# Patient Record
Sex: Male | Born: 1965 | Race: Black or African American | Hispanic: No | Smoking: Never smoker
Health system: Southern US, Community
[De-identification: ages and names within clinical notes are randomized; demographics above are authoritative.]

## PROBLEM LIST (undated history)

## (undated) DIAGNOSIS — I471 Supraventricular tachycardia, unspecified: Secondary | ICD-10-CM

## (undated) DIAGNOSIS — I428 Other cardiomyopathies: Secondary | ICD-10-CM

## (undated) DIAGNOSIS — I82409 Acute embolism and thrombosis of unspecified deep veins of unspecified lower extremity: Secondary | ICD-10-CM

## (undated) DIAGNOSIS — I5042 Chronic combined systolic (congestive) and diastolic (congestive) heart failure: Secondary | ICD-10-CM

## (undated) DIAGNOSIS — Z6841 Body Mass Index (BMI) 40.0 and over, adult: Secondary | ICD-10-CM

## (undated) DIAGNOSIS — S31109A Unspecified open wound of abdominal wall, unspecified quadrant without penetration into peritoneal cavity, initial encounter: Secondary | ICD-10-CM

## (undated) DIAGNOSIS — I513 Intracardiac thrombosis, not elsewhere classified: Secondary | ICD-10-CM

## (undated) DIAGNOSIS — I872 Venous insufficiency (chronic) (peripheral): Secondary | ICD-10-CM

## (undated) DIAGNOSIS — I472 Ventricular tachycardia, unspecified: Secondary | ICD-10-CM

## (undated) DIAGNOSIS — I2510 Atherosclerotic heart disease of native coronary artery without angina pectoris: Secondary | ICD-10-CM

## (undated) DIAGNOSIS — I2699 Other pulmonary embolism without acute cor pulmonale: Secondary | ICD-10-CM

## (undated) DIAGNOSIS — K5792 Diverticulitis of intestine, part unspecified, without perforation or abscess without bleeding: Secondary | ICD-10-CM

## (undated) DIAGNOSIS — J189 Pneumonia, unspecified organism: Secondary | ICD-10-CM

## (undated) DIAGNOSIS — K759 Inflammatory liver disease, unspecified: Secondary | ICD-10-CM

## (undated) DIAGNOSIS — M1990 Unspecified osteoarthritis, unspecified site: Secondary | ICD-10-CM

## (undated) DIAGNOSIS — Z973 Presence of spectacles and contact lenses: Secondary | ICD-10-CM

## (undated) DIAGNOSIS — R760 Raised antibody titer: Secondary | ICD-10-CM

## (undated) HISTORY — DX: Other pulmonary embolism without acute cor pulmonale: I26.99

## (undated) HISTORY — DX: Intracardiac thrombosis, not elsewhere classified: I51.3

## (undated) HISTORY — DX: Unspecified osteoarthritis, unspecified site: M19.90

## (undated) HISTORY — PX: JOINT REPLACEMENT: SHX530

## (undated) HISTORY — PX: PATELLAR TENDON REPAIR: SHX737

---

## 2005-01-13 DIAGNOSIS — I82409 Acute embolism and thrombosis of unspecified deep veins of unspecified lower extremity: Secondary | ICD-10-CM

## 2005-01-13 HISTORY — DX: Acute embolism and thrombosis of unspecified deep veins of unspecified lower extremity: I82.409

## 2005-08-31 ENCOUNTER — Emergency Department (HOSPITAL_COMMUNITY): Admission: EM | Admit: 2005-08-31 | Discharge: 2005-08-31 | Payer: Self-pay | Admitting: Emergency Medicine

## 2005-09-03 ENCOUNTER — Encounter: Admission: RE | Admit: 2005-09-03 | Discharge: 2005-09-03 | Payer: Self-pay | Admitting: Orthopedic Surgery

## 2005-09-09 ENCOUNTER — Ambulatory Visit (HOSPITAL_COMMUNITY): Admission: RE | Admit: 2005-09-09 | Discharge: 2005-09-09 | Payer: Self-pay | Admitting: Orthopedic Surgery

## 2005-11-14 ENCOUNTER — Emergency Department (HOSPITAL_COMMUNITY): Admission: EM | Admit: 2005-11-14 | Discharge: 2005-11-14 | Payer: Self-pay | Admitting: Emergency Medicine

## 2005-11-14 ENCOUNTER — Ambulatory Visit (HOSPITAL_COMMUNITY): Admission: RE | Admit: 2005-11-14 | Discharge: 2005-11-14 | Payer: Self-pay

## 2005-11-14 ENCOUNTER — Encounter (INDEPENDENT_AMBULATORY_CARE_PROVIDER_SITE_OTHER): Payer: Self-pay | Admitting: *Deleted

## 2005-11-17 ENCOUNTER — Ambulatory Visit: Payer: Self-pay | Admitting: Family Medicine

## 2005-12-05 ENCOUNTER — Ambulatory Visit: Payer: Self-pay | Admitting: Family Medicine

## 2005-12-15 ENCOUNTER — Ambulatory Visit: Payer: Self-pay | Admitting: Family Medicine

## 2005-12-18 ENCOUNTER — Inpatient Hospital Stay (HOSPITAL_COMMUNITY): Admission: AD | Admit: 2005-12-18 | Discharge: 2005-12-25 | Payer: Self-pay | Admitting: Internal Medicine

## 2005-12-18 ENCOUNTER — Ambulatory Visit: Payer: Self-pay | Admitting: Cardiology

## 2005-12-18 ENCOUNTER — Ambulatory Visit: Payer: Self-pay | Admitting: Family Medicine

## 2005-12-19 ENCOUNTER — Encounter: Payer: Self-pay | Admitting: Cardiology

## 2005-12-29 ENCOUNTER — Ambulatory Visit: Payer: Self-pay | Admitting: Family Medicine

## 2006-01-05 ENCOUNTER — Ambulatory Visit: Payer: Self-pay | Admitting: Family Medicine

## 2006-01-26 ENCOUNTER — Ambulatory Visit: Payer: Self-pay | Admitting: Family Medicine

## 2006-03-04 ENCOUNTER — Ambulatory Visit: Payer: Self-pay | Admitting: Family Medicine

## 2006-04-07 ENCOUNTER — Ambulatory Visit: Payer: Self-pay | Admitting: Family Medicine

## 2006-05-07 ENCOUNTER — Ambulatory Visit: Payer: Self-pay | Admitting: Family Medicine

## 2006-05-18 ENCOUNTER — Ambulatory Visit: Payer: Self-pay | Admitting: Cardiology

## 2006-06-14 HISTORY — PX: CARDIAC CATHETERIZATION: SHX172

## 2006-06-15 ENCOUNTER — Ambulatory Visit: Payer: Self-pay | Admitting: Family Medicine

## 2006-06-17 ENCOUNTER — Ambulatory Visit: Payer: Self-pay

## 2006-06-17 ENCOUNTER — Encounter: Payer: Self-pay | Admitting: Cardiology

## 2006-06-19 ENCOUNTER — Ambulatory Visit: Payer: Self-pay

## 2006-06-30 ENCOUNTER — Ambulatory Visit: Payer: Self-pay | Admitting: Cardiology

## 2006-06-30 LAB — CONVERTED CEMR LAB
Basophils Relative: 0.6 % (ref 0.0–1.0)
CO2: 29 meq/L (ref 19–32)
Creatinine, Ser: 0.9 mg/dL (ref 0.4–1.5)
Glucose, Bld: 91 mg/dL (ref 70–99)
HCT: 45.1 % (ref 39.0–52.0)
Hemoglobin: 15 g/dL (ref 13.0–17.0)
INR: 3.4 — ABNORMAL HIGH (ref 0.9–2.0)
MCHC: 33.3 g/dL (ref 30.0–36.0)
Monocytes Absolute: 0.6 10*3/uL (ref 0.2–0.7)
Neutrophils Relative %: 65.9 % (ref 43.0–77.0)
Potassium: 3.9 meq/L (ref 3.5–5.1)
Prothrombin Time: 23.5 s — ABNORMAL HIGH (ref 10.0–14.0)
RDW: 12.9 % (ref 11.5–14.6)
Sodium: 140 meq/L (ref 135–145)

## 2006-07-06 ENCOUNTER — Inpatient Hospital Stay (HOSPITAL_BASED_OUTPATIENT_CLINIC_OR_DEPARTMENT_OTHER): Admission: RE | Admit: 2006-07-06 | Discharge: 2006-07-06 | Payer: Self-pay | Admitting: Cardiology

## 2006-07-06 ENCOUNTER — Ambulatory Visit: Payer: Self-pay | Admitting: Cardiology

## 2006-07-10 ENCOUNTER — Ambulatory Visit: Payer: Self-pay | Admitting: Family Medicine

## 2006-07-21 ENCOUNTER — Ambulatory Visit: Payer: Self-pay | Admitting: Family Medicine

## 2006-08-21 ENCOUNTER — Ambulatory Visit: Payer: Self-pay | Admitting: Family Medicine

## 2006-11-03 ENCOUNTER — Ambulatory Visit: Payer: Self-pay | Admitting: Family Medicine

## 2006-11-09 ENCOUNTER — Ambulatory Visit: Payer: Self-pay | Admitting: Family Medicine

## 2006-12-03 ENCOUNTER — Ambulatory Visit: Payer: Self-pay | Admitting: Family Medicine

## 2006-12-04 ENCOUNTER — Ambulatory Visit: Payer: Self-pay

## 2007-05-28 ENCOUNTER — Ambulatory Visit: Payer: Self-pay | Admitting: Cardiology

## 2007-06-02 ENCOUNTER — Ambulatory Visit: Payer: Self-pay

## 2007-06-02 ENCOUNTER — Encounter: Payer: Self-pay | Admitting: Cardiology

## 2007-07-07 ENCOUNTER — Ambulatory Visit: Payer: Self-pay | Admitting: Family Medicine

## 2008-02-21 ENCOUNTER — Ambulatory Visit: Payer: Self-pay | Admitting: Family Medicine

## 2008-03-20 ENCOUNTER — Ambulatory Visit: Payer: Self-pay | Admitting: Family Medicine

## 2008-04-20 ENCOUNTER — Ambulatory Visit: Payer: Self-pay | Admitting: Family Medicine

## 2008-06-02 DIAGNOSIS — I2699 Other pulmonary embolism without acute cor pulmonale: Secondary | ICD-10-CM

## 2008-06-02 DIAGNOSIS — I471 Supraventricular tachycardia, unspecified: Secondary | ICD-10-CM | POA: Insufficient documentation

## 2008-06-22 ENCOUNTER — Ambulatory Visit: Payer: Self-pay | Admitting: Family Medicine

## 2008-07-21 ENCOUNTER — Ambulatory Visit: Payer: Self-pay | Admitting: Cardiology

## 2008-07-21 DIAGNOSIS — R002 Palpitations: Secondary | ICD-10-CM

## 2008-07-24 LAB — CONVERTED CEMR LAB
BUN: 14 mg/dL (ref 6–23)
Basophils Relative: 0.4 % (ref 0.0–3.0)
Chloride: 102 meq/L (ref 96–112)
Creatinine, Ser: 1 mg/dL (ref 0.4–1.5)
Eosinophils Absolute: 0.2 10*3/uL (ref 0.0–0.7)
Glucose, Bld: 109 mg/dL — ABNORMAL HIGH (ref 70–99)
Hemoglobin: 14.2 g/dL (ref 13.0–17.0)
Lymphocytes Relative: 31.5 % (ref 12.0–46.0)
MCHC: 34.1 g/dL (ref 30.0–36.0)
MCV: 87.3 fL (ref 78.0–100.0)
Monocytes Absolute: 0.7 10*3/uL (ref 0.1–1.0)
Neutro Abs: 4.1 10*3/uL (ref 1.4–7.7)
RBC: 4.76 M/uL (ref 4.22–5.81)

## 2008-07-25 ENCOUNTER — Encounter: Payer: Self-pay | Admitting: Cardiology

## 2008-07-25 ENCOUNTER — Ambulatory Visit: Payer: Self-pay

## 2008-09-06 ENCOUNTER — Telehealth (INDEPENDENT_AMBULATORY_CARE_PROVIDER_SITE_OTHER): Payer: Self-pay | Admitting: *Deleted

## 2008-10-30 ENCOUNTER — Inpatient Hospital Stay (HOSPITAL_COMMUNITY): Admission: EM | Admit: 2008-10-30 | Discharge: 2008-11-02 | Payer: Self-pay | Admitting: Emergency Medicine

## 2008-11-06 ENCOUNTER — Ambulatory Visit: Payer: Self-pay | Admitting: Family Medicine

## 2008-11-09 ENCOUNTER — Ambulatory Visit: Payer: Self-pay | Admitting: Cardiology

## 2008-11-29 ENCOUNTER — Ambulatory Visit: Payer: Self-pay | Admitting: Internal Medicine

## 2008-11-29 DIAGNOSIS — R0609 Other forms of dyspnea: Secondary | ICD-10-CM

## 2008-11-29 DIAGNOSIS — R0989 Other specified symptoms and signs involving the circulatory and respiratory systems: Secondary | ICD-10-CM

## 2008-12-08 ENCOUNTER — Ambulatory Visit (HOSPITAL_BASED_OUTPATIENT_CLINIC_OR_DEPARTMENT_OTHER): Admission: RE | Admit: 2008-12-08 | Discharge: 2008-12-08 | Payer: Self-pay | Admitting: Internal Medicine

## 2008-12-08 ENCOUNTER — Encounter: Payer: Self-pay | Admitting: Internal Medicine

## 2008-12-12 ENCOUNTER — Ambulatory Visit: Payer: Self-pay | Admitting: Pulmonary Disease

## 2009-04-13 ENCOUNTER — Ambulatory Visit: Payer: Self-pay | Admitting: Family Medicine

## 2009-05-02 ENCOUNTER — Ambulatory Visit: Payer: Self-pay | Admitting: Family Medicine

## 2009-05-02 ENCOUNTER — Encounter: Admission: RE | Admit: 2009-05-02 | Discharge: 2009-05-02 | Payer: Self-pay | Admitting: Family Medicine

## 2009-05-22 ENCOUNTER — Telehealth: Payer: Self-pay | Admitting: Cardiology

## 2009-05-24 ENCOUNTER — Telehealth: Payer: Self-pay | Admitting: Cardiology

## 2009-06-13 ENCOUNTER — Telehealth: Payer: Self-pay | Admitting: Cardiology

## 2009-06-14 ENCOUNTER — Encounter: Admission: RE | Admit: 2009-06-14 | Discharge: 2009-06-14 | Payer: Self-pay | Admitting: Family Medicine

## 2009-06-14 ENCOUNTER — Ambulatory Visit: Payer: Self-pay | Admitting: Family Medicine

## 2009-07-13 ENCOUNTER — Ambulatory Visit: Payer: Self-pay | Admitting: Family Medicine

## 2009-08-07 ENCOUNTER — Ambulatory Visit: Payer: Self-pay | Admitting: Cardiology

## 2009-08-13 ENCOUNTER — Ambulatory Visit (HOSPITAL_COMMUNITY): Admission: RE | Admit: 2009-08-13 | Discharge: 2009-08-13 | Payer: Self-pay | Admitting: Cardiology

## 2009-08-13 ENCOUNTER — Ambulatory Visit: Payer: Self-pay

## 2009-08-13 ENCOUNTER — Ambulatory Visit: Payer: Self-pay | Admitting: Internal Medicine

## 2009-08-16 ENCOUNTER — Ambulatory Visit: Payer: Self-pay | Admitting: Family Medicine

## 2009-08-22 ENCOUNTER — Ambulatory Visit: Payer: Self-pay | Admitting: Family Medicine

## 2009-09-21 ENCOUNTER — Ambulatory Visit: Payer: Self-pay | Admitting: Family Medicine

## 2009-10-16 ENCOUNTER — Ambulatory Visit: Payer: Self-pay | Admitting: Family Medicine

## 2009-12-17 ENCOUNTER — Ambulatory Visit: Payer: Self-pay | Admitting: Family Medicine

## 2009-12-17 ENCOUNTER — Telehealth (INDEPENDENT_AMBULATORY_CARE_PROVIDER_SITE_OTHER): Payer: Self-pay | Admitting: *Deleted

## 2009-12-21 ENCOUNTER — Ambulatory Visit: Payer: Self-pay | Admitting: Cardiology

## 2009-12-21 ENCOUNTER — Encounter: Payer: Self-pay | Admitting: Cardiology

## 2009-12-21 DIAGNOSIS — I238 Other current complications following acute myocardial infarction: Secondary | ICD-10-CM | POA: Insufficient documentation

## 2009-12-24 LAB — CONVERTED CEMR LAB
Basophils Absolute: 0 10*3/uL (ref 0.0–0.1)
Calcium: 8.6 mg/dL (ref 8.4–10.5)
Creatinine, Ser: 1 mg/dL (ref 0.4–1.5)
Eosinophils Absolute: 0.2 10*3/uL (ref 0.0–0.7)
GFR calc non Af Amer: 104.24 mL/min (ref >60.00)
Glucose, Bld: 98 mg/dL (ref 70–99)
Hemoglobin: 13.7 g/dL (ref 13.0–17.0)
Lymphocytes Relative: 25.2 % (ref 12.0–46.0)
Monocytes Relative: 9.2 % (ref 3.0–12.0)
Neutro Abs: 5.3 10*3/uL (ref 1.4–7.7)
Neutrophils Relative %: 63.2 % (ref 43.0–77.0)
Platelets: 151 10*3/uL (ref 150.0–400.0)
RDW: 13.8 % (ref 11.5–14.6)
Sodium: 141 meq/L (ref 135–145)

## 2010-01-01 ENCOUNTER — Ambulatory Visit: Payer: Self-pay | Admitting: Family Medicine

## 2010-01-11 ENCOUNTER — Ambulatory Visit
Admission: RE | Admit: 2010-01-11 | Discharge: 2010-01-11 | Payer: Self-pay | Source: Home / Self Care | Attending: Family Medicine | Admitting: Family Medicine

## 2010-02-12 NOTE — Assessment & Plan Note (Signed)
Summary: 8wk f/u sl   Referring Provider:  Olga Millers, MD Primary Provider:  Sharlot Gowda, MD  CC:  follow up.  History of Present Illness: Roberto [name] is a very pleasant gentleman who I have seen inthe past for cardiomyopathy as well as SVT.  I initially saw h im in December of 2007.  At that time he had had knee surgery and suffered a deep venous thrombosis with a subsequent pulmonary embolus.  While he was in the hospital he had an episode of SVT.  An echocardiogram was performed and his ejection fraction was reduced.  However, we were treating the patient for his pulmonary embolus and did not pursue any further ischemia evaluation at that point.  He did have an outpatient Myoview on June 17, 2006.  His ejection fraction was 32% and there was scar in the anterior wall, apex and inferior wall with mild peri-infarct ischemia. Because of the abnormal Myoview, he ultimately underwent cardiac catheterization, which performed on July 06, 2006.  His coronaries were normal. Last echocardiogram in in August of 2011 revealed an ejection fraction of 45% and apical thrombus; moderate LAE. Previously seen by Roberto [name] for consideration of SVT ablation but elected medical therapy. I last saw him in July of 2011. Since then, the patient denies any dyspnea on exertion, orthopnea, PND,  palpitations, syncope or chest pain. Mild pedal edema.   Current Medications (verified): 1)  Carvedilol 12.5 Mg Tabs (Carvedilol) .... One Twice A Day 2)  Cozaar 25 Mg Tabs (Losartan Potassium) .[name].. 1 Tab By Mouth Once Daily 3)  Furosemide 40 Mg Tabs (Furosemide) .... Take One Tablet By Mouth Daily. 4)  Potassium Chloride Cr 10 Meq Cr-Caps (Potassium Chloride) .... Take One Tablet By Mouth Daily 5)  Warfarin Sodium 10 Mg Tabs (Warfarin Sodium) .... As Directed  Allergies: No Known Drug Allergies  Past History:  Past Medical History: SUPRAVENTRICULAR TACHYCARDIA  Nonischemic CM, NYHA Class I/II CHF Obesity H/o DVT  after knee surgery and subsequent PTE 2007 Apical thrombus    Past Surgical History: Reviewed history from 11/29/2008 and no changes required. L patella repair  Social History: Reviewed history from 11/29/2008 and no changes required. Pt lives in [place].  The patient is married with five offspring.  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  He rarely drinks alcohol.  Review of Systems       no fevers or chills, productive cough, hemoptysis, dysphasia, odynophagia, melena, hematochezia, dysuria, hematuria, rash, seizure activity, orthopnea, PND, pedal edema, claudication. Remaining systems are negative.   Vital Signs:  Patient profile:   45 year old male Height:      68 inches Weight:      338 pounds BMI:     51.58 Pulse rate:   73 / minute Resp:     14 per minute BP sitting:   112 / 80  (left arm)  Vitals Entered By: Roberto [name] (December 21, 2009 8:36 AM)  Physical Exam  General:  Well-developed obese in no acute distress.  Skin is warm and dry.  HEENT is normal.  Neck is supple. No thyromegaly.  Chest is clear to auscultation with normal expansion.  Cardiovascular exam is regular rate and rhythm.  Abdominal exam nontender or distended. No masses palpated. Extremities show 1+ edema. neuro grossly intact    EKG  Procedure date:  12/21/2009  Findings:      Sinus rhythm with left axis  deviation.  Impression & Recommendations:  Problem # 1:  SUPRAVENTRICULAR TACHYCARDIA, HX OF (ICD-V12.59) Pt is not having SVT at present. Continue beta blocker. Will consider referral back to Roberto [name] in the future if his symptoms worsen for ablation. The following medications were removed from the medication list:    Verapamil Hcl 80 Mg Tabs (Verapamil hcl) ..... One by mouth three times a day as needed for svt His updated medication list for this problem includes:    Carvedilol 12.5 Mg Tabs (Carvedilol) .....  One twice a day    Warfarin Sodium 10 Mg Tabs (Warfarin sodium) .[name]... As directed  Problem # 2:  CARDIOMYOPATHY (ICD-425.4) Continue beta blocker, ARB and diuretic. Check potassium and renal function. The following medications were removed from the medication list:    Verapamil Hcl 80 Mg Tabs (Verapamil hcl) ..... One by mouth three times a day as needed for svt His updated medication list for this problem includes:    Carvedilol 12.5 Mg Tabs (Carvedilol) ..... One twice a day    Cozaar 25 Mg Tabs (Losartan potassium) .[name]... 1 tab by mouth once daily    Furosemide 40 Mg Tabs (Furosemide) .[name]... Take one tablet by mouth daily.    Warfarin Sodium 10 Mg Tabs (Warfarin sodium) .[name]... As directed  Problem # 3:  OBESITY, MORBID (ICD-278.01) He has been referred for a nutrition consult.  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 Check CBC.  Other Orders: TLB-BMP (Basic Metabolic Panel-BMET) (80048-METABOL) TLB-CBC Platelet - w/Differential (85025-CBCD)  Patient Instructions: 1)  Your physician recommends that you schedule a follow-up appointment in: 6 months with Dr. [name] 2)  Your physician recommends that you return for lab work in: CBC and BMET today

## 2010-02-12 NOTE — Progress Notes (Signed)
Summary: req appt  Phone Note Call from Patient Call back at [phone]   Caller: Patient Reason for Call: Talk to Nurse Summary of Call: wife has notice some different breathing while sleeping, also some sweating.... request appt asap Initial call taken by: Migdalia Dk,  June 13, 2009 8:06 AM  Follow-up for Phone Call        spoke with pt, he states his wife has noticed that when he is sleeping his breathing is irregular and he has been sweating alot. he also states he has congestion and a dry cough. he denies SOB when lying down or with exerction. he does report some swelling in his feet. he does not weigh daily. appt made with the pa 07/04/09. pt encouraged to call his primary care md because he states he recently had pneumonia but the cough is different than before, it is dry not wet. pt to call with change in symptoms or problems prior to appt. pt voiced understanding Deliah Goody, RN  June 13, 2009 9:15 AM

## 2010-02-12 NOTE — Progress Notes (Signed)
Summary: sob while walking  Phone Note Call from Patient Call back at [facility] Phone [phone]   Caller: Patient Reason for Call: Talk to Nurse Summary of Call: per pt calling c/o sob  while walking. no chestpain. pt on new b/p meds verapamil 80 mg.  Initial call taken by: Lorne Skeens,  May 24, 2009 4:35 PM  Follow-up for Phone Call        had svt on Sunday on Monday, called office and was told to start taking verapamil three times a day, which he started on Tue night, and states he feels it is causing slight sob which he read is a side effect.  Advised pt his directions stated to take as needed for SVt so if he wasn't having symptoms of svt not to take med he is agreeable Meredith Staggers, RN  May 24, 2009 5:49 PM

## 2010-02-12 NOTE — Progress Notes (Signed)
Summary: Faxed records to [facility] at [facility].  Faxed records to [place] at [facility]. LOV, EKG, LABS, & ECHO FAX: [phone] PHONE: [phone] Roberto [name]  December 17, 2009 12:02 PM

## 2010-02-12 NOTE — Assessment & Plan Note (Signed)
Summary: rov/irregular breathing/dm   Visit Type:  Follow-up Referring Provider:  Olga Millers, MD Primary Provider:  Sharlot Gowda, MD   History of Present Illness: Mr. Colley is a very pleasant gentleman who I have seen inthe past for cardiomyopathy as well as SVT.  I initially saw h im in December of 2007.  At that time he had had knee surgery and suffered a deep venous thrombosis with a subsequent pulmonary embolus.  While he was in the hospital he had an episode of SVT.  An echocardiogram was performed and his ejection fraction was reduced.  However, we were treating the patient for his pulmonary embolus and did not pursue any further ischemia evaluation at that point.  He did have an outpatient Myoview on June 17, 2006.  His ejection fraction was 32% and there was scar in the anterior wall, apex and inferior wall with mild peri-infarct ischemia. Because of the abnormal Myoview, he ultimately underwent cardiac catheterization, which performed on July 06, 2006.  His coronaries were normal. Last echocardiogram in in July of 2010 revealed an ejection fraction of 35-40% and mild left atrial enlargement. Note previous echocardiogram in 2008 suggested possible old calcified thrombus.  I last saw him in OCT 2010. He was having symptoms that I felt was consistent with recurrent SVT. I referred him to Dr. Johney Frame for consideration of ablation. However he elected to continue with medical therapy. Since then he continues to have bouts of palpitations. They are sudden in onset and described as his heart racing. They last approximately 15 minutes and resolve spontaneously. He also was recently placed on diuretics were increased shortness of breath. Those symptoms have now improved. He denies any chest pain.  Current Medications (verified): 1)  Carvedilol 6.25 Mg Tabs (Carvedilol) .... Take One Tablet By Mouth Twice A Day 2)  Cozaar 25 Mg Tabs (Losartan Potassium) .[name].. 1 Tab By Mouth Once Daily 3)  Verapamil Hcl  80 Mg Tabs (Verapamil Hcl) .... One By Mouth Three Times A Day As Needed For Svt 4)  Furosemide 40 Mg Tabs (Furosemide) .... Take One Tablet By Mouth Daily. 5)  Potassium Chloride Cr 10 Meq Cr-Caps (Potassium Chloride) .... Take One Tablet By Mouth Daily  Allergies: No Known Drug Allergies  Past History:  Past Medical History: Reviewed history from 11/29/2008 and no changes required. SUPRAVENTRICULAR TACHYCARDIA  Nonischemic CM, NYHA Class I/II CHF Obesity H/o DVT after knee surgery and subsequent PTE 2007    Past Surgical History: Reviewed history from 11/29/2008 and no changes required. L patella repair  Social History: Reviewed history from 11/29/2008 and no changes required. Pt lives in [place].  The patient is married with five offspring.  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  He rarely drinks alcohol.  Review of Systems       no fevers or chills, productive cough, hemoptysis, dysphasia, odynophagia, melena, hematochezia, dysuria, hematuria, rash, seizure activity, orthopnea, PND, pedal edema, claudication. Remaining systems are negative.   Vital Signs:  Patient profile:   45 year old male Height:      68 inches Weight:      322 pounds BMI:     49.14 Pulse rate:   65 / minute BP sitting:   142 / 72  (left arm)  Vitals Entered By: Laurance Flatten CMA (August 07, 2009 10:47 AM)  Physical Exam  General:  Well-developed obese in no acute distress.  Skin is warm and  dry.  HEENT is normal.  Neck is supple. No thyromegaly.  Chest is clear to auscultation with normal expansion.  Cardiovascular exam is regular rate and rhythm.  Abdominal exam nontender or distended. No masses palpated. Extremities show trace edema. neuro grossly intact    EKG  Procedure date:  08/07/2009  Findings:      Sinus rhythm at rate 65. Occasional PVC. Left axis deviation. Biatrial enlargement. Nonspecific ST  changes.  Impression & Recommendations:  Problem # 1:  SUPRAVENTRICULAR TACHYCARDIA, HX OF (ICD-V12.59) Patient continues to have symptoms of SVT. He takes verapamil as needed. Increase Coreg to 12.5 mg p.o. b.i.d. He will discuss ablation with his wife. If he is agreeable he will contact Dr. Johney Frame to arrange. The following medications were removed from the medication list:    Warfarin Sodium 10 Mg Tabs (Warfarin sodium) ..... Use as directed by anticoagulation clinic His updated medication list for this problem includes:    Carvedilol 12.5 Mg Tabs (Carvedilol) ..... One twice a day    Verapamil Hcl 80 Mg Tabs (Verapamil hcl) ..... One by mouth three times a day as needed for svt  The following medications were removed from the medication list:    Warfarin Sodium 10 Mg Tabs (Warfarin sodium) ..... Use as directed by anticoagulation clinic His updated medication list for this problem includes:    Carvedilol 12.5 Mg Tabs (Carvedilol) ..... One twice a day    Verapamil Hcl 80 Mg Tabs (Verapamil hcl) ..... One by mouth three times a day as needed for svt  Problem # 2:  CARDIOMYOPATHY (ICD-425.4) Continue present medications. Increase Coreg to 12.5 mg p.o. b.i.d. I will see him back in 8 weeks and we will increase his ARB. Repeat echocardiogram. The following medications were removed from the medication list:    Warfarin Sodium 10 Mg Tabs (Warfarin sodium) ..... Use as directed by anticoagulation clinic His updated medication list for this problem includes:    Carvedilol 12.5 Mg Tabs (Carvedilol) ..... One twice a day    Cozaar 25 Mg Tabs (Losartan potassium) .[name]... 1 tab by mouth once daily    Verapamil Hcl 80 Mg Tabs (Verapamil hcl) ..... One by mouth three times a day as needed for svt    Furosemide 40 Mg Tabs (Furosemide) .[name]... Take one tablet by mouth daily.  The following medications were removed from the medication list:    Warfarin Sodium 10 Mg Tabs (Warfarin sodium) ..... Use as  directed by anticoagulation clinic His updated medication list for this problem includes:    Carvedilol 12.5 Mg Tabs (Carvedilol) ..... One twice a day    Cozaar 25 Mg Tabs (Losartan potassium) .[name]... 1 tab by mouth once daily    Verapamil Hcl 80 Mg Tabs (Verapamil hcl) ..... One by mouth three times a day as needed for svt    Furosemide 40 Mg Tabs (Furosemide) .[name]... Take one tablet by mouth daily.  Orders: Echocardiogram (Echo)  Problem # 3:  OBESITY, MORBID (ICD-278.01)  Problem # 4:  PE (ICD-415.19) Pt now off Coumadin. The following medications were removed from the medication list:    Warfarin Sodium 10 Mg Tabs (Warfarin sodium) ..... Use as directed by anticoagulation clinic  Other Orders: EKG w/ Interpretation (93000)  Patient Instructions: 1)  Your physician recommends that you schedule a follow-up appointment in: 8 weeks with Dr [name] 2)  Your physician has recommended you make the following change in your medication: Increase Carvedilol to 12.5 mg twice a day 3)  Your physician has requested that you have an echocardiogram.  Echocardiography is a painless test that uses sound waves to create images of your heart. It provides your doctor with information about the size and shape of your heart and how well your heart's chambers and valves are working.  This procedure takes approximately one hour. There are no restrictions for this procedure. Prescriptions: CARVEDILOL 12.5 MG TABS (CARVEDILOL) one twice a day  #180 x 3   Entered by:   Charolotte Capuchin, RN   Authorized by:   Ferman Hamming, MD, [facility]   Signed by:   Charolotte Capuchin, RN on 08/07/2009   Method used:   Electronically to        [facility] [phone]* (retail)       [address]       [place], [place]  [address]       Ph: [phone]       Fax: [phone]   RxID:   [id number]

## 2010-02-12 NOTE — Progress Notes (Signed)
Summary: palps  Phone Note Call from Patient Call back at [phone]   Caller: Patient Reason for Call: Talk to Nurse Summary of Call: having more episodes of palpations, request call back Initial call taken by: Migdalia Dk,  May 22, 2009 2:23 PM  Follow-up for Phone Call        spoke with pt, he was calling to report he had 3 episodes of rapid heart beat close together. sunday the episode lasted about 15min, he had 2 episodes yesterday  after exercise. the 1st lasted 20min and the 2nd lasted about 1 hour. he felt tired after each episode. dr allred had given the pt a script for verapamil to take at the start of the episodes but the pt never got the med filled. will let dr crenshaw know Debra Mathis, RN  May 22, 2009 3:08 PM   Additional Follow-up for Phone Call Additional follow up Details #1::        take verapamil as directed; if he wants ablation, forward to dr. allred. Brian Saunders Crenshaw, MD, FACC  May 22, 2009 3:49 PM  pt aware, will call with further problems Debra Mathis, RN  May 22, 2009 4:27 PM     Prescriptions: VERAPAMIL HCL 80 MG TABS (VERAPAMIL HCL) one by mouth three times a day as needed for SVT  #45 x 6   Entered by:   Debra Mathis, RN   Authorized by:   Brian Saunders Crenshaw, MD, FACC   Signed by:   Debra Mathis, RN on 05/22/2009   Method used:   Electronically to        [id number]* (retail)       [address]       [place], [place]  [address]       Ph: [phone]       Fax: [phone]   RxID:   [id number]

## 2010-02-12 NOTE — Cardiovascular Report (Signed)
Summary: Hand Drawn Heart Diagram  Hand Drawn Heart Diagram   Imported By: Roderic Ovens 01/17/2009 12:42:04  _____________________________________________________________________  External Attachment:    Type:   Image     Comment:   External Document

## 2010-02-15 ENCOUNTER — Ambulatory Visit (INDEPENDENT_AMBULATORY_CARE_PROVIDER_SITE_OTHER): Payer: BC Managed Care – PPO | Admitting: Family Medicine

## 2010-02-15 DIAGNOSIS — L0291 Cutaneous abscess, unspecified: Secondary | ICD-10-CM

## 2010-02-15 DIAGNOSIS — Z7901 Long term (current) use of anticoagulants: Secondary | ICD-10-CM

## 2010-03-26 ENCOUNTER — Ambulatory Visit (INDEPENDENT_AMBULATORY_CARE_PROVIDER_SITE_OTHER): Payer: BC Managed Care – PPO | Admitting: Family Medicine

## 2010-03-26 DIAGNOSIS — L0390 Cellulitis, unspecified: Secondary | ICD-10-CM

## 2010-03-26 DIAGNOSIS — Z7901 Long term (current) use of anticoagulants: Secondary | ICD-10-CM

## 2010-04-18 LAB — DIFFERENTIAL
Basophils Absolute: 0 10*3/uL (ref 0.0–0.1)
Basophils Absolute: 0 10*3/uL (ref 0.0–0.1)
Basophils Relative: 0 % (ref 0–1)
Basophils Relative: 0 % (ref 0–1)
Basophils Relative: 0 % (ref 0–1)
Eosinophils Absolute: 0 10*3/uL (ref 0.0–0.7)
Eosinophils Absolute: 0.2 10*3/uL (ref 0.0–0.7)
Eosinophils Relative: 0 % (ref 0–5)
Eosinophils Relative: 0 % (ref 0–5)
Lymphocytes Relative: 6 % — ABNORMAL LOW (ref 12–46)
Lymphocytes Relative: 7 % — ABNORMAL LOW (ref 12–46)
Lymphs Abs: 1.1 10*3/uL (ref 0.7–4.0)
Monocytes Absolute: 0.7 10*3/uL (ref 0.1–1.0)
Monocytes Absolute: 1.3 10*3/uL — ABNORMAL HIGH (ref 0.1–1.0)
Monocytes Relative: 4 % (ref 3–12)
Monocytes Relative: 9 % (ref 3–12)
Neutro Abs: 12 10*3/uL — ABNORMAL HIGH (ref 1.7–7.7)
Neutro Abs: 14.5 10*3/uL — ABNORMAL HIGH (ref 1.7–7.7)
Neutrophils Relative %: 84 % — ABNORMAL HIGH (ref 43–77)
Neutrophils Relative %: 88 % — ABNORMAL HIGH (ref 43–77)

## 2010-04-18 LAB — COMPREHENSIVE METABOLIC PANEL
ALT: 15 U/L (ref 0–53)
Alkaline Phosphatase: 32 U/L — ABNORMAL LOW (ref 39–117)
BUN: 9 mg/dL (ref 6–23)
CO2: 27 mEq/L (ref 19–32)
Calcium: 8.3 mg/dL — ABNORMAL LOW (ref 8.4–10.5)
GFR calc non Af Amer: >60 mL/min (ref >60)
Glucose, Bld: 95 mg/dL (ref 70–99)
Potassium: 3.8 mEq/L (ref 3.5–5.1)
Sodium: 141 mEq/L (ref 135–145)

## 2010-04-18 LAB — CBC
HCT: 39.8 % (ref 39.0–52.0)
HCT: 40.6 % (ref 39.0–52.0)
HCT: 40.7 % (ref 39.0–52.0)
Hemoglobin: 13.3 g/dL (ref 13.0–17.0)
Hemoglobin: 13.5 g/dL (ref 13.0–17.0)
Hemoglobin: 13.6 g/dL (ref 13.0–17.0)
Hemoglobin: 13.8 g/dL (ref 13.0–17.0)
MCHC: 33.3 g/dL (ref 30.0–36.0)
MCHC: 33.3 g/dL (ref 30.0–36.0)
MCHC: 33.8 g/dL (ref 30.0–36.0)
MCV: 88.8 fL (ref 78.0–100.0)
MCV: 89.4 fL (ref 78.0–100.0)
Platelets: 152 10*3/uL (ref 150–400)
RBC: 4.45 MIL/uL (ref 4.22–5.81)
RBC: 4.54 MIL/uL (ref 4.22–5.81)
RBC: 4.54 MIL/uL (ref 4.22–5.81)
RBC: 4.58 MIL/uL (ref 4.22–5.81)
RBC: 5.08 MIL/uL (ref 4.22–5.81)
RDW: 13.6 % (ref 11.5–15.5)
WBC: 18.4 10*3/uL — ABNORMAL HIGH (ref 4.0–10.5)
WBC: 9.8 10*3/uL (ref 4.0–10.5)

## 2010-04-18 LAB — BASIC METABOLIC PANEL
BUN: 10 mg/dL (ref 6–23)
CO2: 29 mEq/L (ref 19–32)
Calcium: 8.7 mg/dL (ref 8.4–10.5)
Chloride: 103 mEq/L (ref 96–112)
Creatinine, Ser: 1.24 mg/dL (ref 0.4–1.5)
GFR calc Af Amer: >60 mL/min (ref >60)
GFR calc non Af Amer: >60 mL/min (ref >60)
Glucose, Bld: 113 mg/dL — ABNORMAL HIGH (ref 70–99)
Potassium: 3.5 mEq/L (ref 3.5–5.1)
Sodium: 141 mEq/L (ref 135–145)

## 2010-04-18 LAB — PROTIME-INR
INR: 3.65 — ABNORMAL HIGH (ref 0.00–1.49)
Prothrombin Time: 31.5 seconds — ABNORMAL HIGH (ref 11.6–15.2)

## 2010-04-18 LAB — URINALYSIS, ROUTINE W REFLEX MICROSCOPIC
Leukocytes, UA: NEGATIVE
Nitrite: NEGATIVE
Specific Gravity, Urine: 1.034 — ABNORMAL HIGH (ref 1.005–1.030)
Urobilinogen, UA: 1 mg/dL (ref 0.0–1.0)
pH: 6 (ref 5.0–8.0)

## 2010-04-18 LAB — CULTURE, BLOOD (ROUTINE X 2): Culture: NO GROWTH

## 2010-04-18 LAB — C-REACTIVE PROTEIN: CRP: 9 mg/dL — ABNORMAL HIGH (ref <0.6)

## 2010-04-18 LAB — SEDIMENTATION RATE: Sed Rate: 10 mm/hr (ref 0–16)

## 2010-04-18 LAB — URINE MICROSCOPIC-ADD ON

## 2010-04-22 ENCOUNTER — Ambulatory Visit: Payer: BC Managed Care – PPO

## 2010-05-03 ENCOUNTER — Other Ambulatory Visit: Payer: BC Managed Care – PPO

## 2010-05-13 ENCOUNTER — Other Ambulatory Visit (INDEPENDENT_AMBULATORY_CARE_PROVIDER_SITE_OTHER): Payer: BC Managed Care – PPO

## 2010-05-13 DIAGNOSIS — Z7901 Long term (current) use of anticoagulants: Secondary | ICD-10-CM

## 2010-05-16 ENCOUNTER — Ambulatory Visit (INDEPENDENT_AMBULATORY_CARE_PROVIDER_SITE_OTHER): Payer: BC Managed Care – PPO | Admitting: Family Medicine

## 2010-05-16 DIAGNOSIS — I498 Other specified cardiac arrhythmias: Secondary | ICD-10-CM

## 2010-05-17 ENCOUNTER — Encounter: Payer: Self-pay | Admitting: Physician Assistant

## 2010-05-17 ENCOUNTER — Ambulatory Visit (INDEPENDENT_AMBULATORY_CARE_PROVIDER_SITE_OTHER): Payer: BC Managed Care – PPO | Admitting: Physician Assistant

## 2010-05-17 VITALS — BP 96/68 | HR 59 | Ht 68.0 in | Wt 321.0 lb

## 2010-05-17 DIAGNOSIS — I471 Supraventricular tachycardia: Secondary | ICD-10-CM

## 2010-05-17 DIAGNOSIS — I428 Other cardiomyopathies: Secondary | ICD-10-CM

## 2010-05-17 DIAGNOSIS — I498 Other specified cardiac arrhythmias: Secondary | ICD-10-CM

## 2010-05-17 DIAGNOSIS — I238 Other current complications following acute myocardial infarction: Secondary | ICD-10-CM

## 2010-05-17 LAB — BASIC METABOLIC PANEL
BUN: 16 mg/dL (ref 6–23)
CO2: 29 mEq/L (ref 19–32)
Chloride: 103 mEq/L (ref 96–112)
Glucose, Bld: 91 mg/dL (ref 70–99)
Potassium: 3.8 mEq/L (ref 3.5–5.1)

## 2010-05-17 NOTE — Assessment & Plan Note (Signed)
Patient having more and more episodes of SVT.  He is responding to Verapamil PRN.  BP and HR too low to increase beta blocker.  Discussed ablation with him and he is now interested.  Will refer back to Dr. Johney Frame.  Check BMET and TSH today.  He does not drink much caffeine or use any supplements or OTC cold meds.

## 2010-05-17 NOTE — Patient Instructions (Signed)
Your physician recommends that you schedule a follow-up appointment in: 2 WEEKS WITH DR. ALLRED AS PER DR. ALLRED, IF NEED T CALL Dennis Bast, RN TO SET UP APPT.   Your physician recommends that you return for lab work in: TODAY TSH, BMET 427.89, 425.4...[name]..  Your physician recommends that you continue on your current medications as directed. Please refer to the Current Medication list given to you today.

## 2010-05-17 NOTE — Progress Notes (Signed)
History of Present Illness: Primary Cardiologist: Dr. Olga Millers  Roberto [name] is a 46 y.o. male with a h/o cardiomyopathy as well as SVT.  He was initially evaluated by Dr. [name] in 2007.  At that time, he had had knee surgery and suffered a DVT with a subsequent pulmonary embolus.  While he was in the hospital, he had an episode of SVT.  An echocardiogram was performed and his ejection fraction was reduced.  He had an outpatient Myoview on June 17, 2006.  His ejection fraction was 32% and there was scar in the anterior wall, apex and inferior wall with mild peri-infarct ischemia.  He ultimately underwent cardiac catheterization, which was performed on July 06, 2006.  His coronaries were normal.  Last echocardiogram in August of 2011 revealed an ejection fraction of 45% and apical thrombus; moderate LAE.  He was previously seen by Dr. Johney Frame for consideration of SVT ablation but elected medical therapy.    Over the last week, he has had several more episodes of SVT.  He is under some stress as his mother has been admitted to the hospital recently with bilateral pulmonary emboli.  He denies any heavy caffeine use or OTC cold medicine use.  He is taking verapamil a few times with prompt resolution of his SVT.  His palpitations are rapid and consistent with his prior history.  He had some chest discomfort on one day.  Yesterday, he felt as though he may pass out.  He denies any frank syncope.  He saw his PCP who suggested that he return to see [place] today.  He denies orthopnea, PND.  He does have chronic lower extremity edema on the right.  He denies fevers, chills, cough, melena, hematochezia, hematuria.  Past Medical History  Diagnosis Date  . NICM (nonischemic cardiomyopathy)     a. Myoview 6/08: EF 32%, ant/apical/inf scar w/ mild peri-infarct ischemia;  b. cath 6/08: normal cors;  c. echo 8/11: EF 45%, apical thrombus, mod LAE  . Mural thrombus of heart     coumadin  . Pulmonary embolism    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  . SVT (supraventricular tachycardia)     Current Outpatient Prescriptions  Medication Sig Dispense Refill  . carvedilol (COREG) 12.5 MG tablet Take 12.5 mg by mouth 2 (two) times daily with a meal.        . furosemide (LASIX) 40 MG tablet Take 40 mg by mouth daily.        [name] losartan (COZAAR) 25 MG tablet Take 25 mg by mouth daily.        . nitroGLYCERIN (NITROSTAT) 0.4 MG SL tablet Place 0.4 mg under the tongue every 5 (five) minutes as needed.        . potassium chloride (KLOR-CON) 10 MEQ CR tablet Take 10 mEq by mouth daily.        . verapamil (CALAN) 80 MG tablet Take 80 mg by mouth as needed. Take only with break through SVT       . warfarin (COUMADIN) 10 MG tablet Take 10 mg by mouth as directed.          Allergies not on file  History  Substance Use Topics  . Smoking status: Never Smoker   . Smokeless tobacco: Not on file  . Alcohol Use: Yes    Family History  Problem Relation Age of Onset  . Hypertension Mother   . Clotting disorder Mother     mom with PEs  .  Diabetes Father   . Heart attack Father     Vital Signs: BP 96/68  Pulse 59  Ht 5\' 8"  (1.727 m)  Wt 321 lb (145.605 kg)  BMI 48.81 kg/m2  PHYSICAL EXAM: Well nourished, well developed, in no acute distress HEENT: normal Neck: no JVD Vascular: No carotid bruits Endocrine: No thyromegaly Cardiac:  normal S1, S2; RRR; no murmur Lungs:  clear to auscultation bilaterally, no wheezing, rhonchi or rales Abd: soft, nontender, no hepatomegaly Ext: Trace edema on the right, no edema on the left Skin: warm and dry Neuro:  CNs 2-12 intact, no focal abnormalities noted  EKG:  Sinus bradycardia, heart rate 59, left axis deviation, poor R wave progression, nonspecific ST-T wave changes, no significant change when compared to prior tracings  ASSESSMENT AND PLAN:

## 2010-05-17 NOTE — Assessment & Plan Note (Signed)
Good medical regimen.  Volume stable.

## 2010-05-17 NOTE — Assessment & Plan Note (Signed)
Remains on coumadin 

## 2010-05-28 NOTE — Cardiovascular Report (Signed)
NAMELAURO, Roberto [name]                ACCOUNT NO.:  [id number]   MEDICAL RECORD NO.:  [id number]          PATIENT TYPE:  OIB   LOCATION:  NA                           FACILITY:  [facility]   PHYSICIAN:  Salvadore Farber, MD  DATE OF BIRTH:  1965-02-22   DATE OF PROCEDURE:  07/06/2006  DATE OF DISCHARGE:                            CARDIAC CATHETERIZATION   PROCEDURES:  Coronary angiography.   INDICATIONS:  Mr. Prosise is a 45 year old gentleman who was found to have  an ejection fraction of 35-45% in December 2007 with a septal wall  motion abnormality.  Repeat echocardiogram earlier this month  demonstrated ejection fraction of 40-45% with akinesis of the apical  anteroseptal wall and hypokinesis of the periapical wall.  There was a  also question raised of a calcified to apical thrombus.  We proceeded to  exercise test with Cardiolite imaging.  He achieved The procedure he  received only 7 minutes of the standard Bruce protocol with a peak heart  rate of 166 beats per minute.  Ejection fraction was 32% with evidence  of fixed defect in the anterior wall and inferior wall and what was  called peri-infarct ischemia inferiorly.  Based on this he was felt to  likely have multivessel coronary disease and referred for coronary  angiography.   PROCEDURAL TECHNIQUE:  Informed consent was obtained.  Underwent 1%  lidocaine local anesthesia, a 4-French sheath was placed in the right  common femoral artery using the modified Seldinger technique.  Diagnostic angiography was performed using JL-4 and JR-4 catheters.  The  patient tolerated the procedure well and was transferred to the holding  room in stable condition.  Sheath was removed there.   COMPLICATIONS:  None.   FINDINGS:  1. No left ventriculogram or left heart catheterization done due to      question of LV apical thrombus.  2. Left main:  Angiographically normal.  3. LAD:  Moderate-sized vessel giving rise to a small first and larger    second diagonal.  It is angiographically normal.  4. Circumflex:  Large vessel giving rise to three marginals.  It is      angiographically normal.  5. RCA:  Moderate-sized, dominant vessel.  It is angiographically      normal.   IMPRESSION/PLAN:  The patient has angiographically normal coronary  arteries.  Thus, it appears that he has a nonischemic cardiomyopathy.  We will continue his beta blocker and add ACE inhibitor.  Given his  coagulopathy with prior pulmonary embolus, will resume Lovenox and  Coumadin this evening with plans for follow-up in our Coumadin clinic on  Friday.      Salvadore Farber, MD  Electronically Signed     WED/MEDQ  D:  07/06/2006  T:  07/06/2006  Job:  [id number]   cc:   Madolyn Frieze. [name], MD, [facility]  Sharlot Gowda, M.D.

## 2010-05-28 NOTE — Assessment & Plan Note (Signed)
[facility]                            CARDIOLOGY OFFICE NOTE   Roberto, [name]                       MRN:          [id number]  DATE:05/28/2007                            DOB:          October 26, 1965    [name] is a very pleasant 45 year old gentleman who I have seen in  the past for cardiomyopathy as well as SVT.  I initially saw him in  December of 2007.  At that time he had had knee surgery and suffered a  deep venous thrombosis with a subsequent pulmonary embolus.  While he  was in the hospital he had an episode of SVT.  An echocardiogram was  performed and his ejection fraction was reduced.  However, we were  treating the patient for his pulmonary embolus and did not pursue any  further ischemia evaluation at that point.  He did have an outpatient  Myoview on June 17, 2006.  His ejection fraction was 32% and there was  scar in the anterior wall, apex and inferior wall with mild peri-infarct  ischemia.  A repeat echocardiogram on June 17, 2006 showed an ejection  fraction of 40-45% with akinesis of the apical inferoseptal wall.  There  was a small rounded sessile echodensity at the inferior apex suggestive  of a small calcified thrombus.  The left atrium was dilated.  Because of  the abnormal Myoview, he ultimately underwent cardiac catheterization,  which performed on July 06, 2006.  His coronaries were normal.  Since he  was last seen he denies any dyspnea, chest pain, palpitations or  syncope.  There is no pedal edema.   MEDICATIONS:  Include carvedilol 6.25 mg p.o. b.i.d., Coumadin as  directed and followed by Dr. Susann [name], and Benicar 10 mg p.o. daily.   PHYSICAL EXAM:  Today shows a blood pressure of 114/79.  His pulse is  81.  Weighs 316 pounds.  HEENT:  Normal.  NECK:  Supple.  CHEST:  Clear.  CARDIOVASCULAR:  Reveals a regular rhythm.  ABDOMEN:  Shows no tenderness.  EXTREMITIES:  Show no edema.   His electrocardiogram shows a sinus  rhythm at a rate of 78.  There is  left axis deviation and left atrial enlargement is noted, as well as  right atrial enlargement.  There are no significant ST changes.   DIAGNOSES:  1. History of cardiomyopathy - previous TSH was normal and his      coronaries were normal at the time of his recent catheterization.      The etiology of this is unclear.  We will plan to repeat his      echocardiogram.  For now we will continue his carvedilol as well as      his Benicar.  2. History of supraventricular tachycardia - he has had brief episodes      of supraventricular tachycardia in the past, but none in the past 6      months.  We will continue with his Coreg for now.  If he is worse      in the future, then we can consider an  electrophysiology evaluation      for ablation (he would most likely need an event monitor prior to      this).  3. History of pulmonary embolus following knee surgery - he is on      Coumadin and Dr. Susann [name] is following this.  Note he also has a      history of question apical thrombus on his echocardiogram.  We will      plan to review that.  4. Morbid obesity - he will need to continue to lose weight.   We will see him back in 1 year.  Dr. Susann [name] is following his  laboratories.     Roberto [name] Roberto Som, MD, [facility]  Electronically Signed    BSC/MedQ  DD: 05/28/2007  DT: 05/28/2007  Job #: [id number]

## 2010-05-28 NOTE — Assessment & Plan Note (Signed)
[facility]                            CARDIOLOGY OFFICE NOTE   Roberto [name], Roberto [name]                       MRN:          [id number]  DATE:06/30/2006                            DOB:          [birth year]    PRIMARY CARDIOLOGIST:  Dr. Olga [name]   PRIMARY CARE PHYSICIAN:  Dr. Sharlot [name].   PATIENT PROFILE:  A 45 year old African-American male with prior history  of SVT, DVT, and PE on Coumadin therapy, who presents following recent  abnormal exercise Myoview.   PROBLEM LIST:  1. Cardiomyopathy, presumably ischemic.      a.     December 2007, 2 D echocardiogram, ejection fraction 35-45%       with distal septal wall motion abnormalities.      b.     June 17, 2006, 2 D echocardiogram, ejection fraction 40-45%       with akinesis of the apical intraseptal wall, hypokinesis of the       periapical wall.  Grade 1 diastolic dysfunction.  In the small 10       x 12 mm rounded sessile echodensity, the inferior apex suggests a       small calcified thrombus.      c.     June 17, 2006, exercise Myoview, exercise time 7 minutes.       Maximum heart rate 166 beats per minute.  Mets achieved 8.7,       ejection fraction 32%, severe hypokinesis at inferior apical and       anterior walls.  Other walls are hypokinetic.  There was scar in       the anterior wall in the apex and inferior wall with mild peri-       infarct ischemia.  2. Left lower extremity deep venous thrombosis diagnosed November      2007.  3. Pulmonary embolism diagnosed December 2007, on Coumadin.  4. Morbid obesity.  5. Supraventricular tachycardia.  6. Traumatic knee injury in August 2007, status post surgery.   HISTORY OF PRESENT ILLNESS:  A 45 year old African-American male who was  last seen in clinic by Dr. [name] in May 2008.  The patient underwent  knee surgery in August 2007 after he was fighting off a [employer]  and fell, hurting his left knee.  Postoperatively, while in  rehab in  November 2007, he noted some swelling of the left calf and was evaluated  and found to have a DVT with superimposed cellulitis.  He was treated  with antibiotics and subsequently admitted to [facility] in December  2007, for IV antibiotics.  It was during that admission that he  experienced an episode of SVT which was managed medically.  He had some  dyspnea and subsequently underwent CT of the chest which was positive  for pulmonary embolism.  He had been on Coumadin since.  Because of his  SVT, a 2 D echocardiogram was performed in December which showed an EF  of 35-45%.  Further evaluation of this was put off considering the  reason diagnosis of pulmonary embolism.  He was placed  on Coreg therapy,  and repeat echocardiogram was performed June 4 and unfortunately showed  persistent low EF at 40-45%.  To further evaluate for possible causes of  this cardiomyopathy, an exercise Myoview was undertaken, and this showed  anterior apical and inferior scar with mild peri-infarct ischemia as  outlined above.  The patient presents back today to discuss the results  of his echocardiogram and Myoview and to discuss left heart cardiac  catheterization.   Despite the above, the patient is very active, lifting weights, using a  treadmill, elliptical or exercise bike 3-5 days per week for about 1  hour and 15 minutes per exercise.  He is able to do this without any  limitations, chest pain, shortness of breath, or palpitations.  He  denies any PND, orthopnea, dizziness, syncope, edema, or early satiety.  He tolerates his Coreg and Coumadin well.   ALLERGIES:  No known drug allergies.   HOME MEDICATIONS:  1. Coumadin as directed.  2. Coreg 6.25 mg b.i.d.   FAMILY HISTORY:  His mother is alive at age 39.  She is well.  Father  died at age 23 from complications of PE, diabetes, PVD, and apparently a  large MI.  He has 2 sisters and 1 brother who are alive and well.  One  of his sisters  he thinks has a problem with clotting.   SOCIAL HISTORY:  He lives in [place] and works as a Scientist, product/process development at [employer].  He denies any tobacco,  alcohol, or drug use.  He is exercising regularly as outlined above.   REVIEW OF SYSTEMS:  The patient denies any chest pain, shortness of  breath, fevers, chills, weight loss or gain, nausea, vomiting, diarrhea,  urinary difficulties.  All other systems reviewed and negative.   PHYSICAL EXAMINATION:  VITAL SIGNS:  His blood pressure is 98/76 with a  heart rate of 65, respirations 16.  He weighs 306 pounds, is down 4  pounds since his last visit.  GENERAL:  Pleasant African-American male in no acute distress.  Alert  and oriented x3.  NECK:  Normal carotid upstrokes.  No bruits or JVD.  LUNGS:  Respirations regular and unlabored.  Clear to auscultation.  CARDIAC:  Regular S1, S2.  No S3, S4, or murmurs.  ABDOMEN:  Round, soft, nontender, nondistended.  Bowel sounds present  x4.  EXTREMITIES:  Warm, dry; no clubbing, cyanosis, with trace left lower  extremity edema.  Dorsalis pedis and posterior tibial pulses 2+ in the  ankles bilaterally.  No femoral bruits are noted.  HEENT:  Normal.  NEUROLOGIC:  Grossly intact and nonfocal.   ACCESSORY CLINICAL FINDINGS:  CBC, BMET, PT/PTT, serum iron, and  ferritin are pending.   ASSESSMENT/PLAN:  1. Cardiomyopathy.  Question ischemic versus nonischemic.  He had      significant scar and akinesis and hypokinesis on his Myoview and      echocardiogram.  Ejection fraction is 40-45% by echocardiogram.  We      have discussed and have planned for a left heart cardiac      catheterization.  It is to be performed in the [facility] lab on Monday,      June 23.  As the patient is currently on Coumadin, we will plan to      bridge him with Lovenox.  He has been advised to discontinue his      Coumadin tonight and initiate Lovenox therapy starting this  Thursday.  He will not  take Lovenox Monday morning.  As he will be      done in the [facility] lab, we will have him come to the office about 8:15      that morning and have an INR checked prior to his catheterization.      We will otherwise obtain labs today.  Included in his labs, we will      check a serum iron and ferritin to rule out hemachromatosis.      Finally, we recognize he has borderline blood pressure but feel      that he would benefit from ACE inhibition.  We have had just a very      low dose lisinopril 2.5 mg daily.  2. History of deep venous thrombosis and pulmonary embolism as well as      left ventricular thrombus.  The patient is on Coumadin since      November.  His INRs are followed up by Dr. Susann Givens.  We are holding      Coumadin with bridging Lovenox as above.  3. Disposition.  We will have the patient follow up with Dr. [name]      in approximately 3 weeks.  Dr. [name] was in to see the patient      and discuss his plan with him as well.      Nicolasa Ducking, ANP  Electronically Signed      Madolyn Frieze. Roberto Som, MD, Roberto [name]  Electronically Signed   CB/MedQ  DD: 06/30/2006  DT: 06/30/2006  Job #: [id number]

## 2010-05-28 NOTE — Assessment & Plan Note (Signed)
[facility]                            CARDIOLOGY OFFICE NOTE   DASHAWN, BARTNICK                       MRN:          [id number]  DATE:05/18/2006                            DOB:          22-Oct-1965    Mr. Ferrin is a very pleasant 45 year old male who I initially evaluated  in December 2007. He had previous knee surgery and had suffered a DVT  with a subsequent pulmonary embolus.  While he was in the hospital he  did have an episode of SVT at a rate of approximately 200 beats per  minute.  He converted spontaneously.  Note, he also had an  echocardiogram during that hospitalization that showed mild LVE,  moderate LV dysfunction with an ejection fraction of 35% to 45% and a  distal septal wall motion abnormalities.  Echo wall abnormality. We did  not proceed with further cardiac evaluation at that time due to his  recent pulmonary embolus.  Since he was discharged, he has done well.  He has dyspnea if he exercises to a significant degree, but not with  routine activities. There is no orthopnea or PND.  He has mild pedal  edema.  He has not had chest pain nor recurrent palpitations.   His medications at present include:  1. Coreg 6.25 mg p.o. b.i.d.  2. Coumadin as directed which is being monitored by Dr. Susann Givens.   PHYSICAL EXAMINATION:  His blood pressure is 122/78 and his pulse is 75  and he weighs 310 pounds.  His neck is supple.  His chest is clear.  His cardiovascular exam is a regular rate and rhythm.  His extremities show trace edema bilaterally.   His electrocardiogram shows a sinus rhythm at a rate of 75.  There is  right atrial enlargement and left atrial enlargement as well.  There are  non-specific ST changes.   DIAGNOSES:  1. Cardiomyopathy - etiology of this is not clear to me.  Note that      his TSH in the hospital was normal.  We will schedule him to have a      Myoview for risk stratification and to exclude coronary disease  as      a cause of his cardiomyopathy.  I will also plan to repeat his      echocardiogram to see if his left ventricular function has      improved.  We will continue with his Coreg for now, but I will add      an ACE-inhibitor if his left ventricular function continues to be      reduced.  If it is indeed decreasing, ultimately he will most      likely require cardiac catheterization.  2. Saphenous vein graft - he will continue with his Coreg and there      has been no recurrences.  If there are recurrences in the future,      then we will consider a referral for ablation.  3. Recent pulmonary embolus following knee surgery - he will continue      on Coumadin with a goal line  of 2 to 3.  This is being followed by      Dr. Susann Givens.  4. Morbid obesity - the patient will need to continue to make efforts      to lose weight.   We will see him back in approximately 6 months if his Myoview shows no  ischemia.     Madolyn Frieze [name], MD, [facility]  Electronically Signed    BSC/MedQ  DD: 05/18/2006  DT: 05/18/2006  Job #: [id number]

## 2010-05-31 NOTE — Discharge Summary (Signed)
NAMEBREWSTER, Roberto [name]                ACCOUNT NO.:  [id number]   MEDICAL RECORD NO.:  [id number]          PATIENT TYPE:  INP   LOCATION:  1421                         FACILITY:  [facility]   PHYSICIAN:  Hillery Aldo, M.D.   DATE OF BIRTH:  1965-12-19   DATE OF ADMISSION:  12/18/2005  DATE OF DISCHARGE:  12/25/2005                               DISCHARGE SUMMARY   PRIMARY CARE PHYSICIAN:  Dr. Sharlot Gowda.   CARDIOLOGIST:  Dr. [name].   DISCHARGE DIAGNOSES:  1. Right lower extremity deep venous thrombosis.  2. Moderately severe pulmonary emboli, especially in the right lower      lobe  3. 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  4. Cardiomyopathy with depressed ejection fraction of 35-45%, severe      hypokinesis of the mid distal/septal wall and of the periapical      wall.  Mild left diffuse left ventricular hypokinesis.  5. Supraventricular tachycardia thought to be related to pulmonary      embolism.  6. Morbid obesity.  7. Cellulitis of the left lower extremity.  8. Bilateral knee osteoarthritis.   DISCHARGE MEDICATIONS:  1. Coumadin 10 mg daily, to be adjusted based on INR readings.  2. Augmentin 875 mg b.i.d. x3 more days.  3. Lovenox 150 mg subcutaneously q.12 h x48 hours to provide a      Coumadin/Lovenox bridge.  4. Coreg 6.25 mg b.i.d.   CONSULTATIONS:  Dr. [name], cardiology.   BRIEF ADMISSION HISTORY OF PRESENT ILLNESS:  For the full details of the  admission HPI, please see my original history and physical done on  December 18, 2005.  Briefly, this is a 45 year old male who developed a  left DVT after having arthroscopic debridement and repair of a left  patella tendon.  He presented to his primary care physician with  worsening swelling and erythema of the left lower extremity  and was  found to have a subtherapeutic INR.  Because of concerns of concurrent  cellulitis, he was admitted as a direct admission for further evaluation  and IV antibiotic therapy after failing outpatient doxycycline.   PROCEDURES AND DIAGNOSTIC STUDIES:  1. CT angiogram of the chest on December 19, 2005, showed a moderately      severe pulmonary emboli, especially in the right lower lobe.  There      was a large mass in the right lobe of the liver which was thought      to represent a hemangioma.  MRI of the liver without and with      contrast was recommended for further evaluation.  However, the      patient was too large to fit into the MRI scanner.  2. CT scan of the abdomen and lower extremities on December 19, 2005,      showed several tiny lesions in the lateral segment of the  left      hepatic lobe, technically too small to characterize, although      statistically likely to represent cyst.  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  There was a small focus      of edema signal in the immediate subcutaneous tissues of the left      anterior abdomen thought to represent a small focus of inflammation      or recent injection.  The left common femoral vein and superficial      femoral veins were visualized with DVT. Left lower extremity was      with generalized subcutaneous edema along with generalized edema      along the deep fascial planes of the thigh, likely the result of      the DVT.  No abscess was identified.  There was a left knee      effusion; small and bilateral knee osteoarthritis was also noted.  3. 2-D echocardiogram on December 19, 2005, showed a mildly dilated      left ventricle with an ejection fraction of 35-45%.  Mild diffuse      left ventricular hypokinesis.  Findings were suggestive of severe      hypokinesis of the mid distal septal wall.  They were also      suggestive of severe hypokinesis of the periapical wall.  Left       ventricular wall thickness was mildly increased.  Study was limited      due to the patient's body habitus and if clinically indicated, a      TEE or an MUGA would provide additional information.   DISCHARGE LABORATORY VALUES:  PT was 23, INR 2.0.  Sodium was 141,  potassium 4.0, chloride 106, bicarb 29, glucose 101, BUN 7, creatinine  1.0, calcium 8.7.  White blood cell count was 9.2, hemoglobin 12.7,  hematocrit 37.6, platelet count 296.   HOSPITAL COURSE BY PROBLEM:  #1 - VENOUS THROMBOEMBOLIC DISEASE WITH  LEFT LOWER EXTREMITY DEEP VENOUS THROMBOSIS AND RIGHT PULMONARY  EMBOLISM:  The patient was put on Coumadin as well as Lovenox.  His INR  remained subtherapeutic up until the date of discharge when it bumped to  2.0.  Nevertheless, I will discharge him on 10 mg daily with plans to  follow up with a PT/INR on Monday, December 29, 2005, at his primary  care physician's office.  He will receive 48 hours of a Lovenox bridge  while at home.  Given that this was a provoked DVT, I do not think he  will need more than 6 months of therapy with Coumadin, but consideration  for longer treatment duration can be made by his primary care physician.   #2 - CELLULITIS WITH POSSIBLE POST PHLEBOTIC SYNDROME:  The patient was  empirically put on Zosyn and has completed a 7-day course of treatment  with Zosyn.  We have switched him over to p.o. Augmentin, and he should  complete an additional 3 days of therapy to equal a total course of  treatment for 10 days.  His left lower extremity looks much better with  decreased erythema and swelling.   #3 - SUPRAVENTRICULAR TACHYCARDIA:  The patient did develop  supraventricular tachycardia, prompting a workup for pulmonary embolism  and a cardiology consultation.  The CT angiogram did confirm pulmonary  embolism, and the cardiologist did recommend further diagnostic testing with a 2-D echocardiogram.  Given his marked decrease  in LV function,  the  cardiologist planned to do an outpatient cardiac CT scan or  adenosine Myoview to rule out coronary artery disease and get a better  quantification of his left ventricular function.   #4 - CARDIOMYOPATHY:  As in previous problem, he was seen in  consultation with cardiologist with plan for further workup.  This time,  he is stable on Coreg and will follow up with cardiologist postdischarge  for further dose titration of his Coreg as tolerated.   #5 - MORBID OBESITY:  The patient was counseled on weight loss efforts  and is encouraged to talk to his primary care physician about the  possibility of getting into a formal dietary program or bariatric  surgery to control his weight.   DISPOSITION:  At this point, the patient is stable for discharge.  His  INR is therapeutic.  We will continue the 48-hour Lovenox bridge.  He  should follow up with his primary care physician early next week.  He  does have an appointment to have his labs drawn at his physician's  office at 11 a.m. on December 29, 2005.  He should call Dr. Ludwig [place]  office for a followup appointment with the cardiologist.      Hillery Aldo, M.D.  Electronically Signed     CR/MEDQ  D:  12/25/2005  T:  12/25/2005  Job:  [id number]   cc:   Sharlot Gowda, M.D.  Fax: [phone]   Madolyn Frieze. [name], MD, [facility]  [address]  [address]  [place]  [place] [address]

## 2010-05-31 NOTE — Consult Note (Signed)
Roberto [name], Roberto [name]                ACCOUNT NO.:  [id number]   MEDICAL RECORD NO.:  [id number]          PATIENT TYPE:  INP   LOCATION:  1421                         FACILITY:  [facility]   PHYSICIAN:  Madolyn Frieze. [name], MD, FACCDATE OF BIRTH:  [birth year]   DATE OF CONSULTATION:  DATE OF DISCHARGE:                                 CONSULTATION   ADDENDUM:  Following further review by Dr. Olga Millers, there was a  consideration of proceeding with placement of an IVC filter.  After  conferring with Dr. Marcelyn Bruins, however, it was felt that the patient  was hemodynamically and clinically stable and was not requiring high  levels of supplemental oxygen.  Therefore, given his current clinical  status, it was felt that placement of an IVC filter was not indicated at  the present time.      Roberto Serpe, PA-C      Madolyn Frieze. [name], MD, [facility]  Electronically Signed    GS/MEDQ  D:  12/20/2005  T:  12/20/2005  Job:  [id number]

## 2010-05-31 NOTE — H&P (Signed)
Roberto [name], Roberto [name]                ACCOUNT NO.:  [id number]   MEDICAL RECORD NO.:  [id number]          PATIENT TYPE:  INP   LOCATION:  1609                         FACILITY:  [facility]   PHYSICIAN:  Hillery Aldo, M.D.   DATE OF BIRTH:  03-17-1965   DATE OF ADMISSION:  12/18/2005  DATE OF DISCHARGE:                              HISTORY & PHYSICAL   PRIMARY CARE PHYSICIAN:  Dr. Sharlot Gowda.   CHIEF COMPLAINT:  Erythema and swelling of the left lower extremity.   HISTORY OF PRESENT ILLNESS:  The patient is a 45 year old male with a  past medical history of left patella tendon rupture status post  arthroscopic debridement and repair in August 2007, who developed a DVT  postoperatively.  He has been managed on Coumadin for this.  Over the  past week, the patient has complained of increasing pain and redness of  the left lower extremity.  He saw his primary care physician who put him  on doxycycline and after 3 days of therapy, has not had any relief and,  in fact, the swelling now has progressed up into the upper thigh.  The  patient had his INR checked 3 days ago and it was slightly  subtherapeutic prompting his physician to increase his Coumadin dose.  He is admitted for further evaluation workup of presumed cellulitis in  the setting of possible extension of his DVT.   PAST MEDICAL HISTORY:  1. Knee tendon rupture, status post repair in August 2007.  2. Morbid obesity.   ALLERGIES:  No known drug allergies.   CURRENT MEDICATIONS:  1. Coumadin 7.5 mg daily.  2. Doxycycline 100 mg b.i.d.   SOCIAL HISTORY:  The patient is married with five offspring.  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  He is a  lifelong nonsmoker.  He rarely drinks alcohol.   FAMILY HISTORY:  [name] mother is alive at 78 and has  hypertension.  Father died at 86 from complications of diabetes and  heart disease.  There is a question of possible cause of death being  pulmonary embolism.  He has three siblings, two are healthy and one has  hypertension, diabetes and obesity.   REVIEW OF SYSTEMS:  The patient denies any fever or chills.  Appetite is  normal.  Denies chest pain and shortness of breath.  He does have a dry  cough, but recently suffered from a viral upper respiratory infection.  [name] wife states that she noticed increased dyspnea on exertion  lately.  He does snore.  There has not been any changes in his bowel  habits.  No melena, hematochezia or dysuria.   PHYSICAL EXAMINATION:  VITAL SIGNS:  Temperature is 98.9, blood pressure  135/83, pulse 101, respirations 18, O2 saturation 95% on room air.  GENERAL:  This is a morbidly obese male who is in no acute distress.  HEENT:  Normocephalic, atraumatic.  PERRL.  EOMI.  Oropharynx clear.  NECK:  Supple, no thyromegaly, no lymphadenopathy, no jugular venous  distension.  CHEST:  Lungs clear to auscultation  bilaterally with good air movement.  HEART:  Tachycardiac rate, regular rhythm.  No murmurs, rubs, or  gallops.  ABDOMEN:  Soft, nontender, nondistended with normoactive bowel sounds.  EXTREMITIES:  There is a marked difference in his extremities when  compared.  His left lower extremity is noticeably swollen with marked  erythema that extends to the upper thigh.  Unable to palpate pedal  pulses bilaterally.  NEUROLOGIC:  Nonfocal.   LABORATORY DATA AND X-RAY FINDINGS:  PT is 17.6, INR 1.4.  Sodium is  139, potassium 3.9, chloride 101, bicarb 31, glucose 100, BUN 11,  creatinine 0.9.  Total bilirubin 0.9, alkaline phosphatase 42, AST 19,  ALT 14, total protein 7.3, albumin 3.3, calcium 9.4.  CBC shows a white  blood cell count of 12.6, hemoglobin 13.9, hematocrit 41.7, platelets  184 with an absolute neutrophil count of 9.8.   ASSESSMENT/PLAN:  1. Left lower extremity deep venous thrombosis with superimposed      cellulitis.  The patient likely has had extension of his deep       venous thrombosis given his subtherapeutic INR.  Given this, we      will start him on Lovenox at a therapeutic dose and bridge him back      into Coumadin therapy until he achieves a therapeutic INR.      Additionally, he has superimposed cellulitis and we will put him on      Zosyn empirically.  Given the concern for deeper infection, I will      also check an MRI scan to rule out any fasciitis or myositis.  He      may have infection and a thrombus as well.  I will repeat a lower      extremity Doppler study to see if there has been extension of his      deep venous thrombosis.  2. Tachycardia.  The patient is mildly tachycardiac and with the      wife's concerns for dyspnea on exertion, pulmonary embolism is      certainly a possibility.  If he has significant clot in the leg,      consideration could be made for obtaining a CT scan of the chest.      Since it is not going to emergently change our management, I will      not order this test unless specifically indicated.  3. Obesity.  Will obtain a dietary consultation for help with weight      loss management strategies.  4. Questionable obstructive sleep apnea/obesity hypoventilation      syndrome.  I have spoken with the patient about talking with his      doctor about setting up a sleep study at discharge.  5. Prophylaxis.  Initiate gastrointestinal prophylaxis with Protonix      and he will be fully anticoagulated which hopefully will prevent      any further problems of clotting.      Hillery Aldo, M.D.  Electronically Signed     CR/MEDQ  D:  12/18/2005  T:  12/18/2005  Job:  [id number]   cc:   Sharlot Gowda, M.D.  Fax: [phone]

## 2010-05-31 NOTE — Consult Note (Signed)
NAMEJONTEZ, REDFIELD                ACCOUNT NO.:  [id number]   MEDICAL RECORD NO.:  [id number]          PATIENT TYPE:  INP   LOCATION:  1421                         FACILITY:  [facility]   PHYSICIAN:  Madolyn Frieze. [name], MD, FACCDATE OF BIRTH:  [birth year]   DATE OF CONSULTATION:  12/20/2005  DATE OF DISCHARGE:                                 CONSULTATION   REFERRING PHYSICIAN:  Lonia Blood, M.D.   REASON FOR CONSULTATION:  SVT/cardiomyopathy.   Mr. Crochet is a 45 year old male, with no prior history of heart disease,  who was admitted here two days ago for further evaluation and management  of worsening swelling and erythema of the left leg. He presented on  Coumadin for treatment of postoperative DVT which developed  approximately 6-8 weeks after undergoing left knee surgery this past  August for repair of a ruptured tendon. He was on Coumadin but presented  with a subtherapeutic INR of 1.4.   The patient has since been found to have extensive DVT of the left lower  extremity by lower extremity CT angiogram as well as pulmonary emboli by  chest CT scan. He is on Coumadin with Lovenox overlap, and INR has  trended up to the current level of 1.6.   The patient had a 2-D echocardiogram which was performed yesterday and  suggested depressed ejection fraction at 35-45% with severe hypokinesis  of the mid distal/septal wall and severe hypokinesis of the periapical  wall and mild diffuse LV hypokinesis.   The patient also found to have supraventricular tachycardia at a rate of  approximately 200 BPM by EKG yesterday with spontaneous conversion to  normal sinus rhythm. He has been placed on Lopressor.   Since admission, the patient does report having experienced some  fluttering which was of brief duration yesterday with no significant  symptoms. Cardiac markers were drawn and notable for slightly elevated  troponins (peak 0.13) with normal CPK/MBs.   The patient presents with no known  history of hypertension, diabetes  mellitus, hyperlipidemia, or tobacco smoking.   ALLERGIES:  No known drug allergies.   HOME MEDICATIONS:  1. Coumadin 7.5 mg as directed.  2. Doxycycline 100 mg b.i.d.   PAST MEDICAL HISTORY:  1. Status post left knee repair August 2007. Secondary to ruptured      tendon.  2. Postoperative DVT.  3. Morbid obesity.   SOCIAL HISTORY:  The patient is married, five children. 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 He has never  smoked tobacco and drinks alcohol only on rare occasion.   FAMILY HISTORY:  Father deceased age 87, question pulmonary embolism;  history of diabetes and heart disease.   REVIEW OF SYSTEMS:  Recent development of mild exertional dyspnea over  the these past two weeks but otherwise negative for orthopnea or PND.  Significant for progressive swelling of the left lower extremity as  noted above. Denies any exertional angina pectoris. Did have an episode  of fluttering Thanksgiving Day, lasting approximately 15 minutes and  with no significant associated symptoms. Denies any recent evidence of  overt GI bleeding. Denies any presyncope/syncope. Otherwise as noted per  HPI. Remaining systems negative.   PHYSICAL EXAMINATION:  Blood pressure 100-110 systolic/60s diastolic,  pulse 16-10 currently, respirations 20s, temperature 97.4, saturations  95% on room air.  GENERAL:  A 45 year old male, morbidly obese, lying supine in no  apparent distress.  HEENT:  Normocephalic, atraumatic.  NECK:  Palpable bilateral carotid pulses without bruits, unable to  assess JVD secondary to neck girth.  LUNGS:  Without crackles or wheezes.  HEART:  Regular rate and rhythm (S1 and S2). No significant murmurs.  ABDOMEN:  Protuberant, nontender with intact vital signs.  EXTREMITIES:  Marked edema of the left lower extremity with minimal  palpable peripheral pulses and 2-3+ pitting edema of the lower  extremity. No  significant edema of the right lower extremity with  preserved pulses.  NEUROLOGICAL:  No focal deficits.   LABORATORY DATA:  WBC 11.8, hemoglobin 12.9, hematocrit 38.6, platelets  188. INR currently 1.6 (1.4 at admission). Sodium 137, potassium 3.9,  glucose 91, BUN 12, creatinine 1.0. Liver enzymes normal, albumin 3.3.  Cardiac enzyme:  Normal total CK-MB; troponin I 0.03, 0.13, and 0.11.   CT scan of the lower extremities:  Left common femoral vein/superficial  femoral vein DVT. CT scan of the pelvis:  Left external iliac/common  femoral vein DVT.   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   IMPRESSION:  1. Cardiomyopathy. Ejection fraction 35-45% by echocardiography.  2. Supraventricular tachycardia.  3. Bilateral pulmonary emboli.      a.     Left lower extremity deep vein thrombosis.      b.     Postoperative deep vein thrombosis diagnosed August 2007  4. Abnormal troponin. Suspect secondary to tachy arrhythmia.  5. Morbid obesity.  6. Question sleep apnea.   PLAN:  At this point, the main issue is the bilateral pulmonary  emboli/DVT, and therefore, recommend continued treatment with  anticoagulation and antibiotics. 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 Of note, we could consider  ablation in the future for symptoms which are refractory to medical  therapy, but only after the DVT/pulmonary emboli has been treated. Also,  the patient presents with possible  decreased left ventricular function, and we will plan on proceeding with  a cardiac CT scan or adenosine stress Myoview next week to rule out  coronary artery disease and better quantify the left ventricular  function. Again, we would not interrupt anticoagulation for further  cardiac workup at this point in time.     Gene Serpe, PA-C      Madolyn Frieze. [name], MD,  [facility]  Electronically Signed    GS/MEDQ  D:  12/20/2005  T:  12/20/2005  Job:  [id number]

## 2010-05-31 NOTE — Op Note (Signed)
[name], [name]                ACCOUNT NO.:  [id number]   MEDICAL RECORD NO.:  [id number]          PATIENT TYPE:  AMB   LOCATION:  DAY                          FACILITY:  [facility]   PHYSICIAN:  Madlyn Frankel. Charlann Boxer, M.D.  DATE OF BIRTH:  [birth year]   DATE OF PROCEDURE:  09/09/2005  DATE OF DISCHARGE:                                 OPERATIVE REPORT   PREOPERATIVE DIAGNOSIS:  Left patella tendon rupture with significant  patella tendinosis.   POSTOPERATIVE DIAGNOSIS:  Left patella tendon rupture with significant  patella tendinosis.   PROCEDURES:  1. Patella debridement.  2. Open primary repair of left patella tendon using two #2 fiber wires      through drill holes.   SURGEON:  Madlyn Frankel. Charlann Boxer, M.D.   ASSISTANT:  Cherly Beach   ANESTHESIA:  General.   BLOOD LOSS:  Minimal.   TOURNIQUET TIME:  55 minutes, 300 mmHg.   COMPLICATIONS:  None.   INDICATIONS FOR PROCEDURE:  Roberto [name] is a 45 year old male who presented  to the office on Monday.  He had wrestled of a large dog from his daughter  and poodle when he twisted, fell, heard a pop, had inability to bear weight  and use his leg.  He went to urgent care center where radiographs were  obtained.  He was sent to our office for evaluation.  Clinical examination  of palpable defect, inability for straight-leg-raise.  Radiographs revealed  patella alta.  Combination of these factors indicated clinical diagnosis  patellar rupture.  Given the mechanism of injury including twisting  mechanism, I did order an MRI of his knee stat.  MRI was ordered in an  effort to evaluate for any ACL tear or other injury inside the knee could be  addressed at the same time of surgery, i.e. torn meniscus, etc.   MRI revealed significant tendinosis changes within the patella tendon along  with rupture, only some degenerative changes to the meniscus but no obvious  tear.  Ligaments otherwise were stable.  I reviewed these findings with Mr.  [name] prior to surgical procedure indicating that the surgical procedure at  hand was to repair his patella tendon, nothing else.  Consent obtained after  reviewing risks and benefits including discussion of the postoperative  course.  Consent obtained.   PROCEDURE IN DETAIL:  The patient was brought to operative theater.  Once  adequate anesthesia and preoperative antibiotics, 1 gram Ancef, were  administered, the patient was positioned supine with a proximal thigh  tourniquet placed.  The left lower extremity was then prepped and draped in  sterile fashion, following a pre scrub.  The leg was exsanguinated,  tourniquet elevated.   Midline incision was made.  Sharp dissection was carried down and extensor  mechanism identifying the rupture.  The proximal pole patella was noted be  very degenerative with calcification within it.  This was debrided sharply.  I also debrided and cleaned up the inferior pole of patella in an effort to  create a bed for bony healing.  Once this was carried, I then placed two #2  FiberWire sutures in a [place] weave pattern through the patella tendon.   With this carried out there were four suture ends in the proximal end of the  tendon.  Three drill holes were then made through the patella, one medial,  one central and lateral.  Then using a suture passer I passed two central  sutures through the central hole and the medial and lateral sutures through  the medial lateral holes respectively.   Then using a tonsil clamp I brought the medial and lateral sutures to the  midline to prevent tying suture over tendon.  With tension applied to the  medial strands I reapproximated the lateral suture tying it down tautly.  I  did this with the knee in full extension with a bump underneath the heel.   Once this was tied down the medial sutures were reapproximated the same  fashion.  I then tied these two suture bands together in a single knot and  buried it underneath  the tendon tissue.   At this point and throughout the case, particularly initially, the wound was  irrigated, including the joint surface.  I reapproximated the retinacular  tissues medially and laterally which were also disrupted using a #1 Vicryl.  I also performed a pants over vest type of reapproximation of the  retinacular tissue over top of the reapproximation of the tendon to the  bone.   Once this was carried out the wound was again irrigated.  I reapproximated  subcu tissue using 2-0 Vicryl and a 4-0 Monocryl on the skin.  The skin was  cleaned, dried and dressed sterilely with Steri-Strips, dressing sponges,  tape.  The patient was brought to the recovery room in sterile bulky  dressing.   The plan will be for the patient to be in a knee immobilizer with a  consultation with [employer] to place him into a TROM brace locked in  extension.  I will try to do this prior to him discharged home.  Otherwise  see him back in the office at 2 weeks.  He will be weightbearing as  tolerated.  No motion of right lower extremity.      Madlyn Frankel Charlann Boxer, M.D.  Electronically Signed     MDO/MEDQ  D:  09/09/2005  T:  09/10/2005  Job:  [id number]

## 2010-06-05 ENCOUNTER — Ambulatory Visit: Payer: BC Managed Care – PPO | Admitting: Internal Medicine

## 2010-06-14 ENCOUNTER — Other Ambulatory Visit: Payer: BC Managed Care – PPO

## 2010-06-14 DIAGNOSIS — Z7901 Long term (current) use of anticoagulants: Secondary | ICD-10-CM

## 2010-06-14 LAB — PROTIME-INR: INR: 3 — ABNORMAL HIGH (ref <1.50)

## 2010-06-19 ENCOUNTER — Encounter: Payer: Self-pay | Admitting: Internal Medicine

## 2010-07-16 ENCOUNTER — Encounter: Payer: Self-pay | Admitting: Internal Medicine

## 2010-07-16 ENCOUNTER — Encounter: Payer: Self-pay | Admitting: Family Medicine

## 2010-07-18 ENCOUNTER — Ambulatory Visit: Payer: BC Managed Care – PPO | Admitting: Internal Medicine

## 2010-07-19 ENCOUNTER — Other Ambulatory Visit: Payer: BC Managed Care – PPO

## 2010-07-19 DIAGNOSIS — Z7901 Long term (current) use of anticoagulants: Secondary | ICD-10-CM

## 2010-07-20 LAB — PROTIME-INR
INR: 3.3 — ABNORMAL HIGH (ref <1.50)
Prothrombin Time: 34.6 seconds — ABNORMAL HIGH (ref 11.6–15.2)

## 2010-07-22 ENCOUNTER — Telehealth: Payer: Self-pay

## 2010-07-22 NOTE — Telephone Encounter (Signed)
PT INFORMED PT OK CONTINUE ON PRESENT DOSE AND RE-CHECK IN 1 MONTH

## 2010-07-25 ENCOUNTER — Encounter: Payer: Self-pay | Admitting: Internal Medicine

## 2010-08-31 ENCOUNTER — Other Ambulatory Visit: Payer: Self-pay | Admitting: Cardiology

## 2010-09-12 ENCOUNTER — Other Ambulatory Visit: Payer: Self-pay | Admitting: Family Medicine

## 2010-09-27 ENCOUNTER — Other Ambulatory Visit: Payer: BC Managed Care – PPO

## 2010-09-27 DIAGNOSIS — Z7901 Long term (current) use of anticoagulants: Secondary | ICD-10-CM

## 2010-09-27 LAB — PROTIME-INR: INR: 3.22 — ABNORMAL HIGH (ref <1.50)

## 2010-09-30 ENCOUNTER — Telehealth: Payer: Self-pay

## 2010-09-30 NOTE — Telephone Encounter (Signed)
Called pt to inform continue present dose and recheck in 1 mth

## 2010-10-10 ENCOUNTER — Other Ambulatory Visit: Payer: Self-pay | Admitting: Family Medicine

## 2010-10-15 ENCOUNTER — Encounter: Payer: Self-pay | Admitting: Family Medicine

## 2010-10-16 ENCOUNTER — Encounter: Payer: Self-pay | Admitting: Family Medicine

## 2010-12-14 ENCOUNTER — Other Ambulatory Visit: Payer: Self-pay | Admitting: Family Medicine

## 2010-12-16 ENCOUNTER — Other Ambulatory Visit: Payer: BC Managed Care – PPO

## 2010-12-16 DIAGNOSIS — Z7901 Long term (current) use of anticoagulants: Secondary | ICD-10-CM

## 2010-12-16 LAB — PROTIME-INR: Prothrombin Time: 29.6 seconds — ABNORMAL HIGH (ref 11.6–15.2)

## 2010-12-19 ENCOUNTER — Encounter: Payer: Self-pay | Admitting: Family Medicine

## 2010-12-19 ENCOUNTER — Ambulatory Visit (INDEPENDENT_AMBULATORY_CARE_PROVIDER_SITE_OTHER): Payer: BC Managed Care – PPO | Admitting: Family Medicine

## 2010-12-19 VITALS — BP 120/80 | HR 76 | Temp 98.7°F | Wt 325.0 lb

## 2010-12-19 DIAGNOSIS — J209 Acute bronchitis, unspecified: Secondary | ICD-10-CM

## 2010-12-19 MED ORDER — AMOXICILLIN ER 775 MG PO TB24: 775.0000 mg | ORAL_TABLET | Freq: Every day | ORAL | Status: AC

## 2010-12-19 NOTE — Patient Instructions (Signed)
Take the antibiotic every day and if you're not totally back to normal at the end of the antibiotic,give me a call

## 2010-12-19 NOTE — Progress Notes (Signed)
  Subjective:    Patient ID: Roberto [name], male    DOB: 1965/02/17, 45 y.o.   MRN: [id number]  HPI He has a three-day history that started with a headache followed by malaise and a slight cough that became productive the next day. He also has had some wheezing. He does not smoke and has no allergies. He continues on his Coumadin.  Review of Systems     Objective:   Physical Exam alert and in no distress. Tympanic membranes and canals are normal. Throat is clear. Tonsils are normal. Neck is supple without adenopathy or thyromegaly. Cardiac exam shows a regular sinus rhythm without murmurs or gallops. Lungs are clear to auscultation.        Assessment & Plan:  Bronchitis We'll treat him with Moxatag

## 2011-01-03 ENCOUNTER — Other Ambulatory Visit: Payer: Self-pay | Admitting: Family Medicine

## 2011-01-06 ENCOUNTER — Other Ambulatory Visit: Payer: Self-pay | Admitting: Family Medicine

## 2011-01-06 MED ORDER — WARFARIN SODIUM 10 MG PO TABS: 10.0000 mg | ORAL_TABLET | Freq: Every day | ORAL | Status: DC

## 2011-01-06 MED ORDER — FUROSEMIDE 40 MG PO TABS: 40.0000 mg | ORAL_TABLET | Freq: Every day | ORAL | Status: DC

## 2011-01-06 MED ORDER — POTASSIUM CHLORIDE 10 MEQ PO TBCR: 10.0000 meq | EXTENDED_RELEASE_TABLET | Freq: Every day | ORAL | Status: DC

## 2011-01-06 MED ORDER — LOSARTAN POTASSIUM 25 MG PO TABS: 25.0000 mg | ORAL_TABLET | Freq: Every day | ORAL | Status: DC

## 2011-01-06 MED ORDER — CARVEDILOL 12.5 MG PO TABS: 12.5000 mg | ORAL_TABLET | Freq: Two times a day (BID) | ORAL | Status: DC

## 2011-02-07 ENCOUNTER — Other Ambulatory Visit: Payer: Self-pay | Admitting: Family Medicine

## 2011-02-15 ENCOUNTER — Other Ambulatory Visit: Payer: Self-pay | Admitting: Family Medicine

## 2011-05-14 ENCOUNTER — Encounter: Payer: Self-pay | Admitting: Family Medicine

## 2011-05-14 ENCOUNTER — Ambulatory Visit (INDEPENDENT_AMBULATORY_CARE_PROVIDER_SITE_OTHER): Payer: BC Managed Care – PPO | Admitting: Family Medicine

## 2011-05-14 VITALS — BP 120/70 | HR 74 | Wt 331.0 lb

## 2011-05-14 DIAGNOSIS — W010XXA Fall on same level from slipping, tripping and stumbling without subsequent striking against object, initial encounter: Secondary | ICD-10-CM

## 2011-05-14 DIAGNOSIS — Z7901 Long term (current) use of anticoagulants: Secondary | ICD-10-CM

## 2011-05-14 DIAGNOSIS — S40022A Contusion of left upper arm, initial encounter: Secondary | ICD-10-CM

## 2011-05-14 DIAGNOSIS — S40029A Contusion of unspecified upper arm, initial encounter: Secondary | ICD-10-CM

## 2011-05-14 LAB — PROTIME-INR
INR: 3.01 — ABNORMAL HIGH (ref <1.50)
Prothrombin Time: 31.5 seconds — ABNORMAL HIGH (ref 11.6–15.2)

## 2011-05-14 NOTE — Patient Instructions (Signed)
Anytime you have an injury always use ice for 20 minutes 3 or 4 times a day as well as compression to the area. After about 72 hours you can switch to heat

## 2011-05-14 NOTE — Progress Notes (Signed)
  Subjective:    Patient ID: Roberto [name], male    DOB: 12-25-1965, 46 y.o.   MRN: [id number]  HPI Last Sunday he was moving a desk and it apparently slipped causing an injury to his left upper arm. He developed discoloration and swelling and is here for further evaluation. He continues on chronic Coumadin therapy and does need another PT/INR.  Review of Systems     Objective:   Physical Exam Ecchymotic area noted to the medial aspect of the left upper arm. No palpable lesions are present. Biceps muscle appears normal.      Assessment & Plan:   1. Encounter for long-term (current) use of anticoagulants  Protime-INR  2. Traumatic ecchymosis of upper arm, left, initial encounter    Anytime you have an injury always use ice for 20 minutes 3 or 4 times a day as well as compression to the area. After about 72 hours you can switch to heat

## 2011-05-15 ENCOUNTER — Ambulatory Visit: Payer: BC Managed Care – PPO | Admitting: Family Medicine

## 2011-07-10 ENCOUNTER — Telehealth: Payer: Self-pay | Admitting: Family Medicine

## 2011-07-10 NOTE — Telephone Encounter (Signed)
LM

## 2011-07-24 NOTE — Progress Notes (Signed)
It looks like his last PT/INR was in May. Have him get another PT/INR

## 2011-08-13 ENCOUNTER — Other Ambulatory Visit: Payer: Self-pay | Admitting: Family Medicine

## 2011-09-05 ENCOUNTER — Ambulatory Visit (INDEPENDENT_AMBULATORY_CARE_PROVIDER_SITE_OTHER): Payer: BC Managed Care – PPO | Admitting: Family Medicine

## 2011-09-05 VITALS — BP 102/74 | HR 82 | Temp 99.4°F | Resp 16 | Ht 67.0 in | Wt 336.4 lb

## 2011-09-05 DIAGNOSIS — IMO0002 Reserved for concepts with insufficient information to code with codable children: Secondary | ICD-10-CM

## 2011-09-05 DIAGNOSIS — S76319A Strain of muscle, fascia and tendon of the posterior muscle group at thigh level, unspecified thigh, initial encounter: Secondary | ICD-10-CM

## 2011-09-05 NOTE — Progress Notes (Signed)
  Subjective:    Patient ID: Roberto [name], male    DOB: [birth year], 46 y.o.   MRN: [id number]  HPI Pt was was walking on deck yesterday while it was raining. Pt slipped. Did involuntary split.  Has had severe L hamstring pain since this point. Pain worse with weight bearing to the point that pt has been having to use cane with ambulation. Pt noted to be morbidly obese.   Review of Systems See HPI, otherwise ROS negative     Objective:   Physical Exam Gen: up in chair, NAD, morbidly obese  HEENT: NCAT, EOMI CV: RRR, no murmurs auscultated PULM: CTAB, no wheezes, rales, rhoncii ABD: NT, morbidly obese  MSK: + pain and tenderness at proximal insertion point of L semimembranosus and semitendinosus.  + Pain with knee flexion.  No popliteal tenderness     Assessment & Plan:  L hamstring strain: RICE treatment and tylenol. Avoid NSAIDs in setting of coumadin use.  Hamstring wrapped at bedside.  Discussed general red flags for reevaluation and weight loss.

## 2011-09-09 ENCOUNTER — Ambulatory Visit (INDEPENDENT_AMBULATORY_CARE_PROVIDER_SITE_OTHER): Payer: BC Managed Care – PPO | Admitting: Family Medicine

## 2011-09-09 ENCOUNTER — Encounter: Payer: Self-pay | Admitting: Family Medicine

## 2011-09-09 VITALS — BP 130/80 | HR 85 | Wt 336.0 lb

## 2011-09-09 DIAGNOSIS — S7010XA Contusion of unspecified thigh, initial encounter: Secondary | ICD-10-CM

## 2011-09-09 DIAGNOSIS — Z7901 Long term (current) use of anticoagulants: Secondary | ICD-10-CM

## 2011-09-09 MED ORDER — TRAMADOL HCL 50 MG PO TABS: 50.0000 mg | ORAL_TABLET | Freq: Three times a day (TID) | ORAL | Status: AC | PRN

## 2011-09-09 NOTE — Progress Notes (Signed)
  Subjective:    Patient ID: Roberto [name], male    DOB: 29-Jun-1965, 46 y.o.   MRN: [id number]  HPI 5 days ago he slipped on some leaves on his deck at home causing him to do a split. He now mainly complains of left hamstring pain. He was seen in an urgent care Center. He continues on Coumadin for treatment of underlying PE.   Review of Systems     Objective:   Physical Exam The left posterior thigh area is quite ecchymotic and firm as well as painful to touch. No hematoma is noted. No calf tenderness.       Assessment & Plan:   1. Encounter for long-term (current) use of anticoagulants  Protime-INR  2. Traumatic ecchymosis of thigh  traMADol (ULTRAM) 50 MG tablet  Heat for 20 minutes 3 times per day. Tylenol for pain relief. I will also call in Tramadol. Call if any questions.

## 2011-09-09 NOTE — Patient Instructions (Addendum)
Heat for 20 minutes 3 times per day. Tylenol for pain relief. I will also call in Tramadol. Call if any questions.

## 2011-09-22 ENCOUNTER — Telehealth: Payer: Self-pay | Admitting: Family Medicine

## 2011-09-22 NOTE — Telephone Encounter (Signed)
Left pt message word for word  

## 2011-09-22 NOTE — Telephone Encounter (Signed)
Heat for 20 minutes 3 or 4 times a day . Tylenol mainly for pain

## 2011-10-07 ENCOUNTER — Other Ambulatory Visit: Payer: Self-pay | Admitting: Family Medicine

## 2011-10-13 ENCOUNTER — Other Ambulatory Visit: Payer: Self-pay | Admitting: Family Medicine

## 2011-10-13 ENCOUNTER — Telehealth: Payer: Self-pay | Admitting: Family Medicine

## 2011-10-13 ENCOUNTER — Ambulatory Visit
Admission: RE | Admit: 2011-10-13 | Discharge: 2011-10-13 | Disposition: A | Discharge status: Home / Self Care | Payer: BC Managed Care – PPO | Source: Ambulatory Visit | Attending: Medical | Admitting: Medical

## 2011-10-13 ENCOUNTER — Encounter: Payer: Self-pay | Admitting: Medical

## 2011-10-13 ENCOUNTER — Ambulatory Visit (INDEPENDENT_AMBULATORY_CARE_PROVIDER_SITE_OTHER): Payer: BC Managed Care – PPO | Admitting: Medical

## 2011-10-13 VITALS — BP 112/90 | HR 60 | Temp 98.5°F | Resp 16 | Wt 328.0 lb

## 2011-10-13 DIAGNOSIS — Z7901 Long term (current) use of anticoagulants: Secondary | ICD-10-CM

## 2011-10-13 DIAGNOSIS — R252 Cramp and spasm: Secondary | ICD-10-CM

## 2011-10-13 DIAGNOSIS — R609 Edema, unspecified: Secondary | ICD-10-CM

## 2011-10-13 DIAGNOSIS — R600 Localized edema: Secondary | ICD-10-CM

## 2011-10-13 DIAGNOSIS — Z86718 Personal history of other venous thrombosis and embolism: Secondary | ICD-10-CM

## 2011-10-13 DIAGNOSIS — M79609 Pain in unspecified limb: Secondary | ICD-10-CM

## 2011-10-13 DIAGNOSIS — M79605 Pain in left leg: Secondary | ICD-10-CM

## 2011-10-13 DIAGNOSIS — S76919A Strain of unspecified muscles, fascia and tendons at thigh level, unspecified thigh, initial encounter: Secondary | ICD-10-CM

## 2011-10-13 DIAGNOSIS — IMO0002 Reserved for concepts with insufficient information to code with codable children: Secondary | ICD-10-CM

## 2011-10-13 DIAGNOSIS — Z5181 Encounter for therapeutic drug level monitoring: Secondary | ICD-10-CM

## 2011-10-13 LAB — CBC
MCH: 27.9 pg (ref 26.0–34.0)
Platelets: 228 10*3/uL (ref 150–400)
RBC: 4.33 MIL/uL (ref 4.22–5.81)
WBC: 9.1 10*3/uL (ref 4.0–10.5)

## 2011-10-13 LAB — COMPREHENSIVE METABOLIC PANEL
Albumin: 3.7 g/dL (ref 3.5–5.2)
Alkaline Phosphatase: 37 U/L — ABNORMAL LOW (ref 39–117)
CO2: 29 mEq/L (ref 19–32)
Chloride: 105 mEq/L (ref 96–112)
Glucose, Bld: 119 mg/dL — ABNORMAL HIGH (ref 70–99)
Potassium: 4.6 mEq/L (ref 3.5–5.3)
Sodium: 140 mEq/L (ref 135–145)
Total Protein: 7.5 g/dL (ref 6.0–8.3)

## 2011-10-13 LAB — PROTIME-INR: Prothrombin Time: 38 seconds — ABNORMAL HIGH (ref 11.6–15.2)

## 2011-10-13 MED ORDER — HYDROCODONE-ACETAMINOPHEN 5-500 MG PO TABS: 1.0000 | ORAL_TABLET | Freq: Four times a day (QID) | ORAL | Status: DC | PRN

## 2011-10-13 NOTE — Addendum Note (Signed)
Addended by: Jac Canavan on: 10/13/2011 09:33 AM   Modules accepted: Orders

## 2011-10-13 NOTE — Progress Notes (Signed)
Subjective: Here for hamstring injury.  A month ago slipped on his deck, did a split, and pulled hamstrings.   Came in and saw Dr. Susann Givens, had medication, was using ice and heat, but lately having a lot of cramping in his legs.  Feels that legs tighten up for no reasons.  Has a lot of pain.   Is a Engineer, site, and feels the pain all day seated.   Wants something for pain and cramps.     Needs referral.  Has issues with fluid in left leg long term since prior DVT and leg surgery in 2007.  By end of the day his left leg is twice the size of the left leg due to swelling.  In the last few weeks since his recent leg injury/strain, having some increased warmth, worse swelling than usual, can't get shoe on if takes it off, and has pain from the increased swelling.    Sees cardiologist yearly.  They were considering ablation at last recheck.  Due for coumadin recheck today.     Past Medical History  Diagnosis Date  . Mural thrombus of heart     coumadin  . Pulmonary embolism     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  . SVT (supraventricular tachycardia)   . Obesity   . Arthritis   . Cardiomyopathy    ROS  Gen: no fever, chills, no unexpected weight changes Lungs: no c/o Heart: no cp, palpations, DOE Neuro: no numbness, tingling, weakness  Objective: Gen: obese AA male SKin: left lower leg with patchy purplish discoloration suggestive of venous stasis disease or ecchymosis, not well defined, warmth of the left lower leg Heart: RRR, normal s1, s2, no murmurs Lungs: CTA MSK: tender left posterior leg over  Hamstring, pain with left leg ROM in the hamstring, pain with resisted hip extension and knee extension and flexion, but right leg nontender normal ROM Pulses: 1+ LE Ext: left lower leg asymmetrically swollen and tight with 1+ pitting edema compared to right   Assessment: Encounter Diagnoses  Name Primary?  . Muscle strain of thigh Yes  . History of DVT (deep vein  thrombosis)   . Leg edema, left   . Leg pain, left   . Muscle cramp   . Encounter for monitoring coumadin therapy    Plan: Gave instruction on home therapy exercises including using resistance bands, stretching, c/t ice/heat to help heal left hamstring back to normal  Hx/o DVT - on coumadin  Leg pain  And edema - left leg ultrasound given today's exam findings  Muscle cramp - labs today  Coumadin therapy - PT/INR today. Last INR therapeutic on 09/09/11.

## 2011-10-13 NOTE — Telephone Encounter (Signed)
Patient is aware of his appointment at [facility] on 10/13/11 @ 315 pm. CLS

## 2011-10-14 ENCOUNTER — Other Ambulatory Visit: Payer: Self-pay | Admitting: Medical

## 2011-10-14 MED ORDER — DOXYCYCLINE HYCLATE 100 MG PO TABS: 100.0000 mg | ORAL_TABLET | Freq: Two times a day (BID) | ORAL | Status: DC

## 2011-10-14 MED ORDER — WARFARIN SODIUM 5 MG PO TABS: 5.0000 mg | ORAL_TABLET | Freq: Every day | ORAL | Status: DC

## 2011-10-14 MED ORDER — WARFARIN SODIUM 4 MG PO TABS: 4.0000 mg | ORAL_TABLET | Freq: Every day | ORAL | Status: DC

## 2011-10-15 NOTE — Addendum Note (Signed)
Addended by: Leretha Dykes L on: 10/15/2011 11:16 AM   Modules accepted: Orders

## 2011-10-21 ENCOUNTER — Telehealth: Payer: Self-pay | Admitting: Family Medicine

## 2011-10-21 NOTE — Telephone Encounter (Signed)
PATIENT IS AWARE OF HIS APPOINTMENT AT [facility] ON 11/04/11 @ 1130 AM WITH DR. EARLY. CLS [phone]

## 2011-10-28 ENCOUNTER — Other Ambulatory Visit: Payer: Self-pay

## 2011-10-28 DIAGNOSIS — I83893 Varicose veins of bilateral lower extremities with other complications: Secondary | ICD-10-CM

## 2011-10-28 DIAGNOSIS — I872 Venous insufficiency (chronic) (peripheral): Secondary | ICD-10-CM

## 2011-11-03 ENCOUNTER — Encounter: Payer: Self-pay | Admitting: Vascular Surgery

## 2011-11-04 ENCOUNTER — Encounter: Payer: Self-pay | Admitting: Vascular Surgery

## 2011-11-04 ENCOUNTER — Encounter (INDEPENDENT_AMBULATORY_CARE_PROVIDER_SITE_OTHER): Payer: BC Managed Care – PPO | Admitting: *Deleted

## 2011-11-04 ENCOUNTER — Ambulatory Visit (INDEPENDENT_AMBULATORY_CARE_PROVIDER_SITE_OTHER): Payer: BC Managed Care – PPO | Admitting: Vascular Surgery

## 2011-11-04 VITALS — BP 113/78 | HR 65 | Resp 16 | Ht 68.0 in | Wt 320.0 lb

## 2011-11-04 DIAGNOSIS — I872 Venous insufficiency (chronic) (peripheral): Secondary | ICD-10-CM

## 2011-11-04 DIAGNOSIS — R609 Edema, unspecified: Secondary | ICD-10-CM

## 2011-11-04 DIAGNOSIS — I83893 Varicose veins of bilateral lower extremities with other complications: Secondary | ICD-10-CM

## 2011-11-04 NOTE — Progress Notes (Signed)
Subjective:     Patient ID: Roberto [name], male   DOB: [birth year], 46 y.o.   MRN: [id number]  HPI this 46 year old male patient has a history of DVT in the left leg in 2008. He then had another episode of DVT in 2010 and a pulmonary embolism. He is now on chronic Coumadin therapy. He had a torn hamstring in the left leg recently which exacerbated the edema. He is referred today for further evaluation of the left leg edema. He had a venous ultrasound study performed at [facility] radiology in August of 23rd same and I reviewed that result. It revealed deep venous insufficiency due to previous DVT. He has no history of stasis ulcers. It is not wear elastic compression stockings. He does not elevate his legs on a regular basis.   Past Medical History  Diagnosis Date  . Mural thrombus of heart     coumadin  . Pulmonary embolism     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  . SVT (supraventricular tachycardia)   . Obesity   . Arthritis   . Cardiomyopathy     History  Substance Use Topics  . Smoking status: Never Smoker   . Smokeless tobacco: Not on file  . Alcohol Use: Yes     Rarely    Family History  Problem Relation Age of Onset  . Hypertension Mother   . Clotting disorder Mother     mom with PEs  . Diabetes Father   . Heart attack Father   . Hypertension Other     Sibling  . Diabetes Other   . Obesity Other     No Known Allergies  Current outpatient prescriptions:carvedilol (COREG) 12.5 MG tablet, Take 1 tablet (12.5 mg total) by mouth 2 (two) times daily with a meal., Disp: 60 tablet, Rfl: 6;  furosemide (LASIX) 40 MG tablet, Take 1 tablet (40 mg total) by mouth daily., Disp: 30 tablet, Rfl: prn;  potassium chloride (K-DUR) 10 MEQ tablet, TAKE ONE TABLET BY MOUTH EVERY DAY, Disp: 30 tablet, Rfl: 4 warfarin (COUMADIN) 4 MG tablet, Take 1 tablet (4 mg total) by mouth daily., Disp: 30 tablet, Rfl: 2;  warfarin (COUMADIN) 5 MG tablet, Take 1 tablet (5 mg total) by mouth daily.,  Disp: 30 tablet, Rfl: 2;  doxycycline (VIBRA-TABS) 100 MG tablet, Take 1 tablet (100 mg total) by mouth 2 (two) times daily., Disp: 20 tablet, Rfl: 0 HYDROcodone-acetaminophen (VICODIN) 5-500 MG per tablet, Take 1 tablet by mouth every 6 (six) hours as needed for pain., Disp: 30 tablet, Rfl: 0;  losartan (COZAAR) 25 MG tablet, Take 1 tablet (25 mg total) by mouth daily., Disp: 30 tablet, Rfl: 5;  nitroGLYCERIN (NITROSTAT) 0.4 MG SL tablet, Place 0.4 mg under the tongue every 5 (five) minutes as needed.  , Disp: , Rfl:  verapamil (CALAN) 80 MG tablet, Take 80 mg by mouth as needed. Take only with break through SVT , Disp: , Rfl: ;  warfarin (COUMADIN) 10 MG tablet, TAKE ONE TABLET BY MOUTH EVERY DAY, Disp: 30 tablet, Rfl: 0  BP 113/78  Pulse 65  Resp 16  Ht 5\' 8"  (1.727 m)  Wt 320 lb (145.151 kg)  BMI 48.66 kg/m2  Body mass index is 48.66 kg/(m^2).           Review of Systems denies chest pain, does have dyspnea on exertion. Complains of pain in legs with walking, swelling in feet, numbness in arms and legs. Other systems negative and complete review  of systems    Objective:   Physical Exam blood pressure 1:30 of 78 heart rate 65 respirations 16 Gen.-alert and oriented x3 in no apparent distress-morbidly obese  HEENT normal for age Lungs no rhonchi or wheezing Cardiovascular regular rhythm no murmurs carotid pulses 3+ palpable no bruits audible Abdomen soft nontender no palpable masses Musculoskeletal free of  major deformities Skin clear -no rashes Neurologic normal Lower extremities 3+ femoral and dorsalis pedis pulses palpable bilaterally with 2-3+ edema left leg [place] below the knee. No specific edema right leg. No varicosities noted.  Today I ordered a venous duplex exam of the left leg which are reviewed and interpreted. There is diffuse deep vein reflux in the left leg with evidence of recanalization of the deep system. Calf veins are not well visualized. The great  saphenous vein is large but there is no reflux present.     Assessment:     Chronic venous insufficiency left leg due to deep vein reflux from previous DVT on 2 occasions and history of pulmonary embolism     Plan:     #1 agree with permanent anticoagulation #2 needs to elevate legs 4-6 inches at night by elevating foot of bed #3 fitted for short leg elastic compression stockings today which need to be placed on leg prior to getting up in the morning #4 no further suggestions

## 2011-11-05 ENCOUNTER — Encounter (INDEPENDENT_AMBULATORY_CARE_PROVIDER_SITE_OTHER): Payer: BC Managed Care – PPO

## 2011-11-05 DIAGNOSIS — I872 Venous insufficiency (chronic) (peripheral): Secondary | ICD-10-CM

## 2011-11-10 ENCOUNTER — Encounter: Payer: Self-pay | Admitting: *Deleted

## 2011-12-15 ENCOUNTER — Ambulatory Visit (INDEPENDENT_AMBULATORY_CARE_PROVIDER_SITE_OTHER): Payer: BC Managed Care – PPO | Admitting: Medical

## 2011-12-15 ENCOUNTER — Encounter: Payer: Self-pay | Admitting: Medical

## 2011-12-15 VITALS — BP 116/78 | HR 130 | Resp 18 | Wt 334.0 lb

## 2011-12-15 DIAGNOSIS — I4892 Unspecified atrial flutter: Secondary | ICD-10-CM

## 2011-12-15 DIAGNOSIS — R0602 Shortness of breath: Secondary | ICD-10-CM

## 2011-12-15 DIAGNOSIS — I471 Supraventricular tachycardia: Secondary | ICD-10-CM

## 2011-12-15 DIAGNOSIS — Z7901 Long term (current) use of anticoagulants: Secondary | ICD-10-CM

## 2011-12-15 MED ORDER — VERAPAMIL HCL 80 MG PO TABS: 80.0000 mg | ORAL_TABLET | Freq: Three times a day (TID) | ORAL | Status: DC

## 2011-12-15 NOTE — Progress Notes (Signed)
Subjective: Here today as a walk in for c/o SOB.  He has known hx/o cardiomyopathy, SVT, hx/o DVT.  Sees cardiology, Dr. [name] at [place].   He notes hx/o SVT, prior aflutter/afib issues, and as of last cardiology f/u, was advised to use verapamil prn up to TID.   In the past his SVT would usually be palpitations symptoms, and about a week ago doing some strength training at the gym he felt a funny heart beat.  Since then he has had some shortness of breath with exercise, even with walking from store parking lot to the entrance.   This is mild, but different than usual.  In the past 14mo- 1year, he could make the palpitations or SOB go away with valsalva or taking prn verapamil.  However, for the past week his SOB symptoms have lingered.  He hasn't used verapamil in 14mo.  He is taking his coumadin 9mg  daily, taking coreg and other medications as usual.  He denies syncope, presyncope, no leg edema, no chest pain, no associated sweats, nausea, paresthesias.  Last cardiology visit 05/2010.  Ablation was recommended but he declined at that time.   No other aggravating or relieving factors.    No other c/o.  The following portions of the patient's history were reviewed and updated as appropriate: allergies, current medications, past family history, past medical history, past social history, past surgical history and problem list.  Past Medical History  Diagnosis Date  . Mural thrombus of heart     coumadin  . Pulmonary embolism     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  . SVT (supraventricular tachycardia)   . Obesity   . Arthritis   . Cardiomyopathy     No Known Allergies   Review of Systems ROS reviewed and was negative other than noted in HPI or above.    Objective:   Physical Exam  General appearance: alert, no distress, WD/WN, obese Neck: supple, no lymphadenopathy, no thyromegaly, no masses, no JVD Heart: tachycardic, normal S1, S2, no murmurs Lungs: CTA bilaterally, no wheezes,  rhonchi, or rales Abdomen: +bs, soft, non tender, non distended, no masses, no hepatomegaly, no splenomegaly Pulses: tachycardic, no obvious skipped beats, normal cap refill Ext: no edema   Adult ECG Report  Indication: SOB, palpitations, hx/o SVT, afib/flutter  Rate: 131bpm  Rhythm: atrial flutter  QRS Axis: -57 degrees  PR Interval: unable to see in most leads  QRS Duration: 84ms  QTc:  Conduction Disturbances: atrial flutter pattern, some p waves without asscoiated QRS complex in V1, V2, and associated criteria suggestive of variable AV block  Other Abnormalities: as noted above  Patient's cardiac risk factors are: hypertension, male gender and obesity (BMI >= 30 kg/m2).  EKG comparison: 05/2010 with obvious changes of aflutter  Narrative Interpretation: atrial flutter with variable AV block, left axis deviation and changed compared to 5/12 EKG without atrial flutter pattern   Assessment and Plan :     Encounter Diagnoses  Name Primary?  . Shortness of breath Yes  . Atrial flutter   . PSVT (paroxysmal supraventricular tachycardia)   . Long term (current) use of anticoagulants    Reviewed prior cardiology notes including the 05/2010 note with [facility]   At that time ablation was considered, he was c/t on Verapamil at that time, but beta blocker was held due to low heart rate.  He was advised to f/u with Dr. Allred/cardiology.  He hasn't been back since May 2012.  Discussed  EKG findings today and history with Dr. Susann Givens supervising physician.  He has been in afib/flutter prior, he is not currently on verapamil.  He is anticoagulated though.   Repeat PT/INR today.  Advised he use the Verapamil scheduled TID instead of prn.  Begin back on verapamil today.  C/t coumadin and other meds as usual.   Advised he make f/u appt with Dr. [name] soon to discuss ablation and other other medications since he does like the sluggishness he gets with verapamil, but he has tolerated this ok  though.   If worse symptoms as discussed in the meantime, call 911.  otherwise f/u here in 2-3 days.

## 2011-12-15 NOTE — Patient Instructions (Signed)
Restart Verapamil 80mg  3 times daily.  We will call this afternoon regarding coumadin dose.  Get a follow up appointment with Dr. [name] soon.  If worse in the meantime, worse shortness of breath, chest pain, feeling of fainting, not improving, call 911.  Otherwise, follow up here in 2-3 days.

## 2011-12-16 ENCOUNTER — Telehealth: Payer: Self-pay | Admitting: Family Medicine

## 2011-12-16 ENCOUNTER — Inpatient Hospital Stay (HOSPITAL_COMMUNITY)
Admission: EM | Admit: 2011-12-16 | Discharge: 2011-12-24 | DRG: 124 | Disposition: A | Discharge status: Home / Self Care | Payer: BC Managed Care – PPO | Attending: Cardiology | Admitting: Cardiology

## 2011-12-16 ENCOUNTER — Encounter: Payer: Self-pay | Admitting: Internal Medicine

## 2011-12-16 ENCOUNTER — Emergency Department (HOSPITAL_COMMUNITY): Payer: BC Managed Care – PPO

## 2011-12-16 ENCOUNTER — Encounter (HOSPITAL_COMMUNITY): Payer: Self-pay | Admitting: *Deleted

## 2011-12-16 DIAGNOSIS — R609 Edema, unspecified: Secondary | ICD-10-CM

## 2011-12-16 DIAGNOSIS — D72829 Elevated white blood cell count, unspecified: Secondary | ICD-10-CM | POA: Diagnosis present

## 2011-12-16 DIAGNOSIS — I428 Other cardiomyopathies: Secondary | ICD-10-CM

## 2011-12-16 DIAGNOSIS — E876 Hypokalemia: Secondary | ICD-10-CM

## 2011-12-16 DIAGNOSIS — Z86718 Personal history of other venous thrombosis and embolism: Secondary | ICD-10-CM

## 2011-12-16 DIAGNOSIS — Z7901 Long term (current) use of anticoagulants: Secondary | ICD-10-CM

## 2011-12-16 DIAGNOSIS — Z6841 Body Mass Index (BMI) 40.0 and over, adult: Secondary | ICD-10-CM

## 2011-12-16 DIAGNOSIS — L02419 Cutaneous abscess of limb, unspecified: Secondary | ICD-10-CM | POA: Diagnosis present

## 2011-12-16 DIAGNOSIS — L03116 Cellulitis of left lower limb: Secondary | ICD-10-CM

## 2011-12-16 DIAGNOSIS — M129 Arthropathy, unspecified: Secondary | ICD-10-CM | POA: Diagnosis present

## 2011-12-16 DIAGNOSIS — I739 Peripheral vascular disease, unspecified: Secondary | ICD-10-CM | POA: Diagnosis present

## 2011-12-16 DIAGNOSIS — I471 Supraventricular tachycardia, unspecified: Secondary | ICD-10-CM

## 2011-12-16 DIAGNOSIS — I5023 Acute on chronic systolic (congestive) heart failure: Secondary | ICD-10-CM

## 2011-12-16 DIAGNOSIS — I509 Heart failure, unspecified: Secondary | ICD-10-CM

## 2011-12-16 DIAGNOSIS — R894 Abnormal immunological findings in specimens from other organs, systems and tissues: Secondary | ICD-10-CM | POA: Diagnosis present

## 2011-12-16 DIAGNOSIS — I5043 Acute on chronic combined systolic (congestive) and diastolic (congestive) heart failure: Principal | ICD-10-CM

## 2011-12-16 DIAGNOSIS — I498 Other specified cardiac arrhythmias: Secondary | ICD-10-CM | POA: Diagnosis present

## 2011-12-16 DIAGNOSIS — R760 Raised antibody titer: Secondary | ICD-10-CM

## 2011-12-16 DIAGNOSIS — Z79899 Other long term (current) drug therapy: Secondary | ICD-10-CM

## 2011-12-16 HISTORY — DX: Supraventricular tachycardia: I47.1

## 2011-12-16 HISTORY — DX: Chronic combined systolic (congestive) and diastolic (congestive) heart failure: I50.42

## 2011-12-16 HISTORY — DX: Morbid (severe) obesity due to excess calories: E66.01

## 2011-12-16 HISTORY — DX: Raised antibody titer: R76.0

## 2011-12-16 HISTORY — DX: Acute embolism and thrombosis of unspecified deep veins of unspecified lower extremity: I82.409

## 2011-12-16 HISTORY — DX: Supraventricular tachycardia, unspecified: I47.10

## 2011-12-16 HISTORY — DX: Other cardiomyopathies: I42.8

## 2011-12-16 LAB — CBC WITH DIFFERENTIAL/PLATELET
Basophils Absolute: 0 10*3/uL (ref 0.0–0.1)
Basophils Relative: 0 % (ref 0–1)
Eosinophils Absolute: 0.1 10*3/uL (ref 0.0–0.7)
Eosinophils Relative: 1 % (ref 0–5)
HCT: 42.2 % (ref 39.0–52.0)
Hemoglobin: 13.7 g/dL (ref 13.0–17.0)
MCH: 28 pg (ref 26.0–34.0)
MCHC: 32.5 g/dL (ref 30.0–36.0)
MCV: 86.3 fL (ref 78.0–100.0)
Monocytes Absolute: 0.8 10*3/uL (ref 0.1–1.0)
Monocytes Relative: 7 % (ref 3–12)
Neutro Abs: 8.3 10*3/uL — ABNORMAL HIGH (ref 1.7–7.7)
RDW: 14.2 % (ref 11.5–15.5)

## 2011-12-16 LAB — URINALYSIS, ROUTINE W REFLEX MICROSCOPIC
Bilirubin Urine: NEGATIVE
Hgb urine dipstick: NEGATIVE
Ketones, ur: NEGATIVE mg/dL
Specific Gravity, Urine: 1.005 (ref 1.005–1.030)
Urobilinogen, UA: 0.2 mg/dL (ref 0.0–1.0)

## 2011-12-16 LAB — BASIC METABOLIC PANEL
BUN: 15 mg/dL (ref 6–23)
Creatinine, Ser: 0.99 mg/dL (ref 0.50–1.35)
GFR calc Af Amer: >90 mL/min (ref >90)
GFR calc non Af Amer: >90 mL/min (ref >90)
Glucose, Bld: 104 mg/dL — ABNORMAL HIGH (ref 70–99)
Potassium: 3.8 mEq/L (ref 3.5–5.1)

## 2011-12-16 LAB — TROPONIN I
Troponin I: <0.3 ng/mL (ref <0.30)
Troponin I: <0.3 ng/mL (ref <0.30)

## 2011-12-16 LAB — PROTIME-INR: INR: 3.17 — ABNORMAL HIGH (ref 0.00–1.49)

## 2011-12-16 MED ORDER — ACETAMINOPHEN 325 MG PO TABS
650.0000 mg | ORAL_TABLET | ORAL | Status: DC | PRN
  Administered 2011-12-17: 650 mg via ORAL
  Filled 2011-12-16: qty 2

## 2011-12-16 MED ORDER — FUROSEMIDE 10 MG/ML IJ SOLN
40.0000 mg | Freq: Two times a day (BID) | INTRAMUSCULAR | Status: DC
  Administered 2011-12-16 – 2011-12-17 (×2): 40 mg via INTRAVENOUS
  Filled 2011-12-16 (×4): qty 4

## 2011-12-16 MED ORDER — LOSARTAN POTASSIUM 25 MG PO TABS
25.0000 mg | ORAL_TABLET | Freq: Every day | ORAL | Status: DC
  Administered 2011-12-17 – 2011-12-24 (×8): 25 mg via ORAL
  Filled 2011-12-16 (×8): qty 1

## 2011-12-16 MED ORDER — ONDANSETRON HCL 4 MG/2ML IJ SOLN: 4.0000 mg | Freq: Four times a day (QID) | INTRAMUSCULAR | Status: DC | PRN

## 2011-12-16 MED ORDER — SODIUM CHLORIDE 0.9 % IJ SOLN: 3.0000 mL | INTRAMUSCULAR | Status: DC | PRN

## 2011-12-16 MED ORDER — POTASSIUM CHLORIDE CRYS ER 10 MEQ PO TBCR
10.0000 meq | EXTENDED_RELEASE_TABLET | Freq: Two times a day (BID) | ORAL | Status: DC
  Administered 2011-12-16 – 2011-12-23 (×14): 10 meq via ORAL
  Filled 2011-12-16 (×15): qty 1

## 2011-12-16 MED ORDER — SODIUM CHLORIDE 0.9 % IV SOLN
250.0000 mL | INTRAVENOUS | Status: DC | PRN
  Administered 2011-12-20: 250 mL via INTRAVENOUS

## 2011-12-16 MED ORDER — NITROGLYCERIN 0.4 MG SL SUBL: 0.4000 mg | SUBLINGUAL_TABLET | SUBLINGUAL | Status: DC | PRN

## 2011-12-16 MED ORDER — FUROSEMIDE 10 MG/ML IJ SOLN
40.0000 mg | Freq: Once | INTRAMUSCULAR | Status: AC
  Administered 2011-12-16: 40 mg via INTRAVENOUS
  Filled 2011-12-16: qty 4

## 2011-12-16 MED ORDER — GUAIFENESIN 100 MG/5ML PO SOLN
200.0000 mg | ORAL | Status: DC | PRN
  Administered 2011-12-16: 200 mg via ORAL
  Filled 2011-12-16 (×2): qty 10

## 2011-12-16 MED ORDER — CEPHALEXIN 500 MG PO CAPS
500.0000 mg | ORAL_CAPSULE | Freq: Four times a day (QID) | ORAL | Status: DC
  Administered 2011-12-16 – 2011-12-23 (×27): 500 mg via ORAL
  Filled 2011-12-16 (×31): qty 1

## 2011-12-16 MED ORDER — CARVEDILOL 12.5 MG PO TABS
12.5000 mg | ORAL_TABLET | Freq: Two times a day (BID) | ORAL | Status: DC
  Administered 2011-12-16 – 2011-12-24 (×15): 12.5 mg via ORAL
  Filled 2011-12-16 (×18): qty 1

## 2011-12-16 MED ORDER — SODIUM CHLORIDE 0.9 % IJ SOLN
3.0000 mL | Freq: Two times a day (BID) | INTRAMUSCULAR | Status: DC
  Administered 2011-12-16: 17:00:00 via INTRAVENOUS
  Administered 2011-12-16 – 2011-12-21 (×8): 3 mL via INTRAVENOUS

## 2011-12-16 MED ORDER — GUAIFENESIN 100 MG/5ML PO SYRP
200.0000 mg | ORAL_SOLUTION | ORAL | Status: DC | PRN
  Filled 2011-12-16: qty 118

## 2011-12-16 MED ORDER — ALPRAZOLAM 0.25 MG PO TABS: 0.2500 mg | ORAL_TABLET | Freq: Two times a day (BID) | ORAL | Status: DC | PRN

## 2011-12-16 NOTE — Progress Notes (Signed)
Patient stated he voided x 2 after receiving lasix. Encourage use of urinal for I/O; patient verbalized understanding. Call bell near. Mamie Levers

## 2011-12-16 NOTE — Progress Notes (Signed)
*  PRELIMINARY RESULTS* Vascular Ultrasound Left lower extremity venous duplex has been completed.  Preliminary findings: Left:  No evidence of DVT, superficial thrombosis, or Baker's cyst.   Farrel Demark, RDMS, RVT 12/16/2011, 5:05 PM

## 2011-12-16 NOTE — ED Notes (Signed)
Pt states "I haven't had any pain in my chest, only some tightness when I walk. Since I'm resting I don't have any pain or tightness. I feel fine, just a little short of breath."

## 2011-12-16 NOTE — ED Notes (Signed)
Patient with increasing sob for the past 1 week.  He has noted dry cough in ED today.  He was seen by his md on yesterday,  Inr was therapeutic.  He is here today due to increased sob and chest tightness.  He has weakness and could only walk a few steps.  Patient denies any obvious weight gain.  He has chronic edema in the left lower leg,  Same per patient.  Patient denies fever.

## 2011-12-16 NOTE — ED Notes (Signed)
Patient continues to have sob at rest.  He is aware of need for urine specimen

## 2011-12-16 NOTE — ED Provider Notes (Signed)
History     CSN: [id number]  Arrival date & time 12/16/11  0808   First MD Initiated Contact with Patient 12/16/11 0818      Chief Complaint  Patient presents with  . Shortness of Breath  . Cough  . Weakness    (Consider location/radiation/quality/duration/timing/severity/associated sxs/prior treatment) The history is provided by the patient and medical records.   46 year old male with a past medical history significant for paroxysmal SVT, pulmonary embolism and DVT post surgery and history of pulmonary edema presents with chief complaint of worsening shortness of breath.  Symptoms began this past Monday.  Patient noticed increased DOE.  I have been worsening over the week to the point that he is unable to take more than 5 steps without significant dyspnea which is relieved by rest.  He is also had increased bilateral lower extremity edema.  He has a slight cough without hemoptysis but also had an upper respiratory infection 2 weeks ago and cough has been persistent since.  He denies any chest pain.  He had one episode of racing heart 2 days ago and took his verapamil at home.  He states that his dyspnea has not been relieved. Saw PCP this week and was told DOE was realted to his heart. Wife states that his symptoms are very similar to his previous DVT that occurred after his Patellar repair.   He denies chest pain but does have substernal chest tightness. Denies orthopnea or PND. Patient denies  Abdominal sxs, melena or hematochezia.   Denies fevers, chills, myalgias, arthralgias. Denieschest tightness or pressure, radiation to left arm, jaw or back, or diaphoresis. Denies dysuria, flank pain, suprapubic pain, frequency, urgency, or hematuria. Denies headaches, light headedness, weakness, visual disturbances. Denies abdominal pain, nausea, vomiting, diarrhea or constipation.    Past Medical History  Diagnosis Date  . Mural thrombus of heart     coumadin  . Pulmonary embolism     DVT  and PE after knee surgery in 2007  . SVT (supraventricular tachycardia)   . Obesity   . Arthritis   . Cardiomyopathy   . Deep vein thrombosis     Past Surgical History  Procedure Date  . Patellar tendon repair     Left    Family History  Problem Relation Age of Onset  . Hypertension Mother   . Clotting disorder Mother     mom with PEs  . Diabetes Father   . Heart attack Father   . Hypertension Other     Sibling  . Diabetes Other   . Obesity Other     History  Substance Use Topics  . Smoking status: Never Smoker   . Smokeless tobacco: Not on file  . Alcohol Use: Yes     Comment: Rarely      Review of Systems  Allergies  Shrimp  Home Medications   Current Outpatient Rx  Name  Route  Sig  Dispense  Refill  . CARVEDILOL 12.5 MG PO TABS   Oral   Take 1 tablet (12.5 mg total) by mouth 2 (two) times daily with a meal.   60 tablet   6   . FUROSEMIDE 40 MG PO TABS   Oral   Take 1 tablet (40 mg total) by mouth daily.   30 tablet   prn   . LOSARTAN POTASSIUM 25 MG PO TABS   Oral   Take 1 tablet (25 mg total) by mouth daily.   30 tablet   5   .  NITROGLYCERIN 0.4 MG SL SUBL   Sublingual   Place 0.4 mg under the tongue every 5 (five) minutes as needed.           [name] POTASSIUM CHLORIDE ER 10 MEQ PO TBCR   Oral   Take 10 mEq by mouth daily.         [name] VERAPAMIL HCL 80 MG PO TABS   Oral   Take 1 tablet (80 mg total) by mouth 3 (three) times daily. Take only with break through SVT   90 tablet   2   . WARFARIN SODIUM 4 MG PO TABS   Oral   Take 4 mg by mouth daily. Take with coumadin 5mg          . WARFARIN SODIUM 5 MG PO TABS   Oral   Take 5 mg by mouth daily. Take with coumadin 4mg            BP 110/68  Pulse 83  Temp 98 F (36.7 C) (Oral)  Resp 30  Ht 5\' 8"  (1.727 m)  Wt 320 lb (145.151 kg)  BMI 48.66 kg/m2  SpO2 93%  Physical Exam  Nursing note and vitals reviewed. Constitutional: He is oriented to person, place, and time.        Pleasant, Morbidly obese patietn in NAD   HENT:  Head: Normocephalic and atraumatic.  Eyes: Conjunctivae normal and EOM are normal. Pupils are equal, round, and reactive to light.  Neck: Normal range of motion. Neck supple. No JVD present.  Cardiovascular:       Distant Heart sounds  Pulmonary/Chest:       tachypneic. Fine crackles heard best posteriorly in the lung bases.  No wheezes rhonchi or rales. Normal effort  Abdominal: Soft.       Soft obese abdomen.  No tenderness to palpation.  Musculoskeletal:       Bilateral lower extremity pitting edema worse on the right side.  No tenderness of the calf.  Tattooing of the left lower extremity consistent with venous stasis.  Lymphadenopathy:    He has no cervical adenopathy.  Neurological: He is alert and oriented to person, place, and time.  Skin: Skin is warm and dry. No rash noted.    ED Course  Procedures (including critical care time)  Labs Reviewed  CBC WITH DIFFERENTIAL - Abnormal; Notable for the following:    WBC 11.0 (*)     Neutro Abs 8.3 (*)     All other components within normal limits  PRO B NATRIURETIC PEPTIDE - Abnormal; Notable for the following:    Pro B Natriuretic peptide (BNP) 594.5 (*)     All other components within normal limits  BASIC METABOLIC PANEL - Abnormal; Notable for the following:    Glucose, Bld 104 (*)     All other components within normal limits  PROTIME-INR - Abnormal; Notable for the following:    Prothrombin Time 30.8 (*)     INR 3.17 (*)     All other components within normal limits  TROPONIN I  D-DIMER, QUANTITATIVE  URINALYSIS, ROUTINE W REFLEX MICROSCOPIC   Dg Chest 2 View  12/16/2011  *RADIOLOGY REPORT*  Clinical Data: Shortness of breath, cough, weakness.  CHEST - 2 VIEW  Comparison: 06/14/2009  Findings: Cardiomegaly with vascular congestion.  Slight interstitial prominence.  Cannot exclude early interstitial edema. Linear bibasilar densities, likely atelectasis.  No visible  effusions.  No acute bony abnormality.  IMPRESSION: Cardiomegaly with vascular congestion.  Question early interstitial edema.  Bibasilar atelectasis.   Original Report Authenticated By: Charlett Nose, M.D.      Date: 12/16/2011  Rate: 78  Rhythm: normal sinus rhythm  QRS Axis: normal  Intervals: normal  ST/T Wave abnormalities: nonspecific T wave changes  Conduction Disutrbances:none  Narrative Interpretation: abnormal ECG, low voltage, unchanged form previous  Old EKG Reviewed: unchanged *low voltage likely de to body habitus.  1. CHF exacerbation   2. Acute on chronic combined systolic and diastolic heart failure       MDM  9:42 AM BP 103/64  Pulse 83  Temp 98.6 F (37 C) (Oral)  Resp 31  Ht 5\' 8"  (1.727 m)  Wt 320 lb (145.151 kg)  BMI 48.66 kg/m2  SpO2 92%  Patient with DOE. VS show mild hypotension and tachypnea.  DDX PE/Pulmonary edema/ ACS. CXR shows mild atelectasis and questionable early pulm edema.     11:08 AM BNP is elevated and chest x-ray shows some interstitial edema.  D-dimer is negative.  Patient had some mildly elevated white count.  Possibly from his recent upper respiratory infection.. I began the patient on IV Lasix and currently calling for admission for congestive heart failure exacerbation.  11:31 AM I spoke with Trish from [facility] cardiology who will have cardiology consult for admission.    Arthor Captain, PA-C 12/16/11 1645

## 2011-12-16 NOTE — Progress Notes (Signed)
ANTICOAGULATION CONSULT NOTE - Initial Consult  Pharmacy Consult for Coumadin Indication: Hx of recurrent DVT/PE & Hx of apical thrombus  Allergies  Allergen Reactions  . Shrimp (Shellfish Allergy) Hives and Itching    Patient Measurements: Height: 5\' 8"  (172.7 cm) Weight: 331 lb 9.2 oz (150.4 kg) (scale A) IBW/kg (Calculated) : 68.4   Vital Signs: Temp: 98 F (36.7 C) (12/03 1442) Temp src: Oral (12/03 1442) BP: 118/76 mmHg (12/03 1442) Pulse Rate: 86  (12/03 1442)  Labs:  Basename 12/16/11 0858 12/16/11 0857 12/15/11 0854  HGB -- 13.7 --  HCT -- 42.2 --  PLT -- 195 --  APTT -- -- --  LABPROT -- 30.8* 29.5*  INR -- 3.17* 3.08*  HEPARINUNFRC -- -- --  CREATININE -- 0.99 --  CKTOTAL -- -- --  CKMB -- -- --  TROPONINI <0.30 -- --    Estimated Creatinine Clearance: 133.5 ml/min (by C-G formula based on Cr of 0.99).   Medical History: Past Medical History  Diagnosis Date  . Mural thrombus of heart     coumadin  . Pulmonary embolism     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  . SVT (supraventricular tachycardia)   . Obesity   . Arthritis   . Nonischemic cardiomyopathy     06/2006 cath: angiographically normal coronaries  . Deep vein thrombosis     Assessment: 46 yo male with PMHx s/f NICM, hx recurrent DVT with resultant PE and chronic venous insufficiency, hx LV thrombus, PSVT & morbid obesity presented to the ED with chest pain and shortness of breath. Patient being continued on chronic coumadin (home dose is Coumadin 9mg  daily managed outpatient by Dr. Susann Givens) . Patient reports having taken his coumadin dose this morning. INR slightly supra-therapeutic today at 3.17. Hgb 13.7, no issues with bleeding noted in chart.  Goal of Therapy:  INR 2.5-3.0 (per [place], [facility] @ Dr. Jola Babinski office) Monitor platelets by anticoagulation protocol: Yes   Plan:  1) No coumadin tonight (since already took dose this morning) 2) Follow-up INR in AM for further  dosing  Benjaman Pott, PharmD    12/16/2011   2:59 PM

## 2011-12-16 NOTE — Telephone Encounter (Signed)
Roberto [name] with [facility] pharmacy called for goal for pt.  Per Roberto [name] goal for INR is 2.5 - 3.0 advised Roberto [name] of same  [phone]

## 2011-12-16 NOTE — H&P (Signed)
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   He had followed up with Dr. [name] and Tereso Newcomer, PA-C in the office, most recently in 05/2010, for NICM and SVT. He was initially seen by Dr. [name] in 2007 for SVT after undergoing knee surgery c/b DVT and PE. An echo performed revealed reduced EF, and he underwent an OP Myoview in 06/2006 revealing EF 32%, anterior, apical and inferior scar with inferior peri-infarct ischemia. He then underwent cardiac cath revealing normal coronaries. Last echo in 08/2009 revealed EF 45%, apical thrombus and moderate LAE. He had opted for medical therapy over RFA for SVT, but on follow-up, endorsed intermittent tachy-palpitations c/w prior SVT history. He then expressed interest in RFA. Referral for RFA was suggested, but no further office notes are apparent. He was continued on Coumadin.   He was actually seen by Dr. Aleen Campi yesterday c/o worsening shortness of breath. Of note, mother has a history of clotting disorder. He reports progressively worsening DOE over the past week with associated chest tightness and LE edema. No orthopnea, PND or syncope. Chest tightness without radiation. No diaphoresis or nausea. This occurred after experiencing a 15-minute episode of tachy-palpitations after performing circuit training relieved with Valsalva and verapamil 1 week ago. He does report dietary indiscretion. No thyroid issues in the past. No EtOH, caffeine or elicit drug use. No increased  stress. No prior chest discomfort.   In the ED, EKG reveals NSR with unchanged nonspecific lateral T wave changes. TnI WNL. CBC reveals a very mild leukocytosis at 11K. BMET unremarkable. D-dimer WNL. INR therapeutic. pBNP very mildly elevated at 594.5. CXR does reveal cardiomegaly with vascular congestion and possible early interstitial edema. He received Lasix 40mg  IV x 1 in the ED with already 1/2 liter diuresed.   Problem List: Past Medical History  Diagnosis Date  . Mural thrombus of heart     coumadin  . Pulmonary embolism     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  . SVT (supraventricular tachycardia)   . Obesity   . Arthritis   . Cardiomyopathy   . Deep vein thrombosis     Past Surgical History  Procedure Date  . Patellar tendon repair     Left     Allergies:  Allergies  Allergen Reactions  . Shrimp (Shellfish Allergy) Hives and Itching    Home Medications: Prior to Admission medications   Medication Sig Start Date End Date Taking? Authorizing Provider  carvedilol (COREG) 12.5 MG tablet Take 1 tablet (12.5 mg total) by mouth 2 (two) times daily with a meal. 01/06/11  Yes [name], MD  furosemide (LASIX) 40 MG tablet Take 1 tablet (40 mg total) by mouth daily. 01/06/11  Yes [name], MD  losartan (COZAAR) 25 MG tablet Take 1 tablet (25 mg total) by mouth daily. 01/06/11  Yes [name], MD  nitroGLYCERIN (NITROSTAT) 0.4 MG SL tablet Place 0.4 mg under the tongue every 5 (five) minutes as needed.     Yes Historical Provider,  MD  potassium chloride (K-DUR) 10 MEQ tablet Take 10 mEq by mouth daily.   Yes Historical Provider, MD  verapamil (CALAN) 80 MG tablet Take 1 tablet (80 mg total) by mouth 3 (three) times daily. Take only with break through SVT 12/15/11  Yes Kermit Balo Tysinger, PA  warfarin (COUMADIN) 4 MG tablet Take 4 mg by mouth daily. Take with coumadin 5mg    Yes Historical Provider, MD  warfarin (COUMADIN) 5 MG tablet Take 5 mg by mouth daily. Take  with coumadin 4mg    Yes Historical Provider, MD    Inpatient Medications:     . [COMPLETED] furosemide  40 mg Intravenous Once    (Not in a hospital admission)  Family History  Problem Relation Age of Onset  . Hypertension Mother   . Clotting disorder Mother     mom with PEs  . Diabetes Father   . Heart attack Father   . Hypertension Other     Sibling  . Diabetes Other   . Obesity Other      History   Social History  . Marital Status: Married    Spouse Name: N/A    Number of Children: 5  . Years of Education: N/A   Occupational History  . Oceanographer for [employer]    Social History Main Topics  . Smoking status: Never Smoker   . Smokeless tobacco: Not on file  . Alcohol Use: Yes     Comment: Rarely  . Drug Use: No  . Sexually Active: Not on file   Other Topics Concern  . Not on file   Social History Narrative   Lives in [place] with 5 offspring     Review of Systems: General: negative for chills, fever, night sweats or weight changes.  Cardiovascular: positive for DOE, LE edema, palpitations and chest tightness, negative for paroxysmal nocturnal dyspnea or orthopnea Dermatological: positive for LLE rash Respiratory: negative for cough or wheezing Urologic: negative for hematuria Abdominal: negative for nausea, vomiting, diarrhea, bright red blood per rectum, melena, or hematemesis Neurologic:  negative for visual changes, syncope, or dizziness All other systems reviewed and are otherwise negative except as noted above.  Physical Exam: Blood pressure 119/89, pulse 78, temperature 98.6 F (37 C), temperature source Oral, resp. rate 31, height 5\' 8"  (1.727 m), weight 145.151 kg (320 lb), SpO2 93.00%.    General: Obese, well developed, in no acute distress. Head: Normocephalic, atraumatic, sclera non-icteric, no xanthomas, nares are without discharge.  Neck: Negative for carotid bruits. JVP 7-8 cm.  Lungs: Fine  bibasilar rales apparent. No wheezes or rhonchi. Breathing is unlabored. Heart: RRR with S1 S2. No murmurs, rubs, or gallops appreciated. Abdomen: Soft, non-tender, non-distended with normoactive bowel sounds. No hepatomegaly. No rebound/guarding. No obvious abdominal masses. Msk:  Strength and tone appears normal for age. Extremities: 1+ bilateral pretibial edema. LLE erythema, calor and hyperpigmentation. No clubbing or cyanosis.  Distal pedal pulses are 2+ and equal bilaterally. Neuro: Alert and oriented X 3. Moves all extremities spontaneously. Psych:  Responds to questions appropriately with a normal affect.  Labs: Recent Labs  [facility] 12/16/11 0857   WBC 11.0*   HGB 13.7   HCT 42.2   MCV 86.3   PLT 195   Recent Labs  Basename 12/16/11 0857   DDIMER <0.27    Lab 12/16/11 0857  NA 138  K 3.8  CL 102  CO2 27  BUN 15  CREATININE 0.99  CALCIUM 8.8  PROT --  BILITOT --  ALKPHOS --  ALT --  AST --  AMYLASE --  LIPASE --  GLUCOSE 104*   Recent Labs  Basename 12/16/11 0858   CKTOTAL --   CKMB --   CKMBINDEX --   TROPONINI <0.30   Radiology/Studies: Dg Chest 2 View  12/16/2011  *RADIOLOGY REPORT*  Clinical Data: Shortness of breath, cough, weakness.  CHEST - 2 VIEW  Comparison: 06/14/2009  Findings: Cardiomegaly with vascular congestion.  Slight interstitial prominence.  Cannot exclude early interstitial edema. Linear bibasilar densities, likely atelectasis.  No visible effusions.  No acute bony abnormality.  IMPRESSION: Cardiomegaly with vascular congestion.  Question early interstitial edema.  Bibasilar atelectasis.   Original Report Authenticated By: Charlett Nose, M.D.     EKG: NSR, 78 bpm, TW flattening V5, V6, I, TW inversion aVL, LAE, LAD, PVC, no ST changes  ASSESSMENT AND PLAN:   1. Acute on chronic systolic CHF/NICM- patient reports progressively worsening DOE over the past week with associated chest tightness and LE edema. No orthopnea, PND or syncope. This  occurred after experiencing a 15-minute episode of tachy-palpitations after performing circuit training relieved with valsalva and verapamil 1 week ago. He does report dietary indiscretion. No thyroid issues in the past. No EtOH or elicit drug use. On exam, there is appreciable 1+ pretibial edema bilaterally, JVD and fine bibasilar rales. Suspect tachy-mediated cardiomyopathy at baseline was exacerbated by recent SVT episode, dietary indiscretion and likely over-exertion. CXR reveals vascular congestion and early interstitial edema. EKG w/o ischemic changes. Initial TnI WNL. Chest discomfort likely demand related to CHF. Will plan to r/o overnight. Diurese with Lasix 40mg  IV BID. K supp. Monitor I/Os, daily weights. Heart healthy diet. Check TSH. Continue ARB, BB, stop verapamil.   2. PSVT- reports an episode of tachy-palpitations 1 week ago while performing circuit training lasting for 15 minutes, and relieved with Valsalva and verapamil. Patient had declined RFA in the past as length of time between episodes was several months. These typically occurred at rest, but now have occurred with exertion and exacerbated CHF. Patient is now interested in RFA. Legrand Rams this option as a remedy, and to facilitate activity, and hence, weight loss as a means of lifestyle improvement and risk factor reduction. There is a note of a-fib/flutter, however there is no documentation of this in the past. On Coumadin.    3. H/o recurrent DVT/PE- continue Coumadin. There is a question of his mother having a clotting disorder, however the patient denies this stating that a blood clot was discovered just prior to being diagnosed with cancer.   4. H/o apical thrombus- continue Coumadin. Repeat echo.   5. Morbid obesity- stressed weight loss. Attempting to be more active, however now limited by exertion-induced SVT further supporting OP EP eval for RFCA.   6. LLE cellulitis- calor and erythema appreciated on exam. Will obtain venous  doppler u/s of LLE. Start Keflex.    Signed, R. Hurman Horn, PA-C 12/16/2011, 12:06 PM   As above, patient seen and examined. Briefly patient is a 50 or old male with a past medical history of nonischemic cardiomyopathy, history of recurrent DVT and pulmonary embolus, history of apical thrombus, SVT who presents with congestive heart failure. Last echocardiogram in August of 2011 showed an ejection fraction of 45%, apical thrombus and moderate left atrial enlargement. Patient states that over the preceding week he has had increasing dyspnea on exertion. He did have an episode of SVT prior to his recent heart failure symptoms. He has  also had orthopnea and over the past 24 hours pedal edema that is worse. He has some chest tightness with exertion. No fevers or chills. He is volume overloaded on examination and his BNP is elevated. He is also noted to have warmth of his left lower extremity where he has chronic edema. Plan to admit and diurese for congestive heart failure. Begin Lasix 40 mg twice a day. Follow renal function. Continue ARB and beta blocker but discontinue verapamil. Increase beta blocker as tolerated by pulse and blood pressure. Repeat echocardiogram. I am concerned that he may have cellulitis of his left lower extremity. We will plan to proceed with venous Doppler of left lower extremity. Add Keflex. Further recommendations based on followup studies. Patient will followup with Dr. Johney Frame following discharge for consideration of SVT ablation. Question of recurrent SVT and tachycardia is contributing to cardiomyopathy. I would like to verapamil if possible given history of reduced LV function. Olga Millers 2:05 PM

## 2011-12-16 NOTE — ED Notes (Signed)
Attempted collect urine, pt could not void.

## 2011-12-17 DIAGNOSIS — I509 Heart failure, unspecified: Secondary | ICD-10-CM

## 2011-12-17 DIAGNOSIS — I059 Rheumatic mitral valve disease, unspecified: Secondary | ICD-10-CM

## 2011-12-17 LAB — BASIC METABOLIC PANEL
CO2: 28 mEq/L (ref 19–32)
Chloride: 100 mEq/L (ref 96–112)
Glucose, Bld: 90 mg/dL (ref 70–99)
Potassium: 3.7 mEq/L (ref 3.5–5.1)
Sodium: 138 mEq/L (ref 135–145)

## 2011-12-17 LAB — PROTIME-INR
INR: 3.1 — ABNORMAL HIGH (ref 0.00–1.49)
Prothrombin Time: 30.3 seconds — ABNORMAL HIGH (ref 11.6–15.2)

## 2011-12-17 MED ORDER — ACETAMINOPHEN 325 MG PO TABS: 650.0000 mg | ORAL_TABLET | ORAL | Status: DC | PRN

## 2011-12-17 MED ORDER — WARFARIN - PHARMACIST DOSING INPATIENT
Freq: Every day | Status: DC
  Administered 2011-12-21 – 2011-12-22 (×2)

## 2011-12-17 MED ORDER — FUROSEMIDE 10 MG/ML IJ SOLN
80.0000 mg | Freq: Two times a day (BID) | INTRAMUSCULAR | Status: DC
  Administered 2011-12-17 – 2011-12-21 (×9): 80 mg via INTRAVENOUS
  Filled 2011-12-17 (×12): qty 8

## 2011-12-17 MED ORDER — WARFARIN SODIUM 4 MG PO TABS
8.0000 mg | ORAL_TABLET | Freq: Once | ORAL | Status: DC
  Filled 2011-12-17: qty 2

## 2011-12-17 MED ORDER — DIGOXIN 125 MCG PO TABS
0.1250 mg | ORAL_TABLET | Freq: Every day | ORAL | Status: DC
  Administered 2011-12-18 – 2011-12-24 (×7): 0.125 mg via ORAL
  Filled 2011-12-17 (×9): qty 1

## 2011-12-17 MED ORDER — SPIRONOLACTONE 12.5 MG HALF TABLET
12.5000 mg | ORAL_TABLET | Freq: Every day | ORAL | Status: DC
  Administered 2011-12-17 – 2011-12-24 (×8): 12.5 mg via ORAL
  Filled 2011-12-17 (×8): qty 1

## 2011-12-17 MED ORDER — FUROSEMIDE 10 MG/ML IJ SOLN: 80.0000 mg | Freq: Two times a day (BID) | INTRAMUSCULAR | Status: DC

## 2011-12-17 NOTE — Progress Notes (Signed)
ANTICOAGULATION CONSULT NOTE - Follow-up Consult  Pharmacy Consult for warfarin Indication: Hx of recurrent DVT/PE & Hx of apical thrombus  Allergies  Allergen Reactions  . Shrimp (Shellfish Allergy) Hives and Itching    Patient Measurements: Height: 5\' 8"  (172.7 cm) Weight: 328 lb (148.78 kg) (scale a) IBW/kg (Calculated) : 68.4   Vital Signs: Temp: 98.3 F (36.8 C) (12/04 0535) Temp src: Oral (12/04 0535) BP: 102/57 mmHg (12/04 0535) Pulse Rate: 78  (12/04 0535)  Labs:  Alvira Philips 12/17/11 0550 12/16/11 2023 12/16/11 1632 12/16/11 0858 12/16/11 0857 12/15/11 0854  HGB -- -- -- -- 13.7 --  HCT -- -- -- -- 42.2 --  PLT -- -- -- -- 195 --  APTT -- -- -- -- -- --  LABPROT 30.3* -- -- -- 30.8* 29.5*  INR 3.10* -- -- -- 3.17* 3.08*  HEPARINUNFRC -- -- -- -- -- --  CREATININE 1.04 -- -- -- 0.99 --  CKTOTAL -- -- -- -- -- --  CKMB -- -- -- -- -- --  TROPONINI -- <0.30 <0.30 <0.30 -- --    Estimated Creatinine Clearance: 126.3 ml/min (by C-G formula based on Cr of 1.04).   Assessment: 46 yo male with PMHx s/f NICM, hx recurrent DVT with resultant PE and chronic venous insufficiency, hx LV thrombus, PSVT & morbid obesity presented to the ED with chest pain and shortness of breath. Patient being continued on warfarin-home dose 9mg  daily managed outpatient by Dr. Susann Givens (per discussion with office, patient's goal INR is 2.5-3). Patient took dose at home before coming to hospital, so no warfarin was given yesterday evening. INR is still slightly supra-therapeutic today at 3.1. Hgb 13.7, no issues with bleeding noted in chart.  Goal of Therapy:  INR 2.5-3.0 (per Crosby Oyster, [facility] @ Dr. Jola Babinski office) Monitor platelets by anticoagulation protocol: Yes   Plan:  1. Warfarin 8mg  po x1 tonight 2. Daily CBC and INR 3. Follow up discharge plans   Emmelina Mcloughlin D. Lovelyn Sheeran, PharmD Clinical Pharmacist Pager: [phone] 12/17/2011 10:40 AM

## 2011-12-17 NOTE — Progress Notes (Signed)
   CARE MANAGEMENT NOTE 12/17/2011  Patient:  Roberto [name], Roberto [name]   Account Number:  [id number]  Date Initiated:  12/17/2011  Documentation initiated by:  GRAVES-BIGELOW,Hawkin Charo  Subjective/Objective Assessment:   Pt admitted with sob. IV lasix initiated.     Action/Plan:   CM will f/u for disposition needs.   Anticipated DC Date:  12/18/2011   Anticipated DC Plan:  HOME W HOME HEALTH SERVICES      DC Planning Services  CM consult      Choice offered to / List presented to:             Status of service:  In process, will continue to follow Medicare Important Message given?   (If response is "NO", the following Medicare IM given date fields will be blank) Date Medicare IM given:   Date Additional Medicare IM given:    Discharge Disposition:    Per UR Regulation:  Reviewed for med. necessity/level of care/duration of stay  If discussed at Long Length of Stay Meetings, dates discussed:    Comments:

## 2011-12-17 NOTE — Progress Notes (Signed)
CARDIAC REHAB PHASE I   PRE:  Rate/Rhythm: 85SR  BP:  Supine:   Sitting: 109/69  Standing:    SaO2: 94%2L, 90%RA  MODE:  Ambulation: 420 ft   POST:  Rate/Rhythem: 96  BP:  Supine:   Sitting: 110/66  Standing:    SaO2: 92%2L 1350-1440 Tried pt on RA prior to walk and dropped to 90%. Walked 420 ft on 2L with steady gait. Some SOB noted. To recliner after walk. Left on 2l. Reviewed green,yellow,red zones and when to call MD. Discussed importance of adhering to 2000 mg sodium. Discussed daily weights. Will continue ed and walk with pt tomorrow.  Duanne Limerick

## 2011-12-17 NOTE — ED Provider Notes (Signed)
Medical screening examination/treatment/procedure(s) were performed by non-physician practitioner and as supervising physician I was immediately available for consultation/collaboration.  Ethelda Chick, MD 12/17/11 [phone]

## 2011-12-17 NOTE — Progress Notes (Signed)
Patient's temperature has been steadily climbing throughout the shift and is now 100.3.  [facility] contacted.  Current Tylenol order was modified to include the use of Tylenol for fever and mild pain.  The RN will carry out the order.

## 2011-12-17 NOTE — Progress Notes (Signed)
Patient: Roberto [name] / Admit Date: 12/16/2011 / Date of Encounter: 12/17/2011, 9:50 AM   Subjective  Patient feels better this am. Has diuresed 3 lb overnight. No further chest pain. Left leg is less inflamed and he is tolerating keflex. INR 3.10   Objective   Telemetry: NSR occ multifocal PVCs, couplets Physical Exam: Filed Vitals:   12/17/11 0535  BP: 102/57  Pulse: 78  Temp: 98.3 F (36.8 C)  Resp: 20   General: Well developed, well nourished, in no acute distress. Head: Normocephalic, atraumatic, sclera non-icteric, no xanthomas, nares are without discharge. Neck: Negative for carotid bruits. JVD not elevated. Lungs: Clear bilaterally to auscultation without wheezes, rales, or rhonchi. Breathing is unlabored. Heart: RRR S1 S2 without murmurs, rubs, or gallops.  Abdomen: Soft, non-tender, non-distended with normoactive bowel sounds. No hepatomegaly. No rebound/guarding. No obvious abdominal masses. Msk:  Strength and tone appear normal for age. Extremities: No clubbing or cyanosis. No edema.  Distal pedal pulses are 2+ and equal bilaterally. Neuro: Alert and oriented X 3. Moves all extremities spontaneously. Psych:  Responds to questions appropriately with a normal affect.    Intake/Output Summary (Last 24 hours) at 12/17/11 0950 Last data filed at 12/17/11 0817  Gross per 24 hour  Intake    723 ml  Output    550 ml  Net    173 ml    Inpatient Medications:    . carvedilol  12.5 mg Oral BID WC  . cephALEXin  500 mg Oral Q6H  . [COMPLETED] furosemide  40 mg Intravenous Once  . furosemide  40 mg Intravenous BID  . losartan  25 mg Oral Daily  . potassium chloride  10 mEq Oral BID  . sodium chloride  3 mL Intravenous Q12H    Labs:  [facility] 12/17/11 0550 12/16/11 0857  NA 138 138  K 3.7 3.8  CL 100 102  CO2 28 27  GLUCOSE 90 104*  BUN 15 15  CREATININE 1.04 0.99  CALCIUM 8.7 8.8  MG -- --  PHOS -- --    Basename 12/16/11 0857  WBC 11.0*  NEUTROABS  8.3*  HGB 13.7  HCT 42.2  MCV 86.3  PLT 195    Basename 12/16/11 2023 12/16/11 1632 12/16/11 0858  CKTOTAL -- -- --  CKMB -- -- --  TROPONINI <0.30 <0.30 <0.30   Radiology/Studies:  Dg Chest 2 View 12/16/2011  *RADIOLOGY REPORT*  Clinical Data: Shortness of breath, cough, weakness.  CHEST - 2 VIEW  Comparison: 06/14/2009  Findings: Cardiomegaly with vascular congestion.  Slight interstitial prominence.  Cannot exclude early interstitial edema. Linear bibasilar densities, likely atelectasis.  No visible effusions.  No acute bony abnormality.  IMPRESSION: Cardiomegaly with vascular congestion.  Question early interstitial edema.  Bibasilar atelectasis.   Original Report Authenticated By: Charlett Nose, M.D.      Assessment and Plan  1. Acute on chronic systolic CHF/NICM - weight down 3 lbs and reported ++diuresis.  2. SOB/chest discomfort, possibly related to #1. Negative CE's, hx of normal coronary arteries 2008. Therapeutic INR with negative d-dimer, not tachycardic. Repeat PE felt unlikely. 3. PSVT- may be precipitating #1. Patient interested in discussing RFA. Will need EP f/u as OP. Verapamil d/c'd due to LV dysfunction. 4. H/o recurrent DVT/PE- continue Coumadin.  5. H/o apical thrombus- continue Coumadin. Repeat echo pending.   6. Morbid obesity- weight loss has been encouraged. However, SVT has occurred in the setting of exercise further supporting OP EP eval for RFCA.  7. LLE cellulitis- calor and erythema appreciated on exam. Neg LE duplex. Possible source of transient fever. Continue Keflex.  Signed, Ronie Spies PA-C Agree with assessment above. Clinically he has responded well to IV lasix.  He was on 40 mg daily at home which was inadequate. Will switch to 40 mg BID at discharge. Venous dopplers negative for DVT. Awaiting echo today. If satisfactory he may be able to go home later today and follow up with Dr. [name] or NP/PA in office next week. Will allow ambulation in hall  today.

## 2011-12-17 NOTE — Progress Notes (Signed)
Per Dr. Patty Sermons, EF 25-30% by echo (last assessment 45% in 2011). Due to drop in LV function he is recommending changing status to inpatient & obtaining CHF team consult. Patient made aware that he will be seeing CHF team today. Marjon Doxtater PA-C

## 2011-12-17 NOTE — Progress Notes (Signed)
I cosign Jessica Harraway, RN's assessment, med administration, notes, I/O, and care plan/education. 

## 2011-12-17 NOTE — Progress Notes (Signed)
UR Completed Emberlyn Burlison Graves-Bigelow, RN,BSN [phone]  

## 2011-12-17 NOTE — Progress Notes (Signed)
  Echocardiogram 2D Echocardiogram has been performed.  Roberto [name] 12/17/2011, 11:16 AM

## 2011-12-17 NOTE — Consult Note (Signed)
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 (Mom has clotting disorder) He has not had a work up for clotting problems.    He has been followed by Dr [name] for NICM and SVT. First evaluated by Dr [name] in 2007 for SVT following knew surgery which was complicated by DVT and PE. Later he had Myoview 06/2006 which revealed EF 32% anterior, apical and inferior scar with inferior peri-infarct ischemia. This was followed by a LHC which revealed normal coronaries. ECHO 08/2009 revealed EF 45%, apical thrombus and moderate LAE. He elected to continue medical therapy for SVT but he admits he later stopped verapamil due to fatigue.    He complains of progressive dyspnea over the 2 months. Complains of dyspnea while ironing clothes and he is unable to walk to back of grocery store. His says that he has periods of apnea. (Sleep study completed 2 years ago and was normal). He has been able to exercise 4 days a week, 50 minutes at time but  noticed increased fatigue. Denies ETOH/recreational use. Lives at home with wife and 4 children. Works full time at [employer] in [place].    He was evaluated by PCP 12/15/11 and verapamil was restarted due to intermittent tachycardia. The next day he presented to [facility] ED for progressive dyspnea , chest tightness and lower extremity edema. Admit weight  331 pounds. Pertinent admission labs include: D-dimer WNL, INR 3.17, creatinine 0.99, Potassium 3.8, WBC 11.0, troponin < 0.3, TSH 1.566and Pro BNP 594.5. He was placed on lasix 40 mg IV bid. 12/16/11 DVT- No evidence of acute deep vein thrombosis  involving the left lower extremity. Chronic changes are noted in the left femoral and popliteal veins from previous thrombosis.  We were asked to provide HF consult due to reduction in EF from 40% and now 25%.    Complains of dyspnea with exertion. + Orthopnea  Review of Systems: [y] = yes, [ ]  = no   General: Weight gain [ ] ; Weight loss [ ] ; Anorexia [ ] ; Fatigue [Y ]; Fever [ ] ; Chills [ ] ; Weakness [ ]   Cardiac: Chest pain/pressure [Y ]; Resting SOB [ ] ; Exertional SOB [ Y]; Orthopnea [Y ]; Pedal Edema [Y ]; Palpitations [ ] ; Syncope [ ] ; Presyncope [ ] ; Paroxysmal nocturnal dyspnea[ ]   Pulmonary: Cough Gilian.Kraft ]; Wheezing[ ] ; Hemoptysis[ ] ; Sputum [ ] ; Snoring [ ]   GI: Vomiting[ ] ; Dysphagia[ ] ; Melena[ ] ; Hematochezia [ ] ; Heartburn[ ] ; Abdominal pain [ ] ; Constipation [ ] ; Diarrhea [ ] ; BRBPR [ ]   GU: Hematuria[ ] ; Dysuria [ ] ; Nocturia[ ]   Vascular: Pain in legs with walking [ ] ; Pain in feet with lying flat [ ] ; Non-healing sores [ ] ; Stroke [ ] ; TIA [ ] ; Slurred speech [ ] ;  Neuro: Headaches[ ] ; Vertigo[ ] ; Seizures[ ] ; Paresthesias[ ] ;Blurred vision [ ] ; Diplopia [ ] ; Vision changes [ ]   Ortho/Skin: Arthritis [ ] ; Joint pain [ ] ; Muscle pain [ ] ; Joint swelling [ Y]; Back Pain [ ] ; Rash [ ]   Psych: Depression[ ] ; Anxiety[ ]   Heme: Bleeding problems [ ] ; Clotting disorders [ ] ; Anemia [ ]   Endocrine: Diabetes [ ] ; Thyroid dysfunction[ ]   Home Medications Prior to Admission medications   Medication Sig Start Date End Date Taking? Authorizing Provider  carvedilol (COREG) 12.5 MG tablet Take 1 tablet (12.5 mg total) by mouth 2 (two) times daily with a meal. 01/06/11  Yes [name], MD  furosemide (LASIX) 40 MG tablet Take 1 tablet (40 mg total) by mouth daily. 01/06/11  Yes [name], MD  losartan (COZAAR) 25 MG tablet Take 1 tablet (25 mg total) by mouth daily. 01/06/11  Yes [name], MD  nitroGLYCERIN (NITROSTAT) 0.4 MG SL tablet Place 0.4 mg under the tongue every 5  (five) minutes as needed.     Yes Historical Provider, MD  potassium chloride (K-DUR) 10 MEQ tablet Take 10 mEq by mouth daily.   Yes Historical Provider, MD  verapamil (CALAN) 80 MG tablet Take 1 tablet (80 mg total) by mouth 3 (three) times daily. Take only with break through SVT 12/15/11  Yes Kermit Balo Tysinger, PA  warfarin (COUMADIN) 4 MG tablet Take 4 mg by mouth daily. Take with coumadin 5mg    Yes Historical Provider, MD  warfarin (COUMADIN) 5 MG tablet Take 5 mg by mouth daily. Take with coumadin 4mg    Yes Historical Provider, MD    Past Medical History: Past Medical History  Diagnosis Date  . Mural thrombus of heart     coumadin  . Pulmonary embolism     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  . SVT (supraventricular tachycardia)   . Obesity   . Arthritis   . Nonischemic cardiomyopathy     06/2006 cath: angiographically normal coronaries  . Deep vein thrombosis     Past Surgical History: Past Surgical History  Procedure Date  . Patellar tendon repair     Left    Family History: Family History  Problem Relation Age of Onset  . Hypertension Mother   . Clotting disorder Mother     mom with PEs  . Diabetes Father   . Heart attack Father   . Hypertension Other     Sibling  . Diabetes Other   . Obesity Other     Social History: History   Social History  . Marital Status: Married    Spouse Name: N/A    Number of Children: 5  . Years of Education: N/A   Occupational History  . Oceanographer for [employer]    Social History Main Topics  . Smoking status: Never Smoker   . Smokeless tobacco: None  . Alcohol Use: Yes     Comment: Rarely  . Drug Use: No  . Sexually Active: None   Other Topics Concern  . None   Social History Narrative   Lives in [place] with 5 offspring    Allergies:  Allergies  Allergen Reactions  . Shrimp (Shellfish Allergy) Hives and Itching    Objective:    Vital Signs:   Temp:  [98 F  (36.7 C)-100.3 F (37.9 C)] 98.4 F (36.9 C) (12/04 1345) Pulse Rate:  [78-92] 84  (12/04 1345) Resp:  [18-22] 20  (12/04 1345) BP: (102-121)/(51-76) 103/51 mmHg (12/04 1345) SpO2:  [94 %-96 %] 94 % (12/04 1345) Weight:  [328 lb (148.78 kg)-331 lb 9.2 oz (150.4 kg)] 328 lb (148.78 kg) (12/04 0535) Last BM Date: 12/16/11  Weight change: Filed Weights   12/16/11 0819 12/16/11 1442 12/17/11 0535  Weight: 320 lb (145.151 kg) 331 lb 9.2 oz (  150.4 kg) 328 lb (148.78 kg)    Intake/Output:   Intake/Output Summary (Last 24 hours) at 12/17/11 1417 Last data filed at 12/17/11 1304  Gross per 24 hour  Intake    843 ml  Output      0 ml  Net    843 ml     Physical Exam:  General:  Well appearing. No resp difficulty wife present HEENT: normal Neck: supple. JVP 12 cm. Carotids 2+ bilat; no bruits. No lymphadenopathy or thryomegaly appreciated. Cor: PMI nondisplaced. Regular rate & rhythm. No rubs, gallops or murmurs. Lungs: clear  Abdomen: obese, soft, nontender, nondistended. No hepatosplenomegaly. No bruits or masses. Good bowel sounds. Extremities: no cyanosis, clubbing, rash, LLE hyperpigmentation 2+ edema. RLE trace edema Neuro: alert & orientedx3, cranial nerves grossly intact. moves all 4 extremities w/o difficulty. Affect pleasant  Telemetry: SR 70-80s  Labs: Basic Metabolic Panel:  Lab 12/17/11 8295 12-18-2011 0857  NA 138 138  K 3.7 3.8  CL 100 102  CO2 28 27  GLUCOSE 90 104*  BUN 15 15  CREATININE 1.04 0.99  CALCIUM 8.7 8.8  MG -- --  PHOS -- --    Liver Function Tests: No results found for this basename: AST:5,ALT:5,ALKPHOS:5,BILITOT:5,PROT:5,ALBUMIN:5 in the last 168 hours No results found for this basename: LIPASE:5,AMYLASE:5 in the last 168 hours No results found for this basename: AMMONIA:3 in the last 168 hours  CBC:  Lab December 18, 2011 0857  WBC 11.0*  NEUTROABS 8.3*  HGB 13.7  HCT 42.2  MCV 86.3  PLT 195    Cardiac Enzymes:  Lab December 18, 2011 2023  2011-12-18 1632 12/18/11 0858  CKTOTAL -- -- --  CKMB -- -- --  CKMBINDEX -- -- --  TROPONINI <0.30 <0.30 <0.30    BNP: BNP (last 3 results)  Basename 18-Dec-2011 0858  PROBNP 594.5*    CBG: No results found for this basename: GLUCAP:5 in the last 168 hours  Coagulation Studies:  Basename 12/17/11 0550 2011-12-18 0857 12/15/11 0854  LABPROT 30.3* 30.8* 29.5*  INR 3.10* 3.17* 3.08*    Other results: EKG:  Imaging: Dg Chest 2 View  12-18-11  *RADIOLOGY REPORT*  Clinical Data: Shortness of breath, cough, weakness.  CHEST - 2 VIEW  Comparison: 06/14/2009  Findings: Cardiomegaly with vascular congestion.  Slight interstitial prominence.  Cannot exclude early interstitial edema. Linear bibasilar densities, likely atelectasis.  No visible effusions.  No acute bony abnormality.  IMPRESSION: Cardiomegaly with vascular congestion.  Question early interstitial edema.  Bibasilar atelectasis.   Original Report Authenticated By: Charlett Nose, M.D.       Medications:     Current Medications:    . carvedilol  12.5 mg Oral BID WC  . cephALEXin  500 mg Oral Q6H  . furosemide  80 mg Intravenous BID  . losartan  25 mg Oral Daily  . potassium chloride  10 mEq Oral BID  . sodium chloride  3 mL Intravenous Q12H  . spironolactone  12.5 mg Oral Daily  . warfarin  8 mg Oral ONCE-1800  . Warfarin - Pharmacist Dosing Inpatient   Does not apply q1800  . [DISCONTINUED] furosemide  40 mg Intravenous BID     Infusions:      Assessment:  1. A/C systolic heart failure  EF 25% 2. A/C respiratory failure  3. PSVT-  4. H/O DVT/PE  5. H/O apical thrombus  On chronic coumadin 6. Morbid Obesity 7. 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  NYHA IIIB. Complains of dyspnea with exertion. EF reduced to 25%-30% (grade 3 diastolic dysfunction) from previous 35-40% (2010). I am not sure if reduced EF is from intermittent tachycardia. He has maintained NSR in 70-80s. Pro BNP not high however this can be effected by obesity.  Volume difficult to assess due to body habitus but likely elevated. Increase Lasix to 80 mg IV bid. Continue losartan and carvedilol at current dose. Add 12.5 mg  spironolactone. Add digoxin 0.125 mg daily. Will need RHC to further assess hemodynamics. Hold coumadin for cath Thurs or Fri. Consult dietitian for HF education. Check SPEP, UPEP and HIV in am . Consult [facility] for HF follow up.  Given clotting problems may benefit from hematology work up.   Coumadin per pharmacy. INR 3.1   Cellulitis Day #2 of keflex.   Cardiac rehab appreciated. O2 sats 90% on room air. Placed on 2 liters [place] and O2 sats increased to 92%. Ambulated down hall but complained of dyspnea.   Once discharged will need outpatient sleep study and event monitor and follow up with Dr Johney Frame.   Length of Stay: 1  CLEGG,AMY NP-C 12/17/2011, 2:17 PM  Patient seen with NP, agree with the above note.   1. Acute on chronic systolic CHF: EF 16% with global hypokinesis.  He is volume overloaded on exam and short of breath with exertion.  I am unsure about the underlying caweruse of cardiomyopathy.  He had cath in 2008 with normal coronaries, EF was 45% at that time.  Most likely this is a nonischemic cardiomyopathy.   - He will need serological workup with ANA, serum/urine immunofixation, HIV test.   - Cardiac MRI to assess for infiltrative disease, LV thrombus, LV noncompaction.  Will do this tomorrow.  - Agree with increasing Lasix to 80 mg IV bid, follow I/Os and weights => he is volume overloaded and needs significant diuresis.  - Can continue Coreg and losartan at current doses, adding spironolactone 12.5 daily and digoxin 0.125 daily.  -  Would get right and left heart cath => hold coumadin for now, cath when INR 1.8 or less (would do right and left from brachial and radial access).  Has not had VTE for a number of years.   2. History of LV thrombus: Not seen on recent echo, apparently seen on echo in 8/11.  Interestingly, EF was 45% when LV thrombus was noted. This is unusual and suggests clotting disorder or possibly LV noncompaction.  As above, getting cardiac MRI to assess for LV noncompaction, LV thrombus, etc.  If no thrombus on MRI, can go forward with caths as above but minimize time with subtherapeutic INR.  3. History of VTE: Had DVT after surgery, but had a second episode not related to surgery.  Suspect some form of clotting disorder.  Will send labs to workup clotting disorder.   4. SVT: So far, no significant findings on telemetry.  Would hold off on verapamil.  On Coreg.   Marca Ancona 12/17/2011 5:16 PM

## 2011-12-18 ENCOUNTER — Inpatient Hospital Stay (HOSPITAL_COMMUNITY): Payer: BC Managed Care – PPO

## 2011-12-18 ENCOUNTER — Ambulatory Visit: Payer: BC Managed Care – PPO | Admitting: Family Medicine

## 2011-12-18 DIAGNOSIS — I5043 Acute on chronic combined systolic (congestive) and diastolic (congestive) heart failure: Principal | ICD-10-CM

## 2011-12-18 DIAGNOSIS — I428 Other cardiomyopathies: Secondary | ICD-10-CM

## 2011-12-18 LAB — BASIC METABOLIC PANEL
BUN: 15 mg/dL (ref 6–23)
Calcium: 8.6 mg/dL (ref 8.4–10.5)
Creatinine, Ser: 1.09 mg/dL (ref 0.50–1.35)
GFR calc Af Amer: >90 mL/min (ref >90)
GFR calc non Af Amer: 80 mL/min — ABNORMAL LOW (ref >90)

## 2011-12-18 LAB — CBC
MCH: 27.5 pg (ref 26.0–34.0)
MCHC: 31.7 g/dL (ref 30.0–36.0)
MCV: 86.7 fL (ref 78.0–100.0)
Platelets: 171 10*3/uL (ref 150–400)
RBC: 4.83 MIL/uL (ref 4.22–5.81)

## 2011-12-18 LAB — PROTIME-INR: Prothrombin Time: 25 seconds — ABNORMAL HIGH (ref 11.6–15.2)

## 2011-12-18 LAB — HIV ANTIBODY (ROUTINE TESTING W REFLEX): HIV: NONREACTIVE

## 2011-12-18 MED ORDER — GADOBENATE DIMEGLUMINE 529 MG/ML IV SOLN
30.0000 mL | Freq: Once | INTRAVENOUS | Status: AC
  Administered 2011-12-18: 30 mL via INTRAVENOUS

## 2011-12-18 NOTE — Plan of Care (Signed)
Problem: Phase I Progression Outcomes Goal: EF % per last Echo/documented,Core Reminder form on chart Outcome: Completed/Met Date Met:  12/18/11 EF = 25-30% per ECHO performed on 12/17/11.

## 2011-12-18 NOTE — Progress Notes (Signed)
ANTICOAGULATION CONSULT NOTE - Follow-up Consult  Pharmacy Consult for warfarin Indication: Hx of recurrent DVT/PE & Hx of apical thrombus  Labs:  Basename 12/18/11 0515 12/17/11 0550 12/16/11 2023 12/16/11 1632 12/16/11 0858 12/16/11 0857  HGB 13.3 -- -- -- -- 13.7  HCT 41.9 -- -- -- -- 42.2  PLT 171 -- -- -- -- 195  APTT -- -- -- -- -- --  LABPROT 25.0* 30.3* -- -- -- 30.8*  INR 2.39* 3.10* -- -- -- 3.17*  HEPARINUNFRC -- -- -- -- -- --  CREATININE 1.09 1.04 -- -- -- 0.99  CKTOTAL -- -- -- -- -- --  CKMB -- -- -- -- -- --  TROPONINI -- -- <0.30 <0.30 <0.30 --    Estimated Creatinine Clearance: 119.7 ml/min (by C-G formula based on Cr of 1.09).   Assessment: 46 yo male with PMHx s/f NICM, hx recurrent DVT with resultant PE and chronic venous insufficiency, hx LV thrombus, PSVT & morbid obesity. Patient on warfarin PTA, managed by Dr. Susann Givens.  Per Dr. Ludwig Clarks note on 12/5, warfarin is to be held until INR is <1.6 so patient can go to cath. INR this morning 2.39. CBC good.   Goal of Therapy:  INR 2.5-3.0 (per Crosby Oyster, [facility] @ Dr. Jola Babinski office) Monitor platelets by anticoagulation protocol: Yes   Plan:  1. Hold warfarin tonight 2. Daily CBC and INR 3. Follow up warfarin restart plans   Jacquese Hackman D. Ryan Palermo, PharmD Clinical Pharmacist Pager: [phone] 12/18/2011 10:20 AM

## 2011-12-18 NOTE — Plan of Care (Signed)
Problem: Food- and Nutrition-Related Knowledge Deficit (NB-1.1) Goal: Nutrition education Formal process to instruct or train a patient/client in a skill or to impart knowledge to help patients/clients voluntarily manage or modify food choices and eating behavior to maintain or improve health.  Outcome: Completed/Met Date Met:  12/18/11 Nutrition Education Note  RD consulted for nutrition education regarding new onset CHF.  RD provided "Heart Healthy Nutrition Therapy" handout from the Academy of Nutrition and Dietetics. Reviewed patient's dietary recall. Provided examples on ways to decrease sodium intake in diet. Discouraged intake of processed foods and use of salt shaker. Encouraged fresh fruits and vegetables as well as whole grain sources of carbohydrates to maximize fiber intake.   RD discussed why it is important for patient to adhere to diet recommendations, and emphasized the role of fluids, foods to avoid, and importance of weighing self daily. Teach back method used.  Expect good compliance.  Body mass index is 49.31 kg/(m^2). Pt meets criteria for morbid obesity based on current BMI.  Current diet order is NPO for procedure, patient is consuming approximately 100% of meals at this time. Labs and medications reviewed. No further nutrition interventions warranted at this time. RD contact information provided. If additional nutrition issues arise, please re-consult RD.   Clarene Duke RD, LDN Pager [phone] After Hours pager [phone]

## 2011-12-18 NOTE — Progress Notes (Signed)
CARDIAC REHAB PHASE I   PRE:  Rate/Rhythm: 73SR  BP:  Supine:   Sitting: 95/64  Standing:    SaO2: 97%RA  MODE:  Ambulation: 700 ft   POST:  Rate/Rhythem: 90  BP:  Supine:   Sitting: 111/69  Standing:    SaO2: 95% hall, 93% room 1312-1337 Pt walked 700 ft with steady gait. No SOB noted. Tolerated well. Sats good on RA. Pt with company. Left low sodium diet sheets for pt to read over later.  Duanne Limerick

## 2011-12-18 NOTE — Progress Notes (Signed)
1050 Pt going for MRI now. Came to see pt. Will follow up. Ashwini Jago DunlapRN

## 2011-12-18 NOTE — Progress Notes (Addendum)
  Patient: Roberto [name] / Admit Date: 12/16/2011 / Date of Encounter: 12/18/2011, 6:41 AM   Subjective  Patient's dyspnea is improving; no chest pain   Objective   Telemetry: NSR occ multifocal PVCs, couplets Physical Exam: Filed Vitals:   12/18/11 0509  BP: 104/62  Pulse: 76  Temp: 98.5 F (36.9 C)  Resp: 20   General: Well developed, obese Head: Normal Neck: Supple Lungs: Diminished BS bases Heart: RRR S1 S2 without murmurs, rubs, or gallops.  Abdomen: Soft, non-tender, non-distended  Extremities: 1+ edema Neuro: Alert and oriented X 3. Moves all extremities spontaneously.    Intake/Output Summary (Last 24 hours) at 12/18/11 0641 Last data filed at 12/17/11 2052  Gross per 24 hour  Intake    844 ml  Output    675 ml  Net    169 ml    Inpatient Medications:     . carvedilol  12.5 mg Oral BID WC  . cephALEXin  500 mg Oral Q6H  . digoxin  0.125 mg Oral Daily  . furosemide  80 mg Intravenous BID  . losartan  25 mg Oral Daily  . potassium chloride  10 mEq Oral BID  . sodium chloride  3 mL Intravenous Q12H  . spironolactone  12.5 mg Oral Daily  . Warfarin - Pharmacist Dosing Inpatient   Does not apply q1800  . [DISCONTINUED] furosemide  40 mg Intravenous BID  . [DISCONTINUED] furosemide  80 mg Intravenous BID  . [DISCONTINUED] warfarin  8 mg Oral ONCE-1800    Labs:  [facility] 12/17/11 0550 12/16/11 0857  NA 138 138  K 3.7 3.8  CL 100 102  CO2 28 27  GLUCOSE 90 104*  BUN 15 15  CREATININE 1.04 0.99  CALCIUM 8.7 8.8  MG -- --  PHOS -- --    Basename 12/18/11 0515 12/16/11 0857  WBC 11.8* 11.0*  NEUTROABS -- 8.3*  HGB 13.3 13.7  HCT 41.9 42.2  MCV 86.7 86.3  PLT 171 195    Basename 12/16/11 2023 12/16/11 1632 12/16/11 0858  CKTOTAL -- -- --  CKMB -- -- --  TROPONINI <0.30 <0.30 <0.30   Radiology/Studies:  Dg Chest 2 View 12/16/2011  *RADIOLOGY REPORT*  Clinical Data: Shortness of breath, cough, weakness.  CHEST - 2 VIEW  Comparison: 06/14/2009   Findings: Cardiomegaly with vascular congestion.  Slight interstitial prominence.  Cannot exclude early interstitial edema. Linear bibasilar densities, likely atelectasis.  No visible effusions.  No acute bony abnormality.  IMPRESSION: Cardiomegaly with vascular congestion.  Question early interstitial edema.  Bibasilar atelectasis.   Original Report Authenticated By: Charlett Nose, M.D.      Assessment and Plan  1. Acute on chronic systolic CHF/NICM  2. PSVT 3. H/o recurrent DVT/PE 4. H/o apical thrombus 5. Morbid obesity 6. LLE cellulitis  Echo shows worsening of LV function; etiology unclear; coumadin on hold and will plan R and L cath when INR < 1.6. Cardiac MRI ordered; ANA and HIV pending. ? If CM related to recurrent SVT (tachycardia mediated). Outpatient EP eval for consideration of ablation. Continue present dose of ARB and coreg; continue diuresis and follow renal function. Continue short course of keflex for lower ext cellulitis. Olga Millers 6:51 AM

## 2011-12-19 DIAGNOSIS — I471 Supraventricular tachycardia: Secondary | ICD-10-CM

## 2011-12-19 LAB — BASIC METABOLIC PANEL
CO2: 31 mEq/L (ref 19–32)
Chloride: 99 mEq/L (ref 96–112)
GFR calc Af Amer: >90 mL/min (ref >90)
Potassium: 3.4 mEq/L — ABNORMAL LOW (ref 3.5–5.1)

## 2011-12-19 LAB — UIFE/LIGHT CHAINS/TP QN, 24-HR UR
Alpha 2, Urine: DETECTED — AB
Free Kappa Lt Chains,Ur: 1.27 mg/dL (ref 0.14–2.42)
Free Kappa/Lambda Ratio: 5.08 ratio (ref 2.04–10.37)
Free Lambda Lt Chains,Ur: 0.25 mg/dL (ref 0.02–0.67)
Total Protein, Urine: 3 mg/dL

## 2011-12-19 LAB — LUPUS ANTICOAGULANT PANEL
DRVVT: 59.3 secs — ABNORMAL HIGH (ref <42.9)
PTT Lupus Anticoagulant: 53.2 secs — ABNORMAL HIGH (ref 28.0–43.0)
PTTLA Confirmation: 16 secs — ABNORMAL HIGH (ref <8.0)
dRVVT Incubated 1:1 Mix: 33.2 secs (ref <42.9)

## 2011-12-19 LAB — ANA: Anti Nuclear Antibody(ANA): NEGATIVE

## 2011-12-19 LAB — PROTIME-INR: Prothrombin Time: 21.9 seconds — ABNORMAL HIGH (ref 11.6–15.2)

## 2011-12-19 LAB — CARDIOLIPIN ANTIBODIES, IGG, IGM, IGA
Anticardiolipin IgA: 11 APL U/mL — ABNORMAL LOW (ref <22)
Anticardiolipin IgM: 2 MPL U/mL — ABNORMAL LOW (ref <11)

## 2011-12-19 LAB — BETA-2-GLYCOPROTEIN I ABS, IGG/M/A: Beta-2 Glyco I IgG: 1 G Units (ref <20)

## 2011-12-19 MED ORDER — POTASSIUM CHLORIDE CRYS ER 20 MEQ PO TBCR
40.0000 meq | EXTENDED_RELEASE_TABLET | Freq: Once | ORAL | Status: AC
  Administered 2011-12-19: 40 meq via ORAL
  Filled 2011-12-19: qty 2

## 2011-12-19 NOTE — Progress Notes (Signed)
  Patient: Roberto [name] / Admit Date: 12/16/2011 / Date of Encounter: 12/19/2011, 7:59 AM   Subjective  Patient's dyspnea is improving; no chest pain   Objective   Telemetry: NSR occ multifocal PVCs, couplets Physical Exam: Filed Vitals:   12/19/11 0611  BP: 104/58  Pulse: 72  Temp: 98.2 F (36.8 C)  Resp: 20   General: Well developed, obese Head: Normal Neck: Supple Lungs: Diminished BS bases Heart: RRR S1 S2 without murmurs, rubs, or gallops.  Abdomen: Soft, non-tender, non-distended  Extremities: trace edema, LLE with decreased warmth Neuro: Alert and oriented X 3. Moves all extremities spontaneously.    Intake/Output Summary (Last 24 hours) at 12/19/11 0759 Last data filed at 12/19/11 0700  Gross per 24 hour  Intake    624 ml  Output    425 ml  Net    199 ml    Inpatient Medications:     . carvedilol  12.5 mg Oral BID WC  . cephALEXin  500 mg Oral Q6H  . digoxin  0.125 mg Oral Daily  . furosemide  80 mg Intravenous BID  . [COMPLETED] gadobenate dimeglumine  30 mL Intravenous Once  . losartan  25 mg Oral Daily  . potassium chloride  10 mEq Oral BID  . sodium chloride  3 mL Intravenous Q12H  . spironolactone  12.5 mg Oral Daily  . Warfarin - Pharmacist Dosing Inpatient   Does not apply q1800    Labs:  [facility] 12/19/11 0630 12/18/11 0515  NA 139 138  K 3.4* 3.8  CL 99 99  CO2 31 27  GLUCOSE 90 93  BUN 15 15  CREATININE 1.06 1.09  CALCIUM 8.7 8.6  MG -- --  PHOS -- --    Basename 12/18/11 0515 12/16/11 0857  WBC 11.8* 11.0*  NEUTROABS -- 8.3*  HGB 13.3 13.7  HCT 41.9 42.2  MCV 86.7 86.3  PLT 171 195    Basename 12/16/11 2023 12/16/11 1632 12/16/11 0858  CKTOTAL -- -- --  CKMB -- -- --  TROPONINI <0.30 <0.30 <0.30   Radiology/Studies:  Dg Chest 2 View 12/16/2011  *RADIOLOGY REPORT*  Clinical Data: Shortness of breath, cough, weakness.  CHEST - 2 VIEW  Comparison: 06/14/2009  Findings: Cardiomegaly with vascular congestion.  Slight  interstitial prominence.  Cannot exclude early interstitial edema. Linear bibasilar densities, likely atelectasis.  No visible effusions.  No acute bony abnormality.  IMPRESSION: Cardiomegaly with vascular congestion.  Question early interstitial edema.  Bibasilar atelectasis.   Original Report Authenticated By: Charlett Nose, M.D.      Assessment and Plan  1. Acute on chronic systolic CHF/NICM  2. PSVT 3. H/o recurrent DVT/PE 4. H/o apical thrombus 5. Morbid obesity 6. LLE cellulitis 7. Hypokalemia  Echo shows worsening of LV function; etiology unclear; coumadin on hold and will plan R and L cath when INR < 1.6 (most likely Monday); Add IV heparin when INR < 2. Cardiac MRI results pending; HIV and TSH normal. ? If CM related to recurrent SVT (tachycardia mediated). Outpatient EP eval for consideration of ablation. Continue present dose of ARB and coreg; continue diuresis and follow renal function; would change to po lasix in AM. Continue short course of keflex for lower ext cellulitis (complete 7 days). Olga Millers 7:59 AM

## 2011-12-19 NOTE — Progress Notes (Signed)
Pt's lab collected 12/18/11 showed Lupus detected.  Result called in by Lucrezia Europe from lab.  Informed Grier Mitts, PA.  No new order.  Amanda Pea, Charity fundraiser.

## 2011-12-19 NOTE — Progress Notes (Signed)
ANTICOAGULATION CONSULT NOTE - Follow-up Consult  Pharmacy Consult for warfarin & heparin Indication: Hx of recurrent DVT/PE & Hx of apical thrombus  Labs:  Basename 12/19/11 0630 12/18/11 0515 12/17/11 0550 12/16/11 2023 12/16/11 1632  HGB -- 13.3 -- -- --  HCT -- 41.9 -- -- --  PLT -- 171 -- -- --  APTT -- -- -- -- --  LABPROT 21.9* 25.0* 30.3* -- --  INR 2.00* 2.39* 3.10* -- --  HEPARINUNFRC -- -- -- -- --  CREATININE 1.06 1.09 1.04 -- --  CKTOTAL -- -- -- -- --  CKMB -- -- -- -- --  TROPONINI -- -- -- <0.30 <0.30    Estimated Creatinine Clearance: 122.8 ml/min (by C-G formula based on Cr of 1.06).   Assessment: 46 yo male with PMHx s/f NICM, hx recurrent DVT with resultant PE and chronic venous insufficiency, hx LV thrombus, PSVT & morbid obesity. Patient on warfarin PTA, managed by Dr. Susann Givens. Warfarin on hold in anticipation of R & L  Cath (when INR is <1.6) and start heparin when INR < 2. INR is 2.0 this morning -- per Dr. [name], not necessary to recheck INR later today -- can follow-up INR with AM labs tomorrow to start heparin in the morning.  Goal of Therapy:  INR 2.5-3.0 (per Crosby Oyster, [facility] @ Dr. Jola Babinski office) Heparin level 0.3 - 0.7 units/ml Monitor platelets by anticoagulation protocol: Yes   Plan:  1. No warfarin tonight 2. Follow-up INR with AM labs & start heparin when INR <2 2. Daily CBC and INR 3. Follow up warfarin resumption post-cath  Benjaman Pott, PharmD    12/19/2011   9:50 AM

## 2011-12-19 NOTE — Progress Notes (Signed)
CARDIAC REHAB PHASE I   PRE:  Rate/Rhythm: 48 Sr with PVC    BP: sitting 97/75    SaO2: 93 RA  MODE:  Ambulation: 700 ft   POST:  Rate/Rhythm: 81    BP: sitting 103/65     SaO2: 97 RA  Tolerated well. No c/o. Interested in CRPII. Will need referral written as he is a CHF pt. Will f/u but can walk independently. [phone]  Harriet Masson CES, ACSM

## 2011-12-20 LAB — HEPARIN LEVEL (UNFRACTIONATED): Heparin Unfractionated: 0.28 IU/mL — ABNORMAL LOW (ref 0.30–0.70)

## 2011-12-20 MED ORDER — HEPARIN BOLUS VIA INFUSION
3000.0000 [IU] | Freq: Once | INTRAVENOUS | Status: AC
  Administered 2011-12-20: 3000 [IU] via INTRAVENOUS
  Filled 2011-12-20: qty 3000

## 2011-12-20 MED ORDER — HEPARIN (PORCINE) IN NACL 100-0.45 UNIT/ML-% IJ SOLN
2300.0000 [IU]/h | INTRAMUSCULAR | Status: DC
  Administered 2011-12-20: 1700 [IU]/h via INTRAVENOUS
  Administered 2011-12-20 (×2): 2050 [IU]/h via INTRAVENOUS
  Administered 2011-12-21: 2200 [IU]/h via INTRAVENOUS
  Administered 2011-12-21 (×2): 2300 [IU]/h via INTRAVENOUS
  Filled 2011-12-20 (×6): qty 250

## 2011-12-20 NOTE — Progress Notes (Signed)
ANTICOAGULATION CONSULT NOTE - Follow Up Consult  Pharmacy Consult for heparin Indication: h/o apical thrombus and recurrent PE/DVT  Labs:  Basename 12/20/11 2228 12/20/11 1415 12/20/11 0435 12/19/11 0630 12/18/11 0515  HGB -- -- -- -- 13.3  HCT -- -- -- -- 41.9  PLT -- -- -- -- 171  APTT -- -- -- -- --  LABPROT -- -- 17.6* 21.9* 25.0*  INR -- -- 1.49 2.00* 2.39*  HEPARINUNFRC 0.28* 0.11* -- -- --  CREATININE -- -- -- 1.06 1.09  CKTOTAL -- -- -- -- --  CKMB -- -- -- -- --  TROPONINI -- -- -- -- --    Assessment: 46yo male remains subtherapeutic on heparin after rate increase though now closer to goal.  Goal of Therapy:  Heparin level 0.3-0.7 units/ml   Plan:  Will increase heparin gtt by 1 unit/kg/hr to 2200 units/hr and check level with am labs.  Colleen Can PharmD BCPS 12/20/2011,11:42 PM

## 2011-12-20 NOTE — Progress Notes (Addendum)
CARDIAC REHAB PHASE I   PRE:  Rate/Rhythm: 57 sinus brady  BP:  Supine:   Sitting: 104/68  Standing:    SaO2: 95%  MODE:  Ambulation: 700 ft   POST:  Rate/Rhythem: 72 sinus   BP:  Supine:   Sitting: 121/81  Standing:    SaO2: 97% 1000-1020  Pt ambualted in hallway without difficulty.  Quick, steady gait.  Pt interested in outpatient cardiac rehab.  Phase II brochure given.  Pt instructed to call insurance to verify coverage.  Pt made aware of cardiac maintenance program if phase II not covered by his policy.  Murna Backer, [place]

## 2011-12-20 NOTE — Progress Notes (Signed)
ANTICOAGULATION CONSULT NOTE - Initial Consult  Pharmacy Consult for heparin Indication: h/o apical thrombus and recurrent PE/DVT  Allergies  Allergen Reactions  . Shrimp (Shellfish Allergy) Hives and Itching    Patient Measurements: Height: 5\' 8"  (172.7 cm) Weight: 319 lb 0.1 oz (144.7 kg) IBW/kg (Calculated) : 68.4  Heparin Dosing Weight: 110kg  Vital Signs: Temp: 98.3 F (36.8 C) (12/07 0537) Temp src: Oral (12/07 0537) BP: 98/41 mmHg (12/07 0537) Pulse Rate: 67  (12/07 0537)  Labs:  Basename 12/20/11 0435 12/19/11 0630 12/18/11 0515  HGB -- -- 13.3  HCT -- -- 41.9  PLT -- -- 171  APTT -- -- --  LABPROT 17.6* 21.9* 25.0*  INR 1.49 2.00* 2.39*  HEPARINUNFRC -- -- --  CREATININE -- 1.06 1.09  CKTOTAL -- -- --  CKMB -- -- --  TROPONINI -- -- --    Estimated Creatinine Clearance: 121.8 ml/min (by C-G formula based on Cr of 1.06).   Medical History: Past Medical History  Diagnosis Date  . Mural thrombus of heart     coumadin  . Pulmonary embolism     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  . SVT (supraventricular tachycardia)   . Obesity   . Arthritis   . Nonischemic cardiomyopathy     06/2006 cath: angiographically normal coronaries  . Deep vein thrombosis     Medications:  Prescriptions prior to admission  Medication Sig Dispense Refill  . carvedilol (COREG) 12.5 MG tablet Take 1 tablet (12.5 mg total) by mouth 2 (two) times daily with a meal.  60 tablet  6  . furosemide (LASIX) 40 MG tablet Take 1 tablet (40 mg total) by mouth daily.  30 tablet  prn  . losartan (COZAAR) 25 MG tablet Take 1 tablet (25 mg total) by mouth daily.  30 tablet  5  . nitroGLYCERIN (NITROSTAT) 0.4 MG SL tablet Place 0.4 mg under the tongue every 5 (five) minutes as needed.        . potassium chloride (K-DUR) 10 MEQ tablet Take 10 mEq by mouth daily.      . verapamil (CALAN) 80 MG tablet Take 1 tablet (80 mg total) by mouth 3 (three) times daily. Take only with break through SVT   90 tablet  2  . warfarin (COUMADIN) 4 MG tablet Take 4 mg by mouth daily. Take with coumadin 5mg       . warfarin (COUMADIN) 5 MG tablet Take 5 mg by mouth daily. Take with coumadin 4mg        Scheduled:    . carvedilol  12.5 mg Oral BID WC  . cephALEXin  500 mg Oral Q6H  . digoxin  0.125 mg Oral Daily  . furosemide  80 mg Intravenous BID  . losartan  25 mg Oral Daily  . potassium chloride  10 mEq Oral BID  . [COMPLETED] potassium chloride  40 mEq Oral Once  . sodium chloride  3 mL Intravenous Q12H  . spironolactone  12.5 mg Oral Daily  . Warfarin - Pharmacist Dosing Inpatient   Does not apply q1800    Assessment: 46yo male awaiting cardiac cath now to start heparin now that INR <2.  Goal of Therapy:  Heparin level 0.3-0.7 units/ml Monitor platelets by anticoagulation protocol: Yes   Plan:  Will begin heparin gtt at 1700 units/hr and monitor heparin levels and CBC.  Colleen Can PharmD BCPS 12/20/2011,6:35 AM

## 2011-12-20 NOTE — Progress Notes (Signed)
Patient ID: Roberto [name], male   DOB: August 30, 1965, 46 y.o.   MRN: [id number]   SUBJECTIVE:The patient is feeling better. He's had modest diuresis. He still has significant edema.Renal function is stable.   Filed Vitals:   12/19/11 1726 12/19/11 2139 12/20/11 0537 12/20/11 0919  BP: 103/67 110/52 98/41   Pulse: 73 74 67 70  Temp:  98.4 F (36.9 C) 98.3 F (36.8 C)   TempSrc:  Oral Oral   Resp:  20 20   Height:      Weight:   319 lb 0.1 oz (144.7 kg)   SpO2:  95% 95%     Intake/Output Summary (Last 24 hours) at 12/20/11 1218 Last data filed at 12/20/11 1100  Gross per 24 hour  Intake   1178 ml  Output   1600 ml  Net   -422 ml    LABS: Basic Metabolic Panel:  Basename 12/19/11 0630 12/18/11 0515  NA 139 138  K 3.4* 3.8  CL 99 99  CO2 31 27  GLUCOSE 90 93  BUN 15 15  CREATININE 1.06 1.09  CALCIUM 8.7 8.6  MG -- --  PHOS -- --   Liver Function Tests: No results found for this basename: AST:2,ALT:2,ALKPHOS:2,BILITOT:2,PROT:2,ALBUMIN:2 in the last 72 hours No results found for this basename: LIPASE:2,AMYLASE:2 in the last 72 hours CBC:  Basename 12/18/11 0515  WBC 11.8*  NEUTROABS --  HGB 13.3  HCT 41.9  MCV 86.7  PLT 171   Cardiac Enzymes: No results found for this basename: CKTOTAL:3,CKMB:3,CKMBINDEX:3,TROPONINI:3 in the last 72 hours BNP: No components found with this basename: POCBNP:3 D-Dimer: No results found for this basename: DDIMER:2 in the last 72 hours Hemoglobin A1C: No results found for this basename: HGBA1C in the last 72 hours Fasting Lipid Panel: No results found for this basename: CHOL,HDL,LDLCALC,TRIG,CHOLHDL,LDLDIRECT in the last 72 hours Thyroid Function Tests: No results found for this basename: TSH,T4TOTAL,FREET3,T3FREE,THYROIDAB in the last 72 hours  RADIOLOGY: Dg Chest 2 View  12/16/2011  *RADIOLOGY REPORT*  Clinical Data: Shortness of breath, cough, weakness.  CHEST - 2 VIEW  Comparison: 06/14/2009  Findings: Cardiomegaly with  vascular congestion.  Slight interstitial prominence.  Cannot exclude early interstitial edema. Linear bibasilar densities, likely atelectasis.  No visible effusions.  No acute bony abnormality.  IMPRESSION: Cardiomegaly with vascular congestion.  Question early interstitial edema.  Bibasilar atelectasis.   Original Report Authenticated By: Charlett Nose, M.D.     PHYSICAL EXAM Patient is oriented to person time and place. Affect is normal. Several family members are in the room. He is sitting up in a chair. There is no jugulovenous distention. There are scattered rhonchi on his lung exam. There is no respiratory distress. Cardiac exam reveals S1 and S2. There no significant murmurs. The abdomen is soft. The patient has 2+ bilateral peripheral edema.   TELEMETRY: I have reviewed telemetry today December 20, 2011. There is normal sinus rhythm. There has been no significant ectopy.   ASSESSMENT AND PLAN:   *Acute on chronic combined systolic and diastolic heart failure      The patient is improving. Renal function is stable. He still has significant edema. I've chosen to leave him on IV Lasix as opposed to changing him to oral.   Nonischemic cardiomyopathy    Patient will have right left heart cath on Monday. The INR will be low enough for him to have On Monday. I will check the cath board.   At this point I cannot access the reading  of the MRI   Paroxysmal SVT (supraventricular tachycardia)     Rhythm is stable.   History of venous thromboembolism    Heparin has been started as the patient's Coumadin is held. INR is down to 1.49   Cellulitis of left lower extremity     Receiving antibiotics  Heparin therapy   Receiving IV heparin for DVT prophylaxis with Coumadin on hold  Willa Rough 12/20/2011 12:18 PM

## 2011-12-20 NOTE — Progress Notes (Signed)
ANTICOAGULATION CONSULT NOTE - follow up  Pharmacy Consult for heparin Indication: bridge therapy for cath Monday 12/9, h/o apical thrombus and recurrent PE/DVT  Allergies  Allergen Reactions  . Shrimp (Shellfish Allergy) Hives and Itching    Patient Measurements: Height: 5\' 8"  (172.7 cm) Weight: 319 lb 0.1 oz (144.7 kg) IBW/kg (Calculated) : 68.4  Heparin Dosing Weight: 110kg  Vital Signs: Temp: 98.2 F (36.8 C) (12/07 1300) Temp src: Oral (12/07 1300) BP: 95/47 mmHg (12/07 1300) Pulse Rate: 69  (12/07 1300)  Labs:  Basename 12/20/11 1415 12/20/11 0435 12/19/11 0630 12/18/11 0515  HGB -- -- -- 13.3  HCT -- -- -- 41.9  PLT -- -- -- 171  APTT -- -- -- --  LABPROT -- 17.6* 21.9* 25.0*  INR -- 1.49 2.00* 2.39*  HEPARINUNFRC 0.11* -- -- --  CREATININE -- -- 1.06 1.09  CKTOTAL -- -- -- --  CKMB -- -- -- --  TROPONINI -- -- -- --    Estimated Creatinine Clearance: 121.8 ml/min (by C-G formula based on Cr of 1.06).    Assessment: 46yo male on coumadin PTA for h/o apical thrombus and recurrent PE/DVT started on heparin bridge therapy this morning in preparation for cardiac cath on Monday 12/22/11.  His heparil level is 0.11 after drip begun at 1700 unit/hr with no bolus.  His am INR was 1.49.  HL is below goal range. No bleeding reported.   Goal of Therapy:  Heparin level 0.3-0.7 units/ml Monitor platelets by anticoagulation protocol: Yes   Plan:  1. 3000 unit heparin bolus 2. Increase drip to 2050 units/hr 3. Check heparin level in 6 hrs Herby Abraham, Pharm.D. [phone] 12/20/2011 3:05 PM

## 2011-12-21 DIAGNOSIS — L03119 Cellulitis of unspecified part of limb: Secondary | ICD-10-CM

## 2011-12-21 DIAGNOSIS — Z86718 Personal history of other venous thrombosis and embolism: Secondary | ICD-10-CM

## 2011-12-21 DIAGNOSIS — L02419 Cutaneous abscess of limb, unspecified: Secondary | ICD-10-CM

## 2011-12-21 LAB — PROTIME-INR: Prothrombin Time: 16.3 seconds — ABNORMAL HIGH (ref 11.6–15.2)

## 2011-12-21 LAB — CBC
HCT: 43.4 % (ref 39.0–52.0)
Platelets: 188 10*3/uL (ref 150–400)
RBC: 5.09 MIL/uL (ref 4.22–5.81)
RDW: 14 % (ref 11.5–15.5)
WBC: 9.9 10*3/uL (ref 4.0–10.5)

## 2011-12-21 LAB — BASIC METABOLIC PANEL
BUN: 16 mg/dL (ref 6–23)
CO2: 28 mEq/L (ref 19–32)
Chloride: 99 mEq/L (ref 96–112)
Creatinine, Ser: 0.99 mg/dL (ref 0.50–1.35)
GFR calc Af Amer: >90 mL/min (ref >90)
GFR calc Af Amer: >90 mL/min (ref >90)
GFR calc non Af Amer: >90 mL/min (ref >90)
Glucose, Bld: 97 mg/dL (ref 70–99)
Potassium: 3.5 mEq/L (ref 3.5–5.1)
Potassium: 3.4 mEq/L — ABNORMAL LOW (ref 3.5–5.1)

## 2011-12-21 LAB — HEPARIN LEVEL (UNFRACTIONATED): Heparin Unfractionated: 0.39 IU/mL (ref 0.30–0.70)

## 2011-12-21 MED ORDER — ASPIRIN 81 MG PO CHEW
324.0000 mg | CHEWABLE_TABLET | ORAL | Status: DC
  Filled 2011-12-21: qty 4

## 2011-12-21 MED ORDER — SODIUM CHLORIDE 0.9 % IJ SOLN: 3.0000 mL | INTRAMUSCULAR | Status: DC | PRN

## 2011-12-21 MED ORDER — SODIUM CHLORIDE 0.9 % IV SOLN: INTRAVENOUS | Status: DC

## 2011-12-21 MED ORDER — SODIUM CHLORIDE 0.9 % IJ SOLN: 3.0000 mL | Freq: Two times a day (BID) | INTRAMUSCULAR | Status: DC

## 2011-12-21 MED ORDER — SODIUM CHLORIDE 0.9 % IV SOLN: 250.0000 mL | INTRAVENOUS | Status: DC | PRN

## 2011-12-21 MED ORDER — SODIUM CHLORIDE 0.9 % IV SOLN
INTRAVENOUS | Status: DC
  Administered 2011-12-22 – 2011-12-23 (×2): via INTRAVENOUS

## 2011-12-21 MED ORDER — DIAZEPAM 5 MG PO TABS: 5.0000 mg | ORAL_TABLET | ORAL | Status: DC

## 2011-12-21 MED ORDER — DIAZEPAM 2 MG PO TABS: 2.0000 mg | ORAL_TABLET | ORAL | Status: DC

## 2011-12-21 MED ORDER — SODIUM CHLORIDE 0.9 % IJ SOLN
3.0000 mL | Freq: Two times a day (BID) | INTRAMUSCULAR | Status: DC
  Administered 2011-12-21: 3 mL via INTRAVENOUS

## 2011-12-21 MED ORDER — ASPIRIN 81 MG PO CHEW
324.0000 mg | CHEWABLE_TABLET | ORAL | Status: AC
  Administered 2011-12-22: 324 mg via ORAL

## 2011-12-21 NOTE — Progress Notes (Addendum)
ANTICOAGULATION CONSULT NOTE - follow up  Pharmacy Consult for heparin Indication: bridge therapy for cath Monday 12/9, h/o apical thrombus and recurrent PE/DVT  Allergies  Allergen Reactions  . Shrimp (Shellfish Allergy) Hives and Itching    Patient Measurements: Height: 5\' 8"  (172.7 cm) Weight: 318 lb 2 oz (144.3 kg) (scale A) IBW/kg (Calculated) : 68.4  Heparin Dosing Weight: 110kg  Vital Signs: Temp: 97.9 F (36.6 C) (12/08 0533) Temp src: Oral (12/08 0533) BP: 102/58 mmHg (12/08 1038) Pulse Rate: 71  (12/08 1038)  Labs:  Basename 12/21/11 0455 12/20/11 2228 12/20/11 1415 12/20/11 0435 12/19/11 0630  HGB 14.5 -- -- -- --  HCT 43.4 -- -- -- --  PLT 188 -- -- -- --  APTT -- -- -- -- --  LABPROT 16.3* -- -- 17.6* 21.9*  INR 1.34 -- -- 1.49 2.00*  HEPARINUNFRC 0.39 0.28* 0.11* -- --  CREATININE 1.02 -- -- -- 1.06  CKTOTAL -- -- -- -- --  CKMB -- -- -- -- --  TROPONINI -- -- -- -- --    Estimated Creatinine Clearance: 126.5 ml/min (by C-G formula based on Cr of 1.02).    Assessment: 46yo male on coumadin PTA for h/o apical thrombus and recurrent PE/DVTon heparin bridge therapy in preparation for cardiac cath on Monday 12/22/11.  His heparin level is 0.39 today on 2200 units/hr.  His am INR was 1.34.  HL therapeutic. CBC stable. No bleeding reported.   Goal of Therapy:  Heparin level 0.3-0.7 units/ml Monitor platelets by anticoagulation protocol: Yes   Plan:  1. Increase to 2300 units/hr  to ensure he stays therapeutic as coumadin continues to wear off 2. Daily HL and CBC 3. F/u to resume coumadin post cath 12/9 Herby Abraham, Pharm.D. [phone] 12/21/2011 12:12 PM

## 2011-12-21 NOTE — Progress Notes (Signed)
   TELEMETRY: Reviewed telemetry pt in NSR: Filed Vitals:   12/20/11 1300 12/20/11 1810 12/20/11 2132 12/21/11 0533  BP: 95/47 98/51 110/57 113/65  Pulse: 69 63 70 64  Temp: 98.2 F (36.8 C)  98.2 F (36.8 C) 97.9 F (36.6 C)  TempSrc: Oral  Oral Oral  Resp: 20  18 18  Height:      Weight:    144.3 kg (318 lb 2 oz)  SpO2: 97%  96% 96%    Intake/Output Summary (Last 24 hours) at 12/21/11 0912 Last data filed at 12/21/11 0534  Gross per 24 hour  Intake    600 ml  Output   3400 ml  Net  -2800 ml    SUBJECTIVE Feels well. Breathing better each day. No chest pain.  LABS: Basic Metabolic Panel:  Basename 12/21/11 0455 12/19/11 0630  NA 137 139  K 3.5 3.4*  CL 99 99  CO2 28 31  GLUCOSE 99 90  BUN 17 15  CREATININE 1.02 1.06  CALCIUM 9.2 8.7  MG -- --  PHOS -- --   Liver Function Tests: No results found for this basename: AST:2,ALT:2,ALKPHOS:2,BILITOT:2,PROT:2,ALBUMIN:2 in the last 72 hours No results found for this basename: LIPASE:2,AMYLASE:2 in the last 72 hours CBC:  Basename 12/21/11 0455  WBC 9.9  NEUTROABS --  HGB 14.5  HCT 43.4  MCV 85.3  PLT 188   Radiology/Studies:  Dg Chest 2 View  12/16/2011  *RADIOLOGY REPORT*  Clinical Data: Shortness of breath, cough, weakness.  CHEST - 2 VIEW  Comparison: 06/14/2009  Findings: Cardiomegaly with vascular congestion.  Slight interstitial prominence.  Cannot exclude early interstitial edema. Linear bibasilar densities, likely atelectasis.  No visible effusions.  No acute bony abnormality.  IMPRESSION: Cardiomegaly with vascular congestion.  Question early interstitial edema.  Bibasilar atelectasis.   Original Report Authenticated By: Kevin Dover, M.D.     PHYSICAL EXAM General: Well developed, obese, in no acute distress. Head: Normocephalic, atraumatic, sclera non-icteric, no xanthomas, nares are without discharge. Neck: Negative for carotid bruits. JVD not elevated. Lungs: decreased BS in bases. Breathing is  unlabored. Heart: RRR S1 S2 without murmurs, rubs, or gallops.  Abdomen: Soft, non-tender, non-distended with normoactive bowel sounds. No hepatomegaly. No rebound/guarding. No obvious abdominal masses. Msk:  Strength and tone appears normal for age. Extremities: 2+ edema.  Distal pedal pulses are 2+ and equal bilaterally. Neuro: Alert and oriented X 3. Moves all extremities spontaneously. Psych:  Responds to questions appropriately with a normal affect.  ASSESSMENT AND PLAN: 1. Acute on chronic CHF with nonischemic CM. EF worse by Echo. Diuresing well. Weight decreasing. I/O negative 2400 ml. Continue IV diuresis. Results of cardiac MRI done this past week cannot be accessed in system. Will need to track down tomorrow. For R and L heart cath in am. INR down.  2. 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 3. PSVT 4. History of apical thrombus. 5. Morbid obesity. 6. Hypokalemia improved. 7. LLE cellulitis. On antibiotics.   Principal Problem:  *Acute on chronic combined systolic and diastolic heart failure Active Problems:  OBESITY, MORBID  Nonischemic cardiomyopathy  Paroxysmal SVT (supraventricular tachycardia)  History of venous thromboembolism  Cellulitis of left lower extremity    Signed, Swade Shonka MD,FACC 12/21/2011 9:19 AM    

## 2011-12-22 ENCOUNTER — Encounter (HOSPITAL_COMMUNITY)
Admission: EM | Disposition: A | Discharge status: Home / Self Care | Payer: Self-pay | Source: Home / Self Care | Attending: Cardiology

## 2011-12-22 DIAGNOSIS — R760 Raised antibody titer: Secondary | ICD-10-CM

## 2011-12-22 DIAGNOSIS — I509 Heart failure, unspecified: Secondary | ICD-10-CM

## 2011-12-22 HISTORY — PX: CARDIAC CATHETERIZATION: SHX172

## 2011-12-22 HISTORY — PX: LEFT AND RIGHT HEART CATHETERIZATION WITH CORONARY ANGIOGRAM: SHX5449

## 2011-12-22 LAB — POCT I-STAT 3, VENOUS BLOOD GAS (G3P V)
O2 Saturation: 59 %
pCO2, Ven: 50.2 mmHg — ABNORMAL HIGH (ref 45.0–50.0)
pO2, Ven: 32 mmHg (ref 30.0–45.0)

## 2011-12-22 LAB — CBC
HCT: 45.3 % (ref 39.0–52.0)
HCT: 44.7 % (ref 39.0–52.0)
Hemoglobin: 15 g/dL (ref 13.0–17.0)
Hemoglobin: 14.8 g/dL (ref 13.0–17.0)
MCH: 28.1 pg (ref 26.0–34.0)
MCHC: 33.1 g/dL (ref 30.0–36.0)
MCV: 85 fL (ref 78.0–100.0)
RBC: 5.25 MIL/uL (ref 4.22–5.81)
RDW: 14.1 % (ref 11.5–15.5)
WBC: 7.7 10*3/uL (ref 4.0–10.5)

## 2011-12-22 LAB — PROTEIN ELECTROPHORESIS, SERUM
Alpha-2-Globulin: 12.7 % — ABNORMAL HIGH (ref 7.1–11.8)
Gamma Globulin: 20.5 % — ABNORMAL HIGH (ref 11.1–18.8)
M-Spike, %: 0.32 g/dL
Total Protein ELP: 6.8 g/dL (ref 6.0–8.3)

## 2011-12-22 LAB — CREATININE, SERUM
Creatinine, Ser: 0.89 mg/dL (ref 0.50–1.35)
GFR calc Af Amer: >90 mL/min (ref >90)
GFR calc non Af Amer: >90 mL/min (ref >90)

## 2011-12-22 LAB — BASIC METABOLIC PANEL
BUN: 15 mg/dL (ref 6–23)
CO2: 27 mEq/L (ref 19–32)
Calcium: 9.2 mg/dL (ref 8.4–10.5)
Chloride: 98 mEq/L (ref 96–112)
Creatinine, Ser: 0.9 mg/dL (ref 0.50–1.35)
GFR calc Af Amer: >90 mL/min (ref >90)

## 2011-12-22 LAB — HEPARIN LEVEL (UNFRACTIONATED): Heparin Unfractionated: 0.47 IU/mL (ref 0.30–0.70)

## 2011-12-22 SURGERY — LEFT AND RIGHT HEART CATHETERIZATION WITH CORONARY ANGIOGRAM
Anesthesia: LOCAL

## 2011-12-22 MED ORDER — FUROSEMIDE 80 MG PO TABS
80.0000 mg | ORAL_TABLET | Freq: Two times a day (BID) | ORAL | Status: DC
  Administered 2011-12-22 – 2011-12-24 (×4): 80 mg via ORAL
  Filled 2011-12-22 (×6): qty 1

## 2011-12-22 MED ORDER — WARFARIN SODIUM 7.5 MG PO TABS
13.5000 mg | ORAL_TABLET | Freq: Once | ORAL | Status: AC
  Administered 2011-12-22: 13.5 mg via ORAL
  Filled 2011-12-22: qty 1

## 2011-12-22 MED ORDER — ONDANSETRON HCL 4 MG/2ML IJ SOLN: 4.0000 mg | Freq: Four times a day (QID) | INTRAMUSCULAR | Status: DC | PRN

## 2011-12-22 MED ORDER — HEPARIN (PORCINE) IN NACL 2-0.9 UNIT/ML-% IJ SOLN
INTRAMUSCULAR | Status: AC
  Filled 2011-12-22: qty 1000

## 2011-12-22 MED ORDER — NITROGLYCERIN 0.2 MG/ML ON CALL CATH LAB
INTRAVENOUS | Status: AC
  Filled 2011-12-22: qty 1

## 2011-12-22 MED ORDER — MIDAZOLAM HCL 2 MG/2ML IJ SOLN
INTRAMUSCULAR | Status: AC
  Filled 2011-12-22: qty 2

## 2011-12-22 MED ORDER — ACETAMINOPHEN 325 MG PO TABS: 650.0000 mg | ORAL_TABLET | ORAL | Status: DC | PRN

## 2011-12-22 MED ORDER — POTASSIUM CHLORIDE CRYS ER 20 MEQ PO TBCR
40.0000 meq | EXTENDED_RELEASE_TABLET | Freq: Once | ORAL | Status: AC
  Administered 2011-12-22: 40 meq via ORAL
  Filled 2011-12-22: qty 2

## 2011-12-22 MED ORDER — LIDOCAINE HCL (PF) 1 % IJ SOLN
INTRAMUSCULAR | Status: AC
  Filled 2011-12-22: qty 30

## 2011-12-22 MED ORDER — SODIUM CHLORIDE 0.9 % IV SOLN
INTRAVENOUS | Status: AC
  Administered 2011-12-22: 11:00:00 via INTRAVENOUS

## 2011-12-22 MED ORDER — HEPARIN SODIUM (PORCINE) 5000 UNIT/ML IJ SOLN
5000.0000 [IU] | Freq: Three times a day (TID) | INTRAMUSCULAR | Status: DC
  Administered 2011-12-22 – 2011-12-23 (×3): 5000 [IU] via SUBCUTANEOUS
  Filled 2011-12-22 (×6): qty 1

## 2011-12-22 MED ORDER — FENTANYL CITRATE 0.05 MG/ML IJ SOLN
INTRAMUSCULAR | Status: AC
  Filled 2011-12-22: qty 2

## 2011-12-22 NOTE — Progress Notes (Signed)
Patient Name: Roberto [name] Date of Encounter: 12/22/2011   Principal Problem:  *Acute on chronic combined systolic and diastolic heart failure Active Problems:  Nonischemic cardiomyopathy  Paroxysmal SVT (supraventricular tachycardia)  History of venous thromboembolism  OBESITY, MORBID  Cellulitis of left lower extremity  Lupus anticoagulant positive    SUBJECTIVE  No chest pain or sob.  Breathing improved.  CURRENT MEDS    . aspirin  324 mg Oral Pre-Cath  . aspirin  324 mg Oral Pre-Cath  . carvedilol  12.5 mg Oral BID WC  . cephALEXin  500 mg Oral Q6H  . diazepam  2 mg Oral On Call  . diazepam  5 mg Oral On Call  . digoxin  0.125 mg Oral Daily  . furosemide  80 mg Intravenous BID  . losartan  25 mg Oral Daily  . potassium chloride  10 mEq Oral BID  . sodium chloride  3 mL Intravenous Q12H  . spironolactone  12.5 mg Oral Daily  . Warfarin - Pharmacist Dosing Inpatient   Does not apply q1800  . [DISCONTINUED] sodium chloride  3 mL Intravenous Q12H  . [DISCONTINUED] sodium chloride  3 mL Intravenous Q12H    OBJECTIVE  Filed Vitals:   12/21/11 1330 12/21/11 1723 12/21/11 2037 12/22/11 0557  BP: 90/41 100/60 94/53 110/59  Pulse: 71 74 60 64  Temp: 98.6 F (37 C)  98.2 F (36.8 C) 98.1 F (36.7 C)  TempSrc: Oral  Oral Oral  Resp: 20  20 20   Height:      Weight:    316 lb 4.8 oz (143.473 kg)  SpO2: 98%  98% 95%    Intake/Output Summary (Last 24 hours) at 12/22/11 0643 Last data filed at 12/21/11 1700  Gross per 24 hour  Intake    483 ml  Output   1700 ml  Net  -1217 ml   Filed Weights   12/20/11 0537 12/21/11 0533 12/22/11 0557  Weight: 319 lb 0.1 oz (144.7 kg) 318 lb 2 oz (144.3 kg) 316 lb 4.8 oz (143.473 kg)   PHYSICAL EXAM  General: Pleasant, NAD. Neuro: Alert and oriented X 3. Moves all extremities spontaneously. Psych: Normal affect. HEENT:  Normal  Neck: Supple without bruits.  Obese - difficult to assess JVP. Lungs:  Resp regular and  unlabored, CTA. Heart: RRR no s3, s4, or murmurs. Abdomen: Soft, non-tender, non-distended, BS + x 4.  Extremities: No clubbing, cyanosis.  Trace bilat LE edema. DP/PT/Radials 2+ and equal bilaterally.  Accessory Clinical Findings  CBC  Basename 12/21/11 0455  WBC 9.9  NEUTROABS --  HGB 14.5  HCT 43.4  MCV 85.3  PLT 188   Basic Metabolic Panel  Basename 12/21/11 1921 12/21/11 0455  NA 137 137  K 3.4* 3.5  CL 97 99  CO2 28 28  GLUCOSE 97 99  BUN 16 17  CREATININE 0.99 1.02  CALCIUM 9.4 9.2  MG -- --  PHOS -- --   Lab Results  Component Value Date   INR 1.34 12/21/2011   INR 1.49 12/20/2011   INR 2.00* 12/19/2011    TELE  RSR, no svt.  Radiology/Studies  Dg Chest 2 View  12/16/2011  *RADIOLOGY REPORT*  Clinical Data: Shortness of breath, cough, weakness.  CHEST - 2 VIEW  Comparison: 06/14/2009  Findings: Cardiomegaly with vascular congestion.  Slight interstitial prominence.  Cannot exclude early interstitial edema. Linear bibasilar densities, likely atelectasis.  No visible effusions.  No acute bony abnormality.  IMPRESSION: Cardiomegaly  with vascular congestion.  Question early interstitial edema.  Bibasilar atelectasis.   Original Report Authenticated By: Charlett Nose, M.D.     ASSESSMENT AND PLAN  1.  Acute on chronic combined syst/diast CHF/NICM:  Feeling much better.  Weight continues to trend down - currently @ 316.  The lowest weight we have on record for him appears to be 313 lbs in 10/2008.  He is scheduled for R & L heart cath today given new reduction in EF.  Await R heart pressures but suspect that we will be able to transition to oral lasix.  He had previously been on lasix 40mg  po daily - will likely need bid dosing.  Cont bb, arb, dig, spiro.  2.  PSVT:  Maintaining sinus.  Cont bb/digoxin.  Prev on verapamil - d/c'd in setting of LV dysfxn.  ? If CM 2/2 tachycardia.  3.  H/O DVT/PE/Apical Thrombus - + Lupus Anticoagulant:  Coumadin on hold in prep for  cath.  Bridging with heparin.  Lupus anticoagulant was drawn 12/5 - he was not yet on heparin @ that point.  Will need to be bridged post-cath as well.  He has used lovenox in the past.  Will ask CM to look into this.  4.  LLE Cellulitis:  Legs look good.  Today is his seventh day of keflex therapy.  D/C tomorrow.  5.  Acute hypokalemia:  In setting of IV diuresis.  Await this AM's bmet.  He was 3.4 last night and received a scheduled dose of 10 meq.  Will give an additional 40 meq this AM.  Signed, Nicolasa Ducking NP  Patient transferred to cath lab prior to being seen by me; seen by DB. Olga Millers

## 2011-12-22 NOTE — CV Procedure (Signed)
Cardiac Cath Procedure Note  Indication: HF  Procedures performed:  1) Right heart cathererization 2) Selective coronary angiography 3) Left heart catheterization 4) Left ventriculogram  Description of procedure:     The risks and indication of the procedure were explained. Consent was signed and placed on the chart. An appropriate timeout was taken prior to the procedure. The right groin was prepped and draped in the routine sterile fashion and anesthetized with 1% local lidocaine.   A 5 FR long arterial sheath was placed in the right femoral artery using a modified Seldinger technique. Standard catheters including a JL4, JR4 and angled pigtail were used. All catheter exchanges were made over a wire. A 7 FR venous sheath was placed in the right femoral vein using a modified Seldinger technique. A standard Swan-Ganz catheter was used for the procedure.   Complications:  None apparent  Findings:  RA = 5 RV = 37/1/5 PA = 37/12 (23) PCW = 16 Fick cardiac output/index = 6.0/2.4 PVR = 1.2 Woods FA sat = 88% PA sat = 59%, 61%  Ao Pressure: 94/62 (77) LV Pressure: 99/2/12 There was no signficant gradient across the aortic valve on pullback.  Left main: Normal  LAD: 2 diagonals. Normal  LCX: Tiny OM-1 and OM-2. Large OM-3 and OM-4. Normal.   RCA: Dominant. Small to moderate-sized PDA. Normal  LV-gram done in the RAO projection:  Not done  Assessment: 1. Normal coronaries 2. Well compensated hemodynamics 3. Severe LV dysfunction by echo  Plan/Discussion:  Likely can go home today on current regimen. Follow-up in HF clinic next week. Also will need f/u with Dr. Johney Frame for SVT. Resume coumadin as outpatient.   Arvilla Meres, MD 8:37 AM

## 2011-12-22 NOTE — Progress Notes (Signed)
ANTICOAGULATION CONSULT NOTE - follow up  Pharmacy Consult for heparin, warfarin Indication: bridge therapy for cath Monday 12/9, h/o apical thrombus and recurrent PE/DVT  Allergies  Allergen Reactions  . Shrimp (Shellfish Allergy) Hives and Itching    Patient Measurements: Height: 5\' 8"  (172.7 cm) Weight: 316 lb 4.8 oz (143.473 kg) (scale a) IBW/kg (Calculated) : 68.4  Heparin Dosing Weight: 110kg  Vital Signs: Temp: 98.1 F (36.7 C) (12/09 0557) Temp src: Oral (12/09 0557) BP: 101/70 mmHg (12/09 1008) Pulse Rate: 76  (12/09 1008)  Labs:  Basename 12/22/11 0710 12/21/11 1921 12/21/11 0455 12/20/11 2228 12/20/11 0435  HGB 15.0 -- 14.5 -- --  HCT 45.3 -- 43.4 -- --  PLT 203 -- 188 -- --  APTT -- -- -- -- --  LABPROT 15.0 -- 16.3* -- 17.6*  INR 1.20 -- 1.34 -- 1.49  HEPARINUNFRC 0.47 -- 0.39 0.28* --  CREATININE 0.90 0.99 1.02 -- --  CKTOTAL -- -- -- -- --  CKMB -- -- -- -- --  TROPONINI -- -- -- -- --    Estimated Creatinine Clearance: 142.7 ml/min (by C-G formula based on Cr of 0.9).    Assessment: 46yo male on coumadin PTA for h/o apical thrombus (2011, last ECHO 12/18/11 showed no evidence of thrombus) and recurrent PE/DVT now s/p cardiac cath, on heparin over the weekend, now off.  Plan to resume home warfarin and discharge this afternoon.   His am INR was 1.20. CBC stable. No bleeding reported.   Goal of Therapy:  INR 2-3 Monitor platelets by anticoagulation protocol: Yes   Plan:  1. Plan to resume warfarin as an outpatient. 2. Will order 13.5mg  today in case discharge delayed.  Thank you for allowing pharmacy to be a part of this patients care team.  Lovenia Kim Pharm.D., BCPS Clinical Pharmacist 12/22/2011 10:16 AM Pager: [phone] Phone: [phone]

## 2011-12-22 NOTE — Care Management Note (Signed)
    Page 1 of 1   12/22/2011     11:30:17 AM   CARE MANAGEMENT NOTE 12/22/2011  Patient:  Roberto [name], Roberto [name]   Account Number:  [id number]  Date Initiated:  12/17/2011  Documentation initiated by:  GRAVES-BIGELOW,Miyako Oelke  Subjective/Objective Assessment:   Pt admitted with sob. IV lasix initiated.     Action/Plan:   CM will f/u for disposition needs.   Anticipated DC Date:  12/18/2011   Anticipated DC Plan:  HOME W HOME HEALTH SERVICES      DC Planning Services  CM consult  Medication Assistance      Choice offered to / List presented to:             Status of service:  Completed, signed off Medicare Important Message given?   (If response is "NO", the following Medicare IM given date fields will be blank) Date Medicare IM given:   Date Additional Medicare IM given:    Discharge Disposition:  HOME/SELF CARE  Per UR Regulation:  Reviewed for med. necessity/level of care/duration of stay  If discussed at Long Length of Stay Meetings, dates discussed:    Comments:  12-22-11 [address] [place], [place] [phone] CM did speak to pt last week and pt will not need a HHRN for services. CM did speak to pharmacist and pt will need home lovenox dosage 140mg  bid. CM will do a benefits check for lovenox. CM will make pt aware once completed. Pt has given self injections before.

## 2011-12-22 NOTE — H&P (View-Only) (Signed)
   TELEMETRY: Reviewed telemetry pt in NSR: Filed Vitals:   12/20/11 1300 12/20/11 1810 12/20/11 2132 12/21/11 0533  BP: 95/47 98/51 110/57 113/65  Pulse: 69 63 70 64  Temp: 98.2 F (36.8 C)  98.2 F (36.8 C) 97.9 F (36.6 C)  TempSrc: Oral  Oral Oral  Resp: 20  18 18   Height:      Weight:    144.3 kg (318 lb 2 oz)  SpO2: 97%  96% 96%    Intake/Output Summary (Last 24 hours) at 12/21/11 0912 Last data filed at 12/21/11 0534  Gross per 24 hour  Intake    600 ml  Output   3400 ml  Net  -2800 ml    SUBJECTIVE Feels well. Breathing better each day. No chest pain.  LABS: Basic Metabolic Panel:  Basename 12/21/11 0455 12/19/11 0630  NA 137 139  K 3.5 3.4*  CL 99 99  CO2 28 31  GLUCOSE 99 90  BUN 17 15  CREATININE 1.02 1.06  CALCIUM 9.2 8.7  MG -- --  PHOS -- --   Liver Function Tests: No results found for this basename: AST:2,ALT:2,ALKPHOS:2,BILITOT:2,PROT:2,ALBUMIN:2 in the last 72 hours No results found for this basename: LIPASE:2,AMYLASE:2 in the last 72 hours CBC:  Basename 12/21/11 0455  WBC 9.9  NEUTROABS --  HGB 14.5  HCT 43.4  MCV 85.3  PLT 188   Radiology/Studies:  Dg Chest 2 View  12/16/2011  *RADIOLOGY REPORT*  Clinical Data: Shortness of breath, cough, weakness.  CHEST - 2 VIEW  Comparison: 06/14/2009  Findings: Cardiomegaly with vascular congestion.  Slight interstitial prominence.  Cannot exclude early interstitial edema. Linear bibasilar densities, likely atelectasis.  No visible effusions.  No acute bony abnormality.  IMPRESSION: Cardiomegaly with vascular congestion.  Question early interstitial edema.  Bibasilar atelectasis.   Original Report Authenticated By: Charlett Nose, M.D.     PHYSICAL EXAM General: Well developed, obese, in no acute distress. Head: Normocephalic, atraumatic, sclera non-icteric, no xanthomas, nares are without discharge. Neck: Negative for carotid bruits. JVD not elevated. Lungs: decreased BS in bases. Breathing is  unlabored. Heart: RRR S1 S2 without murmurs, rubs, or gallops.  Abdomen: Soft, non-tender, non-distended with normoactive bowel sounds. No hepatomegaly. No rebound/guarding. No obvious abdominal masses. Msk:  Strength and tone appears normal for age. Extremities: 2+ edema.  Distal pedal pulses are 2+ and equal bilaterally. Neuro: Alert and oriented X 3. Moves all extremities spontaneously. Psych:  Responds to questions appropriately with a normal affect.  ASSESSMENT AND PLAN: 1. Acute on chronic CHF with nonischemic CM. EF worse by Echo. Diuresing well. Weight decreasing. I/O negative 2400 ml. Continue IV diuresis. Results of cardiac MRI done this past week cannot be accessed in system. Will need to track down tomorrow. For R and L heart cath in am. INR down.  2. 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 3. PSVT 4. History of apical thrombus. 5. Morbid obesity. 6. Hypokalemia improved. 7. LLE cellulitis. On antibiotics.   Principal Problem:  *Acute on chronic combined systolic and diastolic heart failure Active Problems:  OBESITY, MORBID  Nonischemic cardiomyopathy  Paroxysmal SVT (supraventricular tachycardia)  History of venous thromboembolism  Cellulitis of left lower extremity    Signed, Tanza Pellot [place] MD,FACC 12/21/2011 9:19 AM

## 2011-12-22 NOTE — Interval H&P Note (Signed)
History and Physical Interval Note:  12/22/2011 8:05 AM  Roberto [name]  has presented today for surgery, with the diagnosis of CHF  The various methods of treatment have been discussed with the patient and family. After consideration of risks, benefits and other options for treatment, the patient has consented to  Procedure(s) (LRB) with comments: LEFT AND RIGHT HEART CATHETERIZATION WITH CORONARY ANGIOGRAM (N/A) as a surgical intervention .  [name] history has been reviewed, patient examined, no change in status, stable for surgery.  I have reviewed the patient's chart and labs.  Questions were answered to the patient's satisfaction.     Daniel Bensimhon

## 2011-12-22 NOTE — Progress Notes (Signed)
[id number] Pt on bedrest. Ex education completed with pt. Pt can walk independently if not d/ced. Karema Tocci DunlapRN

## 2011-12-23 LAB — BASIC METABOLIC PANEL
CO2: 28 mEq/L (ref 19–32)
Calcium: 9.2 mg/dL (ref 8.4–10.5)
GFR calc non Af Amer: >90 mL/min (ref >90)
Glucose, Bld: 93 mg/dL (ref 70–99)
Potassium: 4 mEq/L (ref 3.5–5.1)
Sodium: 137 mEq/L (ref 135–145)

## 2011-12-23 LAB — POCT I-STAT 3, VENOUS BLOOD GAS (G3P V)
Bicarbonate: 29 mEq/L — ABNORMAL HIGH (ref 20.0–24.0)
O2 Saturation: 61 %
TCO2: 30 mmol/L (ref 0–100)
pCO2, Ven: 48.7 mmHg (ref 45.0–50.0)
pH, Ven: 7.383 — ABNORMAL HIGH (ref 7.250–7.300)

## 2011-12-23 LAB — POCT I-STAT 3, ART BLOOD GAS (G3+)
O2 Saturation: 88 %
pCO2 arterial: 44.7 mmHg (ref 35.0–45.0)
pH, Arterial: 7.385 (ref 7.350–7.450)
pO2, Arterial: 56 mmHg — ABNORMAL LOW (ref 80.0–100.0)

## 2011-12-23 LAB — CBC
Hemoglobin: 14.7 g/dL (ref 13.0–17.0)
MCH: 28.3 pg (ref 26.0–34.0)
MCHC: 32.8 g/dL (ref 30.0–36.0)
Platelets: 184 10*3/uL (ref 150–400)
RBC: 5.2 MIL/uL (ref 4.22–5.81)

## 2011-12-23 LAB — PROTIME-INR
INR: 1.15 (ref 0.00–1.49)
Prothrombin Time: 14.5 seconds (ref 11.6–15.2)

## 2011-12-23 MED ORDER — POTASSIUM CHLORIDE CRYS ER 20 MEQ PO TBCR
20.0000 meq | EXTENDED_RELEASE_TABLET | Freq: Two times a day (BID) | ORAL | Status: DC
  Administered 2011-12-23 – 2011-12-24 (×2): 20 meq via ORAL
  Filled 2011-12-23 (×2): qty 1

## 2011-12-23 MED ORDER — WARFARIN SODIUM 2.5 MG PO TABS
12.5000 mg | ORAL_TABLET | Freq: Once | ORAL | Status: AC
  Administered 2011-12-23: 12.5 mg via ORAL
  Filled 2011-12-23: qty 1

## 2011-12-23 MED ORDER — ENOXAPARIN SODIUM 150 MG/ML ~~LOC~~ SOLN
140.0000 mg | Freq: Two times a day (BID) | SUBCUTANEOUS | Status: DC
  Administered 2011-12-23 – 2011-12-24 (×3): 140 mg via SUBCUTANEOUS
  Filled 2011-12-23 (×4): qty 1

## 2011-12-23 NOTE — Progress Notes (Signed)
Patient had a 3 beat run of V-Tach. Patient was asymptomatic at the time. No complaints and no signs or symptoms of distress or discomfort. BP is 99/46 and HR is 60. PA notified. No new orders given. Will continue to monitor patient for further changes in condition.

## 2011-12-23 NOTE — Progress Notes (Signed)
Patient Name: Roberto [name] Date of Encounter: 12/23/2011   Principal Problem:  *Acute on chronic combined systolic and diastolic heart failure Active Problems:  OBESITY, MORBID  Nonischemic cardiomyopathy  Paroxysmal SVT (supraventricular tachycardia)  History of venous thromboembolism  Cellulitis of left lower extremity  Lupus anticoagulant positive    SUBJECTIVE  No chest pain or sob.    CURRENT MEDS    . carvedilol  12.5 mg Oral BID WC  . cephALEXin  500 mg Oral Q6H  . digoxin  0.125 mg Oral Daily  . furosemide  80 mg Oral BID  . heparin  5,000 Units Subcutaneous Q8H  . losartan  25 mg Oral Daily  . potassium chloride  10 mEq Oral BID  . [COMPLETED] potassium chloride  40 mEq Oral Once  . spironolactone  12.5 mg Oral Daily  . [COMPLETED] warfarin  13.5 mg Oral ONCE-1800  . Warfarin - Pharmacist Dosing Inpatient   Does not apply q1800  . [DISCONTINUED] aspirin  324 mg Oral Pre-Cath  . [DISCONTINUED] diazepam  2 mg Oral On Call  . [DISCONTINUED] diazepam  5 mg Oral On Call  . [DISCONTINUED] furosemide  80 mg Intravenous BID  . [DISCONTINUED] sodium chloride  3 mL Intravenous Q12H    OBJECTIVE  Filed Vitals:   12/23/11 0524 12/23/11 0614 12/23/11 0901 12/23/11 0906  BP: 110/47 100/60  106/56  Pulse: 73 74 68 68  Temp: 97.9 F (36.6 C)     TempSrc: Oral     Resp: 19     Height:      Weight: 317 lb 8 oz (144.017 kg)     SpO2: 96%       Intake/Output Summary (Last 24 hours) at 12/23/11 1003 Last data filed at 12/23/11 0828  Gross per 24 hour  Intake 1457.5 ml  Output    625 ml  Net  832.5 ml   Filed Weights   12/21/11 0533 12/22/11 0557 12/23/11 0524  Weight: 318 lb 2 oz (144.3 kg) 316 lb 4.8 oz (143.473 kg) 317 lb 8 oz (144.017 kg)   PHYSICAL EXAM  General: Pleasant, NAD; obese Neuro: Alert and oriented X 3. Moves all extremities spontaneously. HEENT:  Normal  Neck: Supple  Lungs:  Resp regular and unlabored, CTA. Heart: RRR no s3, s4, or  murmurs. Abdomen: Soft, non-tender, non-distended Extremities: No edema; right groin with no hematoma and no bruit  Accessory Clinical Findings  CBC  Basename 12/23/11 0528 12/22/11 1108  WBC 9.6 7.7  NEUTROABS -- --  HGB 14.7 14.8  HCT 44.8 44.7  MCV 86.2 85.1  PLT 184 176   Basic Metabolic Panel  Basename 12/23/11 0528 12/22/11 1108 12/22/11 0710  NA 137 -- 137  K 4.0 -- 3.3*  CL 100 -- 98  CO2 28 -- 27  GLUCOSE 93 -- 94  BUN 14 -- 15  CREATININE 0.97 0.89 --  CALCIUM 9.2 -- 9.2  MG -- -- --  PHOS -- -- --   Lab Results  Component Value Date   INR 1.15 12/23/2011   INR 1.20 12/22/2011   INR 1.34 12/21/2011      Radiology/Studies  Dg Chest 2 View  12/16/2011  *RADIOLOGY REPORT*  Clinical Data: Shortness of breath, cough, weakness.  CHEST - 2 VIEW  Comparison: 06/14/2009  Findings: Cardiomegaly with vascular congestion.  Slight interstitial prominence.  Cannot exclude early interstitial edema. Linear bibasilar densities, likely atelectasis.  No visible effusions.  No acute bony abnormality.  IMPRESSION:  Cardiomegaly with vascular congestion.  Question early interstitial edema.  Bibasilar atelectasis.   Original Report Authenticated By: Charlett Nose, M.D.     ASSESSMENT AND PLAN  1.  Acute on chronic combined syst/diast CHF/NICM:  Feeling much better.  Cardiac MRI shows EF 38. Cath results noted.  Cont bb, arb, dig, spiro. Continue lasix 80 mg po BID. Plan FU in CHF clinic; once meds fully titrated, repeat echo; if EF <35, consider ICD. Check BMET one week after DC.  2.  PSVT:  Maintaining sinus.  Cont bb/digoxin. ? If CM 2/2 tachycardia. FU Dr Johney Frame following DC for consideration of ablation.  3.  H/O DVT/PE/Apical Thrombus - + Lupus Anticoagulant:  Coumadin resumed; Begin lovenox until INR therapeutic (home lovenox being assessed). Patient follows with his primary care for INR checks; lovenox needs to be DCed when INR is 2 or greater.  4.  LLE Cellulitis:  DC  keflex  Gabryelle Whitmoyer 10:11 AM

## 2011-12-23 NOTE — Progress Notes (Signed)
Patient's BP is 99/46 and HR is 60. Patient is asymptomatic. PA notified. New orders are to hold Coreg. Will continue to monitor patient for further changes in condition.

## 2011-12-23 NOTE — Progress Notes (Signed)
ANTICOAGULATION CONSULT NOTE - Follow Up Consult  Pharmacy Consult for Lovenox and Coumadin Indication: h/o apical thrombus and recurrent PE/DVT; Lupus anticoagulant positive  Allergies  Allergen Reactions  . Shrimp (Shellfish Allergy) Hives and Itching    Patient Measurements: Height: 5\' 8"  (172.7 cm) Weight: 317 lb 8 oz (144.017 kg) IBW/kg (Calculated) : 68.4  Heparin Dosing Weight:   Vital Signs: Temp: 97.9 F (36.6 C) (12/10 0524) Temp src: Oral (12/10 0524) BP: 106/56 mmHg (12/10 0906) Pulse Rate: 68  (12/10 0906)  Labs:  Basename 12/23/11 0528 12/22/11 1108 12/22/11 0710 12/21/11 0455 12/20/11 2228  HGB 14.7 14.8 -- -- --  HCT 44.8 44.7 45.3 -- --  PLT 184 176 203 -- --  APTT -- -- -- -- --  LABPROT 14.5 -- 15.0 16.3* --  INR 1.15 -- 1.20 1.34 --  HEPARINUNFRC -- -- 0.47 0.39 0.28*  CREATININE 0.97 0.89 0.90 -- --  CKTOTAL -- -- -- -- --  CKMB -- -- -- -- --  TROPONINI -- -- -- -- --    Estimated Creatinine Clearance: 132.7 ml/min (by C-G formula based on Cr of 0.97).  Assessment: 46yom to begin Lovenox bridging to Coumadin for hx apical thrombus (2011, last ECHO 12/18/11 showed no evidence of thrombus) and recurrent PE/DVT. Patient has been receiving SQ heparin s/p cath (12/9) until this AM (last dose 0600). Pharmacy consulted to manage therapeutic Lovenox until INR > 2.  INR (1.15) is subtherapeutic as expected following Coumadin restart s/p cath. Patient's goal INR is 2.5-3 per PCP and PTA regimen is 9mg  daily.  - H/H and Plts wnl - No significant bleeding reported - Weight: 144kg - CrCl >100 ml/min  Goal of Therapy:  Anti-Xa level 0.6-1.2 units/ml 4hrs after LMWH dose given INR 2.5-3 per MD Monitor platelets by anticoagulation protocol: Yes   Plan:  1. Lovenox 140mg  (~1mg /kg) SQ q12h 2. Coumadin 12.5mg  po x 1 today 3. Daily INR and CBC q72hr  Cleon Dew [phone] 12/23/2011,10:58 AM

## 2011-12-24 ENCOUNTER — Encounter (HOSPITAL_COMMUNITY): Payer: Self-pay | Admitting: Physician Assistant

## 2011-12-24 DIAGNOSIS — I428 Other cardiomyopathies: Secondary | ICD-10-CM

## 2011-12-24 DIAGNOSIS — E876 Hypokalemia: Secondary | ICD-10-CM

## 2011-12-24 LAB — CBC
MCH: 28 pg (ref 26.0–34.0)
MCHC: 33 g/dL (ref 30.0–36.0)
MCV: 84.9 fL (ref 78.0–100.0)
Platelets: 185 10*3/uL (ref 150–400)
RBC: 5.22 MIL/uL (ref 4.22–5.81)

## 2011-12-24 LAB — PROTIME-INR: Prothrombin Time: 17.6 seconds — ABNORMAL HIGH (ref 11.6–15.2)

## 2011-12-24 MED ORDER — WARFARIN SODIUM 4 MG PO TABS: 4.0000 mg | ORAL_TABLET | Freq: Every day | ORAL | Status: DC

## 2011-12-24 MED ORDER — DIGOXIN 125 MCG PO TABS: 0.1250 mg | ORAL_TABLET | Freq: Every day | ORAL | Status: DC

## 2011-12-24 MED ORDER — ENOXAPARIN SODIUM 100 MG/ML ~~LOC~~ SOLN: 140.0000 mg | Freq: Two times a day (BID) | SUBCUTANEOUS | Status: DC

## 2011-12-24 MED ORDER — POTASSIUM CHLORIDE CRYS ER 20 MEQ PO TBCR: 20.0000 meq | EXTENDED_RELEASE_TABLET | Freq: Two times a day (BID) | ORAL | Status: DC

## 2011-12-24 MED ORDER — WARFARIN SODIUM 10 MG PO TABS
10.0000 mg | ORAL_TABLET | Freq: Once | ORAL | Status: DC
  Filled 2011-12-24: qty 1

## 2011-12-24 MED ORDER — SPIRONOLACTONE 12.5 MG HALF TABLET: 12.5000 mg | ORAL_TABLET | Freq: Every day | ORAL | Status: DC

## 2011-12-24 MED ORDER — WARFARIN SODIUM 5 MG PO TABS: ORAL_TABLET | ORAL | Status: DC

## 2011-12-24 MED ORDER — FUROSEMIDE 80 MG PO TABS: 80.0000 mg | ORAL_TABLET | Freq: Two times a day (BID) | ORAL | Status: DC

## 2011-12-24 NOTE — Progress Notes (Signed)
ANTICOAGULATION CONSULT NOTE - Follow Up Consult  Pharmacy Consult for Lovenox and Coumadin Indication: h/o apical thrombus and recurrent PE/DVT; Lupus anticoagulant positive   Allergies  Allergen Reactions  . Shrimp (Shellfish Allergy) Hives and Itching    Patient Measurements: Height: 5\' 8"  (172.7 cm) Weight: 314 lb 9.5 oz (142.7 kg) IBW/kg (Calculated) : 68.4  Heparin Dosing Weight:   Vital Signs: Temp: 98.2 F (36.8 C) (12/11 0601) Temp src: Oral (12/11 0601) BP: 104/64 mmHg (12/11 1019) Pulse Rate: 69  (12/11 1019)  Labs:  Basename 12/24/11 0550 12/23/11 0528 12/22/11 1108 12/22/11 0710  HGB 14.6 14.7 -- --  HCT 44.3 44.8 44.7 --  PLT 185 184 176 --  APTT -- -- -- --  LABPROT 17.6* 14.5 -- 15.0  INR 1.49 1.15 -- 1.20  HEPARINUNFRC -- -- -- 0.47  CREATININE -- 0.97 0.89 0.90  CKTOTAL -- -- -- --  CKMB -- -- -- --  TROPONINI -- -- -- --    Estimated Creatinine Clearance: 132 ml/min (by C-G formula based on Cr of 0.97).   Medications:  Lovenox 140mg  SQ q12h  Assessment: 46yom on Lovenox bridging to Coumadin for hx apical thrombus (2011, last ECHO 12/18/11 showed no evidence of thrombus) and recurrent PE/DVT. INR (1.49) is subtherapeutic but is trending up. Noted and discussed discharge plans with Cardiology - will order Coumadin 10mg  dose in case discharge delayed. - H/H and Plts stable - No significant bleeding reported - Weight: 142kg, CrCl >100 ml/min  Goal of Therapy:  Anti-Xa level 0.6-1.2 units/ml 4hrs after LMWH dose given  INR 2.5-3 per MD  Monitor platelets by anticoagulation protocol: Yes   Plan:  1. Discharge recommendations: Coumadin 10mg  today, then resume Coumadin PTA regimen (9mg  daily) with INR follow-up in 2-3 days 2. Continue Lovenox 140mg  SQ q12h until INR >2  Cleon Dew [phone] 12/24/2011,11:00 AM

## 2011-12-24 NOTE — Discharge Summary (Signed)
See progress notes Roberto [name]  

## 2011-12-24 NOTE — Discharge Summary (Signed)
Discharge Summary   Patient ID: Roberto [name],  MRN: [id number], DOB/AGE: 46-18-1967 46 y.o.  Admit date: 12/16/2011 Discharge date: 12/24/2011  Primary Physician: Carollee Herter, MD Primary Cardiologist: Olga Millers, MD  Discharge Diagnoses Principal Problem:  *Acute on chronic combined systolic and diastolic heart failure Active Problems:  OBESITY, MORBID  Nonischemic cardiomyopathy  Paroxysmal SVT (supraventricular tachycardia)  History of venous thromboembolism  Cellulitis of left lower extremity  Lupus anticoagulant positive  Hypokalemia   Allergies Allergies  Allergen Reactions  . Shrimp (Shellfish Allergy) Hives and Itching    Diagnostic Studies/Procedures  PA/LATERAL CHEST X-RAY - 12/16/11  IMPRESSION:  Cardiomegaly with vascular congestion. Question early interstitial edema. Bibasilar atelectasis.   LEFT LOWER EXTREMITY VENOUS DOPPLER - 12/16/11  - No evidence of acute deep vein thrombosis involving the left lower extremity. Chronic changes are noted in the left femoral and popliteal veins from previous thrombosis.  - No evidence of Baker's cyst on the left.  TRANSTHORACIC ECHOCARDIOGRAM - 12/17/11  - Left ventricle: The cavity size was moderately dilated. Wall thickness was normal. Systolic function was severely reduced. The estimated ejection fraction was in the range of 25% to 30%. Diffuse hypokinesis. Doppler parameters are consistent with a reversible restrictive pattern, indicative of decreased left ventricular diastolic compliance and/or increased left atrial pressure (grade 3 diastolic dysfunction). - Aortic valve: Trivial regurgitation. - Mitral valve: Moderate regurgitation. - Left atrium: The atrium was moderately dilated. - Right atrium: The atrium was mildly dilated.  CARDIAC MRI - 12/18/11  1. Moderately dilated left ventricle with moderately decreased systolic function, EF 38%. Global hypokinesis.  2. Mildly dilated right ventricle  with mild to moderately decreased systolic function.  3. No definite LV thrombus noted.  4. Patchy non-subendocardial delayed enhancement seen in the ventricular septum (see above for description). This is not suggestive of a coronary disease pattern. This could be suggestive of infiltrative disease versus prior myocarditis.  RIGHT/LEFT HEART CATHETERIZATION - 12/22/11  Findings:  RA = 5  RV = 37/1/5  PA = 37/12 (23)  PCW = 16  Fick cardiac output/index = 6.0/2.4  PVR = 1.2 Woods  FA sat = 88%  PA sat = 59%, 61%  Ao Pressure: 94/62 (77)  LV Pressure: 99/2/12  There was no signficant gradient across the aortic valve on pullback.  Left main: Normal  LAD: 2 diagonals. Normal  LCX: Tiny OM-1 and OM-2. Large OM-3 and OM-4. Normal.  RCA: Dominant. Small to moderate-sized PDA. Normal  LV-gram done in the RAO projection: Not done  Assessment:  1. Normal coronaries  2. Well compensated hemodynamics  3. Severe LV dysfunction by echo  History of Present Illness  Roberto [name] is a 46yo AA male admitted to [facility] hospital on 12/16/11 with the above problem list. His past medical history includes NICM (EF 45%, normal cath 06/2006), h/o recurrent DVT with resultant PE and chronic venous insufficiency, h/o LV thrombus (on Coumadin), PSVT (elected for medical therapy over RFA on EP follow-up) and morbid obesity. He reported worsening shortness of breath x 1 week with associated chest tightness and LE edema after performing circuit training, then experiencing a 15-minute episode of tachy-palpitations (consistent with past PSVT episodes), which was relieved by Valalva and verapamil PRN x 1. There was some dietary indiscretion endorsed. He thus presented to the ED. pBNP was elevated, CXR as above revealed pulmonary edema and he was found to be volume overloaded on exam. He was diagnosed with acute on chronic combined CHF. D-dimer  was WNL. There was no evidence of ischemia. He was admitted for diuresis and  further work-up.   Hospital Course   He was started on Keflex given concern of LLE (mild leukocytosis, fever and leg calor/erythema). LLE doppler revealed no evidence of DVT. He was started on IV Lasix and diuresed well with symptom improvement. Outpatient verapamil was held given suspected reduced EF. INR was mildly supratherapeutic on admission and Coumadin was dosed per pharmacy initially. He underwent TTE revealing LVEF 25-30%, grade 3 diastolic dysfunction (c/w restriction), mod MR, mod LA dilatation, mild RA dilatation, mod LV dilatation, trivial AI, ULN RV size, normal function.  Given these findings, and newly reduced EF, the heart failure service was consulted and arrangements were made to pursue a cardiac MRI to rule out infiltrative disease, evaluate for LV thrombus, LV noncompaction, and a right and left cardiac catheterization for hemodynamic and coronary assessment. Spironolactone and digoxin were added to ARB, BB and Lasix. Coumadin was held and heparin started in anticipation of cath.   There was also a question regarding family history of a clotting disorder, and given h/o LV thrombus with only moderate LV dysfunction (EF 45% at that time), a hypercoagulable panel was drawn revealing positive lupus anticoagulant.   Cardiac MRI as above revealed LVEF 38%, mod LV dilatation, global HK, mild RV dilatation, mild-mod RV systolic dysfunction, no LV thrombus and patchy non-subendocardial delayed enhancement c/w infiltrative disease or prior myocarditis.   His symptoms continued to improve as he diuresed. He ambulated well with cardiac rehab. There were no episodes of SVT. He did develop a mild diuretic-induced hypokalemia which was corrected with supplementation.   He was informed, consented and prepped for cardiac catheterization outlined above. This revealed normal coronaries, well-compensated hemodynamics and supported severe LV dysfunction. He tolerated the procedure well without  complications.   He was evaluated by Dr. [name] today and was deemed stable for discharge. He will resume Coumadin with Lovenox bridging. He was done this before. Case management has made arrangements. INR is checked by his PCP. He will follow-up and check PT/INR on 12/26/11 as noted below. He will follow-up in the heart failure clinic in <//= 7 days given CHF status. BMET will be drawn at that time. The recommendation has been made to repeat an echo once heart failure meds have been fully titrated. If < 35%, consideration of ICD implantation can be made at that time. Of note, the patient did opt for consideration of RFA on further discussion. He will follow-up with EP as noted below for RFCA consideration. He completed a course of Keflex. He will be discharged on the medications below. Total I/O: - 3023.5. Admission weight: 331 lbs. Discharge weight: 314 lbs. Differential diagnosis of NICM included tachy-mediated vs prior myocarditis vs infiltrative process. This will be followed as an outpatient. This information, including post-cath instructions and supplemental educational material, has been clearly outlined in the discharge AVS.   Discharge Vitals:  Blood pressure 104/64, pulse 69, temperature 98.2 F (36.8 C), temperature source Oral, resp. rate 20, height 5\' 8"  (1.727 m), weight 142.7 kg (314 lb 9.5 oz), SpO2 96.00%.   Labs: Recent Labs  [facility] 12/24/11 0550 12/23/11 0528   WBC 10.2 9.6   HGB 14.6 14.7   HCT 44.3 44.8   MCV 84.9 86.2   PLT 185 184   Lab 12/23/11 0528 12/22/11 1108 12/22/11 0710 12/21/11 1921  NA 137 -- 137 137  K 4.0 -- 3.3* 3.4*  CL 100 -- 98 97  CO2 28 -- 27 28  BUN 14 -- 15 16  CREATININE 0.97 0.89 0.90 --  CALCIUM 9.2 -- 9.2 9.4  PROT -- -- -- --  BILITOT -- -- -- --  ALKPHOS -- -- -- --  ALT -- -- -- --  AST -- -- -- --  AMYLASE -- -- -- --  LIPASE -- -- -- --  GLUCOSE 93 -- 94 97   Disposition:  Discharge Orders    Future Appointments: Provider:  Department: Dept Phone: Center:   12/26/2011 4:00 PM [name], MD [facility] [phone] [facility]   12/29/2011 10:45 AM [facility] [phone] None   02/09/2012 2:30 PM Hillis Range, MD [facility] Main Office [place]) [phone] [facility]     Future Orders Please Complete By Expires   Amb Referral to Cardiac Rehabilitation      Diet - low sodium heart healthy      Increase activity slowly        Follow-up Information    Follow up with Arvilla Meres, MD. On 12/29/2011. (10:45 AM)    Contact information:   [address] [place] [address] [phone] Garage Access Code: [id number]       Follow up with Hillis Range, MD. On 02/09/2012. (2:30 PM)    Contact information:   [address], [address] [place] [address] [phone]       Follow up with Carollee Herter, MD. On 12/26/2011. (3:45 PM for follow-up and Coumadin check. )    Contact information:   [address] [place] [address] [phone]         Discharge Medications:    Medication List     As of 12/24/2011 11:13 AM    START taking these medications         digoxin 0.125 MG tablet   Commonly known as: LANOXIN   Take 1 tablet (0.125 mg total) by mouth daily.      enoxaparin 100 MG/ML injection   Commonly known as: LOVENOX   Inject 1.4 mLs (140 mg total) into the skin every 12 (twelve) hours. Stop when INR >/= 2.0.      potassium chloride SA 20 MEQ tablet   Commonly known as: K-DUR,KLOR-CON   Take 1 tablet (20 mEq total) by mouth 2 (two) times daily.      spironolactone 12.5 mg Tabs   Commonly known as: ALDACTONE   Take 0.5 tablets (12.5 mg total) by mouth daily.      CHANGE how you take these medications         furosemide 80 MG tablet   Commonly known as: LASIX   Take 1 tablet (80 mg total) by mouth 2 (two) times daily.   What changed: - medication strength - dose -  how often to take the med      * warfarin 5 MG tablet   Commonly known as: COUMADIN   Take two tablets (10mg  total) on 12/24/11. Then take with coumadin 4mg  (9 mg total) on 12/25/11 until further instructions on Coumadin check 12/26/11.   What changed: - dose - route (how to take the med) - how often to take the med - doctor's instructions      * warfarin 4 MG tablet   Commonly known as: COUMADIN   Take 1 tablet (4 mg total) by mouth daily. Take with coumadin 5mg  on 12/25/11 (9 mg total).   Start taking on:  12/25/2011   What changed: doctor's instructions     * Notice: This list has 2 medication(s) that are the same as other medications prescribed for you. Read the directions carefully, and ask your doctor or other care provider to review them with you.    CONTINUE taking these medications         carvedilol 12.5 MG tablet   Commonly known as: COREG   Take 1 tablet (12.5 mg total) by mouth 2 (two) times daily with a meal.      losartan 25 MG tablet   Commonly known as: COZAAR   Take 1 tablet (25 mg total) by mouth daily.      nitroGLYCERIN 0.4 MG SL tablet   Commonly known as: NITROSTAT      STOP taking these medications         potassium chloride 10 MEQ tablet   Commonly known as: K-DUR      verapamil 80 MG tablet   Commonly known as: CALAN          Where to get your medications    These are the prescriptions that you need to pick up. We sent them to a specific pharmacy, so you will need to go there to get them.   [facility] [place], [place] - [address]    [address] [place] [address]    Phone: [phone]        digoxin 0.125 MG tablet   enoxaparin 100 MG/ML injection   furosemide 80 MG tablet   potassium chloride SA 20 MEQ tablet   spironolactone 12.5 mg Tabs         Information on where to get these meds is not yet available. Ask your nurse or doctor.         warfarin 4 MG tablet   warfarin 5 MG tablet            Outstanding Labs/Studies: PT/INR on 12/26/11, BMET on 12/29/11  Duration of Discharge Encounter: Greater than 30 minutes including physician time.  Signed, R. Hurman Horn, PA-C 12/24/2011, 11:13 AM

## 2011-12-24 NOTE — Progress Notes (Signed)
DC IV, DC Tele, DC Home. Discharge instructions and home medications discussed with patient. Patient denied any questions or concerns at this time. Patient leaving unit via wheelchair and appears in no acute distress.  

## 2011-12-24 NOTE — Progress Notes (Signed)
Patient Name: Roberto [name] Date of Encounter: 12/24/2011   Principal Problem:  *Acute on chronic combined systolic and diastolic heart failure Active Problems:  OBESITY, MORBID  Nonischemic cardiomyopathy  Paroxysmal SVT (supraventricular tachycardia)  History of venous thromboembolism  Cellulitis of left lower extremity  Lupus anticoagulant positive    SUBJECTIVE  No chest pain or sob.    CURRENT MEDS    . carvedilol  12.5 mg Oral BID WC  . digoxin  0.125 mg Oral Daily  . enoxaparin (LOVENOX) injection  140 mg Subcutaneous Q12H  . furosemide  80 mg Oral BID  . losartan  25 mg Oral Daily  . potassium chloride  20 mEq Oral BID  . spironolactone  12.5 mg Oral Daily  . [COMPLETED] warfarin  12.5 mg Oral ONCE-1800  . Warfarin - Pharmacist Dosing Inpatient   Does not apply q1800  . [DISCONTINUED] cephALEXin  500 mg Oral Q6H  . [DISCONTINUED] heparin  5,000 Units Subcutaneous Q8H  . [DISCONTINUED] potassium chloride  10 mEq Oral BID    OBJECTIVE  Filed Vitals:   12/23/11 1338 12/23/11 1702 12/23/11 2147 12/24/11 0601  BP: 93/49 99/46 103/66 102/51  Pulse: 64 60 64 70  Temp: 98.2 F (36.8 C)  97.4 F (36.3 C) 98.2 F (36.8 C)  TempSrc: Oral  Oral Oral  Resp: 18  18 20   Height:      Weight:    314 lb 9.5 oz (142.7 kg)  SpO2: 97%  96% 96%    Intake/Output Summary (Last 24 hours) at 12/24/11 0722 Last data filed at 12/23/11 1755  Gross per 24 hour  Intake    700 ml  Output   1000 ml  Net   -300 ml   Filed Weights   12/22/11 0557 12/23/11 0524 12/24/11 0601  Weight: 316 lb 4.8 oz (143.473 kg) 317 lb 8 oz (144.017 kg) 314 lb 9.5 oz (142.7 kg)   PHYSICAL EXAM  General: Pleasant, NAD; obese Neuro: Alert and oriented X 3. Moves all extremities spontaneously. HEENT:  Normal  Neck: Supple  Lungs:  Resp regular and unlabored, CTA. Heart: RRR no s3, s4, or murmurs. Abdomen: Soft, non-tender, non-distended Extremities: No edema; right groin with no  hematoma  Accessory Clinical Findings  CBC  Basename 12/24/11 0550 12/23/11 0528  WBC 10.2 9.6  NEUTROABS -- --  HGB 14.6 14.7  HCT 44.3 44.8  MCV 84.9 86.2  PLT 185 184   Basic Metabolic Panel  Basename 12/23/11 0528 12/22/11 1108 12/22/11 0710  NA 137 -- 137  K 4.0 -- 3.3*  CL 100 -- 98  CO2 28 -- 27  GLUCOSE 93 -- 94  BUN 14 -- 15  CREATININE 0.97 0.89 --  CALCIUM 9.2 -- 9.2  MG -- -- --  PHOS -- -- --   Lab Results  Component Value Date   INR 1.49 12/24/2011   INR 1.15 12/23/2011   INR 1.20 12/22/2011      Radiology/Studies  Dg Chest 2 View  12/16/2011  *RADIOLOGY REPORT*  Clinical Data: Shortness of breath, cough, weakness.  CHEST - 2 VIEW  Comparison: 06/14/2009  Findings: Cardiomegaly with vascular congestion.  Slight interstitial prominence.  Cannot exclude early interstitial edema. Linear bibasilar densities, likely atelectasis.  No visible effusions.  No acute bony abnormality.  IMPRESSION: Cardiomegaly with vascular congestion.  Question early interstitial edema.  Bibasilar atelectasis.   Original Report Authenticated By: Charlett Nose, M.D.     ASSESSMENT AND PLAN  1.  Acute on chronic combined syst/diast CHF/NICM:  Feeling much better.  Cardiac MRI shows EF 38. Cath results noted.  Cont bb, arb, dig, spiro. Continue lasix 80 mg po BID. Plan FU in CHF clinic; once meds fully titrated, repeat echo; if EF <35, consider ICD. Check BMET one week after DC.  2.  PSVT:  Maintaining sinus.  Cont bb/digoxin. ? If CM 2/2 tachycardia. FU Dr Johney Frame following DC for consideration of ablation.  3.  H/O DVT/PE/Apical Thrombus - + Lupus Anticoagulant:  Coumadin resumed; Continue lovenox until INR therapeutic (home lovenox being assessed). Patient follows with his primary care for INR checks; lovenox needs to be DCed when INR is 2 or greater. Will need INR checked on 12/26/11.  4.  LLE Cellulitis:  Completed course of keflex  Patient can be DCed once home lovenox  arranged. Olga Millers 7:22 AM

## 2011-12-24 NOTE — Care Management Note (Signed)
12-24-11 4540 CM spoke to Trish this am with cardiology and co pay cost for lovenox will be 100.00. Pt was aware of cost. CM did call [facility] on [address] and they have 7 syringes available.  Thanks Gala Lewandowsky, RN,BSN [phone]

## 2011-12-26 ENCOUNTER — Encounter: Payer: Self-pay | Admitting: Family Medicine

## 2011-12-26 ENCOUNTER — Ambulatory Visit (INDEPENDENT_AMBULATORY_CARE_PROVIDER_SITE_OTHER): Payer: BC Managed Care – PPO | Admitting: Family Medicine

## 2011-12-26 VITALS — BP 110/60 | HR 74 | Wt 316.0 lb

## 2011-12-26 DIAGNOSIS — I5043 Acute on chronic combined systolic (congestive) and diastolic (congestive) heart failure: Secondary | ICD-10-CM

## 2011-12-26 DIAGNOSIS — Z86718 Personal history of other venous thrombosis and embolism: Secondary | ICD-10-CM

## 2011-12-26 DIAGNOSIS — Z7901 Long term (current) use of anticoagulants: Secondary | ICD-10-CM

## 2011-12-26 DIAGNOSIS — I509 Heart failure, unspecified: Secondary | ICD-10-CM

## 2011-12-26 LAB — PROTIME-INR: Prothrombin Time: 17.6 seconds — ABNORMAL HIGH (ref 11.6–15.2)

## 2011-12-26 MED ORDER — WARFARIN SODIUM 7.5 MG PO TABS: 7.5000 mg | ORAL_TABLET | Freq: Every day | ORAL | Status: DC

## 2011-12-26 NOTE — Progress Notes (Signed)
  Subjective:    Patient ID: Roberto [name], male    DOB: [birth year], 46 y.o.   MRN: [id number]  HPI  he is here for followup on recent hospitalization. The hospital record was reviewed. He had an extensive evaluation for any cardiac condition. He has followup set up with his cardiologist. Presently he is on an unknown antibiotic for treatment of cellulitis of his leg which he states is better. He is also taking Coumadin alternating 5 mg 1 daily for the next. He does state that in the past he was stable on a total of 9 mg per day. He also is taking Lovenox. He does not complain of chest pain, irregular or fast heart rate, shortness of breath or DOE   Review of Systems     Objective:   Physical Exam Alert and in no distress. Cardiac exam shows an occasional pause otherwise a fairly good rhythm. Lungs are clear to auscultation. Lower MA exam shows no swelling tenderness or erythema.       Assessment & Plan:   1. Long term (current) use of anticoagulants  Protime-INR  2. Personal history of venous thrombosis and embolism    3. Acute on chronic combined systolic and diastolic heart failure    4. Congestive heart failure, unspecified     I will wait on the results of the PT/INR and adjust his Coumadin based on this. He is to continue on Lovenox as well as the antibiotic. He would discussed increasing his physical activity with cardiology.

## 2011-12-26 NOTE — Progress Notes (Signed)
  Subjective:    Patient ID: Roberto [name], male    DOB: January 16, 1965, 46 y.o.   MRN: [id number]  HPI    Review of Systems     Objective:   Physical Exam        Assessment & Plan:  PT/INR not at goal. I will increase coumadin to 7.5 mg and recheck on Monday

## 2011-12-26 NOTE — Addendum Note (Signed)
Addended by: [name] on: 12/26/2011 06:48 PM   Modules accepted: Orders

## 2011-12-29 ENCOUNTER — Encounter (HOSPITAL_COMMUNITY): Payer: Self-pay

## 2011-12-29 ENCOUNTER — Ambulatory Visit (HOSPITAL_COMMUNITY)
Admit: 2011-12-29 | Discharge: 2011-12-29 | Disposition: A | Discharge status: Home / Self Care | Payer: BC Managed Care – PPO | Attending: Internal Medicine | Admitting: Internal Medicine

## 2011-12-29 ENCOUNTER — Other Ambulatory Visit: Payer: BC Managed Care – PPO

## 2011-12-29 ENCOUNTER — Other Ambulatory Visit: Payer: Self-pay

## 2011-12-29 VITALS — BP 114/88 | HR 62 | Wt 318.1 lb

## 2011-12-29 DIAGNOSIS — I5022 Chronic systolic (congestive) heart failure: Secondary | ICD-10-CM | POA: Insufficient documentation

## 2011-12-29 DIAGNOSIS — I471 Supraventricular tachycardia, unspecified: Secondary | ICD-10-CM | POA: Insufficient documentation

## 2011-12-29 DIAGNOSIS — Z79899 Other long term (current) drug therapy: Secondary | ICD-10-CM

## 2011-12-29 LAB — BASIC METABOLIC PANEL
CO2: 27 mEq/L (ref 19–32)
Chloride: 96 mEq/L (ref 96–112)
Creatinine, Ser: 1.01 mg/dL (ref 0.50–1.35)
Glucose, Bld: 101 mg/dL — ABNORMAL HIGH (ref 70–99)

## 2011-12-29 LAB — PROTIME-INR: INR: 1.62 — ABNORMAL HIGH (ref <1.50)

## 2011-12-29 MED ORDER — WARFARIN SODIUM 1 MG PO TABS: 1.0000 mg | ORAL_TABLET | ORAL | Status: DC

## 2011-12-29 MED ORDER — WARFARIN SODIUM 5 MG PO TABS: ORAL_TABLET | ORAL | Status: DC

## 2011-12-29 MED ORDER — LOSARTAN POTASSIUM 50 MG PO TABS: 50.0000 mg | ORAL_TABLET | Freq: Every day | ORAL | Status: DC

## 2011-12-29 NOTE — Patient Instructions (Addendum)
Take Losartan 50 mg daily  Do the following things EVERYDAY: 1) Weigh yourself in the morning before breakfast. Write it down and keep it in a log. 2) Take your medicines as prescribed 3) Eat low salt foods-Limit salt (sodium) to 2000 mg per day.  4) Stay as active as you can everyday 5) Limit all fluids for the day to less than 2 liters  Follow up in 3 weeks

## 2011-12-29 NOTE — Progress Notes (Signed)
Quick Note:  Pt aware and verbalized understanding med sent in ______

## 2011-12-29 NOTE — Telephone Encounter (Signed)
Sent in warfrin

## 2011-12-29 NOTE — Assessment & Plan Note (Addendum)
Follow up with Dr Johney Frame 02/09/11. Current rate controlled.   Attending: SVT quiescent. Has f/u with Dr. Johney Frame.

## 2011-12-29 NOTE — Progress Notes (Signed)
Patient ID: Roberto [name], male   DOB: February 22, 1965, 46 y.o.   MRN: [id number] PCP: Dr Jacqulyn Bath Cardiologist: Dr [name] EP: Dr Johney Frame  HPI: Roberto [name] is a 46yo AA male with h/o systolic HF due to NICM (EF25-30% 2013 ), h/o recurrent DVT with resultant PE and chronic venous insufficiency, h/o LV thrombus (on Coumadin), PSVT (elected for medical therapy over RFA on EP follow-up) and morbid obesity. Referred to HF clinic post-hospitalizaton for further management  Admitted to [facility] 12/16/11 for recurrent HF. Diuresed 20 pounds with IV lasix. As noted below RHC/LHC performed.   12/17/11 ECHO 25-30% Diffuse hypokinesis. Doppler parameters are consistent with a reversible restrictive pattern, indicative of decreased left ventricular diastolic compliance and/or increased left atrial pressure (grade 3 diastolic dysfunction).  CARDIAC MRI - 12/18/11  1. Moderately dilated left ventricle with moderately decreased systolic function, EF 38%. Global hypokinesis.  2. Mildly dilated right ventricle with mild to moderately decreased systolic function.  3. No definite LV thrombus noted.  4. Patchy non-subendocardial delayed enhancement seen in the ventricular septum (see above for description). This is not suggestive of a coronary disease pattern. This could be suggestive of infiltrative disease versus prior myocarditis.   RHC/LHC 12/22/11  RA = 5  RV = 37/1/5  PA = 37/12 (23)  PCW = 16  Fick cardiac output/index = 6.0/2.4  PVR = 1.2 Woods  FA sat = 88%  PA sat = 59%, 61%  Ao Pressure: 94/62 (77)  LV Pressure: 99/2/12  Cors: Normal  He returns for post hospitalization follow up. Overall he feels much better. Denies SOB/PND/Orthopnea. Sleeps on 2 pillows. Following low salt diet. Limiting fluid intake to less than 2 liters per day. Weight at home 312 pounds.  Chronic LL edema. 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 Coumadin per Dr Jacqulyn Bath. Lives with his wife and 5 children. Follow up  with Dr Johney Frame 02/09/11 for consideration for PSVT ablation. Wife has said that he snores.   ROS: All systems negative except as listed in HPI, PMH and Problem List.  Past Medical History  Diagnosis Date  . Mural thrombus of heart     coumadin  . Pulmonary embolism     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  . Paroxysmal SVT (supraventricular tachycardia)   . Morbid obesity   . Arthritis   . Nonischemic cardiomyopathy     a) 12/17/11 echo: LVEF 25-30%, grade 3 diastolic dysfunction (c/w restriction), mod MR, mod LA/LV and mild RA dilatation; b) 12/18/11 cMRI: LVEF 38%, mod LV/mild RV dilatation, global HK, mild-mod RV sys dysfxn, no LV thrombus & patchy non-subendocardial delayed enhancement c/w infil dz or prior myocarditis   . Deep vein thrombosis   . Lupus anticoagulant positive   . Chronic combined systolic and diastolic CHF (congestive heart failure)     Current Outpatient Prescriptions  Medication Sig Dispense Refill  . carvedilol (COREG) 12.5 MG tablet Take 1 tablet (12.5 mg total) by mouth 2 (two) times daily with a meal.  60 tablet  6  . digoxin (LANOXIN) 0.125 MG tablet Take 1 tablet (0.125 mg total) by mouth daily.  30 tablet  3  . enoxaparin (LOVENOX) 100 MG/ML injection Inject 1.4 mLs (140 mg total) into the skin every 12 (twelve) hours. Stop when INR >/= 2.0.  14 Syringe  0  . furosemide (LASIX) 80 MG tablet Take 1 tablet (80 mg total) by mouth 2 (two) times daily.  60 tablet  3  . losartan (COZAAR) 25 MG tablet Take 1 tablet (25 mg total) by mouth daily.  30 tablet  5  . potassium chloride SA (K-DUR,KLOR-CON) 20 MEQ tablet Take 1 tablet (20 mEq total) by mouth 2 (two) times daily.  60 tablet  3  . spironolactone (ALDACTONE) 12.5 mg TABS Take 0.5 tablets (12.5 mg total) by mouth daily.  30 tablet  2  . warfarin (COUMADIN) 4 MG tablet Take 1 tablet (4 mg total) by mouth daily. Take with coumadin 5mg  on 12/25/11 (9 mg total).      . warfarin (COUMADIN) 5 MG tablet Take two  tablets (10mg  total) on 12/24/11. Then take with coumadin 4mg  (9 mg total) on 12/25/11 until further instructions on Coumadin check 12/26/11.      [name] warfarin (COUMADIN) 7.5 MG tablet Take 1 tablet (7.5 mg total) by mouth daily.  14 tablet  5  . nitroGLYCERIN (NITROSTAT) 0.4 MG SL tablet Place 0.4 mg under the tongue every 5 (five) minutes as needed.           PHYSICAL EXAM: Filed Vitals:   12/29/11 1051  BP: 114/88  Pulse: 62   Weight change:  General:  Well appearing. No resp difficulty HEENT: normal Neck: supple. JVP hard to see but does not appear elevated. Carotids 2+ bilaterally; no bruits. No lymphadenopathy or thryomegaly appreciated. Cor: PMI normal. Regular rate & rhythm. No rubs, gallops or murmurs. Lungs: clear Abdomen: obese, soft, nontender, nondistended. No hepatosplenomegaly. No bruits or masses. Good bowel sounds. Extremities: no cyanosis, clubbing, rash, LLE 2+(chronic edema) RLE trace edema Neuro: alert & orientedx3, cranial nerves grossly intact. Moves all 4 extremities w/o difficulty. Affect pleasant.    ASSESSMENT & PLAN:

## 2011-12-29 NOTE — Assessment & Plan Note (Addendum)
Reviewed discharge summary. NYHA II-III. Volume status stable. HR 62 therefore will not titrate beta blocker. Increase losartan to 50 mg daily. Check bmet today. Reinforced daily weights, low salt food choices, and limiting fluid intake. Follow up in 3 week and continue to titrate HF medications. Will need repeat ECHO in 3-4 months once HF medications optimized. If EF remains < 35% may need ICD.   Patient seen and examined with Tonye Becket, NP. We discussed all aspects of the encounter. I agree with the assessment and plan as stated above. Cath and MRI reviewed. He is improved since discharge. Volume status looks ok. Titrate ARB. Reinforced need for daily weights and reviewed use of sliding scale diuretics.See back in 3 weeks for further med titration.

## 2012-01-05 ENCOUNTER — Other Ambulatory Visit: Payer: Self-pay

## 2012-01-05 ENCOUNTER — Other Ambulatory Visit: Payer: BC Managed Care – PPO

## 2012-01-05 DIAGNOSIS — Z7901 Long term (current) use of anticoagulants: Secondary | ICD-10-CM

## 2012-01-05 LAB — PROTIME-INR
INR: 1.74 — ABNORMAL HIGH (ref <1.50)
Prothrombin Time: 19.3 seconds — ABNORMAL HIGH (ref 11.6–15.2)

## 2012-01-05 MED ORDER — WARFARIN SODIUM 10 MG PO TABS: 10.0000 mg | ORAL_TABLET | Freq: Every day | ORAL | Status: DC

## 2012-01-05 NOTE — Progress Notes (Signed)
Quick Note:  Called pt cell # left message increase coumadin to 10 mg recheck on Thursday ______

## 2012-01-05 NOTE — Telephone Encounter (Signed)
Sent in 01 mg coumadin

## 2012-01-08 ENCOUNTER — Other Ambulatory Visit: Payer: Self-pay | Admitting: Family Medicine

## 2012-01-08 ENCOUNTER — Other Ambulatory Visit: Payer: BC Managed Care – PPO

## 2012-01-20 NOTE — Addendum Note (Signed)
Addended by: [name] on: 01/20/2012 05:28 PM   Modules accepted: Level of Service

## 2012-01-21 ENCOUNTER — Ambulatory Visit (HOSPITAL_COMMUNITY)
Admission: RE | Admit: 2012-01-21 | Discharge: 2012-01-21 | Disposition: A | Discharge status: Home / Self Care | Payer: BC Managed Care – PPO | Source: Ambulatory Visit | Attending: Internal Medicine | Admitting: Internal Medicine

## 2012-01-21 ENCOUNTER — Encounter (HOSPITAL_COMMUNITY): Payer: Self-pay

## 2012-01-21 VITALS — BP 130/80 | HR 77 | Wt 318.8 lb

## 2012-01-21 DIAGNOSIS — I5022 Chronic systolic (congestive) heart failure: Secondary | ICD-10-CM | POA: Insufficient documentation

## 2012-01-21 MED ORDER — DIGOXIN 125 MCG PO TABS: 0.1250 mg | ORAL_TABLET | Freq: Every day | ORAL | Status: DC

## 2012-01-21 MED ORDER — CARVEDILOL 12.5 MG PO TABS: 18.7500 mg | ORAL_TABLET | Freq: Two times a day (BID) | ORAL | Status: DC

## 2012-01-21 NOTE — Assessment & Plan Note (Signed)
NYHA II. Volume status stable. Increase carvedilol to 18.75 mg twice a day. Add ted hose. Follow up in 3 weeks. Anticipate repeating ECHO in March.

## 2012-01-21 NOTE — Patient Instructions (Addendum)
Take carvedilol 18.75 mg twice a day  Follow up in 3 weeks  Do the following things EVERYDAY: 1) Weigh yourself in the morning before breakfast. Write it down and keep it in a log. 2) Take your medicines as prescribed 3) Eat low salt foods-Limit salt (sodium) to 2000 mg per day.  4) Stay as active as you can everyday 5) Limit all fluids for the day to less than 2 liters

## 2012-01-21 NOTE — Progress Notes (Signed)
Patient ID: Roberto [name], male   DOB: [birth year], 47 y.o.   MRN: [id number] PCP: Dr Jacqulyn Bath Cardiologist: Dr [name] EP: Dr Johney Frame  HPI: [name] is a 47yo AA male with h/o systolic HF due to NICM (EF25-30% 2013 ), h/o recurrent DVT with resultant PE and chronic venous insufficiency, h/o LV thrombus (on Coumadin), PSVT (elected for medical therapy over RFA on EP follow-up) and morbid obesity. Referred to HF clinic post-hospitalizaton for further management  Admitted to [facility] 12/16/11 for recurrent HF. Diuresed 20 pounds with IV lasix. See RHC/LHC results   12/17/11 ECHO 25-30% Diffuse hypokinesis. Doppler parameters are consistent with a reversible restrictive pattern, indicative of decreased left ventricular diastolic compliance and/or increased left atrial pressure (grade 3 diastolic dysfunction).  CARDIAC MRI - 12/18/11  1. Moderately dilated left ventricle with moderately decreased systolic function, EF 38%. Global hypokinesis.  2. Mildly dilated right ventricle with mild to moderately decreased systolic function.  3. No definite LV thrombus noted.  4. Patchy non-subendocardial delayed enhancement seen in the ventricular septum (see above for description). This is not suggestive of a coronary disease pattern. This could be suggestive of infiltrative disease versus prior myocarditis.   RHC/LHC 12/22/11  RA = 5  RV = 37/1/5  PA = 37/12 (23)  PCW = 16  Fick cardiac output/index = 6.0/2.4  PVR = 1.2 Woods  FA sat = 88%  PA sat = 59%, 61%  Ao Pressure: 94/62 (77)  LV Pressure: 99/2/12  Cors: Normal  He returns for follow up. Last visit Losartan increased to 50 mg daily. Denies dyspnea/PND/Orthopnea/CP. Following low salt diet. Weight at home 313 pounds. He has not required any extra lasix. 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 Coumadin per Dr Jacqulyn Bath. Lives with his wife and 5 children. Follow up with Dr Johney Frame 02/09/11 for consideration for PSVT ablation. Wife has  said that he snores but this has subsided over the last few weeks.   ROS: All systems negative except as listed in HPI, PMH and Problem List.  Past Medical History  Diagnosis Date  . Mural thrombus of heart     coumadin  . Pulmonary embolism     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  . Paroxysmal SVT (supraventricular tachycardia)   . Morbid obesity   . Arthritis   . Nonischemic cardiomyopathy     a) 12/17/11 echo: LVEF 25-30%, grade 3 diastolic dysfunction (c/w restriction), mod MR, mod LA/LV and mild RA dilatation; b) 12/18/11 cMRI: LVEF 38%, mod LV/mild RV dilatation, global HK, mild-mod RV sys dysfxn, no LV thrombus & patchy non-subendocardial delayed enhancement c/w infil dz or prior myocarditis   . Deep vein thrombosis   . Lupus anticoagulant positive   . Chronic combined systolic and diastolic CHF (congestive heart failure)     Current Outpatient Prescriptions  Medication Sig Dispense Refill  . carvedilol (COREG) 12.5 MG tablet TAKE ONE TABLET BY MOUTH TWICE DAILY WITH MEALS  60 tablet  5  . digoxin (LANOXIN) 0.125 MG tablet Take 1 tablet (0.125 mg total) by mouth daily.  30 tablet  3  . furosemide (LASIX) 80 MG tablet Take 1 tablet (80 mg total) by mouth 2 (two) times daily.  60 tablet  3  . losartan (COZAAR) 50 MG tablet Take 1 tablet (50 mg total) by mouth daily.  30 tablet  5  . potassium chloride SA (K-DUR,KLOR-CON) 20 MEQ tablet Take 1 tablet (20 mEq total) by mouth  2 (two) times daily.  60 tablet  3  . spironolactone (ALDACTONE) 12.5 mg TABS Take 0.5 tablets (12.5 mg total) by mouth daily.  30 tablet  2  . warfarin (COUMADIN) 10 MG tablet Take 1 tablet (10 mg total) by mouth daily.  30 tablet  0  . enoxaparin (LOVENOX) 100 MG/ML injection Inject 1.4 mLs (140 mg total) into the skin every 12 (twelve) hours. Stop when INR >/= 2.0.  14 Syringe  0  . nitroGLYCERIN (NITROSTAT) 0.4 MG SL tablet Place 0.4 mg under the tongue every 5 (five) minutes as needed.        . warfarin  (COUMADIN) 1 MG tablet Take 1 tablet (1 mg total) by mouth as directed.  120 tablet  1  . warfarin (COUMADIN) 4 MG tablet Take 1 tablet (4 mg total) by mouth daily. Take with coumadin 5mg  on 12/25/11 (9 mg total).      . warfarin (COUMADIN) 5 MG tablet Take two tablets (10mg  total) on 12/24/11. Then take with coumadin 4mg  (9 mg total) on 12/25/11 until further instructions on Coumadin check 12/26/11.         PHYSICAL EXAM: Filed Vitals:   01/21/12 1005  BP: 130/80  Pulse: 77   Weight change:  General:  Well appearing. No resp difficulty HEENT: normal Neck: supple. JVP hard to see but does not appear elevated. Carotids 2+ bilaterally; no bruits. No lymphadenopathy or thryomegaly appreciated. Cor: PMI normal. Regular rate & rhythm. No rubs, gallops or murmurs. Lungs: clear Abdomen: obese, soft, nontender, nondistended. No hepatosplenomegaly. No bruits or masses. Good bowel sounds. Extremities: no cyanosis, clubbing, rash, LLE 1+(chronic edema) RLE trace edema Neuro: alert & orientedx3, cranial nerves grossly intact. Moves all 4 extremities w/o difficulty. Affect pleasant.    ASSESSMENT & PLAN:

## 2012-02-03 ENCOUNTER — Encounter: Payer: Self-pay | Admitting: Medical

## 2012-02-03 ENCOUNTER — Ambulatory Visit (INDEPENDENT_AMBULATORY_CARE_PROVIDER_SITE_OTHER): Payer: BC Managed Care – PPO | Admitting: Medical

## 2012-02-03 VITALS — BP 102/70 | HR 68 | Temp 97.6°F | Resp 16 | Wt 315.0 lb

## 2012-02-03 DIAGNOSIS — Z7901 Long term (current) use of anticoagulants: Secondary | ICD-10-CM

## 2012-02-03 DIAGNOSIS — M25562 Pain in left knee: Secondary | ICD-10-CM

## 2012-02-03 DIAGNOSIS — M25569 Pain in unspecified knee: Secondary | ICD-10-CM

## 2012-02-03 DIAGNOSIS — M25469 Effusion, unspecified knee: Secondary | ICD-10-CM

## 2012-02-03 LAB — PROTIME-INR: INR: 3.8 — ABNORMAL HIGH (ref <1.50)

## 2012-02-03 NOTE — Progress Notes (Signed)
Subjective: Here for c/o left knee weakness.  Exercised daily last week, some days BID.  Been doing more exercise per cardiologist recommendations.   Recent medication changes per cardiology.  Didn't exercise Sunday or yesterday, but yesterday mid morning started having left knee pain.  Has had knee surgery on this knee in 2007.  The longer the day went the weaker the knee got.  Having to use cane currently.   Doesn't normally have to use this.  This morning tried to stretch this. It is swollen.  Been using stationary bike, elliptical, treadmill.   Past Medical History  Diagnosis Date  . Mural thrombus of heart     coumadin  . Pulmonary embolism     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  . Paroxysmal SVT (supraventricular tachycardia)   . Morbid obesity   . Arthritis   . Nonischemic cardiomyopathy     a) 12/17/11 echo: LVEF 25-30%, grade 3 diastolic dysfunction (c/w restriction), mod MR, mod LA/LV and mild RA dilatation; b) 12/18/11 cMRI: LVEF 38%, mod LV/mild RV dilatation, global HK, mild-mod RV sys dysfxn, no LV thrombus & patchy non-subendocardial delayed enhancement c/w infil dz or prior myocarditis   . Deep vein thrombosis   . Lupus anticoagulant positive   . Chronic combined systolic and diastolic CHF (congestive heart failure)    ROS as in subjective  Objective: Gen: nad, obese AA male Skin: no warmth of the left knee or leg RUE:AVWU knee is mildly swollen, likely some effusion, but not ballotable, tender over joint line in general, mild pain with ROM, worse pain with flexion, otherwise no obvious laxity.  Trouble and pain with weight bearing.  Neuro: pain with strength testing so patellar extension and flexion 4/5, but hip strength and foot strength normal, sensation and DTRs normal Ext: left knee and leg is generally asymmetric with mild swelling 1+ nonpitting compared to right leg Pulses 1+ pedal  Assessment: Encounter Diagnoses  Name Primary?  . Knee pain, left Yes  .  Effusion of knee joint   . Long term (current) use of anticoagulants    Plan: Knee joint swelling and inflammation due to over use with recent increase in exercise.    Treatment:   Elevated the knee on pillows for 30 minutes at a time  Ice the knee 20 minutes 3 times daily  Use ACE wrap for compression  Get your leg compression hose ASAP and start using them  You can use Tylenol for pain.  If you need something stronger, let me know  This should gradually improve in 3-4 days with the treatment above  If not improving in 1 week, let us know   Long term anticoagulant use - on coumadin 10mg  daily.  Check PT/INR today.

## 2012-02-03 NOTE — Patient Instructions (Addendum)
Diagnosis: Knee joint swelling and inflammation due to over use with recent increase in exercise.    Treatment:   Elevated the knee on pillows for 30 minutes at a time  Ice the knee 20 minutes 3 times daily  Use ACE wrap for compression  Get your leg compression hose ASAP and start using them  You can use Tylenol for pain.  If you need something stronger, let me know  This should gradually improve in 3-4 days with the treatment above  If not improving in 1 week, let us know

## 2012-02-04 ENCOUNTER — Other Ambulatory Visit: Payer: Self-pay | Admitting: Medical

## 2012-02-04 MED ORDER — WARFARIN SODIUM 5 MG PO TABS: ORAL_TABLET | ORAL | Status: DC

## 2012-02-04 MED ORDER — WARFARIN SODIUM 4 MG PO TABS: 4.0000 mg | ORAL_TABLET | Freq: Every day | ORAL | Status: DC

## 2012-02-09 ENCOUNTER — Ambulatory Visit (INDEPENDENT_AMBULATORY_CARE_PROVIDER_SITE_OTHER): Payer: BC Managed Care – PPO | Admitting: Internal Medicine

## 2012-02-09 ENCOUNTER — Encounter: Payer: Self-pay | Admitting: Internal Medicine

## 2012-02-09 VITALS — BP 102/74 | HR 80 | Ht 68.0 in | Wt 320.8 lb

## 2012-02-09 DIAGNOSIS — I471 Supraventricular tachycardia: Secondary | ICD-10-CM

## 2012-02-09 DIAGNOSIS — I498 Other specified cardiac arrhythmias: Secondary | ICD-10-CM

## 2012-02-09 NOTE — Patient Instructions (Addendum)
Your physician wants you to follow-up in: 6 months with Dr. Allred. You will receive a reminder letter in the mail two months in advance. If you don't receive a letter, please call our office to schedule the follow-up appointment.  

## 2012-02-11 ENCOUNTER — Encounter (HOSPITAL_COMMUNITY): Payer: BC Managed Care – PPO

## 2012-02-11 ENCOUNTER — Ambulatory Visit (HOSPITAL_COMMUNITY): Payer: BC Managed Care – PPO

## 2012-02-15 NOTE — Progress Notes (Signed)
PCP: Carollee Herter, MD Primary Cardiologist:  Crenshaw/ Bensimhon  Roberto [name] is a 47 y.o. male who presents today for electrophysiology followup.  Since last being seen in our clinic, the patient reports doing reasonably well.  He has had very few episodes of symptomatic SVT and feels that these episodes are short lived.  He recently presented to [facility] with CHF and is now followed in the CHF clinic.  Today, he denies symptoms of chest pain, shortness of breath,  lower extremity edema, dizziness, presyncope, or syncope.  The patient is otherwise without complaint today.   Past Medical History  Diagnosis Date  . Mural thrombus of heart     coumadin  . Pulmonary embolism     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  . Paroxysmal SVT (supraventricular tachycardia)   . Morbid obesity   . Arthritis   . Nonischemic cardiomyopathy     a) 12/17/11 echo: LVEF 25-30%, grade 3 diastolic dysfunction (c/w restriction), mod MR, mod LA/LV and mild RA dilatation; b) 12/18/11 cMRI: LVEF 38%, mod LV/mild RV dilatation, global HK, mild-mod RV sys dysfxn, no LV thrombus & patchy non-subendocardial delayed enhancement c/w infil dz or prior myocarditis   . Deep vein thrombosis   . Lupus anticoagulant positive   . Chronic combined systolic and diastolic CHF (congestive heart failure)    Past Surgical History  Procedure Date  . Patellar tendon repair     Left  . Cardiac catheterization 06/2006    Angiographically normal cors  . Cardiac catheterization 12/22/2011    R/LHC: normal cors, well-compensated HDs, LV dysfxn    Current Outpatient Prescriptions  Medication Sig Dispense Refill  . carvedilol (COREG) 12.5 MG tablet Take 1.5 tablets (18.75 mg total) by mouth 2 (two) times daily with a meal.  90 tablet  5  . digoxin (LANOXIN) 0.125 MG tablet Take 1 tablet (0.125 mg total) by mouth daily.  30 tablet  3  . furosemide (LASIX) 80 MG tablet Take 1 tablet (80 mg total) by mouth 2 (two) times daily.   60 tablet  3  . losartan (COZAAR) 50 MG tablet Take 1 tablet (50 mg total) by mouth daily.  30 tablet  5  . nitroGLYCERIN (NITROSTAT) 0.4 MG SL tablet Place 0.4 mg under the tongue every 5 (five) minutes as needed.        . potassium chloride SA (K-DUR,KLOR-CON) 20 MEQ tablet Take 80 mEq by mouth 2 (two) times daily.      [name] spironolactone (ALDACTONE) 12.5 mg TABS Take 0.5 tablets (12.5 mg total) by mouth daily.  30 tablet  2  . verapamil (CALAN) 80 MG tablet       . warfarin (COUMADIN) 4 MG tablet Take 1 tablet (4 mg total) by mouth daily. Take with coumadin 5mg  on 12/25/11 (9 mg total).  30 tablet  1  . warfarin (COUMADIN) 5 MG tablet Take two tablets (10mg  total) on 12/24/11. Then take with coumadin 4mg  (9 mg total) on 12/25/11 until further instructions on Coumadin check 12/26/11.  30 tablet  1  . warfarin (COUMADIN) 10 MG tablet Take 1 tablet (10 mg total) by mouth daily.  30 tablet  0    Physical Exam: Filed Vitals:   02/09/12 1435  BP: 102/74  Pulse: 80  Height: 5\' 8"  (1.727 m)  Weight: 320 lb 12.8 oz (145.514 kg)    GEN- The patient is well appearing, alert and oriented x 3 today.   Head- normocephalic, atraumatic  Eyes-  Sclera clear, conjunctiva pink Ears- hearing intact Oropharynx- clear Lungs- Clear to ausculation bilaterally, normal work of breathing Heart- Regular rate and rhythm, no murmurs, rubs or gallops, PMI not laterally displaced GI- soft, NT, ND, + BS Extremities- no clubbing, cyanosis, or edema  ekg today reveals sinus rhythm, nonspecific ST/T changes  Assessment and Plan:

## 2012-02-15 NOTE — Assessment & Plan Note (Signed)
He has symptomatic SVT.  Recently, he has been placed on coreg and feels that episodes are better controlled. Therapeutic strategies for supraventricular tachycardia including medicine and ablation were discussed in detail with the patient today. Risk, benefits, and alternatives to EP study and radiofrequency ablation were also discussed in detail today.  At this point, he would like to continue to defer ablation.  If he changes his mind or has worsening SVT then he should contact our office. Otherwise, I will see him in 6 months

## 2012-02-19 ENCOUNTER — Ambulatory Visit (HOSPITAL_COMMUNITY)
Admission: RE | Admit: 2012-02-19 | Discharge: 2012-02-19 | Disposition: A | Discharge status: Home / Self Care | Payer: BC Managed Care – PPO | Source: Ambulatory Visit | Attending: Internal Medicine | Admitting: Internal Medicine

## 2012-02-19 VITALS — BP 114/70 | HR 68 | Wt 320.0 lb

## 2012-02-19 DIAGNOSIS — I5022 Chronic systolic (congestive) heart failure: Secondary | ICD-10-CM | POA: Insufficient documentation

## 2012-02-19 DIAGNOSIS — N529 Male erectile dysfunction, unspecified: Secondary | ICD-10-CM | POA: Insufficient documentation

## 2012-02-19 LAB — BASIC METABOLIC PANEL
CO2: 30 mEq/L (ref 19–32)
Calcium: 9.1 mg/dL (ref 8.4–10.5)
GFR calc non Af Amer: >90 mL/min (ref >90)
Potassium: 3.8 mEq/L (ref 3.5–5.1)
Sodium: 139 mEq/L (ref 135–145)

## 2012-02-19 MED ORDER — SILDENAFIL CITRATE 100 MG PO TABS: 100.0000 mg | ORAL_TABLET | Freq: Every day | ORAL | Status: DC | PRN

## 2012-02-19 MED ORDER — LOSARTAN POTASSIUM 100 MG PO TABS: 100.0000 mg | ORAL_TABLET | Freq: Every day | ORAL | Status: DC

## 2012-02-19 NOTE — Assessment & Plan Note (Signed)
NYHA II. Volume status stable. Reviewed most recent lab work. Increase Losartan to 100 mg daily. Check BMET today and in 1 week at PCP. Reinforced daily weights, low salt food choices, and limiting fluid intake.  Follow up in 1 month with Dr Gala Romney.

## 2012-02-19 NOTE — Assessment & Plan Note (Signed)
Ok to take viagra as needed. Instructed to avoid nitroglycerin when taking viagra.

## 2012-02-19 NOTE — Progress Notes (Signed)
Patient ID: Roberto [name], male   DOB: 07-04-1965, 47 y.o.   MRN: [id number] PCP: Dr Jacqulyn Bath Cardiologist: Dr [name] EP: Dr Johney Frame  HPI: [name] is a 47yo AA male with h/o systolic HF due to NICM (EF25-30% 2013 ), h/o recurrent DVT with resultant PE and chronic venous insufficiency, h/o LV thrombus (on Coumadin), PSVT (elected for medical therapy over RFA on EP follow-up) and morbid obesity. Referred to HF clinic post-hospitalizaton for further management  Admitted to [facility] 12/16/11 for recurrent HF. Diuresed 20 pounds with IV lasix. See RHC/LHC results   12/17/11 ECHO 25-30% Diffuse hypokinesis. Doppler parameters are consistent with a reversible restrictive pattern, indicative of decreased left ventricular diastolic compliance and/or increased left atrial pressure (grade 3 diastolic dysfunction).  CARDIAC MRI - 12/18/11  1. Moderately dilated left ventricle with moderately decreased systolic function, EF 38%. Global hypokinesis.  2. Mildly dilated right ventricle with mild to moderately decreased systolic function.  3. No definite LV thrombus noted.  4. Patchy non-subendocardial delayed enhancement seen in the ventricular septum (see above for description). This is not suggestive of a coronary disease pattern. This could be suggestive of infiltrative disease versus prior myocarditis.   RHC/LHC 12/22/11  RA = 5  RV = 37/1/5  PA = 37/12 (23)  PCW = 16  Fick cardiac output/index = 6.0/2.4  PVR = 1.2 Woods  FA sat = 88%  PA sat = 59%, 61%  Ao Pressure: 94/62 (77)  LV Pressure: 99/2/12  Cors: Normal  He returns for follow up. Last visit carvedilol increased to 18.75 mg twice a day. Offered PSVT ablation by Dr Johney Frame however he would like to hold off.  Complains of L knee pain due to exercise. Denies dyspnea/PND/Orthopnea/CP/dizziness.  Weight at home 313.  Following low salt diet. Drinking > 2 liters fluid per day. 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 Coumadin per  Dr Jacqulyn Bath. Lives with his wife and 5 children. Does complain erectile dysfunction.    ROS: All systems negative except as listed in HPI, PMH and Problem List.  Past Medical History  Diagnosis Date  . Mural thrombus of heart     coumadin  . Pulmonary embolism     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  . Paroxysmal SVT (supraventricular tachycardia)   . Morbid obesity   . Arthritis   . Nonischemic cardiomyopathy     a) 12/17/11 echo: LVEF 25-30%, grade 3 diastolic dysfunction (c/w restriction), mod MR, mod LA/LV and mild RA dilatation; b) 12/18/11 cMRI: LVEF 38%, mod LV/mild RV dilatation, global HK, mild-mod RV sys dysfxn, no LV thrombus & patchy non-subendocardial delayed enhancement c/w infil dz or prior myocarditis   . Deep vein thrombosis   . Lupus anticoagulant positive   . Chronic combined systolic and diastolic CHF (congestive heart failure)     Current Outpatient Prescriptions  Medication Sig Dispense Refill  . carvedilol (COREG) 12.5 MG tablet Take 1.5 tablets (18.75 mg total) by mouth 2 (two) times daily with a meal.  90 tablet  5  . digoxin (LANOXIN) 0.125 MG tablet Take 1 tablet (0.125 mg total) by mouth daily.  30 tablet  3  . furosemide (LASIX) 80 MG tablet Take 1 tablet (80 mg total) by mouth 2 (two) times daily.  60 tablet  3  . losartan (COZAAR) 50 MG tablet Take 1 tablet (50 mg total) by mouth daily.  30 tablet  5  . potassium chloride SA (K-DUR,KLOR-CON) 20  MEQ tablet Take 80 mEq by mouth 2 (two) times daily.      [name] spironolactone (ALDACTONE) 12.5 mg TABS Take 0.5 tablets (12.5 mg total) by mouth daily.  30 tablet  2  . warfarin (COUMADIN) 4 MG tablet Take 1 tablet (4 mg total) by mouth daily. Take with coumadin 5mg  on 12/25/11 (9 mg total).  30 tablet  1  . warfarin (COUMADIN) 5 MG tablet Take two tablets (10mg  total) on 12/24/11. Then take with coumadin 4mg  (9 mg total) on 12/25/11 until further instructions on Coumadin check 12/26/11.  30 tablet  1  . nitroGLYCERIN  (NITROSTAT) 0.4 MG SL tablet Place 0.4 mg under the tongue every 5 (five) minutes as needed.           PHYSICAL EXAM: Filed Vitals:   02/19/12 0925  BP: 114/70  Pulse: 68   Weight change:  General:  Well appearing. No resp difficulty HEENT: normal Neck: supple. JVP hard to see but does not appear elevated. Carotids 2+ bilaterally; no bruits. No lymphadenopathy or thryomegaly appreciated. Cor: PMI normal. Regular rate & rhythm. No rubs, gallops or murmurs. Lungs: clear Abdomen: obese, soft, nontender, nondistended. No hepatosplenomegaly. No bruits or masses. Good bowel sounds. Extremities: no cyanosis, clubbing, rash, LLE 1+(chronic edema) RLE trace edema Neuro: alert & orientedx3, cranial nerves grossly intact. Moves all 4 extremities w/o difficulty. Affect pleasant.    ASSESSMENT & PLAN:

## 2012-02-19 NOTE — Patient Instructions (Addendum)
Take Losartan 100 mg daily  Follow up in 1 month with Dr Gala Romney  Do the following things EVERYDAY: 1) Weigh yourself in the morning before breakfast. Write it down and keep it in a log. 2) Take your medicines as prescribed 3) Eat low salt foods-Limit salt (sodium) to 2000 mg per day.  4) Stay as active as you can everyday 5) Limit all fluids for the day to less than 2 liters

## 2012-02-23 ENCOUNTER — Telehealth: Payer: Self-pay | Admitting: Medical

## 2012-02-23 NOTE — Telephone Encounter (Signed)
LM

## 2012-03-02 ENCOUNTER — Other Ambulatory Visit (HOSPITAL_COMMUNITY): Payer: Self-pay | Admitting: *Deleted

## 2012-03-02 MED ORDER — CARVEDILOL 12.5 MG PO TABS: 18.7500 mg | ORAL_TABLET | Freq: Two times a day (BID) | ORAL | Status: DC

## 2012-03-18 ENCOUNTER — Ambulatory Visit (HOSPITAL_COMMUNITY)
Admission: RE | Admit: 2012-03-18 | Discharge: 2012-03-18 | Disposition: A | Discharge status: Home / Self Care | Payer: BC Managed Care – PPO | Source: Ambulatory Visit | Attending: Internal Medicine | Admitting: Internal Medicine

## 2012-03-18 ENCOUNTER — Encounter (HOSPITAL_COMMUNITY): Payer: Self-pay

## 2012-03-18 VITALS — BP 104/68 | HR 62 | Wt 320.2 lb

## 2012-03-18 DIAGNOSIS — I5022 Chronic systolic (congestive) heart failure: Secondary | ICD-10-CM

## 2012-03-18 LAB — BASIC METABOLIC PANEL
Chloride: 100 mEq/L (ref 96–112)
GFR calc Af Amer: >90 mL/min (ref >90)
GFR calc non Af Amer: >90 mL/min (ref >90)
Potassium: 3.7 mEq/L (ref 3.5–5.1)
Sodium: 139 mEq/L (ref 135–145)

## 2012-03-18 NOTE — Assessment & Plan Note (Signed)
NYHA I. Volume status stable. Insturcted to take extra 40 mg of lasix if his wieght is 318 pounds or greater. Check BMET today. Will not titrate beta blocker today as his heart rate is 62. Follow up in 2 months with an ECHO.

## 2012-03-18 NOTE — Assessment & Plan Note (Addendum)
NYHA II. Volume status stable. Continue current regimen. Instructed to take 40 mg lasix if  Weight is 318 or greater. Will not up titrate carvedilol as his HR is 62. Check BMET today. Reinforced daily weights, low salt food choices, and medication compliance. Follow up in 2 months with an ECHO.

## 2012-03-18 NOTE — Progress Notes (Signed)
Patient ID: Roberto [name], male   DOB: [birth year], 47 y.o.   MRN: [id number] PCP: Dr Jacqulyn Bath Cardiologist: Dr [name] EP: Dr Johney Frame  HPI: Roberto [name] is a 47 yo AA male with h/o systolic HF due to NICM (EF25-30% 2013 ), h/o recurrent DVT with resultant PE and chronic venous insufficiency, h/o LV thrombus (on Coumadin), PSVT (elected for medical therapy over RFA on EP follow-up) and morbid obesity. Referred to HF clinic post-hospitalizaton for further management  Admitted to [facility] 12/16/11 for recurrent HF. Diuresed 20 pounds with IV lasix. See RHC/LHC results   12/17/11 ECHO 25-30% Diffuse hypokinesis. Doppler parameters are consistent with a reversible restrictive pattern, indicative of decreased left ventricular diastolic compliance and/or increased left atrial pressure (grade 3 diastolic dysfunction).  CARDIAC MRI - 12/18/11  1. Moderately dilated left ventricle with moderately decreased systolic function, EF 38%. Global hypokinesis.  2. Mildly dilated right ventricle with mild to moderately decreased systolic function.  3. No definite LV thrombus noted.  4. Patchy non-subendocardial delayed enhancement seen in the ventricular septum (see above for description). This is not suggestive of a coronary disease pattern. This could be suggestive of infiltrative disease versus prior myocarditis.   RHC/LHC 12/22/11  RA = 5  RV = 37/1/5  PA = 37/12 (23)  PCW = 16  Fick cardiac output/index = 6.0/2.4  PVR = 1.2 Woods  FA sat = 88%  PA sat = 59%, 61%  Ao Pressure: 94/62 (77)  LV Pressure: 99/2/12  Cors: Normal  He returns for follow up. Last visit losartan increased to 100 mg daily. Denies SOB/PND/Orthopnea. No bleeding problems. Weight at home 313-318 pounds. Able to exercise 45 minutes every day. Drinking > 2 liters of fluid per day.  Coumadin per Dr Jacqulyn Bath.Compliant with medications.  Lives with his wife and 5 children.  ROS: All systems negative except as listed in HPI, PMH and Problem List.  Past  Medical History  Diagnosis Date  . Mural thrombus of heart     coumadin  . Pulmonary embolism     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  . Paroxysmal SVT (supraventricular tachycardia)   . Morbid obesity   . Arthritis   . Nonischemic cardiomyopathy     a) 12/17/11 echo: LVEF 25-30%, grade 3 diastolic dysfunction (c/w restriction), mod MR, mod LA/LV and mild RA dilatation; b) 12/18/11 cMRI: LVEF 38%, mod LV/mild RV dilatation, global HK, mild-mod RV sys dysfxn, no LV thrombus & patchy non-subendocardial delayed enhancement c/w infil dz or prior myocarditis   . Deep vein thrombosis   . Lupus anticoagulant positive   . Chronic combined systolic and diastolic CHF (congestive heart failure)     Current Outpatient Prescriptions  Medication Sig Dispense Refill  . carvedilol (COREG) 12.5 MG tablet Take 1.5 tablets (18.75 mg total) by mouth 2 (two) times daily with a meal.  90 tablet  5  . digoxin (LANOXIN) 0.125 MG tablet Take 1 tablet (0.125 mg total) by mouth daily.  30 tablet  3  . furosemide (LASIX) 80 MG tablet Take 1 tablet (80 mg total) by mouth 2 (two) times daily.  60 tablet  3  . losartan (COZAAR) 100 MG tablet Take 1 tablet (100 mg total) by mouth daily.  30 tablet  3  . potassium chloride SA (K-DUR,KLOR-CON) 20 MEQ tablet Take 80 mEq by mouth 2 (two) times daily.      . sildenafil (VIAGRA) 100 MG tablet Take 1 tablet (100 mg total) by mouth  daily as needed for erectile dysfunction.  10 tablet  1  . spironolactone (ALDACTONE) 12.5 mg TABS Take 0.5 tablets (12.5 mg total) by mouth daily.  30 tablet  2  . warfarin (COUMADIN) 4 MG tablet Take 1 tablet (4 mg total) by mouth daily. Take with coumadin 5mg  on 12/25/11 (9 mg total).  30 tablet  1  . warfarin (COUMADIN) 5 MG tablet Take two tablets (10mg  total) on 12/24/11. Then take with coumadin 4mg  (9 mg total) on 12/25/11 until further instructions on Coumadin check 12/26/11.  30 tablet  1  . nitroGLYCERIN (NITROSTAT) 0.4 MG SL tablet Place  0.4 mg under the tongue every 5 (five) minutes as needed.         No current facility-administered medications for this encounter.     PHYSICAL EXAM: Filed Vitals:   03/18/12 0945  BP: 104/68  Pulse: 62   Weight change:  General:  Well appearing. No resp difficulty HEENT: normal Neck: supple. JVP hard to see but does not appear elevated. Carotids 2+ bilaterally; no bruits. No lymphadenopathy or thryomegaly appreciated. Cor: PMI normal. Regular rate & rhythm. No rubs, gallops or murmurs. Lungs: clear Abdomen: obese, soft, nontender, nondistended. No hepatosplenomegaly. No bruits or masses. Good bowel sounds. Extremities: no cyanosis, clubbing, rash, LLE trace(chronic edema) RLE no  edema Neuro: alert & orientedx3, cranial nerves grossly intact. Moves all 4 extremities w/o difficulty. Affect pleasant.    ASSESSMENT & PLAN:

## 2012-03-18 NOTE — Patient Instructions (Addendum)
Follow in 2 months with an ECHO  Take an extra 40 mg of lasix (1/2 tab) if your weight is 318 or greater.   Do the following things EVERYDAY: 1) Weigh yourself in the morning before breakfast. Write it down and keep it in a log. 2) Take your medicines as prescribed 3) Eat low salt foods-Limit salt (sodium) to 2000 mg per day.  4) Stay as active as you can everyday 5) Limit all fluids for the day to less than 2 liters

## 2012-03-21 ENCOUNTER — Other Ambulatory Visit: Payer: Self-pay | Admitting: Family Medicine

## 2012-03-22 NOTE — Telephone Encounter (Signed)
Is this okay to fill? 

## 2012-04-16 ENCOUNTER — Telehealth: Payer: Self-pay

## 2012-04-16 ENCOUNTER — Other Ambulatory Visit: Payer: BC Managed Care – PPO

## 2012-04-16 DIAGNOSIS — Z7902 Long term (current) use of antithrombotics/antiplatelets: Secondary | ICD-10-CM

## 2012-04-16 LAB — PROTIME-INR: INR: 2.87 — ABNORMAL HIGH (ref <1.50)

## 2012-04-16 NOTE — Progress Notes (Signed)
PT WAS ADVISED TO CONTINUE PRESENT DOSE PER JCL PT VERBALIZED UNDERSTANDING

## 2012-04-16 NOTE — Telephone Encounter (Signed)
PT ADVISED TO CONTINUE PRESENT DOSE PER JCL PT VERBALIZED UNDERSTANDING

## 2012-04-18 ENCOUNTER — Other Ambulatory Visit (HOSPITAL_COMMUNITY): Payer: Self-pay | Admitting: Physician Assistant

## 2012-04-19 ENCOUNTER — Other Ambulatory Visit (HOSPITAL_COMMUNITY): Payer: Self-pay | Admitting: Cardiology

## 2012-04-19 ENCOUNTER — Other Ambulatory Visit: Payer: BC Managed Care – PPO

## 2012-04-19 DIAGNOSIS — I5022 Chronic systolic (congestive) heart failure: Secondary | ICD-10-CM

## 2012-04-19 MED ORDER — FUROSEMIDE 80 MG PO TABS: 80.0000 mg | ORAL_TABLET | Freq: Two times a day (BID) | ORAL | Status: DC

## 2012-04-19 NOTE — Telephone Encounter (Signed)
Pt called to request refill

## 2012-04-29 ENCOUNTER — Other Ambulatory Visit: Payer: Self-pay | Admitting: Medical

## 2012-04-29 ENCOUNTER — Other Ambulatory Visit (HOSPITAL_COMMUNITY): Payer: Self-pay | Admitting: Physician Assistant

## 2012-05-10 ENCOUNTER — Telehealth: Payer: Self-pay | Admitting: Internal Medicine

## 2012-05-10 ENCOUNTER — Encounter: Payer: Self-pay | Admitting: Medical

## 2012-05-10 ENCOUNTER — Ambulatory Visit (INDEPENDENT_AMBULATORY_CARE_PROVIDER_SITE_OTHER): Payer: BC Managed Care – PPO | Admitting: Medical

## 2012-05-10 VITALS — BP 102/60 | HR 60 | Temp 98.4°F | Resp 16 | Wt 315.0 lb

## 2012-05-10 DIAGNOSIS — Z8719 Personal history of other diseases of the digestive system: Secondary | ICD-10-CM

## 2012-05-10 DIAGNOSIS — Z7901 Long term (current) use of anticoagulants: Secondary | ICD-10-CM

## 2012-05-10 DIAGNOSIS — R1032 Left lower quadrant pain: Secondary | ICD-10-CM

## 2012-05-10 LAB — CBC WITH DIFFERENTIAL/PLATELET
Basophils Absolute: 0 10*3/uL (ref 0.0–0.1)
Lymphocytes Relative: 13 % (ref 12–46)
Lymphs Abs: 2 10*3/uL (ref 0.7–4.0)
Neutrophils Relative %: 79 % — ABNORMAL HIGH (ref 43–77)
Platelets: 217 10*3/uL (ref 150–400)
RBC: 4.73 MIL/uL (ref 4.22–5.81)
RDW: 13.6 % (ref 11.5–15.5)
WBC: 15.8 10*3/uL — ABNORMAL HIGH (ref 4.0–10.5)

## 2012-05-10 LAB — POCT URINALYSIS DIPSTICK
Glucose, UA: NEGATIVE
Leukocytes, UA: NEGATIVE
Nitrite, UA: NEGATIVE
Protein, UA: NEGATIVE
Urobilinogen, UA: NEGATIVE

## 2012-05-10 LAB — PROTIME-INR: INR: 2.32 — ABNORMAL HIGH (ref <1.50)

## 2012-05-10 LAB — BASIC METABOLIC PANEL
CO2: 27 mEq/L (ref 19–32)
Calcium: 8.7 mg/dL (ref 8.4–10.5)
Chloride: 99 mEq/L (ref 96–112)
Creat: 1.13 mg/dL (ref 0.50–1.35)
Sodium: 137 mEq/L (ref 135–145)

## 2012-05-10 MED ORDER — CIPROFLOXACIN HCL 750 MG PO TABS: 750.0000 mg | ORAL_TABLET | Freq: Two times a day (BID) | ORAL | Status: DC

## 2012-05-10 MED ORDER — ONDANSETRON HCL 4 MG PO TABS: 4.0000 mg | ORAL_TABLET | Freq: Three times a day (TID) | ORAL | Status: DC | PRN

## 2012-05-10 MED ORDER — HYDROCODONE-ACETAMINOPHEN 5-325 MG PO TABS: 1.0000 | ORAL_TABLET | Freq: Four times a day (QID) | ORAL | Status: DC | PRN

## 2012-05-10 MED ORDER — METRONIDAZOLE 500 MG PO TABS: 500.0000 mg | ORAL_TABLET | Freq: Three times a day (TID) | ORAL | Status: DC

## 2012-05-10 NOTE — Progress Notes (Signed)
Subjective: Here for abdominal pain.  He reports hx/o diverticulitis 2 years ago, was hospitalized for LLQ pain and fever.  He thinks he may have diverticulitis again.   He notes few day hx/o LLQ abdominal pain, increased gas, but otherwise no fever, no NVD, no rash, no appetite change, no blood in stool.  He has had some constipation, but otherwise no urinary symptoms, no hx/o kidney stone, no other symptoms . Using nothing for symptoms.  He does have decreased appetite.    Past Medical History  Diagnosis Date  . Mural thrombus of heart     coumadin  . Pulmonary embolism     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  . Paroxysmal SVT (supraventricular tachycardia)   . Morbid obesity   . Arthritis   . Nonischemic cardiomyopathy     a) 12/17/11 echo: LVEF 25-30%, grade 3 diastolic dysfunction (c/w restriction), mod MR, mod LA/LV and mild RA dilatation; b) 12/18/11 cMRI: LVEF 38%, mod LV/mild RV dilatation, global HK, mild-mod RV sys dysfxn, no LV thrombus & patchy non-subendocardial delayed enhancement c/w infil dz or prior myocarditis   . Deep vein thrombosis   . Lupus anticoagulant positive   . Chronic combined systolic and diastolic CHF (congestive heart failure)    ROS as in subjective  Objective: Filed Vitals:   05/10/12 1439  BP: 102/60  Pulse: 60  Temp: 98.4 F (36.9 C)  Resp: 16    General appearance: alert, no distress, WD/WN, obese AA male Oral cavity: MMM, no lesions Neck: supple, no lymphadenopathy, no thyromegaly, no masses Heart: RRR, normal S1, S2, no murmurs Lungs: CTA bilaterally, no wheezes, rhonchi, or rales Abdomen: +decreased bs general, soft, tender LLQ moderately, otherwise, non distended, no masses, no hepatomegaly, no splenomegaly Back: no CVA tenderness Pulses: 2+ symmetric, upper and lower extremities, normal cap refill  Assessment: Encounter Diagnoses  Name Primary?  [name] LLQ abdominal pain Yes  . History of diverticulitis of colon   . Long term (current)  use of anticoagulants     Plan: Labs today stat.  Will treat empirically for diverticulitis with cipro and flagyl, zofran for nausea, advised liquid diet only the next few days, and call if worse or not improving in 2-3 days. Follow-up pending labs, 1wk

## 2012-05-10 NOTE — Patient Instructions (Signed)
Begin both antibiotics, Cipro and Flagyl x 7 days  Use Zofran as needed every 6 hours for nausea and vomiting.  Begin Hydrocodone for pain as needed every 4-6 hours.  Rest, drink plenty of water.  Limit solid food intake for now.  Mainly just drink clear fluids.   If worse in the next few days, call, come in, or go to the emergency department.  Diverticulitis A diverticulum is a small pouch or sac on the colon. Diverticulosis is the presence of these diverticula on the colon. Diverticulitis is the irritation (inflammation) or infection of diverticula. CAUSES  The colon and its diverticula contain bacteria. If food particles block the tiny opening to a diverticulum, the bacteria inside can grow and cause an increase in pressure. This leads to infection and inflammation and is called diverticulitis. SYMPTOMS   Abdominal pain and tenderness. Usually, the pain is located on the left side of your abdomen. However, it could be located elsewhere.  Fever.  Bloating.  Feeling sick to your stomach (nausea).  Throwing up (vomiting).  Abnormal stools. DIAGNOSIS  Your caregiver will take a history and perform a physical exam. Since many things can cause abdominal pain, other tests may be necessary. Tests may include:  Blood tests.  Urine tests.  X-ray of the abdomen.  CT scan of the abdomen. Sometimes, surgery is needed to determine if diverticulitis or other conditions are causing your symptoms. TREATMENT  Most of the time, you can be treated without surgery. Treatment includes:  Resting the bowels by only having liquids for a few days. As you improve, you will need to eat a low-fiber diet.  Intravenous (IV) fluids if you are losing body fluids (dehydrated).  Antibiotic medicines that treat infections may be given.  Pain and nausea medicine, if needed.  Surgery if the inflamed diverticulum has burst. HOME CARE INSTRUCTIONS   Try a clear liquid diet (broth, tea, or water for as  long as directed by your caregiver). You may then gradually begin a low-fiber diet as tolerated. A low-fiber diet is a diet with less than 10 grams of fiber. Choose the foods below to reduce fiber in the diet:  White breads, cereals, rice, and pasta.  Cooked fruits and vegetables or soft fresh fruits and vegetables without the skin.  Ground or well-cooked tender beef, ham, veal, lamb, pork, or poultry.  Eggs and seafood.  After your diverticulitis symptoms have improved, your caregiver may put you on a high-fiber diet. A high-fiber diet includes 14 grams of fiber for every 1000 calories consumed. For a standard 2000 calorie diet, you would need 28 grams of fiber. Follow these diet guidelines to help you increase the fiber in your diet. It is important to slowly increase the amount fiber in your diet to avoid gas, constipation, and bloating.  Choose whole-grain breads, cereals, pasta, and brown rice.  Choose fresh fruits and vegetables with the skin on. Do not overcook vegetables because the more vegetables are cooked, the more fiber is lost.  Choose more nuts, seeds, legumes, dried peas, beans, and lentils.  Look for food products that have greater than 3 grams of fiber per serving on the Nutrition Facts label.  Take all medicine as directed by your caregiver.  If your caregiver has given you a follow-up appointment, it is very important that you go. Not going could result in lasting (chronic) or permanent injury, pain, and disability. If there is any problem keeping the appointment, call to reschedule. SEEK MEDICAL CARE IF:  Your pain does not improve.  You have a hard time advancing your diet beyond clear liquids.  Your bowel movements do not return to normal. SEEK IMMEDIATE MEDICAL CARE IF:   Your pain becomes worse.  You have an oral temperature above 102 F (38.9 C), not controlled by medicine.  You have repeated vomiting.  You have bloody or black, tarry  stools.  Symptoms that brought you to your caregiver become worse or are not getting better. MAKE SURE YOU:   Understand these instructions.  Will watch your condition.  Will get help right away if you are not doing well or get worse. Document Released: 10/09/2004 Document Revised: 03/24/2011 Document Reviewed: 02/04/2010 [facility] Patient Information 2013 [place], [place].

## 2012-05-10 NOTE — Telephone Encounter (Signed)
Pt is seeing shane today since Dr. Susann Givens does not have anything else left for the day. And pt states he gets paid on Wednesday and will pay his copay then

## 2012-05-10 NOTE — Telephone Encounter (Signed)
Pt called and states that he has had a flare up in his diverticulitis and is having some pain and it has been flared up for 2 or 3 days and i have offered him an appointment but he states he doesn't have the money for a copay to come in so he just wanted to know what he could do for the pain

## 2012-05-10 NOTE — Telephone Encounter (Signed)
If he thinks he is having diverticulitis he needs to come in. Tell him we will work with him on the co-pay

## 2012-05-20 ENCOUNTER — Other Ambulatory Visit (HOSPITAL_COMMUNITY): Payer: Self-pay | Admitting: Internal Medicine

## 2012-05-20 ENCOUNTER — Encounter (HOSPITAL_COMMUNITY): Payer: Self-pay

## 2012-05-20 ENCOUNTER — Ambulatory Visit (HOSPITAL_BASED_OUTPATIENT_CLINIC_OR_DEPARTMENT_OTHER)
Admission: RE | Admit: 2012-05-20 | Discharge: 2012-05-20 | Disposition: A | Discharge status: Home / Self Care | Payer: BC Managed Care – PPO | Source: Ambulatory Visit | Attending: Internal Medicine | Admitting: Internal Medicine

## 2012-05-20 ENCOUNTER — Ambulatory Visit (HOSPITAL_COMMUNITY)
Admission: RE | Admit: 2012-05-20 | Discharge: 2012-05-20 | Disposition: A | Discharge status: Home / Self Care | Payer: BC Managed Care – PPO | Source: Ambulatory Visit | Attending: Family Medicine | Admitting: Family Medicine

## 2012-05-20 ENCOUNTER — Ambulatory Visit: Payer: Self-pay | Admitting: Family Medicine

## 2012-05-20 VITALS — BP 112/62 | HR 64 | Resp 20 | Ht 68.0 in | Wt 322.8 lb

## 2012-05-20 DIAGNOSIS — I471 Supraventricular tachycardia, unspecified: Secondary | ICD-10-CM | POA: Insufficient documentation

## 2012-05-20 DIAGNOSIS — I428 Other cardiomyopathies: Secondary | ICD-10-CM | POA: Insufficient documentation

## 2012-05-20 DIAGNOSIS — Z86711 Personal history of pulmonary embolism: Secondary | ICD-10-CM | POA: Insufficient documentation

## 2012-05-20 DIAGNOSIS — I509 Heart failure, unspecified: Secondary | ICD-10-CM

## 2012-05-20 DIAGNOSIS — Z6841 Body Mass Index (BMI) 40.0 and over, adult: Secondary | ICD-10-CM | POA: Insufficient documentation

## 2012-05-20 DIAGNOSIS — Z79899 Other long term (current) drug therapy: Secondary | ICD-10-CM | POA: Insufficient documentation

## 2012-05-20 DIAGNOSIS — I5022 Chronic systolic (congestive) heart failure: Secondary | ICD-10-CM

## 2012-05-20 DIAGNOSIS — I5189 Other ill-defined heart diseases: Secondary | ICD-10-CM | POA: Insufficient documentation

## 2012-05-20 DIAGNOSIS — Z7901 Long term (current) use of anticoagulants: Secondary | ICD-10-CM | POA: Insufficient documentation

## 2012-05-20 DIAGNOSIS — Z86718 Personal history of other venous thrombosis and embolism: Secondary | ICD-10-CM | POA: Insufficient documentation

## 2012-05-20 DIAGNOSIS — I517 Cardiomegaly: Secondary | ICD-10-CM

## 2012-05-20 DIAGNOSIS — R894 Abnormal immunological findings in specimens from other organs, systems and tissues: Secondary | ICD-10-CM | POA: Insufficient documentation

## 2012-05-20 MED ORDER — SPIRONOLACTONE 25 MG PO TABS: 25.0000 mg | ORAL_TABLET | Freq: Every day | ORAL | Status: DC

## 2012-05-20 NOTE — Patient Instructions (Addendum)
Increase spironolactone 1 tab daily  Follow up 3 months.

## 2012-05-20 NOTE — Assessment & Plan Note (Signed)
Encouraged him to continue to work out and work on diet.

## 2012-05-20 NOTE — Assessment & Plan Note (Addendum)
Have reviewed echo from today, EF improving to 40-45%.  There is no indication for ICD at this time.  He does have mild fluid on board.  Will increase spiro 25 mg daily.  Can not titrate carvedilol due to bradycardia.  Will hold off on hydralazine/nitrates due to viagra use.  Have discussed low sodium diet and fluid restrictions.  Have also discussed sliding scale lasix.    Patient seen and examined with Ulyess Blossom, PA-C. We discussed all aspects of the encounter. I agree with the assessment and plan as stated above. He continues to improve. NYHA II. Mild volume overload. Echo images reviewed personally and compared to previous echoes. EF continues to improve. Agree with titrating spiro. Reinforced need for daily weights and reviewed use of sliding scale diuretics. With EF > 35% no role for ICD.

## 2012-05-20 NOTE — Progress Notes (Signed)
PCP: Dr Jacqulyn Bath Cardiologist: Dr [name] EP: Dr Johney Frame  HPI: Roberto [name] is a 47 yo AA male with h/o systolic HF due to NICM (EF25-30% 2013 ), h/o recurrent DVT with resultant PE and chronic venous insufficiency, h/o LV thrombus (on Coumadin), PSVT (elected for medical therapy over RFA on EP follow-up) and morbid obesity.   Admitted to [facility] 12/16/11 for recurrent HF. Diuresed 20 pounds with IV lasix. See RHC/LHC results   12/17/11 ECHO 25-30% Diffuse hypokinesis. Doppler parameters are consistent with a reversible restrictive pattern, indicative of decreased left ventricular diastolic compliance and/or increased left atrial pressure (grade 3 diastolic dysfunction).  CARDIAC MRI - 12/18/11  1. Moderately dilated left ventricle with moderately decreased systolic function, EF 38%. Global hypokinesis.  2. Mildly dilated right ventricle with mild to moderately decreased systolic function.  3. No definite LV thrombus noted.  4. Patchy non-subendocardial delayed enhancement seen in the ventricular septum (see above for description). This is not suggestive of a coronary disease pattern. This could be suggestive of infiltrative disease versus prior myocarditis.  RHC/LHC 12/22/11  RA = 5  RV = 37/1/5  PA = 37/12 (23)  PCW = 16  Fick cardiac output/index = 6.0/2.4  PVR = 1.2 [place]  FA sat = 88%  PA sat = 59%, 61%  Ao Pressure: 94/62 (77)  LV Pressure: 99/2/12  Cors: Normal  He returns for follow up today.  He feels well.  He continues to exercise on the treadmill or elliptical about 45 min, 5 days a week.  He denies orthopnea, PND or dyspnea.  He has left leg venous stasis but denies right leg edema.  No chest pain.  Says he is following low sodium diet.   Had sleep apnea test   Echo Today: EF 40-45%  Lives with his wife and 5 children.   ROS: All systems negative except as listed in HPI, PMH and Problem List.  Past Medical History  Diagnosis Date  . Mural thrombus of heart     coumadin  .  Pulmonary embolism     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  . Paroxysmal SVT (supraventricular tachycardia)   . Morbid obesity   . Arthritis   . Nonischemic cardiomyopathy     a) 12/17/11 echo: LVEF 25-30%, grade 3 diastolic dysfunction (c/w restriction), mod MR, mod LA/LV and mild RA dilatation; b) 12/18/11 cMRI: LVEF 38%, mod LV/mild RV dilatation, global HK, mild-mod RV sys dysfxn, no LV thrombus & patchy non-subendocardial delayed enhancement c/w infil dz or prior myocarditis   . Deep vein thrombosis   . Lupus anticoagulant positive   . Chronic combined systolic and diastolic CHF (congestive heart failure)     Current Outpatient Prescriptions  Medication Sig Dispense Refill  . carvedilol (COREG) 12.5 MG tablet Take 1.5 tablets (18.75 mg total) by mouth 2 (two) times daily with a meal.  90 tablet  5  . ciprofloxacin (CIPRO) 750 MG tablet Take 1 tablet (750 mg total) by mouth 2 (two) times daily.  14 tablet  0  . digoxin (LANOXIN) 0.125 MG tablet Take 1 tablet (0.125 mg total) by mouth daily.  30 tablet  3  . furosemide (LASIX) 80 MG tablet Take 1 tablet (80 mg total) by mouth 2 (two) times daily.  60 tablet  3  . KLOR-CON M20 20 MEQ tablet TAKE ONE TABLET BY MOUTH TWICE DAILY  60 tablet  3  . losartan (COZAAR) 100 MG tablet Take 1 tablet (100 mg total)  by mouth daily.  30 tablet  3  . metroNIDAZOLE (FLAGYL) 500 MG tablet Take 1 tablet (500 mg total) by mouth 3 (three) times daily.  21 tablet  0  . sildenafil (VIAGRA) 100 MG tablet Take 1 tablet (100 mg total) by mouth daily as needed for erectile dysfunction.  10 tablet  1  . spironolactone (ALDACTONE) 12.5 mg TABS Take 0.5 tablets (12.5 mg total) by mouth daily.  30 tablet  2  . warfarin (COUMADIN) 4 MG tablet TAKE ONE TABLET BY MOUTH EVERY DAY WITH  5  MG  TAB  FOR  A  TOTAL  OF  9MG /DAY  30 tablet  0  . warfarin (COUMADIN) 5 MG tablet TAKE ONE TABLET BY MOUTH EVERY DAY WITH  4  MG  FOR  A  TOTAL  OF  9  MG/DAY  30 tablet  0  .  HYDROcodone-acetaminophen (NORCO/VICODIN) 5-325 MG per tablet Take 1 tablet by mouth every 6 (six) hours as needed for pain.  20 tablet  0  . nitroGLYCERIN (NITROSTAT) 0.4 MG SL tablet Place 0.4 mg under the tongue every 5 (five) minutes as needed.        . ondansetron (ZOFRAN) 4 MG tablet Take 1 tablet (4 mg total) by mouth every 8 (eight) hours as needed for nausea.  20 tablet  0   No current facility-administered medications for this encounter.     PHYSICAL EXAM: Filed Vitals:   05/20/12 1029  BP: 112/62  Pulse: 64  Resp: 20  Height: 5\' 8"  (1.727 m)  Weight: 322 lb 12.8 oz (146.421 kg)  SpO2: 99%    General:  Well appearing. No resp difficulty HEENT: normal Neck: supple. JVP hard to see but does not appear elevated. Carotids 2+ bilaterally; no bruits. No lymphadenopathy or thryomegaly appreciated. Cor: PMI normal. Regular rate & rhythm. No rubs, gallops or murmurs. Lungs: clear Abdomen: obese, soft, nontender, nondistended. No hepatosplenomegaly. No bruits or masses. Good bowel sounds. Extremities: no cyanosis, clubbing, rash, LLE trace(chronic venous stasis) RLE trace edema. Neuro: alert & orientedx3, cranial nerves grossly intact. Moves all 4 extremities w/o difficulty. Affect pleasant.    ASSESSMENT & PLAN:

## 2012-05-20 NOTE — Progress Notes (Signed)
  Echocardiogram 2D Echocardiogram has been performed.  Roberto [name] 05/20/2012, 11:17 AM

## 2012-06-12 ENCOUNTER — Other Ambulatory Visit: Payer: Self-pay | Admitting: Family Medicine

## 2012-07-05 ENCOUNTER — Other Ambulatory Visit (HOSPITAL_COMMUNITY): Payer: Self-pay | Admitting: Adult Health

## 2012-07-09 ENCOUNTER — Telehealth: Payer: Self-pay | Admitting: Family Medicine

## 2012-07-09 NOTE — Telephone Encounter (Signed)
Message copied by Janeice Robinson on Fri Jul 09, 2012  3:21 PM ------      Message from: Jac Canavan      Created: Fri Jul 09, 2012  8:02 AM       I assume cardiology is monitoring his INRs and digoxin level?  pls verify.  Coumadin/INR levels should be checked at minimum monthly.  Digoxin levels periodically, but since he is followed by cardiology, I assume they are checking. ------

## 2012-07-09 NOTE — Telephone Encounter (Signed)
Patient states the last time cardiology check his INR was May 8 th. He said that he will need to come here now for that so he is coming on Monday to get his INR check. He said Cardiology will check his digoxin levels. CLS

## 2012-07-12 ENCOUNTER — Other Ambulatory Visit: Payer: Self-pay

## 2012-07-14 ENCOUNTER — Telehealth: Payer: Self-pay | Admitting: Family Medicine

## 2012-07-14 MED ORDER — WARFARIN SODIUM 4 MG PO TABS: ORAL_TABLET | ORAL | Status: DC

## 2012-07-14 MED ORDER — WARFARIN SODIUM 5 MG PO TABS: ORAL_TABLET | ORAL | Status: DC

## 2012-07-14 NOTE — Telephone Encounter (Signed)
Coumadin renewed 

## 2012-08-09 ENCOUNTER — Other Ambulatory Visit: Payer: Self-pay

## 2012-08-09 ENCOUNTER — Other Ambulatory Visit: Payer: BC Managed Care – PPO

## 2012-08-09 DIAGNOSIS — Z7901 Long term (current) use of anticoagulants: Secondary | ICD-10-CM

## 2012-08-09 MED ORDER — WARFARIN SODIUM 5 MG PO TABS: ORAL_TABLET | ORAL | Status: DC

## 2012-08-09 MED ORDER — WARFARIN SODIUM 4 MG PO TABS: ORAL_TABLET | ORAL | Status: DC

## 2012-08-09 NOTE — Telephone Encounter (Signed)
SENT IN COUM.

## 2012-08-09 NOTE — Progress Notes (Signed)
Quick Note:  PT ADVISED AND MED SENT IN ______

## 2012-08-10 ENCOUNTER — Other Ambulatory Visit (HOSPITAL_COMMUNITY): Payer: Self-pay | Admitting: *Deleted

## 2012-08-10 MED ORDER — LOSARTAN POTASSIUM 100 MG PO TABS: ORAL_TABLET | ORAL | Status: DC

## 2012-08-16 ENCOUNTER — Other Ambulatory Visit (HOSPITAL_COMMUNITY): Payer: Self-pay | Admitting: *Deleted

## 2012-08-16 DIAGNOSIS — I5022 Chronic systolic (congestive) heart failure: Secondary | ICD-10-CM

## 2012-08-16 MED ORDER — FUROSEMIDE 80 MG PO TABS: 80.0000 mg | ORAL_TABLET | Freq: Two times a day (BID) | ORAL | Status: DC

## 2012-08-16 MED ORDER — DIGOXIN 125 MCG PO TABS: 0.1250 mg | ORAL_TABLET | Freq: Every day | ORAL | Status: DC

## 2012-09-16 ENCOUNTER — Telehealth: Payer: Self-pay

## 2012-09-16 ENCOUNTER — Encounter (HOSPITAL_COMMUNITY): Payer: Self-pay

## 2012-09-16 ENCOUNTER — Ambulatory Visit (HOSPITAL_COMMUNITY)
Admission: RE | Admit: 2012-09-16 | Discharge: 2012-09-16 | Disposition: A | Discharge status: Home / Self Care | Payer: BC Managed Care – PPO | Source: Ambulatory Visit | Attending: Internal Medicine | Admitting: Internal Medicine

## 2012-09-16 VITALS — BP 98/62 | HR 76 | Ht 68.0 in | Wt 333.8 lb

## 2012-09-16 DIAGNOSIS — R894 Abnormal immunological findings in specimens from other organs, systems and tissues: Secondary | ICD-10-CM | POA: Insufficient documentation

## 2012-09-16 DIAGNOSIS — Z7901 Long term (current) use of anticoagulants: Secondary | ICD-10-CM | POA: Insufficient documentation

## 2012-09-16 DIAGNOSIS — I428 Other cardiomyopathies: Secondary | ICD-10-CM | POA: Insufficient documentation

## 2012-09-16 DIAGNOSIS — Z79899 Other long term (current) drug therapy: Secondary | ICD-10-CM | POA: Insufficient documentation

## 2012-09-16 DIAGNOSIS — I5189 Other ill-defined heart diseases: Secondary | ICD-10-CM | POA: Insufficient documentation

## 2012-09-16 DIAGNOSIS — Z86718 Personal history of other venous thrombosis and embolism: Secondary | ICD-10-CM | POA: Insufficient documentation

## 2012-09-16 DIAGNOSIS — Z86711 Personal history of pulmonary embolism: Secondary | ICD-10-CM | POA: Insufficient documentation

## 2012-09-16 DIAGNOSIS — I5022 Chronic systolic (congestive) heart failure: Secondary | ICD-10-CM

## 2012-09-16 DIAGNOSIS — M129 Arthropathy, unspecified: Secondary | ICD-10-CM | POA: Insufficient documentation

## 2012-09-16 LAB — BASIC METABOLIC PANEL
BUN: 13 mg/dL (ref 6–23)
Calcium: 8.9 mg/dL (ref 8.4–10.5)
Creatinine, Ser: 0.99 mg/dL (ref 0.50–1.35)
GFR calc Af Amer: >90 mL/min (ref >90)
GFR calc non Af Amer: >90 mL/min (ref >90)
Glucose, Bld: 119 mg/dL — ABNORMAL HIGH (ref 70–99)

## 2012-09-16 MED ORDER — POTASSIUM CHLORIDE CRYS ER 20 MEQ PO TBCR: EXTENDED_RELEASE_TABLET | ORAL | Status: DC

## 2012-09-16 MED ORDER — DIGOXIN 125 MCG PO TABS: 0.1250 mg | ORAL_TABLET | Freq: Every day | ORAL | Status: DC

## 2012-09-16 MED ORDER — FUROSEMIDE 80 MG PO TABS: 80.0000 mg | ORAL_TABLET | Freq: Two times a day (BID) | ORAL | Status: DC

## 2012-09-16 MED ORDER — SPIRONOLACTONE 25 MG PO TABS: 25.0000 mg | ORAL_TABLET | Freq: Every day | ORAL | Status: DC

## 2012-09-16 NOTE — Progress Notes (Signed)
Patient ID: Roberto [name], male   DOB: May 13, 1965, 47 y.o.   MRN: [id number] PCP: Dr Jacqulyn Bath Cardiologist: Dr [name] EP: Dr Johney [name] Coumadin followed by PCP  HPI: Roberto [name] is a 47yo AA male with h/o systolic HF due to NICM (EF25-30% 2013 ), h/o recurrent DVT with resultant PE and chronic venous insufficiency, h/o LV thrombus (on Coumadin), PSVT (elected for medical therapy over RFA on EP follow-up) and morbid obesity.   Admitted to [facility] 12/16/11 for recurrent HF. Diuresed 20 pounds with IV lasix. See RHC/LHC results   12/17/11 ECHO 25-30% Diffuse hypokinesis. Doppler parameters are consistent with a reversible restrictive pattern, indicative of decreased left ventricular diastolic compliance and/or increased left atrial pressure (grade 3 diastolic dysfunction).  CARDIAC MRI - 12/18/11  1. Moderately dilated left ventricle with moderately decreased systolic function, EF 38%. Global hypokinesis.  2. Mildly dilated right ventricle with mild to moderately decreased systolic function.  3. No definite LV thrombus noted.  4. Patchy non-subendocardial delayed enhancement seen in the ventricular septum (see above for description). This is not suggestive of a coronary disease pattern. This could be suggestive of infiltrative disease versus prior myocarditis.  RHC/LHC 12/22/11  Cors: Normal  05/20/2012 ECHO EF 40-45%  He returns for follow up today.  Denies SOB/PND/Orthopnea. Denies dyspnea with inclines surfaces.  Exercising 4-5 times a week with stationary bike, treadmill, and elliptical. Ongoing LLE edema. Weight 330 pounds. Following low salt diet and limits fluid intake intake. Compliant with medications. He has INR checked at Dr. Heron [name] office.    Lives with his wife and 5 children.   ROS: All systems negative except as listed in HPI, PMH and Problem List.  Past Medical History  Diagnosis Date  . Mural thrombus of heart     coumadin  . Pulmonary embolism     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   . Paroxysmal SVT (supraventricular tachycardia)   . Morbid obesity   . Arthritis   . Nonischemic cardiomyopathy     a) 12/17/11 echo: LVEF 25-30%, grade 3 diastolic dysfunction (c/w restriction), mod MR, mod LA/LV and mild RA dilatation; b) 12/18/11 cMRI: LVEF 38%, mod LV/mild RV dilatation, global HK, mild-mod RV sys dysfxn, no LV thrombus & patchy non-subendocardial delayed enhancement c/w infil dz or prior myocarditis   . Deep vein thrombosis   . Lupus anticoagulant positive   . Chronic combined systolic and diastolic CHF (congestive heart failure)     Current Outpatient Prescriptions  Medication Sig Dispense Refill  . carvedilol (COREG) 12.5 MG tablet Take 1.5 tablets (18.75 mg total) by mouth 2 (two) times daily with a meal.  90 tablet  5  . digoxin (LANOXIN) 0.125 MG tablet Take 1 tablet (0.125 mg total) by mouth daily.  30 tablet  3  . furosemide (LASIX) 80 MG tablet Take 1 tablet (80 mg total) by mouth 2 (two) times daily.  60 tablet  3  . KLOR-CON M20 20 MEQ tablet TAKE ONE TABLET BY MOUTH TWICE DAILY  60 tablet  3  . losartan (COZAAR) 100 MG tablet TAKE ONE TABLET BY MOUTH EVERY DAY  30 tablet  3  . sildenafil (VIAGRA) 100 MG tablet Take 1 tablet (100 mg total) by mouth daily as needed for erectile dysfunction.  10 tablet  1  . spironolactone (ALDACTONE) 25 MG tablet Take 1 tablet (25 mg total) by mouth daily.  30 tablet  2  . warfarin (COUMADIN) 4 MG tablet TAKE ONE TABLET BY  MOUTH ONCE DAILY WITH 5 MG TABLET FOR A TOTAL OF 9MG  PER DAY  30 tablet  2  . warfarin (COUMADIN) 5 MG tablet TAKE ONE TABLET BY MOUTH ONCE DAILY WITH 4MG  TABLET FOR A TOTAL OF 9 MG PER DAY  30 tablet  2   No current facility-administered medications for this encounter.     PHYSICAL EXAM: Filed Vitals:   09/16/12 0909  BP: 98/62  Pulse: 76  Height: 5\' 8"  (1.727 m)  Weight: 333 lb 12.8 oz (151.411 kg)  SpO2: 96%    General:  Well appearing. No resp difficulty HEENT: normal Neck: supple. JVP hard  to see but does not appear elevated. Carotids 2+ bilaterally; no bruits. No lymphadenopathy or thryomegaly appreciated. Cor: PMI normal. Regular rate & rhythm. No rubs, gallops or murmurs. Lungs: clear Abdomen: obese, soft, nontender, nondistended. No hepatosplenomegaly. No bruits or masses. Good bowel sounds. Extremities: no cyanosis, clubbing, rash, LLE trace(chronic venous stasis) no RLE edema. Neuro: alert & orientedx3, cranial nerves grossly intact. Moves all 4 extremities w/o difficulty. Affect pleasant.    ASSESSMENT & PLAN:  1. Chronic Systolic Heart Failure ECHO EF 40-45% NYHA II. Volume status stable. Continue lasix 80 mg twice a day and 25 mg spironolactone daily. Continue carvedilol 18.75 mg twice a day. - BP too low to titrate further On goal dose of losartan 100 mg daily Reinforced daily weights   Check BMET   2. DVT/PE Continue coumadin. INR per Dr Jacqulyn Bath  3. Morbid Obestiy  Provided number for bariatric surgical options [phone]  Follow up in 3-4 months  Roberto Clegg, NP-C  Patient seen and examined with Roberto Becket, NP. We discussed all aspects of the encounter. I agree with the assessment and plan as stated above. He is doing quite well. EF now only mild to moderately depressed. Continue current regimen. Agree with weight loss efforts.  Roberto Bensimhon,MD 11:50 PM

## 2012-09-16 NOTE — Patient Instructions (Addendum)
Contact [phone] for gastric bypass class  Follow up in  3-4 months  Do the following things EVERYDAY: 1) Weigh yourself in the morning before breakfast. Write it down and keep it in a log. 2) Take your medicines as prescribed 3) Eat low salt foods-Limit salt (sodium) to 2000 mg per day.  4) Stay as active as you can everyday 5) Limit all fluids for the day to less than 2 liters

## 2012-09-16 NOTE — Telephone Encounter (Signed)
Refill sent in

## 2012-09-28 ENCOUNTER — Ambulatory Visit (INDEPENDENT_AMBULATORY_CARE_PROVIDER_SITE_OTHER): Payer: BC Managed Care – PPO | Admitting: Family Medicine

## 2012-09-28 VITALS — BP 110/78 | HR 72 | Wt 324.0 lb

## 2012-09-28 DIAGNOSIS — Z23 Encounter for immunization: Secondary | ICD-10-CM

## 2012-09-28 DIAGNOSIS — Z86718 Personal history of other venous thrombosis and embolism: Secondary | ICD-10-CM

## 2012-09-28 DIAGNOSIS — Z7901 Long term (current) use of anticoagulants: Secondary | ICD-10-CM

## 2012-09-28 DIAGNOSIS — J029 Acute pharyngitis, unspecified: Secondary | ICD-10-CM

## 2012-09-28 LAB — PROTIME-INR: INR: 2.5 — ABNORMAL HIGH (ref <1.50)

## 2012-09-28 NOTE — Progress Notes (Signed)
  Subjective:    Patient ID: Roberto [name], male    DOB: 1965-09-09, 47 y.o.   MRN: [id number]  HPI Today he noted a stiff neck in the morning but no fever, chills or other symptoms. Today he woke up with the neck bothering him less however he is having a slight sore throat again with no other symptoms. He also needs followup on his Coumadin. He did talk to his cardiologist concerning weight loss surgery. He has had very little luck with weight loss on his own. His most recent attempt was to have someone do the cooking which unfortunately did not last for a long. He apparently did lose 20 pounds but gained it back.   Review of Systems     Objective:   Physical Exam alert and in no distress. Tympanic membranes and canals are normal. Throat is clear. Tonsils are normal. Neck is supple without adenopathy or thyromegaly. Cardiac exam shows a regular sinus rhythm without murmurs or gallops. Lungs are clear to auscultation. Strep test is negative.       Assessment & Plan:  Need for prophylactic vaccination and inoculation against influenza - Plan: Flu Vaccine QUAD 36+ mos IM  Long term (current) use of anticoagulants - Plan: Protime-INR  Sore throat - Plan: Rapid Strep A  OBESITY, MORBID  History of venous thromboembolism  flu shot given with risks and benefits discussed. Also discussed his weight loss. Discussed the fact that the weight loss surgery forces him to make lifestyle changes and that if he chose he could literally eat his weight to this and gain weight back. Strongly encouraged him to look further into making permanent lifestyle change.

## 2012-10-06 ENCOUNTER — Telehealth: Payer: Self-pay | Admitting: Family Medicine

## 2012-10-06 ENCOUNTER — Other Ambulatory Visit (HOSPITAL_COMMUNITY): Payer: Self-pay | Admitting: Internal Medicine

## 2012-10-06 MED ORDER — WARFARIN SODIUM 4 MG PO TABS: ORAL_TABLET | ORAL | Status: DC

## 2012-10-06 MED ORDER — WARFARIN SODIUM 5 MG PO TABS: ORAL_TABLET | ORAL | Status: DC

## 2012-10-06 NOTE — Telephone Encounter (Signed)
Warfarin called in

## 2013-02-03 ENCOUNTER — Other Ambulatory Visit: Payer: Self-pay | Admitting: Family Medicine

## 2013-02-07 ENCOUNTER — Other Ambulatory Visit (HOSPITAL_COMMUNITY): Payer: Self-pay | Admitting: Cardiology

## 2013-02-07 DIAGNOSIS — I509 Heart failure, unspecified: Secondary | ICD-10-CM

## 2013-02-07 MED ORDER — CARVEDILOL 12.5 MG PO TABS: 12.5000 mg | ORAL_TABLET | Freq: Two times a day (BID) | ORAL | Status: DC

## 2013-02-10 ENCOUNTER — Other Ambulatory Visit (HOSPITAL_COMMUNITY): Payer: Self-pay | Admitting: Internal Medicine

## 2013-02-10 ENCOUNTER — Other Ambulatory Visit (HOSPITAL_COMMUNITY): Payer: Self-pay | Admitting: Cardiology

## 2013-02-10 DIAGNOSIS — I5022 Chronic systolic (congestive) heart failure: Secondary | ICD-10-CM

## 2013-02-10 MED ORDER — LOSARTAN POTASSIUM 100 MG PO TABS: ORAL_TABLET | ORAL | Status: DC

## 2013-02-15 ENCOUNTER — Encounter (HOSPITAL_COMMUNITY): Payer: Self-pay | Admitting: Cardiology

## 2013-02-15 ENCOUNTER — Telehealth (HOSPITAL_COMMUNITY): Payer: Self-pay | Admitting: Cardiology

## 2013-02-15 NOTE — Telephone Encounter (Signed)
Attempting to contact pt regarding 3-4 month follow up I have been unable to reach this patient by phone.  A letter is being sent to the last known home address.

## 2013-04-12 ENCOUNTER — Other Ambulatory Visit: Payer: Self-pay | Admitting: Family Medicine

## 2013-04-12 ENCOUNTER — Other Ambulatory Visit (HOSPITAL_COMMUNITY): Payer: Self-pay | Admitting: Cardiology

## 2013-04-12 DIAGNOSIS — I509 Heart failure, unspecified: Secondary | ICD-10-CM

## 2013-04-12 MED ORDER — SPIRONOLACTONE 25 MG PO TABS: 25.0000 mg | ORAL_TABLET | Freq: Every day | ORAL | Status: DC

## 2013-04-12 NOTE — Telephone Encounter (Signed)
Is this ok to refill?  

## 2013-04-12 NOTE — Telephone Encounter (Signed)
Tell him he needs to come in for a PT/INR

## 2013-04-18 ENCOUNTER — Other Ambulatory Visit: Payer: Self-pay | Admitting: Medical

## 2013-04-18 ENCOUNTER — Other Ambulatory Visit: Payer: BC Managed Care – PPO

## 2013-04-18 ENCOUNTER — Other Ambulatory Visit: Payer: Self-pay | Admitting: Family Medicine

## 2013-04-18 DIAGNOSIS — Z79899 Other long term (current) drug therapy: Secondary | ICD-10-CM

## 2013-04-18 LAB — PROTIME-INR
INR: 2.2 — ABNORMAL HIGH (ref <1.50)
Prothrombin Time: 23.5 seconds — ABNORMAL HIGH (ref 11.6–15.2)

## 2013-04-18 MED ORDER — WARFARIN SODIUM 5 MG PO TABS: ORAL_TABLET | ORAL | Status: DC

## 2013-04-18 MED ORDER — WARFARIN SODIUM 4 MG PO TABS: ORAL_TABLET | ORAL | Status: DC

## 2013-04-18 MED ORDER — WARFARIN SODIUM 1 MG PO TABS: 1.0000 mg | ORAL_TABLET | Freq: Every day | ORAL | Status: DC

## 2013-04-21 ENCOUNTER — Encounter: Payer: BC Managed Care – PPO | Admitting: Medical

## 2013-04-21 ENCOUNTER — Telehealth: Payer: Self-pay | Admitting: Family Medicine

## 2013-04-21 NOTE — Telephone Encounter (Signed)
PT had a visit this morning and did not show up.  Pt came in on Tuesday as he was out of Coumadin and needed his levels checked, I explained we would only do that if he came in for his visit Thursday which is today.  Pt no showed.  I called pt home, cell and work and left message for him to call me as we will not be able to continue to care for him otherwise.

## 2013-04-29 ENCOUNTER — Ambulatory Visit (INDEPENDENT_AMBULATORY_CARE_PROVIDER_SITE_OTHER): Payer: BC Managed Care – PPO | Admitting: Medical

## 2013-04-29 ENCOUNTER — Encounter: Payer: Self-pay | Admitting: Medical

## 2013-04-29 VITALS — BP 116/72 | HR 64 | Temp 98.1°F | Resp 20 | Wt 330.0 lb

## 2013-04-29 DIAGNOSIS — Z86711 Personal history of pulmonary embolism: Secondary | ICD-10-CM

## 2013-04-29 DIAGNOSIS — Z86718 Personal history of other venous thrombosis and embolism: Secondary | ICD-10-CM

## 2013-04-29 DIAGNOSIS — Z91199 Patient's noncompliance with other medical treatment and regimen due to unspecified reason: Secondary | ICD-10-CM

## 2013-04-29 DIAGNOSIS — I509 Heart failure, unspecified: Secondary | ICD-10-CM

## 2013-04-29 DIAGNOSIS — Z7901 Long term (current) use of anticoagulants: Secondary | ICD-10-CM

## 2013-04-29 DIAGNOSIS — I5022 Chronic systolic (congestive) heart failure: Secondary | ICD-10-CM

## 2013-04-29 DIAGNOSIS — E669 Obesity, unspecified: Secondary | ICD-10-CM

## 2013-04-29 DIAGNOSIS — N529 Male erectile dysfunction, unspecified: Secondary | ICD-10-CM

## 2013-04-29 DIAGNOSIS — Z9119 Patient's noncompliance with other medical treatment and regimen: Secondary | ICD-10-CM

## 2013-04-29 DIAGNOSIS — R209 Unspecified disturbances of skin sensation: Secondary | ICD-10-CM

## 2013-04-29 DIAGNOSIS — R202 Paresthesia of skin: Secondary | ICD-10-CM

## 2013-04-29 LAB — COMPREHENSIVE METABOLIC PANEL
ALT: 15 U/L (ref 0–53)
AST: 20 U/L (ref 0–37)
Albumin: 3.8 g/dL (ref 3.5–5.2)
Alkaline Phosphatase: 41 U/L (ref 39–117)
BUN: 14 mg/dL (ref 6–23)
CALCIUM: 8.9 mg/dL (ref 8.4–10.5)
CHLORIDE: 98 meq/L (ref 96–112)
CO2: 28 meq/L (ref 19–32)
CREATININE: 1.05 mg/dL (ref 0.50–1.35)
Glucose, Bld: 101 mg/dL — ABNORMAL HIGH (ref 70–99)
Potassium: 4.1 mEq/L (ref 3.5–5.3)
SODIUM: 137 meq/L (ref 135–145)
Total Bilirubin: 0.5 mg/dL (ref 0.2–1.2)
Total Protein: 7.2 g/dL (ref 6.0–8.3)

## 2013-04-29 LAB — CBC WITH DIFFERENTIAL/PLATELET
Basophils Absolute: 0.1 10*3/uL (ref 0.0–0.1)
Basophils Relative: 1 % (ref 0–1)
EOS ABS: 0.2 10*3/uL (ref 0.0–0.7)
Eosinophils Relative: 2 % (ref 0–5)
HEMATOCRIT: 41.3 % (ref 39.0–52.0)
HEMOGLOBIN: 14 g/dL (ref 13.0–17.0)
LYMPHS ABS: 2 10*3/uL (ref 0.7–4.0)
Lymphocytes Relative: 23 % (ref 12–46)
MCH: 29 pg (ref 26.0–34.0)
MCHC: 33.9 g/dL (ref 30.0–36.0)
MCV: 85.7 fL (ref 78.0–100.0)
MONOS PCT: 9 % (ref 3–12)
Monocytes Absolute: 0.8 10*3/uL (ref 0.1–1.0)
Neutro Abs: 5.7 10*3/uL (ref 1.7–7.7)
Neutrophils Relative %: 65 % (ref 43–77)
Platelets: 217 10*3/uL (ref 150–400)
RBC: 4.82 MIL/uL (ref 4.22–5.81)
RDW: 13.9 % (ref 11.5–15.5)
WBC: 8.7 10*3/uL (ref 4.0–10.5)

## 2013-04-29 LAB — LIPID PANEL
CHOLESTEROL: 112 mg/dL (ref 0–200)
HDL: 32 mg/dL — ABNORMAL LOW (ref >39)
LDL Cholesterol: 67 mg/dL (ref 0–99)
Total CHOL/HDL Ratio: 3.5 Ratio
Triglycerides: 65 mg/dL (ref <150)
VLDL: 13 mg/dL (ref 0–40)

## 2013-04-29 LAB — VITAMIN B12: Vitamin B-12: 382 pg/mL (ref 211–911)

## 2013-04-29 LAB — PROTIME-INR
INR: 2.33 — ABNORMAL HIGH (ref <1.50)
Prothrombin Time: 25 seconds — ABNORMAL HIGH (ref 11.6–15.2)

## 2013-04-29 LAB — HEMOGLOBIN A1C
Hgb A1c MFr Bld: 6.2 % — ABNORMAL HIGH (ref <5.7)
MEAN PLASMA GLUCOSE: 131 mg/dL — AB (ref <117)

## 2013-04-29 LAB — TSH: TSH: 1.328 u[IU]/mL (ref 0.350–4.500)

## 2013-04-29 MED ORDER — SPIRONOLACTONE 25 MG PO TABS: 25.0000 mg | ORAL_TABLET | Freq: Every day | ORAL | Status: DC

## 2013-04-29 MED ORDER — DIGOXIN 125 MCG PO TABS: 0.1250 mg | ORAL_TABLET | Freq: Every day | ORAL | Status: DC

## 2013-04-29 MED ORDER — CARVEDILOL 12.5 MG PO TABS: 12.5000 mg | ORAL_TABLET | Freq: Two times a day (BID) | ORAL | Status: DC

## 2013-04-29 MED ORDER — RIVAROXABAN 20 MG PO TABS: 20.0000 mg | ORAL_TABLET | Freq: Every day | ORAL | Status: DC

## 2013-04-29 MED ORDER — FUROSEMIDE 80 MG PO TABS: 80.0000 mg | ORAL_TABLET | Freq: Two times a day (BID) | ORAL | Status: DC

## 2013-04-29 MED ORDER — LOSARTAN POTASSIUM 100 MG PO TABS: ORAL_TABLET | ORAL | Status: DC

## 2013-04-29 MED ORDER — POTASSIUM CHLORIDE CRYS ER 20 MEQ PO TBCR: EXTENDED_RELEASE_TABLET | ORAL | Status: DC

## 2013-04-29 NOTE — Patient Instructions (Addendum)
  Thank you for giving me the opportunity to serve you today.    Your diagnosis today includes: Encounter Diagnoses  Name Primary?  . CHF (congestive heart failure), NYHA class III Yes  . Obesity, unspecified   . Long term (current) use of anticoagulants   . History of DVT (deep vein thrombosis)   . Hx pulmonary embolism   . Erectile dysfunction   . Paresthesia   . Noncompliance   . Congestive heart failure, unspecified   . Chronic systolic heart failure      Specific recommendations today include:  I refilled your medications today to make sure you don't run out.  Please make followup appointments with both the cardiology office and the electrophysiology office (Dr. Johney Frame).  We will call back about the INR blood test.  It has to be under 3.  We will call you with INR result today and if less than 3, we will have you begin Xarelto 20 mg daily.  So do not take your Coumadin dose today, and await our call back.   Once we start Xarelto 20mg  daily, you'll continue this indefinitely.  I will send an email to your cardiologist to ask about weight loss medication  We are checking routine labs today  I recommend you plan to have routine followup with [place] in your cardiologist at least every 4-6 months   Return pending labs.

## 2013-04-29 NOTE — Progress Notes (Signed)
Subjective:   Roberto [name] is a 48 y.o. male presenting on 04/29/2013 with med check  He is here at my request for routine followup as he has not been good about coming in for routine care. He no showed his last visit that was scheduled for a week ago. He is also due back per cardiology followup.  General he says he has been doing fine, compliant with his medications, no medication side effects.  His weight is about the same, he states that he is exercising at least 4 days a week with cardio and weights.  He limits salt intake. He denies swelling, chest pain, shortness of breath. He has really no new complaints.  His history includes h/o morbid obesity, systolic HF due to NICM (EF25-30% 2013 ), h/o recurrent DVT with resultant PE and chronic venous insufficiency, h/o LV thrombus (on Coumadin), PSVT (elected for medical therapy over RFA on EP follow-up) and morbid obesity.  Admitted to [facility] 12/16/11 for recurrent HF. Diuresed 20 pounds with IV lasix.   He did inquire about weight loss surgery but his initial upfront co-pay would be to $3000 which he cannot afford .  No other complaint.  Review of Systems ROS as in subjective      Objective:    BP 116/72  Pulse 64  Temp(Src) 98.1 F (36.7 C) (Oral)  Resp 20  Wt 330 lb (149.687 kg) Wt Readings from Last 3 Encounters:  04/29/13 330 lb (149.687 kg)  09/28/12 324 lb (146.965 kg)  09/16/12 333 lb 12.8 oz (151.411 kg)    General appearance: alert, no distress, WD/WN, obese AA male Oral cavity: MMM, no lesions Neck: supple, no lymphadenopathy, no thyromegaly, no masses, no JVD or bruits Heart: RRR, normal S1, S2, no murmurs Lungs: CTA bilaterally, no wheezes, rhonchi, or rales Abdomen: +bs, soft, non tender, non distended, no masses, no hepatomegaly, no splenomegaly Pulses: 2+ symmetric, upper and lower extremities, normal cap refill No obvious edema      Assessment: Encounter Diagnoses  Name Primary?  . CHF (congestive  heart failure), NYHA class III Yes  . Obesity, unspecified   . Long term (current) use of anticoagulants   . History of DVT (deep vein thrombosis)   . Hx pulmonary embolism   . Erectile dysfunction   . Paresthesia   . Noncompliance   . Congestive heart failure, unspecified   . Chronic systolic heart failure      Plan: I had a frank discussion with him today about compliance, about taking care of himself, about followup.  Advised that he needs to go ahead and make followup appointments with cardiology and electrophysiology. Advise that we cannot continue to prescribe him Coumadin if he will not come in for PT/INR labs.    We will check routine labs today, advise he work on efforts to lose weight. Continue to limit salt intake, advise he continue checking daily weights to watch for fluid edema gain.  I gave a two-month supply on all his prescriptions today to make sure he does not run out of medication. This will give him time to follow up with cardiology.  After discussion of options, I advise that we switch to Xarelto for better compliance and to avoid having to check PT/INRs regularly which he has not been doing,   he is agreeable.  We are checking PT/INR today, and once the PT/INR is below 3 we will begin him on 20 mg samples. Samples given today.  We will also check  with cardiology to see if Qsymia, Belviq or Contrave would be an options for help with weight loss  Roberto [name] was seen today for med check.  Diagnoses and associated orders for this visit:  CHF (congestive heart failure), NYHA class III - Comprehensive metabolic panel - Lipid panel - CBC with Differential - TSH  Obesity, unspecified - Hemoglobin A1c  Long term (current) use of anticoagulants - Cancel: Protime-INR - Protime-INR  History of DVT (deep vein thrombosis) - Cancel: Protime-INR  Hx pulmonary embolism - Cancel: Protime-INR  Erectile dysfunction  Paresthesia - Vitamin  B12  Noncompliance  Congestive heart failure, unspecified - carvedilol (COREG) 12.5 MG tablet; Take 1 tablet (12.5 mg total) by mouth 2 (two) times daily with a meal. - spironolactone (ALDACTONE) 25 MG tablet; Take 1 tablet (25 mg total) by mouth daily.  Chronic systolic heart failure - furosemide (LASIX) 80 MG tablet; Take 1 tablet (80 mg total) by mouth 2 (two) times daily. - losartan (COZAAR) 100 MG tablet; TAKE ONE TABLET BY MOUTH ONCE DAILY  Other Orders - rivaroxaban (XARELTO) 20 MG TABS tablet; Take 1 tablet (20 mg total) by mouth daily with supper. - digoxin (LANOXIN) 0.125 MG tablet; Take 1 tablet (0.125 mg total) by mouth daily. - potassium chloride SA (KLOR-CON M20) 20 MEQ tablet; TAKE ONE TABLET BY MOUTH TWICE DAILY      Return pending labs.

## 2013-06-13 ENCOUNTER — Ambulatory Visit (HOSPITAL_COMMUNITY)
Admission: RE | Admit: 2013-06-13 | Discharge: 2013-06-13 | Disposition: A | Discharge status: Home / Self Care | Payer: BC Managed Care – PPO | Source: Ambulatory Visit | Attending: Internal Medicine | Admitting: Internal Medicine

## 2013-06-13 VITALS — BP 98/56 | HR 65 | Wt 339.0 lb

## 2013-06-13 DIAGNOSIS — I5022 Chronic systolic (congestive) heart failure: Secondary | ICD-10-CM | POA: Insufficient documentation

## 2013-06-13 DIAGNOSIS — I82509 Chronic embolism and thrombosis of unspecified deep veins of unspecified lower extremity: Secondary | ICD-10-CM | POA: Insufficient documentation

## 2013-06-13 DIAGNOSIS — I238 Other current complications following acute myocardial infarction: Secondary | ICD-10-CM

## 2013-06-13 DIAGNOSIS — Z7901 Long term (current) use of anticoagulants: Secondary | ICD-10-CM | POA: Insufficient documentation

## 2013-06-13 DIAGNOSIS — I2782 Chronic pulmonary embolism: Secondary | ICD-10-CM | POA: Insufficient documentation

## 2013-06-13 DIAGNOSIS — I509 Heart failure, unspecified: Secondary | ICD-10-CM | POA: Insufficient documentation

## 2013-06-13 NOTE — Patient Instructions (Signed)
Follow up in 6 months in 6 months   Do the following things EVERYDAY: 1) Weigh yourself in the morning before breakfast. Write it down and keep it in a log. 2) Take your medicines as prescribed 3) Eat low salt foods-Limit salt (sodium) to 2000 mg per day.  4) Stay as active as you can everyday 5) Limit all fluids for the day to less than 2 liters

## 2013-06-13 NOTE — Progress Notes (Signed)
Patient ID: Roberto [name], male   DOB: 06/28/1965, 48 y.o.   MRN: [id number] PCP: Dr Jacqulyn BathLong Cardiologist: Dr Jens Somrenshaw EP: Dr Johney FrameAllred Coumadin followed by PCP  HPI: Roberto [name] is a 48yo AA male with h/o systolic HF due to NICM (EF25-30% 2013 ), h/o recurrent DVT with resultant PE and chronic venous insufficiency, h/o LV thrombus (on Coumadin), PSVT (elected for medical therapy over RFA on EP follow-up) and morbid obesity.   Admitted to [facility] 12/16/11 for recurrent HF. Diuresed 20 pounds with IV lasix. See RHC/LHC results   12/17/11 ECHO 25-30% Diffuse hypokinesis. Doppler parameters are consistent with a reversible restrictive pattern, indicative of decreased left ventricular diastolic compliance and/or increased left atrial pressure (grade 3 diastolic dysfunction).  CARDIAC MRI - 12/18/11  1. Moderately dilated left ventricle with moderately decreased systolic function, EF 38%. Global hypokinesis.  2. Mildly dilated right ventricle with mild to moderately decreased systolic function.  3. No definite LV thrombus noted.  4. Patchy non-subendocardial delayed enhancement seen in the ventricular septum (see above for description). This is not suggestive of a coronary disease pattern. This could be suggestive of infiltrative disease versus prior myocarditis.  RHC/LHC 12/22/11  Cors: Normal  05/20/2012 ECHO EF 40-45%  He returns for follow up today.  Denies SOB/PND/Orthopnea.  Exercising 4-5 times a week with stationary bike, treadmill, and elliptical. Ongoing LLE edema. Weight 335-339 pounds. Following low salt diet and limits fluid intake intake. Compliant with medications. He has considered bariatric surgery however it will cost 3,000 and he says he can not afford it.    SH: Lives with his wife and 5 children. 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    ROS: All systems negative except as listed in HPI, PMH and Problem List.  Past Medical History  Diagnosis Date  . Mural thrombus of heart      coumadin  . Pulmonary embolism     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  . Paroxysmal SVT (supraventricular tachycardia)   . Morbid obesity   . Arthritis   . Nonischemic cardiomyopathy     a) 12/17/11 echo: LVEF 25-30%, grade 3 diastolic dysfunction (c/w restriction), mod MR, mod LA/LV and mild RA dilatation; b) 12/18/11 cMRI: LVEF 38%, mod LV/mild RV dilatation, global HK, mild-mod RV sys dysfxn, no LV thrombus & patchy non-subendocardial delayed enhancement c/w infil dz or prior myocarditis   . Deep vein thrombosis   . Lupus anticoagulant positive   . Chronic combined systolic and diastolic CHF (congestive heart failure)     Current Outpatient Prescriptions  Medication Sig Dispense Refill  . carvedilol (COREG) 12.5 MG tablet Take 1 tablet (12.5 mg total) by mouth 2 (two) times daily with a meal.  90 tablet  1  . digoxin (LANOXIN) 0.125 MG tablet Take 1 tablet (0.125 mg total) by mouth daily.  30 tablet  1  . furosemide (LASIX) 80 MG tablet Take 1 tablet (80 mg total) by mouth 2 (two) times daily.  60 tablet  1  . losartan (COZAAR) 100 MG tablet TAKE ONE TABLET BY MOUTH ONCE DAILY  30 tablet  1  . potassium chloride SA (KLOR-CON M20) 20 MEQ tablet TAKE ONE TABLET BY MOUTH TWICE DAILY  60 tablet  11  . rivaroxaban (XARELTO) 20 MG TABS tablet Take 1 tablet (20 mg total) by mouth daily with supper.  30 tablet  11  . sildenafil (VIAGRA) 100 MG tablet Take 1 tablet (100 mg total) by  mouth daily as needed for erectile dysfunction.  10 tablet  1  . spironolactone (ALDACTONE) 25 MG tablet Take 1 tablet (25 mg total) by mouth daily.  30 tablet  1   No current facility-administered medications for this encounter.     PHYSICAL EXAM: Filed Vitals:   06/13/13 1105  BP: 98/56  Pulse: 65  Weight: 339 lb (153.769 kg)  SpO2: 96%    General:  Well appearing. No resp difficulty. Ambulated in the clinic without difficulty.  HEENT: normal Neck: supple. JVP hard to see but does not appear  elevated. Carotids 2+ bilaterally; no bruits. No lymphadenopathy or thryomegaly appreciated. Cor: PMI normal. Regular rate & rhythm. No rubs, gallops or murmurs. Lungs: clear Abdomen: obese, soft, nontender, nondistended. No hepatosplenomegaly. No bruits or masses. Good bowel sounds. Extremities: no cyanosis, clubbing, rash, LLE trace(chronic venous stasis) no RLE edema. Neuro: alert & orientedx3, cranial nerves grossly intact. Moves all 4 extremities w/o difficulty. Affect pleasant.    ASSESSMENT & PLAN:  1. Chronic Systolic Heart Failure ECHO EF 40-45%. Overall he is doing well.  NYHA II. Volume status stable despite weight gain. ontinue lasix 80 mg twice a day and 25 mg spironolactone daily. Continue carvedilol 12.5 mg twice a day. - BP too low to titrate further Continue  losartan 100 mg daily unable to titrate due to low BP.  Reinforced daily weights   2. DVT/PE On Xarelto. Followed by Dr Jacqulyn Bath  3. Morbid Obestiy - considering bariatric surgery but cost is a limiting factor.    Follow up in 6 months with an ECHO.  Tonye Becket, NP-C

## 2013-08-14 ENCOUNTER — Other Ambulatory Visit (HOSPITAL_COMMUNITY): Payer: Self-pay | Admitting: Internal Medicine

## 2013-08-18 ENCOUNTER — Other Ambulatory Visit: Payer: Self-pay | Admitting: Medical

## 2013-08-18 NOTE — Telephone Encounter (Signed)
Ok to RF? 

## 2013-08-19 NOTE — Telephone Encounter (Signed)
Get him back in for f/u

## 2013-08-19 NOTE — Telephone Encounter (Signed)
Left message for pt to schedule appt

## 2013-09-06 ENCOUNTER — Other Ambulatory Visit (HOSPITAL_COMMUNITY): Payer: Self-pay | Admitting: Anesthesiology

## 2013-09-06 DIAGNOSIS — I5022 Chronic systolic (congestive) heart failure: Secondary | ICD-10-CM

## 2013-09-26 ENCOUNTER — Encounter: Payer: Self-pay | Admitting: Medical

## 2013-09-26 ENCOUNTER — Ambulatory Visit (INDEPENDENT_AMBULATORY_CARE_PROVIDER_SITE_OTHER): Payer: BC Managed Care – PPO | Admitting: Medical

## 2013-09-26 ENCOUNTER — Telehealth: Payer: Self-pay | Admitting: Medical

## 2013-09-26 DIAGNOSIS — Z23 Encounter for immunization: Secondary | ICD-10-CM

## 2013-09-26 DIAGNOSIS — I10 Essential (primary) hypertension: Secondary | ICD-10-CM

## 2013-09-26 DIAGNOSIS — R200 Anesthesia of skin: Secondary | ICD-10-CM

## 2013-09-26 DIAGNOSIS — Z86711 Personal history of pulmonary embolism: Secondary | ICD-10-CM

## 2013-09-26 DIAGNOSIS — R7301 Impaired fasting glucose: Secondary | ICD-10-CM

## 2013-09-26 DIAGNOSIS — E119 Type 2 diabetes mellitus without complications: Secondary | ICD-10-CM

## 2013-09-26 DIAGNOSIS — I509 Heart failure, unspecified: Secondary | ICD-10-CM

## 2013-09-26 DIAGNOSIS — R209 Unspecified disturbances of skin sensation: Secondary | ICD-10-CM

## 2013-09-26 LAB — POCT GLYCOSYLATED HEMOGLOBIN (HGB A1C): Hemoglobin A1C: 5.9

## 2013-09-26 NOTE — Progress Notes (Signed)
Subjective:   Roberto [name] is an 48 y.o. male who presents for follow up on chronic issues.  Still compliant with Xarelto, no bleeding, no bruising.    Exercises with elliptical and bicycle.   Uses [employer] or gym at [employer].   Using this daily, most days.  Diet - could be better, but tries to eat healthy.   Had smoothie this morning.   Busy on the job, and tends to eat late.  Last dietician visit - not in last year.  Weakness is that he eats late in the day sometimes including fast food.    Vaccines: Is getting flu vaccine today, got pneumococcal vaccine with hospitalization 2 years ago.  Hx/o numbness in arms/hands.  Gets numbness in bilat arms, worse in bilat hands and forearms.   No neck pain.   No upper arm numbness.   Gets numbness in hands, bilat, worse with typing.  This has worsened since last visit.  On the computer a lot.     Impaired glucose -  Patient is checking home blood sugars.   Home blood sugar records: doesn't remeber Current symptoms include: left leg swelling due to damage of the veins, . Patient denies hypoglycemia, polydipsia, polyuria Patient is checking their feet daily. Foot concerns (callous, ulcer, wound, thickened nails, toenail fungus, skin fungus, hammer toe): no concerns Last dilated eye exam  2014  Current treatments: no recent interventions. Medication compliance: good  Current diet: well balanced Current exercise: walking Known diabetic complications: peripheral neuropathy  He states that at his most recent cardiology visit, that they said he was allowed to take weight loss medications . He wants to discuss this today.  The following portions of the patient's history were reviewed and updated as appropriate: allergies, current medications, past family history, past medical history, past social history, past surgical history and problem list.  ROS as in subjective above    Objective:    Wt Readings from Last 3 Encounters:  09/26/13 338 lb  (153.316 kg)  06/13/13 339 lb (153.769 kg)  04/29/13 330 lb (149.687 kg)   Filed Vitals:   09/26/13 1102  BP: 100/60  Pulse: 64  Temp: 98.3 F (36.8 C)  Resp: 16    General appearance: alert, no distress, WD/WN Neck: supple, no lymphadenopathy, no thyromegaly, no masses, no bruits, no JVD Heart: RRR, normal S1, S2, no murmurs Lungs: CTA bilaterally, no wheezes, rhonchi, or rales Abdomen: +bs, soft, non tender, non distended, no masses, no hepatomegaly, no splenomegaly UE normal pulses, normal sensation and strength and DTRs, -phalens and tinels    Assessment:   Encounter Diagnoses  Name Primary?  . Morbid obesity Yes  . Impaired fasting blood sugar   . Essential hypertension, benign   . Chronic heart failure, unspecified heart failure type   . Need for prophylactic vaccination and inoculation against influenza   . Need for Tdap vaccination   . Type II or unspecified type diabetes mellitus without mention of complication, not stated as uncontrolled   . History of pulmonary embolism     Plan:   Morbid obesity - I will message cardiology to get their opinion on safe options for weigh loss medications.  He still needs to do better with his diet, and he realizes this.  He is exercising most days.  Will consider medications pending cardiology feedback, consider consult again with dietician.  Impaired fasting glucose - HgbA1C 5.9%.    HTN - compliant with BP medications.  Chronic heart failure -  managed by cardiology, last cardiology notes reviewed.  Counseled on the influenza virus vaccine.  Vaccine information sheet given.  Influenza vaccine given after consent obtained.  Counseled on the Tdap (tetanus, diptheria, and acellular pertussis) vaccine.  Vaccine information sheet given. Tdap vaccine given after consent obtained.  Hx/o PE, DVT - c/t Xarelto indefinitely  Paresthesias - likely carpal tunnel syndrome bilat - begin reinforced wrists splints.  Discussed possibly  needing EMG/NCS.  F/u pending labs.

## 2013-09-26 NOTE — Telephone Encounter (Signed)
Entered in error

## 2013-09-27 LAB — COMPREHENSIVE METABOLIC PANEL
ALBUMIN: 4.1 g/dL (ref 3.5–5.2)
ALT: 15 U/L (ref 0–53)
AST: 16 U/L (ref 0–37)
Alkaline Phosphatase: 39 U/L (ref 39–117)
BUN: 15 mg/dL (ref 6–23)
CO2: 27 mEq/L (ref 19–32)
Calcium: 8.8 mg/dL (ref 8.4–10.5)
Chloride: 97 mEq/L (ref 96–112)
Creat: 0.97 mg/dL (ref 0.50–1.35)
Glucose, Bld: 133 mg/dL — ABNORMAL HIGH (ref 70–99)
POTASSIUM: 4.2 meq/L (ref 3.5–5.3)
SODIUM: 137 meq/L (ref 135–145)
Total Bilirubin: 0.6 mg/dL (ref 0.2–1.2)
Total Protein: 7.4 g/dL (ref 6.0–8.3)

## 2013-10-03 ENCOUNTER — Telehealth: Payer: Self-pay | Admitting: Medical

## 2013-10-03 DIAGNOSIS — I5022 Chronic systolic (congestive) heart failure: Secondary | ICD-10-CM

## 2013-10-03 MED ORDER — POTASSIUM CHLORIDE CRYS ER 20 MEQ PO TBCR: EXTENDED_RELEASE_TABLET | ORAL | Status: DC

## 2013-10-03 MED ORDER — DIGOXIN 125 MCG PO TABS: 0.1250 mg | ORAL_TABLET | Freq: Every day | ORAL | Status: DC

## 2013-10-03 MED ORDER — FUROSEMIDE 80 MG PO TABS: 80.0000 mg | ORAL_TABLET | Freq: Two times a day (BID) | ORAL | Status: DC

## 2013-10-03 MED ORDER — CARVEDILOL 12.5 MG PO TABS: ORAL_TABLET | ORAL | Status: DC

## 2013-10-03 NOTE — Telephone Encounter (Signed)
Pt came in recently for a medcheck with Vincenza Hews. Refills are needed for Potassium, Digoxin, Lasix and Coreg. [facility].

## 2013-10-03 NOTE — Telephone Encounter (Signed)
Refilled meds

## 2013-10-17 ENCOUNTER — Other Ambulatory Visit: Payer: Self-pay | Admitting: Medical

## 2013-10-17 MED ORDER — NALTREXONE-BUPROPION HCL ER 8-90 MG PO TB12: 2.0000 | ORAL_TABLET | Freq: Two times a day (BID) | ORAL | Status: DC

## 2013-10-18 ENCOUNTER — Telehealth: Payer: Self-pay | Admitting: Family Medicine

## 2013-10-18 ENCOUNTER — Other Ambulatory Visit: Payer: Self-pay | Admitting: Family Medicine

## 2013-10-18 NOTE — Telephone Encounter (Signed)
I call out contrave to the patients pharmacy

## 2013-10-18 NOTE — Telephone Encounter (Signed)
Its listed as Naltrexone Buproprion or Contrave.

## 2013-10-18 NOTE — Telephone Encounter (Signed)
Vincenza Hews , can you please add this medication into the system doseage because I don't know that kind of information so that I can call this into the pharmacy.

## 2013-10-18 NOTE — Telephone Encounter (Signed)
Message copied by Janeice Robinson on Tue Oct 18, 2013  2:58 PM ------      Message from: Jac Canavan      Created: Mon Oct 17, 2013  2:34 PM      Regarding: FW: weight loss medication       Save this to phone message.            Cardiology is ok with medication Contrave.             Please call this out.  Begin 1 tablet daily for a week, then BID.  Stay with 1 tablet BID for at least 3-4 weeks, then f/u.              We can ultimately go higher on dose, but lets start with this to help efforts at weight loss.            Get him coupon card to activate the medication.             Vincenza Hews      ----- Message -----         From: Sherald Hess, NP         Sent: 10/12/2013   8:04 PM           To: Dolores Patty, MD, Jac Canavan, PA-C      Subject: RE: weight loss medication                               Monica Martinez.       After discussing with Dr Gala Romney and our heart failure pharmacist, we  favor Contrave.             Please let us know if he doesn't improve and we can investigate further. I sure hope it works!!             Thanks Amy       ----- Message -----         From: Jac Canavan, PA-C         Sent: 10/12/2013   7:31 AM           To: Sherald Hess, NP      Subject: RE: weight loss medication                               Hello again Amy            I hate to bother you, but I still haven't heard any answers from Dr. Gala Romney regarding weight loss medication safety for Mr. Gavette.            Could you please ask him to take a moment to give me some feedback on this.            Thanks      Kristian Covey, PA-C                  ----- Message -----         From: Sherald Hess, NP         Sent: 09/30/2013   8:11 AM           To: Jac Canavan, PA-C      Subject: RE: weight loss medication  I havent heard anything yet. I will let you know.             Sorry for the delay.             Amy      ----- Message -----         From:  Jac Canavanavid S Tysinger, PA-C         Sent: 09/30/2013   7:53 AM           To: Sherald HessAmy D Clegg, NP      Subject: RE: weight loss medication                               Good morning Amy            Any word back yet?  I leave for vacation tomorrow and will be gone all week, so wondering if you had a chance to hear back from Dr. Gala RomneyBensimhon?            Thanks      Vincenza HewsShane            ----- Message -----         From: Sherald HessAmy D Clegg, NP         Sent: 09/26/2013   1:43 PM           To: Dolores Pattyaniel R Bensimhon, MD, Marcelino ScotLisa Marie Curran, [facility], #      Subject: RE: weight loss medication                               Thanks so much for the question. I will discuss with Dr Gala RomneyBensimhon and get back to you.             Thanks Amy       ----- Message -----         From: Jac Canavanavid S Tysinger, PA-C         Sent: 09/26/2013  11:57 AM           To: Sherald HessAmy D Clegg, NP      Subject: weight loss medication                                   Hello Mrs. Clegg            I saw Mr. Francene FindersDanzy today our mutual patient.              I would like to prescribe weight loss medication to help him lose weight.  He seems to be heavier every time I see him.  He can't afford bariatric surgery copay.            From a cardiac safety standpoint, can I get your opinion on the safety of the following medications.  Which ones would be acceptable risk, which would you recommend I avoid altogether.             Qsymia      Contrave      Belviq      Generic Phentermine + Topamax            I appreciate your feedback.      Kristian CoveyShane Tysinger, PA-C                                     ------

## 2013-11-03 ENCOUNTER — Telehealth: Payer: Self-pay

## 2013-11-03 NOTE — Telephone Encounter (Signed)
Patient called wondering if his rx for contrave had been called in, I checked with pharmacist and he confirmed it was there, he has coupon card he will bring with him.

## 2013-11-10 ENCOUNTER — Telehealth: Payer: Self-pay | Admitting: Family Medicine

## 2013-11-10 NOTE — Telephone Encounter (Signed)
[facility] called and advised Contrave approved from 10/11/13 - 03/10/2014.  Called pt advised of same.

## 2013-12-07 ENCOUNTER — Telehealth: Payer: Self-pay | Admitting: Internal Medicine

## 2013-12-07 ENCOUNTER — Other Ambulatory Visit: Payer: Self-pay | Admitting: Family Medicine

## 2013-12-07 DIAGNOSIS — I5022 Chronic systolic (congestive) heart failure: Secondary | ICD-10-CM

## 2013-12-07 MED ORDER — FUROSEMIDE 80 MG PO TABS: 80.0000 mg | ORAL_TABLET | Freq: Two times a day (BID) | ORAL | Status: DC

## 2013-12-07 NOTE — Telephone Encounter (Signed)
Pt needs a refill on furosemide 80mg  to [facility] @ [address] [place]

## 2013-12-07 NOTE — Telephone Encounter (Signed)
Medication refill was sent to his pharmacy

## 2013-12-22 ENCOUNTER — Encounter (HOSPITAL_COMMUNITY): Payer: Self-pay | Admitting: Internal Medicine

## 2014-01-05 ENCOUNTER — Other Ambulatory Visit (HOSPITAL_COMMUNITY): Payer: Self-pay | Admitting: Internal Medicine

## 2014-02-06 ENCOUNTER — Other Ambulatory Visit: Payer: Self-pay | Admitting: Medical

## 2014-02-14 ENCOUNTER — Telehealth (HOSPITAL_COMMUNITY): Payer: Self-pay | Admitting: Vascular Surgery

## 2014-02-14 DIAGNOSIS — I5022 Chronic systolic (congestive) heart failure: Secondary | ICD-10-CM

## 2014-02-14 MED ORDER — SPIRONOLACTONE 25 MG PO TABS: 25.0000 mg | ORAL_TABLET | Freq: Every day | ORAL | Status: DC

## 2014-02-14 NOTE — Telephone Encounter (Signed)
Pt need new prescription Spiroolactone

## 2014-02-14 NOTE — Telephone Encounter (Signed)
Refill sent in

## 2014-02-15 ENCOUNTER — Other Ambulatory Visit (HOSPITAL_COMMUNITY): Payer: Self-pay | Admitting: *Deleted

## 2014-02-15 DIAGNOSIS — I5022 Chronic systolic (congestive) heart failure: Secondary | ICD-10-CM

## 2014-02-15 MED ORDER — SPIRONOLACTONE 25 MG PO TABS: 25.0000 mg | ORAL_TABLET | Freq: Every day | ORAL | Status: DC

## 2014-02-20 ENCOUNTER — Other Ambulatory Visit: Payer: Self-pay | Admitting: Medical

## 2014-02-20 NOTE — Telephone Encounter (Signed)
Is this okay to refill? 

## 2014-02-21 NOTE — Telephone Encounter (Signed)
I left a detailed message on the patients voicemail about setting up an appointment to follow up on his medication refills

## 2014-02-22 ENCOUNTER — Other Ambulatory Visit (HOSPITAL_COMMUNITY): Payer: BC Managed Care – PPO

## 2014-03-07 ENCOUNTER — Ambulatory Visit (HOSPITAL_COMMUNITY)
Admission: RE | Admit: 2014-03-07 | Discharge: 2014-03-07 | Disposition: A | Discharge status: Home / Self Care | Payer: BC Managed Care – PPO | Source: Ambulatory Visit | Attending: Adult Health | Admitting: Adult Health

## 2014-03-07 ENCOUNTER — Encounter (HOSPITAL_COMMUNITY): Payer: Self-pay

## 2014-03-07 VITALS — BP 110/66 | HR 83 | Wt 344.4 lb

## 2014-03-07 DIAGNOSIS — I872 Venous insufficiency (chronic) (peripheral): Secondary | ICD-10-CM

## 2014-03-07 DIAGNOSIS — R0600 Dyspnea, unspecified: Secondary | ICD-10-CM | POA: Diagnosis not present

## 2014-03-07 DIAGNOSIS — I82409 Acute embolism and thrombosis of unspecified deep veins of unspecified lower extremity: Secondary | ICD-10-CM | POA: Insufficient documentation

## 2014-03-07 DIAGNOSIS — Z7901 Long term (current) use of anticoagulants: Secondary | ICD-10-CM | POA: Diagnosis not present

## 2014-03-07 DIAGNOSIS — I5022 Chronic systolic (congestive) heart failure: Secondary | ICD-10-CM | POA: Diagnosis not present

## 2014-03-07 LAB — BASIC METABOLIC PANEL
Anion gap: 9 (ref 5–15)
BUN: 13 mg/dL (ref 6–23)
CALCIUM: 8.9 mg/dL (ref 8.4–10.5)
CO2: 23 mmol/L (ref 19–32)
Chloride: 106 mmol/L (ref 96–112)
Creatinine, Ser: 0.87 mg/dL (ref 0.50–1.35)
GFR calc non Af Amer: >90 mL/min (ref >90)
Glucose, Bld: 132 mg/dL — ABNORMAL HIGH (ref 70–99)
POTASSIUM: 4.3 mmol/L (ref 3.5–5.1)
SODIUM: 138 mmol/L (ref 135–145)

## 2014-03-07 LAB — BRAIN NATRIURETIC PEPTIDE: B NATRIURETIC PEPTIDE 5: 279.8 pg/mL — AB (ref 0.0–100.0)

## 2014-03-07 LAB — DIGOXIN LEVEL: DIGOXIN LVL: 0.4 ng/mL — AB (ref 0.8–2.0)

## 2014-03-07 MED ORDER — LOSARTAN POTASSIUM 100 MG PO TABS: ORAL_TABLET | ORAL | Status: DC

## 2014-03-07 MED ORDER — CARVEDILOL 12.5 MG PO TABS: ORAL_TABLET | ORAL | Status: DC

## 2014-03-07 MED ORDER — FUROSEMIDE 80 MG PO TABS: 80.0000 mg | ORAL_TABLET | Freq: Two times a day (BID) | ORAL | Status: DC

## 2014-03-07 MED ORDER — DIGOXIN 125 MCG PO TABS: 0.1250 mg | ORAL_TABLET | Freq: Every day | ORAL | Status: DC

## 2014-03-07 NOTE — Progress Notes (Signed)
Patient ID: Roberto [name], male   DOB: 19-Sep-1965, 49 y.o.   MRN: [id number] PCP: Dr Jacqulyn Bath Cardiologist: Dr [name] EP: Dr Johney Frame Coumadin followed by PCP  HPI: Roberto [name] is a 49yo AA male with h/o systolic HF due to NICM (EF25-30% 2013 ), h/o recurrent DVT with resultant PE and chronic venous insufficiency, h/o LV thrombus (on Coumadin), PSVT (elected for medical therapy over RFA on EP follow-up) and morbid obesity.   Admitted to [facility] 12/16/11 for recurrent HF. Diuresed 20 pounds with IV lasix. See RHC/LHC results   He returns for follow up today. Overall feeling great. Started on contrave for weight loss. Weight at home 340 pounds.  Denies SOB/PND/Orthopnea.  Exercising 4-5 times a week with stationary bike, treadmill, and elliptical. Ongoing LLE edema. Weight 335-339 pounds. Following low salt diet and limits fluid intake intake. Compliant with medications.   12/17/11 ECHO 25-30% Diffuse hypokinesis. Doppler parameters are consistent with a reversible restrictive pattern, indicative of decreased left ventricular diastolic compliance and/or increased left atrial pressure (grade 3 diastolic dysfunction).  CARDIAC MRI - 12/18/11  1. Moderately dilated left ventricle with moderately decreased systolic function, EF 38%. Global hypokinesis.  2. Mildly dilated right ventricle with mild to moderately decreased systolic function.  3. No definite LV thrombus noted.  4. Patchy non-subendocardial delayed enhancement seen in the ventricular septum (see above for description). This is not suggestive of a coronary disease pattern. This could be suggestive of infiltrative disease versus prior myocarditis.  RHC/LHC 12/22/11  Cors: Normal  05/20/2012 ECHO EF 40-45%   SH: Lives with his wife and 5 children. Works full time at [employer] as Roberto [name].    ROS: All systems negative except as listed in HPI, PMH and Problem List.  Past Medical History  Diagnosis Date  . Mural thrombus of heart      coumadin  . Pulmonary embolism     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  . Paroxysmal SVT (supraventricular tachycardia)   . Morbid obesity   . Arthritis   . Nonischemic cardiomyopathy     a) 12/17/11 echo: LVEF 25-30%, grade 3 diastolic dysfunction (c/w restriction), mod MR, mod LA/LV and mild RA dilatation; b) 12/18/11 cMRI: LVEF 38%, mod LV/mild RV dilatation, global HK, mild-mod RV sys dysfxn, no LV thrombus & patchy non-subendocardial delayed enhancement c/w infil dz or prior myocarditis   . Deep vein thrombosis   . Lupus anticoagulant positive   . Chronic combined systolic and diastolic CHF (congestive heart failure)     Current Outpatient Prescriptions  Medication Sig Dispense Refill  . carvedilol (COREG) 12.5 MG tablet TAKE ONE TABLET BY MOUTH TWICE DAILY WITH MEALS 90 tablet 0  . digoxin (LANOXIN) 0.125 MG tablet Take 1 tablet (0.125 mg total) by mouth daily. 30 tablet 1  . furosemide (LASIX) 80 MG tablet TAKE ONE TABLET BY MOUTH TWICE DAILY 60 tablet 0  . losartan (COZAAR) 100 MG tablet TAKE ONE TABLET BY MOUTH ONCE DAILY 30 tablet 1  . Naltrexone-Bupropion HCl ER 8-90 MG TB12 Take 2 tablets by mouth 2 (two) times daily with a meal. 120 tablet 1  . potassium chloride SA (KLOR-CON M20) 20 MEQ tablet TAKE ONE TABLET BY MOUTH TWICE DAILY 60 tablet 1  . rivaroxaban (XARELTO) 20 MG TABS tablet Take 1 tablet (20 mg total) by mouth daily with supper. 30 tablet 11  . sildenafil (VIAGRA) 100 MG tablet Take 1 tablet (100 mg total) by mouth daily as needed  for erectile dysfunction. 10 tablet 1  . spironolactone (ALDACTONE) 25 MG tablet Take 1 tablet (25 mg total) by mouth daily. 30 tablet 3   No current facility-administered medications for this encounter.     PHYSICAL EXAM: Filed Vitals:   03/07/14 0940  BP: 110/66  Pulse: 83  Weight: 344 lb 6.4 oz (156.219 kg)  SpO2: 95%    General:  Well appearing. No resp difficulty. Ambulated in the clinic without difficulty.  HEENT:  normal Neck: supple. JVP hard to see but does not appear elevated. Carotids 2+ bilaterally; no bruits. No lymphadenopathy or thryomegaly appreciated. Cor: PMI normal. Regular rate & rhythm. No rubs, gallops or murmurs. Lungs: clear Abdomen: obese, soft, nontender, nondistended. No hepatosplenomegaly. No bruits or masses. Good bowel sounds. Extremities: no cyanosis, clubbing, rash, LLE trace(chronic venous stasis) no RLE edema. Neuro: alert & orientedx3, cranial nerves grossly intact. Moves all 4 extremities w/o difficulty. Affect pleasant.    ASSESSMENT & PLAN:  1. Chronic Systolic Heart Failure ECHO EF 40-45%.  NYHAI  Volume status stable.  Continue lasix 80 mg twice a day and 25 mg spironolactone daily. Continue carvedilol 12.5 mg twice a day. - Continue  losartan 100 mg daily .  Reinforced daily weights  Check BMET, dig, and BNP today.  2. DVT/PE-On Xarelto. Followed by Dr Jacqulyn Bath 3. Morbid Obestiy - considering bariatric surgery but cost is a limiting factor. Needs to lose weight. On weight medication per PCP.   Check ECHO next week. . Check BMET and BNP today.  Follow up 6 weeks  Tonye Becket, NP-C

## 2014-03-07 NOTE — Patient Instructions (Signed)
Return for Echocardiogram in 1-2 weeks.  Follow up 6 months.  Do the following things EVERYDAY: 1) Weigh yourself in the morning before breakfast. Write it down and keep it in a log. 2) Take your medicines as prescribed 3) Eat low salt foods-Limit salt (sodium) to 2000 mg per day.  4) Stay as active as you can everyday 5) Limit all fluids for the day to less than 2 liters

## 2014-03-22 ENCOUNTER — Ambulatory Visit (HOSPITAL_COMMUNITY)
Admission: RE | Admit: 2014-03-22 | Discharge: 2014-03-22 | Disposition: A | Discharge status: Home / Self Care | Payer: BC Managed Care – PPO | Source: Ambulatory Visit | Attending: Advanced Practice Midwife | Admitting: Advanced Practice Midwife

## 2014-03-22 ENCOUNTER — Ambulatory Visit: Payer: BC Managed Care – PPO | Admitting: Medical

## 2014-03-22 DIAGNOSIS — I5022 Chronic systolic (congestive) heart failure: Secondary | ICD-10-CM | POA: Diagnosis present

## 2014-03-22 DIAGNOSIS — I509 Heart failure, unspecified: Secondary | ICD-10-CM

## 2014-03-22 MED ORDER — PERFLUTREN LIPID MICROSPHERE
1.0000 mL | INTRAVENOUS | Status: AC | PRN
  Administered 2014-03-22 (×2): 2 mL via INTRAVENOUS
  Filled 2014-03-22: qty 10

## 2014-03-22 NOTE — Progress Notes (Deleted)
Echocardiogram 2D Echocardiogram has been performed.  Jsaon Yoo 03/22/2014, 9:47 AM

## 2014-03-22 NOTE — Progress Notes (Signed)
Echocardiogram 2D Echocardiogram with Definity has been performed.  Roberto [name] 03/22/2014, 12:02 PM

## 2014-03-24 MED FILL — Perflutren Lipid Microsphere IV Susp 1.1 MG/ML: INTRAVENOUS | Qty: 10 | Status: AC

## 2014-05-01 ENCOUNTER — Telehealth (HOSPITAL_COMMUNITY): Payer: Self-pay | Admitting: Cardiology

## 2014-05-01 MED ORDER — FUROSEMIDE 80 MG PO TABS: 120.0000 mg | ORAL_TABLET | Freq: Two times a day (BID) | ORAL | Status: DC

## 2014-05-01 NOTE — Telephone Encounter (Signed)
For now, let's make his standing dose of Lasix 120 qam, 120 qpm.  Can have 48 hour holter for palpitations.  BMET in 1 week and followup in office this week or next.

## 2014-05-01 NOTE — Telephone Encounter (Signed)
Will send to Dr McLean for review 

## 2014-05-01 NOTE — Telephone Encounter (Signed)
Pt aware and agreeable, he will change Lasix dose and let us know if not improving, labs sch for 4/27, he states palps are not consistent  and he feels could be due to his SOB, he would like to hold off on monitor for now, if do not improve he will call back for monitor.

## 2014-05-01 NOTE — Telephone Encounter (Signed)
Pt called with C/O increased SOB with exertion Pt states he is fine as long as he is at rest Pt does admit to having CHEST PALPITATIONS off and on x 4 days Pt states he has not needed to use any additional pillows to rest, currently uses 2 pts spouse reports "he sounds wet" while asleep  Weight stable at 342 lbs Pt has increased Lasix to 160 mg in the AM and 80 in the PM x 5 days Denies increased edema  Please advise

## 2014-05-04 ENCOUNTER — Ambulatory Visit (INDEPENDENT_AMBULATORY_CARE_PROVIDER_SITE_OTHER): Payer: BC Managed Care – PPO | Admitting: Medical

## 2014-05-04 ENCOUNTER — Encounter: Payer: Self-pay | Admitting: Medical

## 2014-05-04 VITALS — BP 118/70 | HR 86 | Temp 98.0°F | Resp 16 | Wt 347.0 lb

## 2014-05-04 DIAGNOSIS — E669 Obesity, unspecified: Secondary | ICD-10-CM | POA: Diagnosis not present

## 2014-05-04 DIAGNOSIS — R7301 Impaired fasting glucose: Secondary | ICD-10-CM | POA: Diagnosis not present

## 2014-05-04 DIAGNOSIS — I10 Essential (primary) hypertension: Secondary | ICD-10-CM | POA: Diagnosis not present

## 2014-05-04 DIAGNOSIS — R635 Abnormal weight gain: Secondary | ICD-10-CM | POA: Diagnosis not present

## 2014-05-04 DIAGNOSIS — Z7901 Long term (current) use of anticoagulants: Secondary | ICD-10-CM

## 2014-05-04 DIAGNOSIS — T50905A Adverse effect of unspecified drugs, medicaments and biological substances, initial encounter: Secondary | ICD-10-CM

## 2014-05-04 LAB — POCT GLYCOSYLATED HEMOGLOBIN (HGB A1C): HEMOGLOBIN A1C: 6.2

## 2014-05-04 MED ORDER — NALTREXONE-BUPROPION HCL ER 8-90 MG PO TB12: 2.0000 | ORAL_TABLET | Freq: Two times a day (BID) | ORAL | Status: DC

## 2014-05-04 MED ORDER — LIRAGLUTIDE 18 MG/3ML ~~LOC~~ SOPN: 1.8000 mg | PEN_INJECTOR | Freq: Every day | SUBCUTANEOUS | Status: DC

## 2014-05-04 NOTE — Progress Notes (Signed)
  Subjective:   Roberto [name] is an 49 y.o. male who presents for follow up on chronic issues.  Here mainly to restart weight loss medication Contrave.  Used it for a month few months ago, but ran out.   Did notice decrease in appetite on the medication  Still compliant with Xarelto, no bleeding, no bruising, but given much increased gas, wants to go back on coumadin.  Exercises with elliptical and bicycle.   Going to [employer], exercise 30 minutes most days  Diet - could be better, but tries to eat healthy.   Had smoothie this morning.   Busy on the job, and tends to eat late.  Last dietician visit - not in last year.  Weakness is that he eats late in the day sometimes including fast food.  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  ROS as in subjective above    Objective:    Wt Readings from Last 3 Encounters:  05/04/14 347 lb (157.398 kg)  09/26/13 338 lb (153.316 kg)  06/13/13 339 lb (153.769 kg)   BP 118/70 mmHg  Pulse 86  Temp(Src) 98 F (36.7 C) (Oral)  Resp 16  Wt 347 lb (157.398 kg)  General appearance: alert, no distress, WD/WN, obese AA male Neck: supple, no lymphadenopathy, no thyromegaly, no masses, no bruits, no JVD Heart: RRR, normal S1, S2, no murmurs Lungs: CTA bilaterally, no wheezes, rhonchi, or rales Abdomen: +bs, soft, non tender, non distended, no masses, no hepatomegaly, no splenomegaly UE normal pulses, normal sensation and strength and DTRs, -phalens and tinels Pulses normal   Assessment:   Encounter Diagnoses  Name Primary?  . Obesity Yes  . Impaired fasting blood sugar   . Weight gain   . Essential hypertension   . Long-term (current) use of  anticoagulants   . Adverse effects of medication, initial encounter     Plan:   Morbid obesity - spent 30 minutes discussing prior efforts at weight loss, unable to lose and actually continues to gain weight.  At this point we had a long discussion about diet, exercise, target heart rate for exercise, variable types of exercise, tracking calories with My Fitness Pal app for smart phone.   Begin back on Contrave for weight loss.  Begin victoza nightly injection for impaired glucose and weight loss efforts.  recheck 6mo, sooner prn  Continue all other medications as usual  PT/INR lab today.  He wants to stop xarelto and go back to coumadin given flatulence and other adverse effects.  F/u pending PT/INR labs.   F/u pending labs.

## 2014-05-05 ENCOUNTER — Telehealth: Payer: Self-pay | Admitting: Medical

## 2014-05-05 LAB — PROTIME-INR
INR: 1.6 — AB (ref <1.50)
PROTHROMBIN TIME: 19.1 s — AB (ref 11.6–15.2)

## 2014-05-05 NOTE — Telephone Encounter (Signed)
Prior Auth request for Contrave 8-90mg  tab #120

## 2014-05-08 ENCOUNTER — Other Ambulatory Visit: Payer: Self-pay | Admitting: Medical

## 2014-05-08 MED ORDER — ENOXAPARIN SODIUM 150 MG/ML ~~LOC~~ SOLN: 150.0000 mg | Freq: Two times a day (BID) | SUBCUTANEOUS | Status: DC

## 2014-05-08 MED ORDER — WARFARIN SODIUM 5 MG PO TABS: 5.0000 mg | ORAL_TABLET | Freq: Every day | ORAL | Status: DC

## 2014-05-08 NOTE — Telephone Encounter (Signed)
Pt called for refill on potassium kds 05/08/2014

## 2014-05-10 ENCOUNTER — Ambulatory Visit (HOSPITAL_COMMUNITY)
Admission: RE | Admit: 2014-05-10 | Discharge: 2014-05-10 | Disposition: A | Discharge status: Home / Self Care | Payer: BC Managed Care – PPO | Source: Ambulatory Visit | Attending: Internal Medicine | Admitting: Internal Medicine

## 2014-05-10 DIAGNOSIS — I5022 Chronic systolic (congestive) heart failure: Secondary | ICD-10-CM | POA: Diagnosis not present

## 2014-05-10 LAB — BASIC METABOLIC PANEL
Anion gap: 8 (ref 5–15)
BUN: 14 mg/dL (ref 6–23)
CO2: 29 mmol/L (ref 19–32)
Calcium: 8.8 mg/dL (ref 8.4–10.5)
Chloride: 100 mmol/L (ref 96–112)
Creatinine, Ser: 1.08 mg/dL (ref 0.50–1.35)
GFR calc Af Amer: >90 mL/min (ref >90)
GFR calc non Af Amer: 79 mL/min — ABNORMAL LOW (ref >90)
Glucose, Bld: 88 mg/dL (ref 70–99)
POTASSIUM: 3.8 mmol/L (ref 3.5–5.1)
Sodium: 137 mmol/L (ref 135–145)

## 2014-05-17 NOTE — Telephone Encounter (Signed)
Initiated P.A. Contrave

## 2014-05-18 NOTE — Telephone Encounter (Signed)
P.A. Shona Simpson approved til 09/14/14, faxed pharmacy, Pt informed

## 2014-05-19 ENCOUNTER — Other Ambulatory Visit (HOSPITAL_COMMUNITY): Payer: Self-pay | Admitting: Cardiology

## 2014-05-19 ENCOUNTER — Other Ambulatory Visit: Payer: BC Managed Care – PPO

## 2014-05-19 DIAGNOSIS — Z7901 Long term (current) use of anticoagulants: Secondary | ICD-10-CM

## 2014-05-19 LAB — PROTIME-INR
INR: 1.21 (ref <1.50)
Prothrombin Time: 15.4 seconds — ABNORMAL HIGH (ref 11.6–15.2)

## 2014-05-19 MED ORDER — FUROSEMIDE 80 MG PO TABS: 120.0000 mg | ORAL_TABLET | Freq: Two times a day (BID) | ORAL | Status: DC

## 2014-05-21 ENCOUNTER — Other Ambulatory Visit: Payer: Self-pay | Admitting: Medical

## 2014-05-21 MED ORDER — WARFARIN SODIUM 3 MG PO TABS: 3.0000 mg | ORAL_TABLET | Freq: Every day | ORAL | Status: DC

## 2014-05-26 ENCOUNTER — Other Ambulatory Visit: Payer: Self-pay | Admitting: Medical

## 2014-05-26 ENCOUNTER — Ambulatory Visit (INDEPENDENT_AMBULATORY_CARE_PROVIDER_SITE_OTHER): Payer: BC Managed Care – PPO

## 2014-05-26 DIAGNOSIS — Z7901 Long term (current) use of anticoagulants: Secondary | ICD-10-CM

## 2014-05-26 LAB — PROTIME-INR
INR: 1.55 — ABNORMAL HIGH (ref <1.50)
Prothrombin Time: 18.7 seconds — ABNORMAL HIGH (ref 11.6–15.2)

## 2014-05-26 MED ORDER — ENOXAPARIN SODIUM 150 MG/ML ~~LOC~~ SOLN: 150.0000 mg | Freq: Two times a day (BID) | SUBCUTANEOUS | Status: DC

## 2014-05-26 MED ORDER — WARFARIN SODIUM 10 MG PO TABS: 10.0000 mg | ORAL_TABLET | Freq: Every day | ORAL | Status: DC

## 2014-05-29 ENCOUNTER — Ambulatory Visit: Payer: Self-pay

## 2014-05-30 NOTE — Progress Notes (Signed)
This encounter was created in error - please disregard.

## 2014-06-02 ENCOUNTER — Ambulatory Visit (INDEPENDENT_AMBULATORY_CARE_PROVIDER_SITE_OTHER): Payer: BC Managed Care – PPO

## 2014-06-02 ENCOUNTER — Other Ambulatory Visit: Payer: Self-pay | Admitting: Medical

## 2014-06-02 DIAGNOSIS — Z7901 Long term (current) use of anticoagulants: Secondary | ICD-10-CM

## 2014-06-02 LAB — PROTIME-INR
INR: 2.36 — ABNORMAL HIGH (ref <1.50)
PROTHROMBIN TIME: 26 s — AB (ref 11.6–15.2)

## 2014-06-02 MED ORDER — WARFARIN SODIUM 1 MG PO TABS: 1.0000 mg | ORAL_TABLET | Freq: Every day | ORAL | Status: DC

## 2014-06-07 ENCOUNTER — Other Ambulatory Visit: Payer: Self-pay | Admitting: Medical

## 2014-06-14 ENCOUNTER — Other Ambulatory Visit: Payer: Self-pay | Admitting: Medical

## 2014-06-14 ENCOUNTER — Other Ambulatory Visit: Payer: Self-pay | Admitting: Family Medicine

## 2014-06-14 ENCOUNTER — Ambulatory Visit (INDEPENDENT_AMBULATORY_CARE_PROVIDER_SITE_OTHER): Payer: BC Managed Care – PPO

## 2014-06-14 DIAGNOSIS — Z7901 Long term (current) use of anticoagulants: Secondary | ICD-10-CM

## 2014-06-14 LAB — PROTIME-INR
INR: 4.43 — ABNORMAL HIGH (ref <1.50)
Prothrombin Time: 42.2 seconds — ABNORMAL HIGH (ref 11.6–15.2)

## 2014-06-14 MED ORDER — DIGOXIN 125 MCG PO TABS: 0.1250 mg | ORAL_TABLET | Freq: Every day | ORAL | Status: DC

## 2014-06-14 MED ORDER — CARVEDILOL 12.5 MG PO TABS: ORAL_TABLET | ORAL | Status: DC

## 2014-07-07 ENCOUNTER — Other Ambulatory Visit: Payer: Self-pay | Admitting: Medical

## 2014-08-02 ENCOUNTER — Ambulatory Visit (INDEPENDENT_AMBULATORY_CARE_PROVIDER_SITE_OTHER): Payer: BC Managed Care – PPO | Admitting: Medical

## 2014-08-02 DIAGNOSIS — Z7901 Long term (current) use of anticoagulants: Secondary | ICD-10-CM

## 2014-08-02 LAB — PROTIME-INR
INR: 3.55 — AB (ref <1.50)
PROTHROMBIN TIME: 35.8 s — AB (ref 11.6–15.2)

## 2014-08-02 NOTE — Patient Instructions (Signed)
11mg  on Fridays and Mondays 10 mg all other days

## 2014-08-04 ENCOUNTER — Other Ambulatory Visit: Payer: Self-pay | Admitting: Medical

## 2014-09-09 ENCOUNTER — Other Ambulatory Visit: Payer: Self-pay | Admitting: Medical

## 2014-09-11 ENCOUNTER — Telehealth: Payer: Self-pay | Admitting: Medical

## 2014-09-11 ENCOUNTER — Other Ambulatory Visit: Payer: Self-pay | Admitting: Medical

## 2014-09-11 DIAGNOSIS — I5022 Chronic systolic (congestive) heart failure: Secondary | ICD-10-CM

## 2014-09-11 MED ORDER — DIGOXIN 125 MCG PO TABS: 0.1250 mg | ORAL_TABLET | Freq: Every day | ORAL | Status: DC

## 2014-09-11 MED ORDER — LOSARTAN POTASSIUM 100 MG PO TABS: ORAL_TABLET | ORAL | Status: DC

## 2014-09-11 NOTE — Telephone Encounter (Signed)
Requesting refill for Digoxin 0.125mg  and Losartan 100mg 

## 2014-10-06 ENCOUNTER — Ambulatory Visit (INDEPENDENT_AMBULATORY_CARE_PROVIDER_SITE_OTHER): Payer: BC Managed Care – PPO | Admitting: Medical

## 2014-10-06 ENCOUNTER — Telehealth: Payer: Self-pay | Admitting: Medical

## 2014-10-06 ENCOUNTER — Other Ambulatory Visit: Payer: Self-pay | Admitting: Medical

## 2014-10-06 DIAGNOSIS — Z7901 Long term (current) use of anticoagulants: Secondary | ICD-10-CM

## 2014-10-06 LAB — PROTIME-INR
INR: 3.48 — ABNORMAL HIGH (ref <1.50)
Prothrombin Time: 35.7 seconds — ABNORMAL HIGH (ref 11.6–15.2)

## 2014-10-06 MED ORDER — FUROSEMIDE 80 MG PO TABS: 120.0000 mg | ORAL_TABLET | Freq: Two times a day (BID) | ORAL | Status: DC

## 2014-10-06 MED ORDER — NALTREXONE-BUPROPION HCL ER 8-90 MG PO TB12: 2.0000 | ORAL_TABLET | Freq: Two times a day (BID) | ORAL | Status: DC

## 2014-10-06 MED ORDER — WARFARIN SODIUM 10 MG PO TABS: 10.0000 mg | ORAL_TABLET | Freq: Every day | ORAL | Status: DC

## 2014-10-06 NOTE — Telephone Encounter (Signed)
Requesting refill on Warfarin 10mg , Furosemide 80mg , Coreg 12.5mg 

## 2014-10-06 NOTE — Telephone Encounter (Signed)
Pt confirmed that he takes 1.5 tablets of lasix twice a day

## 2014-10-06 NOTE — Telephone Encounter (Signed)
Called pt and he verified that he does take Coreg BID and 1.5 Lasix. However he realized that he does not need a refill on Coreg afterall it was Contrave-Naltrexone Bupropion instead. So he need Warfarin 10mg , Furosemide 80mg , and ontrave-Naltrexone Bupropion.

## 2014-10-06 NOTE — Telephone Encounter (Signed)
Verify is he taking coreg BID, and is he taking 1.5 Lasix BID?  That is what chart record shows, but the lasix doesn't sound correct?

## 2014-10-06 NOTE — Telephone Encounter (Signed)
Is the lasix once daily or BID?

## 2014-10-11 ENCOUNTER — Other Ambulatory Visit (HOSPITAL_COMMUNITY): Payer: Self-pay | Admitting: Internal Medicine

## 2014-10-11 ENCOUNTER — Other Ambulatory Visit: Payer: Self-pay | Admitting: Medical

## 2014-11-21 ENCOUNTER — Encounter: Payer: Self-pay | Admitting: Family Medicine

## 2014-11-21 ENCOUNTER — Ambulatory Visit (HOSPITAL_COMMUNITY)
Admission: RE | Admit: 2014-11-21 | Discharge: 2014-11-21 | Disposition: A | Discharge status: Home / Self Care | Payer: BC Managed Care – PPO | Source: Ambulatory Visit | Attending: Family Medicine | Admitting: Family Medicine

## 2014-11-21 ENCOUNTER — Ambulatory Visit (INDEPENDENT_AMBULATORY_CARE_PROVIDER_SITE_OTHER): Payer: BC Managed Care – PPO | Admitting: Family Medicine

## 2014-11-21 VITALS — BP 116/66 | HR 89 | Temp 98.0°F | Resp 20 | Wt 357.6 lb

## 2014-11-21 DIAGNOSIS — Z86718 Personal history of other venous thrombosis and embolism: Secondary | ICD-10-CM

## 2014-11-21 DIAGNOSIS — R1030 Lower abdominal pain, unspecified: Secondary | ICD-10-CM

## 2014-11-21 DIAGNOSIS — Z7901 Long term (current) use of anticoagulants: Secondary | ICD-10-CM | POA: Diagnosis not present

## 2014-11-21 DIAGNOSIS — R1032 Left lower quadrant pain: Secondary | ICD-10-CM

## 2014-11-21 DIAGNOSIS — M79605 Pain in left leg: Secondary | ICD-10-CM | POA: Insufficient documentation

## 2014-11-21 DIAGNOSIS — M7989 Other specified soft tissue disorders: Secondary | ICD-10-CM | POA: Insufficient documentation

## 2014-11-21 LAB — PROTIME-INR
INR: 3.1 — AB (ref <1.50)
PROTHROMBIN TIME: 32.4 s — AB (ref 11.6–15.2)

## 2014-11-21 NOTE — Progress Notes (Signed)
Subjective:    Patient ID: Roberto [name], male    DOB: May 11, 1965, 49 y.o.   MRN: [id number]  HPI  He is a pleasant 49 year old male with a history of DVT, PE and chronic anticoagulation who presents with a 2 week history of left groin and left upper thigh pain, swelling, and warmth. States pain started after bending over to grab something 2 weeks ago and pain has been gradually getting worse. Reports swelling to upper thigh is the worst it has been today. Pain is worse with weight bearing, walking, and when lying on left side.   Denies chest pain, palpitations, cough or being more short of breath than usual with activity. Also denies numbness, tingling, weakness to LLE. Reports history of multiple blood clots including dvt to left leg and pulmonary embolisms. He states every time he is taken off his Coumadin that he gets a blood clot.  Takes warfarin 10 mg everyday but Wednesday, 5mg  on Wednesday. Last INR check was in September and it was 3.48.   He was taking Xerelto in past but had a lot of gas with this medication and switched back to warfarin.   Reviewed allergies, medications, past medical, surgical, social history. Past Medical History  Diagnosis Date  . Mural thrombus of heart (HCC)     coumadin  . Pulmonary embolism (HCC)     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  . Paroxysmal SVT (supraventricular tachycardia) (HCC)   . Morbid obesity (HCC)   . Arthritis   . Nonischemic cardiomyopathy (HCC)     a) 12/17/11 echo: LVEF 25-30%, grade 3 diastolic dysfunction (c/w restriction), mod MR, mod LA/LV and mild RA dilatation; b) 12/18/11 cMRI: LVEF 38%, mod LV/mild RV dilatation, global HK, mild-mod RV sys dysfxn, no LV thrombus & patchy non-subendocardial delayed enhancement c/w infil dz or prior myocarditis   . Deep vein thrombosis (HCC)   . Lupus anticoagulant positive   . Chronic combined systolic and diastolic CHF (congestive heart failure) (HCC)     Review of Systems Pertinent  positives and negatives in the history of present illness.    Objective:   Physical Exam  Constitutional: He is oriented to person, place, and time. He appears well-developed and well-nourished. No distress.  Cardiovascular: Normal rate and regular rhythm.   Pulmonary/Chest: Effort normal and breath sounds normal.  Abdominal: Soft. Bowel sounds are normal. He exhibits no distension. There is no tenderness. There is no rebound and no CVA tenderness.  Musculoskeletal: Normal range of motion.       Legs: Neurological: He is alert and oriented to person, place, and time.  Skin: Skin is warm. No pallor.  Psychiatric: He has a normal mood and affect. His behavior is normal. Judgment and thought content normal.  Vitals reviewed.     BP 116/66 mmHg  Pulse 89  Temp(Src) 98 F (36.7 C) (Temporal)  Resp 20  Wt 357 lb 9.6 oz (162.206 kg)  SpO2 95%      Assessment & Plan:  Long term current use of anticoagulant therapy - Plan: Protime-INR  Groin pain, left - Plan: VAS [place] LOWER EXTREMITY VENOUS (DVT), CANCELED: US Venous Img Lower Unilateral Left  History of DVT (deep vein thrombosis) - Plan: Protime-INR  Left inguinal pain - Plan: VAS [place] LOWER EXTREMITY VENOUS (DVT), CANCELED: US Venous Img Lower Unilateral Left  History of venous thromboembolism  Stat PT/INR ordered. Patient sent for left lower extremity ultrasound to rule out DVT. Differential diagnosis includes  DVT, musculoskeletal strain.

## 2014-11-21 NOTE — Progress Notes (Signed)
VASCULAR LAB PRELIMINARY  PRELIMINARY  PRELIMINARY  PRELIMINARY  Left lower extremity venous duplex completed.    Preliminary report:  Left:  No evidence of acute DVT, superficial thrombosis, or Baker's cyst. 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

## 2014-12-12 ENCOUNTER — Telehealth: Payer: Self-pay

## 2014-12-12 ENCOUNTER — Other Ambulatory Visit: Payer: Self-pay | Admitting: Medical

## 2014-12-12 ENCOUNTER — Other Ambulatory Visit (HOSPITAL_COMMUNITY): Payer: Self-pay | Admitting: Internal Medicine

## 2014-12-12 DIAGNOSIS — I5022 Chronic systolic (congestive) heart failure: Secondary | ICD-10-CM

## 2014-12-12 MED ORDER — FUROSEMIDE 80 MG PO TABS: 120.0000 mg | ORAL_TABLET | Freq: Two times a day (BID) | ORAL | Status: DC

## 2014-12-12 MED ORDER — SPIRONOLACTONE 25 MG PO TABS: 25.0000 mg | ORAL_TABLET | Freq: Every day | ORAL | Status: DC

## 2014-12-12 MED ORDER — POTASSIUM CHLORIDE CRYS ER 20 MEQ PO TBCR: EXTENDED_RELEASE_TABLET | ORAL | Status: DC

## 2014-12-12 NOTE — Telephone Encounter (Signed)
Verified with pt yes he is taking 1.5 tabs a day and changed quanity and sent to pharmacy

## 2014-12-12 NOTE — Telephone Encounter (Signed)
Is this okay?

## 2014-12-12 NOTE — Telephone Encounter (Signed)
Pt called saying he got a call from the pharmacy and they told him he didn't have any more refills on these meds so he is requesting more: Spirolactone 25mg  #30, Furosemide 80mg  #90, and Klor-Con M20  #60. He uses the [facility] on [address]

## 2014-12-12 NOTE — Telephone Encounter (Signed)
Verify his lasix/fursomeide dose.  Is he actually taking 1.5 talbets BID/80mg  BID?  If so resent this but change quantity to #270

## 2014-12-19 ENCOUNTER — Telehealth: Payer: Self-pay

## 2014-12-19 NOTE — Telephone Encounter (Signed)
Pt called needing a refill on his Naltrexone-Bupropion HCI ER 8-90mg  TB12 sent to [facility] on [address]

## 2014-12-20 ENCOUNTER — Other Ambulatory Visit: Payer: Self-pay | Admitting: Medical

## 2014-12-20 MED ORDER — NALTREXONE-BUPROPION HCL ER 8-90 MG PO TB12: 2.0000 | ORAL_TABLET | Freq: Two times a day (BID) | ORAL | Status: DC

## 2014-12-20 NOTE — Telephone Encounter (Signed)
LM to CB

## 2014-12-20 NOTE — Telephone Encounter (Signed)
Called back and said he will check his schedule and CB to schedule

## 2014-12-20 NOTE — Telephone Encounter (Signed)
I sent refill but he is due back for f/u on this and weight check.  Please set up f/u and coordinate with his next PT/INR check.

## 2014-12-22 NOTE — Telephone Encounter (Signed)
Scheduled for Thursday 12/28/14

## 2014-12-28 ENCOUNTER — Ambulatory Visit (INDEPENDENT_AMBULATORY_CARE_PROVIDER_SITE_OTHER): Payer: BC Managed Care – PPO | Admitting: Medical

## 2014-12-28 ENCOUNTER — Encounter: Payer: Self-pay | Admitting: Medical

## 2014-12-28 DIAGNOSIS — I5022 Chronic systolic (congestive) heart failure: Secondary | ICD-10-CM | POA: Diagnosis not present

## 2014-12-28 DIAGNOSIS — Z7901 Long term (current) use of anticoagulants: Secondary | ICD-10-CM | POA: Insufficient documentation

## 2014-12-28 DIAGNOSIS — I429 Cardiomyopathy, unspecified: Secondary | ICD-10-CM

## 2014-12-28 DIAGNOSIS — R7301 Impaired fasting glucose: Secondary | ICD-10-CM | POA: Insufficient documentation

## 2014-12-28 DIAGNOSIS — Z86718 Personal history of other venous thrombosis and embolism: Secondary | ICD-10-CM

## 2014-12-28 DIAGNOSIS — I428 Other cardiomyopathies: Secondary | ICD-10-CM

## 2014-12-28 LAB — COMPREHENSIVE METABOLIC PANEL
ALT: 11 U/L (ref 9–46)
AST: 14 U/L (ref 10–40)
Albumin: 3.4 g/dL — ABNORMAL LOW (ref 3.6–5.1)
Alkaline Phosphatase: 39 U/L — ABNORMAL LOW (ref 40–115)
BUN: 13 mg/dL (ref 7–25)
CHLORIDE: 102 mmol/L (ref 98–110)
CO2: 27 mmol/L (ref 20–31)
Calcium: 8.7 mg/dL (ref 8.6–10.3)
Creat: 1.07 mg/dL (ref 0.60–1.35)
GLUCOSE: 128 mg/dL — AB (ref 65–99)
POTASSIUM: 4.1 mmol/L (ref 3.5–5.3)
Sodium: 139 mmol/L (ref 135–146)
TOTAL PROTEIN: 7.1 g/dL (ref 6.1–8.1)
Total Bilirubin: 0.9 mg/dL (ref 0.2–1.2)

## 2014-12-28 NOTE — Patient Instructions (Signed)
Recommendations: Begin back on Contrave Take Contrave 1 tablet daily for 3 days, then 1 tablet twice daily for 3 days Then go to 2 tablets twice daily After you have been back on Contrave 2 weeks, then start the sample of Victoza injection.  Use Victoza 0.6mg  nightly for 3 nights.   Then go to Victoza 1.2mg  nightly for 3 nights Then go to Victoza 1.8mg  night til you run out  Continue your efforts with exercise and healthy diet  Use My Fitness Pal on the smart phone to track calories.  Shoot for 1200 calories per day.  After January 1st, check insurance coverage for weight loss surgery as well as coverage for the following medications for weight loss: Shona Simpson, Saxenda

## 2014-12-28 NOTE — Progress Notes (Signed)
Subjective: Chief Complaint  Patient presents with  . Follow-up    no problems or complaints.   Here for f/u on medications including Contrave for weight loss.  Had run out of Contrave 2-3 months ago.   Had leg issue recently, came here and saw Vickie.   [place] was negative for DVT.  Used heat, BenGay. After about a week, things seemed to resolve.  has had consult with bariatric clinic over a year ago.   Cost out of pocket was quoted at $3800 but he declined at that time.   currently he is exercising 4 days per week.  Goes to [employer].  Does bike and treadmill.   Treadmill 10-15 minutes.  Bike 20 minutes.   Shoots for 115 heart rate.    Compliant with Coumadin 5mg  Wednesday and Sunday, 10mg  rest of days  Has no other new c/o.  Past Medical History  Diagnosis Date  . Mural thrombus of heart (HCC)     coumadin  . Pulmonary embolism (HCC)     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  . Paroxysmal SVT (supraventricular tachycardia) (HCC)   . Morbid obesity (HCC)   . Arthritis   . Nonischemic cardiomyopathy (HCC)     a) 12/17/11 echo: LVEF 25-30%, grade 3 diastolic dysfunction (c/w restriction), mod MR, mod LA/LV and mild RA dilatation; b) 12/18/11 cMRI: LVEF 38%, mod LV/mild RV dilatation, global HK, mild-mod RV sys dysfxn, no LV thrombus & patchy non-subendocardial delayed enhancement c/w infil dz or prior myocarditis   . Deep vein thrombosis (HCC)   . Lupus anticoagulant positive   . Chronic combined systolic and diastolic CHF (congestive heart failure) (HCC)    ROS as in subjective  Objective: BP 100/60 mmHg  Pulse 56  Ht 5' 6.25" (1.683 m)  Wt 355 lb (161.027 kg)  BMI 56.85 kg/m2  Wt Readings from Last 3 Encounters:  12/28/14 355 lb (161.027 kg)  11/21/14 357 lb 9.6 oz (162.206 kg)  05/04/14 347 lb (157.398 kg)   Gen: wd, wn, nad Heart: RRR, normal S1, S2, no murmurs Lungs clear Ext: bilat lower legs with brownish coloration c/t venous stasis disease, 2+ bilat nonpitting  edema Pulses 1+ pedal    Assessment: Encounter Diagnoses  Name Primary?  . Morbid obesity, unspecified obesity type (HCC) Yes  . Long term current use of anticoagulant therapy   . Impaired fasting blood sugar   . Nonischemic cardiomyopathy (HCC)   . Chronic systolic heart failure (HCC)   . History of venous thromboembolism      Plan: Restart back on Contrave, but recommended he recheck with bariatric clinic on pricing to pursue weight loss surgery if possible.  For now, c/t efforts to increase exercise, c/t healthy diet, get back on Contrave, but begin sample of Victoza.  Discussed proper use, risks/benefits.    F/u pending labs.   Patient Instructions  Recommendations: Begin back on Contrave Take Contrave 1 tablet daily for 3 days, then 1 tablet twice daily for 3 days Then go to 2 tablets twice daily After you have been back on Contrave 2 weeks, then start the sample of Victoza injection.  Use Victoza 0.6mg  nightly for 3 nights.   Then go to Victoza 1.2mg  nightly for 3 nights Then go to Victoza 1.8mg  night til you run out  Continue your efforts with exercise and healthy diet  Use My Fitness Pal on the smart phone to track calories.  Shoot for 1200 calories per day.  After  January 1st, check insurance coverage for weight loss surgery as well as coverage for the following medications for weight loss: Shona Simpson, Caidence Higashi was seen today for follow-up.  Diagnoses and all orders for this visit:  Morbid obesity, unspecified obesity type (HCC) -     Comprehensive metabolic panel -     Hemoglobin A1c  Long term current use of anticoagulant therapy -     Protime-INR  Impaired fasting blood sugar -     Comprehensive metabolic panel -     Hemoglobin A1c  Nonischemic cardiomyopathy (HCC)  Chronic systolic heart failure (HCC)  History of venous thromboembolism -     Protime-INR

## 2014-12-29 ENCOUNTER — Other Ambulatory Visit: Payer: Self-pay | Admitting: Medical

## 2014-12-29 DIAGNOSIS — I5022 Chronic systolic (congestive) heart failure: Secondary | ICD-10-CM

## 2014-12-29 LAB — PROTIME-INR
INR: 2.77 — AB (ref <1.50)
Prothrombin Time: 29.7 seconds — ABNORMAL HIGH (ref 11.6–15.2)

## 2014-12-29 LAB — HEMOGLOBIN A1C
HEMOGLOBIN A1C: 7.1 % — AB (ref <5.7)
Mean Plasma Glucose: 157 mg/dL — ABNORMAL HIGH (ref <117)

## 2014-12-29 MED ORDER — LOSARTAN POTASSIUM 100 MG PO TABS: ORAL_TABLET | ORAL | Status: DC

## 2014-12-29 MED ORDER — CARVEDILOL 12.5 MG PO TABS: ORAL_TABLET | ORAL | Status: DC

## 2014-12-29 MED ORDER — WARFARIN SODIUM 10 MG PO TABS: 10.0000 mg | ORAL_TABLET | Freq: Every day | ORAL | Status: DC

## 2014-12-29 MED ORDER — LIRAGLUTIDE 18 MG/3ML ~~LOC~~ SOPN: 1.8000 mg | PEN_INJECTOR | Freq: Every day | SUBCUTANEOUS | Status: DC

## 2014-12-29 MED ORDER — FUROSEMIDE 80 MG PO TABS: 120.0000 mg | ORAL_TABLET | Freq: Two times a day (BID) | ORAL | Status: DC

## 2014-12-29 MED ORDER — NALTREXONE-BUPROPION HCL ER 8-90 MG PO TB12: 2.0000 | ORAL_TABLET | Freq: Two times a day (BID) | ORAL | Status: DC

## 2014-12-29 MED ORDER — SPIRONOLACTONE 25 MG PO TABS
25.0000 mg | ORAL_TABLET | Freq: Every day | ORAL | Status: DC
Start: 2014-12-29 — End: 2015-06-15

## 2014-12-29 MED ORDER — DIGOXIN 125 MCG PO TABS: 0.1250 mg | ORAL_TABLET | Freq: Every day | ORAL | Status: DC

## 2015-01-01 ENCOUNTER — Telehealth: Payer: Self-pay | Admitting: Medical

## 2015-01-01 NOTE — Telephone Encounter (Signed)
P.A. CONTRAVE  

## 2015-01-03 NOTE — Telephone Encounter (Signed)
P.A. CONTRAVE approved til 05/01/15, faxed pharmacy, Pt informed

## 2015-01-23 ENCOUNTER — Telehealth: Payer: Self-pay

## 2015-01-23 NOTE — Telephone Encounter (Signed)
Spoke with patient regarding flu vaccine.  He is schedule to come in for this.

## 2015-01-29 ENCOUNTER — Other Ambulatory Visit: Payer: Self-pay

## 2015-01-29 ENCOUNTER — Telehealth: Payer: Self-pay

## 2015-01-29 DIAGNOSIS — I5022 Chronic systolic (congestive) heart failure: Secondary | ICD-10-CM

## 2015-01-29 MED ORDER — LOSARTAN POTASSIUM 100 MG PO TABS: ORAL_TABLET | ORAL | Status: DC

## 2015-01-29 NOTE — Telephone Encounter (Signed)
(  Roberto [name] pt) He states he is out of his Losartan 100mg . There was some sent 12/29/15 to the [facility] on [address] but he didn't get it since they don't accept BCBS anymore. He needs more Losartan but he needs it sent to [facility] on [address] (in chart).

## 2015-02-12 ENCOUNTER — Telehealth: Payer: Self-pay | Admitting: Family Medicine

## 2015-02-12 NOTE — Telephone Encounter (Signed)
Pt called and states he took his last Potassium rx today.  He states [facility] said insurance will not pay for another rx until 01/18/15.  He will call his insurance and follow up

## 2015-02-16 ENCOUNTER — Ambulatory Visit (INDEPENDENT_AMBULATORY_CARE_PROVIDER_SITE_OTHER): Payer: BC Managed Care – PPO | Admitting: Medical

## 2015-02-16 DIAGNOSIS — Z86718 Personal history of other venous thrombosis and embolism: Secondary | ICD-10-CM | POA: Diagnosis not present

## 2015-02-16 DIAGNOSIS — Z7901 Long term (current) use of anticoagulants: Secondary | ICD-10-CM | POA: Diagnosis not present

## 2015-02-16 LAB — PROTIME-INR
INR: 2.45 — AB (ref <1.50)
PROTHROMBIN TIME: 27.1 s — AB (ref 11.6–15.2)

## 2015-05-07 ENCOUNTER — Other Ambulatory Visit: Payer: Self-pay | Admitting: *Deleted

## 2015-05-07 MED ORDER — POTASSIUM CHLORIDE CRYS ER 20 MEQ PO TBCR: EXTENDED_RELEASE_TABLET | ORAL | Status: DC

## 2015-05-08 ENCOUNTER — Telehealth: Payer: Self-pay | Admitting: Medical

## 2015-05-08 ENCOUNTER — Other Ambulatory Visit: Payer: Self-pay | Admitting: Medical

## 2015-05-08 MED ORDER — NALTREXONE-BUPROPION HCL ER 8-90 MG PO TB12: 2.0000 | ORAL_TABLET | Freq: Two times a day (BID) | ORAL | Status: DC

## 2015-05-08 NOTE — Telephone Encounter (Signed)
Pt needs refill on Contrave and also will need another P.A. Please refill and route back to [employer] [facility]

## 2015-05-08 NOTE — Telephone Encounter (Signed)
Sent in 12mo supply,get aproval with insurance if that is the case, and he needs f/u appt to recheck on this medication and weight loss so far.

## 2015-05-08 NOTE — Telephone Encounter (Signed)
P.A. Shona Simpson completed

## 2015-05-14 NOTE — Telephone Encounter (Signed)
Recv'd additional paperwork to complete for P.A.  Need to know if the pt has been taking Contrave for the last 4 months and what pt's weight is now and to see if he has lost 5% of body weight

## 2015-05-15 NOTE — Telephone Encounter (Signed)
Pt returned my call & states he has been taking Contrave for the past 4 months and he has lost around 30 lbs, form completed and faxed

## 2015-05-15 NOTE — Telephone Encounter (Signed)
Contrave approved til 05/15/18, faxed pharmacy, pt informed

## 2015-06-12 ENCOUNTER — Telehealth: Payer: Self-pay | Admitting: Medical

## 2015-06-12 NOTE — Telephone Encounter (Signed)
He is due for follow up on this medication.   I need to see if he is losing weight on this medication

## 2015-06-12 NOTE — Telephone Encounter (Signed)
Called pharmacy regarding new P.A. I received that was already approved and pharmacist stated, was error, only needs refill  Pleased refill Contrave

## 2015-06-13 NOTE — Telephone Encounter (Signed)
Appt made for Friday  °

## 2015-06-15 ENCOUNTER — Encounter: Payer: Self-pay | Admitting: Medical

## 2015-06-15 ENCOUNTER — Ambulatory Visit (INDEPENDENT_AMBULATORY_CARE_PROVIDER_SITE_OTHER): Payer: BC Managed Care – PPO | Admitting: Medical

## 2015-06-15 DIAGNOSIS — E876 Hypokalemia: Secondary | ICD-10-CM | POA: Diagnosis not present

## 2015-06-15 DIAGNOSIS — Z86718 Personal history of other venous thrombosis and embolism: Secondary | ICD-10-CM

## 2015-06-15 DIAGNOSIS — Z7901 Long term (current) use of anticoagulants: Secondary | ICD-10-CM

## 2015-06-15 DIAGNOSIS — B360 Pityriasis versicolor: Secondary | ICD-10-CM | POA: Diagnosis not present

## 2015-06-15 DIAGNOSIS — R7301 Impaired fasting glucose: Secondary | ICD-10-CM

## 2015-06-15 DIAGNOSIS — I5022 Chronic systolic (congestive) heart failure: Secondary | ICD-10-CM

## 2015-06-15 LAB — HEMOGLOBIN A1C
HEMOGLOBIN A1C: 6.1 % — AB (ref <5.7)
MEAN PLASMA GLUCOSE: 128 mg/dL

## 2015-06-15 LAB — BASIC METABOLIC PANEL
BUN: 14 mg/dL (ref 7–25)
CO2: 28 mmol/L (ref 20–31)
Calcium: 8.7 mg/dL (ref 8.6–10.3)
Chloride: 97 mmol/L — ABNORMAL LOW (ref 98–110)
Creat: 1.04 mg/dL (ref 0.60–1.35)
Glucose, Bld: 94 mg/dL (ref 65–99)
POTASSIUM: 3.9 mmol/L (ref 3.5–5.3)
Sodium: 136 mmol/L (ref 135–146)

## 2015-06-15 LAB — PROTIME-INR
INR: 2.35 — AB (ref <1.50)
Prothrombin Time: 26.1 seconds — ABNORMAL HIGH (ref 11.6–15.2)

## 2015-06-15 MED ORDER — CARVEDILOL 12.5 MG PO TABS: ORAL_TABLET | ORAL | Status: DC

## 2015-06-15 MED ORDER — DIGOXIN 125 MCG PO TABS: 0.1250 mg | ORAL_TABLET | Freq: Every day | ORAL | Status: DC

## 2015-06-15 MED ORDER — NALTREXONE-BUPROPION HCL ER 8-90 MG PO TB12: 2.0000 | ORAL_TABLET | Freq: Two times a day (BID) | ORAL | Status: DC

## 2015-06-15 MED ORDER — SPIRONOLACTONE 25 MG PO TABS: 25.0000 mg | ORAL_TABLET | Freq: Every day | ORAL | Status: DC

## 2015-06-15 MED ORDER — FUROSEMIDE 80 MG PO TABS: 120.0000 mg | ORAL_TABLET | Freq: Two times a day (BID) | ORAL | Status: DC

## 2015-06-15 MED ORDER — LOSARTAN POTASSIUM 100 MG PO TABS: ORAL_TABLET | ORAL | Status: DC

## 2015-06-15 MED ORDER — KETOCONAZOLE 2 % EX SHAM: 1.0000 "application " | MEDICATED_SHAMPOO | CUTANEOUS | Status: DC

## 2015-06-15 NOTE — Progress Notes (Signed)
Subjective: Chief Complaint  Patient presents with  . Follow-up    contrave refills. wants to discuss getting off of some medications. has a rash on arm Roberto [name] wants looked at   Here for f/u on obesity.  Roberto [name] finally has found something that works.   Is compliant with the Contrave.  For months has been using the 2 tablets BID since January.  Cost $30 per script.    Exercise - working on 60-70% target heart rate, walking on treadmill and using bike, doing lots of reps with the weights.  Goes to gym 4-5 days per week.    Diet - eats protein shake in the morning, fruit snack in mid morning, lunch exercises.  Does another protein shake after lunch exercise.  In the evening is careful with supper.  Gets protein and carb, lots of salads.  Has cut back on soda.    Coumadin - 10mg  daily except Sunday and Wednesday.  Has plenty of tablets left.  No bleeding, no bruising.    Not currently checking glucose.   Roberto [name] reports rash on right upper arm medially and neck.     Past Medical History  Diagnosis Date  . Mural thrombus of heart (HCC)     coumadin  . Pulmonary embolism (HCC)     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  . Paroxysmal SVT (supraventricular tachycardia) (HCC)   . Morbid obesity (HCC)   . Arthritis   . Nonischemic cardiomyopathy (HCC)     a) 12/17/11 echo: LVEF 25-30%, grade 3 diastolic dysfunction (c/w restriction), mod MR, mod LA/LV and mild RA dilatation; b) 12/18/11 cMRI: LVEF 38%, mod LV/mild RV dilatation, global HK, mild-mod RV sys dysfxn, no LV thrombus & patchy non-subendocardial delayed enhancement c/w infil dz or prior myocarditis   . Deep vein thrombosis (HCC)   . Lupus anticoagulant positive   . Chronic combined systolic and diastolic CHF (congestive heart failure) (HCC)    ROS as in subjective   Objective: BP 106/68 mmHg  Pulse 78  Ht 5' 6.25" (1.683 m)  Wt 319 lb (144.697 kg)  BMI 51.08 kg/m2  Wt Readings from Last 3 Encounters:  06/15/15 319 lb (144.697 kg)   12/28/14 355 lb (161.027 kg)  11/21/14 357 lb 9.6 oz (162.206 kg)   Gen: wd, wn, nad Heart: RRR, normal S1, S2, no murmurs Lungs clear Ext: bilat lower legs with brownish coloration c/t venous stasis disease, 2+ bilat nonpitting edema Pulses 1+ pedal   Assessment: Encounter Diagnoses  Name Primary?  . Morbid obesity, unspecified obesity type (HCC) Yes  . History of venous thromboembolism   . Long term current use of anticoagulant therapy   . Impaired fasting blood sugar     Plan: Glad to see Roberto [name] is getting results with weight loss with the Contrave.  C/t Contrave 2 tablets BID, c/t current medications, labs today as below.   Discussed diet, exercise, weight loss goals.   Refilled medications.   [place] was seen today for follow-up.  Diagnoses and all orders for this visit:  Morbid obesity, unspecified obesity type (HCC) -     Hemoglobin A1c  History of venous thromboembolism -     Protime-INR  Long term current use of anticoagulant therapy -     Protime-INR -     Basic metabolic panel  Impaired fasting blood sugar -     Protime-INR -     Hemoglobin A1c  Tinea versicolor  Chronic systolic heart failure (HCC) -  losartan (COZAAR) 100 MG tablet; TAKE ONE TABLET BY MOUTH ONCE DAILY -     spironolactone (ALDACTONE) 25 MG tablet; Take 1 tablet (25 mg total) by mouth daily.  Hypokalemia  Other orders -     Naltrexone-Bupropion HCl ER 8-90 MG TB12; Take 2 tablets by mouth 2 (two) times daily with a meal. -     ketoconazole (NIZORAL) 2 % shampoo; Apply 1 application topically 2 (two) times a week. -     carvedilol (COREG) 12.5 MG tablet; TAKE ONE TABLET BY MOUTH TWICE DAILY WITH MEALS -     furosemide (LASIX) 80 MG tablet; Take 1.5 tablets (120 mg total) by mouth 2 (two) times daily. -     digoxin (LANOXIN) 0.125 MG tablet; Take 1 tablet (0.125 mg total) by mouth daily.

## 2015-08-27 ENCOUNTER — Other Ambulatory Visit: Payer: Self-pay | Admitting: Family Medicine

## 2015-09-18 ENCOUNTER — Ambulatory Visit (INDEPENDENT_AMBULATORY_CARE_PROVIDER_SITE_OTHER): Payer: BC Managed Care – PPO | Admitting: Medical

## 2015-09-18 ENCOUNTER — Encounter: Payer: Self-pay | Admitting: Internal Medicine

## 2015-09-18 ENCOUNTER — Encounter: Payer: Self-pay | Admitting: Medical

## 2015-09-18 VITALS — BP 126/80 | HR 69 | Resp 18 | Ht 68.0 in | Wt 308.8 lb

## 2015-09-18 DIAGNOSIS — Z86718 Personal history of other venous thrombosis and embolism: Secondary | ICD-10-CM

## 2015-09-18 DIAGNOSIS — I5022 Chronic systolic (congestive) heart failure: Secondary | ICD-10-CM | POA: Diagnosis not present

## 2015-09-18 DIAGNOSIS — Z1211 Encounter for screening for malignant neoplasm of colon: Secondary | ICD-10-CM

## 2015-09-18 DIAGNOSIS — I8390 Asymptomatic varicose veins of unspecified lower extremity: Secondary | ICD-10-CM

## 2015-09-18 DIAGNOSIS — R7301 Impaired fasting glucose: Secondary | ICD-10-CM

## 2015-09-18 DIAGNOSIS — Z7901 Long term (current) use of anticoagulants: Secondary | ICD-10-CM | POA: Diagnosis not present

## 2015-09-18 DIAGNOSIS — I868 Varicose veins of other specified sites: Secondary | ICD-10-CM

## 2015-09-18 DIAGNOSIS — Z23 Encounter for immunization: Secondary | ICD-10-CM

## 2015-09-18 DIAGNOSIS — I83009 Varicose veins of unspecified lower extremity with ulcer of unspecified site: Secondary | ICD-10-CM | POA: Insufficient documentation

## 2015-09-18 DIAGNOSIS — L97909 Non-pressure chronic ulcer of unspecified part of unspecified lower leg with unspecified severity: Secondary | ICD-10-CM

## 2015-09-18 LAB — LIPID PANEL
Cholesterol: 107 mg/dL — ABNORMAL LOW (ref 125–200)
HDL: 40 mg/dL (ref >=40)
LDL CALC: 53 mg/dL (ref <130)
TRIGLYCERIDES: 68 mg/dL (ref <150)
Total CHOL/HDL Ratio: 2.7 Ratio (ref <=5.0)
VLDL: 14 mg/dL (ref <30)

## 2015-09-18 MED ORDER — NALTREXONE-BUPROPION HCL ER 8-90 MG PO TB12: ORAL_TABLET | ORAL | 3 refills | Status: DC

## 2015-09-18 NOTE — Progress Notes (Signed)
Subjective: Chief Complaint  Patient presents with  . Medication Management    here for f/u, medication refills on Contrave- recheck INR    Here for routine f/u.  Here for lab f/u on Coumadin.  Still taking 10mg  Coumadin on all days except Sunday and Wednesday  Here for weight loss medication f/u.   Still exercising regularly, eating healthy.  Has lost some additional weight, Contrave works great to help with appetite and weight loss.   unfortunately since last visit he and wife separated.   This has freed up some time for him to get some exercise and to focus on cooking for one, and healthy diet.   Still working 2 jobs.     No other c/o.  Past Medical History:  Diagnosis Date  . Arthritis   . Chronic combined systolic and diastolic CHF (congestive heart failure) (HCC)   . Deep vein thrombosis (HCC)   . Lupus anticoagulant positive   . Morbid obesity (HCC)   . Mural thrombus of heart (HCC)    coumadin  . Nonischemic cardiomyopathy (HCC)    a) 12/17/11 echo: LVEF 25-30%, grade 3 diastolic dysfunction (c/w restriction), mod MR, mod LA/LV and mild RA dilatation; b) 12/18/11 cMRI: LVEF 38%, mod LV/mild RV dilatation, global HK, mild-mod RV sys dysfxn, no LV thrombus & patchy non-subendocardial delayed enhancement c/w infil dz or prior myocarditis   . Paroxysmal SVT (supraventricular tachycardia) (HCC)   . Pulmonary embolism (HCC)    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   Current Outpatient Prescriptions on File Prior to Visit  Medication Sig Dispense Refill  . carvedilol (COREG) 12.5 MG tablet TAKE ONE TABLET BY MOUTH TWICE DAILY WITH MEALS 180 tablet 1  . digoxin (LANOXIN) 0.125 MG tablet Take 1 tablet (0.125 mg total) by mouth daily. 90 tablet 1  . furosemide (LASIX) 80 MG tablet Take 1.5 tablets (120 mg total) by mouth 2 (two) times daily. 270 tablet 5  . ketoconazole (NIZORAL) 2 % shampoo Apply 1 application topically 2 (two) times a week. 120 mL 2  . losartan (COZAAR) 100 MG tablet  TAKE ONE TABLET BY MOUTH ONCE DAILY 90 tablet 1  . Naltrexone-Bupropion HCl ER 8-90 MG TB12 Take 2 tablets by mouth 2 (two) times daily with a meal. 120 tablet 11  . potassium chloride SA (K-DUR,KLOR-CON) 20 MEQ tablet TAKE 1 TABLET BY MOUTH TWICE DAILY 180 tablet 0  . spironolactone (ALDACTONE) 25 MG tablet Take 1 tablet (25 mg total) by mouth daily. 90 tablet 1  . warfarin (COUMADIN) 10 MG tablet Take 1 tablet (10 mg total) by mouth daily at 6 PM. 90 tablet 1  . sildenafil (VIAGRA) 100 MG tablet Take 1 tablet (100 mg total) by mouth daily as needed for erectile dysfunction. (Patient not taking: Reported on 12/28/2014) 10 tablet 1   No current facility-administered medications on file prior to visit.    ROS as in subjective   Objective: BP 126/80   Pulse 69   Resp 18   Ht 5\' 8"  (1.727 m)   Wt (!) 308 lb 12.8 oz (140.1 kg)   SpO2 98%   BMI 46.95 kg/m   Wt Readings from Last 3 Encounters:  09/18/15 (!) 308 lb 12.8 oz (140.1 kg)  06/15/15 (!) 319 lb (144.7 kg)  12/28/14 (!) 355 lb (161 kg)   Gen: wd, wn, nad Heart: normal S1, s2, no murmurs Lungs clear Varicose veins, mild to moderate of bilat lower legs Ext: 1+ nonpitting  LE edema bilat Legs neurovascularly intact    Assessment: Encounter Diagnoses  Name Primary?  . Impaired fasting blood sugar Yes  . Morbid obesity, unspecified obesity type (HCC)   . Long term current use of anticoagulant therapy   . History of venous thromboembolism   . Chronic systolic heart failure (HCC)   . Need for prophylactic vaccination and inoculation against influenza   . Varicose veins   . Screen for colon cancer     Plan: Labs today Glad to see he is still seeing weight loss. C/t weight loss efforts, healthy diet, routine exercise. C/t Contrave, also sample of Contrave given today, #70 tablet sample bottle.   PT/INR lab today, c/t coumadin C/t rest of medications as usual Counseled on the influenza virus vaccine.  Vaccine information  sheet given.  Influenza vaccine given after consent obtained. Referral for first screening colonoscopy He defers prostate check but will come in soon for physical and prostate exam.   Roberto [name] was seen today for medication management.  Diagnoses and all orders for this visit:  Impaired fasting blood sugar -     Hemoglobin A1c -     Lipid panel  Morbid obesity, unspecified obesity type (HCC) -     Hemoglobin A1c -     Lipid panel  Long term current use of anticoagulant therapy -     Protime-INR  History of venous thromboembolism -     Protime-INR  Chronic systolic heart failure (HCC) -     Protime-INR  Need for prophylactic vaccination and inoculation against influenza -     Flu Vaccine QUAD 36+ mos PF IM (Fluarix & Fluzone Quad PF)  Varicose veins  Screen for colon cancer -     Ambulatory referral to Gastroenterology

## 2015-09-19 LAB — HEMOGLOBIN A1C
HEMOGLOBIN A1C: 5.9 % — AB (ref <5.7)
Mean Plasma Glucose: 123 mg/dL

## 2015-09-19 LAB — PROTIME-INR
INR: 1.9 — ABNORMAL HIGH
Prothrombin Time: 20.1 s — ABNORMAL HIGH (ref 9.0–11.5)

## 2015-10-05 ENCOUNTER — Other Ambulatory Visit: Payer: Self-pay | Admitting: Medical

## 2015-10-05 NOTE — Telephone Encounter (Signed)
Pt is to recheck in 1 month from prior lab results

## 2015-10-18 ENCOUNTER — Encounter: Payer: Self-pay | Admitting: Internal Medicine

## 2015-10-18 ENCOUNTER — Encounter: Payer: Self-pay | Admitting: Medical

## 2015-10-18 ENCOUNTER — Telehealth: Payer: Self-pay | Admitting: Medical

## 2015-10-18 NOTE — Telephone Encounter (Signed)
Please call patient and inquire.  I received notification from gastroenterology that he either no showed or wasn't able to be contacted for their appointment in which we referred them.   Please make sure they are in agreement for the referral, and make sure they are aware that when we refer to a specialist, no showing or not being able to be reached can result in the specialist not taking them as a patient, make cause them to incur no show fees, and is just not appropriate in general to us or them.  Please either have them call back to set up the appointment or document their reply. 

## 2015-10-18 NOTE — Telephone Encounter (Signed)
As Corinda Gubler has tried to call pt and has left a message for pt to schedule an appointment for colonoscopy and Sabrina mailing a letter I have also left a message for pt that they have tried to contact him to make appointment and if he still was going to go through with getting colonoscopy to please call them or [place] to let us know

## 2015-11-21 ENCOUNTER — Other Ambulatory Visit: Payer: Self-pay | Admitting: Medical

## 2015-12-04 ENCOUNTER — Ambulatory Visit (INDEPENDENT_AMBULATORY_CARE_PROVIDER_SITE_OTHER): Payer: BC Managed Care – PPO | Admitting: Medical

## 2015-12-04 DIAGNOSIS — Z7901 Long term (current) use of anticoagulants: Secondary | ICD-10-CM

## 2015-12-04 LAB — PROTIME-INR
INR: 2.3 — AB
PROTHROMBIN TIME: 23.6 s — AB (ref 9.0–11.5)

## 2015-12-05 ENCOUNTER — Other Ambulatory Visit: Payer: Self-pay | Admitting: Medical

## 2015-12-05 DIAGNOSIS — I5022 Chronic systolic (congestive) heart failure: Secondary | ICD-10-CM

## 2015-12-05 MED ORDER — SPIRONOLACTONE 25 MG PO TABS: 25.0000 mg | ORAL_TABLET | Freq: Every day | ORAL | 1 refills | Status: DC

## 2015-12-05 NOTE — Telephone Encounter (Signed)
Pt requested refills on his meds. shane is aware

## 2015-12-17 ENCOUNTER — Ambulatory Visit (INDEPENDENT_AMBULATORY_CARE_PROVIDER_SITE_OTHER): Payer: BC Managed Care – PPO | Admitting: Medical

## 2015-12-17 ENCOUNTER — Encounter: Payer: Self-pay | Admitting: Medical

## 2015-12-17 VITALS — BP 110/70 | HR 81 | Ht 65.0 in | Wt 315.0 lb

## 2015-12-17 DIAGNOSIS — Z7901 Long term (current) use of anticoagulants: Secondary | ICD-10-CM

## 2015-12-17 DIAGNOSIS — I471 Supraventricular tachycardia, unspecified: Secondary | ICD-10-CM

## 2015-12-17 DIAGNOSIS — I872 Venous insufficiency (chronic) (peripheral): Secondary | ICD-10-CM

## 2015-12-17 DIAGNOSIS — Z0000 Encounter for general adult medical examination without abnormal findings: Secondary | ICD-10-CM

## 2015-12-17 DIAGNOSIS — R760 Raised antibody titer: Secondary | ICD-10-CM

## 2015-12-17 DIAGNOSIS — M1711 Unilateral primary osteoarthritis, right knee: Secondary | ICD-10-CM

## 2015-12-17 DIAGNOSIS — Z86718 Personal history of other venous thrombosis and embolism: Secondary | ICD-10-CM | POA: Diagnosis not present

## 2015-12-17 DIAGNOSIS — Z1211 Encounter for screening for malignant neoplasm of colon: Secondary | ICD-10-CM

## 2015-12-17 DIAGNOSIS — I8393 Asymptomatic varicose veins of bilateral lower extremities: Secondary | ICD-10-CM

## 2015-12-17 DIAGNOSIS — Z125 Encounter for screening for malignant neoplasm of prostate: Secondary | ICD-10-CM

## 2015-12-17 DIAGNOSIS — R7301 Impaired fasting glucose: Secondary | ICD-10-CM | POA: Diagnosis not present

## 2015-12-17 DIAGNOSIS — I428 Other cardiomyopathies: Secondary | ICD-10-CM | POA: Diagnosis not present

## 2015-12-17 DIAGNOSIS — N529 Male erectile dysfunction, unspecified: Secondary | ICD-10-CM | POA: Diagnosis not present

## 2015-12-17 DIAGNOSIS — M21961 Unspecified acquired deformity of right lower leg: Secondary | ICD-10-CM

## 2015-12-17 DIAGNOSIS — Z7185 Encounter for immunization safety counseling: Secondary | ICD-10-CM

## 2015-12-17 DIAGNOSIS — Z7189 Other specified counseling: Secondary | ICD-10-CM

## 2015-12-17 DIAGNOSIS — R269 Unspecified abnormalities of gait and mobility: Secondary | ICD-10-CM | POA: Insufficient documentation

## 2015-12-17 DIAGNOSIS — I5022 Chronic systolic (congestive) heart failure: Secondary | ICD-10-CM | POA: Diagnosis not present

## 2015-12-17 LAB — POCT URINALYSIS DIPSTICK
Bilirubin, UA: NEGATIVE
Blood, UA: NEGATIVE
Glucose, UA: NEGATIVE
KETONES UA: NEGATIVE
LEUKOCYTES UA: NEGATIVE
NITRITE UA: NEGATIVE
PH UA: 6
PROTEIN UA: NEGATIVE
Spec Grav, UA: 1.02
UROBILINOGEN UA: NEGATIVE

## 2015-12-17 LAB — COMPREHENSIVE METABOLIC PANEL
ALT: 11 U/L (ref 9–46)
AST: 15 U/L (ref 10–35)
Albumin: 3.6 g/dL (ref 3.6–5.1)
Alkaline Phosphatase: 39 U/L — ABNORMAL LOW (ref 40–115)
BUN: 17 mg/dL (ref 7–25)
CHLORIDE: 101 mmol/L (ref 98–110)
CO2: 30 mmol/L (ref 20–31)
CREATININE: 1.04 mg/dL (ref 0.70–1.33)
Calcium: 8.6 mg/dL (ref 8.6–10.3)
Glucose, Bld: 114 mg/dL — ABNORMAL HIGH (ref 65–99)
Potassium: 4.2 mmol/L (ref 3.5–5.3)
SODIUM: 138 mmol/L (ref 135–146)
TOTAL PROTEIN: 7.2 g/dL (ref 6.1–8.1)
Total Bilirubin: 0.6 mg/dL (ref 0.2–1.2)

## 2015-12-17 LAB — CBC
HCT: 42.4 % (ref 38.5–50.0)
Hemoglobin: 14 g/dL (ref 13.2–17.1)
MCH: 28.9 pg (ref 27.0–33.0)
MCHC: 33 g/dL (ref 32.0–36.0)
MCV: 87.6 fL (ref 80.0–100.0)
MPV: 10.9 fL (ref 7.5–12.5)
PLATELETS: 234 10*3/uL (ref 140–400)
RBC: 4.84 MIL/uL (ref 4.20–5.80)
RDW: 13.8 % (ref 11.0–15.0)
WBC: 10.6 10*3/uL — ABNORMAL HIGH (ref 4.0–10.5)

## 2015-12-17 MED ORDER — FUROSEMIDE 80 MG PO TABS: 120.0000 mg | ORAL_TABLET | Freq: Two times a day (BID) | ORAL | 5 refills | Status: DC

## 2015-12-17 MED ORDER — CARVEDILOL 12.5 MG PO TABS: ORAL_TABLET | ORAL | 3 refills | Status: DC

## 2015-12-17 MED ORDER — SPIRONOLACTONE 25 MG PO TABS: 25.0000 mg | ORAL_TABLET | Freq: Every day | ORAL | 3 refills | Status: DC

## 2015-12-17 MED ORDER — PRAVASTATIN SODIUM 20 MG PO TABS: 20.0000 mg | ORAL_TABLET | Freq: Every day | ORAL | 3 refills | Status: DC

## 2015-12-17 MED ORDER — LOSARTAN POTASSIUM 100 MG PO TABS: ORAL_TABLET | ORAL | 3 refills | Status: DC

## 2015-12-17 MED ORDER — DIGOXIN 125 MCG PO TABS: ORAL_TABLET | ORAL | 3 refills | Status: DC

## 2015-12-17 MED ORDER — POTASSIUM CHLORIDE CRYS ER 20 MEQ PO TBCR: 20.0000 meq | EXTENDED_RELEASE_TABLET | Freq: Two times a day (BID) | ORAL | 3 refills | Status: DC

## 2015-12-17 MED ORDER — NALTREXONE-BUPROPION HCL ER 8-90 MG PO TB12: 2.0000 | ORAL_TABLET | Freq: Two times a day (BID) | ORAL | 1 refills | Status: DC

## 2015-12-17 NOTE — Patient Instructions (Addendum)
  Thank you for giving me the opportunity to serve you today.    Recommendations:  Follow up as planned for colonoscopy  We did a prostate cancer screen today  See your eye doctor yearly for routine vision care.  See your dentist yearly for routine dental care including hygiene visits twice yearly.  continue routine exercise, healthy diet  Continue current medications  We will call with lab results  Continue efforts to lose weight  I recommend you begin Pravachol daily at bedtime daily to reduce heart disease risk

## 2015-12-17 NOTE — Progress Notes (Signed)
Subjective: Chief Complaint  Patient presents with  . physical    physical   He notes having some fluttering of the heart yesterday.  Hasn't had this in a while.  Has had 2 recently, one recent one lasted all day but last time this happened was 2 years ago.  Has had some headaches of late.  Last cardiology visit has been a while, several months ago.  Has been doing a little more weights than cardio lately.  Has been doing well with weight loss until recently may have gained a few pounds back.   Has initial consult with [facility] this week about colonoscopy.    He will call insurance about coverage for pneumococcal vaccine and shingles vaccine.   Has right knee OA and can't get TKR until he loses enough weight.  Hx/o left knee surgery  Reviewed their medical, surgical, family, social, medication, and allergy history and updated chart as appropriate.  Past Medical History:  Diagnosis Date  . Arthritis   . Chronic combined systolic and diastolic CHF (congestive heart failure) (HCC)   . Deep vein thrombosis (HCC)   . Lupus anticoagulant positive   . Morbid obesity (HCC)   . Mural thrombus of heart    coumadin  . Nonischemic cardiomyopathy (HCC)    a) 12/17/11 echo: LVEF 25-30%, grade 3 diastolic dysfunction (c/w restriction), mod MR, mod LA/LV and mild RA dilatation; b) 12/18/11 cMRI: LVEF 38%, mod LV/mild RV dilatation, global HK, mild-mod RV sys dysfxn, no LV thrombus & patchy non-subendocardial delayed enhancement c/w infil dz or prior myocarditis   . Paroxysmal SVT (supraventricular tachycardia) (HCC)   . 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 CARDIAC CATHETERIZATION  06/2006   Angiographically normal cors  . CARDIAC CATHETERIZATION  12/22/2011   R/LHC: normal cors, well-compensated HDs, LV dysfxn  . LEFT AND RIGHT HEART CATHETERIZATION WITH CORONARY ANGIOGRAM N/A 12/22/2011   Procedure: LEFT AND  RIGHT HEART CATHETERIZATION WITH CORONARY ANGIOGRAM;  Surgeon: Dolores Patty, MD;  Location: [facility] CATH LAB;  Service: Cardiovascular;  Laterality: N/A;  . PATELLAR TENDON REPAIR     Left    Social History   Social History  . Marital status: Married    Spouse name: N/A  . Number of children: 5  . Years of education: N/A   Occupational History  . Oceanographer for [employer]   Social History Main Topics  . Smoking status: Never Smoker  . Smokeless tobacco: Never Used  . Alcohol use No     Comment: Rarely  . Drug use: No  . Sexual activity: Not on file   Other Topics Concern  . Not on file   Social History Narrative   Lives in [place] 03/2015, 5 children.   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  Exercises with weights and some walking. 12/2015    Family History  Problem Relation Age of Onset  . Hypertension Mother   . Clotting disorder Mother     mom with PEs  . Cancer Mother     uterine cancer  . Diabetes Father   . Heart attack Father   . Hypertension Other     Sibling  . Diabetes Other   . Obesity Other   . Diabetes Sister   . Obesity Sister   . Obesity Sister  Current Outpatient Prescriptions:  .  carvedilol (COREG) 12.5 MG tablet, TAKE ONE TABLET BY MOUTH TWICE DAILY WITH MEALS, Disp: 180 tablet, Rfl: 1 .  DIGOX 125 MCG tablet, TAKE 1 TABLET(0.125 MG) BY MOUTH DAILY, Disp: 90 tablet, Rfl: 0 .  furosemide (LASIX) 80 MG tablet, Take 1.5 tablets (120 mg total) by mouth 2 (two) times daily., Disp: 270 tablet, Rfl: 5 .  losartan (COZAAR) 100 MG tablet, TAKE ONE TABLET BY MOUTH ONCE DAILY, Disp: 90 tablet, Rfl: 1 .  Naltrexone-Bupropion HCl ER 8-90 MG TB12, Take 2 tablets by mouth 2 (two) times daily., Disp: , Rfl:  .  potassium chloride SA (K-DUR,KLOR-CON) 20 MEQ tablet, TAKE 1 TABLET BY MOUTH TWICE DAILY, Disp: 180 tablet, Rfl: 0 .  spironolactone (ALDACTONE) 25 MG tablet,  Take 1 tablet (25 mg total) by mouth daily., Disp: 90 tablet, Rfl: 1 .  warfarin (COUMADIN) 10 MG tablet, TAKE 1 TABLET BY MOUTH DAILY AT 6PM, Disp: 90 tablet, Rfl: 0  Allergies  Allergen Reactions  . Shrimp [Shellfish Allergy] Hives and Itching    Review of Systems Constitutional: -fever, -chills, -sweats, -unexpected weight change, -decreased appetite, -fatigue Allergy: -sneezing, -itching, -congestion Dermatology: -changing moles, --rash, -lumps ENT: -runny nose, -ear pain, -sore throat, -hoarseness, -sinus pain, -teeth pain, - ringing in ears, -hearing loss, -nosebleeds Cardiology: -chest pain, +palpitations, -swelling, -difficulty breathing when lying flat, -waking up short of breath Respiratory: -cough, -shortness of breath, -difficulty breathing with exercise or exertion, -wheezing, -coughing up blood Gastroenterology: -abdominal pain, -nausea, -vomiting, -diarrhea, -constipation, -blood in stool, -changes in bowel movement, -difficulty swallowing or eating Hematology: -bleeding, -bruising  Musculoskeletal: -joint aches, -muscle aches, -joint swelling, -back pain, -neck pain, -cramping, -changes in gait Ophthalmology: denies vision changes, eye redness, itching, discharge Urology: -burning with urination, -difficulty urinating, -blood in urine, -urinary frequency, -urgency, -incontinence Neurology: -headache, -weakness, -tingling, -numbness, -memory loss, -falls, -dizziness Psychology: -depressed mood, -agitation, -sleep problems     Objective:   Physical Exam BP 110/70   Pulse 81   Ht 5\' 5"  (1.651 m)   Wt (!) 315 lb (142.9 kg)   SpO2 96%   BMI 52.42 kg/m    General appearance: alert, no distress, WD/WN, obese AA male Skin: left lower leg from below knee to foot with dark brownish coloration throughout c/w chronic venous insufficiency, scattered few macules, right volar distal thumb with oval 4mm x 2 mm brown flat macule unchanged for years HEENT: normocephalic,  conjunctiva/corneas normal, sclerae anicteric, PERRLA, EOMi, nares patent, no discharge or erythema, pharynx normal Oral cavity: MMM, tongue normal, teeth in good repair Neck: supple, no lymphadenopathy, no thyromegaly, no masses, normal ROM, no bruits Chest: non tender, normal shape and expansion Heart: RRR, normal S1, S2, no murmurs Lungs: CTA bilaterally, no wheezes, rhonchi, or rales Abdomen: +bs, soft, non tender, non distended, no masses, no hepatomegaly, no splenomegaly, no bruits Back: non tender, normal ROM, no scoliosis Musculoskeletal: right knee buckles inward for years, causing some gait impairment, left knee anterior surgical scar, otherwise upper extremities non tender, no obvious deformity, normal ROM throughout, lower extremities non tender, no obvious deformity, normal ROM throughout Extremities: left lower leg with mild edema, asymmetrically and chronically larger diameter than right lower leg, otherwise no cyanosis, no clubbing, +bilat LE varicose veins, mild to moderate Pulses: 2+ symmetric, upper and lower extremities, normal cap refill Neurological: alert, oriented x 3, CN2-12 intact, strength normal upper extremities and lower extremities, sensation normal throughout, DTRs 2+ throughout, no cerebellar signs, gait normal  Psychiatric: normal affect, behavior normal, pleasant  GU: normal male external genitalia, circumccised, nontender, no masses, no hernia, no lymphadenopathy Rectal: anus nontender, normal tone, prostate WNL   Adult ECG Report  Indication: palpitations   Rate: 75 bpm  Rhythm: normal sinus rhythm  QRS Axis: -51 degrees  PR Interval:  QRS Duration: 50ms  QTc:  Conduction Disturbances: left atrial enlargement, left anterior fascicular block  Other Abnormalities: none  Patient's cardiac risk factors are: dyslipidemia, hypertension, male gender and obesity (BMI >= 30 kg/m2).  EKG comparison: 01/2012  Narrative Interpretation: left atrial  enlargement, left anterior fascicular block, no changes compared to 01/2012 EKG   Assessment and Plan :    Encounter Diagnoses  Name Primary?  . Encounter for health maintenance examination in adult Yes  . Chronic systolic heart failure (HCC)   . Nonischemic cardiomyopathy (HCC)   . Paroxysmal SVT (supraventricular tachycardia) (HCC)   . Venous insufficiency   . Impaired fasting blood sugar   . Erectile dysfunction, unspecified erectile dysfunction type   . Lupus anticoagulant positive   . OBESITY, MORBID   . Long term current use of anticoagulant therapy   . History of venous thromboembolism   . Varicose veins of legs   . Special screening for malignant neoplasms, colon   . Vaccine counseling   . Screening for prostate cancer   . Acquired deformity of right knee   . Impaired gait   . Osteoarthritis of right knee, unspecified osteoarthritis type     Physical exam - discussed healthy lifestyle, diet, exercise, preventative care, vaccinations, and addressed their concerns.   EKG reviewed, reviewed 03/2014 echo.  Pending labs, will consider recheck with cardiolog.  He has been compliant with anticoagulation.  Recommendations:  Follow up as planned for colonoscopy  We did a prostate cancer screen today  See your eye doctor yearly for routine vision care.  See your dentist yearly for routine dental care including hygiene visits twice yearly.  continue routine exercise, healthy diet  Continue current medications  We will call with lab results  Continue efforts to lose weight  I recommend you begin Pravachol daily at bedtime daily to reduce heart disease risk  Follow-up pending labs    Roberto [name] was seen today for physical.  Diagnoses and all orders for this visit:  Encounter for health maintenance examination in adult -     PSA -     CBC -     Comprehensive metabolic panel -     EKG 12-Lead -     Urinalysis Dipstick  Chronic systolic heart failure (HCC) -      losartan (COZAAR) 100 MG tablet; TAKE ONE TABLET BY MOUTH ONCE DAILY -     spironolactone (ALDACTONE) 25 MG tablet; Take 1 tablet (25 mg total) by mouth daily.  Nonischemic cardiomyopathy (HCC)  Paroxysmal SVT (supraventricular tachycardia) (HCC) -     EKG 12-Lead  Venous insufficiency  Impaired fasting blood sugar  Erectile dysfunction, unspecified erectile dysfunction type  Lupus anticoagulant positive  OBESITY, MORBID  Long term current use of anticoagulant therapy  History of venous thromboembolism  Varicose veins of legs  Special screening for malignant neoplasms, colon  Vaccine counseling  Screening for prostate cancer -     PSA  Acquired deformity of right knee  Impaired gait  Osteoarthritis of right knee, unspecified osteoarthritis type  Other orders -     potassium chloride SA (K-DUR,KLOR-CON) 20 MEQ tablet; Take 1 tablet (20 mEq total) by  mouth 2 (two) times daily. -     digoxin (DIGOX) 0.125 MG tablet; TAKE 1 TABLET(0.125 MG) BY MOUTH DAILY -     carvedilol (COREG) 12.5 MG tablet; TAKE ONE TABLET BY MOUTH TWICE DAILY WITH MEALS -     furosemide (LASIX) 80 MG tablet; Take 1.5 tablets (120 mg total) by mouth 2 (two) times daily. -     Naltrexone-Bupropion HCl ER 8-90 MG TB12; Take 2 tablets by mouth 2 (two) times daily. -     pravastatin (PRAVACHOL) 20 MG tablet; Take 1 tablet (20 mg total) by mouth daily.

## 2015-12-18 LAB — PSA: PSA: 0.5 ng/mL (ref <=4.0)

## 2015-12-19 ENCOUNTER — Ambulatory Visit (INDEPENDENT_AMBULATORY_CARE_PROVIDER_SITE_OTHER): Payer: BC Managed Care – PPO | Admitting: Internal Medicine

## 2015-12-19 ENCOUNTER — Other Ambulatory Visit: Payer: Self-pay | Admitting: Medical

## 2015-12-19 ENCOUNTER — Encounter: Payer: Self-pay | Admitting: Internal Medicine

## 2015-12-19 DIAGNOSIS — I428 Other cardiomyopathies: Secondary | ICD-10-CM | POA: Diagnosis not present

## 2015-12-19 DIAGNOSIS — Z1211 Encounter for screening for malignant neoplasm of colon: Secondary | ICD-10-CM | POA: Diagnosis not present

## 2015-12-19 DIAGNOSIS — Z7901 Long term (current) use of anticoagulants: Secondary | ICD-10-CM | POA: Diagnosis not present

## 2015-12-19 MED ORDER — PRAVASTATIN SODIUM 20 MG PO TABS: 20.0000 mg | ORAL_TABLET | Freq: Every day | ORAL | 3 refills | Status: DC

## 2015-12-19 NOTE — Progress Notes (Signed)
HISTORY OF PRESENT ILLNESS:  Roberto [name] is a 50 y.o. male , financial aid counselor at [employer], who is referred by his primary care provider Crosby Oysteravid Tysinger PA-C regarding routine colon cancer screening. The patient has not had prior colon cancer screening. However, he has multiple significant medical problems including morbid obesity with a BMI of greater than 52, blood clotting disorder with a history of pulmonary embolism as well as cardiac thrombus for which she is on chronic Coumadin therapy, and nonischemic cardiomyopathy with a history of congestive heart failure and diminished ejection fraction estimated to be 25-30%. He is positive for lupus anticoagulant as his risk factor for coagulopathy. [name] GI review of systems is entirely negative. Review of outside blood work is unremarkable including hemoglobin of 14.0. He is working on weight reduction  REVIEW OF SYSTEMS:  All non-GI ROS negative except for headaches, heart rhythm change  Past Medical History:  Diagnosis Date  . Arthritis   . Chronic combined systolic and diastolic CHF (congestive heart failure) (HCC)   . Deep vein thrombosis (HCC)   . Lupus anticoagulant positive   . Morbid obesity (HCC)   . Mural thrombus of heart    coumadin  . Nonischemic cardiomyopathy (HCC)    a) 12/17/11 echo: LVEF 25-30%, grade 3 diastolic dysfunction (c/w restriction), mod MR, mod LA/LV and mild RA dilatation; b) 12/18/11 cMRI: LVEF 38%, mod LV/mild RV dilatation, global HK, mild-mod RV sys dysfxn, no LV thrombus & patchy non-subendocardial delayed enhancement c/w infil dz or prior myocarditis   . Paroxysmal SVT (supraventricular tachycardia) (HCC)   . 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 CARDIAC CATHETERIZATION  06/2006   Angiographically normal cors  . CARDIAC CATHETERIZATION  12/22/2011   R/LHC: normal cors,  well-compensated HDs, LV dysfxn  . LEFT AND RIGHT HEART CATHETERIZATION WITH CORONARY ANGIOGRAM N/A 12/22/2011   Procedure: LEFT AND RIGHT HEART CATHETERIZATION WITH CORONARY ANGIOGRAM;  Surgeon: Dolores Pattyaniel R Bensimhon, MD;  Location[facility] CATH LAB;  Service: Cardiovascular;  Laterality: N/A;  . PATELLAR TENDON REPAIR     Left    Social History Roberto FillJoseph T [name]  reports that he has never smoked. He has never used smokeless tobacco. He reports that he does not drink alcohol or use drugs.  family history includes Cancer in his mother; Clotting disorder in his mother; Diabetes in his father, other, and sister; Heart attack in his father; Hypertension in his mother and other; Obesity in his other, sister, and sister.  Allergies  Allergen Reactions  . Shrimp [Shellfish Allergy] Hives and Itching       PHYSICAL EXAMINATION: Vital signs: BP (!) 82/66   Pulse 72   Ht 5\' 5"  (1.651 m)   Wt (!) 313 lb 2 oz (142 kg)   BMI 52.11 kg/m   Constitutional: Pleasant, obese, generally well-appearing, no acute distress Psychiatric: alert and oriented x3, cooperative Eyes: extraocular movements intact, anicteric, conjunctiva pink Mouth: oral pharynx moist, no lesions Neck: supple without thyromegaly Lymph no lymphadenopathy Cardiovascular: heart regular rate and rhythm, no murmur Lungs: clear to auscultation bilaterally Abdomen: soft, obese, nontender, nondistended, no obvious ascites, no peritoneal signs, normal bowel sounds, no organomegaly Rectal: Omitted Extremities: no clubbing or cyanosis. 1+ lower extremity edema bilaterally Skin: no lesions on visible extremities Neuro: No focal deficits. Cranial nerves intact  ASSESSMENT:  #1. Colon cancer screening. Average risk  patient for colon cancer. Asymptomatic with unremarkable blood work #2. Multiple significant comorbidities including morbid obesity, the need for chronic anticoagulation, and cardiomyopathy making optical colonoscopy a high-risk  procedure   PLAN:  #1. We discussed multiple colon cancer screening options including optical colonoscopy, radiology, Cologuard, and FIT stool testing. I reviewed with him recommended intervals if tests are negative. I reviewed with him the need for colonoscopy if tests are positive. I reviewed the limitations and risks of various strategies. Given his comorbidities we have elected to go with cologuard. If negative, repeat in 3 years. If positive, high-risk colonoscopy to be performed at the hospital.   A copy of this consultation note has been sent to Mr. Tysinger

## 2015-12-19 NOTE — Patient Instructions (Signed)
You will receive a phone call from [facility] to help you initiate the Cologuard

## 2015-12-20 ENCOUNTER — Telehealth: Payer: Self-pay

## 2015-12-20 DIAGNOSIS — I10 Essential (primary) hypertension: Secondary | ICD-10-CM

## 2015-12-21 ENCOUNTER — Encounter: Payer: Self-pay | Admitting: Internal Medicine

## 2015-12-25 NOTE — Telephone Encounter (Signed)
Referral to cardiology

## 2016-01-07 NOTE — Progress Notes (Deleted)
Cardiology Office Note   Date:  01/07/2016   ID:  KHALEE [name], DOB [birth year], MRN [id number]  PCP:  Carollee Herter, MD  Cardiologist:   Rollene Rotunda, MD  Referring:  ***  No chief complaint on file.     History of Present Illness: Roberto [name] is a 50 y.o. male who presents for follow up of cardiomyopathy.  He has been followed by the heart failure clinic but has not been seen  Since 2016.  He previously had a NICM (EF25-30% 2013 ), PSVT, and PE with recurrent DVT.  He had right and left heart cath in 12/2011.  Cardiac MRI in Dec of that year demonstrated patchy infiltrated which did not suggest ischemia but could not rule out infiltrative cardiomyopathy.  ***   h/o recurrent DVT with resultant PE and chronic venous insufficiency, h/o LV thrombus (on Coumadin), PSVT (elected for medical therapy over RFA on EP follow-up) and morbid obesity.   Admitted to [facility] 12/16/11 for recurrent HF. Diuresed 20 pounds with IV lasix. See RHC/LHC results   He returns for follow up today. Overall feeling great. Started on contrave for weight loss. Weight at home 340 pounds.  Denies SOB/PND/Orthopnea.  Exercising 4-5 times a week with stationary bike, treadmill, and elliptical. Ongoing LLE edema. Weight 335-339 pounds. Following low salt diet and limits fluid intake intake. Compliant with medications.   12/17/11 ECHO 25-30% Diffuse hypokinesis. Doppler parameters are consistent with a reversible restrictive pattern, indicative of decreased left ventricular diastolic compliance and/or increased left atrial pressure (grade 3 diastolic dysfunction).  CARDIAC MRI - 12/18/11  1. Moderately dilated left ventricle with moderately decreased systolic function, EF 38%. Global hypokinesis.  2. Mildly dilated right ventricle with mild to moderately decreased systolic function.  3. No definite LV thrombus noted.  4. Patchy non-subendocardial delayed enhancement seen in the ventricular septum (see above  for description). This is not suggestive of a coronary disease pattern. This could be suggestive of infiltrative disease versus prior myocarditis.     Past Medical History:  Diagnosis Date  . Arthritis   . Chronic combined systolic and diastolic CHF (congestive heart failure) (HCC)   . Deep vein thrombosis (HCC)   . Lupus anticoagulant positive   . Morbid obesity (HCC)   . Mural thrombus of heart    coumadin  . Nonischemic cardiomyopathy (HCC)    a) 12/17/11 echo: LVEF 25-30%, grade 3 diastolic dysfunction (c/w restriction), mod MR, mod LA/LV and mild RA dilatation; b) 12/18/11 cMRI: LVEF 38%, mod LV/mild RV dilatation, global HK, mild-mod RV sys dysfxn, no LV thrombus & patchy non-subendocardial delayed enhancement c/w infil dz or prior myocarditis   . Paroxysmal SVT (supraventricular tachycardia) (HCC)   . 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 CARDIAC CATHETERIZATION  06/2006   Angiographically normal cors  . CARDIAC CATHETERIZATION  12/22/2011   R/LHC: normal cors, well-compensated HDs, LV dysfxn  . LEFT AND RIGHT HEART CATHETERIZATION WITH CORONARY ANGIOGRAM N/A 12/22/2011   Procedure: LEFT AND RIGHT HEART CATHETERIZATION WITH CORONARY ANGIOGRAM;  Surgeon: Dolores Patty, MD;  Location: [facility] CATH LAB;  Service: Cardiovascular;  Laterality: N/A;  . PATELLAR TENDON REPAIR     Left     Current Outpatient Prescriptions  Medication Sig Dispense Refill  . carvedilol (COREG) 12.5 MG tablet TAKE ONE TABLET BY MOUTH TWICE DAILY WITH MEALS 180 tablet 3  .  digoxin (DIGOX) 0.125 MG tablet TAKE 1 TABLET(0.125 MG) BY MOUTH DAILY 90 tablet 3  . furosemide (LASIX) 80 MG tablet Take 1.5 tablets (120 mg total) by mouth 2 (two) times daily. 270 tablet 5  . losartan (COZAAR) 100 MG tablet TAKE ONE TABLET BY MOUTH ONCE DAILY 90 tablet 3  . Naltrexone-Bupropion HCl ER 8-90 MG TB12 Take 2 tablets by mouth 2 (two)  times daily. 360 tablet 1  . potassium chloride SA (K-DUR,KLOR-CON) 20 MEQ tablet Take 1 tablet (20 mEq total) by mouth 2 (two) times daily. 180 tablet 3  . pravastatin (PRAVACHOL) 20 MG tablet Take 1 tablet (20 mg total) by mouth daily. 90 tablet 3  . spironolactone (ALDACTONE) 25 MG tablet Take 1 tablet (25 mg total) by mouth daily. 90 tablet 3  . warfarin (COUMADIN) 10 MG tablet TAKE 1 TABLET BY MOUTH DAILY AT 6PM 90 tablet 0   No current facility-administered medications for this visit.     Allergies:   Shrimp [shellfish allergy]    Social History:  The patient  reports that he has never smoked. He has never used smokeless tobacco. He reports that he does not drink alcohol or use drugs.   Family History:  [name] ***family history includes Cancer in his mother; Clotting disorder in his mother; Diabetes in his father, other, and sister; Heart attack in his father; Hypertension in his mother and other; Obesity in his other, sister, and sister.    ROS:  Please see the history of present illness.   Otherwise, review of systems are positive for {NONE DEFAULTED:18576::"none"}.   All other systems are reviewed and negative.    PHYSICAL EXAM: VS:  There were no vitals taken for this visit. , BMI There is no height or weight on file to calculate BMI. GENERAL:  Well appearing HEENT:  Pupils equal round and reactive, fundi not visualized, oral mucosa unremarkable NECK:  No jugular venous distention, waveform within normal limits, carotid upstroke brisk and symmetric, no bruits, no thyromegaly LYMPHATICS:  No cervical, inguinal adenopathy LUNGS:  Clear to auscultation bilaterally BACK:  No CVA tenderness CHEST:  Unremarkable HEART:  PMI not displaced or sustained,S1 and S2 within normal limits, no S3, no S4, no clicks, no rubs, *** murmurs ABD:  Flat, positive bowel sounds normal in frequency in pitch, no bruits, no rebound, no guarding, no midline pulsatile mass, no hepatomegaly, no  splenomegaly EXT:  2 plus pulses throughout, no edema, no cyanosis no clubbing SKIN:  No rashes no nodules NEURO:  Cranial nerves II through XII grossly intact, motor grossly intact throughout PSYCH:  Cognitively intact, oriented to person place and time    EKG:  EKG {ACTION; IS/IS ZOX:09604540} ordered today. The ekg ordered today demonstrates ***   Recent Labs: 12/17/2015: ALT 11; BUN 17; Creat 1.04; Hemoglobin 14.0; Platelets 234; Potassium 4.2; Sodium 138    Lipid Panel    Component Value Date/Time   CHOL 107 (L) 09/18/2015 1013   TRIG 68 09/18/2015 1013   HDL 40 09/18/2015 1013   CHOLHDL 2.7 09/18/2015 1013   VLDL 14 09/18/2015 1013   LDLCALC 53 09/18/2015 1013      Wt Readings from Last 3 Encounters:  12/19/15 (!) 313 lb 2 oz (142 kg)  12/17/15 (!) 315 lb (142.9 kg)  09/18/15 (!) 308 lb 12.8 oz (140.1 kg)      Other studies Reviewed: Additional studies/ records that were reviewed today include: ***. Review of the above records demonstrates:  Please  see elsewhere in the note.  ***   ASSESSMENT AND PLAN:  CARDIOMYOPATHY:  ***  Nonischemic.    HISTORY OF DVT/PE:  ***  MORBID OBESITY:  ***   Current medicines are reviewed at length with the patient today.  The patient {ACTIONS; HAS/DOES NOT HAVE:19233} concerns regarding medicines.  The following changes have been made:  {PLAN; NO CHANGE:13088:s}  Labs/ tests ordered today include: *** No orders of the defined types were placed in this encounter.    Disposition:   FU with ***    Signed, Rollene RotundaJames Berry Gallacher, MD  01/07/2016 5:52 PM    [facility]

## 2016-01-08 ENCOUNTER — Ambulatory Visit: Payer: BC Managed Care – PPO | Admitting: Cardiology

## 2016-01-12 ENCOUNTER — Other Ambulatory Visit: Payer: Self-pay | Admitting: Medical

## 2016-01-22 ENCOUNTER — Other Ambulatory Visit: Payer: Self-pay

## 2016-01-22 LAB — COLONOSCOPY: COLOGUARD: NEGATIVE

## 2016-01-29 ENCOUNTER — Telehealth: Payer: Self-pay | Admitting: Family Medicine

## 2016-01-29 ENCOUNTER — Other Ambulatory Visit: Payer: Self-pay | Admitting: Medical

## 2016-01-29 MED ORDER — LIRAGLUTIDE 18 MG/3ML ~~LOC~~ SOPN: 1.8000 mg | PEN_INJECTOR | Freq: Every day | SUBCUTANEOUS | 2 refills | Status: DC

## 2016-01-29 NOTE — Telephone Encounter (Signed)
This medication (victoza) is daily for diabetes and weight loss, sugar control.  I sent to pharmacy.  Lets see If insurance covers this

## 2016-01-29 NOTE — Telephone Encounter (Signed)
Pt called and wanted to know the status of the Victoza.  He said you were going to check at his last visit and let him know.  Pt ph [phone]      [facility] on [address]

## 2016-02-01 ENCOUNTER — Ambulatory Visit: Payer: BC Managed Care – PPO | Admitting: Cardiology

## 2016-02-01 NOTE — Telephone Encounter (Signed)
Pt aware. He will call pharmacy and call us back if any issues. Trixie Rude

## 2016-02-11 ENCOUNTER — Ambulatory Visit (INDEPENDENT_AMBULATORY_CARE_PROVIDER_SITE_OTHER): Payer: BC Managed Care – PPO | Admitting: Medical

## 2016-02-11 ENCOUNTER — Other Ambulatory Visit: Payer: Self-pay

## 2016-02-11 DIAGNOSIS — Z111 Encounter for screening for respiratory tuberculosis: Secondary | ICD-10-CM

## 2016-02-11 DIAGNOSIS — Z7901 Long term (current) use of anticoagulants: Secondary | ICD-10-CM | POA: Diagnosis not present

## 2016-02-11 DIAGNOSIS — I872 Venous insufficiency (chronic) (peripheral): Secondary | ICD-10-CM

## 2016-02-11 LAB — PROTIME-INR
INR: 2.7 — ABNORMAL HIGH
Prothrombin Time: 27.4 s — ABNORMAL HIGH (ref 9.0–11.5)

## 2016-02-11 NOTE — Patient Instructions (Signed)
Verify dose, c/t same dose, f/u 79mo

## 2016-02-13 ENCOUNTER — Telehealth: Payer: Self-pay | Admitting: *Deleted

## 2016-02-13 LAB — TB SKIN TEST: TB SKIN TEST: NEGATIVE

## 2016-02-13 NOTE — Telephone Encounter (Signed)
Patient came in for PPD read and had form to be filled out also. I filled out what I could and put on your desk for your part. He had CPE this past Dec 2017. Please call when completed. Thanks.

## 2016-02-13 NOTE — Telephone Encounter (Signed)
Add address and phone number and fax back

## 2016-02-14 NOTE — Telephone Encounter (Signed)
I sent it back in green folder this morning

## 2016-02-14 NOTE — Telephone Encounter (Signed)
Do you know where form is now?

## 2016-02-18 ENCOUNTER — Telehealth: Payer: Self-pay | Admitting: Medical

## 2016-02-18 NOTE — Telephone Encounter (Signed)
Pt was called and informed that we received info from insurance that he needed a diabetic eye exam. Pt states he does not have diabetes and just had a eye appt about a year ago.

## 2016-02-21 NOTE — Progress Notes (Signed)
Cardiology Office Note   Date:  02/24/2016   ID:  Roberto [name], DOB 1965/06/25, MRN [id number]  PCP:  Carollee Herter, MD  Cardiologist:   Rollene Rotunda, MD  Referring:  Carollee Herter, MD  Chief Complaint  Patient presents with  . Cardiomyopathy      History of Present Illness: Roberto [name] is a 51 y.o. male who presents for follow up of cardiomyopathy.  He has previously seen Dr. Shirlee Latch.   He had a nonischemic cardiomyopathy with an ejection fraction of 25-30% in 2013. He had an MRI in 2013 demonstrated some patchy non-subendocardial delayed enhancement with questionable infiltrative pattern versus prior myocarditis. He had normal coronary arteries on right and left heart catheterization. His last ejection fraction in 2014 demonstrated his EF to be improved to 40-45%. 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 He's been on anticoagulation. He's had PSVT treated with ablation. He has morbid obesity.    He's not been seen for a while. He was having palpitations. He said these are similar to his previous SVT but not as severe. He's not had any presyncope or syncope. He's actually lost about 55 pounds with diet and exercise and medications. His breathing better. He's not been having any PND or orthopnea. He denies any chest pressure, neck or arm discomfort. He has been doing well on aerobic exercises the treadmill.   Past Medical History:  Diagnosis Date  . Arthritis   . Chronic combined systolic and diastolic CHF (congestive heart failure) (HCC)   . Deep vein thrombosis (HCC)   . Lupus anticoagulant positive   . Morbid obesity (HCC)   . Mural thrombus of heart    coumadin  . Nonischemic cardiomyopathy (HCC)    a) 12/17/11 echo: LVEF 25-30%, grade 3 diastolic dysfunction (c/w restriction), mod MR, mod LA/LV and mild RA dilatation; b) 12/18/11 cMRI: LVEF 38%, mod LV/mild RV dilatation, global HK, mild-mod RV sys dysfxn, no LV thrombus &  patchy non-subendocardial delayed enhancement c/w infil dz or prior myocarditis   . Paroxysmal SVT (supraventricular tachycardia) (HCC)   . 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 CARDIAC CATHETERIZATION  06/2006   Angiographically normal cors  . CARDIAC CATHETERIZATION  12/22/2011   R/LHC: normal cors, well-compensated HDs, LV dysfxn  . LEFT AND RIGHT HEART CATHETERIZATION WITH CORONARY ANGIOGRAM N/A 12/22/2011   Procedure: LEFT AND RIGHT HEART CATHETERIZATION WITH CORONARY ANGIOGRAM;  Surgeon: Dolores Patty, MD;  Location: [facility] CATH LAB;  Service: Cardiovascular;  Laterality: N/A;  . PATELLAR TENDON REPAIR     Left     Current Outpatient Prescriptions  Medication Sig Dispense Refill  . carvedilol (COREG) 12.5 MG tablet TAKE ONE TABLET BY MOUTH TWICE DAILY WITH MEALS 180 tablet 3  . digoxin (DIGOX) 0.125 MG tablet TAKE 1 TABLET(0.125 MG) BY MOUTH DAILY 90 tablet 3  . furosemide (LASIX) 80 MG tablet Take 80 mg by mouth 2 (two) times daily.    [name] liraglutide (VICTOZA) 18 MG/3ML SOPN Inject 0.3 mLs (1.8 mg total) into the skin daily. 3 mL 2  . losartan (COZAAR) 100 MG tablet TAKE ONE TABLET BY MOUTH ONCE DAILY 90 tablet 3  . Naltrexone-Bupropion HCl ER 8-90 MG TB12 Take 2 tablets by mouth 2 (two) times daily. 360 tablet 1  . potassium chloride SA (K-DUR,KLOR-CON) 20 MEQ tablet Take 1 tablet (  20 mEq total) by mouth 2 (two) times daily. 180 tablet 3  . pravastatin (PRAVACHOL) 20 MG tablet Take 1 tablet (20 mg total) by mouth daily. 90 tablet 3  . spironolactone (ALDACTONE) 25 MG tablet Take 1 tablet (25 mg total) by mouth daily. 90 tablet 3  . warfarin (COUMADIN) 10 MG tablet TAKE 1 TABLET BY MOUTH DAILY AT 6PM 90 tablet 0   No current facility-administered medications for this visit.     Allergies:   Shrimp [shellfish allergy]    ROS:  Please see the history of present illness.   Otherwise, review of  systems are positive for none.   All other systems are reviewed and negative.    PHYSICAL EXAM: VS:  BP 130/78   Pulse 72   Ht 5\' 5"  (1.651 m)   Wt (!) 311 lb 12.8 oz (141.4 kg)   BMI 51.89 kg/m  , BMI Body mass index is 51.89 kg/m. GENERAL:  Well appearing HEENT:  Pupils equal round and reactive, fundi not visualized, oral mucosa unremarkable NECK:  No jugular venous distention, waveform within normal limits, carotid upstroke brisk and symmetric, no bruits, no thyromegaly LYMPHATICS:  No cervical, inguinal adenopathy LUNGS:  Clear to auscultation bilaterally BACK:  No CVA tenderness CHEST:  Unremarkable HEART:  PMI not displaced or sustained,S1 and S2 within normal limits, no S3, no S4, no clicks, no rubs, no murmurs ABD:  Flat, positive bowel sounds normal in frequency in pitch, no bruits, no rebound, no guarding, no midline pulsatile mass, no hepatomegaly, no splenomegaly, obese EXT:  2 plus pulses throughout, mild leg edema, no cyanosis no clubbing SKIN:  No rashes no nodules NEURO:  Cranial nerves II through XII grossly intact, motor grossly intact throughout PSYCH:  Cognitively intact, oriented to person place and time    EKG:  EKG is ordered today. The ekg ordered 12/17/15 demonstrates sinus rhythm, rate 75, left atrial enlargement, poor anterior R wave progression, low voltage on the limited chest leads   Recent Labs: 12/17/2015: ALT 11; BUN 17; Creat 1.04; Hemoglobin 14.0; Platelets 234; Potassium 4.2; Sodium 138    Lipid Panel    Component Value Date/Time   CHOL 107 (L) 09/18/2015 1013   TRIG 68 09/18/2015 1013   HDL 40 09/18/2015 1013   CHOLHDL 2.7 09/18/2015 1013   VLDL 14 09/18/2015 1013   LDLCALC 53 09/18/2015 1013      Wt Readings from Last 3 Encounters:  02/22/16 (!) 311 lb 12.8 oz (141.4 kg)  12/19/15 (!) 313 lb 2 oz (142 kg)  12/17/15 (!) 315 lb (142.9 kg)      Other studies Reviewed: Additional studies/ records that were reviewed today include:  Labs Review of the above records demonstrates:   WNL   ASSESSMENT AND PLAN:  CHRONIC SYSTOLIC AND DIASTOLIC HF:  I'm going to repeat an echocardiogram. He seems to be euvolemic. Of note I reduced his Lasix 80 mg twice a day and we talked about when necessary dosing. He is on very high doses of Lasix.  CHRONIC RECURRENT DVT  :  He's had recurrent DVT and he will continue on his anticoagulation.   MORBID OBESITY:   I congratulated him on weight loss. Hopefully this continues. If not in the future he did good candidate for bariatric surgery.  SVT s/p ABLATION:    He is going to get an [employer] (he actually ordered while we were in the room) and I'll be happy to review these strips.   Current  medicines are reviewed at length with the patient today.  The patient does not have concerns regarding medicines.  The following changes have been made:  As above  Labs/ tests ordered today include:   Orders Placed This Encounter  Procedures  . ECHOCARDIOGRAM COMPLETE     Disposition:   FU with me in two months.     Signed, Rollene Rotunda, MD  02/24/2016 7:08 PM     [facility]

## 2016-02-22 ENCOUNTER — Ambulatory Visit (INDEPENDENT_AMBULATORY_CARE_PROVIDER_SITE_OTHER): Payer: BC Managed Care – PPO | Admitting: Cardiology

## 2016-02-22 ENCOUNTER — Encounter: Payer: Self-pay | Admitting: Cardiology

## 2016-02-22 VITALS — BP 130/78 | HR 72 | Ht 65.0 in | Wt 311.8 lb

## 2016-02-22 DIAGNOSIS — R002 Palpitations: Secondary | ICD-10-CM | POA: Diagnosis not present

## 2016-02-22 DIAGNOSIS — I428 Other cardiomyopathies: Secondary | ICD-10-CM | POA: Diagnosis not present

## 2016-02-22 NOTE — Patient Instructions (Signed)
Medication Instructions:  DECREASE- Furosemide 80 mg twice a day  Labwork: None Ordered  Testing/Procedures: Your physician has requested that you have an echocardiogram. Echocardiography is a painless test that uses sound waves to create images of your heart. It provides your doctor with information about the size and shape of your heart and how well your heart's chambers and valves are working. This procedure takes approximately one hour. There are no restrictions for this procedure.   Follow-Up: Your physician recommends that you schedule a follow-up appointment in: 2 Months.   Any Other Special Instructions Will Be Listed Below (If Applicable).   If you need a refill on your cardiac medications before your next appointment, please call your pharmacy.

## 2016-02-24 ENCOUNTER — Encounter: Payer: Self-pay | Admitting: Cardiology

## 2016-03-10 ENCOUNTER — Other Ambulatory Visit (HOSPITAL_COMMUNITY): Payer: BC Managed Care – PPO

## 2016-04-01 ENCOUNTER — Telehealth: Payer: Self-pay

## 2016-04-01 ENCOUNTER — Ambulatory Visit: Payer: BC Managed Care – PPO | Admitting: Medical

## 2016-04-01 NOTE — Telephone Encounter (Signed)

## 2016-04-02 NOTE — Telephone Encounter (Signed)
Forwarding

## 2016-04-02 NOTE — Telephone Encounter (Signed)
D

## 2016-04-04 ENCOUNTER — Encounter: Payer: Self-pay | Admitting: Medical

## 2016-04-04 NOTE — Telephone Encounter (Signed)
No show letter with appt request sent.  °

## 2016-04-10 ENCOUNTER — Encounter: Payer: Self-pay | Admitting: Cardiology

## 2016-04-22 NOTE — Progress Notes (Deleted)
Cardiology Office Note   Date:  04/22/2016   ID:  Roberto [name], DOB 1965/07/03, MRN [id number]  PCP:  Carollee Herter, MD  Cardiologist:   Rollene Rotunda, MD   No chief complaint on file.     History of Present Illness: Roberto [name] is a 51 y.o. male who presents for follow up of cardiomyopathy.  He has previously seen Dr. Shirlee Latch.   He had a nonischemic cardiomyopathy with an ejection fraction of 25-30% in 2013. He had an MRI in 2013 demonstrated some patchy non-subendocardial delayed enhancement with questionable infiltrative pattern versus prior myocarditis. He had normal coronary arteries on right and left heart catheterization. Echo in 2014 demonstrated his EF to be improved to 40-45%.  When I saw him recently I repeated an echo which demonstrated an EF of 40 - 45% with diffuse hypokinesis with some increased hypokinesis in the apex.  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 He's been on anticoagulation. He's had PSVT treated with ablation. He has morbid obesity.    He returns for follow up.  ***   He's not been seen for a while. He was having palpitations. He said these are similar to his previous SVT but not as severe. He's not had any presyncope or syncope. He's actually lost about 55 pounds with diet and exercise and medications. His breathing better. He's not been having any PND or orthopnea. He denies any chest pressure, neck or arm discomfort. He has been doing well on aerobic exercises the treadmill.   Past Medical History:  Diagnosis Date  . Arthritis   . Chronic combined systolic and diastolic CHF (congestive heart failure) (HCC)   . Deep vein thrombosis (HCC)   . Lupus anticoagulant positive   . Morbid obesity (HCC)   . Mural thrombus of heart    coumadin  . Nonischemic cardiomyopathy (HCC)    a) 12/17/11 echo: LVEF 25-30%, grade 3 diastolic dysfunction (c/w restriction), mod MR, mod LA/LV and mild RA dilatation; b) 12/18/11  cMRI: LVEF 38%, mod LV/mild RV dilatation, global HK, mild-mod RV sys dysfxn, no LV thrombus & patchy non-subendocardial delayed enhancement c/w infil dz or prior myocarditis   . Paroxysmal SVT (supraventricular tachycardia) (HCC)   . 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 CARDIAC CATHETERIZATION  06/2006   Angiographically normal cors  . CARDIAC CATHETERIZATION  12/22/2011   R/LHC: normal cors, well-compensated HDs, LV dysfxn  . LEFT AND RIGHT HEART CATHETERIZATION WITH CORONARY ANGIOGRAM N/A 12/22/2011   Procedure: LEFT AND RIGHT HEART CATHETERIZATION WITH CORONARY ANGIOGRAM;  Surgeon: Dolores Patty, MD;  Location: [facility] CATH LAB;  Service: Cardiovascular;  Laterality: N/A;  . PATELLAR TENDON REPAIR     Left     Current Outpatient Prescriptions  Medication Sig Dispense Refill  . carvedilol (COREG) 12.5 MG tablet TAKE ONE TABLET BY MOUTH TWICE DAILY WITH MEALS 180 tablet 3  . digoxin (DIGOX) 0.125 MG tablet TAKE 1 TABLET(0.125 MG) BY MOUTH DAILY 90 tablet 3  . furosemide (LASIX) 80 MG tablet Take 80 mg by mouth 2 (two) times daily.    [name] liraglutide (VICTOZA) 18 MG/3ML SOPN Inject 0.3 mLs (1.8 mg total) into the skin daily. 3 mL 2  . losartan (COZAAR) 100 MG tablet TAKE ONE TABLET BY MOUTH ONCE DAILY 90 tablet 3  . Naltrexone-Bupropion HCl ER 8-90 MG  TB12 Take 2 tablets by mouth 2 (two) times daily. 360 tablet 1  . potassium chloride SA (K-DUR,KLOR-CON) 20 MEQ tablet Take 1 tablet (20 mEq total) by mouth 2 (two) times daily. 180 tablet 3  . pravastatin (PRAVACHOL) 20 MG tablet Take 1 tablet (20 mg total) by mouth daily. 90 tablet 3  . spironolactone (ALDACTONE) 25 MG tablet Take 1 tablet (25 mg total) by mouth daily. 90 tablet 3  . warfarin (COUMADIN) 10 MG tablet TAKE 1 TABLET BY MOUTH DAILY AT 6PM 90 tablet 0   No current facility-administered medications for this visit.     Allergies:   Shrimp  [shellfish allergy]    ROS:  Please see the history of present illness.   Otherwise, review of systems are positive for ***.   All other systems are reviewed and negative.    PHYSICAL EXAM: VS:  There were no vitals taken for this visit. , BMI There is no height or weight on file to calculate BMI. GENERAL:  Well appearing HEENT:  Pupils equal round and reactive, fundi not visualized, oral mucosa unremarkable *** NECK:  No jugular venous distention, waveform within normal limits, carotid upstroke brisk and symmetric, no bruits, no thyromegaly LYMPHATICS:  No cervical, inguinal adenopathy LUNGS:  Clear to auscultation bilaterally BACK:  No CVA tenderness CHEST:  Unremarkable HEART:  PMI not displaced or sustained,S1 and S2 within normal limits, no S3, no S4, no clicks, no rubs, no murmurs ABD:  Flat, positive bowel sounds normal in frequency in pitch, no bruits, no rebound, no guarding, no midline pulsatile mass, no hepatomegaly, no splenomegaly, obese EXT:  2 plus pulses throughout, mild leg edema, no cyanosis no clubbing  *** SKIN:  No rashes no nodules NEURO:  Cranial nerves II through XII grossly intact, motor grossly intact throughout PSYCH:  Cognitively intact, oriented to person place and time    EKG:  EKG is  ***ordered today. The ekg ordered 12/17/15 demonstrates sinus rhythm, rate 75, left atrial enlargement, poor anterior R wave progression, low voltage on the limited chest leads   Recent Labs: 12/17/2015: ALT 11; BUN 17; Creat 1.04; Hemoglobin 14.0; Platelets 234; Potassium 4.2; Sodium 138    Lipid Panel    Component Value Date/Time   CHOL 107 (L) 09/18/2015 1013   TRIG 68 09/18/2015 1013   HDL 40 09/18/2015 1013   CHOLHDL 2.7 09/18/2015 1013   VLDL 14 09/18/2015 1013   LDLCALC 53 09/18/2015 1013      Wt Readings from Last 3 Encounters:  02/22/16 (!) 311 lb 12.8 oz (141.4 kg)  12/19/15 (!) 313 lb 2 oz (142 kg)  12/17/15 (!) 315 lb (142.9 kg)      Other  studies Reviewed: Additional studies/ records that were reviewed today include: *** Review of the above records demonstrates:   ***   ASSESSMENT AND PLAN:  CHRONIC SYSTOLIC AND DIASTOLIC HF:  *** I'm going to repeat an echocardiogram. He seems to be euvolemic. Of note I reduced his Lasix 80 mg twice a day and we talked about when necessary dosing. He is on very high doses of Lasix.  CHRONIC RECURRENT DVT  :  *** He's had recurrent DVT and he will continue on his anticoagulation.   MORBID OBESITY:   *** I congratulated him on weight loss. Hopefully this continues. If not in the future he did good candidate for bariatric surgery.  SVT s/p ABLATION:    H*** e is going to get an Education officer, environmental (he actually ordered  while we were in the room) and I'll be happy to review these strips.   Current medicines are reviewed at length with the patient today.  The patient does not have concerns regarding medicines.  The following changes have been made:  ***  Labs/ tests ordered today include: ***  No orders of the defined types were placed in this encounter.    Disposition:   FU with me in ***months.     Signed, Rollene Rotunda, MD  04/22/2016 12:43 PM    [facility]

## 2016-04-23 ENCOUNTER — Encounter: Payer: Self-pay | Admitting: Cardiology

## 2016-04-23 ENCOUNTER — Ambulatory Visit: Payer: BC Managed Care – PPO | Admitting: Cardiology

## 2016-04-24 ENCOUNTER — Encounter: Payer: Self-pay | Admitting: *Deleted

## 2016-05-01 ENCOUNTER — Other Ambulatory Visit: Payer: Self-pay | Admitting: Medical

## 2016-05-01 NOTE — Telephone Encounter (Signed)
Is this okay to refill? 

## 2016-05-12 ENCOUNTER — Encounter: Payer: Self-pay | Admitting: Cardiology

## 2016-05-27 ENCOUNTER — Ambulatory Visit (INDEPENDENT_AMBULATORY_CARE_PROVIDER_SITE_OTHER): Payer: BC Managed Care – PPO | Admitting: Medical

## 2016-05-27 DIAGNOSIS — I872 Venous insufficiency (chronic) (peripheral): Secondary | ICD-10-CM | POA: Diagnosis not present

## 2016-05-27 LAB — CP4508-PT/INR AND PTT
INR: 3.1 — ABNORMAL HIGH
PROTHROMBIN TIME: 31.2 s — AB (ref 9.0–11.5)
aPTT: 40 s — ABNORMAL HIGH (ref 22–34)

## 2016-07-14 ENCOUNTER — Encounter: Payer: Self-pay | Admitting: Family Medicine

## 2016-07-14 ENCOUNTER — Ambulatory Visit (INDEPENDENT_AMBULATORY_CARE_PROVIDER_SITE_OTHER): Payer: BC Managed Care – PPO | Admitting: Family Medicine

## 2016-07-14 VITALS — BP 116/78 | HR 86 | Temp 98.1°F | Wt 299.8 lb

## 2016-07-14 DIAGNOSIS — T148XXA Other injury of unspecified body region, initial encounter: Secondary | ICD-10-CM | POA: Diagnosis not present

## 2016-07-14 DIAGNOSIS — Z7901 Long term (current) use of anticoagulants: Secondary | ICD-10-CM

## 2016-07-14 DIAGNOSIS — S8992XA Unspecified injury of left lower leg, initial encounter: Secondary | ICD-10-CM | POA: Diagnosis not present

## 2016-07-14 LAB — PROTIME-INR
INR: 2.4 — AB
PROTHROMBIN TIME: 24.1 s — AB (ref 9.0–11.5)

## 2016-07-14 NOTE — Patient Instructions (Signed)
Use heat to the area 2-3 times daily for 20 minutes at a time and keep your leg elevated. The bruising will change colors. If you notice any new or worsening symptoms then let us know right away.  We will call you with your lab results.    Hematoma A hematoma is a collection of blood. The collection of blood can turn into a hard, painful lump under the skin. Your skin may turn blue or yellow if the hematoma is close to the surface of the skin. Most hematomas get better in a few days to weeks. Some hematomas are serious and need medical care. Hematomas can be very small or very big. Follow these instructions at home:  Apply ice to the injured area: ? Put ice in a plastic bag. ? Place a towel between your skin and the bag. ? Leave the ice on for 20 minutes, 2-3 times a day for the first 1 to 2 days.  After the first 2 days, switch to using warm packs on the injured area.  Raise (elevate) the injured area to lessen pain and puffiness (swelling). You may also wrap the area with an elastic bandage. Make sure the bandage is not wrapped too tight.  If you have a painful hematoma on your leg or foot, you may use crutches for a couple days.  Only take medicines as told by your doctor. Get help right away if:  Your pain gets worse.  Your pain is not controlled with medicine.  You have a fever.  Your puffiness gets worse.  Your skin turns more blue or yellow.  Your skin over the hematoma breaks or starts bleeding.  Your hematoma is in your chest or belly (abdomen) and you are short of breath, feel weak, or have a change in consciousness.  Your hematoma is on your scalp and you have a headache that gets worse or a change in alertness or consciousness. This information is not intended to replace advice given to you by your health care provider. Make sure you discuss any questions you have with your health care provider. Document Released: 02/07/2004 Document Revised: 06/07/2015 Document  Reviewed: 06/09/2012 Elsevier Interactive Patient Education  2017 [employer].

## 2016-07-14 NOTE — Progress Notes (Signed)
   Subjective:    Patient ID: Roberto [name], male    DOB: 06-Jan-1966, 52 y.o.   MRN: [id number]  HPI Chief Complaint  Patient presents with  . bruise on leg    bruise on leg,  felt a pop on hamstring either tuesday or wednesday and daughter noticed the bruise on friday. hurts to touch and very hot, pt is on coumadin   He is a 51 year old male with a history of PE, DVT, CHF and morbid obesity who is here with complaints of bruising, tenderness and warmth to his right posterior leg for the past 4 days after feeling a "pop" and injuring his left hamstring 1 day prior.  Reports significant tenderness to his left calf but denies pain with ambulation.  He has not tried ice or heat. No medication has been taken or needed for pain per patient.   Denies fever, chills, dizziness, chest pain, palpitations, shortness of breath, cough, abdominal pain, N/V/D.   His last PT/INR was in May. He has been on Coumadin since 2007 for recurrent DVT and PE. No recent missed doses or antibiotic use.   Reviewed allergies, medications, past medical, surgical, and social history.   Review of Systems Pertinent positives and negatives in the history of present illness.     Objective:   Physical Exam BP 116/78   Pulse 86   Temp 98.1 F (36.7 C) (Oral)   Wt 299 lb 12.8 oz (136 kg)   BMI 49.89 kg/m   Alert and in no distress.  Pharyngeal area is normal. Neck is supple without adenopathy or thyromegaly. Cardiac exam shows a regular rhythm without murmurs or gallops. Lungs are clear to auscultation. Left hamstring with bruising posteriorly approximately 2 inches above left knee and down to his left foot posteriorly. Normal sensation, cap refill, pulses, warmth, ROM and strength to bilateral LE.  Mild tenderness to left calf.  Negative Homan's.  Left calf 21" compared to right calf 18". (LLE is apparently chronically larger than RLE)     Assessment & Plan:  Left leg injury, initial encounter - Plan:  Protime-INR  Long term current use of anticoagulant therapy - Plan: Protime-INR  Hematoma - Plan: Protime-INR  Discussed that this is most likely a hematoma due to the injury he sustained last week and less likely a DVT. He is on daily Coumadin and will check a stat PT/INR. Will have him use heat to the area and elevate his leg. He will call or return with any new or worsening symptoms. He is aware that it may take several weeks for this to resolve.  Dr. Susann Givens also examined patient and agrees with plan of care.

## 2016-08-05 ENCOUNTER — Other Ambulatory Visit: Payer: Self-pay | Admitting: Medical

## 2016-08-11 ENCOUNTER — Encounter: Payer: Self-pay | Admitting: Medical

## 2016-08-11 ENCOUNTER — Ambulatory Visit (INDEPENDENT_AMBULATORY_CARE_PROVIDER_SITE_OTHER): Payer: BC Managed Care – PPO | Admitting: Medical

## 2016-08-11 VITALS — BP 110/70 | HR 84 | Wt 296.4 lb

## 2016-08-11 DIAGNOSIS — G4489 Other headache syndrome: Secondary | ICD-10-CM

## 2016-08-11 DIAGNOSIS — Z7901 Long term (current) use of anticoagulants: Secondary | ICD-10-CM

## 2016-08-11 LAB — PROTIME-INR
INR: 2.5 — AB
Prothrombin Time: 26.3 s — ABNORMAL HIGH (ref 9.0–11.5)

## 2016-08-11 MED ORDER — HYDROCODONE-ACETAMINOPHEN 5-325 MG PO TABS: 1.0000 | ORAL_TABLET | Freq: Four times a day (QID) | ORAL | 0 refills | Status: DC | PRN

## 2016-08-11 NOTE — Progress Notes (Signed)
Subjective: Chief Complaint  Patient presents with  . Headache    headaches comes an goes x 2 days   Here for headache that started Saturday evening, unchanged since Saturday.   Has constant dull headache, hanging around.   Though the could sleep it off.  Using some Tylenol but not much improved.   He is busy with work, been under stress.  Opened up another preschool recently.    Hasn't been getting headaches regularly.  No numbness, no tinging, no weakness.  No vision or hearing changes.  No nausea, no vomiting.  No other aggravating or relieving factors. No other complaint.   Past Medical History:  Diagnosis Date  . Arthritis   . Chronic combined systolic and diastolic CHF (congestive heart failure) (HCC)   . Deep vein thrombosis (HCC)   . Lupus anticoagulant positive   . Morbid obesity (HCC)   . Mural thrombus of heart    coumadin  . Nonischemic cardiomyopathy (HCC)    a) 12/17/11 echo: LVEF 25-30%, grade 3 diastolic dysfunction (c/w restriction), mod MR, mod LA/LV and mild RA dilatation; b) 12/18/11 cMRI: LVEF 38%, mod LV/mild RV dilatation, global HK, mild-mod RV sys dysfxn, no LV thrombus & patchy non-subendocardial delayed enhancement c/w infil dz or prior myocarditis   . Paroxysmal SVT (supraventricular tachycardia) (HCC)   . Pulmonary embolism (HCC)    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   Current Outpatient Prescriptions on File Prior to Visit  Medication Sig Dispense Refill  . carvedilol (COREG) 12.5 MG tablet TAKE ONE TABLET BY MOUTH TWICE DAILY WITH MEALS (Patient taking differently: TAKE half TABLET BY MOUTH TWICE DAILY WITH MEALS) 180 tablet 3  . digoxin (DIGOX) 0.125 MG tablet TAKE 1 TABLET(0.125 MG) BY MOUTH DAILY 90 tablet 3  . furosemide (LASIX) 80 MG tablet Take 80 mg by mouth 2 (two) times daily.    [name] liraglutide (VICTOZA) 18 MG/3ML SOPN Inject 0.3 mLs (1.8 mg total) into the skin daily. 3 mL 2  . losartan (COZAAR) 100 MG tablet TAKE ONE TABLET BY MOUTH ONCE DAILY 90  tablet 3  . Naltrexone-Bupropion HCl ER 8-90 MG TB12 Take 2 tablets by mouth 2 (two) times daily. 360 tablet 1  . potassium chloride SA (K-DUR,KLOR-CON) 20 MEQ tablet Take 1 tablet (20 mEq total) by mouth 2 (two) times daily. 180 tablet 3  . spironolactone (ALDACTONE) 25 MG tablet Take 1 tablet (25 mg total) by mouth daily. 90 tablet 3  . warfarin (COUMADIN) 10 MG tablet TAKE 1 TABLET BY MOUTH DAILY AT 6PM 90 tablet 0   No current facility-administered medications on file prior to visit.     ROS as in subjective   Objective: BP 110/70   Pulse 84   Wt 296 lb 6.4 oz (134.4 kg)   SpO2 98%   BMI 49.32 kg/m   Wt Readings from Last 3 Encounters:  08/11/16 296 lb 6.4 oz (134.4 kg)  07/14/16 299 lb 12.8 oz (136 kg)  02/22/16 (!) 311 lb 12.8 oz (141.4 kg)   General appearance: alert, no distress, WD/WN HEENT: normocephalic, sclerae anicteric, TMs pearly, nares patent, no discharge or erythema, pharynx normal Oral cavity: MMM, no lesions Neck: supple, no lymphadenopathy, no thyromegaly, no masses, no bruits Heart: RRR, normal S1, S2, no murmurs Lungs: CTA bilaterally, no wheezes, rhonchi, or rales Ext: chronic darker purplish brown coloration of left lower leg, no calve tenderness, some tension in left lower leg compared to right lower leg,  Pulses:  1+ symmetric, upper and lower extremities, normal cap refill     Assessment: Encounter Diagnoses  Name Primary?  . Headache syndrome Yes  . Long term current use of anticoagulant therapy     Plan: Rest,  Hydrate well, can use norco short term to break the current headache.    PT/INR lab today.   Headache is likely stress related and poor sleep recently.  Discussed signs/symptoms that would prompt recheck.    Roberto [name] was seen today for headache.  Diagnoses and all orders for this visit:  Headache syndrome -     Protime-INR  Long term current use of anticoagulant therapy -     Protime-INR  Other orders -      HYDROcodone-acetaminophen (NORCO) 5-325 MG tablet; Take 1 tablet by mouth every 6 (six) hours as needed for moderate pain.

## 2016-09-06 ENCOUNTER — Other Ambulatory Visit: Payer: Self-pay | Admitting: Medical

## 2016-09-08 NOTE — Telephone Encounter (Signed)
Can pt have a refill on meds  

## 2016-10-14 ENCOUNTER — Emergency Department (HOSPITAL_BASED_OUTPATIENT_CLINIC_OR_DEPARTMENT_OTHER): Payer: BC Managed Care – PPO

## 2016-10-14 ENCOUNTER — Encounter (HOSPITAL_BASED_OUTPATIENT_CLINIC_OR_DEPARTMENT_OTHER): Payer: Self-pay | Admitting: Emergency Medicine

## 2016-10-14 ENCOUNTER — Inpatient Hospital Stay (HOSPITAL_BASED_OUTPATIENT_CLINIC_OR_DEPARTMENT_OTHER)
Admission: EM | Admit: 2016-10-14 | Discharge: 2016-10-21 | DRG: 287 | Disposition: A | Discharge status: Home / Self Care | Payer: BC Managed Care – PPO | Attending: Cardiology | Admitting: Cardiology

## 2016-10-14 DIAGNOSIS — L03116 Cellulitis of left lower limb: Secondary | ICD-10-CM | POA: Diagnosis present

## 2016-10-14 DIAGNOSIS — D6862 Lupus anticoagulant syndrome: Secondary | ICD-10-CM | POA: Diagnosis present

## 2016-10-14 DIAGNOSIS — R0600 Dyspnea, unspecified: Secondary | ICD-10-CM | POA: Diagnosis present

## 2016-10-14 DIAGNOSIS — Z79899 Other long term (current) drug therapy: Secondary | ICD-10-CM

## 2016-10-14 DIAGNOSIS — I5022 Chronic systolic (congestive) heart failure: Secondary | ICD-10-CM

## 2016-10-14 DIAGNOSIS — I480 Paroxysmal atrial fibrillation: Secondary | ICD-10-CM | POA: Diagnosis present

## 2016-10-14 DIAGNOSIS — J189 Pneumonia, unspecified organism: Secondary | ICD-10-CM | POA: Diagnosis not present

## 2016-10-14 DIAGNOSIS — J209 Acute bronchitis, unspecified: Secondary | ICD-10-CM | POA: Diagnosis present

## 2016-10-14 DIAGNOSIS — I5023 Acute on chronic systolic (congestive) heart failure: Secondary | ICD-10-CM | POA: Diagnosis not present

## 2016-10-14 DIAGNOSIS — M1000 Idiopathic gout, unspecified site: Secondary | ICD-10-CM | POA: Diagnosis not present

## 2016-10-14 DIAGNOSIS — I5043 Acute on chronic combined systolic (congestive) and diastolic (congestive) heart failure: Secondary | ICD-10-CM | POA: Diagnosis present

## 2016-10-14 DIAGNOSIS — Z86718 Personal history of other venous thrombosis and embolism: Secondary | ICD-10-CM

## 2016-10-14 DIAGNOSIS — I4892 Unspecified atrial flutter: Secondary | ICD-10-CM | POA: Diagnosis present

## 2016-10-14 DIAGNOSIS — I509 Heart failure, unspecified: Secondary | ICD-10-CM

## 2016-10-14 DIAGNOSIS — I4891 Unspecified atrial fibrillation: Secondary | ICD-10-CM | POA: Diagnosis not present

## 2016-10-14 DIAGNOSIS — Z86711 Personal history of pulmonary embolism: Secondary | ICD-10-CM | POA: Diagnosis not present

## 2016-10-14 DIAGNOSIS — I110 Hypertensive heart disease with heart failure: Secondary | ICD-10-CM | POA: Diagnosis present

## 2016-10-14 DIAGNOSIS — Z6841 Body Mass Index (BMI) 40.0 and over, adult: Secondary | ICD-10-CM | POA: Diagnosis not present

## 2016-10-14 DIAGNOSIS — I471 Supraventricular tachycardia: Secondary | ICD-10-CM | POA: Diagnosis not present

## 2016-10-14 DIAGNOSIS — Z7901 Long term (current) use of anticoagulants: Secondary | ICD-10-CM

## 2016-10-14 DIAGNOSIS — M109 Gout, unspecified: Secondary | ICD-10-CM | POA: Diagnosis present

## 2016-10-14 DIAGNOSIS — E876 Hypokalemia: Secondary | ICD-10-CM | POA: Diagnosis not present

## 2016-10-14 DIAGNOSIS — I959 Hypotension, unspecified: Secondary | ICD-10-CM | POA: Diagnosis not present

## 2016-10-14 LAB — BRAIN NATRIURETIC PEPTIDE: B NATRIURETIC PEPTIDE 5: 210.2 pg/mL — AB (ref 0.0–100.0)

## 2016-10-14 LAB — BASIC METABOLIC PANEL
ANION GAP: 9 (ref 5–15)
ANION GAP: 6 (ref 5–15)
BUN: 16 mg/dL (ref 6–20)
BUN: 16 mg/dL (ref 6–20)
CALCIUM: 8.6 mg/dL — AB (ref 8.9–10.3)
CHLORIDE: 99 mmol/L — AB (ref 101–111)
CHLORIDE: 97 mmol/L — AB (ref 101–111)
CO2: 27 mmol/L (ref 22–32)
CO2: 30 mmol/L (ref 22–32)
Calcium: 8.6 mg/dL — ABNORMAL LOW (ref 8.9–10.3)
Creatinine, Ser: 1.06 mg/dL (ref 0.61–1.24)
Creatinine, Ser: 1.21 mg/dL (ref 0.61–1.24)
GFR calc non Af Amer: >60 mL/min (ref >60)
GFR calc non Af Amer: >60 mL/min (ref >60)
GLUCOSE: 129 mg/dL — AB (ref 65–99)
GLUCOSE: 92 mg/dL (ref 65–99)
POTASSIUM: 3.9 mmol/L (ref 3.5–5.1)
POTASSIUM: 3.8 mmol/L (ref 3.5–5.1)
SODIUM: 135 mmol/L (ref 135–145)
Sodium: 133 mmol/L — ABNORMAL LOW (ref 135–145)

## 2016-10-14 LAB — CBC
HEMATOCRIT: 42.8 % (ref 39.0–52.0)
HEMOGLOBIN: 14 g/dL (ref 13.0–17.0)
MCH: 29 pg (ref 26.0–34.0)
MCHC: 32.7 g/dL (ref 30.0–36.0)
MCV: 88.6 fL (ref 78.0–100.0)
Platelets: 218 10*3/uL (ref 150–400)
RBC: 4.83 MIL/uL (ref 4.22–5.81)
RDW: 13.9 % (ref 11.5–15.5)
WBC: 14.2 10*3/uL — ABNORMAL HIGH (ref 4.0–10.5)

## 2016-10-14 LAB — TSH: TSH: 1.946 u[IU]/mL (ref 0.350–4.500)

## 2016-10-14 LAB — HEPATIC FUNCTION PANEL
ALT: 22 U/L (ref 17–63)
AST: 27 U/L (ref 15–41)
Albumin: 3.3 g/dL — ABNORMAL LOW (ref 3.5–5.0)
Alkaline Phosphatase: 41 U/L (ref 38–126)
BILIRUBIN DIRECT: 0.1 mg/dL (ref 0.1–0.5)
Indirect Bilirubin: 0.5 mg/dL (ref 0.3–0.9)
Total Bilirubin: 0.6 mg/dL (ref 0.3–1.2)
Total Protein: 7.5 g/dL (ref 6.5–8.1)

## 2016-10-14 LAB — MAGNESIUM: Magnesium: 1.8 mg/dL (ref 1.7–2.4)

## 2016-10-14 LAB — PROTIME-INR
INR: 2.75
INR: 2.76
Prothrombin Time: 28.9 seconds — ABNORMAL HIGH (ref 11.4–15.2)
Prothrombin Time: 29 seconds — ABNORMAL HIGH (ref 11.4–15.2)

## 2016-10-14 LAB — DIGOXIN LEVEL
DIGOXIN LVL: 0.5 ng/mL — AB (ref 0.8–2.0)
DIGOXIN LVL: 0.3 ng/mL — AB (ref 0.8–2.0)

## 2016-10-14 LAB — TROPONIN I: TROPONIN I: 0.03 ng/mL — AB (ref <0.03)

## 2016-10-14 MED ORDER — ONDANSETRON HCL 4 MG/2ML IJ SOLN: 4.0000 mg | Freq: Four times a day (QID) | INTRAMUSCULAR | Status: DC | PRN

## 2016-10-14 MED ORDER — GUAIFENESIN 100 MG/5ML PO SOLN
5.0000 mL | ORAL | Status: DC | PRN
  Administered 2016-10-14: 100 mg via ORAL
  Filled 2016-10-14: qty 5

## 2016-10-14 MED ORDER — CEPHALEXIN 500 MG PO CAPS
500.0000 mg | ORAL_CAPSULE | Freq: Four times a day (QID) | ORAL | Status: DC
  Administered 2016-10-14 – 2016-10-15 (×2): 500 mg via ORAL
  Filled 2016-10-14 (×2): qty 1

## 2016-10-14 MED ORDER — WARFARIN SODIUM 10 MG PO TABS
10.0000 mg | ORAL_TABLET | Freq: Every day | ORAL | Status: DC
  Administered 2016-10-15: 10 mg via ORAL
  Filled 2016-10-14: qty 1

## 2016-10-14 MED ORDER — SODIUM CHLORIDE 0.9% FLUSH
3.0000 mL | Freq: Two times a day (BID) | INTRAVENOUS | Status: DC
  Administered 2016-10-14 – 2016-10-19 (×9): 3 mL via INTRAVENOUS

## 2016-10-14 MED ORDER — SODIUM CHLORIDE 0.9% FLUSH: 3.0000 mL | INTRAVENOUS | Status: DC | PRN

## 2016-10-14 MED ORDER — WARFARIN - PHARMACIST DOSING INPATIENT: Freq: Every day | Status: DC

## 2016-10-14 MED ORDER — FUROSEMIDE 10 MG/ML IJ SOLN
80.0000 mg | Freq: Once | INTRAMUSCULAR | Status: AC
  Administered 2016-10-14: 80 mg via INTRAVENOUS
  Filled 2016-10-14: qty 8

## 2016-10-14 MED ORDER — FUROSEMIDE 10 MG/ML IJ SOLN
80.0000 mg | Freq: Two times a day (BID) | INTRAMUSCULAR | Status: DC
  Administered 2016-10-14 – 2016-10-15 (×2): 80 mg via INTRAVENOUS
  Filled 2016-10-14 (×2): qty 8

## 2016-10-14 MED ORDER — NALTREXONE-BUPROPION HCL ER 8-90 MG PO TB12: 2.0000 | ORAL_TABLET | Freq: Two times a day (BID) | ORAL | Status: DC

## 2016-10-14 MED ORDER — POTASSIUM CHLORIDE CRYS ER 20 MEQ PO TBCR
20.0000 meq | EXTENDED_RELEASE_TABLET | Freq: Two times a day (BID) | ORAL | Status: DC
  Administered 2016-10-14 – 2016-10-15 (×2): 20 meq via ORAL
  Filled 2016-10-14 (×2): qty 1

## 2016-10-14 MED ORDER — ACETAMINOPHEN 325 MG PO TABS
650.0000 mg | ORAL_TABLET | ORAL | Status: DC | PRN
  Administered 2016-10-15 – 2016-10-20 (×3): 650 mg via ORAL
  Filled 2016-10-14 (×3): qty 2

## 2016-10-14 MED ORDER — DIGOXIN 125 MCG PO TABS
0.1250 mg | ORAL_TABLET | Freq: Every day | ORAL | Status: DC
  Administered 2016-10-15 – 2016-10-21 (×7): 0.125 mg via ORAL
  Filled 2016-10-14 (×7): qty 1

## 2016-10-14 MED ORDER — SODIUM CHLORIDE 0.9 % IV SOLN
250.0000 mL | INTRAVENOUS | Status: DC | PRN
  Administered 2016-10-17: 07:00:00 via INTRAVENOUS

## 2016-10-14 NOTE — ED Triage Notes (Signed)
Pt states he started to feel slightly sob since Friday.  Worsening gradually.  Pt relates this am is the worse with productive cough, pink frothy sputum.  Unknown weight gain.  Pt denies more swelling.

## 2016-10-14 NOTE — Progress Notes (Signed)
ANTICOAGULATION CONSULT NOTE - Initial Consult  Pharmacy Consult for Warfarin  Indication: LV thrombus  Allergies  Allergen Reactions  . Shrimp [Shellfish Allergy] Hives and Itching    Patient Measurements: Height: 5\' 8"  (172.7 cm) Weight: 291 lb (132 kg) IBW/kg (Calculated) : 68.4  Vital Signs: Temp: 98.5 F (36.9 C) (10/02 1739) Temp Source: Oral (10/02 1739) BP: 85/50 (10/02 1739) Pulse Rate: 86 (10/02 1739)  Labs:  Recent Labs  10/14/16 0740  HGB 14.0  HCT 42.8  PLT 218  LABPROT 28.9*  INR 2.75  CREATININE 1.06  TROPONINI 0.03*    Estimated Creatinine Clearance: 109.4 mL/min (by C-G formula based on SCr of 1.06 mg/dL).   Medical History: Past Medical History:  Diagnosis Date  . Arthritis   . Chronic combined systolic and diastolic CHF (congestive heart failure) (HCC)   . Deep vein thrombosis (HCC)   . Lupus anticoagulant positive   . Morbid obesity (HCC)   . Mural thrombus of heart    coumadin  . Nonischemic cardiomyopathy (HCC)    a) 12/17/11 echo: LVEF 25-30%, grade 3 diastolic dysfunction (c/w restriction), mod MR, mod LA/LV and mild RA dilatation; b) 12/18/11 cMRI: LVEF 38%, mod LV/mild RV dilatation, global HK, mild-mod RV sys dysfxn, no LV thrombus & patchy non-subendocardial delayed enhancement c/w infil dz or prior myocarditis   . Paroxysmal SVT (supraventricular tachycardia) (HCC)   . Pulmonary embolism (HCC)    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    Medications:  Prescriptions Prior to Admission  Medication Sig Dispense Refill Last Dose  . carvedilol (COREG) 12.5 MG tablet TAKE ONE TABLET BY MOUTH TWICE DAILY WITH MEALS (Patient taking differently: Take 12.5 mg by mouth 2 (two) times daily with a meal. ) 180 tablet 3 10/14/2016 at 0600  . cephALEXin (KEFLEX) 500 MG capsule Take 500 mg by mouth 4 (four) times daily.  0 10/14/2016 at Unknown time  . CONTRAVE 8-90 MG TB12 TAKE 2 TABLETS BY MOUTH TWICE DAILY 360 tablet 0 10/14/2016 at 0600  .  digoxin (DIGOX) 0.125 MG tablet TAKE 1 TABLET(0.125 MG) BY MOUTH DAILY 90 tablet 3 10/14/2016 at Unknown time  . furosemide (LASIX) 80 MG tablet Take 80 mg by mouth 2 (two) times daily.   10/14/2016 at 0600  . losartan (COZAAR) 100 MG tablet TAKE ONE TABLET BY MOUTH ONCE DAILY (Patient taking differently: Take 100 mg by mouth daily. ) 90 tablet 3 10/14/2016 at Unknown time  . potassium chloride SA (K-DUR,KLOR-CON) 20 MEQ tablet Take 1 tablet (20 mEq total) by mouth 2 (two) times daily. 180 tablet 3 10/14/2016 at 0600  . spironolactone (ALDACTONE) 25 MG tablet Take 1 tablet (25 mg total) by mouth daily. 90 tablet 3 10/14/2016 at Unknown time  . warfarin (COUMADIN) 10 MG tablet TAKE 1 TABLET BY MOUTH DAILY AT 6PM (Patient taking differently: TAKE 1 TABLET (10mg ) BY MOUTH DAILY AT 6PM.) 90 tablet 0 10/14/2016 at 0600    Assessment: 51 YOM with mural LV thrombus on warfarin at home. Pharmacy consulted to resume warfarin. INR on admission is therapeutic at 2.75. H/H and Plt wnl. No significant drug interactions noted. Last dose of warfarin was today   Home warfarin dose: 10 mg daily   Goal of Therapy:  INR 2-3 Monitor platelets by anticoagulation protocol: Yes   Plan:  -Resume home warfarin dose of 10 mg daily -Monitor daily PT/INR and for s/s of bleeding    Vinnie Level, PharmD., BCPS Clinical Pharmacist Pager [phone]

## 2016-10-14 NOTE — H&P (Signed)
Advanced Heart Failure Team History and Physical Note   Primary Physician:  Dr Susann Givens  Primary HF Cardiologist:  Dr Gala Romney Reason for Admission: Acute/Chronic Systolic Heart Failure   HPI:   Roberto [name] is a 51 year old with a history of DVT, mural thrombus, NICM, paroxysmal SVT, PE, and morbid obesity.    Earlier today he presented to [facility] with increased dyspnea. 4 days ago he was evaluated for LLE cellulitis. Treated with oral antibiotics. CXR with mild CHF. Pertinent admission labs included K 3.9, sodium 135, hgb 14, INR 2.75, and troponin 0.03. In the ED he received 80 mg IV lasix.   He says that his weight has been decreasing at home, about 60 pounds down in the past year. Denies orthopnea, PND, chest pain.    ECHO EF 2016 40-45% MRI 2013  1. Moderately dilated left ventricle with moderately decreased systolic function, EF 38%.  Global hypokinesis. 2.  Mildly dilated right ventricle with mild to moderately decreased systolic function. 3. No definite LV thrombus noted. 4. Patchy non-subendocardial delayed enhancement seen in the ventricular septum (see above for description).  This is not suggestive of a coronary disease pattern.  This could be suggestive of infiltrative disease versus prior myocarditis.  Review of Systems: [y] = yes, [ ]  = no   General: Weight gain [Y ]; Weight loss [ ] ; Anorexia [ ] ; Fatigue [Y ]; Fever [ ] ; Chills [ ] ; Weakness [ ]   Cardiac: Chest pain/pressure [ ] ; Resting SOB [ Y]; Exertional SOB [Y ]; Orthopnea [ ] ; Pedal Edema [Y ]; Palpitations [ ] ; Syncope [ ] ; Presyncope [ ] ; Paroxysmal nocturnal dyspnea[ ]   Pulmonary: Cough [ y]; Wheezing[ ] ; Hemoptysis[ ] ; Sputum [ ] ; Snoring [ ]   GI: Vomiting[ ] ; Dysphagia[ ] ; Melena[ ] ; Hematochezia [ ] ; Heartburn[ ] ; Abdominal pain [ ] ; Constipation [ ] ; Diarrhea [ ] ; BRBPR [ ]   GU: Hematuria[ ] ; Dysuria [ ] ; Nocturia[ ]   Vascular: Pain in legs with walking [ ] ; Pain in feet with lying flat [ ] ;  Non-healing sores [ ] ; Stroke [ ] ; TIA [ ] ; Slurred speech [ ] ;  Neuro: Headaches[ ] ; Vertigo[ ] ; Seizures[ ] ; Paresthesias[ ] ;Blurred vision [ ] ; Diplopia [ ] ; Vision changes [ ]   Ortho/Skin: Arthritis [ ] ; Joint pain [Y ]; Muscle pain [ ] ; Joint swelling [ ] ; Back Pain [Y ]; Rash [ ]   Psych: Depression[ ] ; Anxiety[ ]   Heme: Bleeding problems [ ] ; Clotting disorders [ ] ; Anemia [ ]   Endocrine: Diabetes [ ] ; Thyroid dysfunction[ ]    Home Medications Prior to Admission medications   Medication Sig Start Date End Date Taking? Authorizing Provider  carvedilol (COREG) 12.5 MG tablet TAKE ONE TABLET BY MOUTH TWICE DAILY WITH MEALS Patient taking differently: TAKE half TABLET BY MOUTH TWICE DAILY WITH MEALS 12/17/15   Tysinger, Kermit Balo, PA-C  CONTRAVE 8-90 MG TB12 TAKE 2 TABLETS BY MOUTH TWICE DAILY 09/08/16   Tysinger, Kermit Balo, PA-C  digoxin (DIGOX) 0.125 MG tablet TAKE 1 TABLET(0.125 MG) BY MOUTH DAILY 12/17/15   Tysinger, Kermit Balo, PA-C  furosemide (LASIX) 80 MG tablet Take 80 mg by mouth 2 (two) times daily.    [provider]  losartan (COZAAR) 100 MG tablet TAKE ONE TABLET BY MOUTH ONCE DAILY 12/17/15   Tysinger, Kermit Balo, PA-C  potassium chloride SA (K-DUR,KLOR-CON) 20 MEQ tablet Take 1 tablet (20 mEq total) by mouth 2 (two) times daily. 12/17/15   Tysinger, Kermit Balo, PA-C  spironolactone (ALDACTONE) 25 MG tablet Take 1 tablet (25 mg total) by mouth daily. 12/17/15   Tysinger, Kermit Balo, PA-C  warfarin (COUMADIN) 10 MG tablet TAKE 1 TABLET BY MOUTH DAILY AT 6PM 08/05/16   Tysinger, Kermit Balo, PA-C    Past Medical History: Past Medical History:  Diagnosis Date  . Arthritis   . Chronic combined systolic and diastolic CHF (congestive heart failure) (HCC)   . Deep vein thrombosis (HCC)   . Lupus anticoagulant positive   . Morbid obesity (HCC)   . Mural thrombus of heart    coumadin  . Nonischemic cardiomyopathy (HCC)    a) 12/17/11 echo: LVEF 25-30%, grade 3 diastolic dysfunction (c/w  restriction), mod Roberto, mod LA/LV and mild RA dilatation; b) 12/18/11 cMRI: LVEF 38%, mod LV/mild RV dilatation, global HK, mild-mod RV sys dysfxn, no LV thrombus & patchy non-subendocardial delayed enhancement c/w infil dz or prior myocarditis   . Paroxysmal SVT (supraventricular tachycardia) (HCC)   . Pulmonary embolism (HCC)    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    Past Surgical History: Past Surgical History:  Procedure Laterality Date  . CARDIAC CATHETERIZATION  06/2006   Angiographically normal cors  . CARDIAC CATHETERIZATION  12/22/2011   R/LHC: normal cors, well-compensated HDs, LV dysfxn  . LEFT AND RIGHT HEART CATHETERIZATION WITH CORONARY ANGIOGRAM N/A 12/22/2011   Procedure: LEFT AND RIGHT HEART CATHETERIZATION WITH CORONARY ANGIOGRAM;  Surgeon: Dolores Patty, MD;  Location: [facility] CATH LAB;  Service: Cardiovascular;  Laterality: N/A;  . PATELLAR TENDON REPAIR     Left    Family History:  Family History  Problem Relation Age of Onset  . Hypertension Mother   . Clotting disorder Mother        mom with PEs  . Cancer Mother        uterine cancer  . Diabetes Father   . Heart attack Father   . Hypertension Other        Sibling  . Diabetes Other   . Obesity Other   . Diabetes Sister   . Obesity Sister   . Obesity Sister   . Colon cancer Neg Hx   . Stomach cancer Neg Hx   . Esophageal cancer Neg Hx   . Rectal cancer Neg Hx   . Liver cancer Neg Hx     Social History: Social History   Social History  . Marital status: Married    Spouse name: N/A  . Number of children: 5  . Years of education: N/A   Occupational History  . Oceanographer for [employer]   Social History Main Topics  . Smoking status: Never Smoker  . Smokeless tobacco: Never Used  . Alcohol use No     Comment: Rarely  . Drug use: No  . Sexual activity: Not Asked   Other Topics Concern  . None   Social History Narrative   Lives in  [place] 03/2015, 5 children.   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  Exercises with weights and some walking. 12/2015    Allergies:  Allergies  Allergen Reactions  . Shrimp [Shellfish Allergy] Hives and Itching    Objective:    Vital Signs:   Temp:  [99.3 F (37.4 C)] 99.3 F (37.4 C) (10/02 0735) Pulse Rate:  [82-89] 85 (10/02 1330) Resp:  [16-38] 20 (10/02 1330) BP: (81-101)/(49-70) 83/59 (10/02 1330) SpO2:  [90 %-100 %]  98 % (10/02 1330) Weight:  [295 lb (133.8 kg)-297 lb 3.2 oz (134.8 kg)] 297 lb 3.2 oz (134.8 kg) (10/02 0808)   Filed Weights   10/14/16 0736 10/14/16 0808  Weight: 295 lb (133.8 kg) 297 lb 3.2 oz (134.8 kg)     Physical Exam     General:  Well appearing. No respiratory difficulty HEENT: Normal Neck: Supple. JVD to jaw. Carotids 2+ bilat; no bruits. No lymphadenopathy or thyromegaly appreciated. Cor: PMI nondisplaced. Regular rate & rhythm. No rubs, gallops or murmurs. Lungs: Clear Abdomen: obese,soft, nontender, nondistended. No hepatosplenomegaly. No bruits or masses. Good bowel sounds. Extremities: No cyanosis, clubbing, rash, edema Neuro: Alert & oriented x 3, cranial nerves grossly intact. moves all 4 extremities w/o difficulty. Affect pleasant.   Telemetry   NSR   EKG   Sinus Rhythm with PVCs 90 bpm   Labs     Basic Metabolic Panel:  Recent Labs Lab 10/14/16 0740  NA 135  K 3.9  CL 99*  CO2 27  GLUCOSE 129*  BUN 16  CREATININE 1.06  CALCIUM 8.6*    Liver Function Tests:  Recent Labs Lab 10/14/16 0740  AST 27  ALT 22  ALKPHOS 41  BILITOT 0.6  PROT 7.5  ALBUMIN 3.3*   No results for input(s): LIPASE, AMYLASE in the last 168 hours. No results for input(s): AMMONIA in the last 168 hours.  CBC:  Recent Labs Lab 10/14/16 0740  WBC 14.2*  HGB 14.0  HCT 42.8  MCV 88.6  PLT 218    Cardiac Enzymes:  Recent Labs Lab 10/14/16 0740  TROPONINI 0.03*    BNP: BNP (last 3  results)  Recent Labs  10/14/16 0740  BNP 210.2*    ProBNP (last 3 results) No results for input(s): PROBNP in the last 8760 hours.   CBG: No results for input(s): GLUCAP in the last 168 hours.  Coagulation Studies:  Recent Labs  10/14/16 0740  LABPROT 28.9*  INR 2.75    Imaging: Dg Chest Portable 1 View  Result Date: 10/14/2016 CLINICAL DATA:  Shortness of breath, CHF, morbid obesity, history of previous pulmonary embolism, never smoked. EXAM: PORTABLE CHEST 1 VIEW COMPARISON:  Chest x-ray of December 16, 2011 FINDINGS: The lungs are adequately inflated. The cardiac silhouette is enlarged. The pulmonary vascularity is mildly engorged. There is no pleural effusion or alveolar infiltrate. The trachea is midline. The observed bony thorax is unremarkable. IMPRESSION: Findings consistent with mild CHF. Stable cardiomegaly. No alveolar pneumonia. Electronically Signed   By: David  [place] M.D.   On: 10/14/2016 08:09      Patient Profile  Roberto [name] is a 51 year old with a history of DVT, mural thrombus, NICM, paroxysmal SVT, PE, and morbid obesity admitted with A/C systolic heart failure.   Assessment/Plan   1. A/C Systolic Heart Failure: NICM, Echo 2016 with EF 40-45%.  - NYHA III - Volume elevated on exam - Continue lasix 80 mg BID - BMET in the am. Continue K BID.  - Hold beta blocker with acute decompensation. - Hold Spiro and losartan with hypotension.  - Does not appear low output currently.   2. Morbid Obesity Body mass index is 45.19 kg/m.   3. H/O DVT/PE On coumadin. INR 2.75. Pharmacy to dose coumadin.    Little Ishikawa, NP-C 7:14 PM  Advanced Heart Failure Team Pager [phone] (M-F; 7a - 4p)  Please contact [facility] for night-coverage after hours (4p -7a ) and weekends on [email or web address]

## 2016-10-14 NOTE — Progress Notes (Addendum)
Patient had 19 beats of SVT. Patient asymptomatic, VSS.  Notified MD on call Virgina Organ.  Per MD Magnesium level should be check and morning labs can be checked as well along with Magnesium level.  Magnesium added and morning lab order modified in order to be done tonight per MD.  Will continue to monitor.  Ledia Hanford, RN

## 2016-10-14 NOTE — ED Provider Notes (Signed)
[facility] Provider Note   CSN: [id number] Arrival date & time: 10/14/16  4259     History   Chief Complaint Chief Complaint  Patient presents with  . Shortness of Breath    HPI Roberto [name] is a 51 y.o. male.  HPI Patient presents with shortness of breath. History of CHF. 4 days ago had cough. Treated with abx for cellulitis of left lower leg. The cellulitis has improved, but worsening shortness of breath and dyspnea on exertion. No fevers. He has had cough with pink sputum. Mild dull chest pain. He has not noticed a weight gain. He has chronic swelling in his legs. Chronically worse swelling on his left leg. He was able to sleep on his side last night.  Past Medical History:  Diagnosis Date  . Arthritis   . Chronic combined systolic and diastolic CHF (congestive heart failure) (HCC)   . Deep vein thrombosis (HCC)   . Lupus anticoagulant positive   . Morbid obesity (HCC)   . Mural thrombus of heart    coumadin  . Nonischemic cardiomyopathy (HCC)    a) 12/17/11 echo: LVEF 25-30%, grade 3 diastolic dysfunction (c/w restriction), mod MR, mod LA/LV and mild RA dilatation; b) 12/18/11 cMRI: LVEF 38%, mod LV/mild RV dilatation, global HK, mild-mod RV sys dysfxn, no LV thrombus & patchy non-subendocardial delayed enhancement c/w infil dz or prior myocarditis   . Paroxysmal SVT (supraventricular tachycardia) (HCC)   . Pulmonary embolism (HCC)    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    Patient Active Problem List   Diagnosis Date Noted  . Varicose veins of legs 12/17/2015  . Encounter for health maintenance examination in adult 12/17/2015  . Special screening for malignant neoplasms, colon 12/17/2015  . Vaccine counseling 12/17/2015  . Screening for prostate cancer 12/17/2015  . Acquired deformity of right knee 12/17/2015  . Impaired gait 12/17/2015  . Osteoarthritis of right knee 12/17/2015  . Varicose veins of lower extremities with ulcer (HCC) 09/18/2015  .  Need for prophylactic vaccination and inoculation against influenza 09/18/2015  . Impaired fasting blood sugar 12/28/2014  . Long term current use of anticoagulant therapy 12/28/2014  . Erectile dysfunction 02/19/2012  . Chronic systolic heart failure (HCC) 12/29/2011  . Hypokalemia 12/24/2011  . Lupus anticoagulant positive 12/22/2011  . Nonischemic cardiomyopathy (HCC) 12/16/2011  . Paroxysmal SVT (supraventricular tachycardia) (HCC) 12/16/2011  . History of venous thromboembolism 12/16/2011  . Venous insufficiency 11/04/2011  . OBESITY, MORBID 06/02/2008    Past Surgical History:  Procedure Laterality Date  . CARDIAC CATHETERIZATION  06/2006   Angiographically normal cors  . CARDIAC CATHETERIZATION  12/22/2011   R/LHC: normal cors, well-compensated HDs, LV dysfxn  . LEFT AND RIGHT HEART CATHETERIZATION WITH CORONARY ANGIOGRAM N/A 12/22/2011   Procedure: LEFT AND RIGHT HEART CATHETERIZATION WITH CORONARY ANGIOGRAM;  Surgeon: Dolores Patty, MD;  Location: [facility] CATH LAB;  Service: Cardiovascular;  Laterality: N/A;  . PATELLAR TENDON REPAIR     Left       Home Medications    Prior to Admission medications   Medication Sig Start Date End Date Taking? Authorizing Provider  carvedilol (COREG) 12.5 MG tablet TAKE ONE TABLET BY MOUTH TWICE DAILY WITH MEALS Patient taking differently: TAKE half TABLET BY MOUTH TWICE DAILY WITH MEALS 12/17/15   Tysinger, Kermit Balo, PA-C  CONTRAVE 8-90 MG TB12 TAKE 2 TABLETS BY MOUTH TWICE DAILY 09/08/16   Tysinger, Kermit Balo, PA-C  digoxin (DIGOX) 0.125 MG tablet TAKE  1 TABLET(0.125 MG) BY MOUTH DAILY 12/17/15   Tysinger, Kermit Balo, PA-C  furosemide (LASIX) 80 MG tablet Take 80 mg by mouth 2 (two) times daily.    [provider]  losartan (COZAAR) 100 MG tablet TAKE ONE TABLET BY MOUTH ONCE DAILY 12/17/15   Tysinger, Kermit Balo, PA-C  potassium chloride SA (K-DUR,KLOR-CON) 20 MEQ tablet Take 1 tablet (20 mEq total) by mouth 2 (two) times daily. 12/17/15    Tysinger, Kermit Balo, PA-C  spironolactone (ALDACTONE) 25 MG tablet Take 1 tablet (25 mg total) by mouth daily. 12/17/15   Tysinger, Kermit Balo, PA-C  warfarin (COUMADIN) 10 MG tablet TAKE 1 TABLET BY MOUTH DAILY AT 6PM 08/05/16   Tysinger, Kermit Balo, PA-C    Family History Family History  Problem Relation Age of Onset  . Hypertension Mother   . Clotting disorder Mother        mom with PEs  . Cancer Mother        uterine cancer  . Diabetes Father   . Heart attack Father   . Hypertension Other        Sibling  . Diabetes Other   . Obesity Other   . Diabetes Sister   . Obesity Sister   . Obesity Sister   . Colon cancer Neg Hx   . Stomach cancer Neg Hx   . Esophageal cancer Neg Hx   . Rectal cancer Neg Hx   . Liver cancer Neg Hx     Social History Social History  Substance Use Topics  . Smoking status: Never Smoker  . Smokeless tobacco: Never Used  . Alcohol use No     Comment: Rarely     Allergies   Shrimp [shellfish allergy]   Review of Systems Review of Systems  Constitutional: Negative for appetite change and fever.  HENT: Negative for congestion.   Respiratory: Positive for cough and shortness of breath.   Cardiovascular: Positive for chest pain and leg swelling.  Gastrointestinal: Negative for abdominal pain.  Endocrine: Negative for polyuria.  Genitourinary: Negative for flank pain.  Musculoskeletal: Negative for back pain.  Skin: Negative for rash.  Neurological: Negative for numbness.  Psychiatric/Behavioral: Negative for confusion.     Physical Exam Updated Vital Signs BP (!) 87/62   Pulse 85   Temp 99.3 F (37.4 C) (Oral)   Resp (!) 38   Ht  (1.727 m)   Wt 134.8 kg (297 lb 3.2 oz)   SpO2 97%   BMI 45.19 kg/m   Physical Exam  Constitutional: He appears well-developed and well-nourished.  HENT:  Head: Normocephalic.  Eyes: Pupils are equal, round, and reactive to light.  Cardiovascular: Normal rate.   Pulmonary/Chest: He has rales.  Rales  bilateral bases.  Abdominal: Soft. There is no tenderness.  Musculoskeletal: He exhibits edema.  Pitting edema to bilateral LE. Chronic venous changes left lower leg.  Neurological: He is alert.  Skin: Skin is warm. Capillary refill takes less than 2 seconds.     ED Treatments / Results  Labs (all labs ordered are listed, but only abnormal results are displayed) Labs Reviewed  BASIC METABOLIC PANEL - Abnormal; Notable for the following:       Result Value   Chloride 99 (*)    Glucose, Bld 129 (*)    Calcium 8.6 (*)    All other components within normal limits  CBC - Abnormal; Notable for the following:    WBC 14.2 (*)    All other  components within normal limits  TROPONIN I - Abnormal; Notable for the following:    Troponin I 0.03 (*)    All other components within normal limits  PROTIME-INR - Abnormal; Notable for the following:    Prothrombin Time 28.9 (*)    All other components within normal limits  BRAIN NATRIURETIC PEPTIDE - Abnormal; Notable for the following:    B Natriuretic Peptide 210.2 (*)    All other components within normal limits  DIGOXIN LEVEL - Abnormal; Notable for the following:    Digoxin Level 0.5 (*)    All other components within normal limits  HEPATIC FUNCTION PANEL - Abnormal; Notable for the following:    Albumin 3.3 (*)    All other components within normal limits    EKG  EKG Interpretation  Date/Time:  Tuesday October 14 2016 07:32:42 EDT Ventricular Rate:  90 PR Interval:    QRS Duration: 108 QT Interval:  332 QTC Calculation: 407 R Axis:   -112 Text Interpretation:  Sinus rhythm Multiform ventricular premature complexes LAD, consider left anterior fascicular block Low voltage, extremity and precordial leads Anteroseptal infarct, old No significant change since last tracing Confirmed by Benjiman Core [phone]) on 10/14/2016 7:45:25 AM       Radiology Dg Chest Portable 1 View  Result Date: 10/14/2016 CLINICAL DATA:  Shortness of  breath, CHF, morbid obesity, history of previous pulmonary embolism, never smoked. EXAM: PORTABLE CHEST 1 VIEW COMPARISON:  Chest x-ray of December 16, 2011 FINDINGS: The lungs are adequately inflated. The cardiac silhouette is enlarged. The pulmonary vascularity is mildly engorged. There is no pleural effusion or alveolar infiltrate. The trachea is midline. The observed bony thorax is unremarkable. IMPRESSION: Findings consistent with mild CHF. Stable cardiomegaly. No alveolar pneumonia. Electronically Signed   By: David  [place] M.D.   On: 10/14/2016 08:09    Procedures Procedures (including critical care time)  Medications Ordered in ED Medications  furosemide (LASIX) injection 80 mg (not administered)     Initial Impression / Assessment and Plan / ED Course  I have reviewed the triage vital signs and the nursing notes.  Pertinent labs & imaging results that were available during my care of the patient were reviewed by me and considered in my medical decision making (see chart for details).     Patient with shortness of breath. Pink sputum production. Some hypotension and apparent CHF. Will give lasix. Admit to cardiology.  Final Clinical Impressions(s) / ED Diagnoses   Final diagnoses:  Acute on chronic congestive heart failure, unspecified heart failure type [facility])    New Prescriptions New Prescriptions   No medications on file     Benjiman Core, MD 10/14/16 [phone]

## 2016-10-14 NOTE — ED Notes (Signed)
Spoke to doug at [facility].  Pt is on the list for a bed.

## 2016-10-15 ENCOUNTER — Inpatient Hospital Stay (HOSPITAL_COMMUNITY): Payer: BC Managed Care – PPO

## 2016-10-15 DIAGNOSIS — J189 Pneumonia, unspecified organism: Secondary | ICD-10-CM

## 2016-10-15 DIAGNOSIS — I5023 Acute on chronic systolic (congestive) heart failure: Secondary | ICD-10-CM

## 2016-10-15 DIAGNOSIS — I4892 Unspecified atrial flutter: Secondary | ICD-10-CM

## 2016-10-15 LAB — PROTIME-INR
INR: 2.86
PROTHROMBIN TIME: 29.8 s — AB (ref 11.4–15.2)

## 2016-10-15 LAB — BASIC METABOLIC PANEL
ANION GAP: 8 (ref 5–15)
BUN: 16 mg/dL (ref 6–20)
CALCIUM: 8.3 mg/dL — AB (ref 8.9–10.3)
CO2: 31 mmol/L (ref 22–32)
Chloride: 96 mmol/L — ABNORMAL LOW (ref 101–111)
Creatinine, Ser: 1.23 mg/dL (ref 0.61–1.24)
GFR calc Af Amer: >60 mL/min (ref >60)
GFR calc non Af Amer: >60 mL/min (ref >60)
GLUCOSE: 104 mg/dL — AB (ref 65–99)
Potassium: 4.2 mmol/L (ref 3.5–5.1)
Sodium: 135 mmol/L (ref 135–145)

## 2016-10-15 LAB — ECHOCARDIOGRAM COMPLETE
HEIGHTINCHES: 68 in
WEIGHTICAEL: 4659.2 [oz_av]

## 2016-10-15 LAB — URINALYSIS, ROUTINE W REFLEX MICROSCOPIC
BILIRUBIN URINE: NEGATIVE
Glucose, UA: NEGATIVE mg/dL
HGB URINE DIPSTICK: NEGATIVE
KETONES UR: NEGATIVE mg/dL
Leukocytes, UA: NEGATIVE
NITRITE: NEGATIVE
PROTEIN: NEGATIVE mg/dL
SPECIFIC GRAVITY, URINE: 1.008 (ref 1.005–1.030)
pH: 6 (ref 5.0–8.0)

## 2016-10-15 LAB — HIV ANTIBODY (ROUTINE TESTING W REFLEX): HIV Screen 4th Generation wRfx: NONREACTIVE

## 2016-10-15 LAB — PROCALCITONIN

## 2016-10-15 LAB — GLUCOSE, CAPILLARY
GLUCOSE-CAPILLARY: 115 mg/dL — AB (ref 65–99)
GLUCOSE-CAPILLARY: 95 mg/dL (ref 65–99)
Glucose-Capillary: 106 mg/dL — ABNORMAL HIGH (ref 65–99)

## 2016-10-15 MED ORDER — AMIODARONE HCL IN DEXTROSE 360-4.14 MG/200ML-% IV SOLN
60.0000 mg/h | INTRAVENOUS | Status: DC
  Administered 2016-10-15: 30 mg/h via INTRAVENOUS
  Administered 2016-10-16: 60 mg/h via INTRAVENOUS
  Administered 2016-10-16 (×2): 30 mg/h via INTRAVENOUS
  Administered 2016-10-17 – 2016-10-18 (×4): 60 mg/h via INTRAVENOUS
  Filled 2016-10-15 (×9): qty 200

## 2016-10-15 MED ORDER — ORAL CARE MOUTH RINSE
15.0000 mL | Freq: Two times a day (BID) | OROMUCOSAL | Status: DC
  Administered 2016-10-15 – 2016-10-21 (×10): 15 mL via OROMUCOSAL

## 2016-10-15 MED ORDER — VANCOMYCIN HCL 10 G IV SOLR
2000.0000 mg | Freq: Once | INTRAVENOUS | Status: AC
  Administered 2016-10-15: 2000 mg via INTRAVENOUS
  Filled 2016-10-15: qty 2000

## 2016-10-15 MED ORDER — MAGNESIUM SULFATE 2 GM/50ML IV SOLN
2.0000 g | Freq: Once | INTRAVENOUS | Status: AC
  Administered 2016-10-15: 2 g via INTRAVENOUS
  Filled 2016-10-15: qty 50

## 2016-10-15 MED ORDER — LEVOFLOXACIN IN D5W 750 MG/150ML IV SOLN
750.0000 mg | INTRAVENOUS | Status: AC
  Administered 2016-10-15 – 2016-10-19 (×5): 750 mg via INTRAVENOUS
  Filled 2016-10-15 (×5): qty 150

## 2016-10-15 MED ORDER — AMIODARONE LOAD VIA INFUSION
150.0000 mg | Freq: Once | INTRAVENOUS | Status: AC
  Administered 2016-10-15: 150 mg via INTRAVENOUS
  Filled 2016-10-15: qty 83.34

## 2016-10-15 MED ORDER — AMIODARONE HCL IN DEXTROSE 360-4.14 MG/200ML-% IV SOLN
60.0000 mg/h | INTRAVENOUS | Status: AC
  Administered 2016-10-15 (×2): 60 mg/h via INTRAVENOUS
  Filled 2016-10-15 (×2): qty 200

## 2016-10-15 MED ORDER — VANCOMYCIN HCL 10 G IV SOLR
1250.0000 mg | Freq: Three times a day (TID) | INTRAVENOUS | Status: DC
  Administered 2016-10-15 – 2016-10-17 (×6): 1250 mg via INTRAVENOUS
  Filled 2016-10-15 (×7): qty 1250

## 2016-10-15 NOTE — Progress Notes (Signed)
  Echocardiogram 2D Echocardiogram has been performed.  Roberto [name] 10/15/2016, 12:05 PM

## 2016-10-15 NOTE — Progress Notes (Signed)
Received a call from CCMD informing that patients HR went up to 170's. I immediately went in the pts room, pt stated " I was just making myself comfortable in bed". Pt no complained of any discomfort or chest pain and did not feel his heart beating fast. Will monitor accordingly.

## 2016-10-15 NOTE — Progress Notes (Signed)
Patient had 7 beats VT run. Patient asymptomatic. VS stable. Magnesium and Potassium within normal limit. MD on call Virgina Organ notified. Will continue to monitor.  Dominion Kathan, RN

## 2016-10-15 NOTE — Progress Notes (Signed)
Patient slept during the night.  No complaints or concerns. Stated that he feels better this morning however, still little tired and short of breath. Vital signs stable. Will continue to monitor.  Lavaughn Haberle, RN

## 2016-10-15 NOTE — Progress Notes (Signed)
Advanced Heart Failure Rounding Note  Primary Cardiologist: Dr. Gala [name]   Subjective:    Admitted from Med center 10/14/16 with marked volume overload and cellulitis. (Had recently been started on ABX).   CXR 10/14/16 with mild CHF and no alveolar PNA.   Tmax 100. With bloody sputum since yesterday. WBC 14.2 this am. Pressures soft in 80s all through the day yesterday and overnight.   Breathing better. States this all really hit him yesterday. Subjective fever. SOB with mild exertion. Denies orthopnea. No lightheadedness or dizziness. Now with bloody sputum, starting yesterday. States his cellulitis has improved since being on Keflex.   Negative 1.3 L. Weight unchanged. Creatinine and K stable.   Objective:   Weight Range: 291 lb 3.2 oz (132.1 kg) Body mass index is 44.28 kg/m.   Vital Signs:   Temp:  [98.5 F (36.9 C)-100 F (37.8 C)] 99 F (37.2 C) (10/03 0743) Pulse Rate:  [67-95] 88 (10/03 0743) Resp:  [15-38] 18 (10/03 0743) BP: (81-110)/(46-74) 86/66 (10/03 0743) SpO2:  [92 %-100 %] 92 % (10/03 0743) Weight:  [291 lb (132 kg)-297 lb 3.2 oz (134.8 kg)] 291 lb 3.2 oz (132.1 kg) (10/03 0515) Last BM Date: 10/14/16  Weight change: Filed Weights   10/14/16 0808 10/14/16 1739 10/15/16 0515  Weight: 297 lb 3.2 oz (134.8 kg) 291 lb (132 kg) 291 lb 3.2 oz (132.1 kg)    Intake/Output:   Intake/Output Summary (Last 24 hours) at 10/15/16 0800 Last data filed at 10/15/16 0542  Gross per 24 hour  Intake              150 ml  Output             1450 ml  Net            -1300 ml      Physical Exam    General:  NAD  HEENT: Normal Neck: Supple. JVP 8-9 cm+ with mild HJR.  Carotids 2+ bilat; no bruits. No lymphadenopathy or thyromegaly appreciated. Cor: PMI nondisplaced. Regular rate & rhythm. No rubs, gallops or murmurs. Lungs: Clear Abdomen: Soft, nontender, nondistended. No hepatosplenomegaly. No bruits or masses. Good bowel sounds. Extremities: No cyanosis,  clubbing, rash, edema Neuro: Alert & orientedx3, cranial nerves grossly intact. moves all 4 extremities w/o difficulty. Affect pleasant Skin: Hot to the touch.   Telemetry   NSR, Personally reviewed.  EKG    NSR with PVCs at 90 bpm on admit.   Labs    CBC  Recent Labs  10/14/16 0740  WBC 14.2*  HGB 14.0  HCT 42.8  MCV 88.6  PLT 218   Basic Metabolic Panel  Recent Labs  10/14/16 0740 10/14/16 2234  NA 135 133*  K 3.9 3.8  CL 99* 97*  CO2 27 30  GLUCOSE 129* 92  BUN 16 16  CREATININE 1.06 1.21  CALCIUM 8.6* 8.6*  MG  --  1.8   Liver Function Tests  Recent Labs  10/14/16 0740  AST 27  ALT 22  ALKPHOS 41  BILITOT 0.6  PROT 7.5  ALBUMIN 3.3*   No results for input(s): LIPASE, AMYLASE in the last 72 hours. Cardiac Enzymes  Recent Labs  10/14/16 0740  TROPONINI 0.03*    BNP: BNP (last 3 results)  Recent Labs  10/14/16 0740  BNP 210.2*    ProBNP (last 3 results) No results for input(s): PROBNP in the last 8760 hours.   D-Dimer No results for input(s): DDIMER in the  last 72 hours. Hemoglobin A1C No results for input(s): HGBA1C in the last 72 hours. Fasting Lipid Panel No results for input(s): CHOL, HDL, LDLCALC, TRIG, CHOLHDL, LDLDIRECT in the last 72 hours. Thyroid Function Tests  Recent Labs  10/14/16 2234  TSH 1.946    Other results:   Imaging    Dg Chest Portable 1 View  Result Date: 10/14/2016 CLINICAL DATA:  Shortness of breath, CHF, morbid obesity, history of previous pulmonary embolism, never smoked. EXAM: PORTABLE CHEST 1 VIEW COMPARISON:  Chest x-ray of December 16, 2011 FINDINGS: The lungs are adequately inflated. The cardiac silhouette is enlarged. The pulmonary vascularity is mildly engorged. There is no pleural effusion or alveolar infiltrate. The trachea is midline. The observed bony thorax is unremarkable. IMPRESSION: Findings consistent with mild CHF. Stable cardiomegaly. No alveolar pneumonia. Electronically  Signed   By: David  [place] M.D.   On: 10/14/2016 08:09      Medications:     Scheduled Medications: . cephALEXin  500 mg Oral QID  . digoxin  0.125 mg Oral Daily  . furosemide  80 mg Intravenous BID  . mouth rinse  15 mL Mouth Rinse BID  . potassium chloride  20 mEq Oral BID  . sodium chloride flush  3 mL Intravenous Q12H  . warfarin  10 mg Oral q1800  . Warfarin - Pharmacist Dosing Inpatient   Does not apply q1800     Infusions: . sodium chloride       PRN Medications:  sodium chloride, acetaminophen, guaiFENesin, ondansetron (ZOFRAN) IV, sodium chloride flush    Patient Profile   [name] is a 51 year old with a history of DVT, mural thrombus, NICM, paroxysmal SVT, PE, and morbid obesity admitted with A/C systolic heart failure.   Assessment/Plan   1. Acute on chronic systolic CHF: NICM - Echo 2016 LVEF 40-45%. Repeat pending - NYHA III symptoms - Volume status remains elevated  - Continue lasix 80 mg BID - Hold BB with acute decompensation - Holding spiro and losartan with hypotension. Will resume as tolerated   2. Morbid obesity - Body mass index is 44.28 kg/m.   3. H/o DVT/PE - On coumadin. INR 2.86 this am.  - Dosing per pharm.   4. Cellulitis - Continue Keflex.  - WBC 14.2 10/14/16  5. ID - Low grade fever overnight and now with bloody sputum. - WBC 14.2 - Check BCx and UCx. - Check PCT - Check Sputum Culture - Expand ABX after BCx. Will discuss optimal coverage with Pharm-D.  - Have some concern for early sepsis. If pressure drops or becomes symptomatic, consider stepdown for pressor support.   6. PAF / ? Paroxysmal SVT - History per patient - NSR this am, then noted to go into Aflutter - Start amiodarone infusion. - With therapeutic INR can consider DCCV if does not convert on amio.  - This patients CHA2DS2-VASc Score of at least 1.   Length of Stay: 1  Luane School  10/15/2016, 8:00 AM  Advanced Heart Failure  Team Pager [phone] (M-F; 7a - 4p)  Please contact [facility] for night-coverage after hours (4p -7a ) and weekends on [email or web address]  Patient seen and examined with the above-signed Advanced Practice Provider and/or Housestaff. I personally reviewed laboratory data, imaging studies and relevant notes. I independently examined the patient and formulated the important aspects of the plan. I have edited the note to reflect any of my changes or salient points. I have personally discussed the plan  with the patient and/or family.   He remains tenuous. He does appear to have some volume overload but now with increasing fevers and productive cough suspicious for PNA. He has also developed AFL. BP soft 80-90s. CXR viewed personally and does not show clear infiltrate. Will treat with broad spectrum abx. Can stop Keflex. Hold diuresis for now. Start amio for AFL. Continue anticoagulation. Recheck echo. May need RHC prior to d/c.   Arvilla Meres, MD  1:37 PM

## 2016-10-15 NOTE — Progress Notes (Addendum)
ANTICOAGULATION CONSULT NOTE - Follow Up Consult  Pharmacy Consult for Warfarin  Indication: LV thrombus  Allergies  Allergen Reactions  . Shrimp [Shellfish Allergy] Hives and Itching    Patient Measurements: Height:  (172.7 cm) Weight: 291 lb 3.2 oz (132.1 kg) IBW/kg (Calculated) : 68.4  Vital Signs: Temp: 99 F (37.2 C) (10/03 0743) Temp Source: Oral (10/03 0743) BP: 86/66 (10/03 0743) Pulse Rate: 88 (10/03 0743)  Labs:  Recent Labs  10/14/16 0740 10/14/16 2234 10/15/16 0713  HGB 14.0  --   --   HCT 42.8  --   --   PLT 218  --   --   LABPROT 28.9* 29.0* 29.8*  INR 2.75 2.76 2.86  CREATININE 1.06 1.21 1.23  TROPONINI 0.03*  --   --     Estimated Creatinine Clearance: 94.4 mL/min (by C-G formula based on SCr of 1.23 mg/dL).   Medical History: Past Medical History:  Diagnosis Date  . Arthritis   . Chronic combined systolic and diastolic CHF (congestive heart failure) (HCC)   . Deep vein thrombosis (HCC)   . Lupus anticoagulant positive   . Morbid obesity (HCC)   . Mural thrombus of heart    coumadin  . Nonischemic cardiomyopathy (HCC)    a) 12/17/11 echo: LVEF 25-30%, grade 3 diastolic dysfunction (c/w restriction), mod MR, mod LA/LV and mild RA dilatation; b) 12/18/11 cMRI: LVEF 38%, mod LV/mild RV dilatation, global HK, mild-mod RV sys dysfxn, no LV thrombus & patchy non-subendocardial delayed enhancement c/w infil dz or prior myocarditis   . Paroxysmal SVT (supraventricular tachycardia) (HCC)   . Pulmonary embolism (HCC)    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    Medications:  Prescriptions Prior to Admission  Medication Sig Dispense Refill Last Dose  . carvedilol (COREG) 12.5 MG tablet TAKE ONE TABLET BY MOUTH TWICE DAILY WITH MEALS (Patient taking differently: Take 12.5 mg by mouth 2 (two) times daily with a meal. ) 180 tablet 3 10/14/2016 at 0600  . cephALEXin (KEFLEX) 500 MG capsule Take 500 mg by mouth 4 (four) times daily.  0 10/14/2016 at  Unknown time  . CONTRAVE 8-90 MG TB12 TAKE 2 TABLETS BY MOUTH TWICE DAILY 360 tablet 0 10/14/2016 at 0600  . digoxin (DIGOX) 0.125 MG tablet TAKE 1 TABLET(0.125 MG) BY MOUTH DAILY 90 tablet 3 10/14/2016 at Unknown time  . furosemide (LASIX) 80 MG tablet Take 80 mg by mouth 2 (two) times daily.   10/14/2016 at 0600  . losartan (COZAAR) 100 MG tablet TAKE ONE TABLET BY MOUTH ONCE DAILY (Patient taking differently: Take 100 mg by mouth daily. ) 90 tablet 3 10/14/2016 at Unknown time  . potassium chloride SA (K-DUR,KLOR-CON) 20 MEQ tablet Take 1 tablet (20 mEq total) by mouth 2 (two) times daily. 180 tablet 3 10/14/2016 at 0600  . spironolactone (ALDACTONE) 25 MG tablet Take 1 tablet (25 mg total) by mouth daily. 90 tablet 3 10/14/2016 at Unknown time  . warfarin (COUMADIN) 10 MG tablet TAKE 1 TABLET BY MOUTH DAILY AT 6PM (Patient taking differently: TAKE 1 TABLET ( ) BY MOUTH DAILY AT 6PM.) 90 tablet 0 10/14/2016 at 0600    Assessment: 51 YOM with mural LV thrombus on warfarin at home. Pharmacy consulted to resume warfarin. INR on admission is therapeutic at 2.75 he had already taken dose for the day. INR 2.8 today  H/H and Plt wnl. No significant drug interactions noted. Home warfarin dose: 10 mg daily   Goal of  Therapy:  INR 2-3 Monitor platelets by anticoagulation protocol: Yes   Plan:  -Resume home warfarin dose of 10 mg daily -Monitor daily PT/INR and for s/s of bleeding   Leota Sauers Pharm.D. CPP, BCPS Clinical Pharmacist [phone] 10/15/2016 8:35 AM

## 2016-10-15 NOTE — Progress Notes (Addendum)
   Paged for tachycardia. Asymptomatic   Tele reviewed with rates in 140-150s, then stabilizing into 120s.  EKG with Aflutter, per pt he has history of same. Likely brought on by infection.   Will start on IV amiodarone. He has been therapeutic on Coumadin, if does not convert chemically, consider DCCV.     Casimiro Needle [address]" [place], PA-C 10/15/2016 10:54 AM

## 2016-10-15 NOTE — Progress Notes (Signed)
12nn Amiodarone gtt started. CCMD made aware. Will monitor.

## 2016-10-15 NOTE — Progress Notes (Addendum)
Pharmacy Antibiotic Note  Roberto [name] is a 51 y.o. male admitted on 10/14/2016 with soft BP 80s, Temp 99-100, WBC 14 and recent outpatient treatment for R foot cellulitis with keflex, now with cough and bloody sputum.  Pharmacy has been consulted for Levofloxacin and Vancomycin  dosing.  Plan: Levofloxacin 750mg  IV q24hr x5 days Vancomycin 2gm x1 then 1250mg  IV q8hr   Height: 5\' 8"  (172.7 cm) Weight: 291 lb 3.2 oz (132.1 kg) IBW/kg (Calculated) : 68.4  Temp (24hrs), Avg:99 F (37.2 C), Min:98.5 F (36.9 C), Max:100 F (37.8 C)   Recent Labs Lab 10/14/16 0740 10/14/16 2234 10/15/16 0713  WBC 14.2*  --   --   CREATININE 1.06 1.21 1.23    Estimated Creatinine Clearance: 94.4 mL/min (by C-G formula based on SCr of 1.23 mg/dL).    Allergies  Allergen Reactions  . Shrimp [Shellfish Allergy] Hives and Itching    Antimicrobials this admission:  Keflex  Dose adjustments this admission:   Microbiology results: Bcx UCx Sputum Cx  Leota Sauers Pharm.D. CPP, BCPS Clinical Pharmacist [phone] 10/15/2016 9:05 AM

## 2016-10-15 NOTE — Progress Notes (Signed)
Roberto [name] ,made aware of pt having SVT - HR went up to 170's , SBP 90'swith orders for EKG, present HR 128.

## 2016-10-15 NOTE — Care Management Note (Signed)
Case Management Note  Patient Details  Name: VIRGEL ARPINO MRN: [id number] Date of Birth: Oct 10, 1965  Subjective/Objective:   CHF                Action/Plan: Patient lives at home; Primary Physician:  Dr Susann Givens; has private insurance with BCBS with prescription drug coverage; Tachycardiac, IV amiodarone; CM following for DCP  Expected Discharge Date:   possibly 10/19/2016               Expected Discharge Plan:  Home w Home Health Services  Discharge planning Services  CM Consult  Status of Service:  In process, will continue to follow  Reola Mosher [phone] 10/15/2016, 11:26 AM

## 2016-10-16 DIAGNOSIS — I4891 Unspecified atrial fibrillation: Secondary | ICD-10-CM

## 2016-10-16 LAB — CBC
HCT: 41.8 % (ref 39.0–52.0)
HEMOGLOBIN: 13.6 g/dL (ref 13.0–17.0)
MCH: 29.1 pg (ref 26.0–34.0)
MCHC: 32.5 g/dL (ref 30.0–36.0)
MCV: 89.3 fL (ref 78.0–100.0)
Platelets: 195 10*3/uL (ref 150–400)
RBC: 4.68 MIL/uL (ref 4.22–5.81)
RDW: 13.7 % (ref 11.5–15.5)
WBC: 12.3 10*3/uL — AB (ref 4.0–10.5)

## 2016-10-16 LAB — PROTIME-INR
INR: 3.07
PROTHROMBIN TIME: 31.5 s — AB (ref 11.4–15.2)

## 2016-10-16 LAB — EXPECTORATED SPUTUM ASSESSMENT W REFEX TO RESP CULTURE

## 2016-10-16 LAB — BASIC METABOLIC PANEL
ANION GAP: 8 (ref 5–15)
BUN: 14 mg/dL (ref 6–20)
CALCIUM: 8.1 mg/dL — AB (ref 8.9–10.3)
CO2: 28 mmol/L (ref 22–32)
Chloride: 98 mmol/L — ABNORMAL LOW (ref 101–111)
Creatinine, Ser: 0.97 mg/dL (ref 0.61–1.24)
GLUCOSE: 132 mg/dL — AB (ref 65–99)
Potassium: 3.6 mmol/L (ref 3.5–5.1)
SODIUM: 134 mmol/L — AB (ref 135–145)

## 2016-10-16 LAB — LACTIC ACID, PLASMA: Lactic Acid, Venous: 1.3 mmol/L (ref 0.5–1.9)

## 2016-10-16 LAB — GLUCOSE, CAPILLARY
GLUCOSE-CAPILLARY: 120 mg/dL — AB (ref 65–99)
Glucose-Capillary: 91 mg/dL (ref 65–99)

## 2016-10-16 LAB — PROCALCITONIN

## 2016-10-16 LAB — EXPECTORATED SPUTUM ASSESSMENT W GRAM STAIN, RFLX TO RESP C

## 2016-10-16 MED ORDER — SODIUM CHLORIDE 0.9% FLUSH
3.0000 mL | Freq: Two times a day (BID) | INTRAVENOUS | Status: DC
  Administered 2016-10-16 – 2016-10-20 (×6): 3 mL via INTRAVENOUS

## 2016-10-16 MED ORDER — POTASSIUM CHLORIDE CRYS ER 20 MEQ PO TBCR
60.0000 meq | EXTENDED_RELEASE_TABLET | Freq: Once | ORAL | Status: AC
  Administered 2016-10-16: 60 meq via ORAL
  Filled 2016-10-16: qty 3

## 2016-10-16 MED ORDER — FUROSEMIDE 10 MG/ML IJ SOLN
80.0000 mg | Freq: Two times a day (BID) | INTRAMUSCULAR | Status: DC
  Administered 2016-10-16 – 2016-10-19 (×8): 80 mg via INTRAVENOUS
  Filled 2016-10-16 (×8): qty 8

## 2016-10-16 MED ORDER — SODIUM CHLORIDE 0.9% FLUSH
3.0000 mL | INTRAVENOUS | Status: DC | PRN
  Administered 2016-10-20: 3 mL via INTRAVENOUS
  Filled 2016-10-16: qty 3

## 2016-10-16 MED ORDER — WARFARIN SODIUM 5 MG PO TABS
5.0000 mg | ORAL_TABLET | Freq: Every day | ORAL | Status: DC
  Administered 2016-10-16: 5 mg via ORAL
  Filled 2016-10-16: qty 1

## 2016-10-16 MED ORDER — SODIUM CHLORIDE 0.9 % IV SOLN: 250.0000 mL | INTRAVENOUS | Status: DC

## 2016-10-16 NOTE — Progress Notes (Signed)
  Remains in A fib with rate > 100. INR has been therapeutic.   Increase amio to 60 mg per hour.  DC-CV set for tomorrow at [phone] .  Yamila Cragin NP_C  3:25 PM

## 2016-10-16 NOTE — Anesthesia Preprocedure Evaluation (Addendum)
Anesthesia Evaluation  Patient identified by MRN, date of birth, ID band Patient awake    Reviewed: Allergy & Precautions, NPO status , Patient's Chart, lab work & pertinent test results  Airway Mallampati: III  TM Distance: >3 FB Neck ROM: Full    Dental   Pulmonary pneumonia,    breath sounds clear to auscultation       Cardiovascular hypertension, Pt. on medications and Pt. on home beta blockers + Peripheral Vascular Disease and +CHF  + dysrhythmias Atrial Fibrillation  Rhythm:Irregular Rate:Tachycardia  Impressions:  - Moderately dilated LV with EF 15%, diffuse hypokinesis.   Moderately dilated RV with moderately decreased systolic function. Dilated IVC suggestive of elevated RV filling pressure.   Neuro/Psych negative neurological ROS     GI/Hepatic negative GI ROS, Neg liver ROS,   Endo/Other  negative endocrine ROS  Renal/GU negative Renal ROS     Musculoskeletal  (+) Arthritis ,   Abdominal   Peds  Hematology negative hematology ROS (+)   Anesthesia Other Findings   Reproductive/Obstetrics                            Anesthesia Physical Anesthesia Plan  ASA: IV  Anesthesia Plan: General   Post-op Pain Management:    Induction: Intravenous  PONV Risk Score and Plan: 2 and Ondansetron, Propofol infusion and Treatment may vary due to age or medical condition  Airway Management Planned: Natural Airway and Mask  Additional Equipment:   Intra-op Plan:   Post-operative Plan:   Informed Consent: I have reviewed the patients History and Physical, chart, labs and discussed the procedure including the risks, benefits and alternatives for the proposed anesthesia with the patient or authorized representative who has indicated his/her understanding and acceptance.     Plan Discussed with: CRNA  Anesthesia Plan Comments:        Anesthesia Quick Evaluation

## 2016-10-16 NOTE — Progress Notes (Signed)
ANTICOAGULATION CONSULT NOTE - Follow Up Consult  Pharmacy Consult for Warfarin  Indication: LV thrombus  Allergies  Allergen Reactions  . Shrimp [Shellfish Allergy] Hives and Itching    Patient Measurements: Height: 5\' 8"  (172.7 cm) Weight: 296 lb 12.8 oz (134.6 kg) (scale c) IBW/kg (Calculated) : 68.4  Vital Signs: Temp: 98 F (36.7 C) (10/04 0452) Temp Source: Oral (10/04 0452) BP: 95/66 (10/04 0900) Pulse Rate: 116 (10/04 0900)  Labs:  Recent Labs  10/14/16 0740 10/14/16 2234 10/15/16 0713 10/16/16 0840  HGB 14.0  --   --  13.6  HCT 42.8  --   --  41.8  PLT 218  --   --  195  LABPROT 28.9* 29.0* 29.8* 31.5*  INR 2.75 2.76 2.86 3.07  CREATININE 1.06 1.21 1.23 0.97  TROPONINI 0.03*  --   --   --     Estimated Creatinine Clearance: 120.9 mL/min (by C-G formula based on SCr of 0.97 mg/dL).  Medical History: Past Medical History:  Diagnosis Date  . Arthritis   . Chronic combined systolic and diastolic CHF (congestive heart failure) (HCC)   . Deep vein thrombosis (HCC)   . Lupus anticoagulant positive   . Morbid obesity (HCC)   . Mural thrombus of heart    coumadin  . Nonischemic cardiomyopathy (HCC)    a) 12/17/11 echo: LVEF 25-30%, grade 3 diastolic dysfunction (c/w restriction), mod MR, mod LA/LV and mild RA dilatation; b) 12/18/11 cMRI: LVEF 38%, mod LV/mild RV dilatation, global HK, mild-mod RV sys dysfxn, no LV thrombus & patchy non-subendocardial delayed enhancement c/w infil dz or prior myocarditis   . Paroxysmal SVT (supraventricular tachycardia) (HCC)   . Pulmonary embolism (HCC)    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   Assessment: 51 YOM with mural LV thrombus on warfarin at home. Pharmacy consulted to resume warfarin. INR on admission is therapeutic at 2.75 he had already taken dose for the day.  INR up to 3 today, this is above his goal per outpatient records. H/H and Plt wnl. Patient started on IV amiodarone and levofloxacin yesterday, will  need to follow INR closely.   Home warfarin dose: 10 mg daily   Goal of Therapy:  INR 2-2.5 Monitor platelets by anticoagulation protocol: Yes   Plan:  -Warfarin 5mg  tonight -Monitor daily PT/INR and for s/s of bleeding   Sheppard Coil PharmD., BCPS Clinical Pharmacist Pager [phone] 10/16/2016 10:20 AM

## 2016-10-16 NOTE — Progress Notes (Signed)
Advanced Heart Failure Rounding Note  Primary Cardiologist: Dr. Gala Romney   Subjective:    Admitted from Med center 10/14/16 with marked volume overload and cellulitis. (Had recently been started on ABX).   CXR 10/14/16 with mild CHF and no alveolar PNA.   Yesterday developed A fib RVR. Started on amio drip and diuresed with IV lasix. Weight trending up. Antibiotics broadened. Bld CX - NGT. Urine CX - NGTD.    Overall feeling better. Denies SOB.   ECHO EF 15%.  Objective:   Weight Range: 296 lb 12.8 oz (134.6 kg) Body mass index is 45.13 kg/m.   Vital Signs:   Temp:  [98 F (36.7 C)-99.3 F (37.4 C)] 98 F (36.7 C) (10/04 0452) Pulse Rate:  [54-123] 116 (10/04 0900) Resp:  [18-20] 20 (10/04 0452) BP: (84-131)/(42-114) 95/66 (10/04 0900) SpO2:  [95 %-98 %] 97 % (10/04 0452) Weight:  [296 lb 12.8 oz (134.6 kg)] 296 lb 12.8 oz (134.6 kg) (10/04 0452) Last BM Date: 10/14/16  Weight change: Filed Weights   10/14/16 1739 10/15/16 0515 10/16/16 0452  Weight: 291 lb (132 kg) 291 lb 3.2 oz (132.1 kg) 296 lb 12.8 oz (134.6 kg)    Intake/Output:   Intake/Output Summary (Last 24 hours) at 10/16/16 1052 Last data filed at 10/16/16 0900  Gross per 24 hour  Intake          1332.91 ml  Output              400 ml  Net           932.91 ml      Physical Exam    General:  Well appearing. No resp difficulty. Sitting on the side of the bed.  HEENT: normal Neck: supple. JVP ~10. Carotids 2+ bilat; no bruits. No lymphadenopathy or thryomegaly appreciated. Cor: PMI nondisplaced. Irregular rate & rhythm. No rubs, gallops or murmurs. Lungs: clear Abdomen: obses,  soft, nontender, nondistended. No hepatosplenomegaly. No bruits or masses. Good bowel sounds. Extremities: no cyanosis, clubbing, rash, edema L 1+ >R trace  Neuro: alert & orientedx3, cranial nerves grossly intact. moves all 4 extremities w/o difficulty. Affect pleasant    Telemetry   A fib 110s personally reviewed.    EKG    NSR with PVCs at 90 bpm on admit.   Labs    CBC  Recent Labs  10/14/16 0740 10/16/16 0840  WBC 14.2* 12.3*  HGB 14.0 13.6  HCT 42.8 41.8  MCV 88.6 89.3  PLT 218 195   Basic Metabolic Panel  Recent Labs  10/14/16 2234 10/15/16 0713 10/16/16 0840  NA 133* 135 134*  K 3.8 4.2 3.6  CL 97* 96* 98*  CO2 30 31 28   GLUCOSE 92 104* 132*  BUN 16 16 14   CREATININE 1.21 1.23 0.97  CALCIUM 8.6* 8.3* 8.1*  MG 1.8  --   --    Liver Function Tests  Recent Labs  10/14/16 0740  AST 27  ALT 22  ALKPHOS 41  BILITOT 0.6  PROT 7.5  ALBUMIN 3.3*   No results for input(s): LIPASE, AMYLASE in the last 72 hours. Cardiac Enzymes  Recent Labs  10/14/16 0740  TROPONINI 0.03*    BNP: BNP (last 3 results)  Recent Labs  10/14/16 0740  BNP 210.2*    ProBNP (last 3 results) No results for input(s): PROBNP in the last 8760 hours.   D-Dimer No results for input(s): DDIMER in the last 72 hours. Hemoglobin A1C No results  for input(s): HGBA1C in the last 72 hours. Fasting Lipid Panel No results for input(s): CHOL, HDL, LDLCALC, TRIG, CHOLHDL, LDLDIRECT in the last 72 hours. Thyroid Function Tests  Recent Labs  10/14/16 2234  TSH 1.946    Other results:   Imaging    No results found.   Medications:     Scheduled Medications: . digoxin  0.125 mg Oral Daily  . mouth rinse  15 mL Mouth Rinse BID  . sodium chloride flush  3 mL Intravenous Q12H  . warfarin  5 mg Oral q1800  . Warfarin - Pharmacist Dosing Inpatient   Does not apply q1800    Infusions: . sodium chloride    . amiodarone 30 mg/hr (10/16/16 0111)  . levofloxacin (LEVAQUIN) IV 750 mg (10/16/16 0852)  . vancomycin Stopped (10/16/16 0538)    PRN Medications: sodium chloride, acetaminophen, guaiFENesin, ondansetron (ZOFRAN) IV, sodium chloride flush    Patient Profile   [name] is a 51 year old with a history of DVT, mural thrombus, NICM, paroxysmal SVT, PE, and morbid  obesity admitted with A/C systolic heart failure.   Assessment/Plan   1. Acute on chronic systolic CHF: NICM - Echo 2016 LVEF 40-45%. ECHO down to 15% likely precipitated by infection/ A Fib RVR.    - NYHA III symptoms improving.  Volume status elevated. Weight up . Continue lasix 80 mg twice a day.  - Hold BB with acute decompensation - Holding spiro and losartan with hypotension. Will resume as tolerated  2. Morbid obesity - Body mass index is 45.13 kg/m.  3. H/o DVT/PE - On coumadin. INR 3.1 Discussed dosing with Pharmacy.   4. Cellulitis - Now on vanc and levaquin.   - WBC trending down.  5. ID - T Max 99.3 - WBC 12.3   BCx NGT and UA negative. [name]  PCT was normal.  Sputum Culture NGTD 6. A fib RVR  Continue to load on IV amio. INR 3.1 - This patients CHA2DS2-VASc Score of at least 2.   Length of Stay: 2  Tonye Becket, NP  10/16/2016, 10:52 AM  Advanced Heart Failure Team Pager [phone] (M-F; 7a - 4p)  Please contact [facility] for night-coverage after hours (4p -7a ) and weekends on [email or web address]  Patient seen and examined with Tonye Becket, NP. We discussed all aspects of the encounter. I agree with the assessment and plan as stated above.   Echo reviewed personally EF down to 15%. Suspect combination of HF with volume overload and possible PNA/bronchitis. Remains volume overloaded. Continue IV lasix. Continue broad spectrum abx. WBC coming down. Cx remain negative. Remains in AF with RVR. On IV amio. DCCV set up for tomorrow. INRs have been therapeutic by his report.   Arvilla Meres, MD  3:33 PM

## 2016-10-16 NOTE — Progress Notes (Signed)
Pt blood pressure remains soft but pt is asymptomatic. His heart rate was in 90's while sleep but reaches up to 130 when awake. He is still producing blood tinged sputum  that was collected. He denies any pain. Will continue to monitor.   Elsie Lincoln, RN

## 2016-10-16 NOTE — Progress Notes (Signed)
Patient alarmed 8 beats v-tach. Patient asymptomatic and laying in bed.  Tonye Becket, NP made aware. Will continue to monitor.

## 2016-10-17 ENCOUNTER — Encounter (HOSPITAL_COMMUNITY)
Admission: EM | Disposition: A | Discharge status: Home / Self Care | Payer: Self-pay | Source: Home / Self Care | Attending: Cardiology

## 2016-10-17 ENCOUNTER — Inpatient Hospital Stay (HOSPITAL_COMMUNITY): Payer: BC Managed Care – PPO | Admitting: Anesthesiology

## 2016-10-17 ENCOUNTER — Encounter (HOSPITAL_COMMUNITY): Payer: Self-pay | Admitting: *Deleted

## 2016-10-17 HISTORY — PX: CARDIOVERSION: SHX1299

## 2016-10-17 LAB — CBC
HEMATOCRIT: 41.8 % (ref 39.0–52.0)
HEMOGLOBIN: 13.2 g/dL (ref 13.0–17.0)
MCH: 28.3 pg (ref 26.0–34.0)
MCHC: 31.6 g/dL (ref 30.0–36.0)
MCV: 89.5 fL (ref 78.0–100.0)
Platelets: 211 10*3/uL (ref 150–400)
RBC: 4.67 MIL/uL (ref 4.22–5.81)
RDW: 13.7 % (ref 11.5–15.5)
WBC: 11.3 10*3/uL — ABNORMAL HIGH (ref 4.0–10.5)

## 2016-10-17 LAB — VANCOMYCIN, TROUGH: VANCOMYCIN TR: 21 ug/mL — AB (ref 15–20)

## 2016-10-17 LAB — PROCALCITONIN: Procalcitonin: <0.1 ng/mL

## 2016-10-17 LAB — BASIC METABOLIC PANEL
ANION GAP: 9 (ref 5–15)
BUN: 14 mg/dL (ref 6–20)
CALCIUM: 7.8 mg/dL — AB (ref 8.9–10.3)
CO2: 25 mmol/L (ref 22–32)
Chloride: 100 mmol/L — ABNORMAL LOW (ref 101–111)
Creatinine, Ser: 1 mg/dL (ref 0.61–1.24)
GFR calc non Af Amer: >60 mL/min (ref >60)
GLUCOSE: 117 mg/dL — AB (ref 65–99)
POTASSIUM: 3.4 mmol/L — AB (ref 3.5–5.1)
Sodium: 134 mmol/L — ABNORMAL LOW (ref 135–145)

## 2016-10-17 LAB — PROTIME-INR
INR: 3.13
Prothrombin Time: 32 seconds — ABNORMAL HIGH (ref 11.4–15.2)

## 2016-10-17 LAB — MAGNESIUM: Magnesium: 1.8 mg/dL (ref 1.7–2.4)

## 2016-10-17 SURGERY — CARDIOVERSION
Anesthesia: General

## 2016-10-17 MED ORDER — METOLAZONE 5 MG PO TABS
5.0000 mg | ORAL_TABLET | Freq: Every day | ORAL | Status: DC
  Administered 2016-10-17 – 2016-10-18 (×2): 5 mg via ORAL
  Filled 2016-10-17 (×2): qty 1

## 2016-10-17 MED ORDER — PHENYLEPHRINE HCL 10 MG/ML IJ SOLN
INTRAMUSCULAR | Status: DC | PRN
  Administered 2016-10-17: 80 ug via INTRAVENOUS

## 2016-10-17 MED ORDER — LIDOCAINE HCL (CARDIAC) 20 MG/ML IV SOLN
INTRAVENOUS | Status: DC | PRN
  Administered 2016-10-17: 60 mg via INTRATRACHEAL

## 2016-10-17 MED ORDER — MAGNESIUM SULFATE 2 GM/50ML IV SOLN
2.0000 g | Freq: Once | INTRAVENOUS | Status: AC
  Administered 2016-10-17: 2 g via INTRAVENOUS
  Filled 2016-10-17: qty 50

## 2016-10-17 MED ORDER — SPIRONOLACTONE 25 MG PO TABS
12.5000 mg | ORAL_TABLET | Freq: Every day | ORAL | Status: DC
  Administered 2016-10-17 – 2016-10-21 (×5): 12.5 mg via ORAL
  Filled 2016-10-17 (×7): qty 1

## 2016-10-17 MED ORDER — WARFARIN SODIUM 2.5 MG PO TABS
2.5000 mg | ORAL_TABLET | Freq: Once | ORAL | Status: AC
  Administered 2016-10-17: 2.5 mg via ORAL
  Filled 2016-10-17 (×2): qty 1

## 2016-10-17 MED ORDER — VANCOMYCIN HCL IN DEXTROSE 1-5 GM/200ML-% IV SOLN
1000.0000 mg | Freq: Three times a day (TID) | INTRAVENOUS | Status: DC
  Filled 2016-10-17: qty 200

## 2016-10-17 MED ORDER — POTASSIUM CHLORIDE CRYS ER 20 MEQ PO TBCR
40.0000 meq | EXTENDED_RELEASE_TABLET | Freq: Once | ORAL | Status: AC
  Administered 2016-10-17: 40 meq via ORAL
  Filled 2016-10-17: qty 2

## 2016-10-17 MED ORDER — ETOMIDATE 2 MG/ML IV SOLN
INTRAVENOUS | Status: DC | PRN
  Administered 2016-10-17: 14 mg via INTRAVENOUS

## 2016-10-17 MED ORDER — POTASSIUM CHLORIDE CRYS ER 20 MEQ PO TBCR
40.0000 meq | EXTENDED_RELEASE_TABLET | Freq: Once | ORAL | Status: AC
  Administered 2016-10-17: 40 meq via ORAL
  Filled 2016-10-17 (×2): qty 2

## 2016-10-17 MED ORDER — VANCOMYCIN HCL 10 G IV SOLR
1500.0000 mg | Freq: Two times a day (BID) | INTRAVENOUS | Status: DC
  Administered 2016-10-17 – 2016-10-19 (×5): 1500 mg via INTRAVENOUS
  Filled 2016-10-17 (×7): qty 1500

## 2016-10-17 NOTE — Transfer of Care (Signed)
Immediate Anesthesia Transfer of Care Note  Patient: EDLEY OH  Procedure(s) Performed: CARDIOVERSION (N/A )  Patient Location: PACU and Endoscopy Unit  Anesthesia Type:General  Level of Consciousness: awake, alert , oriented and patient cooperative  Airway & Oxygen Therapy: Patient Spontanous Breathing and Patient connected to nasal cannula oxygen  Post-op Assessment: Report given to RN, Post -op Vital signs reviewed and stable and Patient moving all extremities  Post vital signs: Reviewed and stable  Last Vitals:  Vitals:   10/17/16 0749 10/17/16 0750  BP:    Pulse: 79 80  Resp: (!) 35   Temp:    SpO2: 92% 92%    Last Pain:  Vitals:   10/17/16 0655  TempSrc: Oral  PainSc:       Patients Stated Pain Goal: 2 (10/15/16 0834)  Complications: No apparent anesthesia complications

## 2016-10-17 NOTE — Progress Notes (Signed)
Advanced Heart Failure Rounding Note  Primary Cardiologist: Dr. Gala Romney   Subjective:    Admitted from Med center 10/14/16 with marked volume overload and cellulitis. (Had recently been started on ABX).   CXR 10/14/16 with mild CHF and no alveolar PNA.   Says he slept ok. Still coughing. Underwent successful DC-CV this am c/b prolonged myoclonic reaction.  Weight unchanged.   ECHO EF 15%.  Objective:   Weight Range: 297 lb (134.7 kg) Body mass index is 45.16 kg/m.   Vital Signs:   Temp:  [97.7 F (36.5 C)-98.4 F (36.9 C)] 97.7 F (36.5 C) (10/05 0655) Pulse Rate:  [112-123] 112 (10/05 0655) Resp:  [20-31] 31 (10/05 0655) BP: (91-103)/(64-73) 103/73 (10/05 0655) SpO2:  [95 %-99 %] 95 % (10/05 0655) Weight:  [297 lb (134.7 kg)] 297 lb (134.7 kg) (10/05 0417) Last BM Date: 10/16/16  Weight change: Filed Weights   10/15/16 0515 10/16/16 0452 10/17/16 0417  Weight: 291 lb 3.2 oz (132.1 kg) 296 lb 12.8 oz (134.6 kg) 297 lb (134.7 kg)    Intake/Output:   Intake/Output Summary (Last 24 hours) at 10/17/16 0750 Last data filed at 10/17/16 0451  Gross per 24 hour  Intake           1124.2 ml  Output             3130 ml  Net          -2005.8 ml      Physical Exam   General:  Lying in bed. No resp difficulty HEENT: normal Neck: supple. JVP to ear  Carotids 2+ bilat; no bruits. No lymphadenopathy or thryomegaly appreciated. Cor: PMI laterally displaced. Regular rate & rhythm. +s3 Lungs: crackles at bases Abdomen: obese soft, nontender, nondistended. No hepatosplenomegaly. No bruits or masses. Good bowel sounds. Extremities: no cyanosis, clubbing, rash, 3+ edema Neuro: alert & orientedx3, cranial nerves grossly intact. moves all 4 extremities w/o difficulty. Affect pleasant  Telemetry   NSR 70-80s Personally reviewed   EKG    NSR with PVCs at 90 bpm on admit.   Labs    CBC  Recent Labs  10/16/16 0840 10/17/16 0538  WBC 12.3* 11.3*  HGB 13.6 13.2    HCT 41.8 41.8  MCV 89.3 89.5  PLT 195 211   Basic Metabolic Panel  Recent Labs  10/14/16 2234  10/16/16 0840 10/17/16 0538  NA 133*  < > 134* 134*  K 3.8  < > 3.6 3.4*  CL 97*  < > 98* 100*  CO2 30  < > 28 25  GLUCOSE 92  < > 132* 117*  BUN 16  < > 14 14  CREATININE 1.21  < > 0.97 1.00  CALCIUM 8.6*  < > 8.1* 7.8*  MG 1.8  --   --  1.8  < > = values in this interval not displayed. Liver Function Tests No results for input(s): AST, ALT, ALKPHOS, BILITOT, PROT, ALBUMIN in the last 72 hours. No results for input(s): LIPASE, AMYLASE in the last 72 hours. Cardiac Enzymes No results for input(s): CKTOTAL, CKMB, CKMBINDEX, TROPONINI in the last 72 hours.  BNP: BNP (last 3 results)  Recent Labs  10/14/16 0740  BNP 210.2*    ProBNP (last 3 results) No results for input(s): PROBNP in the last 8760 hours.   D-Dimer No results for input(s): DDIMER in the last 72 hours. Hemoglobin A1C No results for input(s): HGBA1C in the last 72 hours. Fasting Lipid Panel No results  for input(s): CHOL, HDL, LDLCALC, TRIG, CHOLHDL, LDLDIRECT in the last 72 hours. Thyroid Function Tests  Recent Labs  10/14/16 2234  TSH 1.946    Other results:   Imaging    No results found.   Medications:     Scheduled Medications: . [MAR Hold] digoxin  0.125 mg Oral Daily  . [MAR Hold] furosemide  80 mg Intravenous Q12H  . [MAR Hold] mouth rinse  15 mL Mouth Rinse BID  . potassium chloride  40 mEq Oral Once  . [MAR Hold] sodium chloride flush  3 mL Intravenous Q12H  . [MAR Hold] sodium chloride flush  3 mL Intravenous Q12H  . warfarin  2.5 mg Oral ONCE-1800  . [MAR Hold] Warfarin - Pharmacist Dosing Inpatient   Does not apply q1800    Infusions: . [MAR Hold] sodium chloride    . sodium chloride    . amiodarone 60 mg/hr (10/17/16 0451)  . [MAR Hold] levofloxacin (LEVAQUIN) IV Stopped (10/16/16 1022)  . magnesium sulfate 1 - 4 g bolus IVPB    . [MAR Hold] vancomycin 1,250 mg  (10/17/16 0451)    PRN Medications: [MAR Hold] sodium chloride, [MAR Hold] acetaminophen, [MAR Hold] guaiFENesin, [MAR Hold] ondansetron (ZOFRAN) IV, [MAR Hold] sodium chloride flush, [MAR Hold] sodium chloride flush    Patient Profile   Mr Domangue is a 51 year old with a history of DVT, mural thrombus, NICM, paroxysmal SVT, PE, and morbid obesity admitted with A/C systolic heart failure.   Assessment/Plan   1. Acute on chronic systolic CHF: NICM - Echo 2016 LVEF 40-45%. ECHO down to 15% likely precipitated by infection/ A Fib RVR.    - Remains very tenuous with marked volume overload. If not improving with restoration of NSR may need inotropes.  - Continue IV lasix. Add metolazone - Hold BB with acute decompensation - Holding spiro and losartan with hypotension. Will resume as tolerated  2. Morbid obesity - Body mass index is 45.16 kg/m.  3. H/o DVT/PE - On coumadin. INR 3.1 Discussed dosing with PharmD 4. Cellulitis/bronchitis - Treated with vanc and levaquin.   - WBC trending down.  5. A fib RVR/atriall flutter  - s/p DC-CV this am. Continue to load  IV amio one more day.  - INR 3.1  6. Hypokalemia - will supp. Add back spiro  Length of Stay: 3  Deon Ivey, MD  10/17/2016, 7:50 AM  Advanced Heart Failure Team Pager [phone] (M-F; 7a - 4p)  Please contact [facility] for night-coverage after hours (4p -7a ) and weekends on [email or web address]

## 2016-10-17 NOTE — CV Procedure (Signed)
     DIRECT CURRENT CARDIOVERSION  NAME:  Roberto [name]   MRN: [id number] DOB:  [birth year]   ADMIT DATE: 10/14/2016   INDICATIONS: Atrial flutter   PROCEDURE:   Informed consent was obtained prior to the procedure. The risks, benefits and alternatives for the procedure were discussed and the patient comprehended these risks. Once an appropriate time out was taken, the patient had the defibrillator pads placed in the anterior and posterior position. The patient then underwent sedation by the anesthesia service. Once an appropriate level of sedation was achieved, with manual pressure on the anterior pad, the patient received a single biphasic, synchronized 200J shock with prompt conversion to sinus rhythm. He had a prolonged myoclonic reaction to the etomidate but recovered without incident.   Arvilla Meres, MD  7:49 AM  .

## 2016-10-17 NOTE — Anesthesia Postprocedure Evaluation (Signed)
Anesthesia Post Note  Patient: Roberto [name]  Procedure(s) Performed: CARDIOVERSION (N/A )     Patient location during evaluation: PACU Anesthesia Type: General Level of consciousness: awake and alert Pain management: pain level controlled Vital Signs Assessment: post-procedure vital signs reviewed and stable Respiratory status: spontaneous breathing, nonlabored ventilation, respiratory function stable and patient connected to nasal cannula oxygen Cardiovascular status: blood pressure returned to baseline and stable Postop Assessment: no apparent nausea or vomiting Anesthetic complications: no    Last Vitals:  Vitals:   10/17/16 0841 10/17/16 1018  BP: 91/64 95/65  Pulse: 78 80  Resp:    Temp: 36.8 C   SpO2: 94% 95%    Last Pain:  Vitals:   10/17/16 1018  TempSrc:   PainSc: 0-No pain                 Kennieth Rad

## 2016-10-17 NOTE — Progress Notes (Signed)
Pt educated on ways to reduce edema and activity intolerance. Pt was able to demonstrate teach-back.

## 2016-10-17 NOTE — H&P (View-Only) (Signed)
  Remains in A fib with rate > 100. INR has been therapeutic.   Increase amio to 60 mg per hour.  DC-CV set for tomorrow at 11415 .  Caisen Mangas NP_C  3:25 PM  

## 2016-10-17 NOTE — Interval H&P Note (Signed)
History and Physical Interval Note:  10/17/2016 7:48 AM  Roberto [name]  has presented today for surgery, with the diagnosis of afib/flutter The various methods of treatment have been discussed with the patient and family. After consideration of risks, benefits and other options for treatment, the patient has consented to  Procedure(s): CARDIOVERSION (N/A) as a surgical intervention .  [name] history has been reviewed, patient examined, no change in status, stable for surgery.  I have reviewed the patient's chart and labs.  Questions were answered to the patient's satisfaction.     [name], Reuel Boom

## 2016-10-17 NOTE — Progress Notes (Addendum)
Pharmacy Antibiotic Note  Roberto [name] is a 51 y.o. male admitted on 10/14/2016.   Patient afebrile overnight, wbc stable at 11. Scr has been within normal limits on IV diuretics. Patient s/p successful DCCV this am.  Vancomycin trough this afternoon is just above goal at 21. Noon dose was spiked by nurse but never given.   Plan: Continue Levofloxacin 750mg  IV q24hr for 2 more days Reduce vancomycin to 1500mg  q 12 hours, consider recheck trough over weekend if indicated  Height: 5\' 8"  (172.7 cm) Weight: 297 lb (134.7 kg) (scale c) IBW/kg (Calculated) : 68.4  Temp (24hrs), Avg:98 F (36.7 C), Min:97.7 F (36.5 C), Max:98.4 F (36.9 C)   Recent Labs Lab 10/14/16 0740 10/14/16 2234 10/15/16 0713 10/16/16 0840 10/17/16 0538 10/17/16 1152  WBC 14.2*  --   --  12.3* 11.3*  --   CREATININE 1.06 1.21 1.23 0.97 1.00  --   LATICACIDVEN  --   --   --  1.3  --   --   VANCOTROUGH  --   --   --   --   --  21*    Estimated Creatinine Clearance: 117.3 mL/min (by C-G formula based on SCr of 1 mg/dL).    Allergies  Allergen Reactions  . Shrimp [Shellfish Allergy] Hives and Itching    Antimicrobials this admission: Keflex 10/2>10/3  Dose adjustments this admission: 10/5 Vancomycin trough 21>>reduce dose to 1500 q12 hours  Microbiology results: 10/3 Bcx x2:ngtd 10/4 sputum rare gpc  Sheppard Coil PharmD., BCPS Clinical Pharmacist Pager [phone] 10/17/2016 1:21 PM

## 2016-10-17 NOTE — Progress Notes (Signed)
Pt taken to  Endo for TEE cardioversion.

## 2016-10-17 NOTE — Progress Notes (Signed)
ANTICOAGULATION CONSULT NOTE - Follow Up Consult  Pharmacy Consult for Warfarin  Indication: LV thrombus  Allergies  Allergen Reactions  . Shrimp [Shellfish Allergy] Hives and Itching    Patient Measurements: Height: 5\' 8"  (172.7 cm) Weight: 297 lb (134.7 kg) (scale c) IBW/kg (Calculated) : 68.4  Vital Signs: Temp: 97.7 F (36.5 C) (10/05 0655) Temp Source: Oral (10/05 0655) BP: 103/73 (10/05 0655) Pulse Rate: 112 (10/05 0655)  Labs:  Recent Labs  10/14/16 0740  10/15/16 0713 10/16/16 0840 10/17/16 0538  HGB 14.0  --   --  13.6 13.2  HCT 42.8  --   --  41.8 41.8  PLT 218  --   --  195 211  LABPROT 28.9*  < > 29.8* 31.5* 32.0*  INR 2.75  < > 2.86 3.07 3.13  CREATININE 1.06  < > 1.23 0.97 1.00  TROPONINI 0.03*  --   --   --   --   < > = values in this interval not displayed.  Estimated Creatinine Clearance: 117.3 mL/min (by C-G formula based on SCr of 1 mg/dL).  Medical History: Past Medical History:  Diagnosis Date  . Arthritis   . Chronic combined systolic and diastolic CHF (congestive heart failure) (HCC)   . Deep vein thrombosis (HCC)   . Lupus anticoagulant positive   . Morbid obesity (HCC)   . Mural thrombus of heart    coumadin  . Nonischemic cardiomyopathy (HCC)    a) 12/17/11 echo: LVEF 25-30%, grade 3 diastolic dysfunction (c/w restriction), mod MR, mod LA/LV and mild RA dilatation; b) 12/18/11 cMRI: LVEF 38%, mod LV/mild RV dilatation, global HK, mild-mod RV sys dysfxn, no LV thrombus & patchy non-subendocardial delayed enhancement c/w infil dz or prior myocarditis   . Paroxysmal SVT (supraventricular tachycardia) (HCC)   . Pulmonary embolism (HCC)    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   Assessment: 51 YOM with mural LV thrombus on warfarin at home. Pharmacy consulted to resume warfarin. INR on admission is therapeutic at 2.75 he had already taken dose for the day.  INR up to 3.1 this morning, he continues to be above his goal. No issues noted,  plan for cardioversion this morning so would prefer he stay on the higher end of goal.   Patient started on IV amiodarone and levofloxacin 10/2, will need to follow INR closely.   Home warfarin dose: 10 mg daily   Goal of Therapy:  INR 2-2.5 Monitor platelets by anticoagulation protocol: Yes   Plan:  -Warfarin 2.5mg  tonight -Monitor daily PT/INR and for s/s of bleeding   Sheppard Coil PharmD., BCPS Clinical Pharmacist Pager [phone] 10/17/2016 7:31 AM

## 2016-10-18 ENCOUNTER — Encounter (HOSPITAL_COMMUNITY): Payer: Self-pay | Admitting: Internal Medicine

## 2016-10-18 DIAGNOSIS — I480 Paroxysmal atrial fibrillation: Secondary | ICD-10-CM

## 2016-10-18 LAB — PROTIME-INR
INR: 3.01
PROTHROMBIN TIME: 31 s — AB (ref 11.4–15.2)

## 2016-10-18 LAB — BASIC METABOLIC PANEL
Anion gap: 12 (ref 5–15)
BUN: 13 mg/dL (ref 6–20)
CALCIUM: 8.3 mg/dL — AB (ref 8.9–10.3)
CO2: 29 mmol/L (ref 22–32)
CREATININE: 1.25 mg/dL — AB (ref 0.61–1.24)
Chloride: 93 mmol/L — ABNORMAL LOW (ref 101–111)
GFR calc Af Amer: >60 mL/min (ref >60)
GFR calc non Af Amer: >60 mL/min (ref >60)
Glucose, Bld: 156 mg/dL — ABNORMAL HIGH (ref 65–99)
Potassium: 3.3 mmol/L — ABNORMAL LOW (ref 3.5–5.1)
Sodium: 134 mmol/L — ABNORMAL LOW (ref 135–145)

## 2016-10-18 LAB — CBC
HCT: 41.5 % (ref 39.0–52.0)
Hemoglobin: 13.3 g/dL (ref 13.0–17.0)
MCH: 28.4 pg (ref 26.0–34.0)
MCHC: 32 g/dL (ref 30.0–36.0)
MCV: 88.5 fL (ref 78.0–100.0)
PLATELETS: 234 10*3/uL (ref 150–400)
RBC: 4.69 MIL/uL (ref 4.22–5.81)
RDW: 13.4 % (ref 11.5–15.5)
WBC: 14.7 10*3/uL — ABNORMAL HIGH (ref 4.0–10.5)

## 2016-10-18 LAB — CULTURE, RESPIRATORY W GRAM STAIN: Culture: NORMAL

## 2016-10-18 LAB — CULTURE, RESPIRATORY

## 2016-10-18 MED ORDER — WARFARIN SODIUM 5 MG PO TABS
5.0000 mg | ORAL_TABLET | Freq: Once | ORAL | Status: AC
  Administered 2016-10-18: 5 mg via ORAL
  Filled 2016-10-18: qty 1

## 2016-10-18 MED ORDER — AMIODARONE HCL 200 MG PO TABS
400.0000 mg | ORAL_TABLET | Freq: Two times a day (BID) | ORAL | Status: DC
  Administered 2016-10-18 – 2016-10-21 (×7): 400 mg via ORAL
  Filled 2016-10-18 (×7): qty 2

## 2016-10-18 MED ORDER — POTASSIUM CHLORIDE CRYS ER 20 MEQ PO TBCR
40.0000 meq | EXTENDED_RELEASE_TABLET | Freq: Once | ORAL | Status: AC
  Administered 2016-10-18: 40 meq via ORAL
  Filled 2016-10-18: qty 2

## 2016-10-18 MED ORDER — POTASSIUM CHLORIDE CRYS ER 10 MEQ PO TBCR
20.0000 meq | EXTENDED_RELEASE_TABLET | Freq: Two times a day (BID) | ORAL | Status: DC
  Administered 2016-10-18 – 2016-10-21 (×7): 20 meq via ORAL
  Filled 2016-10-18 (×10): qty 2

## 2016-10-18 MED ORDER — METOLAZONE 2.5 MG PO TABS
2.5000 mg | ORAL_TABLET | Freq: Every day | ORAL | Status: DC
  Administered 2016-10-19: 2.5 mg via ORAL
  Filled 2016-10-18 (×3): qty 1

## 2016-10-18 NOTE — Progress Notes (Signed)
ANTICOAGULATION CONSULT NOTE - Follow Up Consult  Pharmacy Consult for Warfarin  Indication: LV thrombus  Allergies  Allergen Reactions  . Shrimp [Shellfish Allergy] Hives and Itching    Patient Measurements: Height: 5\' 8"  (172.7 cm) Weight: 293 lb (132.9 kg) IBW/kg (Calculated) : 68.4  Vital Signs: Temp: 98.6 F (37 C) (10/06 0521) Temp Source: Oral (10/06 0521) BP: 100/63 (10/06 0521) Pulse Rate: 73 (10/06 0521)  Labs:  Recent Labs  10/16/16 0840 10/17/16 0538 10/18/16 0434  HGB 13.6 13.2 13.3  HCT 41.8 41.8 41.5  PLT 195 211 234  LABPROT 31.5* 32.0* 31.0*  INR 3.07 3.13 3.01  CREATININE 0.97 1.00 1.25*    Estimated Creatinine Clearance: 93.2 mL/min (A) (by C-G formula based on SCr of 1.25 mg/dL (H)).  Medical History: Past Medical History:  Diagnosis Date  . Arthritis   . Chronic combined systolic and diastolic CHF (congestive heart failure) (HCC)   . Deep vein thrombosis (HCC)   . Lupus anticoagulant positive   . Morbid obesity (HCC)   . Mural thrombus of heart    coumadin  . Nonischemic cardiomyopathy (HCC)    a) 12/17/11 echo: LVEF 25-30%, grade 3 diastolic dysfunction (c/w restriction), mod MR, mod LA/LV and mild RA dilatation; b) 12/18/11 cMRI: LVEF 38%, mod LV/mild RV dilatation, global HK, mild-mod RV sys dysfxn, no LV thrombus & patchy non-subendocardial delayed enhancement c/w infil dz or prior myocarditis   . Paroxysmal SVT (supraventricular tachycardia) (HCC)   . Pulmonary embolism (HCC)    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   Assessment: 51 YOM with mural LV thrombus on warfarin at home. Pharmacy consulted to resume warfarin. INR on admission was therapeutic at 2.75   He is s/p successful cardioversion and is also noted on amiodarone and levaquin. INR today is 3.01    Home warfarin dose: 10 mg daily   Goal of Therapy:  INR 2-2.5 Monitor platelets by anticoagulation protocol: Yes   Plan:  -Warfarin 5mg  tonight -Monitor daily  PT/INR  Harland German, Pharm D 10/18/2016 11:49 AM

## 2016-10-18 NOTE — Progress Notes (Signed)
HR remains on the 70's after Amiodarone drip was discontinued.

## 2016-10-18 NOTE — Progress Notes (Signed)
Patient is OOB sitting at the bedside and conversing with family members. Pt denies pain or discomfort at present time. No signs of SOB or labored breathing observed. Care plan and orders reviewed with patient.

## 2016-10-18 NOTE — Progress Notes (Signed)
Advanced Heart Failure Rounding Note  Primary Cardiologist: Dr. Gala Romney   Subjective:    Admitted from Med center 10/14/16 with marked volume overload and cellulitis. (Had recently been started on ABX).   CXR 10/14/16 with mild CHF and no alveolar PNA.   Underwent successful DC-CV on 10/5. Maintaining NSR. Continues with IV lasix. Given a dose of metolazone yesterday.  Weight down 4 more pounds. Feels much better. SBP increasing slowly. Creatinine 1.00-> 1.25. K 3.3  INR 3.01  ECHO EF 15%.  Objective:   Weight Range: 132.9 kg (293 lb) Body mass index is 44.55 kg/m.   Vital Signs:   Temp:  [98 F (36.7 C)-99.9 F (37.7 C)] 98.6 F (37 C) (10/06 0521) Pulse Rate:  [73-84] 73 (10/06 0521) Resp:  [16-18] 18 (10/06 0521) BP: (87-100)/(49-68) 100/63 (10/06 0521) SpO2:  [90 %-95 %] 92 % (10/06 0521) Weight:  [132.9 kg (293 lb)] 132.9 kg (293 lb) (10/06 0521) Last BM Date: 10/16/16  Weight change: Filed Weights   10/16/16 0452 10/17/16 0417 10/18/16 0521  Weight: 134.6 kg (296 lb 12.8 oz) 134.7 kg (297 lb) 132.9 kg (293 lb)    Intake/Output:   Intake/Output Summary (Last 24 hours) at 10/18/16 1036 Last data filed at 10/18/16 1011  Gross per 24 hour  Intake             3400 ml  Output             2860 ml  Net              540 ml      Physical Exam   General:  Sitting up in chair  No resp difficulty HEENT: normal Neck: supple. nJVP 10. Carotids 2+ bilat; no bruits. No lymphadenopathy or thryomegaly appreciated. Cor: PMI laterally displaced. Regular rate & rhythm. No rubs, gallops or murmurs. Lungs: clear Abdomen: obese soft, nontender, nondistended. No hepatosplenomegaly. No bruits or masses. Good bowel sounds. Extremities: no cyanosis, clubbing, rash, 1+ edema Neuro: alert & orientedx3, cranial nerves grossly intact. moves all 4 extremities w/o difficulty. Affect pleasant   Telemetry   NSR 70-80s with occasional PVCs Personally reviewed   EKG    NSR  with PVCs at 90 bpm on admit.   Labs    CBC  Recent Labs  10/17/16 0538 10/18/16 0434  WBC 11.3* 14.7*  HGB 13.2 13.3  HCT 41.8 41.5  MCV 89.5 88.5  PLT 211 234   Basic Metabolic Panel  Recent Labs  10/17/16 0538 10/18/16 0434  NA 134* 134*  K 3.4* 3.3*  CL 100* 93*  CO2 25 29  GLUCOSE 117* 156*  BUN 14 13  CREATININE 1.00 1.25*  CALCIUM 7.8* 8.3*  MG 1.8  --    Liver Function Tests No results for input(s): AST, ALT, ALKPHOS, BILITOT, PROT, ALBUMIN in the last 72 hours. No results for input(s): LIPASE, AMYLASE in the last 72 hours. Cardiac Enzymes No results for input(s): CKTOTAL, CKMB, CKMBINDEX, TROPONINI in the last 72 hours.  BNP: BNP (last 3 results)  Recent Labs  10/14/16 0740  BNP 210.2*    ProBNP (last 3 results) No results for input(s): PROBNP in the last 8760 hours.   D-Dimer No results for input(s): DDIMER in the last 72 hours. Hemoglobin A1C No results for input(s): HGBA1C in the last 72 hours. Fasting Lipid Panel No results for input(s): CHOL, HDL, LDLCALC, TRIG, CHOLHDL, LDLDIRECT in the last 72 hours. Thyroid Function Tests No results for input(s):  TSH, T4TOTAL, T3FREE, THYROIDAB in the last 72 hours.  Invalid input(s): FREET3  Other results:   Imaging    No results found.   Medications:     Scheduled Medications: . digoxin  0.125 mg Oral Daily  . furosemide  80 mg Intravenous Q12H  . mouth rinse  15 mL Mouth Rinse BID  . metolazone  5 mg Oral Daily  . sodium chloride flush  3 mL Intravenous Q12H  . sodium chloride flush  3 mL Intravenous Q12H  . spironolactone  12.5 mg Oral Daily  . Warfarin - Pharmacist Dosing Inpatient   Does not apply q1800    Infusions: . sodium chloride    . sodium chloride    . amiodarone 60 mg/hr (10/18/16 0801)  . levofloxacin (LEVAQUIN) IV 750 mg (10/18/16 1011)  . vancomycin 1,500 mg (10/18/16 0616)    PRN Medications: sodium chloride, acetaminophen, guaiFENesin, ondansetron  (ZOFRAN) IV, sodium chloride flush, sodium chloride flush    Patient Profile   Roberto [name] is a 51 year old with a history of DVT, mural thrombus, NICM, paroxysmal SVT, PE, and morbid obesity admitted with A/C systolic heart failure.   Assessment/Plan   1. Acute on chronic systolic CHF: NICM - Echo 2016 LVEF 40-45%. ECHO down to 15% likely precipitated by infection/ A Fib RVR.    - HF improving with restoration of NSR and diuresis  - Continue IV lasix and metolazone  - Hold BB and losartan with acute decompensation and low BP  - Hopefully EF wil improve with restoration of NSR.  2. Morbid obesity - Body mass index is 44.55 kg/m.  3. H/o DVT/PE - On coumadin. INR 3.0 Discussed dosing with PharmD 4. Cellulitis/bronchitis - Improving  - Treated with vanc and levaquin.   5. A fib RVR/atrial flutter  - s/p DC-CV 10/5 Will switch IV amio to po  - INR 3.0 6. Hypokalemia - K 3.3 will supp. Continue spiro  Hopefully we can get him home on Monday,   Length of Stay: 4  Jaspreet Bodner, Reuel Boom, MD  10/18/2016, 10:36 AM  Advanced Heart Failure Team Pager [phone] (M-F; 7a - 4p)  Please contact [facility] for night-coverage after hours (4p -7a ) and weekends on [email or web address]

## 2016-10-18 NOTE — Progress Notes (Signed)
CARDIAC REHAB PHASE I   PRE:  Rate/Rhythm: 78   BP:  Sitting: 86/45, standing 92/52       SaO2: 90-92 RA  MODE:  Ambulation: 100 ft   POST:  Rate/Rhythm: 102 st  BP:  Sitting: 95/65     SaO2: 86 Ra with ambulation, increased to 91 on 2L once   back to room. 1500-1520 Patient ambulated in hallway x 1 assist. Slow mildly unsteady gait noted. Patient denied complaints however he has shallow, labored breathing. RA sats dropped to 86%. Notified primary RN that patient would need O2 with next ambulation. Patient also coughed more with exertion. Appears extremely weak. Daughter at bedside. She will return to college tomorrow. Concerned that patient lives alone and appears "unwell". Encouraged patient to ask family or friend to stay with him if he feels unsafe staying alone. Discussed low sodium diet and fluid restrictions along with daily weights with patient. He states he is aware of all his restrictions and was doing well until this acute episode. Will continue to educate and give educational materials upon discharge.   Janashia Parco English PayneRN, BSN 10/18/2016 3:33 PM

## 2016-10-18 NOTE — Plan of Care (Signed)
Problem: Fluid Volume: Goal: Ability to maintain a balanced intake and output will improve Outcome: Not Progressing Verbalized understanding  Problem: Activity: Goal: Capacity to carry out activities will improve Outcome: Progressing OOB ambulating to BR.

## 2016-10-19 LAB — BASIC METABOLIC PANEL
Anion gap: 13 (ref 5–15)
BUN: 14 mg/dL (ref 6–20)
CO2: 32 mmol/L (ref 22–32)
CREATININE: 1.23 mg/dL (ref 0.61–1.24)
Calcium: 8.5 mg/dL — ABNORMAL LOW (ref 8.9–10.3)
Chloride: 88 mmol/L — ABNORMAL LOW (ref 101–111)
GFR calc Af Amer: >60 mL/min (ref >60)
Glucose, Bld: 134 mg/dL — ABNORMAL HIGH (ref 65–99)
Potassium: 3 mmol/L — ABNORMAL LOW (ref 3.5–5.1)
SODIUM: 133 mmol/L — AB (ref 135–145)

## 2016-10-19 LAB — CBC
HCT: 41.9 % (ref 39.0–52.0)
Hemoglobin: 13.8 g/dL (ref 13.0–17.0)
MCH: 28.9 pg (ref 26.0–34.0)
MCHC: 32.9 g/dL (ref 30.0–36.0)
MCV: 87.7 fL (ref 78.0–100.0)
PLATELETS: 241 10*3/uL (ref 150–400)
RBC: 4.78 MIL/uL (ref 4.22–5.81)
RDW: 13.3 % (ref 11.5–15.5)
WBC: 13.6 10*3/uL — AB (ref 4.0–10.5)

## 2016-10-19 LAB — PROTIME-INR
INR: 2.74
Prothrombin Time: 28.8 seconds — ABNORMAL HIGH (ref 11.4–15.2)

## 2016-10-19 MED ORDER — POTASSIUM CHLORIDE CRYS ER 20 MEQ PO TBCR
40.0000 meq | EXTENDED_RELEASE_TABLET | Freq: Once | ORAL | Status: AC
  Administered 2016-10-19: 40 meq via ORAL
  Filled 2016-10-19: qty 2

## 2016-10-19 MED ORDER — WARFARIN SODIUM 5 MG PO TABS
5.0000 mg | ORAL_TABLET | Freq: Once | ORAL | Status: AC
  Administered 2016-10-19: 5 mg via ORAL
  Filled 2016-10-19: qty 1

## 2016-10-19 NOTE — Progress Notes (Signed)
Advanced Heart Failure Rounding Note  Primary Cardiologist: Dr. Gala Romney   Subjective:    Admitted from Med center 10/14/16 with marked volume overload and cellulitis. (Had recently been started on ABX).   CXR 10/14/16 with mild CHF and no alveolar PNA.   Underwent successful DC-CV on 10/5. Maintaining NSR. Continues with IV lasix and metolazone. Weight down 7 more pounds. (11 pounds total)  Says he feels better. Breathing better. However walked with CR yesterday and felt a little weak. SBP in 90s. Creatinine stable 1.2  K 3.0  INR 2.7  ECHO (10/15/16)  EF = 15%.  (down from 40-45%)  - suspect related to AF.   Objective:   Weight Range: 130 kg (286 lb 9.6 oz) Body mass index is 43.58 kg/m.   Vital Signs:   Temp:  [97.5 F (36.4 C)-99 F (37.2 C)] 97.5 F (36.4 C) (10/07 0546) Pulse Rate:  [70-75] 70 (10/07 0546) Resp:  [18] 18 (10/07 0546) BP: (90-91)/(52-56) 91/56 (10/07 0546) SpO2:  [95 %-96 %] 96 % (10/07 0546) Weight:  [130 kg (286 lb 9.6 oz)] 130 kg (286 lb 9.6 oz) (10/07 0546) Last BM Date: 10/17/16  Weight change: Filed Weights   10/17/16 0417 10/18/16 0521 10/19/16 0546  Weight: 134.7 kg (297 lb) 132.9 kg (293 lb) 130 kg (286 lb 9.6 oz)    Intake/Output:   Intake/Output Summary (Last 24 hours) at 10/19/16 1206 Last data filed at 10/19/16 1103  Gross per 24 hour  Intake              633 ml  Output             6225 ml  Net            -5592 ml      Physical Exam   General:  Sitting up in chair  No resp difficulty HEENT: normal. Anicteric Neck: supple. JVP hard to see but looks ~10 Carotids 2+ bilat; no bruits. No lymphadenopathy or thryomegaly appreciated. Cor: PMI laterally displaced. Regular rate & rhythm. No obvious s3.  Lungs: clear no wheeze Abdomen: obese soft NT/ND nondistended. Good bowel sounds Extremities: no cyanosis, clubbing, rash, 1+ edema  + TED hose Neuro: alert & oriented x 3, cranial nerves grossly intact. moves all 4 extremities  w/o difficulty. Affect pleasant   Telemetry   NSR 70s with occasional PVCs Personally reviewed    EKG    NSR with PVCs at 90 bpm on admit 10/2  AFlutter with RVR on 10/2   Labs    CBC  Recent Labs  10/18/16 0434 10/19/16 0534  WBC 14.7* 13.6*  HGB 13.3 13.8  HCT 41.5 41.9  MCV 88.5 87.7  PLT 234 241   Basic Metabolic Panel  Recent Labs  10/17/16 0538 10/18/16 0434 10/19/16 0534  NA 134* 134* 133*  K 3.4* 3.3* 3.0*  CL 100* 93* 88*  CO2 25 29 32  GLUCOSE 117* 156* 134*  BUN CREATININE 1.00 1.25* 1.23  CALCIUM 7.8* 8.3* 8.5*  MG 1.8  --   --    Liver Function Tests No results for input(s): AST, ALT, ALKPHOS, BILITOT, PROT, ALBUMIN in the last 72 hours. No results for input(s): LIPASE, AMYLASE in the last 72 hours. Cardiac Enzymes No results for input(s): CKTOTAL, CKMB, CKMBINDEX, TROPONINI in the last 72 hours.  BNP: BNP (last 3 results)  Recent Labs  10/14/16 0740  BNP 210.2*    ProBNP (last 3 results)  No results for input(s): PROBNP in the last 8760 hours.   D-Dimer No results for input(s): DDIMER in the last 72 hours. Hemoglobin A1C No results for input(s): HGBA1C in the last 72 hours. Fasting Lipid Panel No results for input(s): CHOL, HDL, LDLCALC, TRIG, CHOLHDL, LDLDIRECT in the last 72 hours. Thyroid Function Tests No results for input(s): TSH, T4TOTAL, T3FREE, THYROIDAB in the last 72 hours.  Invalid input(s): FREET3  Other results:   Imaging    No results found.   Medications:     Scheduled Medications: . amiodarone  400 mg Oral BID  . digoxin  0.125 mg Oral Daily  . furosemide  80 mg Intravenous Q12H  . mouth rinse  15 mL Mouth Rinse BID  . metolazone  2.5 mg Oral Daily  . potassium chloride  20 mEq Oral BID  . sodium chloride flush  3 mL Intravenous Q12H  . sodium chloride flush  3 mL Intravenous Q12H  . spironolactone  12.5 mg Oral Daily  . warfarin  5 mg Oral ONCE-1800  . Warfarin - Pharmacist  Dosing Inpatient   Does not apply q1800    Infusions: . sodium chloride    . sodium chloride    . levofloxacin (LEVAQUIN) IV 750 mg (10/19/16 1103)  . vancomycin 1,500 mg (10/19/16 0418)    PRN Medications: sodium chloride, acetaminophen, guaiFENesin, ondansetron (ZOFRAN) IV, sodium chloride flush, sodium chloride flush    Patient Profile   Mr Hillson is a 51 year old with a history of DVT, mural thrombus, NICM, paroxysmal SVT, PE, and morbid obesity admitted with A/C systolic heart failure.   Assessment/Plan   1. Acute on chronic systolic CHF: NICM - Echo 2016 LVEF 40-45%. ECHO down to 15% likely precipitated by infection/ A Fib RVR.    - Remains tenuous but HF improving with restoration of NSR and diuresis down another 7 pounds overnight - BP soft but still appears volume overloaded. Continue IV lasix and metolazone as BP tolerates.  - Continue to hold BB and losartan with acute decompensation and low BP  - RHC tomorrow - Hopefully EF wil improve with restoration of NSR.  2. Morbid obesity - Body mass index is 43.58 kg/m.  3. H/o DVT/PE - On coumadin. INR 2.8 Discussed dosing with PharmD 4. Cellulitis/bronchitis - Improving  - Treated with vanc and levaquin.  Suspect we can stp vancomycin  5. A fib RVR/atrial flutter  - s/p DC-CV 10/5   - maintaining NSR amio switched to po on 10/6 - INR 3.0 6. Hypokalemia - K 3.0 will supp. Continue spiro  Continue diuresis overnight as BP tolerates. RHC tomorrow  to assess volume status and cardiac output.   Length of Stay: 5  Arvilla Meres, MD  10/19/2016, 12:06 PM  Advanced Heart Failure Team Pager [phone] (M-F; 7a - 4p)  Please contact [facility] for night-coverage after hours (4p -7a ) and weekends on [email or web address]

## 2016-10-19 NOTE — Progress Notes (Signed)
ANTICOAGULATION CONSULT NOTE - Follow Up Consult  Pharmacy Consult for Warfarin  Indication: LV thrombus  Allergies  Allergen Reactions  . Shrimp [Shellfish Allergy] Hives and Itching    Patient Measurements: Height: 5\' 8"  (172.7 cm) Weight: 286 lb 9.6 oz (130 kg) IBW/kg (Calculated) : 68.4  Vital Signs: Temp: 97.5 F (36.4 C) (10/07 0546) Temp Source: Oral (10/07 0546) BP: 91/56 (10/07 0546) Pulse Rate: 70 (10/07 0546)  Labs:  Recent Labs  10/17/16 0538 10/18/16 0434 10/19/16 0534  HGB 13.2 13.3 13.8  HCT 41.8 41.5 41.9  PLT 211 234 241  LABPROT 32.0* 31.0* 28.8*  INR 3.13 3.01 2.74  CREATININE 1.00 1.25* 1.23    Estimated Creatinine Clearance: 93.5 mL/min (by C-G formula based on SCr of 1.23 mg/dL).  Medical History: Past Medical History:  Diagnosis Date  . Arthritis   . Chronic combined systolic and diastolic CHF (congestive heart failure) (HCC)   . Deep vein thrombosis (HCC)   . Lupus anticoagulant positive   . Morbid obesity (HCC)   . Mural thrombus of heart    coumadin  . Nonischemic cardiomyopathy (HCC)    a) 12/17/11 echo: LVEF 25-30%, grade 3 diastolic dysfunction (c/w restriction), mod MR, mod LA/LV and mild RA dilatation; b) 12/18/11 cMRI: LVEF 38%, mod LV/mild RV dilatation, global HK, mild-mod RV sys dysfxn, no LV thrombus & patchy non-subendocardial delayed enhancement c/w infil dz or prior myocarditis   . Paroxysmal SVT (supraventricular tachycardia) (HCC)   . Pulmonary embolism (HCC)    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   Assessment: 51 YOM with mural LV thrombus on warfarin at home. Pharmacy consulted to resume warfarin. INR on admission was therapeutic at 2.75   He is s/p successful cardioversion and is also noted on amiodarone and levaquin. INR today is 2.74    Home warfarin dose: 10 mg daily   Goal of Therapy:  INR 2-2.5 Monitor platelets by anticoagulation protocol: Yes   Plan:  -Warfarin 5mg  tonight -Monitor daily  PT/INR  Harland German, Pharm D 10/19/2016 11:00 AM

## 2016-10-20 ENCOUNTER — Encounter (HOSPITAL_COMMUNITY)
Admission: EM | Disposition: A | Discharge status: Home / Self Care | Payer: Self-pay | Source: Home / Self Care | Attending: Cardiology

## 2016-10-20 HISTORY — PX: RIGHT HEART CATH: CATH118263

## 2016-10-20 LAB — BASIC METABOLIC PANEL
ANION GAP: 13 (ref 5–15)
BUN: 21 mg/dL — ABNORMAL HIGH (ref 6–20)
CALCIUM: 8.8 mg/dL — AB (ref 8.9–10.3)
CO2: 33 mmol/L — AB (ref 22–32)
CREATININE: 1.22 mg/dL (ref 0.61–1.24)
Chloride: 88 mmol/L — ABNORMAL LOW (ref 101–111)
GFR calc Af Amer: >60 mL/min (ref >60)
Glucose, Bld: 110 mg/dL — ABNORMAL HIGH (ref 65–99)
Potassium: 3.3 mmol/L — ABNORMAL LOW (ref 3.5–5.1)
SODIUM: 134 mmol/L — AB (ref 135–145)

## 2016-10-20 LAB — POCT I-STAT 3, ART BLOOD GAS (G3+)
ACID-BASE EXCESS: 7 mmol/L — AB (ref 0.0–2.0)
Acid-Base Excess: 10 mmol/L — ABNORMAL HIGH (ref 0.0–2.0)
BICARBONATE: 31.1 mmol/L — AB (ref 20.0–28.0)
BICARBONATE: 35.7 mmol/L — AB (ref 20.0–28.0)
O2 SAT: 61 %
O2 SAT: 64 %
PCO2 ART: 43.3 mmHg (ref 32.0–48.0)
PCO2 ART: 48.1 mmHg — AB (ref 32.0–48.0)
PO2 ART: 30 mmHg — AB (ref 83.0–108.0)
PO2 ART: 31 mmHg — AB (ref 83.0–108.0)
TCO2: 32 mmol/L (ref 22–32)
TCO2: 37 mmol/L — AB (ref 22–32)
pH, Arterial: 7.465 — ABNORMAL HIGH (ref 7.350–7.450)
pH, Arterial: 7.478 — ABNORMAL HIGH (ref 7.350–7.450)

## 2016-10-20 LAB — CBC
HCT: 43.5 % (ref 39.0–52.0)
HEMOGLOBIN: 14.4 g/dL (ref 13.0–17.0)
MCH: 28.7 pg (ref 26.0–34.0)
MCHC: 33.1 g/dL (ref 30.0–36.0)
MCV: 86.7 fL (ref 78.0–100.0)
PLATELETS: 233 10*3/uL (ref 150–400)
RBC: 5.02 MIL/uL (ref 4.22–5.81)
RDW: 13.1 % (ref 11.5–15.5)
WBC: 13.7 10*3/uL — AB (ref 4.0–10.5)

## 2016-10-20 LAB — CULTURE, BLOOD (ROUTINE X 2)
CULTURE: NO GROWTH
CULTURE: NO GROWTH
Special Requests: ADEQUATE
Special Requests: ADEQUATE

## 2016-10-20 LAB — PROTIME-INR
INR: 2.48
PROTHROMBIN TIME: 26.7 s — AB (ref 11.4–15.2)

## 2016-10-20 LAB — MAGNESIUM: Magnesium: 1.7 mg/dL (ref 1.7–2.4)

## 2016-10-20 LAB — VANCOMYCIN, TROUGH: VANCOMYCIN TR: 23 ug/mL — AB (ref 15–20)

## 2016-10-20 SURGERY — RIGHT HEART CATH
Anesthesia: LOCAL

## 2016-10-20 MED ORDER — ASPIRIN 81 MG PO CHEW
81.0000 mg | CHEWABLE_TABLET | ORAL | Status: AC
  Administered 2016-10-20: 81 mg via ORAL
  Filled 2016-10-20: qty 1

## 2016-10-20 MED ORDER — SODIUM CHLORIDE 0.9 % IV SOLN: INTRAVENOUS | Status: DC

## 2016-10-20 MED ORDER — POTASSIUM CHLORIDE CRYS ER 20 MEQ PO TBCR
40.0000 meq | EXTENDED_RELEASE_TABLET | Freq: Once | ORAL | Status: AC
  Administered 2016-10-20: 40 meq via ORAL
  Filled 2016-10-20: qty 2

## 2016-10-20 MED ORDER — SODIUM CHLORIDE 0.9 % IV SOLN: 250.0000 mL | INTRAVENOUS | Status: DC | PRN

## 2016-10-20 MED ORDER — SODIUM CHLORIDE 0.9% FLUSH
3.0000 mL | Freq: Two times a day (BID) | INTRAVENOUS | Status: DC
  Administered 2016-10-20: 3 mL via INTRAVENOUS

## 2016-10-20 MED ORDER — MAGNESIUM SULFATE 4 GM/100ML IV SOLN
4.0000 g | Freq: Once | INTRAVENOUS | Status: AC
  Administered 2016-10-20: 4 g via INTRAVENOUS
  Filled 2016-10-20: qty 100

## 2016-10-20 MED ORDER — SODIUM CHLORIDE 0.9% FLUSH
3.0000 mL | Freq: Two times a day (BID) | INTRAVENOUS | Status: DC
  Administered 2016-10-21: 3 mL via INTRAVENOUS

## 2016-10-20 MED ORDER — LIDOCAINE HCL (PF) 1 % IJ SOLN
INTRAMUSCULAR | Status: DC | PRN
  Administered 2016-10-20: 2 mL

## 2016-10-20 MED ORDER — FUROSEMIDE 80 MG PO TABS
80.0000 mg | ORAL_TABLET | Freq: Two times a day (BID) | ORAL | Status: DC
  Administered 2016-10-20 – 2016-10-21 (×2): 80 mg via ORAL
  Filled 2016-10-20 (×2): qty 1

## 2016-10-20 MED ORDER — HEPARIN (PORCINE) IN NACL 2-0.9 UNIT/ML-% IJ SOLN
INTRAMUSCULAR | Status: AC
  Filled 2016-10-20: qty 500

## 2016-10-20 MED ORDER — SODIUM CHLORIDE 0.9% FLUSH: 3.0000 mL | INTRAVENOUS | Status: DC | PRN

## 2016-10-20 MED ORDER — WARFARIN SODIUM 10 MG PO TABS
10.0000 mg | ORAL_TABLET | Freq: Once | ORAL | Status: AC
  Administered 2016-10-20: 10 mg via ORAL
  Filled 2016-10-20: qty 1

## 2016-10-20 MED ORDER — LIDOCAINE HCL 2 % IJ SOLN
INTRAMUSCULAR | Status: AC
  Filled 2016-10-20: qty 10

## 2016-10-20 MED ORDER — SODIUM CHLORIDE 0.9 % IV SOLN
INTRAVENOUS | Status: DC
  Administered 2016-10-20: 10 mL/h via INTRAVENOUS

## 2016-10-20 MED ORDER — VANCOMYCIN HCL IN DEXTROSE 1-5 GM/200ML-% IV SOLN
1000.0000 mg | Freq: Two times a day (BID) | INTRAVENOUS | Status: DC
  Administered 2016-10-20: 1000 mg via INTRAVENOUS
  Filled 2016-10-20: qty 200

## 2016-10-20 MED ORDER — LEVOFLOXACIN 750 MG PO TABS
750.0000 mg | ORAL_TABLET | Freq: Every day | ORAL | Status: DC
  Administered 2016-10-20: 750 mg via ORAL
  Filled 2016-10-20: qty 1

## 2016-10-20 MED ORDER — HEPARIN (PORCINE) IN NACL 2-0.9 UNIT/ML-% IJ SOLN
INTRAMUSCULAR | Status: AC | PRN
  Administered 2016-10-20: 500 mL

## 2016-10-20 MED ORDER — ONDANSETRON HCL 4 MG/2ML IJ SOLN: 4.0000 mg | Freq: Four times a day (QID) | INTRAMUSCULAR | Status: DC | PRN

## 2016-10-20 MED ORDER — ACETAMINOPHEN 325 MG PO TABS
650.0000 mg | ORAL_TABLET | ORAL | Status: DC | PRN
  Administered 2016-10-20: 650 mg via ORAL
  Filled 2016-10-20: qty 2

## 2016-10-20 SURGICAL SUPPLY — 5 items
CATH BALLN WEDGE 5F 110CM (CATHETERS) ×1 IMPLANT
PACK CARDIAC CATHETERIZATION (CUSTOM PROCEDURE TRAY) ×2 IMPLANT
SHEATH GLIDE SLENDER 4/5FR (SHEATH) ×2 IMPLANT
TRANSDUCER W/STOPCOCK (MISCELLANEOUS) ×2 IMPLANT
WIRE EMERALD 3MM-J .025X260CM (WIRE) ×1 IMPLANT

## 2016-10-20 NOTE — Progress Notes (Signed)
Pharmacy Antibiotic Note  Roberto [name] is a 51 y.o. male admitted on 10/14/2016 with cellulitis.  Pharmacy has been consulted for vanomcyin dosing.  Vanc trough above goal.  Plan: Change vancomycin to 1000mg  IV every 12 hours for calculated trough ~12.  Goal trough 10-15 mcg/mL.  Height: 5\' 8"  (172.7 cm) Weight: 281 lb (127.5 kg) (scale c) IBW/kg (Calculated) : 68.4  Temp (24hrs), Avg:98.1 F (36.7 C), Min:97.5 F (36.4 C), Max:98.9 F (37.2 C)   Recent Labs Lab 10/16/16 0840 10/17/16 0538 10/17/16 1152 10/18/16 0434 10/19/16 0534 10/20/16 0315  WBC 12.3* 11.3*  --  14.7* 13.6* 13.7*  CREATININE 0.97 1.00  --  1.25* 1.23 1.22  LATICACIDVEN 1.3  --   --   --   --   --   VANCOTROUGH  --   --  21*  --   --  23*    Estimated Creatinine Clearance: 93.2 mL/min (by C-G formula based on SCr of 1.22 mg/dL).    Allergies  Allergen Reactions  . Shrimp [Shellfish Allergy] Hives and Itching    Antimicrobials this admission: vanc 10/3 >>  Levaquin 10/3 >> 10/8  Dose adjustments this admission: vanc 1250 Q8 > trough 21 > vanc 1500 Q12 > trough 23 > vanc 1000 Q12   Thank you for allowing pharmacy to be a part of this patient's care.  Vernard Gambles, PharmD, BCPS  10/20/2016 4:49 AM

## 2016-10-20 NOTE — Progress Notes (Signed)
Received post Cath, R brachial dressing in placed,site clean dry intact , no hematoma no bruising noted.,will monitor.

## 2016-10-20 NOTE — Progress Notes (Signed)
CARDIAC REHAB PHASE I   PRE:  Rate/Rhythm: 68 SR    BP: sitting 87/51    SaO2: 97 RA  MODE:  Ambulation: 400 ft   POST:  Rate/Rhythm: 92 SR    BP: sitting 102/50     SaO2: 97 RA  Pt c/o feet hurting on tops and bottoms. Willing to walk. Used RW due to foot pain. He leaned over RW initially however with distance he was able to stand more upright. He was able to take a much longer walk, no SOB or hypoxia. BP increased, no Afib. He will need to walk without RW tonight or tomorrow to eval if he needs one for home. Reviewed HF book with him. Reinforced sodium restrictions, daily wts, ex, fluid restrictions. He is interested in [facility] and will refer to [place] [facility]. He has lost 60 lbs recently and has been exercising x4 week before he recently decompensated. He is eager to return to exercise.  [id number]  Harriet Masson CES, ACSM 10/20/2016 9:40 AM

## 2016-10-20 NOTE — Progress Notes (Signed)
ANTICOAGULATION CONSULT NOTE - Follow Up Consult  Pharmacy Consult for Warfarin  Indication: LV thrombus  Allergies  Allergen Reactions  . Shrimp [Shellfish Allergy] Hives and Itching    Patient Measurements: Height: 5\' 8"  (172.7 cm) Weight: 281 lb (127.5 kg) (scale c) IBW/kg (Calculated) : 68.4  Vital Signs: Temp: 98.8 F (37.1 C) (10/08 1154) Temp Source: Oral (10/08 1154) BP: 92/55 (10/08 1154) Pulse Rate: 72 (10/08 1154)  Labs:  Recent Labs  10/18/16 0434 10/19/16 0534 10/20/16 0315  HGB 13.3 13.8 14.4  HCT 41.5 41.9 43.5  PLT 234 241 233  LABPROT 31.0* 28.8* 26.7*  INR 3.01 2.74 2.48  CREATININE 1.25* 1.23 1.22    Estimated Creatinine Clearance: 93.2 mL/min (by C-G formula based on SCr of 1.22 mg/dL).  Medical History: Past Medical History:  Diagnosis Date  . Arthritis   . Chronic combined systolic and diastolic CHF (congestive heart failure) (HCC)   . Deep vein thrombosis (HCC)   . Lupus anticoagulant positive   . Morbid obesity (HCC)   . Mural thrombus of heart    coumadin  . Nonischemic cardiomyopathy (HCC)    a) 12/17/11 echo: LVEF 25-30%, grade 3 diastolic dysfunction (c/w restriction), mod MR, mod LA/LV and mild RA dilatation; b) 12/18/11 cMRI: LVEF 38%, mod LV/mild RV dilatation, global HK, mild-mod RV sys dysfxn, no LV thrombus & patchy non-subendocardial delayed enhancement c/w infil dz or prior myocarditis   . Paroxysmal SVT (supraventricular tachycardia) (HCC)   . Pulmonary embolism (HCC)    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   Assessment: 51 YOM with mural LV thrombus on warfarin at home. Pharmacy consulted to resume warfarin. INR on admission was therapeutic at 2.75   He is s/p successful cardioversion 10/5 and is also noted on amiodarone and levaquin. INR today is 2.48, no overt bleeding or complications noted.  Antibiotics stopped today.  Home warfarin dose: 10 mg daily   May need reduced home dose, but expect INR to continue to fall  since he's had doses significantly less than his home dose for several days.   Goal of Therapy:  INR 2-2.5 Monitor platelets by anticoagulation protocol: Yes   Plan:  -Warfarin 10 mg x 1 tonight. -Daily PT/INR.  Tad Moore, BCPS  Clinical Pharmacist Pager [phone]  10/20/2016 1:19 PM

## 2016-10-20 NOTE — Progress Notes (Signed)
Advanced Heart Failure Rounding Note  Primary Cardiologist: Dr. Gala Romney   Subjective:    Admitted from Med center 10/14/16 with marked volume overload and cellulitis. (Had recently been started on ABX).   CXR 10/14/16 with mild CHF and no alveolar PNA.   Underwent successful DC-CV on 10/5. Maintaining NSR. Continues with IV lasix and metolazone.  Feeling better. Just walked with CR today and has improved from yesterday.   Weight down another 5 lbs. (16 total).   Creatinine stable at 1.22. K 3.3. INR 2.48.  ECHO (10/15/16)  EF = 15%.  (down from 40-45%)  - suspect related to AF.   Objective:   Weight Range: 281 lb (127.5 kg) Body mass index is 42.73 kg/m.   Vital Signs:   Temp:  [98 F (36.7 C)-98.9 F (37.2 C)] 98.9 F (37.2 C) (10/08 0433) Pulse Rate:  [72-78] 72 (10/08 0433) Resp:  [18-20] 18 (10/08 0433) BP: (85-90)/(51-56) 85/52 (10/08 0433) SpO2:  [94 %-96 %] 94 % (10/08 0433) Weight:  [281 lb (127.5 kg)] 281 lb (127.5 kg) (10/08 0433) Last BM Date: 10/17/16  Weight change: Filed Weights   10/18/16 0521 10/19/16 0546 10/20/16 0433  Weight: 293 lb (132.9 kg) 286 lb 9.6 oz (130 kg) 281 lb (127.5 kg)    Intake/Output:   Intake/Output Summary (Last 24 hours) at 10/20/16 0959 Last data filed at 10/20/16 0500  Gross per 24 hour  Intake             2490 ml  Output             2470 ml  Net               20 ml      Physical Exam   General: Well appearing. No resp difficulty. HEENT: Normal Neck: Supple. JVP hard to see but looks OK this am. Carotids 2+ bilat; no bruits. No thyromegaly or nodule noted. Cor: PMI nondisplaced. RRR, No M/G/R noted Lungs: CTAB, normal effort. Abdomen: Soft, non-tender, non-distended, no HSM. No bruits or masses. +BS  Extremities: No cyanosis, clubbing, or rash. Trace edema. + ted hose.  Neuro: Alert & orientedx3, cranial nerves grossly intact. moves all 4 extremities w/o difficulty. Affect pleasant    Telemetry   NSR  70s with occasional PVCs, Personally reviewed.   EKG    NSR with PVCs at 90 bpm on admit 10/2  AFlutter with RVR on 10/2   Labs    CBC  Recent Labs  10/19/16 0534 10/20/16 0315  WBC 13.6* 13.7*  HGB 13.8 14.4  HCT 41.9 43.5  MCV 87.7 86.7  PLT 241 233   Basic Metabolic Panel  Recent Labs  10/19/16 0534 10/20/16 0315  NA 133* 134*  K 3.0* 3.3*  CL 88* 88*  CO2 32 33*  GLUCOSE 134* 110*  BUN 14 21*  CREATININE 1.23 1.22  CALCIUM 8.5* 8.8*  MG  --  1.7   Liver Function Tests No results for input(s): AST, ALT, ALKPHOS, BILITOT, PROT, ALBUMIN in the last 72 hours. No results for input(s): LIPASE, AMYLASE in the last 72 hours. Cardiac Enzymes No results for input(s): CKTOTAL, CKMB, CKMBINDEX, TROPONINI in the last 72 hours.  BNP: BNP (last 3 results)  Recent Labs  10/14/16 0740  BNP 210.2*    ProBNP (last 3 results) No results for input(s): PROBNP in the last 8760 hours.   D-Dimer No results for input(s): DDIMER in the last 72 hours. Hemoglobin A1C No results  for input(s): HGBA1C in the last 72 hours. Fasting Lipid Panel No results for input(s): CHOL, HDL, LDLCALC, TRIG, CHOLHDL, LDLDIRECT in the last 72 hours. Thyroid Function Tests No results for input(s): TSH, T4TOTAL, T3FREE, THYROIDAB in the last 72 hours.  Invalid input(s): FREET3  Other results:   Imaging    No results found.   Medications:     Scheduled Medications: . amiodarone  400 mg Oral BID  . digoxin  0.125 mg Oral Daily  . furosemide  80 mg Oral BID  . mouth rinse  15 mL Mouth Rinse BID  . potassium chloride  20 mEq Oral BID  . potassium chloride  40 mEq Oral Once  . sodium chloride flush  3 mL Intravenous Q12H  . sodium chloride flush  3 mL Intravenous Q12H  . sodium chloride flush  3 mL Intravenous Q12H  . spironolactone  12.5 mg Oral Daily  . Warfarin - Pharmacist Dosing Inpatient   Does not apply q1800    Infusions: . sodium chloride    . sodium chloride      . sodium chloride    . sodium chloride    . magnesium sulfate 1 - 4 g bolus IVPB    . vancomycin 1,000 mg (10/20/16 0814)    PRN Medications: sodium chloride, sodium chloride, acetaminophen, guaiFENesin, ondansetron (ZOFRAN) IV, sodium chloride flush, sodium chloride flush, sodium chloride flush    Patient Profile   [name] is a 51 year old with a history of DVT, mural thrombus, NICM, paroxysmal SVT, PE, and morbid obesity admitted with A/C systolic heart failure.   Assessment/Plan   1. Acute on chronic systolic CHF: NICM - Echo 2016 LVEF 40-45%. ECHO down to 15% likely precipitated by infection/ A Fib RVR.    - Remains tenuous but HF improving with restoration of NSR and diuresis down another 5 pounds overnight - BP soft but improved.  - Volume status improved. Transition back to home po lasix at 80 mg BID.  - Continue to hold BB and losartan with acute decompensation and low BP  - RHC today.  - Hopefully EF wil improve with restoration of NSR.  2. Morbid obesity - Body mass index is 42.73 kg/m.  3. H/o DVT/PE - On coumadin. INR 2.48. Discussed with Pharm-D.  4. Cellulitis/bronchitis - Improving.  - Will discuss narrowing ABX with Pharm-D. Will stop vanc and complete 10 day course of Levaquin.  5. A fib RVR/atrial flutter  - s/p DC-CV 10/5   - maintaining NSR amio switched to po on 10/6 - INR 2.48. 6. Hypokalemia - K 3.3. Will supp. Continue spiro.    RHC today. Likely home tomorrow.   Length of Stay: [address]  [employer]ane [employer]  10/20/2016, 9:59 AM  Advanced Heart Failure Team Pager [phone] (M-F; 7a - 4p)  Please contact [facility] for night-coverage after hours (4p -7a ) and weekends on [email or web address]  Patient seen and examined with the above-signed Advanced Practice Provider and/or Housestaff. I personally reviewed laboratory data, imaging studies and relevant notes. I independently examined the patient and formulated the important aspects of the plan. I  have edited the note to reflect any of my changes or salient points. I have personally discussed the plan with the patient and/or family.  Volume status and functional capacity improving with IV diuresis and restoration of NSR. Now on po amio. Plan RHC today. Likely home tomorrow if cath ok. Supp K. Abx per primary team. Can likely stop soon.  Arvilla Meres, MD  11:42 AM

## 2016-10-21 ENCOUNTER — Encounter (HOSPITAL_COMMUNITY): Payer: Self-pay | Admitting: Internal Medicine

## 2016-10-21 DIAGNOSIS — M1000 Idiopathic gout, unspecified site: Secondary | ICD-10-CM

## 2016-10-21 LAB — CBC
HEMATOCRIT: 43.8 % (ref 39.0–52.0)
Hemoglobin: 14.7 g/dL (ref 13.0–17.0)
MCH: 29.1 pg (ref 26.0–34.0)
MCHC: 33.6 g/dL (ref 30.0–36.0)
MCV: 86.7 fL (ref 78.0–100.0)
PLATELETS: 236 10*3/uL (ref 150–400)
RBC: 5.05 MIL/uL (ref 4.22–5.81)
RDW: 13.2 % (ref 11.5–15.5)
WBC: 14.7 10*3/uL — ABNORMAL HIGH (ref 4.0–10.5)

## 2016-10-21 LAB — BASIC METABOLIC PANEL
Anion gap: 15 (ref 5–15)
BUN: 22 mg/dL — AB (ref 6–20)
CHLORIDE: 89 mmol/L — AB (ref 101–111)
CO2: 29 mmol/L (ref 22–32)
CREATININE: 1.21 mg/dL (ref 0.61–1.24)
Calcium: 8.9 mg/dL (ref 8.9–10.3)
GFR calc Af Amer: >60 mL/min (ref >60)
GFR calc non Af Amer: >60 mL/min (ref >60)
GLUCOSE: 108 mg/dL — AB (ref 65–99)
POTASSIUM: 3.3 mmol/L — AB (ref 3.5–5.1)
Sodium: 133 mmol/L — ABNORMAL LOW (ref 135–145)

## 2016-10-21 LAB — PROTIME-INR
INR: 3.01
Prothrombin Time: 31 seconds — ABNORMAL HIGH (ref 11.4–15.2)

## 2016-10-21 MED ORDER — PREDNISONE 20 MG PO TABS
40.0000 mg | ORAL_TABLET | ORAL | Status: AC
  Administered 2016-10-21: 40 mg via ORAL
  Filled 2016-10-21: qty 2

## 2016-10-21 MED ORDER — WARFARIN SODIUM 5 MG PO TABS: 5.0000 mg | ORAL_TABLET | Freq: Every day | ORAL | 1 refills | Status: DC

## 2016-10-21 MED ORDER — AMIODARONE HCL 200 MG PO TABS: ORAL_TABLET | ORAL | 3 refills | Status: DC

## 2016-10-21 MED ORDER — FUROSEMIDE 80 MG PO TABS: 80.0000 mg | ORAL_TABLET | Freq: Two times a day (BID) | ORAL | 5 refills | Status: DC

## 2016-10-21 MED ORDER — COLCHICINE 0.6 MG PO TABS: 0.6000 mg | ORAL_TABLET | Freq: Once | ORAL | Status: DC

## 2016-10-21 MED ORDER — COLCHICINE 0.6 MG PO TABS: 0.6000 mg | ORAL_TABLET | Freq: Every day | ORAL | 0 refills | Status: DC

## 2016-10-21 MED ORDER — WARFARIN SODIUM 2.5 MG PO TABS: 2.5000 mg | ORAL_TABLET | Freq: Once | ORAL | Status: DC

## 2016-10-21 MED ORDER — POTASSIUM CHLORIDE CRYS ER 20 MEQ PO TBCR: 20.0000 meq | EXTENDED_RELEASE_TABLET | Freq: Two times a day (BID) | ORAL | 3 refills | Status: DC

## 2016-10-21 MED ORDER — COLCHICINE 0.6 MG PO TABS: 0.6000 mg | ORAL_TABLET | Freq: Two times a day (BID) | ORAL | Status: DC

## 2016-10-21 MED ORDER — COLCHICINE 0.6 MG PO TABS
0.6000 mg | ORAL_TABLET | Freq: Once | ORAL | Status: AC
  Administered 2016-10-21: 0.6 mg via ORAL
  Filled 2016-10-21: qty 1

## 2016-10-21 MED ORDER — SPIRONOLACTONE 25 MG PO TABS: 12.5000 mg | ORAL_TABLET | Freq: Every day | ORAL | 3 refills | Status: DC

## 2016-10-21 MED ORDER — POTASSIUM CHLORIDE CRYS ER 20 MEQ PO TBCR
40.0000 meq | EXTENDED_RELEASE_TABLET | Freq: Once | ORAL | Status: AC
  Administered 2016-10-21: 40 meq via ORAL
  Filled 2016-10-21: qty 2

## 2016-10-21 MED ORDER — COLCHICINE 0.6 MG PO TABS
1.2000 mg | ORAL_TABLET | Freq: Once | ORAL | Status: AC
  Administered 2016-10-21: 1.2 mg via ORAL
  Filled 2016-10-21: qty 2

## 2016-10-21 MED FILL — Lidocaine HCl Local Inj 2%: INTRAMUSCULAR | Qty: 10 | Status: AC

## 2016-10-21 NOTE — Discharge Summary (Signed)
Advanced Heart Failure Discharge Note  Discharge Summary   Patient ID: Roberto [name] MRN: [id number], DOB/AGE: March 10, 1965 51 y.o. Admit date: 10/14/2016 D/C date:     10/21/2016   Primary Discharge Diagnoses:  1. Acute on chronic systolic CHF: NICM 2. Morbid obesity 3. H/o DVT/PE 4. Cellulitis 5. Acute bronchitis 6. Paroxysmal A fib RVR/atrial flutter 7. Hypokalemia 8. Acute right foot gout  Hospital Course:   Roberto [name] is a 51 year old with a history of DVT, mural thrombus, NICM, paroxysmal SVT, PE, and morbid obesity admitted 10/14/16 with A/C systolic heart failure and cellulitis (had recently been started on ABX).  Echo 10/15/16 showed EF dropped to 15% from 40-45% from 2016.   Pt had fever overnight into 10/3 with bloddy sputum and soft pressures. BCx and UCx negative. Sputum Cx negative as well. Pt was NSR on admit, but went into Afib am of 10/3 likely in setting of infection. Started on IV amiodarone and broad spectrum ABX, which were narrowed to Levaquin for completion for CAP.  Pt diuresed on IV lasix and meds adjusted as tolerated.  Felt to have worsening LV function due to AF and tachy-induce cardiomyopathy. Pt underwent DCCV 10/5 with successful conversion to NSR. No TEE needed as patient had been compliant with coumadin follow up and had no recent sub-therapeutic values. IV amiodarone switched to po on 10/6. Pt remained in NSR for the remained of his admission.   Pt underwent RHC 10/20/16 which showed mild to moderate volume overload with mildly depressed CO.   Hospital course additionally complicated by R foot pain, thought to acute gout flare due to diuresis. Treated with one dose of oral prednisone and short course of colchicine, and pt urged to make PCP follow up. (information given, PCP office was closed at time of discharge).   Overall pt diuresed 11.3 L and down 12 lbs from admission. Pt seen am of 10/21/16 and thought stable for discharge on po lasix.  He will be  discharged to home in stable condition, with close follow up as below.   Discharge Weight Range: 279 lb Discharge Vitals: Blood pressure 90/68, pulse 77, temperature 98.4 F (36.9 C), temperature source Oral, resp. rate 18, height 5\' 8"  (1.727 m), weight 279 lb 6.4 oz (126.7 kg), SpO2 96 %.  Labs: Lab Results  Component Value Date   WBC 14.7 (H) 10/21/2016   HGB 14.7 10/21/2016   HCT 43.8 10/21/2016   MCV 86.7 10/21/2016   PLT 236 10/21/2016     Recent Labs Lab 10/21/16 0502  NA 133*  K 3.3*  CL 89*  CO2 29  BUN 22*  CREATININE 1.21  CALCIUM 8.9  GLUCOSE 108*   Lab Results  Component Value Date   CHOL 107 (L) 09/18/2015   HDL 40 09/18/2015   LDLCALC 53 09/18/2015   TRIG 68 09/18/2015   BNP (last 3 results)  Recent Labs  10/14/16 0740  BNP 210.2*    ProBNP (last 3 results) No results for input(s): PROBNP in the last 8760 hours.   Diagnostic Studies/Procedures   RHC 10/20/16 RA = 3 RV = 45/7 PA = 49/20 (31) PCW = 28 Fick cardiac output/index = 5.0/2.1 PVR = 0.5 WU Ao sat = 95% PA sat = 64%, 61%  Discharge Medications   Allergies as of 10/21/2016      Reactions   Shrimp [shellfish Allergy] Hives, Itching      Medication List    STOP taking these medications   carvedilol  12.5 MG tablet Commonly known as:  COREG   cephALEXin 500 MG capsule Commonly known as:  KEFLEX   losartan 100 MG tablet Commonly known as:  COZAAR     TAKE these medications   amiodarone 200 MG tablet Commonly known as:  PACERONE Take 2 tabs (400 mg total) twice daily until 10/13, then take 1 tab (200 mg) twice daily until 10/20, Then take 1 tab (200 mg) once daily.   colchicine 0.6 MG tablet Take 1 tablet (0.6 mg total) by mouth daily. For one week.   CONTRAVE 8-90 MG Tb12 Generic drug:  Naltrexone-Bupropion HCl ER TAKE 2 TABLETS BY MOUTH TWICE DAILY   digoxin 0.125 MG tablet Commonly known as:  DIGOX TAKE 1 TABLET(0.125 MG) BY MOUTH DAILY   furosemide 80 MG  tablet Commonly known as:  LASIX Take 1 tablet (80 mg total) by mouth 2 (two) times daily.   potassium chloride SA 20 MEQ tablet Commonly known as:  K-DUR,KLOR-CON Take 1 tablet (20 mEq total) by mouth 2 (two) times daily.   spironolactone 25 MG tablet Commonly known as:  ALDACTONE Take 0.5 tablets (12.5 mg total) by mouth daily. What changed:  how much to take   warfarin 5 MG tablet Commonly known as:  COUMADIN Take 1 tablet (5 mg total) by mouth daily at 6 PM. Further per PCP What changed:  medication strength  See the new instructions.            Durable Medical Equipment        Start     Ordered   10/21/16 [phone]  For home use only DME Walker  Once    Question:  Patient needs a walker to treat with the following condition  Answer:  CHF (congestive heart failure) (HCC)   10/21/16 0940      Disposition   The patient will be discharged in stable condition to home.  Discharge Instructions    (HEART FAILURE PATIENTS) Call MD:  Anytime you have any of the following symptoms: 1) 3 pound weight gain in 24 hours or 5 pounds in 1 week 2) shortness of breath, with or without a dry hacking cough 3) swelling in the hands, feet or stomach 4) if you have to sleep on extra pillows at night in order to breathe.    Complete by:  As directed    Amb Referral to Cardiac Rehabilitation    Complete by:  As directed    Diagnosis:  Heart Failure (see criteria below if ordering Phase II)   Heart Failure Type:  Chronic Systolic & Diastolic   Diet - low sodium heart healthy    Complete by:  As directed    Increase activity slowly    Complete by:  As directed    STOP any activity that causes chest pain, shortness of breath, dizziness, sweating, or exessive weakness    Complete by:  As directed      Follow-up Information    Cicily Bonano, Bevelyn Buckles, MD Follow up on 10/31/2016.   Specialty:  Cardiology Why:  at 3 pm for post hospital follow up. Enter thru [address] off of [address].  Underground parking on your right. The code for parking is 8000. Can also park in lower ED lot and enter thru blue awning.  Contact information: [address] [place] [address] [phone]        [name], MD. Schedule an appointment as soon as possible for a visit in 1 week(s).  Specialty:  Family Medicine Contact information: [address] [place] [address] [phone]            Duration of Discharge Encounter: Greater than 35 minutes   Signed, Luane School 10/21/2016, 2:48 PM  Patient seen and examined with the above-signed Advanced Practice Provider and/or Housestaff. I personally reviewed laboratory data, imaging studies and relevant notes. I independently examined the patient and formulated the important aspects of the plan. I have edited the note to reflect any of my changes or salient points. I have personally discussed the plan with the patient and/or family.  He is much improved. Ok for d/c home today with close f/u in HF Clinic.   Arvilla Meres, MD  9:55 PM

## 2016-10-21 NOTE — Progress Notes (Signed)
ANTICOAGULATION CONSULT NOTE - Follow Up Consult  Pharmacy Consult for Warfarin  Indication: LV thrombus  Allergies  Allergen Reactions  . Shrimp [Shellfish Allergy] Hives and Itching    Patient Measurements: Height: 5\' 8"  (172.7 cm) Weight: 279 lb 6.4 oz (126.7 kg) (scale c) IBW/kg (Calculated) : 68.4  Vital Signs: Temp: 98.4 F (36.9 C) (10/09 0359) Temp Source: Oral (10/09 0359) BP: 102/54 (10/09 0840) Pulse Rate: 50 (10/09 0840)  Labs:  Recent Labs  10/19/16 0534 10/20/16 0315 10/21/16 0502  HGB 13.8 14.4 14.7  HCT 41.9 43.5 43.8  PLT 241 233 236  LABPROT 28.8* 26.7* 31.0*  INR 2.74 2.48 3.01  CREATININE 1.23 1.22 1.21    Estimated Creatinine Clearance: 93.7 mL/min (by C-G formula based on SCr of 1.21 mg/dL).  Medical History: Past Medical History:  Diagnosis Date  . Arthritis   . Chronic combined systolic and diastolic CHF (congestive heart failure) (HCC)   . Deep vein thrombosis (HCC)   . Lupus anticoagulant positive   . Morbid obesity (HCC)   . Mural thrombus of heart    coumadin  . Nonischemic cardiomyopathy (HCC)    a) 12/17/11 echo: LVEF 25-30%, grade 3 diastolic dysfunction (c/w restriction), mod MR, mod LA/LV and mild RA dilatation; b) 12/18/11 cMRI: LVEF 38%, mod LV/mild RV dilatation, global HK, mild-mod RV sys dysfxn, no LV thrombus & patchy non-subendocardial delayed enhancement c/w infil dz or prior myocarditis   . Paroxysmal SVT (supraventricular tachycardia) (HCC)   . Pulmonary embolism (HCC)    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   Assessment: Roberto [name] with mural LV thrombus on warfarin at home. Pharmacy consulted to resume warfarin. INR on admission was therapeutic at 2.75   He is s/p successful cardioversion 10/5 and is also noted on amiodarone. INR today is supratherapeutic at 3.01, no overt bleeding or complications noted. Pt will likely need lower dose of warfarin at discharge given new amiodarone.  Home warfarin dose: 10 mg daily.    Goal of Therapy:  INR 2-2.5 Monitor platelets by anticoagulation protocol: Yes   Plan:  -Warfarin 2.5mg  PO x1 -Daily PT/INR  Fredonia Highland, PharmD PGY-2 Cardiology Pharmacy Resident Pager: [phone] 10/21/2016

## 2016-10-21 NOTE — Progress Notes (Signed)
Pt c/o right foot/toe pain. Sts it is worse than yesterday and declines ambulation. We reviewed education, discussed signs and sx of AFib. Pt needs to buy a scale today. Discussed RW with CM, which she will look into. He would benefit from RW due to foot pain.  [id number] Ethelda Chick CES, ACSM 9:43 AM 10/21/2016

## 2016-10-21 NOTE — Progress Notes (Signed)
Advanced Heart Failure Rounding Note  Primary Cardiologist: Dr. Gala Romney   Subjective:    Admitted from Med center 10/14/16 with marked volume overload and cellulitis. (Had recently been started on ABX).   CXR 10/14/16 with mild CHF and no alveolar PNA.   Underwent successful DC-CV on 10/5. Maintaining NSR. Continues with IV lasix and metolazone.  Feeling better. Having some foot pain again. Says it is along the top of his left foot and in his great toe joint.   Negative 2.8L and down another 2 lbs. (18 total).   Creatinine stable at 1.21. K 3.3. INR 3.01.  ECHO (10/15/16)  EF = 15%.  (down from 40-45%)  - suspect related to AF.   RHC 10/20/16 RA = 3 RV = 45/7 PA = 49/20 (31) PCW = 28 Fick cardiac output/index = 5.0/2.1 PVR = 0.5 WU Ao sat = 95% PA sat = 64%, 61%  Objective:   Weight Range: 279 lb 6.4 oz (126.7 kg) Body mass index is 42.48 kg/m.   Vital Signs:   Temp:  [98.1 F (36.7 C)-98.8 F (37.1 C)] 98.4 F (36.9 C) (10/09 0359) Pulse Rate:  [0-110] 89 (10/09 0359) Resp:  [0-41] 18 (10/09 0359) BP: (92-111)/(51-79) 96/51 (10/09 0359) SpO2:  [0 %-96 %] 96 % (10/09 0359) Weight:  [279 lb 6.4 oz (126.7 kg)] 279 lb 6.4 oz (126.7 kg) (10/09 0359) Last BM Date: 10/19/16  Weight change: Filed Weights   10/19/16 0546 10/20/16 0433 10/21/16 0359  Weight: 286 lb 9.6 oz (130 kg) 281 lb (127.5 kg) 279 lb 6.4 oz (126.7 kg)    Intake/Output:   Intake/Output Summary (Last 24 hours) at 10/21/16 0839 Last data filed at 10/21/16 0600  Gross per 24 hour  Intake              480 ml  Output             3315 ml  Net            -2835 ml      Physical Exam   General: Well appearing. No resp difficulty. HEENT: Normal Neck: Supple. JVP difficult to see but looks OK. Carotids 2+ bilat; no bruits. No thyromegaly or nodule noted. Cor: PMI nondisplaced. RRR, No M/G/R noted Lungs: CTAB, normal effort. Abdomen: Soft, non-tender, non-distended, no HSM. No bruits or  masses. +BS  Extremities: No cyanosis, clubbing, or rash. Trace ankle edema. + ted hose. Right foot with mild tenderness along dorsal portion of foot. + tenderness MTP joint.  Neuro: Alert & orientedx3, cranial nerves grossly intact. moves all 4 extremities w/o difficulty. Affect pleasant   Telemetry   NSR 70s, Personally reviewed.   EKG    NSR with PVCs at 90 bpm on admit 10/2  AFlutter with RVR on 10/2   Labs    CBC  Recent Labs  10/20/16 0315 10/21/16 0502  WBC 13.7* 14.7*  HGB 14.4 14.7  HCT 43.5 43.8  MCV 86.7 86.7  PLT 233 236   Basic Metabolic Panel  Recent Labs  10/20/16 0315 10/21/16 0502  NA 134* 133*  K 3.3* 3.3*  CL 88* 89*  CO2 33* 29  GLUCOSE 110* 108*  BUN 21* 22*  CREATININE 1.22 1.21  CALCIUM 8.8* 8.9  MG 1.7  --    Liver Function Tests No results for input(s): AST, ALT, ALKPHOS, BILITOT, PROT, ALBUMIN in the last 72 hours. No results for input(s): LIPASE, AMYLASE in the last 72 hours. Cardiac Enzymes  No results for input(s): CKTOTAL, CKMB, CKMBINDEX, TROPONINI in the last 72 hours.  BNP: BNP (last 3 results)  Recent Labs  10/14/16 0740  BNP 210.2*    ProBNP (last 3 results) No results for input(s): PROBNP in the last 8760 hours.   D-Dimer No results for input(s): DDIMER in the last 72 hours. Hemoglobin A1C No results for input(s): HGBA1C in the last 72 hours. Fasting Lipid Panel No results for input(s): CHOL, HDL, LDLCALC, TRIG, CHOLHDL, LDLDIRECT in the last 72 hours. Thyroid Function Tests No results for input(s): TSH, T4TOTAL, T3FREE, THYROIDAB in the last 72 hours.  Invalid input(s): FREET3  Other results:   Imaging    No results found.   Medications:     Scheduled Medications: . amiodarone  400 mg Oral BID  . digoxin  0.125 mg Oral Daily  . furosemide  80 mg Oral BID  . mouth rinse  15 mL Mouth Rinse BID  . potassium chloride  20 mEq Oral BID  . sodium chloride flush  3 mL Intravenous Q12H  . sodium  chloride flush  3 mL Intravenous Q12H  . sodium chloride flush  3 mL Intravenous Q12H  . spironolactone  12.5 mg Oral Daily  . Warfarin - Pharmacist Dosing Inpatient   Does not apply q1800    Infusions: . sodium chloride    . sodium chloride    . sodium chloride      PRN Medications: sodium chloride, sodium chloride, acetaminophen, guaiFENesin, ondansetron (ZOFRAN) IV, sodium chloride flush, sodium chloride flush, sodium chloride flush    Patient Profile   Roberto [name] is a 51 year old with a history of DVT, mural thrombus, NICM, paroxysmal SVT, PE, and morbid obesity admitted with A/C systolic heart failure.   Assessment/Plan   1. Acute on chronic systolic CHF: NICM - Echo 2016 LVEF 40-45%. ECHO down to 15% likely precipitated by infection/ A Fib RVR.    - RHC yesterday with RA 3 and wedge 28. Diuresis continued.  - BP soft but improved.  - Volume status remained mildly elevated on RHC yesterday.  - OK diuresis yesterday with lasix 80 mg po BID. Will discuss with MD stable for home on this vs transition to torsemide for increased diuresis.  - Continue to hold BB and losartan with acute decompensation and low BP. - Hopefully EF wil improve with restoration of NSR. Will need repeat echo in several months.  2. Morbid obesity - Body mass index is 42.48 kg/m.  3. H/o DVT/PE - On coumadin. INR 3.01. Discussed with Pharm-D.  4. Cellulitis/bronchitis - Improved. WBC 14.7. - Has completed course of levaquin for possible CAP.  5. A fib RVR/atrial flutter  - s/p DC-CV 10/5   - maintaining NSR amio switched to po on 10/6 - INR 3.01. 6. Hypokalemia - K remains low at 3.3. Will supp.  - Continue spiro.  7. R Foot pain - At MTP and along dorsal portion. No evidence of cellulitis and finished 5+ of broad spectrum ABX.  - Will give colchicine and treat as gout flare with significant diuresis this admission - Will need PCP follow up.   Length of Stay: [address]  Roberto [name]    10/21/2016, 8:39 AM  Advanced Heart Failure Team Pager [phone] (M-F; 7a - 4p)  Please contact [facility] for night-coverage after hours (4p -7a ) and weekends on [email or web address]  Patient seen and examined with the above-signed Advanced Practice Provider and/or Housestaff. I personally reviewed laboratory data,  imaging studies and relevant notes. I independently examined the patient and formulated the important aspects of the plan. I have edited the note to reflect any of my changes or salient points. I have personally discussed the plan with the patient and/or family.  Cath results reviewed with patient. Much improved. Seems to be responding well to oral diuretics. Maintaining NSR. Continue amiodarone and warfarin. Can go home today with close f/u. Agree with treating gout flare. Will give one dose of prednisone 40 prior to d/c.  Arvilla Meres, MD  2:48 PM

## 2016-10-21 NOTE — Progress Notes (Signed)
Discharged patient home via wheel chair.  Gave discharge instruction.  Patient verbalized understanding of medication and follow-up instructions.

## 2016-10-22 ENCOUNTER — Telehealth (HOSPITAL_COMMUNITY): Payer: Self-pay

## 2016-10-22 NOTE — Telephone Encounter (Signed)
Verified insurance. No co-payment, deductible amount is $1,080/$189.72 has been met, out of pocket amount is $4,388/0 has been met, no co-insurance, and no pre-authorization is required. Passport/reference # [id number]

## 2016-10-24 ENCOUNTER — Inpatient Hospital Stay (HOSPITAL_COMMUNITY): Admit: 2016-10-24 | Payer: BC Managed Care – PPO

## 2016-10-29 ENCOUNTER — Encounter: Payer: Self-pay | Admitting: Medical

## 2016-10-29 ENCOUNTER — Ambulatory Visit (INDEPENDENT_AMBULATORY_CARE_PROVIDER_SITE_OTHER): Payer: BC Managed Care – PPO | Admitting: Medical

## 2016-10-29 ENCOUNTER — Inpatient Hospital Stay: Payer: BC Managed Care – PPO | Admitting: Medical

## 2016-10-29 VITALS — BP 114/72 | HR 74 | Wt 286.0 lb

## 2016-10-29 DIAGNOSIS — I5023 Acute on chronic systolic (congestive) heart failure: Secondary | ICD-10-CM

## 2016-10-29 DIAGNOSIS — Z7189 Other specified counseling: Secondary | ICD-10-CM

## 2016-10-29 DIAGNOSIS — J189 Pneumonia, unspecified organism: Secondary | ICD-10-CM

## 2016-10-29 DIAGNOSIS — Z7901 Long term (current) use of anticoagulants: Secondary | ICD-10-CM

## 2016-10-29 DIAGNOSIS — Z7185 Encounter for immunization safety counseling: Secondary | ICD-10-CM

## 2016-10-29 DIAGNOSIS — M79673 Pain in unspecified foot: Secondary | ICD-10-CM | POA: Diagnosis not present

## 2016-10-29 NOTE — Progress Notes (Signed)
Subjective Chief Complaint  Patient presents with  . Hospitalization Follow-up    x 1 week for weaaknes in the heart     Here for hospital follow up.     Admit date: 10/14/16 Discharge date: 10/21/16  he was admitted for acute on chronic systolic CHF, possible pneumonia, gout flare, hypokalemia.   He initially had cellulitis of foot, had seen podiatry, followed by a few days of fever and cough with hemoptysis which led to the ED visit.   New medications started per hospitalization include Amiodarone.  Carvedilol and Losartan were stopped due to hypotension and cardiac decompensation.  Coumadin dose was lowered to 5mg  given supratherapeutirc range.   Goal 2-2.5 INR.  He finished a course of Levaquin after he was initially on broad spectrum antibiotics.  currently he notes still some cough, but much improved.  Feeling better overall.  No reported swelling, CP, SOB.  He was exercising and doing great and losing weight prior to the foot infection that led to the chain of events above.  Past Medical History:  Diagnosis Date  . Arthritis   . Chronic combined systolic and diastolic CHF (congestive heart failure) (HCC)   . Deep vein thrombosis (HCC)   . Lupus anticoagulant positive   . Morbid obesity (HCC)   . Mural thrombus of heart    coumadin  . Nonischemic cardiomyopathy (HCC)    a) 12/17/11 echo: LVEF 25-30%, grade 3 diastolic dysfunction (c/w restriction), mod MR, mod LA/LV and mild RA dilatation; b) 12/18/11 cMRI: LVEF 38%, mod LV/mild RV dilatation, global HK, mild-mod RV sys dysfxn, no LV thrombus & patchy non-subendocardial delayed enhancement c/w infil dz or prior myocarditis   . Paroxysmal SVT (supraventricular tachycardia) (HCC)   . Pulmonary embolism (HCC)    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   Current Outpatient Prescriptions on File Prior to Visit  Medication Sig Dispense Refill  . amiodarone (PACERONE) 200 MG tablet Take 2 tabs (400 mg total) twice daily until 10/13,  then take 1 tab (200 mg) twice daily until 10/20, Then take 1 tab (200 mg) once daily. 50 tablet 3  . CONTRAVE 8-90 MG TB12 TAKE 2 TABLETS BY MOUTH TWICE DAILY 360 tablet 0  . digoxin (DIGOX) 0.125 MG tablet TAKE 1 TABLET(0.125 MG) BY MOUTH DAILY 90 tablet 3  . furosemide (LASIX) 80 MG tablet Take 1 tablet (80 mg total) by mouth 2 (two) times daily. 60 tablet 5  . potassium chloride SA (K-DUR,KLOR-CON) 20 MEQ tablet Take 1 tablet (20 mEq total) by mouth 2 (two) times daily. 180 tablet 3  . spironolactone (ALDACTONE) 25 MG tablet Take 0.5 tablets (12.5 mg total) by mouth daily. 45 tablet 3  . warfarin (COUMADIN) 5 MG tablet Take 1 tablet (5 mg total) by mouth daily at 6 PM. Further per PCP 30 tablet 1   No current facility-administered medications on file prior to visit.    ROS as in subjective   Objective: BP 114/72   Pulse 74   Wt 286 lb (129.7 kg)   SpO2 96%   BMI 43.49 kg/m      General appearance: alert, no distress, WD/WN,  HEENT: normocephalic, sclerae anicteric, PERRLA, EOMi, nares patent, no discharge or erythema, pharynx normal Oral cavity: MMM, no lesions Neck: supple, no lymphadenopathy, no thyromegaly, no masses, no JVD, no bruits Heart: RRR, normal S1, S2, no murmurs Lungs: somewhat decreased sounds, otherwise no wheezes, rhonchi, or rales Musculoskeletal: nontender and no deformity of right foot,  otherwise feet nontender, no swelling, no obvious deformity Extremities: no edema, no cyanosis, no clubbing Pulses: 2+ symmetric, upper and lower extremities, normal cap refill Psychiatric: normal affect, behavior normal, pleasant    Assessment: Encounter Diagnoses  Name Primary?  . Acute on chronic systolic (congestive) heart failure (HCC) Yes  . Long term current use of anticoagulant therapy   . Vaccine counseling   . Pneumonia due to infectious organism, unspecified laterality, unspecified part of lung   . Pain of foot, unspecified laterality   . Counseling  regarding advanced directives       Plan: Reviewed discharge summary from 10/21/16, pharmacy and consult hospitalization notes, labs, imaging, cath report.  He had a drop in EF, from baseline of 40% down to 15% likely triggered by recent infection and Afib.  He is compliant with med changes  He is compliant with daily weights and when to call  He seems improved overall  He has f/u scheduled with cardiology this Friday in 2 days.  Labs today.  In a few weeks when back to baseline, consider flu shot, pneumococcal 23, shingrix.  Counseled on having a will, living will, and health care power of attorney  His foot pain has resolved, possible gout. Check uric acid next visit  Check hemoglobin A1C next visit   Roberto [name] was seen today for hospitalization follow-up.  Diagnoses and all orders for this visit:  Acute on chronic systolic (congestive) heart failure (HCC) -     CBC with Differential/Platelet -     Basic metabolic panel -     Protime-INR  Long term current use of anticoagulant therapy -     CBC with Differential/Platelet -     Basic metabolic panel -     Protime-INR  Vaccine counseling  Pneumonia due to infectious organism, unspecified laterality, unspecified part of lung  Pain of foot, unspecified laterality  Counseling regarding advanced directives

## 2016-10-29 NOTE — Patient Instructions (Signed)
Plan to return for flu shot and Pneumococcal 23 vaccine in a few week  I recommend you have a shingles vaccine to help prevent shingles or herpes zoster outbreak.   Please call your insurer to inquire about coverage for the Shingrix vaccine given in 2 doses.   Some insurers cover this vaccine after age 51, some cover this after age 44.  If your insurer covers this, then call to schedule appointment to have this vaccine here.  Make sure you ask cardiology on Friday about the medications that were stopped (Coreg and Losartan)  If any worse in the next few days, such as worse cough, fever, shortness of breath, call right away.  Continue to monitor weights.

## 2016-10-30 LAB — BASIC METABOLIC PANEL
BUN: 18 mg/dL (ref 7–25)
CALCIUM: 9.1 mg/dL (ref 8.6–10.3)
CO2: 30 mmol/L (ref 20–32)
Chloride: 98 mmol/L (ref 98–110)
Creat: 1.2 mg/dL (ref 0.70–1.33)
GLUCOSE: 84 mg/dL (ref 65–99)
Potassium: 4.3 mmol/L (ref 3.5–5.3)
Sodium: 138 mmol/L (ref 135–146)

## 2016-10-30 LAB — CBC WITH DIFFERENTIAL/PLATELET
BASOS ABS: 86 {cells}/uL (ref 0–200)
Basophils Relative: 1 %
EOS PCT: 1 %
Eosinophils Absolute: 86 cells/uL (ref 15–500)
HCT: 43.3 % (ref 38.5–50.0)
HEMOGLOBIN: 14.6 g/dL (ref 13.2–17.1)
Lymphs Abs: 1763 cells/uL (ref 850–3900)
MCH: 28.7 pg (ref 27.0–33.0)
MCHC: 33.7 g/dL (ref 32.0–36.0)
MCV: 85.1 fL (ref 80.0–100.0)
MONOS PCT: 9.6 %
MPV: 10.7 fL (ref 7.5–12.5)
NEUTROS ABS: 5839 {cells}/uL (ref 1500–7800)
NEUTROS PCT: 67.9 %
Platelets: 245 10*3/uL (ref 140–400)
RBC: 5.09 10*6/uL (ref 4.20–5.80)
RDW: 12.5 % (ref 11.0–15.0)
Total Lymphocyte: 20.5 %
WBC mixed population: 826 cells/uL (ref 200–950)
WBC: 8.6 10*3/uL (ref 3.8–10.8)

## 2016-10-30 LAB — PROTIME-INR
INR: 2.9 — AB
Prothrombin Time: 30.2 s — ABNORMAL HIGH (ref 9.0–11.5)

## 2016-10-31 ENCOUNTER — Telehealth (HOSPITAL_COMMUNITY): Payer: Self-pay

## 2016-10-31 ENCOUNTER — Ambulatory Visit (HOSPITAL_COMMUNITY)
Admit: 2016-10-31 | Discharge: 2016-10-31 | Disposition: A | Discharge status: Home / Self Care | Payer: BC Managed Care – PPO | Attending: Internal Medicine | Admitting: Internal Medicine

## 2016-10-31 VITALS — BP 118/66 | HR 83 | Wt 287.0 lb

## 2016-10-31 DIAGNOSIS — I4891 Unspecified atrial fibrillation: Secondary | ICD-10-CM | POA: Diagnosis not present

## 2016-10-31 DIAGNOSIS — I5022 Chronic systolic (congestive) heart failure: Secondary | ICD-10-CM

## 2016-10-31 DIAGNOSIS — I872 Venous insufficiency (chronic) (peripheral): Secondary | ICD-10-CM | POA: Insufficient documentation

## 2016-10-31 DIAGNOSIS — Z86711 Personal history of pulmonary embolism: Secondary | ICD-10-CM | POA: Diagnosis not present

## 2016-10-31 DIAGNOSIS — I471 Supraventricular tachycardia: Secondary | ICD-10-CM | POA: Diagnosis not present

## 2016-10-31 DIAGNOSIS — I4892 Unspecified atrial flutter: Secondary | ICD-10-CM | POA: Insufficient documentation

## 2016-10-31 DIAGNOSIS — Z86718 Personal history of other venous thrombosis and embolism: Secondary | ICD-10-CM | POA: Diagnosis not present

## 2016-10-31 DIAGNOSIS — Z79899 Other long term (current) drug therapy: Secondary | ICD-10-CM | POA: Insufficient documentation

## 2016-10-31 DIAGNOSIS — I5042 Chronic combined systolic (congestive) and diastolic (congestive) heart failure: Secondary | ICD-10-CM | POA: Diagnosis present

## 2016-10-31 DIAGNOSIS — I429 Cardiomyopathy, unspecified: Secondary | ICD-10-CM | POA: Insufficient documentation

## 2016-10-31 DIAGNOSIS — Z7901 Long term (current) use of anticoagulants: Secondary | ICD-10-CM | POA: Diagnosis not present

## 2016-10-31 LAB — COMPREHENSIVE METABOLIC PANEL
ALT: 17 U/L (ref 17–63)
AST: 24 U/L (ref 15–41)
Albumin: 3.2 g/dL — ABNORMAL LOW (ref 3.5–5.0)
Alkaline Phosphatase: 42 U/L (ref 38–126)
Anion gap: 8 (ref 5–15)
BILIRUBIN TOTAL: 0.9 mg/dL (ref 0.3–1.2)
BUN: 13 mg/dL (ref 6–20)
CALCIUM: 8.4 mg/dL — AB (ref 8.9–10.3)
CHLORIDE: 98 mmol/L — AB (ref 101–111)
CO2: 30 mmol/L (ref 22–32)
CREATININE: 1.27 mg/dL — AB (ref 0.61–1.24)
Glucose, Bld: 121 mg/dL — ABNORMAL HIGH (ref 65–99)
Potassium: 3.8 mmol/L (ref 3.5–5.1)
Sodium: 136 mmol/L (ref 135–145)
Total Protein: 7 g/dL (ref 6.5–8.1)

## 2016-10-31 MED ORDER — AMIODARONE HCL 200 MG PO TABS: 200.0000 mg | ORAL_TABLET | Freq: Two times a day (BID) | ORAL | 6 refills | Status: DC

## 2016-10-31 NOTE — Progress Notes (Signed)
ADVANCED HF CLINIC NOTE  Patient ID: Roberto [name], male   DOB: Aug 20, 1965, 51 y.o.   MRN: [id number] PCP: Dr Jacqulyn BathLong Cardiologist: Dr Jens Somrenshaw EP: Dr Johney FrameAllred Coumadin followed by PCP  HPI: Roberto [name] is a 51 yo AA male with h/o systolic HF due to NICM (EF25-30% 2013 ), h/o recurrent DVT with resultant PE and chronic venous insufficiency, h/o LV thrombus (on Coumadin), PSVT (elected for medical therapy over RFA on EP follow-up) and morbid obesity.   [facility] for recurrent HF. Diuresed 20 pounds with IV lasix. See RHC/LHC results   Admitted 10/14/16-10/21/16 with AF and tachy induced cardiomyopathy. EF dropped from 40%-> 15%. Underwent DCCV on 10/17/16 with successful conversion to NSR. Started on amiodarone. Diuresed 12 pounds with IV lasix. Discharge weight was 279 pounds. .   Returns today for HF follow up. EKG shows aflutter with rate of 115. Despite this he feels fine, weight is fairly stable. Denies orthopnea or PND. Able to do all ADLs without too much trouble,. Has been compliant with all meds. No bleeding. No palpitations. No dizziness.   05/20/2012 ECHO EF 40-45% 12/17/11 ECHO 25-30% Diffuse hypokinesis. Doppler parameters are consistent with a reversible restrictive pattern, indicative of decreased left ventricular diastolic compliance and/or increased left atrial pressure (grade 3 diastolic dysfunction).  CARDIAC MRI - 12/18/11  1. Moderately dilated left ventricle with moderately decreased systolic function, EF 38%. Global hypokinesis.  2. Mildly dilated right ventricle with mild to moderately decreased systolic function.  3. No definite LV thrombus noted.  4. Patchy non-subendocardial delayed enhancement seen in the ventricular septum (see above for description). This is not suggestive of a coronary disease pattern. This could be suggestive of infiltrative disease versus prior myocarditis.  RHC/LHC 12/22/11  Cors: Normal   SH: Lives with his wife and 5 children. Works  full time running a Daycare   ROS: All systems negative except as listed in HPI, PMH and Problem List.  Past Medical History:  Diagnosis Date  . Arthritis   . Chronic combined systolic and diastolic CHF (congestive heart failure) (HCC)   . Deep vein thrombosis (HCC)   . Lupus anticoagulant positive   . Morbid obesity (HCC)   . Mural thrombus of heart    coumadin  . Nonischemic cardiomyopathy (HCC)    a) 12/17/11 echo: LVEF 25-30%, grade 3 diastolic dysfunction (c/w restriction), mod MR, mod LA/LV and mild RA dilatation; b) 12/18/11 cMRI: LVEF 38%, mod LV/mild RV dilatation, global HK, mild-mod RV sys dysfxn, no LV thrombus & patchy non-subendocardial delayed enhancement c/w infil dz or prior myocarditis   . Paroxysmal SVT (supraventricular tachycardia) (HCC)   . Pulmonary embolism (HCC)    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    Current Outpatient Prescriptions  Medication Sig Dispense Refill  . amiodarone (PACERONE) 200 MG tablet Take 2 tabs (400 mg total) twice daily until 10/13, then take 1 tab (200 mg) twice daily until 10/20, Then take 1 tab (200 mg) once daily. 50 tablet 3  . CONTRAVE 8-90 MG TB12 TAKE 2 TABLETS BY MOUTH TWICE DAILY 360 tablet 0  . digoxin (DIGOX) 0.125 MG tablet TAKE 1 TABLET(0.125 MG) BY MOUTH DAILY 90 tablet 3  . furosemide (LASIX) 80 MG tablet Take 1 tablet (80 mg total) by mouth 2 (two) times daily. 60 tablet 5  . potassium chloride SA (K-DUR,KLOR-CON) 20 MEQ tablet Take 1 tablet (20 mEq total) by mouth 2 (two) times daily. 180 tablet 3  .  spironolactone (ALDACTONE) 25 MG tablet Take 0.5 tablets (12.5 mg total) by mouth daily. 45 tablet 3  . warfarin (COUMADIN) 5 MG tablet Take 1 tablet (5 mg total) by mouth daily at 6 PM. Further per PCP 30 tablet 1   No current facility-administered medications for this encounter.      PHYSICAL EXAM: Vitals:   10/31/16 1505  BP: 118/66  Pulse: 83  SpO2: 97%  Weight: 287 lb (130.2 kg)    General: Sitting in  chair. No resp difficulty. HEENT: Normal Neck: Supple. JVP 5-6. Carotids 2+ bilat; no bruits. No thyromegaly or nodule noted. Cor: PMI laterally displaced. Irregular. Tachy , No M/G/R noted Lungs: CTAB, normal effort. No wheeze Abdomen: obese Soft, non-tender, non-distended, no HSM. No bruits or masses. +BS  Extremities: No cyanosis, clubbing, or rash. R and LLE no edema. Warm  Neuro: Alert & orientedx3, cranial nerves grossly intact. moves all 4 extremities w/o difficulty. Affect pleasant   ASSESSMENT & PLAN:  1. Chronic Systolic Heart Failure: Recent Echo EF 15%, drop from 40-45% in 2016, felt to be related to Afib.  - Admitted last week and diuresed 12 pounds.  - Volume status looks good.  - Remains NYHA II-III  Despite recurrent AFL  - Continue spiro and digoxin - No b-blocker with low output/recent decompensation - BP too low for losartan or Entresto currently - Check labs  2. Recurrent AFL  - he is s/p recent DC-CV. Back in AFL with RVR despite amio - I discussed with Dr. Johney Frame. Will plan attempt at AFL ablation in near future - Continue Xarelto. - Increase amio to 200 bid  - Echo reviewed personally LA 5.4 cm - Will need sleep study  3. DVT/PE-On Xarelto. Followed by Dr Jacqulyn Bath  4. Morbid Obestiy  - stressed need for weight loss   Lakoda Raske, MD  (patient seen in conjunction with Suzzette Righter NP-C) 11:39 PM

## 2016-10-31 NOTE — Patient Instructions (Addendum)
INCREASE Amiodarone to 200 mg tablet twice daily.  Will refer you to electrophysiology at [facility] with Dr. Hillis Range for atrial flutter. Address: [address] ([address]), [place], [place] [address]  Phone: [phone] Office will contact you by phone to schedule.  Routine lab work today. Will notify you of abnormal results, otherwise no news is good news!  Follow up 6-8 weeks.  _____________________________________________________________  [place] Code:    Take all medication as prescribed the day of your appointment. Bring all medications with you to your appointment.  Do the following things EVERYDAY: 1) Weigh yourself in the morning before breakfast. Write it down and keep it in a log. 2) Take your medicines as prescribed 3) Eat low salt foods-Limit salt (sodium) to 2000 mg per day.  4) Stay as active as you can everyday 5) Limit all fluids for the day to less than 2 liters

## 2016-10-31 NOTE — Telephone Encounter (Signed)
CHF Clinic appointment reminder call placed to patient for upcoming post-hospital follow up.  LVMTCB.   Patient also reminded to take all medications as prescribed on the day of his/her appointment and to bring all medications to this appointment.  Advised to call our office for tardiness or cancellations/rescheduling needs.  .[place]ley, Megan Genevea  

## 2016-11-03 ENCOUNTER — Other Ambulatory Visit (HOSPITAL_COMMUNITY): Payer: Self-pay

## 2016-11-05 ENCOUNTER — Other Ambulatory Visit: Payer: Self-pay | Admitting: Cardiology

## 2016-11-05 ENCOUNTER — Encounter: Payer: Self-pay | Admitting: *Deleted

## 2016-11-05 ENCOUNTER — Ambulatory Visit (INDEPENDENT_AMBULATORY_CARE_PROVIDER_SITE_OTHER): Payer: BC Managed Care – PPO | Admitting: Cardiology

## 2016-11-05 ENCOUNTER — Encounter: Payer: Self-pay | Admitting: Cardiology

## 2016-11-05 VITALS — BP 108/68 | HR 75 | Ht 68.0 in | Wt 288.0 lb

## 2016-11-05 DIAGNOSIS — I428 Other cardiomyopathies: Secondary | ICD-10-CM | POA: Diagnosis not present

## 2016-11-05 DIAGNOSIS — I4892 Unspecified atrial flutter: Secondary | ICD-10-CM | POA: Diagnosis not present

## 2016-11-05 DIAGNOSIS — Z01812 Encounter for preprocedural laboratory examination: Secondary | ICD-10-CM

## 2016-11-05 DIAGNOSIS — G4733 Obstructive sleep apnea (adult) (pediatric): Secondary | ICD-10-CM

## 2016-11-05 NOTE — Patient Instructions (Addendum)
Medication Instructions:  Your physician recommends that you continue on your current medications as directed. Please refer to the Current Medication list given to you today.  Labwork: Pre procedure labs today: BMET & CBC w/ diff  Testing/Procedures: Your physician has recommended that you have an ablation. Catheter ablation is a medical procedure used to treat some cardiac arrhythmias (irregular heartbeats). During catheter ablation, a long, thin, flexible tube is put into a blood vessel in your groin (upper thigh), or neck. This tube is called an ablation catheter. It is then guided to your heart through the blood vessel. Radio frequency waves destroy small areas of heart tissue where abnormal heartbeats may cause an arrhythmia to start. Please see the instruction sheet given to you today.  Follow-Up: Your physician recommends that you schedule a follow-up appointment in: 4 weeks, after your procedure on 11/17/2016, with Dr. Elberta Fortisamnitz.  -- If you need a refill on your cardiac medications before your next appointment, please call your pharmacy. --  Thank you for choosing [facility]!!    Dory HornSherri Price, RN [phone]  Any Other Special Instructions Will Be Listed Below (If Applicable).   Cardiac Ablation Cardiac ablation is a procedure to disable (ablate) a small amount of heart tissue in very specific places. The heart has many electrical connections. Sometimes these connections are abnormal and can cause the heart to beat very fast or irregularly. Ablating some of the problem areas can improve the heart rhythm or return it to normal. Ablation may be done for people who:  Have Wolff-Parkinson-White syndrome.  Have fast heart rhythms (tachycardia).  Have taken medicines for an abnormal heart rhythm (arrhythmia) that were not effective or caused side effects.  Have a high-risk heartbeat that may be life-threatening.  During the procedure, a small incision is made in the neck or  the groin, and a long, thin, flexible tube (catheter) is inserted into the incision and moved to the heart. Small devices (electrodes) on the tip of the catheter will send out electrical currents. A type of X-ray (fluoroscopy) will be used to help guide the catheter and to provide images of the heart. Tell a health care provider about:  Any allergies you have.  All medicines you are taking, including vitamins, herbs, eye drops, creams, and over-the-counter medicines.  Any problems you or family members have had with anesthetic medicines.  Any blood disorders you have.  Any surgeries you have had.  Any medical conditions you have, such as kidney failure.  Whether you are pregnant or may be pregnant. What are the risks? Generally, this is a safe procedure. However, problems may occur, including:  Infection.  Bruising and bleeding at the catheter insertion site.  Bleeding into the chest, especially into the sac that surrounds the heart. This is a serious complication.  Stroke or blood clots.  Damage to other structures or organs.  Allergic reaction to medicines or dyes.  Need for a permanent pacemaker if the normal electrical system is damaged. A pacemaker is a small computer that sends electrical signals to the heart and helps your heart beat normally.  The procedure not being fully effective. This may not be recognized until months later. Repeat ablation procedures are sometimes required.  What happens before the procedure?  Follow instructions from your health care provider about eating or drinking restrictions.  Ask your health care provider about: ? Changing or stopping your regular medicines. This is especially important if you are taking diabetes medicines or blood thinners. ? Taking  medicines such as aspirin and ibuprofen. These medicines can thin your blood. Do not take these medicines before your procedure if your health care provider instructs you not to.  Plan to  have someone take you home from the hospital or clinic.  If you will be going home right after the procedure, plan to have someone with you for 24 hours. What happens during the procedure?  To lower your risk of infection: ? Your health care team will wash or sanitize their hands. ? Your skin will be washed with soap. ? Hair may be removed from the incision area.  An IV tube will be inserted into one of your veins.  You will be given a medicine to help you relax (sedative).  The skin on your neck or groin will be numbed.  An incision will be made in your neck or your groin.  A needle will be inserted through the incision and into a large vein in your neck or groin.  A catheter will be inserted into the needle and moved to your heart.  Dye may be injected through the catheter to help your surgeon see the area of the heart that needs treatment.  Electrical currents will be sent from the catheter to ablate heart tissue in desired areas. There are three types of energy that may be used to ablate heart tissue: ? Heat (radiofrequency energy). ? Laser energy. ? Extreme cold (cryoablation).  When the necessary tissue has been ablated, the catheter will be removed.  Pressure will be held on the catheter insertion area to prevent excessive bleeding.  A bandage (dressing) will be placed over the catheter insertion area. The procedure may vary among health care providers and hospitals. What happens after the procedure?  Your blood pressure, heart rate, breathing rate, and blood oxygen level will be monitored until the medicines you were given have worn off.  Your catheter insertion area will be monitored for bleeding. You will need to lie still for a few hours to ensure that you do not bleed from the catheter insertion area.  Do not drive for 24 hours or as long as directed by your health care provider. Summary  Cardiac ablation is a procedure to disable (ablate) a small amount of  heart tissue in very specific places. Ablating some of the problem areas can improve the heart rhythm or return it to normal.  During the procedure, electrical currents will be sent from the catheter to ablate heart tissue in desired areas. This information is not intended to replace advice given to you by your health care provider. Make sure you discuss any questions you have with your health care provider. Document Released: 05/18/2008 Document Revised: 11/19/2015 Document Reviewed: 11/19/2015 Elsevier Interactive Patient Education  [employer].

## 2016-11-05 NOTE — Progress Notes (Signed)
Electrophysiology Office Note   Date:  11/05/2016   ID:  Roberto FillJoseph T Harig, DOB 1965-04-04, MRN [id number]  PCP:  Jac Canavanysinger, David S, PA-C  Cardiologist:  Bensimhon Primary Electrophysiologist:  Ottilie Wigglesworth Jorja LoaMartin Darrick Greenlaw, MD    Chief Complaint  Patient presents with  . Atrial Flutter     History of Present Illness: Roberto [name] is a 51 y.o. male who is being seen today for the evaluation of atrial flutter at the request of Jac Canavanysinger, David S, PA-C. Presenting today for electrophysiology evaluation. 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 He was admitted on 10/14/16 with atrial fibrillation and tachycardia-induced myopathy. His EF dropped from 40% to 15%. He underwent cardioversion to sinus rhythm and was placed on amiodarone. He was diuresed 12 pounds with a discharge weight of 279 pounds. He returned to her failure clinic in atrial flutter with a rate of 115. At that time he was not having any symptoms.  Today, he denies symptoms of palpitations, chest pain, shortness of breath, orthopnea, PND, lower extremity edema, claudication, dizziness, presyncope, syncope, bleeding, or neurologic sequela. The patient is tolerating medications without difficulties. He currently feels well. He is unaware of arrhythmias or heart failure. He is not short of breath and is without palpitations.   Past Medical History:  Diagnosis Date  . Arthritis   . Chronic combined systolic and diastolic CHF (congestive heart failure) (HCC)   . Deep vein thrombosis (HCC)   . Lupus anticoagulant positive   . Morbid obesity (HCC)   . Mural thrombus of heart    coumadin  . Nonischemic cardiomyopathy (HCC)    a) 12/17/11 echo: LVEF 25-30%, grade 3 diastolic dysfunction (c/w restriction), mod MR, mod LA/LV and mild RA dilatation; b) 12/18/11 cMRI: LVEF 38%, mod LV/mild RV  dilatation, global HK, mild-mod RV sys dysfxn, no LV thrombus & patchy non-subendocardial delayed enhancement c/w infil dz or prior myocarditis   . Paroxysmal SVT (supraventricular tachycardia) (HCC)   . Pulmonary embolism (HCC)    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   Past Surgical History:  Procedure Laterality Date  . CARDIAC CATHETERIZATION  06/2006   Angiographically normal cors  . CARDIAC CATHETERIZATION  12/22/2011   R/LHC: normal cors, well-compensated HDs, LV dysfxn  . CARDIOVERSION N/A 10/17/2016   Procedure: CARDIOVERSION;  Surgeon: Dolores PattyBensimhon, Daniel R, MD;  [facility];  Service: Cardiovascular;  Laterality: N/A;  . LEFT AND RIGHT HEART CATHETERIZATION WITH CORONARY ANGIOGRAM N/A 12/22/2011   Procedure: LEFT AND RIGHT HEART CATHETERIZATION WITH CORONARY ANGIOGRAM;  Surgeon: Dolores Pattyaniel R Bensimhon, MD;  [facility];  Service: Cardiovascular;  Laterality: N/A;  . PATELLAR TENDON REPAIR     Left  . RIGHT HEART CATH N/A 10/20/2016   Procedure: RIGHT HEART CATH;  Surgeon: Dolores PattyBensimhon, Daniel R, MD;  [facility];  Service: Cardiovascular;  Laterality: N/A;     Current Outpatient Prescriptions  Medication Sig Dispense Refill  . amiodarone (PACERONE) 200 MG tablet Take 1 tablet (200 mg total) by mouth 2 (two) times daily. 60 tablet 6  . CONTRAVE 8-90 MG TB12 TAKE 2 TABLETS BY MOUTH TWICE DAILY 360 tablet 0  . digoxin (DIGOX) 0.125 MG tablet TAKE 1 TABLET(0.125 MG) BY MOUTH DAILY 90 tablet 3  . furosemide (LASIX) 80 MG tablet Take 1 tablet (80 mg total) by mouth 2 (two) times  daily. 60 tablet 5  . potassium chloride SA (K-DUR,KLOR-CON) 20 MEQ tablet Take 1 tablet (20 mEq total) by mouth 2 (two) times daily. 180 tablet 3  . spironolactone (ALDACTONE) 25 MG tablet Take 0.5 tablets (12.5 mg total) by mouth daily. 45 tablet 3  . warfarin (COUMADIN) 5 MG tablet Take 1 tablet (5 mg total) by mouth daily at 6 PM. Further per PCP 30 tablet 1   No current  facility-administered medications for this visit.     Allergies:   Shrimp [shellfish allergy]   Social History:  The patient  reports that he has never smoked. He has never used smokeless tobacco. He reports that he does not drink alcohol or use drugs.   Family History:  [name] family history includes Cancer in his mother; Clotting disorder in his mother; Diabetes in his father, other, and sister; Heart attack in his father; Hypertension in his mother and other; Obesity in his other, sister, and sister.    ROS:  Please see the history of present illness.   Otherwise, review of systems is positive for none.   All other systems are reviewed and negative.    PHYSICAL EXAM: VS:  BP 108/68   Pulse 75   Ht 5\' 8"  (1.727 m)   Wt 288 lb (130.6 kg)   BMI 43.79 kg/m  , BMI Body mass index is 43.79 kg/m. GEN: Well nourished, well developed, in no acute distress  HEENT: normal  Neck: no JVD, carotid bruits, or masses Cardiac: RRR; no murmurs, rubs, or gallops,no edema  Respiratory:  clear to auscultation bilaterally, normal work of breathing GI: soft, nontender, nondistended, + BS MS: no deformity or atrophy  Skin: warm and dry Neuro:  Strength and sensation are intact Psych: euthymic mood, full affect  EKG:  EKG is ordered today. Personal review of the ekg ordered shows sinus rhythm, rate 75, PVC, LAE  Recent Labs: 10/14/2016: B Natriuretic Peptide 210.2; TSH 1.946 10/20/2016: Magnesium 1.7 10/29/2016: Hemoglobin 14.6; Platelets 245 10/31/2016: ALT 17; BUN 13; Creatinine, Ser 1.27; Potassium 3.8; Sodium 136    Lipid Panel     Component Value Date/Time   CHOL 107 (L) 09/18/2015 1013   TRIG 68 09/18/2015 1013   HDL 40 09/18/2015 1013   CHOLHDL 2.7 09/18/2015 1013   VLDL 14 09/18/2015 1013   LDLCALC 53 09/18/2015 1013     Wt Readings from Last 3 Encounters:  11/05/16 288 lb (130.6 kg)  10/31/16 287 lb (130.2 kg)  10/29/16 286 lb (129.7 kg)      Other studies  Reviewed: Additional studies/ records that were reviewed today include: TTE 10/15/16  Review of the above records today demonstrates:  - Left ventricle: The cavity size was moderately dilated. Wall   thickness was normal. The estimated ejection fraction was 15%.   Diffuse hypokinesis. Indeterminant diastolic function, appears to   be in atrial flutter. No LV thrombus noted. - Aortic valve: Poorly visualized. Probably trileaflet; mildly   calcified leaflets. There was no stenosis. - Mitral valve: There was no significant regurgitation. - Left atrium: The atrium was mildly to moderately dilated. - Right ventricle: Poorly visualized. The cavity size was   moderately dilated. Systolic function was moderately reduced. - Right atrium: The atrium was moderately dilated. - Pulmonary arteries: No complete TR doppler jet so unable to   estimate PA systolic pressure. - Systemic veins: IVC measured 3 cm with < 50% respirophasic   variation, suggesting RA pressure 15 mmHg.  ASSESSMENT AND PLAN:  1.  Chronic systolic heart failure: Recent EF dropped to 15% from 40-45% in 2016 felt to be related to tachyarrhythmia. Currently is on optimal medical therapy, though blood pressure is too low for ARB's.  2. Atrial flutter: Has been cardioverted and is back in flutter with rapid rates. Currently on Xarelto. I discussed with him options of continuing amiodarone versus ablation. Risks and benefits of ablation were discussed. Risks include bleeding, tamponade, heart block, and stroke, among others. He understands these risks and has agreed to the procedure.  This patients CHA2DS2-VASc Score and unadjusted Ischemic Stroke Rate (% per year) is equal to 0.6 % stroke rate/year from a score of 1  Above score calculated as 1 point each if present [CHF, HTN, DM, Vascular=MI/PAD/Aortic Plaque, Age if 65-74, or Male] Above score calculated as 2 points each if present [Age > 75, or Stroke/TIA/TE]   3. Morbid  obesity: Encouraged weight loss  4. DVT/PE: On Xarelto followed by Dr. Jacqulyn Bath.    Current medicines are reviewed at length with the patient today.   The patient does not have concerns regarding his medicines.  The following changes were made today:  none  Labs/ tests ordered today include:  Orders Placed This Encounter  Procedures  . Basic Metabolic Panel (BMET)  . CBC w/Diff  . EKG 12-Lead     Disposition:   FU with Soraya Paquette 3 months  Signed, Wisam Siefring Jorja Loa, MD  11/05/2016 3:33 PM     [facility] [address] [place] [address] [phone] (office) [phone] (fax)

## 2016-11-06 LAB — CBC WITH DIFFERENTIAL/PLATELET
BASOS ABS: 0 10*3/uL (ref 0.0–0.2)
BASOS: 0 %
EOS (ABSOLUTE): 0.2 10*3/uL (ref 0.0–0.4)
Eos: 3 %
HEMOGLOBIN: 14.1 g/dL (ref 13.0–17.7)
Hematocrit: 42.4 % (ref 37.5–51.0)
IMMATURE GRANS (ABS): 0 10*3/uL (ref 0.0–0.1)
IMMATURE GRANULOCYTES: 0 %
LYMPHS: 26 %
Lymphocytes Absolute: 2.4 10*3/uL (ref 0.7–3.1)
MCH: 29.1 pg (ref 26.6–33.0)
MCHC: 33.3 g/dL (ref 31.5–35.7)
MCV: 88 fL (ref 79–97)
Monocytes Absolute: 0.8 10*3/uL (ref 0.1–0.9)
Monocytes: 8 %
NEUTROS ABS: 5.6 10*3/uL (ref 1.4–7.0)
NEUTROS PCT: 63 %
PLATELETS: 178 10*3/uL (ref 150–379)
RBC: 4.84 x10E6/uL (ref 4.14–5.80)
RDW: 14 % (ref 12.3–15.4)
WBC: 9.1 10*3/uL (ref 3.4–10.8)

## 2016-11-06 LAB — BASIC METABOLIC PANEL
BUN/Creatinine Ratio: 11 (ref 9–20)
BUN: 13 mg/dL (ref 6–24)
CALCIUM: 9.2 mg/dL (ref 8.7–10.2)
CHLORIDE: 99 mmol/L (ref 96–106)
CO2: 27 mmol/L (ref 20–29)
Creatinine, Ser: 1.15 mg/dL (ref 0.76–1.27)
GFR calc Af Amer: 85 mL/min/{1.73_m2} (ref >59)
GFR calc non Af Amer: 73 mL/min/{1.73_m2} (ref >59)
Glucose: 92 mg/dL (ref 65–99)
POTASSIUM: 4 mmol/L (ref 3.5–5.2)
SODIUM: 142 mmol/L (ref 134–144)

## 2016-11-07 ENCOUNTER — Encounter: Payer: Self-pay | Admitting: *Deleted

## 2016-11-07 ENCOUNTER — Telehealth: Payer: Self-pay | Admitting: Cardiology

## 2016-11-07 NOTE — Telephone Encounter (Signed)
Calling pt to inform him of lab results, pt stated that he is experiencing a dry cough and would like a note for being out of work until November 24, 2016. Please address

## 2016-11-07 NOTE — Telephone Encounter (Signed)
°  New Prob   Pt is requesting a note stating that he was hospitalized from 10/14/16-10/21/16 and is not clear to return to work until 11/24/16. Please call.

## 2016-11-07 NOTE — Telephone Encounter (Signed)
Updated pt that work note from Dr. Elberta Fortis can only be for one week for ablation.  The extended time out of work/hospital needs to be addressed by heart failure clinic, who follows him. Advised to also discuss cough with heart failure clinic per Dr. Elberta Fortis.  Explained that since he has only seen the patient one time and he is established w/ heart failure clinic that it may be better for them to address. Patient verbalized understanding and agreeable to plan. He will contact them to discuss both needs/issue.

## 2016-11-07 NOTE — Telephone Encounter (Signed)
Informed pt I would type a work note for him and fax to requested phone number [phone]).  He also reports a dry cough. Worsens when he rests/lays down.  States that last time he experienced this he had "a lung blood clot". Will forward to Dr. Elberta Fortis for advisement. Pt aware I will call him once reviewed by the physician.

## 2016-11-10 ENCOUNTER — Encounter (HOSPITAL_COMMUNITY): Payer: Self-pay | Admitting: *Deleted

## 2016-11-10 ENCOUNTER — Telehealth (HOSPITAL_COMMUNITY): Payer: Self-pay | Admitting: *Deleted

## 2016-11-10 NOTE — Telephone Encounter (Signed)
Pt called earlier today stating he has been out of work since 10/14/16 when he went to the hospital and he needs a note for work stating that.  Per Dr Gala Romney ok for note to keep pt out until after his ablation with Dr Elberta Fortis.  Letter completed and signed by Dr Gala Romney.  Pt aware, letter faxed to him at [phone]

## 2016-11-13 ENCOUNTER — Other Ambulatory Visit (HOSPITAL_COMMUNITY): Payer: Self-pay | Admitting: *Deleted

## 2016-11-13 DIAGNOSIS — I5022 Chronic systolic (congestive) heart failure: Secondary | ICD-10-CM

## 2016-11-13 MED ORDER — DIGOXIN 125 MCG PO TABS: ORAL_TABLET | ORAL | 3 refills | Status: DC

## 2016-11-13 MED ORDER — AMIODARONE HCL 200 MG PO TABS: 200.0000 mg | ORAL_TABLET | Freq: Two times a day (BID) | ORAL | 6 refills | Status: DC

## 2016-11-13 MED ORDER — POTASSIUM CHLORIDE CRYS ER 20 MEQ PO TBCR: 20.0000 meq | EXTENDED_RELEASE_TABLET | Freq: Two times a day (BID) | ORAL | 3 refills | Status: DC

## 2016-11-13 MED ORDER — SPIRONOLACTONE 25 MG PO TABS: 12.5000 mg | ORAL_TABLET | Freq: Every day | ORAL | 3 refills | Status: DC

## 2016-11-13 MED ORDER — FUROSEMIDE 80 MG PO TABS: 80.0000 mg | ORAL_TABLET | Freq: Two times a day (BID) | ORAL | 5 refills | Status: DC

## 2016-11-13 NOTE — Telephone Encounter (Signed)
Advanced Heart Failure Triage Encounter  Patient Name: SHENG FOXWELL  Date of Call: 11/13/16  Problem:   Patient called requesting for refills.  Refills requested sent to pharmacy.  Office notes reports patient being on Xarelto.  I verified with patient that patient is taking coumadin and managed by Dr. Kristian Covey, he is not taking Xarelto.   Plan:  Will forward to Dr. Elberta Fortis and Dr. Gala Romney so they are aware.   Georgina Peer, RN

## 2016-11-17 ENCOUNTER — Encounter (HOSPITAL_COMMUNITY)
Admission: RE | Disposition: A | Discharge status: Home / Self Care | Payer: Self-pay | Source: Ambulatory Visit | Attending: Cardiology

## 2016-11-17 ENCOUNTER — Ambulatory Visit (HOSPITAL_COMMUNITY)
Admission: RE | Admit: 2016-11-17 | Discharge: 2016-11-17 | Disposition: A | Discharge status: Home / Self Care | Payer: BC Managed Care – PPO | Source: Ambulatory Visit | Attending: Cardiology | Admitting: Cardiology

## 2016-11-17 DIAGNOSIS — I4892 Unspecified atrial flutter: Secondary | ICD-10-CM | POA: Insufficient documentation

## 2016-11-17 DIAGNOSIS — I483 Typical atrial flutter: Secondary | ICD-10-CM | POA: Diagnosis not present

## 2016-11-17 DIAGNOSIS — I5020 Unspecified systolic (congestive) heart failure: Secondary | ICD-10-CM | POA: Insufficient documentation

## 2016-11-17 HISTORY — PX: A-FLUTTER ABLATION: EP1230

## 2016-11-17 LAB — PROTIME-INR
INR: 1.42
Prothrombin Time: 17.2 seconds — ABNORMAL HIGH (ref 11.4–15.2)

## 2016-11-17 SURGERY — A-FLUTTER ABLATION

## 2016-11-17 MED ORDER — MIDAZOLAM HCL 5 MG/5ML IJ SOLN
INTRAMUSCULAR | Status: AC
  Filled 2016-11-17: qty 5

## 2016-11-17 MED ORDER — BUPIVACAINE HCL (PF) 0.25 % IJ SOLN
INTRAMUSCULAR | Status: DC | PRN
  Administered 2016-11-17: 35 mL

## 2016-11-17 MED ORDER — SODIUM CHLORIDE 0.9% FLUSH: 3.0000 mL | INTRAVENOUS | Status: DC | PRN

## 2016-11-17 MED ORDER — FENTANYL CITRATE (PF) 100 MCG/2ML IJ SOLN
INTRAMUSCULAR | Status: AC
  Filled 2016-11-17: qty 2

## 2016-11-17 MED ORDER — POTASSIUM CHLORIDE CRYS ER 20 MEQ PO TBCR: 20.0000 meq | EXTENDED_RELEASE_TABLET | Freq: Two times a day (BID) | ORAL | Status: DC

## 2016-11-17 MED ORDER — SPIRONOLACTONE 12.5 MG HALF TABLET: 12.5000 mg | ORAL_TABLET | Freq: Every day | ORAL | Status: DC

## 2016-11-17 MED ORDER — HEPARIN (PORCINE) IN NACL 2-0.9 UNIT/ML-% IJ SOLN
INTRAMUSCULAR | Status: AC | PRN
  Administered 2016-11-17: 500 mL

## 2016-11-17 MED ORDER — WARFARIN SODIUM 5 MG PO TABS: 5.0000 mg | ORAL_TABLET | ORAL | Status: DC

## 2016-11-17 MED ORDER — ACETAMINOPHEN 325 MG PO TABS
ORAL_TABLET | ORAL | Status: AC
  Administered 2016-11-17: 650 mg via ORAL
  Filled 2016-11-17: qty 2

## 2016-11-17 MED ORDER — NALTREXONE-BUPROPION HCL ER 8-90 MG PO TB12: 2.0000 | ORAL_TABLET | Freq: Two times a day (BID) | ORAL | Status: DC

## 2016-11-17 MED ORDER — ONDANSETRON HCL 4 MG/2ML IJ SOLN: 4.0000 mg | Freq: Four times a day (QID) | INTRAMUSCULAR | Status: DC | PRN

## 2016-11-17 MED ORDER — BUPIVACAINE HCL (PF) 0.25 % IJ SOLN
INTRAMUSCULAR | Status: AC
  Filled 2016-11-17: qty 60

## 2016-11-17 MED ORDER — SODIUM CHLORIDE 0.9% FLUSH: 3.0000 mL | Freq: Two times a day (BID) | INTRAVENOUS | Status: DC

## 2016-11-17 MED ORDER — MIDAZOLAM HCL 5 MG/5ML IJ SOLN
INTRAMUSCULAR | Status: DC | PRN
  Administered 2016-11-17: 1 mg via INTRAVENOUS
  Administered 2016-11-17: 2 mg via INTRAVENOUS
  Administered 2016-11-17: 1 mg via INTRAVENOUS
  Administered 2016-11-17: 2 mg via INTRAVENOUS
  Administered 2016-11-17 (×4): 1 mg via INTRAVENOUS
  Administered 2016-11-17: 2 mg via INTRAVENOUS
  Administered 2016-11-17: 1 mg via INTRAVENOUS

## 2016-11-17 MED ORDER — AMIODARONE HCL 200 MG PO TABS: 200.0000 mg | ORAL_TABLET | Freq: Two times a day (BID) | ORAL | Status: DC

## 2016-11-17 MED ORDER — HEPARIN (PORCINE) IN NACL 2-0.9 UNIT/ML-% IJ SOLN
INTRAMUSCULAR | Status: AC
  Filled 2016-11-17: qty 500

## 2016-11-17 MED ORDER — FUROSEMIDE 80 MG PO TABS: 80.0000 mg | ORAL_TABLET | Freq: Two times a day (BID) | ORAL | Status: DC

## 2016-11-17 MED ORDER — FENTANYL CITRATE (PF) 100 MCG/2ML IJ SOLN
INTRAMUSCULAR | Status: DC | PRN
  Administered 2016-11-17 (×3): 25 ug via INTRAVENOUS
  Administered 2016-11-17: 50 ug via INTRAVENOUS
  Administered 2016-11-17 (×3): 25 ug via INTRAVENOUS

## 2016-11-17 MED ORDER — ACETAMINOPHEN 325 MG PO TABS
650.0000 mg | ORAL_TABLET | ORAL | Status: DC | PRN
  Administered 2016-11-17: 650 mg via ORAL

## 2016-11-17 MED ORDER — SODIUM CHLORIDE 0.9 % IV SOLN: 250.0000 mL | INTRAVENOUS | Status: DC | PRN

## 2016-11-17 MED ORDER — ACETAMINOPHEN 500 MG PO TABS: 500.0000 mg | ORAL_TABLET | Freq: Four times a day (QID) | ORAL | Status: DC | PRN

## 2016-11-17 SURGICAL SUPPLY — 13 items
CATH EZ STEER NAV 8MM D-F CUR (ABLATOR) ×1 IMPLANT
CATH JOSEPHSON QUAD-ALLRED 6FR (CATHETERS) ×1 IMPLANT
CATH WEBSTER BI DIR CS D-F CRV (CATHETERS) ×1 IMPLANT
HOVERMATT SINGLE USE (MISCELLANEOUS) ×1 IMPLANT
PACK EP LATEX FREE (CUSTOM PROCEDURE TRAY) ×2
PACK EP LF (CUSTOM PROCEDURE TRAY) ×1 IMPLANT
PAD DEFIB LIFELINK (PAD) ×2 IMPLANT
PATCH CARTO3 (PAD) ×1 IMPLANT
SHEATH PINNACLE 6F 10CM (SHEATH) ×1 IMPLANT
SHEATH PINNACLE 7F 10CM (SHEATH) ×1 IMPLANT
SHEATH PINNACLE 8F 10CM (SHEATH) ×1 IMPLANT
SHEATH PINNACLE 9F 10CM (SHEATH) ×1 IMPLANT
SHEATH SWARTZ RAMP 8.5F 60CM (SHEATH) ×1 IMPLANT

## 2016-11-17 NOTE — Progress Notes (Signed)
Patient feels well, no CP, palpitations or SOB. Telemetry is SR, VSS R groin, procedure site is dry, no bleeding, no hematoma, non-tender. Activity restrictions and site care were discussed with the patient Patient's INR is monitored and managed by his PMD office, advised to continuehis warfarin without interruption and get INR check by Wed-Thursday this week, he state he will be able to. No changes to his medicines  [name], PA-C

## 2016-11-17 NOTE — Discharge Instructions (Signed)
No driving for 4 days. No lifting over 5 lbs for 1 week. No vigorous or sexual activity for 1 week. You may return to work on 11/24/16. Keep procedure site clean & dry. If you notice increased pain, swelling, bleeding or pus, call/return!  You may shower, but no soaking baths/hot tubs/pools for 1 week.  ° ° ° °Femoral Site Care °Refer to this sheet in the next few weeks. These instructions provide you with information about caring for yourself after your procedure. Your health care provider may also give you more specific instructions. Your treatment has been planned according to current medical practices, but problems sometimes occur. Call your health care provider if you have any problems or questions after your procedure. °What can I expect after the procedure? °After your procedure, it is typical to have the following: °· Bruising at the site that usually fades within 1-2 weeks. °· Blood collecting in the tissue (hematoma) that may be painful to the touch. It should usually decrease in size and tenderness within 1-2 weeks. ° °Follow these instructions at home: °· Take medicines only as directed by your health care provider. °· You may shower 24-48 hours after the procedure or as directed by your health care provider. Remove the bandage (dressing) and gently wash the site with plain soap and water. Pat the area dry with a clean towel. Do not rub the site, because this may cause bleeding. °· Do not take baths, swim, or use a hot tub until your health care provider approves. °· Check your insertion site every day for redness, swelling, or drainage. °· Do not apply powder or lotion to the site. °· Limit use of stairs to twice a day for the first 2-3 days or as directed by your health care provider. °· Do not squat for the first 2-3 days or as directed by your health care provider. °· Do not lift over 10 lb (4.5 kg) for 5 days after your procedure or as directed by your health care provider. °· Ask your health care  provider when it is okay to: °? Return to work or school. °? Resume usual physical activities or sports. °? Resume sexual activity. °· Do not drive home if you are discharged the same day as the procedure. Have someone else drive you. °· You may drive 24 hours after the procedure unless otherwise instructed by your health care provider. °· Do not operate machinery or power tools for 24 hours after the procedure or as directed by your health care provider. °· If your procedure was done as an outpatient procedure, which means that you went home the same day as your procedure, a responsible adult should be with you for the first 24 hours after you arrive home. °· Keep all follow-up visits as directed by your health care provider. This is important. °Contact a health care provider if: °· You have a fever. °· You have chills. °· You have increased bleeding from the site. Hold pressure on the site. °Get help right away if: °· You have unusual pain at the site. °· You have redness, warmth, or swelling at the site. °· You have drainage (other than a small amount of blood on the dressing) from the site. °· The site is bleeding, and the bleeding does not stop after 30 minutes of holding steady pressure on the site. °· Your leg or foot becomes pale, cool, tingly, or numb. °This information is not intended to replace advice given to you by your health   care provider. Make sure you discuss any questions you have with your health care provider. °Document Released: 09/02/2013 Document Revised: 06/07/2015 Document Reviewed: 07/19/2013 °Elsevier Interactive Patient Education © 2018 [employer] ° °

## 2016-11-17 NOTE — Progress Notes (Signed)
Up and walked and tolerated well; right groin stable, no bleeding or hematoma 

## 2016-11-17 NOTE — Progress Notes (Addendum)
Site area: RFV X 3 Site Prior to Removal:  Level 0 Pressure Applied For:20 min Manual:   yes Patient Status During Pull:stable   Post Pull Site:  Level 0 Post Pull Instructions Given:  yes Post Pull Pulses Present: palpable Dressing Applied:  tegaderm Bedrest begins @ 1400 till 2000 Comments:

## 2016-11-17 NOTE — H&P (Signed)
Roberto [name] is a 51 y.o. male with a history of atrial flutter and systolic heart failure. He presents for atrial flutter ablation. On exam, RRR, no murmurs, lungs clear. ECG shows sinus rhythm. His INR is low today but has been in sinus rhythm and thus stroke risk is low. Risks and benefits discussed. Risks include but not limited to bleeding, tamponade, heart block, stroke, among others. He understands the risks and has agreed to the procedure.  [name], MD 11/17/2016 10:37 AM

## 2016-11-18 ENCOUNTER — Encounter (HOSPITAL_COMMUNITY): Payer: Self-pay | Admitting: Cardiology

## 2016-11-18 MED FILL — Fentanyl Citrate Preservative Free (PF) Inj 100 MCG/2ML: INTRAMUSCULAR | Qty: 2 | Status: AC

## 2016-11-18 MED FILL — Heparin Sodium (Porcine) 2 Unit/ML in Sodium Chloride 0.9%: INTRAMUSCULAR | Qty: 500 | Status: AC

## 2016-12-08 ENCOUNTER — Ambulatory Visit: Payer: BC Managed Care – PPO | Admitting: Medical

## 2016-12-08 ENCOUNTER — Encounter: Payer: Self-pay | Admitting: Medical

## 2016-12-08 VITALS — BP 128/82 | HR 75 | Temp 98.0°F | Wt 293.4 lb

## 2016-12-08 DIAGNOSIS — I8002 Phlebitis and thrombophlebitis of superficial vessels of left lower extremity: Secondary | ICD-10-CM | POA: Diagnosis not present

## 2016-12-08 DIAGNOSIS — Z7901 Long term (current) use of anticoagulants: Secondary | ICD-10-CM | POA: Diagnosis not present

## 2016-12-08 DIAGNOSIS — Z113 Encounter for screening for infections with a predominantly sexual mode of transmission: Secondary | ICD-10-CM | POA: Diagnosis not present

## 2016-12-08 DIAGNOSIS — Z1159 Encounter for screening for other viral diseases: Secondary | ICD-10-CM | POA: Diagnosis not present

## 2016-12-08 NOTE — Progress Notes (Signed)
Subjective: Chief Complaint  Patient presents with  . Leg Pain    leg pain ,  labs, std testing    Here for a few c/o.  Been having some discomfort in lower left leg.  Has chronic issues with this leg with swelling, pain , discoloration.    He notes 2 week hx/o discomfort in a vein in left lower leg anteriorly.  No recent trauma or injury.   Has been hot to touch and itchy on front of lower leg, can see red vein outline.    Wants STD screen.  Has new partner, and she wants him to be tested prior.  Last sexual encounter was with ex-wife a few years ago.  Needs refill on Contrave.   He gained a little weight with holiday meals, but contrave has been helping keep better control of weight.  Here for PT/INR lab for coumadin.  Has lump in left lower lip x 1 week, not painful, no prior similar.  Sees dentist in about 3 weeks.  Past Medical History:  Diagnosis Date  . Arthritis   . Chronic combined systolic and diastolic CHF (congestive heart failure) (HCC)   . Deep vein thrombosis (HCC)   . Lupus anticoagulant positive   . Morbid obesity (HCC)   . Mural thrombus of heart    coumadin  . Nonischemic cardiomyopathy (HCC)    a) 12/17/11 echo: LVEF 25-30%, grade 3 diastolic dysfunction (c/w restriction), mod MR, mod LA/LV and mild RA dilatation; b) 12/18/11 cMRI: LVEF 38%, mod LV/mild RV dilatation, global HK, mild-mod RV sys dysfxn, no LV thrombus & patchy non-subendocardial delayed enhancement c/w infil dz or prior myocarditis   . Paroxysmal SVT (supraventricular tachycardia) (HCC)   . Pulmonary embolism (HCC)    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   Current Outpatient Medications on File Prior to Visit  Medication Sig Dispense Refill  . acetaminophen (TYLENOL) 500 MG tablet Take 500-1,000 mg by mouth every 6 (six) hours as needed (for pain.).    [name] amiodarone (PACERONE) 200 MG tablet Take 1 tablet (200 mg total) by mouth 2 (two) times daily. 60 tablet 6  . CONTRAVE 8-90 MG TB12 TAKE 2  TABLETS BY MOUTH TWICE DAILY 360 tablet 0  . digoxin (DIGOX) 0.125 MG tablet TAKE 1 TABLET(0.125 MG) BY MOUTH DAILY 90 tablet 3  . furosemide (LASIX) 80 MG tablet Take 1 tablet (80 mg total) by mouth 2 (two) times daily. 60 tablet 5  . potassium chloride SA (K-DUR,KLOR-CON) 20 MEQ tablet Take 1 tablet (20 mEq total) by mouth 2 (two) times daily. 180 tablet 3  . spironolactone (ALDACTONE) 25 MG tablet Take 0.5 tablets (12.5 mg total) by mouth daily. 45 tablet 3  . warfarin (COUMADIN) 5 MG tablet Take 1 tablet (5 mg total) by mouth daily at 6 PM. Further per PCP (Patient taking differently: Take 5 mg by mouth See admin instructions. Take 1 tablet (5 mg) by mouth daily except on Sunday take no warfarin) 30 tablet 1   No current facility-administered medications on file prior to visit.    ROS as in subjective     Objective: BP 128/82   Pulse 75   Wt 293 lb 6.4 oz (133.1 kg)   SpO2 96%   BMI 44.61 kg/m   Wt Readings from Last 3 Encounters:  12/08/16 293 lb 6.4 oz (133.1 kg)  11/17/16 285 lb (129.3 kg)  11/05/16 288 lb (130.6 kg)   Gen: wd, wn, nad Heart: RRR,  normal s1, s2, no murmurs Lungs clear Ext: left lower leg at mid calf 21.5" diameter, compared to 18.5" diameter on right.  Left lower leg from 1/3 down to distal leg with purplish brown chronic coloration.  There are several palpable moderate varicose veins.   The area of tenderness is left lower anterior leg, just medial to midline with 6cm linear streaking with superficial palpable tender vein that seems inflamed or swollen.    No other palpable cord, no induration, no fluctuance.  No drainage.   Legs with normal sensation and strength 1+ pedal pulses.       Assessment: Encounter Diagnoses  Name Primary?  . Thrombophlebitis of superficial veins of left lower extremity Yes  . Encounter for hepatitis C screening test for low risk patient   . Screen for STD (sexually transmitted disease)   . Long term current use of  anticoagulant therapy     Plan: superficial Thrombophlebitis - advised warm compresses, leg elevation, relative rest.    discussed signs of cellulitis or worsening symptoms that would prompt recheck.   In general, begin compression stockings OTC.  Labs today for STD screen  PT/INR for surveillance of coumadin therapy.  Jomarie LongsJoseph was seen today for leg pain.  Diagnoses and all orders for this visit:  Thrombophlebitis of superficial veins of left lower extremity  Encounter for hepatitis C screening test for low risk patient -     HIV antibody -     RPR -     C. trachomatis/N. gonorrhoeae RNA -     Hepatitis C antibody -     Hepatitis B surface antigen  Screen for STD (sexually transmitted disease) -     HIV antibody -     RPR -     C. trachomatis/N. gonorrhoeae RNA -     Hepatitis C antibody -     Hepatitis B surface antigen  Long term current use of anticoagulant therapy -     Protime-INR

## 2016-12-09 ENCOUNTER — Other Ambulatory Visit: Payer: Self-pay | Admitting: Medical

## 2016-12-09 LAB — HEPATITIS B SURFACE ANTIGEN: HEP B S AG: NONREACTIVE

## 2016-12-09 LAB — PROTIME-INR
INR: 1.5 — ABNORMAL HIGH
Prothrombin Time: 15.9 s — ABNORMAL HIGH (ref 9.0–11.5)

## 2016-12-09 LAB — HEPATITIS C ANTIBODY
Hepatitis C Ab: NONREACTIVE
SIGNAL TO CUT-OFF: 0.07

## 2016-12-09 LAB — HIV ANTIBODY (ROUTINE TESTING W REFLEX): HIV 1&2 Ab, 4th Generation: NONREACTIVE

## 2016-12-09 LAB — C. TRACHOMATIS/N. GONORRHOEAE RNA
C. trachomatis RNA, TMA: NOT DETECTED
N. gonorrhoeae RNA, TMA: NOT DETECTED

## 2016-12-09 LAB — SYPHILIS: RPR W/REFLEX TO RPR TITER AND TREPONEMAL ANTIBODIES, TRADITIONAL SCREENING AND DIAGNOSIS ALGORITHM: RPR Ser Ql: NONREACTIVE

## 2016-12-09 MED ORDER — WARFARIN SODIUM 5 MG PO TABS: 5.0000 mg | ORAL_TABLET | ORAL | 5 refills | Status: DC

## 2016-12-09 MED ORDER — NALTREXONE-BUPROPION HCL ER 8-90 MG PO TB12: 2.0000 | ORAL_TABLET | Freq: Two times a day (BID) | ORAL | 3 refills | Status: DC

## 2016-12-09 MED ORDER — WARFARIN SODIUM 1 MG PO TABS: 1.0000 mg | ORAL_TABLET | Freq: Every day | ORAL | 5 refills | Status: DC

## 2016-12-12 ENCOUNTER — Encounter (HOSPITAL_COMMUNITY): Payer: Self-pay

## 2016-12-12 ENCOUNTER — Ambulatory Visit (HOSPITAL_COMMUNITY)
Admission: RE | Admit: 2016-12-12 | Discharge: 2016-12-12 | Disposition: A | Discharge status: Home / Self Care | Payer: BC Managed Care – PPO | Source: Ambulatory Visit | Attending: Cardiology | Admitting: Cardiology

## 2016-12-12 VITALS — BP 108/68 | HR 80 | Wt 289.0 lb

## 2016-12-12 DIAGNOSIS — R05 Cough: Secondary | ICD-10-CM | POA: Insufficient documentation

## 2016-12-12 DIAGNOSIS — Z86718 Personal history of other venous thrombosis and embolism: Secondary | ICD-10-CM | POA: Diagnosis not present

## 2016-12-12 DIAGNOSIS — I471 Supraventricular tachycardia: Secondary | ICD-10-CM | POA: Insufficient documentation

## 2016-12-12 DIAGNOSIS — G4733 Obstructive sleep apnea (adult) (pediatric): Secondary | ICD-10-CM | POA: Diagnosis not present

## 2016-12-12 DIAGNOSIS — I4891 Unspecified atrial fibrillation: Secondary | ICD-10-CM | POA: Diagnosis not present

## 2016-12-12 DIAGNOSIS — I4892 Unspecified atrial flutter: Secondary | ICD-10-CM | POA: Diagnosis not present

## 2016-12-12 DIAGNOSIS — I428 Other cardiomyopathies: Secondary | ICD-10-CM

## 2016-12-12 DIAGNOSIS — I5042 Chronic combined systolic (congestive) and diastolic (congestive) heart failure: Secondary | ICD-10-CM | POA: Insufficient documentation

## 2016-12-12 DIAGNOSIS — Z7901 Long term (current) use of anticoagulants: Secondary | ICD-10-CM | POA: Diagnosis not present

## 2016-12-12 DIAGNOSIS — N529 Male erectile dysfunction, unspecified: Secondary | ICD-10-CM

## 2016-12-12 DIAGNOSIS — Z86711 Personal history of pulmonary embolism: Secondary | ICD-10-CM | POA: Insufficient documentation

## 2016-12-12 DIAGNOSIS — I493 Ventricular premature depolarization: Secondary | ICD-10-CM | POA: Insufficient documentation

## 2016-12-12 DIAGNOSIS — I429 Cardiomyopathy, unspecified: Secondary | ICD-10-CM | POA: Diagnosis not present

## 2016-12-12 DIAGNOSIS — Z79899 Other long term (current) drug therapy: Secondary | ICD-10-CM | POA: Diagnosis not present

## 2016-12-12 DIAGNOSIS — I5022 Chronic systolic (congestive) heart failure: Secondary | ICD-10-CM

## 2016-12-12 MED ORDER — AMIODARONE HCL 200 MG PO TABS: 200.0000 mg | ORAL_TABLET | Freq: Every day | ORAL | 6 refills | Status: DC

## 2016-12-12 MED ORDER — SILDENAFIL CITRATE 25 MG PO TABS: 25.0000 mg | ORAL_TABLET | Freq: Every day | ORAL | 0 refills | Status: DC | PRN

## 2016-12-12 MED ORDER — LOSARTAN POTASSIUM 25 MG PO TABS: 25.0000 mg | ORAL_TABLET | Freq: Every day | ORAL | 6 refills | Status: DC

## 2016-12-12 NOTE — Addendum Note (Signed)
Encounter addended by: Graciella Freer, PA-C on: 12/12/2016 10:12 AM  Actions taken: Sign clinical note

## 2016-12-12 NOTE — Progress Notes (Addendum)
Advanced Heart Failure Clinic Note   Patient ID: Roberto [name], male   DOB: Nov 14, 1965, 51 y.o.   MRN: [id number] PCP: Dr Jacqulyn BathLong Cardiologist: Dr Jens Somrenshaw EP: Dr Johney FrameAllred Coumadin followed by PCP  HPI: Roberto [name] is a 51 yo AA male with h/o systolic HF due to NICM (EF25-30% 2013 ), h/o recurrent DVT with resultant PE and chronic venous insufficiency, h/o LV thrombus (on Coumadin), PSVT (elected for medical therapy over RFA on EP follow-up) and morbid obesity.   Ad[facility]16[facility]11 for recurrent HF. Diuresed 20 pounds with IV lasix. See RHC/LHC results   Admitted 10/14/16-10/21/16 with AF and tachy induced cardiomyopathy. EF dropped from 40%-> 15%. Underwent DCCV on 10/17/16 with successful conversion to NSR. Started on amiodarone. Diuresed 12 pounds with IV lasix. Discharge weight was 279 pounds. .   S/p AFL ablation 11/17/2016  He presents today for post AFL ablation follow up. EGK today shows NSR with occasional PVCs. Feeling great. Denies orthopnea or PND. 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 Denies BRBPR. No palpitations, CP, or dizziness. Has had a mild cough for the past week, has only tried mucinex.   EKG NSR 73 bpm with occasional PVCs (12/12/16)  Echo 10/15/16 - LVEF 15%  RHC 10/20/16  RA = 3 RV = 45/7 PA = 49/20 (31) PCW = 28 Fick cardiac output/index = 5.0/2.1 PVR = 0.5 WU Ao sat = 95% PA sat = 64%, 61%  12/17/11 ECHO 25-30%  05/20/2012 ECHO EF 40-45%  CARDIAC MRI - 12/18/11  1. Moderately dilated left ventricle with moderately decreased systolic function, EF 38%. Global hypokinesis.  2. Mildly dilated right ventricle with mild to moderately decreased systolic function.  3. No definite LV thrombus noted.  4. Patchy non-subendocardial delayed enhancement seen in the ventricular septum (see above for description). This is not suggestive of a coronary disease pattern. This could be suggestive of infiltrative  disease versus prior myocarditis.  RHC/LHC 12/22/11  Cors: Normal  SH: Lives with his wife and 5 children. Works full time running a Daycare  Review of systems complete and found to be negative unless listed in HPI.    Past Medical History:  Diagnosis Date  . Arthritis   . Chronic combined systolic and diastolic CHF (congestive heart failure) (HCC)   . Deep vein thrombosis (HCC)   . Lupus anticoagulant positive   . Morbid obesity (HCC)   . Mural thrombus of heart    coumadin  . Nonischemic cardiomyopathy (HCC)    a) 12/17/11 echo: LVEF 25-30%, grade 3 diastolic dysfunction (c/w restriction), mod MR, mod LA/LV and mild RA dilatation; b) 12/18/11 cMRI: LVEF 38%, mod LV/mild RV dilatation, global HK, mild-mod RV sys dysfxn, no LV thrombus & patchy non-subendocardial delayed enhancement c/w infil dz or prior myocarditis   . Paroxysmal SVT (supraventricular tachycardia) (HCC)   . Pulmonary embolism (HCC)    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    Current Outpatient Medications  Medication Sig Dispense Refill  . acetaminophen (TYLENOL) 500 MG tablet Take 500-1,000 mg by mouth every 6 (six) hours as needed (for pain.).    [name]. amiodarone (PACERONE) 200 MG tablet Take 1 tablet (200 mg total) by mouth 2 (two) times daily. 60 tablet 6  . digoxin (DIGOX) 0.125 MG tablet TAKE 1 TABLET(0.125 MG) BY MOUTH DAILY 90 tablet 3  . furosemide (LASIX) 80 MG tablet Take 1 tablet (80 mg total) by mouth 2 (  two) times daily. 60 tablet 5  . Naltrexone-Bupropion HCl ER (CONTRAVE) 8-90 MG TB12 Take 2 tablets by mouth 2 (two) times daily. 360 tablet 3  . potassium chloride SA (K-DUR,KLOR-CON) 20 MEQ tablet Take 1 tablet (20 mEq total) by mouth 2 (two) times daily. 180 tablet 3  . spironolactone (ALDACTONE) 25 MG tablet Take 0.5 tablets (12.5 mg total) by mouth daily. 45 tablet 3  . warfarin (COUMADIN) 1 MG tablet Take 1 tablet (1 mg total) by mouth daily. 30 tablet 5  . warfarin (COUMADIN) 5 MG tablet Take 1 tablet  (5 mg total) by mouth See admin instructions. 30 tablet 5   No current facility-administered medications for this encounter.    PHYSICAL EXAM: Vitals:   12/12/16 0917  BP: 108/68  Pulse: 80  SpO2: 100%  Weight: 289 lb (131.1 kg)   Wt Readings from Last 3 Encounters:  12/12/16 289 lb (131.1 kg)  12/08/16 293 lb 6.4 oz (133.1 kg)  11/17/16 285 lb (129.3 kg)    General: Sitting in chair. No resp difficulty. HEENT: Normal Neck: Supple. JVP 5-6. Carotids 2+ bilat; no bruits. No thyromegaly or nodule noted. Cor: PMI nondisplaced. RRR, No M/G/R noted Lungs: CTAB, normal effort. Abdomen: Soft, non-tender, non-distended, no HSM. No bruits or masses. +BS  Extremities: No cyanosis, clubbing, or rash. R and LLE no edema. Warm Neuro: Alert & orientedx3, cranial nerves grossly intact. moves all 4 extremities w/o difficulty. Affect pleasant./   EKG NSR 73 bpm with occasional PVCs (12/12/16)  ASSESSMENT & PLAN:  1. Chronic Systolic Heart Failure: Recent Echo EF 15%, drop from 40-45% in 2016, felt to be related to Afib.  - Echo 10/15/16 - LVEF 15% - NYHA II symptoms in NSR - Volume status stable on exam - Continue spiro 12.5 mg daily - Continue digoxin 0.125 mg daily. - No b-blocker with low output/recent decompensation - Will add losartan 25 mg qhs. BMET 10 days. - Reinforced fluid restriction to < 2 L daily, sodium restriction to less than 2000 mg daily, and the importance of daily weights.    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 - remains in NSR today.  - Discussed with EP. Decreased amiodarone to 200 mg daily.  - Continue Xarelto. - Will need sleep study.   3. DVT/PE - Continue Xarelto. Followed by Dr Jacqulyn Bath  4. Morbid Obestiy  - Stressed importance of weight loss.  5. ED - OK to use 25 mg sildenafil as needed.  Not on any long acting nitrates.   Recheck Echo 2-3 months with med titration and AFL ablation.   Graciella Freer, PA-C  9:32 AM   Greater than 50% of  the 25 minute visit was spent in counseling/coordination of care regarding disease state education, salt/fluid restriction, sliding scale diuretics, and medication compliance.

## 2016-12-12 NOTE — Patient Instructions (Addendum)
DECREASE Amiodarone to 200 mg tablet ONCE daily.  START Losartan 25 mg tablet once daily at bedtime.  START Sildenafil (Viagra) 25 mg tablet once daily as needed for erectile dysfunction.  Return in 10 days for lab work.  Follow up 3 months with echocardiogram and appointment with Dr. Gala Romney.  Take all medication as prescribed the day of your appointment. Bring all medications with you to your appointment.  Do the following things EVERYDAY: 1) Weigh yourself in the morning before breakfast. Write it down and keep it in a log. 2) Take your medicines as prescribed 3) Eat low salt foods-Limit salt (sodium) to 2000 mg per day.  4) Stay as active as you can everyday 5) Limit all fluids for the day to less than 2 liters

## 2016-12-12 NOTE — Progress Notes (Signed)
Advanced Heart Failure Medication Review by a Pharmacist  Does the patient  feel that his/her medications are working for him/her?  yes  Has the patient been experiencing any side effects to the medications prescribed?  no  Does the patient measure his/her own blood pressure or blood glucose at home?  Not asked at this visit    Does the patient have any problems obtaining medications due to transportation or finances?   no  Understanding of regimen: good Understanding of indications: good Potential of compliance: good Patient understands to avoid NSAIDs. Patient understands to avoid decongestants.  Issues to address at subsequent visits: None    Pharmacist comments: Roberto [name] is a 51 year old male presenting to clinic today without a medication list or bottles. He reports taking his doses of medication today and adherence to his current regimen. He does not have any medication related questions or concerns at this time.    Time with patient: 10 Preparation and documentation time: 2 Total time: 12

## 2016-12-15 ENCOUNTER — Telehealth (HOSPITAL_COMMUNITY): Payer: Self-pay

## 2016-12-15 NOTE — Telephone Encounter (Signed)
Called and spoke with patient in regards to Cardiac Rehab - Patient is currently exercising on his own while working two jobs. He asked for me to mail brochure. He would also like to come take a look at the facility and to follow up with patient at the beginning of the year.

## 2016-12-23 ENCOUNTER — Telehealth (HOSPITAL_COMMUNITY): Payer: Self-pay

## 2016-12-23 ENCOUNTER — Other Ambulatory Visit (HOSPITAL_COMMUNITY): Payer: BC Managed Care – PPO

## 2016-12-23 NOTE — Telephone Encounter (Signed)
Patient rescheduled todays 10 day BMET to Friday due to weather and conflicts in work schedule.  Ave Filter, RN

## 2016-12-26 ENCOUNTER — Ambulatory Visit (HOSPITAL_COMMUNITY)
Admission: RE | Admit: 2016-12-26 | Discharge: 2016-12-26 | Disposition: A | Discharge status: Home / Self Care | Payer: BC Managed Care – PPO | Source: Ambulatory Visit | Attending: Cardiology | Admitting: Cardiology

## 2016-12-26 DIAGNOSIS — I5022 Chronic systolic (congestive) heart failure: Secondary | ICD-10-CM | POA: Insufficient documentation

## 2016-12-26 LAB — BASIC METABOLIC PANEL
ANION GAP: 6 (ref 5–15)
BUN: 15 mg/dL (ref 6–20)
CALCIUM: 8.7 mg/dL — AB (ref 8.9–10.3)
CO2: 31 mmol/L (ref 22–32)
Chloride: 100 mmol/L — ABNORMAL LOW (ref 101–111)
Creatinine, Ser: 1.11 mg/dL (ref 0.61–1.24)
GFR calc Af Amer: >60 mL/min (ref >60)
GLUCOSE: 109 mg/dL — AB (ref 65–99)
POTASSIUM: 4 mmol/L (ref 3.5–5.1)
SODIUM: 137 mmol/L (ref 135–145)

## 2016-12-30 ENCOUNTER — Telehealth: Payer: Self-pay | Admitting: Cardiology

## 2016-12-30 NOTE — Telephone Encounter (Signed)
New message      Patient states he already had a follow up appt at the hospital  and does not need this appt, please advise

## 2016-12-30 NOTE — Telephone Encounter (Signed)
Advised patient he needed to keep his 4 week post ablation follow up w/ Dr. Elberta Fortis and explained why. Patient verbalized understanding and agreeable to keeping appt tomorrow.

## 2016-12-31 ENCOUNTER — Encounter: Payer: Self-pay | Admitting: Cardiology

## 2016-12-31 ENCOUNTER — Ambulatory Visit (INDEPENDENT_AMBULATORY_CARE_PROVIDER_SITE_OTHER): Payer: BC Managed Care – PPO | Admitting: Cardiology

## 2016-12-31 VITALS — BP 110/70 | HR 71 | Ht 68.0 in | Wt 303.1 lb

## 2016-12-31 DIAGNOSIS — I4892 Unspecified atrial flutter: Secondary | ICD-10-CM | POA: Diagnosis not present

## 2016-12-31 DIAGNOSIS — I428 Other cardiomyopathies: Secondary | ICD-10-CM

## 2016-12-31 NOTE — Progress Notes (Signed)
n

## 2016-12-31 NOTE — Patient Instructions (Addendum)
Medication Instructions:  Your physician has recommended you make the following change in your medication:  1. STOP Amiodarone  * If you need a refill on your cardiac medications before your next appointment, please call your pharmacy. *  Labwork: None ordered  Testing/Procedures: None ordered  Follow-Up: No follow up is needed at this time with Dr. Elberta Fortis.  He will see you on an as needed basis.  Thank you for choosing [facility]!!   Dory Horn, RN [phone]

## 2016-12-31 NOTE — Progress Notes (Signed)
Electrophysiology Office Note   Date:  12/31/2016   ID:  Roberto [name], DOB May 27, 1965, MRN [id number]  PCP:  Jac Canavan, PA-C  Cardiologist:  Bensimhon Primary Electrophysiologist:  Keniya Schlotterbeck Jorja Loa, MD    Chief Complaint  Patient presents with  . Follow-up    AFlutter/Nonischemic cardiomyopathy     History of Present Illness: Roberto [name] is a 51 y.o. male who is being seen today for the evaluation of atrial flutter at the request of Jac Canavan, PA-C. Presenting today for electrophysiology evaluation. 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 He was admitted on 10/14/16 with atrial fibrillation and tachycardia-induced myopathy. His EF dropped from 40% to 15%. He underwent cardioversion to sinus rhythm and was placed on amiodarone. He was diuresed 12 pounds with a discharge weight of 279 pounds. He returned to her failure clinic in atrial flutter with a rate of 115.  And atrial flutter ablation on 11/5/  Today, denies symptoms of palpitations, chest pain, shortness of breath, orthopnea, PND, lower extremity edema, claudication, dizziness, presyncope, syncope, bleeding, or neurologic sequela. The patient is tolerating medications without difficulties.  Is currently feeling well without major complaint.  He does not have much in the way of shortness of breath or fatigue.  He has had no further issues with tachyarrhythmias since his atrial flutter ablation.   Past Medical History:  Diagnosis Date  . Arthritis   . Chronic combined systolic and diastolic CHF (congestive heart failure) (HCC)   . Deep vein thrombosis (HCC)   . Lupus anticoagulant positive   . Morbid obesity (HCC)   . Mural thrombus of heart    coumadin  . Nonischemic cardiomyopathy (HCC)    a) 12/17/11 echo: LVEF 25-30%, grade 3 diastolic dysfunction  (c/w restriction), mod MR, mod LA/LV and mild RA dilatation; b) 12/18/11 cMRI: LVEF 38%, mod LV/mild RV dilatation, global HK, mild-mod RV sys dysfxn, no LV thrombus & patchy non-subendocardial delayed enhancement c/w infil dz or prior myocarditis   . Paroxysmal SVT (supraventricular tachycardia) (HCC)   . Pulmonary embolism (HCC)    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   Past Surgical History:  Procedure Laterality Date  . A-FLUTTER ABLATION N/A 11/17/2016   Procedure: A-FLUTTER ABLATION;  Surgeon: Regan Lemming, MD;  Location: [facility] INVASIVE CV LAB;  Service: Cardiovascular;  Laterality: N/A;  . CARDIAC CATHETERIZATION  06/2006   Angiographically normal cors  . CARDIAC CATHETERIZATION  12/22/2011   R/LHC: normal cors, well-compensated HDs, LV dysfxn  . CARDIOVERSION N/A 10/17/2016   Procedure: CARDIOVERSION;  Surgeon: Dolores Patty, MD;  Location: [facility] ENDOSCOPY;  Service: Cardiovascular;  Laterality: N/A;  . LEFT AND RIGHT HEART CATHETERIZATION WITH CORONARY ANGIOGRAM N/A 12/22/2011   Procedure: LEFT AND RIGHT HEART CATHETERIZATION WITH CORONARY ANGIOGRAM;  Surgeon: Dolores Patty, MD;  Location[facility] C[facility] LAB;  Service: Cardiovascular;  Laterality: N/A;  . PATELLAR TENDON REPAIR     Left  . RIGHT HEART CATH N/A 10/20/2016   Procedure: RIGHT HEART CATH;  Surgeon: Dolores Patty, MD;  Location: [facility] INVASIVE CV LAB;  Service: Cardiovascular;  Laterality: N/A;     Current Outpatient Medications  Medication Sig Dispense Refill  . acetaminophen (TYLENOL) 500 MG tablet Take 500-1,000 mg by mouth every 6 (six) hours as needed (for pain.).    [name] amiodarone (PACERONE) 200 MG  tablet Take 1 tablet (200 mg total) by mouth daily. 30 tablet 6  . digoxin (DIGOX) 0.125 MG tablet TAKE 1 TABLET(0.125 MG) BY MOUTH DAILY 90 tablet 3  . furosemide (LASIX) 80 MG tablet Take 1 tablet (80 mg total) by mouth 2 (two) times daily. 60 tablet 5  . losartan (COZAAR) 25 MG tablet Take 1 tablet (25 mg  total) by mouth at bedtime. 30 tablet 6  . Naltrexone-Bupropion HCl ER (CONTRAVE) 8-90 MG TB12 Take 2 tablets by mouth 2 (two) times daily. 360 tablet 3  . potassium chloride SA (K-DUR,KLOR-CON) 20 MEQ tablet Take 1 tablet (20 mEq total) by mouth 2 (two) times daily. 180 tablet 3  . sildenafil (VIAGRA) 25 MG tablet Take 1 tablet (25 mg total) by mouth daily as needed for erectile dysfunction. 10 tablet 0  . spironolactone (ALDACTONE) 25 MG tablet Take 0.5 tablets (12.5 mg total) by mouth daily. 45 tablet 3  . warfarin (COUMADIN) 1 MG tablet Take 1 tablet (1 mg total) by mouth daily. 30 tablet 5  . warfarin (COUMADIN) 5 MG tablet Take 1 tablet (5 mg total) by mouth See admin instructions. 30 tablet 5   No current facility-administered medications for this visit.     Allergies:   Shrimp [shellfish allergy]   Social History:  The patient  reports that  has never smoked. he has never used smokeless tobacco. He reports that he does not drink alcohol or use drugs.   Family History:  [name] family history includes Cancer in his mother; Clotting disorder in his mother; Diabetes in his father, other, and sister; Heart attack in his father; Hypertension in his mother and other; Obesity in his other, sister, and sister.   ROS:  Please see the history of present illness.   Otherwise, review of systems is positive for none.   All other systems are reviewed and negative.   PHYSICAL EXAM: VS:  BP 110/70   Pulse 71   Ht 5\' 8"  (1.727 m)   Wt (!) 303 lb 1.9 oz (137.5 kg)   BMI 46.09 kg/m  , BMI Body mass index is 46.09 kg/m. GEN: Well nourished, well developed, in no acute distress  HEENT: normal  Neck: no JVD, carotid bruits, or masses Cardiac: RRR; no murmurs, rubs, or gallops,no edema  Respiratory:  clear to auscultation bilaterally, normal work of breathing GI: soft, nontender, nondistended, + BS MS: no deformity or atrophy  Skin: warm and dry Neuro:  Strength and sensation are  intact Psych: euthymic mood, full affect  EKG:  EKG is ordered today. Personal review of the ekg ordered shows SR, rate 71, biatrial enlargement, prolonged QTc   Recent Labs: 10/14/2016: B Natriuretic Peptide 210.2; TSH 1.946 10/20/2016: Magnesium 1.7 10/31/2016: ALT 17 11/05/2016: Hemoglobin 14.1; Platelets 178 12/26/2016: BUN 15; Creatinine, Ser 1.11; Potassium 4.0; Sodium 137    Lipid Panel     Component Value Date/Time   CHOL 107 (L) 09/18/2015 1013   TRIG 68 09/18/2015 1013   HDL 40 09/18/2015 1013   CHOLHDL 2.7 09/18/2015 1013   VLDL 14 09/18/2015 1013   LDLCALC 53 09/18/2015 1013     Wt Readings from Last 3 Encounters:  12/12/16 289 lb (131.1 kg)  12/08/16 293 lb 6.4 oz (133.1 kg)  11/17/16 285 lb (129.3 kg)      Other studies Reviewed: Additional studies/ records that were reviewed today include: TTE 10/15/16  Review of the above records today demonstrates:  - Left ventricle: The cavity  size was moderately dilated. Wall   thickness was normal. The estimated ejection fraction was 15%.   Diffuse hypokinesis. Indeterminant diastolic function, appears to   be in atrial flutter. No LV thrombus noted. - Aortic valve: Poorly visualized. Probably trileaflet; mildly   calcified leaflets. There was no stenosis. - Mitral valve: There was no significant regurgitation. - Left atrium: The atrium was mildly to moderately dilated. - Right ventricle: Poorly visualized. The cavity size was   moderately dilated. Systolic function was moderately reduced. - Right atrium: The atrium was moderately dilated. - Pulmonary arteries: No complete TR doppler jet so unable to   estimate PA systolic pressure. - Systemic veins: IVC measured 3 cm with < 50% respirophasic   variation, suggesting RA pressure 15 mmHg.    ASSESSMENT AND PLAN:  1.  Chronic systolic heart failure: Recent EF dropped to 15% thought related to tachyarrhythmias.  Currently on losartan.  Has repeat echo planned for  February.  2. Atrial flutter: Only on Coumadin.  Had ablation for atrial flutter 11/17/16.  As it is been greater than 1 month since ablation occurred, would be able to come off of Coumadin, though he is currently being treated for DVTs.  Quita Mcgrory let his primary physician determine his need for Coumadin long-term.  We Rhona Fusilier stop his amiodarone at this time as he had an ablation for his atrial flutter.  This patients CHA2DS2-VASc Score and unadjusted Ischemic Stroke Rate (% per year) is equal to 0.2 % stroke rate/year from a score of 0  Above score calculated as 1 point each if present [CHF, HTN, DM, Vascular=MI/PAD/Aortic Plaque, Age if 65-74, or Male] Above score calculated as 2 points each if present [Age > 75, or Stroke/TIA/TE]    3. Morbid obesity: Weight loss encouraged  4. DVT/PE: On Coumadin per Dr. Jacqulyn Bath.  At this point can come off her atrial flutter.    Current medicines are reviewed at length with the patient today.   The patient does not have concerns regarding his medicines.  The following changes were made today:  Stop amiodarone  Labs/ tests ordered today include:  No orders of the defined types were placed in this encounter.    Disposition:   FU with Michaelia Beilfuss PRN  Signed, Tarea Skillman Jorja Loa, MD  12/31/2016 3:52 PM     [facility] [address] [place] [address] [phone] (office) [phone] (fax)

## 2017-01-14 ENCOUNTER — Telehealth (HOSPITAL_COMMUNITY): Payer: Self-pay

## 2017-01-14 ENCOUNTER — Encounter: Payer: Self-pay | Admitting: Medical

## 2017-01-14 ENCOUNTER — Ambulatory Visit: Payer: BC Managed Care – PPO | Admitting: Medical

## 2017-01-14 VITALS — BP 114/78 | HR 80 | Wt 309.4 lb

## 2017-01-14 DIAGNOSIS — N529 Male erectile dysfunction, unspecified: Secondary | ICD-10-CM | POA: Diagnosis not present

## 2017-01-14 DIAGNOSIS — I428 Other cardiomyopathies: Secondary | ICD-10-CM

## 2017-01-14 DIAGNOSIS — Z7901 Long term (current) use of anticoagulants: Secondary | ICD-10-CM

## 2017-01-14 DIAGNOSIS — I5022 Chronic systolic (congestive) heart failure: Secondary | ICD-10-CM | POA: Diagnosis not present

## 2017-01-14 DIAGNOSIS — R7301 Impaired fasting glucose: Secondary | ICD-10-CM | POA: Diagnosis not present

## 2017-01-14 MED ORDER — SILDENAFIL CITRATE 100 MG PO TABS: 50.0000 mg | ORAL_TABLET | Freq: Every day | ORAL | 0 refills | Status: DC | PRN

## 2017-01-14 NOTE — Telephone Encounter (Signed)
Patient returned phone call and stated he is working out on his own. Due to his work schedule he does not believe he will be able to make time to participate in program. Closed referral.

## 2017-01-14 NOTE — Telephone Encounter (Signed)
Attempted to call patient in regards to cardiac rehab - son answered and stated patient is not at home right now. Will return call.

## 2017-01-14 NOTE — Progress Notes (Signed)
Subjective: Chief Complaint  Patient presents with  . other    med. questions   Here for concerns about ED.   He reports problems with erectile dysfunction x few years, but hasn't been sexually active the past year.  recently has new girlfriend, wants to be intimate.   Can get erection but can't keep the erection.  In the past some of his medications were causing him problems getting erections.   He recently saw cardiology and his cardiac ablation doctors, 2 separate visits and discussed ED.  He reports his cardiologist didn't have any objections to using Cialis or testosterone.  Gets occasional morning erections.   Recently tried Viagra, per Dr. Prescott Gum assistant.  He notes not a real improvement with this trial.  He tried 25mg  without much improvement.  Tried this several times.  No prior eval for low testosterone.  He denies libido issues and denies significant fatigue.  No hx/o BPH or prostate problems.  No prior other treatments.    Past Medical History:  Diagnosis Date  . Arthritis   . Chronic combined systolic and diastolic CHF (congestive heart failure) (HCC)   . Deep vein thrombosis (HCC)   . Lupus anticoagulant positive   . Morbid obesity (HCC)   . Mural thrombus of heart    coumadin  . Nonischemic cardiomyopathy (HCC)    a) 12/17/11 echo: LVEF 25-30%, grade 3 diastolic dysfunction (c/w restriction), mod MR, mod LA/LV and mild RA dilatation; b) 12/18/11 cMRI: LVEF 38%, mod LV/mild RV dilatation, global HK, mild-mod RV sys dysfxn, no LV thrombus & patchy non-subendocardial delayed enhancement c/w infil dz or prior myocarditis   . Paroxysmal SVT (supraventricular tachycardia) (HCC)   . Pulmonary embolism (HCC)    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   Current Outpatient Medications on File Prior to Visit  Medication Sig Dispense Refill  . acetaminophen (TYLENOL) 500 MG tablet Take 500-1,000 mg by mouth every 6 (six) hours as needed (for pain.).    [name] digoxin (DIGOX) 0.125 MG  tablet TAKE 1 TABLET(0.125 MG) BY MOUTH DAILY 90 tablet 3  . furosemide (LASIX) 80 MG tablet Take 1 tablet (80 mg total) by mouth 2 (two) times daily. 60 tablet 5  . losartan (COZAAR) 25 MG tablet Take 1 tablet (25 mg total) by mouth at bedtime. 30 tablet 6  . Naltrexone-Bupropion HCl ER (CONTRAVE) 8-90 MG TB12 Take 2 tablets by mouth 2 (two) times daily. 360 tablet 3  . potassium chloride SA (K-DUR,KLOR-CON) 20 MEQ tablet Take 1 tablet (20 mEq total) by mouth 2 (two) times daily. 180 tablet 3  . sildenafil (VIAGRA) 25 MG tablet Take 1 tablet (25 mg total) by mouth daily as needed for erectile dysfunction. 10 tablet 0  . spironolactone (ALDACTONE) 25 MG tablet Take 0.5 tablets (12.5 mg total) by mouth daily. 45 tablet 3  . warfarin (COUMADIN) 1 MG tablet Take 1 tablet (1 mg total) by mouth daily. 30 tablet 5  . warfarin (COUMADIN) 5 MG tablet Take 1 tablet (5 mg total) by mouth See admin instructions. 30 tablet 5   No current facility-administered medications on file prior to visit.     Objective: BP 114/78   Pulse 80   Wt (!) 309 lb 6.4 oz (140.3 kg)   SpO2 97%   BMI 47.04 kg/m   Wt Readings from Last 3 Encounters:  01/14/17 (!) 309 lb 6.4 oz (140.3 kg)  12/31/16 (!) 303 lb 1.9 oz (137.5 kg)  12/12/16 289  lb (131.1 kg)   Gen: wd, wn, nad Lungs clear Heart rrr, normal s1, s2, no murmurs Ext: bilat chronically with lipidemia, but no specific swelling, left lower leg from 1/3 down to distal leg with purplish brown chronic coloration.  There are several palpable moderate varicose veins.  no palpable cord, no induration, no fluctuance.  No drainage.   Legs with normal sensation and strength 1+ pedal pulses    Assessment: Encounter Diagnoses  Name Primary?  . Erectile dysfunction, unspecified erectile dysfunction type Yes  . Impaired fasting blood sugar   . Long term current use of anticoagulant therapy   . Chronic systolic heart failure (HCC)   . Nonischemic cardiomyopathy (HCC)       Plan: We discussed ED, possible causes, possible evaluation and treatment.   His ED is likely multifactorial with known heart disease, medications that can contribute to ED, impaired glucose, and obesity.   We will check testosterone level today.  I reviewed his recent notes from electrophysiologist, and I note in chart where cardiology did write for Viagra 25mg  recently.  I will touch base with cardiology to make sure they feel he is safe for higher doses.  Per Mr. Swor, he and cardiologist discussed ED and medications and was given clearance.   I don't have his recent cardiology note, but advised if in fact cardiology gives him clearance we can try Viagra 50mg  next, and possibly go up to 100mg  if 50mg  proves to be ineffective.   discussed other treatment options.  discussed next steps if testosterone is low.   discussed risks/benefits of Viagra and similar medication.    Await labs and feedback from cardiology.  He is to return soon for yearly physical and fasting labs as well.  He c/t Coumadin for recurrent DVT, prior PE  Kaseem was seen today for other.  Diagnoses and all orders for this visit:  Erectile dysfunction, unspecified erectile dysfunction type -     Testosterone  Impaired fasting blood sugar  Long term current use of anticoagulant therapy  Chronic systolic heart failure (HCC)  Nonischemic cardiomyopathy (HCC)  Other orders -     sildenafil (VIAGRA) 100 MG tablet; Take 0.5-1 tablets (50-100 mg total) by mouth daily as needed for erectile dysfunction.

## 2017-01-15 ENCOUNTER — Other Ambulatory Visit: Payer: Self-pay | Admitting: Medical

## 2017-01-15 DIAGNOSIS — R7989 Other specified abnormal findings of blood chemistry: Secondary | ICD-10-CM

## 2017-01-15 DIAGNOSIS — N529 Male erectile dysfunction, unspecified: Secondary | ICD-10-CM

## 2017-01-15 DIAGNOSIS — E291 Testicular hypofunction: Secondary | ICD-10-CM

## 2017-01-15 LAB — TESTOSTERONE: TESTOSTERONE: 170 ng/dL — AB (ref 250–827)

## 2017-01-16 ENCOUNTER — Other Ambulatory Visit: Payer: BC Managed Care – PPO

## 2017-01-16 DIAGNOSIS — E291 Testicular hypofunction: Secondary | ICD-10-CM

## 2017-01-16 DIAGNOSIS — N529 Male erectile dysfunction, unspecified: Secondary | ICD-10-CM

## 2017-01-16 DIAGNOSIS — R7989 Other specified abnormal findings of blood chemistry: Secondary | ICD-10-CM

## 2017-01-17 LAB — TESTOSTERONE: Testosterone: 206 ng/dL — ABNORMAL LOW (ref 250–827)

## 2017-01-17 LAB — FSH/LH
FSH: 10.4 m[IU]/mL — ABNORMAL HIGH (ref 1.6–8.0)
LH: 7 m[IU]/mL (ref 1.5–9.3)

## 2017-01-17 LAB — PROLACTIN: Prolactin: 8.1 ng/mL (ref 2.0–18.0)

## 2017-01-17 LAB — PSA: PSA: 0.7 ng/mL (ref <=4.0)

## 2017-01-17 LAB — TSH: TSH: 1.44 m[IU]/L (ref 0.40–4.50)

## 2017-01-19 ENCOUNTER — Other Ambulatory Visit: Payer: Self-pay | Admitting: Medical

## 2017-01-19 MED ORDER — TESTOSTERONE 20.25 MG/1.25GM (1.62%) TD GEL: 2.0000 | Freq: Every day | TRANSDERMAL | 2 refills | Status: DC

## 2017-01-20 ENCOUNTER — Other Ambulatory Visit: Payer: Self-pay

## 2017-01-25 ENCOUNTER — Telehealth: Payer: Self-pay | Admitting: Medical

## 2017-01-25 NOTE — Telephone Encounter (Signed)
P.A. TESTOSTERONE °

## 2017-02-07 NOTE — Telephone Encounter (Signed)
P.A. Approved til 01/26/20, pt informed, faxed pharmacy

## 2017-03-04 ENCOUNTER — Ambulatory Visit (HOSPITAL_COMMUNITY): Payer: BC Managed Care – PPO

## 2017-03-04 ENCOUNTER — Encounter (HOSPITAL_COMMUNITY): Payer: BC Managed Care – PPO | Admitting: Internal Medicine

## 2017-03-08 ENCOUNTER — Other Ambulatory Visit: Payer: Self-pay | Admitting: Medical

## 2017-03-09 ENCOUNTER — Telehealth: Payer: Self-pay | Admitting: Medical

## 2017-03-09 NOTE — Telephone Encounter (Signed)
Pt called regarding refill Furosemide and I advised he should have refills left filled by Dr. Gala Romney , he states he ran out,  I called pharmacy and spoke with Randa Evens and she said he got filled on 2/8 and has refills.  Called pt back & stated he didn't get #60 in his bottle.  I called pharmacist back and she will call pt and straighten this out if they shorted him pills.  Called pt and informed

## 2017-03-13 ENCOUNTER — Ambulatory Visit: Payer: BC Managed Care – PPO | Admitting: Medical

## 2017-03-13 ENCOUNTER — Encounter: Payer: Self-pay | Admitting: Medical

## 2017-03-13 ENCOUNTER — Telehealth: Payer: Self-pay | Admitting: Medical

## 2017-03-13 ENCOUNTER — Other Ambulatory Visit: Payer: Self-pay | Admitting: Medical

## 2017-03-13 VITALS — BP 112/76 | HR 85 | Wt 295.8 lb

## 2017-03-13 DIAGNOSIS — I872 Venous insufficiency (chronic) (peripheral): Secondary | ICD-10-CM | POA: Diagnosis not present

## 2017-03-13 DIAGNOSIS — G8929 Other chronic pain: Secondary | ICD-10-CM | POA: Diagnosis not present

## 2017-03-13 DIAGNOSIS — M25561 Pain in right knee: Secondary | ICD-10-CM | POA: Diagnosis not present

## 2017-03-13 DIAGNOSIS — Z7901 Long term (current) use of anticoagulants: Secondary | ICD-10-CM | POA: Diagnosis not present

## 2017-03-13 DIAGNOSIS — M21961 Unspecified acquired deformity of right lower leg: Secondary | ICD-10-CM

## 2017-03-13 DIAGNOSIS — I5022 Chronic systolic (congestive) heart failure: Secondary | ICD-10-CM | POA: Diagnosis not present

## 2017-03-13 DIAGNOSIS — R269 Unspecified abnormalities of gait and mobility: Secondary | ICD-10-CM | POA: Diagnosis not present

## 2017-03-13 DIAGNOSIS — M1711 Unilateral primary osteoarthritis, right knee: Secondary | ICD-10-CM

## 2017-03-13 LAB — PROTIME-INR
INR: 1.3 — ABNORMAL HIGH (ref 0.8–1.2)
PROTHROMBIN TIME: 14.4 s — AB (ref 9.1–12.0)

## 2017-03-13 MED ORDER — WARFARIN SODIUM 2 MG PO TABS: 2.0000 mg | ORAL_TABLET | Freq: Every day | ORAL | 0 refills | Status: DC

## 2017-03-13 NOTE — Progress Notes (Signed)
Subjective: Chief Complaint  Patient presents with  . Knee Pain    knee pain right , wants a referral to ortho. , pt and inr    Here for right knee pain.  Been hurting for years, but has been more active in last year with exercise. No swelling.  Hurts to stand, stiff, hurts worse bending.  Pain is intermittent.  Sometimes exercising it wont hurt.  Hurts worse when not exercising.   Has hx/o left patellar repair, but no other knee surgery.     Also here for PT /INR lab.   Past Medical History:  Diagnosis Date  . Arthritis   . Chronic combined systolic and diastolic CHF (congestive heart failure) (HCC)   . Deep vein thrombosis (HCC)   . Lupus anticoagulant positive   . Morbid obesity (HCC)   . Mural thrombus of heart    coumadin  . Nonischemic cardiomyopathy (HCC)    a) 12/17/11 echo: LVEF 25-30%, grade 3 diastolic dysfunction (c/w restriction), mod MR, mod LA/LV and mild RA dilatation; b) 12/18/11 cMRI: LVEF 38%, mod LV/mild RV dilatation, global HK, mild-mod RV sys dysfxn, no LV thrombus & patchy non-subendocardial delayed enhancement c/w infil dz or prior myocarditis   . Paroxysmal SVT (supraventricular tachycardia) (HCC)   . Pulmonary embolism (HCC)    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   Current Outpatient Medications on File Prior to Visit  Medication Sig Dispense Refill  . acetaminophen (TYLENOL) 500 MG tablet Take 500-1,000 mg by mouth every 6 (six) hours as needed (for pain.).    [name] digoxin (DIGOX) 0.125 MG tablet TAKE 1 TABLET(0.125 MG) BY MOUTH DAILY 90 tablet 3  . furosemide (LASIX) 80 MG tablet Take 1 tablet (80 mg total) by mouth 2 (two) times daily. 60 tablet 5  . Naltrexone-Bupropion HCl ER (CONTRAVE) 8-90 MG TB12 Take 2 tablets by mouth 2 (two) times daily. 360 tablet 3  . potassium chloride SA (K-DUR,KLOR-CON) 20 MEQ tablet Take 1 tablet (20 mEq total) by mouth 2 (two) times daily. 180 tablet 3  . sildenafil (VIAGRA) 100 MG tablet Take 0.5-1 tablets (50-100 mg total)  by mouth daily as needed for erectile dysfunction. 10 tablet 0  . spironolactone (ALDACTONE) 25 MG tablet Take 0.5 tablets (12.5 mg total) by mouth daily. 45 tablet 3  . Testosterone (ANDROGEL) 20.25 MG/1.25GM (1.62%) GEL Place 2 Squirts onto the skin daily. 1.25 g 2  . warfarin (COUMADIN) 1 MG tablet Take 1 tablet (1 mg total) by mouth daily. 30 tablet 5  . warfarin (COUMADIN) 5 MG tablet Take 1 tablet (5 mg total) by mouth See admin instructions. 30 tablet 5  . losartan (COZAAR) 25 MG tablet Take 1 tablet (25 mg total) by mouth at bedtime. 30 tablet 6   No current facility-administered medications on file prior to visit.    ROS as in subjective    Objective BP 112/76   Pulse 85   Wt 295 lb 12.8 oz (134.2 kg)   SpO2 96%   BMI 44.98 kg/m   Gen: wd, wn, nad Skin: somewhat faint darker coloration of lower extremities c/w venous insufficiency He has a chronic in bowing of his right knee causing a gait deformity Tender along the anterior medial and lateral joint line, pain with flexion in general, pain with Mcmurray but no clicking, tender MCL region, left leg nontender, otherwise legs non tender and no other obvious deformity No obvious laxity, no swelling LE 1+ pulses, mild chronic 1+ nonpitting  edema Normal leg strength, sensation   Assessment: Encounter Diagnoses  Name Primary?  . Chronic pain of right knee Yes  . Knee deformity, acquired, right   . Gait disturbance   . Chronic systolic heart failure (HCC)   . Venous insufficiency   . Osteoarthritis of right knee, unspecified osteoarthritis type   . Long term current use of anticoagulant therapy     Plan: Given his long-term right knee pain, gait deformity, history of arthritis, referral to orthopedist  PT/INR labs today for chronic anticoagulation, lifelong given history of recurrent DVT and history of A. fib.  I reviewed his recent cardiology notes, echocardiogram from 10/2016 with moderately dilated left ventricle  with ejection fraction 50%, diffuse hypokinesis, dilated right ventricle, decreased systolic function.  He sees Dr. Gala Romney, heart failure clinic, and Dr. Loman Brooklyn electrophysiology.   Roberto [name] was seen today for knee pain.  Diagnoses and all orders for this visit:  Chronic pain of right knee -     Ambulatory referral to Orthopedic Surgery  Knee deformity, acquired, right -     Ambulatory referral to Orthopedic Surgery  Gait disturbance -     Ambulatory referral to Orthopedic Surgery  Chronic systolic heart failure (HCC) -     Protime-INR  Venous insufficiency -     Protime-INR  Osteoarthritis of right knee, unspecified osteoarthritis type  Long term current use of anticoagulant therapy

## 2017-03-13 NOTE — Telephone Encounter (Signed)
Refer to [facility]

## 2017-03-16 ENCOUNTER — Telehealth: Payer: Self-pay | Admitting: Medical

## 2017-03-16 ENCOUNTER — Telehealth (HOSPITAL_COMMUNITY): Payer: Self-pay | Admitting: *Deleted

## 2017-03-16 MED ORDER — TORSEMIDE 20 MG PO TABS: 40.0000 mg | ORAL_TABLET | Freq: Two times a day (BID) | ORAL | 0 refills | Status: DC

## 2017-03-16 NOTE — Telephone Encounter (Signed)
Faxed to [facility].

## 2017-03-16 NOTE — Telephone Encounter (Signed)
Was able to leave a message now regarding pt's coumadin level and instructions.  Also asked that he call back to confirm that he got the message

## 2017-03-16 NOTE — Telephone Encounter (Signed)
Advanced Heart Failure Triage Encounter  Patient Name: Roberto [name]  Date of Call: 03/16/17  Problem:  Patient called stating he is completely out of his lasix.  He was told that he could double his dose if he feels fluid overloaded.  So he has been doing this about 3 times week.    Plan:  I spoke with Maxine Glenn, PA and he advises patient to come in and be seen, he should not be taking extra lasix that often.  Scheduled patient tomorrow at 11:30 and sent for Torsemide 40 mg BID to pharmacy per Jill Alexanders.  Patient is agreeable with plan.   Georgina Peer, RN

## 2017-03-17 ENCOUNTER — Other Ambulatory Visit: Payer: Self-pay

## 2017-03-17 ENCOUNTER — Other Ambulatory Visit (HOSPITAL_COMMUNITY): Payer: Self-pay

## 2017-03-17 ENCOUNTER — Ambulatory Visit (HOSPITAL_COMMUNITY)
Admission: RE | Admit: 2017-03-17 | Discharge: 2017-03-17 | Disposition: A | Discharge status: Home / Self Care | Payer: BC Managed Care – PPO | Source: Ambulatory Visit | Attending: Cardiology | Admitting: Cardiology

## 2017-03-17 ENCOUNTER — Encounter (HOSPITAL_COMMUNITY): Payer: Self-pay

## 2017-03-17 ENCOUNTER — Telehealth (HOSPITAL_COMMUNITY): Payer: Self-pay

## 2017-03-17 VITALS — BP 112/58 | HR 127 | Ht 67.0 in | Wt 296.0 lb

## 2017-03-17 DIAGNOSIS — I493 Ventricular premature depolarization: Secondary | ICD-10-CM | POA: Diagnosis not present

## 2017-03-17 DIAGNOSIS — I5022 Chronic systolic (congestive) heart failure: Secondary | ICD-10-CM

## 2017-03-17 DIAGNOSIS — Z01810 Encounter for preprocedural cardiovascular examination: Secondary | ICD-10-CM | POA: Insufficient documentation

## 2017-03-17 DIAGNOSIS — Z86718 Personal history of other venous thrombosis and embolism: Secondary | ICD-10-CM

## 2017-03-17 DIAGNOSIS — I2510 Atherosclerotic heart disease of native coronary artery without angina pectoris: Secondary | ICD-10-CM | POA: Insufficient documentation

## 2017-03-17 DIAGNOSIS — I484 Atypical atrial flutter: Secondary | ICD-10-CM | POA: Diagnosis not present

## 2017-03-17 DIAGNOSIS — Z01812 Encounter for preprocedural laboratory examination: Secondary | ICD-10-CM | POA: Diagnosis not present

## 2017-03-17 DIAGNOSIS — I4892 Unspecified atrial flutter: Secondary | ICD-10-CM | POA: Diagnosis not present

## 2017-03-17 DIAGNOSIS — I483 Typical atrial flutter: Secondary | ICD-10-CM

## 2017-03-17 DIAGNOSIS — Z6841 Body Mass Index (BMI) 40.0 and over, adult: Secondary | ICD-10-CM | POA: Diagnosis not present

## 2017-03-17 DIAGNOSIS — I739 Peripheral vascular disease, unspecified: Secondary | ICD-10-CM | POA: Diagnosis not present

## 2017-03-17 DIAGNOSIS — I824Z9 Acute embolism and thrombosis of unspecified deep veins of unspecified distal lower extremity: Secondary | ICD-10-CM | POA: Diagnosis not present

## 2017-03-17 DIAGNOSIS — I82409 Acute embolism and thrombosis of unspecified deep veins of unspecified lower extremity: Secondary | ICD-10-CM | POA: Insufficient documentation

## 2017-03-17 LAB — BASIC METABOLIC PANEL
Anion gap: 9 (ref 5–15)
BUN: 12 mg/dL (ref 6–20)
CALCIUM: 8.7 mg/dL — AB (ref 8.9–10.3)
CHLORIDE: 103 mmol/L (ref 101–111)
CO2: 26 mmol/L (ref 22–32)
CREATININE: 1.1 mg/dL (ref 0.61–1.24)
GFR calc non Af Amer: >60 mL/min (ref >60)
Glucose, Bld: 113 mg/dL — ABNORMAL HIGH (ref 65–99)
Potassium: 4.1 mmol/L (ref 3.5–5.1)
SODIUM: 138 mmol/L (ref 135–145)

## 2017-03-17 LAB — CBC
HCT: 46.5 % (ref 39.0–52.0)
Hemoglobin: 15.2 g/dL (ref 13.0–17.0)
MCH: 29.3 pg (ref 26.0–34.0)
MCHC: 32.7 g/dL (ref 30.0–36.0)
MCV: 89.8 fL (ref 78.0–100.0)
Platelets: 192 10*3/uL (ref 150–400)
RBC: 5.18 MIL/uL (ref 4.22–5.81)
RDW: 14.2 % (ref 11.5–15.5)
WBC: 9.1 10*3/uL (ref 4.0–10.5)

## 2017-03-17 LAB — PROTIME-INR
INR: 1.43
Prothrombin Time: 17.3 seconds — ABNORMAL HIGH (ref 11.4–15.2)

## 2017-03-17 LAB — MAGNESIUM: MAGNESIUM: 1.7 mg/dL (ref 1.7–2.4)

## 2017-03-17 MED ORDER — AMIODARONE HCL 400 MG PO TABS: 400.0000 mg | ORAL_TABLET | Freq: Two times a day (BID) | ORAL | 11 refills | Status: DC

## 2017-03-17 MED ORDER — APIXABAN 5 MG PO TABS: 5.0000 mg | ORAL_TABLET | Freq: Two times a day (BID) | ORAL | 11 refills | Status: DC

## 2017-03-17 MED ORDER — MAGNESIUM 400 MG PO TABS: 400.0000 mg | ORAL_TABLET | Freq: Every day | ORAL | 11 refills | Status: DC

## 2017-03-17 NOTE — Progress Notes (Signed)
Advanced Heart Failure Clinic Note   Patient ID: Roberto [name], male   DOB: 1965-12-02, 52 y.o.   MRN: [id number] PCP: Dr Jacqulyn Bath Cardiologist: Dr [name] EP: Dr Johney Frame Coumadin followed by PCP  HPI: Roberto [name] is a 52 y.o. 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   Admitted to [facility] 12/16/11 for recurrent HF. Diuresed 20 pounds with IV lasix. See RHC/LHC results   Admitted 10/14/16-10/21/16 with AF and tachy induced cardiomyopathy. EF dropped from 40%-> 15%. Underwent DCCV on 10/17/16 with successful conversion to NSR. Started on amiodarone. Diuresed 12 pounds with IV lasix. Discharge weight was 279 pounds. .   S/p AFL ablation 11/17/2016  He presents today for follow up.  Has requiring increasing dosages of lasix up to 160 mg BID, so transitioned to torsemide and scheduled for follow up. He has been feeling OK, but weight and edema have been up over past few weeks, requiring extra lasix.  He went to get refill and was told it was too early, so has only been taking once daily through the weekend. He denies SOB with ADLs or walking on flat ground. He denies bleeding on coumadin, though his INRs have been mostly sub-therapeutic. Denies palpitations, CP, or dizziness.   EKG today shows Aflutter 141 bpm, personally reviewed.   Echo 10/15/16 - LVEF 15%  RHC 10/20/16  RA = 3 RV = 45/7 PA = 49/20 (31) PCW = 28 Fick cardiac output/index = 5.0/2.1 PVR = 0.5 WU Ao sat = 95% PA sat = 64%, 61%  12/17/11 ECHO 25-30%  05/20/2012 ECHO EF 40-45%  CARDIAC MRI - 12/18/11  1. Moderately dilated left ventricle with moderately decreased systolic function, EF 38%. Global hypokinesis.  2. Mildly dilated right ventricle with mild to moderately decreased systolic function.  3. No definite LV thrombus noted.  4.  Patchy non-subendocardial delayed enhancement seen in the ventricular septum (see above for description). This is not suggestive of a coronary disease pattern. This could be suggestive of infiltrative disease versus prior myocarditis.  RHC/LHC 12/22/11  Cors: Normal  SH: Lives with his wife and 5 children. Works full time running a Day[employer]  Review of systems complete and found to be negative unless listed in HPI.    Past Medical History:  Diagnosis Date  . Arthritis   . Chronic combined systolic and diastolic CHF (congestive heart failure) (HCC)   . Deep vein thrombosis (HCC)   . Lupus anticoagulant positive   . Morbid obesity (HCC)   . Mural thrombus of heart    coumadin  . Nonischemic cardiomyopathy (HCC)    a) 12/17/11 echo: LVEF 25-30%, grade 3 diastolic dysfunction (c/w restriction), mod MR, mod LA/LV and mild RA dilatation; b) 12/18/11 cMRI: LVEF 38%, mod LV/mild RV dilatation, global HK, mild-mod RV sys dysfxn, no LV thrombus & patchy non-subendocardial delayed enhancement c/w infil dz or prior myocarditis   . Paroxysmal SVT (supraventricular tachycardia) (HCC)   . Pulmonary embolism (HCC)    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    Current Outpatient Medications  Medication Sig Dispense Refill  . acetaminophen (TYLENOL) 500 MG tablet Take 500-1,000 mg by mouth every 6 (six) hours as needed (for pain.).    [name] digoxin (DIGOX) 0.125 MG tablet TAKE 1 TABLET(0.125 MG) BY MOUTH DAILY 90  tablet 3  . losartan (COZAAR) 25 MG tablet Take 1 tablet (25 mg total) by mouth at bedtime. 30 tablet 6  . Naltrexone-Bupropion HCl ER (CONTRAVE) 8-90 MG TB12 Take 2 tablets by mouth 2 (two) times daily. 360 tablet 3  . potassium chloride SA (K-DUR,KLOR-CON) 20 MEQ tablet Take 1 tablet (20 mEq total) by mouth 2 (two) times daily. 180 tablet 3  . sildenafil (VIAGRA) 100 MG tablet Take 0.5-1 tablets (50-100 mg total) by mouth daily as needed for erectile dysfunction. 10 tablet 0  . spironolactone  (ALDACTONE) 25 MG tablet Take 0.5 tablets (12.5 mg total) by mouth daily. 45 tablet 3  . Testosterone (ANDROGEL) 20.25 MG/1.25GM (1.62%) GEL Place 2 Squirts onto the skin daily. 1.25 g 2  . torsemide (DEMADEX) 20 MG tablet Take 2 tablets (40 mg total) by mouth 2 (two) times daily. 120 tablet 0  . warfarin (COUMADIN) 1 MG tablet Take 1 tablet (1 mg total) by mouth daily. 30 tablet 5  . warfarin (COUMADIN) 2 MG tablet Take 1 tablet (2 mg total) by mouth daily. 30 tablet 0  . warfarin (COUMADIN) 5 MG tablet Take 1 tablet (5 mg total) by mouth See admin instructions. 30 tablet 5   No current facility-administered medications for this encounter.    PHYSICAL EXAM: Vitals:   03/17/17 1139  BP: (!) 112/58  Pulse: (!) 127  SpO2: 97%  Weight: 296 lb (134.3 kg)  Height: 5\' 7"  (1.702 m)   Wt Readings from Last 3 Encounters:  03/17/17 296 lb (134.3 kg)  03/13/17 295 lb 12.8 oz (134.2 kg)  01/14/17 (!) 309 lb 6.4 oz (140.3 kg)    Physical Exam General: Well appearing. No resp difficulty. Obese. HEENT: Normal Neck: Supple. JVP to jaw. Carotids 2+ bilat; no bruits. No thyromegaly or nodule noted. Cor: PMI nondisplaced. Irregularly, irregular, tachy. No M/G/R noted Lungs: CTAB, normal effort. Abdomen: Obese, soft, non-tender, non-distended, no HSM. No bruits or masses. +BS  Extremities: No cyanosis, clubbing, or rash. Trace to 1+ LLE, RLE with trace ankle edema.  Neuro: Alert & orientedx3, cranial nerves grossly intact. moves all 4 extremities w/o difficulty. Affect pleasant   EKG: Aflutter 140, personally reviewed and confirmed with MD.   ASSESSMENT & PLAN:  1. Chronic Systolic Heart Failure: Recent Echo EF 15%, drop from 40-45% in 2016, felt to be related to Afib.  - Echo 10/15/16 - LVEF 15% - NYHA II-III by report.  - Volume status mildly elevated on exam.  - Change lasix to torsemide 40 mg BID.  - Continue spiro 12.5 mg daily - Continue digoxin 0.125 mg daily. - No b-blocker with low  output/recent decompensation - Continue losartan 25 mg qhs. BMET/Mg today.  - Reinforced fluid restriction to < 2 L daily, sodium restriction to less than 2000 mg daily, and the importance of daily weights.    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 - EKG today show AFL in 140s.  - Amiodarone stopped in December. Will need to resume at 400 mg BID for the time being.  - Stop Coumadin. Will put on Eliquis 5 mg BID.  He states Xarelto made him "gassy" - Will plan for TEE/DCCV on Friday.  - Plan to repeat Sleep study once improved.   3. DVT/PE - Stop coumadin with poor compliance and INRs.   - INR today. Will base Eliquis timing on results.   4. Morbid Obestiy  - Stressed importance of weight loss.   5. ED - OK to  use 25 mg sildenafil as needed.  Not on any long acting nitrates.   Switch to Eliquis as above. Plan for TEE/DCCV on Friday. Volume status OK. Will switch to torsemide with excessive lasix use (sometimes up to 160 mg BID at a time).  Labs today.  RTC 2 weeks. Keep 6 week appt with Echo.   Graciella Freer, PA-C  11:38 AM   Patient seen and examined with the above-signed Advanced Practice Provider and/or Housestaff. I personally reviewed laboratory data, imaging studies and relevant notes. I independently examined the patient and formulated the important aspects of the plan. I have edited the note to reflect any of my changes or salient points. I have personally discussed the plan with the patient and/or family.  He is back in AFL today with RVR after amio stopped. Now with worsening volume overload despite increasing oral diuretics. I have reviewed ECGs with Dr. Elberta Fortis in EP. Will restart amio, restart Eliquis and plan TEE/DC-CV on Friday. Will change lasix to torsemide to help with volume overload. If LV function does not improved with maintenance of NSR may need advanced therapies.   Total time personally spent 45 minutes. Over half that time spent discussing above.    Arvilla Meres, MD  2:20 PM

## 2017-03-17 NOTE — Patient Instructions (Addendum)
Routine lab work today. Will notify you of abnormal results, otherwise no news is good news!  STOP Coumadin.  START Eliquis 5 mg twice daily. WE WILL CALL TO INFORM YOU WHEN TO START THIS.  START Amiodarone 400 mg tablet twice daily.  Will refer you to electrophysiology at [facility] with Dr. Elberta Fortis. Address: [address] ([address] Floor), [place], [place] [address]  Phone: [phone] Their office will contact you by phone to schedule.  You have been scheduled for a cardioversion. Please see attached instruction sheet for additional details.  Follow up 2 weeks with Otilio Saber PA-C.  ________________________________________________________ [place] Code:  9002  Take all medication as prescribed the day of your appointment. Bring all medications with you to your appointment.  Do the following things EVERYDAY: 1) Weigh yourself in the morning before breakfast. Write it down and keep it in a log. 2) Take your medicines as prescribed 3) Eat low salt foods-Limit salt (sodium) to 2000 mg per day.  4) Stay as active as you can everyday 5) Limit all fluids for the day to less than 2 liters

## 2017-03-17 NOTE — Telephone Encounter (Signed)
Result Notes for Basic metabolic panel   Notes recorded by Chyrl Civatte, RN on 03/17/2017 at 3:57 PM EST Patient aware and agreeable, instructed on how to print eliquis copay card from home. Verbalizes will pick up Rx's this afternoon. ------  Notes recorded by Graciella Freer, PA-C on 03/17/2017 at 3:42 PM EST Reviewed with Pharm D.    Can start Eliquis 5 mg BID TONIGHT. No more coumadin.   Add Magnesium 400 mg daily  [place]" [place], [place] 03/17/2017 3:42 PM

## 2017-03-18 ENCOUNTER — Ambulatory Visit: Payer: BC Managed Care – PPO | Admitting: Family Medicine

## 2017-03-18 ENCOUNTER — Encounter: Payer: Self-pay | Admitting: Family Medicine

## 2017-03-18 ENCOUNTER — Telehealth (HOSPITAL_COMMUNITY): Payer: Self-pay | Admitting: *Deleted

## 2017-03-18 VITALS — BP 110/70 | HR 132 | Temp 97.9°F | Resp 16 | Wt 300.4 lb

## 2017-03-18 DIAGNOSIS — R319 Hematuria, unspecified: Secondary | ICD-10-CM | POA: Diagnosis not present

## 2017-03-18 DIAGNOSIS — N3001 Acute cystitis with hematuria: Secondary | ICD-10-CM | POA: Diagnosis not present

## 2017-03-18 DIAGNOSIS — R300 Dysuria: Secondary | ICD-10-CM

## 2017-03-18 LAB — POCT URINALYSIS DIP (PROADVANTAGE DEVICE)
BILIRUBIN UA: NEGATIVE
BILIRUBIN UA: NEGATIVE mg/dL
Glucose, UA: NEGATIVE mg/dL
LEUKOCYTES UA: NEGATIVE
Nitrite, UA: NEGATIVE
PH UA: 6 (ref 5.0–8.0)
Protein Ur, POC: 30 mg/dL — AB
SPECIFIC GRAVITY, URINE: 1.025
Urobilinogen, Ur: NEGATIVE

## 2017-03-18 NOTE — Telephone Encounter (Signed)
[facility] #[id number]    Request Status:  Authorized  Health Plan: [employer]   Valid Dates:  03/18/2017 - 04/16/2017      Scheduled Date of Service:  03/20/2017

## 2017-03-18 NOTE — Progress Notes (Signed)
Chief Complaint  Patient presents with  . possible UTI    Possible UTI, started sunday, burning    Subjective:  Roberto [name] is a 52 y.o. male who complains of possible urinary tract infection.  He has had symptoms for 3 days.  Symptoms include dysuria, a stinging sensation at the tip of his penis after urinating only. No penile discharge.  Denies urinary frequency, urgency, hesitancy, changes in stream, testicle pain. Reports he feels like he completely empties his bladder. Patient denies fever, chills, back pain, lower abdominal pain, N/V/D.  Last UTI was never.   Using nothing for current symptoms.   Sexually active and last encounter was 2 weeks ago.   Patient HR is elevated and he states it was [facility] and is scheduled for a cardioversion on Friday. States he feels "fine". Denies chest pain, palpitations, shortness of breath.  Started a new medication last night to slow his pulse, amiodarone.   Patient does not have a history of recurrent UTI. Patient does not have a history of pyelonephritis.  No other aggravating or relieving factors.  No other c/o.  Past Medical History:  Diagnosis Date  . Arthritis   . Chronic combined systolic and diastolic CHF (congestive heart failure) (HCC)   . Deep vein thrombosis (HCC)   . Lupus anticoagulant positive   . Morbid obesity (HCC)   . Mural thrombus of heart    coumadin  . Nonischemic cardiomyopathy (HCC)    a) 12/17/11 echo: LVEF 25-30%, grade 3 diastolic dysfunction (c/w restriction), mod MR, mod LA/LV and mild RA dilatation; b) 12/18/11 cMRI: LVEF 38%, mod LV/mild RV dilatation, global HK, mild-mod RV sys dysfxn, no LV thrombus & patchy non-subendocardial delayed enhancement c/w infil dz or prior myocarditis   . Paroxysmal SVT (supraventricular tachycardia) (HCC)   . Pulmonary embolism (HCC)    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    ROS as in subjective  Reviewed allergies, medications, past medical,  surgical, and social history.    Objective: Vitals:   03/18/17 1334 03/18/17 1353  BP: 110/70   Pulse: (!) 135 (!) 132  Resp: 16   Temp: 97.9 F (36.6 C)   SpO2: 97%     General appearance: alert, no distress, WD/WN, male Abdomen: +bs, soft, non tender, non distended, no masses, no hepatomegaly, no splenomegaly, no bruits Back: no CVA tenderness GU: declines      Laboratory:  Urine dipstick: pos for hemoglobin.  Sent for microscopy and culture      Assessment: Acute cystitis with hematuria  Burning with urination - Plan: POCT Urinalysis DIP (Proadvantage Device)  Dysuria - Plan: Urine Culture, GC/Chlamydia Probe Amp, Urine Microscopic  Hematuria, unspecified type - Plan: Urine Microscopic    Plan: Discussed symptoms, diagnosis, possible complications, and usual course of illness of a UTI.   Discussed initiating treatment but we will hold off until we have results. Urine microscopy and urine culture ordered. GC/CT also ordered.    His warfarin was discontinued yesterday and he is now on Eliquis.  He is tachycardic but this was worked up by his cardiologist yesterday and started him on amiodarone last night. Reports feeling fine and asymptomatic.  He is schedule for a cardioversion on Friday. He will call me Friday to let me know how he is doing with urinary symptoms.

## 2017-03-19 ENCOUNTER — Other Ambulatory Visit: Payer: Self-pay | Admitting: Medical

## 2017-03-19 LAB — URINE CULTURE: ORGANISM ID, BACTERIA: NO GROWTH

## 2017-03-19 LAB — URINALYSIS, MICROSCOPIC ONLY: Epithelial Cells (non renal): NONE SEEN /hpf (ref 0–10)

## 2017-03-19 MED ORDER — CIPROFLOXACIN HCL 500 MG PO TABS: 500.0000 mg | ORAL_TABLET | Freq: Two times a day (BID) | ORAL | 0 refills | Status: DC

## 2017-03-20 ENCOUNTER — Encounter (HOSPITAL_COMMUNITY)
Admission: RE | Disposition: A | Discharge status: Home / Self Care | Payer: Self-pay | Source: Ambulatory Visit | Attending: Internal Medicine

## 2017-03-20 ENCOUNTER — Other Ambulatory Visit: Payer: Self-pay

## 2017-03-20 ENCOUNTER — Ambulatory Visit (HOSPITAL_COMMUNITY)
Admission: RE | Admit: 2017-03-20 | Discharge: 2017-03-20 | Disposition: A | Discharge status: Home / Self Care | Payer: BC Managed Care – PPO | Source: Ambulatory Visit | Attending: Internal Medicine | Admitting: Internal Medicine

## 2017-03-20 ENCOUNTER — Ambulatory Visit (HOSPITAL_COMMUNITY): Payer: BC Managed Care – PPO | Admitting: Certified Registered Nurse Anesthetist

## 2017-03-20 ENCOUNTER — Inpatient Hospital Stay (HOSPITAL_COMMUNITY)
Admission: EM | Admit: 2017-03-20 | Discharge: 2017-03-26 | DRG: 292 | Disposition: A | Discharge status: Home / Self Care | Payer: BC Managed Care – PPO | Attending: Internal Medicine | Admitting: Internal Medicine

## 2017-03-20 ENCOUNTER — Telehealth: Payer: Self-pay | Admitting: Cardiology

## 2017-03-20 ENCOUNTER — Encounter (HOSPITAL_COMMUNITY): Payer: Self-pay | Admitting: *Deleted

## 2017-03-20 ENCOUNTER — Ambulatory Visit (HOSPITAL_COMMUNITY): Payer: BC Managed Care – PPO

## 2017-03-20 ENCOUNTER — Encounter (HOSPITAL_COMMUNITY): Payer: Self-pay | Admitting: Emergency Medicine

## 2017-03-20 DIAGNOSIS — I872 Venous insufficiency (chronic) (peripheral): Secondary | ICD-10-CM | POA: Diagnosis present

## 2017-03-20 DIAGNOSIS — I472 Ventricular tachycardia: Secondary | ICD-10-CM | POA: Diagnosis present

## 2017-03-20 DIAGNOSIS — Z832 Family history of diseases of the blood and blood-forming organs and certain disorders involving the immune mechanism: Secondary | ICD-10-CM

## 2017-03-20 DIAGNOSIS — I248 Other forms of acute ischemic heart disease: Secondary | ICD-10-CM | POA: Diagnosis present

## 2017-03-20 DIAGNOSIS — I509 Heart failure, unspecified: Secondary | ICD-10-CM

## 2017-03-20 DIAGNOSIS — I739 Peripheral vascular disease, unspecified: Secondary | ICD-10-CM

## 2017-03-20 DIAGNOSIS — Z86711 Personal history of pulmonary embolism: Secondary | ICD-10-CM

## 2017-03-20 DIAGNOSIS — Z79899 Other long term (current) drug therapy: Secondary | ICD-10-CM

## 2017-03-20 DIAGNOSIS — I428 Other cardiomyopathies: Secondary | ICD-10-CM | POA: Diagnosis present

## 2017-03-20 DIAGNOSIS — I340 Nonrheumatic mitral (valve) insufficiency: Secondary | ICD-10-CM | POA: Diagnosis not present

## 2017-03-20 DIAGNOSIS — I8390 Asymptomatic varicose veins of unspecified lower extremity: Secondary | ICD-10-CM | POA: Diagnosis present

## 2017-03-20 DIAGNOSIS — I5023 Acute on chronic systolic (congestive) heart failure: Secondary | ICD-10-CM | POA: Diagnosis not present

## 2017-03-20 DIAGNOSIS — Z833 Family history of diabetes mellitus: Secondary | ICD-10-CM

## 2017-03-20 DIAGNOSIS — Z6841 Body Mass Index (BMI) 40.0 and over, adult: Secondary | ICD-10-CM

## 2017-03-20 DIAGNOSIS — R0902 Hypoxemia: Secondary | ICD-10-CM | POA: Diagnosis present

## 2017-03-20 DIAGNOSIS — M1990 Unspecified osteoarthritis, unspecified site: Secondary | ICD-10-CM | POA: Insufficient documentation

## 2017-03-20 DIAGNOSIS — D6862 Lupus anticoagulant syndrome: Secondary | ICD-10-CM | POA: Diagnosis present

## 2017-03-20 DIAGNOSIS — M109 Gout, unspecified: Secondary | ICD-10-CM | POA: Diagnosis present

## 2017-03-20 DIAGNOSIS — I5043 Acute on chronic combined systolic (congestive) and diastolic (congestive) heart failure: Secondary | ICD-10-CM

## 2017-03-20 DIAGNOSIS — Z808 Family history of malignant neoplasm of other organs or systems: Secondary | ICD-10-CM

## 2017-03-20 DIAGNOSIS — I4892 Unspecified atrial flutter: Secondary | ICD-10-CM

## 2017-03-20 DIAGNOSIS — R911 Solitary pulmonary nodule: Secondary | ICD-10-CM | POA: Diagnosis present

## 2017-03-20 DIAGNOSIS — I493 Ventricular premature depolarization: Secondary | ICD-10-CM | POA: Diagnosis present

## 2017-03-20 DIAGNOSIS — L03116 Cellulitis of left lower limb: Secondary | ICD-10-CM | POA: Diagnosis present

## 2017-03-20 DIAGNOSIS — Z7901 Long term (current) use of anticoagulants: Secondary | ICD-10-CM

## 2017-03-20 DIAGNOSIS — E876 Hypokalemia: Secondary | ICD-10-CM | POA: Diagnosis present

## 2017-03-20 DIAGNOSIS — R509 Fever, unspecified: Secondary | ICD-10-CM

## 2017-03-20 DIAGNOSIS — Z86718 Personal history of other venous thrombosis and embolism: Secondary | ICD-10-CM

## 2017-03-20 DIAGNOSIS — Z8249 Family history of ischemic heart disease and other diseases of the circulatory system: Secondary | ICD-10-CM

## 2017-03-20 DIAGNOSIS — I484 Atypical atrial flutter: Secondary | ICD-10-CM

## 2017-03-20 DIAGNOSIS — N189 Chronic kidney disease, unspecified: Secondary | ICD-10-CM | POA: Diagnosis present

## 2017-03-20 DIAGNOSIS — N179 Acute kidney failure, unspecified: Secondary | ICD-10-CM | POA: Diagnosis present

## 2017-03-20 HISTORY — PX: CARDIOVERSION: SHX1299

## 2017-03-20 HISTORY — PX: TEE WITHOUT CARDIOVERSION: SHX5443

## 2017-03-20 LAB — GC/CHLAMYDIA PROBE AMP
Chlamydia trachomatis, NAA: NEGATIVE
NEISSERIA GONORRHOEAE BY PCR: NEGATIVE

## 2017-03-20 SURGERY — ECHOCARDIOGRAM, TRANSESOPHAGEAL
Anesthesia: General

## 2017-03-20 MED ORDER — LACTATED RINGERS IV SOLN
INTRAVENOUS | Status: DC | PRN
  Administered 2017-03-20: 12:00:00 via INTRAVENOUS

## 2017-03-20 MED ORDER — SODIUM CHLORIDE 0.9% FLUSH: 3.0000 mL | Freq: Two times a day (BID) | INTRAVENOUS | Status: DC

## 2017-03-20 MED ORDER — SODIUM CHLORIDE 0.9 % IV SOLN: INTRAVENOUS | Status: DC

## 2017-03-20 MED ORDER — SODIUM CHLORIDE 0.9 % IV SOLN: INTRAVENOUS | Status: DC | PRN

## 2017-03-20 MED ORDER — PHENYLEPHRINE 40 MCG/ML (10ML) SYRINGE FOR IV PUSH (FOR BLOOD PRESSURE SUPPORT)
PREFILLED_SYRINGE | INTRAVENOUS | Status: DC | PRN
  Administered 2017-03-20: 80 ug via INTRAVENOUS

## 2017-03-20 MED ORDER — SODIUM CHLORIDE 0.9% FLUSH: 3.0000 mL | INTRAVENOUS | Status: DC | PRN

## 2017-03-20 MED ORDER — BUTAMBEN-TETRACAINE-BENZOCAINE 2-2-14 % EX AERO
INHALATION_SPRAY | CUTANEOUS | Status: DC | PRN
  Administered 2017-03-20: 2 via TOPICAL

## 2017-03-20 MED ORDER — PROPOFOL 10 MG/ML IV BOLUS
INTRAVENOUS | Status: DC | PRN
  Administered 2017-03-20: 10 mg via INTRAVENOUS

## 2017-03-20 MED ORDER — SODIUM CHLORIDE 0.9 % IV SOLN: 250.0000 mL | INTRAVENOUS | Status: DC

## 2017-03-20 MED ORDER — PROPOFOL 500 MG/50ML IV EMUL
INTRAVENOUS | Status: DC | PRN
  Administered 2017-03-20: 50 ug/kg/min via INTRAVENOUS

## 2017-03-20 MED ORDER — HYDROCORTISONE 1 % EX CREA
1.0000 "application " | TOPICAL_CREAM | Freq: Three times a day (TID) | CUTANEOUS | Status: DC | PRN
  Filled 2017-03-20: qty 28

## 2017-03-20 NOTE — Progress Notes (Signed)
  Echocardiogram Echocardiogram Transesophageal has been performed.  Roberto [name] G [name] 03/20/2017, 2:55 PM

## 2017-03-20 NOTE — Anesthesia Preprocedure Evaluation (Signed)
Anesthesia Evaluation  Patient identified by MRN, date of birth, ID band Patient awake    Reviewed: Allergy & Precautions, NPO status , Patient's Chart, lab work & pertinent test results  Airway Mallampati: II  TM Distance: >3 FB Neck ROM: Full    Dental  (+) Dental Advisory Given   Pulmonary neg pulmonary ROS,    breath sounds clear to auscultation       Cardiovascular + Peripheral Vascular Disease and +CHF   Rhythm:Regular Rate:Normal     Neuro/Psych negative neurological ROS     GI/Hepatic negative GI ROS, Neg liver ROS,   Endo/Other  Morbid obesity  Renal/GU negative Renal ROS     Musculoskeletal  (+) Arthritis ,   Abdominal   Peds  Hematology negative hematology ROS (+)   Anesthesia Other Findings   Reproductive/Obstetrics                             Lab Results  Component Value Date   WBC 9.1 03/17/2017   HGB 15.2 03/17/2017   HCT 46.5 03/17/2017   MCV 89.8 03/17/2017   PLT 192 03/17/2017   Lab Results  Component Value Date   CREATININE 1.10 03/17/2017   BUN 12 03/17/2017   NA 138 03/17/2017   K 4.1 03/17/2017   CL 103 03/17/2017   CO2 26 03/17/2017    Anesthesia Physical Anesthesia Plan  ASA: IV  Anesthesia Plan: General   Post-op Pain Management:    Induction: Intravenous  PONV Risk Score and Plan: 2 and Ondansetron, Propofol infusion and Treatment may vary due to age or medical condition  Airway Management Planned: Mask, Natural Airway and Nasal Cannula  Additional Equipment:   Intra-op Plan:   Post-operative Plan:   Informed Consent: I have reviewed the patients History and Physical, chart, labs and discussed the procedure including the risks, benefits and alternatives for the proposed anesthesia with the patient or authorized representative who has indicated his/her understanding and acceptance.     Plan Discussed with:   Anesthesia Plan  Comments:         Anesthesia Quick Evaluation

## 2017-03-20 NOTE — Anesthesia Procedure Notes (Signed)
Procedure Name: MAC Date/Time: 03/20/2017 1:55 PM Performed by: Harden Mo, CRNA Pre-anesthesia Checklist: Patient identified, Emergency Drugs available, Suction available and Patient being monitored Patient Re-evaluated:Patient Re-evaluated prior to induction Oxygen Delivery Method: Nasal cannula Preoxygenation: Pre-oxygenation with 100% oxygen Induction Type: IV induction Placement Confirmation: positive ETCO2 and breath sounds checked- equal and bilateral Dental Injury: Teeth and Oropharynx as per pre-operative assessment

## 2017-03-20 NOTE — Discharge Instructions (Signed)
TEE ° °YOU HAD AN CARDIAC PROCEDURE TODAY: Refer to the procedure report and other information in the discharge instructions given to you for any specific questions about what was found during the examination. If this information does not answer your questions, please call [facility] office at [phone] to clarify.  ° °DIET: Your first meal following the procedure should be a light meal and then it is ok to progress to your normal diet. A half-sandwich or bowl of soup is an example of a good first meal. Heavy or fried foods are harder to digest and may make you feel nauseous or bloated. Drink plenty of fluids but you should avoid alcoholic beverages for 24 hours. If you had a esophageal dilation, please see attached instructions for diet.  ° °ACTIVITY: Your care partner should take you home directly after the procedure. You should plan to take it easy, moving slowly for the rest of the day. You can resume normal activity the day after the procedure however YOU SHOULD NOT DRIVE, use power tools, machinery or perform tasks that involve climbing or major physical exertion for 24 hours (because of the sedation medicines used during the test).  ° °SYMPTOMS TO REPORT IMMEDIATELY: °A cardiologist can be reached at any hour. Please call [phone] for any of the following symptoms:  °Vomiting of blood or coffee ground material  °New, significant abdominal pain  °New, significant chest pain or pain under the shoulder blades  °Painful or persistently difficult swallowing  °New shortness of breath  °Black, tarry-looking or red, bloody stools ° °FOLLOW UP:  °Please also call with any specific questions about appointments or follow up tests. ° °Electrical Cardioversion, Care After °This sheet gives you information about how to care for yourself after your procedure. Your health care provider may also give you more specific instructions. If you have problems or questions, contact your health care provider. °What can I  expect after the procedure? °After the procedure, it is common to have: °· Some redness on the skin where the shocks were given. ° °Follow these instructions at home: °· Do not drive for 24 hours if you were given a medicine to help you relax (sedative). °· Take over-the-counter and prescription medicines only as told by your health care provider. °· Ask your health care provider how to check your pulse. Check it often. °· Rest for 48 hours after the procedure or as told by your health care provider. °· Avoid or limit your caffeine use as told by your health care provider. °Contact a health care provider if: °· You feel like your heart is beating too quickly or your pulse is not regular. °· You have a serious muscle cramp that does not go away. °Get help right away if: °· You have discomfort in your chest. °· You are dizzy or you feel faint. °· You have trouble breathing or you are short of breath. °· Your speech is slurred. °· You have trouble moving an arm or leg on one side of your body. °· Your fingers or toes turn cold or blue. °This information is not intended to replace advice given to you by your health care provider. Make sure you discuss any questions you have with your health care provider. °Document Released: 10/20/2012 Document Revised: 08/03/2015 Document Reviewed: 07/06/2015 °Elsevier Interactive Patient Education © 2018 [employer] ° °

## 2017-03-20 NOTE — CV Procedure (Signed)
   TRANSESOPHAGEAL ECHOCARDIOGRAM GUIDED DIRECT CURRENT CARDIOVERSION  NAME:  Roberto [name]   MRN: [id number] DOB:  1966/01/07   ADMIT DATE: 03/20/2017  INDICATIONS:   PROCEDURE:   Informed consent was obtained prior to the procedure. The risks, benefits and alternatives for the procedure were discussed and the patient comprehended these risks.  Risks include, but are not limited to, cough, sore throat, vomiting, nausea, somnolence, esophageal and stomach trauma or perforation, bleeding, low blood pressure, aspiration, pneumonia, infection, trauma to the teeth and death.    After a procedural time-out, the oropharynx was anesthetized and the patient was sedated by the anesthesia service. The transesophageal probe was inserted in the esophagus and stomach without difficulty and multiple views were obtained.   FINDINGS:  LEFT VENTRICLE: Markedly dilated EF 15-20% with severe global HK  RIGHT VENTRICLE: Dilated severely reduced function  LEFT ATRIUM: Massively dilated 7.2cm  LEFT ATRIAL APPENDAGE: No clot  RIGHT ATRIUM: Normal  AORTIC VALVE:  [place]. Trivial AI  MITRAL VALVE:    Normal mild MR  TRICUSPID VALVE: Normal mild TR  PULMONIC VALVE: Normal no PI  INTERATRIAL SEPTUM: Bulging to right due to elevated LA pressure  PERICARDIUM: No effusion   DESCENDING AORTA: Mild plaque   CARDIOVERSION:     Indications:  Atrial Flutter  Procedure Details:  Once the TEE was complete, the patient had the defibrillator pads placed in the anterior and posterior position. Once an appropriate level of sedation was achieved, the patient received a single biphasic, synchronized 200J shock with prompt conversion to sinus rhythm. No apparent complications.   Arvilla Meres, MD  2:30 PM

## 2017-03-20 NOTE — Telephone Encounter (Signed)
Pt owes money so pt will not be seen there

## 2017-03-20 NOTE — Transfer of Care (Signed)
Immediate Anesthesia Transfer of Care Note  Patient: Roberto [name]  Procedure(s) Performed: TRANSESOPHAGEAL ECHOCARDIOGRAM (TEE) (N/A ) CARDIOVERSION (N/A )  Patient Location: Endoscopy Unit  Anesthesia Type:General  Level of Consciousness: awake and alert   Airway & Oxygen Therapy: Patient Spontanous Breathing and Patient connected to nasal cannula oxygen  Post-op Assessment: Report given to RN, Post -op Vital signs reviewed and stable and Patient moving all extremities X 4  Post vital signs: Reviewed and stable  Last Vitals:  Vitals:   03/20/17 1214  BP: 107/63  Pulse: (!) 128  Resp: 20  Temp: 36.7 C  SpO2: 95%    Last Pain:  Vitals:   03/20/17 1214  TempSrc: Oral         Complications: No apparent anesthesia complications

## 2017-03-20 NOTE — ED Triage Notes (Signed)
Reports having cardioversion earlier today at Cardiologists office for afib.  Reports when trying to go to bed tonight feeling SOB.

## 2017-03-20 NOTE — Telephone Encounter (Signed)
I was notified to call the patient. He has a history of HFrEF and underwent AF DCCV today.   He states that he feels short of breath this evening as he does when he retains fluid; he denies chest pain. He states that he usually takes torsemide twice per day but only took one dose today because of the procedure. He was encouraged to take his second dose and monitor his symptoms. He will come to the ED if he has severe symptoms.

## 2017-03-21 ENCOUNTER — Emergency Department (HOSPITAL_COMMUNITY): Payer: BC Managed Care – PPO

## 2017-03-21 ENCOUNTER — Encounter (HOSPITAL_COMMUNITY): Payer: Self-pay | Admitting: Internal Medicine

## 2017-03-21 ENCOUNTER — Other Ambulatory Visit: Payer: Self-pay

## 2017-03-21 DIAGNOSIS — N189 Chronic kidney disease, unspecified: Secondary | ICD-10-CM | POA: Diagnosis not present

## 2017-03-21 DIAGNOSIS — N179 Acute kidney failure, unspecified: Secondary | ICD-10-CM

## 2017-03-21 DIAGNOSIS — I428 Other cardiomyopathies: Secondary | ICD-10-CM | POA: Diagnosis present

## 2017-03-21 DIAGNOSIS — I872 Venous insufficiency (chronic) (peripheral): Secondary | ICD-10-CM | POA: Diagnosis present

## 2017-03-21 DIAGNOSIS — M109 Gout, unspecified: Secondary | ICD-10-CM | POA: Diagnosis present

## 2017-03-21 DIAGNOSIS — Z6841 Body Mass Index (BMI) 40.0 and over, adult: Secondary | ICD-10-CM | POA: Diagnosis not present

## 2017-03-21 DIAGNOSIS — M1000 Idiopathic gout, unspecified site: Secondary | ICD-10-CM | POA: Diagnosis not present

## 2017-03-21 DIAGNOSIS — Z8249 Family history of ischemic heart disease and other diseases of the circulatory system: Secondary | ICD-10-CM | POA: Diagnosis not present

## 2017-03-21 DIAGNOSIS — R0902 Hypoxemia: Secondary | ICD-10-CM | POA: Diagnosis present

## 2017-03-21 DIAGNOSIS — R911 Solitary pulmonary nodule: Secondary | ICD-10-CM | POA: Diagnosis present

## 2017-03-21 DIAGNOSIS — I8390 Asymptomatic varicose veins of unspecified lower extremity: Secondary | ICD-10-CM | POA: Diagnosis present

## 2017-03-21 DIAGNOSIS — E876 Hypokalemia: Secondary | ICD-10-CM | POA: Diagnosis present

## 2017-03-21 DIAGNOSIS — L03116 Cellulitis of left lower limb: Secondary | ICD-10-CM | POA: Diagnosis present

## 2017-03-21 DIAGNOSIS — I480 Paroxysmal atrial fibrillation: Secondary | ICD-10-CM | POA: Diagnosis not present

## 2017-03-21 DIAGNOSIS — I5043 Acute on chronic combined systolic (congestive) and diastolic (congestive) heart failure: Secondary | ICD-10-CM

## 2017-03-21 DIAGNOSIS — I4892 Unspecified atrial flutter: Secondary | ICD-10-CM

## 2017-03-21 DIAGNOSIS — Z86711 Personal history of pulmonary embolism: Secondary | ICD-10-CM | POA: Diagnosis not present

## 2017-03-21 DIAGNOSIS — I509 Heart failure, unspecified: Secondary | ICD-10-CM

## 2017-03-21 DIAGNOSIS — I5023 Acute on chronic systolic (congestive) heart failure: Secondary | ICD-10-CM | POA: Diagnosis not present

## 2017-03-21 DIAGNOSIS — I493 Ventricular premature depolarization: Secondary | ICD-10-CM | POA: Diagnosis present

## 2017-03-21 DIAGNOSIS — Z808 Family history of malignant neoplasm of other organs or systems: Secondary | ICD-10-CM | POA: Diagnosis not present

## 2017-03-21 DIAGNOSIS — I472 Ventricular tachycardia: Secondary | ICD-10-CM | POA: Diagnosis present

## 2017-03-21 DIAGNOSIS — Z832 Family history of diseases of the blood and blood-forming organs and certain disorders involving the immune mechanism: Secondary | ICD-10-CM | POA: Diagnosis not present

## 2017-03-21 DIAGNOSIS — Z833 Family history of diabetes mellitus: Secondary | ICD-10-CM | POA: Diagnosis not present

## 2017-03-21 DIAGNOSIS — I248 Other forms of acute ischemic heart disease: Secondary | ICD-10-CM | POA: Diagnosis present

## 2017-03-21 DIAGNOSIS — I5041 Acute combined systolic (congestive) and diastolic (congestive) heart failure: Secondary | ICD-10-CM | POA: Diagnosis not present

## 2017-03-21 DIAGNOSIS — D6862 Lupus anticoagulant syndrome: Secondary | ICD-10-CM | POA: Diagnosis present

## 2017-03-21 DIAGNOSIS — Z86718 Personal history of other venous thrombosis and embolism: Secondary | ICD-10-CM | POA: Diagnosis not present

## 2017-03-21 LAB — BASIC METABOLIC PANEL
Anion gap: 11 (ref 5–15)
BUN: 14 mg/dL (ref 6–20)
CHLORIDE: 100 mmol/L — AB (ref 101–111)
CO2: 27 mmol/L (ref 22–32)
Calcium: 8.6 mg/dL — ABNORMAL LOW (ref 8.9–10.3)
Creatinine, Ser: 1.33 mg/dL — ABNORMAL HIGH (ref 0.61–1.24)
GFR calc non Af Amer: >60 mL/min (ref >60)
Glucose, Bld: 114 mg/dL — ABNORMAL HIGH (ref 65–99)
Potassium: 3.8 mmol/L (ref 3.5–5.1)
Sodium: 138 mmol/L (ref 135–145)

## 2017-03-21 LAB — CBC
HCT: 45.8 % (ref 39.0–52.0)
HEMOGLOBIN: 14.6 g/dL (ref 13.0–17.0)
MCH: 28.9 pg (ref 26.0–34.0)
MCHC: 31.9 g/dL (ref 30.0–36.0)
MCV: 90.7 fL (ref 78.0–100.0)
Platelets: 197 10*3/uL (ref 150–400)
RBC: 5.05 MIL/uL (ref 4.22–5.81)
RDW: 14.4 % (ref 11.5–15.5)
WBC: 11.8 10*3/uL — ABNORMAL HIGH (ref 4.0–10.5)

## 2017-03-21 LAB — DIGOXIN LEVEL: DIGOXIN LVL: 0.4 ng/mL — AB (ref 0.8–2.0)

## 2017-03-21 LAB — TROPONIN I
TROPONIN I: 0.03 ng/mL — AB (ref <0.03)
TROPONIN I: 0.03 ng/mL — AB (ref <0.03)
Troponin I: 0.03 ng/mL (ref <0.03)

## 2017-03-21 LAB — I-STAT TROPONIN, ED: Troponin i, poc: 0.01 ng/mL (ref 0.00–0.08)

## 2017-03-21 LAB — BRAIN NATRIURETIC PEPTIDE: B NATRIURETIC PEPTIDE 5: 345.8 pg/mL — AB (ref 0.0–100.0)

## 2017-03-21 MED ORDER — MAGNESIUM OXIDE 400 (241.3 MG) MG PO TABS
400.0000 mg | ORAL_TABLET | Freq: Every day | ORAL | Status: DC
  Administered 2017-03-21 – 2017-03-26 (×6): 400 mg via ORAL
  Filled 2017-03-21 (×6): qty 1

## 2017-03-21 MED ORDER — SODIUM CHLORIDE 0.9 % IV SOLN
250.0000 mL | INTRAVENOUS | Status: DC | PRN
Start: 2017-03-21 — End: 2017-03-26

## 2017-03-21 MED ORDER — DIGOXIN 125 MCG PO TABS
0.1250 mg | ORAL_TABLET | Freq: Every day | ORAL | Status: DC
  Administered 2017-03-21 – 2017-03-26 (×6): 0.125 mg via ORAL
  Filled 2017-03-21 (×6): qty 1

## 2017-03-21 MED ORDER — FUROSEMIDE 10 MG/ML IJ SOLN
80.0000 mg | Freq: Once | INTRAMUSCULAR | Status: AC
  Administered 2017-03-21: 80 mg via INTRAVENOUS
  Filled 2017-03-21: qty 8

## 2017-03-21 MED ORDER — SODIUM CHLORIDE 0.9% FLUSH: 3.0000 mL | INTRAVENOUS | Status: DC | PRN

## 2017-03-21 MED ORDER — CIPROFLOXACIN HCL 500 MG PO TABS
500.0000 mg | ORAL_TABLET | Freq: Two times a day (BID) | ORAL | Status: AC
  Administered 2017-03-21 – 2017-03-25 (×9): 500 mg via ORAL
  Filled 2017-03-21 (×9): qty 1

## 2017-03-21 MED ORDER — MAGNESIUM SULFATE 2 GM/50ML IV SOLN
2.0000 g | Freq: Once | INTRAVENOUS | Status: AC
  Administered 2017-03-21: 2 g via INTRAVENOUS
  Filled 2017-03-21: qty 50

## 2017-03-21 MED ORDER — SODIUM CHLORIDE 0.9% FLUSH
3.0000 mL | Freq: Two times a day (BID) | INTRAVENOUS | Status: DC
  Administered 2017-03-21 – 2017-03-26 (×11): 3 mL via INTRAVENOUS

## 2017-03-21 MED ORDER — ACETAMINOPHEN 325 MG PO TABS
650.0000 mg | ORAL_TABLET | ORAL | Status: DC | PRN
  Administered 2017-03-22 – 2017-03-23 (×2): 650 mg via ORAL
  Filled 2017-03-21 (×2): qty 2

## 2017-03-21 MED ORDER — OMEGA-3-ACID ETHYL ESTERS 1 G PO CAPS
1.0000 g | ORAL_CAPSULE | Freq: Every day | ORAL | Status: DC
  Administered 2017-03-21 – 2017-03-26 (×6): 1 g via ORAL
  Filled 2017-03-21 (×6): qty 1

## 2017-03-21 MED ORDER — NALTREXONE-BUPROPION HCL ER 8-90 MG PO TB12: 2.0000 | ORAL_TABLET | Freq: Two times a day (BID) | ORAL | Status: DC

## 2017-03-21 MED ORDER — AMIODARONE HCL 200 MG PO TABS
400.0000 mg | ORAL_TABLET | Freq: Two times a day (BID) | ORAL | Status: DC
  Administered 2017-03-21 – 2017-03-26 (×11): 400 mg via ORAL
  Filled 2017-03-21 (×11): qty 2

## 2017-03-21 MED ORDER — SPIRONOLACTONE 12.5 MG HALF TABLET
12.5000 mg | ORAL_TABLET | Freq: Every day | ORAL | Status: DC
  Administered 2017-03-21 – 2017-03-26 (×6): 12.5 mg via ORAL
  Filled 2017-03-21 (×7): qty 1

## 2017-03-21 MED ORDER — IOPAMIDOL (ISOVUE-370) INJECTION 76%
INTRAVENOUS | Status: AC
  Administered 2017-03-21: 100 mL
  Filled 2017-03-21: qty 100

## 2017-03-21 MED ORDER — CIPROFLOXACIN HCL 500 MG PO TABS
500.0000 mg | ORAL_TABLET | Freq: Two times a day (BID) | ORAL | Status: DC
  Administered 2017-03-21: 500 mg via ORAL
  Filled 2017-03-21: qty 1

## 2017-03-21 MED ORDER — ONDANSETRON HCL 4 MG/2ML IJ SOLN: 4.0000 mg | Freq: Four times a day (QID) | INTRAMUSCULAR | Status: DC | PRN

## 2017-03-21 MED ORDER — APIXABAN 5 MG PO TABS
5.0000 mg | ORAL_TABLET | Freq: Two times a day (BID) | ORAL | Status: DC
  Administered 2017-03-21 – 2017-03-26 (×11): 5 mg via ORAL
  Filled 2017-03-21 (×11): qty 1

## 2017-03-21 NOTE — ED Notes (Signed)
Patient transported to CT 

## 2017-03-21 NOTE — Progress Notes (Signed)
CRITICAL VALUE ALERT  Critical Value: troponin  0.03  Date & Time Notied:  03/21/17  0550  Provider Notified: MD notified   Orders Received/Actions taken: None right now.

## 2017-03-21 NOTE — Progress Notes (Signed)
MD stated to give lasix 80mg  and to continue to monitor BP.  Elsie Lincoln, RN

## 2017-03-21 NOTE — ED Notes (Signed)
Patient transported to X-ray 

## 2017-03-21 NOTE — H&P (Signed)
Cardiology Admission History and Physical:   Patient ID: Roberto [name]; MRN: [id number]; DOB: 11-12-1965   Admission date: 03/20/2017  Primary Care Provider: [name], MD Primary Cardiologist: Bensimhon Primary Electrophysiologist:  Elberta Fortis  Chief Complaint:  Dyspnea  Patient Profile:   Roberto [name] is a 52 y.o. male with a history of severe HFrEF, atrial flutter, prior DVT/PE, morbid obesity, who presents with dyspnea and heart failure exacerbation.   History of Present Illness:   Roberto [name] is a 52 y.o. male with a history of severe HFrEF (EF 15-20%), atrial flutter s/p ablation and recent TEE/DCCV (03/20/17), LV thrombus, recurrent DVT/PE, morbid obesity, who presents with dyspnea and heart failure exacerbation.  The patient has severe systolic heart failure that has been attributed to his atrial arrhythmias. He follows with Dr. Gala Romney for his heart failure and Dr Elberta Fortis for his EP management. He underwent AFL ablation in 11/2016. He was seen for follow up appointment earlier in the week, at which time he reported worsening volume overload despite increasing his furosemide to 160 mg BID, so he was switched to torsemide. He was noted to have an AFL recurrence, so he underwent repeat TEE/DCCV yesterday.   Following the procedure, the patient noted persistent/worsening dyspnea. He took his second daily dose of torsemide without improvement of symptoms, so he came to the the ED. In the ED, the patient was hypoxic to the mid-80s, which improved with 4L O2. SBP were in the 80s-90s, with HR sinus in the 70-80s. ECG showed NSR with low voltage, significant atrial enlargement, prolonged QT, nonspecific ST changes, and PVCs. CXR showed pulmonary edema. CTA showed likely chronic filling defect without signs of acute PE, although study was of limited quality. Labs notable for Cr 1.33 (baseline 1.1), BNP 345 (prior measurement 210) negative troponin. He was given 80 mg of IV  furosemide.  On my evaluation, the patient had just received furosemide and states he has not had significant UOP. He reports ongoing, mild dyspnea with ambulation or laying flat. He also reports mild chest tightness with movement (which is not typical for him). He states that his LE edema has not significantly changed. He denies other complaints.    Past Medical History:  Diagnosis Date  . Arthritis   . Chronic combined systolic and diastolic CHF (congestive heart failure) (HCC)   . Deep vein thrombosis (HCC)   . Lupus anticoagulant positive   . Morbid obesity (HCC)   . Mural thrombus of heart    coumadin  . Nonischemic cardiomyopathy (HCC)    a) 12/17/11 echo: LVEF 25-30%, grade 3 diastolic dysfunction (c/w restriction), mod MR, mod LA/LV and mild RA dilatation; b) 12/18/11 cMRI: LVEF 38%, mod LV/mild RV dilatation, global HK, mild-mod RV sys dysfxn, no LV thrombus & patchy non-subendocardial delayed enhancement c/w infil dz or prior myocarditis   . Paroxysmal SVT (supraventricular tachycardia) (HCC)   . 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 A-FLUTTER ABLATION N/A 11/17/2016   Procedure: A-FLUTTER ABLATION;  Surgeon: Regan Lemming, MD;  Location: [facility] INVASIVE CV LAB;  Service: Cardiovascular;  Laterality: N/A;  . CARDIAC CATHETERIZATION  06/2006   Angiographically normal cors  . CARDIAC CATHETERIZATION  12/22/2011   R/LHC: normal cors, well-compensated HDs, LV dysfxn  . CARDIOVERSION N/A 10/17/2016   Procedure: CARDIOVERSION;  Surgeon: Dolores Patty, MD;  Location: [facility] ENDOSCOPY;  Service: Cardiovascular;  Laterality: N/A;  . LEFT AND RIGHT HEART CATHETERIZATION WITH CORONARY ANGIOGRAM N/A 12/22/2011   Procedure: LEFT AND RIGHT HEART CATHETERIZATION WITH CORONARY ANGIOGRAM;  Surgeon: Dolores Patty, MD;  Location: [facility] CATH LAB;  Service: Cardiovascular;  Laterality: N/A;  . PATELLAR TENDON  REPAIR     Left  . RIGHT HEART CATH N/A 10/20/2016   Procedure: RIGHT HEART CATH;  Surgeon: Dolores Patty, MD;  Location: [facility] INVASIVE CV LAB;  Service: Cardiovascular;  Laterality: N/A;     Medications Prior to Admission: Prior to Admission medications   Medication Sig Start Date End Date Taking? Authorizing Provider  acetaminophen (TYLENOL) 500 MG tablet Take 500-1,000 mg by mouth every 6 (six) hours as needed for moderate pain or headache.    Yes [provider]  amiodarone (PACERONE) 400 MG tablet Take 1 tablet (400 mg total) by mouth 2 (two) times daily. 03/17/17  Yes Graciella Freer, PA-C  apixaban (ELIQUIS) 5 MG TABS tablet Take 1 tablet (5 mg total) by mouth 2 (two) times daily. 03/17/17  Yes Graciella Freer, PA-C  ciprofloxacin (CIPRO) 500 MG tablet Take 1 tablet (500 mg total) by mouth 2 (two) times daily. 03/19/17  Yes Henson, Vickie L, NP-C  coconut oil OIL Apply 1 application topically daily.   Yes [provider]  digoxin (DIGOX) 0.125 MG tablet TAKE 1 TABLET(0.125 MG) BY MOUTH DAILY 11/13/16  Yes Bensimhon, Bevelyn Buckles, MD  losartan (COZAAR) 25 MG tablet Take 1 tablet (25 mg total) by mouth at bedtime. 12/12/16 03/21/24 Yes TilleryMariam Dollar, PA-C  Magnesium 400 MG TABS Take 400 mg by mouth daily. 03/17/17  Yes Graciella Freer, PA-C  Naltrexone-Bupropion HCl ER (CONTRAVE) 8-90 MG TB12 Take 2 tablets by mouth 2 (two) times daily. 12/09/16  Yes Tysinger, Kermit Balo, PA-C  Omega-3 Fatty Acids (FISH OIL PO) Take 1 capsule by mouth 2 (two) times daily.   Yes [provider]  potassium chloride SA (K-DUR,KLOR-CON) 20 MEQ tablet Take 1 tablet (20 mEq total) by mouth 2 (two) times daily. 11/13/16  Yes Bensimhon, Bevelyn Buckles, MD  sildenafil (VIAGRA) 100 MG tablet Take 0.5-1 tablets (50-100 mg total) by mouth daily as needed for erectile dysfunction. 01/14/17  Yes Tysinger, Kermit Balo, PA-C  spironolactone (ALDACTONE) 25 MG tablet Take 0.5 tablets (12.5 mg  total) by mouth daily. 11/13/16  Yes Bensimhon, Bevelyn Buckles, MD  Testosterone (ANDROGEL) 20.25 MG/1.25GM (1.62%) GEL Place 2 Squirts onto the skin daily. 01/19/17  Yes Tysinger, Kermit Balo, PA-C  torsemide (DEMADEX) 20 MG tablet Take 2 tablets (40 mg total) by mouth 2 (two) times daily. 03/16/17  Yes Graciella Freer, PA-C  warfarin (COUMADIN) 2 MG tablet Take 1 tablet (2 mg total) by mouth daily. 03/13/17 03/17/17  Tysinger, Kermit Balo, PA-C     Allergies:    Allergies  Allergen Reactions  . Shrimp [Shellfish Allergy] Hives and Itching    Social History:   Social History   Socioeconomic History  . Marital status: Divorced    Spouse name: Not on file  . Number of children: 5  . Years of education: Not on file  . Highest education level: Not on file  Social Needs  . Financial resource strain: Not on file  . Food insecurity - worry: Not on file  . Food insecurity - inability: Not on file  . Transportation needs - medical: Not on file  . Transportation needs - non-medical: Not on file  Occupational History  . 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 Smoking status: Never Smoker  . Smokeless tobacco: Never Used  Substance and Sexual Activity  . Alcohol use: No    Comment: Rarely  . Drug use: No  . Sexual activity: Not on file  Other Topics Concern  . Not on file  Social History Narrative   Lives in [place] 03/2015, 5 children.   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  Exercises with weights and some walking. 12/2015    Family History:  [name] family history includes Cancer in his mother; Clotting disorder in his mother; Diabetes in his father, other, and sister; Heart attack in his father; Hypertension in his mother and other; Obesity in his other, sister, and sister. There is no history of Colon cancer, Stomach cancer, Esophageal cancer, Rectal cancer, or Liver cancer.     ROS:  Please see the history of present illness.  All other ROS reviewed and negative.     Physical Exam/Data:   Vitals:   03/21/17 0130 03/21/17 0145 03/21/17 0200 03/21/17 0215  BP: 99/71 98/66 98/87    Pulse: 78 79  85  Resp: (!) 28 (!) 36  19  Temp:      TempSrc:      SpO2: 95% 95%  93%  Weight:      Height:       No intake or output data in the 24 hours ending 03/21/17 0312 Filed Weights   03/20/17 2346  Weight: 134.3 kg (296 lb)   Body mass index is 46.36 kg/m.  General:  Well nourished, well developed, in no acute distress  HEENT: normal Neck: JVD at upper neck at 90 degrees Cardiac:  normal S1, S2; RRR; II/VI systolic murmur Lungs:  Distant heart sounds with crackles and coarse breath sounds R>L  Abd: Obese soft, nontender Ext: Chronic firm with venous changes in LLE, RLE soft with 2+ pitting edema  Musculoskeletal:  No deformities, BUE and BLE strength normal and equal Skin: warm and dry  Neuro: No focal abnormalities noted Psych:  Normal affect    EKG:  The ECG that was done and was personally reviewed and demonstrates NSR with low voltage, significant atrial enlargement, prolonged QT, nonspecific ST changes, and PVCs.   Relevant CV Studies:  TTE 03/20/17- LV severely dilated with EF 15-20%, RV severely dilated with reduced function, massive LA dilation, trivial AI, mild MR, mild TR  Echo 10/15/16 - LVEF 15%  RHC 10/20/16  RA = 3 RV = 45/7 PA = 49/20 (31) PCW = 28 Fick cardiac output/index = 5.0/2.1 PVR = 0.5 WU Ao sat = 95% PA sat = 64%, 61%  12/17/11 ECHO 25-30%  05/20/2012 ECHO EF 40-45%  CARDIAC MRI - 12/18/11  1. Moderately dilated left ventricle with moderately decreased systolic function, EF 38%. Global hypokinesis.  2. Mildly dilated right ventricle with mild to moderately decreased systolic function.  3. No definite LV thrombus noted.  4. Patchy non-subendocardial delayed enhancement seen in the ventricular septum (see above for  description). This is not suggestive of a coronary disease pattern. This could be suggestive of infiltrative disease versus prior myocarditis.  RHC/LHC 12/22/11  Cors: Normal  Laboratory Data:  Chemistry Recent Labs  Lab 03/17/17 1304 03/20/17 2352  NA 138 138  K 4.1 3.8  CL 103 100*  CO2 26 27  GLUCOSE 113* 114*  BUN 12  14  CREATININE 1.10 1.33*  CALCIUM 8.7* 8.6*  GFRNONAA >60 >60  GFRAA >60 >60  ANIONGAP 9 11    No results for input(s): PROT, ALBUMIN, AST, ALT, ALKPHOS, BILITOT in the last 168 hours. Hematology Recent Labs  Lab 03/17/17 1304 03/20/17 2352  WBC 9.1 11.8*  RBC 5.18 5.05  HGB 15.2 14.6  HCT 46.5 45.8  MCV 89.8 90.7  MCH 29.3 28.9  MCHC 32.7 31.9  RDW 14.2 14.4  PLT 192 197   Cardiac EnzymesNo results for input(s): TROPONINI in the last 168 hours.  Recent Labs  Lab 03/20/17 2355  TROPIPOC 0.01    BNP Recent Labs  Lab 03/20/17 2352  BNP 345.8*    DDimer No results for input(s): DDIMER in the last 168 hours.  Radiology/Studies:  Dg Chest 2 View  Result Date: 03/21/2017 CLINICAL DATA:  Chest pain EXAM: CHEST - 2 VIEW COMPARISON:  10/14/2016 FINDINGS: Cardiomegaly with vascular congestion and moderate diffuse interstitial and hazy opacities suspect for edema. No significant pleural effusion. Negative for a pneumothorax. IMPRESSION: Cardiomegaly with vascular congestion and mild pulmonary edema. Electronically Signed   By: Jasmine Pang M.D.   On: 03/21/2017 00:52   Ct Angio Chest Pe W Or Wo Contrast  Result Date: 03/21/2017 CLINICAL DATA:  Cardioversion for AFib, short of breath EXAM: CT ANGIOGRAPHY CHEST WITH CONTRAST TECHNIQUE: Multidetector CT imaging of the chest was performed using the standard protocol during bolus administration of intravenous contrast. Multiplanar CT image reconstructions and MIPs were obtained to evaluate the vascular anatomy. CONTRAST:  ISOVUE-370 IOPAMIDOL (ISOVUE-370) INJECTION 76% COMPARISON:  Radiograph  03/21/2017, CT chest 12/19/2005 FINDINGS: Cardiovascular: Satisfactory opacification of the main central pulmonary arteries with suboptimal opacification of distal branch vessels. Linear, bandlike filling defect within the right inter lobar pulmonary artery. No other discrete filling defects are visualized. Nonaneurysmal aorta. Cardiomegaly. No significant pericardial effusion. Mediastinum/Nodes: Midline trachea. No thyroid mass. No significantly enlarged lymph nodes. Small distal esophageal hiatal hernia. Lungs/Pleura: No significant pleural effusion. Bilateral ground-glass densities and septal thickening, greatest in the lung bases suspect for pulmonary edema. 9 mm ground-glass nodule right upper lobe, series 8, image number 22. Upper Abdomen: Subcentimeter hypodensities in the liver too small to further characterize. Vague hypodense mass in the liver measuring at least 4.8 cm, likely decreased compared to prior CT. Musculoskeletal: Degenerative changes of the spine. No acute or suspicious lesion Review of the MIP images confirms the above findings. IMPRESSION: 1. Poor opacification of distal pulmonary arterial branch vessels limiting evaluation for distal PE. 2. Linear band like filling defect within the right inter lobar pulmonary artery, suspected to represent sequela of chronic pulmonary embolus given history of prior PE in the region. There are otherwise no significant central filling defects. 3. Cardiomegaly with bilateral ground-glass density and septal thickening consistent with pulmonary edema. 4. 9 mm right upper lobe ground-glass nodule. Initial follow-up with CT at 6-12 months is recommended to confirm persistence. If persistent, repeat CT is recommended every 2 years until 5 years of stability has been established. This recommendation follows the consensus statement: Guidelines for Management of Incidental Pulmonary Nodules Detected on CT Images: From the Fleischner Society 2017; Radiology 2017;  284:228-243. 5. Vague hypodense mass in the right hepatic lobe, likely decreased in size from prior CT and possibly representing hemangioma. Electronically Signed   By: Jasmine Pang M.D.   On: 03/21/2017 02:47    Assessment and Plan:   Roberto [name] is a 52 y.o. male with a  history of severe HFrEF (EF 15-20%), atrial flutter s/p ablation and recent TEE/DCCV (03/20/17), LV thrombus, recurrent DVT/PE, morbid obesity, who presents with dyspnea and heart failure exacerbation.  HFrEF with acute exacerbation The patient has severe HFrEF that has been attributed no NICM, likely in the setting of arrhythmia. He was recently seen in clinic with progressive dyspnea and hypervolemia, and he was noted to have recurrence of his flutter. He underwent successful TEE/DCCV yesterday but has had ongoing dyspnea. In the ED he has hypoxia and signs of hypervolemia on exam and radiography consistent with heart failure exacerbation. He will need diuresis with ongoing medical optimization. -S/P 80 mg IV furosemide in the ED. Will give another 80 mg now as he had been taking 160 mg furosemide with transition to torsemide as outpatient. Will monitor response and redose as appropriate. -Will hold losartan given borderline BP and elevated Cr. Will cautiously continue spironolactone. -No BB given hypotension/decompensation -Continue home digoxin  Atrial flutter ECG shows persistent NSR following DCCV today. -Telemetry ordered -Continue recently started amiodarone, apixaban  Chest discomfort The patient reports slight chest discomfort. His last angiogram in 2013 showed no CAD. His ECG and negative troponin are reassuring, and his symptoms are likely due to heart failure exacerbation and TEE/DCCV.  -Will trend troponins for now  AoCKD Cr 1.33, which is increased from recent baseline of 1.1. Suspect renal congestion in the setting of volume overload -Continue diuresis as above  Hx LV thrombus Hx DVT -Continue apixaban    Recent UTI Was started on cipro as outpatient for UTI. Reports improvement of sx tonight -Continue cipro. -Monitor QTC  Pulmonary nodule  Incidentally noted on CT -Will need monitoring per protocol   Obesity -Continue contrave   Severity of Illness: The appropriate patient status for this patient is INPATIENT. Inpatient status is judged to be reasonable and necessary in order to provide the required intensity of service to ensure the patient's safety. [name] presenting symptoms, physical exam findings, and initial radiographic and laboratory data in the context of their chronic comorbidities is felt to place them at high risk for further clinical deterioration. Furthermore, it is not anticipated that the patient will be medically stable for discharge from the hospital within 2 midnights of admission. The following factors support the patient status of inpatient.   " [name] presenting symptoms include dyspnea, chest discomfort. " The worrisome physical exam findings include hypervolemia. " The initial radiographic and laboratory data are worrisome because of pulmonary edema. " The chronic co-morbidities include heart failure, AF.   * I certify that at the point of admission it is my clinical judgment that the patient will require inpatient hospital care spanning beyond 2 midnights from the point of admission due to high intensity of service, high risk for further deterioration and high frequency of surveillance required.*    For questions or updates, please contact [facility] Please consult [email or web address] for contact info under Cardiology/STEMI.    Signed, Ernest Mallick, MD  03/21/2017 3:12 AM

## 2017-03-21 NOTE — Progress Notes (Signed)
Patient's BP is soft and 89/54 manually. MD has been notified. Will continue to monitor.   Elsie Lincoln, RN

## 2017-03-21 NOTE — ED Provider Notes (Signed)
[facility] EMERGENCY DEPARTMENT Provider Note   CSN: [id number] Arrival date & time: 03/20/17  2341     History   Chief Complaint Chief Complaint  Patient presents with  . Shortness of Breath    HPI Roberto [name] is a 52 y.o. male.  Patient presents to the emergency department for evaluation of shortness of breath.  Patient reports that he had outpatient cardioversion for atrial flutter today.  He felt well immediately after the procedure.  Sometime after the procedure he started to notice a slight tightness in his chest which he attributed to the shock.  Tonight, however, when he laid down to go to bed he noticed that he was very short of breath.  Presented to the ER for evaluation of this shortness of breath.  He was noted to have room air oxygen saturation of 85% in triage, approximately back to the room.  Patient feels some improvement on oxygen which she does not normally wear.      Past Medical History:  Diagnosis Date  . Arthritis   . Chronic combined systolic and diastolic CHF (congestive heart failure) (HCC)   . Deep vein thrombosis (HCC)   . Lupus anticoagulant positive   . Morbid obesity (HCC)   . Mural thrombus of heart    coumadin  . Nonischemic cardiomyopathy (HCC)    a) 12/17/11 echo: LVEF 25-30%, grade 3 diastolic dysfunction (c/w restriction), mod MR, mod LA/LV and mild RA dilatation; b) 12/18/11 cMRI: LVEF 38%, mod LV/mild RV dilatation, global HK, mild-mod RV sys dysfxn, no LV thrombus & patchy non-subendocardial delayed enhancement c/w infil dz or prior myocarditis   . Paroxysmal SVT (supraventricular tachycardia) (HCC)   . Pulmonary embolism (HCC)    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    Patient Active Problem List   Diagnosis Date Noted  . Atypical atrial flutter (HCC) 03/17/2017  . Chronic pain of right knee 03/13/2017  . Typical atrial flutter (HCC)   . CHF (congestive heart failure) (HCC) 10/14/2016  . Varicose veins of legs  12/17/2015  . Encounter for health maintenance examination in adult 12/17/2015  . Special screening for malignant neoplasms, colon 12/17/2015  . Vaccine counseling 12/17/2015  . Screening for prostate cancer 12/17/2015  . Knee deformity, acquired, right 12/17/2015  . Gait disturbance 12/17/2015  . Osteoarthritis of right knee 12/17/2015  . Varicose veins of lower extremities with ulcer (HCC) 09/18/2015  . Need for prophylactic vaccination and inoculation against influenza 09/18/2015  . Impaired fasting blood sugar 12/28/2014  . Long term current use of anticoagulant therapy 12/28/2014  . Erectile dysfunction 02/19/2012  . Chronic systolic heart failure (HCC) 12/29/2011  . Hypokalemia 12/24/2011  . Lupus anticoagulant positive 12/22/2011  . Nonischemic cardiomyopathy (HCC) 12/16/2011  . Paroxysmal SVT (supraventricular tachycardia) (HCC) 12/16/2011  . History of venous thromboembolism 12/16/2011  . Venous insufficiency 11/04/2011  . OBESITY, MORBID 06/02/2008    Past Surgical History:  Procedure Laterality Date  . A-FLUTTER ABLATION N/A 11/17/2016   Procedure: A-FLUTTER ABLATION;  Surgeon: Regan Lemming, MD;  Location: [facility] INVASIVE CV LAB;  Service: Cardiovascular;  Laterality: N/A;  . CARDIAC CATHETERIZATION  06/2006   Angiographically normal cors  . CARDIAC CATHETERIZATION  12/22/2011   R/LHC: normal cors, well-compensated HDs, LV dysfxn  . CARDIOVERSION N/A 10/17/2016   Procedure: CARDIOVERSION;  Surgeon: Dolores Patty, MD;  Location: [facility] ENDOSCOPY;  Service: Cardiovascular;  Laterality: N/A;  . LEFT AND RIGHT HEART CATHETERIZATION WITH CORONARY ANGIOGRAM  N/A 12/22/2011   Procedure: LEFT AND RIGHT HEART CATHETERIZATION WITH CORONARY ANGIOGRAM;  Surgeon: Dolores Patty, MD;  Location: [facility] CATH LAB;  Service: Cardiovascular;  Laterality: N/A;  . PATELLAR TENDON REPAIR     Left  . RIGHT HEART CATH N/A 10/20/2016   Procedure: RIGHT HEART CATH;  Surgeon: Dolores Patty, MD;  Location: [facility] INVASIVE CV LAB;  Service: Cardiovascular;  Laterality: N/A;       Home Medications    Prior to Admission medications   Medication Sig Start Date End Date Taking? Authorizing Provider  acetaminophen (TYLENOL) 500 MG tablet Take 500-1,000 mg by mouth every 6 (six) hours as needed for moderate pain or headache.    Yes [provider]  amiodarone (PACERONE) 400 MG tablet Take 1 tablet (400 mg total) by mouth 2 (two) times daily. 03/17/17  Yes Graciella Freer, PA-C  apixaban (ELIQUIS) 5 MG TABS tablet Take 1 tablet (5 mg total) by mouth 2 (two) times daily. 03/17/17  Yes Graciella Freer, PA-C  ciprofloxacin (CIPRO) 500 MG tablet Take 1 tablet (500 mg total) by mouth 2 (two) times daily. 03/19/17  Yes Henson, Vickie L, NP-C  coconut oil OIL Apply 1 application topically daily.   Yes [provider]  digoxin (DIGOX) 0.125 MG tablet TAKE 1 TABLET(0.125 MG) BY MOUTH DAILY 11/13/16  Yes Bensimhon, Bevelyn Buckles, MD  losartan (COZAAR) 25 MG tablet Take 1 tablet (25 mg total) by mouth at bedtime. 12/12/16 03/21/24 Yes TilleryMariam Dollar, PA-C  Magnesium 400 MG TABS Take 400 mg by mouth daily. 03/17/17  Yes Graciella Freer, PA-C  Naltrexone-Bupropion HCl ER (CONTRAVE) 8-90 MG TB12 Take 2 tablets by mouth 2 (two) times daily. 12/09/16  Yes Tysinger, Kermit Balo, PA-C  Omega-3 Fatty Acids (FISH OIL PO) Take 1 capsule by mouth 2 (two) times daily.   Yes [provider]  potassium chloride SA (K-DUR,KLOR-CON) 20 MEQ tablet Take 1 tablet (20 mEq total) by mouth 2 (two) times daily. 11/13/16  Yes Bensimhon, Bevelyn Buckles, MD  sildenafil (VIAGRA) 100 MG tablet Take 0.5-1 tablets (50-100 mg total) by mouth daily as needed for erectile dysfunction. 01/14/17  Yes Tysinger, Kermit Balo, PA-C  spironolactone (ALDACTONE) 25 MG tablet Take 0.5 tablets (12.5 mg total) by mouth daily. 11/13/16  Yes Bensimhon, Bevelyn Buckles, MD  Testosterone (ANDROGEL) 20.25 MG/1.25GM (1.62%) GEL  Place 2 Squirts onto the skin daily. 01/19/17  Yes Tysinger, Kermit Balo, PA-C  torsemide (DEMADEX) 20 MG tablet Take 2 tablets (40 mg total) by mouth 2 (two) times daily. 03/16/17  Yes Graciella Freer, PA-C  warfarin (COUMADIN) 2 MG tablet Take 1 tablet (2 mg total) by mouth daily. 03/13/17 03/17/17  Tysinger, Kermit Balo, PA-C    Family History Family History  Problem Relation Age of Onset  . Hypertension Mother   . Clotting disorder Mother        mom with PEs  . Cancer Mother        uterine cancer  . Diabetes Father   . Heart attack Father   . Hypertension Other        Sibling  . Diabetes Other   . Obesity Other   . Diabetes Sister   . Obesity Sister   . Obesity Sister   . Colon cancer Neg Hx   . Stomach cancer Neg Hx   . Esophageal cancer Neg Hx   . Rectal cancer Neg Hx   . Liver cancer Neg Hx  Social History Social History   Tobacco Use  . Smoking status: Never Smoker  . Smokeless tobacco: Never Used  Substance Use Topics  . Alcohol use: No    Comment: Rarely  . Drug use: No     Allergies   Shrimp [shellfish allergy]   Review of Systems Review of Systems  Respiratory: Positive for chest tightness and shortness of breath.   All other systems reviewed and are negative.    Physical Exam Updated Vital Signs BP 98/87   Pulse 85   Temp 98.3 F (36.8 C) (Oral)   Resp 19   Ht 5\' 7"  (1.702 m)   Wt 134.3 kg (296 lb)   SpO2 93%   BMI 46.36 kg/m   Physical Exam  Constitutional: He is oriented to person, place, and time. He appears well-developed and well-nourished. No distress.  HENT:  Head: Normocephalic and atraumatic.  Right Ear: Hearing normal.  Left Ear: Hearing normal.  Nose: Nose normal.  Mouth/Throat: Oropharynx is clear and moist and mucous membranes are normal.  Eyes: Conjunctivae and EOM are normal. Pupils are equal, round, and reactive to light.  Neck: Normal range of motion. Neck supple.  Cardiovascular: Regular rhythm, S1 normal and S2  normal. Exam reveals no gallop and no friction rub.  No murmur heard. Pulmonary/Chest: Effort normal. No respiratory distress. He has decreased breath sounds. He has rales. He exhibits no tenderness.  Abdominal: Soft. Normal appearance and bowel sounds are normal. There is no hepatosplenomegaly. There is no tenderness. There is no rebound, no guarding, no tenderness at McBurney's point and negative Murphy's sign. No hernia.  Musculoskeletal: Normal range of motion.       Right lower leg: He exhibits edema.       Left lower leg: He exhibits edema.  Neurological: He is alert and oriented to person, place, and time. He has normal strength. No cranial nerve deficit or sensory deficit. Coordination normal. GCS eye subscore is 4. GCS verbal subscore is 5. GCS motor subscore is 6.  Skin: Skin is warm, dry and intact. No rash noted. No cyanosis.  Psychiatric: He has a normal mood and affect. His speech is normal and behavior is normal. Thought content normal.  Nursing note and vitals reviewed.    ED Treatments / Results  Labs (all labs ordered are listed, but only abnormal results are displayed) Labs Reviewed  BASIC METABOLIC PANEL - Abnormal; Notable for the following components:      Result Value   Chloride 100 (*)    Glucose, Bld 114 (*)    Creatinine, Ser 1.33 (*)    Calcium 8.6 (*)    All other components within normal limits  CBC - Abnormal; Notable for the following components:   WBC 11.8 (*)    All other components within normal limits  BRAIN NATRIURETIC PEPTIDE - Abnormal; Notable for the following components:   B Natriuretic Peptide 345.8 (*)    All other components within normal limits  I-STAT TROPONIN, ED    EKG  EKG Interpretation  Date/Time:  Friday March 20 2017 23:46:22 EST Ventricular Rate:  86 PR Interval:  194 QRS Duration: 102 QT Interval:  438 QTC Calculation: 524 R Axis:   -59 Text Interpretation:  Sinus rhythm with occasional Premature ventricular complexes  Biatrial enlargement Left axis deviation Low voltage QRS Prolonged QT Abnormal ECG Confirmed by Gilda Crease [phone]) on 03/20/2017 11:54:30 PM       Radiology Dg Chest 2 View  Result Date:  03/21/2017 CLINICAL DATA:  Chest pain EXAM: CHEST - 2 VIEW COMPARISON:  10/14/2016 FINDINGS: Cardiomegaly with vascular congestion and moderate diffuse interstitial and hazy opacities suspect for edema. No significant pleural effusion. Negative for a pneumothorax. IMPRESSION: Cardiomegaly with vascular congestion and mild pulmonary edema. Electronically Signed   By: Jasmine Pang M.D.   On: 03/21/2017 00:52   Ct Angio Chest Pe W Or Wo Contrast  Result Date: 03/21/2017 CLINICAL DATA:  Cardioversion for AFib, short of breath EXAM: CT ANGIOGRAPHY CHEST WITH CONTRAST TECHNIQUE: Multidetector CT imaging of the chest was performed using the standard protocol during bolus administration of intravenous contrast. Multiplanar CT image reconstructions and MIPs were obtained to evaluate the vascular anatomy. CONTRAST:  ISOVUE-370 IOPAMIDOL (ISOVUE-370) INJECTION 76% COMPARISON:  Radiograph 03/21/2017, CT chest 12/19/2005 FINDINGS: Cardiovascular: Satisfactory opacification of the main central pulmonary arteries with suboptimal opacification of distal branch vessels. Linear, bandlike filling defect within the right inter lobar pulmonary artery. No other discrete filling defects are visualized. Nonaneurysmal aorta. Cardiomegaly. No significant pericardial effusion. Mediastinum/Nodes: Midline trachea. No thyroid mass. No significantly enlarged lymph nodes. Small distal esophageal hiatal hernia. Lungs/Pleura: No significant pleural effusion. Bilateral ground-glass densities and septal thickening, greatest in the lung bases suspect for pulmonary edema. 9 mm ground-glass nodule right upper lobe, series 8, image number 22. Upper Abdomen: Subcentimeter hypodensities in the liver too small to further characterize. Vague  hypodense mass in the liver measuring at least 4.8 cm, likely decreased compared to prior CT. Musculoskeletal: Degenerative changes of the spine. No acute or suspicious lesion Review of the MIP images confirms the above findings. IMPRESSION: 1. Poor opacification of distal pulmonary arterial branch vessels limiting evaluation for distal PE. 2. Linear band like filling defect within the right inter lobar pulmonary artery, suspected to represent sequela of chronic pulmonary embolus given history of prior PE in the region. There are otherwise no significant central filling defects. 3. Cardiomegaly with bilateral ground-glass density and septal thickening consistent with pulmonary edema. 4. 9 mm right upper lobe ground-glass nodule. Initial follow-up with CT at 6-12 months is recommended to confirm persistence. If persistent, repeat CT is recommended every 2 years until 5 years of stability has been established. This recommendation follows the consensus statement: Guidelines for Management of Incidental Pulmonary Nodules Detected on CT Images: From the Fleischner Society 2017; Radiology 2017; 284:228-243. 5. Vague hypodense mass in the right hepatic lobe, likely decreased in size from prior CT and possibly representing hemangioma. Electronically Signed   By: Jasmine Pang M.D.   On: 03/21/2017 02:47    Procedures Procedures (including critical care time)  Medications Ordered in ED Medications  iopamidol (ISOVUE-370) 76 % injection (100 mLs  Contrast Given 03/21/17 0154)  furosemide (LASIX) injection 80 mg (80 mg Intravenous Given 03/21/17 0235)     Initial Impression / Assessment and Plan / ED Course  I have reviewed the triage vital signs and the nursing notes.  Pertinent labs & imaging results that were available during my care of the patient were reviewed by me and considered in my medical decision making (see chart for details).     Patient presents to the emergency department for evaluation of  shortness of breath.  He reports that symptoms began in the last couple of hours.  He only noticed it when he lie down and could not stay in a supine position because of severe shortness of breath.  Symptoms improve if he sits up but he is short of breath at rest.  Patient had outpatient TEE and cardioversion today.  He has an ejection fraction of approximately 15%.  His presentation and examination was most consistent with volume overload and pulmonary edema.  X-ray did suggest increased vascular congestion and CHF.  Patient does, however, have a history of DVT, PE.  He was recently transitioned from Coumadin to Eliquis.  Patient therefore underwent CT angiography to further evaluate.  He has what appears to be a chronic filling defect, but no clear evidence of a new PE.  Bilateral opacities consistent with pulmonary edema are seen.  Patient administered Lasix 80 mg IV and will require hospitalization for further management of decompensated congestive heart failure.  Final Clinical Impressions(s) / ED Diagnoses   Final diagnoses:  Acute on chronic combined systolic and diastolic congestive heart failure [facility])    ED Discharge Orders    None       Blinda Leatherwood Canary Brim, MD 03/21/17 [phone]

## 2017-03-22 DIAGNOSIS — I5023 Acute on chronic systolic (congestive) heart failure: Principal | ICD-10-CM

## 2017-03-22 LAB — BASIC METABOLIC PANEL
Anion gap: 10 (ref 5–15)
BUN: 13 mg/dL (ref 6–20)
CHLORIDE: 100 mmol/L — AB (ref 101–111)
CO2: 29 mmol/L (ref 22–32)
Calcium: 8.4 mg/dL — ABNORMAL LOW (ref 8.9–10.3)
Creatinine, Ser: 1.4 mg/dL — ABNORMAL HIGH (ref 0.61–1.24)
GFR calc non Af Amer: 57 mL/min — ABNORMAL LOW (ref >60)
Glucose, Bld: 99 mg/dL (ref 65–99)
POTASSIUM: 3.6 mmol/L (ref 3.5–5.1)
SODIUM: 139 mmol/L (ref 135–145)

## 2017-03-22 LAB — MAGNESIUM: MAGNESIUM: 1.9 mg/dL (ref 1.7–2.4)

## 2017-03-22 MED ORDER — FUROSEMIDE 10 MG/ML IJ SOLN
80.0000 mg | Freq: Two times a day (BID) | INTRAMUSCULAR | Status: DC
  Administered 2017-03-22 – 2017-03-25 (×6): 80 mg via INTRAVENOUS
  Filled 2017-03-22 (×6): qty 8

## 2017-03-22 NOTE — Progress Notes (Signed)
Patient ID: NUR KRALIK, male   DOB: [birth year], 52 y.o.   MRN: [id number]  Patient admitted earlier this morning with volume overload.  Starting to diurese with IV Lasix.  Will give him another dose this evening.  He remains in NSR.   Marca Ancona 03/22/2017 12:46 PM

## 2017-03-23 LAB — CBC WITH DIFFERENTIAL/PLATELET
BASOS ABS: 0 10*3/uL (ref 0.0–0.1)
BASOS PCT: 0 %
EOS ABS: 0.1 10*3/uL (ref 0.0–0.7)
Eosinophils Relative: 1 %
HEMATOCRIT: 42.3 % (ref 39.0–52.0)
Hemoglobin: 13.4 g/dL (ref 13.0–17.0)
Lymphocytes Relative: 16 %
Lymphs Abs: 2.2 10*3/uL (ref 0.7–4.0)
MCH: 28.9 pg (ref 26.0–34.0)
MCHC: 31.7 g/dL (ref 30.0–36.0)
MCV: 91.2 fL (ref 78.0–100.0)
Monocytes Absolute: 1.4 10*3/uL — ABNORMAL HIGH (ref 0.1–1.0)
Monocytes Relative: 10 %
NEUTROS ABS: 10.2 10*3/uL — AB (ref 1.7–7.7)
NEUTROS PCT: 73 %
Platelets: 174 10*3/uL (ref 150–400)
RBC: 4.64 MIL/uL (ref 4.22–5.81)
RDW: 14.4 % (ref 11.5–15.5)
WBC: 14 10*3/uL — ABNORMAL HIGH (ref 4.0–10.5)

## 2017-03-23 LAB — BASIC METABOLIC PANEL
ANION GAP: 11 (ref 5–15)
BUN: 12 mg/dL (ref 6–20)
CALCIUM: 8.3 mg/dL — AB (ref 8.9–10.3)
CO2: 28 mmol/L (ref 22–32)
CREATININE: 1.23 mg/dL (ref 0.61–1.24)
Chloride: 98 mmol/L — ABNORMAL LOW (ref 101–111)
Glucose, Bld: 100 mg/dL — ABNORMAL HIGH (ref 65–99)
Potassium: 3.3 mmol/L — ABNORMAL LOW (ref 3.5–5.1)
Sodium: 137 mmol/L (ref 135–145)

## 2017-03-23 LAB — MAGNESIUM: MAGNESIUM: 1.9 mg/dL (ref 1.7–2.4)

## 2017-03-23 MED ORDER — POTASSIUM CHLORIDE CRYS ER 20 MEQ PO TBCR
20.0000 meq | EXTENDED_RELEASE_TABLET | Freq: Once | ORAL | Status: AC
  Administered 2017-03-23: 20 meq via ORAL
  Filled 2017-03-23: qty 1

## 2017-03-23 MED ORDER — LOSARTAN POTASSIUM 25 MG PO TABS
12.5000 mg | ORAL_TABLET | Freq: Every day | ORAL | Status: DC
  Administered 2017-03-23 – 2017-03-24 (×2): 12.5 mg via ORAL
  Filled 2017-03-23 (×3): qty 1

## 2017-03-23 MED ORDER — POTASSIUM CHLORIDE CRYS ER 20 MEQ PO TBCR
40.0000 meq | EXTENDED_RELEASE_TABLET | Freq: Once | ORAL | Status: AC
  Administered 2017-03-23: 40 meq via ORAL
  Filled 2017-03-23: qty 2

## 2017-03-23 MED ORDER — METOLAZONE 2.5 MG PO TABS
2.5000 mg | ORAL_TABLET | Freq: Once | ORAL | Status: AC
  Administered 2017-03-23: 2.5 mg via ORAL
  Filled 2017-03-23: qty 1

## 2017-03-23 MED ORDER — COLCHICINE 0.6 MG PO TABS
0.6000 mg | ORAL_TABLET | Freq: Two times a day (BID) | ORAL | Status: DC
  Administered 2017-03-23 – 2017-03-26 (×7): 0.6 mg via ORAL
  Filled 2017-03-23 (×7): qty 1

## 2017-03-23 NOTE — Plan of Care (Signed)
  Education: Ability to demonstrate management of disease process will improve 03/23/2017 1227 - Progressing by Leonette Monarch, RN   Activity: Capacity to carry out activities will improve 03/23/2017 1227 - Progressing by Leonette Monarch, RN

## 2017-03-23 NOTE — Care Management Note (Signed)
Case Management Note  Patient Details  Name: Roberto [name] MRN: [id number] Date of Birth: October 18, 1965  Subjective/Objective:   CHF                Action/Plan: Primary Care Provider: [name], MD; has private insurance with [facility] with prescription drug coverage; CM following for progression of care.  Expected Discharge Date:   possibly 03/27/2017               Expected Discharge Plan:  Home/Self Care  Discharge planning Services  CM Consult  Status of Service:  In process, will continue to follow  Reola Mosher [phone] 03/23/2017, 12:48 PM

## 2017-03-23 NOTE — Progress Notes (Addendum)
Advanced Heart Failure Rounding Note  PCP-Cardiologist: Arvilla Meres, MD   Subjective:    S/P outpatient TEE/DCCV 3/8. Remains in NSR.  -960 mls and down 1 lb overnight with 80 mg IV lasix BID. Creatinine 1.2  No CP. SOB with activity. No bleeding or dizziness.   Objective:   Weight Range: 294 lb 8 oz (133.6 kg) Body mass index is 46.13 kg/m.   Vital Signs:   Temp:  [98.4 F (36.9 C)-99.6 F (37.6 C)] 99.3 F (37.4 C) (03/11 0639) Pulse Rate:  [55-88] 88 (03/11 0639) Resp:  [18] 18 (03/11 0639) BP: (104-107)/(51-74) 104/51 (03/11 0639) SpO2:  [92 %-97 %] 92 % (03/11 0639) Weight:  [294 lb 8 oz (133.6 kg)] 294 lb 8 oz (133.6 kg) (03/11 0639) Last BM Date: 03/20/17(Per patient.)  Weight change: Filed Weights   03/21/17 0500 03/22/17 0511 03/23/17 0639  Weight: (!) 300 lb 14.4 oz (136.5 kg) 295 lb 11.2 oz (134.1 kg) 294 lb 8 oz (133.6 kg)    Intake/Output:   Intake/Output Summary (Last 24 hours) at 03/23/2017 0846 Last data filed at 03/23/2017 0725 Gross per 24 hour  Intake 240 ml  Output 1600 ml  Net -1360 ml      Physical Exam    General:  Well appearing. No resp difficulty HEENT: Normal Neck: Supple. JVP ~10. Carotids 2+ bilat; no bruits. No lymphadenopathy or thyromegaly appreciated. Cor: PMI nondisplaced. Regular rate & rhythm. No rubs, gallops or murmurs. Lungs: fine crackles RLL, on 4 L [place] Abdomen: obese, soft, nontender, nondistended. No hepatosplenomegaly. No bruits or masses. Good bowel sounds. Extremities: No cyanosis, clubbing, rash, +1-2 BLE edema Neuro: Alert & orientedx3, cranial nerves grossly intact. moves all 4 extremities w/o difficulty. Affect pleasant   Telemetry   NSR with IVCD. Few runs of 5 beats NSVT.  EKG    No new tracings.   Labs    CBC Recent Labs    03/20/17 2352 03/23/17 0453  WBC 11.8* 14.0*  NEUTROABS  --  10.2*  HGB 14.6 13.4  HCT 45.8 42.3  MCV 90.7 91.2  PLT 197 174   Basic Metabolic  Panel Recent Labs    03/22/17 0113 03/23/17 0453  NA 139 137  K 3.6 3.3*  CL 100* 98*  CO2 29 28  GLUCOSE 99 100*  BUN 13 12  CREATININE 1.40* 1.23  CALCIUM 8.4* 8.3*  MG 1.9  --    Liver Function Tests No results for input(s): AST, ALT, ALKPHOS, BILITOT, PROT, ALBUMIN in the last 72 hours. No results for input(s): LIPASE, AMYLASE in the last 72 hours. Cardiac Enzymes Recent Labs    03/21/17 0420 03/21/17 0929 03/21/17 1539  TROPONINI 0.03* 0.03* 0.03*    BNP: BNP (last 3 results) Recent Labs    10/14/16 0740 03/20/17 2352  BNP 210.2* 345.8*    ProBNP (last 3 results) No results for input(s): PROBNP in the last 8760 hours.   D-Dimer No results for input(s): DDIMER in the last 72 hours. Hemoglobin A1C No results for input(s): HGBA1C in the last 72 hours. Fasting Lipid Panel No results for input(s): CHOL, HDL, LDLCALC, TRIG, CHOLHDL, LDLDIRECT in the last 72 hours. Thyroid Function Tests No results for input(s): TSH, T4TOTAL, T3FREE, THYROIDAB in the last 72 hours.  Invalid input(s): FREET3  Other results:   Imaging    No results found.   Medications:     Scheduled Medications: . amiodarone  400 mg Oral BID  . apixaban  5  mg Oral BID  . ciprofloxacin  500 mg Oral BID  . digoxin  0.125 mg Oral Daily  . furosemide  80 mg Intravenous BID  . magnesium oxide  400 mg Oral Daily  . omega-3 acid ethyl esters  1 g Oral Daily  . sodium chloride flush  3 mL Intravenous Q12H  . spironolactone  12.5 mg Oral Daily    Infusions: . sodium chloride      PRN Medications: sodium chloride, acetaminophen, ondansetron (ZOFRAN) IV, sodium chloride flush    Patient Profile   [name] is a 52 yo AA male with h/o systolic HF due to NICM (EF25-30% 2013 ), h/o recurrent DVT with resultant PE and chronic venous insufficiency, h/o LV thrombus (on Coumadin), PSVT (elected for medical therapy over RFA on EP follow-up), atrial flutter on Eliquis (s/p AFL ablation  11/2016 and DCCV 10/2016, 03/2017), and morbid obesity.   Admitted 3/8 for evaluation and treatment A/C systolic HF exacerbation.   Assessment/Plan   1. A/C systolic HF exacerbation: due to NICM, Echo 10/2016: EF 15% (drop from 40% r/t tachy induced CM). Dry weight in was October 279 lbs. RHC 10/2016: Fick CI 2.1. TEE 03/2017: dilated EF 15-20% with severe global hypokinesis, severe reduced RV function, massively dilated LA. No device.  - Volume status elevated.  - Continue 80 mg IV lasix BID. On torsemide 40 mg BID at home.  - Continue spiro 12.5 mg daily - Continue digoxin 0.125 mg daily - Home losartan 25 mg daily on hold with low BP and increased creatinine. SBP 100's. Consider restarting low dose tonight.  - No BB with low output/decompensation - On 5 L nasal cannula. Wean as tolerated - Apply TED hose  2. Atrial flutter s/p TEE/DCCV 3/8. AFL ablation 11/2016 - Remains in NSR  - Continue amiodarone 400 mg BID - Continue Eliquis. No s/s bleeding. - Needs to be set up for outpatient sleep study  3. Chest pain - His last angiogram in 2013 showed no CAD.  - EKG unremarkable. Troponins flat at 0.03. Likely r/t demand ishcemia - No further CP.   4. AKI: peak at 1.4, baseline is 1.0-1.1 - Creatinine 1.23 this am - Monitor daily BMET with diuresis  5. Hx LV thrombus and DVT. Recently switched from coumadin to Eliquis (INR subtherapeutic on coumadin) -Continue Eliquis. No s/s bleeding  6. Recent UTI: was started on cipro as outpatient for UTI. Symptoms have improved - Monitor QTC - WBC up to 14 today, tmax 99.6 - Check blood cultures  7. Pulmonary nodule: 9 mm RUL incidentally noted on CT -Will need repeat CT at 6-12 months  8. Obesity - On contrave at home.   9. Hypokalemia - K 3.3 this am. Will supp.  10. NSVT - Will supplement potassium. - Check mag level  Length of Stay: 2  Roberto Highland, NP  03/23/2017, 8:46 AM  Advanced Heart Failure Team Pager [phone]  (M-F; 7a - 4p)  Please contact [facility] for night-coverage after hours (4p -7a ) and weekends on [email or web address]  Patient seen with NP, agree with the above note.  On exam, he remains volume overloaded but is feeling better.  - Will add metolazone 2.5 mg today.   Suspect gout right great toe, add colchicine.   Will likely need a couple more days diuresis.   Marca Ancona 03/23/2017 2:08 PM

## 2017-03-23 NOTE — Anesthesia Postprocedure Evaluation (Signed)
Anesthesia Post Note  Patient: Roberto [name]  Procedure(s) Performed: TRANSESOPHAGEAL ECHOCARDIOGRAM (TEE) (N/A ) CARDIOVERSION (N/A )     Patient location during evaluation: PACU Anesthesia Type: General Level of consciousness: awake and alert Pain management: pain level controlled Vital Signs Assessment: post-procedure vital signs reviewed and stable Respiratory status: spontaneous breathing, nonlabored ventilation, respiratory function stable and patient connected to nasal cannula oxygen Cardiovascular status: blood pressure returned to baseline and stable Postop Assessment: no apparent nausea or vomiting Anesthetic complications: no    Last Vitals:  Vitals:   03/20/17 1510 03/20/17 1520  BP: 118/67 (!) 113/54  Pulse:    Resp: (!) 21 (!) 27  Temp:    SpO2: 97% 98%    Last Pain:  Vitals:   03/20/17 1439  TempSrc: Oral                 Shantay Sonn S

## 2017-03-23 NOTE — Discharge Instructions (Signed)

## 2017-03-24 ENCOUNTER — Inpatient Hospital Stay (HOSPITAL_COMMUNITY): Payer: BC Managed Care – PPO

## 2017-03-24 DIAGNOSIS — I480 Paroxysmal atrial fibrillation: Secondary | ICD-10-CM

## 2017-03-24 DIAGNOSIS — I5041 Acute combined systolic (congestive) and diastolic (congestive) heart failure: Secondary | ICD-10-CM

## 2017-03-24 LAB — BASIC METABOLIC PANEL WITH GFR
Anion gap: 11 (ref 5–15)
BUN: 15 mg/dL (ref 6–20)
CO2: 30 mmol/L (ref 22–32)
Calcium: 8.3 mg/dL — ABNORMAL LOW (ref 8.9–10.3)
Chloride: 95 mmol/L — ABNORMAL LOW (ref 101–111)
Creatinine, Ser: 1.37 mg/dL — ABNORMAL HIGH (ref 0.61–1.24)
GFR calc Af Amer: >60 mL/min
GFR calc non Af Amer: 58 mL/min — ABNORMAL LOW
Glucose, Bld: 113 mg/dL — ABNORMAL HIGH (ref 65–99)
Potassium: 3.4 mmol/L — ABNORMAL LOW (ref 3.5–5.1)
Sodium: 136 mmol/L (ref 135–145)

## 2017-03-24 LAB — CBC WITH DIFFERENTIAL/PLATELET
Basophils Absolute: 0 K/uL (ref 0.0–0.1)
Basophils Relative: 0 %
Eosinophils Absolute: 0.2 K/uL (ref 0.0–0.7)
Eosinophils Relative: 1 %
HCT: 44.3 % (ref 39.0–52.0)
Hemoglobin: 14 g/dL (ref 13.0–17.0)
Lymphocytes Relative: 16 %
Lymphs Abs: 2.5 K/uL (ref 0.7–4.0)
MCH: 28.6 pg (ref 26.0–34.0)
MCHC: 31.6 g/dL (ref 30.0–36.0)
MCV: 90.6 fL (ref 78.0–100.0)
Monocytes Absolute: 1.6 K/uL — ABNORMAL HIGH (ref 0.1–1.0)
Monocytes Relative: 10 %
Neutro Abs: 11.4 K/uL — ABNORMAL HIGH (ref 1.7–7.7)
Neutrophils Relative %: 73 %
Platelets: 175 K/uL (ref 150–400)
RBC: 4.89 MIL/uL (ref 4.22–5.81)
RDW: 14.2 % (ref 11.5–15.5)
WBC: 15.7 K/uL — ABNORMAL HIGH (ref 4.0–10.5)

## 2017-03-24 LAB — URIC ACID: Uric Acid, Serum: 9.3 mg/dL — ABNORMAL HIGH (ref 4.4–7.6)

## 2017-03-24 LAB — INFLUENZA PANEL BY PCR (TYPE A & B)
INFLAPCR: NEGATIVE
Influenza B By PCR: NEGATIVE

## 2017-03-24 MED ORDER — OXYCODONE HCL 5 MG PO TABS
5.0000 mg | ORAL_TABLET | ORAL | Status: DC | PRN
  Administered 2017-03-24: 5 mg via ORAL
  Filled 2017-03-24: qty 1

## 2017-03-24 MED ORDER — POTASSIUM CHLORIDE CRYS ER 20 MEQ PO TBCR
40.0000 meq | EXTENDED_RELEASE_TABLET | Freq: Once | ORAL | Status: AC
  Administered 2017-03-24: 40 meq via ORAL
  Filled 2017-03-24: qty 2

## 2017-03-24 MED ORDER — METOLAZONE 2.5 MG PO TABS
2.5000 mg | ORAL_TABLET | Freq: Once | ORAL | Status: AC
  Administered 2017-03-24: 2.5 mg via ORAL
  Filled 2017-03-24: qty 1

## 2017-03-24 MED ORDER — POTASSIUM CHLORIDE CRYS ER 20 MEQ PO TBCR
20.0000 meq | EXTENDED_RELEASE_TABLET | Freq: Two times a day (BID) | ORAL | Status: DC
  Administered 2017-03-24: 20 meq via ORAL
  Filled 2017-03-24: qty 1

## 2017-03-24 NOTE — Progress Notes (Addendum)
Advanced Heart Failure Rounding Note  PCP-Cardiologist: Arvilla Meres, MD   Subjective:    S/P outpatient TEE/DCCV 3/8. Remains in NSR  -4.3 liters and down 2 lbs with 80 mg IV lasix BID + 2.5 metolazone. Creatinine 1.23 > 1.37  Tmax 100.2 overnight. WBC 14 > 15.7. SBP 90-100's. Still requiring 5 L [place]. Blood cultures pending.  No CP. SOB is improved. But still weak. No orthopnea or PND. No dizziness or bleeding. Right foot and great toe still painful.    Objective:   Weight Range: 292 lb 1.6 oz (132.5 kg) Body mass index is 45.75 kg/m.   Vital Signs:   Temp:  [98.6 F (37 C)-100.2 F (37.9 C)] 98.6 F (37 C) (03/12 0451) Pulse Rate:  [64-83] 64 (03/12 0451) Resp:  [18] 18 (03/12 0451) BP: (93-108)/(42-60) 93/42 (03/12 0451) SpO2:  [92 %-95 %] 94 % (03/12 0451) Weight:  [292 lb 1.6 oz (132.5 kg)] 292 lb 1.6 oz (132.5 kg) (03/12 0451) Last BM Date: 03/22/17  Weight change: Filed Weights   03/22/17 0511 03/23/17 0639 03/24/17 0451  Weight: 295 lb 11.2 oz (134.1 kg) 294 lb 8 oz (133.6 kg) 292 lb 1.6 oz (132.5 kg)    Intake/Output:   Intake/Output Summary (Last 24 hours) at 03/24/2017 0756 Last data filed at 03/24/2017 0451 Gross per 24 hour  Intake 480 ml  Output 4400 ml  Net -3920 ml      Physical Exam   General: Fatigued appearing. No resp difficulty. HEENT: Normal Neck: Supple. JVP to jaw Carotids 2+ bilat; no bruits. No thyromegaly or nodule noted. Cor: PMI nondisplaced. RRR, No M/G/R noted Lungs: diminished throughout, on 5 L [place] Abdomen: Obese Soft, non-tender, non-distended, no HSM. No bruits or masses. +BS  Extremities: No cyanosis, clubbing, or rash. R and LLE +1-2 edema, red, warm, and tender at first MTP joint on right Neuro: Alert & orientedx3, cranial nerves grossly intact. moves all 4 extremities w/o difficulty. Affect pleasant   Telemetry   NSR 70's with frequent PVC's (8-13/min). Personally reviewed.   EKG    No new tracings.    Labs    CBC Recent Labs    03/23/17 0453 03/24/17 0242  WBC 14.0* 15.7*  NEUTROABS 10.2* 11.4*  HGB 13.4 14.0  HCT 42.3 44.3  MCV 91.2 90.6  PLT 174 175   Basic Metabolic Panel Recent Labs    16/10/96 0113 03/23/17 0453 03/24/17 0242  NA 139 137 136  K 3.6 3.3* 3.4*  CL 100* 98* 95*  CO2 29 28 30   GLUCOSE 99 100* 113*  BUN 13 12 15   CREATININE 1.40* 1.23 1.37*  CALCIUM 8.4* 8.3* 8.3*  MG 1.9 1.9  --    Liver Function Tests No results for input(s): AST, ALT, ALKPHOS, BILITOT, PROT, ALBUMIN in the last 72 hours. No results for input(s): LIPASE, AMYLASE in the last 72 hours. Cardiac Enzymes Recent Labs    03/21/17 0929 03/21/17 1539  TROPONINI 0.03* 0.03*    BNP: BNP (last 3 results) Recent Labs    10/14/16 0740 03/20/17 2352  BNP 210.2* 345.8*    ProBNP (last 3 results) No results for input(s): PROBNP in the last 8760 hours.   D-Dimer No results for input(s): DDIMER in the last 72 hours. Hemoglobin A1C No results for input(s): HGBA1C in the last 72 hours. Fasting Lipid Panel No results for input(s): CHOL, HDL, LDLCALC, TRIG, CHOLHDL, LDLDIRECT in the last 72 hours. Thyroid Function Tests No results for input(s):  TSH, T4TOTAL, T3FREE, THYROIDAB in the last 72 hours.  Invalid input(s): FREET3  Other results:   Imaging    No results found.   Medications:     Scheduled Medications: . amiodarone  400 mg Oral BID  . apixaban  5 mg Oral BID  . ciprofloxacin  500 mg Oral BID  . colchicine  0.6 mg Oral BID  . digoxin  0.125 mg Oral Daily  . furosemide  80 mg Intravenous BID  . losartan  12.5 mg Oral QHS  . magnesium oxide  400 mg Oral Daily  . omega-3 acid ethyl esters  1 g Oral Daily  . sodium chloride flush  3 mL Intravenous Q12H  . spironolactone  12.5 mg Oral Daily    Infusions: . sodium chloride      PRN Medications: sodium chloride, acetaminophen, ondansetron (ZOFRAN) IV, oxyCODONE, sodium chloride flush    Patient  Profile   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   Admitted 3/8 for evaluation and treatment A/C systolic HF exacerbation.   Assessment/Plan   1. A/C systolic HF exacerbation: due to NICM, Echo 10/2016: EF 15% (drop from 40% r/t tachy induced CM). RHC 10/2016: Fick CI 2.1. TEE 03/2017: dilated EF 15-20% with severe global hypokinesis, severe reduced RV function, massively dilated LA. No device. Dry weight in was October 279 lbs.  - Frequent PVC's may be contributing to his HF.  - Volume status remains elevated - Continue 80 mg IV lasix BID. On torsemide 40 mg BID at home.  - Continue spiro 12.5 mg daily - Continue digoxin 0.125 mg daily - Continue losartan 12.5 mg qHS - No BB with low output/decompensation - On 5 L nasal cannula with sats low 90's. Wean as tolerated - Apply TED hose  2. Atrial flutter s/p TEE/DCCV 3/8. AFL ablation 11/2016 - Remains in NSR - Continue amiodarone 400 mg BID - Continue Eliquis. No s/s bleeding - Needs to be set up for outpatient sleep study  3. Chest pain - His last angiogram in 2013 showed no CAD.  - EKG unremarkable. Troponins flat at 0.03. Likely r/t demand ishcemia - No s/s ischemia  4. AKI: peak at 1.4, baseline is 1.0-1.1 - Creatinine 1.37 this am - Monitor daily BMET with diuresis  5. Hx LV thrombus and DVT. Recently switched from coumadin to Eliquis (INR subtherapeutic on coumadin) -Continue Eliquis. No s/s bleeding  6. ID: was started on cipro as outpatient for UTI. Symptoms have resolved - Continue PO cipro. Monitor QTC.  - WBC 14 > 15.7 - tmax 100.2 overnight - Blood cultures pending - No cough or urinary symptoms - Will check 2view  CXR, RSV panel, flu swab with 5 L O2 requirement - ?d/t gout  7. Pulmonary nodule: 9 mm RUL incidentally noted on CT -Will need repeat CT at 6-12 months. No change  8. Obesity - On contrave at home.  - Body mass index is 45.75 kg/m.  9. Hypokalemia - K 3.4 this am. Will supp.  10. NSVT - None overnight  11. Gout - Started on colchicine yesterday - Right great toe still painful  Length of Stay: 3  Alford Highland, NP  03/24/2017, 7:56 AM  Advanced Heart Failure Team Pager [phone] (M-F; 7a - 4p)  Please contact [facility] for night-coverage after hours (4p -7a ) and weekends on [email or web address]  Patient seen and examined with the above-signed Advanced Practice Provider and/or Housestaff. I personally reviewed laboratory data, imaging studies and relevant notes. I independently examined the patient and formulated the important aspects of the plan. I have edited the note to reflect any of my changes or salient points. I have personally discussed the plan with the patient and/or family.  He remains quite tenuous. Volume status improving but still overloaded. Will continue IV lasix and give another dose of metolazone today. Supp K/  I have reviewed his TEE with him and shows severe biventricular function. I am not overly optimisticc that his EF will improve significantly with restoration of NSR. Given degree of RV dysfunction, he is not candidate for VAD support. We discussed the possibility of stating transplant w/u but currently BMI is prohibitive.    Arvilla Meres, MD  4:54 PM

## 2017-03-25 DIAGNOSIS — M1000 Idiopathic gout, unspecified site: Secondary | ICD-10-CM

## 2017-03-25 LAB — CBC WITH DIFFERENTIAL/PLATELET
Basophils Absolute: 0 10*3/uL (ref 0.0–0.1)
Basophils Relative: 0 %
EOS ABS: 0.1 10*3/uL (ref 0.0–0.7)
Eosinophils Relative: 1 %
HEMATOCRIT: 45 % (ref 39.0–52.0)
HEMOGLOBIN: 14.9 g/dL (ref 13.0–17.0)
LYMPHS ABS: 2 10*3/uL (ref 0.7–4.0)
LYMPHS PCT: 16 %
MCH: 29.9 pg (ref 26.0–34.0)
MCHC: 33.1 g/dL (ref 30.0–36.0)
MCV: 90.2 fL (ref 78.0–100.0)
MONOS PCT: 11 %
Monocytes Absolute: 1.4 10*3/uL — ABNORMAL HIGH (ref 0.1–1.0)
NEUTROS ABS: 9.1 10*3/uL — AB (ref 1.7–7.7)
NEUTROS PCT: 72 %
Platelets: 193 10*3/uL (ref 150–400)
RBC: 4.99 MIL/uL (ref 4.22–5.81)
RDW: 14.1 % (ref 11.5–15.5)
WBC: 12.6 10*3/uL — AB (ref 4.0–10.5)

## 2017-03-25 LAB — CMV IGM: CMV IgM: <30 AU/mL (ref 0.0–29.9)

## 2017-03-25 LAB — BASIC METABOLIC PANEL
Anion gap: 13 (ref 5–15)
BUN: 16 mg/dL (ref 6–20)
CHLORIDE: 92 mmol/L — AB (ref 101–111)
CO2: 31 mmol/L (ref 22–32)
CREATININE: 1.26 mg/dL — AB (ref 0.61–1.24)
Calcium: 8.5 mg/dL — ABNORMAL LOW (ref 8.9–10.3)
GFR calc Af Amer: >60 mL/min (ref >60)
GFR calc non Af Amer: >60 mL/min (ref >60)
Glucose, Bld: 103 mg/dL — ABNORMAL HIGH (ref 65–99)
Potassium: 3 mmol/L — ABNORMAL LOW (ref 3.5–5.1)
SODIUM: 136 mmol/L (ref 135–145)

## 2017-03-25 LAB — EPSTEIN-BARR VIRUS VCA, IGG

## 2017-03-25 LAB — EPSTEIN-BARR VIRUS VCA, IGM: EBV VCA IgM: <36 U/mL (ref 0.0–35.9)

## 2017-03-25 MED ORDER — PREDNISONE 20 MG PO TABS
40.0000 mg | ORAL_TABLET | Freq: Every day | ORAL | Status: DC
  Administered 2017-03-25 – 2017-03-26 (×2): 40 mg via ORAL
  Filled 2017-03-25 (×2): qty 2

## 2017-03-25 MED ORDER — FUROSEMIDE 10 MG/ML IJ SOLN
80.0000 mg | Freq: Two times a day (BID) | INTRAMUSCULAR | Status: AC
  Administered 2017-03-25: 80 mg via INTRAVENOUS
  Filled 2017-03-25: qty 8

## 2017-03-25 MED ORDER — POTASSIUM CHLORIDE CRYS ER 20 MEQ PO TBCR
40.0000 meq | EXTENDED_RELEASE_TABLET | Freq: Once | ORAL | Status: AC
  Administered 2017-03-25: 40 meq via ORAL
  Filled 2017-03-25: qty 2

## 2017-03-25 MED ORDER — POTASSIUM CHLORIDE CRYS ER 20 MEQ PO TBCR
40.0000 meq | EXTENDED_RELEASE_TABLET | Freq: Two times a day (BID) | ORAL | Status: DC
  Administered 2017-03-25 (×2): 40 meq via ORAL
  Filled 2017-03-25 (×2): qty 2

## 2017-03-25 NOTE — Progress Notes (Addendum)
Advanced Heart Failure Rounding Note  PCP-Cardiologist: Arvilla Meres, MD   Subjective:    S/P outpatient TEE/DCCV 3/8. Remains in NSR  -2.8 Liters and down 9 lbs (standing scale) with 80 mg IV lasix BID + 2.5 metolazone.   Afebrile. WBC trending down. Blood cultures NGTD.  No CP, SOB, or dizziness. Right toe feeling a little better, but unable to walk far.   Objective:   Weight Range: 283 lb 3.2 oz (128.5 kg) Body mass index is 44.36 kg/m.   Vital Signs:   Temp:  [98.5 F (36.9 C)-98.9 F (37.2 C)] 98.5 F (36.9 C) (03/13 0539) Pulse Rate:  [63-79] 63 (03/13 0539) Resp:  [18] 18 (03/13 0539) BP: (93-95)/(42-59) 94/59 (03/13 0539) SpO2:  [92 %-98 %] 98 % (03/13 0539) Weight:  [283 lb 3.2 oz (128.5 kg)] 283 lb 3.2 oz (128.5 kg) (03/13 0539) Last BM Date: 03/24/17  Weight change: Filed Weights   03/23/17 0639 03/24/17 0451 03/25/17 0539  Weight: 294 lb 8 oz (133.6 kg) 292 lb 1.6 oz (132.5 kg) 283 lb 3.2 oz (128.5 kg)    Intake/Output:   Intake/Output Summary (Last 24 hours) at 03/25/2017 0717 Last data filed at 03/25/2017 0538 Gross per 24 hour  Intake 1200 ml  Output 3950 ml  Net -2750 ml      Physical Exam   General:  No resp difficulty. HEENT: Normal Neck: Supple. JVP 9-10 Carotids 2+ bilat; no bruits. No thyromegaly or nodule noted. Cor: PMI nondisplaced. RRR, No M/G/R noted, distant heart sounds Lungs: fine crackles in bases, on 5 L [place] Abdomen: obese, soft, non-tender, non-distended, no HSM. No bruits or masses. +BS  Extremities: No cyanosis, clubbing, or rash. R and LLE +1 edema R foot sore to palpation  Neuro: Alert & orientedx3, cranial nerves grossly intact. moves all 4 extremities w/o difficulty. Affect pleasant   Telemetry   NSR with 1st degree AV block and frequent PVC's (8-15/min). QTC 470 Personally reviewed.   EKG    No new tracings.   Labs    CBC Recent Labs    03/24/17 0242 03/25/17 0437  WBC 15.7* 12.6*  NEUTROABS  11.4* 9.1*  HGB 14.0 14.9  HCT 44.3 45.0  MCV 90.6 90.2  PLT 175 193   Basic Metabolic Panel Recent Labs    16/10/96 0453 03/24/17 0242 03/25/17 0437  NA 137 136 136  K 3.3* 3.4* 3.0*  CL 98* 95* 92*  CO2 28 30 31   GLUCOSE 100* 113* 103*  BUN 12 15 16   CREATININE 1.23 1.37* 1.26*  CALCIUM 8.3* 8.3* 8.5*  MG 1.9  --   --    Liver Function Tests No results for input(s): AST, ALT, ALKPHOS, BILITOT, PROT, ALBUMIN in the last 72 hours. No results for input(s): LIPASE, AMYLASE in the last 72 hours. Cardiac Enzymes No results for input(s): CKTOTAL, CKMB, CKMBINDEX, TROPONINI in the last 72 hours.  BNP: BNP (last 3 results) Recent Labs    10/14/16 0740 03/20/17 2352  BNP 210.2* 345.8*    ProBNP (last 3 results) No results for input(s): PROBNP in the last 8760 hours.   D-Dimer No results for input(s): DDIMER in the last 72 hours. Hemoglobin A1C No results for input(s): HGBA1C in the last 72 hours. Fasting Lipid Panel No results for input(s): CHOL, HDL, LDLCALC, TRIG, CHOLHDL, LDLDIRECT in the last 72 hours. Thyroid Function Tests No results for input(s): TSH, T4TOTAL, T3FREE, THYROIDAB in the last 72 hours.  Invalid input(s): FREET3  Other results:   Imaging    Dg Chest 2 View  Result Date: 03/24/2017 CLINICAL DATA:  Fever.  Oxygen desaturation. EXAM: CHEST - 2 VIEW COMPARISON:  03/21/2017 chest radiograph. FINDINGS: Stable cardiomediastinal silhouette with mild cardiomegaly. No pneumothorax. No pleural effusion. Worsening prominent patchy right upper parahilar lung opacities. IMPRESSION: Worsening prominent patchy right upper parahilar lung opacities with mild cardiomegaly, favoring asymmetric pulmonary edema. Close chest radiograph follow-up advised. Electronically Signed   By: Delbert Phenix M.D.   On: 03/24/2017 11:16     Medications:     Scheduled Medications: . amiodarone  400 mg Oral BID  . apixaban  5 mg Oral BID  . ciprofloxacin  500 mg Oral BID  .  colchicine  0.6 mg Oral BID  . digoxin  0.125 mg Oral Daily  . furosemide  80 mg Intravenous BID  . losartan  12.5 mg Oral QHS  . magnesium oxide  400 mg Oral Daily  . omega-3 acid ethyl esters  1 g Oral Daily  . potassium chloride  20 mEq Oral BID  . sodium chloride flush  3 mL Intravenous Q12H  . spironolactone  12.5 mg Oral Daily    Infusions: . sodium chloride      PRN Medications: sodium chloride, acetaminophen, ondansetron (ZOFRAN) IV, oxyCODONE, sodium chloride flush    Patient Profile   Roberto [name] is a 52 yo AA male with h/o systolic HF due to NICM (EF25-30% 2013 ), h/o recurrent DVT with resultant PE and chronic venous insufficiency, h/o LV thrombus (on Coumadin), PSVT (elected for medical therapy over RFA on EP follow-up), atrial flutter on Eliquis (s/p AFL ablation 11/2016 and DCCV 10/2016, 03/2017), and morbid obesity.   Admitted 3/8 for evaluation and treatment A/C systolic HF exacerbation.   Assessment/Plan   1. A/C systolic HF exacerbation: due to NICM, Echo 10/2016: EF 15% (drop from 40% r/t tachy induced CM). RHC 10/2016: Fick CI 2.1. TEE 03/2017: dilated EF 15-20% with severe global hypokinesis, severe reduced RV function, massively dilated LA. No device. Dry weight in was October 279 lbs.  - Frequent PVC's may be contributing to his HF.  - Volume status remains elevated. - Continue 80 mg IV lasix BID. On torsemide 40 mg BID at home.  - Continue spiro 12.5 mg daily. Consider increasing, but SBP 90's - Continue digoxin 0.125 mg daily - Continue losartan 12.5 mg qHS. No BP room to increse - No BB with low output/decompensation - On 5 L nasal cannula with sats mid to upper 90's. Wean as tolerated.  - Apply TED hose - Not a VAD candidate due to severe RV dysfunction. Would need to lose weight for consideration of heart transplant. Dr. Gala Romney discussed this with him.   2. Atrial flutter s/p TEE/DCCV 3/8. AFL ablation 11/2016 - Remains in NSR - Continue  amiodarone 400 mg BID - Continue Eliquis. No s/s bleeding - Needs to be set up for outpatient sleep study  3. Chest pain - His last angiogram in 2013 showed no CAD.  - EKG unremarkable. Troponins flat at 0.03. Likely r/t demand ishcemia - No s/s ischemia.   4. AKI: peak at 1.4, baseline is 1.0-1.1 - Creatinine 1.26 this am - Monitor daily BMET with diuresis  5. Hx LV thrombus and DVT. Recently switched from coumadin to Eliquis (INR subtherapeutic on coumadin) -Continue Eliquis. No s/s bleeding  6. ID: was started on cipro as outpatient for UTI. Symptoms have resolved - Continue PO cipro. QTC 470 on tele  -  WBC 14 > 15.7 > 12.6 - Afebrile overnight, blood cultures NGTD - CXR 3/12 showed asymmetric pulmonary edema with worsening RU parahilar lung opacities  - Flu swab negative, RSV pending.   7. Pulmonary nodule: 9 mm RUL incidentally noted on CT -Will need repeat CT at 6-12 months. No change.   8. Obesity - On contrave at home.  - Body mass index is 44.36 kg/m. - Needs to lose considerable weight to be considered for heart transplant down the road.   9. Hypokalemia - K 3.0 this am. Will supp.  10. NSVT - None overnight  11. Gout - Continue colchicine.  - Right great toe still painful, but improving - Uric acid elevated to 9.3  Length of Stay: 4  Alford Highland, NP  03/25/2017, 7:17 AM  Advanced Heart Failure Team Pager [phone] (M-F; 7a - 4p)  Please contact [facility] for night-coverage after hours (4p -7a ) and weekends on [email or web address]  Patient seen and examined with the above-signed Advanced Practice Provider and/or Housestaff. I personally reviewed laboratory data, imaging studies and relevant notes. I independently examined the patient and formulated the important aspects of the plan. I have edited the note to reflect any of my changes or salient points. I have personally discussed the plan with the patient and/or family.  He is diuresing well despite  severe biventricular dysfunction. Suspect he is nearing euvolemia. Continue IV lasix one more day. Maintaining NSR. Continue amio. Start steroids for acute gout. Can stop abx for UTI. Medication changes discussed with PharmD personally. Possibly home in am.   Arvilla Meres, MD  9:46 PM

## 2017-03-26 LAB — BASIC METABOLIC PANEL
ANION GAP: 14 (ref 5–15)
BUN: 25 mg/dL — AB (ref 6–20)
CO2: 27 mmol/L (ref 22–32)
Calcium: 8.7 mg/dL — ABNORMAL LOW (ref 8.9–10.3)
Chloride: 93 mmol/L — ABNORMAL LOW (ref 101–111)
Creatinine, Ser: 1.16 mg/dL (ref 0.61–1.24)
GFR calc Af Amer: >60 mL/min (ref >60)
Glucose, Bld: 115 mg/dL — ABNORMAL HIGH (ref 65–99)
POTASSIUM: 3.9 mmol/L (ref 3.5–5.1)
SODIUM: 134 mmol/L — AB (ref 135–145)

## 2017-03-26 LAB — CBC WITH DIFFERENTIAL/PLATELET
BASOS ABS: 0 10*3/uL (ref 0.0–0.1)
Basophils Relative: 0 %
Eosinophils Absolute: 0 10*3/uL (ref 0.0–0.7)
Eosinophils Relative: 0 %
HCT: 44 % (ref 39.0–52.0)
Hemoglobin: 14.8 g/dL (ref 13.0–17.0)
LYMPHS ABS: 0.9 10*3/uL (ref 0.7–4.0)
Lymphocytes Relative: 6 %
MCH: 29.8 pg (ref 26.0–34.0)
MCHC: 33.6 g/dL (ref 30.0–36.0)
MCV: 88.7 fL (ref 78.0–100.0)
MONO ABS: 1.5 10*3/uL — AB (ref 0.1–1.0)
Monocytes Relative: 10 %
Neutro Abs: 12.5 10*3/uL — ABNORMAL HIGH (ref 1.7–7.7)
Neutrophils Relative %: 84 %
PLATELETS: 216 10*3/uL (ref 150–400)
RBC: 4.96 MIL/uL (ref 4.22–5.81)
RDW: 13.6 % (ref 11.5–15.5)
WBC: 14.9 10*3/uL — AB (ref 4.0–10.5)

## 2017-03-26 LAB — RSV(RESPIRATORY SYNCYTIAL VIRUS) AB, BLOOD: RSV AB: NEGATIVE

## 2017-03-26 MED ORDER — CEPHALEXIN 500 MG PO CAPS: 500.0000 mg | ORAL_CAPSULE | Freq: Three times a day (TID) | ORAL | 0 refills | Status: DC

## 2017-03-26 MED ORDER — CEPHALEXIN 500 MG PO CAPS
500.0000 mg | ORAL_CAPSULE | Freq: Three times a day (TID) | ORAL | Status: DC
  Administered 2017-03-26: 500 mg via ORAL
  Filled 2017-03-26: qty 1

## 2017-03-26 MED ORDER — LOSARTAN POTASSIUM 25 MG PO TABS: 12.5000 mg | ORAL_TABLET | Freq: Every day | ORAL | 6 refills | Status: DC

## 2017-03-26 MED ORDER — TORSEMIDE 20 MG PO TABS
40.0000 mg | ORAL_TABLET | Freq: Two times a day (BID) | ORAL | Status: DC
  Administered 2017-03-26 (×2): 40 mg via ORAL
  Filled 2017-03-26 (×2): qty 2

## 2017-03-26 MED ORDER — POTASSIUM CHLORIDE CRYS ER 20 MEQ PO TBCR
20.0000 meq | EXTENDED_RELEASE_TABLET | Freq: Two times a day (BID) | ORAL | Status: DC
  Administered 2017-03-26: 20 meq via ORAL
  Filled 2017-03-26 (×2): qty 1

## 2017-03-26 MED ORDER — PREDNISONE 20 MG PO TABS: 40.0000 mg | ORAL_TABLET | Freq: Every day | ORAL | 0 refills | Status: DC

## 2017-03-26 NOTE — Progress Notes (Addendum)
Advanced Heart Failure Rounding Note  PCP-Cardiologist: Arvilla Meres, MD   Subjective:    S/P outpatient TEE/DCCV 3/8. Remains in NSR.  -1.7 liters and down 3 lbs with 80 mg IV lasix BID. Creatinine 1.16. O2 weaned to 2 L.   Gout pain much improved with steroids. Able to walk halls.   No CP, SOB, dizziness. Feels back to normal.   Objective:   Weight Range: 280 lb 1.6 oz (127.1 kg) Body mass index is 43.87 kg/m.   Vital Signs:   Temp:  [97.6 F (36.4 C)-98.7 F (37.1 C)] 97.6 F (36.4 C) (03/14 0623) Pulse Rate:  [64-72] 64 (03/14 0623) Resp:  [14-18] 14 (03/14 0623) BP: (88-104)/(52-67) 104/67 (03/14 0623) SpO2:  [91 %-95 %] 94 % (03/14 0623) Weight:  [280 lb 1.6 oz (127.1 kg)] 280 lb 1.6 oz (127.1 kg) (03/14 0623) Last BM Date: 03/25/17  Weight change: Filed Weights   03/24/17 0451 03/25/17 0539 03/26/17 0623  Weight: 292 lb 1.6 oz (132.5 kg) 283 lb 3.2 oz (128.5 kg) 280 lb 1.6 oz (127.1 kg)    Intake/Output:   Intake/Output Summary (Last 24 hours) at 03/26/2017 0740 Last data filed at 03/26/2017 1610 Gross per 24 hour  Intake 963 ml  Output 2750 ml  Net -1787 ml      Physical Exam   General: Well appearing. No resp difficulty. HEENT: Normal Neck: Supple. JVP ~8. Carotids 2+ bilat; no bruits. No thyromegaly or nodule noted. Cor: PMI nondisplaced. RRR, No M/G/R noted Lungs: diminished throughout, on 2 L [place] Abdomen: Soft, non-tender, non-distended, no HSM. No bruits or masses. +BS  Extremities: No cyanosis, clubbing, or rash. R and LLE trace edema. Neuro: Alert & orientedx3, cranial nerves grossly intact. moves all 4 extremities w/o difficulty. Affect pleasant   Telemetry   NSR 60's. Personally reviewed.   EKG    No new tracings.   Labs    CBC Recent Labs    03/24/17 0242 03/25/17 0437  WBC 15.7* 12.6*  NEUTROABS 11.4* 9.1*  HGB 14.0 14.9  HCT 44.3 45.0  MCV 90.6 90.2  PLT 175 193   Basic Metabolic Panel Recent Labs   03/25/17 0437 03/26/17 0555  NA 136 134*  K 3.0* 3.9  CL 92* 93*  CO2 31 27  GLUCOSE 103* 115*  BUN 16 25*  CREATININE 1.26* 1.16  CALCIUM 8.5* 8.7*   Liver Function Tests No results for input(s): AST, ALT, ALKPHOS, BILITOT, PROT, ALBUMIN in the last 72 hours. No results for input(s): LIPASE, AMYLASE in the last 72 hours. Cardiac Enzymes No results for input(s): CKTOTAL, CKMB, CKMBINDEX, TROPONINI in the last 72 hours.  BNP: BNP (last 3 results) Recent Labs    10/14/16 0740 03/20/17 2352  BNP 210.2* 345.8*    ProBNP (last 3 results) No results for input(s): PROBNP in the last 8760 hours.   D-Dimer No results for input(s): DDIMER in the last 72 hours. Hemoglobin A1C No results for input(s): HGBA1C in the last 72 hours. Fasting Lipid Panel No results for input(s): CHOL, HDL, LDLCALC, TRIG, CHOLHDL, LDLDIRECT in the last 72 hours. Thyroid Function Tests No results for input(s): TSH, T4TOTAL, T3FREE, THYROIDAB in the last 72 hours.  Invalid input(s): FREET3  Other results:   Imaging    No results found.   Medications:     Scheduled Medications: . amiodarone  400 mg Oral BID  . apixaban  5 mg Oral BID  . colchicine  0.6 mg Oral BID  .  digoxin  0.125 mg Oral Daily  . losartan  12.5 mg Oral QHS  . magnesium oxide  400 mg Oral Daily  . omega-3 acid ethyl esters  1 g Oral Daily  . potassium chloride  40 mEq Oral BID  . predniSONE  40 mg Oral Q breakfast  . sodium chloride flush  3 mL Intravenous Q12H  . spironolactone  12.5 mg Oral Daily    Infusions: . sodium chloride      PRN Medications: sodium chloride, acetaminophen, ondansetron (ZOFRAN) IV, oxyCODONE, sodium chloride flush    Patient Profile   Roberto [name] is a 52 yo AA male with h/o systolic HF due to NICM (EF25-30% 2013 ), h/o recurrent DVT with resultant PE and chronic venous insufficiency, h/o LV thrombus (on Coumadin), PSVT (elected for medical therapy over RFA on EP follow-up), atrial  flutter on Eliquis (s/p AFL ablation 11/2016 and DCCV 10/2016, 03/2017), and morbid obesity.   Admitted 3/8 for evaluation and treatment A/C systolic HF exacerbation.   Assessment/Plan   1. A/C systolic HF exacerbation: due to NICM, Echo 10/2016: EF 15% (drop from 40% r/t tachy induced CM). RHC 10/2016: Fick CI 2.1. TEE 03/2017: dilated EF 15-20% with severe global hypokinesis, severe reduced RV function, massively dilated LA. No device. Dry weight in was October 279 lbs.  - Volume status stable.  - Resume home torsemide 40 mg BID - Continue spiro 12.5 mg daily.  - Continue digoxin 0.125 mg daily - Continue losartan 12.5 mg qHS.  - No BB with low output/decompensation - On 2 L nasal cannula. Wean as tolerated. He was down to 1 L until he was sleeping. - Not a VAD candidate due to severe RV dysfunction. Would need to lose weight for consideration of heart transplant. Dr. Gala Romney discussed this with him.  - Discussed importance of daily weights and notifying us if he has weight gain >2 lbs overnight or 5 lbs in one week.   2. Atrial flutter s/p TEE/DCCV 3/8. AFL ablation 11/2016 - Remains in NSR - Continue amiodarone 400 mg BID - Continue Eliquis. No s/s bleeding. - Needs to be set up for outpatient sleep study  3. Chest pain - His last angiogram in 2013 showed no CAD.  - EKG unremarkable. Troponins flat at 0.03. Likely r/t demand ishcemia - No s/s ischemia.   4. AKI: peak at 1.4, baseline is 1.0-1.1 - Creatinine 1.16 this am - Monitor daily BMET with diuresis  5. Hx LV thrombus and DVT. Recently switched from coumadin to Eliquis (INR subtherapeutic on coumadin) -Continue Eliquis. No s/s bleeding  6. ID: was started on cipro as outpatient for UTI. Symptoms have resolved - Completed Cipro. - WBC 14 > 15.7 > 12.6 > 14.9 - Afebrile, blood cultures NGTD - CXR 3/12 showed asymmetric pulmonary edema with worsening RU parahilar lung opacities  - Flu swab negative, RSV + for  mono.  7. Pulmonary nodule: 9 mm RUL incidentally noted on CT -Will need repeat CT at 6-12 months. No change.   8. Obesity - On contrave at home.  - Body mass index is 43.87 kg/m. - Needs to lose considerable weight to be considered for heart transplant down the road.   9. Hypokalemia - K 3.9 this am.   10. NSVT - None overnight.   11. Gout - Continue colchicine and prednisone - Right great toe pain improved. - Uric acid elevated to 9.3  He has follow up arranged for next week.  Will order cardiac rehab.  Spoke with RN about weaning O2/documenting if unable.   Length of Stay: 5  Roberto Highland, NP  03/26/2017, 7:40 AM  Advanced Heart Failure Team Pager [phone] (M-F; 7a - 4p)  Please contact [facility] for night-coverage after hours (4p -7a ) and weekends on [email or web address]  Patient seen and examined with the above-signed Advanced Practice Provider and/or Housestaff. I personally reviewed laboratory data, imaging studies and relevant notes. I independently examined the patient and formulated the important aspects of the plan. I have edited the note to reflect any of my changes or salient points. I have personally discussed the plan with the patient and/or family.  Maintaining NSR. Volume status much improved. Now euvolemic. Gout pain improved with steroids. ? Small area of cellulitis on LLE. Ok for d/c today with close f/u in HF Clinic next week. If EF does not improve with maintenance of NSR will need to consider transplant eval though BMI currently prohibitive. RV too bad for VAD.   Arvilla Meres, MD  11:12 PM

## 2017-03-26 NOTE — Discharge Summary (Addendum)
Advanced Heart Failure Discharge Note  Discharge Summary   Patient ID: Roberto [name] MRN: [id number], DOB/AGE: 1965-09-15 52 y.o. Admit date: 03/20/2017 D/C date:     03/26/2017   Primary Discharge Diagnoses:  1. A/C systolic HF  2. Paroxysmal  Atrial flutter - S/P DCCV 10/2016, 03/2017 - S/P AFL ablation 11/2017 3. Chest pain 4. AKI 5. Hx LV thrombus and DVT 6. ID 7. Pulmonary nodule - Needs repeat CT 6-12 months 8. Obesity 9. Hypokalemia 10. NSVT 11. Acute Gout 12. Cellulitis of LLE   Hospital Course: AHMAD VANWEY is a 52 y.o. male with a history of ystolic HF due to NICM (EF25-30% 2013), h/o recurrent DVT with resultant PE and chronic venous insufficiency, h/o LV thrombus (on apixiban), PSVT (elected for medical therapy over RFA on EP follow-up), atrial flutter on Eliquis (s/p AFL ablation 11/2016 and DCCV 10/2016, 03/2017), and morbid obesity. He had a successful outpatient TEE/DCCV on 3/8 that showed EF 15-20% with severe global hypokinesis, severe reduced RV function, and massively dilated LA.  Mr Dimperio went home after his cardioversion on 3/8, then came back to ED with worsening SOB and CP with little response to PO torsemide. He was found to be volume overloaded. His EKG was unremarkable and troponins flat, thought to be related to demand ischemia. He was diuresed with IV lasix and metolazone and was transitioned to torsemide 40 mg BID. Heart failure medications optimized. No BB with low output. Dr Gala Romney talked to him about need to lose weight to be considered for a heart transplant down the road. He is not a candidate for LVAD with severe RV dysfunction.  A 9 mm RUL nodule was incidentally found on his chest CT. He needs a repeat CT in 6-12 months.   He remained in NSR throughout admission. Eliquis and amiodarone 400 mg BID continued.   His course was complicated by a fever and increased WBC. Blood cultures were negative. He completed antibiotics for a kidney infection  that was started outpatient. He was started on Keflex on the day of discharge for warm, red area on RLE concerning for cellulitis.    He also had gout in right great toe that improved with prednisone and colchicine.   Patient will be followed closely in the HF clinic. Follow up appointment has been made. He needs an outpatient sleep study.   Discharge Weight Range: 280 lbs.  Discharge Vitals: Blood pressure 94/60, pulse 63, temperature 97.7 F (36.5 C), temperature source Oral, resp. rate 14, height 5\' 7"  (1.702 m), weight 280 lb 1.6 oz (127.1 kg), SpO2 95 %.  Labs: Lab Results  Component Value Date   WBC 14.9 (H) 03/26/2017   HGB 14.8 03/26/2017   HCT 44.0 03/26/2017   MCV 88.7 03/26/2017   PLT 216 03/26/2017    Recent Labs  Lab 03/26/17 0555  NA 134*  K 3.9  CL 93*  CO2 27  BUN 25*  CREATININE 1.16  CALCIUM 8.7*  GLUCOSE 115*   Lab Results  Component Value Date   CHOL 107 (L) 09/18/2015   HDL 40 09/18/2015   LDLCALC 53 09/18/2015   TRIG 68 09/18/2015   BNP (last 3 results) Recent Labs    10/14/16 0740 03/20/17 2352  BNP 210.2* 345.8*    ProBNP (last 3 results) No results for input(s): PROBNP in the last 8760 hours.   Diagnostic Studies/Procedures   TEE 03/20/2017: LEFT VENTRICLE: Markedly dilated EF 15-20% with severe global HK RIGHT VENTRICLE: Dilated severely  reduced function LEFT ATRIUM: Massively dilated 7.2cm LEFT ATRIAL APPENDAGE: No clot RIGHT ATRIUM: Normal AORTIC VALVE:  Trileaflet. Trivial AI MITRAL VALVE:    Normal mild MR TRICUSPID VALVE: Normal mild TR PULMONIC VALVE: Normal no PI INTERATRIAL SEPTUM: Bulging to right due to elevated LA pressure PERICARDIUM: No effusion  DESCENDING AORTA: Mild plaque   Discharge Medications   Allergies as of 03/26/2017      Reactions   Shrimp [shellfish Allergy] Hives, Itching      Medication List    STOP taking these medications   ciprofloxacin 500 MG tablet Commonly known as:  CIPRO       TAKE these medications   acetaminophen 500 MG tablet Commonly known as:  TYLENOL Take 500-1,000 mg by mouth every 6 (six) hours as needed for moderate pain or headache.   amiodarone 400 MG tablet Commonly known as:  PACERONE Take 1 tablet (400 mg total) by mouth 2 (two) times daily.   apixaban 5 MG Tabs tablet Commonly known as:  ELIQUIS Take 1 tablet (5 mg total) by mouth 2 (two) times daily.   cephALEXin 500 MG capsule Commonly known as:  KEFLEX Take 1 capsule (500 mg total) by mouth every 8 (eight) hours.   coconut oil Oil Apply 1 application topically daily.   digoxin 0.125 MG tablet Commonly known as:  DIGOX TAKE 1 TABLET(0.125 MG) BY MOUTH DAILY   FISH OIL PO Take 1 capsule by mouth 2 (two) times daily.   losartan 25 MG tablet Commonly known as:  COZAAR Take 0.5 tablets (12.5 mg total) by mouth at bedtime. What changed:  how much to take   Magnesium 400 MG Tabs Take 400 mg by mouth daily.   Naltrexone-buPROPion HCl ER 8-90 MG Tb12 Commonly known as:  CONTRAVE Take 2 tablets by mouth 2 (two) times daily.   potassium chloride SA 20 MEQ tablet Commonly known as:  K-DUR,KLOR-CON Take 1 tablet (20 mEq total) by mouth 2 (two) times daily.   predniSONE 20 MG tablet Commonly known as:  DELTASONE Take 2 tablets (40 mg total) by mouth daily with breakfast. Start taking on:  03/27/2017   sildenafil 100 MG tablet Commonly known as:  VIAGRA Take 0.5-1 tablets (50-100 mg total) by mouth daily as needed for erectile dysfunction.   spironolactone 25 MG tablet Commonly known as:  ALDACTONE Take 0.5 tablets (12.5 mg total) by mouth daily.   Testosterone 20.25 MG/1.25GM (1.62%) Gel Commonly known as:  ANDROGEL Place 2 Squirts onto the skin daily.   torsemide 20 MG tablet Commonly known as:  DEMADEX Take 2 tablets (40 mg total) by mouth 2 (two) times daily.       Disposition   The patient will be discharged in stable condition to home. Discharge  Instructions    (HEART FAILURE PATIENTS) Call MD:  Anytime you have any of the following symptoms: 1) 3 pound weight gain in 24 hours or 5 pounds in 1 week 2) shortness of breath, with or without a dry hacking cough 3) swelling in the hands, feet or stomach 4) if you have to sleep on extra pillows at night in order to breathe.   Complete by:  As directed    Amb Referral to Cardiac Rehabilitation   Complete by:  As directed    Diagnosis:  Heart Failure (see criteria below if ordering Phase II)   Heart Failure Type:  Chronic Systolic & Diastolic   Diet - low sodium heart healthy   Complete by:  As directed    Increase activity slowly   Complete by:  As directed      Follow-up Information    [facility]. Go on 04/03/2017.   Specialty:  Cardiology Why:  8:30 AM, Advanced Heart Failure Clinic, parking code [id number] Contact information: [address] [id number] mc [place] [address] [phone]            Duration of Discharge Encounter: Greater than 35 minutes   Signed, Alford Highland, NP 03/26/2017, 4:49 PM  Patient seen and examined with the above-signed Advanced Practice Provider and/or Housestaff. I personally reviewed laboratory data, imaging studies and relevant notes. I independently examined the patient and formulated the important aspects of the plan. I have edited the note to reflect any of my changes or salient points. I have personally discussed the plan with the patient and/or family.  Maintaining NSR. Volume status much improved. Now euvolemic. Gout pain improved with steroids. ? Small area of cellulitis on LLE. Ok for d/c today with close f/u in HF Clinic next week. If EF does not improve with maintenance of NSR will need to consider transplant eval though BMI currently prohibitive. RV too bad for VAD.  Arvilla Meres, MD  11:14 PM

## 2017-03-26 NOTE — Progress Notes (Signed)
CARDIAC REHAB PHASE I   PRE:  Rate/Rhythm: 66 SR    BP: sitting 108/60    SaO2: 96 RA  MODE:  Ambulation: 370 ft   POST:  Rate/Rhythm: 90 SR with PVCs    BP: sitting 128/88     SaO2: 94 RA  Pt with limp but able to walk independently. Tired with distance. VSS. Pt sts he needs to tighten up on sodium. Reviewed diet and daily wts. Encouraged walking and CRPII. Pt likes to go to gym but voices CRPII will be beneficial. Will send referral to [place] [facility].  [phone]   Harriet Masson CES, ACSM 03/26/2017 3:02 PM

## 2017-03-26 NOTE — Progress Notes (Signed)
Discussed discharge instructions with patient including follow up appointment and medications.  Sent instructions with patient.  Discussed importance of monitoring weight daily.

## 2017-03-26 NOTE — Progress Notes (Signed)
  Paged for warmth and questionable erythema on RLE.   On my exam, pt R shin noticeably warmer than surrounding area, though erythema difficult to differentiate with chronic venous stasis changes.  With up trend in WBC, will start on Keflex to cover for Cellulitis.   Discussed with MD.    Baldwin Crown" [place], PA-C 03/26/2017 3:16 PM

## 2017-03-26 NOTE — Progress Notes (Signed)
Day shift RN decreased oxygen to 1 l/min. On day and early in the night no problem with oxygenation. During the sleep patient dropped down to 89%, increased oxygen to 2 l/min. All night 92%-94% oxygen saturation on  2 l/min.  Will continue to monitor.  Bettey Mare, RN

## 2017-03-26 NOTE — Progress Notes (Addendum)
Patient on room air at rest with O2 sat at 94-92% Also noted warm red area on right shin and foot.  Patient does have history of Cellulitis.  Paged doctor.

## 2017-03-28 LAB — CULTURE, BLOOD (ROUTINE X 2)
Culture: NO GROWTH
Culture: NO GROWTH
Special Requests: ADEQUATE
Special Requests: ADEQUATE

## 2017-03-29 ENCOUNTER — Telehealth: Payer: Self-pay | Admitting: Physician Assistant

## 2017-03-29 NOTE — Telephone Encounter (Signed)
   Patient states that since he was released from the hospital 3 days ago, he has had increasing weight, he has gained 4 pounds.  He feels more short of breath and is concerned.  He does not feel like he needs to come back to the hospital, but wants to know what to do.   Advised him to take an extra torsemide twice a day for 3 days.  Total dose would be 60 mg twice daily.  Advised him to take an extra potassium with the torsemide, 20 mEq 3 times a day instead of twice daily.  Keep the follow-up appointment with the heart failure clinic.  Limit sodium to 500 mg per meal.  Patient states he will do this and he is to call back tomorrow if he has not lost any weight.  Theodore Demark, PA-C 03/29/2017 8:42 AM Beeper [phone]

## 2017-03-30 ENCOUNTER — Ambulatory Visit: Payer: BC Managed Care – PPO | Admitting: Cardiology

## 2017-03-31 ENCOUNTER — Encounter (HOSPITAL_COMMUNITY): Payer: BC Managed Care – PPO

## 2017-04-01 ENCOUNTER — Telehealth (HOSPITAL_COMMUNITY): Payer: Self-pay

## 2017-04-01 NOTE — Telephone Encounter (Signed)
Called to speak with patient to see if interested in CR - patient is interested in the program. Explained scheduling process to patient and patient stated he understands that he will get a call to schedule after follow up appt is completed. Went over insurance with patient and patient verbally stated he understands what he is responsible for.

## 2017-04-01 NOTE — Telephone Encounter (Signed)
Patients insurance is active and benefits verified through [facility] - No co-pay, deductible amount of $1,080/$1,080 has been met, out of pocket amount of $4,388/$0.00 has been met, 30% co-insurance, and no pre-authorization is required. Spoke with [employer] - Reference [id number]  Will contact patient to see if interested in the CR program. If interested, patient will need to complete follow up appt. Once completed, patient will be contacted for scheduling upon review by the RN Navigator.

## 2017-04-03 ENCOUNTER — Ambulatory Visit (HOSPITAL_COMMUNITY)
Admission: RE | Admit: 2017-04-03 | Discharge: 2017-04-03 | Disposition: A | Discharge status: Home / Self Care | Payer: BC Managed Care – PPO | Source: Ambulatory Visit | Attending: Cardiology | Admitting: Cardiology

## 2017-04-03 ENCOUNTER — Telehealth (HOSPITAL_COMMUNITY): Payer: Self-pay | Admitting: Pharmacist

## 2017-04-03 ENCOUNTER — Encounter (HOSPITAL_COMMUNITY): Payer: Self-pay | Admitting: Cardiology

## 2017-04-03 ENCOUNTER — Encounter (HOSPITAL_COMMUNITY): Payer: Self-pay

## 2017-04-03 VITALS — BP 124/68 | HR 79 | Wt 288.0 lb

## 2017-04-03 DIAGNOSIS — I484 Atypical atrial flutter: Secondary | ICD-10-CM

## 2017-04-03 DIAGNOSIS — Z7901 Long term (current) use of anticoagulants: Secondary | ICD-10-CM | POA: Diagnosis not present

## 2017-04-03 DIAGNOSIS — Z79899 Other long term (current) drug therapy: Secondary | ICD-10-CM | POA: Insufficient documentation

## 2017-04-03 DIAGNOSIS — I5022 Chronic systolic (congestive) heart failure: Secondary | ICD-10-CM | POA: Diagnosis not present

## 2017-04-03 DIAGNOSIS — I872 Venous insufficiency (chronic) (peripheral): Secondary | ICD-10-CM | POA: Diagnosis not present

## 2017-04-03 DIAGNOSIS — Z86718 Personal history of other venous thrombosis and embolism: Secondary | ICD-10-CM | POA: Insufficient documentation

## 2017-04-03 DIAGNOSIS — N529 Male erectile dysfunction, unspecified: Secondary | ICD-10-CM

## 2017-04-03 DIAGNOSIS — Z9889 Other specified postprocedural states: Secondary | ICD-10-CM | POA: Diagnosis not present

## 2017-04-03 DIAGNOSIS — Z6841 Body Mass Index (BMI) 40.0 and over, adult: Secondary | ICD-10-CM | POA: Insufficient documentation

## 2017-04-03 DIAGNOSIS — Z86711 Personal history of pulmonary embolism: Secondary | ICD-10-CM | POA: Insufficient documentation

## 2017-04-03 LAB — CBC
HEMATOCRIT: 47.2 % (ref 39.0–52.0)
Hemoglobin: 15.3 g/dL (ref 13.0–17.0)
MCH: 29.1 pg (ref 26.0–34.0)
MCHC: 32.4 g/dL (ref 30.0–36.0)
MCV: 89.9 fL (ref 78.0–100.0)
PLATELETS: 267 10*3/uL (ref 150–400)
RBC: 5.25 MIL/uL (ref 4.22–5.81)
RDW: 14.1 % (ref 11.5–15.5)
WBC: 8.5 10*3/uL (ref 4.0–10.5)

## 2017-04-03 LAB — BASIC METABOLIC PANEL
Anion gap: 12 (ref 5–15)
BUN: 16 mg/dL (ref 6–20)
CHLORIDE: 98 mmol/L — AB (ref 101–111)
CO2: 26 mmol/L (ref 22–32)
Calcium: 8.4 mg/dL — ABNORMAL LOW (ref 8.9–10.3)
Creatinine, Ser: 1.32 mg/dL — ABNORMAL HIGH (ref 0.61–1.24)
GFR calc non Af Amer: >60 mL/min (ref >60)
Glucose, Bld: 128 mg/dL — ABNORMAL HIGH (ref 65–99)
POTASSIUM: 4.2 mmol/L (ref 3.5–5.1)
SODIUM: 136 mmol/L (ref 135–145)

## 2017-04-03 MED ORDER — TORSEMIDE 20 MG PO TABS: 40.0000 mg | ORAL_TABLET | Freq: Two times a day (BID) | ORAL | 6 refills | Status: DC

## 2017-04-03 MED ORDER — SACUBITRIL-VALSARTAN 24-26 MG PO TABS: 1.0000 | ORAL_TABLET | Freq: Two times a day (BID) | ORAL | 6 refills | Status: DC

## 2017-04-03 MED ORDER — AMIODARONE HCL 200 MG PO TABS: ORAL_TABLET | ORAL | 11 refills | Status: DC

## 2017-04-03 NOTE — Patient Instructions (Signed)
STOP Losartan START Entresto 24/26 mg ,o ne tab twice a day DECREASE Amiodarone to 200 mg twice a day for 7 days, then 200 mg daily  Labs today We will only contact you if something comes back abnormal or we need to make some changes. Otherwise no news is good news!  Labs needed in 7 days  Your physician recommends that you schedule a follow-up appointment in: 4 weeks with Joanell Rising   Do the following things EVERYDAY: 1) Weigh yourself in the morning before breakfast. Write it down and keep it in a log. 2) Take your medicines as prescribed 3) Eat low salt foods-Limit salt (sodium) to 2000 mg per day.  4) Stay as active as you can everyday 5) Limit all fluids for the day to less than 2 liters

## 2017-04-03 NOTE — Progress Notes (Signed)
Advanced Heart Failure Clinic Note   Patient ID: Roberto [name], male   DOB: 03/08/1965, 52 y.o.   MRN: [id number] PCP: Dr Jacqulyn Bath Cardiologist: Dr [name] EP: Dr Johney Frame Coumadin followed by PCP  HPI: Roberto [name] is a 52 y.o. 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   Admitted to [facility] 12/16/11 for recurrent HF. Diuresed 20 pounds with IV lasix. See RHC/LHC results   Admitted 10/14/16-10/21/16 with AF and tachy induced cardiomyopathy. EF dropped from 40%-> 15%. Underwent DCCV on 10/17/16 with successful conversion to NSR. Started on amiodarone. Diuresed 12 pounds with IV lasix. Discharge weight was 279 pounds. .   S/p AFL ablation 11/17/2016  Admitted 3/8 - 03/26/17. Pt underwent Outpatient DCCV 03/20/17, then came back that evening with worsening SOB and CP. Diuresed with IV lasix. Also treated for cellulitis of his RLE, and gout. Discharge weight 280 lbs.  He presents today for post hospital follow up. At last visit transitioned to Eliquis and DCCV as above. He is feeling great since discharge.  He had 4 lbs weight gain so was instructed to take extra 40 mg total of torsemide ( for total of 60 mg BID) for 3 days. He felt better on this and has continued. He has been walking around his house and had a friend who is in sports medicine give him some exercised to do. He denies lightheadedness, dizziness, SOB, palpitations, CP, or dizziness. No bleeding on Eliquis. Watching salt and fluid.   EKG today shows NSR 74 bpm, QRS 116 ms, personally reviewed  Echo 10/15/16 - LVEF 15% TEE 03/20/17 LVEF 15-20%, Severe reduced RV function  RHC 10/20/16  RA = 3 RV = 45/7 PA = 49/20 (31) PCW = 28 Fick cardiac output/index = 5.0/2.1 PVR = 0.5 WU Ao sat = 95% PA sat = 64%, 61%  12/17/11 ECHO 25-30%  05/20/2012 ECHO EF  40-45%  CARDIAC MRI - 12/18/11  1. Moderately dilated left ventricle with moderately decreased systolic function, EF 38%. Global hypokinesis.  2. Mildly dilated right ventricle with mild to moderately decreased systolic function.  3. No definite LV thrombus noted.  4. Patchy non-subendocardial delayed enhancement seen in the ventricular septum (see above for description). This is not suggestive of a coronary disease pattern. This could be suggestive of infiltrative disease versus prior myocarditis.  RHC/LHC 12/22/11  Cors: Normal  SH: Lives with his wife and 5 children. Works full time running a Daycare  Review of systems complete and found to be negative unless listed in HPI.    Past Medical History:  Diagnosis Date  . Arthritis   . Chronic combined systolic and diastolic CHF (congestive heart failure) (HCC)   . Deep vein thrombosis (HCC)   . Lupus anticoagulant positive   . Morbid obesity (HCC)   . Mural thrombus of heart    coumadin  . Nonischemic cardiomyopathy (HCC)    a) 12/17/11 echo: LVEF 25-30%, grade 3 diastolic dysfunction (c/w restriction), mod MR, mod LA/LV and mild RA dilatation; b) 12/18/11 cMRI: LVEF 38%, mod LV/mild RV dilatation, global HK, mild-mod RV sys dysfxn, no LV thrombus & patchy non-subendocardial delayed enhancement c/w infil dz or prior myocarditis   . Paroxysmal SVT (supraventricular tachycardia) (HCC)   . Pulmonary embolism (HCC)    DVT and PE after knee surgery  in 2007    Current Outpatient Medications  Medication Sig Dispense Refill  . acetaminophen (TYLENOL) 500 MG tablet Take 500-1,000 mg by mouth every 6 (six) hours as needed for moderate pain or headache.     [name] amiodarone (PACERONE) 400 MG tablet Take 1 tablet (400 mg total) by mouth 2 (two) times daily. 60 tablet 11  . apixaban (ELIQUIS) 5 MG TABS tablet Take 1 tablet (5 mg total) by mouth 2 (two) times daily. 60 tablet 11  . coconut oil OIL Apply 1 application topically daily.    . digoxin  (DIGOX) 0.125 MG tablet TAKE 1 TABLET(0.125 MG) BY MOUTH DAILY 90 tablet 3  . losartan (COZAAR) 25 MG tablet Take 0.5 tablets (12.5 mg total) by mouth at bedtime. 30 tablet 6  . Magnesium 400 MG TABS Take 400 mg by mouth daily. 30 tablet 11  . Naltrexone-Bupropion HCl ER (CONTRAVE) 8-90 MG TB12 Take 2 tablets by mouth 2 (two) times daily. 360 tablet 3  . Omega-3 Fatty Acids (FISH OIL PO) Take 1 capsule by mouth 2 (two) times daily.    . potassium chloride SA (K-DUR,KLOR-CON) 20 MEQ tablet Take 1 tablet (20 mEq total) by mouth 2 (two) times daily. 180 tablet 3  . predniSONE (DELTASONE) 20 MG tablet Take 2 tablets (40 mg total) by mouth daily with breakfast. 2 tablet 0  . sildenafil (VIAGRA) 100 MG tablet Take 0.5-1 tablets (50-100 mg total) by mouth daily as needed for erectile dysfunction. 10 tablet 0  . spironolactone (ALDACTONE) 25 MG tablet Take 0.5 tablets (12.5 mg total) by mouth daily. 45 tablet 3  . Testosterone (ANDROGEL) 20.25 MG/1.25GM (1.62%) GEL Place 2 Squirts onto the skin daily. 1.25 g 2  . torsemide (DEMADEX) 20 MG tablet Take 2 tablets (40 mg total) by mouth 2 (two) times daily. 120 tablet 0   No current facility-administered medications for this encounter.    PHYSICAL EXAM: Vitals:   04/03/17 0836  BP: 124/68  Pulse: 79  SpO2: 97%  Weight: 288 lb (130.6 kg)   Wt Readings from Last 3 Encounters:  04/03/17 288 lb (130.6 kg)  03/26/17 280 lb 1.6 oz (127.1 kg)  03/18/17 (!) 300 lb 6.4 oz (136.3 kg)    Physical Exam General: Well appearing. No resp difficulty. HEENT: Normal Neck: Supple. JVP 6-7 cm. Carotids 2+ bilat; no bruits. No thyromegaly or nodule noted. Cor: PMI nondisplaced. RRR, No M/G/R noted Lungs: CTAB, normal effort. Abdomen: Soft, non-tender, non-distended, no HSM. No bruits or masses. +BS  Extremities: No cyanosis, clubbing, or rash. Trace ankle edema.  Neuro: Alert & orientedx3, cranial nerves grossly intact. moves all 4 extremities w/o difficulty.  Affect pleasant   EKG NSR 74 bpm, QRS 116 ms, personally reviewed.   ASSESSMENT & PLAN:  1. Chronic Systolic Heart Failure: Recent Echo EF 15%, drop from 40-45% in 2016, felt to be related to Afib.  - Echo 10/15/16 - LVEF 15% - NYHA II symptoms in sinus.  - Volume status stable on exam - Take torsemide back to 40 mg BID.  - Continue spiro 12.5 mg daily - Continue digoxin 0.125 mg daily. - Consider low dose BB at next visit.  - Stop losartan. Switch to Entresto 24/26 mg BID. BMET today and 10 days.  - Reinforced fluid restriction to < 2 L daily, sodium restriction to less than 2000 mg daily, and the importance of daily weights.    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 - EKG today shows NSR -  Decrease amiodarone to 200 mg BID x 1 week, then 200 mg daily.  - Continue Eliquis 5 mg BID. CBC today.  - Will order sleep study at next visit with med titration.   3. H/o DVT/PE - Continue Eliquis 5 mg BID.   4. Morbid Obestiy  - Body mass index is 45.11 kg/m.  - Stressed importance of Weight loss.   5. ED - OK to use low dose sildenafil. Not on any long acting nitrates.   Meds and labs as above.  RTC 4 weeks. Repeat labs 10 days with switch to [facility]. Plan repeat Echo July with GDMT.   Graciella Freer, PA-C  8:34 AM   Greater than 50% of the 30 minute visit was spent in counseling/coordination of care regarding disease state education, salt/fluid restriction, sliding scale diuretics, and medication compliance.

## 2017-04-03 NOTE — Telephone Encounter (Signed)
Entresto 24-26 mg BID approved by [facility] through 04/04/18.   Tyler Deis. Bonnye Fava, PharmD, BCPS, CPP Clinical Pharmacist Phone: [phone] 04/03/2017 11:18 AM

## 2017-04-07 ENCOUNTER — Telehealth (HOSPITAL_COMMUNITY): Payer: Self-pay

## 2017-04-07 NOTE — Telephone Encounter (Signed)
Attempted to call patient in regards to Cardiac Rehab - lm on vm °

## 2017-04-10 ENCOUNTER — Ambulatory Visit (HOSPITAL_COMMUNITY)
Admission: RE | Admit: 2017-04-10 | Discharge: 2017-04-10 | Disposition: A | Discharge status: Home / Self Care | Payer: BC Managed Care – PPO | Source: Ambulatory Visit | Attending: Cardiology | Admitting: Cardiology

## 2017-04-10 DIAGNOSIS — I5022 Chronic systolic (congestive) heart failure: Secondary | ICD-10-CM | POA: Diagnosis present

## 2017-04-10 LAB — BASIC METABOLIC PANEL
Anion gap: 11 (ref 5–15)
BUN: 12 mg/dL (ref 6–20)
CALCIUM: 8.9 mg/dL (ref 8.9–10.3)
CHLORIDE: 102 mmol/L (ref 101–111)
CO2: 25 mmol/L (ref 22–32)
CREATININE: 1.26 mg/dL — AB (ref 0.61–1.24)
GFR calc non Af Amer: >60 mL/min (ref >60)
GLUCOSE: 109 mg/dL — AB (ref 65–99)
Potassium: 4.4 mmol/L (ref 3.5–5.1)
Sodium: 138 mmol/L (ref 135–145)

## 2017-04-14 ENCOUNTER — Telehealth (HOSPITAL_COMMUNITY): Payer: Self-pay

## 2017-04-14 NOTE — Telephone Encounter (Signed)
Called and spoke with patient in regards to Cardiac Rehab - Patient stated he has a lot going on right now and will not be participating in the program at this time. Closed referral.

## 2017-04-30 ENCOUNTER — Other Ambulatory Visit (HOSPITAL_COMMUNITY): Payer: BC Managed Care – PPO

## 2017-04-30 ENCOUNTER — Encounter (HOSPITAL_COMMUNITY): Payer: BC Managed Care – PPO | Admitting: Internal Medicine

## 2017-05-01 ENCOUNTER — Encounter (HOSPITAL_COMMUNITY): Payer: BC Managed Care – PPO

## 2017-05-04 ENCOUNTER — Telehealth: Payer: Self-pay

## 2017-05-04 ENCOUNTER — Other Ambulatory Visit: Payer: Self-pay | Admitting: Medical

## 2017-05-04 NOTE — Telephone Encounter (Signed)
Is this okay to refill? 

## 2017-05-04 NOTE — Telephone Encounter (Signed)
[facility] sent a fax stating that patient's insurance does not cover sildenafil. The alternatives insurance will cover are Tadalafil or Vardenafil. [facility].

## 2017-05-04 NOTE — Telephone Encounter (Signed)
Fix it 

## 2017-05-08 NOTE — Telephone Encounter (Signed)
Called pharmacy & they ran the Sildenafil with discount card and pt was able to get for $15

## 2017-06-03 ENCOUNTER — Encounter (HOSPITAL_COMMUNITY): Payer: BC Managed Care – PPO | Admitting: Internal Medicine

## 2017-06-03 ENCOUNTER — Ambulatory Visit (HOSPITAL_COMMUNITY): Payer: BC Managed Care – PPO

## 2017-07-05 ENCOUNTER — Other Ambulatory Visit: Payer: Self-pay | Admitting: Medical

## 2017-07-05 DIAGNOSIS — I5022 Chronic systolic (congestive) heart failure: Secondary | ICD-10-CM

## 2017-07-06 NOTE — Telephone Encounter (Signed)
Is this ok to refill?  

## 2017-07-07 ENCOUNTER — Other Ambulatory Visit: Payer: Self-pay | Admitting: Medical

## 2017-07-07 ENCOUNTER — Telehealth: Payer: Self-pay | Admitting: Family Medicine

## 2017-07-07 MED ORDER — TESTOSTERONE 20.25 MG/1.25GM (1.62%) TD GEL: 2.0000 | Freq: Every day | TRANSDERMAL | 0 refills | Status: DC

## 2017-07-07 NOTE — Telephone Encounter (Signed)
Med sent.   Needs appt.

## 2017-07-07 NOTE — Telephone Encounter (Signed)
[facility] req Testosterone 1.62% Gel Pump   #75

## 2017-07-08 NOTE — Telephone Encounter (Signed)
Called and left message with pt that RX was sent to pharmacy and that he needs a appt

## 2017-07-10 ENCOUNTER — Other Ambulatory Visit: Payer: Self-pay | Admitting: Family Medicine

## 2017-07-13 NOTE — Telephone Encounter (Signed)
[facility] is requesting to fill pt viagra. Please advise K[facility] 

## 2017-08-03 ENCOUNTER — Other Ambulatory Visit (HOSPITAL_COMMUNITY): Payer: Self-pay | Admitting: Internal Medicine

## 2017-08-10 ENCOUNTER — Other Ambulatory Visit: Payer: Self-pay

## 2017-08-10 ENCOUNTER — Ambulatory Visit (HOSPITAL_COMMUNITY)
Admission: RE | Admit: 2017-08-10 | Discharge: 2017-08-10 | Disposition: A | Discharge status: Home / Self Care | Payer: BC Managed Care – PPO | Source: Ambulatory Visit | Attending: Student | Admitting: Student

## 2017-08-10 ENCOUNTER — Ambulatory Visit (HOSPITAL_BASED_OUTPATIENT_CLINIC_OR_DEPARTMENT_OTHER)
Admission: RE | Admit: 2017-08-10 | Discharge: 2017-08-10 | Disposition: A | Discharge status: Home / Self Care | Payer: BC Managed Care – PPO | Source: Ambulatory Visit | Attending: Internal Medicine | Admitting: Internal Medicine

## 2017-08-10 VITALS — BP 101/61 | HR 58 | Wt 295.8 lb

## 2017-08-10 DIAGNOSIS — I824Z9 Acute embolism and thrombosis of unspecified deep veins of unspecified distal lower extremity: Secondary | ICD-10-CM

## 2017-08-10 DIAGNOSIS — I872 Venous insufficiency (chronic) (peripheral): Secondary | ICD-10-CM | POA: Insufficient documentation

## 2017-08-10 DIAGNOSIS — I480 Paroxysmal atrial fibrillation: Secondary | ICD-10-CM

## 2017-08-10 DIAGNOSIS — I5022 Chronic systolic (congestive) heart failure: Secondary | ICD-10-CM

## 2017-08-10 DIAGNOSIS — Z7901 Long term (current) use of anticoagulants: Secondary | ICD-10-CM | POA: Diagnosis not present

## 2017-08-10 DIAGNOSIS — N529 Male erectile dysfunction, unspecified: Secondary | ICD-10-CM | POA: Insufficient documentation

## 2017-08-10 DIAGNOSIS — I440 Atrioventricular block, first degree: Secondary | ICD-10-CM | POA: Diagnosis not present

## 2017-08-10 DIAGNOSIS — Z6841 Body Mass Index (BMI) 40.0 and over, adult: Secondary | ICD-10-CM | POA: Diagnosis not present

## 2017-08-10 DIAGNOSIS — I428 Other cardiomyopathies: Secondary | ICD-10-CM | POA: Diagnosis not present

## 2017-08-10 DIAGNOSIS — Z86711 Personal history of pulmonary embolism: Secondary | ICD-10-CM | POA: Diagnosis not present

## 2017-08-10 DIAGNOSIS — M1990 Unspecified osteoarthritis, unspecified site: Secondary | ICD-10-CM | POA: Diagnosis not present

## 2017-08-10 DIAGNOSIS — Z86718 Personal history of other venous thrombosis and embolism: Secondary | ICD-10-CM | POA: Diagnosis not present

## 2017-08-10 DIAGNOSIS — I483 Typical atrial flutter: Secondary | ICD-10-CM | POA: Diagnosis not present

## 2017-08-10 DIAGNOSIS — Z79899 Other long term (current) drug therapy: Secondary | ICD-10-CM | POA: Insufficient documentation

## 2017-08-10 LAB — COMPREHENSIVE METABOLIC PANEL
ALK PHOS: 41 U/L (ref 38–126)
ALT: 15 U/L (ref 0–44)
AST: 20 U/L (ref 15–41)
Albumin: 3.5 g/dL (ref 3.5–5.0)
Anion gap: 10 (ref 5–15)
BUN: 19 mg/dL (ref 6–20)
CALCIUM: 9.2 mg/dL (ref 8.9–10.3)
CO2: 28 mmol/L (ref 22–32)
Chloride: 101 mmol/L (ref 98–111)
Creatinine, Ser: 1.27 mg/dL — ABNORMAL HIGH (ref 0.61–1.24)
GFR calc Af Amer: >60 mL/min (ref >60)
GFR calc non Af Amer: >60 mL/min (ref >60)
GLUCOSE: 93 mg/dL (ref 70–99)
Potassium: 3.7 mmol/L (ref 3.5–5.1)
Sodium: 139 mmol/L (ref 135–145)
TOTAL PROTEIN: 7.8 g/dL (ref 6.5–8.1)
Total Bilirubin: 1 mg/dL (ref 0.3–1.2)

## 2017-08-10 LAB — TSH: TSH: 1.876 u[IU]/mL (ref 0.350–4.500)

## 2017-08-10 MED ORDER — AMIODARONE HCL 200 MG PO TABS: 200.0000 mg | ORAL_TABLET | Freq: Every day | ORAL | 3 refills | Status: DC

## 2017-08-10 MED ORDER — PERFLUTREN LIPID MICROSPHERE
1.0000 mL | INTRAVENOUS | Status: DC | PRN
  Administered 2017-08-10: 3 mL via INTRAVENOUS
  Filled 2017-08-10: qty 10

## 2017-08-10 NOTE — Progress Notes (Signed)
Advanced Heart Failure Clinic Note   Patient ID: Roberto [name], male   DOB: 22-Nov-1965, 52 y.o.   MRN: [id number] PCP: Dr Jacqulyn Bath Cardiologist: Dr [name] EP: Dr Johney Frame Coumadin followed by PCP  HPI: Roberto [name] is a 52 y.o. 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   Admitted to [facility] 12/16/11 for recurrent HF. Diuresed 20 pounds with IV lasix. See RHC/LHC results   Admitted 10/14/16-10/21/16 with AF and tachy induced cardiomyopathy. EF dropped from 40%-> 15%. Underwent DCCV on 10/17/16 with successful conversion to NSR. Started on amiodarone. Diuresed 12 pounds with IV lasix. Discharge weight was 279 pounds.  S/p AFL ablation 11/17/2016  Admitted 3/8 - 03/26/17. Pt underwent outpatient DCCV 03/20/17, then came back that evening with worsening SOB and CP. Diuresed with IV lasix. Also treated for cellulitis of his RLE, and gout. Discharge weight 280 lbs.  He presents today for hospital follow up. At last visit torsemide dropped to 40 bid (was taking 60 bid for several days) and losartan switched to [employer]. Remains on Eliquis. He is overall doing well. Denies SOB, PND, or orthopnea. Needs refill on his potassium since Friday. Denies dizziness and lightheadedness. He has an old knee injury that his resulted in arthritis, and he has been previously told that he would need a knee replacement.Says it keeps him from walking much or riding the stationary bike. Has some swelling in LLE which is chronic due to previous patellar tendon repair. No edema on R.   Echo today shows: EF appears slightly improved to 20-25%. Possible clot (vs artifact in IVC/RA junction) Personally reviewed   EKG today Sinus rhythm 63 with 1st degree AV block.    Echo 10/15/16 - LVEF 15% TEE 03/20/17 LVEF 15-20%, Severe reduced RV  function  RHC 10/20/16  RA = 3 RV = 45/7 PA = 49/20 (31) PCW = 28 Fick cardiac output/index = 5.0/2.1 PVR = 0.5 WU Ao sat = 95% PA sat = 64%, 61%  12/17/11 ECHO 25-30%  05/20/2012 ECHO EF 40-45%  CARDIAC MRI - 12/18/11  1. Moderately dilated left ventricle with moderately decreased systolic function, EF 38%. Global hypokinesis.  2. Mildly dilated right ventricle with mild to moderately decreased systolic function.  3. No definite LV thrombus noted.  4. Patchy non-subendocardial delayed enhancement seen in the ventricular septum (see above for description). This is not suggestive of a coronary disease pattern. This could be suggestive of infiltrative disease versus prior myocarditis.  RHC/LHC 12/22/11  Cors: Normal  SH: Lives with his wife and 5 children. Works full time running a Daycare  Review of systems complete and found to be negative unless listed in HPI.    Past Medical History:  Diagnosis Date  . Arthritis   . Chronic combined systolic and diastolic CHF (congestive heart failure) (HCC)   . Deep vein thrombosis (HCC)   . Lupus anticoagulant positive   . Morbid obesity (HCC)   . Mural thrombus of heart    coumadin  . Nonischemic cardiomyopathy (HCC)    a) 12/17/11 echo: LVEF 25-30%, grade 3 diastolic dysfunction (c/w restriction), mod MR, mod LA/LV and mild RA dilatation; b) 12/18/11 cMRI: LVEF 38%, mod LV/mild RV dilatation, global HK, mild-mod RV sys dysfxn, no LV thrombus & patchy non-subendocardial delayed enhancement c/w infil dz or prior  myocarditis   . Paroxysmal SVT (supraventricular tachycardia) (HCC)   . Pulmonary embolism (HCC)    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    Current Outpatient Medications  Medication Sig Dispense Refill  . acetaminophen (TYLENOL) 500 MG tablet Take 500-1,000 mg by mouth every 6 (six) hours as needed for moderate pain or headache.     [name] amiodarone (PACERONE) 200 MG tablet Take 200 mg by mouth 2 (two) times daily.    [name] apixaban  (ELIQUIS) 5 MG TABS tablet Take 1 tablet (5 mg total) by mouth 2 (two) times daily. 60 tablet 11  . coconut oil OIL Apply 1 application topically daily.    . digoxin (DIGOX) 0.125 MG tablet TAKE 1 TABLET(0.125 MG) BY MOUTH DAILY 90 tablet 3  . potassium chloride SA (K-DUR,KLOR-CON) 20 MEQ tablet Take 1 tablet (20 mEq total) by mouth 2 (two) times daily. 180 tablet 3  . sacubitril-valsartan (ENTRESTO) 24-26 MG Take 1 tablet by mouth 2 (two) times daily. 60 tablet 6  . sildenafil (VIAGRA) 100 MG tablet TAKE 1/2 TO 1 TABLET(50 TO 100 MG) BY MOUTH DAILY AS NEEDED FOR ERECTILE DYSFUNCTION 10 tablet 5  . spironolactone (ALDACTONE) 25 MG tablet Take 0.5 tablets (12.5 mg total) by mouth daily. 45 tablet 3  . Testosterone (ANDROGEL) 20.25 MG/1.25GM (1.62%) GEL Place 2 Squirts onto the skin daily. 1.25 g 0  . torsemide (DEMADEX) 20 MG tablet Take 2 tablets (40 mg total) by mouth 2 (two) times daily. 120 tablet 6   No current facility-administered medications for this encounter.    Facility-Administered Medications Ordered in Other Encounters  Medication Dose Route Frequency Provider Last Rate Last Dose  . perflutren lipid microspheres (DEFINITY) IV suspension  1-10 mL Intravenous PRN Graciella Freer, PA-C   3 mL at 08/10/17 1345   PHYSICAL EXAM: Vitals:   08/10/17 1406  BP: 101/61  Pulse: (!) 58  SpO2: 98%  Weight: 295 lb 12.8 oz (134.2 kg)   Wt Readings from Last 3 Encounters:  08/10/17 295 lb 12.8 oz (134.2 kg)  04/03/17 288 lb (130.6 kg)  03/26/17 280 lb 1.6 oz (127.1 kg)    Physical Exam General: Obese Well appearing. No resp difficulty. HEENT: Normal anicteric  Neck: Supple. JVP 5-6. Carotids 2+ bilat; no bruits. No thyromegaly or nodule noted. Cor: PMI laterally displaced. RRR, No M/G/R noted Lungs: CTAB, normal effort. Abdomen: Obese Soft, non-tender, non-distended, no HSM. No bruits or masses. +BS  Extremities: no cyanosis, clubbing, or rash. Trace R ankle edema. 1+ LE  edema.Well-healed scar L knee  Neuro: alert & oriented x 3, cranial nerves grossly intact. moves all 4 extremities w/o difficulty. Affect pleasant   EKG NSR 74 bpm, QRS 116 ms, personally reviewed.   ASSESSMENT & PLAN:  1. Chronic Systolic Heart Failure: Recent Echo EF 15%, drop from 40-45% in 2016, felt to be related to Afib.  - Echo 10/15/16 - LVEF 15%, TEE 03/2017 LVEF 15-20% - Echo today with LVEF 20-25%. Apparent clot visible in IVC.  - NYHA II symptoms - Volume status stable on exam.   - Continue torsemide 40 mg BID.  - Continue spiro 12.5 mg daily - Continue digoxin 0.125 mg daily. - Consider low dose BB at next visit.  - Continue Entresto 24/26 mg BID.  - Reinforced fluid restriction to < 2 L daily, sodium restriction to less than 2000 mg daily, and the importance of daily weights.   - Needs to see EP for ICD  consideration 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 - EKG today shows Sinus rhythm at 63 bpm with 1st degree AV block.  - Decrease amiodarone to 200 mg daily.  - Continue Eliquis 5 mg BID.  - Needs sleep study.  3. H/o DVT/PE - Continue Eliquis 5 mg BID.  - Now with apaprent IVC 4. Morbid Obestiy  - Body mass index is 46.33 kg/m.  - Stressed importance of Weight loss.  5. ED - OK to use low dose sildenafil prn.  - Not on any long acting nitrates.  6. R knee injury -> Arthritis - Will need surgery; total knee replacement - Requesting cardiac clearance.  By Revised Cardiac Risk Index Nedra Hai Criteria) he is at Low risk (0.9%) for perioperative complication. With EF 20-25%, suspect this is underestimated and he would be at least moderate to high risk, though likely not prohibitive.   Graciella Freer, PA-C  2:25 PM   Patient seen and examined with the above-signed Advanced Practice Provider and/or Housestaff. I personally reviewed laboratory data, imaging studies and relevant notes. I independently examined the patient and formulated the important aspects of  the plan. I have edited the note to reflect any of my changes or salient points. I have personally discussed the plan with the patient and/or family.  Overall doing fairly well. NYHA II. Volume status ok. Echo reviewed personally and EF remains < 35% despite maintenance of NSR. Will refer for ICD. Continue AC. We discussed pei-operative risk for TKA. Given low EF he is likely moderate to high risk for CV complications but I do not think the risk is prohibitive. Given lifestyle limiting symptoms I think it would be reasonable to proceed.   Arvilla Meres, MD  11:02 PM

## 2017-08-10 NOTE — Patient Instructions (Signed)
Labs today (will call for abnormal results, otherwise no news is good news)  DECREASE Amiodarone to 200 mg Once daily  Referral placed for you to see EP for possible ICD placement, their office will contact you to schedule initial appointment.  Follow up in 3 Months

## 2017-08-10 NOTE — Progress Notes (Signed)
  Echocardiogram 2D Echocardiogram has been performed.  Roberto [name] 08/10/2017, 2:05 PM

## 2017-08-12 ENCOUNTER — Ambulatory Visit: Payer: BC Managed Care – PPO | Admitting: Family Medicine

## 2017-08-12 ENCOUNTER — Encounter: Payer: Self-pay | Admitting: Family Medicine

## 2017-08-12 VITALS — BP 110/68 | HR 60 | Temp 98.2°F | Wt 295.8 lb

## 2017-08-12 DIAGNOSIS — E291 Testicular hypofunction: Secondary | ICD-10-CM | POA: Diagnosis not present

## 2017-08-12 DIAGNOSIS — M25561 Pain in right knee: Secondary | ICD-10-CM

## 2017-08-12 DIAGNOSIS — G8929 Other chronic pain: Secondary | ICD-10-CM

## 2017-08-12 NOTE — Progress Notes (Signed)
   Subjective:    Patient ID: Roberto [name], male    DOB: [birth year], 52 y.o.   MRN: [id number]  HPI He is here for consult concerning right knee pain.  He has had this for several years and apparently did have x-rays done in the past but no record shows up.  He has an orthopedic surgeon in the [place] area and he would like to see.  He states that now he any amount of walking or bending of his knee causes pain.  He has lost over 50 pounds over the last several years.  He also is now on testosterone replacement with 2 pumps daily and needs follow-up blood work.   Review of Systems     Objective:   Physical Exam Alert and in no distress otherwise not examined       Assessment & Plan:  Chronic pain of right knee - Plan: DG Knee Complete 4 Views Right, Ambulatory referral to Orthopedic Surgery  Hypogonadism in male - Plan: Testosterone

## 2017-08-13 ENCOUNTER — Other Ambulatory Visit (HOSPITAL_COMMUNITY): Payer: Self-pay | Admitting: *Deleted

## 2017-08-13 ENCOUNTER — Ambulatory Visit
Admission: RE | Admit: 2017-08-13 | Discharge: 2017-08-13 | Disposition: A | Discharge status: Home / Self Care | Payer: BC Managed Care – PPO | Source: Ambulatory Visit | Attending: Family Medicine | Admitting: Family Medicine

## 2017-08-13 LAB — TESTOSTERONE: Testosterone: 545 ng/dL (ref 264–916)

## 2017-08-13 MED ORDER — POTASSIUM CHLORIDE CRYS ER 20 MEQ PO TBCR: 20.0000 meq | EXTENDED_RELEASE_TABLET | Freq: Two times a day (BID) | ORAL | 3 refills | Status: DC

## 2017-08-13 MED ORDER — TESTOSTERONE 20.25 MG/1.25GM (1.62%) TD GEL: 2.0000 | Freq: Every day | TRANSDERMAL | 5 refills | Status: DC

## 2017-08-13 NOTE — Addendum Note (Signed)
Addended by: [name] on: 08/13/2017 08:38 AM   Modules accepted: Orders

## 2017-09-10 ENCOUNTER — Telehealth (HOSPITAL_COMMUNITY): Payer: Self-pay

## 2017-09-10 NOTE — Telephone Encounter (Signed)
Pt called and stated that she is having cramps in both legs. Pt states that he has been a no carb diet for 4 weeks and has lost 7 lbs in the last 10 days. Pt also states if there is anything he can take for the leg cramping or what he should do? Please advise.

## 2017-09-10 NOTE — Telephone Encounter (Signed)
Please get a BMET and Magnesium level.

## 2017-09-11 NOTE — Telephone Encounter (Signed)
Lab appt scheduled for 09/04.

## 2017-09-16 ENCOUNTER — Ambulatory Visit (HOSPITAL_COMMUNITY)
Admission: RE | Admit: 2017-09-16 | Discharge: 2017-09-16 | Disposition: A | Discharge status: Home / Self Care | Payer: BC Managed Care – PPO | Source: Ambulatory Visit | Attending: Internal Medicine | Admitting: Internal Medicine

## 2017-09-16 DIAGNOSIS — I5022 Chronic systolic (congestive) heart failure: Secondary | ICD-10-CM | POA: Diagnosis not present

## 2017-09-16 LAB — BASIC METABOLIC PANEL
Anion gap: 11 (ref 5–15)
BUN: 22 mg/dL — ABNORMAL HIGH (ref 6–20)
CALCIUM: 8.5 mg/dL — AB (ref 8.9–10.3)
CHLORIDE: 102 mmol/L (ref 98–111)
CO2: 26 mmol/L (ref 22–32)
CREATININE: 1.41 mg/dL — AB (ref 0.61–1.24)
GFR calc non Af Amer: 56 mL/min — ABNORMAL LOW (ref >60)
Glucose, Bld: 94 mg/dL (ref 70–99)
Potassium: 4.3 mmol/L (ref 3.5–5.1)
Sodium: 139 mmol/L (ref 135–145)

## 2017-09-16 LAB — MAGNESIUM: Magnesium: 2.2 mg/dL (ref 1.7–2.4)

## 2017-10-02 ENCOUNTER — Other Ambulatory Visit (HOSPITAL_COMMUNITY): Payer: Self-pay | Admitting: *Deleted

## 2017-10-02 ENCOUNTER — Telehealth (HOSPITAL_COMMUNITY): Payer: Self-pay | Admitting: *Deleted

## 2017-10-02 DIAGNOSIS — I5022 Chronic systolic (congestive) heart failure: Secondary | ICD-10-CM

## 2017-10-02 MED ORDER — POTASSIUM CHLORIDE CRYS ER 20 MEQ PO TBCR: 40.0000 meq | EXTENDED_RELEASE_TABLET | Freq: Two times a day (BID) | ORAL | 3 refills | Status: DC

## 2017-10-02 NOTE — Telephone Encounter (Signed)
Pt called requesting refill of KCL, he states he called our office back on 8/28 due to leg cramps and we advised him to get labs, he states he was unable to get the labs done until 9/4 but he went ahead and increased his KCL to 40 meq BID on 8/28, and he reports that helped his leg cramps.  He now has ran out and the pharmacy has denied his refill b/c it is to soon.  Pt's labs on 9/4 showed k of 4.3, advised pt would send in rx with 40 BID directions but he has to come back for f/u labs to make sure level does not get to high, he is agreeable with this plan, f/u labs sch for 9/30 as he will be out of town all next week.

## 2017-10-12 ENCOUNTER — Ambulatory Visit (HOSPITAL_COMMUNITY)
Admission: RE | Admit: 2017-10-12 | Discharge: 2017-10-12 | Disposition: A | Discharge status: Home / Self Care | Payer: BC Managed Care – PPO | Source: Ambulatory Visit | Attending: Internal Medicine | Admitting: Internal Medicine

## 2017-10-12 DIAGNOSIS — I5022 Chronic systolic (congestive) heart failure: Secondary | ICD-10-CM | POA: Diagnosis present

## 2017-10-12 LAB — BASIC METABOLIC PANEL
Anion gap: 8 (ref 5–15)
BUN: 20 mg/dL (ref 6–20)
CALCIUM: 8.8 mg/dL — AB (ref 8.9–10.3)
CHLORIDE: 104 mmol/L (ref 98–111)
CO2: 27 mmol/L (ref 22–32)
CREATININE: 1.25 mg/dL — AB (ref 0.61–1.24)
GFR calc Af Amer: >60 mL/min (ref >60)
GFR calc non Af Amer: >60 mL/min (ref >60)
Glucose, Bld: 87 mg/dL (ref 70–99)
Potassium: 4.1 mmol/L (ref 3.5–5.1)
Sodium: 139 mmol/L (ref 135–145)

## 2017-10-29 ENCOUNTER — Other Ambulatory Visit (HOSPITAL_COMMUNITY): Payer: Self-pay

## 2017-10-29 MED ORDER — TORSEMIDE 20 MG PO TABS: 40.0000 mg | ORAL_TABLET | Freq: Two times a day (BID) | ORAL | 5 refills | Status: DC

## 2017-10-29 MED ORDER — SACUBITRIL-VALSARTAN 24-26 MG PO TABS: 1.0000 | ORAL_TABLET | Freq: Two times a day (BID) | ORAL | 5 refills | Status: DC

## 2017-11-01 ENCOUNTER — Other Ambulatory Visit (HOSPITAL_COMMUNITY): Payer: Self-pay | Admitting: Internal Medicine

## 2017-11-01 ENCOUNTER — Other Ambulatory Visit (HOSPITAL_COMMUNITY): Payer: Self-pay | Admitting: Student

## 2017-11-11 ENCOUNTER — Encounter (HOSPITAL_COMMUNITY): Payer: BC Managed Care – PPO | Admitting: Internal Medicine

## 2017-11-17 ENCOUNTER — Encounter: Payer: Self-pay | Admitting: Family Medicine

## 2017-11-17 ENCOUNTER — Ambulatory Visit: Payer: BC Managed Care – PPO | Admitting: Family Medicine

## 2017-11-17 VITALS — BP 104/68 | HR 74 | Temp 98.2°F | Wt 292.0 lb

## 2017-11-17 DIAGNOSIS — J209 Acute bronchitis, unspecified: Secondary | ICD-10-CM | POA: Diagnosis not present

## 2017-11-17 MED ORDER — AMOXICILLIN 875 MG PO TABS: 875.0000 mg | ORAL_TABLET | Freq: Two times a day (BID) | ORAL | 0 refills | Status: DC

## 2017-11-17 NOTE — Progress Notes (Signed)
   Subjective:    Patient ID: Roberto [name], male    DOB: 1965-02-27, 52 y.o.   MRN: [id number]  HPI He complains of a 4-day history started with a cough and chest congestion followed by slight sore throat and PND.  Coughing is slowly gotten worse.  Delsym is not helped.  No fever, chills, earache.  He does not smoke.  Continues on multiple cardiac medications and is having no difficulty with them.   Review of Systems     Objective:   Physical Exam Alert and in no distress. Tympanic membranes and canals are normal. Pharyngeal area is normal. Neck is supple without adenopathy or thyromegaly. Cardiac exam shows a regular sinus rhythm without murmurs or gallops. Lungs are clear to auscultation.        Assessment & Plan:  Acute bronchitis, unspecified organism - Plan: amoxicillin (AMOXIL) 875 MG tablet With his underlying heart disease and leaving town, think it is prudent to place him on an antibiotic.  He is comfortable with that.

## 2017-12-28 MED ORDER — TRAMADOL HCL 50 MG PO TABS
50.00 | ORAL_TABLET | ORAL | Status: DC
Start: 2017-12-27 — End: 2017-12-28

## 2017-12-28 MED ORDER — CELECOXIB 200 MG PO CAPS
200.00 | ORAL_CAPSULE | ORAL | Status: DC
Start: 2017-12-27 — End: 2017-12-28

## 2017-12-28 MED ORDER — OXYCODONE HCL 5 MG PO TABS
5.00 | ORAL_TABLET | ORAL | Status: DC
Start: ? — End: 2017-12-28

## 2017-12-28 MED ORDER — SPIRONOLACTONE 25 MG PO TABS
12.50 | ORAL_TABLET | ORAL | Status: DC
Start: 2017-12-28 — End: 2017-12-28

## 2017-12-28 MED ORDER — ACETAMINOPHEN 325 MG PO TABS
650.00 | ORAL_TABLET | ORAL | Status: DC
Start: 2017-12-27 — End: 2017-12-28

## 2017-12-28 MED ORDER — ALUM & MAG HYDROXIDE-SIMETH 200-200-20 MG/5ML PO SUSP
30.00 | ORAL | Status: DC
Start: ? — End: 2017-12-28

## 2017-12-28 MED ORDER — METOCLOPRAMIDE HCL 5 MG/ML IJ SOLN
10.00 | INTRAMUSCULAR | Status: DC
Start: ? — End: 2017-12-28

## 2017-12-28 MED ORDER — METOCLOPRAMIDE HCL 10 MG PO TABS
10.00 | ORAL_TABLET | ORAL | Status: DC
Start: ? — End: 2017-12-28

## 2017-12-28 MED ORDER — GABAPENTIN 300 MG PO CAPS
300.00 | ORAL_CAPSULE | ORAL | Status: DC
Start: 2017-12-27 — End: 2017-12-28

## 2017-12-28 MED ORDER — DOCUSATE SODIUM 100 MG PO CAPS
200.00 | ORAL_CAPSULE | ORAL | Status: DC
Start: 2017-12-28 — End: 2017-12-28

## 2017-12-28 MED ORDER — HYDROMORPHONE HCL 1 MG/ML IJ SOLN
0.20 | INTRAMUSCULAR | Status: DC
Start: ? — End: 2017-12-28

## 2017-12-28 MED ORDER — TORSEMIDE 20 MG PO TABS
40.00 | ORAL_TABLET | ORAL | Status: DC
Start: 2017-12-27 — End: 2017-12-28

## 2017-12-28 MED ORDER — GENERIC EXTERNAL MEDICATION
0.04 | Status: DC
Start: ? — End: 2017-12-28

## 2017-12-28 MED ORDER — GENERIC EXTERNAL MEDICATION
0.08 | Status: DC
Start: ? — End: 2017-12-28

## 2017-12-28 MED ORDER — HYDROMORPHONE HCL 1 MG/ML IJ SOLN
0.50 | INTRAMUSCULAR | Status: DC
Start: ? — End: 2017-12-28

## 2017-12-28 MED ORDER — SACUBITRIL-VALSARTAN 24-26 MG PO TABS
1.00 | ORAL_TABLET | ORAL | Status: DC
Start: 2017-12-27 — End: 2017-12-28

## 2017-12-28 MED ORDER — ONDANSETRON HCL 4 MG/2ML IJ SOLN
4.00 | INTRAMUSCULAR | Status: DC
Start: ? — End: 2017-12-28

## 2017-12-28 MED ORDER — SENNA-DOCUSATE SODIUM 8.6-50 MG PO TABS
1.00 | ORAL_TABLET | ORAL | Status: DC
Start: 2017-12-27 — End: 2017-12-28

## 2017-12-28 MED ORDER — AMIODARONE HCL 200 MG PO TABS
200.00 | ORAL_TABLET | ORAL | Status: DC
Start: 2017-12-28 — End: 2017-12-28

## 2017-12-28 MED ORDER — APIXABAN 2.5 MG PO TABS
2.50 | ORAL_TABLET | ORAL | Status: DC
Start: 2017-12-27 — End: 2017-12-28

## 2017-12-28 MED ORDER — ONDANSETRON HCL 4 MG PO TABS
4.00 | ORAL_TABLET | ORAL | Status: DC
Start: ? — End: 2017-12-28

## 2017-12-28 MED ORDER — DIGOXIN 125 MCG PO TABS
125.00 | ORAL_TABLET | ORAL | Status: DC
Start: 2017-12-28 — End: 2017-12-28

## 2017-12-28 MED ORDER — OXYCODONE HCL 10 MG PO TABS
10.00 | ORAL_TABLET | ORAL | Status: DC
Start: ? — End: 2017-12-28

## 2017-12-28 MED ORDER — BENZOCAINE-MENTHOL 15-3.6 MG MT LOZG
1.00 | LOZENGE | OROMUCOSAL | Status: DC
Start: ? — End: 2017-12-28

## 2017-12-28 MED ORDER — DIPHENHYDRAMINE HCL 25 MG PO CAPS
25.00 | ORAL_CAPSULE | ORAL | Status: DC
Start: ? — End: 2017-12-28

## 2017-12-28 MED ORDER — POTASSIUM CHLORIDE CRYS ER 20 MEQ PO TBCR
40.00 | EXTENDED_RELEASE_TABLET | ORAL | Status: DC
Start: 2017-12-27 — End: 2017-12-28

## 2018-01-25 ENCOUNTER — Ambulatory Visit (HOSPITAL_COMMUNITY)
Admission: RE | Admit: 2018-01-25 | Discharge: 2018-01-25 | Disposition: A | Discharge status: Home / Self Care | Payer: BC Managed Care – PPO | Source: Ambulatory Visit | Attending: Internal Medicine | Admitting: Internal Medicine

## 2018-01-25 ENCOUNTER — Encounter (HOSPITAL_COMMUNITY): Payer: Self-pay | Admitting: Internal Medicine

## 2018-01-25 VITALS — BP 138/72 | HR 84 | Wt 301.8 lb

## 2018-01-25 DIAGNOSIS — Z86718 Personal history of other venous thrombosis and embolism: Secondary | ICD-10-CM | POA: Insufficient documentation

## 2018-01-25 DIAGNOSIS — I429 Cardiomyopathy, unspecified: Secondary | ICD-10-CM | POA: Diagnosis not present

## 2018-01-25 DIAGNOSIS — Z7901 Long term (current) use of anticoagulants: Secondary | ICD-10-CM | POA: Diagnosis not present

## 2018-01-25 DIAGNOSIS — I440 Atrioventricular block, first degree: Secondary | ICD-10-CM | POA: Insufficient documentation

## 2018-01-25 DIAGNOSIS — I872 Venous insufficiency (chronic) (peripheral): Secondary | ICD-10-CM | POA: Diagnosis not present

## 2018-01-25 DIAGNOSIS — I2782 Chronic pulmonary embolism: Secondary | ICD-10-CM | POA: Insufficient documentation

## 2018-01-25 DIAGNOSIS — Z79899 Other long term (current) drug therapy: Secondary | ICD-10-CM | POA: Diagnosis not present

## 2018-01-25 DIAGNOSIS — I5042 Chronic combined systolic (congestive) and diastolic (congestive) heart failure: Secondary | ICD-10-CM | POA: Diagnosis present

## 2018-01-25 DIAGNOSIS — Z6841 Body Mass Index (BMI) 40.0 and over, adult: Secondary | ICD-10-CM | POA: Insufficient documentation

## 2018-01-25 DIAGNOSIS — I480 Paroxysmal atrial fibrillation: Secondary | ICD-10-CM | POA: Diagnosis not present

## 2018-01-25 DIAGNOSIS — I471 Supraventricular tachycardia: Secondary | ICD-10-CM | POA: Insufficient documentation

## 2018-01-25 DIAGNOSIS — M109 Gout, unspecified: Secondary | ICD-10-CM | POA: Diagnosis not present

## 2018-01-25 DIAGNOSIS — I5022 Chronic systolic (congestive) heart failure: Secondary | ICD-10-CM | POA: Diagnosis not present

## 2018-01-25 LAB — BASIC METABOLIC PANEL
Anion gap: 11 (ref 5–15)
BUN: 12 mg/dL (ref 6–20)
CO2: 25 mmol/L (ref 22–32)
CREATININE: 1.15 mg/dL (ref 0.61–1.24)
Calcium: 8.4 mg/dL — ABNORMAL LOW (ref 8.9–10.3)
Chloride: 102 mmol/L (ref 98–111)
GFR calc Af Amer: >60 mL/min (ref >60)
GFR calc non Af Amer: >60 mL/min (ref >60)
Glucose, Bld: 124 mg/dL — ABNORMAL HIGH (ref 70–99)
Potassium: 4.7 mmol/L (ref 3.5–5.1)
Sodium: 138 mmol/L (ref 135–145)

## 2018-01-25 MED ORDER — CARVEDILOL 3.125 MG PO TABS: 3.1250 mg | ORAL_TABLET | Freq: Two times a day (BID) | ORAL | 6 refills | Status: DC

## 2018-01-25 MED ORDER — SACUBITRIL-VALSARTAN 49-51 MG PO TABS: 1.0000 | ORAL_TABLET | Freq: Two times a day (BID) | ORAL | 6 refills | Status: DC

## 2018-01-25 NOTE — Progress Notes (Signed)
Advanced Heart Failure Clinic Note   Patient ID: Roberto [name], male   DOB: July 23, 1965, 53 y.o.   MRN: [id number] PCP: Dr Jacqulyn Bath Cardiologist: Dr [name] EP: Dr Johney Frame Coumadin followed by PCP  HPI: Roberto [name] is a 53 y.o. male  with h/o systolic HF due [name] NICM (EF25-30% 2013 ), h/o recurrent DVT with resultant PE and chronic venous insufficiency, h/o LV thrombus (on Coumadin), PSVT (elected for medical therapy over RFA on EP follow-up) and morbid obesity.   Admitted [name] 12/16/11 for recurrent HF. Diuresed 20 pounds with IV lasix. See RHC/LHC results   Admitted 10/14/16-10/21/16 with AF and tachy induced cardiomyopathy. EF dropped from 40%-> 15%. Underwent DCCV on 10/17/16 with successful conversion [name] NSR. Started on amiodarone. Diuresed 12 pounds with IV lasix. Discharge weight was 279 pounds.  S/p AFL ablation 11/17/2016  Admitted 3/8 - 03/26/17. Pt underwent outpatient DCCV 03/20/17, then came back that evening with worsening SOB and CP. Diuresed with IV lasix. Also treated for cellulitis of his RLE, and gout. Discharge weight 280 lbs.  He presents today for regular follow up. Feeling good overall. Had knee surgery on TKR R knee on 12/25/2017. Has healed well. Has had dry cough x 2 weeks and is worried he has recurrent PE. Hasn't needed any extra torsemide. Denies dizziness or lightheadedness. Finished PT for his knee without difficulty. He was unable [name] schedule ICD appointment due [name] his knee surgery. Still working on getting around on the stationary bike (can't make a full rotation yet), but walking and using TM.   Echo 07/2017 EF appears slightly improved [name] 20-25%. Possible clot (vs artifact in IVC/RA junction) Personally reviewed  EKG 07/2017 Sinus rhythm 63 with 1st degree AV block.    Echo 10/15/16 - LVEF 15% TEE 03/20/17 LVEF 15-20%, Severe reduced RV function  RHC 10/20/16  RA = 3 RV = 45/7 PA = 49/20 (31) PCW = 28 Fick cardiac output/index = 5.0/2.1 PVR = 0.5 WU Ao sat =  95% PA sat = 64%, 61%  12/17/11 ECHO 25-30%  05/20/2012 ECHO EF 40-45%  CARDIAC MRI - 12/18/11  1. Moderately dilated left ventricle with moderately decreased systolic function, EF 38%. Global hypokinesis.  2. Mildly dilated right ventricle with mild [name] moderately decreased systolic function.  3. No definite LV thrombus noted.  4. Patchy non-subendocardial delayed enhancement seen in the ventricular septum (see above for description). This is not suggestive of a coronary disease pattern. This could be suggestive of infiltrative disease versus prior myocarditis.  RHC/LHC 12/22/11  Cors: Normal  SH: Lives with his wife and 5 children. Works full time running a Daycare  Review of systems complete and found [name] be negative unless listed in HPI.    Past Medical History:  Diagnosis Date  . Arthritis   . Chronic combined systolic and diastolic CHF (congestive heart failure) (HCC)   . Deep vein thrombosis (HCC)   . Lupus anticoagulant positive   . Morbid obesity (HCC)   . Mural thrombus of heart    coumadin  . Nonischemic cardiomyopathy (HCC)    a) 12/17/11 echo: LVEF 25-30%, grade 3 diastolic dysfunction (c/w restriction), mod MR, mod LA/LV and mild RA dilatation; b) 12/18/11 cMRI: LVEF 38%, mod LV/mild RV dilatation, global HK, mild-mod RV sys dysfxn, no LV thrombus & patchy non-subendocardial delayed enhancement c/w infil dz or prior myocarditis   . Paroxysmal SVT (supraventricular tachycardia) (HCC)   . Pulmonary embolism (HCC)    DVT and PE after  knee surgery in 2007    Current Outpatient Medications  Medication Sig Dispense Refill  . acetaminophen (TYLENOL) 500 MG tablet Take 500-1,000 mg by mouth every 6 (six) hours as needed for moderate pain or headache.     [name] amiodarone (PACERONE) 200 MG tablet Take 1 tablet (200 mg total) by mouth daily. 30 tablet 3  . apixaban (ELIQUIS) 5 MG TABS tablet Take 1 tablet (5 mg total) by mouth 2 (two) times daily. 60 tablet 11  . coconut oil OIL Apply  1 application topically daily.    [name] DIGOX 125 MCG tablet TAKE 1 TABLET(0.125 MG) BY MOUTH DAILY 90 tablet 1  . potassium chloride SA (K-DUR,KLOR-CON) 20 MEQ tablet Take 2 tablets (40 mEq total) by mouth 2 (two) times daily. 120 tablet 3  . sacubitril-valsartan (ENTRESTO) 24-26 MG Take 1 tablet by mouth 2 (two) times daily. 60 tablet 5  . sildenafil (VIAGRA) 100 MG tablet TAKE 1/2 [name] 1 TABLET(50 [name] 100 MG) BY MOUTH DAILY AS NEEDED FOR ERECTILE DYSFUNCTION 10 tablet 5  . spironolactone (ALDACTONE) 25 MG tablet Take 0.5 tablets (12.5 mg total) by mouth daily. 45 tablet 3  . torsemide (DEMADEX) 20 MG tablet Take 2 tablets (40 mg total) by mouth 2 (two) times daily. 120 tablet 5   No current facility-administered medications for this encounter.    PHYSICAL EXAM: Vitals:   01/25/18 1123  BP: 138/72  Pulse: 84  SpO2: 98%  Weight: (!) 136.9 kg (301 lb 12.8 oz)   Wt Readings from Last 3 Encounters:  01/25/18 (!) 136.9 kg (301 lb 12.8 oz)  11/17/17 132.5 kg (292 lb)  08/12/17 134.2 kg (295 lb 12.8 oz)    Physical Exam General: Well appearing. No resp difficulty. HEENT: Normal Neck: Supple. JVP 6-7 cm. Carotids 2+ bilat; no bruits. No thyromegaly or nodule noted. Cor: PMI nondisplaced. RRR, No M/G/R noted Lungs: CTAB, normal effort. Abdomen: Obese, soft, non-tender, non-distended, no HSM. No bruits or masses. +BS  Extremities: No cyanosis, clubbing, or rash. 1+ R > L LE edema in setting of recent TKR.  Neuro: Alert & orientedx3, cranial nerves grossly intact. moves all 4 extremities w/o difficulty. Affect pleasant   EKG NSR 74 bpm, QRS 116 ms, personally reviewed.   ASSESSMENT & PLAN:  1. Chronic Systolic Heart Failure: Recent Echo EF 15%, drop from 40-45% in 2016, felt [name] be related [name] Afib.  - Echo 10/15/16 - LVEF 15%, TEE 03/2017 LVEF 15-20% - Echo 07/2017 with LVEF 20-25%. Apparent clot visible in IVC.  - NYHA II symptoms. - Volume status stable on exam.   - Continue torsemide 40 mg  BID.  - Continue spiro 12.5 mg daily - Continue digoxin 0.125 mg daily. - Add carvedilol 3.125 bid - Increase Entresto [name] 49/51 mg BID.  - Reinforced fluid restriction [name] < 2 L daily, sodium restriction [name] less than 2000 mg daily, and the importance of daily weights.   - Needs [name] see EP for ICD consideration 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 - Maintaining NSR - Continue amiodarone 200 mg daily.  - Continue Eliquis 5 mg BID.  - Needs sleep study. Will wait until after ICD. He tends to do one thing at a time.   3. H/o DVT/PE - Continue Eliquis 5 mg BID.  - Echo 07/2017 with LVEF 20-25%. Apparent clot visible in IVC. No change.  - With cough and ? Chronic PE on CTA last year, will repeat.  4. Morbid Obestiy  -  Body mass index is 47.27 kg/m.  - Stressed importance of weight loss.  5. ED - OK [name] continue low dose sildenafil prn.  - Not on any long acting nitrates.  6. R knee injury -> Arthritis - s/p RTKR 12/25/2017. Stable.   Needs EP evaluation.   Graciella Freer, PA-C  11:40 AM   Patient seen and examined with the above-signed Advanced Practice Provider and/or Housestaff. I personally reviewed laboratory data, imaging studies and relevant notes. I independently examined the patient and formulated the important aspects of the plan. I have edited the note [name] reflect any of my changes or salient points. I have personally discussed the plan with the patient and/or family.  He is doing quite well despite severe LV dysfunction. NYHA II-early III. Volume status looks good.Will increase Entresto and add low-dose carvedilol as tolerated. He has increased cough recently after TKA. He says it is reminiscent of previous PE. I think he is low risk on Elqiuis but given recent surgery will check CT. Remains in NSR on amio. Will continue.   Arvilla Meres, MD  9:00 PM

## 2018-01-25 NOTE — Patient Instructions (Addendum)
INCREASE Entresto to 49/51mg  (1 tab) twice a day  START Coreg 3.125mg  (1 tab) twice day on 02/01/18 if you are tolerating increased dose of Entresto  Labs today and repeat in 10 days We will only contact you if something comes back abnormal or we need to make some changes. Otherwise no news is good news!  Your physician request that you have a CT scan of your chest. They will call you to schedule your appointment. If you do not receive a call. Please reach out to our office.   You have been referred to Electrophysiology to discuss placement of an ICD. They will call you to schedule.   Your physician recommends that you schedule a follow-up appointment in: 3-4 months. Please call us in February to schedule this appointment.

## 2018-01-30 ENCOUNTER — Telehealth (HOSPITAL_COMMUNITY): Payer: Self-pay | Admitting: Internal Medicine

## 2018-02-01 ENCOUNTER — Encounter: Payer: Self-pay | Admitting: *Deleted

## 2018-02-02 ENCOUNTER — Other Ambulatory Visit: Payer: Self-pay

## 2018-02-02 ENCOUNTER — Emergency Department (HOSPITAL_BASED_OUTPATIENT_CLINIC_OR_DEPARTMENT_OTHER): Payer: BC Managed Care – PPO

## 2018-02-02 ENCOUNTER — Encounter (HOSPITAL_BASED_OUTPATIENT_CLINIC_OR_DEPARTMENT_OTHER): Payer: Self-pay | Admitting: Emergency Medicine

## 2018-02-02 ENCOUNTER — Emergency Department (HOSPITAL_BASED_OUTPATIENT_CLINIC_OR_DEPARTMENT_OTHER)
Admission: EM | Admit: 2018-02-02 | Discharge: 2018-02-02 | Disposition: A | Discharge status: Home / Self Care | Payer: BC Managed Care – PPO | Attending: Emergency Medicine | Admitting: Emergency Medicine

## 2018-02-02 DIAGNOSIS — I5042 Chronic combined systolic (congestive) and diastolic (congestive) heart failure: Secondary | ICD-10-CM | POA: Insufficient documentation

## 2018-02-02 DIAGNOSIS — D6862 Lupus anticoagulant syndrome: Secondary | ICD-10-CM | POA: Diagnosis not present

## 2018-02-02 DIAGNOSIS — I259 Chronic ischemic heart disease, unspecified: Secondary | ICD-10-CM | POA: Insufficient documentation

## 2018-02-02 DIAGNOSIS — Z79899 Other long term (current) drug therapy: Secondary | ICD-10-CM | POA: Diagnosis not present

## 2018-02-02 DIAGNOSIS — Z7901 Long term (current) use of anticoagulants: Secondary | ICD-10-CM | POA: Diagnosis not present

## 2018-02-02 DIAGNOSIS — N183 Chronic kidney disease, stage 3 (moderate): Secondary | ICD-10-CM | POA: Insufficient documentation

## 2018-02-02 DIAGNOSIS — M79671 Pain in right foot: Secondary | ICD-10-CM | POA: Diagnosis present

## 2018-02-02 NOTE — Discharge Instructions (Addendum)
Your history and physical examination and x-rays were consistent with plantar fasciitis.  There was no sign of fracture on your x-rays today.  Take [phone] mg of Tylenol every 6 hours as needed for pain for the next few days. Do not exceed 4000 mg of Tylenol daily.   Do some gentle stretching exercises in the morning and throughout the day to help with your pain.  You can freeze a water bottle and then roll it gently over the bottom of your foot.  I have also attached instructions on how to tape your feet which can help ease your pain.  Wear the CAM walker for comfort.  Follow-up with podiatry for reevaluation of your symptoms.  They may recommend injecting a steroid medicine or custom orthotics.  Make sure to wear shoes that fit well.  Return to the emergency department if any concerning signs or symptoms develop such as fevers, severe swelling, loss of pulses or pallor to the extremity, or persistent numbness/weakness.

## 2018-02-02 NOTE — ED Provider Notes (Signed)
[facility] EMERGENCY DEPARTMENT Provider Note   CSN: [id number] Arrival date & time: 02/02/18  1014     History   Chief Complaint Chief Complaint  Patient presents with  . Foot Pain    HPI Roberto [name] is a 53 y.o. male with history of arthritis, chronic CHF, DVT, lupus anticoagulant positive, morbid obesity, paroxysmal SVT, PE presents for evaluation of acute onset, intermittent right foot pain for 3 to 4 days.  He notes a sharp pain to the dorsum of the foot and the plantar aspect of the foot with any attempts to bear weight or ambulate.  He denies any pain at rest and his pain improves when he is not moving.  He has used BenGay, icy hot, applied ice, and warm baths with some improvement.  Denies numbness or tingling.  Recently underwent right knee replacement on 12/25/2017 which he tolerated well and is currently doing physical therapy exercises which he believes may be aggravating his symptoms.  He reports he has had similar pain in the past but never to this degree.  He does have a walker at home.  He reports this does not feel like when he had a DVT in the past and states he has been compliant with his Eliquis. Denies chest pain or shortness of breath.   The history is provided by the patient.    Past Medical History:  Diagnosis Date  . Arthritis   . Chronic combined systolic and diastolic CHF (congestive heart failure) (HCC)   . Deep vein thrombosis (HCC)   . Lupus anticoagulant positive   . Morbid obesity (HCC)   . Mural thrombus of heart    coumadin  . Nonischemic cardiomyopathy (HCC)    a) 12/17/11 echo: LVEF 25-30%, grade 3 diastolic dysfunction (c/w restriction), mod MR, mod LA/LV and mild RA dilatation; b) 12/18/11 cMRI: LVEF 38%, mod LV/mild RV dilatation, global HK, mild-mod RV sys dysfxn, no LV thrombus & patchy non-subendocardial delayed enhancement c/w infil dz or prior myocarditis   . Paroxysmal SVT (supraventricular tachycardia) (HCC)   . 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 Atypical atrial flutter (HCC) 03/17/2017  . Chronic pain of right knee 03/13/2017  . Typical atrial flutter (HCC)   . Varicose veins of legs 12/17/2015  . Encounter for health maintenance examination in adult 12/17/2015  . Special screening for malignant neoplasms, colon 12/17/2015  . Vaccine counseling 12/17/2015  . Screening for prostate cancer 12/17/2015  . Knee deformity, acquired, right 12/17/2015  . Gait disturbance 12/17/2015  . Osteoarthritis of right knee 12/17/2015  . Varicose veins of lower extremities with ulcer (HCC) 09/18/2015  . Need for prophylactic vaccination and inoculation against influenza 09/18/2015  . Impaired fasting blood sugar 12/28/2014  . Long term current use of anticoagulant therapy 12/28/2014  . Erectile dysfunction 02/19/2012  . Chronic systolic heart failure (HCC) 12/29/2011  . Hypokalemia 12/24/2011  . Lupus anticoagulant positive 12/22/2011  . Nonischemic cardiomyopathy (HCC) 12/16/2011  . Paroxysmal SVT (supraventricular tachycardia) (HCC) 12/16/2011  . History of venous thromboembolism 12/16/2011  . Venous insufficiency 11/04/2011  . Obesity, morbid (HCC) 06/02/2008    Past Surgical History:  Procedure Laterality Date  . A-FLUTTER ABLATION N/A 11/17/2016   Procedure: A-FLUTTER ABLATION;  Surgeon: Regan Lemmingamnitz, Will Martin, MD;  Location: [facility] INVASIVE CV LAB;  Service: Cardiovascular;  Laterality: N/A;  . CARDIAC CATHETERIZATION  06/2006   Angiographically normal cors  . CARDIAC CATHETERIZATION  12/22/2011   R/LHC: normal cors, well-compensated HDs, LV dysfxn  . CARDIOVERSION N/A 10/17/2016   Procedure: CARDIOVERSION;  Surgeon: Dolores Patty, MD;  Location: [facility] ENDOSCOPY;  Service: Cardiovascular;  Laterality: N/A;  . CARDIOVERSION N/A 03/20/2017   Procedure: CARDIOVERSION;  Surgeon: Dolores Patty, MD;  Location: [facility] ENDOSCOPY;   Service: Cardiovascular;  Laterality: N/A;  . LEFT AND RIGHT HEART CATHETERIZATION WITH CORONARY ANGIOGRAM N/A 12/22/2011   Procedure: LEFT AND RIGHT HEART CATHETERIZATION WITH CORONARY ANGIOGRAM;  Surgeon: Dolores Patty, MD;  Location: [facility] CATH LAB;  Service: Cardiovascular;  Laterality: N/A;  . PATELLAR TENDON REPAIR     Left  . RIGHT HEART CATH N/A 10/20/2016   Procedure: RIGHT HEART CATH;  Surgeon: Dolores Patty, MD;  Location: [facility] INVASIVE CV LAB;  Service: Cardiovascular;  Laterality: N/A;  . TEE WITHOUT CARDIOVERSION N/A 03/20/2017   Procedure: TRANSESOPHAGEAL ECHOCARDIOGRAM (TEE);  Surgeon: Dolores Patty, MD;  Location: [facility] ENDOSCOPY;  Service: Cardiovascular;  Laterality: N/A;        Home Medications    Prior to Admission medications   Medication Sig Start Date End Date Taking? Authorizing Provider  acetaminophen (TYLENOL) 500 MG tablet Take 500-1,000 mg by mouth every 6 (six) hours as needed for moderate pain or headache.     [provider]  amiodarone (PACERONE) 200 MG tablet Take 1 tablet (200 mg total) by mouth daily. 08/10/17   Bensimhon, Bevelyn Buckles, MD  apixaban (ELIQUIS) 5 MG TABS tablet Take 1 tablet (5 mg total) by mouth 2 (two) times daily. 03/17/17   Graciella Freer, PA-C  carvedilol (COREG) 3.125 MG tablet Take 1 tablet (3.125 mg total) by mouth 2 (two) times daily with a meal. 01/25/18   Bensimhon, Bevelyn Buckles, MD  coconut oil OIL Apply 1 application topically daily.    [provider]  DIGOX 125 MCG tablet TAKE 1 TABLET(0.125 MG) BY MOUTH DAILY 11/02/17   Bensimhon, Bevelyn Buckles, MD  potassium chloride SA (K-DUR,KLOR-CON) 20 MEQ tablet TAKE 2 TABLETS BY MOUTH TWICE DAILY 02/01/18   Bensimhon, Bevelyn Buckles, MD  sacubitril-valsartan (ENTRESTO) 49-51 MG Take 1 tablet by mouth 2 (two) times daily. 01/25/18   Bensimhon, Bevelyn Buckles, MD  sildenafil (VIAGRA) 100 MG tablet TAKE 1/2 TO 1 TABLET(50 TO 100 MG) BY MOUTH DAILY AS NEEDED FOR ERECTILE DYSFUNCTION  07/13/17   [name], MD  spironolactone (ALDACTONE) 25 MG tablet Take 0.5 tablets (12.5 mg total) by mouth daily. 11/13/16   Bensimhon, Bevelyn Buckles, MD  torsemide (DEMADEX) 20 MG tablet Take 2 tablets (40 mg total) by mouth 2 (two) times daily. 10/29/17   Bensimhon, Bevelyn Buckles, MD  warfarin (COUMADIN) 2 MG tablet Take 1 tablet (2 mg total) by mouth daily. 03/13/17 03/17/17  Tysinger, Kermit Balo, PA-C    Family History Family History  Problem Relation Age of Onset  . Hypertension Mother   . Clotting disorder Mother        mom with PEs  . Cancer Mother        uterine cancer  . Diabetes Father   . Heart attack Father   . Hypertension Other        Sibling  . Diabetes Other   . Obesity Other   . Diabetes Sister   . Obesity Sister   . Obesity Sister   . Colon cancer Neg Hx   . Stomach cancer Neg Hx   .  Esophageal cancer Neg Hx   . Rectal cancer Neg Hx   . Liver cancer Neg Hx     Social History Social History   Tobacco Use  . Smoking status: Never Smoker  . Smokeless tobacco: Never Used  Substance Use Topics  . Alcohol use: No    Comment: Rarely  . Drug use: No     Allergies   Shrimp [shellfish allergy]   Review of Systems Review of Systems  Constitutional: Negative for fever.  Respiratory: Negative for shortness of breath.   Cardiovascular: Negative for chest pain.  Musculoskeletal: Positive for arthralgias.  Neurological: Negative for weakness and numbness.     Physical Exam Updated Vital Signs BP 95/61 (BP Location: Left Arm)   Pulse 80   Temp 98.4 F (36.9 C) (Oral)   Resp 16   Ht 5\' 8"  (1.727 m)   Wt 133.8 kg   SpO2 97%   BMI 44.85 kg/m   Physical Exam Vitals signs and nursing note reviewed.  Constitutional:      General: He is not in acute distress.    Appearance: He is well-developed.  HENT:     Head: Normocephalic and atraumatic.  Eyes:     General:        Right eye: No discharge.        Left eye: No discharge.     Conjunctiva/sclera:  Conjunctivae normal.  Neck:     Vascular: No JVD.     Trachea: No tracheal deviation.  Cardiovascular:     Rate and Rhythm: Normal rate.     Comments: Chronic venous stasis changes of the left lower extremity.  1+ DP/PT pulses of the left lower extremity which patient reports is chronic and unchanged.  No tenderness to palpation of the extremity.  Trace pitting edema to the dorsum of the right foot.  2+ DP/PT pulses of the right lower extremity.  Homans sign absent bilaterally. Pulmonary:     Effort: Pulmonary effort is normal.  Abdominal:     General: There is no distension.  Musculoskeletal:     Comments: Well-healed surgical scar to the right knee.  Diffuse tenderness to palpation of the right foot but maximally tender to palpation along the plantar aspect.  Trace pitting edema to the dorsum of the foot. Tenderness to palpation between the MTP joints along the dorsum of the foot. 5/5 strength of BLE major muscle groups.  No erythema, deformity, or crepitus noted.  Skin:    Findings: No erythema.  Neurological:     General: No focal deficit present.     Mental Status: He is alert.     Comments: Fluent speech, no facial droop. Sensation intact to soft touch of bilateral lower extremities.  Psychiatric:        Behavior: Behavior normal.      ED Treatments / Results  Labs (all labs ordered are listed, but only abnormal results are displayed) Labs Reviewed - No data to display  EKG None  Radiology Dg Foot Complete Right  Result Date: 02/02/2018 CLINICAL DATA:  53 year old male with foot pain for 3 days with no known injury. EXAM: RIGHT FOOT COMPLETE - 3+ VIEW COMPARISON:  Right foot series 10/10/2016. FINDINGS: Bone mineralization is within normal limits. Stable joint spaces and alignment including mild 1st MTP joint space loss associated with a degree of hallux valgus. No acute osseous abnormality identified. Small chronic ossific fragments at the medial malleolus. Soft tissues  appears stable and within normal limits. IMPRESSION: Stable  radiographic appearance of the right foot since 2018. Electronically Signed   By: Odessa Fleming M.D.   On: 02/02/2018 10:43    Procedures Procedures (including critical care time)  Medications Ordered in ED Medications - No data to display   Initial Impression / Assessment and Plan / ED Course  I have reviewed the triage vital signs and the nursing notes.  Pertinent labs & imaging results that were available during my care of the patient were reviewed by me and considered in my medical decision making (see chart for details).     Patient presenting for evaluation of intermittent foot pain for 4 days.  He is 5 and half weeks status post right knee replacement and has been doing increased workouts and physical therapy.  He is afebrile, vital signs are stable.  He is nontoxic in appearance.  He is neurovascularly intact.  History and physical examination do not suggest DVT.  He is also been compliant with his Eliquis.  He denies shortness of breath and I have a low suspicion of PE or ACS/MI.  No evidence of septic joint or secondary skin infection.  Radiographs show no acute osseous abnormality.  His presentation and physical examination suggest possible plantar fasciitis. RICE therapy dictated and discussed with patient.  Will discharge with a cam walker.  He will continue using his walker to help him ambulate.  Recommend follow-up with orthopedist for reevaluation of his symptoms.  Discussed strict ED return precautions.  Patient and daughter verbalized understanding of and agreement with plan and patient stable for discharge home at this time.  Final Clinical Impressions(s) / ED Diagnoses   Final diagnoses:  Acute pain of right foot    ED Discharge Orders    None       Bennye Alm 02/02/18 1127    Maia Plan, MD 02/02/18 [phone]

## 2018-02-02 NOTE — ED Triage Notes (Addendum)
Reports right foot pain x 3-4 days.  Unsure if he injured this or not.  Reports he has been in the gym working out.  Additionally states he had a right knee replacement on 12/26/18.

## 2018-02-04 ENCOUNTER — Ambulatory Visit (HOSPITAL_COMMUNITY)
Admission: RE | Admit: 2018-02-04 | Discharge: 2018-02-04 | Disposition: A | Discharge status: Home / Self Care | Payer: BC Managed Care – PPO | Source: Ambulatory Visit | Attending: Cardiology | Admitting: Cardiology

## 2018-02-04 DIAGNOSIS — I5022 Chronic systolic (congestive) heart failure: Secondary | ICD-10-CM | POA: Diagnosis present

## 2018-02-04 LAB — BASIC METABOLIC PANEL
Anion gap: 10 (ref 5–15)
BUN: 15 mg/dL (ref 6–20)
CO2: 23 mmol/L (ref 22–32)
Calcium: 8.8 mg/dL — ABNORMAL LOW (ref 8.9–10.3)
Chloride: 106 mmol/L (ref 98–111)
Creatinine, Ser: 1.25 mg/dL — ABNORMAL HIGH (ref 0.61–1.24)
GFR calc Af Amer: >60 mL/min (ref >60)
Glucose, Bld: 110 mg/dL — ABNORMAL HIGH (ref 70–99)
Potassium: 3.8 mmol/L (ref 3.5–5.1)
Sodium: 139 mmol/L (ref 135–145)

## 2018-02-12 ENCOUNTER — Encounter: Payer: Self-pay | Admitting: Cardiology

## 2018-02-12 ENCOUNTER — Ambulatory Visit: Payer: BC Managed Care – PPO | Admitting: Cardiology

## 2018-02-12 VITALS — BP 120/70 | HR 76 | Ht 68.0 in | Wt 303.0 lb

## 2018-02-12 DIAGNOSIS — I5022 Chronic systolic (congestive) heart failure: Secondary | ICD-10-CM

## 2018-02-12 DIAGNOSIS — I483 Typical atrial flutter: Secondary | ICD-10-CM | POA: Diagnosis not present

## 2018-02-12 NOTE — Patient Instructions (Signed)
Medication Instructions:  Your physician recommends that you continue on your current medications as directed. Please refer to the Current Medication list given to you today.     * If you need a refill on your cardiac medications before your next appointment, please call your pharmacy. *   Labwork: Pre procedure lab work on ________ * Will notify you of abnormal results, otherwise continue current treatment plan.*   Testing/Procedures: Your physician has recommended that you have a defibrillator inserted. An implantable cardioverter defibrillator (ICD) is a small device that is placed in your chest or, in rare cases, your abdomen. This device uses electrical pulses or shocks to help control life-threatening, irregular heartbeats that could lead the heart to suddenly stop beating (sudden cardiac arrest). Leads are attached to the ICD that goes into your heart. This is done in the hospital and usually requires an overnight stay. Please follow the instructions below, located under the special instructions section.  The following dates are available (these are subject to change): 2/13,  2/27, 3/4, 3/5, 3/18, 3/19, 3/25, 3/26  Please Analissa Bayless, RN when you are ready to schedule (I have held 2/13 for  you)  Follow-Up: Your physician recommends that you schedule a wound check appointment 10-14 days, after your procedure on _____-, with the device clinic.  Your physician recommends that you schedule a follow up appointment in 91 days, after your procedure on _______, with Dr. Elberta Fortisamnitz.  * Please note that any paperwork needing to be filled out by the provider will need to be addressed at the front desk prior to seeing the provider.  Please note that any FMLA, disability or other documents regarding health condition is subject to a $25.00 charge that must be received prior to completion of paperwork in the form of a money order or check. *  Thank you for choosing [facility]!!   Dory HornSherri Isola Mehlman,  RN [phone]   Any Other Special Instructions Will Be Listed Below (If Applicable).     Implantable Device Instructions  You are scheduled for:                  _____ Implantable Cardioverter Defibrillator  on  _______  with Dr. Elberta Fortisamnitz.  1.   [facility]  [name] at  ________ on the day of your procedure. (The address is [address])  2. Do not eat or drink after midnight the night before your procedure.  3.   Complete pre procedure  lab work on ________.  The lab at [facility] is open from 8:00 AM to 4:30 PM.  You do not have to be fasting.  4.   Medications instructions:    5.  Plan for an overnight stay.  Bring your insurance cards and a list of you medications.  6.  Wash your chest and neck with surgical scrub the evening before and the morning of  your procedure.  Rinse well. Please review the surgical scrub instruction sheet given to you.  7. Your chest will need to be shaved prior to this procedure (if needed). We ask that you do this yourself at home 1 to 2 days before or if uncomfortable/unable to do yourself, then it will be performed by the hospital staff the day of.                                                                                                                *  If you have ANY questions after you get home, please call Dory Horn, RN @ [phone].  * Every attempt is made to prevent procedures from being rescheduled.  Due to the nature of  Electrophysiology, rescheduling can happen.  The physician is always aware and directs the staff when this occurs.        Cardioverter Defibrillator Implantation An implantable cardioverter defibrillator (ICD) is a small, lightweight, battery-powered device that is placed (implanted) under the skin in the chest or abdomen. Your caregiver may prescribe an ICD if:  You have had an irregular heart rhythm (arrhythmia) that  originated in the lower chambers of the heart (ventricles).  Your heart has been damaged by a disease (such as coronary artery disease) or heart condition (such as a heart attack). An ICD consists of a battery that lasts several years, a small computer called a pulse generator, and wires called leads that go into the heart. It is used to detect and correct two dangerous arrhythmias: a rapid heart rhythm (tachycardia) and an arrhythmia in which the ventricles contract in an uncoordinated way (fibrillation). When an ICD detects tachycardia, it sends an electrical signal to the heart that restores the heartbeat to normal (cardioversion). This signal is usually painless. If cardioversion does not work or if the ICD detects fibrillation, it delivers a small electrical shock to the heart (defibrillation) to restart the heart. The shock may feel like a strong jolt in the chest. ICDs may be programmed to correct other problems. Sometimes, ICDs are programmed to act as another type of implantable device called a pacemaker. Pacemakers are used to treat a slow heartbeat (bradycardia). LET YOUR CAREGIVER KNOW ABOUT:  Any allergies you have.  All medicines you are taking, including vitamins, herbs, eyedrops, and over-the-counter medicines and creams.  Previous problems you or members of your family have had with the use of anesthetics.  Any blood disorders you have had.  Other health problems you have. RISKS AND COMPLICATIONS Generally, the procedure to implant an ICD is safe. However, as with any surgical procedure, complications can occur. Possible complications associated with implanting an ICD include:  Swelling, bleeding, or bruising at the site where the ICD was implanted.  Infection at the site where the ICD was implanted.  A reaction to medicine used during the procedure.  Nerve, heart, or blood vessel damage.  Blood clots. BEFORE THE PROCEDURE  You may need to have blood tests, heart tests,  or a chest X-ray done before the day of the procedure.  Ask your caregiver about changing or stopping your regular medicines.  Make plans to have someone drive you home. You may need to stay in the hospital overnight after the procedure.  Stop smoking at least 24 hours before the procedure.  Take a bath or shower the night before the procedure. You may need to scrub your chest or abdomen with a special type of soap.  Do not eat or drink before your procedure for as long as directed by your caregiver. Ask if it is okay to take any needed medicine with a small sip of water. PROCEDURE  The procedure to implant an ICD in your chest or abdomen is usually done at a hospital in a room that has a large X-ray machine called a fluoroscope. The machine will be above you during the procedure. It will help your caregiver see your heart during the procedure. Implanting an ICD usually takes 1-3 hours. Before the procedure:   Small monitors will be  put on your body. They will be used to check your heart, blood pressure, and oxygen level.  A needle will be put into a vein in your hand or arm. This is called an intravenous (IV) access tube. Fluids and medicine will flow directly into your body through the IV tube.  Your chest or abdomen will be cleaned with a germ-killing (antiseptic) solution. The area may be shaved.  You may be given medicine to help you relax (sedative).  You will be given a medicine called a local anesthetic. This medicine will make the surgical site numb while the ICD is implanted. You will be sleepy but awake during the procedure. After you are numb the procedure will begin. The caregiver will:  Make a small cut (incision). This will make a pocket deep under your skin that will hold the pulse generator.  Guide the leads through a large blood vessel into your heart and attach them to the heart muscles. Depending on the ICD, the leads may go into one ventricle or they may go to both  ventricles and into an upper chamber of the heart (atrium).  Test the ICD.  Close the incision with stitches, glue, or staples. AFTER THE PROCEDURE  You may feel pain. Some pain is normal. It may last a few days.  You may stay in a recovery area until the local anesthetic has worn off. Your blood pressure and pulse will be checked often. You will be taken to a room where your heart will be monitored.  A chest X-ray will be taken. This is done to check that the cardioverter defibrillator is in the right place.  You may stay in the hospital overnight.  A slight bump may be seen over the skin where the ICD was placed. Sometimes, it is possible to feel the ICD under the skin. This is normal.  In the months and years afterward, your caregiver will check the device, the leads, and the battery every few months. Eventually, when the battery is low, the ICD will be replaced.   This information is not intended to replace advice given to you by your health care provider. Make sure you discuss any questions you have with your health care provider.   Document Released: 09/21/2001 Document Revised: 10/20/2012 Document Reviewed: 01/19/2012 Elsevier Interactive Patient Education 2016 [employer]    Cardioverter Defibrillator Implantation, Care After This sheet gives you information about how to care for yourself after your procedure. Your health care provider may also give you more specific instructions. If you have problems or questions, contact your health care provider. What can I expect after the procedure? After the procedure, it is common to have:  Some pain. It may last a few days.  A slight bump over the skin where the device was placed. Sometimes, it is possible to feel the device under the skin. This is normal.  During the months and years after your procedure, your health care provider will check the device, the leads, and the battery every few months. Eventually, when the battery is  low, the device will be replaced. Follow these instructions at home: Medicines  Take over-the-counter and prescription medicines only as told by your health care provider.  If you were prescribed an antibiotic medicine, take it as told by your health care provider. Do not stop taking the antibiotic even if you start to feel better. Incision care   Follow instructions from your health care provider about how to take care of your incision  area. Make sure you: ? Wash your hands with soap and water before you change your bandage (dressing). If soap and water are not available, use hand sanitizer. ? Change your dressing as told by your health care provider. ? Leave stitches (sutures), skin glue, or adhesive strips in place. These skin closures may need to stay in place for 2 weeks or longer. If adhesive strip edges start to loosen and curl up, you may trim the loose edges. Do not remove adhesive strips completely unless your health care provider tells you to do that.  Check your incision area every day for signs of infection. Check for: ? More redness, swelling, or pain. ? More fluid or blood. ? Warmth. ? Pus or a bad smell.  Do not use lotions or ointments near the incision area unless told by your health care provider.  Keep the incision area clean and dry for 2-3 days after the procedure or for as long as told by your health care provider. It takes several weeks for the incision site to heal completely.  Do not take baths, swim, or use a hot tub until your health care provider approves. Activity  Try to walk a little every day. Exercising is important after this procedure. Also, use your shoulder on the side of the defibrillator in daily tasks that do not require a lot of motion.  For at least 6 weeks: ? Do not lift your upper arm above your shoulders. This means no tennis, golf, or swimming for this period of time. If you tend to sleep with your arm above your head, use a restraint to  prevent this during sleep. ? Avoid sudden jerking, pulling, or chopping movements that pull your upper arm far away from your body.  Ask your health care provider when you may go back to work.  Check with your health care provider before you start to drive or play sports. Electric and magnetic fields  Tell all health care providers that you have a defibrillator. This may prevent them from giving you an MRI scan because strong magnets are used for that test.  If you must pass through a metal detector, quickly walk through it. Do not stop under the detector, and do not stand near it.  Avoid places or objects that have a strong electric or magnetic field, including: ? Airport [place] gates. At the airport, let officials know that you have a defibrillator. Your defibrillator ID card will let you be checked in a way that is safe for you and will not damage your defibrillator. Also, do not let a security person wave a magnetic wand near your defibrillator. That can make it stop working. ? Power plants. ? Large electrical generators. ? Anti-theft systems or electronic article surveillance (EAS). ? Radiofrequency transmission towers, such as cell phone and radio towers.  Do not use amateur (ham) radio equipment or electric (arc) welding torches. Some devices are safe to use if held at least 12 inches (30 cm) from your defibrillator. These include power tools, lawn mowers, and speakers. If you are unsure if something is safe to use, ask your health care provider.  Do not use MP3 player headphones. They have magnets.  You may safely use electric blankets, heating pads, computers, and microwave ovens.  When using your cell phone, hold it to the ear that is on the opposite side from the defibrillator. Do not leave your cell phone in a pocket over the defibrillator. General instructions  Follow diet instructions from  your health care provider, if this applies.  Always keep your defibrillator ID  card with you. The card should list the implant date, device model, and manufacturer. Consider wearing a medical alert bracelet or necklace.  Have your defibrillator checked every 3-6 months or as often as told by your health care provider. Most defibrillators last for 4-8 years.  Keep all follow-up visits as told by your health care provider. This is important for your health care provider to make sure your chest is healing the way it should. Ask your health care provider when you should come back to have your stitches or staples taken out. Contact a health care provider if:  You feel one shock in your chest.  You gain weight suddenly.  Your legs or feet swell more than they have before.  It feels like your heart is fluttering or skipping beats (heart palpitations).  You have more redness, swelling, or pain around your incision.  You have more fluid or blood coming from your incision.  Your incision feels warm to the touch.  You have pus or a bad smell coming from your incision.  You have a fever. Get help right away if:  You have chest pain.  You feel more than one shock.  You feel more short of breath than you have felt before.  You feel more light-headed than you have felt before.  Your incision starts to open up. This information is not intended to replace advice given to you by your health care provider. Make sure you discuss any questions you have with your health care provider. Document Released: 07/19/2004 Document Revised: 07/20/2015 Document Reviewed: 06/06/2015 Elsevier Interactive Patient Education  2018 [employer].     Supplemental Discharge Instructions for  Pacemaker/Defibrillator Patients  ACTIVITY No heavy lifting or vigorous activity with your left/right arm for 6 to 8 weeks.  Do not raise your left/right arm above your head for one week.  Gradually raise your affected arm as drawn below.           __  NO DRIVING for     ; you may begin  driving on     .  WOUND CARE - Keep the wound area clean and dry.  Do not get this area wet for one week. No showers for one week; you may shower on     . - The tape/steri-strips on your wound will fall off; do not pull them off.  No bandage is needed on the site.  DO  NOT apply any creams, oils, or ointments to the wound area. - If you notice any drainage or discharge from the wound, any swelling or bruising at the site, or you develop a fever > 101? F after you are discharged home, call the office at once.  SPECIAL INSTRUCTIONS - You are still able to use cellular telephones; use the ear opposite the side where you have your pacemaker/defibrillator.  Avoid carrying your cellular phone near your device. - When traveling through airports, show security personnel your identification card to avoid being screened in the metal detectors.  Ask the security personnel to use the hand wand. - Avoid arc welding equipment, MRI testing (magnetic resonance imaging), TENS units (transcutaneous nerve stimulators).  Call the office for questions about other devices. - Avoid electrical appliances that are in poor condition or are not properly grounded. - Microwave ovens are safe to be near or to operate.  ADDITIONAL INFORMATION FOR DEFIBRILLATOR PATIENTS SHOULD YOUR DEVICE GO OFF: -  If your device goes off ONCE and you feel fine afterward, notify the device clinic nurses. - If your device goes off ONCE and you do not feel well afterward, call 911. - If your device goes off TWICE, call 911. - If your device goes off [place], call 911.  DO NOT DRIVE YOURSELF OR [name] WITH A DEFIBRILLATOR TO THE HOSPITAL-CALL 911.

## 2018-02-12 NOTE — Progress Notes (Signed)
Electrophysiology Office Note   Date:  02/12/2018   ID:  CAVALLI PACIS, DOB [birth year], MRN [id number]  PCP:  [name], MD  Cardiologist:  Bensimhon Primary Electrophysiologist:  Oland Arquette Jorja Loa, MD    No chief complaint on file.    History of Present Illness: Roberto [name] is a 53 y.o. male who is being seen today for the evaluation of atrial flutter at the request of [name], MD. Presenting today for electrophysiology evaluation. 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 He was admitted on 10/14/16 with atrial fibrillation and tachycardia-induced myopathy. His EF dropped from 40% to 15%. He underwent cardioversion to sinus rhythm and was placed on amiodarone. He was diuresed 12 pounds with a discharge weight of 279 pounds. He returned to her failure clinic in atrial flutter with a rate of 115.  And atrial flutter ablation on 11/17/16.  He underwent cardioversion for atrial fibrillation 03/20/2017.  Today, denies symptoms of palpitations, chest pain, shortness of breath, orthopnea, PND, lower extremity edema, claudication, dizziness, presyncope, syncope, bleeding, or neurologic sequela. The patient is tolerating medications without difficulties.  He recently had a knee replacement.  He is healing well from that.  He also had a questionable thrombus in his SVC.  He has a CT scan planned for February.   Past Medical History:  Diagnosis Date  . Arthritis   . Chronic combined systolic and diastolic CHF (congestive heart failure) (HCC)   . Deep vein thrombosis (HCC)   . Lupus anticoagulant positive   . Morbid obesity (HCC)   . Mural thrombus of heart    coumadin  . Nonischemic cardiomyopathy (HCC)    a) 12/17/11 echo: LVEF 25-30%, grade 3 diastolic dysfunction (c/w restriction), mod MR, mod LA/LV and mild RA dilatation; b)  12/18/11 cMRI: LVEF 38%, mod LV/mild RV dilatation, global HK, mild-mod RV sys dysfxn, no LV thrombus & patchy non-subendocardial delayed enhancement c/w infil dz or prior myocarditis   . Paroxysmal SVT (supraventricular tachycardia) (HCC)   . Pulmonary embolism (HCC)    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   Past Surgical History:  Procedure Laterality Date  . A-FLUTTER ABLATION N/A 11/17/2016   Procedure: A-FLUTTER ABLATION;  Surgeon: Regan Lemming, MD;  Location: [facility] INVASIVE CV LAB;  Service: Cardiovascular;  Laterality: N/A;  . CARDIAC CATHETERIZATION  06/2006   Angiographically normal cors  . CARDIAC CATHETERIZATION  12/22/2011   R/LHC: normal cors, well-compensated HDs, LV dysfxn  . CARDIOVERSION N/A 10/17/2016   Procedure: CARDIOVERSION;  Surgeon: Dolores Patty, MD;  Location: [facility] ENDOSCOPY;  Service: Cardiovascular;  Laterality: N/A;  . CARDIOVERSION N/A 03/20/2017   Procedure: CARDIOVERSION;  Surgeon: Dolores Patty, MD;  Location: [facility] ENDOSCOPY;  Service: Cardiovascular;  Laterality: N/A;  . LEFT AND RIGHT HEART CATHETERIZATION WITH CORONARY ANGIOGRAM N/A 12/22/2011   Procedure: LEFT AND RIGHT HEART CATHETERIZATION WITH CORONARY ANGIOGRAM;  Surgeon: Dolores Patty, MD;  Location: [facility] CATH LAB;  Service: Cardiovascular;  Laterality: N/A;  . PATELLAR TENDON REPAIR     Left  . RIGHT HEART CATH N/A 10/20/2016   Procedure: RIGHT HEART CATH;  Surgeon: Dolores Patty, MD;  Location: [facility] INVASIVE CV LAB;  Service: Cardiovascular;  Laterality: N/A;  . TEE WITHOUT CARDIOVERSION N/A 03/20/2017   Procedure: TRANSESOPHAGEAL ECHOCARDIOGRAM (TEE);  Surgeon: Dolores Patty, MD;  Location:  [facility] ENDOSCOPY;  Service: Cardiovascular;  Laterality: N/A;     Current Outpatient Medications  Medication Sig Dispense Refill  . acetaminophen (TYLENOL) 500 MG tablet Take 500-1,000 mg by mouth every 6 (six) hours as needed for moderate pain or headache.     [name] amiodarone (PACERONE) 200 MG  tablet Take 1 tablet (200 mg total) by mouth daily. 30 tablet 3  . apixaban (ELIQUIS) 5 MG TABS tablet Take 1 tablet (5 mg total) by mouth 2 (two) times daily. 60 tablet 11  . carvedilol (COREG) 3.125 MG tablet Take 1 tablet (3.125 mg total) by mouth 2 (two) times daily with a meal. 60 tablet 6  . coconut oil OIL Apply 1 application topically daily.    [name] DIGOX 125 MCG tablet TAKE 1 TABLET(0.125 MG) BY MOUTH DAILY 90 tablet 1  . potassium chloride SA (K-DUR,KLOR-CON) 20 MEQ tablet TAKE 2 TABLETS BY MOUTH TWICE DAILY 120 tablet 3  . sacubitril-valsartan (ENTRESTO) 49-51 MG Take 1 tablet by mouth 2 (two) times daily. 60 tablet 6  . sildenafil (VIAGRA) 100 MG tablet TAKE 1/2 TO 1 TABLET(50 TO 100 MG) BY MOUTH DAILY AS NEEDED FOR ERECTILE DYSFUNCTION 10 tablet 5  . spironolactone (ALDACTONE) 25 MG tablet Take 0.5 tablets (12.5 mg total) by mouth daily. 45 tablet 3  . torsemide (DEMADEX) 20 MG tablet Take 2 tablets (40 mg total) by mouth 2 (two) times daily. 120 tablet 5   No current facility-administered medications for this visit.     Allergies:   Shrimp [shellfish allergy]   Social History:  The patient  reports that he has never smoked. He has never used smokeless tobacco. He reports that he does not drink alcohol or use drugs.   Family History:  [name] family history includes Cancer in his mother; Clotting disorder in his mother; Diabetes in his father, sister, and another family member; Heart attack in his father; Hypertension in his mother and another family member; Obesity in his sister, sister and another family member.   ROS:  Please see the history of present illness.   Otherwise, review of systems is positive for cough.   All other systems are reviewed and negative.   PHYSICAL EXAM: VS:  BP 120/70   Pulse 76   Ht 5\' 8"  (1.727 m)   Wt (!) 303 lb (137.4 kg)   BMI 46.07 kg/m  , BMI Body mass index is 46.07 kg/m. GEN: Well nourished, well developed, in no acute distress    HEENT: normal  Neck: no JVD, carotid bruits, or masses Cardiac: RRR; no murmurs, rubs, or gallops,no edema  Respiratory:  clear to auscultation bilaterally, normal work of breathing GI: soft, nontender, nondistended, + BS MS: no deformity or atrophy  Skin: warm and dry Neuro:  Strength and sensation are intact Psych: euthymic mood, full affect  EKG:  EKG is ordered today. Personal review of the ekg ordered shows SR, PVCs, anterior Q waves   Recent Labs: 03/20/2017: B Natriuretic Peptide 345.8 04/03/2017: Hemoglobin 15.3; Platelets 267 08/10/2017: ALT 15; TSH 1.876 09/16/2017: Magnesium 2.2 02/04/2018: BUN 15; Creatinine, Ser 1.25; Potassium 3.8; Sodium 139    Lipid Panel     Component Value Date/Time   CHOL 107 (L) 09/18/2015 1013   TRIG 68 09/18/2015 1013   HDL 40 09/18/2015 1013   CHOLHDL 2.7 09/18/2015 1013   VLDL 14 09/18/2015 1013   LDLCALC 53 09/18/2015 1013     Wt Readings from Last 3 Encounters:  02/12/18 (!) 303  lb (137.4 kg)  02/02/18 295 lb (133.8 kg)  01/25/18 (!) 301 lb 12.8 oz (136.9 kg)      Other studies Reviewed: Additional studies/ records that were reviewed today include: TTE 08/10/17  Review of the above records today demonstrates:  - Left ventricle: The cavity size was severely dilated. Wall   thickness was normal. Systolic function was severely reduced. The   estimated ejection fraction was in the range of 20% to 25%.   Diffuse hypokinesis. Doppler parameters are consistent with   restrictive physiology, indicative of decreased left ventricular   diastolic compliance and/or increased left atrial pressure. - Aortic valve: There was trivial regurgitation. - Left atrium: The atrium was severely dilated.    ASSESSMENT AND PLAN:  1.  Chronic systolic heart failure: Action fraction remains low.  He is on optimal medical therapy with carvedilol, Aldactone and Entresto.  Fortunately, his EF does remain low.  He thus would qualify for an ICD.  I  discussed the risks and benefits of ICD therapy.  Risks include bleeding, tamponade, infection, pneumothorax.  The patient understands these risks and has agreed to the procedure.  We Akshat Minehart wait to schedule until after his CT scan.  2.  Typical atrial flutter: Status post ablation 11/17/2016.  Had recurrent atrial flutter, though his EKG does look a little different and it could be a different flutter circuit.  Currently on amiodarone and Eliquis.  This patients CHA2DS2-VASc Score and unadjusted Ischemic Stroke Rate (% per year) is equal to 0.2 % stroke rate/year from a score of 0  Above score calculated as 1 point each if present [CHF, HTN, DM, Vascular=MI/PAD/Aortic Plaque, Age if 65-74, or Male] Above score calculated as 2 points each if present [Age > 75, or Stroke/TIA/TE]    3. Morbid obesity: Weight loss encouraged  4. DVT/PE: Currently on Eliquis.  Plan per primary cardiology and primary physician.     Current medicines are reviewed at length with the patient today.   The patient does not have concerns regarding his medicines.  The following changes were made today:  none  Labs/ tests ordered today include:  Orders Placed This Encounter  Procedures  . EKG 12-Lead   Case discussed with primary cardiology  Disposition:   FU with Jameisha Stofko 3  Signed, Braedan Meuth Jorja Loa, MD  02/12/2018 8:52 AM     [facility] [address] [place] [address] [phone] (office) [phone] (fax)

## 2018-02-18 ENCOUNTER — Ambulatory Visit (HOSPITAL_COMMUNITY): Payer: BC Managed Care – PPO

## 2018-02-19 NOTE — Telephone Encounter (Signed)
New message   Patient is returning call about a procedure on 02/25/2018.

## 2018-02-19 NOTE — Telephone Encounter (Signed)
Pt does not want to schedule ICD implant for 2/13, he wants to have CT scan on 2/12 and talk w/ Bensimhon first. Pt will call me next week to schedule a date for implant. He appreciates my follow up.

## 2018-02-24 ENCOUNTER — Ambulatory Visit (HOSPITAL_COMMUNITY): Payer: BC Managed Care – PPO

## 2018-03-03 ENCOUNTER — Ambulatory Visit (HOSPITAL_COMMUNITY): Payer: BC Managed Care – PPO

## 2018-03-04 ENCOUNTER — Emergency Department (HOSPITAL_BASED_OUTPATIENT_CLINIC_OR_DEPARTMENT_OTHER)
Admission: EM | Admit: 2018-03-04 | Discharge: 2018-03-04 | Disposition: A | Discharge status: Home / Self Care | Payer: BC Managed Care – PPO | Attending: Emergency Medicine | Admitting: Emergency Medicine

## 2018-03-04 ENCOUNTER — Encounter (HOSPITAL_BASED_OUTPATIENT_CLINIC_OR_DEPARTMENT_OTHER): Payer: Self-pay | Admitting: *Deleted

## 2018-03-04 ENCOUNTER — Emergency Department (HOSPITAL_BASED_OUTPATIENT_CLINIC_OR_DEPARTMENT_OTHER): Payer: BC Managed Care – PPO

## 2018-03-04 ENCOUNTER — Other Ambulatory Visit: Payer: Self-pay

## 2018-03-04 DIAGNOSIS — J111 Influenza due to unidentified influenza virus with other respiratory manifestations: Secondary | ICD-10-CM | POA: Diagnosis not present

## 2018-03-04 DIAGNOSIS — I5042 Chronic combined systolic (congestive) and diastolic (congestive) heart failure: Secondary | ICD-10-CM | POA: Diagnosis not present

## 2018-03-04 DIAGNOSIS — R69 Illness, unspecified: Secondary | ICD-10-CM

## 2018-03-04 DIAGNOSIS — Z7901 Long term (current) use of anticoagulants: Secondary | ICD-10-CM | POA: Insufficient documentation

## 2018-03-04 DIAGNOSIS — Z79899 Other long term (current) drug therapy: Secondary | ICD-10-CM | POA: Insufficient documentation

## 2018-03-04 DIAGNOSIS — I110 Hypertensive heart disease with heart failure: Secondary | ICD-10-CM | POA: Diagnosis not present

## 2018-03-04 DIAGNOSIS — R05 Cough: Secondary | ICD-10-CM | POA: Diagnosis present

## 2018-03-04 MED ORDER — ALBUTEROL SULFATE HFA 108 (90 BASE) MCG/ACT IN AERS
2.0000 | INHALATION_SPRAY | RESPIRATORY_TRACT | Status: DC | PRN
  Administered 2018-03-04: 2 via RESPIRATORY_TRACT
  Filled 2018-03-04: qty 6.7

## 2018-03-04 NOTE — ED Triage Notes (Signed)
Pt c/o URI symptoms x 3 days 

## 2018-03-04 NOTE — ED Provider Notes (Signed)
[facility] Provider Note: Lowella Dell, MD, FACEP  CSN: [id number] MRN: [id number] ARRIVAL: 03/04/18 at 0034 ROOM: MH02/MH02   CHIEF COMPLAINT  URI   HISTORY OF PRESENT ILLNESS  03/04/18 12:50 AM TANER MCCLENDON is a 53 y.o. male with a 4-day history of flulike symptoms.  Specifically he has had a nonproductive cough, fever, nasal congestion and body aches.  He is also had diarrhea but no nausea or vomiting.  Symptoms are moderate.  He has been taking DayQuil/NyQuil with partial relief but his cough has gotten concerned.  He has a history of CHF and pneumonia and wants to make sure that he is not progressing towards this.   Past Medical History:  Diagnosis Date  . Arthritis   . Chronic combined systolic and diastolic CHF (congestive heart failure) (HCC)   . Deep vein thrombosis (HCC)   . Lupus anticoagulant positive   . Morbid obesity (HCC)   . Mural thrombus of heart    coumadin  . Nonischemic cardiomyopathy (HCC)    a) 12/17/11 echo: LVEF 25-30%, grade 3 diastolic dysfunction (c/w restriction), mod MR, mod LA/LV and mild RA dilatation; b) 12/18/11 cMRI: LVEF 38%, mod LV/mild RV dilatation, global HK, mild-mod RV sys dysfxn, no LV thrombus & patchy non-subendocardial delayed enhancement c/w infil dz or prior myocarditis   . Paroxysmal SVT (supraventricular tachycardia) (HCC)   . 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 A-FLUTTER ABLATION N/A 11/17/2016   Procedure: A-FLUTTER ABLATION;  Surgeon: Regan Lemming, MD;  Location: [facility] INVASIVE CV LAB;  Service: Cardiovascular;  Laterality: N/A;  . CARDIAC CATHETERIZATION  06/2006   Angiographically normal cors  . CARDIAC CATHETERIZATION  12/22/2011   R/LHC: normal cors, well-compensated HDs, LV dysfxn  . CARDIOVERSION N/A 10/17/2016   Procedure: CARDIOVERSION;  Surgeon: Dolores Patty, MD;  Location: [facility] ENDOSCOPY;  Service:  Cardiovascular;  Laterality: N/A;  . CARDIOVERSION N/A 03/20/2017   Procedure: CARDIOVERSION;  Surgeon: Dolores Patty, MD;  Location: [facility] ENDOSCOPY;  Service: Cardiovascular;  Laterality: N/A;  . LEFT AND RIGHT HEART CATHETERIZATION WITH CORONARY ANGIOGRAM N/A 12/22/2011   Procedure: LEFT AND RIGHT HEART CATHETERIZATION WITH CORONARY ANGIOGRAM;  Surgeon: Dolores Patty, MD;  Location: [facility] CATH LAB;  Service: Cardiovascular;  Laterality: N/A;  . PATELLAR TENDON REPAIR     Left  . RIGHT HEART CATH N/A 10/20/2016   Procedure: RIGHT HEART CATH;  Surgeon: Dolores Patty, MD;  Location: [facility] INVASIVE CV LAB;  Service: Cardiovascular;  Laterality: N/A;  . TEE WITHOUT CARDIOVERSION N/A 03/20/2017   Procedure: TRANSESOPHAGEAL ECHOCARDIOGRAM (TEE);  Surgeon: Dolores Patty, MD;  Location: [facility] ENDOSCOPY;  Service: Cardiovascular;  Laterality: N/A;    Family History  Problem Relation Age of Onset  . Hypertension Mother   . Clotting disorder Mother        mom with PEs  . Cancer Mother        uterine cancer  . Diabetes Father   . Heart attack Father   . Hypertension Other        Sibling  . Diabetes Other   . Obesity Other   . Diabetes Sister   . Obesity Sister   . Obesity Sister   . Colon cancer Neg Hx   . Stomach cancer Neg Hx   . Esophageal cancer Neg Hx   . Rectal cancer Neg Hx   .  Liver cancer Neg Hx     Social History   Tobacco Use  . Smoking status: Never Smoker  . Smokeless tobacco: Never Used  Substance Use Topics  . Alcohol use: No    Comment: Rarely  . Drug use: No    Prior to Admission medications   Medication Sig Start Date End Date Taking? Authorizing Provider  acetaminophen (TYLENOL) 500 MG tablet Take 500-1,000 mg by mouth every 6 (six) hours as needed for moderate pain or headache.     [provider]  amiodarone (PACERONE) 200 MG tablet Take 1 tablet (200 mg total) by mouth daily. 08/10/17   Bensimhon, Bevelyn Buckles, MD  apixaban (ELIQUIS) 5 MG TABS  tablet Take 1 tablet (5 mg total) by mouth 2 (two) times daily. 03/17/17   Graciella Freer, PA-C  carvedilol (COREG) 3.125 MG tablet Take 1 tablet (3.125 mg total) by mouth 2 (two) times daily with a meal. 01/25/18   Bensimhon, Bevelyn Buckles, MD  coconut oil OIL Apply 1 application topically daily.    [provider]  DIGOX 125 MCG tablet TAKE 1 TABLET(0.125 MG) BY MOUTH DAILY 11/02/17   Bensimhon, Bevelyn Buckles, MD  potassium chloride SA (K-DUR,KLOR-CON) 20 MEQ tablet TAKE 2 TABLETS BY MOUTH TWICE DAILY 02/01/18   Bensimhon, Bevelyn Buckles, MD  sacubitril-valsartan (ENTRESTO) 49-51 MG Take 1 tablet by mouth 2 (two) times daily. 01/25/18   Bensimhon, Bevelyn Buckles, MD  sildenafil (VIAGRA) 100 MG tablet TAKE 1/2 TO 1 TABLET(50 TO 100 MG) BY MOUTH DAILY AS NEEDED FOR ERECTILE DYSFUNCTION 07/13/17   [name], MD  spironolactone (ALDACTONE) 25 MG tablet Take 0.5 tablets (12.5 mg total) by mouth daily. 11/13/16   Bensimhon, Bevelyn Buckles, MD  torsemide (DEMADEX) 20 MG tablet Take 2 tablets (40 mg total) by mouth 2 (two) times daily. 10/29/17   Bensimhon, Bevelyn Buckles, MD    Allergies Shrimp [shellfish allergy]   REVIEW OF SYSTEMS  Negative except as noted here or in the History of Present Illness.   PHYSICAL EXAMINATION  Initial Vital Signs Blood pressure (!) 100/57, pulse 84, temperature 98.9 F (37.2 C), resp. rate 16, height 5\' 8"  (1.727 m), weight (!) 137 kg, SpO2 98 %.  Examination General: Well-developed, well-nourished male in no acute distress; appearance consistent with age of record HENT: normocephalic; atraumatic; pharynx normal Eyes: pupils equal, round and reactive to light; extraocular muscles intact Neck: supple Heart: regular rate and rhythm Lungs: Mildly decreased air movement bilaterally Abdomen: soft; nondistended; nontender; nbowel sounds present Extremities: No deformity; full range of motion; pulses normal Neurologic: Awake, alert and oriented; motor function intact in all  extremities and symmetric; no facial droop Skin: Warm and dry Psychiatric: Normal mood and affect   RESULTS  Summary of this visit's results, reviewed by myself:   EKG Interpretation  Date/Time:    Ventricular Rate:    PR Interval:    QRS Duration:   QT Interval:    QTC Calculation:   R Axis:     Text Interpretation:        Laboratory Studies: No results found for this or any previous visit (from the past 24 hour(s)). Imaging Studies: Dg Chest 2 View  Result Date: 03/04/2018 CLINICAL DATA:  Cough and shortness of breath EXAM: CHEST - 2 VIEW COMPARISON:  03/24/2017 FINDINGS: Mild cardiomegaly. Pulmonary vascular congestion without overt edema. No focal consolidation. No pleural effusion or pneumothorax. IMPRESSION: Mild cardiomegaly with vascular congestion.  No overt edema. Electronically Signed  By: Deatra Robinson M.D.   On: 03/04/2018 01:35    ED COURSE and MDM  Nursing notes and initial vitals signs, including pulse oximetry, reviewed.  Vitals:   03/04/18 0040 03/04/18 0041 03/04/18 0108  BP: (!) 100/57    Pulse: 84    Resp: 16    Temp: 98.9 F (37.2 C)    SpO2: 98%  95%  Weight:  (!) 137 kg   Height:  5\' 8"  (1.727 m)    Patient given an inhaler and instructed in its use.  He is experienced improvement in his breathing.  He was advised to continue NyQuil/DayQuil as needed for symptoms.  PROCEDURES    ED DIAGNOSES     ICD-10-CM   1. Influenza-like illness R69        Josiel Gahm, Jonny Ruiz, MD 03/04/18 [phone]

## 2018-03-08 ENCOUNTER — Telehealth: Payer: Self-pay | Admitting: Cardiology

## 2018-03-08 DIAGNOSIS — I428 Other cardiomyopathies: Secondary | ICD-10-CM

## 2018-03-08 DIAGNOSIS — Z01812 Encounter for preprocedural laboratory examination: Secondary | ICD-10-CM

## 2018-03-08 DIAGNOSIS — I5022 Chronic systolic (congestive) heart failure: Secondary | ICD-10-CM

## 2018-03-08 NOTE — Telephone Encounter (Signed)
° ° °  Patient calling to schedule EP procedure

## 2018-03-09 NOTE — Telephone Encounter (Signed)
° °  Follow up    Patient calling to request 3/26 to schedule procedure

## 2018-03-12 ENCOUNTER — Other Ambulatory Visit (HOSPITAL_COMMUNITY): Payer: Self-pay | Admitting: Student

## 2018-03-12 NOTE — Telephone Encounter (Signed)
Pt aware I will follow up next week to go over procedure instructions.  He is agreeable to plan.

## 2018-03-15 ENCOUNTER — Encounter: Payer: Self-pay | Admitting: *Deleted

## 2018-03-15 NOTE — Telephone Encounter (Signed)
Reviewed ICD instructions w/ pt. Letter sent via MyChart. Pt will stop by the office next Monday for pre procedure labs.  He will pick up procedure scrub/instruction when he stops by for labs next week.  (left at front desk) He understands office will call him to arrange post procedure follow up. Patient verbalized understanding and agreeable to plan.

## 2018-03-22 ENCOUNTER — Other Ambulatory Visit: Payer: BC Managed Care – PPO | Admitting: *Deleted

## 2018-03-22 ENCOUNTER — Other Ambulatory Visit (HOSPITAL_COMMUNITY): Payer: Self-pay | Admitting: Student

## 2018-03-22 ENCOUNTER — Telehealth: Payer: Self-pay | Admitting: *Deleted

## 2018-03-22 ENCOUNTER — Encounter: Payer: Self-pay | Admitting: *Deleted

## 2018-03-22 DIAGNOSIS — I428 Other cardiomyopathies: Secondary | ICD-10-CM

## 2018-03-22 DIAGNOSIS — Z01812 Encounter for preprocedural laboratory examination: Secondary | ICD-10-CM

## 2018-03-22 DIAGNOSIS — I5022 Chronic systolic (congestive) heart failure: Secondary | ICD-10-CM

## 2018-03-22 MED ORDER — PREDNISONE 50 MG PO TABS: ORAL_TABLET | ORAL | 0 refills | Status: DC

## 2018-03-22 NOTE — Telephone Encounter (Signed)
Updated pt to new time to arrive for his ICD implant on 3/26. Pt aware to arrive at 9:30 am that morning. Updated letter of instructions sent via MyChart per pt request. Pt also aware that pre medication instructions will be listed on the new instruction paper. Pt agreeable to plan.

## 2018-03-23 LAB — BASIC METABOLIC PANEL
BUN/Creatinine Ratio: 10 (ref 9–20)
BUN: 11 mg/dL (ref 6–24)
CALCIUM: 9 mg/dL (ref 8.7–10.2)
CO2: 25 mmol/L (ref 20–29)
Chloride: 99 mmol/L (ref 96–106)
Creatinine, Ser: 1.13 mg/dL (ref 0.76–1.27)
GFR calc Af Amer: 86 mL/min/{1.73_m2} (ref >59)
GFR calc non Af Amer: 74 mL/min/{1.73_m2} (ref >59)
Glucose: 116 mg/dL — ABNORMAL HIGH (ref 65–99)
Potassium: 4.3 mmol/L (ref 3.5–5.2)
Sodium: 139 mmol/L (ref 134–144)

## 2018-03-24 ENCOUNTER — Other Ambulatory Visit: Payer: Self-pay | Admitting: Medical

## 2018-03-24 NOTE — Telephone Encounter (Signed)
Is this ok to refill?  

## 2018-03-24 NOTE — Telephone Encounter (Signed)
Needs yearly CPX appt

## 2018-03-25 NOTE — Telephone Encounter (Signed)
Forwarding to you since he refused his medicine

## 2018-03-26 NOTE — Telephone Encounter (Signed)
Patient does not want to schedule appointment at this time.

## 2018-03-31 NOTE — Telephone Encounter (Signed)
Updated pt that procedure being cancelled at this time d/t COVID-19.  Informed pt that Dr. Elberta Fortis reviewed chart to make this determination. Pt aware office will call to reschedule as soon as we are able to . Advised to call our office or heart failure clinic to discuss any issues r/t heart until such time as we can reschedule ICD implant. Pt agreeable to plan.

## 2018-04-05 ENCOUNTER — Telehealth (HOSPITAL_COMMUNITY): Payer: Self-pay

## 2018-04-05 NOTE — Telephone Encounter (Signed)
Prior authorization through [employer] was initiated for [employer] 24-26 medication and sent via cmm on 04/05/2018.

## 2018-04-08 ENCOUNTER — Ambulatory Visit (HOSPITAL_COMMUNITY): Admit: 2018-04-08 | Payer: BC Managed Care – PPO | Admitting: Cardiology

## 2018-04-08 ENCOUNTER — Encounter (HOSPITAL_COMMUNITY): Payer: Self-pay

## 2018-04-08 SURGERY — ICD IMPLANT

## 2018-04-19 ENCOUNTER — Ambulatory Visit: Payer: BC Managed Care – PPO

## 2018-04-30 ENCOUNTER — Other Ambulatory Visit (HOSPITAL_COMMUNITY): Payer: Self-pay | Admitting: Internal Medicine

## 2018-05-06 ENCOUNTER — Ambulatory Visit: Payer: BC Managed Care – PPO | Admitting: Medical

## 2018-05-06 ENCOUNTER — Encounter: Payer: Self-pay | Admitting: Medical

## 2018-05-06 ENCOUNTER — Other Ambulatory Visit: Payer: Self-pay

## 2018-05-06 VITALS — Ht 68.0 in | Wt 310.0 lb

## 2018-05-06 DIAGNOSIS — E790 Hyperuricemia without signs of inflammatory arthritis and tophaceous disease: Secondary | ICD-10-CM | POA: Insufficient documentation

## 2018-05-06 DIAGNOSIS — B360 Pityriasis versicolor: Secondary | ICD-10-CM

## 2018-05-06 DIAGNOSIS — I5022 Chronic systolic (congestive) heart failure: Secondary | ICD-10-CM | POA: Diagnosis not present

## 2018-05-06 DIAGNOSIS — R21 Rash and other nonspecific skin eruption: Secondary | ICD-10-CM | POA: Insufficient documentation

## 2018-05-06 DIAGNOSIS — R7301 Impaired fasting glucose: Secondary | ICD-10-CM

## 2018-05-06 DIAGNOSIS — I872 Venous insufficiency (chronic) (peripheral): Secondary | ICD-10-CM

## 2018-05-06 DIAGNOSIS — M109 Gout, unspecified: Secondary | ICD-10-CM | POA: Insufficient documentation

## 2018-05-06 MED ORDER — KETOCONAZOLE 2 % EX SHAM: 1.0000 "application " | MEDICATED_SHAMPOO | CUTANEOUS | 2 refills | Status: DC

## 2018-05-06 NOTE — Progress Notes (Signed)
Subjective:     Patient ID: Roberto [name], male   DOB: July 06, 1965, 53 y.o.   MRN: [id number]  This visit type was conducted due to national recommendations for restrictions regarding the COVID-19 Pandemic (e.g. social distancing) in an effort to limit this patient's exposure and mitigate transmission in our community.  Due to their co-morbid illnesses, this patient is at least at moderate risk for complications without adequate follow up.  This format is felt to be most appropriate for this patient at this time.    Documentation for virtual audio and video telecommunications through Zoom encounter:  The patient was located at home. The provider was located in the office. The patient did consent to this visit and is aware of possible charges through their insurance for this visit.  The other persons participating in this telemedicine service were none. Time spent on call was 15 minutes and in review of previous records > 25 minutes total.  This virtual service is not related to other E/M service within previous 7 days.   HPI Chief Complaint  Patient presents with  . rash    dry patches on arms and torso on and off    Virtual visit today for concern about rash.  He has had this before few years ago and I prescribed Nizoral shampoo which really helped then.  He has several spots scattered on both arms, upper arms, left upper chest, left breast, right breast, and they are a little rough and itchy.  He is using lotion and coconut oil but is not helping.  He would like a refill on the medicine I prescribed in the past.  When he last had it we discussed that being the "winter itch."   Otherwise he had knee surgery at the end of 2019, was up and walking well within 3 weeks, did well with rehab.  Had total knee replacement of the knee.  He has been doing well, working from home.  Exercising by walking in his apartment complex.  He has no COVID contact, no sick contacts otherwise, no travel.  He  is compliant with his heart medications/blood pressure medicines.  Elevated uric acid in the past-no current problems no concern for gout, no joint pain.  Impaired glucose-not checking glucose  Past Medical History:  Diagnosis Date  . Arthritis   . Chronic combined systolic and diastolic CHF (congestive heart failure) (HCC)   . Deep vein thrombosis (HCC)   . Lupus anticoagulant positive   . Morbid obesity (HCC)   . Mural thrombus of heart    coumadin  . Nonischemic cardiomyopathy (HCC)    a) 12/17/11 echo: LVEF 25-30%, grade 3 diastolic dysfunction (c/w restriction), mod MR, mod LA/LV and mild RA dilatation; b) 12/18/11 cMRI: LVEF 38%, mod LV/mild RV dilatation, global HK, mild-mod RV sys dysfxn, no LV thrombus & patchy non-subendocardial delayed enhancement c/w infil dz or prior myocarditis   . Paroxysmal SVT (supraventricular tachycardia) (HCC)   . Pulmonary embolism (HCC)    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     Review of Systems As in subjective    Objective:   Physical Exam  Ht 5\' 8"  (1.727 m)   Wt (!) 310 lb (140.6 kg)   BMI 47.14 kg/m   Due to coronavirus pandemic stay at home measures, patient visit was virtual and they were not examined in person.   Gen: Left upper lateral arm midshaft with 1.5 cm red/brown somewhat small non-distinct lesion, similar patch left upper  chest and left medial breast, similar patch right upper lateral arm. Well-developed, well-nourished no acute distress     Assessment:     Encounter Diagnoses  Name Primary?  . Rash Yes  . Tinea versicolor   . Chronic systolic heart failure (HCC)   . Venous insufficiency   . Impaired fasting blood sugar   . Elevated uric acid in blood        Plan:     Rash, likely tinea versicolor-we discussed the limitations of virtual visit and the rash was somewhat difficult to see but we discussed the similar skin findings he has had before (over a year ago) that improved with Nizoral shampoo so he will  begin back on this.  He can use this twice weekly leave on for 10 minutes then wash off.  If not seeing improvement within the next 1.5-week and call back.  CHF chronic-managed by cardiology, compliant with medications  Impaired glucose, elevated uric acid- we will plan for med check and labs may be in June if the coronavirus issues are improving or resolving  In the meantime advised precautions and trying to use stay at home measures and quarantine to avoid exposure to others in light of Covid19 given his risk factors as he is high risk  Roberto [name] was seen today for rash.  Diagnoses and all orders for this visit:  Rash  Tinea versicolor  Chronic systolic heart failure (HCC)  Venous insufficiency  Impaired fasting blood sugar  Elevated uric acid in blood  Other orders -     ketoconazole (NIZORAL) 2 % shampoo; Apply 1 application topically 2 (two) times a week.

## 2018-06-14 ENCOUNTER — Telehealth: Payer: Self-pay | Admitting: *Deleted

## 2018-06-14 NOTE — Telephone Encounter (Signed)
lmtcb to discuss re-scheduling ICD implant

## 2018-06-17 NOTE — Telephone Encounter (Signed)
lmtcb

## 2018-06-17 NOTE — Telephone Encounter (Signed)
follow up:   Patient call concering making a ICD implant appt please call patient.

## 2018-06-20 ENCOUNTER — Other Ambulatory Visit (HOSPITAL_COMMUNITY): Payer: Self-pay | Admitting: Internal Medicine

## 2018-06-25 NOTE — Telephone Encounter (Signed)
Pt returning this office's call please give him a call back.

## 2018-06-29 ENCOUNTER — Other Ambulatory Visit (HOSPITAL_COMMUNITY): Payer: Self-pay | Admitting: Internal Medicine

## 2018-07-01 NOTE — Telephone Encounter (Signed)
Pt scheduled for Tuesday, via telemedicine visit, to discuss rescheduling ablation. Pt agreeable to plan.     Virtual Visit Pre-Appointment Phone Call  "(Name), I am calling you today to discuss your upcoming appointment. We are currently trying to limit exposure to the virus that causes COVID-19 by seeing patients at home rather than in the office."  1. "What is the BEST phone number to call the day of the visit?" - include this in appointment notes  2. "Do you have or have access to (through a family member/friend) a smartphone with video capability that we can use for your visit?" a. If yes - list this number in appt notes as "cell" (if different from BEST phone #) and list the appointment type as a VIDEO visit in appointment notes b. If no - list the appointment type as a PHONE visit in appointment notes  3. Confirm consent - "In the setting of the current Covid19 crisis, you are scheduled for a (phone or video) visit with your provider on (date) at (time).  Just as we do with many in-office visits, in order for you to participate in this visit, we must obtain consent.  If you'd like, I can send this to your mychart (if signed up) or email for you to review.  Otherwise, I can obtain your verbal consent now.  All virtual visits are billed to your insurance company just like a normal visit would be.  By agreeing to a virtual visit, we'd like you to understand that the technology does not allow for your provider to perform an examination, and thus may limit your provider's ability to fully assess your condition. If your provider identifies any concerns that need to be evaluated in person, we will make arrangements to do so.  Finally, though the technology is pretty good, we cannot assure that it will always work on either your or our end, and in the setting of a video visit, we may have to convert it to a phone-only visit.  In either situation, we cannot ensure that we have a secure connection.  Are  you willing to proceed?" STAFF: Did the patient verbally acknowledge consent to telehealth visit? Document YES/NO here: YES  4. Advise patient to be prepared - "Two hours prior to your appointment, go ahead and check your blood pressure, pulse, oxygen saturation, and your weight (if you have the equipment to check those) and write them all down. When your visit starts, your provider will ask you for this information. If you have an Apple Watch or Kardia device, please plan to have heart rate information ready on the day of your appointment. Please have a pen and paper handy nearby the day of the visit as well."  5. Give patient instructions for MyChart download to smartphone OR Doximity/Doxy.me as below if video visit (depending on what platform provider is using)  6. Inform patient they will receive a phone call 15 minutes prior to their appointment time (may be from unknown caller ID) so they should be prepared to answer    TELEPHONE CALL NOTE  PERKINS MOLINA has been deemed a candidate for a follow-up tele-health visit to limit community exposure during the Covid-19 pandemic. I spoke with the patient via phone to ensure availability of phone/video source, confirm preferred email & phone number, and discuss instructions and expectations.  I reminded CARLSON BELLAND to be prepared with any vital sign and/or heart rhythm information that could potentially be obtained via home monitoring, at  the time of his visit. I reminded WENCES DELPOZO to expect a phone call prior to his visit.  Baird Lyons, RN 07/01/2018 3:55 PM   INSTRUCTIONS FOR DOWNLOADING THE MYCHART APP TO SMARTPHONE  - The patient must first make sure to have activated MyChart and know their login information - If Apple, go to [employer] and type in MyChart in the search bar and download the app. If Android, ask patient to go to [employer] and type in [place] in the search bar and download the app. The app is free but as with  any other app downloads, their phone may require them to verify saved payment information or Apple/Android password.  - The patient will need to then log into the app with their MyChart username and password, and select [place] as their healthcare provider to link the account. When it is time for your visit, go to the MyChart app, find appointments, and click Begin Video Visit. Be sure to Select Allow for your device to access the Microphone and Camera for your visit. You will then be connected, and your provider will be with you shortly.  **If they have any issues connecting, or need assistance please contact MyChart service desk [phone])**  **If using a computer, in order to ensure the best quality for their visit they will need to use either of the following Internet Browsers: [employer] or Google Chrome**  IF USING DOXIMITY or DOXY.ME - The patient will receive a link just prior to their visit by text.     FULL LENGTH CONSENT FOR TELE-HEALTH VISIT   I hereby voluntarily request, consent and authorize [facility] and its employed or contracted physicians, physician assistants, nurse practitioners or other licensed health care professionals (the Practitioner), to provide me with telemedicine health care services (the "Services") as deemed necessary by the treating Practitioner. I acknowledge and consent to receive the Services by the Practitioner via telemedicine. I understand that the telemedicine visit will involve communicating with the Practitioner through live audiovisual communication technology and the disclosure of certain medical information by electronic transmission. I acknowledge that I have been given the opportunity to request an in-person assessment or other available alternative prior to the telemedicine visit and am voluntarily participating in the telemedicine visit.  I understand that I have the right to withhold or withdraw my consent to the use of  telemedicine in the course of my care at any time, without affecting my right to future care or treatment, and that the Practitioner or I may terminate the telemedicine visit at any time. I understand that I have the right to inspect all information obtained and/or recorded in the course of the telemedicine visit and may receive copies of available information for a reasonable fee.  I understand that some of the potential risks of receiving the Services via telemedicine include:  [name] Delay or interruption in medical evaluation due to technological equipment failure or disruption; . Information transmitted may not be sufficient (e.g. poor resolution of images) to allow for appropriate medical decision making by the Practitioner; and/or  . In rare instances, security protocols could fail, causing a breach of personal health information.  Furthermore, I acknowledge that it is my responsibility to provide information about my medical history, conditions and care that is complete and accurate to the best of my ability. I acknowledge that Practitioner's advice, recommendations, and/or decision may be based on factors not within their control, such as incomplete or inaccurate  data provided by me or distortions of diagnostic images or specimens that may result from electronic transmissions. I understand that the practice of medicine is not an exact science and that Practitioner makes no warranties or guarantees regarding treatment outcomes. I acknowledge that I will receive a copy of this consent concurrently upon execution via email to the email address I last provided but may also request a printed copy by calling the office of [place].    I understand that my insurance will be billed for this visit.   I have read or had this consent read to me. . I understand the contents of this consent, which adequately explains the benefits and risks of the Services being provided via telemedicine.  . I have been  provided ample opportunity to ask questions regarding this consent and the Services and have had my questions answered to my satisfaction. . I give my informed consent for the services to be provided through the use of telemedicine in my medical care  By participating in this telemedicine visit I agree to the above.

## 2018-07-06 ENCOUNTER — Telehealth (INDEPENDENT_AMBULATORY_CARE_PROVIDER_SITE_OTHER): Payer: BC Managed Care – PPO | Admitting: Cardiology

## 2018-07-06 ENCOUNTER — Other Ambulatory Visit: Payer: Self-pay

## 2018-07-06 DIAGNOSIS — I428 Other cardiomyopathies: Secondary | ICD-10-CM

## 2018-07-06 DIAGNOSIS — I5022 Chronic systolic (congestive) heart failure: Secondary | ICD-10-CM

## 2018-07-06 NOTE — Progress Notes (Signed)
Electrophysiology TeleHealth Note   Due to national recommendations of social distancing due to COVID 19, an audio/video telehealth visit is felt to be most appropriate for this patient at this time.  See Epic message for the patient's consent to telehealth for [facility].   Date:  07/06/2018   ID:  Roberto [name], DOB 03/30/1965, MRN [id number]  Location: patient's home  Provider location: [address], [place]  Evaluation Performed: Follow-up visit  PCP:  Denita Lung, MD  Cardiologist:  Glori Bickers, MD  Electrophysiologist:  Dr Curt Bears  Chief Complaint: CHF  History of Present Illness:    Roberto [name] is a 53 y.o. male who presents via audio/video conferencing for a telehealth visit today.  Since last being seen in our clinic, the patient reports doing very well.  Today, he denies symptoms of palpitations, chest pain, shortness of breath,  lower extremity edema, dizziness, presyncope, or syncope.  The patient is otherwise without complaint today.  The patient denies symptoms of fevers, chills, cough, or new SOB worrisome for COVID 19.  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  He also has atrial fibrillation and a tachycardia induced cardiomyopathy.  He underwent cardioversion to sinus rhythm and was placed on amiodarone.  He is diuresed 12 pounds.  He is also had atrial flutter status post ablation in 2018.  Today, denies symptoms of palpitations, chest pain, shortness of breath, orthopnea, PND, lower extremity edema, claudication, dizziness, presyncope, syncope, bleeding, or neurologic sequela. The patient is tolerating medications without difficulties.  Overall he is feeling well.  He had knee surgery just prior to the stay at home order and has gained some weight.  He has now back to walking.  His knee is feeling quite a bit better.   Past Medical History:  Diagnosis Date  . Arthritis   . Chronic combined systolic and diastolic CHF (congestive heart failure) ([employer])   . Deep vein thrombosis ([place])   . Lupus anticoagulant positive   . Morbid obesity ([place])   . [place] thrombus of heart    coumadin  . Nonischemic cardiomyopathy ([place])    a) 12/17/11 echo: LVEF 52-84%, grade 3 diastolic dysfunction (c/w restriction), mod MR, mod LA/LV and mild RA dilatation; b) 12/18/11 cMRI: LVEF 38%, mod LV/mild RV dilatation, global HK, mild-mod RV sys dysfxn, no LV thrombus & patchy non-subendocardial delayed enhancement c/w infil dz or prior myocarditis   . Paroxysmal SVT (supraventricular tachycardia) ([place])   . Pulmonary embolism ([place])    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    Past Surgical History:  Procedure Laterality Date  . A-FLUTTER ABLATION N/A 11/17/2016   Procedure: A-FLUTTER ABLATION;  Surgeon: Constance Haw, MD;  Location: [place] CV LAB;  Service: Cardiovascular;  Laterality: N/A;  . CARDIAC CATHETERIZATION  06/2006   Angiographically normal cors  . CARDIAC CATHETERIZATION  12/22/2011   R/LHC: normal cors, well-compensated HDs, LV dysfxn  . CARDIOVERSION N/A 10/17/2016   Procedure: CARDIOVERSION;  Surgeon: Jolaine Artist, MD;  Location: [facility] ENDOSCOPY;  Service: Cardiovascular;  Laterality: N/A;  . CARDIOVERSION N/A 03/20/2017   Procedure: CARDIOVERSION;  Surgeon: Jolaine Artist, MD;  Location[facility] ENDOSCOPY;  Service: Cardiovascular;  Laterality: N/A;  . LEFT AND RIGHT HEART CATHETERIZATION WITH CORONARY ANGIOGRAM N/A 12/22/2011   Procedure: LEFT AND RIGHT HEART CATHETERIZATION WITH CORONARY ANGIOGRAM;  Surgeon: Shaune Pascal  Bensimhon, MD;  Location: [facility] CATH LAB;  Service: Cardiovascular;  Laterality: N/A;  . PATELLAR TENDON REPAIR     Left  . RIGHT HEART CATH N/A 10/20/2016   Procedure: RIGHT HEART CATH;  Surgeon: Dolores Patty, MD;  Location: [facility] INVASIVE CV LAB;  Service: Cardiovascular;  Laterality: N/A;   . TEE WITHOUT CARDIOVERSION N/A 03/20/2017   Procedure: TRANSESOPHAGEAL ECHOCARDIOGRAM (TEE);  Surgeon: Dolores Patty, MD;  Location: [facility] ENDOSCOPY;  Service: Cardiovascular;  Laterality: N/A;    Current Outpatient Medications  Medication Sig Dispense Refill  . amiodarone (PACERONE) 200 MG tablet TAKE 1 TABLET BY MOUTH DAILY 30 tablet 3  . carvedilol (COREG) 3.125 MG tablet Take 1 tablet (3.125 mg total) by mouth 2 (two) times daily with a meal. (Patient not taking: Reported on 05/06/2018) 60 tablet 6  . coconut oil OIL Apply 1 application topically daily.    . digoxin (LANOXIN) 0.125 MG tablet TAKE 1 TABLET(0.125 MG) BY MOUTH DAILY 90 tablet 1  . ELIQUIS 5 MG TABS tablet TAKE 1 TABLET(5 MG) BY MOUTH TWICE DAILY 60 tablet 11  . ketoconazole (NIZORAL) 2 % shampoo Apply 1 application topically 2 (two) times a week. 120 mL 2  . potassium chloride SA (K-DUR) 20 MEQ tablet TAKE 2 TABLETS BY MOUTH TWICE DAILY 120 tablet 3  . predniSONE (DELTASONE) 50 MG tablet Take 50 mg of Prednisone 13 hours, 7 hours and 1 hour prior to your defibrillator implant procedure. (Patient not taking: Reported on 05/06/2018) 3 tablet 0  . sacubitril-valsartan (ENTRESTO) 49-51 MG Take 1 tablet by mouth 2 (two) times daily. 60 tablet 6  . sildenafil (VIAGRA) 100 MG tablet TAKE 1/2 TO 1 TABLET(50 TO 100 MG) BY MOUTH DAILY AS NEEDED FOR ERECTILE DYSFUNCTION 10 tablet 5  . spironolactone (ALDACTONE) 25 MG tablet Take 0.5 tablets (12.5 mg total) by mouth daily. 45 tablet 3  . torsemide (DEMADEX) 20 MG tablet TAKE 2 TABLETS BY MOUTH TWICE DAILY. STOP LASIX 120 tablet 5   No current facility-administered medications for this visit.     Allergies:   Shrimp [shellfish allergy]   Social History:  The patient  reports that he has never smoked. He has never used smokeless tobacco. He reports that he does not drink alcohol or use drugs.   Family History:  [name]  family history includes Cancer in his mother; Clotting  disorder in his mother; Diabetes in his father, sister, and another family member; Heart attack in his father; Hypertension in his mother and another family member; Obesity in his sister, sister and another family member.   ROS:  Please see the history of present illness.   All other systems are personally reviewed and negative.    Exam:    Vital Signs:  There were no vitals taken for this visit.  Well appearing, alert and conversant, regular work of breathing,  good skin color Eyes- anicteric, neuro- grossly intact, skin- no apparent rash or lesions or cyanosis, mouth- oral mucosa is pink   Labs/Other Tests and Data Reviewed:    Recent Labs: 08/10/2017: ALT 15; TSH 1.876 09/16/2017: Magnesium 2.2 03/22/2018: BUN 11; Creatinine, Ser 1.13; Potassium 4.3; Sodium 139   Wt Readings from Last 3 Encounters:  05/06/18 (!) 310 lb (140.6 kg)  03/04/18 (!) 302 lb 0.5 oz (137 kg)  02/12/18 (!) 303 lb (137.4 kg)     Other studies personally reviewed: Additional studies/ records that were reviewed today include: ECG 04/13/2018 personally reviewed  Review of  the above records today demonstrates: Sinus rhythm, low voltage, anterior Q waves    ASSESSMENT & PLAN:    1.  Chronic systolic heart failure: Ejection fraction remains low.  He would thus qualify for an ICD.  Currently on carvedilol, Aldactone, Entresto.  Risks and benefits of ICD implant were discussed include bleeding, infection, tamponade, pneumothorax.  The patient understands the risks and is agreed to the procedure.  Prior to the procedure, as it is been almost a year since his last echo, he would prefer to have a repeat echo performed to see if his ejection fraction has improved.  2.  Typical atrial flutter: Status post ablation 11/17/2016.  Currently on amiodarone and Eliquis.  He has had multiple different atrial flutter morphologies.  .This patients CHA2DS2-VASc Score and unadjusted Ischemic Stroke Rate (% per year) is equal to 0.6 %  stroke rate/year from a score of 1  Above score calculated as 1 point each if present [CHF, HTN, DM, Vascular=MI/PAD/Aortic Plaque, Age if 65-74, or Male] Above score calculated as 2 points each if present [Age > 75, or Stroke/TIA/TE]  3.  Morbid obesity: Weight loss encouraged    COVID 19 screen The patient denies symptoms of COVID 19 at this time.  The importance of social distancing was discussed today.  Follow-up: 3 months   Current medicines are reviewed at length with the patient today.   The patient does not have concerns regarding his medicines.  The following changes were made today:  none  Labs/ tests ordered today include: Echocardiogram No orders of the defined types were placed in this encounter.    Patient Risk:  after full review of this patients clinical status, I feel that they are at moderate risk at this time.  Today, I have spent 8 minutes with the patient with telehealth technology discussing CHF, ICD.    Signed, Natelie Ostrosky Jorja LoaMartin Marvalene Barrett, MD  07/06/2018 1:35 PM     [facility] [address] [place] [address] [phone] (office) [phone] (fax)

## 2018-07-06 NOTE — Addendum Note (Signed)
Addended by: Stanton Kidney on: 07/06/2018 04:13 PM   Modules accepted: Orders

## 2018-07-12 ENCOUNTER — Emergency Department (HOSPITAL_BASED_OUTPATIENT_CLINIC_OR_DEPARTMENT_OTHER): Payer: BC Managed Care – PPO

## 2018-07-12 ENCOUNTER — Other Ambulatory Visit: Payer: Self-pay

## 2018-07-12 ENCOUNTER — Emergency Department (HOSPITAL_BASED_OUTPATIENT_CLINIC_OR_DEPARTMENT_OTHER)
Admission: EM | Admit: 2018-07-12 | Discharge: 2018-07-12 | Disposition: A | Discharge status: Home / Self Care | Payer: BC Managed Care – PPO | Attending: Emergency Medicine | Admitting: Emergency Medicine

## 2018-07-12 ENCOUNTER — Encounter (HOSPITAL_BASED_OUTPATIENT_CLINIC_OR_DEPARTMENT_OTHER): Payer: Self-pay | Admitting: *Deleted

## 2018-07-12 DIAGNOSIS — I5042 Chronic combined systolic (congestive) and diastolic (congestive) heart failure: Secondary | ICD-10-CM | POA: Diagnosis not present

## 2018-07-12 DIAGNOSIS — Z20828 Contact with and (suspected) exposure to other viral communicable diseases: Secondary | ICD-10-CM | POA: Diagnosis not present

## 2018-07-12 DIAGNOSIS — R509 Fever, unspecified: Secondary | ICD-10-CM | POA: Insufficient documentation

## 2018-07-12 DIAGNOSIS — R5381 Other malaise: Secondary | ICD-10-CM | POA: Diagnosis not present

## 2018-07-12 DIAGNOSIS — R05 Cough: Secondary | ICD-10-CM | POA: Insufficient documentation

## 2018-07-12 DIAGNOSIS — Z79899 Other long term (current) drug therapy: Secondary | ICD-10-CM | POA: Insufficient documentation

## 2018-07-12 DIAGNOSIS — Z20822 Contact with and (suspected) exposure to covid-19: Secondary | ICD-10-CM

## 2018-07-12 LAB — SARS CORONAVIRUS 2 AG (30 MIN TAT): SARS Coronavirus 2 Ag: NEGATIVE

## 2018-07-12 MED ORDER — SODIUM CHLORIDE 0.9% FLUSH
3.0000 mL | Freq: Once | INTRAVENOUS | Status: DC
  Filled 2018-07-12: qty 3

## 2018-07-12 MED ORDER — ACETAMINOPHEN 325 MG PO TABS
650.0000 mg | ORAL_TABLET | Freq: Once | ORAL | Status: AC
  Administered 2018-07-12: 20:00:00 650 mg via ORAL
  Filled 2018-07-12: qty 2

## 2018-07-12 NOTE — Discharge Instructions (Addendum)
It is important that you quarantine yourself and your corona virus send out test back which would most likely be Wednesday.  Also your daughter needs to remain home until you get your testing back so that she is not taking it to work.

## 2018-07-12 NOTE — ED Triage Notes (Signed)
Chills last night. States he has been to [place] recently. Unknown Covid exposure.

## 2018-07-12 NOTE — ED Provider Notes (Signed)
[facility] EMERGENCY DEPARTMENT Provider Note   CSN: [id number] Arrival date & time: 07/12/18  [id number]     History   Chief Complaint Chief Complaint  Patient presents with   Chills    HPI Roberto [name] is a 53 y.o. male.     Patient is a 53 year old male with a history of CHF, DVT, PE, cardiomyopathy with mural thrombus on anticoagulation who is presenting today with COVID-like symptoms.  Patient developed fever, one episode of diarrhea, mild cough and general malaise yesterday.  Symptoms have persisted today.  Patient has recently returned from [place].  Patient flew down to [place] and was there from June 14-21.  He is visiting family while he was there but his fiance called him yesterday and had someone at her work who tested positive.  He is currently working from home here but lives with his 53 year old daughter.  He denies any shortness of breath, new leg swelling and states he still taking all of his medications as prescribed.  The history is provided by the patient.  URI Presenting symptoms: cough and fever   Presenting symptoms comment:  One episode of diarrhea, mild nausea Severity:  Moderate Onset quality:  Gradual Duration: Symptoms started yesterday. Timing:  Constant Progression:  Waxing and waning Chronicity:  New Relieved by:  OTC medications Worsened by:  Nothing Ineffective treatments:  None tried Associated symptoms: headaches and myalgias   Risk factors: chronic cardiac disease and recent travel   Risk factors: no immunosuppression     Past Medical History:  Diagnosis Date   Arthritis    Chronic combined systolic and diastolic CHF (congestive heart failure) (HCC)    Deep vein thrombosis (HCC)    Lupus anticoagulant positive    Morbid obesity (HCC)    Mural thrombus of heart    coumadin   Nonischemic cardiomyopathy (HCC)    a) 12/17/11 echo: LVEF 25-30%, grade 3 diastolic dysfunction (c/w restriction), mod MR, mod LA/LV and mild RA  dilatation; b) 12/18/11 cMRI: LVEF 38%, mod LV/mild RV dilatation, global HK, mild-mod RV sys dysfxn, no LV thrombus & patchy non-subendocardial delayed enhancement c/w infil dz or prior myocarditis    Paroxysmal SVT (supraventricular tachycardia) (HCC)    Pulmonary embolism (HCC)    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    Patient Active Problem List   Diagnosis Date Noted   Rash 05/06/2018   Elevated uric acid in blood 05/06/2018   Atypical atrial flutter (HCC) 03/17/2017   Chronic pain of right knee 03/13/2017   Typical atrial flutter (HCC)    Varicose veins of legs 12/17/2015   Encounter for health maintenance examination in adult 12/17/2015   Special screening for malignant neoplasms, colon 12/17/2015   Vaccine counseling 12/17/2015   Screening for prostate cancer 12/17/2015   Knee deformity, acquired, right 12/17/2015   Gait disturbance 12/17/2015   Osteoarthritis of right knee 12/17/2015   Varicose veins of lower extremities with ulcer (HCC) 09/18/2015   Need for prophylactic vaccination and inoculation against influenza 09/18/2015   Tinea versicolor 06/15/2015   Impaired fasting blood sugar 12/28/2014   Long term current use of anticoagulant therapy 12/28/2014   Erectile dysfunction 02/19/2012   Chronic systolic heart failure (HCC) 12/29/2011   Hypokalemia 12/24/2011   Lupus anticoagulant positive 12/22/2011   Nonischemic cardiomyopathy (HCC) 12/16/2011   Paroxysmal SVT (supraventricular tachycardia) (HCC) 12/16/2011   History of venous thromboembolism 12/16/2011   Venous insufficiency 11/04/2011   Obesity, morbid (HCC) 06/02/2008  Past Surgical History:  Procedure Laterality Date   A-FLUTTER ABLATION N/A 11/17/2016   Procedure: A-FLUTTER ABLATION;  Surgeon: Regan Lemmingamnitz, Will Martin, MD;  [facility]: [facility] INVASIVE CV LAB;  Service: Cardiovascular;  Laterality: N/A;   CARDIAC CATHETERIZATION  06/2006   Angiographically normal cors   CARDIAC  CATHETERIZATION  12/22/2011   R/LHC: normal cors, well-compensated HDs, LV dysfxn   CARDIOVERSION N/A 10/17/2016   Procedure: CARDIOVERSION;  Surgeon: Dolores PattyBensimhon, Daniel R, MD;  [facility];  Service: Cardiovascular;  Laterality: N/A;   CARDIOVERSION N/A 03/20/2017   Procedure: CARDIOVERSION;  Surgeon: Dolores PattyBensimhon, Daniel R, MD;  [facility];  Service: Cardiovascular;  Laterality: N/A;   LEFT AND RIGHT HEART CATHETERIZATION WITH CORONARY ANGIOGRAM N/A 12/22/2011   Procedure: LEFT AND RIGHT HEART CATHETERIZATION WITH CORONARY ANGIOGRAM;  Surgeon: Dolores Pattyaniel R Bensimhon, MD;  [facility];  Service: Cardiovascular;  Laterality: N/A;   PATELLAR TENDON REPAIR     Left   RIGHT HEART CATH N/A 10/20/2016   Procedure: RIGHT HEART CATH;  Surgeon: Dolores PattyBensimhon, Daniel R, MD;  [facility]: [facility] IN[facility];  Service: Cardiovascular;  Laterality: N/A;   TEE WITHOUT CARDIOVERSION N/A 03/20/2017   Procedure: TRANSESOPHAGEAL ECHOCARDIOGRAM (TEE);  Surgeon: Dolores PattyBensimhon, Daniel R, MD;  [facility];  Service: Cardiovascular;  Laterality: N/A;        Home Medications    Prior to Admission medications   Medication Sig Start Date End Date Taking? Authorizing Provider  amiodarone (PACERONE) 200 MG tablet TAKE 1 TABLET BY MOUTH DAILY 06/21/18  Yes Bensimhon, Bevelyn Bucklesaniel R, MD  digoxin (LANOXIN) 0.125 MG tablet TAKE 1 TABLET(0.125 MG) BY MOUTH DAILY 04/30/18  Yes Bensimhon, Bevelyn Bucklesaniel R, MD  ELIQUIS 5 MG TABS tablet TAKE 1 TABLET(5 MG) BY MOUTH TWICE DAILY 03/22/18  Yes Bensimhon, Bevelyn Bucklesaniel R, MD  ketoconazole (NIZORAL) 2 % shampoo Apply 1 application topically 2 (two) times a week. 05/06/18  Yes Tysinger, Kermit Baloavid S, PA-C  potassium chloride SA (K-DUR) 20 MEQ tablet TAKE 2 TABLETS BY MOUTH TWICE DAILY 06/29/18  Yes Bensimhon, Bevelyn Bucklesaniel R, MD  sacubitril-valsartan (ENTRESTO) 49-51 MG Take 1 tablet by mouth 2 (two) times daily. 01/25/18  Yes Bensimhon, Bevelyn Bucklesaniel R, MD  sildenafil (VIAGRA) 100 MG tablet TAKE 1/2 TO  1 TABLET(50 TO 100 MG) BY MOUTH DAILY AS NEEDED FOR ERECTILE DYSFUNCTION 07/13/17  Yes Ronnald NianLalonde, [name], MD  spironolactone (ALDACTONE) 25 MG tablet Take 0.5 tablets (12.5 mg total) by mouth daily. 11/13/16  Yes Bensimhon, Bevelyn Bucklesaniel R, MD  torsemide (DEMADEX) 20 MG tablet TAKE 2 TABLETS BY MOUTH TWICE DAILY. STOP LASIX 06/29/18  Yes Bensimhon, Bevelyn Bucklesaniel R, MD  carvedilol (COREG) 3.125 MG tablet Take 1 tablet (3.125 mg total) by mouth 2 (two) times daily with a meal. 01/25/18   Bensimhon, Bevelyn Bucklesaniel R, MD  coconut oil OIL Apply 1 application topically daily.    [provider]  predniSONE (DELTASONE) 50 MG tablet Take 50 mg of Prednisone 13 hours, 7 hours and 1 hour prior to your defibrillator implant procedure. 03/22/18   Camnitz, Andree CossWill Martin, MD    Family History Family History  Problem Relation Age of Onset   Hypertension Mother    Clotting disorder Mother        mom with PEs   Cancer Mother        uterine cancer   Diabetes Father    Heart attack Father    Hypertension Other        Sibling   Diabetes Other    Obesity  Other    Diabetes Sister    Obesity Sister    Obesity Sister    Colon cancer Neg Hx    Stomach cancer Neg Hx    Esophageal cancer Neg Hx    Rectal cancer Neg Hx    Liver cancer Neg Hx     Social History Social History   Tobacco Use   Smoking status: Never Smoker   Smokeless tobacco: Never Used  Substance Use Topics   Alcohol use: No    Comment: Rarely   Drug use: No     Allergies   Shrimp [shellfish allergy]   Review of Systems Review of Systems  Constitutional: Positive for fever.  Respiratory: Positive for cough.   Musculoskeletal: Positive for myalgias.  Neurological: Positive for headaches.  All other systems reviewed and are negative.    Physical Exam Updated Vital Signs BP (!) 101/55    Pulse 99    Temp (!) 101.4 F (38.6 C) (Oral)    Resp 20    Ht 5\' 8"  (1.727 m)    Wt (!) 155.1 kg    SpO2 97%    BMI 52.00 kg/m    Physical Exam Vitals signs and nursing note reviewed.  Constitutional:      General: He is not in acute distress.    Appearance: He is well-developed. He is obese.  HENT:     Head: Normocephalic and atraumatic.  Eyes:     Conjunctiva/sclera: Conjunctivae normal.     Pupils: Pupils are equal, round, and reactive to light.  Neck:     Musculoskeletal: Normal range of motion and neck supple.  Cardiovascular:     Rate and Rhythm: Normal rate and regular rhythm.     Heart sounds: No murmur.  Pulmonary:     Effort: Pulmonary effort is normal. No respiratory distress.     Breath sounds: Normal breath sounds. No wheezing or rales.  Abdominal:     General: There is no distension.     Palpations: Abdomen is soft.     Tenderness: There is no abdominal tenderness. There is no guarding or rebound.  Musculoskeletal: Normal range of motion.        General: No tenderness.     Comments: Skin changes consistent with chronic venous stasis in the left lower extremity but no notable edema  Skin:    General: Skin is warm and dry.     Findings: No erythema or rash.  Neurological:     General: No focal deficit present.     Mental Status: He is alert and oriented to person, place, and time. Mental status is at baseline.  Psychiatric:        Mood and Affect: Mood normal.        Behavior: Behavior normal.        Thought Content: Thought content normal.      ED Treatments / Results  Labs (all labs ordered are listed, but only abnormal results are displayed) Labs Reviewed  SARS CORONAVIRUS 2 (HOSP ORDER, PERFORMED IN [place] [facility] VIA ABBOTT ID)  NOVEL CORONAVIRUS, NAA (HOSPITAL ORDER, SEND-OUT TO REF LAB)    EKG    Radiology No results found.  Procedures Procedures (including critical care time)  Medications Ordered in ED Medications  sodium chloride flush (NS) 0.9 % injection 3 mL (has no administration in time range)  acetaminophen (TYLENOL) tablet 650 mg (has no administration  in time range)     Initial Impression / Assessment and Plan /  ED Course  I have reviewed the triage vital signs and the nursing notes.  Pertinent labs & imaging results that were available during my care of the patient were reviewed by me and considered in my medical decision making (see chart for details).        53 year old male with multiple medical problems including lupus anticoagulant, hypercoagulable state, cardiomyopathy on anticoagulation and CHF who presents today with COVID-like symptoms.  Patient has multiple risk factors including recently traveling on an airplane and being in [place].  He is hemodynamically stable at this time and complains of a mild flulike illness.  He has no shortness of breath and oxygen saturation is 97% on room air.  Patient will be swab for COVID and then recommended that he go home for quarantine.  Roberto [name] was evaluated in Emergency Department on 07/12/2018 for the symptoms described in the history of present illness. He was evaluated in the context of the global COVID-19 pandemic, which necessitated consideration that the patient might be at risk for infection with the SARS-CoV-2 virus that causes COVID-19. Institutional protocols and algorithms that pertain to the evaluation of patients at risk for COVID-19 are in a state of rapid change based on information released by regulatory bodies including the CDC and federal and state organizations. These policies and algorithms were followed during the patient's care in the ED.   8:22 PM Rapid test here was neg and pt had send out corona sent.  Final Clinical Impressions(s) / ED Diagnoses   Final diagnoses:  Suspected Covid-19 Virus Infection    ED Discharge Orders    None       Blanchie Dessert, MD 07/12/18 2025

## 2018-07-14 LAB — NOVEL CORONAVIRUS, NAA (HOSP ORDER, SEND-OUT TO REF LAB; TAT 18-24 HRS): SARS-CoV-2, NAA: NOT DETECTED

## 2018-07-17 ENCOUNTER — Other Ambulatory Visit: Payer: Self-pay | Admitting: Medical

## 2018-07-17 DIAGNOSIS — I5022 Chronic systolic (congestive) heart failure: Secondary | ICD-10-CM

## 2018-07-19 NOTE — Telephone Encounter (Signed)
Is this refill appropriate?  

## 2018-07-20 ENCOUNTER — Ambulatory Visit (HOSPITAL_COMMUNITY): Payer: BC Managed Care – PPO | Attending: Cardiology

## 2018-07-20 ENCOUNTER — Other Ambulatory Visit: Payer: Self-pay

## 2018-07-20 DIAGNOSIS — I5022 Chronic systolic (congestive) heart failure: Secondary | ICD-10-CM | POA: Diagnosis not present

## 2018-07-20 DIAGNOSIS — I428 Other cardiomyopathies: Secondary | ICD-10-CM | POA: Diagnosis not present

## 2018-07-20 MED ORDER — PERFLUTREN LIPID MICROSPHERE
1.0000 mL | INTRAVENOUS | Status: AC | PRN
  Administered 2018-07-20: 2 mL via INTRAVENOUS

## 2018-07-26 ENCOUNTER — Telehealth (HOSPITAL_COMMUNITY): Payer: Self-pay

## 2018-07-26 NOTE — Telephone Encounter (Signed)
Pt called asking for a letter stating he has heart condition that puts him at higher risk for covid for employer. Will send via mychart per pt request.

## 2018-08-14 DIAGNOSIS — Z95810 Presence of automatic (implantable) cardiac defibrillator: Secondary | ICD-10-CM

## 2018-08-14 HISTORY — DX: Presence of automatic (implantable) cardiac defibrillator: Z95.810

## 2018-08-19 ENCOUNTER — Telehealth: Payer: Self-pay | Admitting: *Deleted

## 2018-08-19 DIAGNOSIS — Z01812 Encounter for preprocedural laboratory examination: Secondary | ICD-10-CM

## 2018-08-19 DIAGNOSIS — I428 Other cardiomyopathies: Secondary | ICD-10-CM

## 2018-08-19 NOTE — Telephone Encounter (Signed)
Called pt to arrange ICD implant. Scheduled for 8/21. Pt will stop by the [facility] office next week for pre procedure labs. COVID screening scheduled for 8/18. Post procedure f/u scheduled. Instructions sent via mychart and pt aware. Patient verbalized understanding and agreeable to plan.

## 2018-08-24 ENCOUNTER — Encounter: Payer: Self-pay | Admitting: *Deleted

## 2018-08-27 LAB — BASIC METABOLIC PANEL
BUN/Creatinine Ratio: 10 (ref 9–20)
BUN: 14 mg/dL (ref 6–24)
CO2: 26 mmol/L (ref 20–29)
Calcium: 9.2 mg/dL (ref 8.7–10.2)
Chloride: 100 mmol/L (ref 96–106)
Creatinine, Ser: 1.44 mg/dL — ABNORMAL HIGH (ref 0.76–1.27)
GFR calc Af Amer: 64 mL/min/{1.73_m2} (ref >59)
GFR calc non Af Amer: 55 mL/min/{1.73_m2} — ABNORMAL LOW (ref >59)
Glucose: 91 mg/dL (ref 65–99)
Potassium: 4.6 mmol/L (ref 3.5–5.2)
Sodium: 140 mmol/L (ref 134–144)

## 2018-08-27 LAB — CBC
Hematocrit: 45.9 % (ref 37.5–51.0)
Hemoglobin: 15 g/dL (ref 13.0–17.7)
MCH: 29 pg (ref 26.6–33.0)
MCHC: 32.7 g/dL (ref 31.5–35.7)
MCV: 89 fL (ref 79–97)
Platelets: 227 10*3/uL (ref 150–450)
RBC: 5.17 x10E6/uL (ref 4.14–5.80)
RDW: 12.5 % (ref 11.6–15.4)
WBC: 10.2 10*3/uL (ref 3.4–10.8)

## 2018-08-27 MED ORDER — PREDNISONE 50 MG PO TABS: ORAL_TABLET | ORAL | 0 refills | Status: DC

## 2018-08-27 NOTE — Telephone Encounter (Signed)
Spoke to pt about premedication prior to procedure d/t shellfish allergy. Sent instructions via mychart also

## 2018-08-31 ENCOUNTER — Other Ambulatory Visit (HOSPITAL_COMMUNITY)
Admission: RE | Admit: 2018-08-31 | Discharge: 2018-08-31 | Disposition: A | Discharge status: Home / Self Care | Payer: BC Managed Care – PPO | Source: Ambulatory Visit | Attending: Cardiology | Admitting: Cardiology

## 2018-08-31 DIAGNOSIS — Z20828 Contact with and (suspected) exposure to other viral communicable diseases: Secondary | ICD-10-CM | POA: Diagnosis not present

## 2018-08-31 DIAGNOSIS — Z01812 Encounter for preprocedural laboratory examination: Secondary | ICD-10-CM | POA: Diagnosis not present

## 2018-08-31 LAB — SARS CORONAVIRUS 2 (TAT 6-24 HRS): SARS Coronavirus 2: NEGATIVE

## 2018-09-02 MED ORDER — DEXTROSE 5 % IV SOLN
3.0000 g | INTRAVENOUS | Status: AC
  Administered 2018-09-03: 3 g via INTRAVENOUS
  Filled 2018-09-02 (×2): qty 3000

## 2018-09-02 MED ORDER — SODIUM CHLORIDE 0.9 % IV SOLN
80.0000 mg | INTRAVENOUS | Status: AC
  Administered 2018-09-03: 12:00:00 80 mg

## 2018-09-03 ENCOUNTER — Encounter (HOSPITAL_COMMUNITY)
Admission: RE | Disposition: A | Discharge status: Home / Self Care | Payer: Self-pay | Source: Home / Self Care | Attending: Cardiology

## 2018-09-03 ENCOUNTER — Encounter (HOSPITAL_COMMUNITY): Payer: Self-pay | Admitting: Cardiology

## 2018-09-03 ENCOUNTER — Other Ambulatory Visit: Payer: Self-pay

## 2018-09-03 ENCOUNTER — Ambulatory Visit (HOSPITAL_COMMUNITY)
Admission: RE | Admit: 2018-09-03 | Discharge: 2018-09-04 | Disposition: A | Discharge status: Home / Self Care | Payer: BC Managed Care – PPO | Attending: Cardiology | Admitting: Cardiology

## 2018-09-03 DIAGNOSIS — Z6841 Body Mass Index (BMI) 40.0 and over, adult: Secondary | ICD-10-CM | POA: Diagnosis not present

## 2018-09-03 DIAGNOSIS — I483 Typical atrial flutter: Secondary | ICD-10-CM | POA: Insufficient documentation

## 2018-09-03 DIAGNOSIS — I428 Other cardiomyopathies: Secondary | ICD-10-CM

## 2018-09-03 DIAGNOSIS — Z79899 Other long term (current) drug therapy: Secondary | ICD-10-CM | POA: Diagnosis not present

## 2018-09-03 DIAGNOSIS — I5022 Chronic systolic (congestive) heart failure: Secondary | ICD-10-CM | POA: Diagnosis not present

## 2018-09-03 DIAGNOSIS — I240 Acute coronary thrombosis not resulting in myocardial infarction: Secondary | ICD-10-CM

## 2018-09-03 DIAGNOSIS — Z006 Encounter for examination for normal comparison and control in clinical research program: Secondary | ICD-10-CM | POA: Insufficient documentation

## 2018-09-03 DIAGNOSIS — Z7901 Long term (current) use of anticoagulants: Secondary | ICD-10-CM | POA: Diagnosis not present

## 2018-09-03 DIAGNOSIS — I255 Ischemic cardiomyopathy: Secondary | ICD-10-CM | POA: Diagnosis not present

## 2018-09-03 DIAGNOSIS — I480 Paroxysmal atrial fibrillation: Secondary | ICD-10-CM | POA: Insufficient documentation

## 2018-09-03 DIAGNOSIS — R760 Raised antibody titer: Secondary | ICD-10-CM | POA: Diagnosis present

## 2018-09-03 DIAGNOSIS — Z95810 Presence of automatic (implantable) cardiac defibrillator: Secondary | ICD-10-CM | POA: Insufficient documentation

## 2018-09-03 DIAGNOSIS — Z86718 Personal history of other venous thrombosis and embolism: Secondary | ICD-10-CM | POA: Diagnosis not present

## 2018-09-03 DIAGNOSIS — Z95818 Presence of other cardiac implants and grafts: Secondary | ICD-10-CM

## 2018-09-03 HISTORY — PX: ICD IMPLANT: EP1208

## 2018-09-03 LAB — SURGICAL PCR SCREEN
MRSA, PCR: NEGATIVE
Staphylococcus aureus: NEGATIVE

## 2018-09-03 SURGERY — ICD IMPLANT
Anesthesia: LOCAL

## 2018-09-03 MED ORDER — LIDOCAINE HCL (PF) 1 % IJ SOLN
INTRAMUSCULAR | Status: DC | PRN
  Administered 2018-09-03: 60 mL

## 2018-09-03 MED ORDER — MUPIROCIN 2 % EX OINT
TOPICAL_OINTMENT | CUTANEOUS | Status: AC
  Administered 2018-09-03: 07:00:00
  Filled 2018-09-03: qty 22

## 2018-09-03 MED ORDER — CEFAZOLIN SODIUM-DEXTROSE 1-4 GM/50ML-% IV SOLN
1.0000 g | Freq: Four times a day (QID) | INTRAVENOUS | Status: AC
  Administered 2018-09-03 – 2018-09-04 (×3): 1 g via INTRAVENOUS
  Filled 2018-09-03 (×3): qty 50

## 2018-09-03 MED ORDER — SACUBITRIL-VALSARTAN 49-51 MG PO TABS
1.0000 | ORAL_TABLET | Freq: Two times a day (BID) | ORAL | Status: DC
  Administered 2018-09-03 – 2018-09-04 (×3): 1 via ORAL
  Filled 2018-09-03 (×4): qty 1

## 2018-09-03 MED ORDER — ACETAMINOPHEN 325 MG PO TABS
325.0000 mg | ORAL_TABLET | ORAL | Status: DC | PRN
  Administered 2018-09-03: 650 mg via ORAL
  Filled 2018-09-03: qty 2

## 2018-09-03 MED ORDER — LIDOCAINE HCL 1 % IJ SOLN
INTRAMUSCULAR | Status: AC
  Filled 2018-09-03: qty 20

## 2018-09-03 MED ORDER — HEPARIN (PORCINE) IN NACL 1000-0.9 UT/500ML-% IV SOLN
INTRAVENOUS | Status: DC | PRN
  Administered 2018-09-03: 500 mL

## 2018-09-03 MED ORDER — SODIUM CHLORIDE 0.9 % IV SOLN
INTRAVENOUS | Status: AC
  Filled 2018-09-03: qty 2

## 2018-09-03 MED ORDER — POTASSIUM CHLORIDE CRYS ER 20 MEQ PO TBCR
40.0000 meq | EXTENDED_RELEASE_TABLET | Freq: Two times a day (BID) | ORAL | Status: DC
  Administered 2018-09-03 – 2018-09-04 (×3): 40 meq via ORAL
  Filled 2018-09-03 (×3): qty 2

## 2018-09-03 MED ORDER — MIDAZOLAM HCL 5 MG/5ML IJ SOLN
INTRAMUSCULAR | Status: AC
  Filled 2018-09-03: qty 5

## 2018-09-03 MED ORDER — YOU HAVE A PACEMAKER BOOK
Freq: Once | Status: AC
  Administered 2018-09-04: 1
  Filled 2018-09-03: qty 1

## 2018-09-03 MED ORDER — APIXABAN 5 MG PO TABS
5.0000 mg | ORAL_TABLET | Freq: Two times a day (BID) | ORAL | Status: DC
  Administered 2018-09-03 – 2018-09-04 (×3): 5 mg via ORAL
  Filled 2018-09-03 (×3): qty 1

## 2018-09-03 MED ORDER — ONDANSETRON HCL 4 MG/2ML IJ SOLN: 4.0000 mg | Freq: Four times a day (QID) | INTRAMUSCULAR | Status: DC | PRN

## 2018-09-03 MED ORDER — DIPHENHYDRAMINE HCL 50 MG/ML IJ SOLN
25.0000 mg | Freq: Once | INTRAMUSCULAR | Status: AC
  Administered 2018-09-03: 25 mg via INTRAVENOUS
  Filled 2018-09-03: qty 1

## 2018-09-03 MED ORDER — LIDOCAINE HCL 1 % IJ SOLN
INTRAMUSCULAR | Status: AC
  Filled 2018-09-03: qty 60

## 2018-09-03 MED ORDER — FENTANYL CITRATE (PF) 100 MCG/2ML IJ SOLN
INTRAMUSCULAR | Status: DC | PRN
  Administered 2018-09-03 (×2): 25 ug via INTRAVENOUS

## 2018-09-03 MED ORDER — CARVEDILOL 3.125 MG PO TABS
3.1250 mg | ORAL_TABLET | Freq: Two times a day (BID) | ORAL | Status: DC
  Filled 2018-09-03 (×2): qty 1

## 2018-09-03 MED ORDER — FENTANYL CITRATE (PF) 100 MCG/2ML IJ SOLN
INTRAMUSCULAR | Status: AC
  Filled 2018-09-03: qty 2

## 2018-09-03 MED ORDER — COCONUT OIL OIL: 1.0000 "application " | TOPICAL_OIL | Status: DC | PRN

## 2018-09-03 MED ORDER — DIGOXIN 125 MCG PO TABS
0.1250 mg | ORAL_TABLET | Freq: Every day | ORAL | Status: DC
  Administered 2018-09-03 – 2018-09-04 (×2): 0.125 mg via ORAL
  Filled 2018-09-03 (×2): qty 1

## 2018-09-03 MED ORDER — AMIODARONE HCL 200 MG PO TABS
200.0000 mg | ORAL_TABLET | Freq: Every day | ORAL | Status: DC
  Administered 2018-09-03 – 2018-09-04 (×2): 200 mg via ORAL
  Filled 2018-09-03 (×2): qty 1

## 2018-09-03 MED ORDER — TORSEMIDE 20 MG PO TABS
40.0000 mg | ORAL_TABLET | Freq: Two times a day (BID) | ORAL | Status: DC
  Administered 2018-09-03 – 2018-09-04 (×2): 40 mg via ORAL
  Filled 2018-09-03 (×2): qty 2

## 2018-09-03 MED ORDER — IOHEXOL 350 MG/ML SOLN
INTRAVENOUS | Status: DC | PRN
  Administered 2018-09-03: 11:00:00 15 mL via INTRAVENOUS

## 2018-09-03 MED ORDER — CHLORHEXIDINE GLUCONATE 4 % EX LIQD
60.0000 mL | Freq: Once | CUTANEOUS | Status: DC
  Filled 2018-09-03: qty 60

## 2018-09-03 MED ORDER — SODIUM CHLORIDE 0.9 % IV SOLN
INTRAVENOUS | Status: DC
  Administered 2018-09-03: 08:00:00 via INTRAVENOUS

## 2018-09-03 MED ORDER — HEPARIN (PORCINE) IN NACL 1000-0.9 UT/500ML-% IV SOLN
INTRAVENOUS | Status: AC
  Filled 2018-09-03: qty 500

## 2018-09-03 MED ORDER — SPIRONOLACTONE 12.5 MG HALF TABLET
12.5000 mg | ORAL_TABLET | Freq: Every day | ORAL | Status: DC
  Administered 2018-09-03 – 2018-09-04 (×2): 12.5 mg via ORAL
  Filled 2018-09-03 (×2): qty 1

## 2018-09-03 MED ORDER — MIDAZOLAM HCL 5 MG/5ML IJ SOLN
INTRAMUSCULAR | Status: DC | PRN
  Administered 2018-09-03 (×2): 1 mg via INTRAVENOUS

## 2018-09-03 SURGICAL SUPPLY — 8 items
CABLE SURGICAL S-101-97-12 (CABLE) ×2 IMPLANT
HOVERMATT SINGLE USE (MISCELLANEOUS) ×1 IMPLANT
ICD VISIA MRI VR DVFB1D4 (ICD Generator) IMPLANT
LEAD SPRINT QUAT SEC 6935M-62 (Lead) ×1 IMPLANT
PAD PRO RADIOLUCENT 2001M-C (PAD) ×2 IMPLANT
SHEATH 9FR PRELUDE SNAP 13 (SHEATH) ×1 IMPLANT
TRAY PACEMAKER INSERTION (PACKS) ×2 IMPLANT
VISIA MRI VR DVFB1D4 (ICD Generator) ×2 IMPLANT

## 2018-09-03 NOTE — H&P (Signed)
Electrophysiology TeleHealth Note   Due to national recommendations of social distancing due to COVID 19, an audio/video telehealth visit is felt to be most appropriate for this patient at this time.  See Epic message for the patient's consent to telehealth for [facility].   Date:  09/03/2018   ID:  Roberto [name], DOB 09-22-1965, MRN [id number]  Location: patient's home  Provider location: [address], [place]  Evaluation Performed: Follow-up visit  PCP:  Ronnald NianLalonde, [name], MD  Cardiologist:  Arvilla Meresaniel Bensimhon, MD  Electrophysiologist:  Dr Elberta Fortisamnitz  Chief Complaint: CHF  History of Present Illness:    Roberto [name] is a 53 y.o. male who presents via audio/video conferencing for a telehealth visit today.  Since last being seen in our clinic, the patient reports doing very well.  Today, he denies symptoms of palpitations, chest pain, shortness of breath,  lower extremity edema, dizziness, presyncope, or syncope.  The patient is otherwise without complaint today.  The patient denies symptoms of fevers, chills, cough, or new SOB worrisome for COVID 19.  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  He also has atrial fibrillation and a tachycardia induced cardiomyopathy.  He underwent cardioversion to sinus rhythm and was placed on amiodarone.  He is diuresed 12 pounds.  He is also had atrial flutter status post ablation in 2018.  Today, denies symptoms of palpitations, chest pain, shortness of breath, orthopnea, PND, lower extremity edema, claudication, dizziness, presyncope, syncope, bleeding, or neurologic sequela. The patient is tolerating medications without difficulties.  Overall he is feeling well.  He had knee surgery just prior to the stay at home order and has gained some weight.  He has now back to walking.  His knee is feeling quite a bit better.   Past Medical History:  Diagnosis Date  . Arthritis   . Chronic combined systolic and diastolic CHF (congestive heart failure) (HCC)   . Deep vein thrombosis (HCC)   . Lupus anticoagulant positive   . Morbid obesity (HCC)   . Mural thrombus of heart    coumadin  . Nonischemic cardiomyopathy (HCC)    a) 12/17/11 echo: LVEF 25-30%, grade 3 diastolic dysfunction (c/w restriction), mod MR, mod LA/LV and mild RA dilatation; b) 12/18/11 cMRI: LVEF 38%, mod LV/mild RV dilatation, global HK, mild-mod RV sys dysfxn, no LV thrombus & patchy non-subendocardial delayed enhancement c/w infil dz or prior myocarditis   . Paroxysmal SVT (supraventricular tachycardia) (HCC)   . 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 A-FLUTTER ABLATION N/A 11/17/2016   Procedure: A-FLUTTER ABLATION;  Surgeon: Regan Lemmingamnitz, Kaedin Hicklin Martin, MD;  [facility]: [facility] LAB;  Service: Cardiovascular;  Laterality: N/A;  . CARDIAC CATHETERIZATION  06/2006   Angiographically normal cors  . CARDIAC CATHETERIZATION  12/22/2011   R/LHC: normal cors, well-compensated HDs, LV dysfxn  . CARDIOVERSION N/A 10/17/2016   Procedure: CARDIOVERSION;  Surgeon: Dolores PattyBensimhon, Daniel R, MD;  [facility];  Service: Cardiovascular;  Laterality: N/A;  . CARDIOVERSION N/A 03/20/2017   Procedure: CARDIOVERSION;  Surgeon: Dolores PattyBensimhon, Daniel R, MD;  [facility];  Service: Cardiovascular;  Laterality: N/A;  . LEFT AND RIGHT HEART CATHETERIZATION WITH CORONARY ANGIOGRAM N/A 12/22/2011   Procedure: LEFT AND RIGHT HEART CATHETERIZATION WITH CORONARY ANGIOGRAM;  Surgeon: Bevelyn Bucklesaniel R  Bensimhon, MD;  Location: [facility] CATH LAB;  Service: Cardiovascular;  Laterality: N/A;  . PATELLAR TENDON REPAIR     Left  . RIGHT HEART CATH N/A 10/20/2016   Procedure: RIGHT HEART CATH;  Surgeon: Dolores Patty, MD;  [facility] INVASIVE CV LAB;  Service: Cardiovascular;  Laterality: N/A;   . TEE WITHOUT CARDIOVERSION N/A 03/20/2017   Procedure: TRANSESOPHAGEAL ECHOCARDIOGRAM (TEE);  Surgeon: Dolores Patty, MD;  Location: [facility] ENDOSCOPY;  Service: Cardiovascular;  Laterality: N/A;    Current Facility-Administered Medications  Medication Dose Route Frequency Provider Last Rate Last Dose  . 0.9 %  sodium chloride infusion   Intravenous Continuous Regan Lemming, MD 50 mL/hr at 09/03/18 0735    . ceFAZolin (ANCEF) 3 g in dextrose 5 % 50 mL IVPB  3 g Intravenous To SS-Surg Marynell Bies Daphine Deutscher, MD      . chlorhexidine (HIBICLENS) 4 % liquid 4 application  60 mL Topical Once Bettey Muraoka Daphine Deutscher, MD      . chlorhexidine (HIBICLENS) 4 % liquid 4 application  60 mL Topical Once Kestrel Mis Daphine Deutscher, MD      . gentamicin (GARAMYCIN) 80 mg in sodium chloride 0.9 % 500 mL irrigation  80 mg Irrigation To Cath Jupiter Kabir, Andree Coss, MD        Allergies:   Shrimp [shellfish allergy]   Social History:  The patient  reports that he has never smoked. He has never used smokeless tobacco. He reports that he does not drink alcohol or use drugs.   Family History:  [name]  family history includes Cancer in his mother; Clotting disorder in his mother; Diabetes in his father, sister, and another family member; Heart attack in his father; Hypertension in his mother and another family member; Obesity in his sister, sister and another family member.   ROS:  Please see the history of present illness.   All other systems are personally reviewed and negative.    Exam:    Vital Signs:  BP 116/67   Pulse 83   Temp (!) 97.2 F (36.2 C) (Skin)   Resp 18   Ht 5\' 8"  (1.727 m)   Wt (!) 149.7 kg   SpO2 95%   BMI 50.18 kg/m   Well appearing, alert and conversant, regular work of breathing,  good skin color Eyes- anicteric, neuro- grossly intact, skin- no apparent rash or lesions or cyanosis, mouth- oral mucosa is pink   Labs/Other Tests and Data Reviewed:    Recent Labs: 09/16/2017:  Magnesium 2.2 08/26/2018: BUN 14; Creatinine, Ser 1.44; Hemoglobin 15.0; Platelets 227; Potassium 4.6; Sodium 140   Wt Readings from Last 3 Encounters:  09/03/18 (!) 149.7 kg  07/12/18 (!) 155.1 kg  05/06/18 (!) 140.6 kg     Other studies personally reviewed: Additional studies/ records that were reviewed today include: ECG 04/13/2018 personally reviewed  Review of the above records today demonstrates: Sinus rhythm, low voltage, anterior Q waves    ASSESSMENT & PLAN:    1.  Chronic systolic heart failure: Ejection fraction remains low.  He would thus qualify for an ICD.  Currently on carvedilol, Aldactone, Entresto.  Risks and benefits of ICD implant were discussed include bleeding, infection, tamponade, pneumothorax.  The patient understands the risks and is agreed to the procedure.  Prior to the procedure, as it is been almost a year since his last echo, he would prefer to have a repeat echo performed to see if his ejection fraction has improved.  2.  Typical atrial flutter: Status post ablation 11/17/2016.  Currently on amiodarone and Eliquis.  He has had multiple different atrial flutter morphologies.  .This patients CHA2DS2-VASc Score and unadjusted Ischemic Stroke Rate (% per year) is equal to 0.6 % stroke rate/year from a score of 1  Above score calculated as 1 point each if present [CHF, HTN, DM, Vascular=MI/PAD/Aortic Plaque, Age if 65-74, or Male] Above score calculated as 2 points each if present [Age > 75, or Stroke/TIA/TE]  3.  Morbid obesity: Weight loss encouraged  Signed, Maccoy Haubner Meredith Leeds, MD  09/03/2018 9:20 AM     [facility] [address] [place] [address] [phone] (office) [phone] (fax)  ICD Criteria  Current LVEF:25-30%. Within 12 months prior to implant: Yes   Heart failure history: Yes, Class II  Cardiomyopathy history: Yes, Non-Ischemic Cardiomyopathy.  Atrial Fibrillation/Atrial Flutter: No.   Ventricular tachycardia history: No.  Cardiac arrest history: No.  History of syndromes with risk of sudden death: No.  Previous ICD: No.  Current ICD indication: Primary  PPM indication: No.  Class I or II Bradycardia indication present: No  Beta Blocker therapy for 3 or more months: Yes, prescribed.   Ace Inhibitor/ARB therapy for 3 or more months: Yes, prescribed.    I have seen Roberto [name] is a 53 y.o. malepre-procedural and has been referred by Bensimhon for consideration of ICD implant for primary prevention of sudden death.  [name] chart has been reviewed and they meet criteria for ICD implant.  I have had a thorough discussion with the patient reviewing options.  The patient and their family (if available) have had opportunities to ask questions and have them answered. The patient and I have decided together through the [place] [employer] to implant ICD at this time.  Risks, benefits, alternatives to ICD implantation were discussed in detail with the patient today. The patient  understands that the risks include but are not limited to bleeding, infection, pneumothorax, perforation, tamponade, vascular damage, renal failure, MI, stroke, death, inappropriate shocks, and lead dislodgement and  wishes to proceed.

## 2018-09-03 NOTE — Discharge Instructions (Addendum)
Supplemental Discharge Instructions for  Pacemaker/Defibrillator Patients  Activity No heavy lifting or vigorous activity with your left/right arm for 6 to 8 weeks.  Do not raise your left/right arm above your head for one week.  Gradually raise your affected arm as drawn below.              09/07/2018                 09/08/2018               09/09/2018              09/10/2018 __  NO DRIVING until cleared to at your wound check visit .  WOUND CARE - Keep the wound area clean and dry.  Do not get this area wet, no showersuntil cleared to at your wound chek visit - The tape/steri-strips on your wound will fall off; do not pull them off.  No bandage is needed on the site.  DO  NOT apply any creams, oils, or ointments to the wound area. - If you notice any drainage or discharge from the wound, any swelling or bruising at the site, or you develop a fever > 101? F after you are discharged home, call the office at once.  Special Instructions - You are still able to use cellular telephones; use the ear opposite the side where you have your pacemaker/defibrillator.  Avoid carrying your cellular phone near your device. - When traveling through airports, show security personnel your identification card to avoid being screened in the metal detectors.  Ask the security personnel to use the hand wand. - Avoid arc welding equipment, MRI testing (magnetic resonance imaging), TENS units (transcutaneous nerve stimulators).  Call the office for questions about other devices. - Avoid electrical appliances that are in poor condition or are not properly grounded. - Microwave ovens are safe to be near or to operate.  Additional information for defibrillator patients should your device go off: - If your device goes off ONCE and you feel fine afterward, notify the device clinic nurses. - If your device goes off ONCE and you do not feel well afterward, call 911. - If your device goes off TWICE, call 911. - If  your device goes off THREE times in one day, call 911.  DO NOT DRIVE YOURSELF OR [name] WITH A DEFIBRILLATOR TO THE HOSPITAL--CALL 911.   Information on my medicine - ELIQUIS (apixaban)  This medication education was reviewed with me or my healthcare representative as part of my discharge preparation.    Why was Eliquis prescribed for you? Eliquis was prescribed for you to reduce the risk of a blood clot forming that can cause a stroke if you have a medical condition called atrial fibrillation (a type of irregular heartbeat).  What do You need to know about Eliquis ? Take your Eliquis TWICE DAILY - one tablet in the morning and one tablet in the evening with or without food. If you have difficulty swallowing the tablet whole please discuss with your pharmacist how to take the medication safely.  Take Eliquis exactly as prescribed by your doctor and DO NOT stop taking Eliquis without talking to the doctor who prescribed the medication.  Stopping may increase your risk of developing a stroke.  Refill your prescription before you run out.  After discharge, you should have regular check-up appointments with your healthcare provider that is prescribing your Eliquis.  In the future your dose may need  to be changed if your kidney function or weight changes by a significant amount or as you get older.  What do you do if you miss a dose? If you miss a dose, take it as soon as you remember on the same day and resume taking twice daily.  Do not take more than one dose of ELIQUIS at the same time to make up a missed dose.  Important Safety Information A possible side effect of Eliquis is bleeding. You should call your healthcare provider right away if you experience any of the following: ? Bleeding from an injury or your nose that does not stop. ? Unusual colored urine (red or dark brown) or unusual colored stools (red or black). ? Unusual bruising for unknown reasons. ? A serious  fall or if you hit your head (even if there is no bleeding).  Some medicines may interact with Eliquis and might increase your risk of bleeding or clotting while on Eliquis. To help avoid this, consult your healthcare provider or pharmacist prior to using any new prescription or non-prescription medications, including herbals, vitamins, non-steroidal anti-inflammatory drugs (NSAIDs) and supplements.  This website has more information on Eliquis (apixaban): http://www.eliquis.com/eliquis/home

## 2018-09-03 NOTE — Progress Notes (Signed)
Patient given dose of prednisone for procedure

## 2018-09-04 ENCOUNTER — Ambulatory Visit (HOSPITAL_COMMUNITY): Payer: BC Managed Care – PPO

## 2018-09-04 ENCOUNTER — Encounter (HOSPITAL_COMMUNITY): Payer: Self-pay | Admitting: Nurse Practitioner

## 2018-09-04 DIAGNOSIS — I240 Acute coronary thrombosis not resulting in myocardial infarction: Secondary | ICD-10-CM

## 2018-09-04 DIAGNOSIS — I480 Paroxysmal atrial fibrillation: Secondary | ICD-10-CM

## 2018-09-04 DIAGNOSIS — I428 Other cardiomyopathies: Secondary | ICD-10-CM

## 2018-09-04 DIAGNOSIS — Z86718 Personal history of other venous thrombosis and embolism: Secondary | ICD-10-CM

## 2018-09-04 DIAGNOSIS — Z006 Encounter for examination for normal comparison and control in clinical research program: Secondary | ICD-10-CM | POA: Diagnosis not present

## 2018-09-04 DIAGNOSIS — I255 Ischemic cardiomyopathy: Secondary | ICD-10-CM | POA: Diagnosis not present

## 2018-09-04 DIAGNOSIS — I5022 Chronic systolic (congestive) heart failure: Secondary | ICD-10-CM | POA: Diagnosis not present

## 2018-09-04 MED ORDER — SPIRONOLACTONE 25 MG PO TABS: 12.5000 mg | ORAL_TABLET | Freq: Every day | ORAL | Status: DC

## 2018-09-04 NOTE — Discharge Summary (Addendum)
Discharge Summary    Patient ID: Roberto [name],  MRN: [id number], DOB/AGE: [birth year] 53 y.o.  Admit date: 09/03/2018 Discharge date: 09/04/2018  Primary Care Provider: Ronnald NianLalonde, John [name] Primary Cardiologist: Roberto Meresaniel Bensimhon, MD  Primary Electrophysiologist: Roberto MainsW. Diontae Route, MD   Discharge Diagnoses    Principal Problem:   Nonischemic cardiomyopathy Roberto [name](HCC)  *S/p single lead AICD placement this admission.  Active Problems:   Chronic systolic heart failure (HCC)   Typical atrial flutter s/p catheter ablation   PAF (paroxysmal atrial fibrillation) (HCC)   Obesity, morbid (HCC)   History of venous thromboembolism   Lupus anticoagulant positive   History of LV (left ventricular) mural thrombus   History of DVT (deep vein thrombosis)   Allergies Allergies  Allergen Reactions   Shrimp [Shellfish Allergy] Hives and Itching    Diagnostic Studies/Procedures    AICD Insertion 8.21.2020  Successful placement of a Medtronic Visia AF MRI SureScan (serial Number [id number]) ICD  _____________   History of Present Illness     53 year old male with a history of nonischemic cardiomyopathy (EF 25 to 30%), chronic systolic congestive heart failure, atrial flutter status post catheter ablation, paroxysmal atrial fibrillation, DVT and PE, history of LV thrombus, PSVT, and obesity.  Despite optimization of medical therapy and management of arrhythmias, he has had persistent LV dysfunction with most recent echo continue to show an EF of 25 to 30%.  In that setting, he was evaluated by electrophysiology with decision to pursue placement of an AICD.  Hospital Course     Consultants: None  Patient presented to the [name] electrophysiology laboratory on September 03, 2018 and underwent successful placement of a Medtronic Visia AF MRI SureScan single-lead AICD.  Patient tolerated the procedure well and postprocedure has been ambulating without recurrent symptoms or limitations.  Chest x-ray this  morning is without pneumothorax.  [name] be discharged home today in good condition with plans for device clinic follow-up on September 3. _____________  Physical Exam   Discharge Vitals Blood pressure (!) 92/56, pulse 73, temperature 98.6 F (37 [name]), temperature source Oral, resp. rate 15, height 5\' 8"  (1.727 m), weight (!) 149.1 kg, SpO2 95 %.  Filed Weights   09/03/18 0547 09/04/18 0309  Weight: (!) 149.7 kg (!) 149.1 kg    GEN: Well nourished, well developed, in no acute distress.  HEENT: Grossly normal.  Neck: Supple, no JVD, carotid bruits, or masses. Cardiac: RRR, no murmurs, rubs, or gallops. No clubbing, cyanosis, edema.  Radials/DP/PT 2+ and equal bilaterally.  Left upper chest wall AICD insertion site without bleeding or hematoma. Respiratory:  Respirations regular and unlabored, clear to auscultation bilaterally. GI: Soft, nontender, nondistended, BS + x 4. MS: no deformity or atrophy. Skin: warm and dry, no rash. Neuro:  Strength and sensation are intact. Psych: AAOx3.  Normal affect.  Labs & Radiologic Studies    ____________  Dg Chest 2 View  Result Date: 09/04/2018 CLINICAL DATA:  Status post cardiac device placement. EXAM: CHEST - 2 VIEW COMPARISON:  Chest radiograph 03/04/2018 FINDINGS: Interval insertion single lead AICD device. Cardiomegaly. Pulmonary vascular redistribution. No large area of pulmonary consolidation. No pleural effusion or pneumothorax. Lateral view limited due to overlapping soft tissues. IMPRESSION: Interval insertion single lead AICD device. No definite left-sided pneumothorax. Electronically Signed   By: Roberto Beltrew  [name] M.D.   On: 09/04/2018 07:54   Disposition   Pt is being discharged home today in good condition.  Follow-up Plans & Appointments   Follow-up  Information    [facility] Follow up.   Specialty: Cardiology Why: 09/16/2018 @ 9:00AM, wound check visit Contact information: [address], [address] [place] [address] [phone]       Regan Lemming, MD Follow up.   Specialty: Cardiology Why: 12/03/2018 @ 2:15PM Contact information: [address] [place] [address] [phone]          Discharge Instructions    Call MD for:  difficulty breathing, headache or visual disturbances   Complete by: As directed    Call MD for:  persistant dizziness or light-headedness   Complete by: As directed    Call MD for:  redness, tenderness, or signs of infection (pain, swelling, redness, odor or green/yellow discharge around incision site)   Complete by: As directed    Call MD for:  severe uncontrolled pain   Complete by: As directed    Call MD for:  temperature >100.4   Complete by: As directed    Diet - low sodium heart healthy   Complete by: As directed    Increase activity slowly   Complete by: As directed       Discharge Medications   Allergies as of 09/04/2018      Reactions   Shrimp [shellfish Allergy] Hives, Itching      Medication List    STOP taking these medications   ketoconazole 2 % shampoo Commonly known as: Nizoral   predniSONE 50 MG tablet Commonly known as: DELTASONE     TAKE these medications   acetaminophen 650 MG CR tablet Commonly known as: TYLENOL Take 1,300 mg by mouth every 8 (eight) hours as needed for pain.   amiodarone 200 MG tablet Commonly known as: PACERONE TAKE 1 TABLET BY MOUTH DAILY   carvedilol 3.125 MG tablet Commonly known as: Coreg Take 1 tablet (3.125 mg total) by mouth 2 (two) times daily with a meal.   coconut oil Oil Apply 1 application topically as needed (dry skin).   digoxin 0.125 MG tablet Commonly known as: LANOXIN TAKE 1 TABLET(0.125 MG) BY MOUTH DAILY What changed: See the new instructions.   Eliquis 5 MG Tabs tablet Generic drug: apixaban TAKE 1 TABLET(5 MG) BY MOUTH TWICE DAILY What changed: See the new instructions.   potassium chloride SA 20 MEQ  tablet Commonly known as: K-DUR TAKE 2 TABLETS BY MOUTH TWICE DAILY   sacubitril-valsartan 49-51 MG Commonly known as: Entresto Take 1 tablet by mouth 2 (two) times daily.   sildenafil 100 MG tablet Commonly known as: VIAGRA TAKE 1/2 TO 1 TABLET(50 TO 100 MG) BY MOUTH DAILY AS NEEDED FOR ERECTILE DYSFUNCTION What changed: See the new instructions.   spironolactone 25 MG tablet Commonly known as: ALDACTONE Take 0.5 tablets (12.5 mg total) by mouth daily. What changed: See the new instructions.   torsemide 20 MG tablet Commonly known as: DEMADEX TAKE 2 TABLETS BY MOUTH TWICE DAILY. STOP LASIX What changed: See the new instructions.        Outstanding Labs/Studies   None  Duration of Discharge Encounter   Greater than 30 minutes including physician time.  Signed, Roberto Ducking NP 09/04/2018, 9:03 AM     I have seen and examined this patient with Roberto [name].  Agree with above, note added to reflect my findings.  On exam, RRR, no murmurs, lungs clear.  He is now status post Medtronic ICD for primary prevention.  Device functioning appropriately.  Chest x-ray and  interrogation without issue.  Plan for discharge today with follow-up in device clinic.  Roberto Selley M. Earon Rivest MD 09/04/2018 9:07 AM

## 2018-09-04 NOTE — Plan of Care (Addendum)
  Problem: Education: Goal: Knowledge of cardiac device and self-care will improve Outcome: Completed/Met Goal: Ability to safely manage health related needs after discharge will improve Outcome: Completed/Met Goal: Individualized Educational Video(s) Outcome: Completed/Met   Problem: Cardiac: Goal: Ability to achieve and maintain adequate cardiopulmonary perfusion will improve Outcome: Completed/Met   Problem: Education: Goal: Knowledge of General Education information will improve Description: Including pain rating scale, medication(s)/side effects and non-pharmacologic comfort measures Outcome: Completed/Met   Problem: Health Behavior/Discharge Planning: Goal: Ability to manage health-related needs will improve Outcome: Completed/Met   Problem: Clinical Measurements: Goal: Ability to maintain clinical measurements within normal limits will improve Outcome: Completed/Met Goal: Will remain free from infection Outcome: Completed/Met Goal: Diagnostic test results will improve Outcome: Completed/Met Goal: Respiratory complications will improve Outcome: Completed/Met Goal: Cardiovascular complication will be avoided Outcome: Completed/Met   Problem: Activity: Goal: Risk for activity intolerance will decrease Outcome: Completed/Met   Problem: Nutrition: Goal: Adequate nutrition will be maintained Outcome: Completed/Met   Problem: Coping: Goal: Level of anxiety will decrease Outcome: Completed/Met   Problem: Elimination: Goal: Will not experience complications related to bowel motility Outcome: Completed/Met Goal: Will not experience complications related to urinary retention Outcome: Completed/Met   Problem: Pain Managment: Goal: General experience of comfort will improve Outcome: Completed/Met   Problem: Safety: Goal: Ability to remain free from injury will improve Outcome: Completed/Met   Problem: Skin Integrity: Goal: Risk for impaired skin integrity will  decrease Outcome: Completed/Met  Discharge instructions reviewed with patient.  These included, but were not limited to, the following:  ICD placement aftercare,  Incision care, medication list review, when to call the MD, instructions specific to ICD, dietary recommendations, activity recommendations, etc.  Patient discharged to private home via private vehicle driven by mother.  Escorted to exit via wheelchair by nurse tech.

## 2018-09-06 MED FILL — Lidocaine HCl Local Inj 1%: INTRAMUSCULAR | Qty: 20 | Status: AC

## 2018-09-06 MED FILL — Lidocaine HCl Local Inj 1%: INTRAMUSCULAR | Qty: 60 | Status: AC

## 2018-09-06 MED FILL — Gentamicin Sulfate Inj 40 MG/ML: INTRAMUSCULAR | Qty: 80 | Status: AC

## 2018-09-15 ENCOUNTER — Telehealth: Payer: Self-pay

## 2018-09-15 ENCOUNTER — Other Ambulatory Visit (HOSPITAL_COMMUNITY): Payer: Self-pay | Admitting: Internal Medicine

## 2018-09-15 NOTE — Telephone Encounter (Signed)
    COVID-19 Pre-Screening Questions:  . In the past 7 to 10 days have you had a cough,  shortness of breath, headache, congestion, fever (100 or greater) body aches, chills, sore throat, or sudden loss of taste or sense of smell? No . Have you been around anyone with known Covid 19. No . Have you been around anyone who is awaiting Covid 19 test results in the past 7 to 10 days? No . Have you been around anyone who has been exposed to Covid 19, or has mentioned symptoms of Covid 19 within the past 7 to 10 days? No  If you have any concerns/questions about symptoms patients report during screening (either on the phone or at threshold). Contact the provider seeing the patient or DOD for further guidance.  If neither are available contact a member of the leadership team.         Pt answered no to all covid-19 prescreening questions. I asked the pt to wear a mask to his appointment. I let the pt know we are reducing the number of people coming into the office and if he could physically come into the office to please come alone. I told him if anything changes between now and his appointment time to call to let us know. The pt verbalized understanding.

## 2018-09-16 ENCOUNTER — Ambulatory Visit (INDEPENDENT_AMBULATORY_CARE_PROVIDER_SITE_OTHER): Payer: BC Managed Care – PPO | Admitting: *Deleted

## 2018-09-16 ENCOUNTER — Other Ambulatory Visit: Payer: Self-pay

## 2018-09-16 DIAGNOSIS — I428 Other cardiomyopathies: Secondary | ICD-10-CM

## 2018-09-16 LAB — CUP PACEART INCLINIC DEVICE CHECK
Battery Remaining Longevity: 136 mo
Battery Voltage: 3.14 V
Brady Statistic RV Percent Paced: 0.01 %
Date Time Interrogation Session: 20200903115309
HighPow Impedance: 66 Ohm
Implantable Lead Implant Date: 20200821
Implantable Lead Location: 753860
Implantable Lead Model: 6935
Implantable Pulse Generator Implant Date: 20200821
Lead Channel Impedance Value: 304 Ohm
Lead Channel Impedance Value: 399 Ohm
Lead Channel Pacing Threshold Amplitude: 1.5 V
Lead Channel Pacing Threshold Pulse Width: 0.4 ms
Lead Channel Sensing Intrinsic Amplitude: 14.875 mV
Lead Channel Setting Pacing Amplitude: 3.5 V
Lead Channel Setting Pacing Pulse Width: 0.4 ms
Lead Channel Setting Sensing Sensitivity: 0.3 mV

## 2018-09-16 NOTE — Progress Notes (Signed)
Wound check appointment. Steri-strips removed. Wound without redness or edema. Incision edges approximated, wound well healed. Normal device function. Impedances consistent with implant measurements. R-waves 4.1 mv bipolar ( slightly lower than implant measurements), measurements taken tip to RV coil and were14.9 mV, will remain programmed bipolar until f/u with WC in 12/03/18. RV threshold 1.5 V @ 0.4 ms, will continue to monitor in acute phase. Device programmed at 3.5V for extra safety margin until 3 month visit. Histogram distribution appropriate for patient and level of activity. No mode switches or ventricular arrhythmias noted. Patient educated about wound care, arm mobility, lifting restrictions, shock plan. ROV with Dr Curt Bears 12/03/18 with next home scheduled 12/03/18 and every 3 months there after.

## 2018-10-18 ENCOUNTER — Other Ambulatory Visit (HOSPITAL_COMMUNITY): Payer: Self-pay

## 2018-10-18 MED ORDER — POTASSIUM CHLORIDE CRYS ER 20 MEQ PO TBCR: 40.0000 meq | EXTENDED_RELEASE_TABLET | Freq: Two times a day (BID) | ORAL | 3 refills | Status: DC

## 2018-10-18 MED ORDER — AMIODARONE HCL 200 MG PO TABS: 200.0000 mg | ORAL_TABLET | Freq: Every day | ORAL | 3 refills | Status: DC

## 2018-10-27 ENCOUNTER — Telehealth: Payer: Self-pay

## 2018-10-27 NOTE — Telephone Encounter (Signed)
Pt tried to send a manual transmission again but still received the error code 3230. Pt wants to talk with the device tech nurse

## 2018-10-27 NOTE — Telephone Encounter (Signed)
The pt states he felt some pain on his device site. I tried to get him to send a manual transmission with his home monitor however, it gave him the error code 3230. The pt thinks he got mini shocks. I told the pt if he got shock by his ICD it will fell like a mull kicking him in the chest. The pt states he worked out yesterday as well and do not know if he pulled something but he felt pain. I told the pt I will give him a call back in 30 minutes to retry sending the transmission. I told him if that do not work I will give him the number to [facility] support and let him talk with the nurse.

## 2018-10-27 NOTE — Telephone Encounter (Signed)
Patient reports feeling of stimulation like "shock" intermittently in left chest wall while sitting on the couch. No chest pain, no syncope, no palpitaions and no sob.  eror code received when attempted sending transmission. Will call tech support # provided and call back to notify device clinic when transmission is sent or if new monitor will be needed.

## 2018-10-27 NOTE — Telephone Encounter (Signed)
Remote transmission reviewed and ICD function WNL. No episodes of HVR, no recorded or treated events. Patient has f/u with Dr Curt Bears on 12/03/18. Will contact device clinic if he develops increase frequency of sensations reported, pain, dizziness, syncope, or sob. Shock plan reviewed with patient.

## 2018-10-27 NOTE — Telephone Encounter (Signed)
Pt sent manual transmission. I told the pt Roberto [name] is in a meeting but as soon as she get out she will give him a call back. Pt phone number is [phone]

## 2018-11-01 ENCOUNTER — Other Ambulatory Visit (HOSPITAL_COMMUNITY): Payer: Self-pay

## 2018-11-01 MED ORDER — DIGOXIN 125 MCG PO TABS: ORAL_TABLET | ORAL | 1 refills | Status: DC

## 2018-12-01 ENCOUNTER — Encounter (HOSPITAL_COMMUNITY): Payer: Self-pay | Admitting: Cardiology

## 2018-12-03 ENCOUNTER — Ambulatory Visit (INDEPENDENT_AMBULATORY_CARE_PROVIDER_SITE_OTHER): Payer: BC Managed Care – PPO | Admitting: Cardiology

## 2018-12-03 ENCOUNTER — Encounter: Payer: BC Managed Care – PPO | Admitting: *Deleted

## 2018-12-03 ENCOUNTER — Encounter: Payer: Self-pay | Admitting: Cardiology

## 2018-12-03 ENCOUNTER — Other Ambulatory Visit: Payer: Self-pay

## 2018-12-03 ENCOUNTER — Other Ambulatory Visit: Payer: Self-pay | Admitting: Family Medicine

## 2018-12-03 VITALS — BP 96/60 | HR 77 | Ht 68.0 in | Wt 333.4 lb

## 2018-12-03 DIAGNOSIS — Z79899 Other long term (current) drug therapy: Secondary | ICD-10-CM | POA: Diagnosis not present

## 2018-12-03 DIAGNOSIS — I5022 Chronic systolic (congestive) heart failure: Secondary | ICD-10-CM | POA: Diagnosis not present

## 2018-12-03 NOTE — Progress Notes (Signed)
Electrophysiology Office Note   Date:  12/03/2018   ID:  Roberto [name], DOB [birth year], MRN [id number]  PCP:  [name], MD  Cardiologist:  Bensimhon Primary Electrophysiologist:  Clevie Prout Jorja Loa, MD    No chief complaint on file.    History of Present Illness: Roberto [name] is a 53 y.o. male who is being seen today for the evaluation of atrial flutter at the request of [name], MD. Presenting today for electrophysiology evaluation. 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 He was admitted on 10/14/16 with atrial fibrillation and tachycardia-induced myopathy. His EF dropped from 40% to 15%. He underwent cardioversion to sinus rhythm and was placed on amiodarone. He was diuresed 12 pounds with a discharge weight of 279 pounds. He returned to her failure clinic in atrial flutter with a rate of 115.  Had typical atrial flutter ablation on 11/17/16.  He underwent cardioversion for atrial fibrillation 03/20/2017.  Today, denies symptoms of palpitations, chest pain, shortness of breath, orthopnea, PND, lower extremity edema, claudication, dizziness, presyncope, syncope, bleeding, or neurologic sequela. The patient is tolerating medications without difficulties.     Past Medical History:  Diagnosis Date  . Arthritis   . Chronic combined systolic and diastolic CHF (congestive heart failure) (HCC)   . Deep vein thrombosis (HCC)   . Lupus anticoagulant positive   . Morbid obesity (HCC)   . Mural thrombus of heart    coumadin  . Nonischemic cardiomyopathy (HCC)    a) 12/17/11 echo: LVEF 25-30%, grade 3 diastolic dysfunction (c/w restriction), mod MR, mod LA/LV and mild RA dilatation; b) 12/18/11 cMRI: LVEF 38%, mod LV/mild RV dilatation, global HK, mild-mod RV sys dysfxn, no LV thrombus & patchy non-subendocardial delayed  enhancement c/w infil dz or prior myocarditis; c. 07/2018 EF 25-30%.  . Paroxysmal SVT (supraventricular tachycardia) (HCC)   . Pulmonary embolism (HCC)    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   Past Surgical History:  Procedure Laterality Date  . A-FLUTTER ABLATION N/A 11/17/2016   Procedure: A-FLUTTER ABLATION;  Surgeon: Regan Lemming, MD;  Location: [facility] INVASIVE CV LAB;  Service: Cardiovascular;  Laterality: N/A;  . CARDIAC CATHETERIZATION  06/2006   Angiographically normal cors  . CARDIAC CATHETERIZATION  12/22/2011   R/LHC: normal cors, well-compensated HDs, LV dysfxn  . CARDIOVERSION N/A 10/17/2016   Procedure: CARDIOVERSION;  Surgeon: Dolores Patty, MD;  Location: [facility] ENDOSCOPY;  Service: Cardiovascular;  Laterality: N/A;  . CARDIOVERSION N/A 03/20/2017   Procedure: CARDIOVERSION;  Surgeon: Dolores Patty, MD;  Location: [facility] ENDOSCOPY;  Service: Cardiovascular;  Laterality: N/A;  . ICD IMPLANT N/A 09/03/2018   Procedure: ICD IMPLANT;  Surgeon: Regan Lemming, MD;  Location: [facility] INVASIVE CV LAB;  Service: Cardiovascular;  Laterality: N/A;  . LEFT AND RIGHT HEART CATHETERIZATION WITH CORONARY ANGIOGRAM N/A 12/22/2011   Procedure: LEFT AND RIGHT HEART CATHETERIZATION WITH CORONARY ANGIOGRAM;  Surgeon: Dolores Patty, MD;  Location: [facility] CATH LAB;  Service: Cardiovascular;  Laterality: N/A;  . PATELLAR TENDON REPAIR     Left  . RIGHT HEART CATH N/A 10/20/2016   Procedure: RIGHT HEART CATH;  Surgeon: Dolores Patty, MD;  Location: [facility] INVASIVE CV LAB;  Service: Cardiovascular;  Laterality: N/A;  . TEE WITHOUT CARDIOVERSION N/A 03/20/2017   Procedure: TRANSESOPHAGEAL ECHOCARDIOGRAM (TEE);  Surgeon: Dolores Patty, MD;  Location: [facility] ENDOSCOPY;  Service: Cardiovascular;  Laterality: N/A;     Current Outpatient Medications  Medication Sig Dispense Refill  . acetaminophen (TYLENOL) 650 MG CR tablet Take 1,300 mg by mouth every 8 (eight) hours as needed for pain.     [name] amiodarone (PACERONE) 200 MG tablet Take 1 tablet (200 mg total) by mouth daily. 30 tablet 3  . coconut oil OIL Apply 1 application topically as needed (dry skin).     [name] digoxin (LANOXIN) 0.125 MG tablet TAKE 1 TABLET(0.125 MG) BY MOUTH DAILY 90 tablet 1  . ELIQUIS 5 MG TABS tablet TAKE 1 TABLET(5 MG) BY MOUTH TWICE DAILY (Patient taking differently: Take 5 mg by mouth 2 (two) times daily. ) 60 tablet 11  . ENTRESTO 49-51 MG TAKE 1 TABLET BY MOUTH TWICE DAILY 180 tablet 2  . potassium chloride SA (KLOR-CON) 20 MEQ tablet Take 2 tablets (40 mEq total) by mouth 2 (two) times daily. 120 tablet 3  . sildenafil (VIAGRA) 100 MG tablet TAKE 1/2 TO 1 TABLET(50 TO 100 MG) BY MOUTH DAILY AS NEEDED FOR ERECTILE DYSFUNCTION (Patient taking differently: Take 50-100 mg by mouth as needed for erectile dysfunction. ) 10 tablet 5  . spironolactone (ALDACTONE) 25 MG tablet TAKE 1 TABLET(25 MG) BY MOUTH DAILY 90 tablet 1  . torsemide (DEMADEX) 20 MG tablet TAKE 2 TABLETS BY MOUTH TWICE DAILY. STOP LASIX (Patient taking differently: Take 40 mg by mouth 2 (two) times daily. ) 120 tablet 5   No current facility-administered medications for this visit.     Allergies:   Shrimp [shellfish allergy]   Social History:  The patient  reports that he has never smoked. He has never used smokeless tobacco. He reports that he does not drink alcohol or use drugs.   Family History:  [name] family history includes Cancer in his mother; Clotting disorder in his mother; Diabetes in his father, sister, and another family member; Heart attack in his father; Hypertension in his mother and another family member; Obesity in his sister, sister and another family member.   ROS:  Please see the history of present illness.   Otherwise, review of systems is positive for .   All other systems are reviewed and negative.   PHYSICAL EXAM: VS:  BP 96/60   Pulse 77   Ht 5\' 8"  (1.727 m)   Wt (!) 333 lb 6.4 oz (151.2 kg)   SpO2 94%    BMI 50.69 kg/m  , BMI Body mass index is 50.69 kg/m. GEN: Well nourished, well developed, in no acute distress  HEENT: normal  Neck: no JVD, carotid bruits, or masses Cardiac: RRR; no murmurs, rubs, or gallops,no edema  Respiratory:  clear to auscultation bilaterally, normal work of breathing GI: soft, nontender, nondistended, + BS MS: no deformity or atrophy  Skin: warm and dry, device site well healed Neuro:  Strength and sensation are intact Psych: euthymic mood, full affect  EKG:  EKG is ordered today. Personal review of the ekg ordered shows sinus rhythm, rate 77, PVC, IVCD  Personal review of the device interrogation today. Results in [place]: 08/26/2018: BUN 14; Creatinine, Ser 1.44; Hemoglobin 15.0; Platelets 227; Potassium 4.6; Sodium 140    Lipid Panel     Component Value Date/Time   CHOL 107 (L) 09/18/2015 1013   TRIG 68 09/18/2015 1013   HDL 40 09/18/2015 1013   CHOLHDL 2.7 09/18/2015 1013   VLDL 14 09/18/2015 1013  LDLCALC 53 09/18/2015 1013     Wt Readings from Last 3 Encounters:  12/03/18 (!) 333 lb 6.4 oz (151.2 kg)  09/04/18 (!) 328 lb 12.8 oz (149.1 kg)  07/12/18 (!) 342 lb (155.1 kg)      Other studies Reviewed: Additional studies/ records that were reviewed today include: TTE 08/10/17  Review of the above records today demonstrates:  - Left ventricle: The cavity size was severely dilated. Wall   thickness was normal. Systolic function was severely reduced. The   estimated ejection fraction was in the range of 20% to 25%.   Diffuse hypokinesis. Doppler parameters are consistent with   restrictive physiology, indicative of decreased left ventricular   diastolic compliance and/or increased left atrial pressure. - Aortic valve: There was trivial regurgitation. - Left atrium: The atrium was severely dilated.    ASSESSMENT AND PLAN:  1.  Chronic systolic heart failure: Currently on optimal medical therapy.  Status post [employer]  ICD implanted 09/03/2018.  Device functioning appropriately.  No changes.    2.  Typical atrial flutter: Status post ablation 11/17/2016.  Currently on amiodarone and Eliquis as he has had a second atypical atrial flutter.   This patients CHA2DS2-VASc Score and unadjusted Ischemic Stroke Rate (% per year) is equal to 0.6 % stroke rate/year from a score of 1  Above score calculated as 1 point each if present [CHF, HTN, DM, Vascular=MI/PAD/Aortic Plaque, Age if 65-74, or Male] Above score calculated as 2 points each if present [Age > 75, or Stroke/TIA/TE]   3. Morbid obesity: Weight loss encouraged  4. DVT/PE: Currently on Eliquis per primary cardiology and primary physician   Current medicines are reviewed at length with the patient today.   The patient does not have concerns regarding his medicines.  The following changes were made today:  none  Labs/ tests ordered today include:  Orders Placed This Encounter  Procedures  . TSH  . Hepatic function panel  . EKG 12-Lead   Case discussed with primary cardiology  Disposition:   FU with Avneet Ashmore 9 months  Signed, Nika Yazzie Jorja Loa, MD  12/03/2018 2:56 PM     [facility] [address] [place] [address] [phone] (office) [phone] (fax)

## 2018-12-03 NOTE — Addendum Note (Signed)
Addended by: Eulis Foster on: 12/03/2018 02:59 PM   Modules accepted: Orders

## 2018-12-03 NOTE — Patient Instructions (Signed)
Medication Instructions:   *If you need a refill on your cardiac medications before your next appointment, please call your pharmacy*  Lab Work: TSH and LFT  If you have labs (blood work) drawn today and your tests are completely normal, you will receive your results only by: [name] MyChart Message (if you have MyChart) OR . A paper copy in the mail If you have any lab test that is abnormal or we need to change your treatment, we will call you to review the results.  Testing/Procedures:   Follow-Up: At [facility], you and your health needs are our priority.  As part of our continuing mission to provide you with exceptional heart care, we have created designated Provider Care Teams.  These Care Teams include your primary Cardiologist (physician) and Advanced Practice Providers (APPs -  Physician Assistants and Nurse Practitioners) who all work together to provide you with the care you need, when you need it.  Your next appointment:   9 month(s)  The format for your next appointment:   Either In Person or Virtual  Provider:   Allegra Lai, MD  Other Instructions

## 2018-12-03 NOTE — Telephone Encounter (Signed)
Is this appropriate to fill ? 

## 2018-12-04 LAB — HEPATIC FUNCTION PANEL
ALT: 11 IU/L (ref 0–44)
AST: 19 IU/L (ref 0–40)
Albumin: 3.6 g/dL — ABNORMAL LOW (ref 3.8–4.9)
Alkaline Phosphatase: 53 IU/L (ref 39–117)
Bilirubin Total: 0.3 mg/dL (ref 0.0–1.2)
Bilirubin, Direct: 0.11 mg/dL (ref 0.00–0.40)
Total Protein: 7.4 g/dL (ref 6.0–8.5)

## 2018-12-04 LAB — TSH: TSH: 2.03 u[IU]/mL (ref 0.450–4.500)

## 2018-12-19 ENCOUNTER — Emergency Department (HOSPITAL_BASED_OUTPATIENT_CLINIC_OR_DEPARTMENT_OTHER): Payer: BC Managed Care – PPO

## 2018-12-19 ENCOUNTER — Emergency Department (HOSPITAL_BASED_OUTPATIENT_CLINIC_OR_DEPARTMENT_OTHER)
Admission: EM | Admit: 2018-12-19 | Discharge: 2018-12-19 | Disposition: A | Discharge status: Home / Self Care | Payer: BC Managed Care – PPO | Source: Home / Self Care | Attending: Emergency Medicine | Admitting: Emergency Medicine

## 2018-12-19 ENCOUNTER — Encounter (HOSPITAL_BASED_OUTPATIENT_CLINIC_OR_DEPARTMENT_OTHER): Payer: Self-pay | Admitting: *Deleted

## 2018-12-19 ENCOUNTER — Encounter (HOSPITAL_BASED_OUTPATIENT_CLINIC_OR_DEPARTMENT_OTHER): Payer: Self-pay | Admitting: Emergency Medicine

## 2018-12-19 ENCOUNTER — Inpatient Hospital Stay (HOSPITAL_BASED_OUTPATIENT_CLINIC_OR_DEPARTMENT_OTHER)
Admission: EM | Admit: 2018-12-19 | Discharge: 2019-01-24 | DRG: 329 | Disposition: A | Discharge status: Skilled Nursing Facility | Payer: BC Managed Care – PPO | Attending: Internal Medicine | Admitting: Internal Medicine

## 2018-12-19 ENCOUNTER — Other Ambulatory Visit: Payer: Self-pay

## 2018-12-19 DIAGNOSIS — Z7901 Long term (current) use of anticoagulants: Secondary | ICD-10-CM | POA: Insufficient documentation

## 2018-12-19 DIAGNOSIS — Z23 Encounter for immunization: Secondary | ICD-10-CM

## 2018-12-19 DIAGNOSIS — K56699 Other intestinal obstruction unspecified as to partial versus complete obstruction: Secondary | ICD-10-CM | POA: Diagnosis present

## 2018-12-19 DIAGNOSIS — Z09 Encounter for follow-up examination after completed treatment for conditions other than malignant neoplasm: Secondary | ICD-10-CM

## 2018-12-19 DIAGNOSIS — M25521 Pain in right elbow: Secondary | ICD-10-CM | POA: Diagnosis not present

## 2018-12-19 DIAGNOSIS — R1031 Right lower quadrant pain: Secondary | ICD-10-CM | POA: Diagnosis not present

## 2018-12-19 DIAGNOSIS — I480 Paroxysmal atrial fibrillation: Secondary | ICD-10-CM | POA: Diagnosis present

## 2018-12-19 DIAGNOSIS — K769 Liver disease, unspecified: Secondary | ICD-10-CM | POA: Diagnosis present

## 2018-12-19 DIAGNOSIS — T8132XA Disruption of internal operation (surgical) wound, not elsewhere classified, initial encounter: Secondary | ICD-10-CM | POA: Diagnosis not present

## 2018-12-19 DIAGNOSIS — R059 Cough, unspecified: Secondary | ICD-10-CM

## 2018-12-19 DIAGNOSIS — K5720 Diverticulitis of large intestine with perforation and abscess without bleeding: Principal | ICD-10-CM | POA: Diagnosis present

## 2018-12-19 DIAGNOSIS — Z86711 Personal history of pulmonary embolism: Secondary | ICD-10-CM

## 2018-12-19 DIAGNOSIS — A419 Sepsis, unspecified organism: Secondary | ICD-10-CM | POA: Diagnosis not present

## 2018-12-19 DIAGNOSIS — I872 Venous insufficiency (chronic) (peripheral): Secondary | ICD-10-CM | POA: Diagnosis present

## 2018-12-19 DIAGNOSIS — I5042 Chronic combined systolic (congestive) and diastolic (congestive) heart failure: Secondary | ICD-10-CM | POA: Insufficient documentation

## 2018-12-19 DIAGNOSIS — Z79899 Other long term (current) drug therapy: Secondary | ICD-10-CM | POA: Insufficient documentation

## 2018-12-19 DIAGNOSIS — I471 Supraventricular tachycardia, unspecified: Secondary | ICD-10-CM | POA: Diagnosis present

## 2018-12-19 DIAGNOSIS — Z95810 Presence of automatic (implantable) cardiac defibrillator: Secondary | ICD-10-CM

## 2018-12-19 DIAGNOSIS — K56609 Unspecified intestinal obstruction, unspecified as to partial versus complete obstruction: Secondary | ICD-10-CM

## 2018-12-19 DIAGNOSIS — N62 Hypertrophy of breast: Secondary | ICD-10-CM | POA: Diagnosis present

## 2018-12-19 DIAGNOSIS — E876 Hypokalemia: Secondary | ICD-10-CM | POA: Diagnosis present

## 2018-12-19 DIAGNOSIS — Z8249 Family history of ischemic heart disease and other diseases of the circulatory system: Secondary | ICD-10-CM

## 2018-12-19 DIAGNOSIS — I5022 Chronic systolic (congestive) heart failure: Secondary | ICD-10-CM | POA: Diagnosis present

## 2018-12-19 DIAGNOSIS — G894 Chronic pain syndrome: Secondary | ICD-10-CM | POA: Diagnosis present

## 2018-12-19 DIAGNOSIS — Z01810 Encounter for preprocedural cardiovascular examination: Secondary | ICD-10-CM

## 2018-12-19 DIAGNOSIS — Z91013 Allergy to seafood: Secondary | ICD-10-CM

## 2018-12-19 DIAGNOSIS — K5900 Constipation, unspecified: Secondary | ICD-10-CM | POA: Insufficient documentation

## 2018-12-19 DIAGNOSIS — D62 Acute posthemorrhagic anemia: Secondary | ICD-10-CM | POA: Diagnosis not present

## 2018-12-19 DIAGNOSIS — Z86718 Personal history of other venous thrombosis and embolism: Secondary | ICD-10-CM

## 2018-12-19 DIAGNOSIS — N182 Chronic kidney disease, stage 2 (mild): Secondary | ICD-10-CM | POA: Diagnosis present

## 2018-12-19 DIAGNOSIS — R6521 Severe sepsis with septic shock: Secondary | ICD-10-CM | POA: Diagnosis not present

## 2018-12-19 DIAGNOSIS — D72829 Elevated white blood cell count, unspecified: Secondary | ICD-10-CM

## 2018-12-19 DIAGNOSIS — J9690 Respiratory failure, unspecified, unspecified whether with hypoxia or hypercapnia: Secondary | ICD-10-CM

## 2018-12-19 DIAGNOSIS — Z832 Family history of diseases of the blood and blood-forming organs and certain disorders involving the immune mechanism: Secondary | ICD-10-CM

## 2018-12-19 DIAGNOSIS — R911 Solitary pulmonary nodule: Secondary | ICD-10-CM | POA: Diagnosis present

## 2018-12-19 DIAGNOSIS — N179 Acute kidney failure, unspecified: Secondary | ICD-10-CM | POA: Diagnosis present

## 2018-12-19 DIAGNOSIS — K439 Ventral hernia without obstruction or gangrene: Secondary | ICD-10-CM | POA: Diagnosis present

## 2018-12-19 DIAGNOSIS — D638 Anemia in other chronic diseases classified elsewhere: Secondary | ICD-10-CM | POA: Diagnosis present

## 2018-12-19 DIAGNOSIS — Z6841 Body Mass Index (BMI) 40.0 and over, adult: Secondary | ICD-10-CM

## 2018-12-19 DIAGNOSIS — R5082 Postprocedural fever: Secondary | ICD-10-CM | POA: Diagnosis not present

## 2018-12-19 DIAGNOSIS — R188 Other ascites: Secondary | ICD-10-CM | POA: Diagnosis not present

## 2018-12-19 DIAGNOSIS — K635 Polyp of colon: Secondary | ICD-10-CM | POA: Diagnosis present

## 2018-12-19 DIAGNOSIS — T8143XA Infection following a procedure, organ and space surgical site, initial encounter: Secondary | ICD-10-CM

## 2018-12-19 DIAGNOSIS — Z713 Dietary counseling and surveillance: Secondary | ICD-10-CM

## 2018-12-19 DIAGNOSIS — K659 Peritonitis, unspecified: Secondary | ICD-10-CM | POA: Diagnosis not present

## 2018-12-19 DIAGNOSIS — I428 Other cardiomyopathies: Secondary | ICD-10-CM

## 2018-12-19 DIAGNOSIS — E860 Dehydration: Secondary | ICD-10-CM | POA: Diagnosis present

## 2018-12-19 DIAGNOSIS — K651 Peritoneal abscess: Secondary | ICD-10-CM | POA: Diagnosis not present

## 2018-12-19 DIAGNOSIS — I5023 Acute on chronic systolic (congestive) heart failure: Secondary | ICD-10-CM

## 2018-12-19 DIAGNOSIS — I5043 Acute on chronic combined systolic (congestive) and diastolic (congestive) heart failure: Secondary | ICD-10-CM | POA: Diagnosis present

## 2018-12-19 DIAGNOSIS — Z20822 Contact with and (suspected) exposure to covid-19: Secondary | ICD-10-CM | POA: Diagnosis present

## 2018-12-19 DIAGNOSIS — M25561 Pain in right knee: Secondary | ICD-10-CM | POA: Diagnosis present

## 2018-12-19 DIAGNOSIS — K5792 Diverticulitis of intestine, part unspecified, without perforation or abscess without bleeding: Secondary | ICD-10-CM | POA: Diagnosis present

## 2018-12-19 DIAGNOSIS — D6862 Lupus anticoagulant syndrome: Secondary | ICD-10-CM | POA: Diagnosis present

## 2018-12-19 DIAGNOSIS — K5939 Other megacolon: Secondary | ICD-10-CM | POA: Diagnosis present

## 2018-12-19 DIAGNOSIS — K641 Second degree hemorrhoids: Secondary | ICD-10-CM | POA: Diagnosis present

## 2018-12-19 DIAGNOSIS — G8929 Other chronic pain: Secondary | ICD-10-CM | POA: Diagnosis present

## 2018-12-19 DIAGNOSIS — J95821 Acute postprocedural respiratory failure: Secondary | ICD-10-CM | POA: Diagnosis not present

## 2018-12-19 HISTORY — DX: Diverticulitis of intestine, part unspecified, without perforation or abscess without bleeding: K57.92

## 2018-12-19 LAB — URINALYSIS, ROUTINE W REFLEX MICROSCOPIC
Glucose, UA: NEGATIVE mg/dL
Ketones, ur: NEGATIVE mg/dL
Leukocytes,Ua: NEGATIVE
Nitrite: NEGATIVE
Protein, ur: >300 mg/dL — AB
Specific Gravity, Urine: >1.03 — ABNORMAL HIGH (ref 1.005–1.030)
pH: 5.5 (ref 5.0–8.0)

## 2018-12-19 LAB — COMPREHENSIVE METABOLIC PANEL
ALT: 13 U/L (ref 0–44)
AST: 21 U/L (ref 15–41)
Albumin: 3.2 g/dL — ABNORMAL LOW (ref 3.5–5.0)
Alkaline Phosphatase: 45 U/L (ref 38–126)
Anion gap: 11 (ref 5–15)
BUN: 17 mg/dL (ref 6–20)
CO2: 28 mmol/L (ref 22–32)
Calcium: 8.6 mg/dL — ABNORMAL LOW (ref 8.9–10.3)
Chloride: 101 mmol/L (ref 98–111)
Creatinine, Ser: 1.42 mg/dL — ABNORMAL HIGH (ref 0.61–1.24)
GFR calc Af Amer: >60 mL/min (ref >60)
GFR calc non Af Amer: 56 mL/min — ABNORMAL LOW (ref >60)
Glucose, Bld: 122 mg/dL — ABNORMAL HIGH (ref 70–99)
Potassium: 3.6 mmol/L (ref 3.5–5.1)
Sodium: 140 mmol/L (ref 135–145)
Total Bilirubin: 0.9 mg/dL (ref 0.3–1.2)
Total Protein: 7.7 g/dL (ref 6.5–8.1)

## 2018-12-19 LAB — CBC WITH DIFFERENTIAL/PLATELET
Abs Immature Granulocytes: 0.13 10*3/uL — ABNORMAL HIGH (ref 0.00–0.07)
Basophils Absolute: 0 10*3/uL (ref 0.0–0.1)
Basophils Relative: 0 %
Eosinophils Absolute: 0 10*3/uL (ref 0.0–0.5)
Eosinophils Relative: 0 %
HCT: 50.3 % (ref 39.0–52.0)
Hemoglobin: 15.9 g/dL (ref 13.0–17.0)
Immature Granulocytes: 1 %
Lymphocytes Relative: 7 %
Lymphs Abs: 1.4 10*3/uL (ref 0.7–4.0)
MCH: 28.8 pg (ref 26.0–34.0)
MCHC: 31.6 g/dL (ref 30.0–36.0)
MCV: 91.1 fL (ref 80.0–100.0)
Monocytes Absolute: 1.2 10*3/uL — ABNORMAL HIGH (ref 0.1–1.0)
Monocytes Relative: 6 %
Neutro Abs: 16.9 10*3/uL — ABNORMAL HIGH (ref 1.7–7.7)
Neutrophils Relative %: 86 %
Platelets: 221 10*3/uL (ref 150–400)
RBC: 5.52 MIL/uL (ref 4.22–5.81)
RDW: 13.2 % (ref 11.5–15.5)
WBC: 19.7 10*3/uL — ABNORMAL HIGH (ref 4.0–10.5)
nRBC: 0 % (ref 0.0–0.2)

## 2018-12-19 LAB — URINALYSIS, MICROSCOPIC (REFLEX): WBC, UA: NONE SEEN WBC/hpf (ref 0–5)

## 2018-12-19 LAB — LIPASE, BLOOD: Lipase: 27 U/L (ref 11–51)

## 2018-12-19 MED ORDER — IOHEXOL 300 MG/ML  SOLN
100.0000 mL | Freq: Once | INTRAMUSCULAR | Status: AC | PRN
  Administered 2018-12-19: 100 mL via INTRAVENOUS

## 2018-12-19 MED ORDER — FENTANYL CITRATE (PF) 100 MCG/2ML IJ SOLN
50.0000 ug | Freq: Once | INTRAMUSCULAR | Status: AC
  Administered 2018-12-19: 50 ug via INTRAVENOUS
  Filled 2018-12-19: qty 2

## 2018-12-19 MED ORDER — FENTANYL CITRATE (PF) 100 MCG/2ML IJ SOLN
50.0000 ug | Freq: Once | INTRAMUSCULAR | Status: AC
  Administered 2018-12-19: 21:00:00 50 ug via INTRAVENOUS
  Filled 2018-12-19: qty 2

## 2018-12-19 MED ORDER — FLEET ENEMA 7-19 GM/118ML RE ENEM: 1.0000 | ENEMA | Freq: Every day | RECTAL | 0 refills | Status: DC | PRN

## 2018-12-19 MED ORDER — PIPERACILLIN-TAZOBACTAM 3.375 G IVPB 30 MIN
3.3750 g | Freq: Once | INTRAVENOUS | Status: AC
  Administered 2018-12-19: 3.375 g via INTRAVENOUS
  Filled 2018-12-19 (×2): qty 50

## 2018-12-19 MED ORDER — GOLYTELY 236 G PO SOLR: ORAL | 0 refills | Status: DC

## 2018-12-19 NOTE — ED Provider Notes (Signed)
[place] EMERGENCY DEPARTMENT Provider Note   CSN: [id number] Arrival date & time: 12/19/18  1950     History   Chief Complaint Chief Complaint  Patient presents with   Abdominal Pain    HPI Roberto KIRSCHENMANN is a 53 y.o. male.     The history is provided by the patient and medical records. No language interpreter was used.  Abdominal Pain  Roberto [name] is a 53 y.o. male who presents to the Emergency Department complaining of abdominal pain. He presents the emergency department complaining of constipation abdominal pain. Constipation began first with last bowel movement being on Tuesday of this week. Yesterday he developed generalized abdominal cramping and today he developed significant right lower quadrant pain. He denies any fevers. He does have nausea and food aversion. He was seen in the emergency department yesterday and treated with a bowel prep. He was able to take about half of it before he had worsening abdominal cramping and nausea and was unable to complete it. He denies any fevers, chest pain, shortness of breath. He does have an extensive cardiac history and he takes eliquis. No prior abdominal surgeries. No prior similar symptoms. Past Medical History:  Diagnosis Date   Arthritis    Chronic combined systolic and diastolic CHF (congestive heart failure) (HCC)    Deep vein thrombosis (HCC)    Lupus anticoagulant positive    Morbid obesity (HCC)    Mural thrombus of heart    coumadin   Nonischemic cardiomyopathy ([place])    a) 12/17/11 echo: LVEF 92-42%, grade 3 diastolic dysfunction (c/w restriction), mod MR, mod LA/LV and mild RA dilatation; b) 12/18/11 cMRI: LVEF 38%, mod LV/mild RV dilatation, global HK, mild-mod RV sys dysfxn, no LV thrombus & patchy non-subendocardial delayed enhancement c/w infil dz or prior myocarditis; c. 07/2018 EF 25-30%.   Paroxysmal SVT (supraventricular tachycardia) (HCC)    Pulmonary embolism (HCC)    DVT and PE after  knee surgery in 2007    Patient Active Problem List   Diagnosis Date Noted   Acute diverticulitis 12/20/2018   History of LV (left ventricular) mural thrombus 09/04/2018   History of DVT (deep vein thrombosis) 09/04/2018   PAF (paroxysmal atrial fibrillation) ([place]) 09/04/2018   Rash 05/06/2018   Elevated uric acid in blood 05/06/2018   Atypical atrial flutter ([place]) 03/17/2017   Chronic pain of right knee 03/13/2017   Typical atrial flutter s/p catheter ablation    Varicose veins of legs 12/17/2015   Encounter for health maintenance examination in adult 12/17/2015   Special screening for malignant neoplasms, colon 12/17/2015   Vaccine counseling 12/17/2015   Screening for prostate cancer 12/17/2015   Knee deformity, acquired, right 12/17/2015   Gait disturbance 12/17/2015   Osteoarthritis of right knee 12/17/2015   Varicose veins of lower extremities with ulcer ([place]) 09/18/2015   Need for prophylactic vaccination and inoculation against influenza 09/18/2015   Tinea versicolor 06/15/2015   Impaired fasting blood sugar 12/28/2014   Long term current use of anticoagulant therapy 12/28/2014   Erectile dysfunction 68/34/1962   Chronic systolic heart failure ([place]) 12/29/2011   Hypokalemia 12/24/2011   Lupus anticoagulant positive 12/22/2011   Nonischemic cardiomyopathy ([place]) 12/16/2011   Paroxysmal SVT (supraventricular tachycardia) ([place]) 12/16/2011   History of venous thromboembolism 12/16/2011   Venous insufficiency 11/04/2011   Obesity, morbid ([place]) 06/02/2008    Past Surgical History:  Procedure Laterality Date   A-FLUTTER ABLATION N/A 11/17/2016   Procedure: A-FLUTTER ABLATION;  Surgeon:  Regan Lemmingamnitz, Will Martin, MD;  Location: [facility];  Service: Cardiovascular;  Laterality: N/A;   CARDIAC CATHETERIZATION  06/2006   Angiographically normal cors   CARDIAC CATHETERIZATION  12/22/2011   R/LHC: normal cors, well-compensated HDs, LV dysfxn     CARDIOVERSION N/A 10/17/2016   Procedure: CARDIOVERSION;  Surgeon: Dolores PattyBensimhon, Daniel R, MD;  [facility];  Service: Cardiovascular;  Laterality: N/A;   CARDIOVERSION N/A 03/20/2017   Procedure: CARDIOVERSION;  Surgeon: Dolores PattyBensimhon, Daniel R, MD;  [facility];  Service: Cardiovascular;  Laterality: N/A;   ICD IMPLANT N/A 09/03/2018   Procedure: ICD IMPLANT;  Surgeon: Regan Lemmingamnitz, Will Martin, MD;  Location: [facility];  Service: Cardiovascular;  Laterality: N/A;   LEFT AND RIGHT HEART CATHETERIZATION WITH CORONARY ANGIOGRAM N/A 12/22/2011   Procedure: LEFT AND RIGHT HEART CATHETERIZATION WITH CORONARY ANGIOGRAM;  Surgeon: Dolores Pattyaniel R Bensimhon, MD;  [facility];  Service: Cardiovascular;  Laterality: N/A;   PATELLAR TENDON REPAIR     Left   RIGHT HEART CATH N/A 10/20/2016   Procedure: RIGHT HEART CATH;  Surgeon: Dolores PattyBensimhon, Daniel R, MD;  Location: [facility];  Service: Cardiovascular;  Laterality: N/A;   TEE WITHOUT CARDIOVERSION N/A 03/20/2017   Procedure: TRANSESOPHAGEAL ECHOCARDIOGRAM (TEE);  Surgeon: Dolores PattyBensimhon, Daniel R, MD;  [facility];  Service: Cardiovascular;  Laterality: N/A;        Home Medications    Prior to Admission medications   Medication Sig Start Date End Date Taking? Authorizing Provider  acetaminophen (TYLENOL) 650 MG CR tablet Take 1,300 mg by mouth every 8 (eight) hours as needed for pain.    [provider]  amiodarone (PACERONE) 200 MG tablet Take 1 tablet (200 mg total) by mouth daily. 10/18/18   Bensimhon, Bevelyn Bucklesaniel R, MD  coconut oil OIL Apply 1 application topically as needed (dry skin).     [provider]  digoxin (LANOXIN) 0.125 MG tablet TAKE 1 TABLET(0.125 MG) BY MOUTH DAILY 11/01/18   Bensimhon, Bevelyn Bucklesaniel R, MD  ELIQUIS 5 MG TABS tablet TAKE 1 TABLET(5 MG) BY MOUTH TWICE DAILY Patient taking differently: Take 5 mg by mouth 2 (two) times daily.  03/22/18   Bensimhon, Bevelyn Bucklesaniel R, MD  ENTRESTO 49-51  MG TAKE 1 TABLET BY MOUTH TWICE DAILY 09/15/18   Bensimhon, Bevelyn Bucklesaniel R, MD  polyethylene glycol (GOLYTELY) 236 g solution Drink 8 ounces of GoLYTELY every 15 minutes .  Do not drink the gallon in <3 hours.  Drink entire prep. 12/19/18   Horton, Mayer Maskerourtney F, MD  potassium chloride SA (KLOR-CON) 20 MEQ tablet Take 2 tablets (40 mEq total) by mouth 2 (two) times daily. 10/18/18   Bensimhon, Bevelyn Bucklesaniel R, MD  sildenafil (VIAGRA) 100 MG tablet TAKE 1/2 TO 1 TABLET(50 TO 100 MG) BY MOUTH DAILY AS NEEDED FOR ERECTILE DYSFUNCTION Patient taking differently: Take 50-100 mg by mouth as needed for erectile dysfunction.  07/13/17   Ronnald NianLalonde, [name], MD  sodium phosphate (FLEET) 7-19 GM/118ML ENEM Place 133 mLs (1 enema total) rectally daily as needed for severe constipation. 12/19/18   Horton, Mayer Maskerourtney F, MD  spironolactone (ALDACTONE) 25 MG tablet TAKE 1 TABLET(25 MG) BY MOUTH DAILY 12/03/18   Ronnald NianLalonde, [name], MD  torsemide (DEMADEX) 20 MG tablet TAKE 2 TABLETS BY MOUTH TWICE DAILY. STOP LASIX Patient taking differently: Take 40 mg by mouth 2 (two) times daily.  06/29/18   Bensimhon, Bevelyn Bucklesaniel R, MD    Family History Family History  Problem Relation Age of  Onset   Hypertension Mother    Clotting disorder Mother        mom with PEs   Cancer Mother        uterine cancer   Diabetes Father    Heart attack Father    Hypertension Other        Sibling   Diabetes Other    Obesity Other    Diabetes Sister    Obesity Sister    Obesity Sister    Colon cancer Neg Hx    Stomach cancer Neg Hx    Esophageal cancer Neg Hx    Rectal cancer Neg Hx    Liver cancer Neg Hx     Social History Social History   Tobacco Use   Smoking status: Never Smoker   Smokeless tobacco: Never Used  Substance Use Topics   Alcohol use: No    Comment: Rarely   Drug use: No     Allergies   Shrimp [shellfish allergy]   Review of Systems Review of Systems  Gastrointestinal: Positive for abdominal pain.  All other  systems reviewed and are negative.    Physical Exam Updated Vital Signs BP 107/72    Pulse 75    Temp 98.4 F (36.9 C)    Resp 13    Ht 5\' 8"  (1.727 m)    Wt (!) 151 kg    SpO2 94%    BMI 50.63 kg/m   Physical Exam Vitals signs and nursing note reviewed.  Constitutional:      Appearance: He is well-developed.  HENT:     Head: Normocephalic and atraumatic.  Cardiovascular:     Rate and Rhythm: Normal rate and regular rhythm.     Heart sounds: No murmur.  Pulmonary:     Effort: Pulmonary effort is normal. No respiratory distress.     Breath sounds: Normal breath sounds.  Abdominal:     Palpations: Abdomen is soft.     Tenderness: There is no guarding or rebound.     Comments: Moderate right upper and right lower quadrant tenderness to palpation  Musculoskeletal:        General: No tenderness.     Comments: Trace edema to bilateral lower extremities  Skin:    General: Skin is warm and dry.  Neurological:     Mental Status: He is alert and oriented to person, place, and time.  Psychiatric:        Behavior: Behavior normal.      ED Treatments / Results  Labs (all labs ordered are listed, but only abnormal results are displayed) Labs Reviewed  COMPREHENSIVE METABOLIC PANEL - Abnormal; Notable for the following components:      Result Value   Glucose, Bld 122 (*)    Creatinine, Ser 1.42 (*)    Calcium 8.6 (*)    Albumin 3.2 (*)    GFR calc non Af Amer 56 (*)    All other components within normal limits  CBC WITH DIFFERENTIAL/PLATELET - Abnormal; Notable for the following components:   WBC 19.7 (*)    Neutro Abs 16.9 (*)    Monocytes Absolute 1.2 (*)    Abs Immature Granulocytes 0.13 (*)    All other components within normal limits  URINALYSIS, ROUTINE W REFLEX MICROSCOPIC - Abnormal; Notable for the following components:   Specific Gravity, Urine >1.030 (*)    Hgb urine dipstick MODERATE (*)    Bilirubin Urine SMALL (*)    Protein, ur >300 (*)  All other  components within normal limits  URINALYSIS, MICROSCOPIC (REFLEX) - Abnormal; Notable for the following components:   Bacteria, UA RARE (*)    All other components within normal limits  SARS CORONAVIRUS 2 (TAT 6-24 HRS)  LIPASE, BLOOD    EKG EKG Interpretation  Date/Time:  Sunday December 19 2018 20:49:23 EST Ventricular Rate:  81 PR Interval:    QRS Duration: 126 QT Interval:  435 QTC Calculation: 505 R Axis:   -39 Text Interpretation: Sinus rhythm Multiform ventricular premature complexes Borderline prolonged PR interval Left atrial enlargement IVCD, consider atypical LBBB Confirmed by Tilden Fossa [phone]) on 12/19/2018 8:54:02 PM   Radiology Dg Abdomen 1 View  Result Date: 12/19/2018 CLINICAL DATA:  Abdominal pain and constipation. EXAM: ABDOMEN - 1 VIEW COMPARISON:  None. FINDINGS: Bowel gas is seen in the nondependent portion of the colon which is nondilated. A few gas-filled nondilated small bowel loops are seen in the central abdomen. No evidence of dilated bowel loops. No radiopaque calculi identified. IMPRESSION: Nonobstructive bowel gas pattern.  No acute findings Electronically Signed   By: Danae Orleans M.D.   On: 12/19/2018 06:12   Ct Abdomen Pelvis W Contrast  Result Date: 12/19/2018 CLINICAL DATA:  RIGHT lower quadrant pain and constipation for 2 days question appendicitis, history CHF, non ischemic cardiomyopathy, pulmonary embolism, PSVT EXAM: CT ABDOMEN AND PELVIS WITH CONTRAST TECHNIQUE: Multidetector CT imaging of the abdomen and pelvis was performed using the standard protocol following bolus administration of intravenous contrast. Sagittal and coronal MPR images reconstructed from axial data set. CONTRAST:  OMNIPAQUE IOHEXOL 300 MG/ML SOLN IV. No oral contrast. COMPARISON:  10/30/2008, 12/19/2005 FINDINGS: Lower chest: Lung bases clear.  AICD lead RIGHT ventricle. Hepatobiliary: Small low-attenuation foci within liver question cysts. Large mass lesion  identified at the medial aspect of the RIGHT lobe, 4.4 x 3.7 cm image 14, previously 7.6 x 6.9 cm, shown to be a large hemangioma on an earlier exam of 12/19/2005 and now further decreased in size. Remainder of liver unremarkable. Gallbladder normal appearance. Pancreas: Atrophic pancreas without mass Spleen: Normal appearance.  Tiny splenule anterior to spleen stable. Adrenals/Urinary Tract: Adrenal glands normal appearance. Small cortical scar at lateral aspect inferior pole RIGHT kidney. Kidneys ureters and bladder otherwise normal appearance. Stomach/Bowel: Normal appendix. Stomach decompressed. Small bowel loops unremarkable, decompressed. Distended fluid-filled colon from tip of cecum to distal descending colon. Abrupt change in caliber to normal caliber at the distal descending colon. Few sigmoid diverticula. Minimal infiltrative change at the segment of caliber transition. It is uncertain whether this represents an active inflammatory process such as subtle diverticulitis, colonic stricture, or mass. Vascular/Lymphatic: Aorta normal caliber. Vascular structures grossly patent. No adenopathy. Reproductive: Unremarkable prostate gland and seminal vesicles Other: No free air or free fluid.  No hernia. Musculoskeletal: Osseous structures unremarkable. IMPRESSION: Dilated fluid-filled colon from cecal tip to distal descending colon more an abrupt transition in caliber is identified, associated with minimal pericolic infiltrative changes. This could represent subtle diverticulitis though stricture and mass are not excluded. Follow-up colonoscopy recommended to exclude neoplasm. Normal appendix. Decrease in size of known hepatic hemangioma since 2010. Electronically Signed   By: Ulyses Southward M.D.   On: 12/19/2018 22:11    Procedures Procedures (including critical care time)  Medications Ordered in ED Medications  fentaNYL (SUBLIMAZE) injection 50 mcg (50 mcg Intravenous Given 12/19/18 2051)  iohexol  (OMNIPAQUE) 300 MG/ML solution 100 mL (100 mLs Intravenous Contrast Given 12/19/18 2131)  piperacillin-tazobactam (ZOSYN) IVPB 3.375  g (0 g Intravenous Stopped 12/20/18 0003)  fentaNYL (SUBLIMAZE) injection 50 mcg (50 mcg Intravenous Given 12/19/18 2311)     Initial Impression / Assessment and Plan / ED Course  I have reviewed the triage vital signs and the nursing notes.  Pertinent labs & imaging results that were available during my care of the patient were reviewed by me and considered in my medical decision making (see chart for details).        Patient with extensive cardiac history here for evaluation of constipation and progressive abdominal pain. He does have significant right sided abdominal tenderness on examination without peritoneal findings. Labs are significant for leukocytosis. CT abdomen pelvis with possible early diverticulitis, cannot rule out structure or mass. Given patient's ongoing pain, leukocytosis plan to treat with antibiotics and admit for observation. Hospitalist consulted for admission for further treatment.  Final Clinical Impressions(s) / ED Diagnoses   Final diagnoses:  Right lower quadrant abdominal pain  Acute diverticulitis  Leukocytosis, unspecified type    ED Discharge Orders    None       Tilden Fossaees, Maximino Cozzolino, MD 12/20/18 [id number]

## 2018-12-19 NOTE — ED Triage Notes (Signed)
Pt states he has felt constipated since Monday. Has taken gas-ex, miralax, prune juice, stool soften, and mag citrate without relief. Denies n/v. States no results from those medications. Denies any other symptoms. Denies fevers.

## 2018-12-19 NOTE — ED Triage Notes (Signed)
Pt c/o RLQ abdominal pain with constipation x 2 days. Pt d/c this am from this ED, states pain unresolved with prescribed medication.

## 2018-12-19 NOTE — ED Provider Notes (Signed)
[facility] EMERGENCY DEPARTMENT Provider Note   CSN: [id number] Arrival date & time: 12/19/18  0510     History   Chief Complaint Chief Complaint  Patient presents with  . abdominal cramping    HPI Roberto [name] is a 53 y.o. male.     HPI  This is a 53 year old male with a history of heart failure, nonischemic cardiomyopathy, pulmonary embolism who presents with abdominal cramping.  Patient reports that he has not had a good bowel movement since Monday.  He has had 1 bowel movement that was soft and small.  He reports that he has taken multiple medications including MiraLAX, mag citrate, stool softeners, and prune juice with minimal relief.  He has developed some abdominal cramping.  He states that the cramping seems to move around his abdomen depending upon how he is lying.  Nothing seems to make it better or worse.  Currently his cramping is 7 out of 10.  He is not had any nausea or vomiting.  Denies fevers, chest pain, shortness of breath.  No known sick contacts.  No prior abdominal surgeries.  Past Medical History:  Diagnosis Date  . Arthritis   . Chronic combined systolic and diastolic CHF (congestive heart failure) ([place])   . Deep vein thrombosis ([place])   . Lupus anticoagulant positive   . Morbid obesity ([place])   . Mural thrombus of heart    coumadin  . Nonischemic cardiomyopathy ([place])    a) 12/17/11 echo: LVEF 35-57%, grade 3 diastolic dysfunction (c/w restriction), mod MR, mod LA/LV and mild RA dilatation; b) 12/18/11 cMRI: LVEF 38%, mod LV/mild RV dilatation, global HK, mild-mod RV sys dysfxn, no LV thrombus & patchy non-subendocardial delayed enhancement c/w infil dz or prior myocarditis; c. 07/2018 EF 25-30%.  . Paroxysmal SVT (supraventricular tachycardia) ([place])   . Pulmonary embolism ([place])    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    Patient Active Problem List   Diagnosis Date Noted  . 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 History  of DVT (deep vein thrombosis) 09/04/2018  . PAF (paroxysmal atrial fibrillation) ([place]) 09/04/2018  . Rash 05/06/2018  . Elevated uric acid in blood 05/06/2018  . Atypical atrial flutter ([place]) 03/17/2017  . Chronic pain of right knee 03/13/2017  . Typical atrial flutter s/p catheter ablation   . Varicose veins of legs 12/17/2015  . Encounter for health maintenance examination in adult 12/17/2015  . Special screening for malignant neoplasms, colon 12/17/2015  . Vaccine counseling 12/17/2015  . Screening for prostate cancer 12/17/2015  . Knee deformity, acquired, right 12/17/2015  . Gait disturbance 12/17/2015  . Osteoarthritis of right knee 12/17/2015  . Varicose veins of lower extremities with ulcer ([place]) 09/18/2015  . Need for prophylactic vaccination and inoculation against influenza 09/18/2015  . Tinea versicolor 06/15/2015  . Impaired fasting blood sugar 12/28/2014  . Long term current use of anticoagulant therapy 12/28/2014  . Erectile dysfunction 02/19/2012  . Chronic systolic heart failure ([place]) 12/29/2011  . Hypokalemia 12/24/2011  . Lupus anticoagulant positive 12/22/2011  . Nonischemic cardiomyopathy ([place]) 12/16/2011  . Paroxysmal SVT (supraventricular tachycardia) ([place]) 12/16/2011  . History of venous thromboembolism 12/16/2011  . Venous insufficiency 11/04/2011  . Obesity, morbid ([place]) 06/02/2008    Past Surgical History:  Procedure Laterality Date  . A-FLUTTER ABLATION N/A 11/17/2016   Procedure: A-FLUTTER ABLATION;  Surgeon: Constance Haw, MD;  Location: [facility];  Service: Cardiovascular;  Laterality: N/A;  .  CARDIAC CATHETERIZATION  06/2006   Angiographically normal cors  . CARDIAC CATHETERIZATION  12/22/2011   R/LHC: normal cors, well-compensated HDs, LV dysfxn  . CARDIOVERSION N/A 10/17/2016   Procedure: CARDIOVERSION;  Surgeon: Dolores Patty, MD;  Location: [facility] ENDOSCOPY;  Service: Cardiovascular;  Laterality: N/A;  . CARDIOVERSION N/A  03/20/2017   Procedure: CARDIOVERSION;  Surgeon: Dolores Patty, MD;  Location: [facility] ENDOSCOPY;  Service: Cardiovascular;  Laterality: N/A;  . ICD IMPLANT N/A 09/03/2018   Procedure: ICD IMPLANT;  Surgeon: Regan Lemming, MD;  Location: [facility] INVASIVE CV LAB;  Service: Cardiovascular;  Laterality: N/A;  . LEFT AND RIGHT HEART CATHETERIZATION WITH CORONARY ANGIOGRAM N/A 12/22/2011   Procedure: LEFT AND RIGHT HEART CATHETERIZATION WITH CORONARY ANGIOGRAM;  Surgeon: Dolores Patty, MD;  Location: [facility] CATH LAB;  Service: Cardiovascular;  Laterality: N/A;  . PATELLAR TENDON REPAIR     Left  . RIGHT HEART CATH N/A 10/20/2016   Procedure: RIGHT HEART CATH;  Surgeon: Dolores Patty, MD;  Location: [facility] INVASIVE CV LAB;  Service: Cardiovascular;  Laterality: N/A;  . TEE WITHOUT CARDIOVERSION N/A 03/20/2017   Procedure: TRANSESOPHAGEAL ECHOCARDIOGRAM (TEE);  Surgeon: Dolores Patty, MD;  Location: [facility] ENDOSCOPY;  Service: Cardiovascular;  Laterality: N/A;        Home Medications    Prior to Admission medications   Medication Sig Start Date End Date Taking? Authorizing Provider  acetaminophen (TYLENOL) 650 MG CR tablet Take 1,300 mg by mouth every 8 (eight) hours as needed for pain.    [provider]  amiodarone (PACERONE) 200 MG tablet Take 1 tablet (200 mg total) by mouth daily. 10/18/18   Bensimhon, Bevelyn Buckles, MD  coconut oil OIL Apply 1 application topically as needed (dry skin).     [provider]  digoxin (LANOXIN) 0.125 MG tablet TAKE 1 TABLET(0.125 MG) BY MOUTH DAILY 11/01/18   Bensimhon, Bevelyn Buckles, MD  ELIQUIS 5 MG TABS tablet TAKE 1 TABLET(5 MG) BY MOUTH TWICE DAILY Patient taking differently: Take 5 mg by mouth 2 (two) times daily.  03/22/18   Bensimhon, Bevelyn Buckles, MD  ENTRESTO 49-51 MG TAKE 1 TABLET BY MOUTH TWICE DAILY 09/15/18   Bensimhon, Bevelyn Buckles, MD  polyethylene glycol (GOLYTELY) 236 g solution Drink 8 ounces of GoLYTELY every 15 minutes .  Do not drink the  gallon in <3 hours.  Drink entire prep. 12/19/18   Yassin Scales, Mayer Masker, MD  potassium chloride SA (KLOR-CON) 20 MEQ tablet Take 2 tablets (40 mEq total) by mouth 2 (two) times daily. 10/18/18   Bensimhon, Bevelyn Buckles, MD  sildenafil (VIAGRA) 100 MG tablet TAKE 1/2 TO 1 TABLET(50 TO 100 MG) BY MOUTH DAILY AS NEEDED FOR ERECTILE DYSFUNCTION Patient taking differently: Take 50-100 mg by mouth as needed for erectile dysfunction.  07/13/17   [name], MD  sodium phosphate (FLEET) 7-19 GM/118ML ENEM Place 133 mLs (1 enema total) rectally daily as needed for severe constipation. 12/19/18   Kyrsten Deleeuw, Mayer Masker, MD  spironolactone (ALDACTONE) 25 MG tablet TAKE 1 TABLET(25 MG) BY MOUTH DAILY 12/03/18   [name], MD  torsemide (DEMADEX) 20 MG tablet TAKE 2 TABLETS BY MOUTH TWICE DAILY. STOP LASIX Patient taking differently: Take 40 mg by mouth 2 (two) times daily.  06/29/18   Bensimhon, Bevelyn Buckles, MD    Family History Family History  Problem Relation Age of Onset  . Hypertension Mother   . Clotting disorder Mother        mom  with PEs  . Cancer Mother        uterine cancer  . Diabetes Father   . Heart attack Father   . Hypertension Other        Sibling  . Diabetes Other   . Obesity Other   . Diabetes Sister   . Obesity Sister   . Obesity Sister   . Colon cancer Neg Hx   . Stomach cancer Neg Hx   . Esophageal cancer Neg Hx   . Rectal cancer Neg Hx   . Liver cancer Neg Hx     Social History Social History   Tobacco Use  . Smoking status: Never Smoker  . Smokeless tobacco: Never Used  Substance Use Topics  . Alcohol use: No    Comment: Rarely  . Drug use: No     Allergies   Shrimp [shellfish allergy]   Review of Systems Review of Systems  Constitutional: Negative for fever.  Respiratory: Negative for shortness of breath.   Cardiovascular: Negative for chest pain.  Gastrointestinal: Positive for abdominal pain and constipation. Negative for abdominal distention, diarrhea,  nausea and vomiting.  Genitourinary: Negative for dysuria.  Musculoskeletal: Negative for back pain.  All other systems reviewed and are negative.    Physical Exam Updated Vital Signs BP 115/69 (BP Location: Right Arm)   Pulse 84   Temp 98.9 F (37.2 C)   Resp 18   Ht 1.727 m (5\' 8" )   Wt (!) 151 kg   SpO2 97%   BMI 50.63 kg/m   Physical Exam Vitals signs and nursing note reviewed.  Constitutional:      Appearance: Normal appearance. He is well-developed. He is obese. He is not ill-appearing.  HENT:     Head: Normocephalic and atraumatic.     Mouth/Throat:     Mouth: Mucous membranes are moist.  Eyes:     Pupils: Pupils are equal, round, and reactive to light.  Neck:     Musculoskeletal: Neck supple.  Cardiovascular:     Rate and Rhythm: Normal rate and regular rhythm.     Heart sounds: Normal heart sounds.     Comments: Defibrillator left upper chest Pulmonary:     Effort: Pulmonary effort is normal. No respiratory distress.     Breath sounds: Normal breath sounds. No wheezing.  Abdominal:     General: Bowel sounds are normal.     Palpations: Abdomen is soft.     Tenderness: There is no abdominal tenderness. There is no rebound.     Comments: Abdomen is soft and nontender, normal bowel sounds  Musculoskeletal:     Right lower leg: No edema.     Left lower leg: No edema.  Lymphadenopathy:     Cervical: No cervical adenopathy.  Skin:    General: Skin is warm and dry.  Neurological:     Mental Status: He is alert and oriented to person, place, and time.  Psychiatric:        Mood and Affect: Mood normal.      ED Treatments / Results  Labs (all labs ordered are listed, but only abnormal results are displayed) Labs Reviewed - No data to display  EKG None  Radiology Dg Abdomen 1 View  Result Date: 12/19/2018 CLINICAL DATA:  Abdominal pain and constipation. EXAM: ABDOMEN - 1 VIEW COMPARISON:  None. FINDINGS: Bowel gas is seen in the nondependent portion  of the colon which is nondilated. A few gas-filled nondilated small bowel loops are seen in  the central abdomen. No evidence of dilated bowel loops. No radiopaque calculi identified. IMPRESSION: Nonobstructive bowel gas pattern.  No acute findings Electronically Signed   By: Danae Orleans M.D.   On: 12/19/2018 06:12    Procedures Procedures (including critical care time)  Medications Ordered in ED Medications - No data to display   Initial Impression / Assessment and Plan / ED Course  I have reviewed the triage vital signs and the nursing notes.  Pertinent labs & imaging results that were available during my care of the patient were reviewed by me and considered in my medical decision making (see chart for details).        Patient presents with constipation abdominal cramping.  No other additional symptoms.  He is overall nontoxic-appearing and vital signs are reassuring.  He is obese which limits his exam somewhat but his abdomen is soft and overall nontender.  He has normal bowel sounds.  I did obtain a KUB that does not show any evidence of dilated loops of bowel or free air.  Low suspicion at this time for obstruction as patient does not have any abdominal surgical history and is not having any nausea or vomiting.  Given that he is well-appearing and has a benign exam, will increase bowel regimen.  We discussed GoLYTELY as well as enemas as an option.  Patient is agreeable to plan.  He was given strict return precautions.  After history, exam, and medical workup I feel the patient has been appropriately medically screened and is safe for discharge home. Pertinent diagnoses were discussed with the patient. Patient was given return precautions.   Final Clinical Impressions(s) / ED Diagnoses   Final diagnoses:  Constipation, unspecified constipation type    ED Discharge Orders         Ordered    polyethylene glycol (GOLYTELY) 236 g solution     12/19/18 0627    sodium phosphate  (FLEET) 7-19 GM/118ML ENEM  Daily PRN     12/19/18 8891           Shon Baton, MD 12/19/18 [phone]

## 2018-12-19 NOTE — ED Notes (Signed)
Ice water provided.

## 2018-12-20 DIAGNOSIS — M25561 Pain in right knee: Secondary | ICD-10-CM | POA: Diagnosis present

## 2018-12-20 DIAGNOSIS — I471 Supraventricular tachycardia: Secondary | ICD-10-CM | POA: Diagnosis not present

## 2018-12-20 DIAGNOSIS — K5792 Diverticulitis of intestine, part unspecified, without perforation or abscess without bleeding: Secondary | ICD-10-CM | POA: Diagnosis not present

## 2018-12-20 DIAGNOSIS — I428 Other cardiomyopathies: Secondary | ICD-10-CM | POA: Diagnosis present

## 2018-12-20 DIAGNOSIS — G894 Chronic pain syndrome: Secondary | ICD-10-CM | POA: Diagnosis present

## 2018-12-20 DIAGNOSIS — K631 Perforation of intestine (nontraumatic): Secondary | ICD-10-CM | POA: Diagnosis not present

## 2018-12-20 DIAGNOSIS — K5900 Constipation, unspecified: Secondary | ICD-10-CM | POA: Diagnosis not present

## 2018-12-20 DIAGNOSIS — I480 Paroxysmal atrial fibrillation: Secondary | ICD-10-CM | POA: Diagnosis not present

## 2018-12-20 DIAGNOSIS — R188 Other ascites: Secondary | ICD-10-CM | POA: Diagnosis not present

## 2018-12-20 DIAGNOSIS — K5720 Diverticulitis of large intestine with perforation and abscess without bleeding: Secondary | ICD-10-CM | POA: Diagnosis present

## 2018-12-20 DIAGNOSIS — I5043 Acute on chronic combined systolic (congestive) and diastolic (congestive) heart failure: Secondary | ICD-10-CM | POA: Diagnosis present

## 2018-12-20 DIAGNOSIS — D6862 Lupus anticoagulant syndrome: Secondary | ICD-10-CM | POA: Diagnosis present

## 2018-12-20 DIAGNOSIS — K56699 Other intestinal obstruction unspecified as to partial versus complete obstruction: Secondary | ICD-10-CM | POA: Diagnosis present

## 2018-12-20 DIAGNOSIS — K56609 Unspecified intestinal obstruction, unspecified as to partial versus complete obstruction: Secondary | ICD-10-CM | POA: Diagnosis not present

## 2018-12-20 DIAGNOSIS — R1031 Right lower quadrant pain: Secondary | ICD-10-CM | POA: Diagnosis present

## 2018-12-20 DIAGNOSIS — I5022 Chronic systolic (congestive) heart failure: Secondary | ICD-10-CM | POA: Diagnosis not present

## 2018-12-20 DIAGNOSIS — N179 Acute kidney failure, unspecified: Secondary | ICD-10-CM | POA: Diagnosis present

## 2018-12-20 DIAGNOSIS — D62 Acute posthemorrhagic anemia: Secondary | ICD-10-CM | POA: Diagnosis not present

## 2018-12-20 DIAGNOSIS — K56691 Other complete intestinal obstruction: Secondary | ICD-10-CM | POA: Diagnosis not present

## 2018-12-20 DIAGNOSIS — R6521 Severe sepsis with septic shock: Secondary | ICD-10-CM | POA: Diagnosis not present

## 2018-12-20 DIAGNOSIS — Z01810 Encounter for preprocedural cardiovascular examination: Secondary | ICD-10-CM | POA: Diagnosis not present

## 2018-12-20 DIAGNOSIS — T8132XA Disruption of internal operation (surgical) wound, not elsewhere classified, initial encounter: Secondary | ICD-10-CM | POA: Diagnosis not present

## 2018-12-20 DIAGNOSIS — I5023 Acute on chronic systolic (congestive) heart failure: Secondary | ICD-10-CM | POA: Diagnosis not present

## 2018-12-20 DIAGNOSIS — K659 Peritonitis, unspecified: Secondary | ICD-10-CM | POA: Diagnosis not present

## 2018-12-20 DIAGNOSIS — I472 Ventricular tachycardia: Secondary | ICD-10-CM | POA: Diagnosis not present

## 2018-12-20 DIAGNOSIS — R195 Other fecal abnormalities: Secondary | ICD-10-CM | POA: Diagnosis not present

## 2018-12-20 DIAGNOSIS — I9589 Other hypotension: Secondary | ICD-10-CM | POA: Diagnosis not present

## 2018-12-20 DIAGNOSIS — A419 Sepsis, unspecified organism: Secondary | ICD-10-CM | POA: Diagnosis not present

## 2018-12-20 DIAGNOSIS — Z20822 Contact with and (suspected) exposure to covid-19: Secondary | ICD-10-CM | POA: Diagnosis present

## 2018-12-20 DIAGNOSIS — Z86718 Personal history of other venous thrombosis and embolism: Secondary | ICD-10-CM | POA: Diagnosis not present

## 2018-12-20 DIAGNOSIS — R935 Abnormal findings on diagnostic imaging of other abdominal regions, including retroperitoneum: Secondary | ICD-10-CM | POA: Diagnosis not present

## 2018-12-20 DIAGNOSIS — K5939 Other megacolon: Secondary | ICD-10-CM | POA: Diagnosis present

## 2018-12-20 DIAGNOSIS — Z23 Encounter for immunization: Secondary | ICD-10-CM | POA: Diagnosis not present

## 2018-12-20 DIAGNOSIS — K5931 Toxic megacolon: Secondary | ICD-10-CM | POA: Diagnosis not present

## 2018-12-20 DIAGNOSIS — Z6841 Body Mass Index (BMI) 40.0 and over, adult: Secondary | ICD-10-CM | POA: Diagnosis not present

## 2018-12-20 DIAGNOSIS — K5730 Diverticulosis of large intestine without perforation or abscess without bleeding: Secondary | ICD-10-CM | POA: Diagnosis not present

## 2018-12-20 DIAGNOSIS — J95821 Acute postprocedural respiratory failure: Secondary | ICD-10-CM | POA: Diagnosis not present

## 2018-12-20 DIAGNOSIS — K651 Peritoneal abscess: Secondary | ICD-10-CM | POA: Diagnosis not present

## 2018-12-20 LAB — CBC WITH DIFFERENTIAL/PLATELET
Abs Immature Granulocytes: 0.09 10*3/uL — ABNORMAL HIGH (ref 0.00–0.07)
Basophils Absolute: 0 10*3/uL (ref 0.0–0.1)
Basophils Relative: 0 %
Eosinophils Absolute: 0 10*3/uL (ref 0.0–0.5)
Eosinophils Relative: 0 %
HCT: 46.5 % (ref 39.0–52.0)
Hemoglobin: 14.7 g/dL (ref 13.0–17.0)
Immature Granulocytes: 1 %
Lymphocytes Relative: 7 %
Lymphs Abs: 1.2 10*3/uL (ref 0.7–4.0)
MCH: 28.9 pg (ref 26.0–34.0)
MCHC: 31.6 g/dL (ref 30.0–36.0)
MCV: 91.5 fL (ref 80.0–100.0)
Monocytes Absolute: 1.4 10*3/uL — ABNORMAL HIGH (ref 0.1–1.0)
Monocytes Relative: 9 %
Neutro Abs: 13.8 10*3/uL — ABNORMAL HIGH (ref 1.7–7.7)
Neutrophils Relative %: 83 %
Platelets: 182 10*3/uL (ref 150–400)
RBC: 5.08 MIL/uL (ref 4.22–5.81)
RDW: 13.4 % (ref 11.5–15.5)
WBC: 16.6 10*3/uL — ABNORMAL HIGH (ref 4.0–10.5)
nRBC: 0 % (ref 0.0–0.2)

## 2018-12-20 LAB — BASIC METABOLIC PANEL
Anion gap: 11 (ref 5–15)
BUN: 20 mg/dL (ref 6–20)
CO2: 28 mmol/L (ref 22–32)
Calcium: 8.2 mg/dL — ABNORMAL LOW (ref 8.9–10.3)
Chloride: 102 mmol/L (ref 98–111)
Creatinine, Ser: 1.52 mg/dL — ABNORMAL HIGH (ref 0.61–1.24)
GFR calc Af Amer: 60 mL/min — ABNORMAL LOW (ref >60)
GFR calc non Af Amer: 52 mL/min — ABNORMAL LOW (ref >60)
Glucose, Bld: 131 mg/dL — ABNORMAL HIGH (ref 70–99)
Potassium: 3.7 mmol/L (ref 3.5–5.1)
Sodium: 141 mmol/L (ref 135–145)

## 2018-12-20 LAB — SARS CORONAVIRUS 2 (TAT 6-24 HRS): SARS Coronavirus 2: NEGATIVE

## 2018-12-20 MED ORDER — ONDANSETRON HCL 4 MG PO TABS: 4.0000 mg | ORAL_TABLET | Freq: Four times a day (QID) | ORAL | Status: DC | PRN

## 2018-12-20 MED ORDER — FENTANYL CITRATE (PF) 100 MCG/2ML IJ SOLN
50.0000 ug | Freq: Once | INTRAMUSCULAR | Status: AC
  Administered 2018-12-20: 10:00:00 50 ug via INTRAVENOUS
  Filled 2018-12-20: qty 2

## 2018-12-20 MED ORDER — ONDANSETRON HCL 4 MG/2ML IJ SOLN
4.0000 mg | Freq: Four times a day (QID) | INTRAMUSCULAR | Status: DC | PRN
  Administered 2018-12-22 – 2018-12-28 (×4): 4 mg via INTRAVENOUS
  Filled 2018-12-20 (×3): qty 2

## 2018-12-20 MED ORDER — SACUBITRIL-VALSARTAN 49-51 MG PO TABS
1.0000 | ORAL_TABLET | Freq: Two times a day (BID) | ORAL | Status: DC
  Filled 2018-12-20: qty 1

## 2018-12-20 MED ORDER — PIPERACILLIN-TAZOBACTAM 3.375 G IVPB 30 MIN
3.3750 g | Freq: Four times a day (QID) | INTRAVENOUS | Status: DC
  Administered 2018-12-20 (×2): 3.375 g via INTRAVENOUS
  Filled 2018-12-20 (×5): qty 50

## 2018-12-20 MED ORDER — TORSEMIDE 20 MG PO TABS
40.0000 mg | ORAL_TABLET | Freq: Two times a day (BID) | ORAL | Status: DC
  Administered 2018-12-20 – 2018-12-22 (×5): 40 mg via ORAL
  Filled 2018-12-20 (×6): qty 2

## 2018-12-20 MED ORDER — OXYCODONE-ACETAMINOPHEN 5-325 MG PO TABS
1.0000 | ORAL_TABLET | ORAL | Status: DC | PRN
  Administered 2018-12-20 – 2018-12-21 (×5): 1 via ORAL
  Filled 2018-12-20 (×6): qty 1

## 2018-12-20 MED ORDER — DIGOXIN 125 MCG PO TABS
0.1250 mg | ORAL_TABLET | Freq: Every day | ORAL | Status: DC
  Administered 2018-12-20 – 2018-12-27 (×8): 0.125 mg via ORAL
  Filled 2018-12-20 (×9): qty 1

## 2018-12-20 MED ORDER — APIXABAN 5 MG PO TABS
5.0000 mg | ORAL_TABLET | Freq: Two times a day (BID) | ORAL | Status: DC
  Administered 2018-12-20 – 2018-12-22 (×5): 5 mg via ORAL
  Filled 2018-12-20: qty 2
  Filled 2018-12-20 (×4): qty 1

## 2018-12-20 MED ORDER — SPIRONOLACTONE 25 MG PO TABS
25.0000 mg | ORAL_TABLET | Freq: Every day | ORAL | Status: DC
  Administered 2018-12-20 – 2018-12-22 (×3): 25 mg via ORAL
  Filled 2018-12-20 (×4): qty 1

## 2018-12-20 MED ORDER — DEXTROSE-NACL 5-0.45 % IV SOLN: INTRAVENOUS | Status: DC

## 2018-12-20 MED ORDER — MORPHINE SULFATE (PF) 4 MG/ML IV SOLN
4.0000 mg | Freq: Once | INTRAVENOUS | Status: AC
  Administered 2018-12-20: 4 mg via INTRAVENOUS
  Filled 2018-12-20 (×2): qty 1

## 2018-12-20 MED ORDER — PIPERACILLIN-TAZOBACTAM 3.375 G IVPB
3.3750 g | Freq: Three times a day (TID) | INTRAVENOUS | Status: AC
  Administered 2018-12-20 – 2019-01-09 (×60): 3.375 g via INTRAVENOUS
  Filled 2018-12-20 (×59): qty 50

## 2018-12-20 MED ORDER — SODIUM CHLORIDE 0.9 % IV SOLN
INTRAVENOUS | Status: DC | PRN
  Administered 2018-12-20: 12:00:00 250 mL via INTRAVENOUS
  Administered 2019-01-09: 500 mL via INTRAVENOUS

## 2018-12-20 MED ORDER — AMIODARONE HCL 200 MG PO TABS
200.0000 mg | ORAL_TABLET | Freq: Every day | ORAL | Status: DC
  Administered 2018-12-20 – 2018-12-27 (×8): 200 mg via ORAL
  Filled 2018-12-20 (×9): qty 1

## 2018-12-20 NOTE — ED Notes (Signed)
ED Provider at bedside. 

## 2018-12-20 NOTE — ED Provider Notes (Signed)
6:30 AM  Patient currently boarding in the ED.  Last dose of Zosyn was at 11:30 PM last night.  Clinically stable and rested overnight without any issue.  Will repeat Zosyn.  Given CT scan, patient likely needs GI evaluation for colonoscopy to rule out mass/stricture.  This could potentially be done as an outpatient.  On reassessment, he is endorsing some pain but states he feels "okay."  We will order additional pain medication.  Patient was advised of the holding status for bed.  He is clinically stable at this time.   [name], MD 12/20/18 [phone]

## 2018-12-20 NOTE — ED Provider Notes (Addendum)
Brief update note  Reason: Rounded on admitted patient  Summary: Patient boarding in ED after diagnosis of diverticulitis, significant leukocytosis.  Started on Zosyn with plan for admission to hospitalist service for further management.  CT scan also demonstrating finding concerning for mass versus stricture and patient needs colonoscopy could be done as outpatient if needed.  This morning patient states symptoms are somewhat improved but still having some pain.  Clinically stable.  Plan: Recheck CBC and BMP Ordered as needed pain medications Continue Zosyn Continue plan for hospitalist admission Reordered home medications (amiodarone, Eliquis, digoxin, spironolactone, torsemide), hold Entresto given borderline AKI   Lucrezia Starch, MD 12/20/18 1021    Lucrezia Starch, MD 12/20/18 1021    Lucrezia Starch, MD 12/20/18 [phone]

## 2018-12-20 NOTE — H&P (Addendum)
History and Physical   Roberto [name] HYQ:[id number] DOB: [birth year] DOA: 12/19/2018  Referring MD/NP/PA: Dr. Dina Rich  PCP: Roberto Lung, MD   Outpatient Specialists: Cardiology at [facility]   Patient coming from: Home  Chief Complaint: Right lower quadrant abdominal  HPI: Roberto [name] is a 53 y.o. male with medical history significant of chronic combined systolic and diastolic CHF, DVT, on chronic anticoagulation, nonischemic cardiomyopathy with a EF of 25 to 30%, history of pulmonary embolism, paroxysmal atrial fibrillation, lupus anticoagulant and morbid obesity who presented to [place] yesterday with right lower quadrant abdominal pain.  No melena no bright red blood per rectum no hematemesis.  Pain was at 7 out of 10 at the outset.  Patient was initiated on treatment.  He is seem to have responded but continues to have pain.  Patient also had a CT abdomen that is showing possible stricture versus mass.  He is deemed to need some type of colonoscopy soon.  Patient admitted to the hospital for treatment of his diverticulitis.  He still has pain at 4 out of 5.  He has been on bowel rest..  ED Course: Temperature 98 for blood pressure is 99/54 pulse 67 respiratory rate of 24 oxygen sat 93% room air.  Sodium 141 potassium 3.7 chloride 102 CO2 28 glucose 131 BUN 30 creatinine 1.52.  White count is 16.6 hemoglobin 14.7 platelets of 182.  Omental pelvis yesterday showed dilated fluid-filled colon from cecal tip to distal descending colon more findings of transition in caliber consistent with diverticulitis versus stricture or mass which are not excluded.  Patient is therefore being admitted for management  Review of Systems: As per HPI otherwise 10 point review of systems negative.    Past Medical History:  Diagnosis Date   Arthritis    Chronic combined systolic and diastolic CHF (congestive heart failure) (HCC)    Deep vein thrombosis (HCC)    Lupus anticoagulant  positive    Morbid obesity (HCC)    Mural thrombus of heart    coumadin   Nonischemic cardiomyopathy ([place])    a) 12/17/11 echo: LVEF 95-28%, grade 3 diastolic dysfunction (c/w restriction), mod MR, mod LA/LV and mild RA dilatation; b) 12/18/11 cMRI: LVEF 38%, mod LV/mild RV dilatation, global HK, mild-mod RV sys dysfxn, no LV thrombus & patchy non-subendocardial delayed enhancement c/w infil dz or prior myocarditis; c. 07/2018 EF 25-30%.   Paroxysmal SVT (supraventricular tachycardia) (HCC)    Pulmonary embolism (HCC)    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    Past Surgical History:  Procedure Laterality Date   A-FLUTTER ABLATION N/A 11/17/2016   Procedure: A-FLUTTER ABLATION;  Surgeon: Constance Haw, MD;  Location: [place] [facility];  Service: Cardiovascular;  Laterality: N/A;   CARDIAC CATHETERIZATION  06/2006   Angiographically normal cors   CARDIAC CATHETERIZATION  12/22/2011   R/LHC: normal cors, well-compensated HDs, LV dysfxn   CARDIOVERSION N/A 10/17/2016   Procedure: CARDIOVERSION;  Surgeon: Jolaine Artist, MD;  Location: [facility] ENDOSCOPY;  Service: Cardiovascular;  Laterality: N/A;   CARDIOVERSION N/A 03/20/2017   Procedure: CARDIOVERSION;  Surgeon: Jolaine Artist, MD;  Location: [place];  Service: Cardiovascular;  Laterality: N/A;   ICD IMPLANT N/A 09/03/2018   Procedure: ICD IMPLANT;  Surgeon: Constance Haw, MD;  Location: [place] [facility];  Service: Cardiovascular;  Laterality: N/A;   LEFT AND RIGHT HEART CATHETERIZATION WITH CORONARY ANGIOGRAM N/A 12/22/2011   Procedure: LEFT AND RIGHT  HEART CATHETERIZATION WITH CORONARY ANGIOGRAM;  Surgeon: Dolores Patty, MD;  Location: [facility] CATH LAB;  Service: Cardiovascular;  Laterality: N/A;   PATELLAR TENDON REPAIR     Left   RIGHT HEART CATH N/A 10/20/2016   Procedure: RIGHT HEART CATH;  Surgeon: Dolores Patty, MD;  Location: [facility] INVASIVE CV LAB;  Service: Cardiovascular;  Laterality: N/A;    TEE WITHOUT CARDIOVERSION N/A 03/20/2017   Procedure: TRANSESOPHAGEAL ECHOCARDIOGRAM (TEE);  Surgeon: Dolores Patty, MD;  Location: [facility] ENDOSCOPY;  Service: Cardiovascular;  Laterality: N/A;     reports that he has never smoked. He has never used smokeless tobacco. He reports that he does not drink alcohol or use drugs.  Allergies  Allergen Reactions   Shrimp [Shellfish Allergy] Hives and Itching    Family History  Problem Relation Age of Onset   Hypertension Mother    Clotting disorder Mother        mom with PEs   Cancer Mother        uterine cancer   Diabetes Father    Heart attack Father    Hypertension Other        Sibling   Diabetes Other    Obesity Other    Diabetes Sister    Obesity Sister    Obesity Sister    Colon cancer Neg Hx    Stomach cancer Neg Hx    Esophageal cancer Neg Hx    Rectal cancer Neg Hx    Liver cancer Neg Hx      Prior to Admission medications   Medication Sig Start Date End Date Taking? Authorizing Provider  acetaminophen (TYLENOL) 650 MG CR tablet Take 1,300 mg by mouth every 8 (eight) hours as needed for pain.    [provider]  amiodarone (PACERONE) 200 MG tablet Take 1 tablet (200 mg total) by mouth daily. 10/18/18   Bensimhon, Bevelyn Buckles, MD  coconut oil OIL Apply 1 application topically as needed (dry skin).     [provider]  digoxin (LANOXIN) 0.125 MG tablet TAKE 1 TABLET(0.125 MG) BY MOUTH DAILY 11/01/18   Bensimhon, Bevelyn Buckles, MD  ELIQUIS 5 MG TABS tablet TAKE 1 TABLET(5 MG) BY MOUTH TWICE DAILY Patient taking differently: Take 5 mg by mouth 2 (two) times daily.  03/22/18   Bensimhon, Bevelyn Buckles, MD  ENTRESTO 49-51 MG TAKE 1 TABLET BY MOUTH TWICE DAILY 09/15/18   Bensimhon, Bevelyn Buckles, MD  polyethylene glycol (GOLYTELY) 236 g solution Drink 8 ounces of GoLYTELY every 15 minutes .  Do not drink the gallon in <3 hours.  Drink entire prep. 12/19/18   Horton, Mayer Masker, MD  potassium chloride SA (KLOR-CON) 20  MEQ tablet Take 2 tablets (40 mEq total) by mouth 2 (two) times daily. 10/18/18   Bensimhon, Bevelyn Buckles, MD  sildenafil (VIAGRA) 100 MG tablet TAKE 1/2 TO 1 TABLET(50 TO 100 MG) BY MOUTH DAILY AS NEEDED FOR ERECTILE DYSFUNCTION Patient taking differently: Take 50-100 mg by mouth as needed for erectile dysfunction.  07/13/17   [name], MD  sodium phosphate (FLEET) 7-19 GM/118ML ENEM Place 133 mLs (1 enema total) rectally daily as needed for severe constipation. 12/19/18   Horton, Mayer Masker, MD  spironolactone (ALDACTONE) 25 MG tablet TAKE 1 TABLET(25 MG) BY MOUTH DAILY 12/03/18   [name], MD  torsemide (DEMADEX) 20 MG tablet TAKE 2 TABLETS BY MOUTH TWICE DAILY. STOP LASIX Patient taking differently: Take 40 mg by mouth 2 (two) times daily.  06/29/18   Bensimhon, Bevelyn Buckles, MD    Physical Exam: Vitals:   12/20/18 1448 12/20/18 1613 12/20/18 1830 12/20/18 2104  BP:  108/82 103/71 101/63  Pulse: 79 88 60 79  Resp: (!) 28 20 18 18   Temp:   99.5 F (37.5 C) 98.7 F (37.1 C)  TempSrc:   Oral Oral  SpO2: 94% 96% 94% 95%  Weight:      Height:          Constitutional: Acutely ill looking no distress Vitals:   12/20/18 1448 12/20/18 1613 12/20/18 1830 12/20/18 2104  BP:  108/82 103/71 101/63  Pulse: 79 88 60 79  Resp: (!) 28 20 18 18   Temp:   99.5 F (37.5 C) 98.7 F (37.1 C)  TempSrc:   Oral Oral  SpO2: 94% 96% 94% 95%  Weight:      Height:       Eyes: PERRL, lids and conjunctivae normal ENMT: Mucous membranes are moist. Posterior pharynx clear of any exudate or lesions.Normal dentition.  Neck: normal, supple, no masses, no thyromegaly Respiratory: clear to auscultation bilaterally, no wheezing, no crackles. Normal respiratory effort. No accessory muscle use.  Cardiovascular: Regular rate and rhythm, no murmurs / rubs / gallops. No extremity edema. 2+ pedal pulses. No carotid bruits.  Abdomen: Mid abdominal tenderness, no guarding no masses palpated. No hepatosplenomegaly.  Bowel sounds positive.  Musculoskeletal: no clubbing / cyanosis. No joint deformity upper and lower extremities. Good ROM, no contractures. Normal muscle tone.  Skin: no rashes, lesions, ulcers. No induration Neurologic: CN 2-12 grossly intact. Sensation intact, DTR normal. Strength 5/5 in all 4.  Psychiatric: Normal judgment and insight. Alert and oriented x 3. Normal mood.     Labs on Admission: I have personally reviewed following labs and imaging studies  CBC: Recent Labs  Lab 12/19/18 2038 12/20/18 1020  WBC 19.7* 16.6*  NEUTROABS 16.9* 13.8*  HGB 15.9 14.7  HCT 50.3 46.5  MCV 91.1 91.5  PLT 221 182   Basic Metabolic Panel: Recent Labs  Lab 12/19/18 2038 12/20/18 1020  NA 140 141  K 3.6 3.7  CL 101 102  CO2 28 28  GLUCOSE 122* 131*  BUN 17 20  CREATININE 1.42* 1.52*  CALCIUM 8.6* 8.2*   GFR: Estimated Creatinine Clearance: 80.6 mL/min (A) (by C-G formula based on SCr of 1.52 mg/dL (H)). Liver Function Tests: Recent Labs  Lab 12/19/18 2038  AST 21  ALT 13  ALKPHOS 45  BILITOT 0.9  PROT 7.7  ALBUMIN 3.2*   Recent Labs  Lab 12/19/18 2038  LIPASE 27   No results for input(s): AMMONIA in the last 168 hours. Coagulation Profile: No results for input(s): INR, PROTIME in the last 168 hours. Cardiac Enzymes: No results for input(s): CKTOTAL, CKMB, CKMBINDEX, TROPONINI in the last 168 hours. BNP (last 3 results) No results for input(s): PROBNP in the last 8760 hours. HbA1C: No results for input(s): HGBA1C in the last 72 hours. CBG: No results for input(s): GLUCAP in the last 168 hours. Lipid Profile: No results for input(s): CHOL, HDL, LDLCALC, TRIG, CHOLHDL, LDLDIRECT in the last 72 hours. Thyroid Function Tests: No results for input(s): TSH, T4TOTAL, FREET4, T3FREE, THYROIDAB in the last 72 hours. Anemia Panel: No results for input(s): VITAMINB12, FOLATE, FERRITIN, TIBC, IRON, RETICCTPCT in the last 72 hours. Urine analysis:    Component Value  Date/Time   COLORURINE YELLOW 12/19/2018 2038   APPEARANCEUR CLEAR 12/19/2018 2038   LABSPEC >1.030 (H) 12/19/2018 2038  LABSPEC 1.025 03/18/2017 1337   PHURINE 5.5 12/19/2018 2038   GLUCOSEU NEGATIVE 12/19/2018 2038   HGBUR MODERATE (A) 12/19/2018 2038   BILIRUBINUR SMALL (A) 12/19/2018 2038   BILIRUBINUR negative 03/18/2017 1337   BILIRUBINUR neg 12/17/2015 0944   KETONESUR NEGATIVE 12/19/2018 2038   PROTEINUR >300 (A) 12/19/2018 2038   UROBILINOGEN negative 12/17/2015 0944   UROBILINOGEN 0.2 12/16/2011 1208   NITRITE NEGATIVE 12/19/2018 2038   LEUKOCYTESUR NEGATIVE 12/19/2018 2038   Sepsis Labs: @LABRCNTIP (procalcitonin:4,lacticidven:4) ) Recent Results (from the past 240 hour(s))  SARS CORONAVIRUS 2 (TAT 6-24 HRS) Nasopharyngeal Nasopharyngeal Swab     Status: None   Collection Time: 12/19/18 11:18 PM   Specimen: Nasopharyngeal Swab  Result Value Ref Range Status   SARS Coronavirus 2 NEGATIVE NEGATIVE Final    Comment: (NOTE) SARS-CoV-2 target nucleic acids are NOT DETECTED. The SARS-CoV-2 RNA is generally detectable in upper and lower respiratory specimens during the acute phase of infection. Negative results do not preclude SARS-CoV-2 infection, do not rule out co-infections with other pathogens, and should not be used as the sole basis for treatment or other patient management decisions. Negative results must be combined with clinical observations, patient history, and epidemiological information. The expected result is Negative. Fact Sheet for Patients: HairSlick.nohttps://www.fda.gov/media/138098/download Fact Sheet for Healthcare Providers: quierodirigir.comhttps://www.fda.gov/media/138095/download This test is not yet approved or cleared by the [place] FDA and  has been authorized for detection and/or diagnosis of SARS-CoV-2 by FDA under an Emergency Use Authorization (EUA). This EUA will remain  in effect (meaning this test can be used) for the duration of the COVID-19 declaration  under Section 56 4(b)(1) of the Act, 21 U.S.C. section 360bbb-3(b)(1), unless the authorization is terminated or revoked sooner. Performed at [facility], [address] [place], [place] [address]      Radiological Exams on Admission: Dg Abdomen 1 View  Result Date: 12/19/2018 CLINICAL DATA:  Abdominal pain and constipation. EXAM: ABDOMEN - 1 VIEW COMPARISON:  None. FINDINGS: Bowel gas is seen in the nondependent portion of the colon which is nondilated. A few gas-filled nondilated small bowel loops are seen in the central abdomen. No evidence of dilated bowel loops. No radiopaque calculi identified. IMPRESSION: Nonobstructive bowel gas pattern.  No acute findings Electronically Signed   By: Danae OrleansJohn A Stahl M.D.   On: 12/19/2018 06:12   Ct Abdomen Pelvis W Contrast  Result Date: 12/19/2018 CLINICAL DATA:  RIGHT lower quadrant pain and constipation for 2 days question appendicitis, history CHF, non ischemic cardiomyopathy, pulmonary embolism, PSVT EXAM: CT ABDOMEN AND PELVIS WITH CONTRAST TECHNIQUE: Multidetector CT imaging of the abdomen and pelvis was performed using the standard protocol following bolus administration of intravenous contrast. Sagittal and coronal MPR images reconstructed from axial data set. CONTRAST:  100mL OMNIPAQUE IOHEXOL 300 MG/ML SOLN IV. No oral contrast. COMPARISON:  10/30/2008, 12/19/2005 FINDINGS: Lower chest: [name] bases clear.  AICD lead RIGHT ventricle. Hepatobiliary: Small low-attenuation foci within liver question cysts. Large mass lesion identified at the medial aspect of the RIGHT lobe, 4.4 x 3.7 cm image 14, previously 7.6 x 6.9 cm, shown to be a large hemangioma on an earlier exam of 12/19/2005 and now further decreased in size. Remainder of liver unremarkable. Gallbladder normal appearance. Pancreas: Atrophic pancreas without mass Spleen: Normal appearance.  Tiny splenule anterior to spleen stable. Adrenals/Urinary Tract: Adrenal glands normal appearance.  Small cortical scar at lateral aspect inferior pole RIGHT kidney. Kidneys ureters and bladder otherwise normal appearance. Stomach/Bowel: Normal appendix. Stomach decompressed.  Small bowel loops unremarkable, decompressed. Distended fluid-filled colon from tip of cecum to distal descending colon. Abrupt change in caliber to normal caliber at the distal descending colon. Few sigmoid diverticula. Minimal infiltrative change at the segment of caliber transition. It is uncertain whether this represents an active inflammatory process such as subtle diverticulitis, colonic stricture, or mass. Vascular/Lymphatic: Aorta normal caliber. Vascular structures grossly patent. No adenopathy. Reproductive: Unremarkable prostate gland and seminal vesicles Other: No free air or free fluid.  No hernia. Musculoskeletal: Osseous structures unremarkable. IMPRESSION: Dilated fluid-filled colon from cecal tip to distal descending colon more an abrupt transition in caliber is identified, associated with minimal pericolic infiltrative changes. This could represent subtle diverticulitis though stricture and mass are not excluded. Follow-up colonoscopy recommended to exclude neoplasm. Normal appendix. Decrease in size of known hepatic hemangioma since 2010. Electronically Signed   By: Ulyses SouthwardMark  Boles M.D.   On: 12/19/2018 22:11     Assessment/Plan Principal Problem:   Acute diverticulitis Active Problems:   Obesity, morbid (HCC)   Nonischemic cardiomyopathy (HCC)   Paroxysmal SVT (supraventricular tachycardia) (HCC)   Chronic systolic heart failure (HCC)   Chronic pain of right knee   History of DVT (deep vein thrombosis)   PAF (paroxysmal atrial fibrillation) (HCC)     #1 acute diverticulitis: Patient will be admitted to the hospital.  Continue with IV Zosyn.  Clear liquid diet.  Pain management and nausea management.  Supportive care mainly.  Patient will need colonoscopy soon especially with suspected stricture versus colon  mass.  #2 nonischemic cardiomyopathy: Patient follows up with cardiology.  Continue with cardiac medications.  #3 paroxysmal A. fib: Continue anticoagulation.  #4 morbid obesity: Dietary counseling.  #5 chronic pain syndrome: Involving the knee.  Continue home regimen.     DVT prophylaxis: SCD Code Status: Full code Family Communication: No family at bedside Disposition Plan: Home Consults called: None Admission status: Inpatient  Severity of Illness: The appropriate patient status for this patient is INPATIENT. Inpatient status is judged to be reasonable and necessary in order to provide the required intensity of service to ensure the patient's safety. [name] presenting symptoms, physical exam findings, and initial radiographic and laboratory data in the context of their chronic comorbidities is felt to place them at high risk for further clinical deterioration. Furthermore, it is not anticipated that the patient will be medically stable for discharge from the hospital within 2 midnights of admission. The following factors support the patient status of inpatient.   " [name] presenting symptoms include abdominal pain. " The worrisome physical exam findings include abdominal tenderness. " The initial radiographic and laboratory data are worrisome because of CT findings of diverticulitis. " The chronic co-morbidities include systolic dysfunction seen.   * I certify that at the point of admission it is my clinical judgment that the patient will require inpatient hospital care spanning beyond 2 midnights from the point of admission due to high intensity of service, high risk for further deterioration and high frequency of surveillance required.Lonia Blood*    Roi Jafari,LAWAL MD [facility] Pager [phone]  If 7PM-7AM, please contact night-coverage [email or web address] Password [email or web address]  12/20/2018, 11:43 PM

## 2018-12-20 NOTE — ED Notes (Signed)
Report given to [facility]. 

## 2018-12-21 ENCOUNTER — Encounter (HOSPITAL_COMMUNITY): Payer: Self-pay | Admitting: General Practice

## 2018-12-21 DIAGNOSIS — K5730 Diverticulosis of large intestine without perforation or abscess without bleeding: Secondary | ICD-10-CM

## 2018-12-21 DIAGNOSIS — K5931 Toxic megacolon: Secondary | ICD-10-CM

## 2018-12-21 DIAGNOSIS — R195 Other fecal abnormalities: Secondary | ICD-10-CM

## 2018-12-21 DIAGNOSIS — M25561 Pain in right knee: Secondary | ICD-10-CM

## 2018-12-21 DIAGNOSIS — R935 Abnormal findings on diagnostic imaging of other abdominal regions, including retroperitoneum: Secondary | ICD-10-CM

## 2018-12-21 DIAGNOSIS — K5900 Constipation, unspecified: Secondary | ICD-10-CM

## 2018-12-21 DIAGNOSIS — G8929 Other chronic pain: Secondary | ICD-10-CM

## 2018-12-21 LAB — CBC
HCT: 45.7 % (ref 39.0–52.0)
Hemoglobin: 14.8 g/dL (ref 13.0–17.0)
MCH: 29.2 pg (ref 26.0–34.0)
MCHC: 32.4 g/dL (ref 30.0–36.0)
MCV: 90.3 fL (ref 80.0–100.0)
Platelets: 178 10*3/uL (ref 150–400)
RBC: 5.06 MIL/uL (ref 4.22–5.81)
RDW: 13.4 % (ref 11.5–15.5)
WBC: 16.2 10*3/uL — ABNORMAL HIGH (ref 4.0–10.5)
nRBC: 0 % (ref 0.0–0.2)

## 2018-12-21 LAB — COMPREHENSIVE METABOLIC PANEL
ALT: 12 U/L (ref 0–44)
AST: 18 U/L (ref 15–41)
Albumin: 2.7 g/dL — ABNORMAL LOW (ref 3.5–5.0)
Alkaline Phosphatase: 38 U/L (ref 38–126)
Anion gap: 13 (ref 5–15)
BUN: 16 mg/dL (ref 6–20)
CO2: 28 mmol/L (ref 22–32)
Calcium: 8 mg/dL — ABNORMAL LOW (ref 8.9–10.3)
Chloride: 99 mmol/L (ref 98–111)
Creatinine, Ser: 1.41 mg/dL — ABNORMAL HIGH (ref 0.61–1.24)
GFR calc Af Amer: >60 mL/min (ref >60)
GFR calc non Af Amer: 56 mL/min — ABNORMAL LOW (ref >60)
Glucose, Bld: 107 mg/dL — ABNORMAL HIGH (ref 70–99)
Potassium: 3.7 mmol/L (ref 3.5–5.1)
Sodium: 140 mmol/L (ref 135–145)
Total Bilirubin: 0.8 mg/dL (ref 0.3–1.2)
Total Protein: 6.6 g/dL (ref 6.5–8.1)

## 2018-12-21 LAB — HIV ANTIBODY (ROUTINE TESTING W REFLEX): HIV Screen 4th Generation wRfx: NONREACTIVE

## 2018-12-21 MED ORDER — OXYCODONE-ACETAMINOPHEN 5-325 MG PO TABS
1.0000 | ORAL_TABLET | ORAL | Status: DC | PRN
  Administered 2018-12-21 (×2): 1 via ORAL
  Administered 2018-12-22: 2 via ORAL
  Administered 2018-12-22 – 2018-12-23 (×5): 1 via ORAL
  Filled 2018-12-21 (×3): qty 2
  Filled 2018-12-21 (×2): qty 1
  Filled 2018-12-21: qty 2
  Filled 2018-12-21 (×2): qty 1

## 2018-12-21 MED ORDER — INFLUENZA VAC SPLIT QUAD 0.5 ML IM SUSY
0.5000 mL | PREFILLED_SYRINGE | INTRAMUSCULAR | Status: AC
  Administered 2018-12-25: 0.5 mL via INTRAMUSCULAR
  Filled 2018-12-21: qty 0.5

## 2018-12-21 NOTE — Progress Notes (Signed)
Pharmacy Antibiotic Note  Roberto [name] is a 53 y.o. male admitted on 12/19/2018 with abdominal pain.  Pharmacy has b3een consulted for Zosyn dosing. WBC elevated. Noted renal dysfunction.   Plan: Zosyn 3.375G IV q8h to be infused over 4 hours Trend WBC, temp, renal function  F/U infectious work-up   Height: 5\' 8"  (172.7 cm) Weight: (!) 333 lb (151 kg) IBW/kg (Calculated) : 68.4  Temp (24hrs), Avg:99.1 F (37.3 C), Min:98.7 F (37.1 C), Max:99.5 F (37.5 C)  Recent Labs  Lab 12/19/18 2038 12/20/18 1020  WBC 19.7* 16.6*  CREATININE 1.42* 1.52*    Estimated Creatinine Clearance: 80.6 mL/min (A) (by C-G formula based on SCr of 1.52 mg/dL (H)).    Allergies  Allergen Reactions  . Shrimp [Shellfish Allergy] Hives and Itching    Narda Bonds, PharmD, BCPS Clinical Pharmacist Phone: [phone]

## 2018-12-21 NOTE — Progress Notes (Signed)
PROGRESS NOTE  NORFLEET CAPERS  TMH:[id number] DOB: Jun 07, 1965 DOA: 12/19/2018 PCP: Denita Lung, MD   Brief Narrative: Roberto [name] is a 53 y.o. male with a history of combined CHF, NICM, DVT/PE, LV mural thrombus, lupus anticoagulant on chronic anticoagulation, PAF/atrial flutter s/p ablation and morbid obesity who presented to [facility] due to constipation x6 days and RLQ abdominal pain x2 days that persisted despite taking multiple laxatives including miralax bowel prep prescribed earlier that day by the EDP. He was afebrile with RLQ tenderness and leukocytosis (WBC 19.7k). CT abd/pelvis demonstrated a distended fluid-filled colon from tip of cecum to distal descending colon. Abrupt change in caliber to normal caliber at the distal descending colon. Few sigmoid diverticula. Minimal infiltrative change at the segment of caliber transition. It is uncertain whether this represents an active inflammatory process such as subtle diverticulitis, colonic stricture, or mass. Zosyn, pain medications, and bowel rest started and patient was admitted to [facility]. GI consult pending.  Assessment & Plan: Principal Problem:   Acute diverticulitis Active Problems:   Obesity, morbid (HCC)   Nonischemic cardiomyopathy (HCC)   Paroxysmal SVT (supraventricular tachycardia) (HCC)   Chronic systolic heart failure (HCC)   Chronic pain of right knee   History of DVT (deep vein thrombosis)   PAF (paroxysmal atrial fibrillation) ([place])  Acute diverticulitis: Working diagnosis for patient. First episode. Pain was preceded by constipation x4-5 days and pain is atypically in RLQ. CT findings inconclusive with possible stricture vs. mass. He's had no constitutional symptoms consistent with malignancy, has never had colonoscopy. Last seen 2017 by Dr. Henrene Pastor where cologuard was ordered but he was lost to follow up. - Continue clear liquids for now - Continue zosyn - Continue pain management as ordered. - GI consulted.    ?Colonic stricture vs. mass and constipation: Possibly attributable to #1. Not resolved with multiple outpatient medications and bowel prep prescribed by EDP 12/7.  - GI consulted  NICM, chronic combined HFrEF, atrial flutter s/p ablation 2018: Appears roughly euvolemic. Echo July 2020 w/LVEF 25-30% s/p AICD Aug 2020 - Continue home torsemide, spironolactone, digoxin, amiodarone, eliquis.  - Holding entresto for now - Strict I/O, daily weights  AKI: Cr up to 1.4-1.5 from putative baseline 1.1-1.2. Kidneys, ureters, and bladder appeared normal on CT. - Hold entresto. Given severity of cardiomyopathy, will continue diuretics.   Coagulopathy, lupus anticoagulant, history of DVT, PE, LV mural thrombus:  - Continue lifelong anticoagulation. Would need bridging were eliquis to be held peri-procedurally.   Morbid obesity: BMI 50.  - Weight loss recommended long term.   DVT prophylaxis: Eliquis Code Status: Full Family Communication: None at bedside Disposition Plan: Uncertain, pending GI work up.  Consultants:    GI  Procedures:   None  Antimicrobials:  Zosyn 12/7 >>    Subjective: Abdominal cramping improved to moderate when getting percocet, not ever completely gone. Located in RLQ, constant, nonradiating. No sedation. No BM, and denies flatus this AM.  Objective: Vitals:   12/20/18 1613 12/20/18 1830 12/20/18 2104 12/21/18 0424  BP: 108/82 103/71 101/63 104/78  Pulse: 88 60 79 74  Resp: 20 18 18 16   Temp:  99.5 F (37.5 C) 98.7 F (37.1 C) 99 F (37.2 C)  TempSrc:  Oral Oral Oral  SpO2: 96% 94% 95% 95%  Weight:      Height:        Intake/Output Summary (Last 24 hours) at 12/21/2018 1028 Last data filed at 12/21/2018 0604 Gross per 24 hour  Intake  647.09 ml  Output 775 ml  Net -127.91 ml   Filed Weights   12/19/18 1957  Weight: (!) 151 kg    Gen: 53 y.o. male in no distress  Pulm: Non-labored breathing room air. Clear to auscultation bilaterally.   CV: Regular rate and rhythm. No murmur, rub, or gallop. No JVD, Trace woody pedal edema at baseline per pt. GI: Abdomen soft, obese, tender in RLQ, non-tender in LLQ and suprapubic, somewhat distended, with hypooactive bowel sounds. No organomegaly or masses felt. Ext: Warm, no deformities Skin: No rashes, lesions or ulcers Neuro: Alert and oriented. No focal neurological deficits. Psych: Judgement and insight appear normal. Mood & affect appropriate.   Data Reviewed: I have personally reviewed following labs and imaging studies  CBC: Recent Labs  Lab 12/19/18 2038 12/20/18 1020 12/21/18 0041  WBC 19.7* 16.6* 16.2*  NEUTROABS 16.9* 13.8*  --   HGB 15.9 14.7 14.8  HCT 50.3 46.5 45.7  MCV 91.1 91.5 90.3  PLT 221 182 178   Basic Metabolic Panel: Recent Labs  Lab 12/19/18 2038 12/20/18 1020 12/21/18 0041  NA 140 141 140  K 3.6 3.7 3.7  CL 101 102 99  CO2 28 28 28   GLUCOSE 122* 131* 107*  BUN 17 20 16   CREATININE 1.42* 1.52* 1.41*  CALCIUM 8.6* 8.2* 8.0*   GFR: Estimated Creatinine Clearance: 86.9 mL/min (A) (by C-G formula based on SCr of 1.41 mg/dL (H)). Liver Function Tests: Recent Labs  Lab 12/19/18 2038 12/21/18 0041  AST 21 18  ALT 13 12  ALKPHOS 45 38  BILITOT 0.9 0.8  PROT 7.7 6.6  ALBUMIN 3.2* 2.7*   Recent Labs  Lab 12/19/18 2038  LIPASE 27   No results for input(s): AMMONIA in the last 168 hours. Coagulation Profile: No results for input(s): INR, PROTIME in the last 168 hours. Cardiac Enzymes: No results for input(s): CKTOTAL, CKMB, CKMBINDEX, TROPONINI in the last 168 hours. BNP (last 3 results) No results for input(s): PROBNP in the last 8760 hours. HbA1C: No results for input(s): HGBA1C in the last 72 hours. CBG: No results for input(s): GLUCAP in the last 168 hours. Lipid Profile: No results for input(s): CHOL, HDL, LDLCALC, TRIG, CHOLHDL, LDLDIRECT in the last 72 hours. Thyroid Function Tests: No results for input(s): TSH, T4TOTAL,  FREET4, T3FREE, THYROIDAB in the last 72 hours. Anemia Panel: No results for input(s): VITAMINB12, FOLATE, FERRITIN, TIBC, IRON, RETICCTPCT in the last 72 hours. Urine analysis:    Component Value Date/Time   COLORURINE YELLOW 12/19/2018 2038   APPEARANCEUR CLEAR 12/19/2018 2038   LABSPEC >1.030 (H) 12/19/2018 2038   LABSPEC 1.025 03/18/2017 1337   PHURINE 5.5 12/19/2018 2038   GLUCOSEU NEGATIVE 12/19/2018 2038   HGBUR MODERATE (A) 12/19/2018 2038   BILIRUBINUR SMALL (A) 12/19/2018 2038   BILIRUBINUR negative 03/18/2017 1337   BILIRUBINUR neg 12/17/2015 0944   KETONESUR NEGATIVE 12/19/2018 2038   PROTEINUR >300 (A) 12/19/2018 2038   UROBILINOGEN negative 12/17/2015 0944   UROBILINOGEN 0.2 12/16/2011 1208   NITRITE NEGATIVE 12/19/2018 2038   LEUKOCYTESUR NEGATIVE 12/19/2018 2038   Recent Results (from the past 240 hour(s))  SARS CORONAVIRUS 2 (TAT 6-24 HRS) Nasopharyngeal Nasopharyngeal Swab     Status: None   Collection Time: 12/19/18 11:18 PM   Specimen: Nasopharyngeal Swab  Result Value Ref Range Status   SARS Coronavirus 2 NEGATIVE NEGATIVE Final    Comment: (NOTE) SARS-CoV-2 target nucleic acids are NOT DETECTED. The SARS-CoV-2 RNA is generally detectable in upper  and lower respiratory specimens during the acute phase of infection. Negative results do not preclude SARS-CoV-2 infection, do not rule out co-infections with other pathogens, and should not be used as the sole basis for treatment or other patient management decisions. Negative results must be combined with clinical observations, patient history, and epidemiological information. The expected result is Negative. Fact Sheet for Patients: HairSlick.no Fact Sheet for Healthcare Providers: quierodirigir.com This test is not yet approved or cleared by the [place] FDA and  has been authorized for detection and/or diagnosis of SARS-CoV-2 by FDA under an  Emergency Use Authorization (EUA). This EUA will remain  in effect (meaning this test can be used) for the duration of the COVID-19 declaration under Section 56 4(b)(1) of the Act, 21 U.S.C. section 360bbb-3(b)(1), unless the authorization is terminated or revoked sooner. Performed at [facility], [address], [place], [place] [address]       Radiology Studies: Ct Abdomen Pelvis W Contrast  Result Date: 12/19/2018 CLINICAL DATA:  RIGHT lower quadrant pain and constipation for 2 days question appendicitis, history CHF, non ischemic cardiomyopathy, pulmonary embolism, PSVT EXAM: CT ABDOMEN AND PELVIS WITH CONTRAST TECHNIQUE: Multidetector CT imaging of the abdomen and pelvis was performed using the standard protocol following bolus administration of intravenous contrast. Sagittal and coronal MPR images reconstructed from axial data set. CONTRAST:  OMNIPAQUE IOHEXOL 300 MG/ML SOLN IV. No oral contrast. COMPARISON:  10/30/2008, 12/19/2005 FINDINGS: Lower chest: Lung bases clear.  AICD lead RIGHT ventricle. Hepatobiliary: Small low-attenuation foci within liver question cysts. Large mass lesion identified at the medial aspect of the RIGHT lobe, 4.4 x 3.7 cm image 14, previously 7.6 x 6.9 cm, shown to be a large hemangioma on an earlier exam of 12/19/2005 and now further decreased in size. Remainder of liver unremarkable. Gallbladder normal appearance. Pancreas: Atrophic pancreas without mass Spleen: Normal appearance.  Tiny splenule anterior to spleen stable. Adrenals/Urinary Tract: Adrenal glands normal appearance. Small cortical scar at lateral aspect inferior pole RIGHT kidney. Kidneys ureters and bladder otherwise normal appearance. Stomach/Bowel: Normal appendix. Stomach decompressed. Small bowel loops unremarkable, decompressed. Distended fluid-filled colon from tip of cecum to distal descending colon. Abrupt change in caliber to normal caliber at the distal descending colon. Few  sigmoid diverticula. Minimal infiltrative change at the segment of caliber transition. It is uncertain whether this represents an active inflammatory process such as subtle diverticulitis, colonic stricture, or mass. Vascular/Lymphatic: Aorta normal caliber. Vascular structures grossly patent. No adenopathy. Reproductive: Unremarkable prostate gland and seminal vesicles Other: No free air or free fluid.  No hernia. Musculoskeletal: Osseous structures unremarkable. IMPRESSION: Dilated fluid-filled colon from cecal tip to distal descending colon more an abrupt transition in caliber is identified, associated with minimal pericolic infiltrative changes. This could represent subtle diverticulitis though stricture and mass are not excluded. Follow-up colonoscopy recommended to exclude neoplasm. Normal appendix. Decrease in size of known hepatic hemangioma since 2010. Electronically Signed   By: Ulyses Southward M.D.   On: 12/19/2018 22:11    Scheduled Meds:  amiodarone  200 mg Oral Daily   apixaban  5 mg Oral BID   digoxin  0.125 mg Oral Daily   [START ON 12/22/2018] influenza vac split quadrivalent PF  0.5 mL Intramuscular Tomorrow-1000   spironolactone  25 mg Oral Daily   torsemide  40 mg Oral BID   Continuous Infusions:  sodium chloride Stopped (12/20/18 1319)   piperacillin-tazobactam (ZOSYN)  IV 3.375 g (12/21/18 0427)     LOS:  1 day   Time spent: 35 minutes.  Tyrone Nine, MD [facility] [email or web address] 12/21/2018, 10:28 AM

## 2018-12-21 NOTE — Consult Note (Signed)
[facility] Consult: 11:30 AM 12/21/2018  LOS: 1 day    Referring Provider: Dr Hazeline Junkeryan Grunz  Primary Care Physician:  Ronnald NianLalonde, [name], MD Primary Gastroenterologist:  Dr. Marina GoodellPerry.  Seen once in the office    Reason for Consultation: Right abdominal pain, colonic obstruction with transition zone in descending colon.   HPI: Roberto [name] is a 53 y.o. male.  PMH Morbid obesity, BMI 51.  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  Nonischemic cardiomyopathy.  LVEF 25 to 30% on echo 07/2018.  S/p ICD. Liver hemangioma   After office visit with Dr. Marina GoodellPerry in 05/2015 to discuss colon cancer screening in average risk pt, he underwent Cologuard testing which was negative.   Patient normally has daily, formed, brown stools.  His last bowel movement was on Tuesday but he was passing flatus.  He had no response to using Gas-X, MiraLAX, prune juice and a small bottle of mag citrate.  4 AM on Sunday he awakened with severe pain in his right abdomen and presented to the ED.  Underwent unremarkable KUB.  No blood work or urinalysis was obtained.  He was discharged with advice to take a large bottle of mag citrate 9 self administer Fleet enema when he got home.  He followed this advice.  Only small balls of brown stool resulted.  The pain persisted and he returned to the ED at 7:30 PM at which time CT scan performed.  This showed transition zone in the distal descending colon.  Lab work showed elevated WBC count though the patient had never had any fever, sweats, chills.  Patient was admitted and started on Zosyn.  Patient has never had any nausea or vomiting.  He is tolerating sips of clears and has not had any more stool since the small balls of stool on Sunday.  Pain persists but is controlled for a few hours  with oxycodone  12/19/2018 KUB showed NOBGP 12/19/2018 CTAP w contrast.  Showed dilated, fluid-filled colon from cecum to distal descending colon with abrupt transition in caliber at distal descending colon a/w minor pericolic infiltrative changes.  Findings could represent subtle diverticulitis; stricture and mass not excluded.  Decreased size of liver hemangioma compared with 2010 WBCs elevated: 19.7 >> 16.2, day 3 Zosyn Eliquis is not on hold.  No family history of gastrointestinal cancers, ulcer disease, anemia.  Past Medical History:  Diagnosis Date   AICD (automatic cardioverter/defibrillator) present 08/2018   Arthritis    Chronic combined systolic and diastolic CHF (congestive heart failure) (HCC)    Deep vein thrombosis (HCC)    Diverticulitis    Lupus anticoagulant positive    Morbid obesity (HCC)    Mural thrombus of heart    coumadin   Nonischemic cardiomyopathy (HCC)    a) 12/17/11 echo: LVEF 25-30%, grade 3 diastolic dysfunction (c/w restriction), mod MR, mod LA/LV and mild RA dilatation; b) 12/18/11 cMRI: LVEF 38%, mod LV/mild RV dilatation, global HK, mild-mod RV sys dysfxn, no LV thrombus & patchy non-subendocardial delayed enhancement c/w infil dz  or prior myocarditis; c. 07/2018 EF 25-30%.   Paroxysmal SVT (supraventricular tachycardia) (HCC)    Pulmonary embolism (HCC)    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    Past Surgical History:  Procedure Laterality Date   A-FLUTTER ABLATION N/A 11/17/2016   Procedure: A-FLUTTER ABLATION;  Surgeon: Regan Lemming, MD;  Location: [facility] INVASIVE CV LAB;  Service: Cardiovascular;  Laterality: N/A;   CARDIAC CATHETERIZATION  06/2006   Angiographically normal cors   CARDIAC CATHETERIZATION  12/22/2011   R/LHC: normal cors, well-compensated HDs, LV dysfxn   CARDIOVERSION N/A 10/17/2016   Procedure: CARDIOVERSION;  Surgeon: Dolores Patty, MD;  Location: [facility] ENDOSCOPY;  Service: Cardiovascular;  Laterality: N/A;     CARDIOVERSION N/A 03/20/2017   Procedure: CARDIOVERSION;  Surgeon: Dolores Patty, MD;  Location: [facility] ENDOSCOPY;  Service: Cardiovascular;  Laterality: N/A;   ICD IMPLANT N/A 09/03/2018   Procedure: ICD IMPLANT;  Surgeon: Regan Lemming, MD;  Location: [facility] INVASIVE CV LAB;  Service: Cardiovascular;  Laterality: N/A;   LEFT AND RIGHT HEART CATHETERIZATION WITH CORONARY ANGIOGRAM N/A 12/22/2011   Procedure: LEFT AND RIGHT HEART CATHETERIZATION WITH CORONARY ANGIOGRAM;  Surgeon: Dolores Patty, MD;  Location: [facility] CATH LAB;  Service: Cardiovascular;  Laterality: N/A;   PATELLAR TENDON REPAIR     Left   RIGHT HEART CATH N/A 10/20/2016   Procedure: RIGHT HEART CATH;  Surgeon: Dolores Patty, MD;  Location: [facility] INVASIVE CV LAB;  Service: Cardiovascular;  Laterality: N/A;   TEE WITHOUT CARDIOVERSION N/A 03/20/2017   Procedure: TRANSESOPHAGEAL ECHOCARDIOGRAM (TEE);  Surgeon: Dolores Patty, MD;  Location: [facility] ENDOSCOPY;  Service: Cardiovascular;  Laterality: N/A;    Prior to Admission medications   Medication Sig Start Date End Date Taking? Authorizing Provider  acetaminophen (TYLENOL) 650 MG CR tablet Take 1,300 mg by mouth every 8 (eight) hours as needed for pain.   Yes [provider]  amiodarone (PACERONE) 200 MG tablet Take 1 tablet (200 mg total) by mouth daily. 10/18/18  Yes Bensimhon, Bevelyn Buckles, MD  coconut oil OIL Apply 1 application topically as needed (dry skin).    Yes [provider]  digoxin (LANOXIN) 0.125 MG tablet TAKE 1 TABLET(0.125 MG) BY MOUTH DAILY Patient taking differently: Take 0.125 mg by mouth daily.  11/01/18  Yes Bensimhon, Bevelyn Buckles, MD  ELIQUIS 5 MG TABS tablet TAKE 1 TABLET(5 MG) BY MOUTH TWICE DAILY Patient taking differently: Take 5 mg by mouth 2 (two) times daily.  03/22/18  Yes Bensimhon, Bevelyn Buckles, MD  ENTRESTO 49-51 MG TAKE 1 TABLET BY MOUTH TWICE DAILY Patient taking differently: Take 1 tablet by mouth 2 (two) times daily.  09/15/18   Yes Bensimhon, Bevelyn Buckles, MD  potassium chloride SA (KLOR-CON) 20 MEQ tablet Take 2 tablets (40 mEq total) by mouth 2 (two) times daily. 10/18/18  Yes Bensimhon, Bevelyn Buckles, MD  spironolactone (ALDACTONE) 25 MG tablet TAKE 1 TABLET(25 MG) BY MOUTH DAILY Patient taking differently: Take 25 mg by mouth daily.  12/03/18  Yes [name], MD  torsemide (DEMADEX) 20 MG tablet TAKE 2 TABLETS BY MOUTH TWICE DAILY. STOP LASIX Patient taking differently: Take 40 mg by mouth 2 (two) times daily.  06/29/18  Yes Bensimhon, Bevelyn Buckles, MD    Scheduled Meds:  amiodarone  200 mg Oral Daily   apixaban  5 mg Oral BID   digoxin  0.125 mg Oral Daily   [START ON 12/22/2018] influenza vac split quadrivalent PF  0.5  mL Intramuscular Tomorrow-1000   spironolactone  25 mg Oral Daily   torsemide  40 mg Oral BID   Infusions:  sodium chloride Stopped (12/20/18 1319)   piperacillin-tazobactam (ZOSYN)  IV 3.375 g (12/21/18 0427)   PRN Meds: sodium chloride, ondansetron **OR** ondansetron (ZOFRAN) IV, oxyCODONE-acetaminophen   Allergies as of 12/19/2018 - Review Complete 12/19/2018  Allergen Reaction Noted   Shrimp [shellfish allergy] Hives and Itching 12/16/2011    Family History  Problem Relation Age of Onset   Hypertension Mother    Clotting disorder Mother        mom with PEs   Cancer Mother        uterine cancer   Diabetes Father    Heart attack Father    Hypertension Other        Sibling   Diabetes Other    Obesity Other    Diabetes Sister    Obesity Sister    Obesity Sister    Colon cancer Neg Hx    Stomach cancer Neg Hx    Esophageal cancer Neg Hx    Rectal cancer Neg Hx    Liver cancer Neg Hx     Social History   Socioeconomic History   Marital status: Divorced    Spouse name: Not on file   Number of children: 5   Years of education: Not on file   Highest education level: Not on file  Occupational History   Occupation: Oceanographerinancial aid advisor for [employer]  [employer]    Employer: [employer]  Social Needs   Financial resource strain: Not on file   Food insecurity    Worry: Not on file    Inability: Not on file   Transportation needs    Medical: Not on file    Non-medical: Not on file  Tobacco Use   Smoking status: Never Smoker   Smokeless tobacco: Never Used  Substance and Sexual Activity   Alcohol use: No    Comment: Rarely   Drug use: No   Sexual activity: Not on file  Lifestyle   Physical activity    Days per week: Not on file    Minutes per session: Not on file   Stress: Not on file  Relationships   Social connections    Talks on phone: Not on file    Gets together: Not on file    Attends religious service: Not on file    Active member of club or organization: Not on file    Attends meetings of clubs or organizations: Not on file    Relationship status: Not on file   Intimate partner violence    Fear of current or ex partner: Not on file    Emotionally abused: Not on file    Physically abused: Not on file    Forced sexual activity: Not on file  Other Topics Concern   Not on file  Social History Narrative   [place] in [place]   Separated 03/2015, 5 children.   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  Exercises with weights and some walking. 12/2015    REVIEW OF SYSTEMS: Constitutional: Normally patient has good energy, no fatigue.  He is able to perform all activities of daily living and can climbs stairs without issues. ENT:  No nose bleeds Pulm: No DOE, no shortness of breath.  Does not get a whole lot of exercise however..  No cough. CV:  No palpitations, no chest pain.  Chronic lower extremity edema that started after knee surgeries bilaterally. GU:  No hematuria, no frequency GI: See HPI.  Normally patient has no reflux, no dysphagia, no constipation, no blood per rectum. Heme: No unusual bleeding or bruising. Transfusions: None ever. Neuro:  No headaches, no  peripheral tingling or numbness.  No dizziness.  No syncope, no seizures. Derm:  No itching, no rash or sores.  Endocrine:  No sweats or chills.  No polyuria or dysuria Immunization: Reviewed.  I do not see current flu shot. Travel:  None beyond local counties in last few months.    PHYSICAL EXAM: Vital signs in last 24 hours: Vitals:   12/20/18 2104 12/21/18 0424  BP: 101/63 104/78  Pulse: 79 74  Resp: 18 16  Temp: 98.7 F (37.1 C) 99 F (37.2 C)  SpO2: 95% 95%   Wt Readings from Last 3 Encounters:  12/19/18 (!) 151 kg  12/19/18 (!) 151 kg  12/03/18 (!) 151.2 kg    General: Morbidly obese, pleasant, comfortable, nonill appearing Head: No facial asymmetry or swelling.  No signs of head trauma. Eyes: No scleral icterus.  No conjunctival pallor.  EOMI. Ears: Not hard of hearing Nose: No discharge, no congestion Mouth: Oral mucosa moist, pink, clear.  Tongue midline.  Good dentition. Neck: No JVD, no masses, no thyromegaly. Lungs: Clear bilaterally.  No labored breathing, cough. Heart: RRR.  No MRG.  S1, S2 present.  ICD positioned in upper left chest Abdomen: Obese, soft.  Tinkling bowel sounds on the left.  Tenderness begins at the epigastrium continues throughout the right upper and lower abdomen and crosses about to the lower midline.  No tenderness on the left.  No guarding or rebound.  No hernias, HSM, bruits appreciated.   Rectal: Not performed Musc/Skeltl: No joint redness or swelling.  Knee surgery scars bilaterally. Extremities: Nonpitting bilateral pitting edema in the lower legs, slightly worse on the left Neurologic: Oriented x3.  No weakness, no tremors.  No gross deficits. Skin: No rash, no sores, no suspicious lesions. Nodes: No cervical adenopathy. Psych: Calm, pleasant, cooperative, fluid speech.  Intake/Output from previous day: 12/07 0701 - 12/08 0700 In: 686.7 [P.O.:493; I.V.:4.1; IV Piggyback:189.6] Out: 775 [Urine:775] Intake/Output this shift: No  intake/output data recorded.  LAB RESULTS: Recent Labs    12/19/18 2038 12/20/18 1020 12/21/18 0041  WBC 19.7* 16.6* 16.2*  HGB 15.9 14.7 14.8  HCT 50.3 46.5 45.7  PLT 221 182 178   BMET Lab Results  Component Value Date   NA 140 12/21/2018   NA 141 12/20/2018   NA 140 12/19/2018   K 3.7 12/21/2018   K 3.7 12/20/2018   K 3.6 12/19/2018   CL 99 12/21/2018   CL 102 12/20/2018   CL 101 12/19/2018   CO2 28 12/21/2018   CO2 28 12/20/2018   CO2 28 12/19/2018   GLUCOSE 107 (H) 12/21/2018   GLUCOSE 131 (H) 12/20/2018   GLUCOSE 122 (H) 12/19/2018   BUN 16 12/21/2018   BUN 20 12/20/2018   BUN 17 12/19/2018   CREATININE 1.41 (H) 12/21/2018   CREATININE 1.52 (H) 12/20/2018   CREATININE 1.42 (H) 12/19/2018   CALCIUM 8.0 (L) 12/21/2018   CALCIUM 8.2 (L) 12/20/2018   CALCIUM 8.6 (L) 12/19/2018   LFT Recent Labs    12/19/18 2038 12/21/18 0041  PROT 7.7 6.6  ALBUMIN 3.2* 2.7*  AST 21 18  ALT 13 12  ALKPHOS 45 38  BILITOT 0.9 0.8   PT/INR Lab Results  Component Value Date   INR 1.43 03/17/2017   INR 1.3 (H) 03/13/2017   INR 1.5 (H) 12/08/2016   Hepatitis Panel No results for input(s): HEPBSAG, HCVAB, HEPAIGM, HEPBIGM in the last 72 hours. C-Diff No components found for: CDIFF Lipase     Component Value Date/Time   LIPASE 27 12/19/2018 2038    Drugs of Abuse  No results found for: LABOPIA, COCAINSCRNUR, LABBENZ, AMPHETMU, THCU, LABBARB   RADIOLOGY STUDIES: Ct Abdomen Pelvis W Contrast  Result Date: 12/19/2018 CLINICAL DATA:  RIGHT lower quadrant pain and constipation for 2 days question appendicitis, history CHF, non ischemic cardiomyopathy, pulmonary embolism, PSVT EXAM: CT ABDOMEN AND PELVIS WITH CONTRAST TECHNIQUE: Multidetector CT imaging of the abdomen and pelvis was performed using the standard protocol following bolus administration of intravenous contrast. Sagittal and coronal MPR images reconstructed from axial data set. CONTRAST:  116mL OMNIPAQUE  IOHEXOL 300 MG/ML SOLN IV. No oral contrast. COMPARISON:  10/30/2008, 12/19/2005 FINDINGS: Lower chest: Lung bases clear.  AICD lead RIGHT ventricle. Hepatobiliary: Small low-attenuation foci within liver question cysts. Large mass lesion identified at the medial aspect of the RIGHT lobe, 4.4 x 3.7 cm image 14, previously 7.6 x 6.9 cm, shown to be a large hemangioma on an earlier exam of 12/19/2005 and now further decreased in size. Remainder of liver unremarkable. Gallbladder normal appearance. Pancreas: Atrophic pancreas without mass Spleen: Normal appearance.  Tiny splenule anterior to spleen stable. Adrenals/Urinary Tract: Adrenal glands normal appearance. Small cortical scar at lateral aspect inferior pole RIGHT kidney. Kidneys ureters and bladder otherwise normal appearance. Stomach/Bowel: Normal appendix. Stomach decompressed. Small bowel loops unremarkable, decompressed. Distended fluid-filled colon from tip of cecum to distal descending colon. Abrupt change in caliber to normal caliber at the distal descending colon. Few sigmoid diverticula. Minimal infiltrative change at the segment of caliber transition. It is uncertain whether this represents an active inflammatory process such as subtle diverticulitis, colonic stricture, or mass. Vascular/Lymphatic: Aorta normal caliber. Vascular structures grossly patent. No adenopathy. Reproductive: Unremarkable prostate gland and seminal vesicles Other: No free air or free fluid.  No hernia. Musculoskeletal: Osseous structures unremarkable. IMPRESSION: Dilated fluid-filled colon from cecal tip to distal descending colon more an abrupt transition in caliber is identified, associated with minimal pericolic infiltrative changes. This could represent subtle diverticulitis though stricture and mass are not excluded. Follow-up colonoscopy recommended to exclude neoplasm. Normal appendix. Decrease in size of known hepatic hemangioma since 2010. Electronically Signed   By:  Lavonia Dana M.D.   On: 12/19/2018 22:11      IMPRESSION:   *   Right abdominal pain, acute constipation/obstipation.  Transition zone at distal descending colon, ? diverticulitis vs stricture vs neoplasia? Cologuard testing negative 01/2016  *     Liver hemangioma, decreased size compared with 2010.  *    Leukocytosis, improved.  Day 3 antibiotic.    *    COVID-19 negative  *     Chronic Eliquis.  Remote DVT PE 2007; cardiac mural thrombus. Eliquis is not on hold, last dose at 1050 this morning    PLAN:     *   Per Dr Rush Landmark.   If colonoscopy entertained, not sure he be able to complete the oral prep.  Flex sig with preprocedure tapwater enemas may be a better alternative.   Azucena Freed  12/21/2018, 11:30 AM Phone [phone]

## 2018-12-22 ENCOUNTER — Inpatient Hospital Stay (HOSPITAL_COMMUNITY): Payer: BC Managed Care – PPO

## 2018-12-22 DIAGNOSIS — I5022 Chronic systolic (congestive) heart failure: Secondary | ICD-10-CM

## 2018-12-22 DIAGNOSIS — Z01810 Encounter for preprocedural cardiovascular examination: Secondary | ICD-10-CM

## 2018-12-22 LAB — CBC WITH DIFFERENTIAL/PLATELET
Abs Immature Granulocytes: 0.14 10*3/uL — ABNORMAL HIGH (ref 0.00–0.07)
Basophils Absolute: 0.1 10*3/uL (ref 0.0–0.1)
Basophils Relative: 0 %
Eosinophils Absolute: 0.2 10*3/uL (ref 0.0–0.5)
Eosinophils Relative: 1 %
HCT: 45 % (ref 39.0–52.0)
Hemoglobin: 14.6 g/dL (ref 13.0–17.0)
Immature Granulocytes: 1 %
Lymphocytes Relative: 10 %
Lymphs Abs: 1.6 10*3/uL (ref 0.7–4.0)
MCH: 28.9 pg (ref 26.0–34.0)
MCHC: 32.4 g/dL (ref 30.0–36.0)
MCV: 88.9 fL (ref 80.0–100.0)
Monocytes Absolute: 1.8 10*3/uL — ABNORMAL HIGH (ref 0.1–1.0)
Monocytes Relative: 12 %
Neutro Abs: 12.1 10*3/uL — ABNORMAL HIGH (ref 1.7–7.7)
Neutrophils Relative %: 76 %
Platelets: 168 10*3/uL (ref 150–400)
RBC: 5.06 MIL/uL (ref 4.22–5.81)
RDW: 13.4 % (ref 11.5–15.5)
WBC: 15.9 10*3/uL — ABNORMAL HIGH (ref 4.0–10.5)
nRBC: 0 % (ref 0.0–0.2)

## 2018-12-22 LAB — BASIC METABOLIC PANEL
Anion gap: 12 (ref 5–15)
BUN: 17 mg/dL (ref 6–20)
CO2: 26 mmol/L (ref 22–32)
Calcium: 8.2 mg/dL — ABNORMAL LOW (ref 8.9–10.3)
Chloride: 97 mmol/L — ABNORMAL LOW (ref 98–111)
Creatinine, Ser: 1.51 mg/dL — ABNORMAL HIGH (ref 0.61–1.24)
GFR calc Af Amer: >60 mL/min (ref >60)
GFR calc non Af Amer: 52 mL/min — ABNORMAL LOW (ref >60)
Glucose, Bld: 98 mg/dL (ref 70–99)
Potassium: 3.4 mmol/L — ABNORMAL LOW (ref 3.5–5.1)
Sodium: 135 mmol/L (ref 135–145)

## 2018-12-22 MED ORDER — SODIUM CHLORIDE 0.9% FLUSH: 10.0000 mL | INTRAVENOUS | Status: DC | PRN

## 2018-12-22 MED ORDER — APIXABAN 5 MG PO TABS: 5.0000 mg | ORAL_TABLET | Freq: Two times a day (BID) | ORAL | Status: DC

## 2018-12-22 MED ORDER — HEPARIN (PORCINE) 25000 UT/250ML-% IV SOLN
2350.0000 [IU]/h | INTRAVENOUS | Status: DC
  Administered 2018-12-22 – 2018-12-23 (×2): 1500 [IU]/h via INTRAVENOUS
  Administered 2018-12-24: 2000 [IU]/h via INTRAVENOUS
  Administered 2018-12-24 – 2018-12-25 (×3): 2200 [IU]/h via INTRAVENOUS
  Administered 2018-12-26: 2450 [IU]/h via INTRAVENOUS
  Administered 2018-12-26: 2200 [IU]/h via INTRAVENOUS
  Administered 2018-12-27: 2450 [IU]/h via INTRAVENOUS
  Administered 2018-12-27 – 2018-12-28 (×2): 2350 [IU]/h via INTRAVENOUS
  Filled 2018-12-22 (×12): qty 250

## 2018-12-22 MED ORDER — POTASSIUM CHLORIDE CRYS ER 20 MEQ PO TBCR
20.0000 meq | EXTENDED_RELEASE_TABLET | Freq: Once | ORAL | Status: AC
  Administered 2018-12-22: 20 meq via ORAL
  Filled 2018-12-22: qty 1

## 2018-12-22 MED ORDER — BISACODYL 10 MG RE SUPP
10.0000 mg | Freq: Four times a day (QID) | RECTAL | Status: DC
  Administered 2018-12-22: 11:00:00 10 mg via RECTAL
  Filled 2018-12-22: qty 1

## 2018-12-22 NOTE — Consult Note (Addendum)
Cardiology Consultation:   Patient ID: Roberto FillJoseph T Russell MRN: [id number]; DOB: February 10, 1965  Admit date: 12/19/2018 Date of Consult: 12/22/2018  Primary Care Provider: Ronnald NianLalonde, [name], MD Primary Cardiologist: Arvilla Meresaniel Bensimhon, MD  Primary Electrophysiologist:  Regan LemmingWill Martin Camnitz, MD   Patient Profile:   Roberto [name] is a 53 y.o. male with a hx of chronic diastolic HF, h/o of Lv thrombus, nonischemic cardiomyopathy (EF 25-30% 2013, EF 2020 25-30%), PSVT (elected for medical therapy over RFA), DVT/ PE, Paroxysmal afib on amiodarone, lupus, and morbid obesity who is being seen today for the evaluation of pre-op cardaic evaluation at the request of Dr. Ella JubileeArrien.  Patient follows with Dr. Elberta Fortisamnitz and Dr. Gala RomneyBensimhon for the above cardiac issues. He has been following with Dr. Gala RomneyBensimhon and the advanced [facility] clinic for many years. He underwent a LHC in 2008 which showed normal coronaries. EF at that time was 45%. In 2018 he was admitted with afib and tachycardia induced cardiomyopathy. EF was down to 15%. He underwent cardioversion to NSR and placed on amiodarone. He underwent diuresis as well. He had AFL ablation 11/2016. He had successful cardioversion for afib 03/2017. In 01/2018 he saw Dr. Elberta Fortisamnitz for ICD evaluation. Echo showed EF 25-30%, diastolic dysfunction, diffuse hypokinesis, RV normal function, LA mildly dilated. He underwent ICD implantation on 08/2018. He was seen in the office 11/2018 for follow-up and was doing well.   History of Present Illness:   Roberto [name] presented to the ED 12/19/18 for right lower quadrant abdominal pain.  He had been taking oxycodone for pain control but it wasn't helping. Patient was hypotensive with leukocytosis. COVID negative.  CT of the abdomen showed dilated fluid-filled colon, possible diverticulosis, possible stricture or mass. GI and general surgery were consulted. Patient was started on IV abx admitted for possible diverticulitis vs stricture vs neoplasm.  Eliquis was continued.   The patient denies recent chest pain, shortness of breath, or palpitation. No lower leg edema, orthopnea, or PND. He has been compliant with his medications. He lives with his daughter and works as a Firefighterfinancial advisor. He works out 3-4 times weekly without symptoms. He is very functional at baseline. Denies tobacco/alcohol/drug use.   Heart Pathway Score:     Past Medical History:  Diagnosis Date   AICD (automatic cardioverter/defibrillator) present 08/2018   Arthritis    Chronic combined systolic and diastolic CHF (congestive heart failure) (HCC)    Deep vein thrombosis (HCC)    Diverticulitis    Lupus anticoagulant positive    Morbid obesity (HCC)    Mural thrombus of heart    coumadin   Nonischemic cardiomyopathy (HCC)    a) 12/17/11 echo: LVEF 25-30%, grade 3 diastolic dysfunction (c/w restriction), mod MR, mod LA/LV and mild RA dilatation; b) 12/18/11 cMRI: LVEF 38%, mod LV/mild RV dilatation, global HK, mild-mod RV sys dysfxn, no LV thrombus & patchy non-subendocardial delayed enhancement c/w infil dz or prior myocarditis; c. 07/2018 EF 25-30%.   Paroxysmal SVT (supraventricular tachycardia) (HCC)    Pulmonary embolism (HCC)    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    Past Surgical History:  Procedure Laterality Date   A-FLUTTER ABLATION N/A 11/17/2016   Procedure: A-FLUTTER ABLATION;  Surgeon: Regan Lemmingamnitz, Will Martin, MD;  Location: [facility] INVASIVE CV LAB;  Service: Cardiovascular;  Laterality: N/A;   CARDIAC CATHETERIZATION  06/2006   Angiographically normal cors   CARDIAC CATHETERIZATION  12/22/2011   R/LHC: normal cors, well-compensated HDs, LV dysfxn   CARDIOVERSION N/A  10/17/2016   Procedure: CARDIOVERSION;  Surgeon: Dolores Patty, MD;  Location: [facility] ENDOSCOPY;  Service: Cardiovascular;  Laterality: N/A;   CARDIOVERSION N/A 03/20/2017   Procedure: CARDIOVERSION;  Surgeon: Dolores Patty, MD;  Location: [facility] ENDOSCOPY;  Service:  Cardiovascular;  Laterality: N/A;   ICD IMPLANT N/A 09/03/2018   Procedure: ICD IMPLANT;  Surgeon: Regan Lemming, MD;  Location: [facility] INVASIVE CV LAB;  Service: Cardiovascular;  Laterality: N/A;   LEFT AND RIGHT HEART CATHETERIZATION WITH CORONARY ANGIOGRAM N/A 12/22/2011   Procedure: LEFT AND RIGHT HEART CATHETERIZATION WITH CORONARY ANGIOGRAM;  Surgeon: Dolores Patty, MD;  Location: [facility] CATH LAB;  Service: Cardiovascular;  Laterality: N/A;   PATELLAR TENDON REPAIR     Left   RIGHT HEART CATH N/A 10/20/2016   Procedure: RIGHT HEART CATH;  Surgeon: Dolores Patty, MD;  Location: [facility] INVASIVE CV LAB;  Service: Cardiovascular;  Laterality: N/A;   TEE WITHOUT CARDIOVERSION N/A 03/20/2017   Procedure: TRANSESOPHAGEAL ECHOCARDIOGRAM (TEE);  Surgeon: Dolores Patty, MD;  Location: [facility] ENDOSCOPY;  Service: Cardiovascular;  Laterality: N/A;     Home Medications:  Prior to Admission medications   Medication Sig Start Date End Date Taking? Authorizing Provider  acetaminophen (TYLENOL) 650 MG CR tablet Take 1,300 mg by mouth every 8 (eight) hours as needed for pain.   Yes [provider]  amiodarone (PACERONE) 200 MG tablet Take 1 tablet (200 mg total) by mouth daily. 10/18/18  Yes Bensimhon, Bevelyn Buckles, MD  coconut oil OIL Apply 1 application topically as needed (dry skin).    Yes [provider]  digoxin (LANOXIN) 0.125 MG tablet TAKE 1 TABLET(0.125 MG) BY MOUTH DAILY Patient taking differently: Take 0.125 mg by mouth daily.  11/01/18  Yes Bensimhon, Bevelyn Buckles, MD  ELIQUIS 5 MG TABS tablet TAKE 1 TABLET(5 MG) BY MOUTH TWICE DAILY Patient taking differently: Take 5 mg by mouth 2 (two) times daily.  03/22/18  Yes Bensimhon, Bevelyn Buckles, MD  ENTRESTO 49-51 MG TAKE 1 TABLET BY MOUTH TWICE DAILY Patient taking differently: Take 1 tablet by mouth 2 (two) times daily.  09/15/18  Yes Bensimhon, Bevelyn Buckles, MD  potassium chloride SA (KLOR-CON) 20 MEQ tablet Take 2 tablets (40 mEq total)  by mouth 2 (two) times daily. 10/18/18  Yes Bensimhon, Bevelyn Buckles, MD  spironolactone (ALDACTONE) 25 MG tablet TAKE 1 TABLET(25 MG) BY MOUTH DAILY Patient taking differently: Take 25 mg by mouth daily.  12/03/18  Yes [name], MD  torsemide (DEMADEX) 20 MG tablet TAKE 2 TABLETS BY MOUTH TWICE DAILY. STOP LASIX Patient taking differently: Take 40 mg by mouth 2 (two) times daily.  06/29/18  Yes Bensimhon, Bevelyn Buckles, MD    Inpatient Medications: Scheduled Meds:  amiodarone  200 mg Oral Daily   digoxin  0.125 mg Oral Daily   influenza vac split quadrivalent PF  0.5 mL Intramuscular Tomorrow-1000   Continuous Infusions:  sodium chloride Stopped (12/20/18 1319)   heparin     piperacillin-tazobactam (ZOSYN)  IV 3.375 g (12/22/18 1339)   PRN Meds: sodium chloride, ondansetron **OR** ondansetron (ZOFRAN) IV, oxyCODONE-acetaminophen, sodium chloride flush  Allergies:    Allergies  Allergen Reactions   Shrimp [Shellfish Allergy] Hives and Itching    Social History:   Social History   Socioeconomic History   Marital status: Divorced    Spouse name: Not on file   Number of children: 5   Years of education: Not on file   Highest education level: Not  on file  Occupational History   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[employer]: [employer]  Social Needs   Financial resource strain: Not on file   Food insecurity    Worry: Not on file    Inability: Not on file   Transportation needs    Medical: Not on file    Non-medical: Not on file  Tobacco Use   Smoking status: Never Smoker   Smokeless tobacco: Never Used  Substance and Sexual Activity   Alcohol use: No    Comment: Rarely   Drug use: No   Sexual activity: Not on file  Lifestyle   Physical activity    Days per week: Not on file    Minutes per session: Not on file   Stress: Not on file  Relationships   Social connections    Talks on phone: Not on file     Gets together: Not on file    Attends religious service: Not on file    Active member of club or organization: Not on file    Attends meetings of clubs or organizations: Not on file    Relationship status: Not on file   Intimate partner violence    Fear of current or ex partner: Not on file    Emotionally abused: Not on file    Physically abused: Not on file    Forced sexual activity: Not on file  Other Topics Concern   Not on file  Social History Narrative   Lives in [place] 03/2015, 5 children.   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  Exercises with weights and some walking. 12/2015    Family History:   Family History  Problem Relation Age of Onset   Hypertension Mother    Clotting disorder Mother        mom with PEs   Cancer Mother        uterine cancer   Diabetes Father    Heart attack Father    Hypertension Other        Sibling   Diabetes Other    Obesity Other    Diabetes Sister    Obesity Sister    Obesity Sister    Colon cancer Neg Hx    Stomach cancer Neg Hx    Esophageal cancer Neg Hx    Rectal cancer Neg Hx    Liver cancer Neg Hx      ROS:  Please see the history of present illness.  All other ROS reviewed and negative.     Physical Exam/Data:   Vitals:   12/21/18 1445 12/21/18 1954 12/22/18 0438 12/22/18 1329  BP: 106/74 105/65 102/65 93/60  Pulse: 82 78 67 84  Resp:  (!) 22 20 18   Temp: 99.7 F (37.6 C) 100 F (37.8 C) 98.7 F (37.1 C) 99.1 F (37.3 C)  TempSrc: Oral Oral Oral Oral  SpO2: 94% 95% 91% 93%  Weight:      Height:        Intake/Output Summary (Last 24 hours) at 12/22/2018 1647 Last data filed at 12/22/2018 1504 Gross per 24 hour  Intake 1452.58 ml  Output 500 ml  Net 952.58 ml   Last 3 Weights 12/19/2018 12/19/2018 12/03/2018  Weight (lbs) 333 lb 333 lb 333 lb 6.4 oz  Weight (kg) 151.048 kg 151.048 kg 151.229 kg     Body mass index is 50.63 kg/m.  General:  Obese  male in  no acute distress HEENT: normal Lymph: no adenopathy Neck: no JVD Endocrine:  No thryomegaly Vascular: No carotid bruits; FA pulses 2+ bilaterally without bruits  Cardiac:  normal S1, S2; RRR; no murmur  Lungs:  clear to auscultation bilaterally, no wheezing, rhonchi or rales  Abd: soft, nontender, no hepatomegaly  Ext: no edema Musculoskeletal:  No deformities, BUE and BLE strength normal and equal Skin: warm and dry  Neuro:  CNs 2-12 intact, no focal abnormalities noted Psych:  Normal affect   EKG:  The EKG was personally reviewed and demonstrates:  NSR, 81 bpm, possible first degree AV block, LBBB QRS 126 ms, QtC 505 ms, nonspecific T wave changes Telemetry:  Telemetry was personally reviewed and demonstrates:  N/A  Relevant CV Studies:  Echo 07/2018 1. The left ventricle has severely reduced systolic function, with an ejection fraction of 25-30%. The cavity size was moderately dilated. Left ventricular diastolic Doppler parameters are consistent with pseudonormalization. Left ventricular diffuse  hypokinesis.  2. The right ventricle has normal systolic function. The cavity was normal. There is no increase in right ventricular wall thickness.  3. Left atrial size was mildly dilated.  4. No evidence of mitral valve stenosis. Trivial mitral regurgitation.  5. The aortic valve is tricuspid. No stenosis of the aortic valve.  6. The aortic root is normal in size and structure.  7. The inferior vena cava was dilated in size with <50% respiratory variability. No complete TR doppler jet so unable to estimate PA systolic pressure.  RHC 10/20/16  RA = 3 RV = 45/7 PA = 49/20 (31) PCW = 28 Fick cardiac output/index = 5.0/2.1 PVR = 0.5 WU Ao sat = 95% PA sat = 64%, 61%  12/17/11 ECHO 25-30%  05/20/2012 ECHO EF 40-45%  CARDIAC MRI - 12/18/11  1. Moderately dilated left ventricle with moderately decreased systolic function, EF 38%. Global hypokinesis.  2. Mildly dilated right ventricle  with mild to moderately decreased systolic function.  3. No definite LV thrombus noted.  4. Patchy non-subendocardial delayed enhancement seen in the ventricular septum (see above for description). This is not suggestive of a coronary disease pattern. This could be suggestive of infiltrative disease versus prior myocarditis.  RHC/LHC 12/22/11  Cors: Normal  Laboratory Data:  High Sensitivity Troponin:  No results for input(s): TROPONINIHS in the last 720 hours.   Chemistry Recent Labs  Lab 12/20/18 1020 12/21/18 0041 12/22/18 0247  NA 141 140 135  K 3.7 3.7 3.4*  CL 102 99 97*  CO2 GLUCOSE 131* 107* 98  BUN CREATININE 1.52* 1.41* 1.51*  CALCIUM 8.2* 8.0* 8.2*  GFRNONAA 52* 56* 52*  GFRAA 60* >60 >60  ANIONGAP Recent Labs  Lab 12/19/18 2038 12/21/18 0041  PROT 7.7 6.6  ALBUMIN 3.2* 2.7*  AST 21 18  ALT 13 12  ALKPHOS 45 38  BILITOT 0.9 0.8   Hematology Recent Labs  Lab 12/20/18 1020 12/21/18 0041 12/22/18 0247  WBC 16.6* 16.2* 15.9*  RBC 5.08 5.06 5.06  HGB 14.7 14.8 14.6  HCT 46.5 45.7 45.0  MCV 91.5 90.3 88.9  MCH 28.9 29.2 28.9  MCHC 31.6 32.4 32.4  RDW 13.4 13.4 13.4  PLT 182 178 168   BNPNo results for input(s): BNP, PROBNP in the last 168 hours.  DDimer No results for input(s): DDIMER in the last 168 hours.   Radiology/Studies:  Dg Abdomen 1 View  Result Date: 12/19/2018 CLINICAL DATA:  Abdominal pain and constipation. EXAM: ABDOMEN - 1 VIEW COMPARISON:  None. FINDINGS: Bowel gas is seen in the nondependent portion of the colon which is nondilated. A few gas-filled nondilated small bowel loops are seen in the central abdomen. No evidence of dilated bowel loops. No radiopaque calculi identified. IMPRESSION: Nonobstructive bowel gas pattern.  No acute findings Electronically Signed   By: Danae OrleansJohn A Stahl M.D.   On: 12/19/2018 06:12   Ct Abdomen Pelvis W Contrast  Result Date: 12/19/2018 CLINICAL DATA:  RIGHT lower  quadrant pain and constipation for 2 days question appendicitis, history CHF, non ischemic cardiomyopathy, pulmonary embolism, PSVT EXAM: CT ABDOMEN AND PELVIS WITH CONTRAST TECHNIQUE: Multidetector CT imaging of the abdomen and pelvis was performed using the standard protocol following bolus administration of intravenous contrast. Sagittal and coronal MPR images reconstructed from axial data set. CONTRAST:  100mL OMNIPAQUE IOHEXOL 300 MG/ML SOLN IV. No oral contrast. COMPARISON:  10/30/2008, 12/19/2005 FINDINGS: Lower chest: Lung bases clear.  AICD lead RIGHT ventricle. Hepatobiliary: Small low-attenuation foci within liver question cysts. Large mass lesion identified at the medial aspect of the RIGHT lobe, 4.4 x 3.7 cm image 14, previously 7.6 x 6.9 cm, shown to be a large hemangioma on an earlier exam of 12/19/2005 and now further decreased in size. Remainder of liver unremarkable. Gallbladder normal appearance. Pancreas: Atrophic pancreas without mass Spleen: Normal appearance.  Tiny splenule anterior to spleen stable. Adrenals/Urinary Tract: Adrenal glands normal appearance. Small cortical scar at lateral aspect inferior pole RIGHT kidney. Kidneys ureters and bladder otherwise normal appearance. Stomach/Bowel: Normal appendix. Stomach decompressed. Small bowel loops unremarkable, decompressed. Distended fluid-filled colon from tip of cecum to distal descending colon. Abrupt change in caliber to normal caliber at the distal descending colon. Few sigmoid diverticula. Minimal infiltrative change at the segment of caliber transition. It is uncertain whether this represents an active inflammatory process such as subtle diverticulitis, colonic stricture, or mass. Vascular/Lymphatic: Aorta normal caliber. Vascular structures grossly patent. No adenopathy. Reproductive: Unremarkable prostate gland and seminal vesicles Other: No free air or free fluid.  No hernia. Musculoskeletal: Osseous structures unremarkable.  IMPRESSION: Dilated fluid-filled colon from cecal tip to distal descending colon more an abrupt transition in caliber is identified, associated with minimal pericolic infiltrative changes. This could represent subtle diverticulitis though stricture and mass are not excluded. Follow-up colonoscopy recommended to exclude neoplasm. Normal appendix. Decrease in size of known hepatic hemangioma since 2010. Electronically Signed   By: Ulyses SouthwardMark  Boles M.D.   On: 12/19/2018 22:11    Assessment and Plan:   Pre-op evaluation for possible abdominal surgery Patient admitted with possible diverticolitis vs stricture vs neoplasm. Flex sigmoid performed for further evaluation. Unsure if will proceed with surgery, but likely - Patient has cardiac history of Chronic combined HF s/p ICD, afib s/p multiple cardioversions and ablation in 2019, chronic anticoagulation with Eliquis, nonischemic cardiomyopathy. Also has many comorbidities. - Last echo was 07/2018 with EF 25-30%, pseudonormalization, normal RV systolic unction, LA mildly dilated. Will not repeat - EKG with NSR - Patient denies recent lower leg edema, shortness of breath, orthopnea. Has been compliant with HF meds - Cath in 2008 showed normal coronaries. No h/o of unstable angina - Eliquis held and heparin started. Restart Eliquis post-procedure - Patient is very functional at baseline. He exercises about 3-4 time weekly. Able to walk 1-2 blocks and climb a flight of stairs. - Duke Activity Status Index 7.0 METS - Revised Cardiac risk assessment Class II  risk, 6.0% chance of 30 day risk of death, MI, or cardiac arrest - Likely OK to proceed with surgery given acute presentation. Will need to monitor for afib and fluid status.     For questions or updates, please contact [place] Please consult [email or web address] for contact info under     Signed, Cadence Ninfa Meeker, PA-C  12/22/2018 4:47 PM   I have personally seen and examined this patient. I agree  with the assessment and plan as outlined above.  He has been followed by Dr. Haroldine Laws for many years. He is known to have chronic systolic CHF, a non-ischemic cardiomyopathy and has an ICD in place. He is chronic Eliquis for atrial fib with prior LV thrombus and prior DVT/PE. He has been compliant will all medications. He reports no volume overload at home, chest pain, exertional fatigue or syncope.  He is admitted now with abdominal pain. Plans for exploratory surgery tomorrow.  EKG reviewed. Sinus with LBBB.  Labs reviewed by me.  My exam:  General: Obese male in NAD.  HEENT: OP clear, mucus membranes moist  SKIN: warm, dry. No rashes. Neuro: No focal deficits  Musculoskeletal: Muscle strength 5/5 all ext  Psychiatric: Mood and affect normal  Neck: No JVD, no carotid bruits, no thyromegaly, no lymphadenopathy.  Lungs:Clear bilaterally, no wheezes, rhonci, crackles Cardiovascular: Regular rate and rhythm. No murmurs, gallops or rubs. Abdomen:Soft. Bowel sounds present. Non-tender.  Extremities: No lower extremity edema. Pulses are 2 + in the bilateral DP/PT.  Pre-operative cardiovascular risk assessment: He has no evidence of CHF, no signs of unstable angina. He has many chronic cardiac issues but seems to be acceptable risk for his planned surgical procedure. Eliquis has been held. IV heparin to start tonight. We will follow with you.   Lauree Chandler 12/22/2018 5:09 PM

## 2018-12-22 NOTE — Consult Note (Addendum)
[facility] Surgery Consult Note  Roberto [name] [birth year]  [id number].    Requesting MD: Azucena Freed, PA-C Chief Complaint: Abdominal cramping and no bowel movement x1 week Reason for Consult: Diverticulitis vs stricture vs malignancy   HPI: Patient is a 53 year old male with a significant history of chronic heart failure, DVT on chronic anticoagulation, nonischemic cardiomyopathy with an EF of 25 to 30% history of PE, PAF, lupus anticoagulant and morbid obesity.  Presented to the ED at [facility] on 12/19/2018 with the above complaints.  The gave him a bowel prep kit and an enema to use at home.  He was not able to have any results with the enema and cannot tolerate the bowel prep.  He returned to [facility], with right lower quadrant abdominal pain 7/10.  He underwent further work-up he was afebrile, vital signs were stable.  Labs showed a creatinine of 1.42, calcium 8.6, albumin 3.2 LFTs were normal, WBC 19.7, hemoglobin 15.9, hematocrit 50.3, platelets 221,000.  Covid was negative.  CT scan showed a dilated fluid-filled colon from cecal tip to the distal descending colon with an abrupt transition identified associated with the pericolonic infiltrative changes.  This could be a stricture or a mass.  He was transferred to [facility] and started on IV antibiotics.  He has been evaluated by GI will undergo an unprepped flex sigmoidoscopy tomorrow and we have been asked to see, and assist with his care.  ROS: Review of Systems  Constitutional: Negative.   HENT: Negative.   Eyes: Negative.   Respiratory: Negative.   Cardiovascular: Negative.   Gastrointestinal: Positive for abdominal pain (Pains primarily right lower quadrant but he also has some pain right upper quadrant.) and constipation. Negative for blood in stool, diarrhea, heartburn, melena, nausea and vomiting.  Genitourinary: Negative.   Musculoskeletal: Negative.   Skin: Negative.    Neurological: Negative.   Endo/Heme/Allergies: Bruises/bleeds easily.  Psychiatric/Behavioral: Negative.     Family History  Problem Relation Age of Onset  . Hypertension Mother   . Clotting disorder Mother        mom with PEs  . Cancer Mother        uterine cancer  . Diabetes Father   . Heart attack Father   . Hypertension Other        Sibling  . Diabetes Other   . Obesity Other   . Diabetes Sister   . Obesity Sister   . Obesity Sister   . Colon cancer Neg Hx   . Stomach cancer Neg Hx   . Esophageal cancer Neg Hx   . Rectal cancer Neg Hx   . Liver cancer Neg Hx     Past Medical History:  Diagnosis Date  . AICD (automatic cardioverter/defibrillator) present 08/2018  . Arthritis   . Chronic combined systolic and diastolic CHF (congestive heart failure) ([place])   . Deep vein thrombosis ([place])   . Diverticulitis   . Lupus anticoagulant positive   . Morbid obesity ([name])   . Mural thrombus of heart    coumadin  . Nonischemic cardiomyopathy ([name])    a) 12/17/11 echo: LVEF 40-34%, grade 3 diastolic dysfunction (c/w restriction), mod MR, mod LA/LV and mild RA dilatation; b) 12/18/11 cMRI: LVEF 38%, mod LV/mild RV dilatation, global HK, mild-mod RV sys dysfxn, no LV thrombus & patchy non-subendocardial delayed enhancement c/w infil dz or prior myocarditis; c. 07/2018 EF 25-30%.  . Paroxysmal SVT (supraventricular tachycardia) ([place])   . Pulmonary  embolism ([place])    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    Past Surgical History:  Procedure Laterality Date  . A-FLUTTER ABLATION N/A 11/17/2016   Procedure: A-FLUTTER ABLATION;  Surgeon: Constance Haw, MD;  Location: [place] CV LAB;  Service: Cardiovascular;  Laterality: N/A;  . CARDIAC CATHETERIZATION  06/2006   Angiographically normal cors  . CARDIAC CATHETERIZATION  12/22/2011   R/LHC: normal cors, well-compensated HDs, LV dysfxn  . CARDIOVERSION N/A 10/17/2016   Procedure: CARDIOVERSION;  Surgeon: Jolaine Artist,  MD;  Location: [facility] ENDOSCOPY;  Service: Cardiovascular;  Laterality: N/A;  . CARDIOVERSION N/A 03/20/2017   Procedure: CARDIOVERSION;  Surgeon: Jolaine Artist, MD;  Location: [facility] ENDOSCOPY;  Service: Cardiovascular;  Laterality: N/A;  . ICD IMPLANT N/A 09/03/2018   Procedure: ICD IMPLANT;  Surgeon: Constance Haw, MD;  Location: [place] CV LAB;  Service: Cardiovascular;  Laterality: N/A;  . LEFT AND RIGHT HEART CATHETERIZATION WITH CORONARY ANGIOGRAM N/A 12/22/2011   Procedure: LEFT AND RIGHT HEART CATHETERIZATION WITH CORONARY ANGIOGRAM;  Surgeon: Jolaine Artist, MD;  Location: [facility] CATH LAB;  Service: Cardiovascular;  Laterality: N/A;  . PATELLAR TENDON REPAIR     Left  . RIGHT HEART CATH N/A 10/20/2016   Procedure: RIGHT HEART CATH;  Surgeon: Jolaine Artist, MD;  Location: [place] CV LAB;  Service: Cardiovascular;  Laterality: N/A;  . TEE WITHOUT CARDIOVERSION N/A 03/20/2017   Procedure: TRANSESOPHAGEAL ECHOCARDIOGRAM (TEE);  Surgeon: Jolaine Artist, MD;  Location: [facility] ENDOSCOPY;  Service: Cardiovascular;  Laterality: N/A;    Social History:  reports that he has never smoked. He has never used smokeless tobacco. He reports that he does not drink alcohol or use drugs.  Allergies:  Allergies  Allergen Reactions  . Shrimp [Shellfish Allergy] Hives and Itching    Medications Prior to Admission  Medication Sig Dispense Refill  . acetaminophen (TYLENOL) 650 MG CR tablet Take 1,300 mg by mouth every 8 (eight) hours as needed for pain.    [name] amiodarone (PACERONE) 200 MG tablet Take 1 tablet (200 mg total) by mouth daily. 30 tablet 3  . coconut oil OIL Apply 1 application topically as needed (dry skin).     [name] digoxin (LANOXIN) 0.125 MG tablet TAKE 1 TABLET(0.125 MG) BY MOUTH DAILY (Patient taking differently: Take 0.125 mg by mouth daily. ) 90 tablet 1  . ELIQUIS 5 MG TABS tablet TAKE 1 TABLET(5 MG) BY MOUTH TWICE DAILY (Patient taking differently: Take 5 mg by mouth 2  (two) times daily. ) 60 tablet 11  . ENTRESTO 49-51 MG TAKE 1 TABLET BY MOUTH TWICE DAILY (Patient taking differently: Take 1 tablet by mouth 2 (two) times daily. ) 180 tablet 2  . potassium chloride SA (KLOR-CON) 20 MEQ tablet Take 2 tablets (40 mEq total) by mouth 2 (two) times daily. 120 tablet 3  . spironolactone (ALDACTONE) 25 MG tablet TAKE 1 TABLET(25 MG) BY MOUTH DAILY (Patient taking differently: Take 25 mg by mouth daily. ) 90 tablet 1  . torsemide (DEMADEX) 20 MG tablet TAKE 2 TABLETS BY MOUTH TWICE DAILY. STOP LASIX (Patient taking differently: Take 40 mg by mouth 2 (two) times daily. ) 120 tablet 5    Blood pressure 102/65, pulse 67, temperature 98.7 F (37.1 C), temperature source Oral, resp. rate 20, height '5\' 8"'  (1.727 m), weight (!) 151 kg, SpO2 91 %. Physical Exam: Physical Exam Constitutional:      Appearance: He is well-developed. He is obese.  Comments: BMI 50.63  HENT:     Head: Normocephalic and atraumatic.     Mouth/Throat:     Mouth: Mucous membranes are moist.  Eyes:     General: No scleral icterus.    Comments: Pupils are equal  Cardiovascular:     Rate and Rhythm: Normal rate. Rhythm irregular.     Heart sounds: Normal heart sounds.  Pulmonary:     Effort: Pulmonary effort is normal.     Breath sounds: Normal breath sounds.  Abdominal:     General: Abdomen is protuberant.     Palpations: Abdomen is soft.     Tenderness: There is abdominal tenderness (Pains primarily right lower quadrant) in the right upper quadrant and right lower quadrant.     Hernia: No hernia is present.  Skin:    General: Skin is warm and dry.     Capillary Refill: Capillary refill takes 2 to 3 seconds.  Neurological:     General: No focal deficit present.     Mental Status: He is alert and oriented to person, place, and time.     Cranial Nerves: No cranial nerve deficit.  Psychiatric:        Mood and Affect: Mood normal. Mood is not anxious or depressed.        Behavior:  Behavior normal.     Results for orders placed or performed during the hospital encounter of 12/19/18 (from the past 48 hour(s))  Comprehensive metabolic panel     Status: Abnormal   Collection Time: 12/21/18 12:41 AM  Result Value Ref Range   Sodium 140 135 - 145 mmol/L   Potassium 3.7 3.5 - 5.1 mmol/L   Chloride 99 98 - 111 mmol/L   CO2 28 22 - 32 mmol/L   Glucose, Bld 107 (H) 70 - 99 mg/dL   BUN 16 6 - 20 mg/dL   Creatinine, Ser 1.41 (H) 0.61 - 1.24 mg/dL   Calcium 8.0 (L) 8.9 - 10.3 mg/dL   Total Protein 6.6 6.5 - 8.1 g/dL   Albumin 2.7 (L) 3.5 - 5.0 g/dL   AST 18 15 - 41 U/L   ALT 12 0 - 44 U/L   Alkaline Phosphatase 38 38 - 126 U/L   Total Bilirubin 0.8 0.3 - 1.2 mg/dL   GFR calc non Af Amer 56 (L) >60 mL/min   GFR calc Af Amer >60 >60 mL/min   Anion gap 13 5 - 15    Comment: Performed at [facility], [address], [place], [place] [address]  CBC     Status: Abnormal   Collection Time: 12/21/18 12:41 AM  Result Value Ref Range   WBC 16.2 (H) 4.0 - 10.5 K/uL   RBC 5.06 4.22 - 5.81 MIL/uL   Hemoglobin 14.8 13.0 - 17.0 g/dL   HCT 45.7 39.0 - 52.0 %   MCV 90.3 80.0 - 100.0 fL   MCH 29.2 26.0 - 34.0 pg   MCHC 32.4 30.0 - 36.0 g/dL   RDW 13.4 11.5 - 15.5 %   Platelets 178 150 - 400 K/uL   nRBC 0.0 0.0 - 0.2 %    Comment: Performed at [facility], [place] [address] [place], [place] [address]  HIV Antibody (routine testing w rflx)     Status: None   Collection Time: 12/21/18 12:41 AM  Result Value Ref Range   HIV Screen 4th Generation wRfx NON REACTIVE NON REACTIVE    Comment: Performed at [facility]  Lab, [address] [place], [place] [address]  CBC with Differential     Status: Abnormal   Collection Time: 12/22/18  2:47 AM  Result Value Ref Range   WBC 15.9 (H) 4.0 - 10.5 K/uL   RBC 5.06 4.22 - 5.81 MIL/uL   Hemoglobin 14.6 13.0 - 17.0 g/dL   HCT 45.0 39.0 - 52.0 %   MCV 88.9 80.0 - 100.0 fL   MCH 28.9 26.0 - 34.0 pg   MCHC 32.4 30.0 - 36.0  g/dL   RDW 13.4 11.5 - 15.5 %   Platelets 168 150 - 400 K/uL   nRBC 0.0 0.0 - 0.2 %   Neutrophils Relative % 76 %   Neutro Abs 12.1 (H) 1.7 - 7.7 K/uL   Lymphocytes Relative 10 %   Lymphs Abs 1.6 0.7 - 4.0 K/uL   Monocytes Relative 12 %   Monocytes Absolute 1.8 (H) 0.1 - 1.0 K/uL   Eosinophils Relative 1 %   Eosinophils Absolute 0.2 0.0 - 0.5 K/uL   Basophils Relative 0 %   Basophils Absolute 0.1 0.0 - 0.1 K/uL   Immature Granulocytes 1 %   Abs Immature Granulocytes 0.14 (H) 0.00 - 0.07 K/uL    Comment: Performed at [facility] [address] [place], [place] [address]  Basic metabolic panel     Status: Abnormal   Collection Time: 12/22/18  2:47 AM  Result Value Ref Range   Sodium 135 135 - 145 mmol/L   Potassium 3.4 (L) 3.5 - 5.1 mmol/L   Chloride 97 (L) 98 - 111 mmol/L   CO2 26 22 - 32 mmol/L   Glucose, Bld 98 70 - 99 mg/dL   BUN 17 6 - 20 mg/dL   Creatinine, Ser 1.51 (H) 0.61 - 1.24 mg/dL   Calcium 8.2 (L) 8.9 - 10.3 mg/dL   GFR calc non Af Amer 52 (L) >60 mL/min   GFR calc Af Amer >60 >60 mL/min   Anion gap 12 5 - 15    Comment: Performed at [facility] [address] [place], [place] [address]   No results found. . sodium chloride Stopped (12/20/18 1319)  . piperacillin-tazobactam (ZOSYN)  IV 3.375 g (12/22/18 0515)     Assessment/Plan CHF EF 25-30% Atrial fibrillation Hx DVT/PE LV mural thrombus Gross anticoagulant Morbid obesity BMI 50.63  Acute abdominal pain/constipation Diverticulitis versus stricture versus neoplasm  FEN: Clear liquids ID: Zosyn 12/6 >> day 4 DVT: Apixaban last dose 8:51 AM 12/22/2018 Follow-up: TBD Plan: Agree with current treatments, will follow, and await flexible sigmoidoscopy findings.  Earnstine Regal [facility] Surgery 12/22/2018, 10:21 AM Please see Amion for pager number during day hours 7:00am-4:30pm

## 2018-12-22 NOTE — Progress Notes (Signed)
ANTICOAGULATION CONSULT NOTE - Follow Up Consult  Pharmacy Consult for Apixaban to Heparin Indication: pulmonary embolus / DVT history  Allergies  Allergen Reactions  . Shrimp [Shellfish Allergy] Hives and Itching    Patient Measurements: Height: 5\' 8"  (172.7 cm) Weight: (!) 333 lb (151 kg) IBW/kg (Calculated) : 68.4 Heparin Dosing Weight: 107 kg  Vital Signs: Temp: 99.1 F (37.3 C) (12/09 1329) Temp Source: Oral (12/09 1329) BP: 93/60 (12/09 1329) Pulse Rate: 84 (12/09 1329)  Labs: Recent Labs    12/20/18 1020 12/21/18 0041 12/22/18 0247  HGB 14.7 14.8 14.6  HCT 46.5 45.7 45.0  PLT 182 178 168  CREATININE 1.52* 1.41* 1.51*    Estimated Creatinine Clearance: 81.1 mL/min (A) (by C-G formula based on SCr of 1.51 mg/dL (H)).   Assessment: 53 year old male to begin heparin while apixaban on hold in preparation for surgery Last apixaban dose this AM Will use PTTs to dose heparin until heparin level and PTT correlate  Goal of Therapy:  Heparin level 0.3-0.7 units/ml Monitor platelets by anticoagulation protocol: Yes  PTT = 66 to 102 seconds   Plan:  Heparin drip at 1500 units / hr starting at 9 pm Daily heparin level, CBC, PTT  Thank you Anette Guarneri, PharmD  12/22/2018,2:44 PM

## 2018-12-22 NOTE — Progress Notes (Signed)
          Daily Rounding Note  12/22/2018, 9:34 AM  LOS: 2 days   SUBJECTIVE:   Chief complaint: Abdominal pain with what looks like distal descending colon obstruction. Pain and tight feeling persist in the right abdomen.  Pain mostly controlled with oral oxycodone.  Not passing much flatus and has not had any bowel movements.  No nausea or vomiting and tolerating clear liquids.  OBJECTIVE:         Vital signs in last 24 hours:    Temp:  [98.7 F (37.1 C)-100 F (37.8 C)] 98.7 F (37.1 C) (12/09 0438) Pulse Rate:  [67-82] 67 (12/09 0438) Resp:  [20-22] 20 (12/09 0438) BP: (102-106)/(65-74) 102/65 (12/09 0438) SpO2:  [91 %-95 %] 91 % (12/09 0438) Last BM Date: 12/14/18 Filed Weights   12/19/18 1957  Weight: (!) 151 kg   General: Pleasant, nonill appearing, comfortable, obese. Heart: RRR. Chest: Clear bilaterally, no labored breathing.  Gynecomastia. Abdomen: Examined while the patient was sitting up on the side of the bed.  Abdomen is tense, tender without guarding or rebound on the right.  Scant bowel sounds but no tinkling or tympanic bowel sounds. Extremities: No CCE. Neuro/Psych: Oriented x3, fully alert.  No deficits, weakness, tremors.  Intake/Output from previous day: 12/08 0701 - 12/09 0700 In: 530 [P.O.:480; IV Piggyback:50] Out: 925 [Urine:925]  Intake/Output this shift: Total I/O In: -  Out: 500 [Urine:500]  Lab Results: Recent Labs    12/20/18 1020 12/21/18 0041 12/22/18 0247  WBC 16.6* 16.2* 15.9*  HGB 14.7 14.8 14.6  HCT 46.5 45.7 45.0  PLT 182 178 168   BMET Recent Labs    12/20/18 1020 12/21/18 0041 12/22/18 0247  NA 141 140 135  K 3.7 3.7 3.4*  CL 102 99 97*  CO2 28 28 26   GLUCOSE 131* 107* 98  BUN 20 16 17   CREATININE 1.52* 1.41* 1.51*  CALCIUM 8.2* 8.0* 8.2*   LFT Recent Labs    12/19/18 2038 12/21/18 0041  PROT 7.7 6.6  ALBUMIN 3.2* 2.7*  AST 21 18  ALT 13 12  ALKPHOS 45  38  BILITOT 0.9 0.8    Studies/Results: No results found.  ASSESMENT:   *   Acute right abdominal pain with constipation/obstipation.  Transition zone at distal descending colon, ?  Diverticulitis vs stricture vs neoplasia?  Day 4 Empiric Zosyn. WBCs 19.7 >> 15.9 Pain well controlled with po oxycodone.   *    Chronic Eliquis, not on hold.  Last dose 0850 this morning 12/9.  Indications: Remote DVT/PE 2007, cardiac mural thrombus.  *   Hypokalemia, minor.  K 3.4.   *   CHF.  LVEF 25 to 30% on 07/2018 echo.  Compensated.     *   Covid 19 negative.      PLAN   *   Unprepped Flex sig tomorrow?   Clears today.  Npo after midnight.  Dr Cathlean Sauer is ordering Dulcolax PR and oral potassium.      Azucena Freed  12/22/2018, 9:34 AM Phone [phone]

## 2018-12-22 NOTE — H&P (View-Only) (Signed)
          Daily Rounding Note  12/22/2018, 9:34 AM  LOS: 2 days   SUBJECTIVE:   Chief complaint: Abdominal pain with what looks like distal descending colon obstruction. Pain and tight feeling persist in the right abdomen.  Pain mostly controlled with oral oxycodone.  Not passing much flatus and has not had any bowel movements.  No nausea or vomiting and tolerating clear liquids.  OBJECTIVE:         Vital signs in last 24 hours:    Temp:  [98.7 F (37.1 C)-100 F (37.8 C)] 98.7 F (37.1 C) (12/09 0438) Pulse Rate:  [67-82] 67 (12/09 0438) Resp:  [20-22] 20 (12/09 0438) BP: (102-106)/(65-74) 102/65 (12/09 0438) SpO2:  [91 %-95 %] 91 % (12/09 0438) Last BM Date: 12/14/18 Filed Weights   12/19/18 1957  Weight: (!) 151 kg   General: Pleasant, nonill appearing, comfortable, obese. Heart: RRR. Chest: Clear bilaterally, no labored breathing.  Gynecomastia. Abdomen: Examined while the patient was sitting up on the side of the bed.  Abdomen is tense, tender without guarding or rebound on the right.  Scant bowel sounds but no tinkling or tympanic bowel sounds. Extremities: No CCE. Neuro/Psych: Oriented x3, fully alert.  No deficits, weakness, tremors.  Intake/Output from previous day: 12/08 0701 - 12/09 0700 In: 530 [P.O.:480; IV Piggyback:50] Out: 925 [Urine:925]  Intake/Output this shift: Total I/O In: -  Out: 500 [Urine:500]  Lab Results: Recent Labs    12/20/18 1020 12/21/18 0041 12/22/18 0247  WBC 16.6* 16.2* 15.9*  HGB 14.7 14.8 14.6  HCT 46.5 45.7 45.0  PLT 182 178 168   BMET Recent Labs    12/20/18 1020 12/21/18 0041 12/22/18 0247  NA 141 140 135  K 3.7 3.7 3.4*  CL 102 99 97*  CO2 28 28 26  GLUCOSE 131* 107* 98  BUN 20 16 17  CREATININE 1.52* 1.41* 1.51*  CALCIUM 8.2* 8.0* 8.2*   LFT Recent Labs    12/19/18 2038 12/21/18 0041  PROT 7.7 6.6  ALBUMIN 3.2* 2.7*  AST 21 18  ALT 13 12  ALKPHOS 45  38  BILITOT 0.9 0.8    Studies/Results: No results found.  ASSESMENT:   *   Acute right abdominal pain with constipation/obstipation.  Transition zone at distal descending colon, ?  Diverticulitis vs stricture vs neoplasia?  Day 4 Empiric Zosyn. WBCs 19.7 >> 15.9 Pain well controlled with po oxycodone.   *    Chronic Eliquis, not on hold.  Last dose 0850 this morning 12/9.  Indications: Remote DVT/PE 2007, cardiac mural thrombus.  *   Hypokalemia, minor.  K 3.4.   *   CHF.  LVEF 25 to 30% on 07/2018 echo.  Compensated.     *   Covid 19 negative.      PLAN   *   Unprepped Flex sig tomorrow?   Clears today.  Npo after midnight.  Dr Arrien is ordering Dulcolax PR and oral potassium.      Roberto [name]  12/22/2018, 9:34 AM Phone [phone] 

## 2018-12-22 NOTE — Progress Notes (Addendum)
PROGRESS NOTE    EXCELL NEYLAND  TWS:[id number] DOB: August 14, 1965 DOA: 12/19/2018 PCP: [name], MD    Brief Narrative:  53 year old male who presented with right lower quadrant abdominal pain.  He does have significant past medical history for chronic diastolic heart failure, ischemic cardiomyopathy ejection fraction 25 to 30%, deep vein thrombosis, pulmonary embolism, paroxysmal atrial fibrillation, lupus anticoagulant and morbid obesity. His pain has been constant, 7 out of 10 in intensity.  Failed outpatient therapy with symptomatic control.  On his initial physical examination blood pressure 99/54, temperature 98, respiratory rate 24, pulse rate 67, oxygen saturation 93%.  His lungs were clear to auscultation bilaterally, heart S1-S2 present rhythmic, his abdomen had tenderness in the mid abdomen, no guarding or rebound, no lower extremity edema. Sodium 140, potassium 3.6, chloride 101, bicarb 20, glucose 122, BUN 17, creatinine 1.42, white count 19.7, hemoglobin 15.9, crit 50.3, platelets 221.  SARS COVID-19 was negative.  Urinalysis with no white cells, specific gravity more than 1.030, more than 300 protein. CT of the abdomen and pelvis with dilated fluid-filled colon from cecal tip to distal descending colon, abrupt transition in caliber extent defined, possible diverticulitis rule out stricture or mass.  Clinically surgical history minute, normal axis, normal intervals, positive PACs and PVCs, low voltage, no ST segment or T wave changes.   Patient was admitted to the hospital with a working diagnosis of diverticulitis.  Patient was seen by gastroenterology, possible endoscopic procedure.  Assessment & Plan:   Principal Problem:   Acute diverticulitis Active Problems:   Obesity, morbid (HCC)   Nonischemic cardiomyopathy (HCC)   Paroxysmal SVT (supraventricular tachycardia) (HCC)   Chronic systolic heart failure (HCC)   Chronic pain of right knee   History of DVT (deep vein  thrombosis)   PAF (paroxysmal atrial fibrillation) (HCC)   1. Acute diverticulitis, complicated with intraluminal stenosis. Patient continue with no bowel movement. Positive abdominal distention. No nausea or vomiting and tolerating po. Will add dulcolax suppository x2, and will follow on GI recommendations, for possible flex on this admission. Continue ppi therapy. Antibiotic therapy with Zosyn.   Surgery recommendation for complete NPO for now.   2. Non ischemic cardiomyopathy with EF 25 to 30% sp AICD 08/20. Patient clinically euvolemic. Tolerating po well, continue to hold on IV fluids for now. Continue heart failure management with digoxin. Will hold on diuretic therapy for now. Continue with amiodarone.   3. AKI with hypokalemia. Renal function has been stable, will continue to hold on diuretic therapy, patient with poor oral intake, will follow on renal panel in am. Avoid hypotension and nephrotoxic medications. K correction with Kcl.   4. Lupus anticoagulant with DVT, PE and LV mural thrombus. Will avoid patient  to be off anticoagulation, in the setting of possible surgical and endoscopic procedures during this hospitalization will order heparin for bridging due to high risk thrombosis.   5. Morbid obesity. Calculated BMI is 50    DVT prophylaxis: apixaban   Code Status:  full Family Communication: no family at the bedside  Disposition Plan/ discharge barriers:  Pending clinical improvement.   Body mass index is 50.63 kg/m. Malnutrition Type:      Malnutrition Characteristics:      Nutrition Interventions:     RN Pressure Injury Documentation:     Consultants:   GI   Procedures:      Antimicrobials:       Subjective: Patient continue to have abdominal distention and pain, no nausea or  vomiting, has no bowel movement for a few days, not passing much gas, no dyspnea or chest pain.   Objective: Vitals:   12/21/18 0424 12/21/18 1445 12/21/18 1954  12/22/18 0438  BP: 104/78 106/74 105/65 102/65  Pulse: 74 82 78 67  Resp: 16  (!) 22 20  Temp: 99 F (37.2 C) 99.7 F (37.6 C) 100 F (37.8 C) 98.7 F (37.1 C)  TempSrc: Oral Oral Oral Oral  SpO2: 95% 94% 95% 91%  Weight:      Height:        Intake/Output Summary (Last 24 hours) at 12/22/2018 0933 Last data filed at 12/22/2018 0854 Gross per 24 hour  Intake 530 ml  Output 1425 ml  Net -895 ml   Filed Weights   12/19/18 1957  Weight: (!) 151 kg    Examination:   General: deconditioned  Neurology: Awake and alert, non focal  E ENT: mild pallor, no icterus, oral mucosa moist Cardiovascular: No JVD. S1-S2 present, rhythmic, no gallops, rubs, or murmurs. No lower extremity edema. Pulmonary; Positive breath sounds bilaterally, adequate air movement, no wheezing, rhonchi or rales. Gastrointestinal. Abdomen distended, tender to deep palpation at the right lower quadrant, with no organomegaly, no rebound or guarding Skin. No rashes Musculoskeletal: no joint deformities     Data Reviewed: I have personally reviewed following labs and imaging studies  CBC: Recent Labs  Lab 12/19/18 2038 12/20/18 1020 12/21/18 0041 12/22/18 0247  WBC 19.7* 16.6* 16.2* 15.9*  NEUTROABS 16.9* 13.8*  --  12.1*  HGB 15.9 14.7 14.8 14.6  HCT 50.3 46.5 45.7 45.0  MCV 91.1 91.5 90.3 88.9  PLT 221 182 178 168   Basic Metabolic Panel: Recent Labs  Lab 12/19/18 2038 12/20/18 1020 12/21/18 0041 12/22/18 0247  NA 140 141 140 135  K 3.6 3.7 3.7 3.4*  CL 101 102 99 97*  CO2 28 28 28 26   GLUCOSE 122* 131* 107* 98  BUN 17 20 16 17   CREATININE 1.42* 1.52* 1.41* 1.51*  CALCIUM 8.6* 8.2* 8.0* 8.2*   GFR: Estimated Creatinine Clearance: 81.1 mL/min (A) (by C-G formula based on SCr of 1.51 mg/dL (H)). Liver Function Tests: Recent Labs  Lab 12/19/18 2038 12/21/18 0041  AST 21 18  ALT 13 12  ALKPHOS 45 38  BILITOT 0.9 0.8  PROT 7.7 6.6  ALBUMIN 3.2* 2.7*   Recent Labs  Lab 12/19/18  2038  LIPASE 27   No results for input(s): AMMONIA in the last 168 hours. Coagulation Profile: No results for input(s): INR, PROTIME in the last 168 hours. Cardiac Enzymes: No results for input(s): CKTOTAL, CKMB, CKMBINDEX, TROPONINI in the last 168 hours. BNP (last 3 results) No results for input(s): PROBNP in the last 8760 hours. HbA1C: No results for input(s): HGBA1C in the last 72 hours. CBG: No results for input(s): GLUCAP in the last 168 hours. Lipid Profile: No results for input(s): CHOL, HDL, LDLCALC, TRIG, CHOLHDL, LDLDIRECT in the last 72 hours. Thyroid Function Tests: No results for input(s): TSH, T4TOTAL, FREET4, T3FREE, THYROIDAB in the last 72 hours. Anemia Panel: No results for input(s): VITAMINB12, FOLATE, FERRITIN, TIBC, IRON, RETICCTPCT in the last 72 hours.    Radiology Studies: I have reviewed all of the imaging during this hospital visit personally     Scheduled Meds: . amiodarone  200 mg Oral Daily  . [START ON 12/24/2018] apixaban  5 mg Oral BID  . digoxin  0.125 mg Oral Daily  . influenza vac split quadrivalent PF  0.5 mL Intramuscular Tomorrow-1000  . spironolactone  25 mg Oral Daily  . torsemide  40 mg Oral BID   Continuous Infusions: . sodium chloride Stopped (12/20/18 1319)  . piperacillin-tazobactam (ZOSYN)  IV 3.375 g (12/22/18 0515)     LOS: 2 days        Ahaan Zobrist Gerome Apley, MD

## 2018-12-22 NOTE — Anesthesia Preprocedure Evaluation (Addendum)
Anesthesia Evaluation  Patient identified by MRN, date of birth, ID band Patient awake    Reviewed: Allergy & Precautions, NPO status , Patient's Chart, lab work & pertinent test results  Airway Mallampati: III  TM Distance: >3 FB Neck ROM: Full    Dental no notable dental hx. (+) Teeth Intact, Dental Advisory Given   Pulmonary neg pulmonary ROS,    Pulmonary exam normal breath sounds clear to auscultation       Cardiovascular +CHF and + DVT  Normal cardiovascular exam+ dysrhythmias Atrial Fibrillation + Cardiac Defibrillator  Rhythm:Regular Rate:Normal  EF 25%-35   Neuro/Psych negative neurological ROS  negative psych ROS   GI/Hepatic Neg liver ROS,   Endo/Other  negative endocrine ROS  Renal/GU      Musculoskeletal negative musculoskeletal ROS (+)   Abdominal (+) + obese,   Peds  Hematology   Anesthesia Other Findings   Reproductive/Obstetrics                            Anesthesia Physical Anesthesia Plan  ASA: III  Anesthesia Plan: MAC   Post-op Pain Management:    Induction: Intravenous  PONV Risk Score and Plan: Treatment may vary due to age or medical condition  Airway Management Planned: Nasal Cannula and Natural Airway  Additional Equipment: None  Intra-op Plan:   Post-operative Plan:   Informed Consent: I have reviewed the patients History and Physical, chart, labs and discussed the procedure including the risks, benefits and alternatives for the proposed anesthesia with the patient or authorized representative who has indicated his/her understanding and acceptance.     Dental advisory given  Plan Discussed with:   Anesthesia Plan Comments: (Hx of diverticulosis)       Anesthesia Quick Evaluation

## 2018-12-23 ENCOUNTER — Inpatient Hospital Stay (HOSPITAL_COMMUNITY): Payer: BC Managed Care – PPO | Admitting: Anesthesiology

## 2018-12-23 ENCOUNTER — Encounter (HOSPITAL_COMMUNITY): Payer: Self-pay | Admitting: Internal Medicine

## 2018-12-23 ENCOUNTER — Encounter (HOSPITAL_COMMUNITY)
Admission: EM | Disposition: A | Discharge status: Skilled Nursing Facility | Payer: Self-pay | Source: Home / Self Care | Attending: Internal Medicine

## 2018-12-23 HISTORY — PX: BIOPSY: SHX5522

## 2018-12-23 HISTORY — PX: FLEXIBLE SIGMOIDOSCOPY: SHX5431

## 2018-12-23 LAB — BASIC METABOLIC PANEL
Anion gap: 14 (ref 5–15)
BUN: 27 mg/dL — ABNORMAL HIGH (ref 6–20)
CO2: 28 mmol/L (ref 22–32)
Calcium: 8.4 mg/dL — ABNORMAL LOW (ref 8.9–10.3)
Chloride: 97 mmol/L — ABNORMAL LOW (ref 98–111)
Creatinine, Ser: 1.81 mg/dL — ABNORMAL HIGH (ref 0.61–1.24)
GFR calc Af Amer: 48 mL/min — ABNORMAL LOW (ref >60)
GFR calc non Af Amer: 42 mL/min — ABNORMAL LOW (ref >60)
Glucose, Bld: 122 mg/dL — ABNORMAL HIGH (ref 70–99)
Potassium: 3.8 mmol/L (ref 3.5–5.1)
Sodium: 139 mmol/L (ref 135–145)

## 2018-12-23 LAB — APTT
aPTT: 40 seconds — ABNORMAL HIGH (ref 24–36)
aPTT: 38 seconds — ABNORMAL HIGH (ref 24–36)

## 2018-12-23 LAB — CBC
HCT: 48.2 % (ref 39.0–52.0)
Hemoglobin: 15.5 g/dL (ref 13.0–17.0)
MCH: 29.1 pg (ref 26.0–34.0)
MCHC: 32.2 g/dL (ref 30.0–36.0)
MCV: 90.6 fL (ref 80.0–100.0)
Platelets: 212 10*3/uL (ref 150–400)
RBC: 5.32 MIL/uL (ref 4.22–5.81)
RDW: 13.3 % (ref 11.5–15.5)
WBC: 15.6 10*3/uL — ABNORMAL HIGH (ref 4.0–10.5)
nRBC: 0 % (ref 0.0–0.2)

## 2018-12-23 LAB — HEPARIN LEVEL (UNFRACTIONATED)
Heparin Unfractionated: >2.2 IU/mL — ABNORMAL HIGH (ref 0.30–0.70)
Heparin Unfractionated: 1.78 IU/mL — ABNORMAL HIGH (ref 0.30–0.70)

## 2018-12-23 SURGERY — SIGMOIDOSCOPY, FLEXIBLE
Anesthesia: Monitor Anesthesia Care

## 2018-12-23 MED ORDER — SODIUM CHLORIDE 0.9 % IV SOLN
INTRAVENOUS | Status: DC
  Administered 2018-12-23: 16:00:00 via INTRAVENOUS

## 2018-12-23 MED ORDER — PROPOFOL 500 MG/50ML IV EMUL
INTRAVENOUS | Status: DC | PRN
  Administered 2018-12-23: 100 ug/kg/min via INTRAVENOUS

## 2018-12-23 MED ORDER — PROPOFOL 10 MG/ML IV BOLUS
INTRAVENOUS | Status: DC | PRN
  Administered 2018-12-23: 30 ug via INTRAVENOUS
  Administered 2018-12-23: 20 mg via INTRAVENOUS

## 2018-12-23 MED ORDER — PHENYLEPHRINE HCL-NACL 10-0.9 MG/250ML-% IV SOLN
INTRAVENOUS | Status: DC | PRN
  Administered 2018-12-23: 50 ug/min via INTRAVENOUS

## 2018-12-23 MED ORDER — LACTATED RINGERS IV SOLN
INTRAVENOUS | Status: DC | PRN
  Administered 2018-12-23: 09:00:00 via INTRAVENOUS

## 2018-12-23 MED ORDER — LIDOCAINE 2% (20 MG/ML) 5 ML SYRINGE
INTRAMUSCULAR | Status: DC | PRN
  Administered 2018-12-23: 100 mg via INTRAVENOUS

## 2018-12-23 MED ORDER — PHENYLEPHRINE 40 MCG/ML (10ML) SYRINGE FOR IV PUSH (FOR BLOOD PRESSURE SUPPORT)
PREFILLED_SYRINGE | INTRAVENOUS | Status: DC | PRN
  Administered 2018-12-23: 120 ug via INTRAVENOUS
  Administered 2018-12-23: 80 ug via INTRAVENOUS

## 2018-12-23 NOTE — Interval H&P Note (Signed)
History and Physical Interval Note:  12/23/2018 8:42 AM  Roberto [name]  has presented today for surgery, with the diagnosis of descebnding colon obstruction.  The various methods of treatment have been discussed with the patient and family. After consideration of risks, benefits and other options for treatment, the patient has consented to  Procedure(s): FLEXIBLE SIGMOIDOSCOPY (N/A) as a surgical intervention.  [name] history has been reviewed, patient examined, no change in status, stable for surgery.  I have reviewed the patient's chart and labs.  Questions were answered to the patient's satisfaction.    This procedure is considered a high risk attempt at evaluation of presumed colonic stenosis/narrowing which could be an underlying malignancy versus diverticulitis and inflammatory stricture.  Patient understands increased risk for perforation with this particular procedure.  He elects to move forward with this.  We have our surgical colleagues available on backup if issues arise.   [employer]

## 2018-12-23 NOTE — Transfer of Care (Signed)
Immediate Anesthesia Transfer of Care Note  Patient: Roberto [name]  Procedure(s) Performed: FLEXIBLE SIGMOIDOSCOPY (N/A ) BIOPSY  Patient Location: Endoscopy Unit  Anesthesia Type:MAC  Level of Consciousness: awake, oriented and patient cooperative  Airway & Oxygen Therapy: Patient Spontanous Breathing and Patient connected to face mask oxygen  Post-op Assessment: Report given to RN and Post -op Vital signs reviewed and stable  Post vital signs: Reviewed  Last Vitals:  Vitals Value Taken Time  BP 113/79 12/23/18 1038  Temp 36.6 C 12/23/18 1038  Pulse 73 12/23/18 1041  Resp 16 12/23/18 1041  SpO2 92 % 12/23/18 1041  Vitals shown include unvalidated device data.  Last Pain:  Vitals:   12/23/18 1038  TempSrc: Oral  PainSc: 0-No pain      Patients Stated Pain Goal: 3 (07/13/14 0109)  Complications: No apparent anesthesia complications

## 2018-12-23 NOTE — Progress Notes (Signed)
ANTICOAGULATION CONSULT NOTE - Follow Up Consult  Pharmacy Consult for heparin Indication: h/o PE/DVT  Labs: Recent Labs    12/21/18 0041 12/22/18 0247 12/23/18 0500 12/23/18 2220  HGB 14.8 14.6 15.5  --   HCT 45.7 45.0 48.2  --   PLT 178 168 212  --   APTT  --   --  40* 38*  HEPARINUNFRC  --   --  >2.20* 1.78*  CREATININE 1.41* 1.51* 1.81*  --     Assessment: 53yo male subtherapeutic on heparin with initial dosing while Eliquis on hold; no gtt issues or signs of bleeding per RN.  Goal of Therapy:  aPTT 66-102 seconds   Plan:  Will increase heparin gtt by ~4 units/kgABW/hr to 2000 units/hr and check PTT in 6 hours.    Wynona Neat, PharmD, BCPS  12/23/2018,11:34 PM

## 2018-12-23 NOTE — Op Note (Signed)
[facility] Patient Name: Roberto [name] Procedure Date : 12/23/2018 MRN: [id number] Attending MD: Justice Britain , MD Date of Birth: 1965-09-04 CSN: [id number] Age: 53 Admit Type: Inpatient Procedure:                Flexible Sigmoidoscopy Indications:              Abnormal CT of the GI tract, Colonic obstruction,                            Change in stool caliber, Constipation Providers:                Justice Britain, MD, Josie Dixon, RN,                            William Dalton, Technician Referring MD:             [facility], Dr. Henrene Pastor Medicines:                Monitored Anesthesia Care Complications:            No immediate complications. Estimated Blood Loss:     Estimated blood loss was minimal. Procedure:                Pre-Anesthesia Assessment:                           - Prior to the procedure, a History and Physical                            was performed, and patient medications and                            allergies were reviewed. [name] tolerance of                            previous anesthesia was also reviewed. The risks                            and benefits of the procedure and the sedation                            options and risks were discussed with the patient.                            All questions were answered, and informed consent                            was obtained. Prior Anticoagulants: The patient has                            taken Eliquis (apixaban), last dose was 1 day prior                            to procedure. ASA Grade Assessment: III - A patient  with severe systemic disease. After reviewing the                            risks and benefits, the patient was deemed in                            satisfactory condition to undergo the procedure.                           After obtaining informed consent, the scope was                            passed under direct  vision. The GIF-H190 ([id number])                            Olympus gastroscope was introduced through the anus                            and advanced to the the rectum. The PCF-PH190L                            ([id number]) Olympus ultra slim colonoscope was                            introduced through the and advanced to the. The                            flexible sigmoidoscopy was accomplished without                            difficulty. The patient tolerated the procedure.                            The quality of the bowel preparation was inadequate. Scope In: 9:33:45 AM Scope Out: 10:23:34 AM Total Procedure Duration: 0 hours 49 minutes 49 seconds  Findings:      The digital rectal exam findings include hemorrhoids. Pertinent       negatives include no palpable rectal lesions.      A large amount of semi-liquid stool was found in the entire visualized       colon, precluding visualization. Lavage of the area was performed using       copious amounts, resulting in incomplete clearance with fair though       still inadquate overall visualization for polyps.      A 8 mm polyp was found in the recto-sigmoid colon. The polyp was       semi-sessile. This was not removed due to the poor preparation.      Multiple small-mouthed diverticula were found in the recto-sigmoid colon       and sigmoid colon.      An area of significantly congested mucosa was found from 29 to 34 cm       proximal to the anus. The Ultraslim colonoscope and the endoscope were       able to traverse this region. It did not have appearance of a typical  intramucosal cancer. This was biopsied with a cold forceps for       histologypurposes however.      After the area noted above was traversed, the lumen of the descending       colon was found to be significantly dilated. Suction via Endoscope was       performed to decompress the region and we removed 1.2 L of stool. Stool       adherent to the wall was  noted..      While withdrawing back into the [facility], a presumed diverticula with purulent       discharge was seen, suspicious of diverticulitis. This is in the region       of the the congested mucosa.      Non-bleeding non-thrombosed internal hemorrhoids were found during       retroflexion, during perianal exam and during digital exam. The       hemorrhoids were Grade II (internal hemorrhoids that prolapse but reduce       spontaneously). Impression:               - Preparation of the colon was inadequate.                           - Hemorrhoids found on digital rectal exam.                           - Stool in the entire examined colon. Lavaged with                            fair though inadequate preparation overall for                            colon polyp detection.                           - One 8 mm polyp at the recto-sigmoid colon. Not                            removed due to the poor overall preparation noted                            in the colon.                           - Diverticulosis in the recto-sigmoid colon and in                            the sigmoid colon.                           - Congested mucosa from 29 to 34 cm proximal to the                            anus. Biopsied to rule out dysplasia, though does                            not have typical appearance. Purulent  discharge was                            seen in association with a presumed diverticular                            opening, suspicious of active diverticulitis. I                            suspect this is the etiology of the patient's                            issues currently.                           - Dilated in the descending colon just proximal to                            the congested mucosa. Suction via endoscope to                            decompress as much as possible with removal of at                            least 1.2 L of stool.                           - Non-bleeding  non-thrombosed internal hemorrhoids. Recommendation:           - The patient will be observed post-procedure,                            until all discharge criteria are met.                           - Return patient to hospital ward for ongoing care.                           - Await pathology results.                           - Continue IV Antibiotics at this time. If the                            patient does not "open up" in the next 2-3 days                            with continued antibiotics, I suspect that he will                            need a surgical intervention.                           - In either case of Surgical intervention or not,  patient needs a full colonoscopy at some point in                            future for polyp resection and for further colon                            polyp detection and colon cancer screening.                           - The findings and recommendations were discussed                            with the patient.                           - The findings and recommendations were discussed                            with the referring physician. Procedure Code(s):        --- Professional ---                           [id number], Sigmoidoscopy, flexible; with biopsy, single                            or multiple Diagnosis Code(s):        --- Professional ---                           K64.1, Second degree hemorrhoids                           K63.5, Polyp of colon                           K63.89, Other specified diseases of intestine                           K59.39, Other megacolon                           K56.609, Unspecified intestinal obstruction,                            unspecified as to partial versus complete                            obstruction                           R19.5, Other fecal abnormalities                           K59.00, Constipation, unspecified                           K57.30,  Diverticulosis of large intestine without  perforation or abscess without bleeding                           R93.3, Abnormal findings on diagnostic imaging of                            other parts of digestive tract CPT copyright 2019 [employer]. All rights reserved. The codes documented in this report are preliminary and upon coder review may  be revised to meet current compliance requirements. Justice Britain, MD 12/23/2018 12:00:44 PM Number of Addenda: 0

## 2018-12-23 NOTE — Anesthesia Postprocedure Evaluation (Signed)
Anesthesia Post Note  Patient: Roberto [name]  Procedure(s) Performed: FLEXIBLE SIGMOIDOSCOPY (N/A ) BIOPSY     Patient location during evaluation: Endoscopy Anesthesia Type: MAC Level of consciousness: awake and alert Pain management: pain level controlled Vital Signs Assessment: post-procedure vital signs reviewed and stable Respiratory status: spontaneous breathing, nonlabored ventilation, respiratory function stable and patient connected to nasal cannula oxygen Cardiovascular status: blood pressure returned to baseline and stable Postop Assessment: no apparent nausea or vomiting Anesthetic complications: no    Last Vitals:  Vitals:   12/23/18 0840 12/23/18 1038  BP: (!) 106/51 113/79  Pulse: 70 69  Resp: (!) 31 17  Temp: 37.2 C 36.6 C  SpO2: 92% 92%    Last Pain:  Vitals:   12/23/18 1038  TempSrc: Oral  PainSc: 0-No pain                 Barnet Glasgow

## 2018-12-23 NOTE — Progress Notes (Addendum)
PROGRESS NOTE    Roberto [name]  HQI:[id number] DOB: [birth year] DOA: 12/19/2018 PCP: Denita Lung, MD    Brief Narrative:  53 year old male who presented with right lower quadrant abdominal pain.  He does have significant past medical history for chronic diastolic heart failure, ischemic cardiomyopathy ejection fraction 25 to 30%, deep vein thrombosis, pulmonary embolism, paroxysmal atrial fibrillation, lupus anticoagulant and morbid obesity. His pain has been constant, 7 out of 10 in intensity.  Failed outpatient therapy with symptomatic control.  On his initial physical examination blood pressure 99/54, temperature 98, respiratory rate 24, pulse rate 67, oxygen saturation 93%.  His lungs were clear to auscultation bilaterally, heart S1-S2 present rhythmic, his abdomen had tenderness in the mid abdomen, no guarding or rebound, no lower extremity edema. Sodium 140, potassium 3.6, chloride 101, bicarb 20, glucose 122, BUN 17, creatinine 1.42, white count 19.7, hemoglobin 15.9, crit 50.3, platelets 221.  SARS COVID-19 was negative.  Urinalysis with no white cells, specific gravity more than 1.030, more than 300 protein. CT of the abdomen and pelvis with dilated fluid-filled colon from cecal tip to distal descending colon, abrupt transition in caliber extent defined, possible diverticulitis rule out stricture or mass.  Clinically surgical history minute, normal axis, normal intervals, positive PACs and PVCs, low voltage, no ST segment or T wave changes.   Patient was admitted to the hospital with a working diagnosis of diverticulitis.  Patient with persistent symptoms of obstruction, underwent flex sigmoidoscopy finding congested mucosa with purulent discharge, with bowel lumen narrowing.    Assessment & Plan:   Principal Problem:   Acute diverticulitis Active Problems:   Obesity, morbid (HCC)   Nonischemic cardiomyopathy (HCC)   Paroxysmal SVT (supraventricular tachycardia) (HCC)  Chronic systolic heart failure (HCC)   Chronic pain of right knee   History of DVT (deep vein thrombosis)   PAF (paroxysmal atrial fibrillation) (HCC)   Pre-operative cardiovascular examination    1. Acute diverticulitis, complicated with intraluminal stenosis. Flex sigmoidoscopy with congested mucosa 29 to 34 cm proximal from the anus, positive purulent discharge, luminal narrowing. Patient sp decompression. Clinically feeling better post procedure, but not yet back to normal. Wbc is down to 15 from 16, cultures, continue with no growth. Continue antibiotic therapy with Zosyn.   Case discussed with Dr. Rush Landmark, will plan continue medical therapy for now, but if no improvement, will need surgical intervention.   Advance diet to clears. Patient continue to have high risk of worsening infection.   2. Non ischemic cardiomyopathy with EF 25 to 30% sp AICD 08/20. Continue clinically euvolemic, heart failure management with digoxin and  amiodarone. Will continue to hold on diuretics for now.   3. AKI with hypokalemia. Worsening renal function with serum cr at 1,8 today with K at 3,8 and serum bicarbonate at 28. Will add gentle hydration isotonic saline at 50 ml per H and will follow on renal panel in am, continue to hold on diuretics.  4. Lupus anticoagulant with DVT, PE and LV mural thrombus. Continue heparin bridge while patient having GI procedures, may need surgical intervention if no response to antibiotic therapy,.  5. Morbid obesity. His calculated BMI is 50    DVT prophylaxis: apixaban   Code Status:  full Family Communication: no family at the bedside  Disposition Plan/ discharge barriers:  Pending clinical improvement.    Body mass index is 50.63 kg/m. Malnutrition Type:      Malnutrition Characteristics:      Nutrition Interventions:  RN Pressure Injury Documentation:     Consultants:   GI   Surgery   Procedures:   Felx sigmoidoscopy on 12/10   Antimicrobials:   Zosyn IV     Subjective: Patient is feeling better post endoscopic procedure, sp decompression. Pain has improved, with no nausea or vomiting. Still not yet back to his baseline.   Objective: Vitals:   12/23/18 1038 12/23/18 1050 12/23/18 1100 12/23/18 1110  BP: 113/79 116/67 117/62 124/70  Pulse: 69 66 65 65  Resp: 17 (!) 22 14 (!) 25  Temp: 97.8 F (36.6 C)     TempSrc: Oral     SpO2: 92% 92% 92% 93%  Weight:      Height:        Intake/Output Summary (Last 24 hours) at 12/23/2018 1158 Last data filed at 12/23/2018 1028 Gross per 24 hour  Intake 1612.58 ml  Output 350 ml  Net 1262.58 ml   Filed Weights   12/19/18 1957 12/23/18 0840  Weight: (!) 151 kg (!) 151 kg    Examination:   General: Not in pain or dyspnea, deconditioned  Neurology: Awake and alert, non focal  E ENT: mild pallor, no icterus, oral mucosa moist Cardiovascular: No JVD. S1-S2 present, rhythmic, no gallops, rubs, or murmurs. No lower extremity edema. Pulmonary: positive breath sounds bilaterally, adequate air movement, no wheezing, rhonchi or rales. Gastrointestinal. Abdomen distended and tender to palpation to the right lower quadrant with no organomegaly, non tender, no rebound or guarding Skin. No rashes Musculoskeletal: no joint deformities     Data Reviewed: I have personally reviewed following labs and imaging studies  CBC: Recent Labs  Lab 12/19/18 2038 12/20/18 1020 12/21/18 0041 12/22/18 0247 12/23/18 0500  WBC 19.7* 16.6* 16.2* 15.9* 15.6*  NEUTROABS 16.9* 13.8*  --  12.1*  --   HGB 15.9 14.7 14.8 14.6 15.5  HCT 50.3 46.5 45.7 45.0 48.2  MCV 91.1 91.5 90.3 88.9 90.6  PLT 221 182 178 168 212   Basic Metabolic Panel: Recent Labs  Lab 12/19/18 2038 12/20/18 1020 12/21/18 0041 12/22/18 0247 12/23/18 0500  NA 140 141 140 135 139  K 3.6 3.7 3.7 3.4* 3.8  CL 101 102 99 97* 97*  CO2 28 28 28 26 28   GLUCOSE 122* 131* 107* 98 122*  BUN 17 20 16 17  27*   CREATININE 1.42* 1.52* 1.41* 1.51* 1.81*  CALCIUM 8.6* 8.2* 8.0* 8.2* 8.4*   GFR: Estimated Creatinine Clearance: 67.7 mL/min (A) (by C-G formula based on SCr of 1.81 mg/dL (H)). Liver Function Tests: Recent Labs  Lab 12/19/18 2038 12/21/18 0041  AST 21 18  ALT 13 12  ALKPHOS 45 38  BILITOT 0.9 0.8  PROT 7.7 6.6  ALBUMIN 3.2* 2.7*   Recent Labs  Lab 12/19/18 2038  LIPASE 27   No results for input(s): AMMONIA in the last 168 hours. Coagulation Profile: No results for input(s): INR, PROTIME in the last 168 hours. Cardiac Enzymes: No results for input(s): CKTOTAL, CKMB, CKMBINDEX, TROPONINI in the last 168 hours. BNP (last 3 results) No results for input(s): PROBNP in the last 8760 hours. HbA1C: No results for input(s): HGBA1C in the last 72 hours. CBG: No results for input(s): GLUCAP in the last 168 hours. Lipid Profile: No results for input(s): CHOL, HDL, LDLCALC, TRIG, CHOLHDL, LDLDIRECT in the last 72 hours. Thyroid Function Tests: No results for input(s): TSH, T4TOTAL, FREET4, T3FREE, THYROIDAB in the last 72 hours. Anemia Panel: No results for input(s): VITAMINB12, FOLATE,  FERRITIN, TIBC, IRON, RETICCTPCT in the last 72 hours.    Radiology Studies: I have reviewed all of the imaging during this hospital visit personally     Scheduled Meds: . amiodarone  200 mg Oral Daily  . digoxin  0.125 mg Oral Daily  . influenza vac split quadrivalent PF  0.5 mL Intramuscular Tomorrow-1000   Continuous Infusions: . sodium chloride Stopped (12/20/18 1319)  . heparin Stopped (12/23/18 0709)  . piperacillin-tazobactam (ZOSYN)  IV 3.375 g (12/23/18 0521)     LOS: 3 days        Mauricio Annett Gula, MD

## 2018-12-23 NOTE — Progress Notes (Signed)
Day of Surgery    CC: Abdominal cramping and no BM x1 week  Subjective: Roberto [name] is back after his sigmoidoscopy.  His abdomen still distended but Roberto [name] seems fairly comfortable.  Roberto [name] was told it was probably an infection and would get better with antibiotics.  Roberto [name] was placed back on a clear liquid diet.  Objective: Vital signs in last 24 hours: Temp:  [97.8 F (36.6 C)-99.1 F (37.3 C)] 97.8 F (36.6 C) (12/10 1038) Pulse Rate:  [65-84] 65 (12/10 1110) Resp:  [14-31] 25 (12/10 1110) BP: (93-124)/(51-79) 124/70 (12/10 1110) SpO2:  [91 %-93 %] 93 % (12/10 1110) Weight:  [151 kg] 151 kg (12/10 0840) Last BM Date: 12/14/18 1045 p.o. 167 IV 850 urine Stool x1 Vomited x1 at least Afebrile vital signs are stable Creatinine up to 1.81 glucose 122 WBC 15.6 H/H 15.5/48.2 Platelets 212,000 - Flexible sigmoidoscopy results 12/23/2018 Dr. Rush Landmark -hemorrhoids found on digital rectal exam. - Stool in the entire examined colon. Lavaged with fair though inadequate preparation overall for colon polyp detection. - One 8 mm polyp at the recto-sigmoid colon. Not removed due to the poor overall preparation noted in the colon. - Diverticulosis in the recto-sigmoid colon and in the sigmoid colon. - Congested mucosa from 29 to 34 cm proximal to the anus. Biopsied to rule out dysplasia, though does not have typical appearance. Purulent discharge was seen in association with a presumed diverticular opening, suspicious of active diverticulitis. I suspect this is the etiology of the patient's issues currently. - Dilated in the descending colon just proximal to the congested mucosa. Suction via endoscope to decompress as much as possible with removal of at least 1.2 L of stool. - Non-bleeding non-thrombosed internal hemorrhoids   Intake/Output from previous day: 12/09 0701 - 12/10 0700 In: 1212.6 [P.O.:1045; IV Piggyback:167.6] Out: 850 [Urine:850] Intake/Output this shift: Total I/O In: 400  [I.V.:400] Out: -   General appearance: alert, cooperative and no distress GI: Abdomen is distended but not really tender.  Positive bowel sounds.  Roberto [name] reports passing flatus  Lab Results:  Recent Labs    12/22/18 0247 12/23/18 0500  WBC 15.9* 15.6*  HGB 14.6 15.5  HCT 45.0 48.2  PLT 168 212    BMET Recent Labs    12/22/18 0247 12/23/18 0500  NA 135 139  K 3.4* 3.8  CL 97* 97*  CO2 26 28  GLUCOSE 98 122*  BUN 17 27*  CREATININE 1.51* 1.81*  CALCIUM 8.2* 8.4*   PT/INR No results for input(s): LABPROT, INR in the last 72 hours.  Recent Labs  Lab 12/19/18 2038 12/21/18 0041  AST 21 18  ALT 13 12  ALKPHOS 45 38  BILITOT 0.9 0.8  PROT 7.7 6.6  ALBUMIN 3.2* 2.7*     Lipase     Component Value Date/Time   LIPASE 27 12/19/2018 2038     Medications: . amiodarone  200 mg Oral Daily  . digoxin  0.125 mg Oral Daily  . influenza vac split quadrivalent PF  0.5 mL Intramuscular Tomorrow-1000   . sodium chloride Stopped (12/20/18 1319)  . heparin Stopped (12/23/18 0709)  . piperacillin-tazobactam (ZOSYN)  IV 3.375 g (12/23/18 0521)   Assessment/Plan CHF EF 25-30% Atrial fibrillation Hx DVT/PE LV mural thrombus Gross anticoagulant Morbid obesity BMI 50.63 CKD; creatinine: 1.42>> 1.81(12/10) Dehydration    Acute abdominal pain/constipation Diverticulitis versus stricture versus neoplasm  -Flex sig 12/23/2018 Dr. Rush Landmark - results noted above  - GI recommends liquids/abx  FEN: NPO ID:  Zosyn 12/6 >> day 5 DVT: Apixaban last dose 8:51 AM 12/22/2018  - converted to heparin; on hold for Flex sig Follow-up: TBD  Plan: Patient has completed his flexible sigmoidoscopy with results above.  Currently GI recommends liquids and ongoing antibiotics with the suspicion that this is a diverticulitis that would improve with antibiotics.  Biopsy pathology is pending.  We will continue to follow with you.      LOS: 3 days    Roberto [name] 12/23/2018 Please  see Amion

## 2018-12-23 NOTE — Anesthesia Procedure Notes (Signed)
Procedure Name: MAC Date/Time: 12/23/2018 9:17 AM Performed by: Jenne Campus, CRNA Pre-anesthesia Checklist: Patient identified, Emergency Drugs available, Suction available, Patient being monitored and Timeout performed Oxygen Delivery Method: Simple face mask

## 2018-12-24 DIAGNOSIS — K56609 Unspecified intestinal obstruction, unspecified as to partial versus complete obstruction: Secondary | ICD-10-CM

## 2018-12-24 LAB — CBC
HCT: 43.3 % (ref 39.0–52.0)
Hemoglobin: 13.7 g/dL (ref 13.0–17.0)
MCH: 28.9 pg (ref 26.0–34.0)
MCHC: 31.6 g/dL (ref 30.0–36.0)
MCV: 91.4 fL (ref 80.0–100.0)
Platelets: 192 10*3/uL (ref 150–400)
RBC: 4.74 MIL/uL (ref 4.22–5.81)
RDW: 13.2 % (ref 11.5–15.5)
WBC: 13.1 10*3/uL — ABNORMAL HIGH (ref 4.0–10.5)
nRBC: 0 % (ref 0.0–0.2)

## 2018-12-24 LAB — BASIC METABOLIC PANEL
Anion gap: 11 (ref 5–15)
BUN: 21 mg/dL — ABNORMAL HIGH (ref 6–20)
CO2: 30 mmol/L (ref 22–32)
Calcium: 8.3 mg/dL — ABNORMAL LOW (ref 8.9–10.3)
Chloride: 97 mmol/L — ABNORMAL LOW (ref 98–111)
Creatinine, Ser: 1.44 mg/dL — ABNORMAL HIGH (ref 0.61–1.24)
GFR calc Af Amer: >60 mL/min (ref >60)
GFR calc non Af Amer: 55 mL/min — ABNORMAL LOW (ref >60)
Glucose, Bld: 116 mg/dL — ABNORMAL HIGH (ref 70–99)
Potassium: 3.2 mmol/L — ABNORMAL LOW (ref 3.5–5.1)
Sodium: 138 mmol/L (ref 135–145)

## 2018-12-24 LAB — APTT
aPTT: 56 seconds — ABNORMAL HIGH (ref 24–36)
aPTT: 81 seconds — ABNORMAL HIGH (ref 24–36)

## 2018-12-24 LAB — HEPARIN LEVEL (UNFRACTIONATED): Heparin Unfractionated: 1.58 IU/mL — ABNORMAL HIGH (ref 0.30–0.70)

## 2018-12-24 LAB — SURGICAL PATHOLOGY

## 2018-12-24 MED ORDER — POLYETHYLENE GLYCOL 3350 17 G PO PACK
17.0000 g | PACK | Freq: Every day | ORAL | Status: DC
  Administered 2018-12-24 – 2018-12-25 (×2): 17 g via ORAL
  Filled 2018-12-24 (×2): qty 1

## 2018-12-24 MED ORDER — POTASSIUM CHLORIDE CRYS ER 20 MEQ PO TBCR
40.0000 meq | EXTENDED_RELEASE_TABLET | Freq: Once | ORAL | Status: AC
  Administered 2018-12-24: 40 meq via ORAL
  Filled 2018-12-24: qty 2

## 2018-12-24 MED ORDER — BISACODYL 10 MG RE SUPP
10.0000 mg | Freq: Once | RECTAL | Status: AC
  Administered 2018-12-24: 10 mg via RECTAL
  Filled 2018-12-24: qty 1

## 2018-12-24 NOTE — Progress Notes (Signed)
Progress Note  Patient Name: Roberto [name] Date of Encounter: 12/24/2018  Primary Cardiologist: Arvilla Meres, MD   Subjective   Pt denies any chest discomfort, shortness of breath, orthopnea. He feels like he is at his baseline, does not feel extra volume on board. He has trace pretibial edema, R>L, he says is related to his knee surgeries with the right one being more recent.   Inpatient Medications    Scheduled Meds: . amiodarone  200 mg Oral Daily  . digoxin  0.125 mg Oral Daily  . influenza vac split quadrivalent PF  0.5 mL Intramuscular Tomorrow-1000   Continuous Infusions: . sodium chloride Stopped (12/20/18 1319)  . sodium chloride 50 mL/hr at 12/24/18 0300  . heparin 2,200 Units/hr (12/24/18 0956)  . piperacillin-tazobactam (ZOSYN)  IV 3.375 g (12/24/18 0630)   PRN Meds: sodium chloride, ondansetron **OR** ondansetron (ZOFRAN) IV, oxyCODONE-acetaminophen, sodium chloride flush   Vital Signs    Vitals:   12/23/18 1540 12/23/18 2110 12/24/18 0500 12/24/18 0631  BP: (!) 115/53 116/71  116/80  Pulse: 77 74  67  Resp: 18 17  17   Temp: 98.2 F (36.8 C) 98.8 F (37.1 C)  98 F (36.7 C)  TempSrc: Oral Oral  Oral  SpO2: 95% 94%  96%  Weight:   (!) 151 kg   Height:        Intake/Output Summary (Last 24 hours) at 12/24/2018 1027 Last data filed at 12/24/2018 0631 Gross per 24 hour  Intake 2410.21 ml  Output 970 ml  Net 1440.21 ml   Last 3 Weights 12/24/2018 12/23/2018 12/19/2018  Weight (lbs) 332 lb 14.3 oz 333 lb 333 lb  Weight (kg) 151 kg 151.048 kg 151.048 kg      Telemetry    Non tele  ECG    No new tracings for review.   Physical Exam   GEN: No acute distress.   Neck: No JVD Cardiac: RRR, no murmurs, rubs, or gallops. Occ irregular beat Respiratory: Clear to auscultation bilaterally. GI: Soft, non-distended  MS: Trace pretibial edema R>L; No deformity. Neuro:  Nonfocal  Psych: Normal affect   Labs    High Sensitivity Troponin:  No  results for input(s): TROPONINIHS in the last 720 hours.    Chemistry Recent Labs  Lab 12/19/18 2038 12/21/18 0041 12/22/18 0247 12/23/18 0500 12/24/18 0537  NA 140 140 135 139 138  K 3.6 3.7 3.4* 3.8 3.2*  CL 101 99 97* 97* 97*  CO2 28 28 26 28 30   GLUCOSE 122* 107* 98 122* 116*  BUN 17 16 17  27* 21*  CREATININE 1.42* 1.41* 1.51* 1.81* 1.44*  CALCIUM 8.6* 8.0* 8.2* 8.4* 8.3*  PROT 7.7 6.6  --   --   --   ALBUMIN 3.2* 2.7*  --   --   --   AST 21 18  --   --   --   ALT 13 12  --   --   --   ALKPHOS 45 38  --   --   --   BILITOT 0.9 0.8  --   --   --   GFRNONAA 56* 56* 52* 42* 55*  GFRAA >60 >60 >60 48* >60  ANIONGAP 11 13 12 14 11      Hematology Recent Labs  Lab 12/22/18 0247 12/23/18 0500 12/24/18 0537  WBC 15.9* 15.6* 13.1*  RBC 5.06 5.32 4.74  HGB 14.6 15.5 13.7  HCT 45.0 48.2 43.3  MCV 88.9 90.6 91.4  MCH  28.9 29.1 28.9  MCHC 32.4 32.2 31.6  RDW 13.4 13.3 13.2  PLT 168 212 192    BNPNo results for input(s): BNP, PROBNP in the last 168 hours.   DDimer No results for input(s): DDIMER in the last 168 hours.   Radiology    DG Abd 2 Views  Result Date: 12/22/2018 CLINICAL DATA:  Abdominal pain, constipation EXAM: ABDOMEN - 2 VIEW COMPARISON:  CT abdomen pelvis 12/19/2018 FINDINGS: There is worsening gaseous distension throughout the proximal colon and now involving the small bowel, markedly increased from comparison CT 12/19/2018. No convincing evidence of free intraperitoneal air. Streaky atelectatic changes are present in the lung base. Tip of a defibrillator wire projects towards the cardiac apex. IMPRESSION: Worsening gaseous distension of the proximal colon and now involving much of the small bowel. Findings are worrisome for progressive bowel obstruction possibly at the level of the possible stricture/mass seen on comparison CT. No free air. Electronically Signed   By: Lovena Le M.D.   On: 12/22/2018 19:14    Cardiac Studies   Echocardiogram  07/20/2018 IMPRESSIONS   1. The left ventricle has severely reduced systolic function, with an ejection fraction of 25-30%. The cavity size was moderately dilated. Left ventricular diastolic Doppler parameters are consistent with pseudonormalization. Left ventricular diffuse  hypokinesis.  2. The right ventricle has normal systolic function. The cavity was normal. There is no increase in right ventricular wall thickness.  3. Left atrial size was mildly dilated.  4. No evidence of mitral valve stenosis. Trivial mitral regurgitation.  5. The aortic valve is tricuspid. No stenosis of the aortic valve.  6. The aortic root is normal in size and structure.  7. The inferior vena cava was dilated in size with <50% respiratory variability. No complete TR doppler jet so unable to estimate PA systolic pressure.  Patient Profile     53 y.o. male with a hx of chronic diastolic HF, h/o of Lv thrombus, nonischemic cardiomyopathy (EF 25-30% 2013, EF 2020 25-30%), ICD placed 08/2018, PSVT (elected for medical therapy over RFA), DVT/ PE, Paroxysmal afib on amiodarone, Aflutter ablation 11/2016, lupus, and morbid obesity initially seen for preop evaluation in setting of abdominal pain/acute diverticulitis. Followed for hx of CHF.   Assessment & Plan    Abdominal pain/acute diverticulits -Flex sig on 12/10. GI recommends liquid diet and antibiotics.  -Eliquis on hold, on IV heparin. Switch back to Eliquis when OK with Surgery/GI.   Chronic systolic heart failure -Followed as outpatient in Advanced Heart Failure clinic by Dr. Mahalia Longest. At home on Entresto, spironolactone and Torsemide, all on hold while here. Continues on digoxin. -Pt receiving IV fluids for AKI. SCR was up to 1.81, now improved to 1.44.  -Watch fluid status closely, especially with administration of IV fluids.  Resume diuretics when felt to be stable. Currently looks euvolemic.   Paroxysmal atrial fibrillation -Has hx of Aflutter ablation  11/2016. Now maintained on amiodarone. On Eliquis for stroke risk reduction at home.  -Pt not on telemetry. Heart sounds with occ skipped beat, sounds like PVCs. K+ 3.2 today. Will order KDur 40 mEq.      For questions or updates, please contact [place] Please consult [email or web address] for contact info under        Signed, Daune Perch, NP  12/24/2018, 10:27 AM

## 2018-12-24 NOTE — Progress Notes (Signed)
ANTICOAGULATION CONSULT NOTE - Follow Up Consult  Pharmacy Consult for Heparin Indication: h/o DVT  Allergies  Allergen Reactions  . Shrimp [Shellfish Allergy] Hives and Itching    Patient Measurements: Height: 5\' 8"  (172.7 cm) Weight: (!) 332 lb 14.3 oz (151 kg) IBW/kg (Calculated) : 68.4 Heparin Dosing Weight: 105.2 kg  Vital Signs: Temp: 97.9 F (36.6 C) (12/11 2035) Temp Source: Oral (12/11 2035) BP: 102/67 (12/11 2035) Pulse Rate: 64 (12/11 2035)  Labs: Recent Labs    12/22/18 0247 12/22/18 0247 12/23/18 0500 12/23/18 2220 12/24/18 0537 12/24/18 0652 12/24/18 2008  HGB 14.6  --  15.5  --  13.7  --   --   HCT 45.0  --  48.2  --  43.3  --   --   PLT 168  --  212  --  192  --   --   APTT  --    < > 40* 38*  --  56* 81*  HEPARINUNFRC  --   --  >2.20* 1.78* 1.58*  --   --   CREATININE 1.51*  --  1.81*  --  1.44*  --   --    < > = values in this interval not displayed.    Estimated Creatinine Clearance: 85.1 mL/min (A) (by C-G formula based on SCr of 1.44 mg/dL (H)).   Assessment:  Anticoag: Apixaban for DVT history, dose appropriate, apixaban held, starting heparin. APTT 81 now in goal range.  Goal of Therapy:  aPTT 66-102 seconds Monitor platelets by anticoagulation protocol: Yes   Plan:  Heparin at 2200 units / hr Daily aPTT, HL, and CBC   Roberto [name], PharmD, BCPS Clinical Staff Pharmacist [place], [place] 12/24/2018,9:08 PM

## 2018-12-24 NOTE — Progress Notes (Signed)
[facility]/Trauma Progress Note  1 Day Post-Op   Assessment/Plan CHF EF 25-30% Atrial fibrillation Hx DVT/PE LV mural thrombus Gross anticoagulant Morbid obesityBMI 50.63 CKD Dehydration  Acute diverticulitis, complicated with intraluminal stenosis - Flex sig 12/10 Dr. Meridee Score - congested mucosa, positive purulent discharge, luminal narrowing - GI recommends liquids/abx - we will continue to follow. Hopefully pt will open up with conservative management and we can avoid emergent surgery this admission  FEN: CLD ID: Zosyn 12/6>>  DVT: Apixaban last dose 12/9, heparin Follow-up: TBD  Plan: GI recommends liquids and ongoing antibiotics with the suspicion that this is a diverticulitis that would improve with antibiotics.  Biopsy pathology is pending.  We will continue to follow with you.    LOS: 4 days    Subjective: CC: abdominal bloating  No abdominal pain, nausea or vomiting. Endorses bloating. No flatus or BM overnight. Discussed conservative management and surgical intervention if medical mngt fails. He expressed understanding.   Objective: Vital signs in last 24 hours: Temp:  [97.8 F (36.6 C)-98.8 F (37.1 C)] 98 F (36.7 C) (12/11 0631) Pulse Rate:  [65-77] 67 (12/11 0631) Resp:  [14-25] 17 (12/11 0631) BP: (113-124)/(53-80) 116/80 (12/11 0631) SpO2:  [92 %-96 %] 96 % (12/11 0631) Weight:  [151 kg] 151 kg (12/11 0500) Last BM Date: 12/22/18  Intake/Output from previous day: 12/10 0701 - 12/11 0700 In: 2710.2 [P.O.:1080; I.V.:1343; IV Piggyback:287.2] Out: 970 [Urine:970] Intake/Output this shift: No intake/output data recorded.  PE:  Gen:  Alert, NAD, pleasant, cooperative Pulm:  Rate and effort normal Abd: Soft, obese, hypoactive BS, RLQ TTP with guarding. No peritonitis.  Skin: warm and dry   Anti-infectives: Anti-infectives (From admission, onward)   Start     Dose/Rate Route Frequency Ordered Stop   12/20/18 2200   piperacillin-tazobactam (ZOSYN) IVPB 3.375 g     3.375 g 12.5 mL/hr over 240 Minutes Intravenous Every 8 hours 12/20/18 1834     12/20/18 0645  piperacillin-tazobactam (ZOSYN) IVPB 3.375 g  Status:  Discontinued     3.375 g 100 mL/hr over 30 Minutes Intravenous Every 6 hours 12/20/18 0630 12/20/18 1833   12/19/18 2315  piperacillin-tazobactam (ZOSYN) IVPB 3.375 g     3.375 g 100 mL/hr over 30 Minutes Intravenous  Once 12/19/18 2302 12/20/18 0003      Lab Results:  Recent Labs    12/23/18 0500 12/24/18 0537  WBC 15.6* 13.1*  HGB 15.5 13.7  HCT 48.2 43.3  PLT 212 192   BMET Recent Labs    12/23/18 0500 12/24/18 0537  NA 139 138  K 3.8 3.2*  CL 97* 97*  CO2 28 30  GLUCOSE 122* 116*  BUN 27* 21*  CREATININE 1.81* 1.44*  CALCIUM 8.4* 8.3*   PT/INR No results for input(s): LABPROT, INR in the last 72 hours. CMP     Component Value Date/Time   NA 138 12/24/2018 0537   NA 140 08/26/2018 1355   K 3.2 (L) 12/24/2018 0537   CL 97 (L) 12/24/2018 0537   CO2 30 12/24/2018 0537   GLUCOSE 116 (H) 12/24/2018 0537   BUN 21 (H) 12/24/2018 0537   BUN 14 08/26/2018 1355   CREATININE 1.44 (H) 12/24/2018 0537   CREATININE 1.20 10/29/2016 1236   CALCIUM 8.3 (L) 12/24/2018 0537   PROT 6.6 12/21/2018 0041   PROT 7.4 12/03/2018 1459   ALBUMIN 2.7 (L) 12/21/2018 0041   ALBUMIN 3.6 (L) 12/03/2018 1459   AST 18 12/21/2018 0041  ALT 12 12/21/2018 0041   ALKPHOS 38 12/21/2018 0041   BILITOT 0.8 12/21/2018 0041   BILITOT 0.3 12/03/2018 1459   GFRNONAA 55 (L) 12/24/2018 0537   GFRAA >60 12/24/2018 0537   Lipase     Component Value Date/Time   LIPASE 27 12/19/2018 2038    Studies/Results: DG Abd 2 Views  Result Date: 12/22/2018 CLINICAL DATA:  Abdominal pain, constipation EXAM: ABDOMEN - 2 VIEW COMPARISON:  CT abdomen pelvis 12/19/2018 FINDINGS: There is worsening gaseous distension throughout the proximal colon and now involving the small bowel, markedly increased from  comparison CT 12/19/2018. No convincing evidence of free intraperitoneal air. Streaky atelectatic changes are present in the lung base. Tip of a defibrillator wire projects towards the cardiac apex. IMPRESSION: Worsening gaseous distension of the proximal colon and now involving much of the small bowel. Findings are worrisome for progressive bowel obstruction possibly at the level of the possible stricture/mass seen on comparison CT. No free air. Electronically Signed   By: Lovena Le M.D.   On: 12/22/2018 19:14     Kalman Drape, [facility] Please see amion for pager for the following: Cristine Polio, & Friday 7:00am - 4:30pm Thursdays 7:00am -11:30am

## 2018-12-24 NOTE — Progress Notes (Signed)
ANTICOAGULATION CONSULT NOTE - Follow Up Consult  Pharmacy Consult for heparin Indication: h/o PE/DVT  Labs: Recent Labs    12/22/18 0247 12/23/18 0500 12/23/18 2220 12/24/18 0537 12/24/18 0652  HGB 14.6 15.5  --  13.7  --   HCT 45.0 48.2  --  43.3  --   PLT 168 212  --  192  --   APTT  --  40* 38*  --  56*  HEPARINUNFRC  --  >2.20* 1.78* 1.58*  --   CREATININE 1.51* 1.81*  --  1.44*  --     Assessment: 53yo male continues on heparin while Xarelto on hold PTT 56 this AM CBC stable  Goal of Therapy:  aPTT 66-102 seconds   Plan:  Increase heparin to 2200 units / hr 8 hour PTT  Thank you Anette Guarneri, PharmD   12/24/2018,9:00 AM

## 2018-12-24 NOTE — Progress Notes (Signed)
PROGRESS NOTE    Roberto [name]  RSW:[id number] DOB: 02-16-1965 DOA: 12/19/2018 PCP: [name], MD    Brief Narrative:  53 year old male who presented with right lower quadrant abdominal pain.  He does have significant past medical history for chronic diastolic heart failure, ischemic cardiomyopathy ejection fraction 25 to 30%, deep vein thrombosis, pulmonary embolism, paroxysmal atrial fibrillation, lupus anticoagulant and morbid obesity. His pain has been constant, 7 out of 10 in intensity.  Failed outpatient therapy with symptomatic control. CT of the abdomen and pelvis with dilated fluid-filled colon from cecal tip to distal descending colon, abrupt transition in caliber extent defined, possible diverticulitis rule out stricture or mass.   Patient was admitted to the hospital with a working diagnosis of diverticulitis.  Patient with persistent symptoms of obstruction, underwent flex sigmoidoscopy finding congested mucosa with purulent discharge, with bowel lumen narrowing. GI recommend continue treatment for diverticulitis.    Assessment & Plan:   Principal Problem:   Acute diverticulitis Active Problems:   Obesity, morbid (HCC)   Nonischemic cardiomyopathy (HCC)   Paroxysmal SVT (supraventricular tachycardia) (HCC)   Chronic systolic heart failure (HCC)   Chronic pain of right knee   History of DVT (deep vein thrombosis)   PAF (paroxysmal atrial fibrillation) (HCC)   Pre-operative cardiovascular examination    1. Acute diverticulitis, complicated with intraluminal stenosis. -S/P  Flex sigmoidoscopy with congested mucosa 29 to 34 cm proximal from the anus, positive purulent discharge, luminal narrowing. Patient sp decompression.  - Wbc trending down today at 13.  - Continue with Zosyn.  -Dr. Meridee Score, will plan continue medical therapy for now, but if no improvement, will need surgical intervention.    2. Non ischemic cardiomyopathy with EF 25 to 30% sp AICD  08/20.  -Continue clinically euvolemic, heart failure management with digoxin and  amiodarone.  -Will NSL fluids. Resume diuretic tomorrow if renal function stable.  -appreciate cardiology assistance.  3. AKI with hypokalemia.  -Worsening renal function with serum cr at 1,8 . -NSL fluids.  -Holding diuretics.  -Cr has decreased to 1.4  -replete k orally.   4. Lupus anticoagulant with DVT, PE and LV mural thrombus.  Continue heparin bridge while patient having GI procedures, may need surgical intervention if no response to antibiotic therapy.   5. Morbid obesity. His calculated BMI is 50    DVT prophylaxis: Heparin Gtt  Code Status:  full Family Communication: no family at the bedside  Disposition Plan/ discharge barriers:  Pending clinical improvement.    Body mass index is 50.62 kg/m. Malnutrition Type:    Consultants:   GI   Surgery   Procedures:   Felx sigmoidoscopy on 12/10  Antimicrobials:   Zosyn IV     Subjective: He has not had BM in a week. Report right lower quadrant pain.  Denies dyspnea  Objective: Vitals:   12/23/18 2110 12/24/18 0500 12/24/18 0631 12/24/18 1452  BP: 116/71  116/80 117/85  Pulse: 74  67 74  Resp: 17  17 17   Temp: 98.8 F (37.1 C)  98 F (36.7 C) 98.2 F (36.8 C)  TempSrc: Oral  Oral Oral  SpO2: 94%  96% 96%  Weight:  (!) 151 kg    Height:        Intake/Output Summary (Last 24 hours) at 12/24/2018 1603 Last data filed at 12/24/2018 1000 Gross per 24 hour  Intake 1653.07 ml  Output 1470 ml  Net 183.07 ml   Filed Weights   12/19/18 1957 12/23/18  0840 12/24/18 0500  Weight: (!) 151 kg (!) 151 kg (!) 151 kg    Examination:   General: NAD Neurology:Alert, non focal.  Cardiovascular: S 1, S 2 RRR. Pulmonary: CTA Gastrointestinal. BS present, distended, no guarding Skin. No rashes      Data Reviewed: I have personally reviewed following labs and imaging studies  CBC: Recent Labs  Lab 12/19/18 2038  12/20/18 1020 12/21/18 0041 12/22/18 0247 12/23/18 0500 12/24/18 0537  WBC 19.7* 16.6* 16.2* 15.9* 15.6* 13.1*  NEUTROABS 16.9* 13.8*  --  12.1*  --   --   HGB 15.9 14.7 14.8 14.6 15.5 13.7  HCT 50.3 46.5 45.7 45.0 48.2 43.3  MCV 91.1 91.5 90.3 88.9 90.6 91.4  PLT 221 182 178 168 212 867   Basic Metabolic Panel: Recent Labs  Lab 12/20/18 1020 12/21/18 0041 12/22/18 0247 12/23/18 0500 12/24/18 0537  NA 141 140 135 139 138  K 3.7 3.7 3.4* 3.8 3.2*  CL 102 99 97* 97* 97*  CO2 28 28 26 28 30   GLUCOSE 131* 107* 98 122* 116*  BUN 20 16 17  27* 21*  CREATININE 1.52* 1.41* 1.51* 1.81* 1.44*  CALCIUM 8.2* 8.0* 8.2* 8.4* 8.3*   GFR: Estimated Creatinine Clearance: 85.1 mL/min (A) (by C-G formula based on SCr of 1.44 mg/dL (H)). Liver Function Tests: Recent Labs  Lab 12/19/18 2038 12/21/18 0041  AST 21 18  ALT 13 12  ALKPHOS 45 38  BILITOT 0.9 0.8  PROT 7.7 6.6  ALBUMIN 3.2* 2.7*   Recent Labs  Lab 12/19/18 2038  LIPASE 27   No results for input(s): AMMONIA in the last 168 hours. Coagulation Profile: No results for input(s): INR, PROTIME in the last 168 hours. Cardiac Enzymes: No results for input(s): CKTOTAL, CKMB, CKMBINDEX, TROPONINI in the last 168 hours. BNP (last 3 results) No results for input(s): PROBNP in the last 8760 hours. HbA1C: No results for input(s): HGBA1C in the last 72 hours. CBG: No results for input(s): GLUCAP in the last 168 hours. Lipid Profile: No results for input(s): CHOL, HDL, LDLCALC, TRIG, CHOLHDL, LDLDIRECT in the last 72 hours. Thyroid Function Tests: No results for input(s): TSH, T4TOTAL, FREET4, T3FREE, THYROIDAB in the last 72 hours. Anemia Panel: No results for input(s): VITAMINB12, FOLATE, FERRITIN, TIBC, IRON, RETICCTPCT in the last 72 hours.    Radiology Studies: I have reviewed all of the imaging during this hospital visit personally     Scheduled Meds: . amiodarone  200 mg Oral Daily  . digoxin  0.125 mg Oral  Daily  . influenza vac split quadrivalent PF  0.5 mL Intramuscular Tomorrow-1000  . polyethylene glycol  17 g Oral Daily   Continuous Infusions: . sodium chloride Stopped (12/20/18 1319)  . sodium chloride 50 mL/hr at 12/24/18 0300  . heparin 2,200 Units/hr (12/24/18 0956)  . piperacillin-tazobactam (ZOSYN)  IV 3.375 g (12/24/18 1415)     LOS: 4 days        Elmarie Shiley, MD

## 2018-12-24 NOTE — Progress Notes (Addendum)
[facility] Progress Note  CC:  Diverticulitis, bowel obstruction  Subjective:  Says not passing anything from bottom, no flatus or stool.  Abdomen still very distended, but no pain.  Tolerating clears without issues.  Flex sig on 12/10 with Dr. Rush Landmark showed the following:  - Preparation of the colon was inadequate. - Hemorrhoids found on digital rectal exam. - Stool in the entire examined colon. Lavaged with fair though inadequate preparation overall for colon polyp detection. - One 8 mm polyp at the recto-sigmoid colon. Not removed due to the poor overall preparation noted in the colon. - Diverticulosis in the recto-sigmoid colon and in the sigmoid colon. - Congested mucosa from 29 to 34 cm proximal to the anus. Biopsied to rule out dysplasia, though does not have typical appearance. Purulent discharge was seen in association with a presumed diverticular opening, suspicious of active diverticulitis. I suspect this is the etiology of the patient's issues currently. - Dilated in the descending colon just proximal to the congested mucosa. Suction via endoscope to decompress as much as possible with removal of at least 1.2 L of stool. - Non-bleeding non-thrombosed internal hemorrhoids.  Objective:  Vital signs in last 24 hours: Temp:  [97.8 F (36.6 C)-98.8 F (37.1 C)] 98 F (36.7 C) (12/11 0631) Pulse Rate:  [65-77] 67 (12/11 0631) Resp:  [14-25] 17 (12/11 0631) BP: (113-124)/(53-80) 116/80 (12/11 0631) SpO2:  [92 %-96 %] 96 % (12/11 0631) Weight:  [151 kg] 151 kg (12/11 0500) Last BM Date: 12/22/18 General:   Alert,  Well-developed,    in NAD Heart:  Regular rate and rhythm; no murmurs Pulm; Abdomen:  Soft, nontender and nondistended. Normal bowel sounds, without guarding, and without rebound.   Extremities:  Without edema. Neurologic:  Alert and  oriented x4;  grossly normal neurologically. Psych:  Alert and cooperative. Normal mood and  affect.  Intake/Output from previous day: 12/10 0701 - 12/11 0700 In: 2710.2 [P.O.:1080; I.V.:1343; IV Piggyback:287.2] Out: 970 [Urine:970]  Lab Results: Recent Labs    12/22/18 0247 12/23/18 0500 12/24/18 0537  WBC 15.9* 15.6* 13.1*  HGB 14.6 15.5 13.7  HCT 45.0 48.2 43.3  PLT 168 212 192   BMET Recent Labs    12/22/18 0247 12/23/18 0500 12/24/18 0537  NA 135 139 138  K 3.4* 3.8 3.2*  CL 97* 97* 97*  CO2 26 28 30   GLUCOSE 98 122* 116*  BUN 17 27* 21*  CREATININE 1.51* 1.81* 1.44*  CALCIUM 8.2* 8.4* 8.3*   DG Abd 2 Views  Result Date: 12/22/2018 CLINICAL DATA:  Abdominal pain, constipation EXAM: ABDOMEN - 2 VIEW COMPARISON:  CT abdomen pelvis 12/19/2018 FINDINGS: There is worsening gaseous distension throughout the proximal colon and now involving the small bowel, markedly increased from comparison CT 12/19/2018. No convincing evidence of free intraperitoneal air. Streaky atelectatic changes are present in the lung base. Tip of a defibrillator wire projects towards the cardiac apex. IMPRESSION: Worsening gaseous distension of the proximal colon and now involving much of the small bowel. Findings are worrisome for progressive bowel obstruction possibly at the level of the possible stricture/mass seen on comparison CT. No free air. Electronically Signed   By: Lovena Le M.D.   On: 12/22/2018 19:14   Assessment / Plan: *Colonic/small bowel obstruction, suspected from acute diverticulitis/diverticular disease as seen on flex sig with Dr. Rush Landmark on 12/10.  Day 6 Empiric Zosyn. WBCs trending down to 13K this AM.  Pain well controlled on PO oxycodone.  *  Chronic Eliquis last dose 12/9 AM.  Indications: Remote DVT/PE 2007, cardiac mural thrombus.  Now on IV heparin.  *Hypokalemia, K+ 3.2 this AM.   *CHF.  LVEF 25 to 30% on 07/2018 echo.  Compensated.     *Covid 19 negative  -Patient asking for something to help get his bowels moving.  Will check with Dr. Meridee Score if  ok to give Dulcolax suppository and/or start some once daily Miralax since he is tolerating clears well.    LOS: 4 days   Princella Pellegrini. Bhavesh Vazquez  12/24/2018, 9:14 AM

## 2018-12-24 NOTE — Plan of Care (Signed)

## 2018-12-25 DIAGNOSIS — I471 Supraventricular tachycardia: Secondary | ICD-10-CM

## 2018-12-25 LAB — BASIC METABOLIC PANEL
Anion gap: 12 (ref 5–15)
BUN: 9 mg/dL (ref 6–20)
CO2: 28 mmol/L (ref 22–32)
Calcium: 8.3 mg/dL — ABNORMAL LOW (ref 8.9–10.3)
Chloride: 98 mmol/L (ref 98–111)
Creatinine, Ser: 1.29 mg/dL — ABNORMAL HIGH (ref 0.61–1.24)
GFR calc Af Amer: >60 mL/min (ref >60)
GFR calc non Af Amer: >60 mL/min (ref >60)
Glucose, Bld: 94 mg/dL (ref 70–99)
Potassium: 3.3 mmol/L — ABNORMAL LOW (ref 3.5–5.1)
Sodium: 138 mmol/L (ref 135–145)

## 2018-12-25 LAB — CBC
HCT: 42.3 % (ref 39.0–52.0)
Hemoglobin: 13.8 g/dL (ref 13.0–17.0)
MCH: 29.6 pg (ref 26.0–34.0)
MCHC: 32.6 g/dL (ref 30.0–36.0)
MCV: 90.6 fL (ref 80.0–100.0)
Platelets: 193 10*3/uL (ref 150–400)
RBC: 4.67 MIL/uL (ref 4.22–5.81)
RDW: 13.2 % (ref 11.5–15.5)
WBC: 11.6 10*3/uL — ABNORMAL HIGH (ref 4.0–10.5)
nRBC: 0 % (ref 0.0–0.2)

## 2018-12-25 LAB — APTT: aPTT: 77 seconds — ABNORMAL HIGH (ref 24–36)

## 2018-12-25 LAB — HEPARIN LEVEL (UNFRACTIONATED): Heparin Unfractionated: 0.98 IU/mL — ABNORMAL HIGH (ref 0.30–0.70)

## 2018-12-25 MED ORDER — POLYETHYLENE GLYCOL 3350 17 G PO PACK
17.0000 g | PACK | Freq: Two times a day (BID) | ORAL | Status: DC
  Administered 2018-12-25 – 2018-12-26 (×3): 17 g via ORAL
  Filled 2018-12-25 (×3): qty 1

## 2018-12-25 MED ORDER — SPIRONOLACTONE 25 MG PO TABS
25.0000 mg | ORAL_TABLET | Freq: Every day | ORAL | Status: DC
  Administered 2018-12-25 – 2018-12-27 (×3): 25 mg via ORAL
  Filled 2018-12-25 (×3): qty 1

## 2018-12-25 MED ORDER — TORSEMIDE 20 MG PO TABS
40.0000 mg | ORAL_TABLET | Freq: Two times a day (BID) | ORAL | Status: DC
  Administered 2018-12-25 – 2018-12-27 (×4): 40 mg via ORAL
  Filled 2018-12-25 (×7): qty 2

## 2018-12-25 MED ORDER — SENNOSIDES-DOCUSATE SODIUM 8.6-50 MG PO TABS
1.0000 | ORAL_TABLET | Freq: Every day | ORAL | Status: DC
  Administered 2018-12-25 – 2018-12-26 (×2): 1 via ORAL
  Filled 2018-12-25 (×2): qty 1

## 2018-12-25 NOTE — Progress Notes (Signed)
Patient ID: Roberto [name], male   DOB: [birth year], 53 y.o.   MRN: [id number] [facility] Progress Note:   2 Days Post-Op  Subjective: Mental status is clear;  Sitting on bedside complaining of bloating Objective: Vital signs in last 24 hours: Temp:  [97.9 F (36.6 C)-98.2 F (36.8 C)] 98.1 F (36.7 C) (12/12 0537) Pulse Rate:  [64-74] 64 (12/12 0537) Resp:  [16-17] 16 (12/12 0537) BP: (102-117)/(67-85) 114/71 (12/12 0537) SpO2:  [89 %-96 %] 89 % (12/12 0537) Weight:  [150.5 kg] 150.5 kg (12/12 0500)  Intake/Output from previous day: 12/11 0701 - 12/12 0700 In: 1590.5 [P.O.:1320; I.V.:220.5; IV Piggyback:50] Out: 800 [Urine:800] Intake/Output this shift: No intake/output data recorded.  Physical Exam: Work of breathing is not labored;  Just had small liquid BM but not much flatus.    Lab Results:  Results for orders placed or performed during the hospital encounter of 12/19/18 (from the past 48 hour(s))  Heparin level (unfractionated)     Status: Abnormal   Collection Time: 12/23/18 10:20 PM  Result Value Ref Range   Heparin Unfractionated 1.78 (H) 0.30 - 0.70 IU/mL    Comment: RESULTS CONFIRMED BY MANUAL DILUTION Performed at [facility], [address], [place], [place] [address]   APTT     Status: Abnormal   Collection Time: 12/23/18 10:20 PM  Result Value Ref Range   aPTT 38 (H) 24 - 36 seconds    Comment:        IF BASELINE aPTT IS ELEVATED, SUGGEST PATIENT RISK ASSESSMENT BE USED TO DETERMINE APPROPRIATE ANTICOAGULANT THERAPY. Performed at [facility], [address] [place], [place] [address]   CBC     Status: Abnormal   Collection Time: 12/24/18  5:37 AM  Result Value Ref Range   WBC 13.1 (H) 4.0 - 10.5 K/uL   RBC 4.74 4.22 - 5.81 MIL/uL   Hemoglobin 13.7 13.0 - 17.0 g/dL   HCT 29.7 98.9 - 21.1 %   MCV 91.4 80.0 - 100.0 fL   MCH 28.9 26.0 - 34.0 pg   MCHC 31.6 30.0 - 36.0 g/dL   RDW 94.1 74.0 - 81.4 %   Platelets 192 150 - 400  K/uL   nRBC 0.0 0.0 - 0.2 %    Comment: Performed at [facility], [address] [place], [place] [address]  Basic metabolic panel     Status: Abnormal   Collection Time: 12/24/18  5:37 AM  Result Value Ref Range   Sodium 138 135 - 145 mmol/L   Potassium 3.2 (L) 3.5 - 5.1 mmol/L   Chloride 97 (L) 98 - 111 mmol/L   CO2 30 22 - 32 mmol/L   Glucose, Bld 116 (H) 70 - 99 mg/dL   BUN 21 (H) 6 - 20 mg/dL   Creatinine, Ser 6.31 (H) 0.61 - 1.24 mg/dL   Calcium 8.3 (L) 8.9 - 10.3 mg/dL   GFR calc non Af Amer 55 (L) >60 mL/min   GFR calc Af Amer >60 >60 mL/min   Anion gap 11 5 - 15    Comment: Performed at [facility], [address] [place], [place] [address]  Heparin level (unfractionated)     Status: Abnormal   Collection Time: 12/24/18  5:37 AM  Result Value Ref Range   Heparin Unfractionated 1.58 (H) 0.30 - 0.70 IU/mL    Comment: RESULTS CONFIRMED BY MANUAL DILUTION (NOTE) If heparin results are below expected values, and  patient dosage has  been confirmed, suggest follow up testing of antithrombin III levels. Performed at [facility], [place] [address] [place], [place] [address]   APTT     Status: Abnormal   Collection Time: 12/24/18  6:52 AM  Result Value Ref Range   aPTT 56 (H) 24 - 36 seconds    Comment:        IF BASELINE aPTT IS ELEVATED, SUGGEST PATIENT RISK ASSESSMENT BE USED TO DETERMINE APPROPRIATE ANTICOAGULANT THERAPY. Performed at [facility], [place] [address] [place], [place] [address]   APTT     Status: Abnormal   Collection Time: 12/24/18  8:08 PM  Result Value Ref Range   aPTT 81 (H) 24 - 36 seconds    Comment:        IF BASELINE aPTT IS ELEVATED, SUGGEST PATIENT RISK ASSESSMENT BE USED TO DETERMINE APPROPRIATE ANTICOAGULANT THERAPY. Performed at [facility], [place] [address] [place], [place] [address]   CBC     Status: Abnormal   Collection Time: 12/25/18  3:45 AM  Result Value Ref Range   WBC 11.6 (H) 4.0 - 10.5 K/uL    RBC 4.67 4.22 - 5.81 MIL/uL   Hemoglobin 13.8 13.0 - 17.0 g/dL   HCT 42.3 39.0 - 52.0 %   MCV 90.6 80.0 - 100.0 fL   MCH 29.6 26.0 - 34.0 pg   MCHC 32.6 30.0 - 36.0 g/dL   RDW 13.2 11.5 - 15.5 %   Platelets 193 150 - 400 K/uL   nRBC 0.0 0.0 - 0.2 %    Comment: Performed at [facility], [place] [address] [place], [place] [address]  APTT     Status: Abnormal   Collection Time: 12/25/18  3:45 AM  Result Value Ref Range   aPTT 77 (H) 24 - 36 seconds    Comment:        IF BASELINE aPTT IS ELEVATED, SUGGEST PATIENT RISK ASSESSMENT BE USED TO DETERMINE APPROPRIATE ANTICOAGULANT THERAPY. Performed at [facility], [place] [address] [place], [place] [address]   Heparin level (unfractionated)     Status: Abnormal   Collection Time: 12/25/18  3:45 AM  Result Value Ref Range   Heparin Unfractionated 0.98 (H) 0.30 - 0.70 IU/mL    Comment: RESULTS CONFIRMED BY MANUAL DILUTION (NOTE) If heparin results are below expected values, and patient dosage has  been confirmed, suggest follow up testing of antithrombin III levels. Performed at [facility], [place] [address], [place], [place] [address]     Radiology/Results: No results found.  Anti-infectives: Anti-infectives (From admission, onward)   Start     Dose/Rate Route Frequency Ordered Stop   12/20/18 2200  piperacillin-tazobactam (ZOSYN) IVPB 3.375 g     3.375 g 12.5 mL/hr over 240 Minutes Intravenous Every 8 hours 12/20/18 1834     12/20/18 0645  piperacillin-tazobactam (ZOSYN) IVPB 3.375 g  Status:  Discontinued     3.375 g 100 mL/hr over 30 Minutes Intravenous Every 6 hours 12/20/18 0630 12/20/18 1833   12/19/18 2315  piperacillin-tazobactam (ZOSYN) IVPB 3.375 g     3.375 g 100 mL/hr over 30 Minutes Intravenous  Once 12/19/18 2302 12/20/18 0003      Assessment/Plan: Problem List: Patient Active Problem List   Diagnosis Date Noted  . Colon obstruction ([place])   . Pre-operative cardiovascular examination   .  Acute diverticulitis 12/20/2018  . 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  History of DVT (deep vein thrombosis) 09/04/2018  . PAF (paroxysmal atrial fibrillation) (HCC) 09/04/2018  . Rash 05/06/2018  . Elevated uric acid in blood 05/06/2018  . Atypical atrial flutter (HCC) 03/17/2017  . Chronic pain of right knee 03/13/2017  . Typical atrial flutter s/p catheter ablation   . Varicose veins of legs 12/17/2015  . Encounter for health maintenance examination in adult 12/17/2015  . Special screening for malignant neoplasms, colon 12/17/2015  . Vaccine counseling 12/17/2015  . Screening for prostate cancer 12/17/2015  . Knee deformity, acquired, right 12/17/2015  . Gait disturbance 12/17/2015  . Osteoarthritis of right knee 12/17/2015  . Varicose veins of lower extremities with ulcer (HCC) 09/18/2015  . Need for prophylactic vaccination and inoculation against influenza 09/18/2015  . Tinea versicolor 06/15/2015  . Impaired fasting blood sugar 12/28/2014  . Long term current use of anticoagulant therapy 12/28/2014  . Erectile dysfunction 02/19/2012  . Chronic systolic heart failure (HCC) 12/29/2011  . Hypokalemia 12/24/2011  . Lupus anticoagulant positive 12/22/2011  . Nonischemic cardiomyopathy (HCC) 12/16/2011  . Paroxysmal SVT (supraventricular tachycardia) (HCC) 12/16/2011  . History of venous thromboembolism 12/16/2011  . Venous insufficiency 11/04/2011  . Obesity, morbid (HCC) 06/02/2008    Bloating and minimal flatus;  Follow expectantly as patient may require Hartman's 2 Days Post-Op    LOS: 5 days   Matt B. Daphine Deutscher, MD, [facility] [phone] beeper [phone]  12/25/2018 8:14 AM

## 2018-12-25 NOTE — Progress Notes (Signed)
Progress Note  Patient Name: Roberto [name] Date of Encounter: 12/25/2018  Primary Cardiologist: Arvilla Meres, MD   Subjective   Now net positive 2.4L. BP normal. Creatinine improved. Leukocytosis is improving.  Inpatient Medications    Scheduled Meds: . amiodarone  200 mg Oral Daily  . digoxin  0.125 mg Oral Daily  . influenza vac split quadrivalent PF  0.5 mL Intramuscular Tomorrow-1000  . polyethylene glycol  17 g Oral Daily   Continuous Infusions: . sodium chloride Stopped (12/20/18 1319)  . heparin 2,200 Units/hr (12/25/18 3267)  . piperacillin-tazobactam (ZOSYN)  IV 3.375 g (12/25/18 0617)   PRN Meds: sodium chloride, ondansetron **OR** ondansetron (ZOFRAN) IV, oxyCODONE-acetaminophen, sodium chloride flush   Vital Signs    Vitals:   12/24/18 1452 12/24/18 2035 12/25/18 0500 12/25/18 0537  BP: 117/85 102/67  114/71  Pulse: 74 64  64  Resp: 17 16  16   Temp: 98.2 F (36.8 C) 97.9 F (36.6 C)  98.1 F (36.7 C)  TempSrc: Oral Oral  Oral  SpO2: 96% 96%  (!) 89%  Weight:   (!) 150.5 kg   Height:        Intake/Output Summary (Last 24 hours) at 12/25/2018 1023 Last data filed at 12/25/2018 0538 Gross per 24 hour  Intake 1590.46 ml  Output 300 ml  Net 1290.46 ml   Last 3 Weights 12/25/2018 12/24/2018 12/23/2018  Weight (lbs) 331 lb 12.7 oz 332 lb 14.3 oz 333 lb  Weight (kg) 150.5 kg 151 kg 151.048 kg      Telemetry    Non tele  ECG    N/A  Physical Exam   GEN: No acute distress.   Neck: No JVD Cardiac: RRR, no murmurs, rubs, or gallops. Occ irregular beat Respiratory: Clear to auscultation bilaterally. GI: Soft, non-distended  MS: Trace pretibial edema R>L; No deformity. Neuro:  Nonfocal  Psych: Normal affect   Labs    High Sensitivity Troponin:  No results for input(s): TROPONINIHS in the last 720 hours.    Chemistry Recent Labs  Lab 12/19/18 2038 12/21/18 0041 12/22/18 0247 12/23/18 0500 12/24/18 0537  NA 140 140 135 139  138  K 3.6 3.7 3.4* 3.8 3.2*  CL 101 99 97* 97* 97*  CO2 28 28 26 28 30   GLUCOSE 122* 107* 98 122* 116*  BUN 17 16 17  27* 21*  CREATININE 1.42* 1.41* 1.51* 1.81* 1.44*  CALCIUM 8.6* 8.0* 8.2* 8.4* 8.3*  PROT 7.7 6.6  --   --   --   ALBUMIN 3.2* 2.7*  --   --   --   AST 21 18  --   --   --   ALT 13 12  --   --   --   ALKPHOS 45 38  --   --   --   BILITOT 0.9 0.8  --   --   --   GFRNONAA 56* 56* 52* 42* 55*  GFRAA >60 >60 >60 48* >60  ANIONGAP 11 13 12 14 11      Hematology Recent Labs  Lab 12/23/18 0500 12/24/18 0537 12/25/18 0345  WBC 15.6* 13.1* 11.6*  RBC 5.32 4.74 4.67  HGB 15.5 13.7 13.8  HCT 48.2 43.3 42.3  MCV 90.6 91.4 90.6  MCH 29.1 28.9 29.6  MCHC 32.2 31.6 32.6  RDW 13.3 13.2 13.2  PLT 212 192 193    BNPNo results for input(s): BNP, PROBNP in the last 168 hours.   DDimer No results for  input(s): DDIMER in the last 168 hours.   Radiology    No results found.  Cardiac Studies   Echocardiogram 07/20/2018 IMPRESSIONS   1. The left ventricle has severely reduced systolic function, with an ejection fraction of 25-30%. The cavity size was moderately dilated. Left ventricular diastolic Doppler parameters are consistent with pseudonormalization. Left ventricular diffuse  hypokinesis.  2. The right ventricle has normal systolic function. The cavity was normal. There is no increase in right ventricular wall thickness.  3. Left atrial size was mildly dilated.  4. No evidence of mitral valve stenosis. Trivial mitral regurgitation.  5. The aortic valve is tricuspid. No stenosis of the aortic valve.  6. The aortic root is normal in size and structure.  7. The inferior vena cava was dilated in size with <50% respiratory variability. No complete TR doppler jet so unable to estimate PA systolic pressure.  Patient Profile     53 y.o. male with a hx of chronic diastolic HF, h/o of Lv thrombus, nonischemic cardiomyopathy (EF 25-30% 2013, EF 2020 25-30%), ICD placed  08/2018, PSVT (elected for medical therapy over RFA), DVT/ PE, Paroxysmal afib on amiodarone, Aflutter ablation 11/2016, lupus, and morbid obesity initially seen for preop evaluation in setting of abdominal pain/acute diverticulitis. Followed for hx of CHF.   Assessment & Plan    Abdominal pain/acute diverticulits -Flex sig on 12/10. GI recommends liquid diet and antibiotics.  -Eliquis on hold, on IV heparin. Switch back to Eliquis when OK with Surgery/GI.   Chronic systolic heart failure (LVEF 25-30%) -Followed as outpatient in Advanced Heart Failure clinic by Dr. Mahalia Longest. At home on Entresto, spironolactone and Torsemide, all on hold while here. Continues on digoxin. -Pt receiving IV fluids for AKI. SCR was up to 1.81, now improved to 1.44.  -+2.4L - restart aldactone and torsemide 40 mg BID today. -strict I's/O's, daily weights per nursing  Paroxysmal atrial fibrillation -Has hx of Aflutter ablation 11/2016. Now maintained on amiodarone. On Eliquis for stroke risk reduction at home.   Hypokalemia: - repleted - repeat BMET pending - restart aldactone 25 mg daily    For questions or updates, please contact [place] Please consult [email or web address] for contact info under   Pixie Casino, MD, FACC, [place] Director of the Advanced Lipid Disorders &  Cardiovascular Risk Reduction Clinic Diplomate of the American Board of Clinical Lipidology Attending Cardiologist  Direct Dial: [phone]  Fax: [phone]  Website:  [email or web address]  Pixie Casino, MD  12/25/2018, 10:23 AM

## 2018-12-25 NOTE — Progress Notes (Addendum)
PROGRESS NOTE  Roberto [name] JFH:[id number] DOB: [birth year] DOA: 12/19/2018 PCP: [name], MD  HPI/Recap of past 24 hours: Patient seen and examined at bedside.  Patient had constipation and had had no bowel movement for a couple of days he was started on MiraLAX once a day for bowel regimen yesterday.  He stated that he made a small bowel movement today but he feels he still has a lot of stool.    Assessment/Plan: Principal Problem:   Acute diverticulitis Active Problems:   Obesity, morbid (HCC)   Nonischemic cardiomyopathy (HCC)   Paroxysmal SVT (supraventricular tachycardia) (HCC)   Chronic systolic heart failure (HCC)   Chronic pain of right knee   History of DVT (deep vein thrombosis)   PAF (paroxysmal atrial fibrillation) (HCC)   Pre-operative cardiovascular examination   Colon obstruction (HCC)  #1 diverticulitis complicated by intra-abdominal stenosis status post flex sig with congestion mucosal patient is status post decompression.  His white count is trending down.  He was on Zosyn we will continue Zosyn.  The plan is to continue medical therapy and if needed surgical intervention  #2 none ischemic cardiomyopathy with ejection fraction 25 to 30%, AICD placement August 2020.  Continue heart failure management with digoxin and amiodarone.  Patient is on amiodarone torsemide and spironolactone.  Patient is euvolemic is not in distress.  #3 AKI with hypokalemia.  Worsening renal function.  Cautious IV fluid given his nonischemic cardiomyopathy and low ejection fraction.  He is Lasix is on hold.  #4.  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  Patient is on IV heparin bridge.  5. morbid obesity.  Calculated BMI is 50.  6.  Constipation we will increase his MiraLAX to twice daily and I will add Senokot nightly  Code Status: Full  Severity of Illness: The appropriate patient status for this patient is INPATIENT. Inpatient status is judged to  be reasonable and necessary in order to provide the required intensity of service to ensure the patient's safety. [name] presenting symptoms, physical exam findings, and initial radiographic and laboratory data in the context of their chronic comorbidities is felt to place them at high risk for further clinical deterioration. Furthermore, it is not anticipated that the patient will be medically stable for discharge from the hospital within 2 midnights of admission. The following factors support the patient status of inpatient.   " Abscess needing IV antibiotics* I certify that at the point of admission it is my clinical judgment that the patient will require inpatient hospital care spanning beyond 2 midnights from the point of admission due to high intensity of service, high risk for further deterioration and high frequency of surveillance required.*    Family Communication: None  Disposition Plan: Home when stable   Consultants:  Cardiology  Surgery  GI  Procedures:  Flex sig on December 23, 2018  Antimicrobials:  Zosyn  DVT prophylaxis: IV heparin   Objective: Vitals:   12/24/18 2035 12/25/18 0500 12/25/18 0537 12/25/18 1419  BP: 102/67  114/71 111/65  Pulse: 64  64 68  Resp: 16  16 20   Temp: 97.9 F (36.6 C)  98.1 F (36.7 C) 98.6 F (37 C)  TempSrc: Oral  Oral Oral  SpO2: 96%  (!) 89% 96%  Weight:  (!) 150.5 kg    Height:        Intake/Output Summary (Last 24 hours) at 12/25/2018 1543 Last data filed at 12/25/2018 0538 Gross per 24 hour  Intake 840 ml  Output 300 ml  Net 540 ml   Filed Weights   12/23/18 0840 12/24/18 0500 12/25/18 0500  Weight: (!) 151 kg (!) 151 kg (!) 150.5 kg   Body mass index is 50.45 kg/m.  Exam:  . General: 53 y.o. year-old male well developed well nourished in no acute distress.  Alert and oriented x3.  Morbidly obese no distress . Cardiovascular: Regular rate and rhythm with no rubs or gallops.  No thyromegaly or JVD  noted.   [name] Respiratory: Clear to auscultation with no wheezes or rales. Good inspiratory effort. . Abdomen: Soft nontender nondistended with normal bowel sounds x4 quadrants. . Musculoskeletal: No lower extremity edema. 2/4 pulses in all 4 extremities. . Skin: No ulcerative lesions noted or rashes, . Psychiatry: Mood is appropriate for condition and setting    Data Reviewed: CBC: Recent Labs  Lab 12/19/18 2038 12/20/18 1020 12/21/18 0041 12/22/18 0247 12/23/18 0500 12/24/18 0537 12/25/18 0345  WBC 19.7* 16.6* 16.2* 15.9* 15.6* 13.1* 11.6*  NEUTROABS 16.9* 13.8*  --  12.1*  --   --   --   HGB 15.9 14.7 14.8 14.6 15.5 13.7 13.8  HCT 50.3 46.5 45.7 45.0 48.2 43.3 42.3  MCV 91.1 91.5 90.3 88.9 90.6 91.4 90.6  PLT 221 182 178 168 212 192 193   Basic Metabolic Panel: Recent Labs  Lab 12/20/18 1020 12/21/18 0041 12/22/18 0247 12/23/18 0500 12/24/18 0537  NA 141 140 135 139 138  K 3.7 3.7 3.4* 3.8 3.2*  CL 102 99 97* 97* 97*  CO2 28 28 26 28 30   GLUCOSE 131* 107* 98 122* 116*  BUN 20 16 17  27* 21*  CREATININE 1.52* 1.41* 1.51* 1.81* 1.44*  CALCIUM 8.2* 8.0* 8.2* 8.4* 8.3*   GFR: Estimated Creatinine Clearance: 84.9 mL/min (A) (by C-G formula based on SCr of 1.44 mg/dL (H)). Liver Function Tests: Recent Labs  Lab 12/19/18 2038 12/21/18 0041  AST 21 18  ALT 13 12  ALKPHOS 45 38  BILITOT 0.9 0.8  PROT 7.7 6.6  ALBUMIN 3.2* 2.7*   Recent Labs  Lab 12/19/18 2038  LIPASE 27   No results for input(s): AMMONIA in the last 168 hours. Coagulation Profile: No results for input(s): INR, PROTIME in the last 168 hours. Cardiac Enzymes: No results for input(s): CKTOTAL, CKMB, CKMBINDEX, TROPONINI in the last 168 hours. BNP (last 3 results) No results for input(s): PROBNP in the last 8760 hours. HbA1C: No results for input(s): HGBA1C in the last 72 hours. CBG: No results for input(s): GLUCAP in the last 168 hours. Lipid Profile: No results for input(s): CHOL, HDL,  LDLCALC, TRIG, CHOLHDL, LDLDIRECT in the last 72 hours. Thyroid Function Tests: No results for input(s): TSH, T4TOTAL, FREET4, T3FREE, THYROIDAB in the last 72 hours. Anemia Panel: No results for input(s): VITAMINB12, FOLATE, FERRITIN, TIBC, IRON, RETICCTPCT in the last 72 hours. Urine analysis:    Component Value Date/Time   COLORURINE YELLOW 12/19/2018 2038   APPEARANCEUR CLEAR 12/19/2018 2038   LABSPEC >1.030 (H) 12/19/2018 2038   LABSPEC 1.025 03/18/2017 1337   PHURINE 5.5 12/19/2018 2038   GLUCOSEU NEGATIVE 12/19/2018 2038   HGBUR MODERATE (A) 12/19/2018 2038   BILIRUBINUR SMALL (A) 12/19/2018 2038   BILIRUBINUR negative 03/18/2017 1337   BILIRUBINUR neg 12/17/2015 0944   KETONESUR NEGATIVE 12/19/2018 2038   PROTEINUR >300 (A) 12/19/2018 2038   UROBILINOGEN negative 12/17/2015 0944   UROBILINOGEN 0.2 12/16/2011 1208   NITRITE NEGATIVE 12/19/2018 2038  LEUKOCYTESUR NEGATIVE 12/19/2018 2038   Sepsis Labs: @LABRCNTIP (procalcitonin:4,lacticidven:4)  ) Recent Results (from the past 240 hour(s))  SARS CORONAVIRUS 2 (TAT 6-24 HRS) Nasopharyngeal Nasopharyngeal Swab     Status: None   Collection Time: 12/19/18 11:18 PM   Specimen: Nasopharyngeal Swab  Result Value Ref Range Status   SARS Coronavirus 2 NEGATIVE NEGATIVE Final    Comment: (NOTE) SARS-CoV-2 target nucleic acids are NOT DETECTED. The SARS-CoV-2 RNA is generally detectable in upper and lower respiratory specimens during the acute phase of infection. Negative results do not preclude SARS-CoV-2 infection, do not rule out co-infections with other pathogens, and should not be used as the sole basis for treatment or other patient management decisions. Negative results must be combined with clinical observations, patient history, and epidemiological information. The expected result is Negative. Fact Sheet for Patients: SugarRoll.be Fact Sheet for Healthcare  Providers: https://www.woods-mathews.com/ This test is not yet approved or cleared by the [place] FDA and  has been authorized for detection and/or diagnosis of SARS-CoV-2 by FDA under an Emergency Use Authorization (EUA). This EUA will remain  in effect (meaning this test can be used) for the duration of the COVID-19 declaration under Section 56 4(b)(1) of the Act, 21 U.S.C. section 360bbb-3(b)(1), unless the authorization is terminated or revoked sooner. Performed at [facility], [place] [address], [place], [place] [address]       Studies: No results found.  Scheduled Meds: . amiodarone  200 mg Oral Daily  . digoxin  0.125 mg Oral Daily  . polyethylene glycol  17 g Oral Daily  . spironolactone  25 mg Oral Daily  . torsemide  40 mg Oral BID    Continuous Infusions: . sodium chloride Stopped (12/20/18 1319)  . heparin 2,200 Units/hr (12/25/18 2094)  . piperacillin-tazobactam (ZOSYN)  IV 3.375 g (12/25/18 1329)     LOS: 5 days     Cristal Deer, MD [facility]  To reach me or the doctor on call, go to: [email or web address] Password [email or web address]  12/25/2018, 3:43 PM

## 2018-12-25 NOTE — Progress Notes (Signed)
ANTICOAGULATION CONSULT NOTE - Follow Up Consult  Pharmacy Consult for Heparin Indication: hx of DVT/PE  Allergies  Allergen Reactions  . Shrimp [Shellfish Allergy] Hives and Itching    Patient Measurements: Height: 5\' 8"  (172.7 cm) Weight: (!) 331 lb 12.7 oz (150.5 kg) IBW/kg (Calculated) : 68.4 Heparin Dosing Weight: 105.2 kg  Vital Signs: Temp: 98.1 F (36.7 C) (12/12 0537) Temp Source: Oral (12/12 0537) BP: 114/71 (12/12 0537) Pulse Rate: 64 (12/12 0537)  Labs: Recent Labs    12/23/18 0500 12/23/18 0500 12/23/18 2220 12/24/18 0537 12/24/18 0652 12/24/18 2008 12/25/18 0345  HGB 15.5  --   --  13.7  --   --  13.8  HCT 48.2  --   --  43.3  --   --  42.3  PLT 212  --   --  192  --   --  193  APTT 40*   < > 38*  --  56* 81* 77*  HEPARINUNFRC >2.20*  --  1.78* 1.58*  --   --  0.98*  CREATININE 1.81*  --   --  1.44*  --   --   --    < > = values in this interval not displayed.    Estimated Creatinine Clearance: 84.9 mL/min (A) (by C-G formula based on SCr of 1.44 mg/dL (H)).   Assessment: 41 yoM admitted for acute diverticulitis. On apixaban 5 mg BID PTA for hx of DVT/PE, last dose taken on 12/6. Pharmacy consulted to bridge with heparin while pt having GI procedures.  aPTT therapeutic at 77 sec. Heparin level continues to be falsely elevated at 0.98. CBC wnl. No bleeding noted.  Goal of Therapy:  aPTT 66-102 seconds Monitor platelets by anticoagulation protocol: Yes   Plan:  Continue heparin infusion at 2200 units/hr Daily aPTT, HL, and s/sx of bleeding F/u need for surgical intervention  Berenice Bouton, PharmD PGY1 Pharmacy Resident Phone until 3:30 pm: [phone] 12/25/2018,7:04 AM

## 2018-12-26 ENCOUNTER — Encounter: Payer: Self-pay | Admitting: Gastroenterology

## 2018-12-26 LAB — CBC
HCT: 43.1 % (ref 39.0–52.0)
Hemoglobin: 13.7 g/dL (ref 13.0–17.0)
MCH: 28.8 pg (ref 26.0–34.0)
MCHC: 31.8 g/dL (ref 30.0–36.0)
MCV: 90.5 fL (ref 80.0–100.0)
Platelets: 201 10*3/uL (ref 150–400)
RBC: 4.76 MIL/uL (ref 4.22–5.81)
RDW: 13.2 % (ref 11.5–15.5)
WBC: 12 10*3/uL — ABNORMAL HIGH (ref 4.0–10.5)
nRBC: 0 % (ref 0.0–0.2)

## 2018-12-26 LAB — APTT
aPTT: 52 seconds — ABNORMAL HIGH (ref 24–36)
aPTT: 74 seconds — ABNORMAL HIGH (ref 24–36)

## 2018-12-26 LAB — BASIC METABOLIC PANEL
Anion gap: 13 (ref 5–15)
BUN: 12 mg/dL (ref 6–20)
CO2: 27 mmol/L (ref 22–32)
Calcium: 8.2 mg/dL — ABNORMAL LOW (ref 8.9–10.3)
Chloride: 98 mmol/L (ref 98–111)
Creatinine, Ser: 1.43 mg/dL — ABNORMAL HIGH (ref 0.61–1.24)
GFR calc Af Amer: >60 mL/min (ref >60)
GFR calc non Af Amer: 56 mL/min — ABNORMAL LOW (ref >60)
Glucose, Bld: 90 mg/dL (ref 70–99)
Potassium: 3 mmol/L — ABNORMAL LOW (ref 3.5–5.1)
Sodium: 138 mmol/L (ref 135–145)

## 2018-12-26 LAB — HEPARIN LEVEL (UNFRACTIONATED): Heparin Unfractionated: 0.85 IU/mL — ABNORMAL HIGH (ref 0.30–0.70)

## 2018-12-26 MED ORDER — POTASSIUM CHLORIDE CRYS ER 20 MEQ PO TBCR
40.0000 meq | EXTENDED_RELEASE_TABLET | Freq: Two times a day (BID) | ORAL | Status: DC
  Administered 2018-12-26 – 2018-12-27 (×3): 40 meq via ORAL
  Filled 2018-12-26 (×4): qty 2

## 2018-12-26 MED ORDER — ALUM & MAG HYDROXIDE-SIMETH 200-200-20 MG/5ML PO SUSP
30.0000 mL | Freq: Four times a day (QID) | ORAL | Status: DC | PRN
  Administered 2018-12-26: 30 mL via ORAL
  Filled 2018-12-26: qty 30

## 2018-12-26 NOTE — Progress Notes (Signed)
PROGRESS NOTE  Roberto [name] KKX:[id number] DOB: 05/20/1965 DOA: 12/19/2018 PCP: Denita Lung, MD  HPI/Recap of past 24 hours: Patient seen and examined at bedside.  Patient had constipation and had had no bowel movement for a couple of days he was started on MiraLAX once a day for bowel regimen yesterday.  He stated that he made a small bowel movement today but he feels he still has a lot of stool.  December 26, 2018. Subjective: Patient seen and examined at bedside he is complaining that he is not made a bowel movement he is also not passing gas.  General surgery came by and has ordered a CT scan.  Assessment/Plan: Principal Problem:   Acute diverticulitis Active Problems:   Obesity, morbid (HCC)   Nonischemic cardiomyopathy (HCC)   Paroxysmal SVT (supraventricular tachycardia) (HCC)   Chronic systolic heart failure (HCC)   Chronic pain of right knee   History of DVT (deep vein thrombosis)   PAF (paroxysmal atrial fibrillation) ([place])   Pre-operative cardiovascular examination   Colon obstruction ([place])  #1 diverticulitis complicated by intra-abdominal stenosis status post flex sig with congestion mucosal patient is status post decompression.  His white count is trending down.  He was on Zosyn we will continue Zosyn.  The plan is to continue medical therapy and if needed surgical intervention  #2 none ischemic cardiomyopathy with ejection fraction 25 to 30%, AICD placement August 2020.  Continue heart failure management with digoxin and amiodarone.  Patient is on amiodarone torsemide and spironolactone.  Patient is euvolemic is not in distress.  #3 AKI with hypokalemia.  Worsening renal function.  Cautious IV fluid given his nonischemic cardiomyopathy and low ejection fraction.  He is Lasix is on hold.  #4.  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  Patient is on IV heparin bridge.  5. morbid obesity.  Calculated BMI is 50.  6.  Constipation we  will increase his MiraLAX to twice daily and I will add Senokot nightly patient has been ordered a CT scan by general surgery who is on board  Code Status: Full  Severity of Illness: The appropriate patient status for this patient is INPATIENT. Inpatient status is judged to be reasonable and necessary in order to provide the required intensity of service to ensure the patient's safety. [name] presenting symptoms, physical exam findings, and initial radiographic and laboratory data in the context of their chronic comorbidities is felt to place them at high risk for further clinical deterioration. Furthermore, it is not anticipated that the patient will be medically stable for discharge from the hospital within 2 midnights of admission. The following factors support the patient status of inpatient.   " Abscess needing IV antibiotics* I certify that at the point of admission it is my clinical judgment that the patient will require inpatient hospital care spanning beyond 2 midnights from the point of admission due to high intensity of service, high risk for further deterioration and high frequency of surveillance required.*    Family Communication: None  Disposition Plan: Home when stable   Consultants:  Cardiology  Surgery  GI  Procedures:  Flex sig on December 23, 2018  Antimicrobials:  Zosyn  DVT prophylaxis: IV heparin   Objective: Vitals:   12/25/18 0537 12/25/18 1419 12/25/18 2151 12/26/18 0504  BP: 114/71 111/65 118/75 113/68  Pulse: 64 68 66 68  Resp: 16 20 20 18   Temp: 98.1 F (36.7 C) 98.6 F (37 C) 98.5 F (  36.9 C) 98.8 F (37.1 C)  TempSrc: Oral Oral Oral Oral  SpO2: (!) 89% 96% 95% 95%  Weight:    (!) 149.3 kg  Height:        Intake/Output Summary (Last 24 hours) at 12/26/2018 0835 Last data filed at 12/25/2018 2144 Gross per 24 hour  Intake 30 ml  Output 625 ml  Net -595 ml   Filed Weights   12/24/18 0500 12/25/18 0500 12/26/18 0504    Weight: (!) 151 kg (!) 150.5 kg (!) 149.3 kg   Body mass index is 50.05 kg/m.  Exam:  . General: 53 y.o. year-old male well developed well nourished in no acute distress.  Alert and oriented x3.  Morbidly obese no distress . Cardiovascular: Regular rate and rhythm with no rubs or gallops.  No thyromegaly or JVD noted.   [name] Respiratory: Clear to auscultation with no wheezes or rales. Good inspiratory effort. . Abdomen: Soft mild tender distended, reduced bowel sounds . Musculoskeletal: No lower extremity edema. 2/4 pulses in all 4 extremities. . Skin: No ulcerative lesions noted or rashes, . Psychiatry: Mood is appropriate for condition and setting    Data Reviewed: CBC: Recent Labs  Lab 12/19/18 2038 12/20/18 1020 12/22/18 0247 12/23/18 0500 12/24/18 0537 12/25/18 0345 12/26/18 0355  WBC 19.7* 16.6* 15.9* 15.6* 13.1* 11.6* 12.0*  NEUTROABS 16.9* 13.8* 12.1*  --   --   --   --   HGB 15.9 14.7 14.6 15.5 13.7 13.8 13.7  HCT 50.3 46.5 45.0 48.2 43.3 42.3 43.1  MCV 91.1 91.5 88.9 90.6 91.4 90.6 90.5  PLT 221 182 168 212 192 193 201   Basic Metabolic Panel: Recent Labs  Lab 12/22/18 0247 12/23/18 0500 12/24/18 0537 12/25/18 1702 12/26/18 0355  NA 135 139 138 138 138  K 3.4* 3.8 3.2* 3.3* 3.0*  CL 97* 97* 97* 98 98  CO2 26 28 30 28 27   GLUCOSE 98 122* 116* 94 90  BUN 17 27* 21* 9 12  CREATININE 1.51* 1.81* 1.44* 1.29* 1.43*  CALCIUM 8.2* 8.4* 8.3* 8.3* 8.2*   GFR: Estimated Creatinine Clearance: 85.2 mL/min (A) (by C-G formula based on SCr of 1.43 mg/dL (H)). Liver Function Tests: Recent Labs  Lab 12/19/18 2038 12/21/18 0041  AST 21 18  ALT 13 12  ALKPHOS 45 38  BILITOT 0.9 0.8  PROT 7.7 6.6  ALBUMIN 3.2* 2.7*   Recent Labs  Lab 12/19/18 2038  LIPASE 27   No results for input(s): AMMONIA in the last 168 hours. Coagulation Profile: No results for input(s): INR, PROTIME in the last 168 hours. Cardiac Enzymes: No results for input(s): CKTOTAL, CKMB,  CKMBINDEX, TROPONINI in the last 168 hours. BNP (last 3 results) No results for input(s): PROBNP in the last 8760 hours. HbA1C: No results for input(s): HGBA1C in the last 72 hours. CBG: No results for input(s): GLUCAP in the last 168 hours. Lipid Profile: No results for input(s): CHOL, HDL, LDLCALC, TRIG, CHOLHDL, LDLDIRECT in the last 72 hours. Thyroid Function Tests: No results for input(s): TSH, T4TOTAL, FREET4, T3FREE, THYROIDAB in the last 72 hours. Anemia Panel: No results for input(s): VITAMINB12, FOLATE, FERRITIN, TIBC, IRON, RETICCTPCT in the last 72 hours. Urine analysis:    Component Value Date/Time   COLORURINE YELLOW 12/19/2018 2038   APPEARANCEUR CLEAR 12/19/2018 2038   LABSPEC >1.030 (H) 12/19/2018 2038   LABSPEC 1.025 03/18/2017 1337   PHURINE 5.5 12/19/2018 2038   GLUCOSEU NEGATIVE 12/19/2018 2038   HGBUR MODERATE (A)  12/19/2018 2038   BILIRUBINUR SMALL (A) 12/19/2018 2038   BILIRUBINUR negative 03/18/2017 1337   BILIRUBINUR neg 12/17/2015 0944   KETONESUR NEGATIVE 12/19/2018 2038   PROTEINUR >300 (A) 12/19/2018 2038   UROBILINOGEN negative 12/17/2015 0944   UROBILINOGEN 0.2 12/16/2011 1208   NITRITE NEGATIVE 12/19/2018 2038   LEUKOCYTESUR NEGATIVE 12/19/2018 2038   Sepsis Labs: @LABRCNTIP (procalcitonin:4,lacticidven:4)  ) Recent Results (from the past 240 hour(s))  SARS CORONAVIRUS 2 (TAT 6-24 HRS) Nasopharyngeal Nasopharyngeal Swab     Status: None   Collection Time: 12/19/18 11:18 PM   Specimen: Nasopharyngeal Swab  Result Value Ref Range Status   SARS Coronavirus 2 NEGATIVE NEGATIVE Final    Comment: (NOTE) SARS-CoV-2 target nucleic acids are NOT DETECTED. The SARS-CoV-2 RNA is generally detectable in upper and lower respiratory specimens during the acute phase of infection. Negative results do not preclude SARS-CoV-2 infection, do not rule out co-infections with other pathogens, and should not be used as the sole basis for treatment or other  patient management decisions. Negative results must be combined with clinical observations, patient history, and epidemiological information. The expected result is Negative. Fact Sheet for Patients: HairSlick.no Fact Sheet for Healthcare Providers: quierodirigir.com This test is not yet approved or cleared by the [place] FDA and  has been authorized for detection and/or diagnosis of SARS-CoV-2 by FDA under an Emergency Use Authorization (EUA). This EUA will remain  in effect (meaning this test can be used) for the duration of the COVID-19 declaration under Section 56 4(b)(1) of the Act, 21 U.S.C. section 360bbb-3(b)(1), unless the authorization is terminated or revoked sooner. Performed at [facility], [address] [place], [place] [address]       Studies: No results found.  Scheduled Meds: . amiodarone  200 mg Oral Daily  . digoxin  0.125 mg Oral Daily  . polyethylene glycol  17 g Oral BID  . senna-docusate  1 tablet Oral QHS  . spironolactone  25 mg Oral Daily  . torsemide  40 mg Oral BID    Continuous Infusions: . sodium chloride Stopped (12/20/18 1319)  . heparin 2,200 Units/hr (12/25/18 1751)  . piperacillin-tazobactam (ZOSYN)  IV 3.375 g (12/26/18 0551)     LOS: 6 days     Myrtie Neither, MD [facility]  To reach me or the doctor on call, go to: [email or web address] Password [facility]  12/26/2018, 8:35 AM

## 2018-12-26 NOTE — Progress Notes (Signed)
   Diuretics restarted yesterday - on home HF meds except Entresto. Creatinine hovering around baseline. Remains hypokalemic today-  Replete.  Pixie Casino, MD, [facility], [place] Director of the Advanced Lipid Disorders &  Cardiovascular Risk Reduction Clinic Diplomate of the American Board of Clinical Lipidology Attending Cardiologist  Direct Dial: [phone]  Fax: [phone]  Website:  [email or web address]

## 2018-12-26 NOTE — Progress Notes (Signed)
Patient ID: Roberto [name], male   DOB: 02/12/1965, 53 y.o.   MRN: [id number] [facility] Progress Note:   3 Days Post-Op  Subjective: Mental status is CLEAR;  Sitting up in chair;  Reports no flatus or BMs since yesterday;  On liquids Objective: Vital signs in last 24 hours: Temp:  [98.5 F (36.9 C)-98.8 F (37.1 C)] 98.8 F (37.1 C) (12/13 0504) Pulse Rate:  [66-68] 68 (12/13 0504) Resp:  [18-20] 18 (12/13 0504) BP: (111-118)/(65-75) 113/68 (12/13 0504) SpO2:  [95 %-96 %] 95 % (12/13 0504) Weight:  [149.3 kg] 149.3 kg (12/13 0504)  Intake/Output from previous day: 12/12 0701 - 12/13 0700 In: 30 [P.O.:30] Out: 625 [Urine:625] Intake/Output this shift: No intake/output data recorded.  Physical Exam: Work of breathing is not labored;  Abdomen is very rotund;  nontender and moderately distended  Lab Results:  Results for orders placed or performed during the hospital encounter of 12/19/18 (from the past 48 hour(s))  APTT     Status: Abnormal   Collection Time: 12/24/18  8:08 PM  Result Value Ref Range   aPTT 81 (H) 24 - 36 seconds    Comment:        IF BASELINE aPTT IS ELEVATED, SUGGEST PATIENT RISK ASSESSMENT BE USED TO DETERMINE APPROPRIATE ANTICOAGULANT THERAPY. Performed at [facility], [address], [place], [place] [address]   CBC     Status: Abnormal   Collection Time: 12/25/18  3:45 AM  Result Value Ref Range   WBC 11.6 (H) 4.0 - 10.5 K/uL   RBC 4.67 4.22 - 5.81 MIL/uL   Hemoglobin 13.8 13.0 - 17.0 g/dL   HCT 09.2 33.0 - 07.6 %   MCV 90.6 80.0 - 100.0 fL   MCH 29.6 26.0 - 34.0 pg   MCHC 32.6 30.0 - 36.0 g/dL   RDW 22.6 33.3 - 54.5 %   Platelets 193 150 - 400 K/uL   nRBC 0.0 0.0 - 0.2 %    Comment: Performed at [facility], [address], [place], [place] [address]  APTT     Status: Abnormal   Collection Time: 12/25/18  3:45 AM  Result Value Ref Range   aPTT 77 (H) 24 - 36 seconds    Comment:        IF BASELINE aPTT IS  ELEVATED, SUGGEST PATIENT RISK ASSESSMENT BE USED TO DETERMINE APPROPRIATE ANTICOAGULANT THERAPY. Performed at [facility], [address] [place], [place] [address]   Heparin level (unfractionated)     Status: Abnormal   Collection Time: 12/25/18  3:45 AM  Result Value Ref Range   Heparin Unfractionated 0.98 (H) 0.30 - 0.70 IU/mL    Comment: RESULTS CONFIRMED BY MANUAL DILUTION (NOTE) If heparin results are below expected values, and patient dosage has  been confirmed, suggest follow up testing of antithrombin III levels. Performed at [facility], [address] [place], [place] [address]   Basic metabolic panel     Status: Abnormal   Collection Time: 12/25/18  5:02 PM  Result Value Ref Range   Sodium 138 135 - 145 mmol/L   Potassium 3.3 (L) 3.5 - 5.1 mmol/L   Chloride 98 98 - 111 mmol/L   CO2 28 22 - 32 mmol/L   Glucose, Bld 94 70 - 99 mg/dL   BUN 9 6 - 20 mg/dL   Creatinine, Ser 8.11 (H) 0.61 - 1.24 mg/dL   Calcium 8.3 (L) 8.9 - 10.3  mg/dL   GFR calc non Af Amer >60 >60 mL/min   GFR calc Af Amer >60 >60 mL/min   Anion gap 12 5 - 15    Comment: Performed at [place] [address] [place], [place] [address]  CBC     Status: Abnormal   Collection Time: 12/26/18  3:55 AM  Result Value Ref Range   WBC 12.0 (H) 4.0 - 10.5 K/uL   RBC 4.76 4.22 - 5.81 MIL/uL   Hemoglobin 13.7 13.0 - 17.0 g/dL   HCT 43.1 39.0 - 52.0 %   MCV 90.5 80.0 - 100.0 fL   MCH 28.8 26.0 - 34.0 pg   MCHC 31.8 30.0 - 36.0 g/dL   RDW 13.2 11.5 - 15.5 %   Platelets 201 150 - 400 K/uL   nRBC 0.0 0.0 - 0.2 %    Comment: Performed at [facility], [place] [address] [place], [place] [address]  APTT     Status: Abnormal   Collection Time: 12/26/18  3:55 AM  Result Value Ref Range   aPTT 52 (H) 24 - 36 seconds    Comment:        IF BASELINE aPTT IS ELEVATED, SUGGEST PATIENT RISK ASSESSMENT BE USED TO DETERMINE APPROPRIATE ANTICOAGULANT THERAPY. Performed at [facility], [place] [address] [place], [place] [address]   Heparin level (unfractionated)     Status: Abnormal   Collection Time: 12/26/18  3:55 AM  Result Value Ref Range   Heparin Unfractionated 0.85 (H) 0.30 - 0.70 IU/mL    Comment: (NOTE) If heparin results are below expected values, and patient dosage has  been confirmed, suggest follow up testing of antithrombin III levels. Performed at [facility], [place] [address], [place], [place] [address]   Basic metabolic panel     Status: Abnormal   Collection Time: 12/26/18  3:55 AM  Result Value Ref Range   Sodium 138 135 - 145 mmol/L   Potassium 3.0 (L) 3.5 - 5.1 mmol/L   Chloride 98 98 - 111 mmol/L   CO2 27 22 - 32 mmol/L   Glucose, Bld 90 70 - 99 mg/dL   BUN 12 6 - 20 mg/dL   Creatinine, Ser 1.43 (H) 0.61 - 1.24 mg/dL   Calcium 8.2 (L) 8.9 - 10.3 mg/dL   GFR calc non Af Amer 56 (L) >60 mL/min   GFR calc Af Amer >60 >60 mL/min   Anion gap 13 5 - 15    Comment: Performed at [facility] [address], [place], [place] [address]    Radiology/Results: No results found.  Anti-infectives: Anti-infectives (From admission, onward)   Start     Dose/Rate Route Frequency Ordered Stop   12/20/18 2200  piperacillin-tazobactam (ZOSYN) IVPB 3.375 g     3.375 g 12.5 mL/hr over 240 Minutes Intravenous Every 8 hours 12/20/18 1834     12/20/18 0645  piperacillin-tazobactam (ZOSYN) IVPB 3.375 g  Status:  Discontinued     3.375 g 100 mL/hr over 30 Minutes Intravenous Every 6 hours 12/20/18 0630 12/20/18 1833   12/19/18 2315  piperacillin-tazobactam (ZOSYN) IVPB 3.375 g     3.375 g 100 mL/hr over 30 Minutes Intravenous  Once 12/19/18 2302 12/20/18 0003      Assessment/Plan: Problem List: Patient Active Problem List   Diagnosis Date Noted  . Colon obstruction ([place])   . Pre-operative cardiovascular examination   . Acute diverticulitis 12/20/2018  . History of  LV (left ventricular) mural thrombus 09/04/2018  . History of DVT  (deep vein thrombosis) 09/04/2018  . PAF (paroxysmal atrial fibrillation) (HCC) 09/04/2018  . Rash 05/06/2018  . Elevated uric acid in blood 05/06/2018  . Atypical atrial flutter (HCC) 03/17/2017  . Chronic pain of right knee 03/13/2017  . Typical atrial flutter s/p catheter ablation   . Varicose veins of legs 12/17/2015  . Encounter for health maintenance examination in adult 12/17/2015  . Special screening for malignant neoplasms, colon 12/17/2015  . Vaccine counseling 12/17/2015  . Screening for prostate cancer 12/17/2015  . Knee deformity, acquired, right 12/17/2015  . Gait disturbance 12/17/2015  . Osteoarthritis of right knee 12/17/2015  . Varicose veins of lower extremities with ulcer (HCC) 09/18/2015  . Need for prophylactic vaccination and inoculation against influenza 09/18/2015  . Tinea versicolor 06/15/2015  . Impaired fasting blood sugar 12/28/2014  . Long term current use of anticoagulant therapy 12/28/2014  . Erectile dysfunction 02/19/2012  . Chronic systolic heart failure (HCC) 12/29/2011  . Hypokalemia 12/24/2011  . Lupus anticoagulant positive 12/22/2011  . Nonischemic cardiomyopathy (HCC) 12/16/2011  . Paroxysmal SVT (supraventricular tachycardia) (HCC) 12/16/2011  . History of venous thromboembolism 12/16/2011  . Venous insufficiency 11/04/2011  . Obesity, morbid (HCC) 06/02/2008    Will repeat CT scan in AM.  Concerned that he has not opened up.   3 Days Post-Op    LOS: 6 days   Matt B. Daphine Deutscher, MD, [facility] [phone] beeper [phone]  12/26/2018 8:03 AM

## 2018-12-26 NOTE — Progress Notes (Signed)
ANTICOAGULATION CONSULT NOTE - Follow Up Consult  Pharmacy Consult for Heparin Indication: hx of DVT/PE  Allergies  Allergen Reactions  . Shrimp [Shellfish Allergy] Hives and Itching    Patient Measurements: Height: 5\' 8"  (172.7 cm) Weight: (!) 329 lb 2.4 oz (149.3 kg) IBW/kg (Calculated) : 68.4 Heparin Dosing Weight: 105.2 kg  Vital Signs: Temp: 98.8 F (37.1 C) (12/13 0504) Temp Source: Oral (12/13 0504) BP: 113/68 (12/13 0504) Pulse Rate: 68 (12/13 0504)  Labs: Recent Labs    12/24/18 0537 12/24/18 2008 12/25/18 0345 12/25/18 1702 12/26/18 0355  HGB 13.7  --  13.8  --  13.7  HCT 43.3  --  42.3  --  43.1  PLT 192  --  193  --  201  APTT  --  81* 77*  --  52*  HEPARINUNFRC 1.58*  --  0.98*  --  0.85*  CREATININE 1.44*  --   --  1.29* 1.43*    Estimated Creatinine Clearance: 85.2 mL/min (A) (by C-G formula based on SCr of 1.43 mg/dL (H)).   Assessment: 27 yoM admitted for acute diverticulitis. On apixaban 5 mg BID PTA for hx of DVT/PE, last dose taken on 12/6. Pharmacy consulted to bridge with heparin while pt having GI procedures.  aPTT subtherapeutic at 52 sec. Heparin level continues to be falsely elevated at 0.85. CBC wnl. No bleeding noted.  Goal of Therapy:  aPTT 66-102 seconds Monitor platelets by anticoagulation protocol: Yes   Plan:  Increase heparin infusion to 2400 units/hr 6 hour aPTT and HL Daily aPTT, HL, and s/sx of bleeding F/u need for surgical intervention  Berenice Bouton, PharmD PGY1 Pharmacy Resident Phone until 3:30 pm: [phone] 12/26/2018,8:47 AM

## 2018-12-26 NOTE — Progress Notes (Signed)
ANTICOAGULATION CONSULT NOTE - Follow Up Consult  Pharmacy Consult for Heparin Indication: hx of DVT/PE  Allergies  Allergen Reactions  . Shrimp [Shellfish Allergy] Hives and Itching    Patient Measurements: Height: 5\' 8"  (172.7 cm) Weight: (!) 329 lb 2.4 oz (149.3 kg) IBW/kg (Calculated) : 68.4 Heparin Dosing Weight: 105.2 kg  Vital Signs: Temp: 98.4 F (36.9 C) (12/13 1321) Temp Source: Oral (12/13 1321) BP: 117/77 (12/13 1321) Pulse Rate: 68 (12/13 1321)  Labs: Recent Labs    12/24/18 0537 12/25/18 0345 12/25/18 1702 12/26/18 0355 12/26/18 1509  HGB 13.7 13.8  --  13.7  --   HCT 43.3 42.3  --  43.1  --   PLT 192 193  --  201  --   APTT  --  77*  --  52* 74*  HEPARINUNFRC 1.58* 0.98*  --  0.85*  --   CREATININE 1.44*  --  1.29* 1.43*  --     Estimated Creatinine Clearance: 85.2 mL/min (A) (by C-G formula based on SCr of 1.43 mg/dL (H)).   Assessment: 60 yoM admitted for acute diverticulitis. On apixaban 5 mg BID PTA for hx of DVT/PE, last dose taken on 12/6. Pharmacy consulted to bridge with heparin while pt having GI procedures.  APTT is therapeutic; no bleeding reported.  Goal of Therapy:  aPTT 66-102 seconds Monitor platelets by anticoagulation protocol: Yes   Plan:  Increase heparin infusion to 2450 units/hr to maintain therapeutic level F/U AM labs  Eileen Croswell D. Mina Marble, PharmD, BCPS, [place] 12/26/2018, 4:10 PM

## 2018-12-27 ENCOUNTER — Inpatient Hospital Stay (HOSPITAL_COMMUNITY): Payer: BC Managed Care – PPO

## 2018-12-27 ENCOUNTER — Other Ambulatory Visit (HOSPITAL_COMMUNITY): Payer: Self-pay

## 2018-12-27 LAB — HEPARIN LEVEL (UNFRACTIONATED): Heparin Unfractionated: 1.42 IU/mL — ABNORMAL HIGH (ref 0.30–0.70)

## 2018-12-27 LAB — CBC
HCT: 45.3 % (ref 39.0–52.0)
Hemoglobin: 14.6 g/dL (ref 13.0–17.0)
MCH: 29.1 pg (ref 26.0–34.0)
MCHC: 32.2 g/dL (ref 30.0–36.0)
MCV: 90.2 fL (ref 80.0–100.0)
Platelets: 210 10*3/uL (ref 150–400)
RBC: 5.02 MIL/uL (ref 4.22–5.81)
RDW: 13.5 % (ref 11.5–15.5)
WBC: 10.5 10*3/uL (ref 4.0–10.5)
nRBC: 0 % (ref 0.0–0.2)

## 2018-12-27 LAB — BASIC METABOLIC PANEL
Anion gap: 15 (ref 5–15)
BUN: 17 mg/dL (ref 6–20)
CO2: 26 mmol/L (ref 22–32)
Calcium: 8.4 mg/dL — ABNORMAL LOW (ref 8.9–10.3)
Chloride: 98 mmol/L (ref 98–111)
Creatinine, Ser: 1.54 mg/dL — ABNORMAL HIGH (ref 0.61–1.24)
GFR calc Af Amer: 59 mL/min — ABNORMAL LOW (ref >60)
GFR calc non Af Amer: 51 mL/min — ABNORMAL LOW (ref >60)
Glucose, Bld: 105 mg/dL — ABNORMAL HIGH (ref 70–99)
Potassium: 3.5 mmol/L (ref 3.5–5.1)
Sodium: 139 mmol/L (ref 135–145)

## 2018-12-27 LAB — TYPE AND SCREEN
ABO/RH(D): AB POS
Antibody Screen: NEGATIVE

## 2018-12-27 LAB — APTT
aPTT: 99 seconds — ABNORMAL HIGH (ref 24–36)
aPTT: 104 seconds — ABNORMAL HIGH (ref 24–36)

## 2018-12-27 LAB — ABO/RH: ABO/RH(D): AB POS

## 2018-12-27 MED ORDER — LIDOCAINE VISCOUS HCL 2 % MT SOLN
OROMUCOSAL | Status: AC
  Administered 2018-12-27: 5 mL via OROMUCOSAL
  Filled 2018-12-27: qty 15

## 2018-12-27 MED ORDER — PHENOL 1.4 % MT LIQD: 1.0000 | OROMUCOSAL | Status: DC | PRN

## 2018-12-27 MED ORDER — IOHEXOL 300 MG/ML  SOLN
100.0000 mL | Freq: Once | INTRAMUSCULAR | Status: AC | PRN
  Administered 2018-12-27: 100 mL via INTRAVENOUS

## 2018-12-27 MED ORDER — LIDOCAINE VISCOUS HCL 2 % MT SOLN
6.0000 mL | Freq: Once | OROMUCOSAL | Status: AC
  Filled 2018-12-27: qty 15

## 2018-12-27 NOTE — Progress Notes (Signed)
4 Days Post-Op    CC: Abdominal pain  Subjective: He is extremely distended no real improvement.  He says he has not had a bowel movement in 2 weeks.  He sees no improvement.  Objective: Vital signs in last 24 hours: Temp:  [98.1 F (36.7 C)-98.6 F (37 C)] 98.6 F (37 C) (12/14 0500) Pulse Rate:  [68-84] 75 (12/14 0503) Resp:  [16-20] 16 (12/14 0500) BP: (113-122)/(64-77) 122/76 (12/14 0500) SpO2:  [93 %-98 %] 94 % (12/14 0503) Weight:  [148.9 kg] 148.9 kg (12/14 0500) Last BM Date: 12/25/18 340 p.o. 310 IV 1070 urine No BM recorded Afebrile vital signs are stable Creatinine 1.54 WBC 10.5 CT today shows worsening distal large bowel obstruction at the level of the descending colon correlating with approximately 5 cm in length segment of irregular annular wall thickening and underlying colonic diverticulosis and mild pericolonic fat stranding.  Findings were indeterminate for malignancy versus diverticular disease.  New diffuse small bowel dilatation with air-fluid levels  Pathology:   FINAL MICROSCOPIC DIAGNOSIS:  A. COLON, DESCENDING, BIOPSY:  - Benign colonic mucosa with mild reactive changes.  - No active inflammation.  - No dysplasia or malignancy.    Intake/Output from previous day: 12/13 0701 - 12/14 0700 In: 651 [P.O.:340; I.V.:261; IV Piggyback:50] Out: 1070 [Urine:1070] Intake/Output this shift: Total I/O In: -  Out: 400 [Urine:400]  General appearance: alert, cooperative and no distress Resp: clear to auscultation bilaterally GI: Huge abdomen distended tight.  He does not have peritonitis.  No flatus, no BM x2 weeks  Lab Results:  Recent Labs    12/26/18 0355 12/27/18 0324  WBC 12.0* 10.5  HGB 13.7 14.6  HCT 43.1 45.3  PLT 201 210    BMET Recent Labs    12/26/18 0355 12/27/18 0324  NA 138 139  K 3.0* 3.5  CL 98 98  CO2 27 26  GLUCOSE 90 105*  BUN 12 17  CREATININE 1.43* 1.54*  CALCIUM 8.2* 8.4*   PT/INR No results for input(s):  LABPROT, INR in the last 72 hours.  Recent Labs  Lab 12/21/18 0041  AST 18  ALT 12  ALKPHOS 38  BILITOT 0.8  PROT 6.6  ALBUMIN 2.7*     Lipase     Component Value Date/Time   LIPASE 27 12/19/2018 2038     Medications: . amiodarone  200 mg Oral Daily  . digoxin  0.125 mg Oral Daily  . polyethylene glycol  17 g Oral BID  . potassium chloride  40 mEq Oral BID  . senna-docusate  1 tablet Oral QHS  . spironolactone  25 mg Oral Daily  . torsemide  40 mg Oral BID   . sodium chloride Stopped (12/20/18 1319)  . heparin 2,450 Units/hr (12/27/18 0528)  . piperacillin-tazobactam (ZOSYN)  IV 3.375 g (12/27/18 0523)    Assessment/Plan CHF EF 25-30% Atrial fibrillation Hx DVT/PE LV mural thrombus Gross anticoagulant Morbid obesityBMI 50.63 CKD creatinine 1.81>>1.44>>1.29>> 1.43>> 1.54 Dehydration  Acute diverticulitis, complicated with intraluminal stenosis - Flex sig 12/10 Dr. Meridee Score - congested mucosa, positive purulent discharge, luminal narrowing - GI recommends liquids/abx -No BM no flatus; CT 12/14: Worsening distal large bowel obstruction at the level of the descending colon 5 cm in length; new diffuse small bowel dilatation with air-fluid levels.  FEN:CLD >> NPO ID: [email or web address] 12/6>>  DVT: Apixaban last dose 12/9, heparin drip Follow-up: TBD  Plan: Review studies with Dr. Dwain Sarna.  I will make him n.p.o. for right now.  LOS: 7 days    Roberto [name] 12/27/2018 Please see Amion

## 2018-12-27 NOTE — Consult Note (Addendum)
[facility] Nurse requested for preoperative stoma site marking  Discussed surgical procedure and stoma creation with patient.  Explained role of the [place] nurse team.  Provided the patient with educational booklet and provided samples of pouching options.  Answered patient's questions.   Examined patient lying and sitting upright in bed in order to place the marking in the patient's visual field, away from any creases or abdominal contour issues and within the rectus muscle.  Attempted to mark below the patient's belt line. His abd is very tight and distended at this time and contours may change after surgery.  Marked for colostomy in the LLQ  __5__ cm to the left of the umbilicus and __7__LT below the umbilicus.  Marked for ileostomy in the RLQ  __5__cm to the right of the umbilicus and  __9__ cm below the umbilicus.  Patient's abdomen cleansed with CHG wipes at site markings, allowed to air dry prior to marking. Pt plans to have surgery performed tomorrow.  [facility] Nurse team will follow up with patient after surgery for continued ostomy care and teaching.   Julien Girt MSN, RN, [place], [place], [place]

## 2018-12-27 NOTE — Progress Notes (Signed)
PROGRESS NOTE  Roberto [name]:[id number] DOB: 12/18/1965 DOA: 12/19/2018 PCP: Denita Lung, MD  HPI/Recap of past 24 hours: Patient seen and examined at bedside along with surgery PA, had CT this AM which shows worsening distal large bowel obstruction. Patient also feels his bloating is worse, pain is stable.  Assessment/Plan: Principal Problem:   Acute diverticulitis Active Problems:   Obesity, morbid (HCC)   Nonischemic cardiomyopathy (HCC)   Paroxysmal SVT (supraventricular tachycardia) (HCC)   Chronic systolic heart failure (HCC)   Chronic pain of right knee   History of DVT (deep vein thrombosis)   PAF (paroxysmal atrial fibrillation) ([place])   Pre-operative cardiovascular examination   Colon obstruction ([place])  #1 diverticulitis complicated by intra-abdominal stenosis status post flex sig 12/10 with congestion mucosal patient is status post decompression.  His white count is trending down.  He was on Zosyn we will continue Zosyn.  Based on CT scan this AM have made him NPO and await further surgery recommendations, he may need resection.  #2 Nonischemic cardiomyopathy with ejection fraction 25 to 30%, AICD placement August 2020.  Continue heart failure management with digoxin and amiodarone.  Patient is on amiodarone torsemide and spironolactone.  Patient is euvolemic is not in distress.  #3 AKI with hypokalemia.  Worsening renal function.  Cautious IV fluid given his nonischemic cardiomyopathy and low ejection fraction.  He is Lasix is on hold.  #4.  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  Patient is on IV heparin bridge as he may need surgery.  5. morbid obesity.  Calculated BMI is 50.  6.  Constipation we will increase his MiraLAX to twice daily and I will add Senokot nightly patient has been ordered a CT scan by general surgery who is on board  Code Status: Full  Severity of Illness: The appropriate patient status for this patient is  INPATIENT. Inpatient status is judged to be reasonable and necessary in order to provide the required intensity of service to ensure the patient's safety. [name] presenting symptoms, physical exam findings, and initial radiographic and laboratory data in the context of their chronic comorbidities is felt to place them at high risk for further clinical deterioration. Furthermore, it is not anticipated that the patient will be medically stable for discharge from the hospital within 2 midnights of admission. The following factors support the patient status of inpatient.   " Abscess needing IV antibiotics* I certify that at the point of admission it is my clinical judgment that the patient will require inpatient hospital care spanning beyond 2 midnights from the point of admission due to high intensity of service, high risk for further deterioration and high frequency of surveillance required.*    Family Communication: None  Disposition Plan: Home when stable  Consultants:  Cardiology  Surgery  GI  Procedures:  Flex sig on December 23, 2018  Antimicrobials:  Zosyn  DVT prophylaxis: IV heparin   Objective: Vitals:   12/26/18 1321 12/26/18 2051 12/27/18 0500 12/27/18 0503  BP: 117/77 113/64 122/76   Pulse: 68 77 84 75  Resp: 20 20 16    Temp: 98.4 F (36.9 C) 98.1 F (36.7 C) 98.6 F (37 C)   TempSrc: Oral Oral Oral   SpO2: 98% 96% 93% 94%  Weight:   (!) 148.9 kg   Height:        Intake/Output Summary (Last 24 hours) at 12/27/2018 0946 Last data filed at 12/27/2018 0842 Gross per 24 hour  Intake 651 ml  Output 1470 ml  Net -819 ml   Filed Weights   12/25/18 0500 12/26/18 0504 12/27/18 0500  Weight: (!) 150.5 kg (!) 149.3 kg (!) 148.9 kg   Body mass index is 49.91 kg/m.  Exam:   General: 53 y.o. year-old male well developed well nourished in no acute distress.  Alert and oriented x3.  Morbidly obese no distress  Cardiovascular: Regular rate and rhythm  with no rubs or gallops.  No thyromegaly or JVD noted.    Respiratory: Clear to auscultation with no wheezes or rales. Good inspiratory effort.  Abdomen: Soft mild tender, very distended but soft, reduced bowel sounds  Musculoskeletal: No lower extremity edema. 2/4 pulses in all 4 extremities.  Skin: No ulcerative lesions noted or rashes,  Psychiatry: Mood is appropriate for condition and setting    Data Reviewed: CBC: Recent Labs  Lab 12/20/18 1020 12/22/18 0247 12/23/18 0500 12/24/18 0537 12/25/18 0345 12/26/18 0355 12/27/18 0324  WBC 16.6* 15.9* 15.6* 13.1* 11.6* 12.0* 10.5  NEUTROABS 13.8* 12.1*  --   --   --   --   --   HGB 14.7 14.6 15.5 13.7 13.8 13.7 14.6  HCT 46.5 45.0 48.2 43.3 42.3 43.1 45.3  MCV 91.5 88.9 90.6 91.4 90.6 90.5 90.2  PLT 182 168 212 192 193 201 210   Basic Metabolic Panel: Recent Labs  Lab 12/23/18 0500 12/24/18 0537 12/25/18 1702 12/26/18 0355 12/27/18 0324  NA 139 138 138 138 139  K 3.8 3.2* 3.3* 3.0* 3.5  CL 97* 97* 98 98 98  CO2 28 30 28 27 26   GLUCOSE 122* 116* 94 90 105*  BUN 27* 21* 9 12 17   CREATININE 1.81* 1.44* 1.29* 1.43* 1.54*  CALCIUM 8.4* 8.3* 8.3* 8.2* 8.4*   GFR: Estimated Creatinine Clearance: 78.9 mL/min (A) (by C-G formula based on SCr of 1.54 mg/dL (H)). Liver Function Tests: Recent Labs  Lab 12/21/18 0041  AST 18  ALT 12  ALKPHOS 38  BILITOT 0.8  PROT 6.6  ALBUMIN 2.7*   No results for input(s): LIPASE, AMYLASE in the last 168 hours. No results for input(s): AMMONIA in the last 168 hours. Coagulation Profile: No results for input(s): INR, PROTIME in the last 168 hours. Cardiac Enzymes: No results for input(s): CKTOTAL, CKMB, CKMBINDEX, TROPONINI in the last 168 hours. BNP (last 3 results) No results for input(s): PROBNP in the last 8760 hours. HbA1C: No results for input(s): HGBA1C in the last 72 hours. CBG: No results for input(s): GLUCAP in the last 168 hours. Lipid Profile: No results for  input(s): CHOL, HDL, LDLCALC, TRIG, CHOLHDL, LDLDIRECT in the last 72 hours. Thyroid Function Tests: No results for input(s): TSH, T4TOTAL, FREET4, T3FREE, THYROIDAB in the last 72 hours. Anemia Panel: No results for input(s): VITAMINB12, FOLATE, FERRITIN, TIBC, IRON, RETICCTPCT in the last 72 hours. Urine analysis:    Component Value Date/Time   COLORURINE YELLOW 12/19/2018 2038   APPEARANCEUR CLEAR 12/19/2018 2038   LABSPEC >1.030 (H) 12/19/2018 2038   LABSPEC 1.025 03/18/2017 1337   PHURINE 5.5 12/19/2018 2038   GLUCOSEU NEGATIVE 12/19/2018 2038   HGBUR MODERATE (A) 12/19/2018 2038   BILIRUBINUR SMALL (A) 12/19/2018 2038   BILIRUBINUR negative 03/18/2017 1337   BILIRUBINUR neg 12/17/2015 0944   KETONESUR NEGATIVE 12/19/2018 2038   PROTEINUR >300 (A) 12/19/2018 2038   UROBILINOGEN negative 12/17/2015 0944   UROBILINOGEN 0.2 12/16/2011 1208   NITRITE NEGATIVE 12/19/2018 2038   LEUKOCYTESUR NEGATIVE 12/19/2018 2038  Sepsis Labs: @LABRCNTIP (procalcitonin:4,lacticidven:4)  ) Recent Results (from the past 240 hour(s))  SARS CORONAVIRUS 2 (TAT 6-24 HRS) Nasopharyngeal Nasopharyngeal Swab     Status: None   Collection Time: 12/19/18 11:18 PM   Specimen: Nasopharyngeal Swab  Result Value Ref Range Status   SARS Coronavirus 2 NEGATIVE NEGATIVE Final    Comment: (NOTE) SARS-CoV-2 target nucleic acids are NOT DETECTED. The SARS-CoV-2 RNA is generally detectable in upper and lower respiratory specimens during the acute phase of infection. Negative results do not preclude SARS-CoV-2 infection, do not rule out co-infections with other pathogens, and should not be used as the sole basis for treatment or other patient management decisions. Negative results must be combined with clinical observations, patient history, and epidemiological information. The expected result is Negative. Fact Sheet for Patients: 14/06/20 Fact Sheet for Healthcare  Providers: HairSlick.no This test is not yet approved or cleared by the quierodirigir.com FDA and  has been authorized for detection and/or diagnosis of SARS-CoV-2 by FDA under an Emergency Use Authorization (EUA). This EUA will remain  in effect (meaning this test can be used) for the duration of the COVID-19 declaration under Section 56 4(b)(1) of the Act, 21 U.S.C. section 360bbb-3(b)(1), unless the authorization is terminated or revoked sooner. Performed at [facility], [address], [place], [place]       Studies: CT ABDOMEN PELVIS W CONTRAST  Result Date: 12/27/2018 CLINICAL DATA:  Inpatient. Bowel obstruction suspected. Sigmoidoscopy performed 12/23/2026 demonstrated congested mucosa with findings suggestive of acute diverticulitis, biopsy negative for malignancy. EXAM: CT ABDOMEN AND PELVIS WITH CONTRAST TECHNIQUE: Multidetector CT imaging of the abdomen and pelvis was performed using the standard protocol following bolus administration of intravenous contrast. CONTRAST:  14/10/2026 OMNIPAQUE IOHEXOL 300 MG/ML  SOLN COMPARISON:  12/19/2018 CT abdomen/pelvis. 12/22/2018 abdominal radiograph. FINDINGS: Lower chest: No significant pulmonary nodules or acute consolidative airspace disease. Mild cardiomegaly. ICD lead tip seen at the right ventricular apex. Hepatobiliary: Normal liver size. Hypodense 4.2 cm posterior right liver mass (series 3/image 18), decreased from 7.8 cm on 10/30/2008 CT, where it was seen to represent a hemangioma. Hypodense caudate lobe 2.5 cm liver mass (series 3/image 21), decreased from 3.0 cm on 10/30/2008 CT, compatible with a benign lesion. Several subcentimeter hypodense lesions scattered throughout the liver are too small to characterize and are unchanged in the interval. No new liver lesions. Normal gallbladder with no radiopaque cholelithiasis. No biliary ductal dilatation. Pancreas: Normal, with no mass or duct dilation.  Spleen: Normal size. No mass. Adrenals/Urinary Tract: Normal adrenals. No hydronephrosis. Stable mild renal cortical scarring in the lower right kidney. No renal masses. Normal bladder. Stomach/Bowel: Small hiatal hernia. Otherwise unremarkable fluid-filled stomach without significant gastric distention. New mild diffuse small bowel dilatation to the level of the ileocecal valve, with air-fluid levels throughout the small bowel loops. Normal appendix. There is prominent dilatation of right, transverse and proximal descending colon with fluid levels throughout the dilated portions of the colon, mildly worsened since 12/19/2018 CT. Ascending colon diameter up to 10.3 cm, previously 9.3 cm. There is an approximately 5 cm in length segment of irregular annular wall thickening in the descending colon (series 6/image 115), correlating with the site of abrupt caliber transition in the colon, not appreciably changed. Mild to moderate diverticulosis is present in the sigmoid colon. There is mild pericolonic fat stranding at the site of caliber transition, unchanged. No pericolonic free air or abscess. Vascular/Lymphatic: Normal caliber abdominal aorta. Patent portal, splenic, hepatic and  renal veins. No pathologically enlarged lymph nodes in the abdomen or pelvis. Reproductive: Normal size prostate. Other: No pneumoperitoneum, ascites or focal fluid collection. Stable moderate fat containing right supraumbilical ventral abdominal hernia. Musculoskeletal: No aggressive appearing focal osseous lesions. Moderate thoracolumbar spondylosis. IMPRESSION: 1. Worsening distal large-bowel obstruction at the level of the descending colon correlating with an approximately 5 cm in length segment of irregular annular wall thickening with underlying colonic diverticulosis and mild pericolonic fat stranding. Imaging findings remain indeterminate for malignant versus diverticular descending colonic stricture. 2. New diffuse small bowel  dilatation with air-fluid levels. 3. No free air or abscess. 4. Chronic findings include: Mild cardiomegaly. Small hiatal hernia. Posterior liver masses, decreased in size since 2010, compatible with benign lesions. Electronically Signed   By: Delbert PhenixJason A Poff M.D.   On: 12/27/2018 09:11    Scheduled Meds:  amiodarone  200 mg Oral Daily   digoxin  0.125 mg Oral Daily   polyethylene glycol  17 g Oral BID   potassium chloride  40 mEq Oral BID   senna-docusate  1 tablet Oral QHS   spironolactone  25 mg Oral Daily   torsemide  40 mg Oral BID    Continuous Infusions:  sodium chloride Stopped (12/20/18 1319)   heparin 2,450 Units/hr (12/27/18 0528)   piperacillin-tazobactam (ZOSYN)  IV 3.375 g (12/27/18 0523)     LOS: 7 days   Roberto [name] Roberto LivingMohammed Nyle Limb, MD [facility]  To reach me or the doctor on call, go to: [email or web address] Password [email or web address]  12/27/2018, 9:46 AM

## 2018-12-27 NOTE — Progress Notes (Addendum)
Pt refused NG tube. CCS informed and stated would make Donne Hazel, MD aware. Donne Hazel, MD aware. See new orders. Will continue to monitor.

## 2018-12-27 NOTE — Progress Notes (Signed)
ANTICOAGULATION CONSULT NOTE - Follow Up Consult  Pharmacy Consult for Heparin Indication: hx of DVT/PE  Allergies  Allergen Reactions  . Shrimp [Shellfish Allergy] Hives and Itching    Patient Measurements: Height: 5\' 8"  (172.7 cm) Weight: (!) 328 lb 4.2 oz (148.9 kg) IBW/kg (Calculated) : 68.4 Heparin Dosing Weight: 105.2 kg  Vital Signs: Temp: 98.6 F (37 C) (12/14 0500) Temp Source: Oral (12/14 0500) BP: 122/76 (12/14 0500) Pulse Rate: 75 (12/14 0503)  Labs: Recent Labs    12/25/18 0345 12/25/18 1702 12/26/18 0355 12/26/18 1509 12/27/18 0324  HGB 13.8  --  13.7  --  14.6  HCT 42.3  --  43.1  --  45.3  PLT 193  --  201  --  210  APTT 77*  --  52* 74* 99*  HEPARINUNFRC 0.98*  --  0.85*  --  1.42*  CREATININE  --  1.29* 1.43*  --  1.54*    Estimated Creatinine Clearance: 78.9 mL/min (A) (by C-G formula based on SCr of 1.54 mg/dL (H)).   Assessment: 53 yoM admitted for acute diverticulitis. On apixaban 5 mg BID PTA for hx of DVT/PE/LV thrombus,  last dose taken on 12/6. Pharmacy consulted to bridge with heparin while pt having GI procedures.  APTT is therapeutic; no bleeding reported.  Goal of Therapy:  aPTT 66-102 seconds Monitor platelets by anticoagulation protocol: Yes   Plan:  Continue  heparin infusion to 2450 units/hr to maintain therapeutic level Will check 8 hr PTT to confirm remains therapeutic and not increasing above goal.  Daily HL, CBC  Nicole Cella, RPh Clinical Pharmacist [phone] Please check AMION for all [place] phone numbers After 10:00 PM, call [place] [phone] 12/27/2018, 8:16 AM

## 2018-12-27 NOTE — Progress Notes (Signed)
Daily Rounding Note  12/27/2018, 9:41 AM  LOS: 7 days   SUBJECTIVE:   Chief complaint:    Colonic obstruction, diverticulitis.  SBO Feels more bloated, uncomfortable.  No nausea/vomiting since PTA.  No BM's or flatus for > 2 days.   Vitals stable.    OBJECTIVE:         Vital signs in last 24 hours:    Temp:  [98.1 F (36.7 C)-98.6 F (37 C)] 98.6 F (37 C) (12/14 0500) Pulse Rate:  [68-84] 75 (12/14 0503) Resp:  [16-20] 16 (12/14 0500) BP: (113-122)/(64-77) 122/76 (12/14 0500) SpO2:  [93 %-98 %] 94 % (12/14 0503) Weight:  [148.9 kg] 148.9 kg (12/14 0500) Last BM Date: 12/25/18 Filed Weights   12/25/18 0500 12/26/18 0504 12/27/18 0500  Weight: (!) 150.5 kg (!) 149.3 kg (!) 148.9 kg   General: obese, uncomfortable, alert   Heart: RRR Chest: clear bil.  No coough. Slight increased WOB Abdomen: tense, distended, BS absent.  Slight tenderness, non-focal.    Extremities: no CCE Neuro/Psych:  Oriented x 3.  Alert.  No gross deficits.    Intake/Output from previous day: 12/13 0701 - 12/14 0700 In: 651 [P.O.:340; I.V.:261; IV Piggyback:50] Out: 1070 [Urine:1070]  Intake/Output this shift: Total I/O In: -  Out: 400 [Urine:400]  Lab Results: Recent Labs    12/25/18 0345 12/26/18 0355 12/27/18 0324  WBC 11.6* 12.0* 10.5  HGB 13.8 13.7 14.6  HCT 42.3 43.1 45.3  PLT 193 201 210   BMET Recent Labs    12/25/18 1702 12/26/18 0355 12/27/18 0324  NA 138 138 139  K 3.3* 3.0* 3.5  CL 98 98 98  CO2 28 27 26   GLUCOSE 94 90 105*  BUN 9 12 17   CREATININE 1.29* 1.43* 1.54*  CALCIUM 8.3* 8.2* 8.4*   LFT No results for input(s): PROT, ALBUMIN, AST, ALT, ALKPHOS, BILITOT, BILIDIR, IBILI in the last 72 hours. PT/INR No results for input(s): LABPROT, INR in the last 72 hours. Hepatitis Panel No results for input(s): HEPBSAG, HCVAB, HEPAIGM, HEPBIGM in the last 72 hours.  Studies/Results: CT ABDOMEN PELVIS W  CONTRAST  Result Date: 12/27/2018 CLINICAL DATA:  Inpatient. Bowel obstruction suspected. Sigmoidoscopy performed 12/23/2026 demonstrated congested mucosa with findings suggestive of acute diverticulitis, biopsy negative for malignancy. EXAM: CT ABDOMEN AND PELVIS WITH CONTRAST TECHNIQUE: Multidetector CT imaging of the abdomen and pelvis was performed using the standard protocol following bolus administration of intravenous contrast. CONTRAST:  1108mL OMNIPAQUE IOHEXOL 300 MG/ML  SOLN COMPARISON:  12/19/2018 CT abdomen/pelvis. 12/22/2018 abdominal radiograph. FINDINGS: Lower chest: No significant pulmonary nodules or acute consolidative airspace disease. Mild cardiomegaly. ICD lead tip seen at the right ventricular apex. Hepatobiliary: Normal liver size. Hypodense 4.2 cm posterior right liver mass (series 3/image 18), decreased from 7.8 cm on 10/30/2008 CT, where it was seen to represent a hemangioma. Hypodense caudate lobe 2.5 cm liver mass (series 3/image 21), decreased from 3.0 cm on 10/30/2008 CT, compatible with a benign lesion. Several subcentimeter hypodense lesions scattered throughout the liver are too small to characterize and are unchanged in the interval. No new liver lesions. Normal gallbladder with no radiopaque cholelithiasis. No biliary ductal dilatation. Pancreas: Normal, with no mass or duct dilation. Spleen: Normal size. No mass. Adrenals/Urinary Tract: Normal adrenals. No hydronephrosis. Stable mild renal cortical scarring in the lower right kidney. No renal masses. Normal bladder. Stomach/Bowel: Small hiatal hernia. Otherwise unremarkable fluid-filled stomach without significant gastric distention. New mild  diffuse small bowel dilatation to the level of the ileocecal valve, with air-fluid levels throughout the small bowel loops. Normal appendix. There is prominent dilatation of right, transverse and proximal descending colon with fluid levels throughout the dilated portions of the colon,  mildly worsened since 12/19/2018 CT. Ascending colon diameter up to 10.3 cm, previously 9.3 cm. There is an approximately 5 cm in length segment of irregular annular wall thickening in the descending colon (series 6/image 115), correlating with the site of abrupt caliber transition in the colon, not appreciably changed. Mild to moderate diverticulosis is present in the sigmoid colon. There is mild pericolonic fat stranding at the site of caliber transition, unchanged. No pericolonic free air or abscess. Vascular/Lymphatic: Normal caliber abdominal aorta. Patent portal, splenic, hepatic and renal veins. No pathologically enlarged lymph nodes in the abdomen or pelvis. Reproductive: Normal size prostate. Other: No pneumoperitoneum, ascites or focal fluid collection. Stable moderate fat containing right supraumbilical ventral abdominal hernia. Musculoskeletal: No aggressive appearing focal osseous lesions. Moderate thoracolumbar spondylosis. IMPRESSION: 1. Worsening distal large-bowel obstruction at the level of the descending colon correlating with an approximately 5 cm in length segment of irregular annular wall thickening with underlying colonic diverticulosis and mild pericolonic fat stranding. Imaging findings remain indeterminate for malignant versus diverticular descending colonic stricture. 2. New diffuse small bowel dilatation with air-fluid levels. 3. No free air or abscess. 4. Chronic findings include: Mild cardiomegaly. Small hiatal hernia. Posterior liver masses, decreased in size since 2010, compatible with benign lesions. Electronically Signed   By: Delbert Phenix M.D.   On: 12/27/2018 09:11    Scheduled Meds: . amiodarone  200 mg Oral Daily  . digoxin  0.125 mg Oral Daily  . polyethylene glycol  17 g Oral BID  . potassium chloride  40 mEq Oral BID  . senna-docusate  1 tablet Oral QHS  . spironolactone  25 mg Oral Daily  . torsemide  40 mg Oral BID   Continuous Infusions: . sodium chloride  Stopped (12/20/18 1319)  . heparin 2,450 Units/hr (12/27/18 0528)  . piperacillin-tazobactam (ZOSYN)  IV 3.375 g (12/27/18 0523)   PRN Meds:.sodium chloride, alum & mag hydroxide-simeth, ondansetron **OR** ondansetron (ZOFRAN) IV, oxyCODONE-acetaminophen, sodium chloride flush   ASSESMENT:   *   Bowel obstruction at level of desc colon.  Suspect this is due to diverticulitis but mass not ruled out.  Cologuard negative 2017, was high risk for procedure given NICM, chronic Eliquis Xray today is worse w SBO and contd colonic obstruction.  WBCs improved, 19.7  >> 12.   Day 9 Zosyn.       PLAN   *  Suspect he will be going to surgery, await surgeon's opinion.      Roberto [name]  12/27/2018, 9:41 AM Phone [phone]

## 2018-12-27 NOTE — Progress Notes (Signed)
ANTICOAGULATION CONSULT NOTE - Follow Up Consult  Pharmacy Consult for Heparin Indication: hx of DVT/PE  Allergies  Allergen Reactions  . Shrimp [Shellfish Allergy] Hives and Itching    Patient Measurements: Height: 5\' 8"  (172.7 cm) Weight: (!) 328 lb 4.2 oz (148.9 kg) IBW/kg (Calculated) : 68.4 Heparin Dosing Weight: 105.2 kg  Vital Signs: Temp: 98 F (36.7 C) (12/14 1340) Temp Source: Oral (12/14 1340) BP: 114/68 (12/14 1340) Pulse Rate: 70 (12/14 1340)  Labs: Recent Labs    12/25/18 0345 12/25/18 1702 12/26/18 0355 12/26/18 1509 12/27/18 0324 12/27/18 1235  HGB 13.8  --  13.7  --  14.6  --   HCT 42.3  --  43.1  --  45.3  --   PLT 193  --  201  --  210  --   APTT 77*  --  52* 74* 99* 104*  HEPARINUNFRC 0.98*  --  0.85*  --  1.42*  --   CREATININE  --  1.29* 1.43*  --  1.54*  --     Estimated Creatinine Clearance: 78.9 mL/min (A) (by C-G formula based on SCr of 1.54 mg/dL (H)).   Assessment: 31 yoM admitted for acute diverticulitis. On apixaban 5 mg BID PTA for hx of DVT/PE/LV thrombus,  last dose taken on 12/6. Pharmacy consulted to bridge with heparin while pt having GI procedures.  APTT is therapeutic; no bleeding reported.  Goal of Therapy:  aPTT 66-102 seconds Monitor platelets by anticoagulation protocol: Yes   Plan:  Continue  heparin infusion to 2450 units/hr to maintain therapeutic level Will check 8 hr PTT to confirm remains therapeutic and not increasing above goal.  Daily HL, CBC  Nicole Cella, RPh Clinical Pharmacist [phone] Please check AMION for all [place] phone numbers After 10:00 PM, call [place] [phone] 12/27/2018, 1:56 PM   ADDENDUM:   The 8 hour aPTT = 104 seconds on heparin 2450 units/hr.  Goal aPTT = 66-102 seconds.  Will reduce heparin rate.  Surgery noted sigmoid colectomy/colostormy  planned for 12/15 AM.  Heparin drip to stop at 0600 tomorrow in prep for surgery.  PLAN:  Decrease heparin drip rate to 2350  units/hr Daily HL, CBC.  F/u tomorrow 12/15, heparin drip to stop at 0600 for surgery.  Pharmacy will follow up anticoagulation plan post op.   Nicole Cella, RPh Clinical Pharmacist Please check AMION for all [place] phone numbers After 10:00 PM, call [place] [phone] 12/27/2018 2:02 PM

## 2018-12-27 NOTE — Anesthesia Preprocedure Evaluation (Addendum)
Anesthesia Evaluation  Patient identified by MRN, date of birth, ID band Patient awake    Reviewed: Allergy & Precautions, NPO status , Patient's Chart, lab work & pertinent test results  History of Anesthesia Complications Negative for: history of anesthetic complications  Airway Mallampati: III  TM Distance: >3 FB Neck ROM: Full  Mouth opening: Limited Mouth Opening  Dental no notable dental hx. (+) Teeth Intact, Dental Advisory Given   Pulmonary neg pulmonary ROS,    Pulmonary exam normal        Cardiovascular +CHF and + DVT  Normal cardiovascular exam+ Cardiac Defibrillator   TTE 07/2018: EF 25-30%, LV diffuse hypokinesis, mild LAE    Neuro/Psych negative neurological ROS  negative psych ROS   GI/Hepatic Neg liver ROS, Large bowel obstruction   Endo/Other  Morbid obesity  Renal/GU negative Renal ROS  negative genitourinary   Musculoskeletal  (+) Arthritis ,   Abdominal   Peds  Hematology negative hematology ROS (+)   Anesthesia Other Findings Day of surgery medications reviewed with patient.  Reproductive/Obstetrics negative OB ROS                           Anesthesia Physical Anesthesia Plan  ASA: IV  Anesthesia Plan: General   Post-op Pain Management:    Induction: Intravenous and Rapid sequence  PONV Risk Score and Plan: 3 and Treatment may vary due to age or medical condition, Ondansetron, Dexamethasone and Midazolam  Airway Management Planned: Oral ETT and Video Laryngoscope Planned  Additional Equipment: Arterial line  Intra-op Plan:   Post-operative Plan: Extubation in OR  Informed Consent: I have reviewed the patients History and Physical, chart, labs and discussed the procedure including the risks, benefits and alternatives for the proposed anesthesia with the patient or authorized representative who has indicated his/her understanding and acceptance.      Dental advisory given  Plan Discussed with: CRNA  Anesthesia Plan Comments:        Anesthesia Quick Evaluation

## 2018-12-27 NOTE — Telephone Encounter (Signed)
Opened in error

## 2018-12-27 NOTE — Progress Notes (Signed)
Patient not seen in rounds due to NG tube placement. Chart review performed. It appears the plan is for surgical management.   Principal Problem:   Acute diverticulitis Active Problems:   Obesity, morbid (HCC)   Nonischemic cardiomyopathy (HCC)   Paroxysmal SVT (supraventricular tachycardia) (HCC)   Chronic systolic heart failure (HCC)   Chronic pain of right knee   History of DVT (deep vein thrombosis)   PAF (paroxysmal atrial fibrillation) (HCC)   Pre-operative cardiovascular examination   Colon obstruction (HCC)   Potassium is low and being repleted.   Cr trending upward, 1.54 today. Will hold spironolactone in anticipation of surgery and Cr increase.   He remains net positive however is likely going for surgery. Cardiology will follow along in the post operative phase.

## 2018-12-27 NOTE — Plan of Care (Signed)

## 2018-12-28 ENCOUNTER — Encounter (HOSPITAL_COMMUNITY): Payer: Self-pay | Admitting: Internal Medicine

## 2018-12-28 ENCOUNTER — Inpatient Hospital Stay (HOSPITAL_COMMUNITY): Payer: BC Managed Care – PPO

## 2018-12-28 ENCOUNTER — Inpatient Hospital Stay (HOSPITAL_COMMUNITY): Payer: BC Managed Care – PPO | Admitting: Anesthesiology

## 2018-12-28 ENCOUNTER — Encounter (HOSPITAL_COMMUNITY)
Admission: EM | Disposition: A | Discharge status: Skilled Nursing Facility | Payer: Self-pay | Source: Home / Self Care | Attending: Internal Medicine

## 2018-12-28 ENCOUNTER — Inpatient Hospital Stay: Payer: Self-pay

## 2018-12-28 DIAGNOSIS — Z9911 Dependence on respirator [ventilator] status: Secondary | ICD-10-CM

## 2018-12-28 DIAGNOSIS — R6521 Severe sepsis with septic shock: Secondary | ICD-10-CM

## 2018-12-28 DIAGNOSIS — I480 Paroxysmal atrial fibrillation: Secondary | ICD-10-CM

## 2018-12-28 DIAGNOSIS — J9690 Respiratory failure, unspecified, unspecified whether with hypoxia or hypercapnia: Secondary | ICD-10-CM

## 2018-12-28 DIAGNOSIS — A419 Sepsis, unspecified organism: Secondary | ICD-10-CM

## 2018-12-28 DIAGNOSIS — K56691 Other complete intestinal obstruction: Secondary | ICD-10-CM

## 2018-12-28 DIAGNOSIS — Z86718 Personal history of other venous thrombosis and embolism: Secondary | ICD-10-CM

## 2018-12-28 DIAGNOSIS — K631 Perforation of intestine (nontraumatic): Secondary | ICD-10-CM

## 2018-12-28 DIAGNOSIS — I428 Other cardiomyopathies: Secondary | ICD-10-CM

## 2018-12-28 DIAGNOSIS — K5792 Diverticulitis of intestine, part unspecified, without perforation or abscess without bleeding: Secondary | ICD-10-CM

## 2018-12-28 DIAGNOSIS — J9601 Acute respiratory failure with hypoxia: Secondary | ICD-10-CM

## 2018-12-28 HISTORY — PX: ILEOSTOMY: SHX1783

## 2018-12-28 HISTORY — PX: COLECTOMY: SHX59

## 2018-12-28 LAB — CBC
HCT: 44.3 % (ref 39.0–52.0)
HCT: 44.3 % (ref 39.0–52.0)
Hemoglobin: 14.1 g/dL (ref 13.0–17.0)
Hemoglobin: 13.9 g/dL (ref 13.0–17.0)
MCH: 29.1 pg (ref 26.0–34.0)
MCH: 29 pg (ref 26.0–34.0)
MCHC: 31.8 g/dL (ref 30.0–36.0)
MCHC: 31.4 g/dL (ref 30.0–36.0)
MCV: 91.5 fL (ref 80.0–100.0)
MCV: 92.3 fL (ref 80.0–100.0)
Platelets: 219 10*3/uL (ref 150–400)
Platelets: 235 10*3/uL (ref 150–400)
RBC: 4.84 MIL/uL (ref 4.22–5.81)
RBC: 4.8 MIL/uL (ref 4.22–5.81)
RDW: 13.8 % (ref 11.5–15.5)
RDW: 13.7 % (ref 11.5–15.5)
WBC: 10.5 10*3/uL (ref 4.0–10.5)
WBC: 10.4 10*3/uL (ref 4.0–10.5)
nRBC: 0 % (ref 0.0–0.2)
nRBC: 0 % (ref 0.0–0.2)

## 2018-12-28 LAB — COMPREHENSIVE METABOLIC PANEL
ALT: 24 U/L (ref 0–44)
AST: 24 U/L (ref 15–41)
Albumin: 2.4 g/dL — ABNORMAL LOW (ref 3.5–5.0)
Alkaline Phosphatase: 26 U/L — ABNORMAL LOW (ref 38–126)
Anion gap: 16 — ABNORMAL HIGH (ref 5–15)
BUN: 19 mg/dL (ref 6–20)
CO2: 22 mmol/L (ref 22–32)
Calcium: 7.9 mg/dL — ABNORMAL LOW (ref 8.9–10.3)
Chloride: 105 mmol/L (ref 98–111)
Creatinine, Ser: 1.93 mg/dL — ABNORMAL HIGH (ref 0.61–1.24)
GFR calc Af Amer: 45 mL/min — ABNORMAL LOW (ref >60)
GFR calc non Af Amer: 39 mL/min — ABNORMAL LOW (ref >60)
Glucose, Bld: 130 mg/dL — ABNORMAL HIGH (ref 70–99)
Potassium: 3.8 mmol/L (ref 3.5–5.1)
Sodium: 143 mmol/L (ref 135–145)
Total Bilirubin: 1.7 mg/dL — ABNORMAL HIGH (ref 0.3–1.2)
Total Protein: 5.4 g/dL — ABNORMAL LOW (ref 6.5–8.1)

## 2018-12-28 LAB — HEMOGLOBIN A1C
Hgb A1c MFr Bld: 6.3 % — ABNORMAL HIGH (ref 4.8–5.6)
Mean Plasma Glucose: 134.11 mg/dL

## 2018-12-28 LAB — POCT I-STAT 7, (LYTES, BLD GAS, ICA,H+H)
Acid-Base Excess: 2 mmol/L (ref 0.0–2.0)
Acid-base deficit: 3 mmol/L — ABNORMAL HIGH (ref 0.0–2.0)
Bicarbonate: 28.1 mmol/L — ABNORMAL HIGH (ref 20.0–28.0)
Bicarbonate: 21.8 mmol/L (ref 20.0–28.0)
Calcium, Ion: 1.07 mmol/L — ABNORMAL LOW (ref 1.15–1.40)
Calcium, Ion: 1.07 mmol/L — ABNORMAL LOW (ref 1.15–1.40)
HCT: 40 % (ref 39.0–52.0)
HCT: 39 % (ref 39.0–52.0)
Hemoglobin: 13.6 g/dL (ref 13.0–17.0)
Hemoglobin: 13.3 g/dL (ref 13.0–17.0)
O2 Saturation: 97 %
O2 Saturation: 99 %
Patient temperature: 100.1
Potassium: 4.1 mmol/L (ref 3.5–5.1)
Potassium: 3.5 mmol/L (ref 3.5–5.1)
Sodium: 142 mmol/L (ref 135–145)
Sodium: 143 mmol/L (ref 135–145)
TCO2: 30 mmol/L (ref 22–32)
TCO2: 23 mmol/L (ref 22–32)
pCO2 arterial: 48.2 mmHg — ABNORMAL HIGH (ref 32.0–48.0)
pCO2 arterial: 38.4 mmHg (ref 32.0–48.0)
pH, Arterial: 7.374 (ref 7.350–7.450)
pH, Arterial: 7.367 (ref 7.350–7.450)
pO2, Arterial: 96 mmHg (ref 83.0–108.0)
pO2, Arterial: 159 mmHg — ABNORMAL HIGH (ref 83.0–108.0)

## 2018-12-28 LAB — PROTIME-INR
INR: 1.5 — ABNORMAL HIGH (ref 0.8–1.2)
Prothrombin Time: 18.2 seconds — ABNORMAL HIGH (ref 11.4–15.2)

## 2018-12-28 LAB — GLUCOSE, CAPILLARY
Glucose-Capillary: 105 mg/dL — ABNORMAL HIGH (ref 70–99)
Glucose-Capillary: 108 mg/dL — ABNORMAL HIGH (ref 70–99)
Glucose-Capillary: 125 mg/dL — ABNORMAL HIGH (ref 70–99)

## 2018-12-28 LAB — SURGICAL PCR SCREEN
MRSA, PCR: NEGATIVE
Staphylococcus aureus: NEGATIVE

## 2018-12-28 LAB — HEPARIN LEVEL (UNFRACTIONATED): Heparin Unfractionated: 1.52 IU/mL — ABNORMAL HIGH (ref 0.30–0.70)

## 2018-12-28 LAB — MAGNESIUM: Magnesium: 1.8 mg/dL (ref 1.7–2.4)

## 2018-12-28 LAB — APTT: aPTT: 106 seconds — ABNORMAL HIGH (ref 24–36)

## 2018-12-28 LAB — LACTIC ACID, PLASMA: Lactic Acid, Venous: 1.7 mmol/L (ref 0.5–1.9)

## 2018-12-28 SURGERY — COLECTOMY, TOTAL
Anesthesia: General | Site: Abdomen

## 2018-12-28 MED ORDER — PHENYLEPHRINE 40 MCG/ML (10ML) SYRINGE FOR IV PUSH (FOR BLOOD PRESSURE SUPPORT)
PREFILLED_SYRINGE | INTRAVENOUS | Status: DC | PRN
  Administered 2018-12-28 (×2): 80 ug via INTRAVENOUS
  Administered 2018-12-28: 120 ug via INTRAVENOUS
  Administered 2018-12-28 (×3): 80 ug via INTRAVENOUS

## 2018-12-28 MED ORDER — DIGOXIN 0.25 MG/ML IJ SOLN
0.1250 mg | Freq: Every day | INTRAMUSCULAR | Status: AC
  Administered 2018-12-29 – 2019-01-12 (×15): 0.125 mg via INTRAVENOUS
  Filled 2018-12-28 (×14): qty 2
  Filled 2018-12-28: qty 0.5

## 2018-12-28 MED ORDER — SODIUM CHLORIDE 0.9% FLUSH
10.0000 mL | INTRAVENOUS | Status: DC | PRN
  Administered 2018-12-29: 19:00:00 10 mL

## 2018-12-28 MED ORDER — DEXMEDETOMIDINE HCL IN NACL 200 MCG/50ML IV SOLN
INTRAVENOUS | Status: DC | PRN
  Administered 2018-12-28: .4 ug/kg/h via INTRAVENOUS

## 2018-12-28 MED ORDER — PANTOPRAZOLE SODIUM 40 MG IV SOLR
40.0000 mg | INTRAVENOUS | Status: DC
  Administered 2018-12-28 – 2019-01-11 (×15): 40 mg via INTRAVENOUS
  Filled 2018-12-28 (×15): qty 40

## 2018-12-28 MED ORDER — PROPOFOL 10 MG/ML IV BOLUS
INTRAVENOUS | Status: DC | PRN
  Administered 2018-12-28: 60 mg via INTRAVENOUS
  Administered 2018-12-28: 40 mg via INTRAVENOUS

## 2018-12-28 MED ORDER — FENTANYL CITRATE (PF) 250 MCG/5ML IJ SOLN
INTRAMUSCULAR | Status: AC
  Filled 2018-12-28: qty 5

## 2018-12-28 MED ORDER — DEXMEDETOMIDINE HCL IN NACL 400 MCG/100ML IV SOLN
0.0000 ug/kg/h | INTRAVENOUS | Status: DC
  Administered 2018-12-28 (×3): 0.7 ug/kg/h via INTRAVENOUS
  Administered 2018-12-29 (×3): 0.6 ug/kg/h via INTRAVENOUS
  Filled 2018-12-28 (×3): qty 100
  Filled 2018-12-28: qty 200
  Filled 2018-12-28 (×2): qty 100

## 2018-12-28 MED ORDER — SUCCINYLCHOLINE CHLORIDE 200 MG/10ML IV SOSY
PREFILLED_SYRINGE | INTRAVENOUS | Status: AC
  Filled 2018-12-28: qty 10

## 2018-12-28 MED ORDER — ROCURONIUM BROMIDE 50 MG/5ML IV SOSY
PREFILLED_SYRINGE | INTRAVENOUS | Status: DC | PRN
  Administered 2018-12-28: 50 mg via INTRAVENOUS
  Administered 2018-12-28: 100 mg via INTRAVENOUS
  Administered 2018-12-28: 50 mg via INTRAVENOUS

## 2018-12-28 MED ORDER — SODIUM CHLORIDE 0.9 % IV SOLN: 250.0000 mL | INTRAVENOUS | Status: DC

## 2018-12-28 MED ORDER — PHENYLEPHRINE HCL-NACL 10-0.9 MG/250ML-% IV SOLN
INTRAVENOUS | Status: DC | PRN
  Administered 2018-12-28: 125 ug/min via INTRAVENOUS
  Administered 2018-12-28: 60 ug/min via INTRAVENOUS

## 2018-12-28 MED ORDER — FENTANYL BOLUS VIA INFUSION
50.0000 ug | INTRAVENOUS | Status: DC | PRN
  Filled 2018-12-28: qty 50

## 2018-12-28 MED ORDER — CALCIUM GLUCONATE-NACL 1-0.675 GM/50ML-% IV SOLN
1.0000 g | Freq: Once | INTRAVENOUS | Status: AC
  Administered 2018-12-28: 14:00:00 1000 mg via INTRAVENOUS
  Filled 2018-12-28: qty 50

## 2018-12-28 MED ORDER — NOREPINEPHRINE 4 MG/250ML-% IV SOLN
0.0000 ug/min | INTRAVENOUS | Status: AC
  Administered 2018-12-28: 5 ug/min via INTRAVENOUS
  Filled 2018-12-28: qty 250

## 2018-12-28 MED ORDER — DEXAMETHASONE SODIUM PHOSPHATE 10 MG/ML IJ SOLN
INTRAMUSCULAR | Status: DC | PRN
  Administered 2018-12-28: 6 mg via INTRAVENOUS

## 2018-12-28 MED ORDER — SUCCINYLCHOLINE CHLORIDE 200 MG/10ML IV SOSY
PREFILLED_SYRINGE | INTRAVENOUS | Status: DC | PRN
  Administered 2018-12-28: 200 mg via INTRAVENOUS

## 2018-12-28 MED ORDER — LACTATED RINGERS IV SOLN: INTRAVENOUS | Status: AC

## 2018-12-28 MED ORDER — PHENYLEPHRINE 40 MCG/ML (10ML) SYRINGE FOR IV PUSH (FOR BLOOD PRESSURE SUPPORT)
PREFILLED_SYRINGE | INTRAVENOUS | Status: AC
  Filled 2018-12-28: qty 10

## 2018-12-28 MED ORDER — MAGNESIUM SULFATE 2 GM/50ML IV SOLN
2.0000 g | Freq: Once | INTRAVENOUS | Status: AC
  Administered 2018-12-28: 2 g via INTRAVENOUS
  Filled 2018-12-28: qty 50

## 2018-12-28 MED ORDER — ALBUMIN HUMAN 5 % IV SOLN: INTRAVENOUS | Status: DC | PRN

## 2018-12-28 MED ORDER — LACTATED RINGERS IV BOLUS
500.0000 mL | Freq: Once | INTRAVENOUS | Status: AC
  Administered 2018-12-28: 18:00:00 500 mL via INTRAVENOUS

## 2018-12-28 MED ORDER — MIDAZOLAM HCL 5 MG/5ML IJ SOLN
INTRAMUSCULAR | Status: DC | PRN
  Administered 2018-12-28: 2 mg via INTRAVENOUS

## 2018-12-28 MED ORDER — ORAL CARE MOUTH RINSE
15.0000 mL | OROMUCOSAL | Status: DC
  Administered 2018-12-28 – 2019-01-01 (×32): 15 mL via OROMUCOSAL

## 2018-12-28 MED ORDER — KETAMINE HCL 10 MG/ML IJ SOLN
INTRAMUSCULAR | Status: DC | PRN
  Administered 2018-12-28: 50 mg via INTRAVENOUS

## 2018-12-28 MED ORDER — HYDROMORPHONE HCL 1 MG/ML IJ SOLN
0.5000 mg | INTRAMUSCULAR | Status: DC | PRN
  Administered 2018-12-28: 13:00:00 1 mg via INTRAVENOUS
  Filled 2018-12-28: qty 1

## 2018-12-28 MED ORDER — CHLORHEXIDINE GLUCONATE 0.12% ORAL RINSE (MEDLINE KIT)
15.0000 mL | Freq: Two times a day (BID) | OROMUCOSAL | Status: DC
  Administered 2018-12-28 – 2018-12-31 (×8): 15 mL via OROMUCOSAL

## 2018-12-28 MED ORDER — LIDOCAINE 2% (20 MG/ML) 5 ML SYRINGE
INTRAMUSCULAR | Status: AC
  Filled 2018-12-28: qty 5

## 2018-12-28 MED ORDER — POTASSIUM CHLORIDE 10 MEQ/100ML IV SOLN
10.0000 meq | INTRAVENOUS | Status: AC
  Administered 2018-12-28 (×2): 10 meq via INTRAVENOUS
  Filled 2018-12-28 (×2): qty 100

## 2018-12-28 MED ORDER — MIDAZOLAM HCL 2 MG/2ML IJ SOLN
INTRAMUSCULAR | Status: AC
  Filled 2018-12-28: qty 2

## 2018-12-28 MED ORDER — LACTATED RINGERS IV SOLN: INTRAVENOUS | Status: DC

## 2018-12-28 MED ORDER — SUGAMMADEX SODIUM 500 MG/5ML IV SOLN
INTRAVENOUS | Status: AC
  Filled 2018-12-28: qty 5

## 2018-12-28 MED ORDER — KETAMINE HCL 50 MG/5ML IJ SOSY
PREFILLED_SYRINGE | INTRAMUSCULAR | Status: AC
  Filled 2018-12-28: qty 5

## 2018-12-28 MED ORDER — SODIUM CHLORIDE 0.9% IV SOLUTION: Freq: Once | INTRAVENOUS | Status: DC

## 2018-12-28 MED ORDER — ONDANSETRON HCL 4 MG/2ML IJ SOLN
INTRAMUSCULAR | Status: AC
  Filled 2018-12-28: qty 2

## 2018-12-28 MED ORDER — SODIUM CHLORIDE 0.9% FLUSH
10.0000 mL | Freq: Two times a day (BID) | INTRAVENOUS | Status: DC
  Administered 2018-12-28 – 2018-12-29 (×3): 10 mL
  Administered 2018-12-30: 40 mL
  Administered 2018-12-30 – 2019-01-09 (×12): 10 mL
  Administered 2019-01-10: 20:00:00 20 mL
  Administered 2019-01-10 – 2019-01-13 (×4): 10 mL
  Administered 2019-01-13: 17:00:00 20 mL
  Administered 2019-01-14 – 2019-01-15 (×2): 10 mL
  Administered 2019-01-15: 20 mL
  Administered 2019-01-16 – 2019-01-24 (×17): 10 mL

## 2018-12-28 MED ORDER — FENTANYL CITRATE (PF) 100 MCG/2ML IJ SOLN
INTRAMUSCULAR | Status: DC | PRN
  Administered 2018-12-28: 50 ug via INTRAVENOUS
  Administered 2018-12-28: 100 ug via INTRAVENOUS
  Administered 2018-12-28 (×2): 50 ug via INTRAVENOUS
  Administered 2018-12-28: 100 ug via INTRAVENOUS

## 2018-12-28 MED ORDER — INSULIN ASPART 100 UNIT/ML ~~LOC~~ SOLN
0.0000 [IU] | SUBCUTANEOUS | Status: DC
  Administered 2018-12-29: 2 [IU] via SUBCUTANEOUS
  Administered 2018-12-30 (×2): 3 [IU] via SUBCUTANEOUS
  Administered 2018-12-30 (×2): 2 [IU] via SUBCUTANEOUS
  Administered 2018-12-30: 04:00:00 1 [IU] via SUBCUTANEOUS
  Administered 2018-12-31 (×2): 3 [IU] via SUBCUTANEOUS
  Administered 2018-12-31 (×3): 2 [IU] via SUBCUTANEOUS
  Administered 2018-12-31 – 2019-01-01 (×5): 3 [IU] via SUBCUTANEOUS
  Administered 2019-01-01 – 2019-01-02 (×3): 2 [IU] via SUBCUTANEOUS
  Administered 2019-01-02: 09:00:00 3 [IU] via SUBCUTANEOUS
  Administered 2019-01-02: 2 [IU] via SUBCUTANEOUS
  Administered 2019-01-02: 3 [IU] via SUBCUTANEOUS
  Administered 2019-01-02: 5 [IU] via SUBCUTANEOUS
  Administered 2019-01-02: 20:00:00 3 [IU] via SUBCUTANEOUS
  Administered 2019-01-03 (×3): 5 [IU] via SUBCUTANEOUS

## 2018-12-28 MED ORDER — FENTANYL CITRATE (PF) 100 MCG/2ML IJ SOLN: 50.0000 ug | Freq: Once | INTRAMUSCULAR | Status: DC

## 2018-12-28 MED ORDER — FLUCONAZOLE IN SODIUM CHLORIDE 400-0.9 MG/200ML-% IV SOLN
400.0000 mg | INTRAVENOUS | Status: AC
  Administered 2018-12-28 – 2019-01-09 (×12): 400 mg via INTRAVENOUS
  Filled 2018-12-28 (×13): qty 200

## 2018-12-28 MED ORDER — PHENYLEPHRINE HCL-NACL 10-0.9 MG/250ML-% IV SOLN
25.0000 ug/min | INTRAVENOUS | Status: DC
  Administered 2018-12-28: 40 ug/min via INTRAVENOUS
  Administered 2018-12-28: 25 ug/min via INTRAVENOUS
  Administered 2018-12-28 – 2018-12-29 (×2): 40 ug/min via INTRAVENOUS
  Filled 2018-12-28 (×4): qty 250

## 2018-12-28 MED ORDER — ROCURONIUM BROMIDE 10 MG/ML (PF) SYRINGE
PREFILLED_SYRINGE | INTRAVENOUS | Status: AC
  Filled 2018-12-28: qty 10

## 2018-12-28 MED ORDER — 0.9 % SODIUM CHLORIDE (POUR BTL) OPTIME
TOPICAL | Status: DC | PRN
  Administered 2018-12-28 (×5): 1000 mL

## 2018-12-28 MED ORDER — HEMOSTATIC AGENTS (NO CHARGE) OPTIME
TOPICAL | Status: DC | PRN
  Administered 2018-12-28 (×2): 1 via TOPICAL

## 2018-12-28 MED ORDER — LIDOCAINE 2% (20 MG/ML) 5 ML SYRINGE
INTRAMUSCULAR | Status: DC | PRN
  Administered 2018-12-28: 100 mg via INTRAVENOUS

## 2018-12-28 MED ORDER — CALCIUM GLUCONATE-NACL 1-0.675 GM/50ML-% IV SOLN
1.0000 g | Freq: Once | INTRAVENOUS | Status: AC
  Administered 2018-12-28: 15:00:00 1000 mg via INTRAVENOUS
  Filled 2018-12-28: qty 50

## 2018-12-28 MED ORDER — LACTATED RINGERS IV BOLUS
500.0000 mL | Freq: Once | INTRAVENOUS | Status: AC
  Administered 2018-12-28: 500 mL via INTRAVENOUS

## 2018-12-28 MED ORDER — ACETAMINOPHEN 650 MG RE SUPP
650.0000 mg | Freq: Four times a day (QID) | RECTAL | Status: DC | PRN
  Administered 2018-12-28 – 2019-01-04 (×5): 650 mg via RECTAL
  Filled 2018-12-28 (×6): qty 1

## 2018-12-28 MED ORDER — FENTANYL 2500MCG IN NS 250ML (10MCG/ML) PREMIX INFUSION
50.0000 ug/h | INTRAVENOUS | Status: DC
  Administered 2018-12-28: 50 ug/h via INTRAVENOUS
  Administered 2018-12-29: 175 ug/h via INTRAVENOUS
  Filled 2018-12-28 (×2): qty 250

## 2018-12-28 MED ORDER — NOREPINEPHRINE 4 MG/250ML-% IV SOLN
2.0000 ug/min | INTRAVENOUS | Status: DC
  Administered 2018-12-28: 13:00:00 7 ug/min via INTRAVENOUS
  Administered 2018-12-29 (×2): 9 ug/min via INTRAVENOUS
  Filled 2018-12-28 (×4): qty 250

## 2018-12-28 MED ORDER — CHLORHEXIDINE GLUCONATE CLOTH 2 % EX PADS
6.0000 | MEDICATED_PAD | Freq: Every day | CUTANEOUS | Status: DC
  Administered 2018-12-28 – 2019-01-24 (×25): 6 via TOPICAL

## 2018-12-28 MED ORDER — DEXAMETHASONE SODIUM PHOSPHATE 10 MG/ML IJ SOLN
INTRAMUSCULAR | Status: AC
  Filled 2018-12-28: qty 1

## 2018-12-28 SURGICAL SUPPLY — 75 items
APL PRP STRL LF DISP 70% ISPRP (MISCELLANEOUS) ×2
APPLIER CLIP 13 LRG OPEN (CLIP) ×4
APR CLP LRG 13 20 CLIP (CLIP) ×2
BIOPATCH RED 1 DISK 7.0 (GAUZE/BANDAGES/DRESSINGS) ×2 IMPLANT
BIOPATCH RED 1IN DISK 7.0MM (GAUZE/BANDAGES/DRESSINGS) ×1
BLADE CLIPPER SURG (BLADE) ×3 IMPLANT
BNDG GAUZE ELAST 4 BULKY (GAUZE/BANDAGES/DRESSINGS) ×3 IMPLANT
CANISTER SUCT 3000ML PPV (MISCELLANEOUS) ×4 IMPLANT
CHLORAPREP W/TINT 26 (MISCELLANEOUS) ×4 IMPLANT
CLIP APPLIE 13 LRG OPEN (CLIP) ×1 IMPLANT
COVER MAYO STAND STRL (DRAPES) ×5 IMPLANT
COVER SURGICAL LIGHT HANDLE (MISCELLANEOUS) ×4 IMPLANT
COVER WAND RF STERILE (DRAPES) ×1 IMPLANT
DRAIN CHANNEL 19F RND (DRAIN) ×3 IMPLANT
DRAPE HALF SHEET 40X57 (DRAPES) ×2 IMPLANT
DRAPE LAPAROSCOPIC ABDOMINAL (DRAPES) ×4 IMPLANT
DRAPE UTILITY XL STRL (DRAPES) ×1 IMPLANT
DRAPE WARM FLUID 44X44 (DRAPES) ×4 IMPLANT
DRSG OPSITE POSTOP 4X10 (GAUZE/BANDAGES/DRESSINGS) IMPLANT
DRSG OPSITE POSTOP 4X8 (GAUZE/BANDAGES/DRESSINGS) IMPLANT
DRSG PAD ABDOMINAL 8X10 ST (GAUZE/BANDAGES/DRESSINGS) ×6 IMPLANT
DRSG TEGADERM 2-3/8X2-3/4 SM (GAUZE/BANDAGES/DRESSINGS) ×3 IMPLANT
ELECT BLADE 6.5 EXT (BLADE) ×4 IMPLANT
ELECT CAUTERY BLADE 6.4 (BLADE) ×8 IMPLANT
ELECT REM PT RETURN 9FT ADLT (ELECTROSURGICAL) ×4
ELECTRODE REM PT RTRN 9FT ADLT (ELECTROSURGICAL) ×2 IMPLANT
EVACUATOR SILICONE 100CC (DRAIN) ×3 IMPLANT
GLOVE BIO SURGEON STRL SZ7 (GLOVE) ×10 IMPLANT
GLOVE BIOGEL PI IND STRL 7.5 (GLOVE) ×3 IMPLANT
GLOVE BIOGEL PI INDICATOR 7.5 (GLOVE) ×2
GLOVE SURG SS PI 6.5 STRL IVOR (GLOVE) ×3 IMPLANT
GOWN STRL REUS W/ TWL LRG LVL3 (GOWN DISPOSABLE) ×9 IMPLANT
GOWN STRL REUS W/TWL LRG LVL3 (GOWN DISPOSABLE) ×12
HANDLE SUCTION POOLE (INSTRUMENTS) ×2 IMPLANT
HEMOSTAT SNOW SURGICEL 2X4 (HEMOSTASIS) ×6 IMPLANT
KIT BASIN OR (CUSTOM PROCEDURE TRAY) ×4 IMPLANT
KIT OSTOMY DRAINABLE 2.75 STR (WOUND CARE) ×3 IMPLANT
KIT SIGMOIDOSCOPE (SET/KITS/TRAYS/PACK) IMPLANT
KIT TURNOVER KIT B (KITS) ×4 IMPLANT
LEGGING LITHOTOMY PAIR STRL (DRAPES) IMPLANT
LIGASURE IMPACT 36 18CM CVD LR (INSTRUMENTS) ×4 IMPLANT
NS IRRIG 1000ML POUR BTL (IV SOLUTION) ×4 IMPLANT
PACK GENERAL/GYN (CUSTOM PROCEDURE TRAY) ×4 IMPLANT
PAD ARMBOARD 7.5X6 YLW CONV (MISCELLANEOUS) ×8 IMPLANT
PENCIL BUTTON HOLSTER BLD 10FT (ELECTRODE) ×3 IMPLANT
PENCIL SMOKE EVACUATOR (MISCELLANEOUS) ×4 IMPLANT
RELOAD PROXIMATE 75MM BLUE (ENDOMECHANICALS) ×8 IMPLANT
RELOAD PROXIMATE TA60MM BLUE (ENDOMECHANICALS) ×4 IMPLANT
RELOAD STAPLE 60 BLU REG PROX (ENDOMECHANICALS) IMPLANT
RELOAD STAPLE 75 3.8 BLU REG (ENDOMECHANICALS) IMPLANT
SPONGE LAP 18X18 RF (DISPOSABLE) ×9 IMPLANT
STAPLER CUT CVD 40MM GREEN (STAPLE) ×3 IMPLANT
STAPLER GUN LINEAR PROX 60 (STAPLE) ×3 IMPLANT
STAPLER PROXIMATE 75MM BLUE (STAPLE) ×3 IMPLANT
STAPLER VISISTAT 35W (STAPLE) ×4 IMPLANT
SUCTION POOLE HANDLE (INSTRUMENTS) ×4
SURGILUBE 2OZ TUBE FLIPTOP (MISCELLANEOUS) IMPLANT
SUT ETHILON 2 0 FS 18 (SUTURE) ×3 IMPLANT
SUT NOVA 1 T20/GS 25DT (SUTURE) ×3 IMPLANT
SUT PDS AB 1 TP1 96 (SUTURE) ×8 IMPLANT
SUT PROLENE 2 0 CT2 30 (SUTURE) ×3 IMPLANT
SUT PROLENE 2 0 KS (SUTURE) IMPLANT
SUT SILK 2 0 SH CR/8 (SUTURE) ×4 IMPLANT
SUT SILK 2 0 TIES 10X30 (SUTURE) ×4 IMPLANT
SUT SILK 3 0 SH CR/8 (SUTURE) ×4 IMPLANT
SUT SILK 3 0 TIES 10X30 (SUTURE) ×4 IMPLANT
SUT VIC AB 3-0 SH 27 (SUTURE) ×4
SUT VIC AB 3-0 SH 27X BRD (SUTURE) ×1 IMPLANT
SYR BULB IRRIGATION 50ML (SYRINGE) ×1 IMPLANT
TOWEL GREEN STERILE (TOWEL DISPOSABLE) ×5 IMPLANT
TRAY FOLEY MTR SLVR 14FR STAT (SET/KITS/TRAYS/PACK) ×3 IMPLANT
TUBE CONNECTING 12'X1/4 (SUCTIONS) ×3
TUBE CONNECTING 12X1/4 (SUCTIONS) ×7 IMPLANT
UNDERPAD 30X30 (UNDERPADS AND DIAPERS) IMPLANT
YANKAUER SUCT BULB TIP NO VENT (SUCTIONS) ×7 IMPLANT

## 2018-12-28 NOTE — Anesthesia Postprocedure Evaluation (Signed)
Anesthesia Post Note  Patient: Roberto [name]  Procedure(s) Performed: TOTAL COLECTOMY (N/A Abdomen) ILEOSTOMY (N/A Abdomen)     Patient location during evaluation: ICU Anesthesia Type: General Level of consciousness: patient remains intubated per anesthesia plan and sedated Pain management: pain level controlled Vital Signs Assessment: post-procedure vital signs reviewed and stable Respiratory status: patient on ventilator - see flowsheet for VS and patient remains intubated per anesthesia plan Cardiovascular status: stable Postop Assessment: no apparent nausea or vomiting Anesthetic complications: no    Last Vitals:  Vitals:   12/28/18 0448 12/28/18 1245  BP: 119/75 117/61  Pulse: 77 90  Resp: 16 (!) 34  Temp: 37.6 C 37.8 C  SpO2: 95% 99%    Last Pain:  Vitals:   12/28/18 1245  TempSrc: Axillary  PainSc:                  Brennan Bailey

## 2018-12-28 NOTE — Transfer of Care (Signed)
Immediate Anesthesia Transfer of Care Note  Patient: Roberto [name]  Procedure(s) Performed: TOTAL COLECTOMY (N/A Abdomen) ILEOSTOMY (N/A Abdomen)  Patient Location: NICU  Anesthesia Type:General  Level of Consciousness: sedated and Patient remains intubated per anesthesia plan  Airway & Oxygen Therapy: Patient remains intubated per anesthesia plan and Patient placed on Ventilator (see vital sign flow sheet for setting)  Post-op Assessment: Report given to RN and Post -op Vital signs reviewed and stable  Post vital signs: Reviewed and stable  Last Vitals:  Vitals Value Taken Time  BP 117/61 12/28/18 1236  Temp    Pulse 90 12/28/18 1243  Resp 19 12/28/18 1243  SpO2 100 % 12/28/18 1243  Vitals shown include unvalidated device data.  Last Pain:  Vitals:   12/28/18 0741  TempSrc:   PainSc: 0-No pain      Patients Stated Pain Goal: 3 (16/10/96 0454)  Complications: No apparent anesthesia complications

## 2018-12-28 NOTE — Progress Notes (Signed)
Pharmacy Consult for TPN Indication: massive bowel resection  Patient Measurements: Height: 5\' 7"  (170.2 cm) Weight: (!) 322 lb 8.5 oz (146.3 kg) IBW/kg (Calculated) : 66.1  Vital Signs: Temp: 100.1 F (37.8 C) (12/15 1245) Temp Source: Axillary (12/15 1245) BP: 117/61 (12/15 1245) Pulse Rate: 90 (12/15 1245) Intake/Output from previous day: 12/14 0701 - 12/15 0700 In: 456.1 [IV Piggyback:456.1] Out: 3250 [Urine:1000; Emesis/NG output:2250] Intake/Output from this shift: Total I/O In: 1895 [I.V.:1395; IV Piggyback:500] Out: 1325 [Urine:125; Emesis/NG output:50; Drains:150; Other:600; Blood:400]  Labs: Recent Labs    12/25/18 1702 12/26/18 0355 12/27/18 0324 12/27/18 1235 12/28/18 0447 12/28/18 1117  WBC  --  12.0* 10.5  --  10.5  --   HGB  --  13.7 14.6  --  14.1 13.6  HCT  --  43.1 45.3  --  44.3 40.0  PLT  --  201 210  --  219  --   APTT  --  52* 99* 104* 106*  --   CREATININE 1.29* 1.43* 1.54*  --   --   --    Estimated Creatinine Clearance: 77.1 mL/min (A) (by C-G formula based on SCr of 1.54 mg/dL (H)).  Assessment: 67 yom s/p exploratory laparotomy, subtotal colectomy and end ileostomy today for acute diverticulitis, complicated with intraluminal stenosis. To start TPN for nutrition support. PICC line ordered. TPN ordered after cut off time of 12:30pm in order to have adequate time to prepare and deliver the TPN. TPN will start tomorrow 12/16.   Plan:  TPN labs and nursing orders placed F/u tomorrow for TPN to start  Timmya Blazier, Rande Lawman 12/28/2018,1:31 PM

## 2018-12-28 NOTE — Progress Notes (Signed)
Day of Surgery   Subjective/Chief Complaint: Feels better with ng tube in   Objective: Vital signs in last 24 hours: Temp:  [98 F (36.7 C)-99.6 F (37.6 C)] 99.6 F (37.6 C) (12/15 0448) Pulse Rate:  [70-78] 77 (12/15 0448) Resp:  [16-20] 16 (12/15 0448) BP: (114-120)/(68-75) 119/75 (12/15 0448) SpO2:  [94 %-95 %] 95 % (12/15 0448) Weight:  [146.3 kg] 146.3 kg (12/15 0541) Last BM Date: 12/25/18  Intake/Output from previous day: 12/14 0701 - 12/15 0700 In: 456.1 [IV Piggyback:456.1] Out: 3250 [Urine:1000; Emesis/NG output:2250] Intake/Output this shift: Total I/O In: -  Out: 50 [Emesis/NG output:50]  GI: mild tender, a little less distended  Lab Results:  Recent Labs    12/27/18 0324 12/28/18 0447  WBC 10.5 10.5  HGB 14.6 14.1  HCT 45.3 44.3  PLT 210 219   BMET Recent Labs    12/26/18 0355 12/27/18 0324  NA 138 139  K 3.0* 3.5  CL 98 98  CO2 27 26  GLUCOSE 90 105*  BUN 12 17  CREATININE 1.43* 1.54*  CALCIUM 8.2* 8.4*   PT/INR No results for input(s): LABPROT, INR in the last 72 hours. ABG No results for input(s): PHART, HCO3 in the last 72 hours.  Invalid input(s): PCO2, PO2  Studies/Results: DG Abd 1 View  Result Date: 12/27/2018 CLINICAL DATA:  Nasogastric tube placement EXAM: ABDOMEN - 1 VIEW COMPARISON:  December 22, 2018 FLUOROSCOPY TIME:  1 minutes 0 seconds period 1 acquired image FINDINGS: Nasogastric tube tip and side port in stomach. There are loops of dilated bowel. No free air evident. IMPRESSION: Nasogastric tube tip and side port in stomach. Persistent bowel dilatation. No free air demonstrable. Electronically Signed   By: Lowella Grip III M.D.   On: 12/27/2018 16:31   CT ABDOMEN PELVIS W CONTRAST  Result Date: 12/27/2018 CLINICAL DATA:  Inpatient. Bowel obstruction suspected. Sigmoidoscopy performed 12/23/2026 demonstrated congested mucosa with findings suggestive of acute diverticulitis, biopsy negative for malignancy. EXAM:  CT ABDOMEN AND PELVIS WITH CONTRAST TECHNIQUE: Multidetector CT imaging of the abdomen and pelvis was performed using the standard protocol following bolus administration of intravenous contrast. CONTRAST:  176mL OMNIPAQUE IOHEXOL 300 MG/ML  SOLN COMPARISON:  12/19/2018 CT abdomen/pelvis. 12/22/2018 abdominal radiograph. FINDINGS: Lower chest: No significant pulmonary nodules or acute consolidative airspace disease. Mild cardiomegaly. ICD lead tip seen at the right ventricular apex. Hepatobiliary: Normal liver size. Hypodense 4.2 cm posterior right liver mass (series 3/image 18), decreased from 7.8 cm on 10/30/2008 CT, where it was seen to represent a hemangioma. Hypodense caudate lobe 2.5 cm liver mass (series 3/image 21), decreased from 3.0 cm on 10/30/2008 CT, compatible with a benign lesion. Several subcentimeter hypodense lesions scattered throughout the liver are too small to characterize and are unchanged in the interval. No new liver lesions. Normal gallbladder with no radiopaque cholelithiasis. No biliary ductal dilatation. Pancreas: Normal, with no mass or duct dilation. Spleen: Normal size. No mass. Adrenals/Urinary Tract: Normal adrenals. No hydronephrosis. Stable mild renal cortical scarring in the lower right kidney. No renal masses. Normal bladder. Stomach/Bowel: Small hiatal hernia. Otherwise unremarkable fluid-filled stomach without significant gastric distention. New mild diffuse small bowel dilatation to the level of the ileocecal valve, with air-fluid levels throughout the small bowel loops. Normal appendix. There is prominent dilatation of right, transverse and proximal descending colon with fluid levels throughout the dilated portions of the colon, mildly worsened since 12/19/2018 CT. Ascending colon diameter up to 10.3 cm, previously 9.3 cm. There is  an approximately 5 cm in length segment of irregular annular wall thickening in the descending colon (series 6/image 115), correlating with the  site of abrupt caliber transition in the colon, not appreciably changed. Mild to moderate diverticulosis is present in the sigmoid colon. There is mild pericolonic fat stranding at the site of caliber transition, unchanged. No pericolonic free air or abscess. Vascular/Lymphatic: Normal caliber abdominal aorta. Patent portal, splenic, hepatic and renal veins. No pathologically enlarged lymph nodes in the abdomen or pelvis. Reproductive: Normal size prostate. Other: No pneumoperitoneum, ascites or focal fluid collection. Stable moderate fat containing right supraumbilical ventral abdominal hernia. Musculoskeletal: No aggressive appearing focal osseous lesions. Moderate thoracolumbar spondylosis. IMPRESSION: 1. Worsening distal large-bowel obstruction at the level of the descending colon correlating with an approximately 5 cm in length segment of irregular annular wall thickening with underlying colonic diverticulosis and mild pericolonic fat stranding. Imaging findings remain indeterminate for malignant versus diverticular descending colonic stricture. 2. New diffuse small bowel dilatation with air-fluid levels. 3. No free air or abscess. 4. Chronic findings include: Mild cardiomegaly. Small hiatal hernia. Posterior liver masses, decreased in size since 2010, compatible with benign lesions. Electronically Signed   By: Delbert Phenix M.D.   On: 12/27/2018 09:11    Anti-infectives: Anti-infectives (From admission, onward)   Start     Dose/Rate Route Frequency Ordered Stop   12/20/18 2200  [MAR Hold]  piperacillin-tazobactam (ZOSYN) IVPB 3.375 g     (MAR Hold since Tue 12/28/2018 at 0812.Hold Reason: Transfer to a Procedural area.)   3.375 g 12.5 mL/hr over 240 Minutes Intravenous Every 8 hours 12/20/18 1834     12/20/18 0645  piperacillin-tazobactam (ZOSYN) IVPB 3.375 g  Status:  Discontinued     3.375 g 100 mL/hr over 30 Minutes Intravenous Every 6 hours 12/20/18 0630 12/20/18 1833   12/19/18 2315   piperacillin-tazobactam (ZOSYN) IVPB 3.375 g     3.375 g 100 mL/hr over 30 Minutes Intravenous  Once 12/19/18 2302 12/20/18 0003      Assessment/Plan: CHF EF 25-30% Atrial fibrillation Hx DVT/PE LV mural thrombus Gross anticoagulant Morbid obesityBMI 50.63 CKD creatinine 1.81>>1.44>>1.29>> 1.43>> 1.54 Dehydration  Acute diverticulitis, complicated with intraluminal stenosis - Flex sig 12/10 Dr. Meridee Score -congested mucosa,positive purulent discharge, luminal narrowing - GI recommends liquids/abx -No BM no flatus; CT 12/14: Worsening distal large bowel obstruction at the level of the descending colon 5 cm in length; new diffuse small bowel dilatation with air-fluid levels.  FEN:NPO ID: Zosyn 12/6>> DVT: Apixaban last dose 12/9, heparin drip off this am Follow-up: TBD  Plan: to OR today      Emelia Loron 12/28/2018

## 2018-12-28 NOTE — Progress Notes (Signed)
Pharmacy Antibiotic Note  Roberto [name] is a 53 y.o. male admitted on 12/19/2018 with abdominal pain. Pharmacy has been consulted for fluconazole dosing for possible intra-abdominal infection. Pt with Tmax 100.1 and WBC is WNL. SCr is elevated but stable. S/p subtotal colectomy today.   Plan: Fluconazole 400mg  IV Q24H F/u renal fxn, C&S, clinical status  F/u zosyn LOT  Height: 5\' 7"  (170.2 cm) Weight: (!) 322 lb 8.5 oz (146.3 kg) IBW/kg (Calculated) : 66.1  Temp (24hrs), Avg:99.1 F (37.3 C), Min:98 F (36.7 C), Max:100.1 F (37.8 C)  Recent Labs  Lab 12/23/18 0500 12/24/18 0537 12/25/18 0345 12/25/18 1702 12/26/18 0355 12/27/18 0324 12/28/18 0447  WBC 15.6* 13.1* 11.6*  --  12.0* 10.5 10.5  CREATININE 1.81* 1.44*  --  1.29* 1.43* 1.54*  --     Estimated Creatinine Clearance: 77.1 mL/min (A) (by C-G formula based on SCr of 1.54 mg/dL (H)).    Allergies  Allergen Reactions  . Shrimp [Shellfish Allergy] Hives and Itching    Antimicrobials this admission: Zosyn 12/7> Flucon 12/15>>  Dose adjustments this admission: N/A  Microbiology results: 12/14 MRSA PCR: NEG 12/6 COVID - NEG  Thank you for allowing pharmacy to be a part of this patient's care.  Alphonzo Devera, Rande Lawman 12/28/2018 1:19 PM

## 2018-12-28 NOTE — Progress Notes (Signed)
Initial Nutrition Assessment   RD working remotely.  DOCUMENTATION CODES:   Morbid obesity  INTERVENTION:  TPN per Pharmacy.   RD to continue to monitor.   NUTRITION DIAGNOSIS:   Inadequate oral intake related to inability to eat as evidenced by NPO status.  GOAL:   Patient will meet greater than or equal to 90% of their needs  MONITOR:   Vent status, Skin, Weight trends, Labs, I & O's  REASON FOR ASSESSMENT:   Consult New TPN/TNA  ASSESSMENT:   53 y/o gentleman with a history of chronic HFrEF 2/2 NICM, chronic AC for history of PE and DVT, paoxysmal Afib, and history of LV thrombus (Eliquis PTA) who had a distal bowel obstruction and perforation due to a colonic stricture who underwent ex-lap with a subtotal colectomy and diverting ileostomy. He required vasopressors intraoperatively and was brought to the ICU for continued vent management and septic shock due to intrabdominal sepsis.   Procedure (12/15): Exploratory laparotomy Subtotal colectomy End ileostomy  Patient is currently intubated on ventilator support MV: 11.9 L/min Temp (24hrs), Avg:99.4 F (37.4 C), Min:98.6 F (37 C), Max:100.1 F (37.8 C)  Propofol: none  Per MD, pt with left colonic stricture of unknown etiology, and perforated cecum. Per surgery, pt with mass in the mid descending colon, right colon ischemic and had perforated with  leakage of large amount of stool throughout entire abdominal cavity. TPN has been ordered. Per Pharmacy, TPN to start tomorrow. Noted pt has been on NPO/clear liquid diet since admission (8 days).   Unable to complete Nutrition-Focused physical exam at this time.   Labs and medications reviewed.   Diet Order:   Diet Order            Diet NPO time specified  Diet effective now              EDUCATION NEEDS:   Not appropriate for education at this time  Skin:  Skin Assessment: Skin Integrity Issues: Skin Integrity Issues:: Incisions Incisions:  abdomen  Last BM:  12/12  Height:   Ht Readings from Last 1 Encounters:  12/28/18 5\' 7"  (1.702 m)    Weight:   Wt Readings from Last 1 Encounters:  12/28/18 (!) 146.3 kg    Ideal Body Weight:  67.27 kg  BMI:  Body mass index is 50.52 kg/m.  Estimated Nutritional Needs:   Kcal:  1608-1800  Protein:  165-175 grams  Fluid:  >/= 1.8 L/day    Corrin Parker, MS, RD, LDN Pager # [phone] After hours/ weekend pager # [phone]

## 2018-12-28 NOTE — Progress Notes (Signed)
Peripherally Inserted Central Catheter/Midline Placement  The IV Nurse has discussed with the patient and/or persons authorized to consent for the patient, the purpose of this procedure and the potential benefits and risks involved with this procedure.  The benefits include less needle sticks, lab draws from the catheter, and the patient may be discharged home with the catheter. Risks include, but not limited to, infection, bleeding, blood clot (thrombus formation), and puncture of an artery; nerve damage and irregular heartbeat and possibility to perform a PICC exchange if needed/ordered by physician.  Alternatives to this procedure were also discussed.  Bard Power PICC patient education guide, fact sheet on infection prevention and patient information card has been provided to patient /or left at bedside.    PICC/Midline Placement Documentation  PICC Triple Lumen 12/28/18 PICC Right Brachial 44 cm 1 cm (Active)  Indication for Insertion or Continuance of Line Administration of hyperosmolar/irritating solutions (i.e. TPN, Vancomycin, etc.) 12/28/18 1633  Exposed Catheter (cm) 1 cm 12/28/18 1633  Site Assessment Clean;Dry;Intact 12/28/18 1633  Lumen #1 Status Flushed;Blood return noted 12/28/18 1633  Lumen #2 Status Flushed;Blood return noted 12/28/18 1633  Lumen #3 Status Flushed;Blood return noted 12/28/18 1633  Dressing Type Transparent 12/28/18 1633  Dressing Status Clean;Dry;Intact;Antimicrobial disc in place;Other (Comment) 12/28/18 1633  Dressing Intervention New dressing 12/28/18 3545  Dressing Change Due 01/04/19 12/28/18 1633   Telephone consent signed by Foye Clock Albarece 12/28/2018, 4:34 PM

## 2018-12-28 NOTE — Op Note (Signed)
Preoperative diagnosis: Left colon stricture with dilated ascending colon Postoperative diagnosis: Left colonic stricture of unknown etiology, perforated cecum Procedure: 1.  Exploratory laparotomy 2.  Subtotal colectomy 3.  End ileostomy Surgeon: Dr. Harden Mo Assistant: Wells Guiles, PA Anesthesia: General Estimated blood loss: 100 cc Drains: 19 French Blake drain to the pelvis Specimens: 1.  Right colon #2 transverse and left colon marked stitch superior Complications: None Sponge and count was correct at completion Decision to ICU in critical condition  Indications: This a 53 year old male who has been hospitalized for some time on our service.  He was admitted on December 7.  He underwent evaluation for what appeared to be a left colonic stricture.  He underwent a flexible sigmoidoscopy that showed a very tight stricture and there was some purulence associated with this.  He was attempted to be managed medically over the next number of days.  This failed.  When I saw him Monday he was much more distended I repeated his CT scan that showed worsening.  We stopped his heparin and discussed going to the operating room the next day.  We placed a nasogastric tube to try to decompress him a little as well.  Procedure: After informed consent was obtained and I discussed this with his son he was taken to the operating room.  He had an arterial line placed.  He had a nasogastric tube in place already.  He had a Foley catheter placed.  He was placed under general anesthesia without complication.  His abdomen was prepped and draped in a standard sterile surgical fashion.  A surgical timeout was then performed.  I made a large midline incision.  I carried this through the peritoneum without any injury.  He had a small supraumbilical ventral hernia with omentum incarcerated in it.  I then was able indurate his into his abdomen.  There was a lot of fluid as well as some stool.  I ran the small  bowel and this was all viable although it was quite dilated.  There was a mass in the mid descending colon.  I looked at his right colon and this was ischemic and it had perforated and there was leakage of a large amount of stool throughout his entire abdominal cavity.  I divided the terminal ileum with a GIA stapler.  I then rotated his colon medially with some difficulty due to the amount of stool.  I was able to take down the hepatic flexure and taken back to his transverse colon.  I then elected to remove this side just because it was leaking so much stool that I was unable to control with multiple staple loads as well as sutures.  I came across the mesentery of the terminal ileum and the right colon and the transverse colon with a LigaSure device as well as 2-0 silk sutures.  I then remove this and passed it off the table after dividing the transverse colon with a stapler.  I then remove the omentum from the remainder of the transverse colon.  I took down the white line on the left side.  Associated with the stricture and mass was a reaction and it was adherent to the retroperitoneum that I took down bluntly.  I then rotated this medially.  I took down the splenic flexure with the LigaSure device.  There was no injury to the spleen.  I then remove the left colon using the LigaSure device and 2-0 silk ligatures.  I took this down to the  distal sigmoid colon and upper rectum.  I divided this with a contour stapler.  I then passed this off the table as a specimen.  I placed 2-0 Prolene sutures in the rectal stump.  I then irrigated copiously.  I obtained hemostasis.  There were multiple areas that were just oozing.  I placed a 2 [place] Blake drain in the pelvis and secured this with a 2-0 nylon.  I then where he had been previously marked for an ileostomy cord this out and brought his ileum through this.  It was very difficult to bring his small intestine through his very large abdominal wall.  I had to enlarge  this just to get it so it was viable.  I then closed his abdomen with #1 looped PDS.  I put some intermittent #1 Novafil's and as well.  This had a wet-to-dry placed on it.  I then matured the ileostomy with 3-0 Vicryl suture.  This was viable.  A bag was placed on this.  He was on some vasopressors at the completion of this.  He is good to be left intubated and transferred to the ICU in critical condition.

## 2018-12-28 NOTE — Anesthesia Procedure Notes (Signed)
Arterial Line Insertion Start/End12/15/2020 8:50 AM, 12/28/2018 8:55 AM Performed by: Bryson Corona, CRNA, CRNA  Patient location: Pre-op. Preanesthetic checklist: patient identified, IV checked, site marked, risks and benefits discussed, surgical consent, monitors and equipment checked, pre-op evaluation, timeout performed and anesthesia consent Lidocaine 1% used for infiltration Left, radial was placed Catheter size: 20 Fr Hand hygiene performed  and maximum sterile barriers used   Attempts: 1 Procedure performed without using ultrasound guided technique. Following insertion, dressing applied and Biopatch. Post procedure assessment: normal and unchanged  Patient tolerated the procedure well with no immediate complications.

## 2018-12-28 NOTE — Progress Notes (Addendum)
Spoke to [place], Therapist, sports in PACU and let her know that ICD has been deactivated. Note made in flow sheet. Patient placed on manual defibrillator at bedside. SR on monitor

## 2018-12-28 NOTE — Anesthesia Procedure Notes (Addendum)
Procedure Name: Intubation Date/Time: 12/28/2018 9:31 AM Performed by: Orlie Dakin, CRNA Pre-anesthesia Checklist: Patient identified, Emergency Drugs available, Suction available and Patient being monitored Patient Re-evaluated:Patient Re-evaluated prior to induction Oxygen Delivery Method: Circle system utilized Preoxygenation: Pre-oxygenation with 100% oxygen Induction Type: IV induction and Rapid sequence Laryngoscope Size: Glidescope and 4 Grade View: Grade I Tube type: Oral Tube size: 7.5 mm Number of attempts: 1 Airway Equipment and Method: Stylet and Video-laryngoscopy Placement Confirmation: ETT inserted through vocal cords under direct vision,  positive ETCO2 and breath sounds checked- equal and bilateral Secured at: 24 cm Tube secured with: Tape Dental Injury: Teeth and Oropharynx as per pre-operative assessment  Comments: Glidescope used due to small mouth opening, short, thick neck.  4x4s bite block used at end of case.

## 2018-12-28 NOTE — H&P (Addendum)
NAME:  Roberto [name], MRN:  [id number], DOB:  1965-06-12, LOS: 8 ADMISSION DATE:  12/19/2018, CONSULTATION DATE:  12/28/2018 REFERRING MD: Kirby Crigler, CHIEF COMPLAINT: Left colonic stricture of unknown etiology, perforated cecum   Information obtained  from medical record as patient is intubated and sedated  Brief History   Pt. With history of NICM, EF 25-30%, PSVT, PAF, chronic systolic HF with AICD, hx of DVT/PE/LV thrombus , on chronic anticoagulation ( bridged with heparin)  , was   admitted 12/20/2018 for acute diverticulitis. He was initially treated conservatively, but had no real improvement and was taken to the OR 12/28/2018.Pt was found to have a perforated cecum with leakage of stool throughout abdominal cavity. He was brought from the OR to NICU intubated and on pressors. PCCM have been asked to admit and manage care.  History of present illness   Pt. With history of NICM, EF 25-30%, PSVT, PAF, chronic systolic HF with AICD, hx of DVT/PE/LV thrombus , on chronic anticoagulation ( bridged with heparin)  , was   admitted 12/20/2018 for acute diverticulitis. He was initially treated conservatively, but had no real improvement and was taken to the OR 12/28/2018. Pt had Left colonic stricture of unknown etiology, and perforated cecum. Per surgery's note there was a mass in the mid descending colon. The  right colon  was ischemic and had perforated. There was visualization of leakage of a large amount of stool throughout his entire abdominal cavity.  In OR patient became hypotensive , required pressors  (Phenylephrine and Levo).  Surgery felt the patient was not ready for extubation , and considering co morbidities and possible septic insult   asked PCCM to admit to ICU manage care .  Past Medical History   Past Medical History:  Diagnosis Date  . AICD (automatic cardioverter/defibrillator) present 08/2018  . Arthritis   . Chronic combined systolic and diastolic CHF (congestive heart failure)  (HCC)   . Deep vein thrombosis (HCC)   . Diverticulitis   . Lupus anticoagulant positive   . Morbid obesity (HCC)   . Mural thrombus of heart    coumadin  . Nonischemic cardiomyopathy (HCC)    a) 12/17/11 echo: LVEF 25-30%, grade 3 diastolic dysfunction (c/w restriction), mod MR, mod LA/LV and mild RA dilatation; b) 12/18/11 cMRI: LVEF 38%, mod LV/mild RV dilatation, global HK, mild-mod RV sys dysfxn, no LV thrombus & patchy non-subendocardial delayed enhancement c/w infil dz or prior myocarditis; c. 07/2018 EF 25-30%.  . Paroxysmal SVT (supraventricular tachycardia) (HCC)   . Pulmonary embolism (HCC)    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    Significant Hospital Events   12/20/2018 Admission 12/28/2018 To OR >> perforated cecum with stool contamination to abdomen  Consults:  Surgery GI Cards  Procedures:  12/28/2018 Exploratory laparotomy Subtotal colectomy End ileostomy  Significant Diagnostic Tests:  12/14 CT Abdomen Pelvis Worsening distal large-bowel obstruction at the level of the descending colon correlating with an approximately 5 cm in length segment of irregular annular wall thickening with underlying colonic diverticulosis and mild pericolonic fat stranding. Imaging findings remain indeterminate for malignant versus diverticular descending colonic stricture. New diffuse small bowel dilatation with air-fluid levels. No free air or abscess. Chronic findings include: Mild cardiomegaly. Small hiatal hernia. Posterior liver masses, decreased in size since 2010, compatible with benign lesions.  12/27/2018 KUB Nasogastric tube tip and side port in stomach. Persistent bowel dilatation. No free air demonstrable  12/23/2018  Biopsy >> Colon COLON, DESCENDING, BIOPSY:  -  Benign colonic mucosa with mild reactive changes.  - No active inflammation.  - No dysplasia or malignancy  Micro Data:  12/20/2018  SARS Coronavirus 2 NEGATIVE NEGATIVE   12/20/2018 MRSA, PCR  NEGATIVE NEGATIVE  NEGATIVE   Staphylococcus aureus NEGATIVE NEGATIVE  NEGATIVE    Antimicrobials:  Zosyn 12/20/2018>> Diflucan 12/15>> Interim history/subjective:  Currently pressor dependent on 70% per vent.  Precedex gtt  Lactate 1.7 Objective   Blood pressure 117/61, pulse 90, temperature 100.1 F (37.8 C), temperature source Axillary, resp. rate (!) 34, height 5\' 7"  (1.702 m), weight (!) 146.3 kg, SpO2 99 %.    Vent Mode: PRVC FiO2 (%):  [70 %] 70 % Set Rate:  [18 bmp] 18 bmp Vt Set:  [520 mL] 520 mL PEEP:  [5 cmH20] 5 cmH20 Plateau Pressure:  [16 cmH20] 16 cmH20   Intake/Output Summary (Last 24 hours) at 12/28/2018 1304 Last data filed at 12/28/2018 1245 Gross per 24 hour  Intake 2351.12 ml  Output 3675 ml  Net -1323.88 ml   Filed Weights   12/26/18 0504 12/27/18 0500 12/28/18 0541  Weight: (!) 149.3 kg (!) 148.9 kg (!) 146.3 kg    Examination: General: sedated intubated obese male, hypotensive on pressors HENT: NCAT, PERRLA ( 3mm) MM dry, No JVD, ETT secure and intact Lungs: Bilateral chest excursion, few rhonchi noted, diminished per bases Cardiovascular: S1, S2, RRR, atrial fib with MF PVC's, rate controlled  Abdomen: Obese, distended with L JP drain with BRB drainage, Pink stomal with scant bloody drainage Extremities: Warm and dry, adequate, equal  Bulk, no obvious abnormalities Neuro: Sedated and intubated, Pupils are equal and reactive  GU: Foley  Resolved Hospital Problem list     Assessment & Plan:  Sepsis 2/2 perforated cecum and contamination of abdominal cavity with stool T Max 100.1 WBC 10.5 Hypotensive on multiple pressors Plan Wean pressors for MAP goal of > 65 mmHg 500 cc Ns Bolus LR at 100 x 1 liter than KVO and bolus as needed Lactate now Calcium 1 gram now Continue Zosyn Add Diflucan for anti fungal coverage Trend Lactate to guide fluid resuscitation  NICM, baseline EF 25-30% ( 07/2018 Echo) PSVT, PAF, chronic systolic HF with  AICD Currently in a fib rate controlled with MF PVC's  Chronic Amio at home  Plan Continue Digoxin IV  Monitor I&O>> careful monitoring of volume Consider inotrope if HF worsens   Trend Calcium Consider addition of amio as needed ( Currently rate and rhythm controlled) Restart heparin once surgery ok's  Monitor and follow coags   Acute Respiratory Failure Never smoker Plan   ABG now CXR now Titrate oxygen and PEEP for sats of > 94% No weaning for now  Precedex and Fentanyl for pain RASS goal  -1 Check Mag and replete as needed   Mild AKI Creatinine 1.54  Hypocalcemia Plan Trend BMET Obtain Mag now  Monitor UO Maintain renal perfusion Avoid nephrotoxic medications  GI New Colostomy Abdominal Mass >> Perforated cecum Initial colon biopsy negative for malignancy NPO Plan NPO for now TNA per pharmacy for nutrition Protonix as SUP Zofran for nausea Follow surgical pathology  At risk of anemia Plan Monitor JP drainage Trend CBC Transfuse as needed    Bevelyn NgoSarah F. Jyair Kiraly, MSN, AGACNP-BC [facility] Pulmonary/Critical Care Medicine Pager # [phone] After 4 pm please call [phone] 12/28/2018 2:12 PM   Best practice:  Diet: NPO/ TNA Pain/Anxiety/Delirium protocol (if indicated): Fentanyl/ Precedex VAP protocol (if indicated): NA DVT prophylaxis: PAS hose, no heparin  GI prophylaxis: Protonix  Glucose control: CBG /  SSI Mobility: BR Code Status: FC Family Communication: No family at bedside  Disposition: ICU  Labs   CBC: Recent Labs  Lab 12/22/18 0247 12/24/18 0537 12/25/18 0345 12/26/18 0355 12/27/18 0324 12/28/18 0447 12/28/18 1117  WBC 15.9* 13.1* 11.6* 12.0* 10.5 10.5  --   NEUTROABS 12.1*  --   --   --   --   --   --   HGB 14.6 13.7 13.8 13.7 14.6 14.1 13.6  HCT 45.0 43.3 42.3 43.1 45.3 44.3 40.0  MCV 88.9 91.4 90.6 90.5 90.2 91.5  --   PLT 168 192 193 201 210 219  --     Basic Metabolic Panel: Recent Labs  Lab 12/23/18 0500  12/24/18 0537 12/25/18 1702 12/26/18 0355 12/27/18 0324 12/28/18 1117  NA 139 138 138 138 139 142  K 3.8 3.2* 3.3* 3.0* 3.5 4.1  CL 97* 97* 98 98 98  --   CO2 28 30 28 27 26   --   GLUCOSE 122* 116* 94 90 105*  --   BUN 27* 21* 9 12 17   --   CREATININE 1.81* 1.44* 1.29* 1.43* 1.54*  --   CALCIUM 8.4* 8.3* 8.3* 8.2* 8.4*  --    GFR: Estimated Creatinine Clearance: 77.1 mL/min (A) (by C-G formula based on SCr of 1.54 mg/dL (H)). Recent Labs  Lab 12/25/18 0345 12/26/18 0355 12/27/18 0324 12/28/18 0447  WBC 11.6* 12.0* 10.5 10.5    Liver Function Tests: No results for input(s): AST, ALT, ALKPHOS, BILITOT, PROT, ALBUMIN in the last 168 hours. No results for input(s): LIPASE, AMYLASE in the last 168 hours. No results for input(s): AMMONIA in the last 168 hours.  ABG    Component Value Date/Time   PHART 7.374 12/28/2018 1117   PCO2ART 48.2 (H) 12/28/2018 1117   PO2ART 96.0 12/28/2018 1117   HCO3 28.1 (H) 12/28/2018 1117   TCO2 30 12/28/2018 1117   O2SAT 97.0 12/28/2018 1117     Coagulation Profile: No results for input(s): INR, PROTIME in the last 168 hours.  Cardiac Enzymes: No results for input(s): CKTOTAL, CKMB, CKMBINDEX, TROPONINI in the last 168 hours.  HbA1C: Hgb A1c MFr Bld  Date/Time Value Ref Range Status  09/18/2015 10:13 AM 5.9 (H) <5.7 % Final    Comment:      For someone without known diabetes, a hemoglobin A1c value between 5.7% and 6.4% is consistent with prediabetes and should be confirmed with a follow-up test.   For someone with known diabetes, a value <7% indicates that their diabetes is well controlled. A1c targets should be individualized based on duration of diabetes, age, co-morbid conditions and other considerations.   This assay result is consistent with an increased risk of diabetes.   Currently, no consensus exists regarding use of hemoglobin A1c for diagnosis of diabetes in children.     06/15/2015 01:13 PM 6.1 (H) <5.7 % Final     Comment:      For someone without known diabetes, a hemoglobin A1c value between 5.7% and 6.4% is consistent with prediabetes and should be confirmed with a follow-up test.   For someone with known diabetes, a value <7% indicates that their diabetes is well controlled. A1c targets should be individualized based on duration of diabetes, age, co-morbid conditions and other considerations.   This assay result is consistent with an increased risk of diabetes.   Currently, no consensus exists regarding use of hemoglobin A1c for diagnosis  of diabetes in children.       CBG: No results for input(s): GLUCAP in the last 168 hours.  Review of Systems:   Unable as patient is intubated and sedated   Past Medical History  He,  has a past medical history of AICD (automatic cardioverter/defibrillator) present (08/2018), Arthritis, Chronic combined systolic and diastolic CHF (congestive heart failure) (HCC), Deep vein thrombosis (HCC), Diverticulitis, Lupus anticoagulant positive, Morbid obesity (HCC), Mural thrombus of heart, Nonischemic cardiomyopathy (HCC), Paroxysmal SVT (supraventricular tachycardia) (HCC), and Pulmonary embolism (HCC).   Surgical History    Past Surgical History:  Procedure Laterality Date  . A-FLUTTER ABLATION N/A 11/17/2016   Procedure: A-FLUTTER ABLATION;  Surgeon: Regan Lemming, MD;  Location: [facility] INVASIVE CV LAB;  Service: Cardiovascular;  Laterality: N/A;  . BIOPSY  12/23/2018   Procedure: BIOPSY;  Surgeon: Meridee Score Netty Starring., MD;  Location: [facility] ENDOSCOPY;  Service: Gastroenterology;;  . CARDIAC CATHETERIZATION  06/2006   Angiographically normal cors  . CARDIAC CATHETERIZATION  12/22/2011   R/LHC: normal cors, well-compensated HDs, LV dysfxn  . CARDIOVERSION N/A 10/17/2016   Procedure: CARDIOVERSION;  Surgeon: Dolores Patty, MD;  Location: [facility] ENDOSCOPY;  Service: Cardiovascular;  Laterality: N/A;  . CARDIOVERSION N/A 03/20/2017   Procedure:  CARDIOVERSION;  Surgeon: Dolores Patty, MD;  Location: [facility] ENDOSCOPY;  Service: Cardiovascular;  Laterality: N/A;  . FLEXIBLE SIGMOIDOSCOPY N/A 12/23/2018   Procedure: FLEXIBLE SIGMOIDOSCOPY;  Surgeon: Lemar Lofty., MD;  Location: [facility] ENDOSCOPY;  Service: Gastroenterology;  Laterality: N/A;  . ICD IMPLANT N/A 09/03/2018   Procedure: ICD IMPLANT;  Surgeon: Regan Lemming, MD;  Location: [facility] INVASIVE CV LAB;  Service: Cardiovascular;  Laterality: N/A;  . LEFT AND RIGHT HEART CATHETERIZATION WITH CORONARY ANGIOGRAM N/A 12/22/2011   Procedure: LEFT AND RIGHT HEART CATHETERIZATION WITH CORONARY ANGIOGRAM;  Surgeon: Dolores Patty, MD;  Location: [facility] CATH LAB;  Service: Cardiovascular;  Laterality: N/A;  . PATELLAR TENDON REPAIR     Left  . RIGHT HEART CATH N/A 10/20/2016   Procedure: RIGHT HEART CATH;  Surgeon: Dolores Patty, MD;  Location: [facility] INVASIVE CV LAB;  Service: Cardiovascular;  Laterality: N/A;  . TEE WITHOUT CARDIOVERSION N/A 03/20/2017   Procedure: TRANSESOPHAGEAL ECHOCARDIOGRAM (TEE);  Surgeon: Dolores Patty, MD;  Location: [facility] ENDOSCOPY;  Service: Cardiovascular;  Laterality: N/A;     Social History   reports that he has never smoked. He has never used smokeless tobacco. He reports that he does not drink alcohol or use drugs.   Family History   His family history includes Cancer in his mother; Clotting disorder in his mother; Diabetes in his father, sister, and another family member; Heart attack in his father; Hypertension in his mother and another family member; Obesity in his sister, sister and another family member. There is no history of Colon cancer, Stomach cancer, Esophageal cancer, Rectal cancer, or Liver cancer.   Allergies Allergies  Allergen Reactions  . Shrimp [Shellfish Allergy] Hives and Itching     Home Medications  Prior to Admission medications   Medication Sig Start Date End Date Taking? Authorizing Provider  acetaminophen  (TYLENOL) 650 MG CR tablet Take 1,300 mg by mouth every 8 (eight) hours as needed for pain.   Yes [provider]  amiodarone (PACERONE) 200 MG tablet Take 1 tablet (200 mg total) by mouth daily. 10/18/18  Yes Bensimhon, Bevelyn Buckles, MD  coconut oil OIL Apply 1 application topically as needed (dry skin).    Yes [provider]  digoxin (LANOXIN) 0.125 MG tablet TAKE 1 TABLET(0.125 MG) BY MOUTH DAILY Patient taking differently: Take 0.125 mg by mouth daily.  11/01/18  Yes Bensimhon, Shaune Pascal, MD  ELIQUIS 5 MG TABS tablet TAKE 1 TABLET(5 MG) BY MOUTH TWICE DAILY Patient taking differently: Take 5 mg by mouth 2 (two) times daily.  03/22/18  Yes Bensimhon, Shaune Pascal, MD  ENTRESTO 49-51 MG TAKE 1 TABLET BY MOUTH TWICE DAILY Patient taking differently: Take 1 tablet by mouth 2 (two) times daily.  09/15/18  Yes Bensimhon, Shaune Pascal, MD  potassium chloride SA (KLOR-CON) 20 MEQ tablet Take 2 tablets (40 mEq total) by mouth 2 (two) times daily. 10/18/18  Yes Bensimhon, Shaune Pascal, MD  spironolactone (ALDACTONE) 25 MG tablet TAKE 1 TABLET(25 MG) BY MOUTH DAILY Patient taking differently: Take 25 mg by mouth daily.  12/03/18  Yes Denita Lung, MD  torsemide (DEMADEX) 20 MG tablet TAKE 2 TABLETS BY MOUTH TWICE DAILY. STOP LASIX Patient taking differently: Take 40 mg by mouth 2 (two) times daily.  06/29/18  Yes Bensimhon, Shaune Pascal, MD     Critical care time: 60 minutes    Magdalen Spatz, MSN, AGACNP-BC [place] Pager # [phone] After 4 pm please call [phone] 12/28/2018 2:28 PM

## 2018-12-28 NOTE — Care Management (Signed)
Noted TOC consult for SNF placement.  Pt will need PT/OT consults to determine LOC needed at discharge.  MD/PA, please order PT/OT evaluations when patient able to tolerate therapies.    Reinaldo Raddle, RN, BSN  Trauma/Neuro ICU Case Manager [phone]

## 2018-12-29 ENCOUNTER — Encounter: Payer: Self-pay | Admitting: *Deleted

## 2018-12-29 LAB — PREPARE FRESH FROZEN PLASMA
Unit division: 0
Unit division: 0

## 2018-12-29 LAB — COMPREHENSIVE METABOLIC PANEL
ALT: 21 U/L (ref 0–44)
AST: 22 U/L (ref 15–41)
Albumin: 2.2 g/dL — ABNORMAL LOW (ref 3.5–5.0)
Alkaline Phosphatase: 25 U/L — ABNORMAL LOW (ref 38–126)
Anion gap: 10 (ref 5–15)
BUN: 16 mg/dL (ref 6–20)
CO2: 25 mmol/L (ref 22–32)
Calcium: 8.1 mg/dL — ABNORMAL LOW (ref 8.9–10.3)
Chloride: 109 mmol/L (ref 98–111)
Creatinine, Ser: 1.68 mg/dL — ABNORMAL HIGH (ref 0.61–1.24)
GFR calc Af Amer: 53 mL/min — ABNORMAL LOW (ref >60)
GFR calc non Af Amer: 46 mL/min — ABNORMAL LOW (ref >60)
Glucose, Bld: 123 mg/dL — ABNORMAL HIGH (ref 70–99)
Potassium: 4.2 mmol/L (ref 3.5–5.1)
Sodium: 144 mmol/L (ref 135–145)
Total Bilirubin: 1.1 mg/dL (ref 0.3–1.2)
Total Protein: 5.3 g/dL — ABNORMAL LOW (ref 6.5–8.1)

## 2018-12-29 LAB — DIFFERENTIAL
Abs Immature Granulocytes: 0.14 10*3/uL — ABNORMAL HIGH (ref 0.00–0.07)
Basophils Absolute: 0 10*3/uL (ref 0.0–0.1)
Basophils Relative: 0 %
Eosinophils Absolute: 0 10*3/uL (ref 0.0–0.5)
Eosinophils Relative: 0 %
Immature Granulocytes: 1 %
Lymphocytes Relative: 11 %
Lymphs Abs: 1.3 10*3/uL (ref 0.7–4.0)
Monocytes Absolute: 1.3 10*3/uL — ABNORMAL HIGH (ref 0.1–1.0)
Monocytes Relative: 10 %
Neutro Abs: 9.9 10*3/uL — ABNORMAL HIGH (ref 1.7–7.7)
Neutrophils Relative %: 78 %

## 2018-12-29 LAB — GLUCOSE, CAPILLARY
Glucose-Capillary: 100 mg/dL — ABNORMAL HIGH (ref 70–99)
Glucose-Capillary: 115 mg/dL — ABNORMAL HIGH (ref 70–99)
Glucose-Capillary: 120 mg/dL — ABNORMAL HIGH (ref 70–99)
Glucose-Capillary: 82 mg/dL (ref 70–99)
Glucose-Capillary: 96 mg/dL (ref 70–99)
Glucose-Capillary: 99 mg/dL (ref 70–99)

## 2018-12-29 LAB — BPAM FFP
Blood Product Expiration Date: 202012202359
Blood Product Expiration Date: 202012202359
ISSUE DATE / TIME: 202012151740
ISSUE DATE / TIME: 202012151926
Unit Type and Rh: 8400
Unit Type and Rh: 8400

## 2018-12-29 LAB — CBC
HCT: 38.4 % — ABNORMAL LOW (ref 39.0–52.0)
Hemoglobin: 12.5 g/dL — ABNORMAL LOW (ref 13.0–17.0)
MCH: 29.6 pg (ref 26.0–34.0)
MCHC: 32.6 g/dL (ref 30.0–36.0)
MCV: 91 fL (ref 80.0–100.0)
Platelets: 187 10*3/uL (ref 150–400)
RBC: 4.22 MIL/uL (ref 4.22–5.81)
RDW: 13.6 % (ref 11.5–15.5)
WBC: 12.7 10*3/uL — ABNORMAL HIGH (ref 4.0–10.5)
nRBC: 0 % (ref 0.0–0.2)

## 2018-12-29 LAB — PHOSPHORUS: Phosphorus: 2.3 mg/dL — ABNORMAL LOW (ref 2.5–4.6)

## 2018-12-29 LAB — APTT: aPTT: 29 seconds (ref 24–36)

## 2018-12-29 LAB — PREALBUMIN: Prealbumin: 10.1 mg/dL — ABNORMAL LOW (ref 18–38)

## 2018-12-29 LAB — DIGOXIN LEVEL: Digoxin Level: 0.3 ng/mL — ABNORMAL LOW (ref 0.8–2.0)

## 2018-12-29 LAB — MAGNESIUM: Magnesium: 2.2 mg/dL (ref 1.7–2.4)

## 2018-12-29 LAB — TRIGLYCERIDES: Triglycerides: 67 mg/dL (ref <150)

## 2018-12-29 MED ORDER — LACTATED RINGERS IV BOLUS
1000.0000 mL | Freq: Once | INTRAVENOUS | Status: AC
  Administered 2018-12-29: 1000 mL via INTRAVENOUS

## 2018-12-29 MED ORDER — VASOPRESSIN 20 UNIT/ML IV SOLN
0.0100 [IU]/min | INTRAVENOUS | Status: DC
  Administered 2018-12-29 – 2019-01-01 (×5): 0.04 [IU]/min via INTRAVENOUS
  Filled 2018-12-29 (×6): qty 2

## 2018-12-29 MED ORDER — NOREPINEPHRINE 4 MG/250ML-% IV SOLN
0.0000 ug/min | INTRAVENOUS | Status: DC
  Administered 2018-12-29 (×2): 10 ug/min via INTRAVENOUS
  Administered 2018-12-30: 8 ug/min via INTRAVENOUS
  Administered 2018-12-30: 10 ug/min via INTRAVENOUS
  Administered 2018-12-30: 12 ug/min via INTRAVENOUS
  Administered 2018-12-31: 06:00:00 6 ug/min via INTRAVENOUS
  Administered 2018-12-31: 4 ug/min via INTRAVENOUS
  Administered 2018-12-31: 14 ug/min via INTRAVENOUS
  Administered 2019-01-01: 21:00:00 3 ug/min via INTRAVENOUS
  Filled 2018-12-29 (×7): qty 250

## 2018-12-29 MED ORDER — MORPHINE SULFATE (PF) 2 MG/ML IV SOLN
2.0000 mg | INTRAVENOUS | Status: DC | PRN
  Administered 2018-12-29 – 2018-12-30 (×2): 2 mg via INTRAVENOUS
  Administered 2018-12-30: 4 mg via INTRAVENOUS
  Administered 2018-12-30: 2 mg via INTRAVENOUS
  Administered 2018-12-30 – 2019-01-03 (×19): 4 mg via INTRAVENOUS
  Administered 2019-01-03: 2 mg via INTRAVENOUS
  Administered 2019-01-03 (×5): 4 mg via INTRAVENOUS
  Administered 2019-01-04: 2 mg via INTRAVENOUS
  Administered 2019-01-04 – 2019-01-05 (×4): 4 mg via INTRAVENOUS
  Filled 2018-12-29 (×4): qty 2
  Filled 2018-12-29 (×2): qty 1
  Filled 2018-12-29 (×8): qty 2
  Filled 2018-12-29: qty 1
  Filled 2018-12-29 (×3): qty 2
  Filled 2018-12-29 (×2): qty 1
  Filled 2018-12-29 (×3): qty 2
  Filled 2018-12-29: qty 1
  Filled 2018-12-29 (×4): qty 2
  Filled 2018-12-29: qty 1
  Filled 2018-12-29 (×6): qty 2

## 2018-12-29 MED ORDER — TRACE MINERALS CU-MN-SE-ZN 300-55-60-3000 MCG/ML IV SOLN
INTRAVENOUS | Status: AC
  Filled 2018-12-29: qty 544

## 2018-12-29 MED ORDER — SODIUM PHOSPHATES 45 MMOLE/15ML IV SOLN
10.0000 mmol | Freq: Once | INTRAVENOUS | Status: AC
  Administered 2018-12-29: 10 mmol via INTRAVENOUS
  Filled 2018-12-29: qty 3.33

## 2018-12-29 NOTE — Progress Notes (Signed)
NAME:  Roberto [name], MRN:  [id number], DOB:  July 13, 1965, LOS: 9 ADMISSION DATE:  12/19/2018, CONSULTATION DATE:  12/28/2018 REFERRING MD: Hollice Gong, CHIEF COMPLAINT: Left colonic stricture of unknown etiology, perforated cecum   Information obtained  from medical record as patient is intubated and sedated  Brief History   Pt. With history of NICM, EF 25-30%, PSVT, PAF, chronic systolic HF with AICD, hx of DVT/PE/LV thrombus , on chronic anticoagulation ( bridged with heparin)  , was   admitted 12/20/2018 for acute diverticulitis. He was initially treated conservatively, but had no real improvement and was taken to the OR 12/28/2018.Pt was found to have a perforated cecum with leakage of stool throughout abdominal cavity. He was brought from the OR to NICU intubated and on pressors. PCCM have been asked to admit and manage care.  History of present illness   Pt. With history of NICM, EF 25-30%, PSVT, PAF, chronic systolic HF with AICD, hx of DVT/PE/LV thrombus , on chronic anticoagulation ( bridged with heparin)  , was   admitted 12/20/2018 for acute diverticulitis. He was initially treated conservatively, but had no real improvement and was taken to the OR 12/28/2018. Pt had Left colonic stricture of unknown etiology, and perforated cecum. Per surgery's note there was a mass in the mid descending colon. The  right colon  was ischemic and had perforated. There was visualization of leakage of a large amount of stool throughout his entire abdominal cavity.  In OR patient became hypotensive , required pressors  (Phenylephrine and Levo).  Surgery felt the patient was not ready for extubation , and considering co morbidities and possible septic insult   asked PCCM to admit to ICU manage care .  Past Medical History   Past Medical History:  Diagnosis Date  . AICD (automatic cardioverter/defibrillator) present 08/2018  . Arthritis   . Chronic combined systolic and diastolic CHF (congestive heart failure)  ([place])   . Deep vein thrombosis ([place])   . Diverticulitis   . Lupus anticoagulant positive   . Morbid obesity ([place])   . Mural thrombus of heart    coumadin  . Nonischemic cardiomyopathy ([place])    a) 12/17/11 echo: LVEF 81-82%, grade 3 diastolic dysfunction (c/w restriction), mod MR, mod LA/LV and mild RA dilatation; b) 12/18/11 cMRI: LVEF 38%, mod LV/mild RV dilatation, global HK, mild-mod RV sys dysfxn, no LV thrombus & patchy non-subendocardial delayed enhancement c/w infil dz or prior myocarditis; c. 07/2018 EF 25-30%.  . Paroxysmal SVT (supraventricular tachycardia) ([place])   . Pulmonary embolism ([place])    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    [facility] [facility] Events   12/20/2018 Admission 12/28/2018 To OR >> perforated cecum with stool contamination to abdomen  Consults:  Surgery GI Cards  Procedures:  12/28/2018 Exploratory laparotomy Subtotal colectomy End ileostomy  Significant Diagnostic Tests:  12/14 CT Abdomen Pelvis Worsening distal large-bowel obstruction at the level of the descending colon correlating with an approximately 5 cm in length segment of irregular annular wall thickening with underlying colonic diverticulosis and mild pericolonic fat stranding. Imaging findings remain indeterminate for malignant versus diverticular descending colonic stricture. New diffuse small bowel dilatation with air-fluid levels. No free air or abscess. Chronic findings include: Mild cardiomegaly. Small hiatal hernia. Posterior liver masses, decreased in size since 2010, compatible with benign lesions.  12/27/2018 KUB Nasogastric tube tip and side port in stomach. Persistent bowel dilatation. No free air demonstrable  12/23/2018  Biopsy >> Colon COLON, DESCENDING, BIOPSY:  -  Benign colonic mucosa with mild reactive changes.  - No active inflammation.  - No dysplasia or malignancy  Micro Data:  12/20/2018  SARS Coronavirus 2 NEGATIVE NEGATIVE   12/20/2018 MRSA, PCR  NEGATIVE NEGATIVE  NEGATIVE   Staphylococcus aureus NEGATIVE NEGATIVE  NEGATIVE    Antimicrobials:  Zosyn 12/20/2018>> Diflucan 12/15>> Interim history/subjective:  O2 requirements improved Remains in septic shock  Objective   Blood pressure (!) 102/52, pulse 72, temperature (!) 103.9 F (39.9 C), temperature source Axillary, resp. rate 18, height 5\' 7"  (1.702 m), weight (!) 142.7 kg, SpO2 99 %.    Vent Mode: PRVC FiO2 (%):  [40 %-70 %] 40 % Set Rate:  [18 bmp] 18 bmp Vt Set:  [520 mL] 520 mL PEEP:  [5 cmH20] 5 cmH20 Plateau Pressure:  [16 cmH20-18 cmH20] 18 cmH20   Intake/Output Summary (Last 24 hours) at 12/29/2018 1024 Last data filed at 12/29/2018 0800 Gross per 24 hour  Intake 6809.03 ml  Output 3035 ml  Net 3774.03 ml   Filed Weights   12/28/18 0541 12/28/18 1245 12/29/18 0500  Weight: (!) 146.3 kg (!) 141.1 kg (!) 142.7 kg    Examination: GEN: obese man on vent HEENT: ETT in place, minimal secretions CV: RRR, ext warm PULM: Clear without accessory muscle use GI: soft, incision line dressed, ostomy looks okay EXT: no edema NEURO: moves all 4 ext to command PSYCH: RASS -1 SKIN: No rashes   Resolved Hospital Problem list     Assessment & Plan:  # Septic shock 2/2 perforated cecum and contamination of abdominal cavity with stool s/p subtotal colectomy and end ileostomy 12/15 # NICM, baseline EF 25-30% ( 07/2018 Echo) # Colon mass  - Continue zosyn, fluconazole - Another liter bolus - Wean phenylephrine, levophed + vasopressin - Continue amiodarone gtt - Should be okay for extubation trial - Rectal tylenol - Wound care, drain management per surgery - f/u path from colon mass 12/15 - Needs to start heparin tomorrow  Best practice:  Diet: NPO/ TNA Pain/Anxiety/Delirium protocol (if indicated): Fentanyl/ Precedex - wean VAP protocol (if indicated): NA DVT prophylaxis: PAS hose, no heparin  GI prophylaxis: Protonix  Glucose control: CBG /   SSI Mobility: BR Code Status: FC Family Communication: Updated patient Disposition: ICU

## 2018-12-29 NOTE — Progress Notes (Signed)
1 Day Post-Op   Subjective/Chief Complaint: Intbuated, on pressors   Objective: Vital signs in last 24 hours: Temp:  [99.6 F (37.6 C)-103.9 F (39.9 C)] 103.9 F (39.9 C) (12/16 0800) Pulse Rate:  [65-108] 72 (12/16 0900) Resp:  [18-26] 18 (12/16 0900) BP: (78-147)/(45-84) 102/52 (12/16 0900) SpO2:  [97 %-100 %] 99 % (12/16 0900) Arterial Line BP: (76-150)/(50-78) 149/57 (12/16 0900) FiO2 (%):  [40 %-70 %] 40 % (12/16 0833) Weight:  [141.1 kg-142.7 kg] 142.7 kg (12/16 0500) Last BM Date: 12/25/18  Intake/Output from previous day: 12/15 0701 - 12/16 0700 In: 6654.1 [I.V.:3675.8; Blood:875.7; IV Piggyback:2102.7] Out: 3185 [Urine:1525; Emesis/NG output:60; Drains:580; Stool:20; Blood:400] Intake/Output this shift: Total I/O In: 154.9 [I.V.:133.6; IV Piggyback:21.3] Out: -   GI: ileostomy viable, no bs, drain serous, wound open and clean  Lab Results:  Recent Labs    12/28/18 1410 12/28/18 1425 12/29/18 0500  WBC 10.4  --  12.7*  HGB 13.9 13.3 12.5*  HCT 44.3 39.0 38.4*  PLT 235  --  187   BMET Recent Labs    12/28/18 1410 12/28/18 1425 12/29/18 0500  NA 143 143 144  K 3.8 3.5 4.2  CL 105  --  109  CO2 22  --  25  GLUCOSE 130*  --  123*  BUN 19  --  16  CREATININE 1.93*  --  1.68*  CALCIUM 7.9*  --  8.1*   PT/INR Recent Labs    12/28/18 1410  LABPROT 18.2*  INR 1.5*   ABG Recent Labs    12/28/18 1117 12/28/18 1425  PHART 7.374 7.367  HCO3 28.1* 21.8    Studies/Results: DG Abd 1 View  Result Date: 12/27/2018 CLINICAL DATA:  Nasogastric tube placement EXAM: ABDOMEN - 1 VIEW COMPARISON:  December 22, 2018 FLUOROSCOPY TIME:  1 minutes 0 seconds period 1 acquired image FINDINGS: Nasogastric tube tip and side port in stomach. There are loops of dilated bowel. No free air evident. IMPRESSION: Nasogastric tube tip and side port in stomach. Persistent bowel dilatation. No free air demonstrable. Electronically Signed   By: Lowella Grip III M.D.    On: 12/27/2018 16:31   DG CHEST PORT 1 VIEW  Result Date: 12/28/2018 CLINICAL DATA:  Respiratory failure. EXAM: PORTABLE CHEST 1 VIEW COMPARISON:  September 04, 2018. FINDINGS: Stable cardiomediastinal silhouette. Endotracheal and nasogastric tubes appear to be in grossly good position. Stable left-sided pacemaker is noted. Mild central pulmonary vascular congestion is noted. No pneumothorax or pleural effusion is noted. Hypoinflation of the lungs is noted with mild bibasilar subsegmental atelectasis. Bony thorax is unremarkable. IMPRESSION: Stable support apparatus. Hypoinflation of the lungs with mild bibasilar subsegmental atelectasis. Stable mild central pulmonary vascular congestion is noted. Electronically Signed   By: Marijo Conception M.D.   On: 12/28/2018 14:25   [place] EKG SITE RITE  Result Date: 12/28/2018 If Site Rite image not attached, placement could not be confirmed due to current cardiac rhythm.   Anti-infectives: Anti-infectives (From admission, onward)   Start     Dose/Rate Route Frequency Ordered Stop   12/28/18 1400  fluconazole (DIFLUCAN) IVPB 400 mg     400 mg 100 mL/hr over 120 Minutes Intravenous Every 24 hours 12/28/18 1318     12/20/18 2200  piperacillin-tazobactam (ZOSYN) IVPB 3.375 g     3.375 g 12.5 mL/hr over 240 Minutes Intravenous Every 8 hours 12/20/18 1834     12/20/18 0645  piperacillin-tazobactam (ZOSYN) IVPB 3.375 g  Status:  Discontinued  3.375 g 100 mL/hr over 30 Minutes Intravenous Every 6 hours 12/20/18 0630 12/20/18 1833   12/19/18 2315  piperacillin-tazobactam (ZOSYN) IVPB 3.375 g     3.375 g 100 mL/hr over 30 Minutes Intravenous  Once 12/19/18 2302 12/20/18 0003      Assessment/Plan: POD 1 STC/ileostomy for left colon mass (path pending) and perforated right colon- Roberto [name] -TPN, hold tube feeds for now -path pending -bid dressing changes -woc consult -do not restart heparin today, will restart tomorrow due to bleeding risk and  surgery  CHF EF 25-30% Atrial fibrillation Hx DVT/PE LV mural thrombus Gross anticoagulant Morbid obesityBMI 50.63 CKDcreatinine 1.81>>1.44>>1.29>>1.43>>1.54 Dehydration    Roberto [name] 12/29/2018

## 2018-12-29 NOTE — Progress Notes (Signed)
PHARMACY - TOTAL PARENTERAL NUTRITION CONSULT NOTE   Indication: massive bowel resection, prolonged NPO  Patient Measurements: Height: 5\' 7"  (170.2 cm) Weight: (!) 314 lb 9.5 oz (142.7 kg) IBW/kg (Calculated) : 66.1 TPN AdjBW (KG): 84.8 Body mass index is 49.27 kg/m.  Assessment:  69 yom presented to the hospital with abdominal pain found to be acute diverticulitis. He was treated conservatively but without improvement. He was taken to the OR 12/15 and found to have a perforated cecum with leakage of stool throughout the abdominal cavity. To start TPN for nutrition support today. He remains on the ventilator with vasopressors. Patient has been NPO or clear liquids since admission so he may be at risk for refeeding syndrome.   Glucose / Insulin: A1c 6.3, has only required 2 units SSI since starting last night Electrolytes: WNL except low phos of 2.3 and iCa 1.07 yesterday (repleted) Renal: SCr 1.68, UOP 0.4 mL/kg/hr LFTs / TGs: LFTs WNL, TG 67 Prealbumin / albumin: prealbumin 10.1, albumin 2.2 Intake / Output; MIVF: Drain O/P 580, NG O/P 60, +1.4L GI Imaging: 12/14 Abd xray: Persistent bowel dilatation. No free air demonstrable 12/14 CT abd: Worsening distal large-bowel obstruction at the level of the descending colon correlating with an approximately 5 cm in length segment of irregular annular wall thickening with underlying colonic diverticulosis and mild pericolonic fat stranding 12/9 Abd xray: Worsening gaseous distension of the proximal colon and now involving much of the small bowel Surgeries / Procedures:  12/15 Exlap, subtotal colectomy and end ileostomy 12/10 Flexible sigmoidoscopy  Central access: PICC 12/15 TPN start date: 12/16  Nutritional Goals (per RD recommendation on 12/15): kCal: 1608-1800, Protein: 165-175, Fluid: >/=1.8 L/day Goal TPN rate is 80 mL/hr (provides 163 g of protein and 1958 kcals per day)  Current Nutrition:  NPO  Plan:  Start TPN at 49mL/hr at  1800 - provides 82g protein and 979kcal (~50% of goal) Hold lipid emulsion for first 7 days for critically ill patients per ASPEN guidelines (Start date 12/23) Electrolytes in TPN: 39mEq/L of Na, 22mEq/L of K, 29mEq/L of Ca, 52mEq/L of Mg, and 86mmol/L of Phos. Cl:Ac ratio 1:1 Add standard MVI MWF and trace elements daily to TPN Supplement sodium phos 10 mmol IV x 1 Continue Moderate q4h SSI and adjust as needed  Monitor TPN labs   Roberto [name], Roberto [name] 12/29/2018,7:44 AM

## 2018-12-29 NOTE — Consult Note (Signed)
Leslie Nurse ostomy follow up Patient receiving care in [place].  Accompanied for pouch change with his primary RN, Estill Bamberg. Stoma type/location: RLQ ileostomy Stomal assessment/size: 1 3/8 inches, red, moist producing liquid brown Peristomal assessment: intact Treatment options for stomal/peristomal skin: barrier ring Output: pouch approximately 1/2 of liquid brown stool Ostomy pouching: 1pc. Flat cut to fit with barrier ring Education provided: none. Patient intuabed Enrolled patient in [place] Start Discharge program: No I reviewed the orders for supplies I placed on 12/28/18 with the current primary RN.  She will order the correct supplies. Val Riles, RN, MSN, CWOCN, CNS-BC, pager [phone]

## 2018-12-29 NOTE — Procedures (Signed)
Extubation Procedure Note  Patient Details:   Name: Roberto [name] DOB: Jan 18, 1965 MRN: [id number]   Airway Documentation:    Vent end date: 12/29/18 Vent end time: 1051   Evaluation  O2 sats: stable throughout Complications: No apparent complications Patient did tolerate procedure well. Bilateral Breath Sounds: Clear, Diminished   Yes  4l.min [place] placed Incentive spirometer instructed,775ml  Revonda Standard 12/29/2018, 10:52 AM

## 2018-12-30 LAB — COMPREHENSIVE METABOLIC PANEL
ALT: 18 U/L (ref 0–44)
AST: 25 U/L (ref 15–41)
Albumin: 1.9 g/dL — ABNORMAL LOW (ref 3.5–5.0)
Alkaline Phosphatase: 27 U/L — ABNORMAL LOW (ref 38–126)
Anion gap: 10 (ref 5–15)
BUN: 13 mg/dL (ref 6–20)
CO2: 25 mmol/L (ref 22–32)
Calcium: 8 mg/dL — ABNORMAL LOW (ref 8.9–10.3)
Chloride: 110 mmol/L (ref 98–111)
Creatinine, Ser: 1.35 mg/dL — ABNORMAL HIGH (ref 0.61–1.24)
GFR calc Af Amer: >60 mL/min (ref >60)
GFR calc non Af Amer: 60 mL/min — ABNORMAL LOW (ref >60)
Glucose, Bld: 144 mg/dL — ABNORMAL HIGH (ref 70–99)
Potassium: 3.8 mmol/L (ref 3.5–5.1)
Sodium: 145 mmol/L (ref 135–145)
Total Bilirubin: 1 mg/dL (ref 0.3–1.2)
Total Protein: 5.2 g/dL — ABNORMAL LOW (ref 6.5–8.1)

## 2018-12-30 LAB — GLUCOSE, CAPILLARY
Glucose-Capillary: 121 mg/dL — ABNORMAL HIGH (ref 70–99)
Glucose-Capillary: 121 mg/dL — ABNORMAL HIGH (ref 70–99)
Glucose-Capillary: 155 mg/dL — ABNORMAL HIGH (ref 70–99)
Glucose-Capillary: 162 mg/dL — ABNORMAL HIGH (ref 70–99)
Glucose-Capillary: 132 mg/dL — ABNORMAL HIGH (ref 70–99)
Glucose-Capillary: 146 mg/dL — ABNORMAL HIGH (ref 70–99)

## 2018-12-30 LAB — CBC
HCT: 32.9 % — ABNORMAL LOW (ref 39.0–52.0)
HCT: 33.5 % — ABNORMAL LOW (ref 39.0–52.0)
Hemoglobin: 10 g/dL — ABNORMAL LOW (ref 13.0–17.0)
Hemoglobin: 10.4 g/dL — ABNORMAL LOW (ref 13.0–17.0)
MCH: 29.1 pg (ref 26.0–34.0)
MCH: 29.3 pg (ref 26.0–34.0)
MCHC: 30.4 g/dL (ref 30.0–36.0)
MCHC: 31 g/dL (ref 30.0–36.0)
MCV: 95.6 fL (ref 80.0–100.0)
MCV: 94.4 fL (ref 80.0–100.0)
Platelets: 109 10*3/uL — ABNORMAL LOW (ref 150–400)
Platelets: 125 10*3/uL — ABNORMAL LOW (ref 150–400)
RBC: 3.44 MIL/uL — ABNORMAL LOW (ref 4.22–5.81)
RBC: 3.55 MIL/uL — ABNORMAL LOW (ref 4.22–5.81)
RDW: 14.2 % (ref 11.5–15.5)
RDW: 14.2 % (ref 11.5–15.5)
WBC: 13.6 10*3/uL — ABNORMAL HIGH (ref 4.0–10.5)
WBC: 17 10*3/uL — ABNORMAL HIGH (ref 4.0–10.5)
nRBC: 0 % (ref 0.0–0.2)
nRBC: 0 % (ref 0.0–0.2)

## 2018-12-30 LAB — PHOSPHORUS: Phosphorus: 1.7 mg/dL — ABNORMAL LOW (ref 2.5–4.6)

## 2018-12-30 LAB — DIGOXIN LEVEL: Digoxin Level: <0.2 ng/mL — ABNORMAL LOW (ref 0.8–2.0)

## 2018-12-30 LAB — APTT
aPTT: 32 seconds (ref 24–36)
aPTT: 78 seconds — ABNORMAL HIGH (ref 24–36)

## 2018-12-30 LAB — HEPARIN LEVEL (UNFRACTIONATED): Heparin Unfractionated: 0.36 IU/mL (ref 0.30–0.70)

## 2018-12-30 LAB — MAGNESIUM: Magnesium: 2.1 mg/dL (ref 1.7–2.4)

## 2018-12-30 MED ORDER — LACTATED RINGERS IV BOLUS
500.0000 mL | Freq: Once | INTRAVENOUS | Status: AC
  Administered 2018-12-30: 500 mL via INTRAVENOUS

## 2018-12-30 MED ORDER — TRACE MINERALS CU-MN-SE-ZN 300-55-60-3000 MCG/ML IV SOLN
INTRAVENOUS | Status: AC
  Filled 2018-12-30: qty 816

## 2018-12-30 MED ORDER — POTASSIUM PHOSPHATES 15 MMOLE/5ML IV SOLN
20.0000 mmol | Freq: Once | INTRAVENOUS | Status: AC
  Administered 2018-12-30: 20 mmol via INTRAVENOUS
  Filled 2018-12-30: qty 6.67

## 2018-12-30 MED ORDER — LACTATED RINGERS IV BOLUS
500.0000 mL | Freq: Once | INTRAVENOUS | Status: AC
  Administered 2018-12-30: 18:00:00 500 mL via INTRAVENOUS

## 2018-12-30 MED ORDER — HYDROMORPHONE HCL 1 MG/ML IJ SOLN
2.0000 mg | Freq: Once | INTRAMUSCULAR | Status: AC
  Administered 2018-12-30: 2 mg via INTRAVENOUS
  Filled 2018-12-30: qty 2

## 2018-12-30 MED ORDER — LACTATED RINGERS IV BOLUS
1000.0000 mL | Freq: Once | INTRAVENOUS | Status: AC
  Administered 2018-12-30: 20:00:00 1000 mL via INTRAVENOUS

## 2018-12-30 MED ORDER — HEPARIN (PORCINE) 25000 UT/250ML-% IV SOLN
3100.0000 [IU]/h | INTRAVENOUS | Status: DC
  Administered 2018-12-30 – 2019-01-01 (×6): 2300 [IU]/h via INTRAVENOUS
  Administered 2019-01-02 (×2): 2400 [IU]/h via INTRAVENOUS
  Administered 2019-01-03: 17:00:00 2750 [IU]/h via INTRAVENOUS
  Administered 2019-01-03: 06:00:00 2400 [IU]/h via INTRAVENOUS
  Administered 2019-01-04 (×2): 3000 [IU]/h via INTRAVENOUS
  Administered 2019-01-04: 2750 [IU]/h via INTRAVENOUS
  Administered 2019-01-05 – 2019-01-07 (×8): 3200 [IU]/h via INTRAVENOUS
  Administered 2019-01-08 (×3): 3300 [IU]/h via INTRAVENOUS
  Administered 2019-01-09: 06:00:00 3100 [IU]/h via INTRAVENOUS
  Filled 2018-12-30 (×26): qty 250

## 2018-12-30 NOTE — Progress Notes (Signed)
ANTICOAGULATION CONSULT NOTE - Follow Up Consult  Pharmacy Consult for Heparin Indication: hx of DVT/PE, afib  Allergies  Allergen Reactions  . Shrimp [Shellfish Allergy] Hives and Itching    Patient Measurements: Height: 5\' 7"  (170.2 cm) Weight: (!) 324 lb 8.3 oz (147.2 kg) IBW/kg (Calculated) : 66.1 Heparin Dosing Weight: 105.2 kg  Vital Signs: Temp: 98.5 F (36.9 C) (12/17 1530) Temp Source: Oral (12/17 1530) BP: 101/67 (12/17 0815) Pulse Rate: 98 (12/17 0830)  Labs: Recent Labs    12/28/18 0447 12/28/18 1410 12/28/18 1425 12/29/18 0500 12/30/18 0447 12/30/18 0558 12/30/18 1639  HGB 14.1 13.9 13.3 12.5* 10.0*  --   --   HCT 44.3 44.3 39.0 38.4* 32.9*  --   --   PLT 219 235  --  187 109*  --   --   APTT 106*  --   --  29 32  --  78*  LABPROT  --  18.2*  --   --   --   --   --   INR  --  1.5*  --   --   --   --   --   HEPARINUNFRC 1.52*  --   --   --   --   --  0.36  CREATININE  --  1.93*  --  1.68*  --  1.35*  --     Estimated Creatinine Clearance: 88.2 mL/min (A) (by C-G formula based on SCr of 1.35 mg/dL (H)).   Assessment: 35 yoM admitted for acute diverticulitis s/p subtotal colectomy 12/15. Heparin was resumed this AM (12/17) per pharmacy dosing consult. Pt was on apixaban 5 mg BID PTA (last dose 12/19/18) for hx of DVT/PE/LV thrombus.  Initial aPTT after restart is 78 - therapeutic for goal. Heparin level is also at goal at 0.36 -appear to be correlating. Will follow heparin levels going forward. H/H and platelets trended down this AM.  Pressor requirements are decreasing. This PM, RN states that there is some minor bleeding from IV site from 4PM to 5PM where IV Heparin was infusion. RN switched Heparin to a different site and bleeding resolved. No other overt signs of bleeding noted. Will continue at current rate for now but requested that RN call if any new bleeding signs or symptoms.   Goal of Therapy:  Heparin level 0.3-0.7 units/ml aPTT 66-102  seconds Monitor platelets by anticoagulation protocol: Yes   Plan:  Continue Heparin at 2300 units/hr.  Daily heparin level and CBC  RN to call pharmacy if any new bleeding signs or symptoms.   Sloan Leiter, PharmD, BCPS, BCCCP Clinical Pharmacist Please refer to [facility] for [place] numbers 12/30/2018 5:42 PM

## 2018-12-30 NOTE — Progress Notes (Signed)
Nutrition Follow-up   RD working remotely.  DOCUMENTATION CODES:   Morbid obesity  INTERVENTION:  TPN per Pharmacy.   RD to continue to monitor.   NUTRITION DIAGNOSIS:   Inadequate oral intake related to inability to eat as evidenced by NPO status. 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 He required vasopressors intraoperatively and was brought to the ICU for continued vent management and septic shock due to intrabdominal sepsis.   Procedure (12/15): Exploratory laparotomy Subtotal colectomy End ileostomy 12/16 extubated; TPN starated; plan to continue NG until 12/18 then can possibly adv diet  Patient is currently intubated on ventilator support MV: 11.9 L/min Temp (24hrs), Avg:100.9 F (38.3 C), Min:98.7 F (37.1 C), Max:103 F (39.4 C)   Per MD, pt with left colonic stricture of unknown etiology, and perforated cecum. Per surgery, pt with mass in the mid descending colon, right colon ischemic and had perforated with  leakage of large amount of stool throughout entire abdominal cavity. TPN has been ordered. Per Pharmacy, TPN to start tomorrow. Noted pt has been on NPO/clear liquid diet since admission (8 days).   Labs and medications reviewed.  Levo @ 9 mcg Vasopressin @ .04 units  KPhos x 1 PO4: 1.7 (L)  14 F NG: 175 ml JP: 210 ml  TPN @ 60 provides 1469 kcal, 122 grams protein  Diet Order:   Diet Order            Diet NPO time specified Except for: Ice Chips  Diet effective now               EDUCATION NEEDS:   Not appropriate for education at this time  Skin:  Skin Assessment: Skin Integrity Issues: Skin Integrity Issues:: Incisions Incisions: abdomen  Last BM:  350 ml via ileostomy  Height:   Ht Readings from Last 1 Encounters:  12/28/18 5\' 7"  (1.702 m)    Weight:   Wt Readings from Last 1 Encounters:  12/30/18 (!) 147.2 kg    Ideal Body Weight:  67.27 kg  BMI:  Body mass index is 50.83 kg/m.  Estimated Nutritional Needs:   Kcal:  2200-2400  Protein:  130-150 grams  Fluid:  >/= 1.8 L/day   Maylon Peppers RD, LDN, CNSC [phone] Pager [phone] After Hours Pager

## 2018-12-30 NOTE — Progress Notes (Signed)
ANTICOAGULATION CONSULT NOTE - Follow Up Consult  Pharmacy Consult for Heparin Indication: hx of DVT/PE, afib  Allergies  Allergen Reactions  . Shrimp [Shellfish Allergy] Hives and Itching    Patient Measurements: Height: 5\' 7"  (170.2 cm) Weight: (!) 324 lb 8.3 oz (147.2 kg) IBW/kg (Calculated) : 66.1 Heparin Dosing Weight: 105.2 kg  Vital Signs: Temp: 100.7 F (38.2 C) (12/17 0400) Temp Source: Axillary (12/17 0400) BP: 122/77 (12/17 0600) Pulse Rate: 99 (12/17 0600)  Labs: Recent Labs    12/28/18 0447 12/28/18 1410 12/28/18 1425 12/29/18 0500 12/30/18 0447  HGB 14.1 13.9 13.3 12.5* 10.0*  HCT 44.3 44.3 39.0 38.4* 32.9*  PLT 219 235  --  187 109*  APTT 106*  --   --  29 32  LABPROT  --  18.2*  --   --   --   INR  --  1.5*  --   --   --   HEPARINUNFRC 1.52*  --   --   --   --   CREATININE  --  1.93*  --  1.68*  --     Estimated Creatinine Clearance: 70.8 mL/min (A) (by C-G formula based on SCr of 1.68 mg/dL (H)).   Assessment: 1 yoM admitted for acute diverticulitis s/p subtotal colectomy 12/15. Heparin to restart today. Pt was on apixaban 5 mg BID PTA for hx of DVT/PE/LV thrombus. No overt bleeding noted but platelets and hemoglobin are down. Will need to monitor closely.   Goal of Therapy:  Heparin level 0.3-0.7 units/ml aPTT 66-102 seconds Monitor platelets by anticoagulation protocol: Yes   Plan:  Restart heparin gtt at 2300 units/hr Check an 8 hr heparin level and aPTT Daily heparin level, aPTT and CBC  Salome Arnt, PharmD, BCPS Clinical Pharmacist Please see AMION for all pharmacy numbers 12/30/2018 8:00 AM

## 2018-12-30 NOTE — Progress Notes (Addendum)
Progress Note  Patient Name: Roberto [name] Date of Encounter: 12/30/2018  Primary Cardiologist: Glori Bickers, MD   Subjective   Patient extubated yesterday. Not on supplemental O2. He says he feels bloated in his abdomen with some mild chest tightness. Appears to be in NSR with frequent PVCs  Inpatient Medications    Scheduled Meds: . sodium chloride   Intravenous Once  . chlorhexidine gluconate (MEDLINE KIT)  15 mL Mouth Rinse BID  . Chlorhexidine Gluconate Cloth  6 each Topical Daily  . digoxin  0.125 mg Intravenous Daily  . insulin aspart  0-15 Units Subcutaneous Q4H  . mouth rinse  15 mL Mouth Rinse 10 times per day  . pantoprazole (PROTONIX) IV  40 mg Intravenous Q24H  . sodium chloride flush  10-40 mL Intracatheter Q12H   Continuous Infusions: . sodium chloride Stopped (12/20/18 1319)  . sodium chloride    . fluconazole (DIFLUCAN) IV Stopped (12/29/18 1610)  . heparin    . norepinephrine (LEVOPHED) Adult infusion 10 mcg/min (12/30/18 0600)  . piperacillin-tazobactam (ZOSYN)  IV 12.5 mL/hr at 12/30/18 0600  . TPN ADULT (ION) 40 mL/hr at 12/30/18 0600  . vasopressin (PITRESSIN) infusion - *FOR SHOCK* 0.04 Units/min (12/30/18 0600)   PRN Meds: sodium chloride, acetaminophen, morphine injection, sodium chloride flush   Vital Signs    Vitals:   12/30/18 0300 12/30/18 0400 12/30/18 0500 12/30/18 0600  BP: (!) 123/44 (!) 108/56 103/64 122/77  Pulse: 96 91 95 99  Resp: (!) 25 (!) 21 (!) 24 (!) 31  Temp:  (!) 100.7 F (38.2 C)    TempSrc:  Axillary    SpO2: 96% 97% 97% 99%  Weight:   (!) 147.2 kg   Height:        Intake/Output Summary (Last 24 hours) at 12/30/2018 0804 Last data filed at 12/30/2018 0600 Gross per 24 hour  Intake 3477.22 ml  Output 2490 ml  Net 987.22 ml   Last 3 Weights 12/30/2018 12/29/2018 12/28/2018  Weight (lbs) 324 lb 8.3 oz 314 lb 9.5 oz 311 lb 1.1 oz  Weight (kg) 147.2 kg 142.7 kg 141.1 kg      Telemetry    NSR, HR 90-105;  frequent PVCs - Personally Reviewed  ECG    Pending - Personally Reviewed  Physical Exam   GEN: No acute distress.   Neck: No JVD Cardiac: RRR, no murmurs, rubs, or gallops.  Respiratory: Clear to auscultation bilaterally. GI: +tender, distended; NG tube MS: No edema; No deformity. Neuro:  Nonfocal  Psych: Normal affect   Labs    High Sensitivity Troponin:  No results for input(s): TROPONINIHS in the last 720 hours.    Chemistry Recent Labs  Lab 12/27/18 0324 12/28/18 1410 12/28/18 1425 12/29/18 0500  NA 139 143 143 144  K 3.5 3.8 3.5 4.2  CL 98 105  --  109  CO2 26 22  --  25  GLUCOSE 105* 130*  --  123*  BUN 17 19  --  16  CREATININE 1.54* 1.93*  --  1.68*  CALCIUM 8.4* 7.9*  --  8.1*  PROT  --  5.4*  --  5.3*  ALBUMIN  --  2.4*  --  2.2*  AST  --  24  --  22  ALT  --  24  --  21  ALKPHOS  --  26*  --  25*  BILITOT  --  1.7*  --  1.1  GFRNONAA 51* 39*  --  [place] 16*  --  10     Hematology Recent Labs  Lab 12/28/18 1410 12/28/18 1425 12/29/18 0500 12/30/18 0447  WBC 10.4  --  12.7* 13.6*  RBC 4.80  --  4.22 3.44*  HGB 13.9 13.3 12.5* 10.0*  HCT 44.3 39.0 38.4* 32.9*  MCV 92.3  --  91.0 95.6  MCH 29.0  --  29.6 29.1  MCHC 31.4  --  32.6 30.4  RDW 13.7  --  13.6 14.2  PLT 235  --  187 109*    BNPNo results for input(s): BNP, PROBNP in the last 168 hours.   DDimer No results for input(s): DDIMER in the last 168 hours.   Radiology    DG CHEST PORT 1 VIEW  Result Date: 12/28/2018 CLINICAL DATA:  Respiratory failure. EXAM: PORTABLE CHEST 1 VIEW COMPARISON:  September 04, 2018. FINDINGS: Stable cardiomediastinal silhouette. Endotracheal and nasogastric tubes appear to be in grossly good position. Stable left-sided pacemaker is noted. Mild central pulmonary vascular congestion is noted. No pneumothorax or pleural effusion is noted. Hypoinflation of the lungs is noted with mild bibasilar subsegmental atelectasis. Bony  thorax is unremarkable. IMPRESSION: Stable support apparatus. Hypoinflation of the lungs with mild bibasilar subsegmental atelectasis. Stable mild central pulmonary vascular congestion is noted. Electronically Signed   By: Marijo Conception M.D.   On: 12/28/2018 14:25   [place] EKG SITE RITE  Result Date: 12/28/2018 If Site Rite image not attached, placement could not be confirmed due to current cardiac rhythm.   Cardiac Studies   Echocardiogram 07/20/2018 IMPRESSIONS  1. The left ventricle has severely reduced systolic function, with an ejection fraction of 25-30%. The cavity size was moderately dilated. Left ventricular diastolic Doppler parameters are consistent with pseudonormalization. Left ventricular diffuse  hypokinesis. 2. The right ventricle has normal systolic function. The cavity was normal. There is no increase in right ventricular wall thickness. 3. Left atrial size was mildly dilated. 4. No evidence of mitral valve stenosis. Trivial mitral regurgitation. 5. The aortic valve is tricuspid. No stenosis of the aortic valve. 6. The aortic root is normal in size and structure. 7. The inferior vena cava was dilated in size with <50% respiratory variability. No complete TR doppler jet so unable to estimate PA systolic pressure.  Patient Profile     53 y.o. male with a hx of chronic diastolic HF,h/o ofLv thrombus, nonischemic cardiomyopathy (EF 25-30% 2013, EF 2020 25-30%), ICD placed 08/2018, PSVT (elected for medical therapy over RFA), DVT/PE, Paroxysmal afib on amiodarone, Aflutter ablation 11/2016, lupus, and morbid obesityinitially seen for preop evaluation in setting of abdominal pain/acute diverticulitis. Followed for hx of CHF.   Assessment & Plan    Acute diverticulitis/Colon Mass/ Sepsis Patient was admitted 12/7 for acute diverticulitis and was being treated medically but did not improved. 12/15 he was taken to the OR found to have perforated cecum and surgery found a  mass. In the OR patient became hypotensive and required pressors. Patient was transferred to ICU for possible sepsis.  - Patient was extubated yesterday, still in ICU.  - Path from colon mass pending - IV abx per CCM - Patient still on pressors  Chronic systolic HF (LVEF 15-17%) - Followed by Advanced Heart Failure team - At baseline was on Entresto, spironolactone, and Torsemide. All were held during admission - Digoxin was continued. Level today <0.2 - creatinine 1.68 yesterday (?baseline 1.4) - AM labs pending - Receiving  IVF - Strict I/Os - Weight up 10 lbs since yesterday - Patient does not lower leg edema on exam. Abdomen firm  - continue to monitor fluid status  Paroxysmal Afib - Had Aflutter ablation 2018 - On amiodarone at home - Continue amiodarone drip, Rates are reasonable - Eliquis for stroke prevention>> was held for surgery and placed on heparin.  - EKG yesterday appears to be in NSR   For questions or updates, please contact [facility] Please consult [email or web address] for contact info under        Signed, Cadence Ninfa Meeker, PA-C  12/30/2018, 8:04 AM    I have seen and examined this patient with Cadence Furth PA-C.  Agree with above, with changes or exceptions noted below.  On exam regular rhythm normal rate, lungs clear, no peripheral edema.  He remains on norepinephrine and vasopressin for pressor support, he is mentating clearly and understandably has abdominal pain but no chest pain or shortness of breath.  He does not feel that he is significantly volume overloaded.  We will continue to evaluate in the postoperative period, however no change from a cardiovascular perspective required today.  Elouise Munroe 12/30/2018

## 2018-12-30 NOTE — Progress Notes (Signed)
Pt extubated to 4L [place], tolerating well. BS clear bilaterally. 175 mL fentanyl wasted with Inocencio Homes.

## 2018-12-30 NOTE — Progress Notes (Signed)
PHARMACY - TOTAL PARENTERAL NUTRITION CONSULT NOTE   Indication: massive bowel resection, prolonged NPO  Patient Measurements: Height: 5\' 7"  (170.2 cm) Weight: (!) 324 lb 8.3 oz (147.2 kg) IBW/kg (Calculated) : 66.1 TPN AdjBW (KG): 84.8 Body mass index is 50.83 kg/m.  Assessment:  64 yom presented to the hospital with abdominal pain found to be acute diverticulitis. He was treated conservatively but without improvement. He was taken to the OR 12/15 and found to have a perforated cecum with leakage of stool throughout the abdominal cavity. To start TPN for nutrition support today. He remains on the ventilator with vasopressors. Patient has been NPO or clear liquids since admission so he may be at risk for refeeding syndrome.   Glucose / Insulin: A1c 6.3, has only required 3 units SSI since starting last night Electrolytes: Phos is down to 1.7, K 3.8, Mg WNL Renal: SCr 1.35 (down), UOP 0.5 mL/kg/hr LFTs / TGs: LFTs WNL, TG 67 Prealbumin / albumin: prealbumin 10.1, albumin 2.2 Intake / Output; MIVF: Drain O/P 210, NG O/P 175, +1.4L GI Imaging: 12/14 Abd xray: Persistent bowel dilatation. No free air demonstrable 12/14 CT abd: Worsening distal large-bowel obstruction at the level of the descending colon correlating with an approximately 5 cm in length segment of irregular annular wall thickening with underlying colonic diverticulosis and mild pericolonic fat stranding 12/9 Abd xray: Worsening gaseous distension of the proximal colon and now involving much of the small bowel Surgeries / Procedures:  12/15 Exlap, subtotal colectomy and end ileostomy 12/10 Flexible sigmoidoscopy  Central access: PICC 12/15 TPN start date: 12/16  Nutritional Goals (per RD recommendation on 12/15): kCal: 1608-1800, Protein: 165-175, Fluid: >/=1.8 L/day Goal TPN rate is 80 mL/hr (provides 163 g of protein and 1958 kcals per day)  Current Nutrition:  TPN   Plan:  Increase TPN cautiously to 68mL/hr at  1800 - provides 122g protein and 1469kcal (~75% of goal) Hold lipid emulsion for first 7 days for critically ill patients per ASPEN guidelines (Start date 12/23) Electrolytes in TPN: 33mEq/L of Na, 70mEq/L of K, 48mEq/L of Ca, 15mEq/L of Mg, and 28mmol/L of Phos. Cl:Ac ratio 1:1 - all electrolytes will increase with increasing rate Add standard MVI MWF and trace elements daily to TPN Supplement potassium phos 20 mmol IV x 1 Continue Moderate q4h SSI and adjust as needed  Monitor TPN labs   Roberto [name], Roberto [name] 12/30/2018,8:44 AM

## 2018-12-30 NOTE — Progress Notes (Signed)
2 Days Post-Op   Subjective/Chief Complaint: Extubated, decreased pressors, feels pretty good   Objective: Vital signs in last 24 hours: Temp:  [99.2 F (37.3 C)-103 F (39.4 C)] 100.7 F (38.2 C) (12/17 0400) Pulse Rate:  [28-144] 98 (12/17 0830) Resp:  [12-43] 22 (12/17 0830) BP: (74-167)/(30-153) 101/67 (12/17 0815) SpO2:  [71 %-100 %] 98 % (12/17 0830) Arterial Line BP: (109-152)/(39-63) 109/52 (12/16 1700) Weight:  [147.2 kg] 147.2 kg (12/17 0500) Last BM Date: 12/25/18  Intake/Output from previous day: 12/16 0701 - 12/17 0700 In: 3632.1 [I.V.:1977.4; IV Piggyback:1654.7] Out: 2490 [Urine:1755; Emesis/NG output:175; Drains:210; Stool:350] Intake/Output this shift: Total I/O In: 203.5 [I.V.:178.5; IV Piggyback:25] Out: -   GI; wound clean, drain serosang, ileostomy functional with some liquid output  Lab Results:  Recent Labs    12/29/18 0500 12/30/18 0447  WBC 12.7* 13.6*  HGB 12.5* 10.0*  HCT 38.4* 32.9*  PLT 187 109*   BMET Recent Labs    12/29/18 0500 12/30/18 0558  NA 144 145  K 4.2 3.8  CL 109 110  CO2 25 25  GLUCOSE 123* 144*  BUN 16 13  CREATININE 1.68* 1.35*  CALCIUM 8.1* 8.0*   PT/INR Recent Labs    12/28/18 1410  LABPROT 18.2*  INR 1.5*   ABG Recent Labs    12/28/18 1117 12/28/18 1425  PHART 7.374 7.367  HCO3 28.1* 21.8    Studies/Results: DG CHEST PORT 1 VIEW  Result Date: 12/28/2018 CLINICAL DATA:  Respiratory failure. EXAM: PORTABLE CHEST 1 VIEW COMPARISON:  September 04, 2018. FINDINGS: Stable cardiomediastinal silhouette. Endotracheal and nasogastric tubes appear to be in grossly good position. Stable left-sided pacemaker is noted. Mild central pulmonary vascular congestion is noted. No pneumothorax or pleural effusion is noted. Hypoinflation of the lungs is noted with mild bibasilar subsegmental atelectasis. Bony thorax is unremarkable. IMPRESSION: Stable support apparatus. Hypoinflation of the lungs with mild bibasilar  subsegmental atelectasis. Stable mild central pulmonary vascular congestion is noted. Electronically Signed   By: Marijo Conception M.D.   On: 12/28/2018 14:25   [place] EKG SITE RITE  Result Date: 12/28/2018 If Site Rite image not attached, placement could not be confirmed due to current cardiac rhythm.   Anti-infectives: Anti-infectives (From admission, onward)   Start     Dose/Rate Route Frequency Ordered Stop   12/28/18 1400  fluconazole (DIFLUCAN) IVPB 400 mg     400 mg 100 mL/hr over 120 Minutes Intravenous Every 24 hours 12/28/18 1318     12/20/18 2200  piperacillin-tazobactam (ZOSYN) IVPB 3.375 g     3.375 g 12.5 mL/hr over 240 Minutes Intravenous Every 8 hours 12/20/18 1834     12/20/18 0645  piperacillin-tazobactam (ZOSYN) IVPB 3.375 g  Status:  Discontinued     3.375 g 100 mL/hr over 30 Minutes Intravenous Every 6 hours 12/20/18 0630 12/20/18 1833   12/19/18 2315  piperacillin-tazobactam (ZOSYN) IVPB 3.375 g     3.375 g 100 mL/hr over 30 Minutes Intravenous  Once 12/19/18 2302 12/20/18 0003      Assessment/Plan: POD 2 STC/ileostomy for left colon mass (path pending) and perforated right colon- Roberto [name] -TPN, continue ng for another day, likely out tomorrow and start some po -path pending -bid dressing changes -woc consult -can restart heparin today  CHF EF 25-30% Atrial fibrillation Hx DVT/PE LV mural thrombus Gross anticoagulant Morbid obesityBMI 50.63 CKDcreatinine 1.81>>1.44>>1.29>>1.43>>1.54 Dehydration  Per patient request I called his daughter Roberto [name] today and talked to her.  Her number is [phone]  Emelia Loron 12/30/2018

## 2018-12-30 NOTE — Progress Notes (Addendum)
NAME:  Roberto [name], MRN:  [id number], DOB:  [birth year], LOS: 10 ADMISSION DATE:  12/19/2018, CONSULTATION DATE:  12/28/2018 REFERRING MD: Kirby Crigler, CHIEF COMPLAINT: Left colonic stricture of unknown etiology, perforated cecum   Information obtained  from medical record as patient is intubated and sedated  Brief History   Pt. With history of NICM, EF 25-30%, PSVT, PAF, chronic systolic HF with AICD, hx of DVT/PE/LV thrombus , on chronic anticoagulation ( bridged with heparin)  , was   admitted 12/20/2018 for acute diverticulitis. He was initially treated conservatively, but had no real improvement and was taken to the OR 12/28/2018.Pt was found to have a perforated cecum with leakage of stool throughout abdominal cavity. He was brought from the OR to NICU intubated and on pressors. PCCM have been asked to admit and manage care.  History of present illness   Pt. With history of NICM, EF 25-30%, PSVT, PAF, chronic systolic HF with AICD, hx of DVT/PE/LV thrombus , on chronic anticoagulation ( bridged with heparin)  , was   admitted 12/20/2018 for acute diverticulitis. He was initially treated conservatively, but had no real improvement and was taken to the OR 12/28/2018. Pt had Left colonic stricture of unknown etiology, and perforated cecum. Per surgery's note there was a mass in the mid descending colon. The  right colon  was ischemic and had perforated. There was visualization of leakage of a large amount of stool throughout his entire abdominal cavity.  In OR patient became hypotensive , required pressors  (Phenylephrine and Levo).  Surgery felt the patient was not ready for extubation , and considering co morbidities and possible septic insult   asked PCCM to admit to ICU manage care .  Past Medical History   Past Medical History:  Diagnosis Date  . AICD (automatic cardioverter/defibrillator) present 08/2018  . Arthritis   . Chronic combined systolic and diastolic CHF (congestive heart  failure) (HCC)   . Deep vein thrombosis (HCC)   . Diverticulitis   . Lupus anticoagulant positive   . Morbid obesity (HCC)   . Mural thrombus of heart    coumadin  . Nonischemic cardiomyopathy (HCC)    a) 12/17/11 echo: LVEF 25-30%, grade 3 diastolic dysfunction (c/w restriction), mod MR, mod LA/LV and mild RA dilatation; b) 12/18/11 cMRI: LVEF 38%, mod LV/mild RV dilatation, global HK, mild-mod RV sys dysfxn, no LV thrombus & patchy non-subendocardial delayed enhancement c/w infil dz or prior myocarditis; c. 07/2018 EF 25-30%.  . Paroxysmal SVT (supraventricular tachycardia) (HCC)   . Pulmonary embolism (HCC)    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    Significant Hospital Events   12/20/2018 Admission 12/28/2018 To OR >> perforated cecum with stool contamination to abdomen  Consults:  Surgery GI Cards  Procedures:  12/28/2018 Exploratory laparotomy Subtotal colectomy End ileostomy  Significant Diagnostic Tests:  12/14 CT Abdomen Pelvis Worsening distal large-bowel obstruction at the level of the descending colon correlating with an approximately 5 cm in length segment of irregular annular wall thickening with underlying colonic diverticulosis and mild pericolonic fat stranding. Imaging findings remain indeterminate for malignant versus diverticular descending colonic stricture. New diffuse small bowel dilatation with air-fluid levels. No free air or abscess. Chronic findings include: Mild cardiomegaly. Small hiatal hernia. Posterior liver masses, decreased in size since 2010, compatible with benign lesions.  12/27/2018 KUB Nasogastric tube tip and side port in stomach. Persistent bowel dilatation. No free air demonstrable  12/23/2018  Biopsy >> Colon COLON, DESCENDING, BIOPSY:  -  Benign colonic mucosa with mild reactive changes.  - No active inflammation.  - No dysplasia or malignancy  Micro Data:  12/20/2018  SARS Coronavirus 2 NEGATIVE NEGATIVE   12/20/2018 MRSA, PCR  NEGATIVE NEGATIVE  NEGATIVE   Staphylococcus aureus NEGATIVE NEGATIVE  NEGATIVE    Antimicrobials:  Zosyn 12/20/2018>> Diflucan 12/15>> Interim history/subjective:  S/p intubation for OR. Extubated yesterday without event.  Objective   Blood pressure 122/77, pulse 99, temperature (!) 100.7 F (38.2 C), temperature source Axillary, resp. rate (!) 31, height 5\' 7"  (1.702 m), weight (!) 147.2 kg, SpO2 99 %.    Vent Mode: PRVC FiO2 (%):  [40 %] 40 % Set Rate:  [18 bmp] 18 bmp Vt Set:  [520 mL] 520 mL PEEP:  [5 cmH20] 5 cmH20 Plateau Pressure:  [18 cmH20] 18 cmH20   Intake/Output Summary (Last 24 hours) at 12/30/2018 0718 Last data filed at 12/30/2018 0600 Gross per 24 hour  Intake 3632.11 ml  Output 2490 ml  Net 1142.11 ml   Filed Weights   12/28/18 1245 12/29/18 0500 12/30/18 0500  Weight: (!) 141.1 kg (!) 142.7 kg (!) 147.2 kg   Physical Exam: General: Obese male laying in bed, no acute distress HENT: [place], AT, OP clear, MMM, NGT in place Eyes: EOMI, no scleral icterus Respiratory: Diminished anterior breath sounds. No rhonchi or wheezing Cardiovascular: RRR, -M/R/G, no JVD GI: BS+, soft, midline dressing in place, colostomy with stool Extremities:Non-pitting edema in lower extremities,-tenderness Neuro: AAO x4, CNII-XII grossly intact, moves extremities x 4 Psych: Normal mood, normal affect GU: Foley in place  Resolved Hospital Problem list     Assessment & Plan:   # Septic shock 2/2 perforated cecum and contamination of abdominal cavity with stool s/p subtotal colectomy and end ileostomy 12/15 # Colon mass - IVF bolus now - Wean levophed + vasopressin - Continue zosyn, fluconazole - Rectal tylenol - Wound care, drain management per surgery - f/u path from colon mass 12/15  # Post-op fever - Will monitor fever curve - If re-fevers today, will obtain blood cultures - Continue abx as above  # Acute hypoxic respiratory failure Extubated yesterday to nasal  cannula. Currently on 2L. - Wean supplemental oxygen for goal >88%.  - Encourage IS  # AKI Good UOP - Continue foley with strict I&Os for critical illness - Trend UOP/Cr  # Chronic systolic and diastolic heart failure (EF 25-30%) # S/p ICD # Atrial fibrillation  - Amiodarone gtt - Home digoxin - Start heparin gtt today. Monitor H/H and s/s bleed  # Hx DVT and PE  Best practice:  Diet: NPO/ TNA Pain/Anxiety/Delirium protocol (if indicated): PRN morphine for pain VAP protocol (if indicated): -- DVT prophylaxis: PAS hose, start heparin gtt today GI prophylaxis: Protonix  Glucose control: CBG /  SSI Mobility: BR Code Status: FC Family Communication: Updated patient at bedside Disposition: ICU   The patient is critically ill with multiple organ systems failure and requires high complexity decision making for assessment and support, frequent evaluation and titration of therapies, application of advanced monitoring technologies and extensive interpretation of multiple databases.   Critical Care Time devoted to patient care services described in this note is 48 Minutes.   Rodman Pickle, M.D. [facility] Pulmonary/Critical Care Medicine 12/30/2018 7:48 AM   Please see Amion for pager number to reach on-call Pulmonary and Critical Care Team.

## 2018-12-31 DIAGNOSIS — E861 Hypovolemia: Secondary | ICD-10-CM

## 2018-12-31 DIAGNOSIS — I9589 Other hypotension: Secondary | ICD-10-CM

## 2018-12-31 LAB — GLUCOSE, CAPILLARY
Glucose-Capillary: 141 mg/dL — ABNORMAL HIGH (ref 70–99)
Glucose-Capillary: 146 mg/dL — ABNORMAL HIGH (ref 70–99)
Glucose-Capillary: 149 mg/dL — ABNORMAL HIGH (ref 70–99)
Glucose-Capillary: 152 mg/dL — ABNORMAL HIGH (ref 70–99)
Glucose-Capillary: 155 mg/dL — ABNORMAL HIGH (ref 70–99)
Glucose-Capillary: 162 mg/dL — ABNORMAL HIGH (ref 70–99)

## 2018-12-31 LAB — MAGNESIUM: Magnesium: 1.8 mg/dL (ref 1.7–2.4)

## 2018-12-31 LAB — RENAL FUNCTION PANEL
Albumin: 1.5 g/dL — ABNORMAL LOW (ref 3.5–5.0)
Anion gap: 7 (ref 5–15)
BUN: 11 mg/dL (ref 6–20)
CO2: 27 mmol/L (ref 22–32)
Calcium: 7.7 mg/dL — ABNORMAL LOW (ref 8.9–10.3)
Chloride: 110 mmol/L (ref 98–111)
Creatinine, Ser: 1.25 mg/dL — ABNORMAL HIGH (ref 0.61–1.24)
GFR calc Af Amer: >60 mL/min (ref >60)
GFR calc non Af Amer: >60 mL/min (ref >60)
Glucose, Bld: 157 mg/dL — ABNORMAL HIGH (ref 70–99)
Phosphorus: 1.5 mg/dL — ABNORMAL LOW (ref 2.5–4.6)
Potassium: 3.7 mmol/L (ref 3.5–5.1)
Sodium: 144 mmol/L (ref 135–145)

## 2018-12-31 LAB — CBC
HCT: 32.5 % — ABNORMAL LOW (ref 39.0–52.0)
Hemoglobin: 10 g/dL — ABNORMAL LOW (ref 13.0–17.0)
MCH: 29.2 pg (ref 26.0–34.0)
MCHC: 30.8 g/dL (ref 30.0–36.0)
MCV: 94.8 fL (ref 80.0–100.0)
Platelets: 122 10*3/uL — ABNORMAL LOW (ref 150–400)
RBC: 3.43 MIL/uL — ABNORMAL LOW (ref 4.22–5.81)
RDW: 14.4 % (ref 11.5–15.5)
WBC: 16.8 10*3/uL — ABNORMAL HIGH (ref 4.0–10.5)
nRBC: 0.1 % (ref 0.0–0.2)

## 2018-12-31 LAB — DIGOXIN LEVEL: Digoxin Level: 0.3 ng/mL — ABNORMAL LOW (ref 0.8–2.0)

## 2018-12-31 LAB — HEPARIN LEVEL (UNFRACTIONATED): Heparin Unfractionated: 0.36 IU/mL (ref 0.30–0.70)

## 2018-12-31 MED ORDER — HYDROMORPHONE HCL 1 MG/ML IJ SOLN
0.5000 mg | INTRAMUSCULAR | Status: DC | PRN
  Administered 2019-01-02 – 2019-01-03 (×3): 0.5 mg via INTRAVENOUS
  Filled 2018-12-31 (×3): qty 1

## 2018-12-31 MED ORDER — TRACE MINERALS CU-MN-SE-ZN 300-55-60-3000 MCG/ML IV SOLN
INTRAVENOUS | Status: DC
  Filled 2018-12-31: qty 1088

## 2018-12-31 MED ORDER — POTASSIUM PHOSPHATES 15 MMOLE/5ML IV SOLN
20.0000 mmol | Freq: Once | INTRAVENOUS | Status: AC
  Administered 2018-12-31: 20 mmol via INTRAVENOUS
  Filled 2018-12-31: qty 6.67

## 2018-12-31 MED ORDER — TRACE MINERALS CU-MN-SE-ZN 300-55-60-3000 MCG/ML IV SOLN
INTRAVENOUS | Status: DC
  Filled 2018-12-31: qty 960

## 2018-12-31 MED ORDER — METHOCARBAMOL 1000 MG/10ML IJ SOLN
500.0000 mg | Freq: Three times a day (TID) | INTRAVENOUS | Status: DC
  Administered 2018-12-31 – 2019-01-05 (×13): 500 mg via INTRAVENOUS
  Filled 2018-12-31: qty 5
  Filled 2018-12-31: qty 500
  Filled 2018-12-31 (×5): qty 5
  Filled 2018-12-31: qty 500
  Filled 2018-12-31: qty 5
  Filled 2018-12-31: qty 500
  Filled 2018-12-31 (×11): qty 5

## 2018-12-31 MED ORDER — TRACE MINERALS CU-MN-SE-ZN 300-55-60-3000 MCG/ML IV SOLN
INTRAVENOUS | Status: AC
  Filled 2018-12-31: qty 985.6

## 2018-12-31 MED ORDER — ACETAMINOPHEN 10 MG/ML IV SOLN
1000.0000 mg | Freq: Four times a day (QID) | INTRAVENOUS | Status: AC
  Administered 2018-12-31 – 2019-01-01 (×4): 1000 mg via INTRAVENOUS
  Filled 2018-12-31 (×4): qty 100

## 2018-12-31 MED ORDER — LACTATED RINGERS IV BOLUS
500.0000 mL | Freq: Once | INTRAVENOUS | Status: AC
  Administered 2018-12-31: 08:00:00 500 mL via INTRAVENOUS

## 2018-12-31 NOTE — Progress Notes (Signed)
PHARMACY - TOTAL PARENTERAL NUTRITION CONSULT NOTE   Indication: massive bowel resection, prolonged NPO  Patient Measurements: Height: 5\' 7"  (170.2 cm) Weight: (!) 321 lb 10.4 oz (145.9 kg) IBW/kg (Calculated) : 66.1 TPN AdjBW (KG): 86.1 Body mass index is 50.38 kg/m.  Assessment:  69 yom presented to the hospital with abdominal pain found to be acute diverticulitis. He was treated conservatively but without improvement. He was taken to the OR 12/15 and found to have a perforated cecum with leakage of stool throughout the abdominal cavity. To start TPN for nutrition support today. He remains on the ventilator with vasopressors. Patient has been NPO or clear liquids since admission so he may be at risk for refeeding syndrome.   Glucose / Insulin: A1c 6.3, CBGs 121-162 requiring 17 units SSI last 24 hrs. Electrolytes: Mg/K+ WNL but trending down. Phos 1.5 remains low (suppl KPhos 12/17 and 12/18) Renal: SCr 1.25 down. UOP 0.4 LFTs / TGs: LFTs WNL, TG 67 Prealbumin / albumin: prealbumin 10.1, albumin 2.2 Intake / Output; MIVF: drain O/P 195, NG O/P 300 up. +5.9L total. GI Imaging: 12/14 Abd xray: Persistent bowel dilatation. No free air demonstrable 12/14 CT abd: Worsening distal large-bowel obstruction at the level of the descending colon correlating with an approximately 5 cm in length segment of irregular annular wall thickening with underlying colonic diverticulosis and mild pericolonic fat stranding 12/9 Abd xray: Worsening gaseous distension of the proximal colon and now involving much of the small bowel Surgeries / Procedures:  12/15 Exlap, subtotal colectomy and end ileostomy 12/10 Flexible sigmoidoscopy  Central access: PICC 12/15 TPN start date: 12/16  Nutritional Goals (per RD recommendation on 12/17): KCal: 2200-2400, Protein: 130-150, Fluid: >/=1.8 L/day Goal TPN rate is 80 mL/hr (provides 147 g of protein and 1280 kcals per day)  Current Nutrition:  TPN   Plan:   Increase TPN to 12mL/hr at 1800 - provides 147g protein, 384g dextrose and 1896 kcal Hold lipid emulsion for first 7 days for critically ill patients per ASPEN guidelines (Start date 12/23) Electrolytes in TPN: no change - all electrolytes amounts will increase with increasing rate Add standard MVI MWF and trace elements daily to TPN Supplement potassium phos 20 mmol IV x 1 again today Continue Moderate q4h SSI and adjust as needed  May need to add insulin to TPN tomorrow with increased rate. Monitor TPN labs    Minyon Billiter S. Alford Highland, PharmD, BCPS Clinical Staff Pharmacist [email or web address] Alford Highland, [employer] 12/31/2018,8:37 AM

## 2018-12-31 NOTE — Progress Notes (Addendum)
 Progress Note  Patient Name: Roberto [name] Date of Encounter: 12/31/2018  Primary Cardiologist: Daniel Bensimhon, MD   Subjective   NG tube is out. Receiving TPN. Patient feeling better today. Denies chest pain. On 2L O2.  Inpatient Medications    Scheduled Meds: . sodium chloride   Intravenous Once  . chlorhexidine gluconate (MEDLINE KIT)  15 mL Mouth Rinse BID  . Chlorhexidine Gluconate Cloth  6 each Topical Daily  . digoxin  0.125 mg Intravenous Daily  . insulin aspart  0-15 Units Subcutaneous Q4H  . mouth rinse  15 mL Mouth Rinse 10 times per day  . pantoprazole (PROTONIX) IV  40 mg Intravenous Q24H  . sodium chloride flush  10-40 mL Intracatheter Q12H   Continuous Infusions: . sodium chloride Stopped (12/20/18 1319)  . sodium chloride    . acetaminophen    . fluconazole (DIFLUCAN) IV Stopped (12/30/18 1455)  . heparin 2,300 Units/hr (12/31/18 0958)  . methocarbamol (ROBAXIN) IV    . norepinephrine (LEVOPHED) Adult infusion 6 mcg/min (12/31/18 0804)  . piperacillin-tazobactam (ZOSYN)  IV 3.375 g (12/31/18 0558)  . potassium PHOSPHATE IVPB (in mmol) 20 mmol (12/31/18 1005)  . TPN ADULT (ION) 60 mL/hr at 12/30/18 2200  . TPN ADULT (ION)    . vasopressin (PITRESSIN) infusion - *FOR SHOCK* 0.04 Units/min (12/31/18 0800)   PRN Meds: sodium chloride, acetaminophen, HYDROmorphone (DILAUDID) injection, morphine injection, sodium chloride flush   Vital Signs    Vitals:   12/31/18 0730 12/31/18 0800 12/31/18 0900 12/31/18 1000  BP:  (!) 97/57 (!) 107/59 (!) 106/56  Pulse: 86 (!) 42 (!) 48 88  Resp: (!) 34 (!) 30 (!) 29 (!) 25  Temp:  99.9 F (37.7 C)    TempSrc:  Oral    SpO2: 93% 96% 95% 95%  Weight:      Height:        Intake/Output Summary (Last 24 hours) at 12/31/2018 1117 Last data filed at 12/31/2018 1005 Gross per 24 hour  Intake 4863.03 ml  Output 2380 ml  Net 2483.03 ml   Last 3 Weights 12/31/2018 12/30/2018 12/29/2018  Weight (lbs) 321 lb 10.4  oz 324 lb 8.3 oz 314 lb 9.5 oz  Weight (kg) 145.9 kg 147.2 kg 142.7 kg      Telemetry    NSR, HR 80-90s with frequent PVCs and PACs - Personally Reviewed  ECG    No new - Personally Reviewed  Physical Exam   GEN: No acute distress.   Neck: No JVD Cardiac: RRR, no murmurs, rubs, or gallops.  Respiratory: Clear to auscultation bilaterally; 2L O2 GI: +tender, +distended  MS: No edema; No deformity. Neuro:  Nonfocal  Psych: Normal affect   Labs    High Sensitivity Troponin:  No results for input(s): TROPONINIHS in the last 720 hours.    Chemistry Recent Labs  Lab 12/28/18 1410 12/29/18 0500 12/30/18 0558 12/31/18 0451  NA 143 144 145 144  K 3.8 4.2 3.8 3.7  CL 105 109 110 110  CO2 22 25 25 27  GLUCOSE 130* 123* 144* 157*  BUN 19 16 13 11  CREATININE 1.93* 1.68* 1.35* 1.25*  CALCIUM 7.9* 8.1* 8.0* 7.7*  PROT 5.4* 5.3* 5.2*  --   ALBUMIN 2.4* 2.2* 1.9* 1.5*  AST 24 22 25  --   ALT 24 21 18  --   ALKPHOS 26* 25* 27*  --   BILITOT 1.7* 1.1 1.0  --   GFRNONAA 39* 46* 60* >60    GFRAA 45* 53* >60 >60  ANIONGAP 16* _0 Hematology Recent Labs  Lab 12/30/18 0447 12/30/18 1831 12/31/18 0451  WBC 13.6* 17.0* 16.8*  RBC 3.44* 3.55* 3.43*  HGB 10.0* 10.4* 10.0*  HCT 32.9* 33.5* 32.5*  MCV 95.6 94.4 94.8  MCH 29.1 29.3 29.2  MCHC 30.4 31.0 30.8  RDW 14.2 14.2 14.4  PLT 109* 125* 122*    BNPNo results for input(s): BNP, PROBNP in the last 168 hours.   DDimer No results for input(s): DDIMER in the last 168 hours.   Radiology    No results found.  Cardiac Studies   Echocardiogram 07/20/2018 IMPRESSIONS  1. The left ventricle has severely reduced systolic function, with an ejection fraction of 25-30%. The cavity size was moderately dilated. Left ventricular diastolic Doppler parameters are consistent with pseudonormalization. Left ventricular diffuse  hypokinesis. 2. The right ventricle has normal systolic function. The cavity was normal. There is  no increase in right ventricular wall thickness. 3. Left atrial size was mildly dilated. 4. No evidence of mitral valve stenosis. Trivial mitral regurgitation. 5. The aortic valve is tricuspid. No stenosis of the aortic valve. 6. The aortic root is normal in size and structure. 7. The inferior vena cava was dilated in size with <50% respiratory variability. No complete TR doppler jet so unable to estimate PA systolic pressure.  Patient Profile     53 y.o. male with a hx of chronic diastolic HF,h/o ofLv thrombus, nonischemic cardiomyopathy (EF 25-30% 2013, EF 2020 25-30%), ICD placed 08/2018, PSVT (elected for medical therapy over RFA), DVT/PE, Paroxysmal afib on amiodarone, Aflutter ablation 11/2016, lupus, and morbid obesityinitially seen for preop evaluation in setting of abdominal pain/acute diverticulitis. Followed for hx of CHF.   Assessment & Plan    Acute diverticulitis/Colon Mass/ Sepsis Patient was admitted 12/7 for acute diverticulitis and was being treated medically but did not improved. 12/15 he was taken to the OR found to have perforated cecum and surgery found a mass. In the OR patient became hypotensive and required pressors. Patient was transferred to ICU for possible sepsis.  - Patient was extubated yesterday, still in ICU.  - Path from colon mass pending - IV abx per CCM - Patient still on pressors - He is receiving TPN  Chronic systolic HF (LVEF 59-93%) - Followed by Advanced Heart Failure team - At baseline was on Entresto, spironolactone, and Torsemide. All were held during admission - Digoxin was continued. Level today 0.3 - creatinine 1.25 (?baseline 1.4) - Since admission+5.6 L. ?Off IVF. Fluid intake by TPN - Weight up down 3 lbs since yesterday - Patient does not lower leg edema on exam. Abdomen firm  - continue to monitor fluid status  Paroxysmal Afib - Had Aflutter ablation 2018 - On amiodarone at home - Eliquis for stroke prevention>> was held  for surgery and placed on heparin.  - Patient has been maintain NSR    For questions or updates, please contact [place] Please consult [email or web address] for contact info under        Signed, Cadence Ninfa Meeker, PA-C  12/31/2018, 11:17 AM    I have seen and examined the patient with Cadence Furth PA-C.  Agree with above, with the following changes or exceptions.  On exam regular rate and rhythm, no murmurs, lungs clear, no peripheral edema.  He continues on pressors for blood pressure support and received 1 L of IV fluids per his nurse today.  He does not appear  to have pulmonary congestion or peripheral edema as a result.  Continue to support hemodynamically.  Cardiology will follow along.  Gayatri A Acharya 12/31/18  

## 2018-12-31 NOTE — Progress Notes (Signed)
NAME:  Roberto [name], MRN:  [id number], DOB:  06-28-1965, LOS: 11 ADMISSION DATE:  12/19/2018, CONSULTATION DATE:  12/28/2018 REFERRING MD: Kirby Crigler, CHIEF COMPLAINT: Left colonic stricture of unknown etiology, perforated cecum   Information obtained  from medical record as patient is intubated and sedated  Brief History   Pt. With history of NICM, EF 25-30%, PSVT, PAF, chronic systolic HF with AICD, hx of DVT/PE/LV thrombus , on chronic anticoagulation ( bridged with heparin)  , was   admitted 12/20/2018 for acute diverticulitis. He was initially treated conservatively, but had no real improvement and was taken to the OR 12/28/2018.Pt was found to have a perforated cecum with leakage of stool throughout abdominal cavity. He was brought from the OR to NICU intubated and on pressors. PCCM have been asked to admit and manage care.  History of present illness   Pt. With history of NICM, EF 25-30%, PSVT, PAF, chronic systolic HF with AICD, hx of DVT/PE/LV thrombus , on chronic anticoagulation ( bridged with heparin)  , was   admitted 12/20/2018 for acute diverticulitis. He was initially treated conservatively, but had no real improvement and was taken to the OR 12/28/2018. Pt had Left colonic stricture of unknown etiology, and perforated cecum. Per surgery's note there was a mass in the mid descending colon. The  right colon  was ischemic and had perforated. There was visualization of leakage of a large amount of stool throughout his entire abdominal cavity.  In OR patient became hypotensive , required pressors  (Phenylephrine and Levo).  Surgery felt the patient was not ready for extubation , and considering co morbidities and possible septic insult   asked PCCM to admit to ICU manage care .  Past Medical History   Past Medical History:  Diagnosis Date  . AICD (automatic cardioverter/defibrillator) present 08/2018  . Arthritis   . Chronic combined systolic and diastolic CHF (congestive heart  failure) (HCC)   . Deep vein thrombosis (HCC)   . Diverticulitis   . Lupus anticoagulant positive   . Morbid obesity (HCC)   . Mural thrombus of heart    coumadin  . Nonischemic cardiomyopathy (HCC)    a) 12/17/11 echo: LVEF 25-30%, grade 3 diastolic dysfunction (c/w restriction), mod MR, mod LA/LV and mild RA dilatation; b) 12/18/11 cMRI: LVEF 38%, mod LV/mild RV dilatation, global HK, mild-mod RV sys dysfxn, no LV thrombus & patchy non-subendocardial delayed enhancement c/w infil dz or prior myocarditis; c. 07/2018 EF 25-30%.  . Paroxysmal SVT (supraventricular tachycardia) (HCC)   . Pulmonary embolism (HCC)    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    Significant Hospital Events   12/20/2018 Admission 12/28/2018 To OR >> perforated cecum with stool contamination to abdomen  Consults:  Surgery GI Cards  Procedures:  12/28/2018 Exploratory laparotomy Subtotal colectomy End ileostomy  Significant Diagnostic Tests:  12/14 CT Abdomen Pelvis Worsening distal large-bowel obstruction at the level of the descending colon correlating with an approximately 5 cm in length segment of irregular annular wall thickening with underlying colonic diverticulosis and mild pericolonic fat stranding. Imaging findings remain indeterminate for malignant versus diverticular descending colonic stricture. New diffuse small bowel dilatation with air-fluid levels. No free air or abscess. Chronic findings include: Mild cardiomegaly. Small hiatal hernia. Posterior liver masses, decreased in size since 2010, compatible with benign lesions.  12/27/2018 KUB Nasogastric tube tip and side port in stomach. Persistent bowel dilatation. No free air demonstrable  12/23/2018  Biopsy >> Colon COLON, DESCENDING, BIOPSY:  -  Benign colonic mucosa with mild reactive changes.  - No active inflammation.  - No dysplasia or malignancy  Micro Data:  12/20/2018  SARS Coronavirus 2 NEGATIVE NEGATIVE   12/20/2018 MRSA, PCR  NEGATIVE NEGATIVE  NEGATIVE   Staphylococcus aureus NEGATIVE NEGATIVE  NEGATIVE    Antimicrobials:  Zosyn 12/20/2018>> Diflucan 12/15>> Interim history/subjective:  No events overnight. Weaning on levophed. Reports mild abdominal pain with movement.  Objective   Blood pressure 105/60, pulse 80, temperature 99 F (37.2 C), temperature source Oral, resp. rate (!) 25, height 5\' 7"  (1.702 m), weight (!) 145.9 kg, SpO2 93 %.        Intake/Output Summary (Last 24 hours) at 12/31/2018 1757 Last data filed at 12/31/2018 1708 Gross per 24 hour  Intake 4218.34 ml  Output 2945 ml  Net 1273.34 ml   Filed Weights   12/29/18 0500 12/30/18 0500 12/31/18 0500  Weight: (!) 142.7 kg (!) 147.2 kg (!) 145.9 kg   Physical Exam: General: Obese male laying in bed, no acute distress HENT: [place], AT, OP clear, MMM, NGT in place Eyes: EOMI, no scleral icterus Respiratory: Diminished anterior breath sounds.  No crackles, wheezing or rales Cardiovascular: RRR, -M/R/G, no JVD GI: BS+, soft, nontender, midline dressing in place, colostomy with stool Extremities:-Edema,-tenderness Neuro: AAO x4, CNII-XII grossly intact, moves extremities x 4 Psych: Normal mood, normal affect GU: Foley in place  Resolved Hospital Problem list     Assessment & Plan:   # Septic shock 2/2 perforated cecum and contamination of abdominal cavity with stool s/p subtotal colectomy and end ileostomy 12/15 # Colon mass - Repeat IVF bolus - Wean levophed + vasopressin. Hopefully can wean off today or tomorrow - Continue zosyn, fluconazole - Rectal tylenol - Wound care, drain management per surgery - f/u path from colon mass 12/15  # Post-op fever - Will monitor fever curve - If re-fevers today, will obtain blood cultures - Continue abx as above  # Acute hypoxic respiratory failure - resolved. Possible post-op atelectasis from pain  On RA - Encourage IS  # AKI Good UOP. Improving - Continue foley with strict I&Os for  critical illness - Trend UOP/Cr  # Chronic systolic and diastolic heart failure (EF 25-30%) # S/p ICD # Atrial fibrillation  - Amiodarone gtt - Home digoxin - Continue heparin gtt today  # Hx DVT and PE  Best practice:  Diet: NPO/ TNA  Pain/Anxiety/Delirium protocol (if indicated): PRN morphine for pain VAP protocol (if indicated): -- DVT prophylaxis: PAS hose, heparin gtt  GI prophylaxis: Protonix  Glucose control: CBG /  SSI Mobility: BR Code Status: FC Family Communication: Updated patient at bedside  Disposition: ICU  The patient is critically ill with multiple organ systems failure and requires high complexity decision making for assessment and support, frequent evaluation and titration of therapies, application of advanced monitoring technologies and extensive interpretation of multiple databases.   Critical Care Time devoted to patient care services described in this note is 35 Minutes. This time reflects time of care of this signee Dr. Rodman Pickle.   Rodman Pickle, M.D. [facility] Pulmonary/Critical Care Medicine 12/31/2018 6:01 PM   Please see Amion for pager number to reach on-call Pulmonary and Critical Care Team.

## 2018-12-31 NOTE — Progress Notes (Signed)
Pt's NG tube d/cd per Dr. Loman Brooklyn order.

## 2018-12-31 NOTE — Progress Notes (Signed)
ANTICOAGULATION CONSULT NOTE - Follow Up Consult  Pharmacy Consult for Heparin + Fluconazole Indication: hx VTE, LV mural thrombus, afib with intraabdominal infection  Allergies  Allergen Reactions  . Shrimp [Shellfish Allergy] Hives and Itching    Patient Measurements: Height: 5\' 7"  (170.2 cm) Weight: (!) 321 lb 10.4 oz (145.9 kg) IBW/kg (Calculated) : 66.1 Heparin Dosing Weight:  101.6 kg  Vital Signs: Temp: 99.9 F (37.7 C) (12/18 0800) Temp Source: Oral (12/18 0800) BP: 117/73 (12/18 0630) Pulse Rate: 86 (12/18 0730)  Labs: Recent Labs    12/28/18 1410 12/29/18 0500 12/30/18 0447 12/30/18 0558 12/30/18 1639 12/30/18 1831 12/31/18 0451  HGB 13.9 12.5* 10.0*  --   --  10.4* 10.0*  HCT 44.3 38.4* 32.9*  --   --  33.5* 32.5*  PLT 235 187 109*  --   --  125* 122*  APTT  --  29 32  --  78*  --   --   LABPROT 18.2*  --   --   --   --   --   --   INR 1.5*  --   --   --   --   --   --   HEPARINUNFRC  --   --   --   --  0.36  --  0.36  CREATININE 1.93* 1.68*  --  1.35*  --   --  1.25*    Estimated Creatinine Clearance: 94.7 mL/min (A) (by C-G formula based on SCr of 1.25 mg/dL (H)).   Assessment: Anticoag: Heparin (Apixaban PTA) for hx VTE, LV mural thrombus, afib- Hgb 10, plts 122 (down post-op). HL 0.36 in goal.  ID: Zosyn D#12, Flucon D#4 for IAI - Tmax 103.9, WBC 16.8, SCr 1.25 dropping, LA 1.7. No cultures  Zosyn 12/7>  Flucon 12/15>>  12/14 MRSA PCR: MRSA neg, SA neg 12/6 COVID: neg  Goal of Therapy:  Heparin level 0.3-0.7 units/ml Monitor platelets by anticoagulation protocol: Yes   Plan:  Heparin gtt 2300 units/hr. Daily HL and CBC Continue Fluconazole 400mg  IV daily Zosyn 3.375g IV q8 hrs  Estephany Perot S. Alford Highland, PharmD, BCPS Clinical Staff Pharmacist [email or web address]  Alford Highland, Ariel Wingrove Stillinger 12/31/2018,8:22 AM

## 2018-12-31 NOTE — Progress Notes (Signed)
[facility] Progress Note  3 Days Post-Op  Subjective: CC: pain Patient reports abdominal pain that is worse with movement. He has been trying to do some small movements in bed but is limited by pain. Denies nausea. Having some ileostomy output.   Objective: Vital signs in last 24 hours: Temp:  [98.5 F (36.9 C)-102.6 F (39.2 C)] 99.9 F (37.7 C) (12/18 0800) Pulse Rate:  [39-115] 42 (12/18 0800) Resp:  [17-37] 30 (12/18 0800) BP: (70-132)/(33-73) 97/57 (12/18 0800) SpO2:  [86 %-99 %] 96 % (12/18 0800) Weight:  [145.9 kg] 145.9 kg (12/18 0500) Last BM Date: 12/31/18  Intake/Output from previous day: 12/17 0701 - 12/18 0700 In: 5176.5 [I.V.:2885.5; IV Piggyback:2246.1] Out: 1995 [Urine:1425; Emesis/NG output:300; Drains:195; Stool:75] Intake/Output this shift: Total I/O In: 83 [I.V.:83] Out: 150 [Urine:150]  PE: Gen:  Alert, NAD, pleasant Card:  Regular rate and rhythm Pulm:  Normal effort, clear to auscultation bilaterally Abd: Soft, appropriately tender, +BS, midline wound clean with pale pink tissue, drain with SS fluid, ileostomy pink with some liquid stool  Skin: warm and dry, no rashes  Psych: A&Ox3   Lab Results:  Recent Labs    12/30/18 1831 12/31/18 0451  WBC 17.0* 16.8*  HGB 10.4* 10.0*  HCT 33.5* 32.5*  PLT 125* 122*   BMET Recent Labs    12/30/18 0558 12/31/18 0451  NA 145 144  K 3.8 3.7  CL 110 110  CO2 25 27  GLUCOSE 144* 157*  BUN 13 11  CREATININE 1.35* 1.25*  CALCIUM 8.0* 7.7*   PT/INR Recent Labs    12/28/18 1410  LABPROT 18.2*  INR 1.5*   CMP     Component Value Date/Time   NA 144 12/31/2018 0451   NA 140 08/26/2018 1355   K 3.7 12/31/2018 0451   CL 110 12/31/2018 0451   CO2 27 12/31/2018 0451   GLUCOSE 157 (H) 12/31/2018 0451   BUN 11 12/31/2018 0451   BUN 14 08/26/2018 1355   CREATININE 1.25 (H) 12/31/2018 0451   CREATININE 1.20 10/29/2016 1236   CALCIUM 7.7 (L) 12/31/2018 0451   PROT 5.2 (L)  12/30/2018 0558   PROT 7.4 12/03/2018 1459   ALBUMIN 1.5 (L) 12/31/2018 0451   ALBUMIN 3.6 (L) 12/03/2018 1459   AST 25 12/30/2018 0558   ALT 18 12/30/2018 0558   ALKPHOS 27 (L) 12/30/2018 0558   BILITOT 1.0 12/30/2018 0558   BILITOT 0.3 12/03/2018 1459   GFRNONAA >60 12/31/2018 0451   GFRAA >60 12/31/2018 0451   Lipase     Component Value Date/Time   LIPASE 27 12/19/2018 2038       Studies/Results: No results found.  Anti-infectives: Anti-infectives (From admission, onward)   Start     Dose/Rate Route Frequency Ordered Stop   12/28/18 1400  fluconazole (DIFLUCAN) IVPB 400 mg     400 mg 100 mL/hr over 120 Minutes Intravenous Every 24 hours 12/28/18 1318     12/20/18 2200  piperacillin-tazobactam (ZOSYN) IVPB 3.375 g     3.375 g 12.5 mL/hr over 240 Minutes Intravenous Every 8 hours 12/20/18 1834     12/20/18 0645  piperacillin-tazobactam (ZOSYN) IVPB 3.375 g  Status:  Discontinued     3.375 g 100 mL/hr over 30 Minutes Intravenous Every 6 hours 12/20/18 0630 12/20/18 1833   12/19/18 2315  piperacillin-tazobactam (ZOSYN) IVPB 3.375 g     3.375 g 100 mL/hr over 30 Minutes Intravenous  Once 12/19/18 2302 12/20/18 0003  Assessment/Plan CHF EF 25-30% Atrial fibrillation Hx DVT/PE LV mural thrombus Gross anticoagulant Morbid obesityBMI 50.63 CKDcreatinine 1.81>>1.44>>1.29>>1.43>>1.54 Dehydration  POD#3 STC/ileostomy for left colon mass (path pending) and perforated right colon- [place] - TPN, clamping trials today - can d/c NGT this afternoon if doing well - path pending - Continue BID dressing changes - WOC consult - WBC 16.8, Tmax 102.6, continue IV abx - may need CT A/P in a few days given perforated colon   FEN: NPO, IVF; TPN; NGT clamping  VTE: heparin gtt ID: IV Zosyn 12/6>>, IV diflucan 12/15>>  Per patient request I called his daughter Coralee North today and talked to her.  Her number is [phone]  LOS: 11 days    Wells Guiles ,  [facility] 12/31/2018, 9:30 AM Please see Amion for pager number during day hours 7:00am-4:30pm

## 2019-01-01 DIAGNOSIS — K659 Peritonitis, unspecified: Secondary | ICD-10-CM

## 2019-01-01 LAB — CBC
HCT: 30 % — ABNORMAL LOW (ref 39.0–52.0)
Hemoglobin: 9.5 g/dL — ABNORMAL LOW (ref 13.0–17.0)
MCH: 29.3 pg (ref 26.0–34.0)
MCHC: 31.7 g/dL (ref 30.0–36.0)
MCV: 92.6 fL (ref 80.0–100.0)
Platelets: 121 10*3/uL — ABNORMAL LOW (ref 150–400)
RBC: 3.24 MIL/uL — ABNORMAL LOW (ref 4.22–5.81)
RDW: 14.6 % (ref 11.5–15.5)
WBC: 12.9 10*3/uL — ABNORMAL HIGH (ref 4.0–10.5)
nRBC: 0.2 % (ref 0.0–0.2)

## 2019-01-01 LAB — GLUCOSE, CAPILLARY
Glucose-Capillary: 132 mg/dL — ABNORMAL HIGH (ref 70–99)
Glucose-Capillary: 134 mg/dL — ABNORMAL HIGH (ref 70–99)
Glucose-Capillary: 156 mg/dL — ABNORMAL HIGH (ref 70–99)
Glucose-Capillary: 156 mg/dL — ABNORMAL HIGH (ref 70–99)
Glucose-Capillary: 158 mg/dL — ABNORMAL HIGH (ref 70–99)
Glucose-Capillary: 159 mg/dL — ABNORMAL HIGH (ref 70–99)

## 2019-01-01 LAB — BASIC METABOLIC PANEL
Anion gap: 9 (ref 5–15)
BUN: 17 mg/dL (ref 6–20)
CO2: 25 mmol/L (ref 22–32)
Calcium: 7.6 mg/dL — ABNORMAL LOW (ref 8.9–10.3)
Chloride: 108 mmol/L (ref 98–111)
Creatinine, Ser: 1.14 mg/dL (ref 0.61–1.24)
GFR calc Af Amer: >60 mL/min (ref >60)
GFR calc non Af Amer: >60 mL/min (ref >60)
Glucose, Bld: 202 mg/dL — ABNORMAL HIGH (ref 70–99)
Potassium: 3.5 mmol/L (ref 3.5–5.1)
Sodium: 142 mmol/L (ref 135–145)

## 2019-01-01 LAB — DIGOXIN LEVEL: Digoxin Level: 0.3 ng/mL — ABNORMAL LOW (ref 0.8–2.0)

## 2019-01-01 LAB — HEPARIN LEVEL (UNFRACTIONATED): Heparin Unfractionated: 0.39 IU/mL (ref 0.30–0.70)

## 2019-01-01 LAB — PHOSPHORUS: Phosphorus: 2.5 mg/dL (ref 2.5–4.6)

## 2019-01-01 LAB — MAGNESIUM: Magnesium: 1.9 mg/dL (ref 1.7–2.4)

## 2019-01-01 MED ORDER — ALBUMIN HUMAN 5 % IV SOLN
12.5000 g | Freq: Once | INTRAVENOUS | Status: AC
  Administered 2019-01-01: 12:00:00 12.5 g via INTRAVENOUS
  Filled 2019-01-01: qty 250

## 2019-01-01 MED ORDER — TRACE MINERALS CU-MN-SE-ZN 300-55-60-3000 MCG/ML IV SOLN
INTRAVENOUS | Status: AC
  Filled 2019-01-01: qty 985.6

## 2019-01-01 MED ORDER — CHLORHEXIDINE GLUCONATE 0.12 % MT SOLN
15.0000 mL | Freq: Two times a day (BID) | OROMUCOSAL | Status: DC
  Administered 2019-01-01 – 2019-01-24 (×41): 15 mL via OROMUCOSAL
  Filled 2019-01-01 (×42): qty 15

## 2019-01-01 MED ORDER — ORAL CARE MOUTH RINSE
15.0000 mL | Freq: Two times a day (BID) | OROMUCOSAL | Status: DC
  Administered 2019-01-01 – 2019-01-24 (×34): 15 mL via OROMUCOSAL

## 2019-01-01 NOTE — Progress Notes (Signed)
NAME:  Roberto [name], MRN:  [id number], DOB:  05-17-1965, LOS: 12 ADMISSION DATE:  12/19/2018, CONSULTATION DATE:  12/28/2018 REFERRING MD: Kirby Crigler, CHIEF COMPLAINT: Left colonic stricture of unknown etiology, perforated cecum   Information obtained  from medical record as patient is intubated and sedated  Brief History   Pt. With history of NICM, EF 25-30%, PSVT, PAF, chronic systolic HF with AICD, hx of DVT/PE/LV thrombus , on chronic anticoagulation ( bridged with heparin)  , was   admitted 12/20/2018 for acute diverticulitis. He was initially treated conservatively, but had no real improvement and was taken to the OR 12/28/2018.Pt was found to have a perforated cecum with leakage of stool throughout abdominal cavity. He was brought from the OR to NICU intubated and on pressors. PCCM have been asked to admit and manage care.  History of present illness   Pt. With history of NICM, EF 25-30%, PSVT, PAF, chronic systolic HF with AICD, hx of DVT/PE/LV thrombus , on chronic anticoagulation ( bridged with heparin)  , was   admitted 12/20/2018 for acute diverticulitis. He was initially treated conservatively, but had no real improvement and was taken to the OR 12/28/2018. Pt had Left colonic stricture of unknown etiology, and perforated cecum. Per surgery's note there was a mass in the mid descending colon. The  right colon  was ischemic and had perforated. There was visualization of leakage of a large amount of stool throughout his entire abdominal cavity.  In OR patient became hypotensive , required pressors  (Phenylephrine and Levo).  Surgery felt the patient was not ready for extubation , and considering co morbidities and possible septic insult   asked PCCM to admit to ICU manage care .  Past Medical History   Past Medical History:  Diagnosis Date  . AICD (automatic cardioverter/defibrillator) present 08/2018  . Arthritis   . Chronic combined systolic and diastolic CHF (congestive heart  failure) (HCC)   . Deep vein thrombosis (HCC)   . Diverticulitis   . Lupus anticoagulant positive   . Morbid obesity (HCC)   . Mural thrombus of heart    coumadin  . Nonischemic cardiomyopathy (HCC)    a) 12/17/11 echo: LVEF 25-30%, grade 3 diastolic dysfunction (c/w restriction), mod MR, mod LA/LV and mild RA dilatation; b) 12/18/11 cMRI: LVEF 38%, mod LV/mild RV dilatation, global HK, mild-mod RV sys dysfxn, no LV thrombus & patchy non-subendocardial delayed enhancement c/w infil dz or prior myocarditis; c. 07/2018 EF 25-30%.  . Paroxysmal SVT (supraventricular tachycardia) (HCC)   . Pulmonary embolism (HCC)    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    Significant Hospital Events   12/20/2018 Admission 12/28/2018 To OR >> perforated cecum with stool contamination to abdomen  Consults:  Surgery GI Cards  Procedures:  12/28/2018 Exploratory laparotomy Subtotal colectomy End ileostomy  Significant Diagnostic Tests:  12/14 CT Abdomen Pelvis Worsening distal large-bowel obstruction at the level of the descending colon correlating with an approximately 5 cm in length segment of irregular annular wall thickening with underlying colonic diverticulosis and mild pericolonic fat stranding. Imaging findings remain indeterminate for malignant versus diverticular descending colonic stricture. New diffuse small bowel dilatation with air-fluid levels. No free air or abscess. Chronic findings include: Mild cardiomegaly. Small hiatal hernia. Posterior liver masses, decreased in size since 2010, compatible with benign lesions.  12/27/2018 KUB Nasogastric tube tip and side port in stomach. Persistent bowel dilatation. No free air demonstrable  12/23/2018  Biopsy >> Colon COLON, DESCENDING, BIOPSY:  -  Benign colonic mucosa with mild reactive changes.  - No active inflammation.  - No dysplasia or malignancy  Micro Data:  12/20/2018  SARS Coronavirus 2 NEGATIVE NEGATIVE   12/20/2018 MRSA, PCR  NEGATIVE NEGATIVE  NEGATIVE   Staphylococcus aureus NEGATIVE NEGATIVE  NEGATIVE    Antimicrobials:  Zosyn 12/20/2018>> Diflucan 12/15>> Interim history/subjective:  Remains on pressors.  C/O being "bloated." He is not passing flatus. Has not been out of bed.  Objective   Blood pressure 111/65, pulse 88, temperature 98.7 F (37.1 C), temperature source Axillary, resp. rate (!) 26, height 5\' 7"  (1.702 m), weight (!) 145.9 kg, SpO2 90 %.        Intake/Output Summary (Last 24 hours) at 01/01/2019 1034 Last data filed at 01/01/2019 0700 Gross per 24 hour  Intake 3060.31 ml  Output 1680 ml  Net 1380.31 ml   Filed Weights   12/29/18 0500 12/30/18 0500 12/31/18 0500  Weight: (!) 142.7 kg (!) 147.2 kg (!) 145.9 kg   Physical Exam: General: Obese, well-developed, well nourished adult male lying in bed. NAD HENT: Normocephalic, PERRL. Moist mucus membranes Neck: No JVD. Trachea midline. CV: RRR. S1S2. No MRG. +2 distal pulses Lungs: BBS present, clear, FNL, symmetrical PYP:PJKDT, rounded.  hyperactive+BS x4. Soft, tenderness to incisional areas, generalized discomfort from bloating, ND. No masses, guarding or rigidity. Colostomy with brown liquid stool. JP drain serosanguineous fluid.  GU: Foley EXT: MAE well. Generalized edema Skin: PWD. In tact. No rashes or lesions Neuro: A&Ox3. CN II-XII in tact. No focal deficits Psych: Appropriate mood, insight and judgment for time and situation   Labs and imaging for past 48 hours reviewed in [facility] Problem list     Assessment & Plan:   # Septic shock 2/2 perforated cecum and contamination of abdominal cavity with stool s/p subtotal colectomy and end ileostomy 12/15 # Colon mass - Albumin now  - Wean levophed and vasopressin for goal MAP >/=65 - Continue zosyn, fluconazole - Continue rectal tylenol - Wound care, drain management per surgery. Plan reviewed   # Post-op fever-fever curve improving  - continue to  monitor fever curve - IS/OOB  - Continue abx as above  # Acute hypoxic respiratory failure - resolved. Possible post-op atelectasis from pain  On RA - Encourage IS, bronchial hygiene   # AKI-improving with continued good UOP  - Continue foley with strict I&Os for critical illness - Trend UOP/Cr  # Chronic systolic and diastolic heart failure (EF 25-30%) # S/p ICD # Atrial fibrillation  - Amiodarone gtt - Home digoxin - Continue heparin gtt   # Hx DVT and PE  Best practice:  Diet: NPO/ TPN Pain/Anxiety/Delirium protocol (if indicated): PRN morphine for pain VAP protocol (if indicated): not indicated  DVT prophylaxis: PAS hose, heparin gtt  GI prophylaxis: Protonix  Glucose control: CBG /  SSI Mobility: PT, OOB today  Code Status: Full Code Family Communication: Will update Disposition:continue ICU     The patient is critically ill with septic shock. He requires ongoing ICU for high complexity decision making, titration of high alert medications, ventilator management, titration of oxygen and interpretation of advanced monitoring.    I personally spent 35 minutes providing critical care services including personally reviewing test results, discussing care with nursing staff/other physicians and completing orders pertaining to this patient.  Time was exclusive to the patient and does not include time spent teaching or in procedures.  Voice recognition software was used in the production of  this record.  Errors in interpretation may have been inadvertently missed during review.  Karin Lieu, MSN, AGACNP  [facility]

## 2019-01-01 NOTE — Progress Notes (Signed)
ANTICOAGULATION CONSULT NOTE - Follow Up Consult  Pharmacy Consult for Heparin + Fluconazole Indication: hx VTE, LV mural thrombus, afib with intraabdominal infection  Allergies  Allergen Reactions  . Shrimp [Shellfish Allergy] Hives and Itching    Patient Measurements: Height: 5\' 7"  (170.2 cm) Weight: (!) 321 lb 10.4 oz (145.9 kg) IBW/kg (Calculated) : 66.1 Heparin Dosing Weight:  101.6 kg  Vital Signs: Temp: 99.2 F (37.3 C) (12/19 0400) Temp Source: Oral (12/19 0400) BP: 111/65 (12/19 0715) Pulse Rate: 88 (12/19 0715)  Labs: Recent Labs    12/30/18 0447 12/30/18 0558 12/30/18 1639 12/30/18 1831 12/31/18 0451 01/01/19 0409  HGB 10.0*  --   --  10.4* 10.0* 9.5*  HCT 32.9*  --   --  33.5* 32.5* 30.0*  PLT 109*  --   --  125* 122* 121*  APTT 32  --  78*  --   --   --   HEPARINUNFRC  --   --  0.36  --  0.36 0.39  CREATININE  --  1.35*  --   --  1.25* 1.14    Estimated Creatinine Clearance: 103.9 mL/min (by C-G formula based on SCr of 1.14 mg/dL).   Assessment: Anticoag: Heparin (Apixaban PTA) for hx VTE, LV mural thrombus, afib- Hgb 9, plts 121. HL 0.39 in goal.  ID: Zosyn D#13, Flucon D#5 for IAI - Tmax 99.5, WBC 12.9, SCr 1.25 dropping. No cultures  Zosyn 12/7>  Flucon 12/15>>  12/14 MRSA PCR: MRSA neg, SA neg 12/6 COVID: neg  Goal of Therapy:  Heparin level 0.3-0.7 units/ml Monitor platelets by anticoagulation protocol: Yes   Plan:  Heparin gtt 2300 units/hr. Daily HL and CBC Continue Fluconazole 400mg  IV daily Zosyn 3.375g IV q8 hrs  Alanda Slim, PharmD, [facility] Clinical Pharmacist Please see AMION for all Pharmacists' Contact Phone Numbers 01/01/2019, 8:18 AM  \

## 2019-01-01 NOTE — Progress Notes (Signed)
PHARMACY - TOTAL PARENTERAL NUTRITION CONSULT NOTE   Indication: massive bowel resection, prolonged NPO  Patient Measurements: Height: 5\' 7"  (170.2 cm) Weight: (!) 321 lb 10.4 oz (145.9 kg) IBW/kg (Calculated) : 66.1 TPN AdjBW (KG): 86.1 Body mass index is 50.38 kg/m.  Assessment:  47 yom presented to the hospital with abdominal pain found to be acute diverticulitis. He was treated conservatively but without improvement. He was taken to the OR 12/15 and found to have a perforated cecum with leakage of stool throughout the abdominal cavity. To start TPN for nutrition support today. He remains on the ventilator with vasopressors. Patient has been NPO or clear liquids since admission so he may be at risk for refeeding syndrome.   Glucose / Insulin: A1c 6.3, CBGs 150-200 requiring 16 units SSI last 24 hrs. Electrolytes: Mg/K+ low normal - will incr in TPN. Phos 2.5 Renal: SCr 1.14 down. UOP 0.5 LFTs / TGs: LFTs WNL, TG 67 Prealbumin / albumin: prealbumin 10.1, albumin 1.5 Intake / Output; MIVF: drain O/P 190 GI Imaging: 12/14 Abd xray: Persistent bowel dilatation. No free air demonstrable 12/14 CT abd: Worsening distal large-bowel obstruction at the level of the descending colon correlating with an approximately 5 cm in length segment of irregular annular wall thickening with underlying colonic diverticulosis and mild pericolonic fat stranding 12/9 Abd xray: Worsening gaseous distension of the proximal colon and now involving much of the small bowel Surgeries / Procedures:  12/15 Exlap, subtotal colectomy and end ileostomy 12/10 Flexible sigmoidoscopy  Central access: PICC 12/15 TPN start date: 12/16  Nutritional Goals (per RD recommendation on 12/17): KCal: 2200-2400, Protein: 130-150, Fluid: >/=1.8 L/day Goal TPN rate is 80 mL/hr (provides 147 g of protein and 1280 kcals per day)  Current Nutrition:  TPN   Plan:  Increase TPN to 45mL/hr at 1800 - provides 147g protein, 384g  dextrose and 1896 kcal Hold lipid emulsion for first 7 days for critically ill patients per ASPEN guidelines (Start date 12/23) Electrolytes in TPN: Incr K and Mag  Add standard MVI MWF and trace elements daily to TPN Continue Moderate q4h SSI and adjust as needed  Monitor TPN labs   Alanda Slim, PharmD, [facility] Clinical Pharmacist Please see AMION for all Pharmacists' Contact Phone Numbers 01/01/2019, 8:28 AM

## 2019-01-01 NOTE — Progress Notes (Signed)
  4 Days Post-Op  Subjective: Complains of feeling bloated. No vomiting  Objective: Vital signs in last 24 hours: Temp:  [98.3 F (36.8 C)-99.5 F (37.5 C)] 98.7 F (37.1 C) (12/19 0800) Pulse Rate:  [57-95] 88 (12/19 0715) Resp:  [18-45] 26 (12/19 0715) BP: (84-127)/(50-106) 111/65 (12/19 0715) SpO2:  [84 %-96 %] 90 % (12/19 0715) Last BM Date: 12/31/18  Intake/Output from previous day: 12/18 0701 - 12/19 0700 In: 3143.3 [I.V.:2512.7; IV Piggyback:630.7] Out: 2065 [Urine:1775; Drains:190; Stool:100] Intake/Output this shift: No intake/output data recorded.  General appearance: alert and cooperative Resp: clear to auscultation bilaterally Cardio: regular rate and rhythm GI: soft, distended. wound clean. ileostomy pink and productive  Lab Results:  Recent Labs    12/31/18 0451 01/01/19 0409  WBC 16.8* 12.9*  HGB 10.0* 9.5*  HCT 32.5* 30.0*  PLT 122* 121*   BMET Recent Labs    12/31/18 0451 01/01/19 0409  NA 144 142  K 3.7 3.5  CL 110 108  CO2 27 25  GLUCOSE 157* 202*  BUN 11 17  CREATININE 1.25* 1.14  CALCIUM 7.7* 7.6*   PT/INR No results for input(s): LABPROT, INR in the last 72 hours. ABG No results for input(s): PHART, HCO3 in the last 72 hours.  Invalid input(s): PCO2, PO2  Studies/Results: No results found.  Anti-infectives: Anti-infectives (From admission, onward)   Start     Dose/Rate Route Frequency Ordered Stop   12/28/18 1400  fluconazole (DIFLUCAN) IVPB 400 mg     400 mg 100 mL/hr over 120 Minutes Intravenous Every 24 hours 12/28/18 1318     12/20/18 2200  piperacillin-tazobactam (ZOSYN) IVPB 3.375 g     3.375 g 12.5 mL/hr over 240 Minutes Intravenous Every 8 hours 12/20/18 1834     12/20/18 0645  piperacillin-tazobactam (ZOSYN) IVPB 3.375 g  Status:  Discontinued     3.375 g 100 mL/hr over 30 Minutes Intravenous Every 6 hours 12/20/18 0630 12/20/18 1833   12/19/18 2315  piperacillin-tazobactam (ZOSYN) IVPB 3.375 g     3.375 g 100  mL/hr over 30 Minutes Intravenous  Once 12/19/18 2302 12/20/18 0003      Assessment/Plan: s/p Procedure(s): TOTAL COLECTOMY ILEOSTOMY Continue npo and bowel rest for now  CHF EF 25-30% Atrial fibrillation Hx DVT/PE LV mural thrombus Gross anticoagulant Morbid obesityBMI 50.63 CKDcreatinine 1.81>>1.44>>1.29>>1.43>>1.54 Dehydration  POD#3STC/ileostomy for left colon mass (path pending) and perforated right colon- [place] - TPN,clamping trials today - can d/c NGT this afternoon if doing well - path pending - Continue BID dressing changes - WOC consult - WBC down to 12.9. continue IV zosyn wean pressors as tolerated  FEN: NPO, IVF; TPN; NGT clamping  VTE: heparin gtt ID: IV Zosyn 12/6>>, IV diflucan 12/15>>  LOS: 12 days    Autumn Messing III 01/01/2019

## 2019-01-02 ENCOUNTER — Inpatient Hospital Stay (HOSPITAL_COMMUNITY): Payer: BC Managed Care – PPO

## 2019-01-02 LAB — BASIC METABOLIC PANEL
Anion gap: 8 (ref 5–15)
BUN: 16 mg/dL (ref 6–20)
CO2: 24 mmol/L (ref 22–32)
Calcium: 7.8 mg/dL — ABNORMAL LOW (ref 8.9–10.3)
Chloride: 112 mmol/L — ABNORMAL HIGH (ref 98–111)
Creatinine, Ser: 1.2 mg/dL (ref 0.61–1.24)
GFR calc Af Amer: >60 mL/min (ref >60)
GFR calc non Af Amer: >60 mL/min (ref >60)
Glucose, Bld: 191 mg/dL — ABNORMAL HIGH (ref 70–99)
Potassium: 3.7 mmol/L (ref 3.5–5.1)
Sodium: 144 mmol/L (ref 135–145)

## 2019-01-02 LAB — GLUCOSE, CAPILLARY
Glucose-Capillary: 148 mg/dL — ABNORMAL HIGH (ref 70–99)
Glucose-Capillary: 153 mg/dL — ABNORMAL HIGH (ref 70–99)
Glucose-Capillary: 190 mg/dL — ABNORMAL HIGH (ref 70–99)
Glucose-Capillary: 200 mg/dL — ABNORMAL HIGH (ref 70–99)
Glucose-Capillary: 216 mg/dL — ABNORMAL HIGH (ref 70–99)
Glucose-Capillary: 217 mg/dL — ABNORMAL HIGH (ref 70–99)

## 2019-01-02 LAB — CBC
HCT: 30.8 % — ABNORMAL LOW (ref 39.0–52.0)
Hemoglobin: 9.8 g/dL — ABNORMAL LOW (ref 13.0–17.0)
MCH: 29.2 pg (ref 26.0–34.0)
MCHC: 31.8 g/dL (ref 30.0–36.0)
MCV: 91.7 fL (ref 80.0–100.0)
Platelets: 144 10*3/uL — ABNORMAL LOW (ref 150–400)
RBC: 3.36 MIL/uL — ABNORMAL LOW (ref 4.22–5.81)
RDW: 14.7 % (ref 11.5–15.5)
WBC: 15.2 10*3/uL — ABNORMAL HIGH (ref 4.0–10.5)
nRBC: 0.5 % — ABNORMAL HIGH (ref 0.0–0.2)

## 2019-01-02 LAB — MAGNESIUM: Magnesium: 2 mg/dL (ref 1.7–2.4)

## 2019-01-02 LAB — HEPARIN LEVEL (UNFRACTIONATED): Heparin Unfractionated: 0.26 IU/mL — ABNORMAL LOW (ref 0.30–0.70)

## 2019-01-02 LAB — PHOSPHORUS: Phosphorus: 2.1 mg/dL — ABNORMAL LOW (ref 2.5–4.6)

## 2019-01-02 MED ORDER — ONDANSETRON HCL 4 MG/2ML IJ SOLN
4.0000 mg | Freq: Three times a day (TID) | INTRAMUSCULAR | Status: DC | PRN
  Administered 2019-01-02: 11:00:00 4 mg via INTRAVENOUS
  Administered 2019-01-06: 21:00:00 8 mg via INTRAVENOUS
  Filled 2019-01-02: qty 4
  Filled 2019-01-02: qty 2

## 2019-01-02 MED ORDER — ALUM & MAG HYDROXIDE-SIMETH 200-200-20 MG/5ML PO SUSP
30.0000 mL | ORAL | Status: DC | PRN
  Administered 2019-01-02: 02:00:00 30 mL via ORAL
  Filled 2019-01-02 (×2): qty 30

## 2019-01-02 MED ORDER — POTASSIUM PHOSPHATES 15 MMOLE/5ML IV SOLN
20.0000 mmol | Freq: Once | INTRAVENOUS | Status: AC
  Administered 2019-01-02: 09:00:00 20 mmol via INTRAVENOUS
  Filled 2019-01-02: qty 6.67

## 2019-01-02 MED ORDER — TRACE MINERALS CU-MN-SE-ZN 300-55-60-3000 MCG/ML IV SOLN
INTRAVENOUS | Status: AC
  Filled 2019-01-02: qty 985.6

## 2019-01-02 NOTE — Evaluation (Signed)
Physical Therapy Evaluation Patient Details Name: Roberto [name] MRN: [id number] DOB: 23-Dec-1965 Today's Date: 01/02/2019   History of Present Illness  Pt is a 53 y.o. M with history of NICM, EF 25-30%, PSVT, PAF, chronic systolic HF with AICD, DVT/PE/LV thrombus , on chronic anticoagulation who was admitted 12/20/2018 for acute diverticulitis. He was initially treated conservatively, but had no real improvement and was taken to the OR 12/28/2018.Pt was found to have a perforated cecum with leakage of stool throughout abdominal cavity. 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 He was brought from the OR to NICU intubated and on pressors. Extubated 12/16.  Clinical Impression  Pt admitted with above. Pt able to tolerate bed in chair position today (BP stable) and initiate bed level HEP program for upper and lower extremity strengthening. Pt presents with decreased functional mobility secondary to severe abdominal pain, obesity, and generalized weakness. Expect rehab progress will be slow given issues with pain control; recommending SNF at discharge currently. Will continue efforts at out of bed as tolerated.    Follow Up Recommendations SNF    Equipment Recommendations  Other (comment)(TBA)    Recommendations for Other Services       Precautions / Restrictions Precautions Precautions: Fall;Other (comment) Precaution Comments: colostomy, JP drain Restrictions Weight Bearing Restrictions: No      Mobility  Bed Mobility Overal bed mobility: Needs Assistance             General bed mobility comments: Bed placed in chair position; totalA for trunk lift off   Transfers                 General transfer comment: deferred due to pain  Ambulation/Gait                Stairs            Wheelchair Mobility    Modified Rankin (Stroke Patients Only)       Balance                                             Pertinent Vitals/Pain  Pain Assessment: Faces Faces Pain Scale: Hurts whole lot Pain Location: abdomen with movement; 0/10 pain at rest Pain Descriptors / Indicators: Grimacing;Guarding Pain Intervention(s): Limited activity within patient's tolerance;Monitored during session;Premedicated before session;Repositioned    Home Living Family/patient expects to be discharged to:: Private residence Living Arrangements: Children(daughter) Available Help at Discharge: Family Type of Home: Apartment Home Access: Level entry     Home Layout: One level        Prior Function Level of Independence: Independent         Comments: Independent, community ambulatory, works from home     Journalist, newspaper        Extremity/Trunk Assessment   Upper Extremity Assessment Upper Extremity Assessment: Overall WFL for tasks assessed    Lower Extremity Assessment Lower Extremity Assessment: Generalized weakness    Cervical / Trunk Assessment Cervical / Trunk Assessment: Other exceptions Cervical / Trunk Exceptions: increased body habitus  Communication   Communication: No difficulties  Cognition Arousal/Alertness: Awake/alert Behavior During Therapy: WFL for tasks assessed/performed Overall Cognitive Status: Within Functional Limits for tasks assessed  General Comments      Exercises General Exercises - Upper Extremity Shoulder Flexion: Both;10 reps;Supine Elbow Flexion: Both;10 reps;Supine General Exercises - Lower Extremity Ankle Circles/Pumps: Both;15 reps;Supine Quad Sets: Both;15 reps;Supine Gluteal Sets: Both;15 reps;Supine Short Arc Quad: Both;10 reps;Supine Heel Slides: Left;10 reps;Supine   Assessment/Plan    PT Assessment Patient needs continued PT services  PT Problem List Decreased strength;Decreased activity tolerance;Decreased balance;Decreased mobility;Obesity;Pain       PT Treatment Interventions Functional mobility  training;Therapeutic activities;Therapeutic exercise;Balance training;Gait training;DME instruction;Patient/family education;Wheelchair mobility training    PT Goals (Current goals can be found in the Care Plan section)  Acute Rehab PT Goals Patient Stated Goal: less pain PT Goal Formulation: With patient Time For Goal Achievement: 01/16/19 Potential to Achieve Goals: Good    Frequency Min 3X/week   Barriers to discharge        Co-evaluation               AM-PAC PT "6 Clicks" Mobility  Outcome Measure Help needed turning from your back to your side while in a flat bed without using bedrails?: Total Help needed moving from lying on your back to sitting on the side of a flat bed without using bedrails?: Total Help needed moving to and from a bed to a chair (including a wheelchair)?: Total Help needed standing up from a chair using your arms (e.g., wheelchair or bedside chair)?: Total Help needed to walk in hospital room?: Total Help needed climbing 3-5 steps with a railing? : Total 6 Click Score: 6    End of Session   Activity Tolerance: Patient limited by pain Patient left: in bed;with call bell/phone within reach Nurse Communication: Mobility status PT Visit Diagnosis: Other abnormalities of gait and mobility (R26.89);Pain;Muscle weakness (generalized) (M62.81) Pain - Right/Left: Right Pain - part of body: (abdomen)    Time: 5102-5852 PT Time Calculation (min) (ACUTE ONLY): 17 min   Charges:   PT Evaluation $PT Eval Moderate Complexity: 1 Mod          Laurina Bustle, [place], DPT Acute Rehabilitation Services Pager [phone] Office [phone]   Vanetta Mulders 01/02/2019, 3:29 PM

## 2019-01-02 NOTE — Progress Notes (Addendum)
PHARMACY - TOTAL PARENTERAL NUTRITION CONSULT NOTE   Indication: massive bowel resection, prolonged NPO  Patient Measurements: Height: 5\' 7"  (170.2 cm) Weight: (!) 321 lb 10.4 oz (145.9 kg) IBW/kg (Calculated) : 66.1 TPN AdjBW (KG): 86.1 Body mass index is 50.38 kg/m.  Assessment:  55 yom presented to the hospital with abdominal pain found to be acute diverticulitis. He was treated conservatively but without improvement. He was taken to the OR 12/15 and found to have a perforated cecum with leakage of stool throughout the abdominal cavity. To start TPN for nutrition support today. He remains on the ventilator with vasopressors. Patient has been NPO or clear liquids since admission so he may be at risk for refeeding syndrome.   Glucose / Insulin: A1c 6.3, CBGs 130-190 requiring 15 units SSI last 24 hrs. Electrolytes: Mg/K+ normal. Phos 2.1 (will replace) Renal: SCr 1.2. UOP 1 ml/kg/hr LFTs / TGs: LFTs WNL, TG 67 Prealbumin / albumin: prealbumin 10.1, albumin 1.5 Intake / Output; MIVF: drain O/P 185 GI Imaging: 12/14 Abd xray: Persistent bowel dilatation. No free air demonstrable 12/14 CT abd: Worsening distal large-bowel obstruction at the level of the descending colon correlating with an approximately 5 cm in length segment of irregular annular wall thickening with underlying colonic diverticulosis and mild pericolonic fat stranding 12/9 Abd xray: Worsening gaseous distension of the proximal colon and now involving much of the small bowel Surgeries / Procedures:  12/15 Exlap, subtotal colectomy and end ileostomy 12/10 Flexible sigmoidoscopy  Central access: PICC 12/15 TPN start date: 12/16  Nutritional Goals (per RD recommendation on 12/17): KCal: 2200-2400, Protein: 130-150, Fluid: >/=1.8 L/day Goal TPN rate is 80 mL/hr (provides 147 g of protein and 1280 kcals per day)  Current Nutrition:  TPN  12/19 CLD started  Plan:  Continue TPN at 64mL/hr - provides 147g protein, 384g  dextrose and 1896 kcal Hold lipid emulsion for first 7 days for critically ill patients per ASPEN guidelines (Start date 12/23) Electrolytes in TPN: Incr Phos, continue incr K and Mag  Replace with KPhos 20 mmol IV x 1 Add standard MVI MWF and trace elements daily to TPN Continue Moderate q4h SSI and adjust as needed  Monitor oral intake Monitor TPN labs   Alanda Slim, PharmD, [facility] Clinical Pharmacist Please see AMION for all Pharmacists' Contact Phone Numbers 01/02/2019, 7:14 AM

## 2019-01-02 NOTE — Progress Notes (Signed)
NAME:  Roberto [name], MRN:  [id number], DOB:  March 10, 1965, LOS: 56 ADMISSION DATE:  12/19/2018, CONSULTATION DATE:  12/28/2018 REFERRING MD: Hollice Gong, CHIEF COMPLAINT: Left colonic stricture of unknown etiology, perforated cecum   Information obtained  from medical record as patient is intubated and sedated  Brief History   Pt. With history of NICM, EF 25-30%, PSVT, PAF, chronic systolic HF with AICD, hx of DVT/PE/LV thrombus , on chronic anticoagulation ( bridged with heparin)  , was   admitted 12/20/2018 for acute diverticulitis. He was initially treated conservatively, but had no real improvement and was taken to the OR 12/28/2018.Pt was found to have a perforated cecum with leakage of stool throughout abdominal cavity. He was brought from the OR to NICU intubated and on pressors. PCCM have been asked to admit and manage care.  History of present illness   Pt. With history of NICM, EF 25-30%, PSVT, PAF, chronic systolic HF with AICD, hx of DVT/PE/LV thrombus , on chronic anticoagulation ( bridged with heparin)  , was   admitted 12/20/2018 for acute diverticulitis. He was initially treated conservatively, but had no real improvement and was taken to the OR 12/28/2018. Pt had Left colonic stricture of unknown etiology, and perforated cecum. Per surgery's note there was a mass in the mid descending colon. The  right colon  was ischemic and had perforated. There was visualization of leakage of a large amount of stool throughout his entire abdominal cavity.  In OR patient became hypotensive , required pressors  (Phenylephrine and Levo).  Surgery felt the patient was not ready for extubation , and considering co morbidities and possible septic insult   asked PCCM to admit to ICU manage care .  Past Medical History   Past Medical History:  Diagnosis Date  . AICD (automatic cardioverter/defibrillator) present 08/2018  . Arthritis   . Chronic combined systolic and diastolic CHF (congestive heart  failure) ([place])   . Deep vein thrombosis ([place])   . Diverticulitis   . Lupus anticoagulant positive   . Morbid obesity ([place])   . Mural thrombus of heart    coumadin  . Nonischemic cardiomyopathy ([place])    a) 12/17/11 echo: LVEF 76-72%, grade 3 diastolic dysfunction (c/w restriction), mod MR, mod LA/LV and mild RA dilatation; b) 12/18/11 cMRI: LVEF 38%, mod LV/mild RV dilatation, global HK, mild-mod RV sys dysfxn, no LV thrombus & patchy non-subendocardial delayed enhancement c/w infil dz or prior myocarditis; c. 07/2018 EF 25-30%.  . Paroxysmal SVT (supraventricular tachycardia) ([place])   . Pulmonary embolism ([place])    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    [place] Hospital Events   12/20/2018 Admission 12/28/2018 To OR >> perforated cecum with stool contamination to abdomen  Consults:  Surgery GI Cards  Procedures:  12/28/2018 Exploratory laparotomy Subtotal colectomy End ileostomy  Significant Diagnostic Tests:  12/14 CT Abdomen Pelvis Worsening distal large-bowel obstruction at the level of the descending colon correlating with an approximately 5 cm in length segment of irregular annular wall thickening with underlying colonic diverticulosis and mild pericolonic fat stranding. Imaging findings remain indeterminate for malignant versus diverticular descending colonic stricture. New diffuse small bowel dilatation with air-fluid levels. No free air or abscess. Chronic findings include: Mild cardiomegaly. Small hiatal hernia. Posterior liver masses, decreased in size since 2010, compatible with benign lesions.  12/27/2018 KUB Nasogastric tube tip and side port in stomach. Persistent bowel dilatation. No free air demonstrable  12/23/2018  Biopsy >> Colon COLON, DESCENDING, BIOPSY:  -  Benign colonic mucosa with mild reactive changes.  - No active inflammation.  - No dysplasia or malignancy  Micro Data:  12/20/2018  SARS Coronavirus 2 NEGATIVE NEGATIVE   12/20/2018 MRSA, PCR  NEGATIVE NEGATIVE  NEGATIVE   Staphylococcus aureus NEGATIVE NEGATIVE  NEGATIVE    Antimicrobials:  Zosyn 12/20/2018>> Diflucan 12/15>> Interim history/subjective:  Off pressors. Passing some flatus. Has not been out of bed.  Objective   Blood pressure 137/68, pulse 94, temperature 99.9 F (37.7 C), temperature source Oral, resp. rate (!) 28, height 5\' 7"  (1.702 m), weight (!) 145.9 kg, SpO2 94 %.        Intake/Output Summary (Last 24 hours) at 01/02/2019 1022 Last data filed at 01/02/2019 0900 Gross per 24 hour  Intake 1909.22 ml  Output 4025 ml  Net -2115.78 ml   Filed Weights   12/29/18 0500 12/30/18 0500 12/31/18 0500  Weight: (!) 142.7 kg (!) 147.2 kg (!) 145.9 kg   Physical Exam: General: Obese, well-developed, well nourished adult male lying in bed. NAD HENT: Normocephalic, PERRL. Moist mucus membranes Neck: No JVD. Trachea midline. CV: RRR. S1S2. No MRG. +2 distal pulses Lungs: BBS present, clear, FNL, symmetrical 01/02/19, rounded.  hypoactive+BS x4. Soft, tenderness to incisional areas, generalized discomfort from bloating, ND. No masses, guarding or rigidity. Colostomy with brown liquid stool. JP drain serosanguineous fluid.  GU: Foley EXT: MAE well. Generalized edema Skin: PWD. In tact. No rashes or lesions Neuro: A&Ox3. CN II-XII in tact. No focal deficits Psych: Appropriate mood, insight and judgment for time and situation   Labs and imaging for past 48 hours reviewed in EMR   Resolved Hospital Problem list   Septic shock  Assessment & Plan:   # Septic shock 2/2 perforated cecum and contamination of abdominal cavity with stool s/p subtotal colectomy and end ileostomy 12/15-off pressors. Shock resolved. Low grade temp, increased leukocytosis-may be d/t atelectasis-see below  # Colon mass - Continue zosyn, fluconazole - Continue rectal tylenol - Wound care, drain management per surgery. Plan reviewed - KUB   # Post-op fever - continue to monitor fever  curve - IS/OOB/Pt/OT  - Continue abx as above  # Acute hypoxic respiratory failure - resolved. Possible post-op atelectasis from pain  On RA - Encourage IS, bronchial hygiene   # AKI-improving with continued good UOP  - Discontinue foley  -continue strict I&Os  - Trend UOP/Cr  # Chronic systolic and diastolic heart failure (EF 25-30%) # S/p ICD # Atrial fibrillation  - Amiodarone gtt complete  -IV digoxin - Continue heparin gtt   # Hx DVT and PE -heparin gtt per pharmacy with monitoring   Anemia including acute blood loss, nutritional and critical illness. No SnSx bleeding at present. Hemodynamically stable. -monitor -transfuse for Hgb <7   Best practice:  Diet:TPN, CLD per surgery  Pain/Anxiety/Delirium protocol (if indicated): PRN morphine for pain VAP protocol (if indicated): not indicated  DVT prophylaxis: PAS hose, heparin gtt  GI prophylaxis: Protonix  Glucose control: CBG /  SSI Mobility: PT, OOB today  Code Status: Full Code Family Communication: Will update Disposition:can likely transfer to PCU    Voice recognition software was used in the production of this record.  Errors in interpretation may have been inadvertently missed during review.  12-28-1987, MSN, AGACNP  [facility]

## 2019-01-02 NOTE — Progress Notes (Signed)
5 Days Post-Op   Subjective/Chief Complaint:Complains of feeling bloated. No vomiting    Objective: Vital signs in last 24 hours: Temp:  [99.3 F (37.4 C)-101.2 F (38.4 C)] 99.9 F (37.7 C) (12/20 0400) Pulse Rate:  [41-102] 94 (12/20 0900) Resp:  [13-41] 28 (12/20 0900) BP: (108-140)/(53-86) 137/68 (12/20 0900) SpO2:  [91 %-98 %] 94 % (12/20 0900) Last BM Date: 12/31/18  Intake/Output from previous day: 12/19 0701 - 12/20 0700 In: 1909.2 [I.V.:1509.2; IV Piggyback:400] Out: 1749 [Urine:3550; Drains:185; Stool:100] Intake/Output this shift: Total I/O In: -  Out: 190 [Drains:90; Stool:100]   General appearance: alert and cooperative Resp: clear to auscultation bilaterally Cardio: regular rate and rhythm GI: soft, distended. wound clean. ileostomy pink and productive Lab Results:  Recent Labs    01/01/19 0409 01/02/19 0100  WBC 12.9* 15.2*  HGB 9.5* 9.8*  HCT 30.0* 30.8*  PLT 121* 144*   BMET Recent Labs    01/01/19 0409 01/02/19 0100  NA 142 144  K 3.5 3.7  CL 108 112*  CO2 25 24  GLUCOSE 202* 191*  BUN 17 16  CREATININE 1.14 1.20  CALCIUM 7.6* 7.8*   PT/INR No results for input(s): LABPROT, INR in the last 72 hours. ABG No results for input(s): PHART, HCO3 in the last 72 hours.  Invalid input(s): PCO2, PO2  Studies/Results: No results found.  Anti-infectives: Anti-infectives (From admission, onward)   Start     Dose/Rate Route Frequency Ordered Stop   12/28/18 1400  fluconazole (DIFLUCAN) IVPB 400 mg     400 mg 100 mL/hr over 120 Minutes Intravenous Every 24 hours 12/28/18 1318     12/20/18 2200  piperacillin-tazobactam (ZOSYN) IVPB 3.375 g     3.375 g 12.5 mL/hr over 240 Minutes Intravenous Every 8 hours 12/20/18 1834     12/20/18 0645  piperacillin-tazobactam (ZOSYN) IVPB 3.375 g  Status:  Discontinued     3.375 g 100 mL/hr over 30 Minutes Intravenous Every 6 hours 12/20/18 0630 12/20/18 1833   12/19/18 2315  piperacillin-tazobactam  (ZOSYN) IVPB 3.375 g     3.375 g 100 mL/hr over 30 Minutes Intravenous  Once 12/19/18 2302 12/20/18 0003      Assessment/Plan: s/p Procedure(s): TOTAL COLECTOMY (N/A) ILEOSTOMY (N/A) POD#4STC/ileostomy for left colon mass (path pending) and perforated right colon- [place] - TPN, clears  - path pending -Continue BIDdressing changes -WOCconsult - WBC up  to 15,200 . continue IV zosyn pressors off - WILL NEED EXTENSIVE PT/OT  FEN: NPO, IVF; TPN;check KUB VTE: heparin gtt ID: IV Zosyn 12/6>>, IV diflucan 12/15  LOS: 13 days    Marcello Moores A Montrae Braithwaite 01/02/2019

## 2019-01-02 NOTE — Progress Notes (Signed)
ANTICOAGULATION CONSULT NOTE - Follow Up Consult  Pharmacy Consult for Heparin  Indication: hx VTE, LV mural thrombus, afib   Allergies  Allergen Reactions  . Shrimp [Shellfish Allergy] Hives and Itching    Patient Measurements: Height: 5\' 7"  (170.2 cm) Weight: (!) 321 lb 10.4 oz (145.9 kg) IBW/kg (Calculated) : 66.1 Heparin Dosing Weight:  101.6 kg  Vital Signs: Temp: 99.9 F (37.7 C) (12/20 0400) Temp Source: Oral (12/20 0400)  Labs: Recent Labs    12/30/18 1639 12/31/18 0451 01/01/19 0409 01/02/19 0100 01/02/19 0556  HGB  --  10.0* 9.5* 9.8*  --   HCT  --  32.5* 30.0* 30.8*  --   PLT  --  122* 121* 144*  --   APTT 78*  --   --   --   --   HEPARINUNFRC 0.36 0.36 0.39  --  0.26*  CREATININE  --  1.25* 1.14 1.20  --     Estimated Creatinine Clearance: 98.7 mL/min (by C-G formula based on SCr of 1.2 mg/dL).   Assessment: Anticoag: Heparin (Apixaban PTA) for hx VTE, LV mural thrombus, afib- Hgb 9.8, plts 144. HL 0.26 slightly below goal.  Goal of Therapy:  Heparin level 0.3-0.7 units/ml Monitor platelets by anticoagulation protocol: Yes   Plan:  Increase Heparin gtt to 2400 units/hr. Daily HL and CBC  Alanda Slim, PharmD, [facility] Clinical Pharmacist Please see AMION for all Pharmacists' Contact Phone Numbers 01/02/2019, 7:02 AM

## 2019-01-02 NOTE — Progress Notes (Signed)
[facility] Progress Note Patient Name: Roberto [name] DOB: Jul 17, 1965 MRN: [id number]   Date of Service  01/02/2019  HPI/Events of Note  Patient c/o indigestion.   eICU Interventions  Will order: 1. Mylanta 30 mL PO Q 4 hours PRN heartburn.     Intervention Category Major Interventions: Other:  Lahna Nath Cornelia Copa 01/02/2019, 1:25 AM

## 2019-01-03 ENCOUNTER — Inpatient Hospital Stay (HOSPITAL_COMMUNITY): Payer: BC Managed Care – PPO

## 2019-01-03 LAB — GLUCOSE, CAPILLARY
Glucose-Capillary: 157 mg/dL — ABNORMAL HIGH (ref 70–99)
Glucose-Capillary: 184 mg/dL — ABNORMAL HIGH (ref 70–99)
Glucose-Capillary: 188 mg/dL — ABNORMAL HIGH (ref 70–99)
Glucose-Capillary: 197 mg/dL — ABNORMAL HIGH (ref 70–99)
Glucose-Capillary: 212 mg/dL — ABNORMAL HIGH (ref 70–99)
Glucose-Capillary: 221 mg/dL — ABNORMAL HIGH (ref 70–99)

## 2019-01-03 LAB — CBC
HCT: 30.2 % — ABNORMAL LOW (ref 39.0–52.0)
Hemoglobin: 9.5 g/dL — ABNORMAL LOW (ref 13.0–17.0)
MCH: 29.3 pg (ref 26.0–34.0)
MCHC: 31.5 g/dL (ref 30.0–36.0)
MCV: 93.2 fL (ref 80.0–100.0)
Platelets: 156 10*3/uL (ref 150–400)
RBC: 3.24 MIL/uL — ABNORMAL LOW (ref 4.22–5.81)
RDW: 15.3 % (ref 11.5–15.5)
WBC: 20.3 10*3/uL — ABNORMAL HIGH (ref 4.0–10.5)
nRBC: 0.3 % — ABNORMAL HIGH (ref 0.0–0.2)

## 2019-01-03 LAB — DIFFERENTIAL
Abs Immature Granulocytes: 1.69 10*3/uL — ABNORMAL HIGH (ref 0.00–0.07)
Basophils Absolute: 0.2 10*3/uL — ABNORMAL HIGH (ref 0.0–0.1)
Basophils Relative: 1 %
Eosinophils Absolute: 0.4 10*3/uL (ref 0.0–0.5)
Eosinophils Relative: 2 %
Immature Granulocytes: 8 %
Lymphocytes Relative: 10 %
Lymphs Abs: 2 10*3/uL (ref 0.7–4.0)
Monocytes Absolute: 2 10*3/uL — ABNORMAL HIGH (ref 0.1–1.0)
Monocytes Relative: 10 %
Neutro Abs: 13.9 10*3/uL — ABNORMAL HIGH (ref 1.7–7.7)
Neutrophils Relative %: 69 %

## 2019-01-03 LAB — COMPREHENSIVE METABOLIC PANEL
ALT: 16 U/L (ref 0–44)
AST: 26 U/L (ref 15–41)
Albumin: 1.2 g/dL — ABNORMAL LOW (ref 3.5–5.0)
Alkaline Phosphatase: 27 U/L — ABNORMAL LOW (ref 38–126)
Anion gap: 8 (ref 5–15)
BUN: 17 mg/dL (ref 6–20)
CO2: 22 mmol/L (ref 22–32)
Calcium: 7.6 mg/dL — ABNORMAL LOW (ref 8.9–10.3)
Chloride: 113 mmol/L — ABNORMAL HIGH (ref 98–111)
Creatinine, Ser: 1.23 mg/dL (ref 0.61–1.24)
GFR calc Af Amer: >60 mL/min (ref >60)
GFR calc non Af Amer: >60 mL/min (ref >60)
Glucose, Bld: 211 mg/dL — ABNORMAL HIGH (ref 70–99)
Potassium: 4.1 mmol/L (ref 3.5–5.1)
Sodium: 143 mmol/L (ref 135–145)
Total Bilirubin: 0.5 mg/dL (ref 0.3–1.2)
Total Protein: 4.9 g/dL — ABNORMAL LOW (ref 6.5–8.1)

## 2019-01-03 LAB — TRIGLYCERIDES: Triglycerides: 101 mg/dL (ref <150)

## 2019-01-03 LAB — PREALBUMIN: Prealbumin: 6.1 mg/dL — ABNORMAL LOW (ref 18–38)

## 2019-01-03 LAB — HEPARIN LEVEL (UNFRACTIONATED)
Heparin Unfractionated: <0.1 IU/mL — ABNORMAL LOW (ref 0.30–0.70)
Heparin Unfractionated: 0.29 IU/mL — ABNORMAL LOW (ref 0.30–0.70)

## 2019-01-03 LAB — PHOSPHORUS: Phosphorus: 3.2 mg/dL (ref 2.5–4.6)

## 2019-01-03 LAB — DIGOXIN LEVEL: Digoxin Level: 0.5 ng/mL — ABNORMAL LOW (ref 0.8–2.0)

## 2019-01-03 LAB — MAGNESIUM: Magnesium: 2.1 mg/dL (ref 1.7–2.4)

## 2019-01-03 MED ORDER — INSULIN ASPART 100 UNIT/ML ~~LOC~~ SOLN
0.0000 [IU] | SUBCUTANEOUS | Status: DC
  Administered 2019-01-03 (×2): 4 [IU] via SUBCUTANEOUS
  Administered 2019-01-03: 3 [IU] via SUBCUTANEOUS
  Administered 2019-01-04: 04:00:00 4 [IU] via SUBCUTANEOUS
  Administered 2019-01-04 (×2): 7 [IU] via SUBCUTANEOUS
  Administered 2019-01-04 (×2): 4 [IU] via SUBCUTANEOUS
  Administered 2019-01-05: 3 [IU] via SUBCUTANEOUS
  Administered 2019-01-05: 4 [IU] via SUBCUTANEOUS
  Administered 2019-01-05 (×2): 11 [IU] via SUBCUTANEOUS
  Administered 2019-01-05: 7 [IU] via SUBCUTANEOUS
  Administered 2019-01-05 – 2019-01-06 (×2): 4 [IU] via SUBCUTANEOUS
  Administered 2019-01-06 – 2019-01-07 (×5): 3 [IU] via SUBCUTANEOUS
  Administered 2019-01-07 (×2): 4 [IU] via SUBCUTANEOUS
  Administered 2019-01-07: 14:00:00 7 [IU] via SUBCUTANEOUS
  Administered 2019-01-07 – 2019-01-08 (×2): 4 [IU] via SUBCUTANEOUS
  Administered 2019-01-08: 13:00:00 7 [IU] via SUBCUTANEOUS
  Administered 2019-01-08 – 2019-01-09 (×6): 4 [IU] via SUBCUTANEOUS
  Administered 2019-01-09: 09:00:00 3 [IU] via SUBCUTANEOUS
  Administered 2019-01-09: 13:00:00 7 [IU] via SUBCUTANEOUS
  Administered 2019-01-09 – 2019-01-10 (×3): 4 [IU] via SUBCUTANEOUS
  Administered 2019-01-10: 3 [IU] via SUBCUTANEOUS
  Administered 2019-01-10 (×4): 4 [IU] via SUBCUTANEOUS
  Administered 2019-01-11 (×2): 3 [IU] via SUBCUTANEOUS
  Administered 2019-01-11 (×3): 4 [IU] via SUBCUTANEOUS
  Administered 2019-01-12: 3 [IU] via SUBCUTANEOUS
  Administered 2019-01-12: 4 [IU] via SUBCUTANEOUS
  Administered 2019-01-12: 3 [IU] via SUBCUTANEOUS

## 2019-01-03 MED ORDER — IOHEXOL 300 MG/ML  SOLN
100.0000 mL | Freq: Once | INTRAMUSCULAR | Status: AC | PRN
  Administered 2019-01-03: 100 mL via INTRAVENOUS

## 2019-01-03 MED ORDER — TRACE MINERALS CU-MN-SE-ZN 300-55-60-3000 MCG/ML IV SOLN
INTRAVENOUS | Status: AC
  Filled 2019-01-03: qty 985.6

## 2019-01-03 NOTE — Progress Notes (Signed)
[facility] for Heparin  Indication: hx VTE, LV mural thrombus, afib   Allergies  Allergen Reactions  . Shrimp [Shellfish Allergy] Hives and Itching    Patient Measurements: Height: 5\' 7"  (170.2 cm) Weight: (!) 321 lb 10.4 oz (145.9 kg) IBW/kg (Calculated) : 66.1 Heparin Dosing Weight:  101.6 kg  Vital Signs: Temp: 98.6 F (37 C) (12/21 1200) Temp Source: Oral (12/21 1200) BP: 115/69 (12/21 1000) Pulse Rate: 80 (12/21 1000)  Labs: Recent Labs    01/01/19 0409 01/02/19 0100 01/02/19 0556 01/03/19 0521 01/03/19 1400  HGB 9.5* 9.8*  --  9.5*  --   HCT 30.0* 30.8*  --  30.2*  --   PLT 121* 144*  --  156  --   HEPARINUNFRC 0.39  --  0.26* <0.10* 0.29*  CREATININE 1.14 1.20  --  1.23  --     Estimated Creatinine Clearance: 96.3 mL/min (by C-G formula based on SCr of 1.23 mg/dL).   Assessment: 53 y.o. male with h/o Afib and LV thrombus, Eliquis on hold, for IV heparin.  Heparin level is just below goal at 0.29 on 2600 units/hr. No bleeding noted, Hgb 9.5 and stable, platelets are normal. Heparin infusing in left wrist, level drawn from PICC so should be accurate.  Goal of Therapy:  Heparin level 0.3-0.7 units/ml Monitor platelets by anticoagulation protocol: Yes   Plan:  Increase heparin drip to 2750 units/hr 6 hr heparin level Daily heparin level and CBC Monitor for s/sx of bleeding  Thank you for involving pharmacy in this patient's care.  Renold Genta, PharmD, BCPS Clinical Pharmacist Clinical phone for 01/03/2019 until 3p is [phone] 01/03/2019 2:53 PM  **Pharmacist phone directory can be found on [email or web address] listed under [place]**

## 2019-01-03 NOTE — Consult Note (Addendum)
[facility] Nurse ostomy follow up Stoma type/location: RLQ ileostomy Stomal assessment/size: 1 3/4 inches, red, moist, flush with skin level, mod amt liquid brown stool Peristomal assessment: intact Treatment options for stomal/peristomal skin: Barrier ring applied to attempt to maintain a seal.  Used one piece flat pouch but ordered one piece flexible convex pouches to the room for use next time. Reviewed pouching routines.  Ostomy pouching: 1pc. Flat cut to fit with barrier ring.  Demonstrated pouch change to patient and he watched and asked appropriate questions.  He will require further teaching sessions.  Pt plans to discharge to rehab setting, according to progress notes. Enrolled in Secure Start Discharge program: Yes Julien Girt MSN, RN, [place]a [place], [place], [place]

## 2019-01-03 NOTE — Progress Notes (Signed)
PROGRESS NOTE  Roberto [name] VHQ:[id number] DOB: Jul 28, 1965 DOA: 12/19/2018 PCP: Ronnald NianLalonde, [name], MD  HPI/Recap of past 24 hours: Pt. With history of NICM, EF 25-30%, PSVT, PAF, chronic systolic HF with AICD, hx of DVT/PE/LV thrombus , on chronic anticoagulation ( bridged with heparin), was admitted 12/20/2018 for acute diverticulitis. He was initially treated conservatively, but had no real improvement and was taken to the OR 12/28/2018. Pt had Left colonic stricture of unknown etiology, and perforated cecum. Per surgery's note there was a mass in the mid descending colon. The right colon  was ischemic and had perforated. There was visualization of leakage of a large amount of stool throughout his entire abdominal cavity.  In OR patient became hypotensive, required pressors  (Phenylephrine and Levo).  Surgery felt the patient was not ready for extubation, and considering co morbidities and possible septic insult asked PCCM to admit to ICU manage care.  [facility] now off pressors.  01/03/19: Seen and examined.  Denies any significant abdominal pain.  Stool in colostomy bag.  No nausea.  No dyspnea.  Not on oxygen at baseline.  Assessment/Plan: Principal Problem:   Acute diverticulitis Active Problems:   Obesity, morbid (HCC)   Nonischemic cardiomyopathy (HCC)   Paroxysmal SVT (supraventricular tachycardia) (HCC)   Chronic systolic heart failure (HCC)   Chronic pain of right knee   History of DVT (deep vein thrombosis)   PAF (paroxysmal atrial fibrillation) (HCC)   Pre-operative cardiovascular examination   Bowel obstruction (HCC)   Respiratory failure (HCC)   Septic shock secondary to perforated cecum and contamination of abdominal cavity with stool status post subtotal colectomy and ileostomy Off pressors Wound care and drain management per surgery Continue Zosyn and fluconazole Worsening leukocytosis Monitor WBC and fever curve.  POD #6 post  subtotal colectomy/ileostomy for left colon mass On TPN Tolerating clear liquids Pathology is pending Continue twice daily dressing changes as recommended by surgery Wound care specialist following   Acute hypoxic respiratory failure possibly secondary to postop atelectasis Normal oxygen supplementation at baseline Continue to maintain O2 saturation greater than 92%  Chronic combined systolic and diastolic CHF Last 2D echo LVEF 25 to 30% Status post AICD Strict I's and O's and daily weight  Paroxysmal A. fib On digoxin and heparin drip Cardiology following.  History of DVT/PE/LV thrombus Continue heparin drip  Significant Hospital Events   12/20/2018 Admission 12/28/2018 To OR >> perforated cecum with stool contamination to abdomen  Consults:  Surgery GI Cards  Procedures:  12/28/2018 Exploratory laparotomy Subtotal colectomy End ileostomy  Procedures:  12/28/2018 Exploratory laparotomy Subtotal colectomy End ileostomy  Significant Diagnostic Tests:  12/14 CT Abdomen Pelvis Worsening distal large-bowel obstruction at the level of the descending colon correlating with an approximately 5 cm in length segment of irregular annular wall thickening with underlying colonic diverticulosis and mild pericolonic fat stranding. Imaging findings remain indeterminate for malignant versus diverticular descending colonic stricture. New diffuse small bowel dilatation with air-fluid levels. No free air or abscess. Chronic findings include: Mild cardiomegaly. Small hiatal hernia. Posterior liver masses, decreased in size since 2010, compatible with benign lesions.  12/27/2018 KUB Nasogastric tube tip and side port in stomach. Persistent bowel dilatation. No free air demonstrable  12/23/2018  Biopsy >> Colon COLON, DESCENDING, BIOPSY:  - Benign colonic mucosa with mild reactive changes.  - No active inflammation.  - No dysplasia or malignancy  Micro Data:  12/20/2018    SARS Coronavirus 2 NEGATIVE NEGATIVE  12/20/2018 MRSA, PCR NEGATIVE NEGATIVE  NEGATIVE   Staphylococcus aureus NEGATIVE NEGATIVE  NEGATIVE    Antimicrobials:  Zosyn 12/20/2018>> Diflucan 12/15>>     Objective: Vitals:   01/03/19 0900 01/03/19 0923 01/03/19 1000 01/03/19 1200  BP: 109/69  115/69   Pulse: 84 87 80   Resp: (!) 27 (!) 23 (!) 31   Temp:    98.6 F (37 C)  TempSrc:    Oral  SpO2: 96% 95% 96%   Weight:      Height:        Intake/Output Summary (Last 24 hours) at 01/03/2019 1429 Last data filed at 01/03/2019 1000 Gross per 24 hour  Intake 4692.67 ml  Output 4040 ml  Net 652.67 ml   Filed Weights   12/29/18 0500 12/30/18 0500 12/31/18 0500  Weight: (!) 142.7 kg (!) 147.2 kg (!) 145.9 kg    Exam:  . General: 53 y.o. year-old male well developed well nourished in no acute distress.  Alert and oriented x3. . Cardiovascular: Regular rate and rhythm with no rubs or gallops.  No thyromegaly or JVD noted.   [name] Respiratory: Clear to auscultation with no wheezes or rales. Good inspiratory effort. . Abdomen: Mildly distended.  Right colostomy bag in place with stool.  .  Musculoskeletal: Trace lower extremity edema.  Bilaterally. [name] Psychiatry: Mood is appropriate for condition and setting   Data Reviewed: CBC: Recent Labs  Lab 12/29/18 0500 12/30/18 1831 12/31/18 0451 01/01/19 0409 01/02/19 0100 01/03/19 0521  WBC 12.7* 17.0* 16.8* 12.9* 15.2* 20.3*  NEUTROABS 9.9*  --   --   --   --  13.9*  HGB 12.5* 10.4* 10.0* 9.5* 9.8* 9.5*  HCT 38.4* 33.5* 32.5* 30.0* 30.8* 30.2*  MCV 91.0 94.4 94.8 92.6 91.7 93.2  PLT 187 125* 122* 121* 144* 785   Basic Metabolic Panel: Recent Labs  Lab 12/30/18 0558 12/31/18 0451 01/01/19 0409 01/02/19 0100 01/03/19 0521  NA 145 144 142 144 143  K 3.8 3.7 3.5 3.7 4.1  CL 110 110 108 112* 113*  CO2 25 27 25 24 22   GLUCOSE 144* 157* 202* 191* 211*  BUN 13 11 17 16 17   CREATININE 1.35* 1.25* 1.14 1.20 1.23  CALCIUM  8.0* 7.7* 7.6* 7.8* 7.6*  MG 2.1 1.8 1.9 2.0 2.1  PHOS 1.7* 1.5* 2.5 2.1* 3.2   GFR: Estimated Creatinine Clearance: 96.3 mL/min (by C-G formula based on SCr of 1.23 mg/dL). Liver Function Tests: Recent Labs  Lab 12/28/18 1410 12/29/18 0500 12/30/18 0558 12/31/18 0451 01/03/19 0521  AST 24 22 25   --  26  ALT 24 21 18   --  16  ALKPHOS 26* 25* 27*  --  27*  BILITOT 1.7* 1.1 1.0  --  0.5  PROT 5.4* 5.3* 5.2*  --  4.9*  ALBUMIN 2.4* 2.2* 1.9* 1.5* 1.2*   No results for input(s): LIPASE, AMYLASE in the last 168 hours. No results for input(s): AMMONIA in the last 168 hours. Coagulation Profile: Recent Labs  Lab 12/28/18 1410  INR 1.5*   Cardiac Enzymes: No results for input(s): CKTOTAL, CKMB, CKMBINDEX, TROPONINI in the last 168 hours. BNP (last 3 results) No results for input(s): PROBNP in the last 8760 hours. HbA1C: No results for input(s): HGBA1C in the last 72 hours. CBG: Recent Labs  Lab 01/02/19 1952 01/02/19 2337 01/03/19 0336 01/03/19 0839 01/03/19 1201  GLUCAP 190* 216* 221* 212* 197*   Lipid Profile: Recent Labs    01/03/19 0521  TRIG 101   Thyroid Function Tests: No results for input(s): TSH, T4TOTAL, FREET4, T3FREE, THYROIDAB in the last 72 hours. Anemia Panel: No results for input(s): VITAMINB12, FOLATE, FERRITIN, TIBC, IRON, RETICCTPCT in the last 72 hours. Urine analysis:    Component Value Date/Time   COLORURINE YELLOW 12/19/2018 2038   APPEARANCEUR CLEAR 12/19/2018 2038   LABSPEC >1.030 (H) 12/19/2018 2038   LABSPEC 1.025 03/18/2017 1337   PHURINE 5.5 12/19/2018 2038   GLUCOSEU NEGATIVE 12/19/2018 2038   HGBUR MODERATE (A) 12/19/2018 2038   BILIRUBINUR SMALL (A) 12/19/2018 2038   BILIRUBINUR negative 03/18/2017 1337   BILIRUBINUR neg 12/17/2015 0944   KETONESUR NEGATIVE 12/19/2018 2038   PROTEINUR >300 (A) 12/19/2018 2038   UROBILINOGEN negative 12/17/2015 0944   UROBILINOGEN 0.2 12/16/2011 1208   NITRITE NEGATIVE 12/19/2018 2038    LEUKOCYTESUR NEGATIVE 12/19/2018 2038   Sepsis Labs: @LABRCNTIP (procalcitonin:4,lacticidven:4)  ) Recent Results (from the past 240 hour(s))  Surgical pcr screen     Status: None   Collection Time: 12/27/18 11:37 PM   Specimen: Nasal Mucosa; Nasal Swab  Result Value Ref Range Status   MRSA, PCR NEGATIVE NEGATIVE Final   Staphylococcus aureus NEGATIVE NEGATIVE Final    Comment: (NOTE) The Xpert SA Assay (FDA approved for NASAL specimens in patients 16 years of age and older), is one component of a comprehensive surveillance program. It is not intended to diagnose infection nor to guide or monitor treatment. Performed at [facility], [address], [place], [place] [address]       Studies: CT ABDOMEN PELVIS W CONTRAST  Result Date: 01/03/2019 CLINICAL DATA:  Six days status post subtotal colectomy and ileostomy. Rising white blood cell count. EXAM: CT ABDOMEN AND PELVIS WITH CONTRAST TECHNIQUE: Multidetector CT imaging of the abdomen and pelvis was performed using the standard protocol following bolus administration of intravenous contrast. CONTRAST:  OMNIPAQUE IOHEXOL 300 MG/ML  SOLN COMPARISON:  CT scan 12/27/2018 FINDINGS: Lower chest: Small bilateral pleural effusions with overlying atelectasis. The heart is mildly enlarged but stable. Stable pacer wires. Hepatobiliary: Stable liver lesions. No new or worrisome lesions. There is mass effect on the right lateral margin of the liver inferiorly likely due to adjacent abscess. Layering gallstones are noted the gallbladder. No common bile duct dilatation. Pancreas: No mass, inflammation or ductal dilatation. Spleen: Normal size.  No focal lesions. Adrenals/Urinary Tract: The adrenal glands and kidneys are unremarkable. Stable scarring changes involving the right renal cortex posteriorly. No hydronephrosis. The bladder is moderately distended Stomach/Bowel: The stomach, duodenum and small bowel are grossly normal. The mid distal  small bowel loops are slightly dilated and there are scattered air-fluid levels but contrast is getting through the small bowel and this has more the appearance of an ileus. 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 No complicating features are identified. Subtotal colectomy. The rectum and part of the sigmoid colon are still present with a Hartmann's pouch. No complicating features. Vascular/Lymphatic: The aorta and branch vessels are patent. No atherosclerotic calcifications. Scattered mesenteric and retroperitoneal lymph nodes but no mass or overt adenopathy. Reproductive: The prostate gland and seminal vesicles are unremarkable. Other: There is a large right-sided intra-abdominal abscess measuring 20 x 17 x 7.5 cm. This is along the lower lateral margin of the liver and extends down in right paracolic gutter to just above the iliac crest. It contains gas. Diffuse mesenteric edema but no focal mesenteric fluid collections to suggest other abscesses. JP drainage catheter just above the  bladder but no surrounding fluid collections. Musculoskeletal: No significant bony findings. IMPRESSION: 1. Large (20 x 17 x 7.5 cm) intra-abdominal abscess on the right side. 2. Mesenteric edema and a few mesenteric fluid collections but no discrete mesenteric abscess. 3. Right lower quadrant ileostomy and probable mild small-bowel ileus. 4. Small bilateral pleural effusions and bibasilar atelectasis. 5. Moderate distention of the bladder. 6. Probable cholelithiasis. These results will be called to the ordering clinician or representative by the Radiologist Assistant, and communication documented in the PACS or zVision Dashboard. Electronically Signed   By: Rudie Meyer M.D.   On: 01/03/2019 13:40    Scheduled Meds: . chlorhexidine  15 mL Mouth Rinse BID  . Chlorhexidine Gluconate Cloth  6 each Topical Daily  . digoxin  0.125 mg Intravenous Daily  . insulin aspart  0-20 Units Subcutaneous Q4H  . mouth rinse  15 mL  Mouth Rinse q12n4p  . pantoprazole (PROTONIX) IV  40 mg Intravenous Q24H  . sodium chloride flush  10-40 mL Intracatheter Q12H    Continuous Infusions: . sodium chloride Stopped (12/20/18 1319)  . sodium chloride    . fluconazole (DIFLUCAN) IV 400 mg (01/03/19 1323)  . heparin 2,600 Units/hr (01/03/19 1000)  . methocarbamol (ROBAXIN) IV 500 mg (01/03/19 1321)  . piperacillin-tazobactam (ZOSYN)  IV 3.375 g (01/03/19 1414)  . TPN ADULT (ION) 80 mL/hr at 01/03/19 1000  . TPN ADULT (ION)       LOS: 14 days     Darlin Drop, MD [facility] Pager [phone]  If 7PM-7AM, please contact night-coverage [email or web address] Password [facility] 01/03/2019, 2:29 PM

## 2019-01-03 NOTE — Progress Notes (Signed)
PHARMACY - TOTAL PARENTERAL NUTRITION CONSULT NOTE   Indication: massive bowel resection, prolonged NPO  Patient Measurements: Height: 5\' 7"  (170.2 cm) Weight: (!) 321 lb 10.4 oz (145.9 kg) IBW/kg (Calculated) : 66.1 TPN AdjBW (KG): 86.1 Body mass index is 50.38 kg/m.  Assessment:  60 yom presented to the hospital with abdominal pain found to be acute diverticulitis. He was treated conservatively but without improvement. He was taken to the OR 12/15 and found to have a perforated cecum with leakage of stool throughout the abdominal cavity. To start TPN for nutrition support today. He remains on the ventilator with vasopressors. Patient has been NPO or clear liquids since admission so he may be at risk for refeeding syndrome.   Glucose / Insulin: A1c 6.3, CBGs uncontrolled at 190-221 requiring 24 units SSI last 24 hrs. Electrolytes: Mg/K+ normal. Phos 3.2 Renal: SCr 1.23. UOP 0.8 ml/kg/hr LFTs / TGs: LFTs WNL, TG 101 Prealbumin / albumin: prealbumin 6.1, albumin 1.2 Intake / Output; MIVF: drain O/P 350 ml, ileostomy O/P 375 ml, net +6.6L GI Imaging: 12/14 Abd xray: Persistent bowel dilatation. No free air demonstrable 12/14 CT abd: Worsening distal large-bowel obstruction at the level of the descending colon correlating with an approximately 5 cm in length segment of irregular annular wall thickening with underlying colonic diverticulosis and mild pericolonic fat stranding 12/9 Abd xray: Worsening gaseous distension of the proximal colon and now involving much of the small bowel Surgeries / Procedures:  12/15 Exlap, subtotal colectomy and end ileostomy 12/10 Flexible sigmoidoscopy  Central access: PICC 12/15 TPN start date: 12/16  Nutritional Goals (per RD recommendation on 12/17): KCal: 2200-2400, Protein: 130-150, Fluid: >/=1.8 L/day Goal TPN rate is 80 mL/hr (provides 147 g of protein and 1280 kcals per day)  Current Nutrition:  TPN  12/19 CLD started - tolerating per surgery  note  Plan:  Continue TPN at 21mL/hr - provides 147g protein, 384g dextrose and 1896 kcal Hold lipid emulsion for first 7 days for critically ill patients per ASPEN guidelines (Start date 12/23) Electrolytes in TPN: Incr Phos, continue incr K and Mag; Cl:Ac 1:2  Add standard MVI MWF and trace elements daily to TPN Change to Resistant q4h SSI and adjust as needed, add 10 units insulin to TPN  Monitor oral intake Monitor TPN labs   Thank you for involving pharmacy in this patient's care.  Renold Genta, PharmD, BCPS Clinical Pharmacist Clinical phone for 01/03/2019 until 3p is [phone] 01/03/2019 7:22 AM  **Pharmacist phone directory can be found on [email or web address] listed under [place]**

## 2019-01-03 NOTE — Progress Notes (Signed)
Rehab Admissions Coordinator Note:  Patient was screened by Cleatrice Burke for appropriateness for an Inpatient Acute Rehab Consult per OT recs. .  At this time, we are recommending Inpatient Rehab consult. I will place order.  Cleatrice Burke RN MSN 01/03/2019, 3:40 PM  I can be reached at [phone].

## 2019-01-03 NOTE — Progress Notes (Signed)
Chief Complaint: Patient was seen in consultation today for abd abscess drain.  Referring Physician(s): *Dr. Corliss Skainssuei  Supervising Physician: Malachy MoanMcCullough, Heath  [facility]  History of Present Illness: Roberto FillJoseph T Garramone is a 53 y.o. male admitted with perforated colonic mass and underwent subtotal colectomy with ileostomy about a week ago. He developed rising WBC and CT was performed which shows large right sided intra-abdominal abscess IR is asked to place perc drain PMHx, meds, labs, imaging, allergies reviewed. Has been NPO today as directed.    Past Medical History:  Diagnosis Date  . AICD (automatic cardioverter/defibrillator) present 08/2018  . Arthritis   . Chronic combined systolic and diastolic CHF (congestive heart failure) (HCC)   . Deep vein thrombosis (HCC)   . Diverticulitis   . Lupus anticoagulant positive   . Morbid obesity (HCC)   . Mural thrombus of heart    coumadin  . Nonischemic cardiomyopathy (HCC)    a) 12/17/11 echo: LVEF 25-30%, grade 3 diastolic dysfunction (c/w restriction), mod MR, mod LA/LV and mild RA dilatation; b) 12/18/11 cMRI: LVEF 38%, mod LV/mild RV dilatation, global HK, mild-mod RV sys dysfxn, no LV thrombus & patchy non-subendocardial delayed enhancement c/w infil dz or prior myocarditis; c. 07/2018 EF 25-30%.  . Paroxysmal SVT (supraventricular tachycardia) (HCC)   . 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 A-FLUTTER ABLATION N/A 11/17/2016   Procedure: A-FLUTTER ABLATION;  Surgeon: Regan Lemmingamnitz, Will Martin, MD;  [facility]: [facility];  Service: Cardiovascular;  Laterality: N/A;  . BIOPSY  12/23/2018   Procedure: BIOPSY;  Surgeon: Meridee ScoreMansouraty, Netty StarringGabriel Jr., MD;  [facility];  Service: Gastroenterology;;  . CARDIAC CATHETERIZATION  06/2006   Angiographically normal cors  . CARDIAC CATHETERIZATION  12/22/2011   R/LHC: normal cors,  well-compensated HDs, LV dysfxn  . CARDIOVERSION N/A 10/17/2016   Procedure: CARDIOVERSION;  Surgeon: Dolores PattyBensimhon, Daniel R, MD;  [facility];  Service: Cardiovascular;  Laterality: N/A;  . CARDIOVERSION N/A 03/20/2017   Procedure: CARDIOVERSION;  Surgeon: Dolores PattyBensimhon, Daniel R, MD;  [facility];  Service: Cardiovascular;  Laterality: N/A;  . COLECTOMY N/A 12/28/2018   Procedure: TOTAL COLECTOMY;  Surgeon: Emelia LoronWakefield, Matthew, MD;  [facility];  Service: General;  Laterality: N/A;  . FLEXIBLE SIGMOIDOSCOPY N/A 12/23/2018   Procedure: FLEXIBLE SIGMOIDOSCOPY;  Surgeon: Lemar LoftyMansouraty, Gabriel Jr., MD;  [facility];  Service: Gastroenterology;  Laterality: N/A;  . ICD IMPLANT N/A 09/03/2018   Procedure: ICD IMPLANT;  Surgeon: Regan Lemmingamnitz, Will Martin, MD;  [facility];  Service: Cardiovascular;  Laterality: N/A;  . ILEOSTOMY N/A 12/28/2018   Procedure: ILEOSTOMY;  Surgeon: Emelia LoronWakefield, Matthew, MD;  [facility];  Service: General;  Laterality: N/A;  . LEFT AND RIGHT HEART CATHETERIZATION WITH CORONARY ANGIOGRAM N/A 12/22/2011   Procedure: LEFT AND RIGHT HEART CATHETERIZATION WITH CORONARY ANGIOGRAM;  Surgeon: Dolores Pattyaniel R Bensimhon, MD;  [facility];  Service: Cardiovascular;  Laterality: N/A;  . PATELLAR TENDON REPAIR     Left  . RIGHT HEART CATH N/A 10/20/2016   Procedure: RIGHT HEART CATH;  Surgeon: Dolores PattyBensimhon, Daniel R, MD;  [facility];  Service: Cardiovascular;  Laterality: N/A;  . TEE WITHOUT CARDIOVERSION N/A 03/20/2017   Procedure: TRANSESOPHAGEAL ECHOCARDIOGRAM (TEE);  Surgeon: Dolores PattyBensimhon, Daniel R, MD;  [facility];  Service: Cardiovascular;  Laterality: N/A;    Allergies: Shrimp [place]bert [place][shellfish  allergy]  Medications:  Current Facility-Administered Medications:  .  0.9 %  sodium chloride infusion, , Intravenous, PRN, Rometta Emery, MD, Stopped at 12/20/18 1319 .  0.9 %  sodium chloride infusion, 250 mL,  Intravenous, Continuous, Dorthea Cove, NP .  acetaminophen (TYLENOL) suppository 650 mg, 650 mg, Rectal, Q6H PRN, Dorthea Cove, NP, 650 mg at 12/29/18 1951 .  alum & mag hydroxide-simeth (MAALOX/MYLANTA) 200-200-20 MG/5ML suspension 30 mL, 30 mL, Oral, Q4H PRN, Karl Ito, MD, 30 mL at 01/02/19 0218 .  chlorhexidine (PERIDEX) 0.12 % solution 15 mL, 15 mL, Mouth Rinse, BID, Luciano Cutter, MD, 15 mL at 01/03/19 0936 .  Chlorhexidine Gluconate Cloth 2 % PADS 6 each, 6 each, Topical, Daily, Emelia Loron, MD, 6 each at 01/02/19 1100 .  digoxin (LANOXIN) 0.25 MG/ML injection 0.125 mg, 0.125 mg, Intravenous, Daily, Jacinto Halim, PA-C, 0.125 mg at 01/03/19 8466 .  fluconazole (DIFLUCAN) IVPB 400 mg, 400 mg, Intravenous, Q24H, Rumbarger, Faye Ramsay, RPH, Stopped at 01/03/19 1528 .  heparin ADULT infusion 100 units/mL (25000 units/273mL sodium chloride 0.45%), 2,750 Units/hr, Intravenous, Continuous, Ilda Basset, [place], Last Rate: 27.5 mL/hr at 01/03/19 1600, 2,750 Units/hr at 01/03/19 1600 .  HYDROmorphone (DILAUDID) injection 0.5 mg, 0.5 mg, Intravenous, Q3H PRN, [name], Kelly A, PA-C, 0.5 mg at 01/03/19 1116 .  insulin aspart (novoLOG) injection 0-20 Units, 0-20 Units, Subcutaneous, Q4H, Ilda Basset, [place], 4 Units at 01/03/19 1204 .  MEDLINE mouth rinse, 15 mL, Mouth Rinse, q12n4p, Luciano Cutter, MD, 15 mL at 01/03/19 1205 .  methocarbamol (ROBAXIN) 500 mg in dextrose 5 % 50 mL IVPB, 500 mg, Intravenous, Q8H, [name], Kelly A, PA-C, Stopped at 01/03/19 1351 .  morphine 2 MG/ML injection 2-4 mg, 2-4 mg, Intravenous, Q3H PRN, Leslye Peer, MD, 4 mg at 01/03/19 1540 .  ondansetron (ZOFRAN) injection 4-8 mg, 4-8 mg, Intravenous, Q8H PRN, Oretha Milch, MD, 4 mg at 01/02/19 1100 .  pantoprazole (PROTONIX) injection 40 mg, 40 mg, Intravenous, Q24H, Dorthea Cove, NP, 40 mg at 01/03/19 1412 .  piperacillin-tazobactam (ZOSYN) IVPB 3.375 g, 3.375 g, Intravenous, Q8H,  Garba, Mohammad L, MD, Last Rate: 12.5 mL/hr at 01/03/19 1600, Rate Verify at 01/03/19 1600 .  sodium chloride flush (NS) 0.9 % injection 10-40 mL, 10-40 mL, Intracatheter, Q12H, Ollis, Brandi L, NP, 10 mL at 01/02/19 2242 .  sodium chloride flush (NS) 0.9 % injection 10-40 mL, 10-40 mL, Intracatheter, PRN, Ollis, Brandi L, NP, 10 mL at 12/29/18 1834 .  TPN ADULT (ION), , Intravenous, Continuous TPN, Luciano Cutter, MD, Last Rate: 80 mL/hr at 01/03/19 1052, Restarted at 01/03/19 1052 .  TPN ADULT (ION), , Intravenous, Continuous TPN, [place], Lynita Lombard, [place]    Family History  Problem Relation Age of Onset  . Hypertension Mother   . Clotting disorder Mother        mom with PEs  . Cancer Mother        uterine cancer  . Diabetes Father   . Heart attack Father   . Hypertension Other        Sibling  . Diabetes Other   . Obesity Other   . Diabetes Sister   . Obesity Sister   . Obesity Sister   . Colon cancer Neg Hx   . Stomach cancer Neg Hx   . Esophageal cancer Neg Hx   . Rectal cancer Neg Hx   . Liver cancer Neg Hx  Social History   Socioeconomic History  . Marital status: Divorced    Spouse name: Not on file  . Number of children: 5  . Years of education: Not on file  . Highest education level: Not on file  Occupational History  . 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 Smoking status: Never Smoker  . Smokeless tobacco: Never Used  Substance and Sexual Activity  . Alcohol use: No    Comment: Rarely  . Drug use: No  . Sexual activity: Not on file  Other Topics Concern  . Not on file  Social History Narrative   Lives in [place] 03/2015, 5 children.   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  Exercises with weights and some walking. 12/2015   Social Determinants of Health   Financial Resource Strain:   . Difficulty of Paying Living Expenses: Not on file    Food Insecurity:   . Worried About Programme researcher, broadcasting/film/video in the Last Year: Not on file  . Ran Out of Food in the Last Year: Not on file  Transportation Needs:   . Lack of Transportation (Medical): Not on file  . Lack of Transportation (Non-Medical): Not on file  Physical Activity:   . Days of Exercise per Week: Not on file  . Minutes of Exercise per Session: Not on file  Stress:   . Feeling of Stress : Not on file  Social Connections:   . Frequency of Communication with Friends and Family: Not on file  . Frequency of Social Gatherings with Friends and Family: Not on file  . Attends Religious Services: Not on file  . Active Member of Clubs or Organizations: Not on file  . Attends Banker Meetings: Not on file  . Marital Status: Not on file     Review of Systems: A 12 point ROS discussed and pertinent positives are indicated in the HPI above.  All other systems are negative.  Review of Systems  Vital Signs: BP 122/69 (BP Location: Left Arm)   Pulse 74   Temp 98.5 F (36.9 C) (Oral)   Resp 20   Ht 5\' 7"  (1.702 m)   Wt (!) 156.9 kg   SpO2 95%   BMI 54.18 kg/m   Physical Exam Vitals and nursing note reviewed.  Constitutional:      General: He is not in acute distress.    Appearance: He is obese. He is ill-appearing. He is not toxic-appearing.  HENT:     Mouth/Throat:     Mouth: Mucous membranes are moist.     Pharynx: Oropharynx is clear.  Cardiovascular:     Rate and Rhythm: Normal rate and regular rhythm.     Heart sounds: Normal heart sounds.  Pulmonary:     Effort: Pulmonary effort is normal. No respiratory distress.     Breath sounds: Normal breath sounds.  Abdominal:     Palpations: Abdomen is soft.     Tenderness: There is abdominal tenderness.     Comments: Right bad tenderness Midline incision/wound with dressing. Ileostomy noted  Skin:    General: Skin is warm and dry.  Neurological:     General: No focal deficit present.     Mental  Status: He is alert and oriented to person, place, and time.  Psychiatric:        Mood and Affect: Mood normal.  Thought Content: Thought content normal.        Judgment: Judgment normal.      Imaging: CT ABDOMEN PELVIS W CONTRAST  Result Date: 01/03/2019 CLINICAL DATA:  Six days status post subtotal colectomy and ileostomy. Rising white blood cell count. EXAM: CT ABDOMEN AND PELVIS WITH CONTRAST TECHNIQUE: Multidetector CT imaging of the abdomen and pelvis was performed using the standard protocol following bolus administration of intravenous contrast. CONTRAST:  147mL OMNIPAQUE IOHEXOL 300 MG/ML  SOLN COMPARISON:  CT scan 12/27/2018 FINDINGS: Lower chest: Small bilateral pleural effusions with overlying atelectasis. The heart is mildly enlarged but stable. Stable pacer wires. Hepatobiliary: Stable liver lesions. No new or worrisome lesions. There is mass effect on the right lateral margin of the liver inferiorly likely due to adjacent abscess. Layering gallstones are noted the gallbladder. No common bile duct dilatation. Pancreas: No mass, inflammation or ductal dilatation. Spleen: Normal size.  No focal lesions. Adrenals/Urinary Tract: The adrenal glands and kidneys are unremarkable. Stable scarring changes involving the right renal cortex posteriorly. No hydronephrosis. The bladder is moderately distended Stomach/Bowel: The stomach, duodenum and small bowel are grossly normal. The mid distal small bowel loops are slightly dilated and there are scattered air-fluid levels but contrast is getting through the small bowel and this has more the appearance of an ileus. 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 No complicating features are identified. Subtotal colectomy. The rectum and part of the sigmoid colon are still present with a Hartmann's pouch. No complicating features. Vascular/Lymphatic: The aorta and branch vessels are patent. No atherosclerotic calcifications. Scattered mesenteric  and retroperitoneal lymph nodes but no mass or overt adenopathy. Reproductive: The prostate gland and seminal vesicles are unremarkable. Other: There is a large right-sided intra-abdominal abscess measuring 20 x 17 x 7.5 cm. This is along the lower lateral margin of the liver and extends down in right paracolic gutter to just above the iliac crest. It contains gas. Diffuse mesenteric edema but no focal mesenteric fluid collections to suggest other abscesses. JP drainage catheter just above the bladder but no surrounding fluid collections. Musculoskeletal: No significant bony findings. IMPRESSION: 1. Large (20 x 17 x 7.5 cm) intra-abdominal abscess on the right side. 2. Mesenteric edema and a few mesenteric fluid collections but no discrete mesenteric abscess. 3. Right lower quadrant ileostomy and probable mild small-bowel ileus. 4. Small bilateral pleural effusions and bibasilar atelectasis. 5. Moderate distention of the bladder. 6. Probable cholelithiasis. These results will be called to the ordering clinician or representative by the Radiologist Assistant, and communication documented in the PACS or zVision Dashboard. Electronically Signed   By: Marijo Sanes M.D.   On: 01/03/2019 13:40     Labs:  CBC: Recent Labs    12/31/18 0451 01/01/19 0409 01/02/19 0100 01/03/19 0521  WBC 16.8* 12.9* 15.2* 20.3*  HGB 10.0* 9.5* 9.8* 9.5*  HCT 32.5* 30.0* 30.8* 30.2*  PLT 122* 121* 144* 156    COAGS: Recent Labs    12/28/18 0447 12/28/18 1410 12/29/18 0500 12/30/18 0447 12/30/18 1639  INR  --  1.5*  --   --   --   APTT 106*  --  29 32 78*    BMP: Recent Labs    12/31/18 0451 01/01/19 0409 01/02/19 0100 01/03/19 0521  NA 144 142 144 143  K 3.7 3.5 3.7 4.1  CL 110 108 112* 113*  CO2 27 25 24 22   GLUCOSE 157* 202* 191* 211*  BUN 11 17 16 17   CALCIUM  7.7* 7.6* 7.8* 7.6*  CREATININE 1.25* 1.14 1.20 1.23  GFRNONAA >60 >60 >60 >60  GFRAA >60 >60 >60 >60    LIVER FUNCTION  TESTS: Recent Labs    12/28/18 1410 12/29/18 0500 12/30/18 0558 12/31/18 0451 01/03/19 0521  BILITOT 1.7* 1.1 1.0  --  0.5  AST 24 22 25   --  26  ALT 24 21 18   --  16  ALKPHOS 26* 25* 27*  --  27*  PROT 5.4* 5.3* 5.2*  --  4.9*  ALBUMIN 2.4* 2.2* 1.9* 1.5* 1.2*    TUMOR MARKERS: No results for input(s): AFPTM, CEA, CA199, CHROMGRNA in the last 8760 hours.  Assessment and Plan: S/p subtotal colectomy Post op large right abd abscess Plan for CT guided drainage Need to keep NPO p MN and will hold heparin gtt for a few hours prior to procedure. Risks and benefits discussed with the patient including bleeding, infection, damage to adjacent structures, bowel perforation/fistula connection, and sepsis.  All of the patient's questions were answered, patient is agreeable to proceed. Consent signed and in chart.    Thank you for this interesting consult.  I greatly enjoyed meeting Roberto [name] and look forward to participating in their care.  A copy of this report was sent to the requesting provider on this date.  Electronically Signed: Brayton ElKevin Tricia Pledger, PA-C 01/03/2019, 4:18 PM   I spent a total of 25 minutes in face to face in clinical consultation, greater than 50% of which was counseling/coordinating care for abd abscess drain

## 2019-01-03 NOTE — Progress Notes (Signed)
[place] for Heparin  Indication: hx VTE, LV mural thrombus, afib   Allergies  Allergen Reactions  . Shrimp [Shellfish Allergy] Hives and Itching    Patient Measurements: Height: 5\' 7"  (170.2 cm) Weight: (!) 321 lb 10.4 oz (145.9 kg) IBW/kg (Calculated) : 66.1 Heparin Dosing Weight:  101.6 kg  Vital Signs: Temp: 98.4 F (36.9 C) (12/21 0400) Temp Source: Oral (12/21 0400) BP: 103/55 (12/21 0600) Pulse Rate: 81 (12/21 0600)  Labs: Recent Labs    01/01/19 0409 01/02/19 0100 01/02/19 0556 01/03/19 0521  HGB 9.5* 9.8*  --  9.5*  HCT 30.0* 30.8*  --  30.2*  PLT 121* 144*  --  156  HEPARINUNFRC 0.39  --  0.26* <0.10*  CREATININE 1.14 1.20  --  1.23    Estimated Creatinine Clearance: 96.3 mL/min (by C-G formula based on SCr of 1.23 mg/dL).   Assessment: 53 y.o. male with h/o Afib and LV thrombus, Eliquis on hold, for heparin.  Goal of Therapy:  Heparin level 0.3-0.7 units/ml Monitor platelets by anticoagulation protocol: Yes   Plan:  Increase Heparin 2600 units/hr Check heparin level in 8 hours.  Phillis Knack, PharmD, BCPS  01/03/2019, 6:37 AM

## 2019-01-03 NOTE — Progress Notes (Signed)
Physical Therapy Treatment Patient Details Name: Roberto [name] MRN: [id number] DOB: 06/09/1965 Today's Date: 01/03/2019    History of Present Illness Pt is a 53 y.o. M with history of NICM, EF 25-30%, PSVT, PAF, chronic systolic HF with AICD, DVT/PE/LV thrombus , on chronic anticoagulation who was admitted 12/20/2018 for acute diverticulitis. He was initially treated conservatively, but had no real improvement and was taken to the OR 12/28/2018.Pt was found to have a perforated cecum with leakage of stool throughout abdominal cavity. 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 He was brought from the OR to NICU intubated and on pressors. Extubated 12/16.    PT Comments    Bed mobility was painful, but manageable with pain meds.  Once at EOB, pt able to balance and was able to mobilize to standing and take steps to the chair with RW and mod assist of 2 for safety.    Follow Up Recommendations  CIR     Equipment Recommendations  Other (comment)(TBA)    Recommendations for Other Services Rehab consult     Precautions / Restrictions Precautions Precautions: Fall;Other (comment) Precaution Comments: colostomy, JP drain Restrictions Weight Bearing Restrictions: No    Mobility  Bed Mobility Overal bed mobility: Needs Assistance Bed Mobility: Rolling;Sidelying to Sit Rolling: Max assist;+2 for physical assistance Sidelying to sit: Max assist;+2 for physical assistance       General bed mobility comments: Pt maxA +2 for rolling side to side and to faciliate reaching and bending knee; trunk elevation and BLE management required for EOB sitting.  Transfers Overall transfer level: Needs assistance Equipment used: Rolling walker (2 wheeled) Transfers: Sit to/from [employer] Sit to Stand: Mod assist;Max assist;+2 physical assistance;+2 safety/equipment;From elevated surface Stand pivot transfers: Min assist;+2 safety/equipment       General transfer comment:  Pt in pain, but was just given pain meds so able to tolerate; modA to maxA +2 for sit to stand; minA +2 for mobility with RW.  Ambulation/Gait Ambulation/Gait assistance: Min assist;+2 physical assistance Gait Distance (Feet): 3 Feet Assistive device: Rolling walker (2 wheeled)       General Gait Details: guarded sidestepping to the chair with assist for stability and moving the RW   Stairs             Wheelchair Mobility    Modified Rankin (Stroke Patients Only)       Balance Overall balance assessment: Needs assistance   Sitting balance-Leahy Scale: Fair     Standing balance support: Bilateral upper extremity supported Standing balance-Leahy Scale: Poor Standing balance comment: worked on w/shifting, upright stance and stepping in placement                            Cognition Arousal/Alertness: Awake/alert Behavior During Therapy: WFL for tasks assessed/performed Overall Cognitive Status: Within Functional Limits for tasks assessed                                        Exercises Other Exercises Other Exercises: ROM exercise bil with hip/knee flex ext with gross extension resistance.    General Comments        Pertinent Vitals/Pain Pain Assessment: Faces Faces Pain Scale: Hurts whole lot Pain Location: abdomen with movement; 0/10 pain at rest Pain Descriptors / Indicators: Grimacing;Guarding;Pressure Pain Intervention(s): Limited activity within patient's tolerance    Home Living  Family/patient expects to be discharged to:: Private residence Living Arrangements: Children Available Help at Discharge: Family Type of Home: Apartment Home Access: Level entry   Home Layout: One level        Prior Function Level of Independence: Independent      Comments: Independent, community ambulatory, works from home   PT Goals (current goals can now be found in the care plan section) Acute Rehab PT Goals Patient Stated Goal: to  get OOB PT Goal Formulation: With patient Time For Goal Achievement: 01/16/19 Potential to Achieve Goals: Good Progress towards PT goals: Progressing toward goals    Frequency    Min 3X/week      PT Plan Discharge plan needs to be updated    Co-evaluation PT/OT/SLP Co-Evaluation/Treatment: Yes Reason for Co-Treatment: Complexity of the patient's impairments (multi-system involvement);To address functional/ADL transfers PT goals addressed during session: Mobility/safety with mobility OT goals addressed during session: ADL's and self-care      AM-PAC PT "6 Clicks" Mobility   Outcome Measure  Help needed turning from your back to your side while in a flat bed without using bedrails?: Total Help needed moving from lying on your back to sitting on the side of a flat bed without using bedrails?: Total Help needed moving to and from a bed to a chair (including a wheelchair)?: A Lot Help needed standing up from a chair using your arms (e.g., wheelchair or bedside chair)?: A Lot Help needed to walk in hospital room?: A Lot Help needed climbing 3-5 steps with a railing? : Total 6 Click Score: 9    End of Session   Activity Tolerance: Patient tolerated treatment well;Patient limited by pain Patient left: in bed;with call bell/phone within reach Nurse Communication: Mobility status PT Visit Diagnosis: Other abnormalities of gait and mobility (R26.89);Pain;Muscle weakness (generalized) (M62.81) Pain - Right/Left: Right Pain - part of body: (abdoment)     Time: 6734-1937 PT Time Calculation (min) (ACUTE ONLY): 34 min  Charges:  $Therapeutic Activity: 8-22 mins                     01/03/2019  Ginger Carne., PT Acute Rehabilitation Services [phone]  (pager) [phone]  (office)   Roberto [name] 01/03/2019, 5:26 PM

## 2019-01-03 NOTE — Progress Notes (Signed)
Patients blood pressure soft at 95/60 HR 85. This nurse paged Baltazar Najjar on call and made aware

## 2019-01-03 NOTE — Evaluation (Addendum)
Occupational Therapy Evaluation Patient Details Name: Roberto [name] MRN: [id number] DOB: 06-26-1965 Today's Date: 01/03/2019    History of Present Illness Pt is a 53 y.o. M with history of NICM, EF 25-30%, PSVT, PAF, chronic systolic HF with AICD, DVT/PE/LV thrombus , on chronic anticoagulation who was admitted 12/20/2018 for acute diverticulitis. He was initially treated conservatively, but had no real improvement and was taken to the OR 12/28/2018.Pt was found to have a perforated cecum with leakage of stool throughout abdominal cavity. 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 He was brought from the OR to NICU intubated and on pressors. Extubated 12/16.   Clinical Impression   Pt PTA: Pt living with daughter at home, working and independent. Pt currently limited by severe pain in abdomen, decreased activity tolerance and decreased endurance for functional tasks OOB. Pt maxA +2 for rolling side to side and to faciliate reaching and bending knee; trunk elevation and BLE management required for EOB sitting. Pt in pain, but was just given pain meds so able to tolerate; modA to maxA +2 for sit to stand; minA +2 for mobility with RW. Pt requiring minA to near [place] for ADL. Pt sat EOB x5 mins prior to transferring into recliner. Pt able to take a few steps with RW. Pt requires continued OT for skilled services. OT following.   Vital signs: EOB 112/67, 54 BPM, 97% O2 on RA   Follow Up Recommendations  CIR    Equipment Recommendations  3 in 1 bedside commode;Tub/shower seat    Recommendations for Other Services Rehab consult     Precautions / Restrictions Precautions Precautions: Fall;Other (comment) Precaution Comments: colostomy, JP drain Restrictions Weight Bearing Restrictions: No      Mobility Bed Mobility Overal bed mobility: Needs Assistance Bed Mobility: Rolling;Sidelying to Sit Rolling: Max assist;+2 for physical assistance Sidelying to sit: Max assist;+2 for physical  assistance       General bed mobility comments: Pt maxA +2 for rolling side to side and to faciliate reaching and bending knee; trunk elevation and BLE management required for EOB sitting.  Transfers Overall transfer level: Needs assistance Equipment used: Rolling walker (2 wheeled) Transfers: Sit to/from [facility] Sit to Stand: Mod assist;Max assist;+2 physical assistance;+2 safety/equipment;From elevated surface Stand pivot transfers: Min assist;+2 safety/equipment       General transfer comment: Pt in pain, but was just given pain meds so able to tolerate; modA to maxA +2 for sit to stand; minA +2 for mobility with RW.    Balance Overall balance assessment: Needs assistance   Sitting balance-Leahy Scale: Fair     Standing balance support: Bilateral upper extremity supported Standing balance-Leahy Scale: Poor Standing balance comment: RW                           ADL either performed or assessed with clinical judgement   ADL Overall ADL's : Needs assistance/impaired Eating/Feeding: Modified independent   Grooming: Set up;Sitting   Upper Body Bathing: Set up;Sitting   Lower Body Bathing: Maximal assistance;Cueing for safety;Cueing for sequencing;Sitting/lateral leans;Sit to/from stand   Upper Body Dressing : Minimal assistance;Sitting   Lower Body Dressing: Maximal assistance;Cueing for safety;Sitting/lateral leans;Sit to/from stand   Toilet Transfer: Minimal assistance;+2 for physical assistance   Toileting- Clothing Manipulation and Hygiene: Total assistance Toileting - Clothing Manipulation Details (indicate cue type and reason): catheter + colostomy     Functional mobility during ADLs: Minimal assistance;Moderate assistance;+2 for physical assistance;+2 for  safety/equipment;Cueing for safety;Rolling walker General ADL Comments: Pt limited by severe pain in abdomen, decreased activity tolerance and decreased endurance for functional  tasks OOB.     Vision Baseline Vision/History: No visual deficits Patient Visual Report: No change from baseline Vision Assessment?: No apparent visual deficits     Perception     Praxis      Pertinent Vitals/Pain Pain Assessment: Faces Faces Pain Scale: Hurts whole lot Pain Location: abdomen with movement; 0/10 pain at rest Pain Descriptors / Indicators: Grimacing;Guarding;Pressure Pain Intervention(s): Limited activity within patient's tolerance     Hand Dominance Right   Extremity/Trunk Assessment Upper Extremity Assessment Upper Extremity Assessment: Generalized weakness   Lower Extremity Assessment Lower Extremity Assessment: Defer to PT evaluation;Generalized weakness;RLE deficits/detail RLE Deficits / Details: RLE knee flexing less than LLE due to previous TKA   Cervical / Trunk Assessment Cervical / Trunk Assessment: Other exceptions Cervical / Trunk Exceptions: increased body habitus   Communication Communication Communication: No difficulties   Cognition Arousal/Alertness: Awake/alert Behavior During Therapy: WFL for tasks assessed/performed Overall Cognitive Status: Within Functional Limits for tasks assessed                                     General Comments       Exercises     Shoulder Instructions      Home Living Family/patient expects to be discharged to:: Private residence Living Arrangements: Children Available Help at Discharge: Family Type of Home: Apartment Home Access: Level entry     Home Layout: One level     Bathroom Shower/Tub: Chief Strategy Officer: Standard                Prior Functioning/Environment Level of Independence: Independent        Comments: Independent, community ambulatory, works from home        OT Problem List: Decreased strength;Decreased activity tolerance;Impaired balance (sitting and/or standing);Decreased safety awareness;Pain      OT  Treatment/Interventions: Self-care/ADL training;Neuromuscular education;Energy conservation;DME and/or AE instruction;Therapeutic activities;Visual/perceptual remediation/compensation;Patient/family education;Balance training    OT Goals(Current goals can be found in the care plan section) Acute Rehab OT Goals Patient Stated Goal: to get OOB OT Goal Formulation: With patient Time For Goal Achievement: 01/17/19 Potential to Achieve Goals: Good ADL Goals Pt Will Perform Grooming: with supervision;standing Pt Will Perform Upper Body Dressing: with set-up;sitting Pt Will Perform Lower Body Dressing: sitting/lateral leans;sit to/from stand;with min assist;with adaptive equipment Pt Will Transfer to Toilet: with min assist;ambulating;bedside commode Pt Will Perform Toileting - Clothing Manipulation and hygiene: with mod assist;sitting/lateral leans;sit to/from stand Pt/caregiver will Perform Home Exercise Program: Increased strength;Both right and left upper extremity;With Supervision  OT Frequency: Min 2X/week   Barriers to D/C:            Co-evaluation PT/OT/SLP Co-Evaluation/Treatment: Yes Reason for Co-Treatment: Complexity of the patient's impairments (multi-system involvement);To address functional/ADL transfers   OT goals addressed during session: ADL's and self-care      AM-PAC OT "6 Clicks" Daily Activity     Outcome Measure Help from another person eating meals?: None Help from another person taking care of personal grooming?: A Little Help from another person toileting, which includes using toliet, bedpan, or urinal?: Total Help from another person bathing (including washing, rinsing, drying)?: A Lot Help from another person to put on and taking off regular upper body clothing?: A Little Help from another person  to put on and taking off regular lower body clothing?: Total 6 Click Score: 14   End of Session Equipment Utilized During Treatment: Rolling walker Nurse  Communication: Mobility status  Activity Tolerance: Patient limited by pain Patient left: in chair;with call bell/phone within reach;with chair alarm set  OT Visit Diagnosis: Unsteadiness on feet (R26.81);Muscle weakness (generalized) (M62.81);Pain Pain - part of body: (stomach)                Time: 0938-1829 OT Time Calculation (min): 34 min Charges:  OT General Charges $OT Visit: 1 Visit OT Evaluation $OT Eval Moderate Complexity: 1 Mod  Flora Lipps OTR/L Acute Rehabilitation Services Pager: [phone] Office: [phone]   Trek Kimball C 01/03/2019, 3:31 PM

## 2019-01-03 NOTE — Progress Notes (Signed)
Pt transferred to 6 No 12. Bedside handoff given to Letts. Pts phone, charger and clothes moved with pt.

## 2019-01-03 NOTE — Progress Notes (Signed)
6 Days Post-Op   Subjective/Chief Complaint: Patient awake, alert Tolerating clear liquids.  On TPN Ileostomy functioning well WBC increasing despite Zosyn   Objective: Vital signs in last 24 hours: Temp:  [98.4 F (36.9 C)-99.2 F (37.3 C)] 98.8 F (37.1 C) (12/21 0800) Pulse Rate:  [56-100] 89 (12/21 0800) Resp:  [20-40] 28 (12/21 0800) BP: (96-130)/(48-79) 117/48 (12/21 0800) SpO2:  [91 %-100 %] 96 % (12/21 0800) Last BM Date: 01/03/19  Intake/Output from previous day: 12/20 0701 - 12/21 0700 In: 3715.7 [P.O.:240; I.V.:2367; IV Piggyback:1108.7] Out: 3450 [Urine:2725; Drains:350; Stool:375] Intake/Output this shift: Total I/O In: -  Out: 900 [Urine:850; Stool:50]  General appearance: alert, cooperative and no distress Resp: clear to auscultation bilaterally Cardio: regular rate and rhythm GI: soft, distended; ileostomy pink and productive; wound - well-granulated with a few deep areas down to the fascia.  Serosanguinous drainage  Lab Results:  Recent Labs    01/02/19 0100 01/03/19 0521  WBC 15.2* 20.3*  HGB 9.8* 9.5*  HCT 30.8* 30.2*  PLT 144* 156   BMET Recent Labs    01/02/19 0100 01/03/19 0521  NA 144 143  K 3.7 4.1  CL 112* 113*  CO2 24 22  GLUCOSE 191* 211*  BUN 16 17  CREATININE 1.20 1.23  CALCIUM 7.8* 7.6*   PT/INR No results for input(s): LABPROT, INR in the last 72 hours. ABG No results for input(s): PHART, HCO3 in the last 72 hours.  Invalid input(s): PCO2, PO2  Studies/Results: DG Abd 1 View  Result Date: 01/02/2019 CLINICAL DATA:  53 year old male status post total colectomy and ileostomy. Right-sided abdominal pain. EXAM: ABDOMEN - 1 VIEW COMPARISON:  Most recent prior abdominal radiograph 12/27/2018 FINDINGS: Solitary loop of air-filled and distended small bowel present in the left hemiabdomen. The stomach is also partially filled with gas. Elevation of the right hemidiaphragm with associated right basilar atelectasis. Cardiac and  mediastinal contours are within normal limits. Incompletely imaged intracardiac defibrillator lead. IMPRESSION: Dilated and air-filled loops of small bowel in the left hemiabdomen likely reflect ileus in the postoperative setting. Electronically Signed   By: Jacqulynn Cadet M.D.   On: 01/02/2019 14:09    Anti-infectives: Anti-infectives (From admission, onward)   Start     Dose/Rate Route Frequency Ordered Stop   12/28/18 1400  fluconazole (DIFLUCAN) IVPB 400 mg     400 mg 100 mL/hr over 120 Minutes Intravenous Every 24 hours 12/28/18 1318     12/20/18 2200  piperacillin-tazobactam (ZOSYN) IVPB 3.375 g     3.375 g 12.5 mL/hr over 240 Minutes Intravenous Every 8 hours 12/20/18 1834     12/20/18 0645  piperacillin-tazobactam (ZOSYN) IVPB 3.375 g  Status:  Discontinued     3.375 g 100 mL/hr over 30 Minutes Intravenous Every 6 hours 12/20/18 0630 12/20/18 1833   12/19/18 2315  piperacillin-tazobactam (ZOSYN) IVPB 3.375 g     3.375 g 100 mL/hr over 30 Minutes Intravenous  Once 12/19/18 2302 12/20/18 0003      Assessment/Plan: s/p Procedure(s): TOTAL COLECTOMY (N/A) ILEOSTOMY (N/A) POD#6Subtotal colectomy/ileostomy for left colon mass (path pending) and perforated right colon- [place] - TPN, clears  - path pending -Continue BIDdressing changes -WOCconsult - WBCup  to 15,200 . continue IV zosyn.  CT abd/ pelvis today to rule out abscess pressors off - WILL NEED EXTENSIVE PT/OT  FEN: NPO, IVF; TPN;check KUB VTE: heparin gtt ID: IV Zosyn 12/6>>, IV diflucan 12/15  LOS: 14 days    Maia Petties 01/03/2019

## 2019-01-03 NOTE — Progress Notes (Signed)
Pharmacy Antibiotic Note  Roberto [name] is a 53 y.o. male admitted on 12/19/2018 with abdominal pain. Pharmacy has been consulted for fluconazole dosing for possible intra-abdominal infection. Today is day #7. He is afebrile, WBC trending up, no culture data. Renal function has improved.  Plan: Continue fluconazole 400 mg IV q24h Pharmacy signing off this consult but will still follow for TPN and renal adjustment if needed   Height: 5\' 7"  (170.2 cm) Weight: (!) 321 lb 10.4 oz (145.9 kg) IBW/kg (Calculated) : 66.1  Temp (24hrs), Avg:98.7 F (37.1 C), Min:98.4 F (36.9 C), Max:99.2 F (37.3 C)  Recent Labs  Lab 12/28/18 1400 12/30/18 0558 12/30/18 1831 12/31/18 0451 01/01/19 0409 01/02/19 0100 01/03/19 0521  WBC  --   --  17.0* 16.8* 12.9* 15.2* 20.3*  CREATININE  --  1.35*  --  1.25* 1.14 1.20 1.23  LATICACIDVEN 1.7  --   --   --   --   --   --     Estimated Creatinine Clearance: 96.3 mL/min (by C-G formula based on SCr of 1.23 mg/dL).    Allergies  Allergen Reactions  . Shrimp [Shellfish Allergy] Hives and Itching    Antimicrobials this admission: Zosyn 12/7>> Flucon 12/15>>  Dose adjustments this admission: N/A  Microbiology results: 12/14 MRSA PCR: NEG 12/6 COVID - NEG  Thank you for involving pharmacy in this patient's care.  Renold Genta, PharmD, BCPS Clinical Pharmacist Clinical phone for 01/03/2019 until 3p is [phone] 01/03/2019 1:20 PM  **Pharmacist phone directory can be found on [email or web address] listed under [place]**

## 2019-01-03 NOTE — Progress Notes (Signed)
Progress Note  Patient Name: Roberto [name] Date of Encounter: 01/03/2019  Primary Cardiologist: Arvilla Meres, MD   Subjective   Patient in ICU. No chest pain. On 2L O2. Tele shows NSR with frequent PVCs.   Inpatient Medications    Scheduled Meds: . sodium chloride   Intravenous Once  . chlorhexidine  15 mL Mouth Rinse BID  . Chlorhexidine Gluconate Cloth  6 each Topical Daily  . digoxin  0.125 mg Intravenous Daily  . insulin aspart  0-15 Units Subcutaneous Q4H  . mouth rinse  15 mL Mouth Rinse q12n4p  . pantoprazole (PROTONIX) IV  40 mg Intravenous Q24H  . sodium chloride flush  10-40 mL Intracatheter Q12H   Continuous Infusions: . sodium chloride Stopped (12/20/18 1319)  . sodium chloride    . fluconazole (DIFLUCAN) IV Stopped (01/02/19 1818)  . heparin 2,400 Units/hr (01/03/19 0602)  . methocarbamol (ROBAXIN) IV 100 mL/hr at 01/03/19 0600  . piperacillin-tazobactam (ZOSYN)  IV 12.5 mL/hr at 01/03/19 0600  . TPN ADULT (ION) 80 mL/hr at 01/03/19 0600   PRN Meds: sodium chloride, acetaminophen, alum & mag hydroxide-simeth, HYDROmorphone (DILAUDID) injection, morphine injection, ondansetron (ZOFRAN) IV, sodium chloride flush   Vital Signs    Vitals:   01/03/19 0500 01/03/19 0600 01/03/19 0700 01/03/19 0800  BP: 108/61 (!) 103/55 104/60 (!) 117/48  Pulse: 76 81 85 89  Resp: (!) 21 (!) 40 (!) 29 (!) 28  Temp:      TempSrc:      SpO2: 95% 96% 97% 96%  Weight:      Height:        Intake/Output Summary (Last 24 hours) at 01/03/2019 0848 Last data filed at 01/03/2019 0800 Gross per 24 hour  Intake 3715.7 ml  Output 4300 ml  Net -584.3 ml   Last 3 Weights 12/31/2018 12/30/2018 12/29/2018  Weight (lbs) 321 lb 10.4 oz 324 lb 8.3 oz 314 lb 9.5 oz  Weight (kg) 145.9 kg 147.2 kg 142.7 kg      Telemetry    NSR, frequent PVCs, HR 70-80s - Personally Reviewed  ECG    No new - Personally Reviewed  Physical Exam   GEN: No acute distress.   Neck: no JVD  Cardiac: RRR, no murmurs, rubs, or gallops.  Respiratory: Clear to auscultation bilaterally. GI: Obese, firm, distended MS: No edema; No deformity. Neuro:  Nonfocal  Psych: Normal affect   Labs    High Sensitivity Troponin:  No results for input(s): TROPONINIHS in the last 720 hours.    Chemistry Recent Labs  Lab 12/29/18 0500 12/30/18 0558 12/31/18 0451 01/01/19 0409 01/02/19 0100 01/03/19 0521  NA 144 145 144 142 144 143  K 4.2 3.8 3.7 3.5 3.7 4.1  CL 109 110 110 108 112* 113*  CO2 25 25 27 25 24 22   GLUCOSE 123* 144* 157* 202* 191* 211*  BUN 16 13 11 17 16 17   CREATININE 1.68* 1.35* 1.25* 1.14 1.20 1.23  CALCIUM 8.1* 8.0* 7.7* 7.6* 7.8* 7.6*  PROT 5.3* 5.2*  --   --   --  4.9*  ALBUMIN 2.2* 1.9* 1.5*  --   --  1.2*  AST 22 25  --   --   --  26  ALT 21 18  --   --   --  16  ALKPHOS 25* 27*  --   --   --  27*  BILITOT 1.1 1.0  --   --   --  0.5  GFRNONAA 46* 60* >60 >60 >60 >60  GFRAA 53* >60 >60 >60 >60 >60  ANIONGAP 10 10 7 9 8 8      Hematology Recent Labs  Lab 01/01/19 0409 01/02/19 0100 01/03/19 0521  WBC 12.9* 15.2* 20.3*  RBC 3.24* 3.36* 3.24*  HGB 9.5* 9.8* 9.5*  HCT 30.0* 30.8* 30.2*  MCV 92.6 91.7 93.2  MCH 29.3 29.2 29.3  MCHC 31.7 31.8 31.5  RDW 14.6 14.7 15.3  PLT 121* 144* 156    BNPNo results for input(s): BNP, PROBNP in the last 168 hours.   DDimer No results for input(s): DDIMER in the last 168 hours.   Radiology    DG Abd 1 View  Result Date: 01/02/2019 CLINICAL DATA:  53 year old male status post total colectomy and ileostomy. Right-sided abdominal pain. EXAM: ABDOMEN - 1 VIEW COMPARISON:  Most recent prior abdominal radiograph 12/27/2018 FINDINGS: Solitary loop of air-filled and distended small bowel present in the left hemiabdomen. The stomach is also partially filled with gas. Elevation of the right hemidiaphragm with associated right basilar atelectasis. Cardiac and mediastinal contours are within normal limits. Incompletely  imaged intracardiac defibrillator lead. IMPRESSION: Dilated and air-filled loops of small bowel in the left hemiabdomen likely reflect ileus in the postoperative setting. Electronically Signed   By: Jacqulynn Cadet M.D.   On: 01/02/2019 14:09    Cardiac Studies   Echocardiogram 07/20/2018 IMPRESSIONS  1. The left ventricle has severely reduced systolic function, with an ejection fraction of 25-30%. The cavity size was moderately dilated. Left ventricular diastolic Doppler parameters are consistent with pseudonormalization. Left ventricular diffuse  hypokinesis. 2. The right ventricle has normal systolic function. The cavity was normal. There is no increase in right ventricular wall thickness. 3. Left atrial size was mildly dilated. 4. No evidence of mitral valve stenosis. Trivial mitral regurgitation. 5. The aortic valve is tricuspid. No stenosis of the aortic valve. 6. The aortic root is normal in size and structure. 7. The inferior vena cava was dilated in size with <50% respiratory variability. No complete TR doppler jet so unable to estimate PA systolic pressure.  Patient Profile     53 y.o. male  with a hx of chronic diastolic HF,h/o ofLv thrombus, nonischemic cardiomyopathy (EF 25-30% 2013, EF 2020 25-30%), ICD placed 08/2018, PSVT (elected for medical therapy over RFA), DVT/PE, Paroxysmal afib on amiodarone, Aflutter ablation 11/2016, lupus, and morbid obesityinitially seen for preop evaluation in setting of abdominal pain/acute diverticulitis. Followed for hx of CHF.  Assessment & Plan    Acute diverticulitis/Colon Mass/ Sepsis Patient was admitted 12/7 for acute diverticulitis and was being treated medically but did not improved. 12/15 he was taken to the OR found to have perforated cecum and surgery found a mass. In the OR patient became hypotensive and required pressors. Patient was intubated and transferred to ICU for sepsis, place on IV abx. Patient was extubated  12/16. - Path from colon mass shows benign colonic mucosa with mild reactive changes, no active inflammation, no malignancy - Still on TPN, clears per surgery - Off pressors - Management per CCM/IM - Plan to transfer to telemetry and to [facility]  Chronic systolic HF (LVEF 58-52%) - Followed by Advanced Heart Failure team - At baseline was on Entresto, spironolactone, and Torsemide. All were held during admission - Digoxin was continued. Level was 0.3 2 days ago. Will recheck - creatinine has been stable, Today 1.23 - Since admission+5.y L. Fluid intake by TPN  - Weight down 3 lbs since yesterday -  Patients fluid status so far stable. Abdomen firm  - continue to monitor fluid status  Paroxysmal Afib - Had Aflutter ablation 2018 - On amiodarone at home.  - Eliquis for stroke prevention>>was held for surgery and placed on heparin.  - Patient has been maintaining NSR    For questions or updates, please contact [facility] Please consult [email or web address] for contact info under        Signed, Cylas Falzone David Stall, PA-C  01/03/2019, 8:48 AM

## 2019-01-04 ENCOUNTER — Inpatient Hospital Stay (HOSPITAL_COMMUNITY): Payer: BC Managed Care – PPO

## 2019-01-04 DIAGNOSIS — I472 Ventricular tachycardia: Secondary | ICD-10-CM

## 2019-01-04 DIAGNOSIS — I5023 Acute on chronic systolic (congestive) heart failure: Secondary | ICD-10-CM

## 2019-01-04 LAB — COMPREHENSIVE METABOLIC PANEL
ALT: 17 U/L (ref 0–44)
AST: 29 U/L (ref 15–41)
Albumin: 1.2 g/dL — ABNORMAL LOW (ref 3.5–5.0)
Alkaline Phosphatase: 39 U/L (ref 38–126)
Anion gap: 10 (ref 5–15)
BUN: 19 mg/dL (ref 6–20)
CO2: 22 mmol/L (ref 22–32)
Calcium: 7.7 mg/dL — ABNORMAL LOW (ref 8.9–10.3)
Chloride: 109 mmol/L (ref 98–111)
Creatinine, Ser: 1.3 mg/dL — ABNORMAL HIGH (ref 0.61–1.24)
GFR calc Af Amer: >60 mL/min (ref >60)
GFR calc non Af Amer: >60 mL/min (ref >60)
Glucose, Bld: 214 mg/dL — ABNORMAL HIGH (ref 70–99)
Potassium: 4.2 mmol/L (ref 3.5–5.1)
Sodium: 141 mmol/L (ref 135–145)
Total Bilirubin: 1 mg/dL (ref 0.3–1.2)
Total Protein: 5.3 g/dL — ABNORMAL LOW (ref 6.5–8.1)

## 2019-01-04 LAB — HEPARIN LEVEL (UNFRACTIONATED)
Heparin Unfractionated: 0.21 IU/mL — ABNORMAL LOW (ref 0.30–0.70)
Heparin Unfractionated: <0.1 IU/mL — ABNORMAL LOW (ref 0.30–0.70)

## 2019-01-04 LAB — GLUCOSE, CAPILLARY
Glucose-Capillary: 184 mg/dL — ABNORMAL HIGH (ref 70–99)
Glucose-Capillary: 186 mg/dL — ABNORMAL HIGH (ref 70–99)
Glucose-Capillary: 195 mg/dL — ABNORMAL HIGH (ref 70–99)
Glucose-Capillary: 201 mg/dL — ABNORMAL HIGH (ref 70–99)
Glucose-Capillary: 201 mg/dL — ABNORMAL HIGH (ref 70–99)

## 2019-01-04 LAB — CBC
HCT: 31 % — ABNORMAL LOW (ref 39.0–52.0)
Hemoglobin: 9.5 g/dL — ABNORMAL LOW (ref 13.0–17.0)
MCH: 28.7 pg (ref 26.0–34.0)
MCHC: 30.6 g/dL (ref 30.0–36.0)
MCV: 93.7 fL (ref 80.0–100.0)
Platelets: 201 10*3/uL (ref 150–400)
RBC: 3.31 MIL/uL — ABNORMAL LOW (ref 4.22–5.81)
RDW: 15.8 % — ABNORMAL HIGH (ref 11.5–15.5)
WBC: 18.9 10*3/uL — ABNORMAL HIGH (ref 4.0–10.5)
nRBC: 0.4 % — ABNORMAL HIGH (ref 0.0–0.2)

## 2019-01-04 LAB — SURGICAL PATHOLOGY

## 2019-01-04 MED ORDER — FLUMAZENIL 0.5 MG/5ML IV SOLN
INTRAVENOUS | Status: AC
  Filled 2019-01-04: qty 5

## 2019-01-04 MED ORDER — AMIODARONE HCL IN DEXTROSE 360-4.14 MG/200ML-% IV SOLN
30.0000 mg/h | INTRAVENOUS | Status: DC
  Administered 2019-01-04 – 2019-01-08 (×9): 30 mg/h via INTRAVENOUS
  Filled 2019-01-04 (×10): qty 200

## 2019-01-04 MED ORDER — FUROSEMIDE 10 MG/ML IJ SOLN
40.0000 mg | Freq: Two times a day (BID) | INTRAMUSCULAR | Status: DC
  Administered 2019-01-04 – 2019-01-06 (×4): 40 mg via INTRAVENOUS
  Filled 2019-01-04 (×4): qty 4

## 2019-01-04 MED ORDER — TRACE MINERALS CU-MN-SE-ZN 300-55-60-3000 MCG/ML IV SOLN
INTRAVENOUS | Status: DC
  Filled 2019-01-04: qty 985.6

## 2019-01-04 MED ORDER — TRACE MINERALS CU-MN-SE-ZN 300-55-60-3000 MCG/ML IV SOLN
INTRAVENOUS | Status: AC
  Filled 2019-01-04: qty 985.6

## 2019-01-04 MED ORDER — NALOXONE HCL 0.4 MG/ML IJ SOLN
INTRAMUSCULAR | Status: AC
  Filled 2019-01-04: qty 1

## 2019-01-04 MED ORDER — MIDAZOLAM HCL 2 MG/2ML IJ SOLN
INTRAMUSCULAR | Status: AC
  Filled 2019-01-04: qty 6

## 2019-01-04 MED ORDER — MIDAZOLAM HCL 2 MG/2ML IJ SOLN
INTRAMUSCULAR | Status: AC | PRN
  Administered 2019-01-04: 0.5 mg via INTRAVENOUS
  Administered 2019-01-04: 1 mg via INTRAVENOUS

## 2019-01-04 MED ORDER — LIDOCAINE HCL 1 % IJ SOLN
INTRAMUSCULAR | Status: AC
  Filled 2019-01-04: qty 20

## 2019-01-04 MED ORDER — FENTANYL CITRATE (PF) 100 MCG/2ML IJ SOLN
INTRAMUSCULAR | Status: AC | PRN
  Administered 2019-01-04 (×2): 50 ug via INTRAVENOUS
  Administered 2019-01-04: 25 ug via INTRAVENOUS

## 2019-01-04 MED ORDER — FENTANYL CITRATE (PF) 100 MCG/2ML IJ SOLN
INTRAMUSCULAR | Status: AC
  Filled 2019-01-04: qty 6

## 2019-01-04 MED ORDER — AMIODARONE HCL IN DEXTROSE 360-4.14 MG/200ML-% IV SOLN
60.0000 mg/h | INTRAVENOUS | Status: AC
  Administered 2019-01-04: 17:00:00 60 mg/h via INTRAVENOUS
  Filled 2019-01-04 (×2): qty 200

## 2019-01-04 NOTE — Progress Notes (Addendum)
Nutrition Follow-up  DOCUMENTATION CODES:   Morbid obesity  INTERVENTION:   -TPN management per pharmacy -RD will follow for diet advancement and supplement diet as appropriate  NUTRITION DIAGNOSIS:   Inadequate oral intake related to inability to eat as evidenced by NPO status.  Ongoing  GOAL:   Patient will meet greater than or equal to 90% of their needs  Met with TPN  MONITOR:   Vent status, Skin, Weight trends, Labs, I & O's  REASON FOR ASSESSMENT:   Consult New TPN/TNA  ASSESSMENT:   53 y/o gentleman with a history of chronic HFrEF 2/2 NICM, chronic AC for history of PE and DVT, paoxysmal Afib, and history of LV thrombus (Eliquis PTA) who had a distal bowel obstruction and perforation due to a colonic stricture who underwent ex-lap with a subtotal colectomy and diverting ileostomy. He required vasopressors intraoperatively and was brought to the ICU for continued vent management and septic shock due to intrabdominal sepsis.  Procedure (12/15): Exploratory laparotomy Subtotal colectomy End ileostomy 12/16 extubated; TPN started; plan to continue NG until 12/18 then can possibly adv diet 12/17 extubated 12/18 NGT d/c  Reviewed I/O's: -596 ml x 24 hours and +6.1 L since 12/21/18  UOP: 3.3 L x 24 hours  Drain output: 220 ml x 24 hours  Ileostomy output: 160 ml x 24 hours  Per general surgery notes, pathology without evidence of malignancy. CT of abdomen and pelvis revealed rt peritoneal abscess; RN confirms NPO for drain placement today with IR (will advance to clear liquids after procedure).   Pt remains dependent on TPN; pt receiving TPN at 80 ml/hr, which provides 2377 kcals and 147 grams protein, meeting 100% of estimated kcal and protein needs.   Per discussion with RN, plan to transfer to progressive care unit for more aggressive cardiac monitoring.   Labs reviewed: CBGS: 195-201 (inpatient orders for glycemic control are 0-20 units insulin aspart  every 4 hours).   Diet Order:   Diet Order            Diet NPO time specified Except for: Sips with Meds  Diet effective midnight              EDUCATION NEEDS:   Not appropriate for education at this time  Skin:  Skin Assessment: Skin Integrity Issues: Skin Integrity Issues:: Incisions Incisions: abdomen  Last BM:  01/04/19 (20 ml output via ileostomy)  Height:   Ht Readings from Last 1 Encounters:  12/31/18 _0  (1.702 m)    Weight:   Wt Readings from Last 1 Encounters:  01/03/19 (!) 156.9 kg    Ideal Body Weight:  67.27 kg  BMI:  Body mass index is 54.18 kg/m.  Estimated Nutritional Needs:   Kcal:  2200-2400  Protein:  130-150 grams  Fluid:  >/= 1.8 L/day    Lameka Disla A. Jimmye Norman, RD, LDN, [place] Registered Dietitian II Certified Diabetes Care and Education Specialist Pager: [phone] After hours Pager: [phone]

## 2019-01-04 NOTE — Progress Notes (Signed)
PROGRESS NOTE  Roberto [name] ZOX:[id number] DOB: 11-15-1965 DOA: 12/19/2018 PCP: Denita Lung, MD  HPI/Recap of past 24 hours: Pt. With history of NICM, EF 25-30%, PSVT, PAF, chronic systolic HF with AICD, hx of DVT/PE/LV thrombus , on chronic anticoagulation ( bridged with heparin), was admitted 12/20/2018 for acute diverticulitis. He was initially treated conservatively, but had no real improvement and was taken to the OR 12/28/2018. Pt had Left colonic stricture of unknown etiology, and perforated cecum. Per surgery's note there was a mass in the mid descending colon. The right colon  was ischemic and had perforated. There was visualization of leakage of a large amount of stool throughout his entire abdominal cavity.  In OR patient became hypotensive, required pressors  (Phenylephrine and Levo).  Surgery felt the patient was not ready for extubation, and considering co morbidities and possible septic insult asked PCCM to admit to ICU manage care.  Transferred to [facility] service on 01/03/2019 now off pressors.  01/04/19: Seen and examined. Reports abdominal pain 7 out of 10 prior to pain medication.  CT-guided drainage of peritoneal abscess by interventional radiology.   Assessment/Plan: Principal Problem:   Acute diverticulitis Active Problems:   Obesity, morbid (HCC)   Nonischemic cardiomyopathy (HCC)   Paroxysmal SVT (supraventricular tachycardia) (HCC)   Chronic systolic heart failure (HCC)   Chronic pain of right knee   History of DVT (deep vein thrombosis)   PAF (paroxysmal atrial fibrillation) (HCC)   Pre-operative cardiovascular examination   Bowel obstruction (HCC)   Respiratory failure (HCC)   Septic shock secondary to perforated cecum and contamination of abdominal cavity with stool status post subtotal colectomy and ileostomy Off pressors Wound care and drain management per surgery Continue Zosyn and fluconazole Worsening leukocytosis Monitor WBC and fever  curve.  Peritoneal abscess status post CT-guided drainage by interventional radiologist on 01/04/2019, Dr. Kathlene Cote. Management per interventional radiology  POD #7 post subtotal colectomy/ileostomy for left colon mass Continue TPN Tolerating clear liquids Pathology is pending Continue twice daily dressing changes as recommended by surgery Wound care specialist following  Acute hypoxic respiratory failure possibly secondary to postop atelectasis Normal oxygen supplementation at baseline Continue to maintain O2 saturation greater than 92%  Chronic combined systolic and diastolic CHF Last 2D echo LVEF 25 to 30% Status post AICD Continue strict I's and O's and daily weight  Paroxysmal A. fib Continue digoxin and heparin drip Cardiology following.  History of DVT/PE/LV thrombus Continue heparin drip  Morbid obesity BMI 54 Recommend weight loss outpatient once stable  Significant Hospital Events   12/20/2018 Admission 12/28/2018 To OR >> perforated cecum with stool contamination to abdomen  Consults:  Surgery GI Cards  Procedures:  12/28/2018 Exploratory laparotomy Subtotal colectomy End ileostomy  Procedures:  12/28/2018 Exploratory laparotomy Subtotal colectomy End ileostomy  Significant Diagnostic Tests:  12/14 CT Abdomen Pelvis Worsening distal large-bowel obstruction at the level of the descending colon correlating with an approximately 5 cm in length segment of irregular annular wall thickening with underlying colonic diverticulosis and mild pericolonic fat stranding. Imaging findings remain indeterminate for malignant versus diverticular descending colonic stricture. New diffuse small bowel dilatation with air-fluid levels. No free air or abscess. Chronic findings include: Mild cardiomegaly. Small hiatal hernia. Posterior liver masses, decreased in size since 2010, compatible with benign lesions.  12/27/2018 KUB Nasogastric tube tip and side port in  stomach. Persistent bowel dilatation. No free air demonstrable  12/23/2018  Biopsy >> Colon COLON, DESCENDING, BIOPSY:  - Benign colonic mucosa with  mild reactive changes.  - No active inflammation.  - No dysplasia or malignancy  Micro Data:  12/20/2018  SARS Coronavirus 2 NEGATIVE NEGATIVE   12/20/2018 MRSA, PCR NEGATIVE NEGATIVE  NEGATIVE   Staphylococcus aureus NEGATIVE NEGATIVE  NEGATIVE    Antimicrobials:  Zosyn 12/20/2018>> Diflucan 12/15>>     Objective: Vitals:   01/04/19 1420 01/04/19 1425 01/04/19 1430 01/04/19 1715  BP: 106/62 100/65 97/63 (!) 142/74  Pulse: 85 96 85 83  Resp: 15 18 15 16   Temp:    98.9 F (37.2 C)  TempSrc:    Oral  SpO2: 95% 96% 96% 96%  Weight:      Height:        Intake/Output Summary (Last 24 hours) at 01/04/2019 2032 Last data filed at 01/04/2019 2018 Gross per 24 hour  Intake 1157.42 ml  Output 4051 ml  Net -2893.58 ml   Filed Weights   12/30/18 0500 12/31/18 0500 01/03/19 1610  Weight: (!) 147.2 kg (!) 145.9 kg (!) 156.9 kg    Exam:  . General: 53 y.o. year-old male severely obese in no acute distress.  Alert and oriented x3.  . Cardiovascular: Regular rate and rhythm no rubs or gallops. [name] Respiratory: Clear auscultation no wheezes or rales.   . Abdomen: Mildly distended.  Hypoactive bowel sounds present.  Right colostomy bag in place with stool.  .  Musculoskeletal: Trace lower extremity edema bilaterally Psychiatry: Mood is appropriate for condition and setting.  Data Reviewed: CBC: Recent Labs  Lab 12/29/18 0500 12/31/18 0451 01/01/19 0409 01/02/19 0100 01/03/19 0521 01/04/19 0415  WBC 12.7* 16.8* 12.9* 15.2* 20.3* 18.9*  NEUTROABS 9.9*  --   --   --  13.9*  --   HGB 12.5* 10.0* 9.5* 9.8* 9.5* 9.5*  HCT 38.4* 32.5* 30.0* 30.8* 30.2* 31.0*  MCV 91.0 94.8 92.6 91.7 93.2 93.7  PLT 187 122* 121* 144* 156 201   Basic Metabolic Panel: Recent Labs  Lab 12/30/18 0558 12/31/18 0451 01/01/19 0409  01/02/19 0100 01/03/19 0521 01/04/19 0415  NA 145 144 142 144 143 141  K 3.8 3.7 3.5 3.7 4.1 4.2  CL 110 110 108 112* 113* 109  CO2 25 27 25 24 22 22   GLUCOSE 144* 157* 202* 191* 211* 214*  BUN 13 11 17 16 17 19   CREATININE 1.35* 1.25* 1.14 1.20 1.23 1.30*  CALCIUM 8.0* 7.7* 7.6* 7.8* 7.6* 7.7*  MG 2.1 1.8 1.9 2.0 2.1  --   PHOS 1.7* 1.5* 2.5 2.1* 3.2  --    GFR: Estimated Creatinine Clearance: 95.2 mL/min (A) (by C-G formula based on SCr of 1.3 mg/dL (H)). Liver Function Tests: Recent Labs  Lab 12/29/18 0500 12/30/18 0558 12/31/18 0451 01/03/19 0521 01/04/19 0415  AST 22 25  --  26 29  ALT 21 18  --  16 17  ALKPHOS 25* 27*  --  27* 39  BILITOT 1.1 1.0  --  0.5 1.0  PROT 5.3* 5.2*  --  4.9* 5.3*  ALBUMIN 2.2* 1.9* 1.5* 1.2* 1.2*   No results for input(s): LIPASE, AMYLASE in the last 168 hours. No results for input(s): AMMONIA in the last 168 hours. Coagulation Profile: No results for input(s): INR, PROTIME in the last 168 hours. Cardiac Enzymes: No results for input(s): CKTOTAL, CKMB, CKMBINDEX, TROPONINI in the last 168 hours. BNP (last 3 results) No results for input(s): PROBNP in the last 8760 hours. HbA1C: No results for input(s): HGBA1C in the last 72 hours. CBG: Recent  Labs  Lab 01/03/19 1951 01/03/19 2341 01/04/19 0356 01/04/19 0737 01/04/19 1228  GLUCAP 188* 184* 186* 195* 201*   Lipid Profile: Recent Labs    01/03/19 0521  TRIG 101   Thyroid Function Tests: No results for input(s): TSH, T4TOTAL, FREET4, T3FREE, THYROIDAB in the last 72 hours. Anemia Panel: No results for input(s): VITAMINB12, FOLATE, FERRITIN, TIBC, IRON, RETICCTPCT in the last 72 hours. Urine analysis:    Component Value Date/Time   COLORURINE YELLOW 12/19/2018 2038   APPEARANCEUR CLEAR 12/19/2018 2038   LABSPEC >1.030 (H) 12/19/2018 2038   LABSPEC 1.025 03/18/2017 1337   PHURINE 5.5 12/19/2018 2038   GLUCOSEU NEGATIVE 12/19/2018 2038   HGBUR MODERATE (A) 12/19/2018  2038   BILIRUBINUR SMALL (A) 12/19/2018 2038   BILIRUBINUR negative 03/18/2017 1337   BILIRUBINUR neg 12/17/2015 0944   KETONESUR NEGATIVE 12/19/2018 2038   PROTEINUR >300 (A) 12/19/2018 2038   UROBILINOGEN negative 12/17/2015 0944   UROBILINOGEN 0.2 12/16/2011 1208   NITRITE NEGATIVE 12/19/2018 2038   LEUKOCYTESUR NEGATIVE 12/19/2018 2038   Sepsis Labs: @LABRCNTIP (procalcitonin:4,lacticidven:4)  ) Recent Results (from the past 240 hour(s))  Surgical pcr screen     Status: None   Collection Time: 12/27/18 11:37 PM   Specimen: Nasal Mucosa; Nasal Swab  Result Value Ref Range Status   MRSA, PCR NEGATIVE NEGATIVE Final   Staphylococcus aureus NEGATIVE NEGATIVE Final    Comment: (NOTE) The Xpert SA Assay (FDA approved for NASAL specimens in patients 39 years of age and older), is one component of a comprehensive surveillance program. It is not intended to diagnose infection nor to guide or monitor treatment. Performed at [facility], [address], [place], [place]       Studies: CT IMAGE GUIDED DRAINAGE BY PERCUTANEOUS CATHETER  Result Date: 01/04/2019 CLINICAL DATA:  Postoperative right-sided peritoneal fluid collection/abscess after colectomy for colonic perforation. EXAM: CT GUIDED CATHETER DRAINAGE OF PERITONEAL ABSCESS ANESTHESIA/SEDATION: 1.5 mg IV Versed 125 mcg IV Fentanyl Total Moderate Sedation Time:  21 minutes [name] level of consciousness and physiologic status were continuously monitored during the procedure by Radiology nursing. PROCEDURE: The procedure, risks, benefits, and alternatives were explained to the patient. Questions regarding the procedure were encouraged and answered. The patient understands and consents to the procedure. A time out was performed prior to initiating the procedure. CT was performed in a supine position through the mid to lower abdomen. The right abdominal wall was prepped with chlorhexidine in a sterile fashion,  and a sterile drape was applied covering the operative field. A sterile gown and sterile gloves were used for the procedure. Local anesthesia was provided with 1% Lidocaine. An 18 gauge trocar needle was advanced into a right lateral peritoneal fluid collection. After return of fluid, a guidewire was advanced into the collection. The tract was dilated and a 12 French percutaneous drainage catheter placed. Catheter position was confirmed by CT. A fluid sample was withdrawn from the catheter and sent for culture analysis. The catheter was attached to a suction bulb. The catheter was secured at the skin with a Prolene retention suture and StatLock device. COMPLICATIONS: None FINDINGS: Large right lateral peritoneal fluid collection again noted. Aspiration yielded thin, serosanguineous fluid. After placement of the drainage catheter, there is abundant return of fluid from the drain. IMPRESSION: CT-guided percutaneous catheter drainage of right lateral peritoneal fluid collection yielding serosanguineous fluid. A sample was sent for culture analysis. A 12 French drain was placed and attached to suction bulb drainage.  Electronically Signed   By: Irish LackGlenn  Yamagata M.D.   On: 01/04/2019 15:40    Scheduled Meds: . chlorhexidine  15 mL Mouth Rinse BID  . Chlorhexidine Gluconate Cloth  6 each Topical Daily  . digoxin  0.125 mg Intravenous Daily  . fentaNYL      . furosemide  40 mg Intravenous BID  . insulin aspart  0-20 Units Subcutaneous Q4H  . lidocaine      . mouth rinse  15 mL Mouth Rinse q12n4p  . midazolam      . pantoprazole (PROTONIX) IV  40 mg Intravenous Q24H  . sodium chloride flush  10-40 mL Intracatheter Q12H    Continuous Infusions: . sodium chloride Stopped (12/20/18 1319)  . sodium chloride    . amiodarone    . fluconazole (DIFLUCAN) IV Stopped (01/03/19 1528)  . heparin 3,000 Units/hr (01/04/19 1809)  . methocarbamol (ROBAXIN) IV 500 mg (01/04/19 16100608)  . piperacillin-tazobactam (ZOSYN)   IV 3.375 g (01/04/19 0550)  . TPN ADULT (ION) 80 mL/hr at 01/04/19 1802     LOS: 15 days     Darlin Droparole N Huie Ghuman, MD [facility] Pager [phone]  If 7PM-7AM, please contact night-coverage [email or web address] Password [email or web address] 01/04/2019, 8:32 PM

## 2019-01-04 NOTE — Progress Notes (Signed)
Inpatient Rehabilitation Admissions Coordinator  Inpatient rehab consult received. I will emet with pt to begin discussions concerning a possible inpt rehab admit.  Danne Baxter, RN, MSN Rehab Admissions Coordinator [phone] 01/04/2019 12:20 PM

## 2019-01-04 NOTE — Progress Notes (Signed)
[place] for Heparin  Indication: hx VTE, LV mural thrombus, afib   Allergies  Allergen Reactions  . Shrimp [Shellfish Allergy] Hives and Itching    Patient Measurements: Height: 5\' 7"  (170.2 cm) Weight: (!) 345 lb 14.4 oz (156.9 kg) IBW/kg (Calculated) : 66.1 Heparin Dosing Weight:  101.6 kg  Vital Signs: Temp: 98.9 F (37.2 C) (12/22 1715) Temp Source: Oral (12/22 1715) BP: 142/74 (12/22 1715) Pulse Rate: 83 (12/22 1715)  Labs: Recent Labs    01/02/19 0100 01/03/19 0521 01/03/19 1400 01/04/19 0415 01/04/19 1457  HGB 9.8* 9.5*  --  9.5*  --   HCT 30.8* 30.2*  --  31.0*  --   PLT 144* 156  --  201  --   HEPARINUNFRC  --  <0.10* 0.29* 0.21* <0.10*  CREATININE 1.20 1.23  --  1.30*  --     Estimated Creatinine Clearance: 95.2 mL/min (A) (by C-G formula based on SCr of 1.3 mg/dL (H)).   Assessment: 53 y.o. male with h/o Afib and LV thrombus, Eliquis on hold, for IV heparin.  Heparin level is just below goal at 0.29 on 2600 units/hr. No bleeding noted, Hgb 9.5 and stable, platelets are normal. Heparin infusing in left wrist, level drawn from PICC so should be accurate.  12/22 PM update: Heparin level undetectable this afternoon, but heparin was turned off for IR procedure.  Spoke with Dr. Kathlene Cote, okay to resume heparin now.  Goal of Therapy:  Heparin level 0.3-0.7 units/ml Monitor platelets by anticoagulation protocol: Yes   Plan:  Restart heparin drip to 3000 units/hr Re-check heparin level in 6  hours Daily heparin level and CBC Monitor for s/sx of bleeding  Marguerite Olea, [facility] Clinical Pharmacist Phone [phone]  01/04/2019 5:42 PM     **Pharmacist phone directory can be found on [email or web address] listed under [place]**

## 2019-01-04 NOTE — Progress Notes (Signed)
[facility] Progress Note  7 Days Post-Op  Subjective: Patient reports abdominal pain is controlled currently but was severe at times overnight. Denies nausea but belching a lot. Having some ileostomy output. Patient reports RN had some concerns about wound with dressing change this AM around 0500.  Objective: Vital signs in last 24 hours: Temp:  [98.5 F (36.9 C)-100.3 F (37.9 C)] 100.3 F (37.9 C) (12/22 0359) Pulse Rate:  [74-102] 84 (12/22 0359) Resp:  [20-35] 23 (12/22 0359) BP: (95-122)/(53-77) 106/65 (12/22 0359) SpO2:  [93 %-96 %] 95 % (12/22 0359) Weight:  [156.9 kg] 156.9 kg (12/21 1610) Last BM Date: 01/03/19  Intake/Output from previous day: 12/21 0701 - 12/22 0700 In: 3033.9 [P.O.:500; I.V.:2079.5; IV Piggyback:454.5] Out: 3630 [Urine:3250; Drains:220; Stool:160] Intake/Output this shift: Total I/O In: -  Out: 550 [Urine:550]  PE: Gen:  Alert, NAD, pleasant Card:  Regular rate and rhythm Pulm:  Tachypnea, rales in bases bilaterally  Abd: Soft, appropriately ttp, stoma pink and viable, drain in LLQ with SS fluid, midline wound as below with inferior tract that is ~6 cm deep and tissue bridge superiorly, fascia appears necrotic superiorly with wound dehiscence but no evisceration  Skin: warm and dry, no rashes  Psych: A&Ox3   Lab Results:  Recent Labs    01/03/19 0521 01/04/19 0415  WBC 20.3* 18.9*  HGB 9.5* 9.5*  HCT 30.2* 31.0*  PLT 156 201   BMET Recent Labs    01/03/19 0521 01/04/19 0415  NA 143 141  K 4.1 4.2  CL 113* 109  CO2 22 22  GLUCOSE 211* 214*  BUN 17 19  CREATININE 1.23 1.30*  CALCIUM 7.6* 7.7*   PT/INR No results for input(s): LABPROT, INR in the last 72 hours. CMP     Component Value Date/Time   NA 141 01/04/2019 0415   NA 140 08/26/2018 1355   K 4.2 01/04/2019 0415   CL 109 01/04/2019 0415   CO2 22 01/04/2019 0415   GLUCOSE 214 (H) 01/04/2019 0415   BUN 19 01/04/2019 0415   BUN 14 08/26/2018 1355   CREATININE 1.30 (H) 01/04/2019 0415   CREATININE 1.20 10/29/2016 1236   CALCIUM 7.7 (L) 01/04/2019 0415   PROT 5.3 (L) 01/04/2019 0415   PROT 7.4 12/03/2018 1459   ALBUMIN 1.2 (L) 01/04/2019 0415   ALBUMIN 3.6 (L) 12/03/2018 1459   AST 29 01/04/2019 0415   ALT 17 01/04/2019 0415   ALKPHOS 39 01/04/2019 0415   BILITOT 1.0 01/04/2019 0415   BILITOT 0.3 12/03/2018 1459   GFRNONAA >60 01/04/2019 0415   GFRAA >60 01/04/2019 0415   Lipase     Component Value Date/Time   LIPASE 27 12/19/2018 2038       Studies/Results: DG Abd 1 View  Result Date: 01/02/2019 CLINICAL DATA:  53 year old male status post total colectomy and ileostomy. Right-sided abdominal pain. EXAM: ABDOMEN - 1 VIEW COMPARISON:  Most recent prior abdominal radiograph 12/27/2018 FINDINGS: Solitary loop of air-filled and distended small bowel present in the left hemiabdomen. The stomach is also partially filled with gas. Elevation of the right hemidiaphragm with associated right basilar atelectasis. Cardiac and mediastinal contours are within normal limits. Incompletely imaged intracardiac defibrillator lead. IMPRESSION: Dilated and air-filled loops of small bowel in the left hemiabdomen likely reflect ileus in the postoperative setting. Electronically Signed   By: Malachy Moan M.D.   On: 01/02/2019 14:09   CT ABDOMEN PELVIS W CONTRAST  Result Date: 01/03/2019 CLINICAL DATA:  Six days  status post subtotal colectomy and ileostomy. Rising white blood cell count. EXAM: CT ABDOMEN AND PELVIS WITH CONTRAST TECHNIQUE: Multidetector CT imaging of the abdomen and pelvis was performed using the standard protocol following bolus administration of intravenous contrast. CONTRAST:  OMNIPAQUE IOHEXOL 300 MG/ML  SOLN COMPARISON:  CT scan 12/27/2018 FINDINGS: Lower chest: Small bilateral pleural effusions with overlying atelectasis. The heart is mildly enlarged but stable. Stable pacer wires. Hepatobiliary: Stable liver lesions. No  new or worrisome lesions. There is mass effect on the right lateral margin of the liver inferiorly likely due to adjacent abscess. Layering gallstones are noted the gallbladder. No common bile duct dilatation. Pancreas: No mass, inflammation or ductal dilatation. Spleen: Normal size.  No focal lesions. Adrenals/Urinary Tract: The adrenal glands and kidneys are unremarkable. Stable scarring changes involving the right renal cortex posteriorly. No hydronephrosis. The bladder is moderately distended Stomach/Bowel: The stomach, duodenum and small bowel are grossly normal. The mid distal small bowel loops are slightly dilated and there are scattered air-fluid levels but contrast is getting through the small bowel and this has more the appearance of an ileus. 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 No complicating features are identified. Subtotal colectomy. The rectum and part of the sigmoid colon are still present with a Hartmann's pouch. No complicating features. Vascular/Lymphatic: The aorta and branch vessels are patent. No atherosclerotic calcifications. Scattered mesenteric and retroperitoneal lymph nodes but no mass or overt adenopathy. Reproductive: The prostate gland and seminal vesicles are unremarkable. Other: There is a large right-sided intra-abdominal abscess measuring 20 x 17 x 7.5 cm. This is along the lower lateral margin of the liver and extends down in right paracolic gutter to just above the iliac crest. It contains gas. Diffuse mesenteric edema but no focal mesenteric fluid collections to suggest other abscesses. JP drainage catheter just above the bladder but no surrounding fluid collections. Musculoskeletal: No significant bony findings. IMPRESSION: 1. Large (20 x 17 x 7.5 cm) intra-abdominal abscess on the right side. 2. Mesenteric edema and a few mesenteric fluid collections but no discrete mesenteric abscess. 3. Right lower quadrant ileostomy and probable mild small-bowel ileus. 4.  Small bilateral pleural effusions and bibasilar atelectasis. 5. Moderate distention of the bladder. 6. Probable cholelithiasis. These results will be called to the ordering clinician or representative by the Radiologist Assistant, and communication documented in the PACS or zVision Dashboard. Electronically Signed   By: Rudie Meyer M.D.   On: 01/03/2019 13:40    Anti-infectives: Anti-infectives (From admission, onward)   Start     Dose/Rate Route Frequency Ordered Stop   12/28/18 1400  fluconazole (DIFLUCAN) IVPB 400 mg     400 mg 100 mL/hr over 120 Minutes Intravenous Every 24 hours 12/28/18 1318     12/20/18 2200  piperacillin-tazobactam (ZOSYN) IVPB 3.375 g     3.375 g 12.5 mL/hr over 240 Minutes Intravenous Every 8 hours 12/20/18 1834     12/20/18 0645  piperacillin-tazobactam (ZOSYN) IVPB 3.375 g  Status:  Discontinued     3.375 g 100 mL/hr over 30 Minutes Intravenous Every 6 hours 12/20/18 0630 12/20/18 1833   12/19/18 2315  piperacillin-tazobactam (ZOSYN) IVPB 3.375 g     3.375 g 100 mL/hr over 30 Minutes Intravenous  Once 12/19/18 2302 12/20/18 0003       Assessment/Plan CHF EF 25-30% Atrial fibrillation Hx DVT/PE LV mural thrombus Gross anticoagulant Morbid obesityBMI 50.63 CKDcreatinine 1.81>>1.44>>1.29>>1.43>>1.54 Dehydration  POD#7STC/ileostomy for diverticular stricture of left colon and perforated R colon  -  TPN,CLD after procedure - path without evidence of malignancy - Increase to TID dressing changes - WBC 18, CT A/P yesterday with large abscess, IR drain to be placed today - mobilize as able   FEN: NPO for IR procedure VTE: heparin gtt ID: IV Zosyn 12/6>>, IV diflucan 12/15>>  LOS: 15 days    Brigid Re , [facility] 01/04/2019, 9:26 AM Please see Amion for pager number during day hours 7:00am-4:30pm

## 2019-01-04 NOTE — TOC Initial Note (Signed)
Transition of Care [facility]) - Initial/Assessment Note    Patient Details  Name: Roberto [name] MRN: [id number] Date of Birth: 05-08-1965  Transition of Care [facility]) CM/SW Contact:    Doy Hutching, LCSWA Phone Number: 01/04/2019, 1:39 PM  Clinical Narrative:                 CSW spoke with pt at bedside. Introduced self, role, reason for visit. Pt amenable to speaking with CSW. CCS MD also visited during assessment. Pt tx from 4N back to 6N. Plan for further procedures- pt aware at this time. We discussed TOC team would follow. Confirmed home address (1 level, ground floor apartment), he lives with his adult daughter [name]. Confirmed PCP and Cardiology team MDs.  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 Pt was ambulating independently and has no DME at home. We discussed recommendation for IP Rehab- he has multiple supports that live locally. TOC team will continue to follow. Pt aware to ask RN for CSW if he has any additional questions.    Expected Discharge Plan: IP Rehab Facility Barriers to Discharge: Continued Medical Work up, English as a second language teacher   Patient Goals and CMS Choice CMS Medicare.gov Compare Post Acute Care list provided to:: (n/a at this time) Choice offered to / list presented to : Patient  Expected Discharge Plan and Services Expected Discharge Plan: IP Rehab Facility In-house Referral: Clinical Social Work Discharge Planning Services: CM Consult Post Acute Care Choice: IP Rehab Living arrangements for the past 2 months: Apartment     Prior Living Arrangements/Services Living arrangements for the past 2 months: Apartment Lives with:: Adult Children Patient language and need for interpreter reviewed:: Yes(no needs) Do you feel safe going back to the place where you live?: Yes      Need for Family Participation in Patient Care: Yes (Comment)(assistance w/ ADL/IADLs) Care giver support  system in place?: Yes (comment)(adult children; siblings)   Criminal Activity/Legal Involvement Pertinent to Current Situation/Hospitalization: No - Comment as needed  Activities of Daily Living Home Assistive Devices/Equipment: None ADL Screening (condition at time of admission) Patient's cognitive ability adequate to safely complete daily activities?: Yes Is the patient deaf or have difficulty hearing?: No Does the patient have difficulty seeing, even when wearing glasses/contacts?: No Does the patient have difficulty concentrating, remembering, or making decisions?: No Patient able to express need for assistance with ADLs?: Yes Does the patient have difficulty dressing or bathing?: No Independently performs ADLs?: Yes (appropriate for developmental age) Does the patient have difficulty walking or climbing stairs?: No Weakness of Legs: None Weakness of Arms/Hands: None  Permission Sought/Granted Permission sought to share information with : Family Supports Permission granted to share information with : Yes, Verbal Permission Granted  Share Information with NAME: Neshawn Aird (and all others on facesheet)  Permission granted to share info w AGENCY: IP rehab  Permission granted to share info w Relationship: daughter  Permission granted to share info w Contact Information: [phone]  Emotional Assessment Appearance:: Appears stated age Attitude/Demeanor/Rapport: Engaged, Gracious Affect (typically observed): Accepting, Adaptable, Appropriate, Pleasant Orientation: : Oriented to Situation, Oriented to  Time, Oriented to Place, Oriented to Self Alcohol / Substance Use: Not Applicable Psych Involvement: No (comment)(unaware of any needs)  Admission diagnosis:  Right lower quadrant abdominal pain [R10.31] Acute diverticulitis [K57.92] Leukocytosis, unspecified type [D72.829] Patient Active Problem List   Diagnosis Date Noted  . Respiratory failure (HCC)   .  Bowel obstruction  ([place])   . Pre-operative cardiovascular examination   . Acute diverticulitis 12/20/2018  . 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 History of DVT (deep vein thrombosis) 09/04/2018  . PAF (paroxysmal atrial fibrillation) ([place]) 09/04/2018  . Rash 05/06/2018  . Elevated uric acid in blood 05/06/2018  . Atypical atrial flutter ([place]) 03/17/2017  . Chronic pain of right knee 03/13/2017  . Typical atrial flutter s/p catheter ablation   . Varicose veins of legs 12/17/2015  . Encounter for health maintenance examination in adult 12/17/2015  . Special screening for malignant neoplasms, colon 12/17/2015  . Vaccine counseling 12/17/2015  . Screening for prostate cancer 12/17/2015  . Knee deformity, acquired, right 12/17/2015  . Gait disturbance 12/17/2015  . Osteoarthritis of right knee 12/17/2015  . Varicose veins of lower extremities with ulcer ([place]) 09/18/2015  . Need for prophylactic vaccination and inoculation against influenza 09/18/2015  . Tinea versicolor 06/15/2015  . Impaired fasting blood sugar 12/28/2014  . Long term current use of anticoagulant therapy 12/28/2014  . Erectile dysfunction 02/19/2012  . Chronic systolic heart failure ([place]) 12/29/2011  . Hypokalemia 12/24/2011  . Lupus anticoagulant positive 12/22/2011  . Nonischemic cardiomyopathy ([place]) 12/16/2011  . Paroxysmal SVT (supraventricular tachycardia) ([place]) 12/16/2011  . History of venous thromboembolism 12/16/2011  . Venous insufficiency 11/04/2011  . Obesity, morbid ([place]) 06/02/2008   PCP:  Denita Lung, MD Pharmacy:   [id number] - [place], [place] - [address] AT [place] [address] [place] [address] Phone: [phone] Fax: [phone]    Readmission Risk Interventions Readmission Risk Prevention Plan 01/04/2019  Transportation Screening Complete  PCP or Specialist Appt within 3-5 Days Not Complete  Not Complete comments pt  not stable for dc at this time  [place] or [place] Complete  Social Work Consult for [place] Planning/Counseling Complete  Palliative Care Screening Not Applicable  Medication Review Press photographer) Referral to Pharmacy  Some recent data might be hidden

## 2019-01-04 NOTE — Progress Notes (Signed)
[place] for Heparin  Indication: hx VTE, LV mural thrombus, afib   Allergies  Allergen Reactions  . Shrimp [Shellfish Allergy] Hives and Itching    Patient Measurements: Height: 5\' 7"  (170.2 cm) Weight: (!) 345 lb 14.4 oz (156.9 kg) IBW/kg (Calculated) : 66.1 Heparin Dosing Weight:  101.6 kg  Vital Signs: Temp: 100.3 F (37.9 C) (12/22 0359) Temp Source: Oral (12/22 0359) BP: 106/65 (12/22 0359) Pulse Rate: 84 (12/22 0359)  Labs: Recent Labs    01/02/19 0100 01/03/19 0521 01/03/19 1400 01/04/19 0415  HGB 9.8* 9.5*  --  9.5*  HCT 30.8* 30.2*  --  31.0*  PLT 144* 156  --  201  HEPARINUNFRC  --  <0.10* 0.29* 0.21*  CREATININE 1.20 1.23  --  1.30*    Estimated Creatinine Clearance: 95.2 mL/min (A) (by C-G formula based on SCr of 1.3 mg/dL (H)).   Assessment: 53 y.o. male with h/o Afib and LV thrombus, Eliquis on hold, for IV heparin.  Heparin level is just below goal at 0.29 on 2600 units/hr. No bleeding noted, Hgb 9.5 and stable, platelets are normal. Heparin infusing in left wrist, level drawn from PICC so should be accurate.  12/22 AM update:  Heparin level low  No issues per RN  Goal of Therapy:  Heparin level 0.3-0.7 units/ml Monitor platelets by anticoagulation protocol: Yes   Plan:  Increase heparin drip to 3000 units/hr Re-check heparin level in 6-8 hours Daily heparin level and CBC Monitor for s/sx of bleeding  Narda Bonds, PharmD, BCPS Clinical Pharmacist Phone: [phone]   **Pharmacist phone directory can be found on [email or web address] listed under [place]**

## 2019-01-04 NOTE — Progress Notes (Addendum)
Physical Therapy Treatment Patient Details Name: Roberto [name] MRN: [id number] DOB: 1965-04-14 Today's Date: 01/04/2019    History of Present Illness Pt is a 53 y.o. M with history of NICM, EF 25-30%, PSVT, PAF, chronic systolic HF with AICD, DVT/PE/LV thrombus , on chronic anticoagulation who was admitted 12/20/2018 for acute diverticulitis. He was initially treated conservatively, but had no real improvement and was taken to the OR 12/28/2018.Pt was found to have a perforated cecum with leakage of stool throughout abdominal cavity. 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 He was brought from the OR to NICU intubated and on pressors. Extubated 12/16.    PT Comments    Pt pleasant and was willing to mobilize despite continued pain after medication. Pt reports lower right abdominal pain and awaiting further IR draining. Pt with improved bed mobility with HOB elevated and use of rail for transition to EOB with continued max +2 assist to return to bed. Deferred OOB due to awaiting IR. Pt very motivated to progress and limited by pain. Pt educated for HEP and encouraged to continue to perform throughout the day.   HR 84-97   Follow Up Recommendations  CIR     Equipment Recommendations  Other (comment)(TBD with progression)    Recommendations for Other Services       Precautions / Restrictions Precautions Precautions: Fall;Other (comment) Precaution Comments: colostomy, JP drain    Mobility  Bed Mobility Overal bed mobility: Needs Assistance Bed Mobility: Supine to Sit;Sit to Supine     Supine to sit: Mod assist;HOB elevated Sit to supine: Max assist;+2 for physical assistance   General bed mobility comments: mod assist with HOB elevated 35 degrees with use of rail to pivot toward left of bed. Return to bed with max +2 to elevate legs to surface and control descent  Transfers Overall transfer level: Needs assistance Equipment used: Rolling walker (2 wheeled) Transfers:  Sit to/from Stand Sit to Stand: Mod assist;+2 physical assistance         General transfer comment: mod +2 to stand from bed x 2 trials. Pt limited by pain and initially only stood grossly 5 sec. After seated rest and RN present to state no further pain meds available due to premedication pt able to stand again and take 3 steps toward [facility] with RW and cues for sequence with assist to move RW. deferred OOB today due to pt awaiting IR procedure  Ambulation/Gait                 Stairs             Wheelchair Mobility    Modified Rankin (Stroke Patients Only)       Balance Overall balance assessment: Needs assistance   Sitting balance-Leahy Scale: Good     Standing balance support: Bilateral upper extremity supported Standing balance-Leahy Scale: Poor Standing balance comment: bil UE reliance on RW                            Cognition Arousal/Alertness: Awake/alert Behavior During Therapy: WFL for tasks assessed/performed Overall Cognitive Status: Within Functional Limits for tasks assessed                                        Exercises General Exercises - Lower Extremity Short Arc Quad: Both;10 reps;Supine;AROM Heel Slides: Both;Supine;10 reps;AROM Hip ABduction/ADduction: AAROM;Both;Supine;10  reps    General Comments        Pertinent Vitals/Pain Faces Pain Scale: Hurts whole lot Pain Location: 8/10 at rest and 10/10 with standing Pain Descriptors / Indicators: Grimacing;Guarding;Pressure Pain Intervention(s): Limited activity within patient's tolerance;Monitored during session;Premedicated before session;Repositioned    Home Living                      Prior Function            PT Goals (current goals can now be found in the care plan section) Progress towards PT goals: Progressing toward goals    Frequency    Min 3X/week      PT Plan Current plan remains appropriate    Co-evaluation               AM-PAC PT "6 Clicks" Mobility   Outcome Measure  Help needed turning from your back to your side while in a flat bed without using bedrails?: A Lot Help needed moving from lying on your back to sitting on the side of a flat bed without using bedrails?: Total Help needed moving to and from a bed to a chair (including a wheelchair)?: Total Help needed standing up from a chair using your arms (e.g., wheelchair or bedside chair)?: Total Help needed to walk in hospital room?: Total Help needed climbing 3-5 steps with a railing? : Total 6 Click Score: 7    End of Session Equipment Utilized During Treatment: Gait belt Activity Tolerance: Patient tolerated treatment well;Patient limited by pain Patient left: in bed;with call bell/phone within reach Nurse Communication: Mobility status PT Visit Diagnosis: Other abnormalities of gait and mobility (R26.89);Pain;Muscle weakness (generalized) (M62.81)     Time: 5284-1324 PT Time Calculation (min) (ACUTE ONLY): 23 min  Charges:  $Therapeutic Exercise: 8-22 mins $Therapeutic Activity: 8-22 mins                     Evelen Vazguez P, PT Acute Rehabilitation Services Pager: [phone] Office: [phone]    Mays Paino B Nedda Gains 01/04/2019, 1:17 PM

## 2019-01-04 NOTE — Procedures (Signed)
Interventional Radiology Procedure Note  Procedure: CT Guided Drainage of Peritoneal Abscess  Complications: None  Estimated Blood Loss: < 10 mL  Findings: 12 Fr drain placed in right lateral peritoneal abscess with return of blood-tinged fluid. Fluid sample sent for culture analysis. Drain attached to suction bulb drainage.  Will follow.  Venetia Night. Kathlene Cote, M.D Pager:  [phone]

## 2019-01-04 NOTE — Progress Notes (Addendum)
Progress Note  Patient Name: Roberto [name] Date of Encounter: 01/04/2019  Primary Cardiologist: Arvilla Meres, MD   Subjective   Denies any chest pain or SOB.  Weight up significantly and now with LE edema  Inpatient Medications    Scheduled Meds:  chlorhexidine  15 mL Mouth Rinse BID   Chlorhexidine Gluconate Cloth  6 each Topical Daily   digoxin  0.125 mg Intravenous Daily   insulin aspart  0-20 Units Subcutaneous Q4H   mouth rinse  15 mL Mouth Rinse q12n4p   pantoprazole (PROTONIX) IV  40 mg Intravenous Q24H   sodium chloride flush  10-40 mL Intracatheter Q12H   Continuous Infusions:  sodium chloride Stopped (12/20/18 1319)   sodium chloride     fluconazole (DIFLUCAN) IV Stopped (01/03/19 1528)   heparin 3,000 Units/hr (01/04/19 0550)   methocarbamol (ROBAXIN) IV 500 mg (01/04/19 1410)   piperacillin-tazobactam (ZOSYN)  IV 3.375 g (01/04/19 0550)   TPN ADULT (ION) 80 mL/hr at 01/04/19 0300   PRN Meds: sodium chloride, acetaminophen, alum & mag hydroxide-simeth, HYDROmorphone (DILAUDID) injection, morphine injection, ondansetron (ZOFRAN) IV, sodium chloride flush   Vital Signs    Vitals:   01/03/19 2046 01/03/19 2231 01/04/19 0116 01/04/19 0359  BP: 95/60 110/64 (!) 111/55 106/65  Pulse: 85 80 84 84  Resp: 20  (!) 22 (!) 23  Temp: 99.7 F (37.6 C)  99.5 F (37.5 C) 100.3 F (37.9 C)  TempSrc: Oral  Oral Oral  SpO2: 96%  95% 95%  Weight:      Height:        Intake/Output Summary (Last 24 hours) at 01/04/2019 0744 Last data filed at 01/04/2019 3013 Gross per 24 hour  Intake 3033.9 ml  Output 4180 ml  Net -1146.1 ml   Last 3 Weights 01/03/2019 12/31/2018 12/30/2018  Weight (lbs) 345 lb 14.4 oz 321 lb 10.4 oz 324 lb 8.3 oz  Weight (kg) 156.9 kg 145.9 kg 147.2 kg      Telemetry    NSR with frequent ventricular ectopy and NSVT vs. Atrial tachy with aberration - Personally Reviewed  ECG    No new EKG to review - Personally  Reviewed  Physical Exam   GEN: Well nourished, well developed in no acute distress HEENT: Normal NECK: No JVD; No carotid bruits LYMPHATICS: No lymphadenopathy CARDIAC:RRR, no murmurs, rubs, gallops RESPIRATORY:  Clear to auscultation without rales, wheezing or rhonchi anteriorly ABDOMEN: Soft, non-tender, non-distended MUSCULOSKELETAL:  2-3+ LE pitting  edema; No deformity  SKIN: Warm and dry NEUROLOGIC:  Alert and oriented x 3 PSYCHIATRIC:  Normal affect    Labs    High Sensitivity Troponin:  No results for input(s): TROPONINIHS in the last 720 hours.    Chemistry Recent Labs  Lab 12/30/18 0558 12/31/18 0451 01/02/19 0100 01/03/19 0521 01/04/19 0415  NA 145 144 144 143 141  K 3.8 3.7 3.7 4.1 4.2  CL 110 110 112* 113* 109  CO2 25 27 24 22 22   GLUCOSE 144* 157* 191* 211* 214*  BUN 13 11 16 17 19   CREATININE 1.35* 1.25* 1.20 1.23 1.30*  CALCIUM 8.0* 7.7* 7.8* 7.6* 7.7*  PROT 5.2*  --   --  4.9* 5.3*  ALBUMIN 1.9* 1.5*  --  1.2* 1.2*  AST 25  --   --  26 29  ALT 18  --   --  16 17  ALKPHOS 27*  --   --  27* 39  BILITOT 1.0  --   --  0.5 1.0  GFRNONAA 60* >60 >60 >60 >60  GFRAA >60 >60 >60 >60 >60  ANIONGAP 10 7 8 8 10      Hematology Recent Labs  Lab 01/02/19 0100 01/03/19 0521 01/04/19 0415  WBC 15.2* 20.3* 18.9*  RBC 3.36* 3.24* 3.31*  HGB 9.8* 9.5* 9.5*  HCT 30.8* 30.2* 31.0*  MCV 91.7 93.2 93.7  MCH 29.2 29.3 28.7  MCHC 31.8 31.5 30.6  RDW 14.7 15.3 15.8*  PLT 144* 156 201    BNPNo results for input(s): BNP, PROBNP in the last 168 hours.   DDimer No results for input(s): DDIMER in the last 168 hours.   Radiology    DG Abd 1 View  Result Date: 01/02/2019 CLINICAL DATA:  53 year old male status post total colectomy and ileostomy. Right-sided abdominal pain. EXAM: ABDOMEN - 1 VIEW COMPARISON:  Most recent prior abdominal radiograph 12/27/2018 FINDINGS: Solitary loop of air-filled and distended small bowel present in the left hemiabdomen. The  stomach is also partially filled with gas. Elevation of the right hemidiaphragm with associated right basilar atelectasis. Cardiac and mediastinal contours are within normal limits. Incompletely imaged intracardiac defibrillator lead. IMPRESSION: Dilated and air-filled loops of small bowel in the left hemiabdomen likely reflect ileus in the postoperative setting. Electronically Signed   By: Jacqulynn Cadet M.D.   On: 01/02/2019 14:09   CT ABDOMEN PELVIS W CONTRAST  Result Date: 01/03/2019 CLINICAL DATA:  Six days status post subtotal colectomy and ileostomy. Rising white blood cell count. EXAM: CT ABDOMEN AND PELVIS WITH CONTRAST TECHNIQUE: Multidetector CT imaging of the abdomen and pelvis was performed using the standard protocol following bolus administration of intravenous contrast. CONTRAST:  15mL OMNIPAQUE IOHEXOL 300 MG/ML  SOLN COMPARISON:  CT scan 12/27/2018 FINDINGS: Lower chest: Small bilateral pleural effusions with overlying atelectasis. The heart is mildly enlarged but stable. Stable pacer wires. Hepatobiliary: Stable liver lesions. No new or worrisome lesions. There is mass effect on the right lateral margin of the liver inferiorly likely due to adjacent abscess. Layering gallstones are noted the gallbladder. No common bile duct dilatation. Pancreas: No mass, inflammation or ductal dilatation. Spleen: Normal size.  No focal lesions. Adrenals/Urinary Tract: The adrenal glands and kidneys are unremarkable. Stable scarring changes involving the right renal cortex posteriorly. No hydronephrosis. The bladder is moderately distended Stomach/Bowel: The stomach, duodenum and small bowel are grossly normal. The mid distal small bowel loops are slightly dilated and there are scattered air-fluid levels but contrast is getting through the small bowel and this has more the appearance of an ileus. 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 No complicating features are identified. Subtotal colectomy.  The rectum and part of the sigmoid colon are still present with a Hartmann's pouch. No complicating features. Vascular/Lymphatic: The aorta and branch vessels are patent. No atherosclerotic calcifications. Scattered mesenteric and retroperitoneal lymph nodes but no mass or overt adenopathy. Reproductive: The prostate gland and seminal vesicles are unremarkable. Other: There is a large right-sided intra-abdominal abscess measuring 20 x 17 x 7.5 cm. This is along the lower lateral margin of the liver and extends down in right paracolic gutter to just above the iliac crest. It contains gas. Diffuse mesenteric edema but no focal mesenteric fluid collections to suggest other abscesses. JP drainage catheter just above the bladder but no surrounding fluid collections. Musculoskeletal: No significant bony findings. IMPRESSION: 1. Large (20 x 17 x 7.5 cm) intra-abdominal abscess on the right side. 2. Mesenteric edema and a few mesenteric fluid collections but  no discrete mesenteric abscess. 3. Right lower quadrant ileostomy and probable mild small-bowel ileus. 4. Small bilateral pleural effusions and bibasilar atelectasis. 5. Moderate distention of the bladder. 6. Probable cholelithiasis. These results will be called to the ordering clinician or representative by the Radiologist Assistant, and communication documented in the PACS or zVision Dashboard. Electronically Signed   By: Rudie Meyer M.D.   On: 01/03/2019 13:40    Cardiac Studies   Echocardiogram 07/20/2018 IMPRESSIONS  1. The left ventricle has severely reduced systolic function, with an ejection fraction of 25-30%. The cavity size was moderately dilated. Left ventricular diastolic Doppler parameters are consistent with pseudonormalization. Left ventricular diffuse  hypokinesis. 2. The right ventricle has normal systolic function. The cavity was normal. There is no increase in right ventricular wall thickness. 3. Left atrial size was mildly  dilated. 4. No evidence of mitral valve stenosis. Trivial mitral regurgitation. 5. The aortic valve is tricuspid. No stenosis of the aortic valve. 6. The aortic root is normal in size and structure. 7. The inferior vena cava was dilated in size with <50% respiratory variability. No complete TR doppler jet so unable to estimate PA systolic pressure.  Patient Profile     53 y.o. male  with a hx of chronic diastolic HF,h/o ofLv thrombus, nonischemic cardiomyopathy (EF 25-30% 2013, EF 2020 25-30%), ICD placed 08/2018, PSVT (elected for medical therapy over RFA), DVT/PE, Paroxysmal afib on amiodarone, Aflutter ablation 11/2016, lupus, and morbid obesityinitially seen for preop evaluation in setting of abdominal pain/acute diverticulitis. Followed for hx of CHF.  Assessment & Plan    Acute diverticulitis/Colon Mass/ Sepsis Patient was admitted 12/7 for acute diverticulitis and was being treated medically but did not improve. 12/15 he was taken to the OR found to have perforated cecum and surgery found a mass. In the OR patient became hypotensive and required pressors. Patient was intubated and transferred to ICU for sepsis, place on IV abx. Patient was extubated 12/16. - Path from colon mass shows benign colonic mucosa with mild reactive changes, no active inflammation, no malignancy - Still on TPN - Off pressors - Management per CCM/IM - going back to OR today for drainage of large abcess  Chronic systolic HF (LVEF 25-30%) - Followed by Advanced Heart Failure team - At baseline was on Entresto, spironolactone, and Torsemide. All were held during admission - Digoxin was continued. Level today 0.5. - creatinine has been stable, Today 1.23 - Since admission+5.y L. Fluid intake by TPN  - Weight up 24lbs from 12/18 to 345lbs  ? If accurate - His weight at last AHF clinic a year ago was 301lbs and with EP last month was 333lbs (felt to be euvolemic at that time) and is 345lbs today likely  from all the volume with TPN - he appears volume overloaded on exam with marked LE edema - going back to OR today for drainage of abcess - will start Lasix 40mg  IV BID - follow I&Os and daily weights - will hold on restarting Entresto at this time as BP is soft and not taking PO yet   Paroxysmal Afib - Had Aflutter ablation 2018 - On amiodarone at home.  - Eliquis for stroke prevention>>was held for surgery and placed on heparin.  - change Heparin back to Eliquis when OK the surgery - Patient has been maintaining NSR  but having a lot of ventricular ectopy  Ventricular ectopy - PVCs and short runs of NSVT vs. PAT with aberration - K+ 4.2 and Mag 2.1 -  since not taking PO and had been on Amio at home, will start IV Amio gtt for suppression of possible atrial tach with aberration vs. VT and change back to home dose of Amio once taking PO  I have spent a total of 35 minutes with patient reviewing notes , telemetry, EKGs, labs and examining patient as well as establishing an assessment and plan that was discussed with the patient.  > 50% of time was spent in direct patient care.     For questions or updates, please contact [facility] Please consult [email or web address] for contact info under        Signed, Armanda Magicraci Gregory Dowe, MD  01/04/2019, 7:44 AM

## 2019-01-04 NOTE — Progress Notes (Addendum)
PHARMACY - TOTAL PARENTERAL NUTRITION CONSULT NOTE   Indication: massive bowel resection, prolonged NPO  Patient Measurements: Height: 5\' 7"  (170.2 cm) Weight: (!) 345 lb 14.4 oz (156.9 kg) IBW/kg (Calculated) : 66.1 TPN AdjBW (KG): 86.1 Body mass index is 54.18 kg/m.  Assessment:  79 yom presented to the hospital with abdominal pain found to be acute diverticulitis. He was treated conservatively but without improvement. He was taken to the OR 12/15 and found to have a perforated cecum with leakage of stool throughout the abdominal cavity. He remains on the ventilator with vasopressors. Patient has been NPO or clear liquids since admission so he may be at risk for refeeding syndrome.   Glucose / Insulin: A1c 6.3, CBGs uncontrolled at ~ 200s requiring 24 units SSI last 24 hrs. Electrolytes: Lytes WNL Renal: SCr 1.3 LFTs / TGs: LFTs WNL, TG 101 Prealbumin / albumin: prealbumin 6.1, albumin 1.2 (stable) Intake / Output; MIVF: drain O/P 350 ml, ileostomy O/P 375 ml, net +6.6L GI Imaging: 12/14 Abd xray: Persistent bowel dilatation. No free air demonstrable 12/14 CT abd: Worsening distal large-bowel obstruction at the level of the descending colon correlating with an approximately 5 cm in length segment of irregular annular wall thickening with underlying colonic diverticulosis and mild pericolonic fat stranding 12/9 Abd xray: Worsening gaseous distension of the proximal colon and now involving much of the small bowel Surgeries / Procedures:  12/22 IR drainage of right peritoneal abscess 12/15 Exlap, subtotal colectomy and end ileostomy 12/10 Flexible sigmoidoscopy  Central access: PICC 12/15 TPN start date: 12/16  Nutritional Goals (per RD recommendation on 12/17): KCal: 2200-2400, Protein: 130-150, Fluid: >/=1.8 L/day Goal TPN rate is 80 mL/hr (provides 147 g of protein and 2377 kcals per day)  Current Nutrition:  TPN  12/19 CLD started - tolerating per surgery note (NPO For  procedure today)  Plan:  Continue TPN at 50mL/hr - add lipids today as out of ICU Electrolytes in TPN: Na 96 mEq, K 106 mEq, 10 mEq Ca, 13 mEq Mg, 35 mmol Phos (no change today) Add standard MVI MWF and trace elements daily to TPN Continue Resistant q4h SSI and adjust as needed, add 10 units insulin to TPN Monitor TPN labs, Mg Phos in AM  Barth Kirks, PharmD, BCPS, BCCCP Clinical Pharmacist [phone]  Please check AMION for all [place] numbers  01/04/2019 9:46 AM

## 2019-01-05 ENCOUNTER — Inpatient Hospital Stay (HOSPITAL_COMMUNITY): Payer: BC Managed Care – PPO

## 2019-01-05 LAB — COMPREHENSIVE METABOLIC PANEL
ALT: 17 U/L (ref 0–44)
AST: 29 U/L (ref 15–41)
Albumin: 1.2 g/dL — ABNORMAL LOW (ref 3.5–5.0)
Alkaline Phosphatase: 38 U/L (ref 38–126)
Anion gap: 7 (ref 5–15)
BUN: 21 mg/dL — ABNORMAL HIGH (ref 6–20)
CO2: 25 mmol/L (ref 22–32)
Calcium: 7.9 mg/dL — ABNORMAL LOW (ref 8.9–10.3)
Chloride: 112 mmol/L — ABNORMAL HIGH (ref 98–111)
Creatinine, Ser: 1.3 mg/dL — ABNORMAL HIGH (ref 0.61–1.24)
GFR calc Af Amer: >60 mL/min (ref >60)
GFR calc non Af Amer: >60 mL/min (ref >60)
Glucose, Bld: 185 mg/dL — ABNORMAL HIGH (ref 70–99)
Potassium: 3.9 mmol/L (ref 3.5–5.1)
Sodium: 144 mmol/L (ref 135–145)
Total Bilirubin: 1 mg/dL (ref 0.3–1.2)
Total Protein: 5.4 g/dL — ABNORMAL LOW (ref 6.5–8.1)

## 2019-01-05 LAB — CBC
HCT: 29.6 % — ABNORMAL LOW (ref 39.0–52.0)
Hemoglobin: 9.3 g/dL — ABNORMAL LOW (ref 13.0–17.0)
MCH: 28.4 pg (ref 26.0–34.0)
MCHC: 31.4 g/dL (ref 30.0–36.0)
MCV: 90.5 fL (ref 80.0–100.0)
Platelets: 231 10*3/uL (ref 150–400)
RBC: 3.27 MIL/uL — ABNORMAL LOW (ref 4.22–5.81)
RDW: 15.5 % (ref 11.5–15.5)
WBC: 17.9 10*3/uL — ABNORMAL HIGH (ref 4.0–10.5)
nRBC: 0.4 % — ABNORMAL HIGH (ref 0.0–0.2)

## 2019-01-05 LAB — GLUCOSE, CAPILLARY
Glucose-Capillary: 170 mg/dL — ABNORMAL HIGH (ref 70–99)
Glucose-Capillary: 178 mg/dL — ABNORMAL HIGH (ref 70–99)
Glucose-Capillary: 199 mg/dL — ABNORMAL HIGH (ref 70–99)
Glucose-Capillary: 226 mg/dL — ABNORMAL HIGH (ref 70–99)
Glucose-Capillary: 274 mg/dL — ABNORMAL HIGH (ref 70–99)
Glucose-Capillary: 281 mg/dL — ABNORMAL HIGH (ref 70–99)

## 2019-01-05 LAB — HEPARIN LEVEL (UNFRACTIONATED)
Heparin Unfractionated: 0.29 IU/mL — ABNORMAL LOW (ref 0.30–0.70)
Heparin Unfractionated: 0.58 IU/mL (ref 0.30–0.70)

## 2019-01-05 LAB — PHOSPHORUS: Phosphorus: 3.7 mg/dL (ref 2.5–4.6)

## 2019-01-05 LAB — MAGNESIUM: Magnesium: 1.9 mg/dL (ref 1.7–2.4)

## 2019-01-05 MED ORDER — METHOCARBAMOL 500 MG PO TABS
500.0000 mg | ORAL_TABLET | Freq: Three times a day (TID) | ORAL | Status: DC
  Administered 2019-01-05 – 2019-01-24 (×58): 500 mg via ORAL
  Filled 2019-01-05 (×58): qty 1

## 2019-01-05 MED ORDER — OXYCODONE HCL 5 MG PO TABS
5.0000 mg | ORAL_TABLET | ORAL | Status: DC | PRN
  Administered 2019-01-06: 10 mg via ORAL
  Administered 2019-01-07: 5 mg via ORAL
  Administered 2019-01-07: 22:00:00 10 mg via ORAL
  Administered 2019-01-09 – 2019-01-13 (×5): 5 mg via ORAL
  Filled 2019-01-05 (×4): qty 1
  Filled 2019-01-05: qty 2
  Filled 2019-01-05: qty 1
  Filled 2019-01-05 (×2): qty 2

## 2019-01-05 MED ORDER — TRACE MINERALS CU-MN-SE-ZN 300-55-60-3000 MCG/ML IV SOLN
INTRAVENOUS | Status: DC
  Filled 2019-01-05: qty 985.6

## 2019-01-05 MED ORDER — PRO-STAT SUGAR FREE PO LIQD
30.0000 mL | Freq: Three times a day (TID) | ORAL | Status: DC
  Administered 2019-01-05 – 2019-01-15 (×17): 30 mL via ORAL
  Filled 2019-01-05 (×26): qty 30

## 2019-01-05 MED ORDER — TRACE MINERALS CU-MN-SE-ZN 300-55-60-3000 MCG/ML IV SOLN
INTRAVENOUS | Status: AC
  Filled 2019-01-05: qty 492.8

## 2019-01-05 MED ORDER — MORPHINE SULFATE (PF) 2 MG/ML IV SOLN
2.0000 mg | INTRAVENOUS | Status: DC | PRN
  Administered 2019-01-05 – 2019-01-06 (×3): 2 mg via INTRAVENOUS
  Filled 2019-01-05 (×3): qty 1

## 2019-01-05 MED ORDER — ACETAMINOPHEN 325 MG PO TABS
650.0000 mg | ORAL_TABLET | Freq: Four times a day (QID) | ORAL | Status: DC
  Administered 2019-01-05 – 2019-01-24 (×72): 650 mg via ORAL
  Filled 2019-01-05 (×71): qty 2

## 2019-01-05 MED ORDER — ENSURE ENLIVE PO LIQD
237.0000 mL | Freq: Three times a day (TID) | ORAL | Status: DC
  Administered 2019-01-05 – 2019-01-24 (×45): 237 mL via ORAL

## 2019-01-05 NOTE — Progress Notes (Signed)
[facility] for Heparin  Indication: hx VTE, LV mural thrombus, afib   Allergies  Allergen Reactions  . Shrimp [Shellfish Allergy] Hives and Itching    Patient Measurements: Height: 5\' 7"  (170.2 cm) Weight: (!) 329 lb 2.4 oz (149.3 kg) IBW/kg (Calculated) : 66.1 Heparin Dosing Weight:  101.6 kg  Vital Signs: Temp: 98.5 F (36.9 C) (12/23 1331) Temp Source: Oral (12/23 1331) BP: 101/50 (12/23 1331) Pulse Rate: 83 (12/23 1331)  Labs: Recent Labs    01/03/19 0521 01/04/19 0415 01/04/19 1457 01/05/19 0250 01/05/19 1432  HGB 9.5* 9.5*  --  9.3*  --   HCT 30.2* 31.0*  --  29.6*  --   PLT 156 201  --  231  --   HEPARINUNFRC <0.10* 0.21* <0.10* 0.29* 0.58  CREATININE 1.23 1.30*  --  1.30*  --     Estimated Creatinine Clearance: 92.4 mL/min (A) (by C-G formula based on SCr of 1.3 mg/dL (H)).   Assessment: 52 y.o. male with h/o Afib and LV thrombus, Eliquis on hold, for IV heparin.  Heparin level now therapeutic at 0.58 after rate adjustments.   Goal of Therapy:  Heparin level 0.3-0.7 units/ml Monitor platelets by anticoagulation protocol: Yes   Plan:  -Continue heparin 3200 units/h -Recheck heparin level with am labs   Arrie Senate, PharmD, BCPS Clinical Pharmacist [phone] Please check AMION for all [place] numbers 01/05/2019

## 2019-01-05 NOTE — Progress Notes (Signed)
Heparin level 0.29, still below therapeutic range (typically needs to be 0.3 to participate in PT). Consulted with attending MD via epic secure messenger due to heparin level being so close to being therapeutic, awaiting response. Will follow acutely.   Windell Norfolk, DPT, PN1   Supplemental Physical Therapist [facility]    Pager [phone] Acute Rehab Office [phone]

## 2019-01-05 NOTE — Progress Notes (Signed)
Unable to draw labs from PICC line due to pain when movement is applied.  Patient stated that pain started in the am yesterday and is unable to move it at all without having pain.  I will provide this at morning report with nurse and for provider to assess arm.

## 2019-01-05 NOTE — Progress Notes (Signed)
Referring Physician(s): * No referring provider recorded for this case *  Supervising Physician: Arne Cleveland  Patient Status:  [facility] - In-pt  Chief Complaint: Intra-abdominal fluid collection  Subjective: IV came out.  RN assisting/working to replace.  No complaints related to drain. States he feels much better. +output from colostomy.   Allergies: Shrimp [shellfish allergy]  Medications: Prior to Admission medications   Medication Sig Start Date End Date Taking? Authorizing Provider  acetaminophen (TYLENOL) 650 MG CR tablet Take 1,300 mg by mouth every 8 (eight) hours as needed for pain.   Yes [provider]  amiodarone (PACERONE) 200 MG tablet Take 1 tablet (200 mg total) by mouth daily. 10/18/18  Yes Bensimhon, Shaune Pascal, MD  coconut oil OIL Apply 1 application topically as needed (dry skin).    Yes [provider]  digoxin (LANOXIN) 0.125 MG tablet TAKE 1 TABLET(0.125 MG) BY MOUTH DAILY Patient taking differently: Take 0.125 mg by mouth daily.  11/01/18  Yes Bensimhon, Shaune Pascal, MD  ELIQUIS 5 MG TABS tablet TAKE 1 TABLET(5 MG) BY MOUTH TWICE DAILY Patient taking differently: Take 5 mg by mouth 2 (two) times daily.  03/22/18  Yes Bensimhon, Shaune Pascal, MD  ENTRESTO 49-51 MG TAKE 1 TABLET BY MOUTH TWICE DAILY Patient taking differently: Take 1 tablet by mouth 2 (two) times daily.  09/15/18  Yes Bensimhon, Shaune Pascal, MD  potassium chloride SA (KLOR-CON) 20 MEQ tablet Take 2 tablets (40 mEq total) by mouth 2 (two) times daily. 10/18/18  Yes Bensimhon, Shaune Pascal, MD  spironolactone (ALDACTONE) 25 MG tablet TAKE 1 TABLET(25 MG) BY MOUTH DAILY Patient taking differently: Take 25 mg by mouth daily.  12/03/18  Yes Denita Lung, MD  torsemide (DEMADEX) 20 MG tablet TAKE 2 TABLETS BY MOUTH TWICE DAILY. STOP LASIX Patient taking differently: Take 40 mg by mouth 2 (two) times daily.  06/29/18  Yes Bensimhon, Shaune Pascal, MD     Vital Signs: BP (!) 101/50 (BP Location:  Left Arm)   Pulse 83   Temp 98.5 F (36.9 C) (Oral)   Resp 16   Ht 5\' 7"  (1.702 m)   Wt (!) 329 lb 2.4 oz (149.3 kg)   SpO2 97%   BMI 51.55 kg/m   Physical Exam  NAD, alert Abdomen: drain in place. Insertion site I/c/d.  Thick, purulent, bloody output from drain. Flushes and aspirates easily.   Imaging: DG Abd 1 View  Result Date: 01/02/2019 CLINICAL DATA:  53 year old male status post total colectomy and ileostomy. Right-sided abdominal pain. EXAM: ABDOMEN - 1 VIEW COMPARISON:  Most recent prior abdominal radiograph 12/27/2018 FINDINGS: Solitary loop of air-filled and distended small bowel present in the left hemiabdomen. The stomach is also partially filled with gas. Elevation of the right hemidiaphragm with associated right basilar atelectasis. Cardiac and mediastinal contours are within normal limits. Incompletely imaged intracardiac defibrillator lead. IMPRESSION: Dilated and air-filled loops of small bowel in the left hemiabdomen likely reflect ileus in the postoperative setting. Electronically Signed   By: Jacqulynn Cadet M.D.   On: 01/02/2019 14:09   CT ABDOMEN PELVIS W CONTRAST  Result Date: 01/03/2019 CLINICAL DATA:  Six days status post subtotal colectomy and ileostomy. Rising white blood cell count. EXAM: CT ABDOMEN AND PELVIS WITH CONTRAST TECHNIQUE: Multidetector CT imaging of the abdomen and pelvis was performed using the standard protocol following bolus administration of intravenous contrast. CONTRAST:  12mL OMNIPAQUE IOHEXOL 300 MG/ML  SOLN COMPARISON:  CT scan 12/27/2018 FINDINGS: Lower  chest: Small bilateral pleural effusions with overlying atelectasis. The heart is mildly enlarged but stable. Stable pacer wires. Hepatobiliary: Stable liver lesions. No new or worrisome lesions. There is mass effect on the right lateral margin of the liver inferiorly likely due to adjacent abscess. Layering gallstones are noted the gallbladder. No common bile duct dilatation. Pancreas:  No mass, inflammation or ductal dilatation. Spleen: Normal size.  No focal lesions. Adrenals/Urinary Tract: The adrenal glands and kidneys are unremarkable. Stable scarring changes involving the right renal cortex posteriorly. No hydronephrosis. The bladder is moderately distended Stomach/Bowel: The stomach, duodenum and small bowel are grossly normal. The mid distal small bowel loops are slightly dilated and there are scattered air-fluid levels but contrast is getting through the small bowel and this has more the appearance of an ileus. 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 No complicating features are identified. Subtotal colectomy. The rectum and part of the sigmoid colon are still present with a Hartmann's pouch. No complicating features. Vascular/Lymphatic: The aorta and branch vessels are patent. No atherosclerotic calcifications. Scattered mesenteric and retroperitoneal lymph nodes but no mass or overt adenopathy. Reproductive: The prostate gland and seminal vesicles are unremarkable. Other: There is a large right-sided intra-abdominal abscess measuring 20 x 17 x 7.5 cm. This is along the lower lateral margin of the liver and extends down in right paracolic gutter to just above the iliac crest. It contains gas. Diffuse mesenteric edema but no focal mesenteric fluid collections to suggest other abscesses. JP drainage catheter just above the bladder but no surrounding fluid collections. Musculoskeletal: No significant bony findings. IMPRESSION: 1. Large (20 x 17 x 7.5 cm) intra-abdominal abscess on the right side. 2. Mesenteric edema and a few mesenteric fluid collections but no discrete mesenteric abscess. 3. Right lower quadrant ileostomy and probable mild small-bowel ileus. 4. Small bilateral pleural effusions and bibasilar atelectasis. 5. Moderate distention of the bladder. 6. Probable cholelithiasis. These results will be called to the ordering clinician or representative by the Radiologist  Assistant, and communication documented in the PACS or zVision Dashboard. Electronically Signed   By: Rudie Meyer M.D.   On: 01/03/2019 13:40   CT IMAGE GUIDED DRAINAGE BY PERCUTANEOUS CATHETER  Result Date: 01/04/2019 CLINICAL DATA:  Postoperative right-sided peritoneal fluid collection/abscess after colectomy for colonic perforation. EXAM: CT GUIDED CATHETER DRAINAGE OF PERITONEAL ABSCESS ANESTHESIA/SEDATION: 1.5 mg IV Versed 125 mcg IV Fentanyl Total Moderate Sedation Time:  21 minutes [name] level of consciousness and physiologic status were continuously monitored during the procedure by Radiology nursing. PROCEDURE: The procedure, risks, benefits, and alternatives were explained to the patient. Questions regarding the procedure were encouraged and answered. The patient understands and consents to the procedure. A time out was performed prior to initiating the procedure. CT was performed in a supine position through the mid to lower abdomen. The right abdominal wall was prepped with chlorhexidine in a sterile fashion, and a sterile drape was applied covering the operative field. A sterile gown and sterile gloves were used for the procedure. Local anesthesia was provided with 1% Lidocaine. An 18 gauge trocar needle was advanced into a right lateral peritoneal fluid collection. After return of fluid, a guidewire was advanced into the collection. The tract was dilated and a 12 French percutaneous drainage catheter placed. Catheter position was confirmed by CT. A fluid sample was withdrawn from the catheter and sent for culture analysis. The catheter was attached to a suction bulb. The catheter was secured at the skin with a  Prolene retention suture and StatLock device. COMPLICATIONS: None FINDINGS: Large right lateral peritoneal fluid collection again noted. Aspiration yielded thin, serosanguineous fluid. After placement of the drainage catheter, there is abundant return of fluid from the drain.  IMPRESSION: CT-guided percutaneous catheter drainage of right lateral peritoneal fluid collection yielding serosanguineous fluid. A sample was sent for culture analysis. A 12 French drain was placed and attached to suction bulb drainage. Electronically Signed   By: Irish Lack M.D.   On: 01/04/2019 15:40    Labs:  CBC: Recent Labs    01/02/19 0100 01/03/19 0521 01/04/19 0415 01/05/19 0250  WBC 15.2* 20.3* 18.9* 17.9*  HGB 9.8* 9.5* 9.5* 9.3*  HCT 30.8* 30.2* 31.0* 29.6*  PLT 144* 156 201 231    COAGS: Recent Labs    12/28/18 0447 12/28/18 1410 12/29/18 0500 12/30/18 0447 12/30/18 1639  INR  --  1.5*  --   --   --   APTT 106*  --  29 32 78*    BMP: Recent Labs    01/02/19 0100 01/03/19 0521 01/04/19 0415 01/05/19 0250  NA 144 143 141 144  K 3.7 4.1 4.2 3.9  CL 112* 113* 109 112*  CO2 24 22 22 25   GLUCOSE 191* 211* 214* 185*  BUN 16 17 19  21*  CALCIUM 7.8* 7.6* 7.7* 7.9*  CREATININE 1.20 1.23 1.30* 1.30*  GFRNONAA >60 >60 >60 >60  GFRAA >60 >60 >60 >60    LIVER FUNCTION TESTS: Recent Labs    12/30/18 0558 12/31/18 0451 01/03/19 0521 01/04/19 0415 01/05/19 0250  BILITOT 1.0  --  0.5 1.0 1.0  AST 25  --  26 29 29   ALT 18  --  16 17 17   ALKPHOS 27*  --  27* 39 38  PROT 5.2*  --  4.9* 5.3* 5.4*  ALBUMIN 1.9* 1.5* 1.2* 1.2* 1.2*    Assessment and Plan: Intra-abdominal fluid collection s/p drain placement 12/22 Drain in place and intact today.  Symptoms improved.  Purulent-appearing output.  Culture with rare gram positive rods. WBC 17.9 Afebrile. Continue with flushes and routine care.  IR following.   Electronically Signed: 01/07/19, PA 01/05/2019, 4:22 PM   I spent a total of 15 Minutes at the the patient's bedside AND on the patient's hospital floor or unit, greater than 50% of which was counseling/coordinating care for intra-abdominal fluid collection.

## 2019-01-05 NOTE — Progress Notes (Signed)
Progress Note  Patient Name: Roberto [name] Date of Encounter: 01/05/2019  Primary Cardiologist: Arvilla Meres, MD   Subjective   Denies any chest pain or SOB.  Started on Lasix for volume overload.  He put out 4L yesterday but is still 2.5L +.  Inpatient Medications    Scheduled Meds: . chlorhexidine  15 mL Mouth Rinse BID  . Chlorhexidine Gluconate Cloth  6 each Topical Daily  . digoxin  0.125 mg Intravenous Daily  . furosemide  40 mg Intravenous BID  . insulin aspart  0-20 Units Subcutaneous Q4H  . mouth rinse  15 mL Mouth Rinse q12n4p  . pantoprazole (PROTONIX) IV  40 mg Intravenous Q24H  . sodium chloride flush  10-40 mL Intracatheter Q12H   Continuous Infusions: . sodium chloride Stopped (12/20/18 1319)  . sodium chloride    . amiodarone 30 mg/hr (01/04/19 2333)  . fluconazole (DIFLUCAN) IV Stopped (01/03/19 1528)  . heparin 3,200 Units/hr (01/05/19 1886)  . methocarbamol (ROBAXIN) IV 500 mg (01/05/19 0350)  . piperacillin-tazobactam (ZOSYN)  IV 3.375 g (01/04/19 2334)  . TPN ADULT (ION) 80 mL/hr at 01/04/19 1802   PRN Meds: sodium chloride, acetaminophen, alum & mag hydroxide-simeth, HYDROmorphone (DILAUDID) injection, morphine injection, ondansetron (ZOFRAN) IV, sodium chloride flush   Vital Signs    Vitals:   01/04/19 1715 01/04/19 2150 01/04/19 2350 01/05/19 0536  BP: (!) 142/74 (!) 132/52 (!) 101/56 114/62  Pulse: 83 96 85 80  Resp: 16     Temp: 98.9 F (37.2 C) (!) 101.1 F (38.4 C) 99.5 F (37.5 C) 99 F (37.2 C)  TempSrc: Oral Oral Oral Oral  SpO2: 96% 94% 95% 97%  Weight:    (!) 149.3 kg  Height:        Intake/Output Summary (Last 24 hours) at 01/05/2019 0747 Last data filed at 01/05/2019 0531 Gross per 24 hour  Intake 10 ml  Output 3416 ml  Net -3406 ml   Last 3 Weights 01/05/2019 01/03/2019 12/31/2018  Weight (lbs) 329 lb 2.4 oz 345 lb 14.4 oz 321 lb 10.4 oz  Weight (kg) 149.3 kg 156.9 kg 145.9 kg      Telemetry    NSR with  frequent PVCs, ventricular couplets and salvos - Personally Reviewed  ECG    No new EKG to review - Personally Reviewed  Physical Exam   GEN: Well nourished, well developed in no acute distress HEENT: Normal NECK: No JVD; No carotid bruits LYMPHATICS: No lymphadenopathy CARDIAC:irregularly irregular, no murmurs, rubs, gallops RESPIRATORY:  Clear to auscultation without rales, wheezing or rhonchi  ABDOMEN: Soft non-distended MUSCULOSKELETAL:  2+ LE edema; No deformity  SKIN: Warm and dry NEUROLOGIC:  Alert and oriented x 3 PSYCHIATRIC:  Normal affect    Labs    High Sensitivity Troponin:  No results for input(s): TROPONINIHS in the last 720 hours.    Chemistry Recent Labs  Lab 01/03/19 0521 01/04/19 0415 01/05/19 0250  NA 143 141 144  K 4.1 4.2 3.9  CL 113* 109 112*  CO2 22 22 25   GLUCOSE 211* 214* 185*  BUN 17 19 21*  CREATININE 1.23 1.30* 1.30*  CALCIUM 7.6* 7.7* 7.9*  PROT 4.9* 5.3* 5.4*  ALBUMIN 1.2* 1.2* 1.2*  AST 26 29 29   ALT 16 17 17   ALKPHOS 27* 39 38  BILITOT 0.5 1.0 1.0  GFRNONAA >60 >60 >60  GFRAA >60 >60 >60  ANIONGAP 8 10 7      Hematology Recent Labs  Lab 01/03/19 [phone]  01/04/19 0415 01/05/19 0250  WBC 20.3* 18.9* 17.9*  RBC 3.24* 3.31* 3.27*  HGB 9.5* 9.5* 9.3*  HCT 30.2* 31.0* 29.6*  MCV 93.2 93.7 90.5  MCH 29.3 28.7 28.4  MCHC 31.5 30.6 31.4  RDW 15.3 15.8* 15.5  PLT 156 201 231    BNPNo results for input(s): BNP, PROBNP in the last 168 hours.   DDimer No results for input(s): DDIMER in the last 168 hours.   Radiology    CT ABDOMEN PELVIS W CONTRAST  Result Date: 01/03/2019 CLINICAL DATA:  Six days status post subtotal colectomy and ileostomy. Rising white blood cell count. EXAM: CT ABDOMEN AND PELVIS WITH CONTRAST TECHNIQUE: Multidetector CT imaging of the abdomen and pelvis was performed using the standard protocol following bolus administration of intravenous contrast. CONTRAST:  100mL OMNIPAQUE IOHEXOL 300 MG/ML  SOLN  COMPARISON:  CT scan 12/27/2018 FINDINGS: Lower chest: Small bilateral pleural effusions with overlying atelectasis. The heart is mildly enlarged but stable. Stable pacer wires. Hepatobiliary: Stable liver lesions. No new or worrisome lesions. There is mass effect on the right lateral margin of the liver inferiorly likely due to adjacent abscess. Layering gallstones are noted the gallbladder. No common bile duct dilatation. Pancreas: No mass, inflammation or ductal dilatation. Spleen: Normal size.  No focal lesions. Adrenals/Urinary Tract: The adrenal glands and kidneys are unremarkable. Stable scarring changes involving the right renal cortex posteriorly. No hydronephrosis. The bladder is moderately distended Stomach/Bowel: The stomach, duodenum and small bowel are grossly normal. The mid distal small bowel loops are slightly dilated and there are scattered air-fluid levels but contrast is getting through the small bowel and this has more the appearance of an ileus. 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 No complicating features are identified. Subtotal colectomy. The rectum and part of the sigmoid colon are still present with a Hartmann's pouch. No complicating features. Vascular/Lymphatic: The aorta and branch vessels are patent. No atherosclerotic calcifications. Scattered mesenteric and retroperitoneal lymph nodes but no mass or overt adenopathy. Reproductive: The prostate gland and seminal vesicles are unremarkable. Other: There is a large right-sided intra-abdominal abscess measuring 20 x 17 x 7.5 cm. This is along the lower lateral margin of the liver and extends down in right paracolic gutter to just above the iliac crest. It contains gas. Diffuse mesenteric edema but no focal mesenteric fluid collections to suggest other abscesses. JP drainage catheter just above the bladder but no surrounding fluid collections. Musculoskeletal: No significant bony findings. IMPRESSION: 1. Large (20 x 17 x 7.5  cm) intra-abdominal abscess on the right side. 2. Mesenteric edema and a few mesenteric fluid collections but no discrete mesenteric abscess. 3. Right lower quadrant ileostomy and probable mild small-bowel ileus. 4. Small bilateral pleural effusions and bibasilar atelectasis. 5. Moderate distention of the bladder. 6. Probable cholelithiasis. These results will be called to the ordering clinician or representative by the Radiologist Assistant, and communication documented in the PACS or zVision Dashboard. Electronically Signed   By: Rudie MeyerP.  Gallerani M.D.   On: 01/03/2019 13:40   CT IMAGE GUIDED DRAINAGE BY PERCUTANEOUS CATHETER  Result Date: 01/04/2019 CLINICAL DATA:  Postoperative right-sided peritoneal fluid collection/abscess after colectomy for colonic perforation. EXAM: CT GUIDED CATHETER DRAINAGE OF PERITONEAL ABSCESS ANESTHESIA/SEDATION: 1.5 mg IV Versed 125 mcg IV Fentanyl Total Moderate Sedation Time:  21 minutes [name] level of consciousness and physiologic status were continuously monitored during the procedure by Radiology nursing. PROCEDURE: The procedure, risks, benefits, and alternatives were explained to the patient. Questions  regarding the procedure were encouraged and answered. The patient understands and consents to the procedure. A time out was performed prior to initiating the procedure. CT was performed in a supine position through the mid to lower abdomen. The right abdominal wall was prepped with chlorhexidine in a sterile fashion, and a sterile drape was applied covering the operative field. A sterile gown and sterile gloves were used for the procedure. Local anesthesia was provided with 1% Lidocaine. An 18 gauge trocar needle was advanced into a right lateral peritoneal fluid collection. After return of fluid, a guidewire was advanced into the collection. The tract was dilated and a 12 French percutaneous drainage catheter placed. Catheter position was confirmed by CT. A fluid  sample was withdrawn from the catheter and sent for culture analysis. The catheter was attached to a suction bulb. The catheter was secured at the skin with a Prolene retention suture and StatLock device. COMPLICATIONS: None FINDINGS: Large right lateral peritoneal fluid collection again noted. Aspiration yielded thin, serosanguineous fluid. After placement of the drainage catheter, there is abundant return of fluid from the drain. IMPRESSION: CT-guided percutaneous catheter drainage of right lateral peritoneal fluid collection yielding serosanguineous fluid. A sample was sent for culture analysis. A 12 French drain was placed and attached to suction bulb drainage. Electronically Signed   By: Irish Lack M.D.   On: 01/04/2019 15:40    Cardiac Studies   Echocardiogram 07/20/2018 IMPRESSIONS  1. The left ventricle has severely reduced systolic function, with an ejection fraction of 25-30%. The cavity size was moderately dilated. Left ventricular diastolic Doppler parameters are consistent with pseudonormalization. Left ventricular diffuse  hypokinesis. 2. The right ventricle has normal systolic function. The cavity was normal. There is no increase in right ventricular wall thickness. 3. Left atrial size was mildly dilated. 4. No evidence of mitral valve stenosis. Trivial mitral regurgitation. 5. The aortic valve is tricuspid. No stenosis of the aortic valve. 6. The aortic root is normal in size and structure. 7. The inferior vena cava was dilated in size with <50% respiratory variability. No complete TR doppler jet so unable to estimate PA systolic pressure.  Patient Profile     53 y.o. male  with a hx of chronic diastolic HF,h/o ofLv thrombus, nonischemic cardiomyopathy (EF 25-30% 2013, EF 2020 25-30%), ICD placed 08/2018, PSVT (elected for medical therapy over RFA), DVT/PE, Paroxysmal afib on amiodarone, Aflutter ablation 11/2016, lupus, and morbid obesityinitially seen for preop  evaluation in setting of abdominal pain/acute diverticulitis. Followed for hx of CHF.  Assessment & Plan    Acute diverticulitis/Colon Mass/ Sepsis Patient was admitted 12/7 for acute diverticulitis and was being treated medically but did not improve. 12/15 he was taken to the OR found to have perforated cecum and surgery found a mass. In the OR patient became hypotensive and required pressors. Patient was intubated and transferred to ICU for sepsis, place on IV abx. Patient was extubated 12/16. - Path from colon mass shows benign colonic mucosa with mild reactive changes, no active inflammation, no malignancy - Still on TPN - Off pressors - Management per CCM/IM - s/p drainage of large abcess  Chronic systolic HF (LVEF 25-30%) - Followed by Advanced Heart Failure team - At baseline was on Entresto, spironolactone, and Torsemide. All were held during admission - Digoxin was continued. Level yesterday 0.5. - creatinine has been stable - 1.3 today - Fluid intake by TPN  - Weight down 16lbs from yesterday and 4lbs from admit - currently 329lbs -  His weight at last AHF clinic a year ago was 301lbs and with EP last month was 333lbs (felt to be euvolemic at that time) - will start Lasix 40mg  IV BID - follow I&Os and daily weights - restart Entresto when taking PO   Paroxysmal Afib - Had Aflutter ablation 2018 - On amiodarone at home.  - Eliquis for stroke prevention>>was held for surgery and placed on heparin.  - change Heparin back to Eliquis when OK the surgery - Patient has been maintaining NSR  but having a lot of ventricular ectopy - started on Amio yesterday IV as PO Amio was on hold due to NPO - check EKG  Ventricular ectopy - PVCs and short runs of NSVT vs. PAT with aberration - K+ 4.2 and Mag 2.1 - since not taking PO and had been on Amio at home,  Started  IV Amio gtt for suppression of possible atrial tach with aberration vs. VT and change back to home dose of Amio once  taking PO   For questions or updates, please contact [facility] Please consult [email or web address] for contact info under        Signed, Fransico Him, MD  01/05/2019, 7:47 AM

## 2019-01-05 NOTE — Progress Notes (Signed)
[name].  PROGRESS NOTE    Roberto [name]  WUJ:[id number] DOB: 1965/11/25 DOA: 12/19/2018 PCP: Ronnald NianLalonde, [name], MD   Brief Narrative:   Pt. With history of NICM, EF 25-30%, PSVT, PAF, chronic systolic HF with AICD, hx of DVT/PE/LV thrombus , on chronic anticoagulation ( bridged with heparin), was admitted 12/20/2018 for acute diverticulitis. He was initially treated conservatively, but had no real improvement and was taken to the OR 12/28/2018. Pt had Left colonic stricture of unknown etiology, and perforated cecum. Per surgery's note there was a mass in the mid descending colon. The right colon was ischemic and had perforated. There was visualization of leakage of a large amount of stool throughout his entire abdominal cavity. In OR patient became hypotensive, required pressors (Phenylephrine and Levo). Surgery felt the patient was not ready for extubation, and considering co morbidities and possible septic insult asked PCCM to admit to ICU manage care.  [facility] on 01/03/2019 now off pressors.  01/05/19: C/o right elbow pain   Assessment & Plan:   Principal Problem:   Acute diverticulitis Active Problems:   Obesity, morbid (HCC)   Nonischemic cardiomyopathy (HCC)   Paroxysmal SVT (supraventricular tachycardia) (HCC)   Chronic systolic heart failure (HCC)   Chronic pain of right knee   History of DVT (deep vein thrombosis)   PAF (paroxysmal atrial fibrillation) (HCC)   Pre-operative cardiovascular examination   Bowel obstruction (HCC)   Respiratory failure (HCC)  Septic shock secondary to perforated cecum and contamination of abdominal cavity with stool s/p subtotal colectomy and ileostomy left colon mass     - now off pressors     - Wound care and drain management per surgery; pushing diet?; rec 3x day dressing change     - on Zosyn and fluconazole     - afebrile, WBC improving  Peritoneal abscess status post CT-guided drainage by interventional radiologist on  01/04/2019, Dr. Fredia SorrowYamagata.     - Management per interventional radiology  Acute hypoxic respiratory failure possibly secondary to postop atelectasis     - Normal oxygen supplementation at baseline     - on RA  Chronic combined systolic and diastolic CHF     - Last 2D echo LVEF 25 to 30%     - Status post AICD     - follow fluids status, daily wts  Paroxysmal A. fib History of DVT/PE/LV thrombus     - continue digoxin, amiodarone and heparin drip     - Cardiology following.  Morbid obesity     - BMI 54     - Recommend weight loss outpatient once stable  Right elbow pain     - order films  DVT prophylaxis: heparin Code Status: FULL   Disposition Plan: TBD  Consultants:   Cardiology  Surgery  Antimicrobials:  . Zosyn, diflucan   ROS:  Denies CP, N, V, dyspnea. Reports elbow pain, ab pain . Remainder 10-pt ROS is negative for all not previously mentioned.  Subjective: "I've had gout in my leg before."  Objective: Vitals:   01/05/19 0536 01/05/19 0800 01/05/19 0810 01/05/19 1331  BP: 114/62  121/70 (!) 101/50  Pulse: 80   83  Resp:    16  Temp: 99 F (37.2 C) 99.6 F (37.6 C) 99.6 F (37.6 C) 98.5 F (36.9 C)  TempSrc: Oral Oral Oral Oral  SpO2: 97%   97%  Weight: (!) 149.3 kg     Height:  Intake/Output Summary (Last 24 hours) at 01/05/2019 1614 Last data filed at 01/05/2019 1300 Gross per 24 hour  Intake 128 ml  Output 2941 ml  Net -2813 ml   Filed Weights   12/31/18 0500 01/03/19 1610 01/05/19 0536  Weight: (!) 145.9 kg (!) 156.9 kg (!) 149.3 kg    Examination:  General: 53 y.o. male resting in bed in NAD Cardiovascular: RRR, +S1, S2, no m/g/r, equal pulses throughout Respiratory: CTABL, no w/r/r, normal WOB GI: BS+, surgical site noted, good granulation tissue, TTP MSK: BLE edema chronic, No c/c Neuro: A&O x 3, no focal deficits Psyc: Appropriate interaction and affect, calm/cooperative   Data Reviewed: I have personally  reviewed following labs and imaging studies.  CBC: Recent Labs  Lab 01/01/19 0409 01/02/19 0100 01/03/19 0521 01/04/19 0415 01/05/19 0250  WBC 12.9* 15.2* 20.3* 18.9* 17.9*  NEUTROABS  --   --  13.9*  --   --   HGB 9.5* 9.8* 9.5* 9.5* 9.3*  HCT 30.0* 30.8* 30.2* 31.0* 29.6*  MCV 92.6 91.7 93.2 93.7 90.5  PLT 121* 144* 156 201 062   Basic Metabolic Panel: Recent Labs  Lab 12/31/18 0451 01/01/19 0409 01/02/19 0100 01/03/19 0521 01/04/19 0415 01/05/19 0250  NA 144 142 144 143 141 144  K 3.7 3.5 3.7 4.1 4.2 3.9  CL 110 108 112* 113* 109 112*  CO2 27 25 24 22 22 25   GLUCOSE 157* 202* 191* 211* 214* 185*  BUN 11 17 16 17 19  21*  CREATININE 1.25* 1.14 1.20 1.23 1.30* 1.30*  CALCIUM 7.7* 7.6* 7.8* 7.6* 7.7* 7.9*  MG 1.8 1.9 2.0 2.1  --  1.9  PHOS 1.5* 2.5 2.1* 3.2  --  3.7   GFR: Estimated Creatinine Clearance: 92.4 mL/min (A) (by C-G formula based on SCr of 1.3 mg/dL (H)). Liver Function Tests: Recent Labs  Lab 12/30/18 0558 12/31/18 0451 01/03/19 0521 01/04/19 0415 01/05/19 0250  AST 25  --  26 29 29   ALT 18  --  16 17 17   ALKPHOS 27*  --  27* 39 38  BILITOT 1.0  --  0.5 1.0 1.0  PROT 5.2*  --  4.9* 5.3* 5.4*  ALBUMIN 1.9* 1.5* 1.2* 1.2* 1.2*   No results for input(s): LIPASE, AMYLASE in the last 168 hours. No results for input(s): AMMONIA in the last 168 hours. Coagulation Profile: No results for input(s): INR, PROTIME in the last 168 hours. Cardiac Enzymes: No results for input(s): CKTOTAL, CKMB, CKMBINDEX, TROPONINI in the last 168 hours. BNP (last 3 results) No results for input(s): PROBNP in the last 8760 hours. HbA1C: No results for input(s): HGBA1C in the last 72 hours. CBG: Recent Labs  Lab 01/04/19 2152 01/04/19 2349 01/05/19 0542 01/05/19 0755 01/05/19 1120  GLUCAP 201* 184* 178* 199* 226*   Lipid Profile: Recent Labs    01/03/19 0521  TRIG 101   Thyroid Function Tests: No results for input(s): TSH, T4TOTAL, FREET4, T3FREE,  THYROIDAB in the last 72 hours. Anemia Panel: No results for input(s): VITAMINB12, FOLATE, FERRITIN, TIBC, IRON, RETICCTPCT in the last 72 hours. Sepsis Labs: No results for input(s): PROCALCITON, LATICACIDVEN in the last 168 hours.  Recent Results (from the past 240 hour(s))  Surgical pcr screen     Status: None   Collection Time: 12/27/18 11:37 PM   Specimen: Nasal Mucosa; Nasal Swab  Result Value Ref Range Status   MRSA, PCR NEGATIVE NEGATIVE Final   Staphylococcus aureus NEGATIVE NEGATIVE Final  Comment: (NOTE) The Xpert SA Assay (FDA approved for NASAL specimens in patients 1 years of age and older), is one component of a comprehensive surveillance program. It is not intended to diagnose infection nor to guide or monitor treatment. Performed at [facility], [address] [place], [place] [address]   Aerobic/Anaerobic Culture (surgical/deep wound)     Status: None (Preliminary result)   Collection Time: 01/04/19  4:00 PM   Specimen: Abscess  Result Value Ref Range Status   Specimen Description ABSCESS PERITONEAL  Final   Special Requests Normal  Final   Gram Stain   Final    MODERATE WBC PRESENT, PREDOMINANTLY PMN RARE GRAM POSITIVE RODS    Culture   Final    CULTURE REINCUBATED FOR BETTER GROWTH Performed at [facility], [address], [place], [place] [address]    Report Status PENDING  Incomplete      Radiology Studies: CT IMAGE GUIDED DRAINAGE BY PERCUTANEOUS CATHETER  Result Date: 01/04/2019 CLINICAL DATA:  Postoperative right-sided peritoneal fluid collection/abscess after colectomy for colonic perforation. EXAM: CT GUIDED CATHETER DRAINAGE OF PERITONEAL ABSCESS ANESTHESIA/SEDATION: 1.5 mg IV Versed 125 mcg IV Fentanyl Total Moderate Sedation Time:  21 minutes [name] level of consciousness and physiologic status were continuously monitored during the procedure by Radiology nursing. PROCEDURE: The procedure, risks, benefits, and  alternatives were explained to the patient. Questions regarding the procedure were encouraged and answered. The patient understands and consents to the procedure. A time out was performed prior to initiating the procedure. CT was performed in a supine position through the mid to lower abdomen. The right abdominal wall was prepped with chlorhexidine in a sterile fashion, and a sterile drape was applied covering the operative field. A sterile gown and sterile gloves were used for the procedure. Local anesthesia was provided with 1% Lidocaine. An 18 gauge trocar needle was advanced into a right lateral peritoneal fluid collection. After return of fluid, a guidewire was advanced into the collection. The tract was dilated and a 12 French percutaneous drainage catheter placed. Catheter position was confirmed by CT. A fluid sample was withdrawn from the catheter and sent for culture analysis. The catheter was attached to a suction bulb. The catheter was secured at the skin with a Prolene retention suture and StatLock device. COMPLICATIONS: None FINDINGS: Large right lateral peritoneal fluid collection again noted. Aspiration yielded thin, serosanguineous fluid. After placement of the drainage catheter, there is abundant return of fluid from the drain. IMPRESSION: CT-guided percutaneous catheter drainage of right lateral peritoneal fluid collection yielding serosanguineous fluid. A sample was sent for culture analysis. A 12 French drain was placed and attached to suction bulb drainage. Electronically Signed   By: Irish Lack M.D.   On: 01/04/2019 15:40     Scheduled Meds: . acetaminophen  650 mg Oral Q6H  . chlorhexidine  15 mL Mouth Rinse BID  . Chlorhexidine Gluconate Cloth  6 each Topical Daily  . digoxin  0.125 mg Intravenous Daily  . feeding supplement (ENSURE ENLIVE)  237 mL Oral TID BM  . feeding supplement (PRO-STAT SUGAR FREE 64)  30 mL Oral TID BM  . furosemide  40 mg Intravenous BID  . insulin  aspart  0-20 Units Subcutaneous Q4H  . mouth rinse  15 mL Mouth Rinse q12n4p  . methocarbamol  500 mg Oral TID  . pantoprazole (PROTONIX) IV  40 mg Intravenous Q24H  . sodium chloride flush  10-40 mL Intracatheter Q12H  Continuous Infusions: . sodium chloride Stopped (12/20/18 1319)  . sodium chloride    . amiodarone 30 mg/hr (01/05/19 1024)  . fluconazole (DIFLUCAN) IV 400 mg (01/05/19 1417)  . heparin 3,200 Units/hr (01/05/19 1458)  . piperacillin-tazobactam (ZOSYN)  IV 3.375 g (01/05/19 1419)  . TPN ADULT (ION) 80 mL/hr at 01/04/19 1802  . TPN ADULT (ION)       LOS: 16 days    Time spent: 25 minutes spent in the coordination of care today.    Teddy Spike, DO [facility] Pager [phone]  If 7PM-7AM, please contact night-coverage [email or web address] Password [facility] 01/05/2019, 4:14 PM

## 2019-01-05 NOTE — Progress Notes (Signed)
Physical Therapy Treatment Patient Details Name: Roberto [name] MRN: [id number] DOB: 1965/01/24 Today's Date: 01/05/2019    History of Present Illness Pt is a 53 y.o. M with history of NICM, EF 25-30%, PSVT, PAF, chronic systolic HF with AICD, DVT/PE/LV thrombus , on chronic anticoagulation who was admitted 12/20/2018 for acute diverticulitis. He was initially treated conservatively, but had no real improvement and was taken to the OR 12/28/2018.Pt was found to have a perforated cecum with leakage of stool throughout abdominal cavity. 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 He was brought from the OR to NICU intubated and on pressors. Extubated 12/16.    PT Comments    MD without clear answer- states "follow your protocols"; however given time patient has been on heparin feel he should be safe to participate. Able to perform bed mobility with MaxAx1 today, however very limited by pain at EOB. Attempted standing however he was limited by pain- RN aware and medicated during session. He requests PT return tomorrow so he can try again due to high pain levels. He was left in bed positioned to comfort with all needs met, bed alarm active.     Follow Up Recommendations  CIR     Equipment Recommendations  Other (comment)(TBD with progression)    Recommendations for Other Services       Precautions / Restrictions Precautions Precautions: Fall;Other (comment) Precaution Comments: colostomy, JP drains Restrictions Weight Bearing Restrictions: No    Mobility  Bed Mobility Overal bed mobility: Needs Assistance Bed Mobility: Supine to Sit;Sit to Supine   Sidelying to sit: Max assist;HOB elevated   Sit to supine: Max assist;+2 for physical assistance   General bed mobility comments: able to sit at EOB with HOB elevated and MaxA for trunk elevation, did require MaxAx2 for return to bed due to pain  Transfers Overall transfer level: Needs assistance Equipment used: Rolling walker (2  wheeled)             General transfer comment: attempted, patient tried once, unable to clear buttocks due to pain then requested to just try tomorrow as he felt his pain was too high  Ambulation/Gait                 Stairs             Wheelchair Mobility    Modified Rankin (Stroke Patients Only)       Balance Overall balance assessment: Needs assistance   Sitting balance-Leahy Scale: Good     Standing balance support: Bilateral upper extremity supported Standing balance-Leahy Scale: Poor Standing balance comment: bil UE reliance on RW                            Cognition Arousal/Alertness: Awake/alert Behavior During Therapy: WFL for tasks assessed/performed Overall Cognitive Status: Within Functional Limits for tasks assessed                                        Exercises      General Comments        Pertinent Vitals/Pain Pain Assessment: 0-10 Pain Score: 8  Pain Location: R UE Pain Descriptors / Indicators: Aching;Throbbing;Sore Pain Intervention(s): Limited activity within patient's tolerance;Monitored during session;Patient requesting pain meds-RN notified;RN gave pain meds during session    Home Living  Prior Function            PT Goals (current goals can now be found in the care plan section) Acute Rehab PT Goals Patient Stated Goal: to get OOB PT Goal Formulation: With patient Time For Goal Achievement: 01/16/19 Potential to Achieve Goals: Good Progress towards PT goals: Progressing toward goals    Frequency    Min 3X/week      PT Plan Current plan remains appropriate    Co-evaluation              AM-PAC PT "6 Clicks" Mobility   Outcome Measure  Help needed turning from your back to your side while in a flat bed without using bedrails?: A Lot Help needed moving from lying on your back to sitting on the side of a flat bed without using bedrails?: A  Lot Help needed moving to and from a bed to a chair (including a wheelchair)?: Total Help needed standing up from a chair using your arms (e.g., wheelchair or bedside chair)?: Total Help needed to walk in hospital room?: Total Help needed climbing 3-5 steps with a railing? : Total 6 Click Score: 8    End of Session Equipment Utilized During Treatment: Gait belt Activity Tolerance: Patient tolerated treatment well;Patient limited by pain Patient left: in bed;with call bell/phone within reach;with bed alarm set   PT Visit Diagnosis: Other abnormalities of gait and mobility (R26.89);Pain;Muscle weakness (generalized) (M62.81) Pain - Right/Left: Right Pain - part of body: (abdomen)     Time: 1798-1025 PT Time Calculation (min) (ACUTE ONLY): 28 min  Charges:  $Therapeutic Activity: 23-37 mins                     Windell Norfolk, DPT, PN1   Supplemental Physical Therapist [place]    Pager [phone] Acute Rehab Office [phone]

## 2019-01-05 NOTE — Progress Notes (Signed)
[facility] for Heparin  Indication: hx VTE, LV mural thrombus, afib   Allergies  Allergen Reactions  . Shrimp [Shellfish Allergy] Hives and Itching    Patient Measurements: Height: 5\' 7"  (170.2 cm) Weight: (!) 345 lb 14.4 oz (156.9 kg) IBW/kg (Calculated) : 66.1 Heparin Dosing Weight:  101.6 kg  Vital Signs: Temp: 99.5 F (37.5 C) (12/22 2350) Temp Source: Oral (12/22 2350) BP: 101/56 (12/22 2350) Pulse Rate: 85 (12/22 2350)  Labs: Recent Labs    01/03/19 0521 01/04/19 0415 01/04/19 1457 01/05/19 0250  HGB 9.5* 9.5*  --  9.3*  HCT 30.2* 31.0*  --  29.6*  PLT 156 201  --  231  HEPARINUNFRC <0.10* 0.21* <0.10* 0.29*  CREATININE 1.23 1.30*  --   --     Estimated Creatinine Clearance: 95.2 mL/min (A) (by C-G formula based on SCr of 1.3 mg/dL (H)).   Assessment: 53 y.o. male with h/o Afib and LV thrombus, Eliquis on hold, for IV heparin.  Heparin level is just below goal at 0.29 on 2600 units/hr. No bleeding noted, Hgb 9.5 and stable, platelets are normal. Heparin infusing in left wrist, level drawn from PICC so should be accurate.  12/23 AM update: Heparin level just below goal No issues per RN  Goal of Therapy:  Heparin level 0.3-0.7 units/ml Monitor platelets by anticoagulation protocol: Yes   Plan:  Inc heparin to 3200 units/hr Re-check heparin level in 6-8 hours Daily heparin level and CBC Monitor for s/sx of bleeding  Narda Bonds, PharmD, BCPS Clinical Pharmacist Phone: [phone]

## 2019-01-05 NOTE — Progress Notes (Signed)
Inpatient Rehabilitation Admissions Coordinator  I met with patient at bedside to discuss goals an expectations of an inpt rehab admit. Noted pt remains on IV amiodarone. I await medical readiness to begin insurance authorization with [employer] for a possible inpt rehab admit when medically ready. Pt is in agreement. I will follow.  Danne Baxter, RN, MSN Rehab Admissions Coordinator [phone] 01/05/2019 1:14 PM

## 2019-01-05 NOTE — Progress Notes (Signed)
[facility] Progress Note  8 Days Post-Op  Subjective: CC: elbow pain Patient reports he is having a lot of R elbow pain and having a hard time moving it. Reports abdominal pain is stable. Denies nausea and having ileostomy output. Was tolerating CLD prior to procedure yesterday, he did not get started back on liquids after drain was placed.   Objective: Vital signs in last 24 hours: Temp:  [98.9 F (37.2 C)-101.1 F (38.4 C)] 99 F (37.2 C) (12/23 0536) Pulse Rate:  [80-96] 80 (12/23 0536) Resp:  [12-25] 16 (12/22 1715) BP: (81-142)/(47-76) 114/62 (12/23 0536) SpO2:  [93 %-97 %] 97 % (12/23 0536) Weight:  [149.3 kg] 149.3 kg (12/23 0536) Last BM Date: 01/03/19  Intake/Output from previous day: 12/22 0701 - 12/23 0700 In: 10 [I.V.:10] Out: 3966 [Urine:2900; Drains:415; Stool:651] Intake/Output this shift: No intake/output data recorded.  PE: Gen:  Alert, NAD, pleasant Card:  Regular rate and rhythm Pulm:  normal effort Abd: Soft, appropriately ttp, stoma pink and viable, drain in LLQ with SS fluid, drain on R side with SS and some purulent material, midline wound as below with inferior tract that is ~6 cm deep and tissue bridge superiorly, fascia appears necrotic superiorly with wound dehiscence but no evisceration  Ext: R elbow is warm and erythematous, no fluctuance  Lab Results:  Recent Labs    01/04/19 0415 01/05/19 0250  WBC 18.9* 17.9*  HGB 9.5* 9.3*  HCT 31.0* 29.6*  PLT 201 231   BMET Recent Labs    01/04/19 0415 01/05/19 0250  NA 141 144  K 4.2 3.9  CL 109 112*  CO2 22 25  GLUCOSE 214* 185*  BUN 19 21*  CREATININE 1.30* 1.30*  CALCIUM 7.7* 7.9*   PT/INR No results for input(s): LABPROT, INR in the last 72 hours. CMP     Component Value Date/Time   NA 144 01/05/2019 0250   NA 140 08/26/2018 1355   K 3.9 01/05/2019 0250   CL 112 (H) 01/05/2019 0250   CO2 25 01/05/2019 0250   GLUCOSE 185 (H) 01/05/2019 0250   BUN 21 (H)  01/05/2019 0250   BUN 14 08/26/2018 1355   CREATININE 1.30 (H) 01/05/2019 0250   CREATININE 1.20 10/29/2016 1236   CALCIUM 7.9 (L) 01/05/2019 0250   PROT 5.4 (L) 01/05/2019 0250   PROT 7.4 12/03/2018 1459   ALBUMIN 1.2 (L) 01/05/2019 0250   ALBUMIN 3.6 (L) 12/03/2018 1459   AST 29 01/05/2019 0250   ALT 17 01/05/2019 0250   ALKPHOS 38 01/05/2019 0250   BILITOT 1.0 01/05/2019 0250   BILITOT 0.3 12/03/2018 1459   GFRNONAA >60 01/05/2019 0250   GFRAA >60 01/05/2019 0250   Lipase     Component Value Date/Time   LIPASE 27 12/19/2018 2038       Studies/Results: CT ABDOMEN PELVIS W CONTRAST  Result Date: 01/03/2019 CLINICAL DATA:  Six days status post subtotal colectomy and ileostomy. Rising white blood cell count. EXAM: CT ABDOMEN AND PELVIS WITH CONTRAST TECHNIQUE: Multidetector CT imaging of the abdomen and pelvis was performed using the standard protocol following bolus administration of intravenous contrast. CONTRAST:  OMNIPAQUE IOHEXOL 300 MG/ML  SOLN COMPARISON:  CT scan 12/27/2018 FINDINGS: Lower chest: Small bilateral pleural effusions with overlying atelectasis. The heart is mildly enlarged but stable. Stable pacer wires. Hepatobiliary: Stable liver lesions. No new or worrisome lesions. There is mass effect on the right lateral margin of the liver inferiorly likely due to adjacent abscess.  Layering gallstones are noted the gallbladder. No common bile duct dilatation. Pancreas: No mass, inflammation or ductal dilatation. Spleen: Normal size.  No focal lesions. Adrenals/Urinary Tract: The adrenal glands and kidneys are unremarkable. Stable scarring changes involving the right renal cortex posteriorly. No hydronephrosis. The bladder is moderately distended Stomach/Bowel: The stomach, duodenum and small bowel are grossly normal. The mid distal small bowel loops are slightly dilated and there are scattered air-fluid levels but contrast is getting through the small bowel and this has  more the appearance of an ileus. 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 No complicating features are identified. Subtotal colectomy. The rectum and part of the sigmoid colon are still present with a Hartmann's pouch. No complicating features. Vascular/Lymphatic: The aorta and branch vessels are patent. No atherosclerotic calcifications. Scattered mesenteric and retroperitoneal lymph nodes but no mass or overt adenopathy. Reproductive: The prostate gland and seminal vesicles are unremarkable. Other: There is a large right-sided intra-abdominal abscess measuring 20 x 17 x 7.5 cm. This is along the lower lateral margin of the liver and extends down in right paracolic gutter to just above the iliac crest. It contains gas. Diffuse mesenteric edema but no focal mesenteric fluid collections to suggest other abscesses. JP drainage catheter just above the bladder but no surrounding fluid collections. Musculoskeletal: No significant bony findings. IMPRESSION: 1. Large (20 x 17 x 7.5 cm) intra-abdominal abscess on the right side. 2. Mesenteric edema and a few mesenteric fluid collections but no discrete mesenteric abscess. 3. Right lower quadrant ileostomy and probable mild small-bowel ileus. 4. Small bilateral pleural effusions and bibasilar atelectasis. 5. Moderate distention of the bladder. 6. Probable cholelithiasis. These results will be called to the ordering clinician or representative by the Radiologist Assistant, and communication documented in the PACS or zVision Dashboard. Electronically Signed   By: Rudie Meyer M.D.   On: 01/03/2019 13:40   CT IMAGE GUIDED DRAINAGE BY PERCUTANEOUS CATHETER  Result Date: 01/04/2019 CLINICAL DATA:  Postoperative right-sided peritoneal fluid collection/abscess after colectomy for colonic perforation. EXAM: CT GUIDED CATHETER DRAINAGE OF PERITONEAL ABSCESS ANESTHESIA/SEDATION: 1.5 mg IV Versed 125 mcg IV Fentanyl Total Moderate Sedation Time:  21 minutes [name]  level of consciousness and physiologic status were continuously monitored during the procedure by Radiology nursing. PROCEDURE: The procedure, risks, benefits, and alternatives were explained to the patient. Questions regarding the procedure were encouraged and answered. The patient understands and consents to the procedure. A time out was performed prior to initiating the procedure. CT was performed in a supine position through the mid to lower abdomen. The right abdominal wall was prepped with chlorhexidine in a sterile fashion, and a sterile drape was applied covering the operative field. A sterile gown and sterile gloves were used for the procedure. Local anesthesia was provided with 1% Lidocaine. An 18 gauge trocar needle was advanced into a right lateral peritoneal fluid collection. After return of fluid, a guidewire was advanced into the collection. The tract was dilated and a 12 French percutaneous drainage catheter placed. Catheter position was confirmed by CT. A fluid sample was withdrawn from the catheter and sent for culture analysis. The catheter was attached to a suction bulb. The catheter was secured at the skin with a Prolene retention suture and StatLock device. COMPLICATIONS: None FINDINGS: Large right lateral peritoneal fluid collection again noted. Aspiration yielded thin, serosanguineous fluid. After placement of the drainage catheter, there is abundant return of fluid from the drain. IMPRESSION: CT-guided percutaneous catheter drainage of right lateral  peritoneal fluid collection yielding serosanguineous fluid. A sample was sent for culture analysis. A 12 French drain was placed and attached to suction bulb drainage. Electronically Signed   By: Aletta Edouard M.D.   On: 01/04/2019 15:40    Anti-infectives: Anti-infectives (From admission, onward)   Start     Dose/Rate Route Frequency Ordered Stop   12/28/18 1400  fluconazole (DIFLUCAN) IVPB 400 mg     400 mg 100 mL/hr over 120 Minutes  Intravenous Every 24 hours 12/28/18 1318     12/20/18 2200  piperacillin-tazobactam (ZOSYN) IVPB 3.375 g     3.375 g 12.5 mL/hr over 240 Minutes Intravenous Every 8 hours 12/20/18 1834     12/20/18 0645  piperacillin-tazobactam (ZOSYN) IVPB 3.375 g  Status:  Discontinued     3.375 g 100 mL/hr over 30 Minutes Intravenous Every 6 hours 12/20/18 0630 12/20/18 1833   12/19/18 2315  piperacillin-tazobactam (ZOSYN) IVPB 3.375 g     3.375 g 100 mL/hr over 30 Minutes Intravenous  Once 12/19/18 2302 12/20/18 0003       Assessment/Plan CHF EF 25-30% Atrial fibrillation Hx DVT/PE LV mural thrombus Gross anticoagulant Morbid obesityBMI 50.63 CKDcreatinine 1.81>>1.44>>1.29>>1.43>>1.54 Dehydration R elbow pain - primary team ordering films  POD#8STC/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

## 2019-01-05 NOTE — Progress Notes (Addendum)
PHARMACY - TOTAL PARENTERAL NUTRITION CONSULT NOTE  Indication: massive bowel resection  Patient Measurements: Height: 5\' 7"  (170.2 cm) Weight: (!) 329 lb 2.4 oz (149.3 kg) IBW/kg (Calculated) : 66.1 TPN AdjBW (KG): 86.1 Body mass index is 51.55 kg/m.  Assessment:  68 yom presented to the hospital with abdominal pain found to be acute diverticulitis. He was treated conservatively but without improvement. He was taken to the OR 12/15 and found to have a perforated cecum with leakage of stool throughout the abdominal cavity. He remains on the ventilator with vasopressors. Patient has been NPO or clear liquids since admission so he may be at risk for refeeding syndrome.   Glucose / Insulin: A1c 6.3% - CBGs improving.  Required 26 units SSI last 24 hrs. Electrolytes: CL slightly elevated (fluctuating), others WNL Renal: SCr 1.3, BUN 21 LFTs / TGs: LFTs / tbili / TG WNL Prealbumin / albumin: prealbumin 6.1, albumin 1.2 Intake / Output; MIVF: drain O/P 45mL, ileostomy O/P 670ml, UOP 0.8 ml/kg/hr, net +2.5L (down) - Lasix 40mg  IV BID GI Imaging: 12/14 Abd xray: Persistent bowel dilatation. No free air demonstrable 12/14 CT abd: Worsening distal large-bowel obstruction at the level of the descending colon correlating with an approximately 5 cm in length segment of irregular annular wall thickening with underlying colonic diverticulosis and mild pericolonic fat stranding 12/9 Abd xray: Worsening gaseous distension of the proximal colon and now involving much of the small bowel Surgeries / Procedures:  12/22 IR drainage of right peritoneal abscess 12/15 Exlap, subtotal colectomy and end ileostomy 12/10 Flexible sigmoidoscopy  Central access: PICC 12/28/18 TPN start date: 12/29/18  Nutritional Goals (per RD rec on 12/17): KCal: 2200-2400, Protein: 130-150, Fluid: >/=1.8 L/day  Current Nutrition:  TPN  12/19 CLD started (tolerated per surgery note)) >> NPO for procedure 12/22  Plan:   Continue concentrated TPN at goal rate of 80 ml/hr, providing 148g AA, 300g CHO and 77g ILE for a total of 2378 kCal, meeting 100% of patient needs Electrolytes in TPN: reduce Na slightly, keep Cl:Ac 1:2 today Add standard MVI MWF and trace elements daily to TPN Continue resistant SSI Q4H + increase regular insulin in TPN to 25 units F/U AM labs, CBGs  Roberto [name] D. Mina Marble, PharmD, BCPS, [place] 01/05/2019, 8:36 AM  ============================  Addendum: Started on HH/CMD and Pharmacy asked to 1/2 TPN today and potentially stopping tomorrow 12/24 if patient tolerates diet  Reduce TPN to 40 ml/hr, which will provide 74g AA, 150g CHO and 38g ILE for a total of 1188 kCal, meeting ~50% of patient needs Try to maintain electrolytes with reduced TPN rate, max acetate Reduce insulin in TPN to 10 units F/U AM labs, PO intake to stop TPN  Roberto [name] D. Mina Marble, PharmD, BCPS, [place] 01/05/2019, 10:20 AM

## 2019-01-06 DIAGNOSIS — I5023 Acute on chronic systolic (congestive) heart failure: Secondary | ICD-10-CM

## 2019-01-06 LAB — CBC
HCT: 27.8 % — ABNORMAL LOW (ref 39.0–52.0)
Hemoglobin: 9.1 g/dL — ABNORMAL LOW (ref 13.0–17.0)
MCH: 29.1 pg (ref 26.0–34.0)
MCHC: 32.7 g/dL (ref 30.0–36.0)
MCV: 88.8 fL (ref 80.0–100.0)
Platelets: 227 10*3/uL (ref 150–400)
RBC: 3.13 MIL/uL — ABNORMAL LOW (ref 4.22–5.81)
RDW: 15.6 % — ABNORMAL HIGH (ref 11.5–15.5)
WBC: 21.6 10*3/uL — ABNORMAL HIGH (ref 4.0–10.5)
nRBC: 0.2 % (ref 0.0–0.2)

## 2019-01-06 LAB — COMPREHENSIVE METABOLIC PANEL
ALT: 20 U/L (ref 0–44)
AST: 31 U/L (ref 15–41)
Albumin: 1.2 g/dL — ABNORMAL LOW (ref 3.5–5.0)
Alkaline Phosphatase: 49 U/L (ref 38–126)
Anion gap: 11 (ref 5–15)
BUN: 22 mg/dL — ABNORMAL HIGH (ref 6–20)
CO2: 28 mmol/L (ref 22–32)
Calcium: 7.9 mg/dL — ABNORMAL LOW (ref 8.9–10.3)
Chloride: 102 mmol/L (ref 98–111)
Creatinine, Ser: 1.35 mg/dL — ABNORMAL HIGH (ref 0.61–1.24)
GFR calc Af Amer: >60 mL/min (ref >60)
GFR calc non Af Amer: 60 mL/min — ABNORMAL LOW (ref >60)
Glucose, Bld: 147 mg/dL — ABNORMAL HIGH (ref 70–99)
Potassium: 3.9 mmol/L (ref 3.5–5.1)
Sodium: 141 mmol/L (ref 135–145)
Total Bilirubin: 0.6 mg/dL (ref 0.3–1.2)
Total Protein: 5.6 g/dL — ABNORMAL LOW (ref 6.5–8.1)

## 2019-01-06 LAB — MAGNESIUM: Magnesium: 2 mg/dL (ref 1.7–2.4)

## 2019-01-06 LAB — GLUCOSE, CAPILLARY
Glucose-Capillary: 149 mg/dL — ABNORMAL HIGH (ref 70–99)
Glucose-Capillary: 137 mg/dL — ABNORMAL HIGH (ref 70–99)
Glucose-Capillary: 129 mg/dL — ABNORMAL HIGH (ref 70–99)
Glucose-Capillary: 144 mg/dL — ABNORMAL HIGH (ref 70–99)
Glucose-Capillary: 171 mg/dL — ABNORMAL HIGH (ref 70–99)

## 2019-01-06 LAB — PHOSPHORUS: Phosphorus: 4.4 mg/dL (ref 2.5–4.6)

## 2019-01-06 LAB — HEPARIN LEVEL (UNFRACTIONATED): Heparin Unfractionated: 0.54 IU/mL (ref 0.30–0.70)

## 2019-01-06 MED ORDER — HYDROMORPHONE HCL 1 MG/ML IJ SOLN: 1.0000 mg | Freq: Once | INTRAMUSCULAR | Status: DC

## 2019-01-06 MED ORDER — MORPHINE SULFATE (PF) 2 MG/ML IV SOLN
2.0000 mg | INTRAVENOUS | Status: DC | PRN
  Administered 2019-01-06: 2 mg via INTRAVENOUS
  Administered 2019-01-06 (×4): 4 mg via INTRAVENOUS
  Administered 2019-01-07: 2 mg via INTRAVENOUS
  Administered 2019-01-08: 4 mg via INTRAVENOUS
  Administered 2019-01-08: 2 mg via INTRAVENOUS
  Administered 2019-01-08: 4 mg via INTRAVENOUS
  Administered 2019-01-09: 2 mg via INTRAVENOUS
  Administered 2019-01-10: 4 mg via INTRAVENOUS
  Administered 2019-01-10: 2 mg via INTRAVENOUS
  Administered 2019-01-10 (×2): 4 mg via INTRAVENOUS
  Administered 2019-01-11 – 2019-01-12 (×6): 2 mg via INTRAVENOUS
  Administered 2019-01-13: 4 mg via INTRAVENOUS
  Administered 2019-01-13: 2 mg via INTRAVENOUS
  Administered 2019-01-13: 4 mg via INTRAVENOUS
  Administered 2019-01-13 – 2019-01-14 (×4): 2 mg via INTRAVENOUS
  Administered 2019-01-15 (×2): 4 mg via INTRAVENOUS
  Administered 2019-01-15 – 2019-01-17 (×6): 2 mg via INTRAVENOUS
  Filled 2019-01-06 (×2): qty 1
  Filled 2019-01-06 (×3): qty 2
  Filled 2019-01-06 (×3): qty 1
  Filled 2019-01-06: qty 2
  Filled 2019-01-06 (×2): qty 1
  Filled 2019-01-06: qty 2
  Filled 2019-01-06: qty 1
  Filled 2019-01-06: qty 2
  Filled 2019-01-06 (×5): qty 1
  Filled 2019-01-06: qty 2
  Filled 2019-01-06: qty 1
  Filled 2019-01-06: qty 2
  Filled 2019-01-06 (×4): qty 1
  Filled 2019-01-06: qty 2
  Filled 2019-01-06: qty 1
  Filled 2019-01-06: qty 2
  Filled 2019-01-06: qty 1
  Filled 2019-01-06: qty 2
  Filled 2019-01-06 (×2): qty 1
  Filled 2019-01-06 (×2): qty 2
  Filled 2019-01-06: qty 1
  Filled 2019-01-06: qty 2

## 2019-01-06 MED ORDER — SODIUM CHLORIDE 0.9% FLUSH
5.0000 mL | Freq: Three times a day (TID) | INTRAVENOUS | Status: DC
  Administered 2019-01-06 – 2019-01-24 (×29): 5 mL

## 2019-01-06 MED ORDER — TRACE MINERALS CU-MN-SE-ZN 300-55-60-3000 MCG/ML IV SOLN
INTRAVENOUS | Status: AC
  Filled 2019-01-06: qty 492.8

## 2019-01-06 NOTE — Progress Notes (Signed)
PT Cancellation Note  Patient Details Name: Roberto [name] MRN: [id number] DOB: [birth year]   Cancelled Treatment:    Reason Eval/Treat Not Completed: Other (comment) per chart review patient's abdominal wound had popped open overnight. Nursing notes state that MD has recommended NPO and bedrest. Holding PT for now, will continue to follow acutely.    Windell Norfolk, DPT, PN1   Supplemental Physical Therapist [facility]    Pager [phone] Acute Rehab Office [phone]

## 2019-01-06 NOTE — Progress Notes (Signed)
Pt.called & complained that when he coughed he felt like  something pop out from his abdominal  Wound..& c/o also pain on the site.Dressing removed & noted the wound more opened as compared to the picture on the chart.Dr.Tsuei was notified & ordered to just redressed the wound & to keep pt.on bedrest & NPO.Will continue to monitor pt.

## 2019-01-06 NOTE — Progress Notes (Signed)
Referring Physician(s): Tsuei,M  Supervising Physician: Oley Balm  Patient Status:  [facility] - In-pt  Chief Complaint: Abdominal pain/fluid collection   Subjective: Pt still having mid abd discomfort; midline open wound covered with wet to dry dressing; BP soft; denies N/V   Allergies: Shrimp [shellfish allergy]  Medications: Prior to Admission medications   Medication Sig Start Date End Date Taking? Authorizing Provider  acetaminophen (TYLENOL) 650 MG CR tablet Take 1,300 mg by mouth every 8 (eight) hours as needed for pain.   Yes [provider]  amiodarone (PACERONE) 200 MG tablet Take 1 tablet (200 mg total) by mouth daily. 10/18/18  Yes Bensimhon, Bevelyn Buckles, MD  coconut oil OIL Apply 1 application topically as needed (dry skin).    Yes [provider]  digoxin (LANOXIN) 0.125 MG tablet TAKE 1 TABLET(0.125 MG) BY MOUTH DAILY Patient taking differently: Take 0.125 mg by mouth daily.  11/01/18  Yes Bensimhon, Bevelyn Buckles, MD  ELIQUIS 5 MG TABS tablet TAKE 1 TABLET(5 MG) BY MOUTH TWICE DAILY Patient taking differently: Take 5 mg by mouth 2 (two) times daily.  03/22/18  Yes Bensimhon, Bevelyn Buckles, MD  ENTRESTO 49-51 MG TAKE 1 TABLET BY MOUTH TWICE DAILY Patient taking differently: Take 1 tablet by mouth 2 (two) times daily.  09/15/18  Yes Bensimhon, Bevelyn Buckles, MD  potassium chloride SA (KLOR-CON) 20 MEQ tablet Take 2 tablets (40 mEq total) by mouth 2 (two) times daily. 10/18/18  Yes Bensimhon, Bevelyn Buckles, MD  spironolactone (ALDACTONE) 25 MG tablet TAKE 1 TABLET(25 MG) BY MOUTH DAILY Patient taking differently: Take 25 mg by mouth daily.  12/03/18  Yes [name], MD  torsemide (DEMADEX) 20 MG tablet TAKE 2 TABLETS BY MOUTH TWICE DAILY. STOP LASIX Patient taking differently: Take 40 mg by mouth 2 (two) times daily.  06/29/18  Yes Bensimhon, Bevelyn Buckles, MD     Vital Signs: BP (!) 101/56   Pulse 85   Temp 98.8 F (37.1 C) (Oral)   Resp 16   Ht 5\' 7"  (1.702 m)    Wt (!) 329 lb 2.4 oz (149.3 kg)   SpO2 97%   BMI 51.55 kg/m   Physical Exam awake/alert; RLQ drain intact, output 70 cc blood- tinged /amber fluid; drain flushed without difficulty   Imaging: DG Elbow 2 Views Right  Result Date: 01/05/2019 CLINICAL DATA:  53 year old male with right elbow pain and swelling. No known injury. EXAM: RIGHT ELBOW - 2 VIEW COMPARISON:  None. FINDINGS: No obvious acute fracture. There is no dislocation. There is however joint effusion with elevation of the anterior and posterior fat pads. An occult fracture involving the radial head is not entirely excluded. Clinical correlation recommended. The bones are well mineralized. No significant arthritic changes. The soft tissues are grossly unremarkable. IMPRESSION: 1. No acute fracture or dislocation. 2. Joint effusion. An occult fracture is not entirely excluded. Clinical correlation recommended. Electronically Signed   By: 40 M.D.   On: 01/05/2019 21:41   CT ABDOMEN PELVIS W CONTRAST  Result Date: 01/03/2019 CLINICAL DATA:  Six days status post subtotal colectomy and ileostomy. Rising white blood cell count. EXAM: CT ABDOMEN AND PELVIS WITH CONTRAST TECHNIQUE: Multidetector CT imaging of the abdomen and pelvis was performed using the standard protocol following bolus administration of intravenous contrast. CONTRAST:  01/05/2019 OMNIPAQUE IOHEXOL 300 MG/ML  SOLN COMPARISON:  CT scan 12/27/2018 FINDINGS: Lower chest: Small bilateral pleural effusions with overlying atelectasis. The heart is mildly enlarged but stable.  Stable pacer wires. Hepatobiliary: Stable liver lesions. No new or worrisome lesions. There is mass effect on the right lateral margin of the liver inferiorly likely due to adjacent abscess. Layering gallstones are noted the gallbladder. No common bile duct dilatation. Pancreas: No mass, inflammation or ductal dilatation. Spleen: Normal size.  No focal lesions. Adrenals/Urinary Tract: The adrenal glands  and kidneys are unremarkable. Stable scarring changes involving the right renal cortex posteriorly. No hydronephrosis. The bladder is moderately distended Stomach/Bowel: The stomach, duodenum and small bowel are grossly normal. The mid distal small bowel loops are slightly dilated and there are scattered air-fluid levels but contrast is getting through the small bowel and this has more the appearance of an ileus. 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 No complicating features are identified. Subtotal colectomy. The rectum and part of the sigmoid colon are still present with a Hartmann's pouch. No complicating features. Vascular/Lymphatic: The aorta and branch vessels are patent. No atherosclerotic calcifications. Scattered mesenteric and retroperitoneal lymph nodes but no mass or overt adenopathy. Reproductive: The prostate gland and seminal vesicles are unremarkable. Other: There is a large right-sided intra-abdominal abscess measuring 20 x 17 x 7.5 cm. This is along the lower lateral margin of the liver and extends down in right paracolic gutter to just above the iliac crest. It contains gas. Diffuse mesenteric edema but no focal mesenteric fluid collections to suggest other abscesses. JP drainage catheter just above the bladder but no surrounding fluid collections. Musculoskeletal: No significant bony findings. IMPRESSION: 1. Large (20 x 17 x 7.5 cm) intra-abdominal abscess on the right side. 2. Mesenteric edema and a few mesenteric fluid collections but no discrete mesenteric abscess. 3. Right lower quadrant ileostomy and probable mild small-bowel ileus. 4. Small bilateral pleural effusions and bibasilar atelectasis. 5. Moderate distention of the bladder. 6. Probable cholelithiasis. These results will be called to the ordering clinician or representative by the Radiologist Assistant, and communication documented in the PACS or zVision Dashboard. Electronically Signed   By: Rudie Meyer M.D.   On:  01/03/2019 13:40   CT IMAGE GUIDED DRAINAGE BY PERCUTANEOUS CATHETER  Result Date: 01/04/2019 CLINICAL DATA:  Postoperative right-sided peritoneal fluid collection/abscess after colectomy for colonic perforation. EXAM: CT GUIDED CATHETER DRAINAGE OF PERITONEAL ABSCESS ANESTHESIA/SEDATION: 1.5 mg IV Versed 125 mcg IV Fentanyl Total Moderate Sedation Time:  21 minutes [name] level of consciousness and physiologic status were continuously monitored during the procedure by Radiology nursing. PROCEDURE: The procedure, risks, benefits, and alternatives were explained to the patient. Questions regarding the procedure were encouraged and answered. The patient understands and consents to the procedure. A time out was performed prior to initiating the procedure. CT was performed in a supine position through the mid to lower abdomen. The right abdominal wall was prepped with chlorhexidine in a sterile fashion, and a sterile drape was applied covering the operative field. A sterile gown and sterile gloves were used for the procedure. Local anesthesia was provided with 1% Lidocaine. An 18 gauge trocar needle was advanced into a right lateral peritoneal fluid collection. After return of fluid, a guidewire was advanced into the collection. The tract was dilated and a 12 French percutaneous drainage catheter placed. Catheter position was confirmed by CT. A fluid sample was withdrawn from the catheter and sent for culture analysis. The catheter was attached to a suction bulb. The catheter was secured at the skin with a Prolene retention suture and StatLock device. COMPLICATIONS: None FINDINGS: Large right lateral peritoneal fluid collection  again noted. Aspiration yielded thin, serosanguineous fluid. After placement of the drainage catheter, there is abundant return of fluid from the drain. IMPRESSION: CT-guided percutaneous catheter drainage of right lateral peritoneal fluid collection yielding serosanguineous fluid. A  sample was sent for culture analysis. A 12 French drain was placed and attached to suction bulb drainage. Electronically Signed   By: Aletta Edouard M.D.   On: 01/04/2019 15:40    Labs:  CBC: Recent Labs    01/03/19 0521 01/04/19 0415 01/05/19 0250 01/06/19 0401  WBC 20.3* 18.9* 17.9* 21.6*  HGB 9.5* 9.5* 9.3* 9.1*  HCT 30.2* 31.0* 29.6* 27.8*  PLT 156 201 231 227    COAGS: Recent Labs    12/28/18 0447 12/28/18 1410 12/29/18 0500 12/30/18 0447 12/30/18 1639  INR  --  1.5*  --   --   --   APTT 106*  --  29 32 78*    BMP: Recent Labs    01/03/19 0521 01/04/19 0415 01/05/19 0250 01/06/19 0401  NA 143 141 144 141  K 4.1 4.2 3.9 3.9  CL 113* 109 112* 102  CO2 22 22 25 28   GLUCOSE 211* 214* 185* 147*  BUN 17 19 21* 22*  CALCIUM 7.6* 7.7* 7.9* 7.9*  CREATININE 1.23 1.30* 1.30* 1.35*  GFRNONAA >60 >60 >60 60*  GFRAA >60 >60 >60 >60    LIVER FUNCTION TESTS: Recent Labs    01/03/19 0521 01/04/19 0415 01/05/19 0250 01/06/19 0401  BILITOT 0.5 1.0 1.0 0.6  AST 26 29 29 31   ALT 16 17 17 20   ALKPHOS 27* 39 38 49  PROT 4.9* 5.3* 5.4* 5.6*  ALBUMIN 1.2* 1.2* 1.2* 1.2*    Assessment and Plan: Pt s/p colectomy for colonic perforation and subsequent development of rt perit fluid collection, s/p drain placement 12/22; afebrile; WBC 21.6(17.9), hgb 9.1(9.3), creat 1.35, drain fluid cx pend; cont drain irrigation, output monitoring; once OP minimal or if clinical status worsens obtain f/u CT    Electronically Signed: D. Rowe Robert, PA-C 01/06/2019, 2:35 PM   I spent a total of 15 minutes at the the patient's bedside AND on the patient's hospital floor or unit, greater than 50% of which was counseling/coordinating care for right peritoneal fluid collection drain    Patient ID: Roberto [name], male   DOB: [birth year], 53 y.o.   MRN: [id number]

## 2019-01-06 NOTE — Progress Notes (Addendum)
[name]  PROGRESS NOTE    Roberto [name]  EQA:[id number] DOB: 1965/10/22 DOA: 12/19/2018 PCP: [name], MD   Brief Narrative:   Pt. With history of NICM, EF 25-30%, PSVT, PAF, chronic systolic HF with AICD, hx of DVT/PE/LV thrombus , on chronic anticoagulation ( bridged with heparin), was admitted 12/20/2018 for acute diverticulitis. He was initially treated conservatively, but had no real improvement and was taken to the OR 12/28/2018. Pt had Left colonic stricture of unknown etiology, and perforated cecum. Per surgery's note there was a mass in the mid descending colon. The right colon was ischemic and had perforated. There was visualization of leakage of a large amount of stool throughout his entire abdominal cavity. In OR patient became hypotensive, required pressors (Phenylephrine and Levo). Surgery felt the patient was not ready for extubation, and considering co morbidities and possible septic insult asked PCCM to admit to ICU manage care. Transferred to [facility] service on 01/03/2019 now off pressors.  01/06/19: Rt elbow film w/ effusion, will speak with ortho. Possible return to OR with surgery per nursing.    Assessment & Plan:   Principal Problem:   Acute diverticulitis Active Problems:   Obesity, morbid (HCC)   Nonischemic cardiomyopathy (HCC)   Paroxysmal SVT (supraventricular tachycardia) (HCC)   Chronic systolic heart failure (HCC)   Chronic pain of right knee   History of DVT (deep vein thrombosis)   PAF (paroxysmal atrial fibrillation) (HCC)   Pre-operative cardiovascular examination   Bowel obstruction (HCC)   Respiratory failure (HCC)   Acute on chronic systolic heart failure (HCC)  Septic shock secondary to perforated cecum and contamination of abdominal cavity with stool s/p subtotal colectomy and ileostomy left colon mass     - now off pressors     - Wound care and drain management per surgery; pushing diet?; rec 3x day dressing change     - on Zosyn and  fluconazole     - afebrile, WBC up today; draining abscess today?  Peritoneal abscess status post CT-guided drainage by interventional radiologist on 01/04/2019, Dr. Fredia Sorrow.     - Management per interventional radiology; drain today?  Acute hypoxic respiratory failure possibly secondary to postop atelectasis     - Normal oxygen supplementation at baseline     - on RA  Chronic combined systolic and diastolic CHF     - Last 2D echo LVEF 25 to 30%     - Status post AICD     - follow fluids status, daily wts  Paroxysmal A. fib History of DVT/PE/LV thrombus     - continue digoxin, amiodarone and heparin drip     - Cardiology following; appreciate assistance  Morbid obesity     - BMI 54     - Recommend weight loss outpatient once stable  Right elbow pain     - films noted; will speak with ortho  DVT prophylaxis: heparin Code Status: FULL   Disposition Plan: TBD  Consultants:   Cardiology  General Surgery  IR  Antimicrobials:  . Zosyn, diflucan   Subjective: "They told me I might go back to the OR."  Objective: Vitals:   01/06/19 0800 01/06/19 1000 01/06/19 1300 01/06/19 1430  BP: (!) 105/58 (!) 104/58 (!) 110/52 (!) 101/56  Pulse: 78 79 85 85  Resp:      Temp:      TempSrc:      SpO2: 91% 92% 94% 97%  Weight:      Height:  Intake/Output Summary (Last 24 hours) at 01/06/2019 1609 Last data filed at 01/06/2019 1023 Gross per 24 hour  Intake 1027 ml  Output 3912 ml  Net -2885 ml   Filed Weights   12/31/18 0500 01/03/19 1610 01/05/19 0536  Weight: (!) 145.9 kg (!) 156.9 kg (!) 149.3 kg    Examination:  General: 53 y.o. male resting in bed in NAD Cardiovascular: RRR, +S1, S2, no m/g/r Respiratory: CTABL, no w/r/r GI: BS+, obese, surgical site noted, TTP MSK: No e/c/c Neuro: A&O x 3, no focal deficits Psyc: Appropriate interaction and affect, calm/cooperative   Data Reviewed: I have personally reviewed following labs and imaging  studies.  CBC: Recent Labs  Lab 01/02/19 0100 01/03/19 0521 01/04/19 0415 01/05/19 0250 01/06/19 0401  WBC 15.2* 20.3* 18.9* 17.9* 21.6*  NEUTROABS  --  13.9*  --   --   --   HGB 9.8* 9.5* 9.5* 9.3* 9.1*  HCT 30.8* 30.2* 31.0* 29.6* 27.8*  MCV 91.7 93.2 93.7 90.5 88.8  PLT 144* 156 201 231 227   Basic Metabolic Panel: Recent Labs  Lab 01/01/19 0409 01/02/19 0100 01/03/19 0521 01/04/19 0415 01/05/19 0250 01/06/19 0401  NA 142 144 143 141 144 141  K 3.5 3.7 4.1 4.2 3.9 3.9  CL 108 112* 113* 109 112* 102  CO2 25 24 22 22 25 28   GLUCOSE 202* 191* 211* 214* 185* 147*  BUN 17 16 17 19  21* 22*  CREATININE 1.14 1.20 1.23 1.30* 1.30* 1.35*  CALCIUM 7.6* 7.8* 7.6* 7.7* 7.9* 7.9*  MG 1.9 2.0 2.1  --  1.9 2.0  PHOS 2.5 2.1* 3.2  --  3.7 4.4   GFR: Estimated Creatinine Clearance: 89 mL/min (A) (by C-G formula based on SCr of 1.35 mg/dL (H)). Liver Function Tests: Recent Labs  Lab 12/31/18 0451 01/03/19 0521 01/04/19 0415 01/05/19 0250 01/06/19 0401  AST  --  26 29 29 31   ALT  --  16 17 17 20   ALKPHOS  --  27* 39 38 49  BILITOT  --  0.5 1.0 1.0 0.6  PROT  --  4.9* 5.3* 5.4* 5.6*  ALBUMIN 1.5* 1.2* 1.2* 1.2* 1.2*   No results for input(s): LIPASE, AMYLASE in the last 168 hours. No results for input(s): AMMONIA in the last 168 hours. Coagulation Profile: No results for input(s): INR, PROTIME in the last 168 hours. Cardiac Enzymes: No results for input(s): CKTOTAL, CKMB, CKMBINDEX, TROPONINI in the last 168 hours. BNP (last 3 results) No results for input(s): PROBNP in the last 8760 hours. HbA1C: No results for input(s): HGBA1C in the last 72 hours. CBG: Recent Labs  Lab 01/05/19 2003 01/05/19 2334 01/06/19 0347 01/06/19 0738 01/06/19 1152  GLUCAP 274* 170* 149* 137* 129*   Lipid Profile: No results for input(s): CHOL, HDL, LDLCALC, TRIG, CHOLHDL, LDLDIRECT in the last 72 hours. Thyroid Function Tests: No results for input(s): TSH, T4TOTAL, FREET4, T3FREE,  THYROIDAB in the last 72 hours. Anemia Panel: No results for input(s): VITAMINB12, FOLATE, FERRITIN, TIBC, IRON, RETICCTPCT in the last 72 hours. Sepsis Labs: No results for input(s): PROCALCITON, LATICACIDVEN in the last 168 hours.  Recent Results (from the past 240 hour(s))  Surgical pcr screen     Status: None   Collection Time: 12/27/18 11:37 PM   Specimen: Nasal Mucosa; Nasal Swab  Result Value Ref Range Status   MRSA, PCR NEGATIVE NEGATIVE Final   Staphylococcus aureus NEGATIVE NEGATIVE Final    Comment: (NOTE) The Xpert SA Assay (FDA  approved for NASAL specimens in patients 3 years of age and older), is one component of a comprehensive surveillance program. It is not intended to diagnose infection nor to guide or monitor treatment. Performed at [facility], [place] [address], [place], [place] [address]   Aerobic/Anaerobic Culture (surgical/deep wound)     Status: None (Preliminary result)   Collection Time: 01/04/19  4:00 PM   Specimen: Abscess  Result Value Ref Range Status   Specimen Description ABSCESS PERITONEAL  Final   Special Requests Normal  Final   Gram Stain   Final    MODERATE WBC PRESENT, PREDOMINANTLY PMN RARE GRAM POSITIVE RODS    Culture   Final    CULTURE REINCUBATED FOR BETTER GROWTH HOLDING FOR POSSIBLE ANAEROBE Performed at [facility], [address] [place], [place] [address]    Report Status PENDING  Incomplete      Radiology Studies: DG Elbow 2 Views Right  Result Date: 01/05/2019 CLINICAL DATA:  53 year old male with right elbow pain and swelling. No known injury. EXAM: RIGHT ELBOW - 2 VIEW COMPARISON:  None. FINDINGS: No obvious acute fracture. There is no dislocation. There is however joint effusion with elevation of the anterior and posterior fat pads. An occult fracture involving the radial head is not entirely excluded. Clinical correlation recommended. The bones are well mineralized. No significant arthritic changes. The  soft tissues are grossly unremarkable. IMPRESSION: 1. No acute fracture or dislocation. 2. Joint effusion. An occult fracture is not entirely excluded. Clinical correlation recommended. Electronically Signed   By: Anner Crete M.D.   On: 01/05/2019 21:41     Scheduled Meds: . acetaminophen  650 mg Oral Q6H  . chlorhexidine  15 mL Mouth Rinse BID  . Chlorhexidine Gluconate Cloth  6 each Topical Daily  . digoxin  0.125 mg Intravenous Daily  . feeding supplement (ENSURE ENLIVE)  237 mL Oral TID BM  . feeding supplement (PRO-STAT SUGAR FREE 64)  30 mL Oral TID BM  . insulin aspart  0-20 Units Subcutaneous Q4H  . mouth rinse  15 mL Mouth Rinse q12n4p  . methocarbamol  500 mg Oral TID  . pantoprazole (PROTONIX) IV  40 mg Intravenous Q24H  . sodium chloride flush  10-40 mL Intracatheter Q12H  . sodium chloride flush  5 mL Intracatheter Q8H   Continuous Infusions: . sodium chloride Stopped (12/20/18 1319)  . sodium chloride    . amiodarone 30 mg/hr (01/06/19 1151)  . fluconazole (DIFLUCAN) IV 400 mg (01/06/19 1430)  . heparin 3,200 Units/hr (01/06/19 1150)  . piperacillin-tazobactam (ZOSYN)  IV 3.375 g (01/06/19 1423)  . TPN ADULT (ION) 40 mL/hr at 01/05/19 1738  . TPN ADULT (ION)       LOS: 17 days    Time spent: 25 minutes spent in the coordination of care today.    Jonnie Finner, DO [facility]  If 7PM-7AM, please contact night-coverage [email or web address] 01/06/2019, 4:09 PM

## 2019-01-06 NOTE — Progress Notes (Signed)
Progress Note  Patient Name: Roberto [name] Date of Encounter: 01/06/2019  Primary Cardiologist: Arvilla Meres, MD   Subjective   He put out 3.8 L urine yesterday for a net -1.2L. He denies chest pain. Abdomen is still sore.  Inpatient Medications    Scheduled Meds:  acetaminophen  650 mg Oral Q6H   chlorhexidine  15 mL Mouth Rinse BID   Chlorhexidine Gluconate Cloth  6 each Topical Daily   digoxin  0.125 mg Intravenous Daily   feeding supplement (ENSURE ENLIVE)  237 mL Oral TID BM   feeding supplement (PRO-STAT SUGAR FREE 64)  30 mL Oral TID BM   furosemide  40 mg Intravenous BID   insulin aspart  0-20 Units Subcutaneous Q4H   mouth rinse  15 mL Mouth Rinse q12n4p   methocarbamol  500 mg Oral TID   pantoprazole (PROTONIX) IV  40 mg Intravenous Q24H   sodium chloride flush  10-40 mL Intracatheter Q12H   Continuous Infusions:  sodium chloride Stopped (12/20/18 1319)   sodium chloride     amiodarone 30 mg/hr (01/05/19 2343)   fluconazole (DIFLUCAN) IV 400 mg (01/05/19 1417)   heparin 3,200 Units/hr (01/05/19 2344)   piperacillin-tazobactam (ZOSYN)  IV 3.375 g (01/06/19 0550)   TPN ADULT (ION) 40 mL/hr at 01/05/19 1738   PRN Meds: sodium chloride, alum & mag hydroxide-simeth, morphine injection, ondansetron (ZOFRAN) IV, oxyCODONE, sodium chloride flush   Vital Signs    Vitals:   01/06/19 0413 01/06/19 0500 01/06/19 0600 01/06/19 0625  BP:  (!) 105/52 (!) 105/58   Pulse: 76 70 79 79  Resp:      Temp:      TempSrc:      SpO2: 95% 93% 98% 94%  Weight:      Height:        Intake/Output Summary (Last 24 hours) at 01/06/2019 0745 Last data filed at 01/06/2019 0550 Gross per 24 hour  Intake 4277.02 ml  Output 5537 ml  Net -1259.98 ml   Last 3 Weights 01/05/2019 01/03/2019 12/31/2018  Weight (lbs) 329 lb 2.4 oz 345 lb 14.4 oz 321 lb 10.4 oz  Weight (kg) 149.3 kg 156.9 kg 145.9 kg      Telemetry    NSR, HR 70s, PACs and PVCs -  Personally Reviewed  ECG    No new - Personally Reviewed  Physical Exam   GEN: No acute distress.   Neck: No JVD Cardiac: RRR, no murmurs, rubs, or gallops.  Respiratory: Clear to auscultation bilaterally. GI: Soft, nontender, non-distended  MS: 1+ edema; No deformity. Neuro:  Nonfocal  Psych: Normal affect   Labs    High Sensitivity Troponin:  No results for input(s): TROPONINIHS in the last 720 hours.    Chemistry Recent Labs  Lab 01/04/19 0415 01/05/19 0250 01/06/19 0401  NA 141 144 141  K 4.2 3.9 3.9  CL 109 112* 102  CO2 22 25 28   GLUCOSE 214* 185* 147*  BUN 19 21* 22*  CREATININE 1.30* 1.30* 1.35*  CALCIUM 7.7* 7.9* 7.9*  PROT 5.3* 5.4* 5.6*  ALBUMIN 1.2* 1.2* 1.2*  AST 29 29 31   ALT 17 17 20   ALKPHOS 39 38 49  BILITOT 1.0 1.0 0.6  GFRNONAA >60 >60 60*  GFRAA >60 >60 >60  ANIONGAP 10 7 11      Hematology Recent Labs  Lab 01/04/19 0415 01/05/19 0250 01/06/19 0401  WBC 18.9* 17.9* 21.6*  RBC 3.31* 3.27* 3.13*  HGB 9.5* 9.3* 9.1*  HCT 31.0* 29.6* 27.8*  MCV 93.7 90.5 88.8  MCH 28.7 28.4 29.1  MCHC 30.6 31.4 32.7  RDW 15.8* 15.5 15.6*  PLT 201 231 227    BNPNo results for input(s): BNP, PROBNP in the last 168 hours.   DDimer No results for input(s): DDIMER in the last 168 hours.   Radiology    DG Elbow 2 Views Right  Result Date: 01/05/2019 CLINICAL DATA:  53 year old male with right elbow pain and swelling. No known injury. EXAM: RIGHT ELBOW - 2 VIEW COMPARISON:  None. FINDINGS: No obvious acute fracture. There is no dislocation. There is however joint effusion with elevation of the anterior and posterior fat pads. An occult fracture involving the radial head is not entirely excluded. Clinical correlation recommended. The bones are well mineralized. No significant arthritic changes. The soft tissues are grossly unremarkable. IMPRESSION: 1. No acute fracture or dislocation. 2. Joint effusion. An occult fracture is not entirely excluded.  Clinical correlation recommended. Electronically Signed   By: Anner Crete M.D.   On: 01/05/2019 21:41   CT IMAGE GUIDED DRAINAGE BY PERCUTANEOUS CATHETER  Result Date: 01/04/2019 CLINICAL DATA:  Postoperative right-sided peritoneal fluid collection/abscess after colectomy for colonic perforation. EXAM: CT GUIDED CATHETER DRAINAGE OF PERITONEAL ABSCESS ANESTHESIA/SEDATION: 1.5 mg IV Versed 125 mcg IV Fentanyl Total Moderate Sedation Time:  21 minutes [name] level of consciousness and physiologic status were continuously monitored during the procedure by Radiology nursing. PROCEDURE: The procedure, risks, benefits, and alternatives were explained to the patient. Questions regarding the procedure were encouraged and answered. The patient understands and consents to the procedure. A time out was performed prior to initiating the procedure. CT was performed in a supine position through the mid to lower abdomen. The right abdominal wall was prepped with chlorhexidine in a sterile fashion, and a sterile drape was applied covering the operative field. A sterile gown and sterile gloves were used for the procedure. Local anesthesia was provided with 1% Lidocaine. An 18 gauge trocar needle was advanced into a right lateral peritoneal fluid collection. After return of fluid, a guidewire was advanced into the collection. The tract was dilated and a 12 French percutaneous drainage catheter placed. Catheter position was confirmed by CT. A fluid sample was withdrawn from the catheter and sent for culture analysis. The catheter was attached to a suction bulb. The catheter was secured at the skin with a Prolene retention suture and StatLock device. COMPLICATIONS: None FINDINGS: Large right lateral peritoneal fluid collection again noted. Aspiration yielded thin, serosanguineous fluid. After placement of the drainage catheter, there is abundant return of fluid from the drain. IMPRESSION: CT-guided percutaneous  catheter drainage of right lateral peritoneal fluid collection yielding serosanguineous fluid. A sample was sent for culture analysis. A 12 French drain was placed and attached to suction bulb drainage. Electronically Signed   By: Aletta Edouard M.D.   On: 01/04/2019 15:40    Cardiac Studies   Echocardiogram 07/20/2018 IMPRESSIONS  1. The left ventricle has severely reduced systolic function, with an ejection fraction of 25-30%. The cavity size was moderately dilated. Left ventricular diastolic Doppler parameters are consistent with pseudonormalization. Left ventricular diffuse  hypokinesis. 2. The right ventricle has normal systolic function. The cavity was normal. There is no increase in right ventricular wall thickness. 3. Left atrial size was mildly dilated. 4. No evidence of mitral valve stenosis. Trivial mitral regurgitation. 5. The aortic valve is tricuspid. No stenosis of the aortic valve. 6. The aortic root is normal in  size and structure. 7. The inferior vena cava was dilated in size with <50% respiratory variability. No complete TR doppler jet so unable to estimate PA systolic pressure.  Patient Profile     53 y.o. male 53 y.o. male with a hx of chronic diastolic HF,h/o ofLv thrombus, nonischemic cardiomyopathy (EF 25-30% 2013, EF 2020 25-30%), ICD placed 08/2018, PSVT (elected for medical therapy over RFA), DVT/PE, Paroxysmal afib on amiodarone, Aflutter ablation 11/2016, lupus, and morbid obesityinitially seen for preop evaluation in setting of abdominal pain/acute diverticulitis. Followed for hx of CHF.  Assessment & Plan    Acute diverticulitis/Colon Mass/ Sepsis Patient was admitted 12/7 for acute diverticulitis and was being treated medically but did not improve. 12/15 he was taken to the OR found to have perforated cecum and surgery found a mass. In the OR patient became hypotensive and required pressors. Patient was intubated and transferred to ICU for sepsis,  place on IV abx. Patient was extubated 12/16. - Path from colon mass shows benign colonic mucosa with mild reactive changes, no active inflammation, no malignancy - Still on TPN - Off pressors - Management per CCM/IM - s/p drainage of large abcess  Chronic systolic HF (LVEF 25-30%) - Followed by Advanced Heart Failure team - At baseline was on Entresto, spironolactone, and Torsemide. All were held during admission - Digoxin was continued. Level  0.5 3 days ago. Will re-check - Patient was started on IV Lasix 40 mg BID - creatinine 1.30 > 1.35 today - He put out 3.8 L urine in the last 24 hours, down -1.2 L - Weight down 16lbs from yesterday and 4lbs from admit - currently 329lbs - His weight at last AHF clinic a year ago was 301lbs and with EP last month was 333lbs (felt to be euvolemic at that time) - Patient has 1+ edema on exam. continue with diuresis - follow I&Os and daily weights - restart Entresto when taking PO   Paroxysmal Afib - Had Aflutter ablation 2018 - On amiodarone at home.  - Eliquis for stroke prevention>>was held for surgery and placed on heparin.  - change Heparin back to Eliquis when OK the surgery -Patient has been maintaining NSR but having a lot of ventricular ectopy - started on Amio yesterday IV as PO Amio was on hold due to NPO - check EKG  Ventricular ectopy - PVCs and short runs of NSVT vs. PAT with aberration - K+ 4.2 and Mag 2.1 - since not taking PO and had been on Amio at home,  Started  IV Amio gtt for suppression of possible atrial tach with aberration vs. VT and change back to home dose of Amio once taking PO    For questions or updates, please contact [facility] Please consult [email or web address] for contact info under        Signed, Kalel Harty David Stall, PA-C  01/06/2019, 7:45 AM

## 2019-01-06 NOTE — Plan of Care (Signed)
  Problem: Coping: Goal: Level of anxiety will decrease Outcome: Progressing   Problem: Pain Managment: Goal: General experience of comfort will improve Outcome: Progressing   

## 2019-01-06 NOTE — Plan of Care (Signed)
  Problem: Clinical Measurements: Goal: Respiratory complications will improve Outcome: Progressing   Problem: Nutrition: Goal: Adequate nutrition will be maintained Outcome: Progressing   Problem: Coping: Goal: Level of anxiety will decrease 01/06/2019 1144 by Lovie Chol, RN Outcome: Progressing 01/06/2019 1140 by Lovie Chol, RN Outcome: Progressing   Problem: Pain Managment: Goal: General experience of comfort will improve Outcome: Progressing   Problem: Safety: Goal: Ability to remain free from injury will improve Outcome: Progressing

## 2019-01-06 NOTE — Progress Notes (Signed)
PHARMACY - TOTAL PARENTERAL NUTRITION CONSULT NOTE  Indication: massive bowel resection  Patient Measurements: Height: 5\' 7"  (170.2 cm) Weight: (!) 329 lb 2.4 oz (149.3 kg) IBW/kg (Calculated) : 66.1 TPN AdjBW (KG): 86.1 Body mass index is 51.55 kg/m.  Assessment:  64 yom presented to the hospital with abdominal pain found to be acute diverticulitis. He was treated conservatively but without improvement. He was taken to the OR 12/15 and found to have a perforated cecum with leakage of stool throughout the abdominal cavity. He remains on the ventilator with vasopressors. Patient has been NPO or clear liquids since admission so he may be at risk for refeeding syndrome.   Glucose / Insulin: A1c 6.3% - CBGs remains elevated requiring 42 units SSI last 24 hrs but trending down with reduced rate Electrolytes: WNL Renal: SCr 1.35, BUN 22 LFTs / TGs: LFTs / tbili / TG WNL Prealbumin / albumin: prealbumin 6.1, albumin 1.2 Intake / Output; MIVF: drain O/P 230mL, ileostomy O/P 1461ml, UOP 1.1 ml/kg/hr, net -2.8L (down)  GI Imaging: 12/14 Abd xray: Persistent bowel dilatation. No free air demonstrable 12/14 CT abd: Worsening distal large-bowel obstruction at the level of the descending colon correlating with an approximately 5 cm in length segment of irregular annular wall thickening with underlying colonic diverticulosis and mild pericolonic fat stranding 12/9 Abd xray: Worsening gaseous distension of the proximal colon and now involving much of the small bowel Surgeries / Procedures:  12/22 IR drainage of right peritoneal abscess 12/15 Exlap, subtotal colectomy and end ileostomy 12/10 Flexible sigmoidoscopy  Central access: PICC 12/28/18 TPN start date: 12/29/18  Nutritional Goals (per RD rec on 12/17): KCal: 2200-2400, Protein: 130-150, Fluid: >/=1.8 L/day  Current Nutrition:  TPN  Heart healthy/carb modified diet, NPO overnight  Plan:  Continue reduced TPN (per discussion with  surgery) at 40 ml/hr, which will provide 74g AA, 150g CHO and 38g ILE for a total of 1188 kCal, meeting ~50% of patient needs Electrolytes in TPN: no change Add standard MVI MWF and trace elements daily to TPN Continue resistant SSI Q4H + maintain 10 units insulin in TPN since CBGs are trending down this morning F/U PO intake and possible DC TPN tomorrow  Salome Arnt, PharmD, BCPS Clinical Pharmacist Please see AMION for all pharmacy numbers 01/06/2019 10:55 AM

## 2019-01-06 NOTE — Progress Notes (Addendum)
Hand Surgery Progress Note  9 Days Post-Op  Subjective: CC-  Patient states that around midnight he coughed and felt a pop in his abdomen. Complaining of increased pain at his abdominal incision. Denies n/v. Ileostomy functioning.  Objective: Vital signs in last 24 hours: Temp:  [98.5 F (36.9 C)-99.4 F (37.4 C)] 98.8 F (37.1 C) (12/24 0350) Pulse Rate:  [70-83] 78 (12/24 0800) Resp:  [16] 16 (12/23 1331) BP: (92-108)/(50-80) 105/58 (12/24 0800) SpO2:  [91 %-98 %] 91 % (12/24 0800) Last BM Date: 01/05/19  Intake/Output from previous day: 12/23 0701 - 12/24 0700 In: 4277 [P.O.:792; I.V.:3061.5; IV Piggyback:423.5] Out: 9562 [Urine:3880; Drains:227; ZHYQM:5784] Intake/Output this shift: No intake/output data recorded.  PE: Gen: Alert, NAD, pleasant Card: Regular rate and rhythm Pulm:rate and effort normal Abd: obese, soft,ostomy with liquid stool in pouch, drain in LLQ with SS fluid, drain on R side with some purulent material, midline wound as below/ dehisced with some possible visible bowel below necrotic tissue/ no firm end point around distal and 9 o'clock position edges/ faint stool smell when dressing removed     Lab Results:  Recent Labs    01/05/19 0250 01/06/19 0401  WBC 17.9* 21.6*  HGB 9.3* 9.1*  HCT 29.6* 27.8*  PLT 231 227   BMET Recent Labs    01/05/19 0250 01/06/19 0401  NA 144 141  K 3.9 3.9  CL 112* 102  CO2 25 28  GLUCOSE 185* 147*  BUN 21* 22*  CREATININE 1.30* 1.35*  CALCIUM 7.9* 7.9*   PT/INR No results for input(s): LABPROT, INR in the last 72 hours. CMP     Component Value Date/Time   NA 141 01/06/2019 0401   NA 140 08/26/2018 1355   K 3.9 01/06/2019 0401   CL 102 01/06/2019 0401   CO2 28 01/06/2019 0401   GLUCOSE 147 (H) 01/06/2019 0401   BUN 22 (H) 01/06/2019 0401   BUN 14 08/26/2018 1355   CREATININE 1.35 (H) 01/06/2019 0401   CREATININE 1.20 10/29/2016 1236   CALCIUM 7.9 (L) 01/06/2019 0401   PROT  5.6 (L) 01/06/2019 0401   PROT 7.4 12/03/2018 1459   ALBUMIN 1.2 (L) 01/06/2019 0401   ALBUMIN 3.6 (L) 12/03/2018 1459   AST 31 01/06/2019 0401   ALT 20 01/06/2019 0401   ALKPHOS 49 01/06/2019 0401   BILITOT 0.6 01/06/2019 0401   BILITOT 0.3 12/03/2018 1459   GFRNONAA 60 (L) 01/06/2019 0401   GFRAA >60 01/06/2019 0401   Lipase     Component Value Date/Time   LIPASE 27 12/19/2018 2038       Studies/Results: DG Elbow 2 Views Right  Result Date: 01/05/2019 CLINICAL DATA:  53 year old male with right elbow pain and swelling. No known injury. EXAM: RIGHT ELBOW - 2 VIEW COMPARISON:  None. FINDINGS: No obvious acute fracture. There is no dislocation. There is however joint effusion with elevation of the anterior and posterior fat pads. An occult fracture involving the radial head is not entirely excluded. Clinical correlation recommended. The bones are well mineralized. No significant arthritic changes. The soft tissues are grossly unremarkable. IMPRESSION: 1. No acute fracture or dislocation. 2. Joint effusion. An occult fracture is not entirely excluded. Clinical correlation recommended. Electronically Signed   By: Anner Crete M.D.   On: 01/05/2019 21:41   CT IMAGE GUIDED DRAINAGE BY PERCUTANEOUS CATHETER  Result Date: 01/04/2019 CLINICAL DATA:  Postoperative right-sided peritoneal fluid collection/abscess after colectomy for colonic perforation. EXAM: CT GUIDED CATHETER DRAINAGE OF  PERITONEAL ABSCESS ANESTHESIA/SEDATION: 1.5 mg IV Versed 125 mcg IV Fentanyl Total Moderate Sedation Time:  21 minutes [name] level of consciousness and physiologic status were continuously monitored during the procedure by Radiology nursing. PROCEDURE: The procedure, risks, benefits, and alternatives were explained to the patient. Questions regarding the procedure were encouraged and answered. The patient understands and consents to the procedure. A time out was performed prior to initiating the  procedure. CT was performed in a supine position through the mid to lower abdomen. The right abdominal wall was prepped with chlorhexidine in a sterile fashion, and a sterile drape was applied covering the operative field. A sterile gown and sterile gloves were used for the procedure. Local anesthesia was provided with 1% Lidocaine. An 18 gauge trocar needle was advanced into a right lateral peritoneal fluid collection. After return of fluid, a guidewire was advanced into the collection. The tract was dilated and a 12 French percutaneous drainage catheter placed. Catheter position was confirmed by CT. A fluid sample was withdrawn from the catheter and sent for culture analysis. The catheter was attached to a suction bulb. The catheter was secured at the skin with a Prolene retention suture and StatLock device. COMPLICATIONS: None FINDINGS: Large right lateral peritoneal fluid collection again noted. Aspiration yielded thin, serosanguineous fluid. After placement of the drainage catheter, there is abundant return of fluid from the drain. IMPRESSION: CT-guided percutaneous catheter drainage of right lateral peritoneal fluid collection yielding serosanguineous fluid. A sample was sent for culture analysis. A 12 French drain was placed and attached to suction bulb drainage. Electronically Signed   By: Irish Lack M.D.   On: 01/04/2019 15:40    Anti-infectives: Anti-infectives (From admission, onward)   Start     Dose/Rate Route Frequency Ordered Stop   12/28/18 1400  fluconazole (DIFLUCAN) IVPB 400 mg     400 mg 100 mL/hr over 120 Minutes Intravenous Every 24 hours 12/28/18 1318     12/20/18 2200  piperacillin-tazobactam (ZOSYN) IVPB 3.375 g     3.375 g 12.5 mL/hr over 240 Minutes Intravenous Every 8 hours 12/20/18 1834     12/20/18 0645  piperacillin-tazobactam (ZOSYN) IVPB 3.375 g  Status:  Discontinued     3.375 g 100 mL/hr over 30 Minutes Intravenous Every 6 hours 12/20/18 0630 12/20/18 1833    12/19/18 2315  piperacillin-tazobactam (ZOSYN) IVPB 3.375 g     3.375 g 100 mL/hr over 30 Minutes Intravenous  Once 12/19/18 2302 12/20/18 0003       Assessment/Plan CHF EF 25-30% Atrial fibrillation Hx DVT/PE LV mural thrombus Gross anticoagulant Morbid obesityBMI 50.63 AKI on CKD Dehydration R elbow pain - primary team ordering films  POD#9STC/ileostomy fordiverticular stricture of left colon and perforated R colon - s/p drain placement 12/22 - cxs pending, continue IV abx - pathwithout evidence of malignancy - ileostomy functioning  FEN:1/2 TPN, NPO VTE: hold heparin gtt ID: IV Zosyn 12/6>>, IV diflucan 12/15>>  Plan: Wound further dehisced. Will discuss with MD, likely will need to return to the OR. Keep NPO. Hold heparin (starting 0945).  Addendum: Spoke with Dr. Cliffton Asters. No mushrooming of bowel through wound, will monitor for now. Continue BID wet to dry dressing changes. Add abdominal binder. Ok to restart diet (HH/CM) and IV heparin.    LOS: 17 days    Franne Forts, [facility] Surgery 01/06/2019, 9:35 AM Please see Amion for pager number during day hours 7:00am-4:30pm

## 2019-01-06 NOTE — Progress Notes (Signed)
[place] for Heparin  Indication: hx VTE, LV mural thrombus, afib   Allergies  Allergen Reactions  . Shrimp [Shellfish Allergy] Hives and Itching    Patient Measurements: Height: 5\' 7"  (170.2 cm) Weight: (!) 329 lb 2.4 oz (149.3 kg) IBW/kg (Calculated) : 66.1 Heparin Dosing Weight:  101.6 kg  Vital Signs: Temp: 98.8 F (37.1 C) (12/24 0350) Temp Source: Oral (12/24 0350) BP: 105/58 (12/24 0600) Pulse Rate: 79 (12/24 0625)  Labs: Recent Labs    01/04/19 0415 01/05/19 0250 01/05/19 1432 01/06/19 0401  HGB 9.5* 9.3*  --  9.1*  HCT 31.0* 29.6*  --  27.8*  PLT 201 231  --  227  HEPARINUNFRC 0.21* 0.29* 0.58 0.54  CREATININE 1.30* 1.30*  --  1.35*    Estimated Creatinine Clearance: 89 mL/min (A) (by C-G formula based on SCr of 1.35 mg/dL (H)).   Assessment: 53 y.o. male with h/o Afib and LV thrombus, Eliquis on hold, for IV heparin.  Heparin level is at goal on 3200 units/hr. No bleeding noted, Hgb 9.1 and fairly stable, platelets are normal. Heparin infusing in left wrist, level drawn from PICC so should be accurate.  Goal of Therapy:  Heparin level 0.3-0.7 units/ml Monitor platelets by anticoagulation protocol: Yes   Plan:  Continue heparin at 3200 units/hr Daily heparin level and CBC Monitor for s/sx of bleeding F/u plans to resume oral anticoagulation as able.  Marguerite Olea, [facility] Clinical Pharmacist Phone [phone]  01/06/2019 7:24 AM

## 2019-01-07 DIAGNOSIS — I493 Ventricular premature depolarization: Secondary | ICD-10-CM

## 2019-01-07 LAB — GLUCOSE, CAPILLARY
Glucose-Capillary: 151 mg/dL — ABNORMAL HIGH (ref 70–99)
Glucose-Capillary: 162 mg/dL — ABNORMAL HIGH (ref 70–99)
Glucose-Capillary: 149 mg/dL — ABNORMAL HIGH (ref 70–99)
Glucose-Capillary: 214 mg/dL — ABNORMAL HIGH (ref 70–99)
Glucose-Capillary: 191 mg/dL — ABNORMAL HIGH (ref 70–99)
Glucose-Capillary: 175 mg/dL — ABNORMAL HIGH (ref 70–99)

## 2019-01-07 LAB — CBC WITH DIFFERENTIAL/PLATELET
Abs Immature Granulocytes: 1.14 10*3/uL — ABNORMAL HIGH (ref 0.00–0.07)
Basophils Absolute: 0.1 10*3/uL (ref 0.0–0.1)
Basophils Relative: 1 %
Eosinophils Absolute: 0.2 10*3/uL (ref 0.0–0.5)
Eosinophils Relative: 1 %
HCT: 29.2 % — ABNORMAL LOW (ref 39.0–52.0)
Hemoglobin: 9.3 g/dL — ABNORMAL LOW (ref 13.0–17.0)
Immature Granulocytes: 5 %
Lymphocytes Relative: 8 %
Lymphs Abs: 1.8 10*3/uL (ref 0.7–4.0)
MCH: 28.5 pg (ref 26.0–34.0)
MCHC: 31.8 g/dL (ref 30.0–36.0)
MCV: 89.6 fL (ref 80.0–100.0)
Monocytes Absolute: 1.2 10*3/uL — ABNORMAL HIGH (ref 0.1–1.0)
Monocytes Relative: 5 %
Neutro Abs: 18.9 10*3/uL — ABNORMAL HIGH (ref 1.7–7.7)
Neutrophils Relative %: 80 %
Platelets: 301 10*3/uL (ref 150–400)
RBC: 3.26 MIL/uL — ABNORMAL LOW (ref 4.22–5.81)
RDW: 15.5 % (ref 11.5–15.5)
WBC: 23.4 10*3/uL — ABNORMAL HIGH (ref 4.0–10.5)
nRBC: 0.1 % (ref 0.0–0.2)

## 2019-01-07 LAB — DIGOXIN LEVEL: Digoxin Level: 0.6 ng/mL — ABNORMAL LOW (ref 0.8–2.0)

## 2019-01-07 LAB — MAGNESIUM: Magnesium: 2.2 mg/dL (ref 1.7–2.4)

## 2019-01-07 LAB — HEPARIN LEVEL (UNFRACTIONATED)
Heparin Unfractionated: 0.26 IU/mL — ABNORMAL LOW (ref 0.30–0.70)
Heparin Unfractionated: 0.4 IU/mL (ref 0.30–0.70)

## 2019-01-07 MED ORDER — TRACE MINERALS CU-MN-SE-ZN 300-55-60-3000 MCG/ML IV SOLN
INTRAVENOUS | Status: AC
  Filled 2019-01-07: qty 492.8

## 2019-01-07 NOTE — Progress Notes (Signed)
[name]  PROGRESS NOTE    Roberto [name]  CZY:[id number] DOB: May 23, 1965 DOA: 12/19/2018 PCP: Roberto Lung, MD   Brief Narrative:   Pt. With history of NICM, EF 25-30%, PSVT, PAF, chronic systolic HF with AICD, hx of DVT/PE/LV thrombus , on chronic anticoagulation ( bridged with heparin), was admitted 12/20/2018 for acute diverticulitis. He was initially treated conservatively, but had no real improvement and was taken to the OR 12/28/2018. Pt had Left colonic stricture of unknown etiology, and perforated cecum. Per surgery's note there was a mass in the mid descending colon. The right colon was ischemic and had perforated. There was visualization of leakage of a large amount of stool throughout his entire abdominal cavity. In OR patient became hypotensive, required pressors (Phenylephrine and Levo). Surgery felt the patient was not ready for extubation, and considering co morbidities and possible septic insult asked PCCM to admit to ICU manage care. Transferred to [facility] service on 01/03/2019 now off pressors.  01/07/19: Denies complaints. Says he is feeling better.    Assessment & Plan:   Principal Problem:   Acute diverticulitis Active Problems:   Obesity, morbid (HCC)   Nonischemic cardiomyopathy (HCC)   Paroxysmal SVT (supraventricular tachycardia) (HCC)   Chronic systolic heart failure (HCC)   Chronic pain of right knee   History of DVT (deep vein thrombosis)   PAF (paroxysmal atrial fibrillation) (HCC)   Pre-operative cardiovascular examination   Bowel obstruction (HCC)   Respiratory failure (HCC)   Acute on chronic systolic heart failure (HCC)  Septic shock secondary to perforated cecum and contamination of abdominal cavity with stool s/p subtotal colectomy and ileostomy left colon mass - now off pressors - Wound care and drain management per surgery; pushing diet?; rec 3x day dressing change - on Zosyn and fluconazole - afebrile, s/p abscess drain, needs ab  binder  Peritoneal abscess status post CT-guided drainage by interventional radiologist on 01/04/2019, Roberto [name]. - Management per interventional radiology; appreciate assistance  Acute hypoxic respiratory failure possibly secondary to postop atelectasis - Normal oxygen supplementation at baseline - on RA  Chronic combined systolic and diastolic CHF - Last 2D echo LVEF 25 to 30% - Status post AICD - watch daily wts; essentially fluid neutral for stay  Paroxysmal A. fib History of DVT/PE/LV thrombus - continue digoxin, amiodarone and heparin drip - Cardiology following; appreciate assistance  Morbid obesity - BMI 54 - Recommend weight loss outpatient once stable  Right elbow pain - films noted; will speak with ortho  DVT prophylaxis: heparin Code Status: FULL   Disposition Plan: TBD  Consultants:   Cardiology  General Surgery  IR  Antimicrobials:  . Zosyn, diflucan   Subjective: "Yeah, they told me that they are gonna watch."  Objective: Vitals:   01/06/19 2044 01/07/19 0100 01/07/19 0109 01/07/19 0427  BP: (!) 97/57 96/60  107/60  Pulse: 85   81  Resp: (!) 31 (!) 24  (!) 23  Temp: (!) 100.7 F (38.2 C)  98.7 F (37.1 C) 98.6 F (37 C)  TempSrc: Oral  Oral Oral  SpO2: 90% 92%  91%  Weight:    (!) 145.4 kg  Height:        Intake/Output Summary (Last 24 hours) at 01/07/2019 1519 Last data filed at 01/07/2019 0700 Gross per 24 hour  Intake 1458.91 ml  Output 700 ml  Net 758.91 ml   Filed Weights   01/03/19 1610 01/05/19 0536 01/07/19 0427  Weight: (!) 156.9 kg (!) 149.3 kg [name])  145.4 kg    Examination:  General: 53 y.o. male resting in bed in NAD Cardiovascular: RRR, +S1, S2, no m/g/r, equal pulses throughout Respiratory: CTABL, no w/r/r, normal WOB GI: BS+, obese, surgical site noted, TTP, ostomy MSK: No e/c/c Neuro: A&O x 3, no focal deficits Psyc: Appropriate interaction and affect,  calm/cooperative   Data Reviewed: I have personally reviewed following labs and imaging studies.  CBC: Recent Labs  Lab 01/03/19 0521 01/04/19 0415 01/05/19 0250 01/06/19 0401 01/07/19 1432  WBC 20.3* 18.9* 17.9* 21.6* 23.4*  NEUTROABS 13.9*  --   --   --  18.9*  HGB 9.5* 9.5* 9.3* 9.1* 9.3*  HCT 30.2* 31.0* 29.6* 27.8* 29.2*  MCV 93.2 93.7 90.5 88.8 89.6  PLT 156 201 231 227 301   Basic Metabolic Panel: Recent Labs  Lab 01/01/19 0409 01/02/19 0100 01/03/19 0521 01/04/19 0415 01/05/19 0250 01/06/19 0401  NA 142 144 143 141 144 141  K 3.5 3.7 4.1 4.2 3.9 3.9  CL 108 112* 113* 109 112* 102  CO2 25 24 22 22 25 28   GLUCOSE 202* 191* 211* 214* 185* 147*  BUN 17 16 17 19  21* 22*  CREATININE 1.14 1.20 1.23 1.30* 1.30* 1.35*  CALCIUM 7.6* 7.8* 7.6* 7.7* 7.9* 7.9*  MG 1.9 2.0 2.1  --  1.9 2.0  PHOS 2.5 2.1* 3.2  --  3.7 4.4   GFR: Estimated Creatinine Clearance: 87.5 mL/min (A) (by C-G formula based on SCr of 1.35 mg/dL (H)). Liver Function Tests: Recent Labs  Lab 01/03/19 0521 01/04/19 0415 01/05/19 0250 01/06/19 0401  AST 26 29 29 31   ALT 16 17 17 20   ALKPHOS 27* 39 38 49  BILITOT 0.5 1.0 1.0 0.6  PROT 4.9* 5.3* 5.4* 5.6*  ALBUMIN 1.2* 1.2* 1.2* 1.2*   No results for input(s): LIPASE, AMYLASE in the last 168 hours. No results for input(s): AMMONIA in the last 168 hours. Coagulation Profile: No results for input(s): INR, PROTIME in the last 168 hours. Cardiac Enzymes: No results for input(s): CKTOTAL, CKMB, CKMBINDEX, TROPONINI in the last 168 hours. BNP (last 3 results) No results for input(s): PROBNP in the last 8760 hours. HbA1C: No results for input(s): HGBA1C in the last 72 hours. CBG: Recent Labs  Lab 01/06/19 2041 01/07/19 0101 01/07/19 0438 01/07/19 0735 01/07/19 1235  GLUCAP 171* 151* 162* 149* 214*   Lipid Profile: No results for input(s): CHOL, HDL, LDLCALC, TRIG, CHOLHDL, LDLDIRECT in the last 72 hours. Thyroid Function Tests: No  results for input(s): TSH, T4TOTAL, FREET4, T3FREE, THYROIDAB in the last 72 hours. Anemia Panel: No results for input(s): VITAMINB12, FOLATE, FERRITIN, TIBC, IRON, RETICCTPCT in the last 72 hours. Sepsis Labs: No results for input(s): PROCALCITON, LATICACIDVEN in the last 168 hours.  Recent Results (from the past 240 hour(s))  Aerobic/Anaerobic Culture (surgical/deep wound)     Status: None (Preliminary result)   Collection Time: 01/04/19  4:00 PM   Specimen: Abscess  Result Value Ref Range Status   Specimen Description ABSCESS PERITONEAL  Final   Special Requests Normal  Final   Gram Stain   Final    MODERATE WBC PRESENT, PREDOMINANTLY PMN RARE GRAM POSITIVE RODS    Culture   Final    RARE GRAM NEGATIVE RODS CULTURE REINCUBATED FOR BETTER GROWTH FEW BACTEROIDES FRAGILIS BETA LACTAMASE POSITIVE Performed at [facility], [address], [place], [place] [address]    Report Status PENDING  Incomplete      Radiology Studies: DG  Elbow 2 Views Right  Result Date: 01/05/2019 CLINICAL DATA:  53 year old male with right elbow pain and swelling. No known injury. EXAM: RIGHT ELBOW - 2 VIEW COMPARISON:  None. FINDINGS: No obvious acute fracture. There is no dislocation. There is however joint effusion with elevation of the anterior and posterior fat pads. An occult fracture involving the radial head is not entirely excluded. Clinical correlation recommended. The bones are well mineralized. No significant arthritic changes. The soft tissues are grossly unremarkable. IMPRESSION: 1. No acute fracture or dislocation. 2. Joint effusion. An occult fracture is not entirely excluded. Clinical correlation recommended. Electronically Signed   By: Elgie Collard M.D.   On: 01/05/2019 21:41     Scheduled Meds: . acetaminophen  650 mg Oral Q6H  . chlorhexidine  15 mL Mouth Rinse BID  . Chlorhexidine Gluconate Cloth  6 each Topical Daily  . digoxin  0.125 mg Intravenous Daily  . feeding  supplement (ENSURE ENLIVE)  237 mL Oral TID BM  . feeding supplement (PRO-STAT SUGAR FREE 64)  30 mL Oral TID BM  . insulin aspart  0-20 Units Subcutaneous Q4H  . mouth rinse  15 mL Mouth Rinse q12n4p  . methocarbamol  500 mg Oral TID  . pantoprazole (PROTONIX) IV  40 mg Intravenous Q24H  . sodium chloride flush  10-40 mL Intracatheter Q12H  . sodium chloride flush  5 mL Intracatheter Q8H   Continuous Infusions: . sodium chloride Stopped (12/20/18 1319)  . sodium chloride    . amiodarone 30 mg/hr (01/07/19 1442)  . fluconazole (DIFLUCAN) IV 400 mg (01/07/19 1433)  . heparin 3,200 Units/hr (01/07/19 1414)  . piperacillin-tazobactam (ZOSYN)  IV 3.375 g (01/07/19 1429)  . TPN ADULT (ION) 40 mL/hr at 01/06/19 1757  . TPN ADULT (ION)       LOS: 18 days    Time spent: 25 minutes spent in the coordination of care today.    Teddy Spike, DO [facility]  If 7PM-7AM, please contact night-coverage [email or web address] 01/07/2019, 3:19 PM

## 2019-01-07 NOTE — Progress Notes (Addendum)
PHARMACY - TOTAL PARENTERAL NUTRITION CONSULT NOTE  Indication: massive bowel resection  Patient Measurements: Height: 5\' 7"  (170.2 cm) Weight: (!) 320 lb 8.8 oz (145.4 kg) IBW/kg (Calculated) : 66.1 TPN AdjBW (KG): 86.1 Body mass index is 50.21 kg/m.  Assessment:  59 yom presented to the hospital with abdominal pain found to be acute diverticulitis. He was treated conservatively but without improvement. He was taken to the OR 12/15 and found to have a perforated cecum with leakage of stool throughout the abdominal cavity. He remains on the ventilator with vasopressors. Patient has been NPO or clear liquids since admission so he may be at risk for refeeding syndrome.   Glucose / Insulin: A1c 6.3% - CBGs improved (149-171),  17 units SSI last 24 hrs  Electrolytes: within normal limits on 12/24 Renal: 12/24: SCr 1.35, BUN 22 LFTs / TGs: LFTs / tbili / TG within normal limits on 12/24 Prealbumin / albumin: prealbumin 6.1, albumin 1.2 Intake / Output; MIVF: drain O/P 36mL, ileostomy O/P 431ml, UOP not all recorded GI Imaging: 12/14 Abd xray: Persistent bowel dilatation. No free air demonstrable 12/14 CT abd: Worsening distal large-bowel obstruction at the level of the descending colon correlating with an approximately 5 cm in length segment of irregular annular wall thickening with underlying colonic diverticulosis and mild pericolonic fat stranding 12/9 Abd xray: Worsening gaseous distension of the proximal colon and now involving much of the small bowel Surgeries / Procedures:  12/22 IR drainage of right peritoneal abscess 12/15 Exlap, subtotal colectomy and end ileostomy 12/10 Flexible sigmoidoscopy  Central access: PICC 12/28/18 TPN start date: 12/29/18  Nutritional Goals (per RD rec on 12/17): KCal: 2200-2400, Protein: 130-150, Fluid: >/=1.8 L/day  Current Nutrition:  TPN  Heart healthy/carb modified diet - low intake Ensure Enlive  Plan:  Continue reduced TPN (per  discussion with surgery) at 40 ml/hr, which will provide 74g AA, 150g CHO and 38g ILE for a total of 1188 kCal, meeting ~50% of patient needs Electrolytes in TPN: no change Add standard MVI MWF and trace elements daily to TPN Continue resistant SSI Q4H + maintain 10 units insulin in TPN since CBGs are trending down this morning F/U PO intake and ability to stop TPN soon  Sloan Leiter, PharmD, BCPS, BCCCP Clinical Pharmacist Please refer to [facility] for [place] numbers 01/07/2019 10:01 AM

## 2019-01-07 NOTE — Progress Notes (Signed)
[facility] Progress Note  10 Days Post-Op  Subjective: CC-  Feeling ok this morning. Abdominal pain about the same as yesterday. Denies n/v. States that he fell asleep before dinner last night, but that he tolerated an Ensure later in the evening. Ordering breakfast now. Ostomy functioning.  He did have a low grade temp 100.7 over night. Labs pending.  Objective: Vital signs in last 24 hours: Temp:  [98.6 F (37 C)-100.7 F (38.2 C)] 98.6 F (37 C) (12/25 0427) Pulse Rate:  [79-95] 81 (12/25 0427) Resp:  [23-31] 23 (12/25 0427) BP: (96-110)/(52-60) 107/60 (12/25 0427) SpO2:  [90 %-97 %] 91 % (12/25 0427) Weight:  [145.4 kg] 145.4 kg (12/25 0427) Last BM Date: 01/05/19  Intake/Output from previous day: 12/24 0701 - 12/25 0700 In: 1458.9 [I.V.:1283.4; IV Piggyback:175.5] Out: 1175 [Urine:475; Drains:250; Stool:450] Intake/Output this shift: No intake/output data recorded.  PE: Gen: Alert, NAD, pleasant Card: Regular rate and rhythm Pulm: rate and effort normal Abd: obese, soft,ostomy viable with liquid stool in pouch, drain in LLQ with SS fluid,drain on R side with some purulent material,midline wound dehisced, no bowel mushrooming out of wound but able to see some peristalsis, some necrotic tissue mostly at 9 o'clock position, faint foul odor     Lab Results:  Recent Labs    01/05/19 0250 01/06/19 0401  WBC 17.9* 21.6*  HGB 9.3* 9.1*  HCT 29.6* 27.8*  PLT 231 227   BMET Recent Labs    01/05/19 0250 01/06/19 0401  NA 144 141  K 3.9 3.9  CL 112* 102  CO2 25 28  GLUCOSE 185* 147*  BUN 21* 22*  CREATININE 1.30* 1.35*  CALCIUM 7.9* 7.9*   PT/INR No results for input(s): LABPROT, INR in the last 72 hours. CMP     Component Value Date/Time   NA 141 01/06/2019 0401   NA 140 08/26/2018 1355   K 3.9 01/06/2019 0401   CL 102 01/06/2019 0401   CO2 28 01/06/2019 0401   GLUCOSE 147 (H) 01/06/2019 0401   BUN 22 (H) 01/06/2019 0401   BUN 14  08/26/2018 1355   CREATININE 1.35 (H) 01/06/2019 0401   CREATININE 1.20 10/29/2016 1236   CALCIUM 7.9 (L) 01/06/2019 0401   PROT 5.6 (L) 01/06/2019 0401   PROT 7.4 12/03/2018 1459   ALBUMIN 1.2 (L) 01/06/2019 0401   ALBUMIN 3.6 (L) 12/03/2018 1459   AST 31 01/06/2019 0401   ALT 20 01/06/2019 0401   ALKPHOS 49 01/06/2019 0401   BILITOT 0.6 01/06/2019 0401   BILITOT 0.3 12/03/2018 1459   GFRNONAA 60 (L) 01/06/2019 0401   GFRAA >60 01/06/2019 0401   Lipase     Component Value Date/Time   LIPASE 27 12/19/2018 2038       Studies/Results: DG Elbow 2 Views Right  Result Date: 01/05/2019 CLINICAL DATA:  53 year old male with right elbow pain and swelling. No known injury. EXAM: RIGHT ELBOW - 2 VIEW COMPARISON:  None. FINDINGS: No obvious acute fracture. There is no dislocation. There is however joint effusion with elevation of the anterior and posterior fat pads. An occult fracture involving the radial head is not entirely excluded. Clinical correlation recommended. The bones are well mineralized. No significant arthritic changes. The soft tissues are grossly unremarkable. IMPRESSION: 1. No acute fracture or dislocation. 2. Joint effusion. An occult fracture is not entirely excluded. Clinical correlation recommended. Electronically Signed   By: Elgie Collard M.D.   On: 01/05/2019 21:41    Anti-infectives: Anti-infectives (  From admission, onward)   Start     Dose/Rate Route Frequency Ordered Stop   12/28/18 1400  fluconazole (DIFLUCAN) IVPB 400 mg     400 mg 100 mL/hr over 120 Minutes Intravenous Every 24 hours 12/28/18 1318     12/20/18 2200  piperacillin-tazobactam (ZOSYN) IVPB 3.375 g     3.375 g 12.5 mL/hr over 240 Minutes Intravenous Every 8 hours 12/20/18 1834     12/20/18 0645  piperacillin-tazobactam (ZOSYN) IVPB 3.375 g  Status:  Discontinued     3.375 g 100 mL/hr over 30 Minutes Intravenous Every 6 hours 12/20/18 0630 12/20/18 1833   12/19/18 2315   piperacillin-tazobactam (ZOSYN) IVPB 3.375 g     3.375 g 100 mL/hr over 30 Minutes Intravenous  Once 12/19/18 2302 12/20/18 0003       Assessment/Plan CHF EF 25-30% Atrial fibrillation Hx DVT/PE LV mural thrombus Gross anticoagulant Morbid obesityBMI 50.63 AKI on CKD Dehydration R elbow pain - primary team ordering films  POD#10STC/ileostomy fordiverticular stricture of left colon and perforated R colon - pathwithout evidence of malignancy -s/p drain placement 12/22 - cxs pending, continue IV abx - ileostomy functioning, tolerating a diet although has not been taking in very much yet. Continue protein supplements - wound dehisced, monitor closely. BID wet to dry dressing changes - abdominal binder - bedrest until ~14 days postop to hopefully avoid further dehiscence/evisceration of wound  FEN:1/2 TPN, HH/CM diet, ensure, prostat VTE: heparin gtt ID: IV Zosyn 12/6>>, IV diflucan 12/15>>   LOS: 18 days    Wellington Hampshire, [facility] 01/07/2019, 9:59 AM Please see Amion for pager number during day hours 7:00am-4:30pm

## 2019-01-07 NOTE — Progress Notes (Signed)
ANTICOAGULATION CONSULT NOTE - Follow Up Consult  Pharmacy Consult for heparin Indication: h/o VTE, LV mural thrombus, and Afib  Labs: Recent Labs    01/05/19 0250 01/06/19 0401 01/07/19 1432 01/07/19 2200  HGB 9.3* 9.1* 9.3*  --   HCT 29.6* 27.8* 29.2*  --   PLT 231 227 301  --   HEPARINUNFRC 0.29* 0.54 0.26* 0.40  CREATININE 1.30* 1.35*  --   --     Assessment/Plan:  53yo male therapeutic on heparin after rate change. Will continue gtt at current rate and confirm stable with am labs.   Wynona Neat, PharmD, BCPS  01/07/2019,11:28 PM

## 2019-01-07 NOTE — Progress Notes (Signed)
[place] for Heparin  Indication: hx VTE, LV mural thrombus, afib   Allergies  Allergen Reactions  . Shrimp [Shellfish Allergy] Hives and Itching    Patient Measurements: Height: 5\' 7"  (170.2 cm) Weight: (!) 320 lb 8.8 oz (145.4 kg) IBW/kg (Calculated) : 66.1 Heparin Dosing Weight:  101.6 kg  Vital Signs: Temp: 98.6 F (37 C) (12/25 0427) Temp Source: Oral (12/25 0427) BP: 107/60 (12/25 0427) Pulse Rate: 81 (12/25 0427)  Labs: Recent Labs    01/05/19 0250 01/05/19 1432 01/06/19 0401 01/07/19 1432  HGB 9.3*  --  9.1* 9.3*  HCT 29.6*  --  27.8* 29.2*  PLT 231  --  227 301  HEPARINUNFRC 0.29* 0.58 0.54 0.26*  CREATININE 1.30*  --  1.35*  --     Estimated Creatinine Clearance: 87.5 mL/min (A) (by C-G formula based on SCr of 1.35 mg/dL (H)).   Assessment: 53 y.o. male with h/o Afib and LV thrombus, Eliquis on hold, for IV heparin.  Heparin level is slightly subtherapeutic at 0.26 on heparin rate of 3200 units/hr. No bleeding noted, Hgb 9.3 and fairly stable, platelets are normal. Heparin infusing in PICC line and the heparin level was drawn peripherally from left hand.   Goal of Therapy:  Heparin level 0.3-0.7 units/ml Monitor platelets by anticoagulation protocol: Yes   Plan:  Increase heparin to 3300 units/hr Daily heparin level and CBC Monitor for s/sx of bleeding F/u plans to resume oral anticoagulation as able.  Sherren Kerns, PharmD PGY1 Acute Care Pharmacy Resident  01/07/2019 3:37 PM

## 2019-01-07 NOTE — Progress Notes (Signed)
Progress Note  Patient Name: Roberto [name] Date of Encounter: 01/07/2019  Primary Cardiologist: Arvilla Meres, MD   Subjective   Denies chest pain and shortness of breath. Has soreness at site of abdominal surgery. [name] he has been taking pills and eating some food. Hospitalist notes indicate he may be returning to OR for surgery on right elbow.  Inpatient Medications    Scheduled Meds: . acetaminophen  650 mg Oral Q6H  . chlorhexidine  15 mL Mouth Rinse BID  . Chlorhexidine Gluconate Cloth  6 each Topical Daily  . digoxin  0.125 mg Intravenous Daily  . feeding supplement (ENSURE ENLIVE)  237 mL Oral TID BM  . feeding supplement (PRO-STAT SUGAR FREE 64)  30 mL Oral TID BM  . insulin aspart  0-20 Units Subcutaneous Q4H  . mouth rinse  15 mL Mouth Rinse q12n4p  . methocarbamol  500 mg Oral TID  . pantoprazole (PROTONIX) IV  40 mg Intravenous Q24H  . sodium chloride flush  10-40 mL Intracatheter Q12H  . sodium chloride flush  5 mL Intracatheter Q8H   Continuous Infusions: . sodium chloride Stopped (12/20/18 1319)  . sodium chloride    . amiodarone 30 mg/hr (01/07/19 0233)  . fluconazole (DIFLUCAN) IV 400 mg (01/06/19 1430)  . heparin 3,200 Units/hr (01/07/19 0324)  . piperacillin-tazobactam (ZOSYN)  IV 3.375 g (01/07/19 0555)  . TPN ADULT (ION) 40 mL/hr at 01/06/19 1757   PRN Meds: sodium chloride, alum & mag hydroxide-simeth, morphine injection, ondansetron (ZOFRAN) IV, oxyCODONE, sodium chloride flush   Vital Signs    Vitals:   01/06/19 2044 01/07/19 0100 01/07/19 0109 01/07/19 0427  BP: (!) 97/57 96/60  107/60  Pulse: 85   81  Resp: (!) 31 (!) 24  (!) 23  Temp: (!) 100.7 F (38.2 C)  98.7 F (37.1 C) 98.6 F (37 C)  TempSrc: Oral  Oral Oral  SpO2: 90% 92%  91%  Weight:    (!) 145.4 kg  Height:        Intake/Output Summary (Last 24 hours) at 01/07/2019 0847 Last data filed at 01/07/2019 0700 Gross per 24 hour  Intake 1458.91 ml  Output 1175 ml  Net  283.91 ml   Filed Weights   01/03/19 1610 01/05/19 0536 01/07/19 0427  Weight: (!) 156.9 kg (!) 149.3 kg (!) 145.4 kg    Telemetry    NSR with PVC's - Personally Reviewed   Physical Exam   GEN: No acute distress. Obese. Neck: No JVD Cardiac: RRR, no murmurs, rubs, or gallops.  Respiratory: Clear to auscultation bilaterally. GI: Soft, non-distended  MS: Chronic 1+ b/l LE edema; No deformity. Neuro:  Nonfocal  Psych: Normal affect   Labs    Chemistry Recent Labs  Lab 01/04/19 0415 01/05/19 0250 01/06/19 0401  NA 141 144 141  K 4.2 3.9 3.9  CL 109 112* 102  CO2 22 25 28   GLUCOSE 214* 185* 147*  BUN 19 21* 22*  CREATININE 1.30* 1.30* 1.35*  CALCIUM 7.7* 7.9* 7.9*  PROT 5.3* 5.4* 5.6*  ALBUMIN 1.2* 1.2* 1.2*  AST 29 29 31   ALT 17 17 20   ALKPHOS 39 38 49  BILITOT 1.0 1.0 0.6  GFRNONAA >60 >60 60*  GFRAA >60 >60 >60  ANIONGAP 10 7 11      Hematology Recent Labs  Lab 01/04/19 0415 01/05/19 0250 01/06/19 0401  WBC 18.9* 17.9* 21.6*  RBC 3.31* 3.27* 3.13*  HGB 9.5* 9.3* 9.1*  HCT 31.0* 29.6* 27.8*  MCV 93.7 90.5 88.8  MCH 28.7 28.4 29.1  MCHC 30.6 31.4 32.7  RDW 15.8* 15.5 15.6*  PLT 201 231 227    Cardiac EnzymesNo results for input(s): TROPONINI in the last 168 hours. No results for input(s): TROPIPOC in the last 168 hours.   BNPNo results for input(s): BNP, PROBNP in the last 168 hours.   DDimer No results for input(s): DDIMER in the last 168 hours.   Radiology    DG Elbow 2 Views Right  Result Date: 01/05/2019 CLINICAL DATA:  53 year old male with right elbow pain and swelling. No known injury. EXAM: RIGHT ELBOW - 2 VIEW COMPARISON:  None. FINDINGS: No obvious acute fracture. There is no dislocation. There is however joint effusion with elevation of the anterior and posterior fat pads. An occult fracture involving the radial head is not entirely excluded. Clinical correlation recommended. The bones are well mineralized. No significant arthritic  changes. The soft tissues are grossly unremarkable. IMPRESSION: 1. No acute fracture or dislocation. 2. Joint effusion. An occult fracture is not entirely excluded. Clinical correlation recommended. Electronically Signed   By: Elgie Collard M.D.   On: 01/05/2019 21:41    Cardiac Studies   Echocardiogram 07/20/2018 IMPRESSIONS  1. The left ventricle has severely reduced systolic function, with an ejection fraction of 25-30%. The cavity size was moderately dilated. Left ventricular diastolic Doppler parameters are consistent with pseudonormalization. Left ventricular diffuse  hypokinesis. 2. The right ventricle has normal systolic function. The cavity was normal. There is no increase in right ventricular wall thickness. 3. Left atrial size was mildly dilated. 4. No evidence of mitral valve stenosis. Trivial mitral regurgitation. 5. The aortic valve is tricuspid. No stenosis of the aortic valve. 6. The aortic root is normal in size and structure. 7. The inferior vena cava was dilated in size with <50% respiratory variability. No complete TR doppler jet so unable to estimate PA systolic pressure.  Patient Profile     53 y.o. male with a hx of chronic diastolic HF,h/o ofLv thrombus, nonischemic cardiomyopathy (EF 25-30% 2013, EF 2020 25-30%), ICD placed 08/2018, PSVT (elected for medical therapy over RFA), DVT/PE, Paroxysmal afib on amiodarone, Aflutter ablation 11/2016, lupus, and morbid obesityinitially seen for preop evaluation in setting of abdominal pain/acute diverticulitis. Followed for hx of CHF.  Assessment & Plan    Acute diverticulitis/Colon Mass/ Sepsis Patient was admitted 12/7 for acute diverticulitis and was being treated medically but did not improve. 12/15 he was taken to the OR found to have perforated cecum and surgery found a mass. In the OR patient became hypotensive and required pressors. Patient was intubated and transferred to ICU for sepsis, place on IV abx.  Patient was extubated 12/16. - Path from colon mass shows benign colonic mucosa with mild reactive changes, no active inflammation, no malignancy - Still on TPN - Off pressors - Management per CCM/IM -s/p drainageof large abcess  Chronic systolic HF (LVEF 25-30%) - Followed by Advanced Heart Failure team - At baseline was on Entresto, spironolactone, and Torsemide. All were held during admission - Digoxin was continued. Level0.5 several days ago. Recheck ordered. - Patient was started on IV Lasix 40 mg BID - creatinine 1.30 > 1.35 on 12/24. BMET from today pending - He put 445 cc thus far today. - Weight down to 320 lbs - His weight at last AHF clinic a year ago was 301lbs and with EP last month was 333lbs (felt to be euvolemic at that time) - Patient has 1+  edema on exam, some of which appears chronic.Hold diuretics today. - follow I&Os and daily weights - restart Entrestowhentaking PO   Paroxysmal Afib - Had Aflutter ablation 2018 - On amiodarone at home.  - Eliquis for stroke prevention>>was held for surgery and placed on heparin.  - change Heparin back to Eliquis when OK the surgery -Patient has been maintaining NSRbut having a lot of ventricular ectopy - started on Amio IV as PO Amio was on hold due to NPO   Ventricular ectopy - PVCs and short runs of NSVT vs. PAT with aberration - K+ 3.9 and Mag 2 on 12/24. Today's BMET pending. - since not taking PO and had been on Amio at home, StartedIV Amio gtt for suppression of possible atrial tach with aberration vs. VT and plan is to change back to home dose of Amio once taking PO consistently      For questions or updates, please contact [place] Please consult [email or web address] for contact info under Cardiology/STEMI.      Signed, Kate Sable, MD  01/07/2019, 8:47 AM

## 2019-01-07 NOTE — Plan of Care (Signed)

## 2019-01-08 LAB — CBC WITH DIFFERENTIAL/PLATELET
Abs Immature Granulocytes: 1.25 10*3/uL — ABNORMAL HIGH (ref 0.00–0.07)
Basophils Absolute: 0.1 10*3/uL (ref 0.0–0.1)
Basophils Relative: 1 %
Eosinophils Absolute: 0.4 10*3/uL (ref 0.0–0.5)
Eosinophils Relative: 2 %
HCT: 27.2 % — ABNORMAL LOW (ref 39.0–52.0)
Hemoglobin: 8.8 g/dL — ABNORMAL LOW (ref 13.0–17.0)
Immature Granulocytes: 6 %
Lymphocytes Relative: 10 %
Lymphs Abs: 2.2 10*3/uL (ref 0.7–4.0)
MCH: 28.9 pg (ref 26.0–34.0)
MCHC: 32.4 g/dL (ref 30.0–36.0)
MCV: 89.2 fL (ref 80.0–100.0)
Monocytes Absolute: 1.1 10*3/uL — ABNORMAL HIGH (ref 0.1–1.0)
Monocytes Relative: 5 %
Neutro Abs: 16.8 10*3/uL — ABNORMAL HIGH (ref 1.7–7.7)
Neutrophils Relative %: 76 %
Platelets: 287 10*3/uL (ref 150–400)
RBC: 3.05 MIL/uL — ABNORMAL LOW (ref 4.22–5.81)
RDW: 15.4 % (ref 11.5–15.5)
WBC: 21.8 10*3/uL — ABNORMAL HIGH (ref 4.0–10.5)
nRBC: 0.2 % (ref 0.0–0.2)

## 2019-01-08 LAB — COMPREHENSIVE METABOLIC PANEL
ALT: 15 U/L (ref 0–44)
AST: 21 U/L (ref 15–41)
Albumin: 1.2 g/dL — ABNORMAL LOW (ref 3.5–5.0)
Alkaline Phosphatase: 44 U/L (ref 38–126)
Anion gap: 9 (ref 5–15)
BUN: 25 mg/dL — ABNORMAL HIGH (ref 6–20)
CO2: 29 mmol/L (ref 22–32)
Calcium: 7.1 mg/dL — ABNORMAL LOW (ref 8.9–10.3)
Chloride: 98 mmol/L (ref 98–111)
Creatinine, Ser: 1.45 mg/dL — ABNORMAL HIGH (ref 0.61–1.24)
GFR calc Af Amer: >60 mL/min (ref >60)
GFR calc non Af Amer: 55 mL/min — ABNORMAL LOW (ref >60)
Glucose, Bld: 180 mg/dL — ABNORMAL HIGH (ref 70–99)
Potassium: 4.3 mmol/L (ref 3.5–5.1)
Sodium: 136 mmol/L (ref 135–145)
Total Bilirubin: 0.2 mg/dL — ABNORMAL LOW (ref 0.3–1.2)
Total Protein: 5.8 g/dL — ABNORMAL LOW (ref 6.5–8.1)

## 2019-01-08 LAB — MAGNESIUM: Magnesium: 2.3 mg/dL (ref 1.7–2.4)

## 2019-01-08 LAB — HEPARIN LEVEL (UNFRACTIONATED): Heparin Unfractionated: 0.64 IU/mL (ref 0.30–0.70)

## 2019-01-08 LAB — GLUCOSE, CAPILLARY
Glucose-Capillary: 156 mg/dL — ABNORMAL HIGH (ref 70–99)
Glucose-Capillary: 167 mg/dL — ABNORMAL HIGH (ref 70–99)
Glucose-Capillary: 187 mg/dL — ABNORMAL HIGH (ref 70–99)
Glucose-Capillary: 190 mg/dL — ABNORMAL HIGH (ref 70–99)
Glucose-Capillary: 192 mg/dL — ABNORMAL HIGH (ref 70–99)
Glucose-Capillary: 232 mg/dL — ABNORMAL HIGH (ref 70–99)

## 2019-01-08 MED ORDER — TRACE MINERALS CU-MN-SE-ZN 300-55-60-3000 MCG/ML IV SOLN
INTRAVENOUS | Status: AC
  Filled 2019-01-08: qty 492.8

## 2019-01-08 MED ORDER — SODIUM CHLORIDE 0.9 % IV SOLN: Freq: Once | INTRAVENOUS | Status: AC

## 2019-01-08 NOTE — Progress Notes (Signed)
Progress Note  Patient Name: Roberto [name] Date of Encounter: 01/08/2019  Primary Cardiologist: Glori Bickers, MD   Subjective   No chest pain or shortness of breath.  Continuing on heparin infusion amiodarone infusion.  Taking some p.o., breakfast was on his tray.  Appetite is not great he states.  Inpatient Medications    Scheduled Meds: . acetaminophen  650 mg Oral Q6H  . chlorhexidine  15 mL Mouth Rinse BID  . Chlorhexidine Gluconate Cloth  6 each Topical Daily  . digoxin  0.125 mg Intravenous Daily  . feeding supplement (ENSURE ENLIVE)  237 mL Oral TID BM  . feeding supplement (PRO-STAT SUGAR FREE 64)  30 mL Oral TID BM  . insulin aspart  0-20 Units Subcutaneous Q4H  . mouth rinse  15 mL Mouth Rinse q12n4p  . methocarbamol  500 mg Oral TID  . pantoprazole (PROTONIX) IV  40 mg Intravenous Q24H  . sodium chloride flush  10-40 mL Intracatheter Q12H  . sodium chloride flush  5 mL Intracatheter Q8H   Continuous Infusions: . sodium chloride Stopped (12/20/18 1319)  . sodium chloride    . amiodarone 30 mg/hr (01/07/19 2233)  . fluconazole (DIFLUCAN) IV 400 mg (01/07/19 1433)  . heparin 3,300 Units/hr (01/08/19 0442)  . piperacillin-tazobactam (ZOSYN)  IV 3.375 g (01/08/19 0441)  . TPN ADULT (ION) 40 mL/hr at 01/08/19 0600  . TPN ADULT (ION)     PRN Meds: sodium chloride, alum & mag hydroxide-simeth, morphine injection, ondansetron (ZOFRAN) IV, oxyCODONE, sodium chloride flush   Vital Signs    Vitals:   01/07/19 2021 01/08/19 0010 01/08/19 0510 01/08/19 0734  BP: (!) 97/56 (!) 92/59 (!) 99/58 (!) 95/50  Pulse:    72  Resp: 18 (!) 26 (!) 22   Temp: 99.3 F (37.4 C) 99.1 F (37.3 C) 98.1 F (36.7 C) 99.1 F (37.3 C)  TempSrc: Oral Oral Oral Oral  SpO2: 96% 91% 90% 95%  Weight:   (!) 146.7 kg   Height:        Intake/Output Summary (Last 24 hours) at 01/08/2019 0943 Last data filed at 01/08/2019 0600 Gross per 24 hour  Intake 3776.6 ml  Output 1660 ml    Net 2116.6 ml   Filed Weights   01/05/19 0536 01/07/19 0427 01/08/19 0510  Weight: (!) 149.3 kg (!) 145.4 kg (!) 146.7 kg    Telemetry    Normal sinus rhythm with occasional PVCs- Personally Reviewed   Physical Exam   GEN: Well nourished, well developed, in no acute distress  HEENT: normal  Neck: no JVD, carotid bruits, or masses Cardiac: RRR; no murmurs, rubs, or gallops, 1+ bilateral edema  Respiratory:  clear to auscultation bilaterally, normal work of breathing GI: Abdominal binder in place MS: no deformity or atrophy  Skin: warm and dry, no rash Neuro:  Alert and Oriented x 3, Strength and sensation are intact Psych: euthymic mood, full affect   Labs    Chemistry Recent Labs  Lab 01/05/19 0250 01/06/19 0401 01/08/19 0505  NA 144 141 136  K 3.9 3.9 4.3  CL 112* 102 98  CO2 25 28 29   GLUCOSE 185* 147* 180*  BUN 21* 22* 25*  CREATININE 1.30* 1.35* 1.45*  CALCIUM 7.9* 7.9* 7.1*  PROT 5.4* 5.6* 5.8*  ALBUMIN 1.2* 1.2* 1.2*  AST 29 31 21   ALT 17 20 15   ALKPHOS 38 49 44  BILITOT 1.0 0.6 0.2*  GFRNONAA >60 60* 55*  GFRAA >60 >60 >  60  ANIONGAP 7 11 9      Hematology Recent Labs  Lab 01/06/19 0401 01/07/19 1432 01/08/19 0505  WBC 21.6* 23.4* 21.8*  RBC 3.13* 3.26* 3.05*  HGB 9.1* 9.3* 8.8*  HCT 27.8* 29.2* 27.2*  MCV 88.8 89.6 89.2  MCH 29.1 28.5 28.9  MCHC 32.7 31.8 32.4  RDW 15.6* 15.5 15.4  PLT 227 301 287    Cardiac EnzymesNo results for input(s): TROPONINI in the last 168 hours. No results for input(s): TROPIPOC in the last 168 hours.   BNPNo results for input(s): BNP, PROBNP in the last 168 hours.   DDimer No results for input(s): DDIMER in the last 168 hours.   Radiology    No results found.  Cardiac Studies   Echocardiogram 07/20/2018 IMPRESSIONS  1. The left ventricle has severely reduced systolic function, with an ejection fraction of 25-30%. The cavity size was moderately dilated. Left ventricular diastolic Doppler parameters  are consistent with pseudonormalization. Left ventricular diffuse  hypokinesis. 2. The right ventricle has normal systolic function. The cavity was normal. There is no increase in right ventricular wall thickness. 3. Left atrial size was mildly dilated. 4. No evidence of mitral valve stenosis. Trivial mitral regurgitation. 5. The aortic valve is tricuspid. No stenosis of the aortic valve. 6. The aortic root is normal in size and structure. 7. The inferior vena cava was dilated in size with <50% respiratory variability. No complete TR doppler jet so unable to estimate PA systolic pressure.  Patient Profile     53 y.o. male with a hx of chronic diastolic HF,h/o ofLv thrombus, nonischemic cardiomyopathy (EF 25-30% 2013, EF 2020 25-30%), ICD placed 08/2018, PSVT (elected for medical therapy over RFA), DVT/PE, Paroxysmal afib on amiodarone, Aflutter ablation 11/2016, lupus, and morbid obesityinitially seen for preop evaluation in setting of abdominal pain/acute diverticulitis. Followed for hx of CHF.  Assessment & Plan    Acute diverticulitis/Colon Mass/ Sepsis Patient was admitted 12/7 for acute diverticulitis and was being treated medically but did not improve. 12/15 he was taken to the OR found to have perforated cecum and surgery found a mass. In the OR patient became hypotensive and required pressors. Patient was intubated and transferred to ICU for sepsis, place on IV abx. Patient was extubated 12/16. - Path from colon mass shows benign colonic mucosa with mild reactive changes, no active inflammation, no malignancy -off TPN - Off pressors, relatively soft blood pressures -Surgery note reviewed -s/p drainageof large abcess  Chronic systolic HF (LVEF 25-30%) - Followed by Advanced Heart Failure team - At baseline was on Entresto, spironolactone, and Torsemide. All were held during admission - Digoxin was continued. Level0.5 several days ago.  - Patient was started on IV  Lasix 40 mg BID which was subsequently held on 01/07/2019 -Creatinine did increase today to 1.45 up from 1.35. - Weight down to 323 lbs - His weight at last AHF clinic a year ago was 301lbs and with EP last month was 333lbs (felt to be euvolemic at that time) - Patient has 1+ edema on exam, some of which appears chronic.  - follow I&Os and daily weights - restart Entrestowhentaking p.o. intake improves, question tomorrow.  Morbid obesity with BMI 54.  ICD in place -Functioning well.  Paroxysmal Afib - Had Aflutter ablation 2018 - On amiodarone at home, currently on amiodarone IV secondary to ventricular ectopy noted on telemetry.  Remember, patient is n.p.o. - Eliquis for stroke prevention>>was held for surgery and placed on heparin.  - change  Heparin back to Eliquis when OK the surgery.  Prior history of DVT PE LV thrombus  Ventricular ectopy - PVCs and short runs of NSVT vs. PAT with aberration -Keep K greater than 4 mag greater than 2 . -On amio IV.  Change to p.o. once able.  He was taking this at home.    For questions or updates, please contact [facility] Please consult [email or web address] for contact info under Cardiology/STEMI.      Signed, Donato Schultz, MD  01/08/2019, 9:43 AM

## 2019-01-08 NOTE — Progress Notes (Signed)
Dr. Marylyn Ishihara updated with trending lower BP in the 80's,  curently at 83/40  . Pt asymptomatic. New order for Normal saline x 1 Liter to infuse at 75 cc/hr.

## 2019-01-08 NOTE — Progress Notes (Signed)
[name]  PROGRESS NOTE    KYEN TAITE  WUJ:[id number] DOB: 1965-09-16 DOA: 12/19/2018 PCP: Denita Lung, MD   Brief Narrative:   Pt. With history of NICM, EF 25-30%, PSVT, PAF, chronic systolic HF with AICD, hx of DVT/PE/LV thrombus , on chronic anticoagulation ( bridged with heparin), was admitted 12/20/2018 for acute diverticulitis. He was initially treated conservatively, but had no real improvement and was taken to the OR 12/28/2018. Pt had Left colonic stricture of unknown etiology, and perforated cecum. Per surgery's note there was a mass in the mid descending colon. The right colon was ischemic and had perforated. There was visualization of leakage of a large amount of stool throughout his entire abdominal cavity. In OR patient became hypotensive, required pressors (Phenylephrine and Levo). Surgery felt the patient was not ready for extubation, and considering co morbidities and possible septic insult asked PCCM to admit to ICU manage care. Transferred to [facility] service on 01/03/2019 now off pressors.  01/08/19:In good spirits today. Still on abx, will d/w surg duration. Otherwise, denies complaints.     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 Kathlene Cote. - Management per interventional radiology; appreciate assistance  Acute hypoxic respiratory failure possibly secondary to postop atelectasis - Normal oxygen supplementation at baseline - on RA  Chronic combined systolic and diastolic CHF - Last 2D echo LVEF 25 to 30% - Status post AICD - watch daily wts; essentially fluid neutral for stay     - will entresto when stabilizes  Paroxysmal A. fib History of DVT/PE/LV thrombus - continue digoxin, amiodarone and heparin drip - Cardiology following; appreciate assistance     - eliquis when ok w/ surgery  Morbid obesity - BMI 54 - Recommend weight loss outpatient once stable  Right elbow pain -films noted; will speak with ortho  DVT prophylaxis: heparin Code Status: FULL   Disposition Plan: TBD  Consultants:   Cardiology  General Surgery  IR  Antimicrobials:  . Zosyn, diflucan   Subjective: "This is a little weird."  Objective: Vitals:   01/08/19 0010 01/08/19 0510 01/08/19 0734 01/08/19 1254  BP: (!) 92/59 (!) 99/58 (!) 95/50 (!) 94/58  Pulse:   72 71  Resp: (!) 26 (!) 22    Temp: 99.1 F (37.3 C) 98.1 F (36.7 C) 99.1 F (37.3 C) 98 F (36.7 C)  TempSrc: Oral Oral Oral Oral  SpO2: 91% 90% 95% 96%  Weight:  (!) 146.7 kg    Height:        Intake/Output Summary (Last 24 hours) at 01/08/2019 1328 Last data filed at 01/08/2019  1610 Gross per 24 hour  Intake 3776.6 ml  Output 2010 ml  Net 1766.6 ml   Filed Weights   01/05/19 0536 01/07/19 0427 01/08/19 0510  Weight: (!) 149.3 kg (!) 145.4 kg (!) 146.7 kg    Examination:  General: 53 y.o. male resting in bed in NAD Cardiovascular: RRR, +S1, S2, no m/g/r Respiratory:  CTABL, no w/r/r, normal WOB GI: BS+, NDNT, ab binder, ostomy MSK: No e/c/c Neuro: A&O x 3, no focal deficits Psyc: Appropriate interaction and affect, calm/cooperative   Data Reviewed: I have personally reviewed following labs and imaging studies.  CBC: Recent Labs  Lab 01/03/19 0521 01/04/19 0415 01/05/19 0250 01/06/19 0401 01/07/19 1432 01/08/19 0505  WBC 20.3* 18.9* 17.9* 21.6* 23.4* 21.8*  NEUTROABS 13.9*  --   --   --  18.9* 16.8*  HGB 9.5* 9.5* 9.3* 9.1* 9.3* 8.8*  HCT 30.2* 31.0* 29.6* 27.8* 29.2* 27.2*  MCV 93.2 93.7 90.5 88.8 89.6 89.2  PLT 156 201 231 227 301 287   Basic Metabolic Panel: Recent Labs  Lab 01/02/19 0100 01/03/19 0521 01/04/19 0415 01/05/19 0250 01/06/19 0401 01/07/19 1432 01/08/19 0505  NA 144 143 141 144 141  --  136  K 3.7 4.1 4.2 3.9 3.9  --  4.3  CL 112* 113* 109 112* 102  --  98  CO2 24 22 22 25 28   --  29  GLUCOSE 191* 211* 214* 185* 147*  --  180*  BUN 16 17 19  21* 22*  --  25*  CREATININE 1.20 1.23 1.30* 1.30* 1.35*  --  1.45*  CALCIUM 7.8* 7.6* 7.7* 7.9* 7.9*  --  7.1*  MG 2.0 2.1  --  1.9 2.0 2.2 2.3  PHOS 2.1* 3.2  --  3.7 4.4  --   --    GFR: Estimated Creatinine Clearance: 81.9 mL/min (A) (by C-G formula based on SCr of 1.45 mg/dL (H)). Liver Function Tests: Recent Labs  Lab 01/03/19 0521 01/04/19 0415 01/05/19 0250 01/06/19 0401 01/08/19 0505  AST 26 29 29 31 21   ALT 16 17 17 20 15   ALKPHOS 27* 39 38 49 44  BILITOT 0.5 1.0 1.0 0.6 0.2*  PROT 4.9* 5.3* 5.4* 5.6* 5.8*  ALBUMIN 1.2* 1.2* 1.2* 1.2* 1.2*   No results for input(s): LIPASE, AMYLASE in the last 168 hours. No results for input(s): AMMONIA in the last 168 hours. Coagulation Profile: No results for input(s): INR, PROTIME in the last 168 hours. Cardiac Enzymes: No results for input(s): CKTOTAL, CKMB, CKMBINDEX, TROPONINI in the last 168 hours. BNP (last 3 results) No results for input(s): PROBNP in the last 8760 hours. HbA1C: No results for input(s):  HGBA1C in the last 72 hours. CBG: Recent Labs  Lab 01/07/19 1956 01/08/19 0003 01/08/19 0527 01/08/19 0731 01/08/19 1101  GLUCAP 175* 156* 167* 190* 232*   Lipid Profile: No results for input(s): CHOL, HDL, LDLCALC, TRIG, CHOLHDL, LDLDIRECT in the last 72 hours. Thyroid Function Tests: No results for input(s): TSH, T4TOTAL, FREET4, T3FREE, THYROIDAB in the last 72 hours. Anemia Panel: No results for input(s): VITAMINB12, FOLATE, FERRITIN, TIBC, IRON, RETICCTPCT in the last 72 hours. Sepsis Labs: No results for input(s): PROCALCITON, LATICACIDVEN in the last 168 hours.  Recent Results (from the past 240 hour(s))  Aerobic/Anaerobic Culture (surgical/deep wound)     Status: None (Preliminary result)   Collection Time: 01/04/19  4:00 PM   Specimen: Abscess  Result Value Ref Range Status   Specimen Description ABSCESS PERITONEAL  Final   Special Requests Normal  Final   Gram Stain   Final    MODERATE WBC PRESENT, PREDOMINANTLY PMN RARE GRAM POSITIVE RODS    Culture   Final    RARE GRAM NEGATIVE RODS CULTURE REINCUBATED FOR BETTER GROWTH FEW BACTEROIDES FRAGILIS BETA LACTAMASE POSITIVE Performed at [facility], [address] [place], [place] [address]    Report Status PENDING  Incomplete      Radiology Studies: No results found.   Scheduled Meds: . acetaminophen  650 mg Oral Q6H  . chlorhexidine  15 mL Mouth Rinse BID  . Chlorhexidine Gluconate Cloth  6 each Topical Daily  . digoxin  0.125 mg Intravenous Daily  . feeding supplement (ENSURE ENLIVE)  237 mL Oral TID BM  . feeding supplement (PRO-STAT SUGAR FREE 64)  30 mL Oral TID BM  . insulin aspart  0-20 Units Subcutaneous Q4H  . mouth rinse  15 mL Mouth Rinse q12n4p  . methocarbamol  500 mg Oral TID  . pantoprazole (PROTONIX) IV  40 mg Intravenous Q24H  . sodium chloride flush  10-40 mL Intracatheter Q12H  . sodium chloride flush  5 mL Intracatheter Q8H   Continuous Infusions: . sodium chloride  Stopped (12/20/18 1319)  . sodium chloride    . amiodarone 30 mg/hr (01/08/19 1148)  . fluconazole (DIFLUCAN) IV 400 mg (01/08/19 1301)  . heparin 3,300 Units/hr (01/08/19 1305)  . piperacillin-tazobactam (ZOSYN)  IV 3.375 g (01/08/19 1310)  . TPN ADULT (ION) 40 mL/hr at 01/08/19 0600  . TPN ADULT (ION)       LOS: 19 days    Time spent: 25 minutes spent in the coordination of care today.    Teddy Spike, DO [facility]  If 7PM-7AM, please contact night-coverage [email or web address] 01/08/2019, 1:28 PM

## 2019-01-08 NOTE — Progress Notes (Addendum)
PHARMACY - TOTAL PARENTERAL NUTRITION CONSULT NOTE  Indication: massive bowel resection  Patient Measurements: Height: 5\' 7"  (170.2 cm) Weight: (!) 323 lb 6.6 oz (146.7 kg) IBW/kg (Calculated) : 66.1 TPN AdjBW (KG): 86.1 Body mass index is 50.65 kg/m.  Assessment:  54 yom presented to the hospital with abdominal pain found to be acute diverticulitis. He was treated conservatively but without improvement. He was taken to the OR 12/15 and found to have a perforated cecum with leakage of stool throughout the abdominal cavity. He remains on the ventilator with vasopressors. Patient has been NPO or clear liquids since admission so he may be at risk for refeeding syndrome.   Glucose / Insulin: A1c 6.3% - CBGs 149-214,  26 units SSI last 24 hrs + 10 units insulin in TPN Electrolytes: within normal limits  Renal: SCr up 1.45, BUN up 25 LFTs / TGs: LFTs / tbili / TG within normal limits Prealbumin / albumin: prealbumin 6.1, albumin 1.2 Intake / Output; MIVF: drain O/P 154mL, ileostomy O/P 663ml, UOP ~0.3 mL/kg/hr, not all recorded. Net + 1L. LBM 12/25.   GI Imaging: 12/9 Abd xray: Worsening gaseous distension of the proximal colon and now involving much of the small bowel 12/21 CT Abd: Large R-side intra-abdominal abscess, mesenteric edema and few mesenteric fluid collections; probable mild small-bowel ileus; small bilateral pleural effusions 12/14 Abd xray: Persistent bowel dilatation. No free air demonstrable 12/14 CT abd: Worsening distal large-bowel obstruction at the level of the descending colon correlating with an approximately 5 cm in length segment of irregular annular wall thickening with underlying colonic diverticulosis and mild pericolonic fat stranding 12/21 CT Abd: Large R-side intra-abdominal abscess, mesenteric edema and few mesenteric fluid collections; probable mild small-bowel ileus; small bilateral pleural effusions Surgeries / Procedures:  12/10 Flexible sigmoidoscopy 12/15  Exlap, subtotal colectomy and end ileostomy 12/22 IR drainage of right peritoneal abscess  Central access: PICC 12/28/18 TPN start date: 12/29/18  Nutritional Goals (per RD rec on 12/22): KCal: 2200-2400, Protein: 130-150, Fluid: >/=1.8 L/day  Current Nutrition:  TPN  Heart healthy/carb modified diet - low intake Ensure Enlive + Prostat  Plan:  Continue reduced TPN (per discussion with surgery) at 40 ml/hr, which will provide 74g AA, 150g CHO and 38g ILE for a total of 1188 kCal, meeting ~50% of patient needs Electrolytes in TPN: no change - monitor renal fxn/lytes in AM for possible need to adjust Add standard MVI MWF and trace elements daily to TPN Continue resistant SSI Q4H + increase to 15 units insulin in TPN  F/U PO intake and ability to stop TPN soon  Sloan Leiter, PharmD, BCPS, BCCCP Clinical Pharmacist Please refer to [facility] for [place] numbers 01/08/2019 9:01 AM

## 2019-01-08 NOTE — Progress Notes (Signed)
[place] for Heparin  Indication: hx VTE, LV mural thrombus, afib   Allergies  Allergen Reactions  . Shrimp [Shellfish Allergy] Hives and Itching    Patient Measurements: Height: 5\' 7"  (170.2 cm) Weight: (!) 323 lb 6.6 oz (146.7 kg) IBW/kg (Calculated) : 66.1 Heparin Dosing Weight:  101.6 kg  Vital Signs: Temp: 98.1 F (36.7 C) (12/26 0510) Temp Source: Oral (12/26 0510) BP: 99/58 (12/26 0510)  Labs: Recent Labs    01/06/19 0401 01/07/19 1432 01/07/19 2200 01/08/19 0505  HGB 9.1* 9.3*  --  8.8*  HCT 27.8* 29.2*  --  27.2*  PLT 227 301  --  287  HEPARINUNFRC 0.54 0.26* 0.40 0.64  CREATININE 1.35*  --   --  1.45*    Estimated Creatinine Clearance: 81.9 mL/min (A) (by C-G formula based on SCr of 1.45 mg/dL (H)).   Assessment: 53 y.o. male with h/o Afib and LV thrombus, Eliquis on hold, for IV heparin.  Heparin remains therapeutic, will continue current rate. Hgb slightly decreased, no issues with infusion or bleeding reported.  Goal of Therapy:  Heparin level 0.3-0.7 units/ml Monitor platelets by anticoagulation protocol: Yes   Plan:  Continue heparin to 3300 units/hr Daily heparin level and CBC Monitor for s/sx of bleeding F/u plans to resume PTA Eliquis   Thank you for allowing pharmacy to participate in this patient's care.  Joury Allcorn L. Devin Going, [place] PGY1 Pharmacy Resident [phone] 01/08/19      7:29 AM  Please check AMION for all [place] phone numbers After 10:00 PM, call the [place] [phone]

## 2019-01-08 NOTE — Progress Notes (Signed)
11 Days Post-Op   Subjective/Chief Complaint: Pt doing well. Tol PO and having bowel function through ostomy   Objective: Vital signs in last 24 hours: Temp:  [98.1 F (36.7 C)-99.3 F (37.4 C)] 99.1 F (37.3 C) (12/26 0734) Pulse Rate:  [72-75] 72 (12/26 0734) Resp:  [18-26] 22 (12/26 0510) BP: (92-99)/(50-59) 95/50 (12/26 0734) SpO2:  [90 %-96 %] 95 % (12/26 0734) Weight:  [146.7 kg] 146.7 kg (12/26 0510) Last BM Date: 01/07/19  Intake/Output from previous day: 12/25 0701 - 12/26 0700 In: 3776.6 [P.O.:480; I.V.:2709.1; IV Piggyback:587.5] Out: 1960 [Urine:1200; Drains:110; Stool:650] Intake/Output this shift: No intake/output data recorded.  PE: Gen: Alert, NAD, pleasant Card: Regular rate and rhythm Pulm: rate andeffortnormal NID:POEUM, soft,ostomy viable with liquid stool in pouch, drain in LLQ with SS fluid,drain on R side with some purulent material,midline wound dehisced, no bowel mushrooming out of wound but able to see some peristalsis, some necrotic tissue mostly at 9 o'clock position   Lab Results:  Recent Labs    01/07/19 1432 01/08/19 0505  WBC 23.4* 21.8*  HGB 9.3* 8.8*  HCT 29.2* 27.2*  PLT 301 287   BMET Recent Labs    01/06/19 0401 01/08/19 0505  NA 141 136  K 3.9 4.3  CL 102 98  CO2 28 29  GLUCOSE 147* 180*  BUN 22* 25*  CREATININE 1.35* 1.45*  CALCIUM 7.9* 7.1*   Anti-infectives: Anti-infectives (From admission, onward)   Start     Dose/Rate Route Frequency Ordered Stop   12/28/18 1400  fluconazole (DIFLUCAN) IVPB 400 mg     400 mg 100 mL/hr over 120 Minutes Intravenous Every 24 hours 12/28/18 1318     12/20/18 2200  piperacillin-tazobactam (ZOSYN) IVPB 3.375 g     3.375 g 12.5 mL/hr over 240 Minutes Intravenous Every 8 hours 12/20/18 1834     12/20/18 0645  piperacillin-tazobactam (ZOSYN) IVPB 3.375 g  Status:  Discontinued     3.375 g 100 mL/hr over 30 Minutes Intravenous Every 6 hours 12/20/18 0630 12/20/18 1833   12/19/18 2315  piperacillin-tazobactam (ZOSYN) IVPB 3.375 g     3.375 g 100 mL/hr over 30 Minutes Intravenous  Once 12/19/18 2302 12/20/18 0003      Assessment/Plan: CHF EF 25-30% Atrial fibrillation Hx DVT/PE LV mural thrombus Gross anticoagulant Morbid obesityBMI 50.63 AKI onCKD Dehydration R elbow pain - primary team ordering films  POD#11STC/ileostomy fordiverticular stricture of left colon and perforated R colon - pathwithout evidence of malignancy -s/p drain placement 12/22 - cxs pending, continue IV abx -ileostomy functioning, tolerating a diet although has not been taking in very much yet. Continue protein supplements - wound dehisced, monitor closely. BID wet to dry dressing changes - abdominal binder - bedrest until ~14 days postop to hopefully avoid further dehiscence/evisceration of wound  FEN:1/2 TPN, HH/CM diet, ensure, prostat PNT:[id number] gtt ID: IV Zosyn 12/6>>, IV diflucan 12/15>>   LOS: 19 days    Ralene Ok 01/08/2019

## 2019-01-09 LAB — RENAL FUNCTION PANEL
Albumin: 1.1 g/dL — ABNORMAL LOW (ref 3.5–5.0)
Anion gap: 11 (ref 5–15)
BUN: 23 mg/dL — ABNORMAL HIGH (ref 6–20)
CO2: 25 mmol/L (ref 22–32)
Calcium: 7.1 mg/dL — ABNORMAL LOW (ref 8.9–10.3)
Chloride: 96 mmol/L — ABNORMAL LOW (ref 98–111)
Creatinine, Ser: 1.23 mg/dL (ref 0.61–1.24)
GFR calc Af Amer: >60 mL/min (ref >60)
GFR calc non Af Amer: >60 mL/min (ref >60)
Glucose, Bld: 261 mg/dL — ABNORMAL HIGH (ref 70–99)
Phosphorus: 3.3 mg/dL (ref 2.5–4.6)
Potassium: 4.4 mmol/L (ref 3.5–5.1)
Sodium: 132 mmol/L — ABNORMAL LOW (ref 135–145)

## 2019-01-09 LAB — HEPARIN LEVEL (UNFRACTIONATED): Heparin Unfractionated: 0.61 IU/mL (ref 0.30–0.70)

## 2019-01-09 LAB — OCCULT BLOOD X 1 CARD TO LAB, STOOL: Fecal Occult Bld: POSITIVE — AB

## 2019-01-09 LAB — AEROBIC/ANAEROBIC CULTURE W GRAM STAIN (SURGICAL/DEEP WOUND): Special Requests: NORMAL

## 2019-01-09 LAB — MAGNESIUM: Magnesium: 2.2 mg/dL (ref 1.7–2.4)

## 2019-01-09 LAB — GLUCOSE, CAPILLARY
Glucose-Capillary: 154 mg/dL — ABNORMAL HIGH (ref 70–99)
Glucose-Capillary: 168 mg/dL — ABNORMAL HIGH (ref 70–99)
Glucose-Capillary: 183 mg/dL — ABNORMAL HIGH (ref 70–99)
Glucose-Capillary: 184 mg/dL — ABNORMAL HIGH (ref 70–99)
Glucose-Capillary: 189 mg/dL — ABNORMAL HIGH (ref 70–99)
Glucose-Capillary: 208 mg/dL — ABNORMAL HIGH (ref 70–99)

## 2019-01-09 LAB — CBC
HCT: 20.3 % — ABNORMAL LOW (ref 39.0–52.0)
Hemoglobin: 6.5 g/dL — CL (ref 13.0–17.0)
MCH: 28.9 pg (ref 26.0–34.0)
MCHC: 32 g/dL (ref 30.0–36.0)
MCV: 90.2 fL (ref 80.0–100.0)
Platelets: 318 10*3/uL (ref 150–400)
RBC: 2.25 MIL/uL — ABNORMAL LOW (ref 4.22–5.81)
RDW: 15.3 % (ref 11.5–15.5)
WBC: 19.4 10*3/uL — ABNORMAL HIGH (ref 4.0–10.5)
nRBC: 0.2 % (ref 0.0–0.2)

## 2019-01-09 LAB — HEMOGLOBIN AND HEMATOCRIT, BLOOD
HCT: 30.1 % — ABNORMAL LOW (ref 39.0–52.0)
Hemoglobin: 9.5 g/dL — ABNORMAL LOW (ref 13.0–17.0)

## 2019-01-09 LAB — PREPARE RBC (CROSSMATCH)

## 2019-01-09 MED ORDER — SODIUM CHLORIDE 0.9 % IV BOLUS
250.0000 mL | Freq: Once | INTRAVENOUS | Status: AC
  Administered 2019-01-09: 03:00:00 250 mL via INTRAVENOUS

## 2019-01-09 MED ORDER — SODIUM CHLORIDE 0.9% IV SOLUTION: Freq: Once | INTRAVENOUS | Status: AC

## 2019-01-09 MED ORDER — AMIODARONE HCL 200 MG PO TABS
200.0000 mg | ORAL_TABLET | Freq: Every day | ORAL | Status: DC
  Administered 2019-01-09 – 2019-01-24 (×16): 200 mg via ORAL
  Filled 2019-01-09 (×16): qty 1

## 2019-01-09 MED ORDER — SODIUM CHLORIDE 0.9 % IV BOLUS
250.0000 mL | Freq: Once | INTRAVENOUS | Status: AC
  Administered 2019-01-09: 250 mL via INTRAVENOUS

## 2019-01-09 MED ORDER — TRACE MINERALS CU-MN-SE-ZN 300-55-60-3000 MCG/ML IV SOLN
INTRAVENOUS | Status: AC
  Filled 2019-01-09: qty 492.8

## 2019-01-09 NOTE — Progress Notes (Signed)
PHARMACY - TOTAL PARENTERAL NUTRITION CONSULT NOTE  Indication: massive bowel resection  Patient Measurements: Height: 5\' 7"  (170.2 cm) Weight: (!) 326 lb 6.4 oz (148.1 kg) IBW/kg (Calculated) : 66.1 TPN AdjBW (KG): 86.1 Body mass index is 51.12 kg/m.  Assessment:  89 yom presented to the hospital with abdominal pain found to be acute diverticulitis. He was treated conservatively but without improvement. He was taken to the OR 12/15 and found to have a perforated cecum with leakage of stool throughout the abdominal cavity. He remains on the ventilator with vasopressors. Patient has been NPO or clear liquids since admission so he may be at risk for refeeding syndrome.   Glucose / Insulin: A1c 6.3% - CBGs 154-183,  27 units SSI last 24 hrs + 15 units insulin in TPN Electrolytes: Na down to 132 and Cl low at 96. Others within normal limits. -NS x1L overnight with low BP. Lasix held 12/25.  Renal: SCr down 1.23, BUN down 23 LFTs / TGs: LFTs / tbili / TG within normal limits Prealbumin / albumin: prealbumin 6.1, albumin 1.2 Intake / Output; MIVF: drain O/P 33mL, ileostomy O/P 137ml, stool 356mL, UOP ~1.1 mL/kg/hr. Net -2.1L. +1 generalized edema (+2 edema in lower extremities).  GI Imaging: 12/9 Abd xray: Worsening gaseous distension of the proximal colon and now involving much of the small bowel 12/21 CT Abd: Large R-side intra-abdominal abscess, mesenteric edema and few mesenteric fluid collections; probable mild small-bowel ileus; small bilateral pleural effusions 12/14 Abd xray: Persistent bowel dilatation. No free air demonstrable 12/14 CT abd: Worsening distal large-bowel obstruction at the level of the descending colon correlating with an approximately 5 cm in length segment of irregular annular wall thickening with underlying colonic diverticulosis and mild pericolonic fat stranding 12/21 CT Abd: Large R-side intra-abdominal abscess, mesenteric edema and few mesenteric fluid collections;  probable mild small-bowel ileus; small bilateral pleural effusions Surgeries / Procedures:  12/10 Flexible sigmoidoscopy 12/15 Exlap, subtotal colectomy and end ileostomy 12/22 IR drainage of right peritoneal abscess  Central access: PICC 12/28/18 TPN start date: 12/29/18  Nutritional Goals (per RD rec on 12/22): KCal: 2200-2400, Protein: 130-150, Fluid: >/=1.8 L/day  Current Nutrition:  TPN  Heart healthy/carb modified diet - low intake, poor appetite Ensure Enlive - 2 charted but not full amounts Prostat - 2 doses charted  Plan:  Continue reduced TPN (per discussion with surgery) at 40 ml/hr, which will provide 74g AA, 150g CHO and 38g ILE for a total of 1188 kCal, meeting ~50% of patient needs Electrolytes in TPN: no change - monitor renal fxn/lytes in AM for possible need to adjust Add standard MVI MWF and trace elements daily to TPN Continue resistant SSI Q4H + 15 units insulin in TPN  F/U PO intake, pre-albumin tomorrow, and ability to stop TPN soon  Sloan Leiter, PharmD, BCPS, BCCCP Clinical Pharmacist Please refer to [facility] for [place] numbers 01/09/2019 8:12 AM

## 2019-01-09 NOTE — Progress Notes (Signed)
[facility] Progress Note  12 Days Post-Op  Subjective: CC-  Hemoglobin 6.5 this AM from 8.8. heparin is being held. No external signs of bleeding from his abdominal wound or in stool.  From abdominal standpoint states that he is doing well. Pain is stable. Abdominal bind helps with pain. Denies n/v. Ostomy functioning. States that he ate nearly all of his dinner last night.  Objective: Vital signs in last 24 hours: Temp:  [97.7 F (36.5 C)-98.8 F (37.1 C)] 98.3 F (36.8 C) (12/27 1013) Pulse Rate:  [70-73] 72 (12/27 1013) Resp:  [15-32] 22 (12/27 1013) BP: (80-108)/(32-58) 93/55 (12/27 1013) SpO2:  [91 %-97 %] 96 % (12/27 1013) Weight:  [148.1 kg] 148.1 kg (12/27 0421) Last BM Date: 01/08/19  Intake/Output from previous day: 12/26 0701 - 12/27 0700 In: 2374.8 [I.V.:1414.1; IV Piggyback:960.7] Out: 3557 [Urine:3950; Drains:80; Stool:490] Intake/Output this shift: Total I/O In: 335 [I.V.:20; Blood:315] Out: -   PE: Gen: Alert, NAD, pleasant Card: Regular rate and rhythm Pulm:rate andeffortnormal DUK:GURKY, soft,ostomyviablewith liquid nonbloody stool in pouch, drain in LLQ with SS fluid,drain on R side with scant purulent fluid,midlinewound dehisced but no eviscerated, somenecrotic tissuemostly at 9 o'clock position.      Lab Results:  Recent Labs    01/08/19 0505 01/09/19 0500  WBC 21.8* 19.4*  HGB 8.8* 6.5*  HCT 27.2* 20.3*  PLT 287 318   BMET Recent Labs    01/08/19 0505 01/09/19 0500  NA 136 132*  K 4.3 4.4  CL 98 96*  CO2 29 25  GLUCOSE 180* 261*  BUN 25* 23*  CREATININE 1.45* 1.23  CALCIUM 7.1* 7.1*   PT/INR No results for input(s): LABPROT, INR in the last 72 hours. CMP     Component Value Date/Time   NA 132 (L) 01/09/2019 0500   NA 140 08/26/2018 1355   K 4.4 01/09/2019 0500   CL 96 (L) 01/09/2019 0500   CO2 25 01/09/2019 0500   GLUCOSE 261 (H) 01/09/2019 0500   BUN 23 (H) 01/09/2019 0500   BUN 14  08/26/2018 1355   CREATININE 1.23 01/09/2019 0500   CREATININE 1.20 10/29/2016 1236   CALCIUM 7.1 (L) 01/09/2019 0500   PROT 5.8 (L) 01/08/2019 0505   PROT 7.4 12/03/2018 1459   ALBUMIN 1.1 (L) 01/09/2019 0500   ALBUMIN 3.6 (L) 12/03/2018 1459   AST 21 01/08/2019 0505   ALT 15 01/08/2019 0505   ALKPHOS 44 01/08/2019 0505   BILITOT 0.2 (L) 01/08/2019 0505   BILITOT 0.3 12/03/2018 1459   GFRNONAA >60 01/09/2019 0500   GFRAA >60 01/09/2019 0500   Lipase     Component Value Date/Time   LIPASE 27 12/19/2018 2038       Studies/Results: No results found.  Anti-infectives: Anti-infectives (From admission, onward)   Start     Dose/Rate Route Frequency Ordered Stop   12/28/18 1400  fluconazole (DIFLUCAN) IVPB 400 mg     400 mg 100 mL/hr over 120 Minutes Intravenous Every 24 hours 12/28/18 1318     12/20/18 2200  piperacillin-tazobactam (ZOSYN) IVPB 3.375 g     3.375 g 12.5 mL/hr over 240 Minutes Intravenous Every 8 hours 12/20/18 1834     12/20/18 0645  piperacillin-tazobactam (ZOSYN) IVPB 3.375 g  Status:  Discontinued     3.375 g 100 mL/hr over 30 Minutes Intravenous Every 6 hours 12/20/18 0630 12/20/18 1833   12/19/18 2315  piperacillin-tazobactam (ZOSYN) IVPB 3.375 g     3.375 g 100 mL/hr over  30 Minutes Intravenous  Once 12/19/18 2302 12/20/18 0003       Assessment/Plan CHF EF 25-30% Atrial fibrillation Hx DVT/PE LV mural thrombus Gross anticoagulant Morbid obesityBMI 50.63 AKI onCKD Dehydration R elbow pain   POD#12STC/ileostomy fordiverticular stricture of left colon and perforated R colon - pathwithout evidence of malignancy -s/p drain placement 12/22 - cxs E coli pansensitive, continue IV abx. Plan repeat CT once output minimal -ileostomy functioning, tolerating a diet. Repeat prealbumin tomorrow, he is at 1/2 TPN and this may be able to be stopped soon if prealbumin is improving - wound dehisced, monitor closely. BID wet to dry dressing  changes - abdominal binder - bedrest until ~14 days postop to hopefully avoid further dehiscence/evisceration of wound  FEN:1/2 TPN,HH/CM diet, ensure, prostat MEQ:ASTMHDQ gtt held for ABL anemia ID: IV Zosyn 12/6>>, IV diflucan 12/15>>   LOS: 20 days    Franne Forts, [facility] 01/09/2019, 10:58 AM Please see Amion for pager number during day hours 7:00am-4:30pm

## 2019-01-09 NOTE — Progress Notes (Signed)
[facility] for Heparin  Indication: hx VTE, LV mural thrombus, afib   Allergies  Allergen Reactions  . Shrimp [Shellfish Allergy] Hives and Itching    Patient Measurements: Height: 5\' 7"  (170.2 cm) Weight: (!) 326 lb 6.4 oz (148.1 kg) IBW/kg (Calculated) : 66.1 Heparin Dosing Weight:  101.6 kg  Vital Signs: Temp: 98.4 F (36.9 C) (12/27 0421) Temp Source: Oral (12/27 0421) BP: 88/53 (12/27 0421) Pulse Rate: 70 (12/27 0421)  Labs: Recent Labs    01/07/19 1432 01/07/19 2200 01/08/19 0505 01/09/19 0500  HGB 9.3*  --  8.8* 6.5*  HCT 29.2*  --  27.2* 20.3*  PLT 301  --  287 318  HEPARINUNFRC 0.26* 0.40 0.64 0.61  CREATININE  --   --  1.45* 1.23    Estimated Creatinine Clearance: 97.2 mL/min (by C-G formula based on SCr of 1.23 mg/dL).   Assessment: 53 y.o. male with h/o Afib and LV thrombus, Eliquis on hold, for IV heparin.  Hg dropped this am to 6.5, no bleeding reported per RN.  Heparin level 0.61 units/ml  Goal of Therapy:  Heparin level 0.3-0.7 units/ml  Try to keep lower end with low Hg Monitor platelets by anticoagulation protocol: Yes   Plan:  Decrease heparin to 3100 units/hr Check heparin level 6 hours after rate change Daily heparin level and CBC Monitor for s/sx of bleeding F/u plans to resume PTA Eliquis   Thank you for allowing pharmacy to participate in this patient's care.  Excell Seltzer, PharmD

## 2019-01-09 NOTE — Progress Notes (Signed)
Roberto [name]  PROGRESS NOTE    HEMAN QUE  YOV:[id number] DOB: Oct 22, 1965 DOA: 12/19/2018 PCP: Denita Lung, MD   Brief Narrative:   Pt. With history of NICM, EF 25-30%, PSVT, PAF, chronic systolic HF with AICD, hx of DVT/PE/LV thrombus , on chronic anticoagulation ( bridged with heparin), was admitted 12/20/2018 for acute diverticulitis. He was initially treated conservatively, but had no real improvement and was taken to the OR 12/28/2018. Pt had Left colonic stricture of unknown etiology, and perforated cecum. Per surgery's note there was a mass in the mid descending colon. The right colon was ischemic and had perforated. There was visualization of leakage of a large amount of stool throughout his entire abdominal cavity. In OR patient became hypotensive, required pressors (Phenylephrine and Levo). Surgery felt the patient was not ready for extubation, and considering co morbidities and possible septic insult asked PCCM to admit to ICU manage care. Transferred to [facility] service on 01/03/2019 now off pressors.  01/09/19:Hgb is down today. Getting blood. Stop abx/diflucan today. Resume eliquis soon? Needs PT/OT, mobility    Assessment & Plan:   Principal Problem:   Acute diverticulitis Active Problems:   Obesity, morbid (HCC)   Nonischemic cardiomyopathy (HCC)   Paroxysmal SVT (supraventricular tachycardia) (HCC)   Chronic systolic heart failure (HCC)   Chronic pain of right knee   History of DVT (deep vein thrombosis)   PAF (paroxysmal atrial fibrillation) (HCC)   Pre-operative cardiovascular examination   Bowel obstruction (HCC)   Respiratory failure (HCC)   Acute on chronic systolic heart failure (HCC)  Septic shock secondary to perforated cecum and contamination of abdominal cavity with stool s/p subtotal colectomy and ileostomy left colon mass - now off pressors - Wound care and drain management per surgery; pushing diet?; rec 3x day dressing change - on Zosyn and  fluconazole - afebrile,s/p abscess drain, needs ab binder     - 01/08/19: tolerating ab binder; still on zosyn (duration?)     - 01/09/19: d/c zosyn/diflucan today; hopefully can get mobile with binder  Peritoneal abscess status post CT-guided drainage by interventional radiologist on 01/04/2019, Dr. Kathlene Cote. - Management per interventional radiology; appreciate assistance  Acute hypoxic respiratory failure possibly secondary to postop atelectasis - Normal oxygen supplementation at baseline - on RA; stable  Chronic combined systolic and diastolic CHF - Last 2D echo LVEF 25 to 30% - Status post AICD -watch daily wts; essentially fluid neutral for stay     - will entresto when stabilizes  Paroxysmal A. fib History of DVT/PE/LV thrombus - continue digoxin, amiodarone and heparin drip - Cardiology following; appreciate assistance     - eliquis when ok w/ surgery     - Hgb down today; he's getting blood, can resume eliquis when Hgb stabilizes  Morbid obesity - BMI 54 - Recommend weight loss outpatient once stable  Right elbow pain -resolved  DVT prophylaxis: heparin held, SCD Code Status: FULL   Disposition Plan: TBD  Consultants:   Cardiology  General Surgery  IR  Antimicrobials:  . Zosyn, diflucan   Subjective: "Thanks for stopping by"  Objective: Vitals:   01/09/19 1200 01/09/19 1255 01/09/19 1500 01/09/19 1543  BP: (!) 86/43 (!) 91/54 112/74 (!) 99/57  Pulse:  72  73  Resp: (!) 38 (!) 24 (!) 31 (!) 28  Temp:  97.9 F (36.6 C)  98.5 F (36.9 C)  TempSrc:  Oral  Oral  SpO2: 93% 92% 92% 92%  Weight:      Height:  Intake/Output Summary (Last 24 hours) at 01/09/2019 1618 Last data filed at 01/09/2019 1200 Gross per 24 hour  Intake 3907.02 ml  Output 4410 ml  Net -502.98 ml   Filed Weights   01/07/19 0427 01/08/19 0510 01/09/19 0421  Weight: (!) 145.4 kg (!) 146.7 kg (!) 148.1 kg     Examination:  General: 53 y.o. male resting in bed in NAD Eyes: PERRL, normal sclera ENMT: Nares patent w/o discharge, orophaynx clear, dentition normal, ears w/o discharge/lesions/ulcers Cardiovascular: RRR, +S1, S2, no m/g/r, equal pulses throughout Respiratory: CTABL, no w/r/r, normal WOB GI: BS+, NDNT, ab binder, ostomy MSK: No c/c; BLE chronic edema Neuro: A&O x 3, no focal deficits Psyc: Appropriate interaction and affect, calm/cooperative   Data Reviewed: I have personally reviewed following labs and imaging studies.  CBC: Recent Labs  Lab 01/03/19 0521 01/05/19 0250 01/06/19 0401 01/07/19 1432 01/08/19 0505 01/09/19 0500 01/09/19 1423  WBC 20.3* 17.9* 21.6* 23.4* 21.8* 19.4*  --   NEUTROABS 13.9*  --   --  18.9* 16.8*  --   --   HGB 9.5* 9.3* 9.1* 9.3* 8.8* 6.5* 9.5*  HCT 30.2* 29.6* 27.8* 29.2* 27.2* 20.3* 30.1*  MCV 93.2 90.5 88.8 89.6 89.2 90.2  --   PLT 156 231 227 301 287 318  --    Basic Metabolic Panel: Recent Labs  Lab 01/03/19 0521 01/04/19 0415 01/05/19 0250 01/06/19 0401 01/07/19 1432 01/08/19 0505 01/09/19 0500  NA 143 141 144 141  --  136 132*  K 4.1 4.2 3.9 3.9  --  4.3 4.4  CL 113* 109 112* 102  --  98 96*  CO2 22 22 25 28   --  29 25  GLUCOSE 211* 214* 185* 147*  --  180* 261*  BUN 17 19 21* 22*  --  25* 23*  CREATININE 1.23 1.30* 1.30* 1.35*  --  1.45* 1.23  CALCIUM 7.6* 7.7* 7.9* 7.9*  --  7.1* 7.1*  MG 2.1  --  1.9 2.0 2.2 2.3 2.2  PHOS 3.2  --  3.7 4.4  --   --  3.3   GFR: Estimated Creatinine Clearance: 97.2 mL/min (by C-G formula based on SCr of 1.23 mg/dL). Liver Function Tests: Recent Labs  Lab 01/03/19 0521 01/04/19 0415 01/05/19 0250 01/06/19 0401 01/08/19 0505 01/09/19 0500  AST 26 29 29 31 21   --   ALT 16 17 17 20 15   --   ALKPHOS 27* 39 38 49 44  --   BILITOT 0.5 1.0 1.0 0.6 0.2*  --   PROT 4.9* 5.3* 5.4* 5.6* 5.8*  --   ALBUMIN 1.2* 1.2* 1.2* 1.2* 1.2* 1.1*   No results for input(s): LIPASE, AMYLASE in  the last 168 hours. No results for input(s): AMMONIA in the last 168 hours. Coagulation Profile: No results for input(s): INR, PROTIME in the last 168 hours. Cardiac Enzymes: No results for input(s): CKTOTAL, CKMB, CKMBINDEX, TROPONINI in the last 168 hours. BNP (last 3 results) No results for input(s): PROBNP in the last 8760 hours. HbA1C: No results for input(s): HGBA1C in the last 72 hours. CBG: Recent Labs  Lab 01/08/19 2038 01/09/19 0053 01/09/19 0419 01/09/19 0824 01/09/19 1214  GLUCAP 192* 184* 154* 183* 208*   Lipid Profile: No results for input(s): CHOL, HDL, LDLCALC, TRIG, CHOLHDL, LDLDIRECT in the last 72 hours. Thyroid Function Tests: No results for input(s): TSH, T4TOTAL, FREET4, T3FREE, THYROIDAB in the last 72 hours. Anemia Panel: No results for input(s): VITAMINB12, FOLATE, FERRITIN,  TIBC, IRON, RETICCTPCT in the last 72 hours. Sepsis Labs: No results for input(s): PROCALCITON, LATICACIDVEN in the last 168 hours.  Recent Results (from the past 240 hour(s))  Aerobic/Anaerobic Culture (surgical/deep wound)     Status: None   Collection Time: 01/04/19  4:00 PM   Specimen: Abscess  Result Value Ref Range Status   Specimen Description ABSCESS PERITONEAL  Final   Special Requests Normal  Final   Gram Stain   Final    MODERATE WBC PRESENT, PREDOMINANTLY PMN RARE GRAM POSITIVE RODS    Culture   Final    RARE ESCHERICHIA COLI FEW BACTEROIDES FRAGILIS BETA LACTAMASE POSITIVE Performed at [facility], [address] [place], [place] [address]    Report Status 01/09/2019 FINAL  Final   Organism ID, Bacteria ESCHERICHIA COLI  Final      Susceptibility   Escherichia coli - MIC*    AMPICILLIN <=2 SENSITIVE Sensitive     CEFAZOLIN <=4 SENSITIVE Sensitive     CEFEPIME <=1 SENSITIVE Sensitive     CEFTAZIDIME <=1 SENSITIVE Sensitive     CEFTRIAXONE <=1 SENSITIVE Sensitive     CIPROFLOXACIN <=0.25 SENSITIVE Sensitive     GENTAMICIN <=1 SENSITIVE Sensitive      IMIPENEM <=0.25 SENSITIVE Sensitive     TRIMETH/SULFA <=20 SENSITIVE Sensitive     AMPICILLIN/SULBACTAM <=2 SENSITIVE Sensitive     PIP/TAZO <=4 SENSITIVE Sensitive     * RARE ESCHERICHIA COLI      Radiology Studies: No results found.   Scheduled Meds: . acetaminophen  650 mg Oral Q6H  . amiodarone  200 mg Oral Daily  . chlorhexidine  15 mL Mouth Rinse BID  . Chlorhexidine Gluconate Cloth  6 each Topical Daily  . digoxin  0.125 mg Intravenous Daily  . feeding supplement (ENSURE ENLIVE)  237 mL Oral TID BM  . feeding supplement (PRO-STAT SUGAR FREE 64)  30 mL Oral TID BM  . insulin aspart  0-20 Units Subcutaneous Q4H  . mouth rinse  15 mL Mouth Rinse q12n4p  . methocarbamol  500 mg Oral TID  . pantoprazole (PROTONIX) IV  40 mg Intravenous Q24H  . sodium chloride flush  10-40 mL Intracatheter Q12H  . sodium chloride flush  5 mL Intracatheter Q8H   Continuous Infusions: . sodium chloride 500 mL (01/09/19 1336)  . sodium chloride    . fluconazole (DIFLUCAN) IV 400 mg (01/09/19 1344)  . piperacillin-tazobactam (ZOSYN)  IV 3.375 g (01/09/19 1449)  . TPN ADULT (ION) 40 mL/hr at 01/09/19 1200  . TPN ADULT (ION)       LOS: 20 days    Time spent: 25 minutes spent in the coordination of care today   Teddy Spike, DO [facility]  If 7PM-7AM, please contact night-coverage [email or web address] 01/09/2019, 4:18 PM

## 2019-01-09 NOTE — Progress Notes (Signed)
CRITICAL VALUE ALERT  Critical Value:  Hgb 6.5  Date & Time Notied:  01/09/2019 7544  Provider Notified: Baltazar Najjar  Orders Received/Actions taken: 1 unit of blood to be transfused

## 2019-01-09 NOTE — Progress Notes (Signed)
Progress Note  Patient Name: Roberto [name] Date of Encounter: 01/09/2019  Primary Cardiologist: Arvilla Meres, MD   Subjective   No CP, no SOB, eating breakfast, no abd pain. HGB 6.5  Inpatient Medications    Scheduled Meds: . sodium chloride   Intravenous Once  . acetaminophen  650 mg Oral Q6H  . chlorhexidine  15 mL Mouth Rinse BID  . Chlorhexidine Gluconate Cloth  6 each Topical Daily  . digoxin  0.125 mg Intravenous Daily  . feeding supplement (ENSURE ENLIVE)  237 mL Oral TID BM  . feeding supplement (PRO-STAT SUGAR FREE 64)  30 mL Oral TID BM  . insulin aspart  0-20 Units Subcutaneous Q4H  . mouth rinse  15 mL Mouth Rinse q12n4p  . methocarbamol  500 mg Oral TID  . pantoprazole (PROTONIX) IV  40 mg Intravenous Q24H  . sodium chloride flush  10-40 mL Intracatheter Q12H  . sodium chloride flush  5 mL Intracatheter Q8H   Continuous Infusions: . sodium chloride Stopped (12/20/18 1319)  . sodium chloride    . amiodarone 30 mg/hr (01/08/19 2246)  . fluconazole (DIFLUCAN) IV 400 mg (01/08/19 1301)  . heparin 3,100 Units/hr (01/09/19 0547)  . piperacillin-tazobactam (ZOSYN)  IV 3.375 g (01/09/19 0634)  . TPN ADULT (ION) 40 mL/hr at 01/08/19 1741   PRN Meds: sodium chloride, alum & mag hydroxide-simeth, morphine injection, ondansetron (ZOFRAN) IV, oxyCODONE, sodium chloride flush   Vital Signs    Vitals:   01/09/19 0421 01/09/19 0606 01/09/19 0828 01/09/19 0906  BP: (!) 88/53 (!) 92/58 (!) 81/32 (!) 93/55  Pulse: 70  72   Resp: (!) 25  20 (!) 21  Temp: 98.4 F (36.9 C)  98.5 F (36.9 C)   TempSrc: Oral  Oral   SpO2: 97%  95% 95%  Weight: (!) 148.1 kg     Height:        Intake/Output Summary (Last 24 hours) at 01/09/2019 0935 Last data filed at 01/09/2019 0700 Gross per 24 hour  Intake 2374.81 ml  Output 4520 ml  Net -2145.19 ml   Filed Weights   01/07/19 0427 01/08/19 0510 01/09/19 0421  Weight: (!) 145.4 kg (!) 146.7 kg (!) 148.1 kg     Telemetry    Normal sinus rhythm- Personally Reviewed   Physical Exam   GEN: Well nourished, well developed, in no acute distress obese, comfortable in bed HEENT: normal  Neck: no JVD, carotid bruits, or masses Cardiac: RRR; no murmurs, rubs, or gallops,no edema  Respiratory:  clear to auscultation bilaterally, normal work of breathing GI: soft, nontender, nondistended, binder in place, drains MS: no deformity or atrophy  Skin: warm and dry, no rash Neuro:  Alert and Oriented x 3, Strength and sensation are intact Psych: euthymic mood, full affect    Labs    Chemistry Recent Labs  Lab 01/05/19 0250 01/06/19 0401 01/08/19 0505 01/09/19 0500  NA 144 141 136 132*  K 3.9 3.9 4.3 4.4  CL 112* 102 98 96*  CO2 25 28 29 25   GLUCOSE 185* 147* 180* 261*  BUN 21* 22* 25* 23*  CREATININE 1.30* 1.35* 1.45* 1.23  CALCIUM 7.9* 7.9* 7.1* 7.1*  PROT 5.4* 5.6* 5.8*  --   ALBUMIN 1.2* 1.2* 1.2* 1.1*  AST 29 31 21   --   ALT 17 20 15   --   ALKPHOS 38 49 44  --   BILITOT 1.0 0.6 0.2*  --   GFRNONAA >60 60* 55* >60  GFRAA >60 >60 >60 >60  ANIONGAP 7 11 9 11      Hematology Recent Labs  Lab 01/07/19 1432 01/08/19 0505 01/09/19 0500  WBC 23.4* 21.8* 19.4*  RBC 3.26* 3.05* 2.25*  HGB 9.3* 8.8* 6.5*  HCT 29.2* 27.2* 20.3*  MCV 89.6 89.2 90.2  MCH 28.5 28.9 28.9  MCHC 31.8 32.4 32.0  RDW 15.5 15.4 15.3  PLT 301 287 318    Cardiac EnzymesNo results for input(s): TROPONINI in the last 168 hours. No results for input(s): TROPIPOC in the last 168 hours.   BNPNo results for input(s): BNP, PROBNP in the last 168 hours.   DDimer No results for input(s): DDIMER in the last 168 hours.   Radiology    No results found.  Cardiac Studies   Echocardiogram 07/20/2018 IMPRESSIONS  1. The left ventricle has severely reduced systolic function, with an ejection fraction of 25-30%. The cavity size was moderately dilated. Left ventricular diastolic Doppler parameters are  consistent with pseudonormalization. Left ventricular diffuse  hypokinesis. 2. The right ventricle has normal systolic function. The cavity was normal. There is no increase in right ventricular wall thickness. 3. Left atrial size was mildly dilated. 4. No evidence of mitral valve stenosis. Trivial mitral regurgitation. 5. The aortic valve is tricuspid. No stenosis of the aortic valve. 6. The aortic root is normal in size and structure. 7. The inferior vena cava was dilated in size with <50% respiratory variability. No complete TR doppler jet so unable to estimate PA systolic pressure.  Patient Profile     53 y.o. male with a hx of chronic diastolic HF,h/o ofLV thrombus, nonischemic cardiomyopathy (EF 25-30% 2013, EF 2020 25-30%), ICD placed 08/2018, PSVT (elected for medical therapy over RFA), DVT/PE, Paroxysmal afib on amiodarone, Aflutter ablation 11/2016, lupus, and morbid obesityinitially seen for preop evaluation in setting of abdominal pain/acute diverticulitis. Followed for hx of CHF.  Assessment & Plan    Acute diverticulitis/Colon Mass/ Sepsis Patient was admitted 12/7 for acute diverticulitis and was being treated medically but did not improve. 12/15 he was taken to the OR found to have perforated cecum and surgery found a mass. In the OR patient became hypotensive and required pressors. Patient was intubated and transferred to ICU for sepsis, place on IV abx. Patient was extubated 12/16. - Path from colon mass shows benign colonic mucosa with mild reactive changes, no active inflammation, no malignancy -off TPN - Off pressors, relatively soft blood pressures -Surgery note reviewed -s/p drainageof large abcess   Chronic systolic HF (LVEF 25-30%) - Followed by Advanced Heart Failure team - At baseline was on Entresto, spironolactone, and Torsemide. All held during admission - Digoxin was continued. Level0.6 on 12/25.  - Patient was started on IV Lasix 40 mg BID  which was subsequently held on 01/07/2019 -Creatinine did increase today to 1.45 up from 1.35 but is now down to 1.23. - Weight down to 326 lbs (333 on admit) - His weight at last AHF clinic a year ago was 301lbs and with EP last month was 333lbs (felt to be euvolemic at that time) - Patient has 1+ edema on exam, some of which appears chronic.  - follow I&Os and daily weights - I think we should hold off on restarting Entrestobecause of soft BP.   Morbid obesity with BMI 54.  ICD in place -Functioning well.  Paroxysmal Afib - Had Aflutter ablation 2018 - On amiodarone at home, currently on amiodarone IV which I will stop on 01/09/2019.  Remember, patient is n.p.o. - Eliquis for stroke prevention>>was held for surgery and placed on heparin.  - change Heparin back to Eliquis when OK the surgery.  Prior history of DVT PE LV thrombus - hold heparin with Hg 6.5 this AM. Work up per main team who is aware. Per nurse, 1 unit of blood ordered - no new symptoms. ?need for CT   For questions or updates, please contact [place] Please consult [email or web address] for contact info under Cardiology/STEMI.      Signed, Candee Furbish, MD  01/09/2019, 9:35 AM

## 2019-01-10 LAB — GLUCOSE, CAPILLARY
Glucose-Capillary: 140 mg/dL — ABNORMAL HIGH (ref 70–99)
Glucose-Capillary: 152 mg/dL — ABNORMAL HIGH (ref 70–99)
Glucose-Capillary: 159 mg/dL — ABNORMAL HIGH (ref 70–99)
Glucose-Capillary: 160 mg/dL — ABNORMAL HIGH (ref 70–99)
Glucose-Capillary: 165 mg/dL — ABNORMAL HIGH (ref 70–99)
Glucose-Capillary: 176 mg/dL — ABNORMAL HIGH (ref 70–99)
Glucose-Capillary: 194 mg/dL — ABNORMAL HIGH (ref 70–99)

## 2019-01-10 LAB — RENAL FUNCTION PANEL
Albumin: 1.2 g/dL — ABNORMAL LOW (ref 3.5–5.0)
Anion gap: 10 (ref 5–15)
BUN: 20 mg/dL (ref 6–20)
CO2: 25 mmol/L (ref 22–32)
Calcium: 7.6 mg/dL — ABNORMAL LOW (ref 8.9–10.3)
Chloride: 99 mmol/L (ref 98–111)
Creatinine, Ser: 1.34 mg/dL — ABNORMAL HIGH (ref 0.61–1.24)
GFR calc Af Amer: >60 mL/min (ref >60)
GFR calc non Af Amer: >60 mL/min (ref >60)
Glucose, Bld: 196 mg/dL — ABNORMAL HIGH (ref 70–99)
Phosphorus: 3.4 mg/dL (ref 2.5–4.6)
Potassium: 5 mmol/L (ref 3.5–5.1)
Sodium: 134 mmol/L — ABNORMAL LOW (ref 135–145)

## 2019-01-10 LAB — CBC WITH DIFFERENTIAL/PLATELET
Abs Immature Granulocytes: 0.8 10*3/uL — ABNORMAL HIGH (ref 0.00–0.07)
Basophils Absolute: 0 10*3/uL (ref 0.0–0.1)
Basophils Relative: 0 %
Eosinophils Absolute: 0.2 10*3/uL (ref 0.0–0.5)
Eosinophils Relative: 1 %
HCT: 30 % — ABNORMAL LOW (ref 39.0–52.0)
Hemoglobin: 9.5 g/dL — ABNORMAL LOW (ref 13.0–17.0)
Lymphocytes Relative: 7 %
Lymphs Abs: 1.1 10*3/uL (ref 0.7–4.0)
MCH: 28.4 pg (ref 26.0–34.0)
MCHC: 31.7 g/dL (ref 30.0–36.0)
MCV: 89.8 fL (ref 80.0–100.0)
Metamyelocytes Relative: 3 %
Monocytes Absolute: 0.6 10*3/uL (ref 0.1–1.0)
Monocytes Relative: 4 %
Myelocytes: 2 %
Neutro Abs: 13 10*3/uL — ABNORMAL HIGH (ref 1.7–7.7)
Neutrophils Relative %: 83 %
Platelets: 276 10*3/uL (ref 150–400)
RBC: 3.34 MIL/uL — ABNORMAL LOW (ref 4.22–5.81)
RDW: 15.5 % (ref 11.5–15.5)
WBC: 15.7 10*3/uL — ABNORMAL HIGH (ref 4.0–10.5)
nRBC: 0.5 % — ABNORMAL HIGH (ref 0.0–0.2)
nRBC: 2 /100 WBC — ABNORMAL HIGH

## 2019-01-10 LAB — TYPE AND SCREEN
ABO/RH(D): AB POS
Antibody Screen: NEGATIVE
Unit division: 0

## 2019-01-10 LAB — CBC
HCT: 30.8 % — ABNORMAL LOW (ref 39.0–52.0)
Hemoglobin: 9.9 g/dL — ABNORMAL LOW (ref 13.0–17.0)
MCH: 28.9 pg (ref 26.0–34.0)
MCHC: 32.1 g/dL (ref 30.0–36.0)
MCV: 89.8 fL (ref 80.0–100.0)
Platelets: 269 10*3/uL (ref 150–400)
RBC: 3.43 MIL/uL — ABNORMAL LOW (ref 4.22–5.81)
RDW: 15.6 % — ABNORMAL HIGH (ref 11.5–15.5)
WBC: 16.6 10*3/uL — ABNORMAL HIGH (ref 4.0–10.5)
nRBC: 0.4 % — ABNORMAL HIGH (ref 0.0–0.2)

## 2019-01-10 LAB — BPAM RBC
Blood Product Expiration Date: 202101132359
ISSUE DATE / TIME: 202012270954
Unit Type and Rh: 8400

## 2019-01-10 LAB — PREALBUMIN: Prealbumin: 12.5 mg/dL — ABNORMAL LOW (ref 18–38)

## 2019-01-10 LAB — MAGNESIUM: Magnesium: 2.3 mg/dL (ref 1.7–2.4)

## 2019-01-10 LAB — TRIGLYCERIDES: Triglycerides: 110 mg/dL (ref <150)

## 2019-01-10 MED ORDER — TRACE MINERALS CU-MN-SE-ZN 300-55-60-3000 MCG/ML IV SOLN
INTRAVENOUS | Status: AC
  Filled 2019-01-10: qty 492.8

## 2019-01-10 MED ORDER — APIXABAN 5 MG PO TABS
5.0000 mg | ORAL_TABLET | Freq: Two times a day (BID) | ORAL | Status: DC
  Administered 2019-01-10 – 2019-01-24 (×28): 5 mg via ORAL
  Filled 2019-01-10 (×28): qty 1

## 2019-01-10 NOTE — Consult Note (Addendum)
[place] Nurse ostomy follow up Surgical team following for assessment and plan of care for abd wound.   Stoma type/location:RLQ ileostomy; current pouch was leaking behind the barrier. Stomal assessment/size:1 3/4 inches and oval, red, moist, flush with skin level, some mucocutaneous seperation from 6:00 o'clock to 8:00 o'clock, located very low on the abd. Mod amt liquid brown stool Peristomal assessment:intact Treatment options for stomal/peristomal skin:Barrier ring applied to attempt to maintain a seal.  Used one piece flexible convex pouch.  Pt watched using a hand held mirror and asked appropriate questions.  Reviewed pouching routines and ordering supplies.  Extra supplies left at the bedside for staff nurse use. [place] team will continue to follow for further teaching sessions. Pt plans to discharge to rehab setting, according to progress notes. Enrolled in Secure Start Discharge program: Yes, previously.  Julien Girt MSN, RN, [place], [place], [place]

## 2019-01-10 NOTE — Progress Notes (Signed)
PHARMACY - TOTAL PARENTERAL NUTRITION CONSULT NOTE  Indication: massive bowel resection  Patient Measurements: Height: 5\' 7"  (170.2 cm) Weight: (!) 324 lb 15.3 oz (147.4 kg) IBW/kg (Calculated) : 66.1 TPN AdjBW (KG): 86.1 Body mass index is 50.9 kg/m.  Assessment:  56 yom presented to the hospital with abdominal pain found to be acute diverticulitis. He was treated conservatively but without improvement. He was taken to the OR 12/15 and found to have a perforated cecum with leakage of stool throughout the abdominal cavity. He remains on the ventilator with vasopressors. Patient has been NPO or clear liquids since admission so he may be at risk for refeeding syndrome.   Glucose / Insulin: A1c 6.3% - CBGs improving.  Required 26 units SSI last 24 hrs + 15 units insulin in TPN Electrolytes: Na 134, K 5 (off Lasix), others WNL Renal: SCr up 1.34, BUN down 23 LFTs / TGs: LFTs / tbili / TG WNL Prealbumin / albumin: prealbumin improved to 12.5, albumin 1.2 Intake / Output; MIVF: drain O/P 127mL, ileostomy O/P 760ml, UOP 1.3 mL/kg/hr GI Imaging: 12/9 Abd xray: Worsening gaseous distension of the proximal colon and now involving much of the small bowel 12/21 CT Abd: Large R-side intra-abdominal abscess, mesenteric edema and few mesenteric fluid collections; probable mild small-bowel ileus; small bilateral pleural effusions 12/14 Abd xray: Persistent bowel dilatation. No free air demonstrable 12/14 CT abd: Worsening distal large-bowel obstruction at the level of the descending colon correlating with an approximately 5 cm in length segment of irregular annular wall thickening with underlying colonic diverticulosis and mild pericolonic fat stranding 12/21 CT Abd: Large R-side intra-abdominal abscess, mesenteric edema and few mesenteric fluid collections; probable mild small-bowel ileus; small bilateral pleural effusions Surgeries / Procedures:  12/10 Flexible sigmoidoscopy 12/15 Exlap, subtotal  colectomy and end ileostomy 12/22 IR drainage of right peritoneal abscess  Central access: PICC 12/28/18 TPN start date: 12/29/18  Nutritional Goals (per RD rec on 12/22): KCal: 2200-2400, Protein: 130-150, Fluid: >/=1.8 L/day  Current Nutrition:  TPN  Heart healthy/carb modified diet - consume up to 75% of meals Ensure Enlive TID - 2 charted given Prostat TID - 2 charted given  Calorie count started 12/28  Plan:  Continue 1/2 TPN per Surgery while RD doing calorie count TPN at 40 ml/hr provides 74g AA, 150g CHO and 38g ILE for a total of 1188 kCal, meeting ~50% of patient needs Electrolytes in TPN: increase Na, reduce K and Mag slightly, max acetate Add standard MVI MWF and trace elements daily to TPN Continue resistant SSI Q4H + increase regular insulin in TPN slightly to 20 units  F/U PO intake/calorie count to stop TPN, repeat BMET/Mag on 12/30   Rhodie Cienfuegos D. Mina Marble, PharmD, BCPS, [place] 01/10/2019, 10:58 AM

## 2019-01-10 NOTE — Progress Notes (Signed)
Progress Note  Patient Name: Roberto [name] Date of Encounter: 01/10/2019  Primary Cardiologist: Glori Bickers, MD   Subjective   No CP, no SOB. Hgb 9.5 today, feels no different than yesterday when more anemic.   Inpatient Medications    Scheduled Meds: . acetaminophen  650 mg Oral Q6H  . amiodarone  200 mg Oral Daily  . apixaban  5 mg Oral BID  . chlorhexidine  15 mL Mouth Rinse BID  . Chlorhexidine Gluconate Cloth  6 each Topical Daily  . digoxin  0.125 mg Intravenous Daily  . feeding supplement (ENSURE ENLIVE)  237 mL Oral TID BM  . feeding supplement (PRO-STAT SUGAR FREE 64)  30 mL Oral TID BM  . insulin aspart  0-20 Units Subcutaneous Q4H  . mouth rinse  15 mL Mouth Rinse q12n4p  . methocarbamol  500 mg Oral TID  . pantoprazole (PROTONIX) IV  40 mg Intravenous Q24H  . sodium chloride flush  10-40 mL Intracatheter Q12H  . sodium chloride flush  5 mL Intracatheter Q8H   Continuous Infusions: . sodium chloride 10 mL/hr at 01/09/19 1800  . sodium chloride    . TPN ADULT (ION) 40 mL/hr at 01/09/19 1800  . TPN ADULT (ION)     PRN Meds: sodium chloride, alum & mag hydroxide-simeth, morphine injection, ondansetron (ZOFRAN) IV, oxyCODONE, sodium chloride flush   Vital Signs    Vitals:   01/10/19 0127 01/10/19 0411 01/10/19 0809 01/10/19 1226  BP:  101/67 (!) 89/39 (!) 105/52  Pulse: 78 76    Resp: (!) 21 (!) 28    Temp:  98.3 F (36.8 C) 98.4 F (36.9 C) 98.7 F (37.1 C)  TempSrc:  Oral Oral Oral  SpO2: 95% 95%    Weight:  (!) 147.4 kg    Height:        Intake/Output Summary (Last 24 hours) at 01/10/2019 1454 Last data filed at 01/10/2019 1000 Gross per 24 hour  Intake 1636.74 ml  Output 2905 ml  Net -1268.26 ml   Filed Weights   01/08/19 0510 01/09/19 0421 01/10/19 0411  Weight: (!) 146.7 kg (!) 148.1 kg (!) 147.4 kg    Telemetry    Normal sinus rhythm- Personally Reviewed   Physical Exam   Constitutional: No acute distress Eyes: sclera  non-icteric, normal conjunctiva and lids ENMT: normal dentition, moist mucous membranes Cardiovascular: regular rhythm, normal rate, no murmurs. S1 and S2 normal.  Respiratory: clear to auscultation bilaterally anteriorly GI : binder in place, soft, no significant tenderness. MSK: extremities warm, well perfused. No edema.  NEURO: grossly nonfocal exam, moves all extremities. PSYCH: alert and oriented x 3, normal mood and affect.    Labs    Chemistry Recent Labs  Lab 01/05/19 0250 01/06/19 0401 01/08/19 0505 01/09/19 0500 01/10/19 0442  NA 144 141 136 132* 134*  K 3.9 3.9 4.3 4.4 5.0  CL 112* 102 98 96* 99  CO2 25 28 29 25 25   GLUCOSE 185* 147* 180* 261* 196*  BUN 21* 22* 25* 23* 20  CREATININE 1.30* 1.35* 1.45* 1.23 1.34*  CALCIUM 7.9* 7.9* 7.1* 7.1* 7.6*  PROT 5.4* 5.6* 5.8*  --   --   ALBUMIN 1.2* 1.2* 1.2* 1.1* 1.2*  AST 29 31 21   --   --   ALT 17 20 15   --   --   ALKPHOS 38 49 44  --   --   BILITOT 1.0 0.6 0.2*  --   --  GFRNONAA >60 60* 55* >60 >60  GFRAA >60 >60 >60 >60 >60  ANIONGAP 7 11 9 11 10      Hematology Recent Labs  Lab 01/09/19 0500 01/09/19 1423 01/10/19 0441 01/10/19 1313  WBC 19.4*  --  15.7* 16.6*  RBC 2.25*  --  3.34* 3.43*  HGB 6.5* 9.5* 9.5* 9.9*  HCT 20.3* 30.1* 30.0* 30.8*  MCV 90.2  --  89.8 89.8  MCH 28.9  --  28.4 28.9  MCHC 32.0  --  31.7 32.1  RDW 15.3  --  15.5 15.6*  PLT 318  --  276 269    Cardiac EnzymesNo results for input(s): TROPONINI in the last 168 hours. No results for input(s): TROPIPOC in the last 168 hours.   BNPNo results for input(s): BNP, PROBNP in the last 168 hours.   DDimer No results for input(s): DDIMER in the last 168 hours.   Radiology    No results found.  Cardiac Studies   Echocardiogram 07/20/2018 IMPRESSIONS  1. The left ventricle has severely reduced systolic function, with an ejection fraction of 25-30%. The cavity size was moderately dilated. Left ventricular diastolic Doppler  parameters are consistent with pseudonormalization. Left ventricular diffuse  hypokinesis. 2. The right ventricle has normal systolic function. The cavity was normal. There is no increase in right ventricular wall thickness. 3. Left atrial size was mildly dilated. 4. No evidence of mitral valve stenosis. Trivial mitral regurgitation. 5. The aortic valve is tricuspid. No stenosis of the aortic valve. 6. The aortic root is normal in size and structure. 7. The inferior vena cava was dilated in size with <50% respiratory variability. No complete TR doppler jet so unable to estimate PA systolic pressure.  Patient Profile     53 y.o. male with a hx of chronic diastolic HF,h/o ofLV thrombus, nonischemic cardiomyopathy (EF 25-30% 2013, EF 2020 25-30%), ICD placed 08/2018, PSVT (elected for medical therapy over RFA), DVT/PE, Paroxysmal afib on amiodarone, Aflutter ablation 11/2016, lupus, and morbid obesityinitially seen for preop evaluation in setting of abdominal pain/acute diverticulitis. Followed for hx of CHF.  Assessment & Plan    Acute diverticulitis/Colon Mass/ Sepsis Patient was admitted 12/7 for acute diverticulitis and was being treated medically but did not improve. 12/15 he was taken to the OR found to have perforated cecum and surgery found a mass. In the OR patient became hypotensive and required pressors. Patient was intubated and transferred to ICU for sepsis, place on IV abx. Patient was extubated 12/16. - Path from colon mass shows benign colonic mucosa with mild reactive changes, no active inflammation, no malignancy -off TPN - Off pressors, relatively soft blood pressures -Surgery note reviewed -s/p drainageof large abcess   Chronic systolic HF (LVEF 25-30%) - Followed by Advanced Heart Failure team - At baseline was on Entresto, spironolactone, and Torsemide. All held during admission - Digoxin was continued. Level0.6 on 12/25.  - Patient was started on IV  Lasix 40 mg BID which was subsequently held on 01/07/2019 -Creatinine 1.34 - Weight down to 326 lbs (333 on admit)  - His weight at last AHF clinic a year ago was 301lbs and with EP last month was 333lbs (felt to be euvolemic at that time) - Patient has 1+ edema on exam, some of which appears chronic.  - follow I&Os and daily weights - continue to hold off on restarting Entrestobecause of low BP.   Morbid obesity with BMI 54.  ICD in place  -Functioning well.  Paroxysmal Afib -  Had Aflutter ablation 2018 - On amiodarone at home, was on IV amio, now on amiodarone 200 mg daily - Eliquis for stroke prevention>>was held for surgery and placed on heparin.  - change Heparin back to Eliquis when OK the surgery.  Prior history of DVT PE LV thrombus - anemia yesterday s/p transfusion, hgb stable today - Eliquis planning to be cautiously restarted with ok from surgery if hemoglobin stable. Monitor H+H and signs of bleeding.   For questions or updates, please contact [facility] Please consult [email or web address] for contact info under Cardiology/STEMI.      Signed, Parke Poisson, MD  01/10/2019, 2:54 PM

## 2019-01-10 NOTE — Progress Notes (Signed)
Referring Physician(s): Meuth, Blaine Hamper (CCS)  Supervising Physician: Arne Cleveland  Patient Status:  [facility] - In-pt  Chief Complaint: "Abdominal pain"  Subjective:  History of colonic perforation s/p exploratory laporatomy with total colectomy and ileostomy in OR 12/28/2018 by Dr. Donne Hazel, with subsequent development of right lateral peritoneal fluid collection s/p right lateral abdominal drain placement in IR 01/04/2019 by Dr. Kathlene Cote. Patient awake and alert laying in bed talking on cell phone. Complains of abdominal pain. Right lateral abdominal drain site c/d/i.   Allergies: Shrimp [shellfish allergy]  Medications: Prior to Admission medications   Medication Sig Start Date End Date Taking? Authorizing Provider  acetaminophen (TYLENOL) 650 MG CR tablet Take 1,300 mg by mouth every 8 (eight) hours as needed for pain.   Yes [provider]  amiodarone (PACERONE) 200 MG tablet Take 1 tablet (200 mg total) by mouth daily. 10/18/18  Yes Bensimhon, Shaune Pascal, MD  coconut oil OIL Apply 1 application topically as needed (dry skin).    Yes [provider]  digoxin (LANOXIN) 0.125 MG tablet TAKE 1 TABLET(0.125 MG) BY MOUTH DAILY Patient taking differently: Take 0.125 mg by mouth daily.  11/01/18  Yes Bensimhon, Shaune Pascal, MD  ELIQUIS 5 MG TABS tablet TAKE 1 TABLET(5 MG) BY MOUTH TWICE DAILY Patient taking differently: Take 5 mg by mouth 2 (two) times daily.  03/22/18  Yes Bensimhon, Shaune Pascal, MD  ENTRESTO 49-51 MG TAKE 1 TABLET BY MOUTH TWICE DAILY Patient taking differently: Take 1 tablet by mouth 2 (two) times daily.  09/15/18  Yes Bensimhon, Shaune Pascal, MD  potassium chloride SA (KLOR-CON) 20 MEQ tablet Take 2 tablets (40 mEq total) by mouth 2 (two) times daily. 10/18/18  Yes Bensimhon, Shaune Pascal, MD  spironolactone (ALDACTONE) 25 MG tablet TAKE 1 TABLET(25 MG) BY MOUTH DAILY Patient taking differently: Take 25 mg by mouth daily.  12/03/18  Yes Denita Lung, MD    torsemide (DEMADEX) 20 MG tablet TAKE 2 TABLETS BY MOUTH TWICE DAILY. STOP LASIX Patient taking differently: Take 40 mg by mouth 2 (two) times daily.  06/29/18  Yes Bensimhon, Shaune Pascal, MD     Vital Signs: BP (!) 89/39 (BP Location: Left Arm)   Pulse 76   Temp 98.4 F (36.9 C) (Oral)   Resp (!) 28   Ht 5\' 7"  (1.702 m)   Wt (!) 324 lb 15.3 oz (147.4 kg)   SpO2 95%   BMI 50.90 kg/m   Physical Exam Vitals and nursing note reviewed.  Constitutional:      General: He is not in acute distress.    Appearance: He is obese.  Pulmonary:     Effort: Pulmonary effort is normal. No respiratory distress.  Abdominal:     Comments: (+) Abdominal binder. (+) Left surgical drain. Right lateral abdominal drain site without tenderness, erythema, drainage, or active bleeding; approximately 25 cc of thick tan fluid in suction bulb; drain flushes/aspirates without resistance.  Skin:    General: Skin is warm and dry.  Neurological:     Mental Status: He is alert and oriented to person, place, and time.  Psychiatric:        Mood and Affect: Mood normal.        Behavior: Behavior normal.     Imaging: No results found.  Labs:  CBC: Recent Labs    01/07/19 1432 01/08/19 0505 01/09/19 0500 01/09/19 1423 01/10/19 0441  WBC 23.4* 21.8* 19.4*  --  15.7*  HGB 9.3* 8.8*  6.5* 9.5* 9.5*  HCT 29.2* 27.2* 20.3* 30.1* 30.0*  PLT 301 287 318  --  276    COAGS: Recent Labs    12/28/18 0447 12/28/18 1410 12/29/18 0500 12/30/18 0447 12/30/18 1639  INR  --  1.5*  --   --   --   APTT 106*  --  29 32 78*    BMP: Recent Labs    01/06/19 0401 01/08/19 0505 01/09/19 0500 01/10/19 0442  NA 141 136 132* 134*  K 3.9 4.3 4.4 5.0  CL 102 98 96* 99  CO2 28 29 25 25   GLUCOSE 147* 180* 261* 196*  BUN 22* 25* 23* 20  CALCIUM 7.9* 7.1* 7.1* 7.6*  CREATININE 1.35* 1.45* 1.23 1.34*  GFRNONAA 60* 55* >60 >60  GFRAA >60 >60 >60 >60    LIVER FUNCTION TESTS: Recent Labs    01/04/19 0415  01/05/19 0250 01/06/19 0401 01/08/19 0505 01/09/19 0500 01/10/19 0442  BILITOT 1.0 1.0 0.6 0.2*  --   --   AST 29 29 31 21   --   --   ALT 17 17 20 15   --   --   ALKPHOS 39 38 49 44  --   --   PROT 5.3* 5.4* 5.6* 5.8*  --   --   ALBUMIN 1.2* 1.2* 1.2* 1.2* 1.1* 1.2*    Assessment and Plan:  History of colonic perforation s/p exploratory laporatomy with total colectomy and ileostomy in OR 12/28/2018 by Dr. , with subsequent development of right lateral peritoneal fluid collection s/p right lateral abdominal drain placement in IR 01/04/2019 by Dr. 12/30/2018. Right lateral abdominal drain stable with approximately 25 cc of thick tan fluid in suction bulb (additional 25 cc output from drain in past 24 hours per chart). Continue current drain management- continue with Qshift flushes/monitor of output. Plan for repeat CT/possible drain injection when output <10 cc/day (assess for possible removal). Further plans per TRH/CCS- appreciate and agree with management. IR to follow.   Electronically Signed: Dwain Sarna, PA-C 01/10/2019, 9:41 AM   I spent a total of 25 Minutes at the the patient's bedside AND on the patient's hospital floor or unit, greater than 50% of which was counseling/coordinating care for right lateral peritoneal fluid collection s/p drain placement.

## 2019-01-10 NOTE — Progress Notes (Signed)
Physical Therapy Treatment Patient Details Name: Roberto [name] MRN: [id number] DOB: 03-Apr-1965 Today's Date: 01/10/2019    History of Present Illness Pt is a 53 y.o. M with history of NICM, EF 25-30%, PSVT, PAF, chronic systolic HF with AICD, DVT/PE/LV thrombus , on chronic anticoagulation who was admitted 12/20/2018 for acute diverticulitis. He was initially treated conservatively, but had no real improvement and was taken to the OR 12/28/2018.Pt was found to have a perforated cecum with leakage of stool throughout abdominal cavity. 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 He was brought from the OR to NICU intubated and on pressors. Extubated 12/16.    PT Comments    Per PA-C, patient clear to perform bed level activities only today and can progress to EOB/OOB tomorrow. Worked on bed level activities including ankle pumps, quad sets, supine hip abduction, heel slides, assisted hip flexion (MaxA to prevent excessive strain on abdomen), isometric  Hip extension, bicep curps, chest presses, wrist flexion/extension, and finger opposition exercises in the bed. VSS during session. He was positioned to comfort with all needs met, bed alarm active. Will plan for progression of activity at next session.   Follow Up Recommendations  CIR     Equipment Recommendations  Other (comment)(TBD with progression)    Recommendations for Other Services       Precautions / Restrictions Precautions Precautions: Fall;Other (comment) Precaution Comments: colostomy, JP drains Restrictions Weight Bearing Restrictions: No    Mobility  Bed Mobility               General bed mobility comments: on bed rest- focus on bed level exercises per PA-C request  Transfers                 General transfer comment: on bed rest- focus on bed level exercises per PA-C request  Ambulation/Gait             General Gait Details: on bed rest- focus on bed level exercises per PA-C  request   Stairs             Wheelchair Mobility    Modified Rankin (Stroke Patients Only)       Balance                                            Cognition Arousal/Alertness: Awake/alert Behavior During Therapy: WFL for tasks assessed/performed Overall Cognitive Status: Within Functional Limits for tasks assessed                                        Exercises      General Comments General comments (skin integrity, edema, etc.): on bed rest- focus on bed level exercises per PA-C request. VSS during session.      Pertinent Vitals/Pain Pain Assessment: No/denies pain Pain Score: 0-No pain Pain Intervention(s): Limited activity within patient's tolerance;Monitored during session    Home Living                      Prior Function            PT Goals (current goals can now be found in the care plan section) Acute Rehab PT Goals Patient Stated Goal: to get OOB PT Goal Formulation: With patient Time For Goal Achievement: 01/16/19  Potential to Achieve Goals: Good Progress towards PT goals: Not progressing toward goals - comment(limited by bedrest order today)    Frequency           PT Plan Current plan remains appropriate    Co-evaluation              AM-PAC PT "6 Clicks" Mobility   Outcome Measure  Help needed turning from your back to your side while in a flat bed without using bedrails?: A Lot Help needed moving from lying on your back to sitting on the side of a flat bed without using bedrails?: A Lot Help needed moving to and from a bed to a chair (including a wheelchair)?: Total Help needed standing up from a chair using your arms (e.g., wheelchair or bedside chair)?: Total Help needed to walk in hospital room?: Total Help needed climbing 3-5 steps with a railing? : Total 6 Click Score: 8    End of Session   Activity Tolerance: Patient tolerated treatment well Patient left: in bed;with  call bell/phone within reach;with bed alarm set   PT Visit Diagnosis: Other abnormalities of gait and mobility (R26.89);Pain;Muscle weakness (generalized) (M62.81) Pain - Right/Left: Right Pain - part of body: (abdomen)     Time: 0626-9485 PT Time Calculation (min) (ACUTE ONLY): 18 min  Charges:  $Therapeutic Exercise: 8-22 mins                     Windell Norfolk, DPT, PN1   Supplemental Physical Therapist [place]    Pager [phone] Acute Rehab Office [phone]

## 2019-01-10 NOTE — Progress Notes (Signed)
[facility] Progress Note  13 Days Post-Op  Subjective: CC-  No new complaints. Abdominal pain about the same. Majority of his pain is centered around his wound. Denies n/v. Tolerating diet. States that he ate about 50% of his dinner last night. Ostomy functioning. Hgb 9.5 today and stable. WBC down trending 15.7, TMAX 99.1  Objective: Vital signs in last 24 hours: Temp:  [97.7 F (36.5 C)-99.1 F (37.3 C)] 98.4 F (36.9 C) (12/28 0809) Pulse Rate:  [72-82] 76 (12/28 0411) Resp:  [18-38] 28 (12/28 0411) BP: (86-112)/(39-74) 89/39 (12/28 0809) SpO2:  [91 %-96 %] 95 % (12/28 0411) Weight:  [147.4 kg] 147.4 kg (12/28 0411) Last BM Date: 01/09/19  Intake/Output from previous day: 12/27 0701 - 12/28 0700 In: 3169 [P.O.:960; I.V.:1578.6; Blood:315; IV Piggyback:315.4] Out: 3105 [Urine:2225; Drains:105; Stool:775] Intake/Output this shift: No intake/output data recorded.  PE: Gen: Alert, NAD, pleasant Card: Regular rate and rhythm Pulm:rate andeffortnormal BPZ:WCHEN, soft,ostomyviablewith liquid nonbloody stool in pouch, drain in LLQ with SS fluid,drain on R side with scant purulent fluid,midlinewound dehisced but no evisceration, somenecrotic tissuearound edges of wound     Lab Results:  Recent Labs    01/09/19 0500 01/09/19 1423 01/10/19 0441  WBC 19.4*  --  15.7*  HGB 6.5* 9.5* 9.5*  HCT 20.3* 30.1* 30.0*  PLT 318  --  276   BMET Recent Labs    01/09/19 0500 01/10/19 0442  NA 132* 134*  K 4.4 5.0  CL 96* 99  CO2 25 25  GLUCOSE 261* 196*  BUN 23* 20  CREATININE 1.23 1.34*  CALCIUM 7.1* 7.6*   PT/INR No results for input(s): LABPROT, INR in the last 72 hours. CMP     Component Value Date/Time   NA 134 (L) 01/10/2019 0442   NA 140 08/26/2018 1355   K 5.0 01/10/2019 0442   CL 99 01/10/2019 0442   CO2 25 01/10/2019 0442   GLUCOSE 196 (H) 01/10/2019 0442   BUN 20 01/10/2019 0442   BUN 14 08/26/2018 1355   CREATININE 1.34  (H) 01/10/2019 0442   CREATININE 1.20 10/29/2016 1236   CALCIUM 7.6 (L) 01/10/2019 0442   PROT 5.8 (L) 01/08/2019 0505   PROT 7.4 12/03/2018 1459   ALBUMIN 1.2 (L) 01/10/2019 0442   ALBUMIN 3.6 (L) 12/03/2018 1459   AST 21 01/08/2019 0505   ALT 15 01/08/2019 0505   ALKPHOS 44 01/08/2019 0505   BILITOT 0.2 (L) 01/08/2019 0505   BILITOT 0.3 12/03/2018 1459   GFRNONAA >60 01/10/2019 0442   GFRAA >60 01/10/2019 0442   Lipase     Component Value Date/Time   LIPASE 27 12/19/2018 2038       Studies/Results: No results found.  Anti-infectives: Anti-infectives (From admission, onward)   Start     Dose/Rate Route Frequency Ordered Stop   12/28/18 1400  fluconazole (DIFLUCAN) IVPB 400 mg     400 mg 100 mL/hr over 120 Minutes Intravenous Every 24 hours 12/28/18 1318 01/09/19 2359   12/20/18 2200  piperacillin-tazobactam (ZOSYN) IVPB 3.375 g     3.375 g 12.5 mL/hr over 240 Minutes Intravenous Every 8 hours 12/20/18 1834 01/09/19 2359   12/20/18 0645  piperacillin-tazobactam (ZOSYN) IVPB 3.375 g  Status:  Discontinued     3.375 g 100 mL/hr over 30 Minutes Intravenous Every 6 hours 12/20/18 0630 12/20/18 1833   12/19/18 2315  piperacillin-tazobactam (ZOSYN) IVPB 3.375 g     3.375 g 100 mL/hr over 30 Minutes Intravenous  Once 12/19/18  2302 12/20/18 0003       Assessment/Plan CHF EF 25-30% Atrial fibrillation Hx DVT/PE LV mural thrombus Gross anticoagulant Morbid obesityBMI 50.63 AKI onCKD Dehydration R elbow pain   POD#13STC/ileostomy fordiverticular stricture of left colon and perforated R colon - pathwithout evidence of malignancy -s/p drain placement 12/22 - cxs E coli pansensitive, abx stopped 12/27. Plan repeat CT once output minimal -ileostomy functioning, tolerating a diet. Prealbumin improving but still low (12.5). Calorie count ordered today prior to discontinuing TPN completely - wound dehisced, monitor closely. BID wet to dry dressing changes -  abdominal binder - plan to d/c bedrest tomorrow  FEN:1/2 TPN,HH/CM diet, ensure, prostat ZOX:WRUEAVW gtt held for ABL anemia 12/27/ hgb stable today ID: IV Zosyn 12/6>>12/27, IV diflucan 12/15>>12/27   LOS: 21 days    Wellington Hampshire, [facility] 01/10/2019, 9:05 AM Please see Amion for pager number during day hours 7:00am-4:30pm

## 2019-01-10 NOTE — Progress Notes (Signed)
ANTICOAGULATION CONSULT NOTE  Pharmacy Consult for Heparin > held > Eliquis Indication: hx VTE, LV mural thrombus, afib   Allergies  Allergen Reactions  . Shrimp [Shellfish Allergy] Hives and Itching    Patient Measurements: Height: 5\' 7"  (170.2 cm) Weight: (!) 324 lb 15.3 oz (147.4 kg) IBW/kg (Calculated) : 66.1 Heparin Dosing Weight:  101.6 kg  Vital Signs: Temp: 98.7 F (37.1 C) (12/28 1226) Temp Source: Oral (12/28 1226) BP: 105/52 (12/28 1226) Pulse Rate: 76 (12/28 0411)  Labs: Recent Labs    01/07/19 2200 01/08/19 0505 01/09/19 0500 01/09/19 1423 01/10/19 0441 01/10/19 0442  HGB  --  8.8* 6.5* 9.5* 9.5*  --   HCT  --  27.2* 20.3* 30.1* 30.0*  --   PLT  --  287 318  --  276  --   HEPARINUNFRC 0.40 0.64 0.61  --   --   --   CREATININE  --  1.45* 1.23  --   --  1.34*    Estimated Creatinine Clearance: 88.9 mL/min (A) (by C-G formula based on SCr of 1.34 mg/dL (H)).   Assessment: 53 y.o. male with h/o Afib and LV thrombus, Eliquis on hold, for IV heparin.  Heparin held 12/27 when Hgb down to 6.5.  Repeat Hgb 9.5, and confirmed this AM remains 9.5.  No overt bleeding noted.  Goal of Therapy:  Heparin level 0.3-0.7 units/ml  Try to keep lower end with low Hg Monitor platelets by anticoagulation protocol: Yes   Plan:  Resume Eliquis 5 mg po BID. Watch CBC.   Thank you for allowing pharmacy to participate in this patient's care.  Marguerite Olea, [facility] Clinical Pharmacist Phone [phone]  01/10/2019 12:53 PM

## 2019-01-10 NOTE — Progress Notes (Signed)
Inpatient Rehabilitation Admissions Coordinator  Patient undergoing bed level exercises only until possibly tomorrow. I await further progress to assist with planning CIR vs SNF rehab.  Danne Baxter, RN, MSN Rehab Admissions Coordinator [phone] 01/10/2019 12:29 PM '

## 2019-01-10 NOTE — Progress Notes (Signed)
Roberto [name]  PROGRESS NOTE    Roberto [name]  FBP:[id number] DOB: [birth year] DOA: 12/19/2018 PCP: Denita Lung, MD   Brief Narrative:   Pt. With history of NICM, EF 25-30%, PSVT, PAF, chronic systolic HF with AICD, hx of DVT/PE/LV thrombus , on chronic anticoagulation ( bridged with heparin), was admitted 12/20/2018 for acute diverticulitis. He was initially treated conservatively, but had no real improvement and was taken to the OR 12/28/2018. Pt had Left colonic stricture of unknown etiology, and perforated cecum. Per surgery's note there was a mass in the mid descending colon. The right colon was ischemic and had perforated. There was visualization of leakage of a large amount of stool throughout his entire abdominal cavity. In OR patient became hypotensive, required pressors (Phenylephrine and Levo). Surgery felt the patient was not ready for extubation, and considering co morbidities and possible septic insult asked PCCM to admit to ICU manage care. Transferred to [facility] service on 01/03/2019 now off pressors.  01/10/19:Spoke with surgery yesterday. Were ok with resuming eliquis if Hgb/bleed stable. Repeat Hgb today. Consider resume eliquis. Possibly to CIR   Assessment & Plan:   Principal Problem:   Acute diverticulitis Active Problems:   Obesity, morbid (HCC)   Nonischemic cardiomyopathy (HCC)   Paroxysmal SVT (supraventricular tachycardia) (HCC)   Chronic systolic heart failure (HCC)   Chronic pain of right knee   History of DVT (deep vein thrombosis)   PAF (paroxysmal atrial fibrillation) (HCC)   Pre-operative cardiovascular examination   Bowel obstruction (HCC)   Respiratory failure (HCC)   Acute on chronic systolic heart failure (HCC)  Septic shock secondary to perforated cecum and contamination of abdominal cavity with stool 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 Kathlene Cote. - Management per interventional radiology; appreciate assistance  Acute hypoxic respiratory failure possibly secondary to postop atelectasis - Normal oxygen supplementation at baseline - on RA; stable  Chronic combined systolic and diastolic CHF - Last 2D echo LVEF 25 to 30% - Status post AICD -watch daily wts; essentially fluid neutral for stay - will entresto when stabilizes  Paroxysmal A. fib History of DVT/PE/LV thrombus - continue digoxin, amiodarone and heparin drip - Cardiology following; appreciate assistance - eliquis when ok w/ surgery     - Hgb down today; he's getting blood, can resume eliquis when Hgb stabilizes     - 01/10/19: Hgb is stable 9.5 - 9.9; ok to resume eliquis, monitor Hgb  Morbid obesity - BMI 54 - Recommend weight loss outpatient once stable  Right elbow pain -resolved  DVT prophylaxis: eliquis Code Status: FULL   Disposition Plan: TBD  Consultants:   Cardiology  General Surgery   ROS:  Reports ab pain, Denies CP, N, V . Remainder 10-pt ROS is negative for all not previously mentioned.  Subjective: "I'm managing, man."  Objective: Vitals:   01/10/19 0127 01/10/19 0411 01/10/19 0809 01/10/19 1226  BP:  101/67 (!) 89/39 (!) 105/52  Pulse: 78 76    Resp: (!) 21 (!) 28  Temp:  98.3 F (36.8 C) 98.4 F (36.9 C) 98.7 F (37.1 C)  TempSrc:  Oral Oral Oral  SpO2: 95% 95%    Weight:  (!) 147.4 kg    Height:        Intake/Output Summary (Last 24  hours) at 01/10/2019 1413 Last data filed at 01/10/2019 1000 Gross per 24 hour  Intake 1636.74 ml  Output 2905 ml  Net -1268.26 ml   Filed Weights   01/08/19 0510 01/09/19 0421 01/10/19 0411  Weight: (!) 146.7 kg (!) 148.1 kg (!) 147.4 kg    Examination:  General: 53 y.o. male resting in bed in NAD Cardiovascular: RRR, +S1, S2, no m/g/r, equal pulses throughout Respiratory: CTABL, no w/r/r, normal WOB GI: BS+, NDNT, abdominal binder in place, ostomy/drain MSK: No c/c; b/l chronic LE edema Neuro: Alert to name, follows commands Psyc: Appropriate interaction and affect, calm/cooperative   Data Reviewed: I have personally reviewed following labs and imaging studies.  CBC: Recent Labs  Lab 01/07/19 1432 01/08/19 0505 01/09/19 0500 01/09/19 1423 01/10/19 0441 01/10/19 1313  WBC 23.4* 21.8* 19.4*  --  15.7* 16.6*  NEUTROABS 18.9* 16.8*  --   --  13.0*  --   HGB 9.3* 8.8* 6.5* 9.5* 9.5* 9.9*  HCT 29.2* 27.2* 20.3* 30.1* 30.0* 30.8*  MCV 89.6 89.2 90.2  --  89.8 89.8  PLT 301 287 318  --  276 269   Basic Metabolic Panel: Recent Labs  Lab 01/05/19 0250 01/06/19 0401 01/07/19 1432 01/08/19 0505 01/09/19 0500 01/10/19 0441 01/10/19 0442  NA 144 141  --  136 132*  --  134*  K 3.9 3.9  --  4.3 4.4  --  5.0  CL 112* 102  --  98 96*  --  99  CO2 25 28  --  29 25  --  25  GLUCOSE 185* 147*  --  180* 261*  --  196*  BUN 21* 22*  --  25* 23*  --  20  CREATININE 1.30* 1.35*  --  1.45* 1.23  --  1.34*  CALCIUM 7.9* 7.9*  --  7.1* 7.1*  --  7.6*  MG 1.9 2.0 2.2 2.3 2.2 2.3  --   PHOS 3.7 4.4  --   --  3.3  --  3.4   GFR: Estimated Creatinine Clearance: 88.9 mL/min (A) (by C-G formula based on SCr of 1.34 mg/dL (H)). Liver Function Tests: Recent Labs  Lab 01/04/19 0415 01/05/19 0250 01/06/19 0401 01/08/19 0505 01/09/19 0500 01/10/19 0442  AST 29 29 31 21   --   --   ALT 17 17 20 15   --   --   ALKPHOS 39 38 49 44  --   --   BILITOT 1.0 1.0 0.6 0.2*  --   --   PROT  5.3* 5.4* 5.6* 5.8*  --   --   ALBUMIN 1.2* 1.2* 1.2* 1.2* 1.1* 1.2*   No results for input(s): LIPASE, AMYLASE in the last 168 hours. No results for input(s): AMMONIA in the last 168 hours. Coagulation Profile: No results for input(s): INR, PROTIME in the last 168 hours. Cardiac Enzymes: No results for input(s): CKTOTAL, CKMB, CKMBINDEX, TROPONINI in the last 168 hours. BNP (last 3 results) No results for input(s): PROBNP in the last 8760 hours. HbA1C: No results for input(s): HGBA1C in the last 72 hours. CBG: Recent Labs  Lab 01/09/19 1923 01/10/19 0017 01/10/19 0409 01/10/19 0741 01/10/19 1219  GLUCAP 168* 160* 194*  140* 165*   Lipid Profile: Recent Labs    01/10/19 0441  TRIG 110   Thyroid Function Tests: No results for input(s): TSH, T4TOTAL, FREET4, T3FREE, THYROIDAB in the last 72 hours. Anemia Panel: No results for input(s): VITAMINB12, FOLATE, FERRITIN, TIBC, IRON, RETICCTPCT in the last 72 hours. Sepsis Labs: No results for input(s): PROCALCITON, LATICACIDVEN in the last 168 hours.  Recent Results (from the past 240 hour(s))  Aerobic/Anaerobic Culture (surgical/deep wound)     Status: None   Collection Time: 01/04/19  4:00 PM   Specimen: Abscess  Result Value Ref Range Status   Specimen Description ABSCESS PERITONEAL  Final   Special Requests Normal  Final   Gram Stain   Final    MODERATE WBC PRESENT, PREDOMINANTLY PMN RARE GRAM POSITIVE RODS    Culture   Final    RARE ESCHERICHIA COLI FEW BACTEROIDES FRAGILIS BETA LACTAMASE POSITIVE Performed at [facility], [address], [place], [place] [address]    Report Status 01/09/2019 FINAL  Final   Organism ID, Bacteria ESCHERICHIA COLI  Final      Susceptibility   Escherichia coli - MIC*    AMPICILLIN <=2 SENSITIVE Sensitive     CEFAZOLIN <=4 SENSITIVE Sensitive     CEFEPIME <=1 SENSITIVE Sensitive     CEFTAZIDIME <=1 SENSITIVE Sensitive     CEFTRIAXONE <=1 SENSITIVE Sensitive      CIPROFLOXACIN <=0.25 SENSITIVE Sensitive     GENTAMICIN <=1 SENSITIVE Sensitive     IMIPENEM <=0.25 SENSITIVE Sensitive     TRIMETH/SULFA <=20 SENSITIVE Sensitive     AMPICILLIN/SULBACTAM <=2 SENSITIVE Sensitive     PIP/TAZO <=4 SENSITIVE Sensitive     * RARE ESCHERICHIA COLI      Radiology Studies: No results found.   Scheduled Meds: . acetaminophen  650 mg Oral Q6H  . amiodarone  200 mg Oral Daily  . apixaban  5 mg Oral BID  . chlorhexidine  15 mL Mouth Rinse BID  . Chlorhexidine Gluconate Cloth  6 each Topical Daily  . digoxin  0.125 mg Intravenous Daily  . feeding supplement (ENSURE ENLIVE)  237 mL Oral TID BM  . feeding supplement (PRO-STAT SUGAR FREE 64)  30 mL Oral TID BM  . insulin aspart  0-20 Units Subcutaneous Q4H  . mouth rinse  15 mL Mouth Rinse q12n4p  . methocarbamol  500 mg Oral TID  . pantoprazole (PROTONIX) IV  40 mg Intravenous Q24H  . sodium chloride flush  10-40 mL Intracatheter Q12H  . sodium chloride flush  5 mL Intracatheter Q8H   Continuous Infusions: . sodium chloride 10 mL/hr at 01/09/19 1800  . sodium chloride    . TPN ADULT (ION) 40 mL/hr at 01/09/19 1800  . TPN ADULT (ION)       LOS: 21 days    Time spent: 25 minutes spent in the coordination of care today.    Teddy Spike, DO [facility]  If 7PM-7AM, please contact night-coverage [email or web address] 01/10/2019, 2:13 PM

## 2019-01-10 NOTE — Progress Notes (Signed)
Per chart review, surgical team now recommending bedrest until roughly 12/29. Spoke with [employer], who gives clearance for bed level activities today but states no OOB activities until 12/29.   Windell Norfolk, DPT, PN1   Supplemental Physical Therapist [facility]    Pager [phone] Acute Rehab Office [phone]

## 2019-01-11 LAB — CBC
HCT: 29.4 % — ABNORMAL LOW (ref 39.0–52.0)
Hemoglobin: 9.3 g/dL — ABNORMAL LOW (ref 13.0–17.0)
MCH: 28.7 pg (ref 26.0–34.0)
MCHC: 31.6 g/dL (ref 30.0–36.0)
MCV: 90.7 fL (ref 80.0–100.0)
Platelets: 254 10*3/uL (ref 150–400)
RBC: 3.24 MIL/uL — ABNORMAL LOW (ref 4.22–5.81)
RDW: 15.8 % — ABNORMAL HIGH (ref 11.5–15.5)
WBC: 15.3 10*3/uL — ABNORMAL HIGH (ref 4.0–10.5)
nRBC: 0.3 % — ABNORMAL HIGH (ref 0.0–0.2)

## 2019-01-11 LAB — GLUCOSE, CAPILLARY
Glucose-Capillary: 142 mg/dL — ABNORMAL HIGH (ref 70–99)
Glucose-Capillary: 146 mg/dL — ABNORMAL HIGH (ref 70–99)
Glucose-Capillary: 152 mg/dL — ABNORMAL HIGH (ref 70–99)
Glucose-Capillary: 161 mg/dL — ABNORMAL HIGH (ref 70–99)
Glucose-Capillary: 169 mg/dL — ABNORMAL HIGH (ref 70–99)
Glucose-Capillary: 173 mg/dL — ABNORMAL HIGH (ref 70–99)

## 2019-01-11 LAB — COMPREHENSIVE METABOLIC PANEL
ALT: 14 U/L (ref 0–44)
AST: 22 U/L (ref 15–41)
Albumin: 1.3 g/dL — ABNORMAL LOW (ref 3.5–5.0)
Alkaline Phosphatase: 59 U/L (ref 38–126)
Anion gap: 8 (ref 5–15)
BUN: 19 mg/dL (ref 6–20)
CO2: 26 mmol/L (ref 22–32)
Calcium: 8 mg/dL — ABNORMAL LOW (ref 8.9–10.3)
Chloride: 102 mmol/L (ref 98–111)
Creatinine, Ser: 1.2 mg/dL (ref 0.61–1.24)
GFR calc Af Amer: >60 mL/min (ref >60)
GFR calc non Af Amer: >60 mL/min (ref >60)
Glucose, Bld: 150 mg/dL — ABNORMAL HIGH (ref 70–99)
Potassium: 4.9 mmol/L (ref 3.5–5.1)
Sodium: 136 mmol/L (ref 135–145)
Total Bilirubin: 0.4 mg/dL (ref 0.3–1.2)
Total Protein: 6.4 g/dL — ABNORMAL LOW (ref 6.5–8.1)

## 2019-01-11 LAB — MAGNESIUM: Magnesium: 2.1 mg/dL (ref 1.7–2.4)

## 2019-01-11 MED ORDER — SODIUM CHLORIDE 0.9 % IV SOLN: INTRAVENOUS | Status: DC

## 2019-01-11 MED ORDER — ADULT MULTIVITAMIN W/MINERALS CH
1.0000 | ORAL_TABLET | Freq: Every day | ORAL | Status: DC
  Administered 2019-01-11 – 2019-01-24 (×14): 1 via ORAL
  Filled 2019-01-11 (×14): qty 1

## 2019-01-11 MED ORDER — STERILE WATER FOR INJECTION IV SOLN
INTRAVENOUS | Status: AC
  Filled 2019-01-11: qty 492.8

## 2019-01-11 NOTE — Progress Notes (Signed)
PHARMACY - TOTAL PARENTERAL NUTRITION CONSULT NOTE  Indication: massive bowel resection  Patient Measurements: Height: 5\' 7"  (170.2 cm) Weight: (!) 319 lb 3.6 oz (144.8 kg) IBW/kg (Calculated) : 66.1 TPN AdjBW (KG): 86.1 Body mass index is 50 kg/m.  Assessment:  20 yom presented to the hospital with abdominal pain found to be acute diverticulitis. He was treated conservatively but without improvement. He was taken to the OR 12/15 and found to have a perforated cecum with leakage of stool throughout the abdominal cavity. He remains on the ventilator with vasopressors. Patient has been NPO or clear liquids since admission so he may be at risk for refeeding syndrome.   Glucose / Insulin: A1c 6.3% - CBGs controlled.  Required 23 units SSI last 24 hrs + 20 units insulin in TPN Electrolytes: all WNL (K high normal) Renal: SCr down 1.2, BUN normalized LFTs / TGs: LFTs / tbili / TG WNL Prealbumin / albumin: prealbumin improved to 12.5, albumin 1.2 Intake / Output; MIVF: drain O/P 30mL, ileostomy O/P 235ml, UOP 0.6 mL/kg/hr GI Imaging: 12/9 Abd xray: Worsening gaseous distension of the proximal colon and now involving much of the small bowel 12/21 CT Abd: Large R-side intra-abdominal abscess, mesenteric edema and few mesenteric fluid collections; probable mild small-bowel ileus; small bilateral pleural effusions 12/14 Abd xray: Persistent bowel dilatation. No free air demonstrable 12/14 CT abd: Worsening distal large-bowel obstruction at the level of the descending colon correlating with an approximately 5 cm in length segment of irregular annular wall thickening with underlying colonic diverticulosis and mild pericolonic fat stranding 12/21 CT Abd: Large R-side intra-abdominal abscess, mesenteric edema and few mesenteric fluid collections; probable mild small-bowel ileus; small bilateral pleural effusions Surgeries / Procedures:  12/10 Flexible sigmoidoscopy 12/15 Exlap, subtotal colectomy and  end ileostomy 12/22 IR drainage of right peritoneal abscess  Central access: PICC 12/28/18 TPN start date: 12/29/18  Nutritional Goals (per RD rec on 12/22): KCal: 2200-2400, Protein: 130-150, Fluid: >/=1.8 L/day  Current Nutrition:  TPN  Heart healthy/carb modified diet - less intake charted 12/28 Ensure Enlive TID - 3 charted given Prostat TID - 1 charted given  Calorie count started 12/28  Plan:  Continue 1/2 TPN per Surgery while RD is doing calorie count TPN at 40 ml/hr provides 74g AA, 150g CHO and 38g ILE for a total of 1188 kCal, meeting ~50% of patient needs Electrolytes in TPN: reduce K further, max acetate Adult multivitamin PO daily (remove MVI and trace elements in TPN) Continue resistant SSI Q4H + 20 units regular insulin in TPN F/U PO intake/calorie count to stop TPN, repeat labs on Thurs  Mallika Sanmiguel D. Mina Marble, PharmD, BCPS, [place] 01/11/2019, 10:10 AM

## 2019-01-11 NOTE — Progress Notes (Addendum)
Occupational Therapy Treatment Patient Details Name: Roberto [name] MRN: [id number] DOB: 1965-10-12 Today's Date: 01/11/2019    History of present illness Pt is a 53 y.o. M with history of NICM, EF 25-30%, PSVT, PAF, chronic systolic HF with AICD, DVT/PE/LV thrombus , on chronic anticoagulation who was admitted 12/20/2018 for acute diverticulitis. He was initially treated conservatively, but had no real improvement and was taken to the OR 12/28/2018.Pt was found to have a perforated cecum with leakage of stool throughout abdominal cavity. 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 He was brought from the OR to NICU intubated and on pressors. Extubated 12/16.   OT comments  Pt making gradual progress towards OT goals, tolerating sitting EOB during this session. Pt requiring two person assist for completion of bed mobility, overall requiring mod-maxA for sitting balance while EOB. Currently requires totalA for LB ADL. Pt mostly with limitations due to pain and fatigue but overall tolerating well. Supine/sitting BP monitored (see vitals section). Pt remains motivated to return to his PLOF. Feel he remains an excellent candidate for CIR level therapy services. Will continue to follow acutely.   Follow Up Recommendations  CIR    Equipment Recommendations  3 in 1 bedside commode;Tub/shower seat          Precautions / Restrictions Precautions Precautions: Fall;Other (comment) Precaution Comments: colostomy, JP drains, needs abdominal binder Restrictions Weight Bearing Restrictions: No       Mobility Bed Mobility Overal bed mobility: Needs Assistance Bed Mobility: Rolling;Sidelying to Sit;Sit to Sidelying Rolling: Max assist;+2 for physical assistance Sidelying to sit: Max assist;+2 for physical assistance     Sit to sidelying: Max assist;+2 for physical assistance General bed mobility comments: MaxAx2 for all functional bed mobility, cues for correct technique to maintain abdominal  precautions and protect wound site  Transfers                 General transfer comment: deferred- pain and fatigue    Balance Overall balance assessment: Needs assistance Sitting-balance support: Bilateral upper extremity supported;Feet supported Sitting balance-Leahy Scale: Poor Sitting balance - Comments: Mod-MaxA for most of session due to posterior lean, faded to MinAx2                                   ADL either performed or assessed with clinical judgement   ADL Overall ADL's : Needs assistance/impaired     Grooming: Minimal assistance;Sitting Grooming Details (indicate cue type and reason): requires assist for sitting balanc             Lower Body Dressing: Total assistance Lower Body Dressing Details (indicate cue type and reason): totalA to don socks at bed level               General ADL Comments: pt tolerating bed mobility and sitting  EOB for brief period, limited due to pain and fatigue                       Cognition Arousal/Alertness: Awake/alert Behavior During Therapy: WFL for tasks assessed/performed;Anxious Overall Cognitive Status: Within Functional Limits for tasks assessed                                 General Comments: appears somewhat anxious with mobility        Exercises     Shoulder  Instructions       General Comments orthostatics negative (see vitals section for details), poor signal on pulse ox but seemed to mostly sat in the 90s on RA    Pertinent Vitals/ Pain       Pain Assessment: Faces Faces Pain Scale: Hurts even more Pain Location: abdominal pain Pain Descriptors / Indicators: Aching;Throbbing;Sore Pain Intervention(s): Limited activity within patient's tolerance;Monitored during session;Premedicated before session;Repositioned  Home Living                                          Prior Functioning/Environment              Frequency  Min 2X/week         Progress Toward Goals  OT Goals(current goals can now be found in the care plan section)  Progress towards OT goals: Progressing toward goals  Acute Rehab OT Goals Patient Stated Goal: to get OOB OT Goal Formulation: With patient Time For Goal Achievement: 01/17/19 Potential to Achieve Goals: Good ADL Goals Pt Will Perform Grooming: with supervision;standing Pt Will Perform Upper Body Dressing: with set-up;sitting Pt Will Perform Lower Body Dressing: sitting/lateral leans;sit to/from stand;with min assist;with adaptive equipment Pt Will Transfer to Toilet: with min assist;ambulating;bedside commode Pt Will Perform Toileting - Clothing Manipulation and hygiene: with mod assist;sitting/lateral leans;sit to/from stand Pt/caregiver will Perform Home Exercise Program: Increased strength;Both right and left upper extremity;With Supervision  Plan Discharge plan remains appropriate    Co-evaluation    PT/OT/SLP Co-Evaluation/Treatment: Yes Reason for Co-Treatment: For patient/therapist safety;Complexity of the patient's impairments (multi-system involvement) PT goals addressed during session: Mobility/safety with mobility;Balance OT goals addressed during session: ADL's and self-care      AM-PAC OT "6 Clicks" Daily Activity     Outcome Measure   Help from another person eating meals?: None Help from another person taking care of personal grooming?: A Little Help from another person toileting, which includes using toliet, bedpan, or urinal?: Total Help from another person bathing (including washing, rinsing, drying)?: A Lot Help from another person to put on and taking off regular upper body clothing?: A Lot Help from another person to put on and taking off regular lower body clothing?: Total 6 Click Score: 13    End of Session Equipment Utilized During Treatment: Oxygen  OT Visit Diagnosis: Unsteadiness on feet (R26.81);Muscle weakness (generalized) (M62.81);Pain Pain  - part of body: (abdomen)   Activity Tolerance Patient tolerated treatment well;Patient limited by pain   Patient Left in bed;with call bell/phone within reach   Nurse Communication Mobility status        Time: 6073-7106 OT Time Calculation (min): 24 min  Charges: OT General Charges $OT Visit: 1 Visit OT Treatments $Self Care/Home Management : 8-22 mins  Marcy Siren, OT Supplemental Rehabilitation Services Pager [phone] Office [phone]   Orlando Penner 01/11/2019, 4:36 PM

## 2019-01-11 NOTE — Plan of Care (Signed)
  Problem: Education: Goal: Knowledge of General Education information will improve Description: Including pain rating scale, medication(s)/side effects and non-pharmacologic comfort measures Outcome: Progressing   Problem: Health Behavior/Discharge Planning: Goal: Ability to manage health-related needs will improve Outcome: Progressing   Problem: Clinical Measurements: Goal: Ability to maintain clinical measurements within normal limits will improve Outcome: Progressing Goal: Diagnostic test results will improve Outcome: Progressing Goal: Respiratory complications will improve Outcome: Progressing Goal: Cardiovascular complication will be avoided Outcome: Progressing   Problem: Activity: Goal: Risk for activity intolerance will decrease Outcome: Progressing   Problem: Nutrition: Goal: Adequate nutrition will be maintained Outcome: Progressing   Problem: Coping: Goal: Level of anxiety will decrease Outcome: Progressing   Problem: Elimination: Goal: Will not experience complications related to bowel motility Outcome: Progressing Goal: Will not experience complications related to urinary retention Outcome: Progressing   Problem: Pain Managment: Goal: General experience of comfort will improve Outcome: Progressing   Problem: Skin Integrity: Goal: Risk for impaired skin integrity will decrease Outcome: Progressing  Elesa Hacker, RN

## 2019-01-11 NOTE — Progress Notes (Signed)
Referring Physician(s): Dr. Reine Just  Supervising Physician: Simonne Come  Patient Status:  [facility] - In-pt  Chief Complaint:  Abdominal pain   Subjective:  53 y.o male history of colonic perforation s/p exploratory laboratory with total colectomy and ileostomy in 12.15.20 complicated by right lateral peritoneal fluid collection. IR placed a drain on 12.22.20. Patient alert and laying in bed. Denies any fevers, headache, chest pain, SOB, cough, abdominal pain, nausea, vomiting or bleeding.      Allergies: Shrimp [shellfish allergy]  Medications: Prior to Admission medications   Medication Sig Start Date End Date Taking? Authorizing Provider  acetaminophen (TYLENOL) 650 MG CR tablet Take 1,300 mg by mouth every 8 (eight) hours as needed for pain.   Yes [provider]  amiodarone (PACERONE) 200 MG tablet Take 1 tablet (200 mg total) by mouth daily. 10/18/18  Yes Bensimhon, Bevelyn Buckles, MD  coconut oil OIL Apply 1 application topically as needed (dry skin).    Yes [provider]  digoxin (LANOXIN) 0.125 MG tablet TAKE 1 TABLET(0.125 MG) BY MOUTH DAILY Patient taking differently: Take 0.125 mg by mouth daily.  11/01/18  Yes Bensimhon, Bevelyn Buckles, MD  ELIQUIS 5 MG TABS tablet TAKE 1 TABLET(5 MG) BY MOUTH TWICE DAILY Patient taking differently: Take 5 mg by mouth 2 (two) times daily.  03/22/18  Yes Bensimhon, Bevelyn Buckles, MD  ENTRESTO 49-51 MG TAKE 1 TABLET BY MOUTH TWICE DAILY Patient taking differently: Take 1 tablet by mouth 2 (two) times daily.  09/15/18  Yes Bensimhon, Bevelyn Buckles, MD  potassium chloride SA (KLOR-CON) 20 MEQ tablet Take 2 tablets (40 mEq total) by mouth 2 (two) times daily. 10/18/18  Yes Bensimhon, Bevelyn Buckles, MD  spironolactone (ALDACTONE) 25 MG tablet TAKE 1 TABLET(25 MG) BY MOUTH DAILY Patient taking differently: Take 25 mg by mouth daily.  12/03/18  Yes [name], MD  torsemide (DEMADEX) 20 MG tablet TAKE 2 TABLETS BY MOUTH TWICE DAILY. STOP  LASIX Patient taking differently: Take 40 mg by mouth 2 (two) times daily.  06/29/18  Yes Bensimhon, Bevelyn Buckles, MD     Vital Signs: BP (!) 88/53 (BP Location: Left Arm)   Pulse 71   Temp 98.2 F (36.8 C) (Oral)   Resp 20   Ht 5\' 7"  (1.702 m)   Wt (!) 319 lb 3.6 oz (144.8 kg)   SpO2 95%   BMI 50.00 kg/m   Physical Exam Vitals and nursing note reviewed.  Constitutional:      Appearance: He is well-developed.  HENT:     Head: Normocephalic.  Pulmonary:     Effort: Pulmonary effort is normal.  Abdominal:     Comments: Left sided surgical drain in place. IR placed right lateral abdominal drain unremarkable with no erythema, tenderness or drainage noted. Patient has abdominal binder in place. Dressing is clean dry and intact. 185 ml of  thick tan fluid noted colored fluid noted in the line to the bulb suction device    Musculoskeletal:        General: Normal range of motion.     Cervical back: Normal range of motion.  Skin:    General: Skin is dry.  Neurological:     Mental Status: He is alert and oriented to person, place, and time.     Imaging: No results found.  Labs:  CBC: Recent Labs    01/09/19 0500 01/09/19 1423 01/10/19 0441 01/10/19 1313 01/11/19 0500  WBC 19.4*  --  15.7* 16.6*  15.3*  HGB 6.5* 9.5* 9.5* 9.9* 9.3*  HCT 20.3* 30.1* 30.0* 30.8* 29.4*  PLT 318  --  276 269 254    COAGS: Recent Labs    12/28/18 0447 12/28/18 1410 12/29/18 0500 12/30/18 0447 12/30/18 1639  INR  --  1.5*  --   --   --   APTT 106*  --  29 32 78*    BMP: Recent Labs    01/08/19 0505 01/09/19 0500 01/10/19 0442 01/11/19 0500  NA 136 132* 134* 136  K 4.3 4.4 5.0 4.9  CL 98 96* 99 102  CO2 29 25 25 26   GLUCOSE 180* 261* 196* 150*  BUN 25* 23* 20 19  CALCIUM 7.1* 7.1* 7.6* 8.0*  CREATININE 1.45* 1.23 1.34* 1.20  GFRNONAA 55* >60 >60 >60  GFRAA >60 >60 >60 >60    LIVER FUNCTION TESTS: Recent Labs    01/05/19 0250 01/06/19 0401 01/08/19 0505  01/09/19 0500 01/10/19 0442 01/11/19 0500  BILITOT 1.0 0.6 0.2*  --   --  0.4  AST 29 31 21   --   --  22  ALT 17 20 15   --   --  14  ALKPHOS 38 49 44  --   --  59  PROT 5.4* 5.6* 5.8*  --   --  6.4*  ALBUMIN 1.2* 1.2* 1.2* 1.1* 1.2* 1.3*    Assessment and Plan:  53 y.o male history of colonic perforation s/p exploratory laporatory with total colectomy and ileostomy in 53.66.44 complicated by right lateral peritoneal fluid collection. IR placed a drain on 12.22.20.  Per Epic output is:  20 ml, 25 ml, 30 ml.  WBC is 15.3. No temperature  Recommend team continue with flushing TID, output recording q shift and dressing changes as needed. Would consider additional imaging when output is less than 10 ml for 24 hours not including flush material. Further plans per TRH/CCS.   Electronically Signed: Avel Peace, NP 01/11/2019, 11:28 AM   I spent a total of 25 Minutes at the the patient's bedside AND on the patient's hospital floor or unit, greater than 50% of which was counseling/coordinating care for right lateral peritoneal fluid abscess drain.

## 2019-01-11 NOTE — Progress Notes (Addendum)
14 Days Post-Op    CC: Abdominal pain  Subjective: Picture yesterday is pretty much as it appears today.  We have ordered the Adaptic to place over the wound and I have gone over the plan with the nursing staff.  He did have some cloudy drainage in the very base of the wound and I showed this to the nurse and asked him to make sure the dressing goes to the very distal part of this wound.  JP drain is serous fluid.  I cannot see his ileostomy the bag is full and the nurse said he emptied it once already this a.m. so output appears to be increasing from his ileostomy.  Objective: Vital signs in last 24 hours: Temp:  [98.2 F (36.8 C)-99.3 F (37.4 C)] 98.2 F (36.8 C) (12/29 0756) Pulse Rate:  [71-75] 71 (12/29 0756) Resp:  [20-22] 20 (12/29 0756) BP: (88-105)/(43-55) 88/53 (12/29 0756) SpO2:  [94 %-95 %] 95 % (12/29 0756) Weight:  [144.8 kg] 144.8 kg (12/29 0327) Last BM Date: 01/11/19 360 p.o. 396 IV 2200 urine 35 drain 275 stool Afebrile; blood pressure 90s to 100 range. BMI 49.99 Glucose 150 Creatinine 1.2 BC 15.3 H/H 9.3/29.4  Intake/Output from previous day: 12/28 0701 - 12/29 0700 In: 756.1 [P.O.:360; I.V.:396.1] Out: 2510 [Urine:2200; Drains:35; Stool:275] Intake/Output this shift: No intake/output data recorded.  General appearance: alert, cooperative, no distress and Says he is eating more. Resp: clear to auscultation bilaterally and Anterior exam GI: Open abdomen is much is pictured before.  I can see his ileostomy but the ileostomy bag is full for the second time this AM.  Open wound is unchanged wound in place Adaptic at the base of the wound with wet-to-dry's on top.  I discussed this with the nursing staff. Extremities: +1 edema both lower extremities.  Lab Results:  Recent Labs    01/10/19 1313 01/11/19 0500  WBC 16.6* 15.3*  HGB 9.9* 9.3*  HCT 30.8* 29.4*  PLT 269 254    BMET Recent Labs    01/10/19 0442 01/11/19 0500  NA 134* 136  K 5.0 4.9   CL 99 102  CO2 25 26  GLUCOSE 196* 150*  BUN 20 19  CREATININE 1.34* 1.20  CALCIUM 7.6* 8.0*   PT/INR No results for input(s): LABPROT, INR in the last 72 hours.  Recent Labs  Lab 01/05/19 0250 01/06/19 0401 01/08/19 0505 01/09/19 0500 01/10/19 0442 01/11/19 0500  AST 29 31 21   --   --  22  ALT 17 20 15   --   --  14  ALKPHOS 38 49 44  --   --  59  BILITOT 1.0 0.6 0.2*  --   --  0.4  PROT 5.4* 5.6* 5.8*  --   --  6.4*  ALBUMIN 1.2* 1.2* 1.2* 1.1* 1.2* 1.3*     Lipase     Component Value Date/Time   LIPASE 27 12/19/2018 2038     Medications: . acetaminophen  650 mg Oral Q6H  . amiodarone  200 mg Oral Daily  . apixaban  5 mg Oral BID  . chlorhexidine  15 mL Mouth Rinse BID  . Chlorhexidine Gluconate Cloth  6 each Topical Daily  . digoxin  0.125 mg Intravenous Daily  . feeding supplement (ENSURE ENLIVE)  237 mL Oral TID BM  . feeding supplement (PRO-STAT SUGAR FREE 64)  30 mL Oral TID BM  . insulin aspart  0-20 Units Subcutaneous Q4H  . mouth rinse  15 mL  Mouth Rinse q12n4p  . methocarbamol  500 mg Oral TID  . pantoprazole (PROTONIX) IV  40 mg Intravenous Q24H  . sodium chloride flush  10-40 mL Intracatheter Q12H  . sodium chloride flush  5 mL Intracatheter Q8H   . sodium chloride 10 mL/hr at 01/09/19 1800  . sodium chloride    . TPN ADULT (ION) 40 mL/hr at 01/10/19 1727    Assessment/Plan CHF EF 25-30% Atrial fibrillation Hx DVT/PE LV mural thrombus Gross anticoagulant Morbid obesityBMI 50.63 AKI onCKD Dehydration R elbow pain  Severe malnutrition prealbumin: 6.1 >> 12.5(12/29)  Left colonic stricture of unknown etiology; with perforated cecum POD#14STC/ileostomy fordiverticular stricture of left colon and perforated R colon12/15/2020, Dr. Emelia Loron - pathwithout evidence of malignancy -s/p drain placement 12/22 - cxsE coli pansensitive, abx stopped 12/27. Plan repeat CT once output minimal -ileostomy functioning, tolerating a  diet. Prealbumin improving but still low (12.5). Calorie count ordered today prior to discontinuing TPN completely - wound dehisced, monitor closely. BID wet to dry dressing changes - abdominal binder - plan to d/c bedrest today, he needs to have binder on to be up.   FEN:1/2 TPN,HH/CM diet, ensure, prostat TIR:[id number]  >> 12/28 ID: IV Zosyn 12/6>>12/27, IV diflucan 12/15>>12/27    Plan: Place Adaptic at the base of the wound, continue wet-to-dry dressings.  Calorie count is pending.  Continue current diet, and TPN.   LOS: 22 days    Roberto [name] 01/11/2019 Please see Amion

## 2019-01-11 NOTE — Progress Notes (Signed)
PROGRESS NOTE    Roberto [name]  HYW:[id number] DOB: [birth year] DOA: 12/19/2018 PCP: [name], MD   Brief Narrative: Pt. With history of NICM, EF 25-30%, PSVT, PAF, chronic systolic HF with AICD, hx of DVT/PE/LV thrombus , on chronic anticoagulation ( bridged with heparin), was admitted 12/20/2018 for acute diverticulitis. He was initially treated conservatively, but had no real improvement and was taken to the OR 12/28/2018. Pt had Left colonic stricture of unknown etiology, and perforated cecum. Per surgery's note there was a mass in the mid descending colon. The right colon was ischemic and had perforated. There was visualization of leakage of a large amount of stool throughout his entire abdominal cavity. In OR patient became hypotensive, required pressors (Phenylephrine and Levo). Surgery felt the patient was not ready for extubation, and considering co morbidities and possible septic insult asked PCCM to admit to ICU manage care. Transferred to [facility] service on 01/03/2019 now off pressors  Assessment & Plan:   Principal Problem:   Acute diverticulitis Active Problems:   Obesity, morbid (HCC)   Nonischemic cardiomyopathy (HCC)   Paroxysmal SVT (supraventricular tachycardia) (HCC)   Chronic systolic heart failure (HCC)   Chronic pain of right knee   History of DVT (deep vein thrombosis)   PAF (paroxysmal atrial fibrillation) (HCC)   Pre-operative cardiovascular examination   Bowel obstruction (HCC)   Respiratory failure (HCC)   Acute on chronic systolic heart failure (HCC)    Septic shock secondary to perforated cecum and contamination of abdominal cavity with stool s/p subtotal colectomy and ileostomy left colon mass - now off pressors - Wound care and drain management per surgery; pushing diet?; rec 3x day dressing changE - afebrile,s/p abscess drain, patient tolerating abdominal binder.  Patient was treated with Zosyn and Diflucan which has been stopped  01/09/2019 Peritoneal abscess status post CT-guided drainage by interventional radiologist on 01/04/2019, Dr. Fredia Sorrow. - Management per interventional radiology; appreciate assistance - ON TPN  Acute hypoxic respiratory failure possibly secondary to postop atelectasis - Normal oxygen supplementation at baseline - on RA; stable  Chronic combined systolic and diastolic CHF - Last 2D echo LVEF 25 to 30% - Status post AICD -watch daily wts;WEIGHT 144 DOWN FROM 148 - will start entresto when stabilizes     -Creatinine 1.20 down from 1.34, potassium 4.9 magnesium 2.1.  Paroxysmal A. fib History of DVT/PE/LV thrombus - continue digoxin, amiodarone - Cardiology following; appreciate assistance - eliquis when ok w/ surgery Eliquis has been resumed 01/10/2019.  Morbid obesity - BMI 54 - Recommend weight loss outpatient once stable  Right elbow pain -resolved  Hypotension blood pressure 88/53.  We will start normal saline 75 cc an hour.  Check orthostatics.   Nutrition Problem: Inadequate oral intake Etiology: inability to eat     Signs/Symptoms: NPO status    Interventions: TPN  Estimated body mass index is 50 kg/m as calculated from the following:   Height as of this encounter: 5\' 7"  (1.702 m).   Weight as of this encounter: 144.8 kg.  DVT prophylaxis: eliquis Code Status: FULL   Disposition Plan: TBD  Consultants:   Cardiology  General Surgery  Subjective: RESTING IN BED NO SPECIFIC C/O  Objective: Vitals:   01/11/19 0100 01/11/19 0325 01/11/19 0327 01/11/19 0756  BP:  (!) 103/55  (!) 88/53  Pulse: 72 72  71  Resp: (!) 22 20  20   Temp:  99 F (37.2 C)  98.2 F (36.8 C)  TempSrc:  Oral  Oral  SpO2: 95% 94%  95%  Weight:   (!) 144.8 kg   Height:        Intake/Output Summary (Last 24 hours) at 01/11/2019 1239 Last data filed at 01/11/2019 1100 Gross per 24 hour  Intake 636.09 ml  Output  3585 ml  Net -2948.91 ml   Filed Weights   01/09/19 0421 01/10/19 0411 01/11/19 0327  Weight: (!) 148.1 kg (!) 147.4 kg (!) 144.8 kg    Examination:  General exam: Appears calm and comfortable  Respiratory system: Clear to auscultation. Respiratory effort normal. Cardiovascular system: S1 & S2 heard, RRR. No JVD, murmurs, rubs, gallops or clicks. No pedal edema. Gastrointestinal system: Abdomen is nondistended, soft and nontender. No organomegaly or masses felt. Normal bowel sounds heard.  Left-sided surgical drain with right abdominal drain.DEHISCED WOUND  Central nervous system: Alert and oriented. No focal neurological deficits. Extremities: 1+ pitting edema  skin: No rashes, lesions or ulcers Psychiatry: Judgement and insight appear normal. Mood & affect appropriate.     Data Reviewed: I have personally reviewed following labs and imaging studies  CBC: Recent Labs  Lab 01/07/19 1432 01/08/19 0505 01/09/19 0500 01/09/19 1423 01/10/19 0441 01/10/19 1313 01/11/19 0500  WBC 23.4* 21.8* 19.4*  --  15.7* 16.6* 15.3*  NEUTROABS 18.9* 16.8*  --   --  13.0*  --   --   HGB 9.3* 8.8* 6.5* 9.5* 9.5* 9.9* 9.3*  HCT 29.2* 27.2* 20.3* 30.1* 30.0* 30.8* 29.4*  MCV 89.6 89.2 90.2  --  89.8 89.8 90.7  PLT 301 287 318  --  276 269 254   Basic Metabolic Panel: Recent Labs  Lab 01/05/19 0250 01/06/19 0401 01/07/19 1432 01/08/19 0505 01/09/19 0500 01/10/19 0441 01/10/19 0442 01/11/19 0500  NA 144 141  --  136 132*  --  134* 136  K 3.9 3.9  --  4.3 4.4  --  5.0 4.9  CL 112* 102  --  98 96*  --  99 102  CO2 25 28  --  29 25  --  25 26  GLUCOSE 185* 147*  --  180* 261*  --  196* 150*  BUN 21* 22*  --  25* 23*  --  20 19  CREATININE 1.30* 1.35*  --  1.45* 1.23  --  1.34* 1.20  CALCIUM 7.9* 7.9*  --  7.1* 7.1*  --  7.6* 8.0*  MG 1.9 2.0 2.2 2.3 2.2 2.3  --  2.1  PHOS 3.7 4.4  --   --  3.3  --  3.4  --    GFR: Estimated Creatinine Clearance: 98.3 mL/min (by C-G formula based on  SCr of 1.2 mg/dL). Liver Function Tests: Recent Labs  Lab 01/05/19 0250 01/06/19 0401 01/08/19 0505 01/09/19 0500 01/10/19 0442 01/11/19 0500  AST 29 31 21   --   --  22  ALT 17 20 15   --   --  14  ALKPHOS 38 49 44  --   --  59  BILITOT 1.0 0.6 0.2*  --   --  0.4  PROT 5.4* 5.6* 5.8*  --   --  6.4*  ALBUMIN 1.2* 1.2* 1.2* 1.1* 1.2* 1.3*   No results for input(s): LIPASE, AMYLASE in the last 168 hours. No results for input(s): AMMONIA in the last 168 hours. Coagulation Profile: No results for input(s): INR, PROTIME in the last 168 hours. Cardiac Enzymes: No results for input(s): CKTOTAL, CKMB, CKMBINDEX, TROPONINI in the last 168 hours. BNP (last 3  results) No results for input(s): PROBNP in the last 8760 hours. HbA1C: No results for input(s): HGBA1C in the last 72 hours. CBG: Recent Labs  Lab 01/10/19 1939 01/10/19 2323 01/11/19 0320 01/11/19 0857 01/11/19 1134  GLUCAP 159* 152* 161* 173* 146*   Lipid Profile: Recent Labs    01/10/19 0441  TRIG 110   Thyroid Function Tests: No results for input(s): TSH, T4TOTAL, FREET4, T3FREE, THYROIDAB in the last 72 hours. Anemia Panel: No results for input(s): VITAMINB12, FOLATE, FERRITIN, TIBC, IRON, RETICCTPCT in the last 72 hours. Sepsis Labs: No results for input(s): PROCALCITON, LATICACIDVEN in the last 168 hours.  Recent Results (from the past 240 hour(s))  Aerobic/Anaerobic Culture (surgical/deep wound)     Status: None   Collection Time: 01/04/19  4:00 PM   Specimen: Abscess  Result Value Ref Range Status   Specimen Description ABSCESS PERITONEAL  Final   Special Requests Normal  Final   Gram Stain   Final    MODERATE WBC PRESENT, PREDOMINANTLY PMN RARE GRAM POSITIVE RODS    Culture   Final    RARE ESCHERICHIA COLI FEW BACTEROIDES FRAGILIS BETA LACTAMASE POSITIVE Performed at [facility], [address] [place], [place] [address]    Report Status 01/09/2019 FINAL  Final   Organism ID, Bacteria  ESCHERICHIA COLI  Final      Susceptibility   Escherichia coli - MIC*    AMPICILLIN <=2 SENSITIVE Sensitive     CEFAZOLIN <=4 SENSITIVE Sensitive     CEFEPIME <=1 SENSITIVE Sensitive     CEFTAZIDIME <=1 SENSITIVE Sensitive     CEFTRIAXONE <=1 SENSITIVE Sensitive     CIPROFLOXACIN <=0.25 SENSITIVE Sensitive     GENTAMICIN <=1 SENSITIVE Sensitive     IMIPENEM <=0.25 SENSITIVE Sensitive     TRIMETH/SULFA <=20 SENSITIVE Sensitive     AMPICILLIN/SULBACTAM <=2 SENSITIVE Sensitive     PIP/TAZO <=4 SENSITIVE Sensitive     * RARE ESCHERICHIA COLI         Radiology Studies: No results found.      Scheduled Meds: . acetaminophen  650 mg Oral Q6H  . amiodarone  200 mg Oral Daily  . apixaban  5 mg Oral BID  . chlorhexidine  15 mL Mouth Rinse BID  . Chlorhexidine Gluconate Cloth  6 each Topical Daily  . digoxin  0.125 mg Intravenous Daily  . feeding supplement (ENSURE ENLIVE)  237 mL Oral TID BM  . feeding supplement (PRO-STAT SUGAR FREE 64)  30 mL Oral TID BM  . insulin aspart  0-20 Units Subcutaneous Q4H  . mouth rinse  15 mL Mouth Rinse q12n4p  . methocarbamol  500 mg Oral TID  . multivitamin with minerals  1 tablet Oral Daily  . pantoprazole (PROTONIX) IV  40 mg Intravenous Q24H  . sodium chloride flush  10-40 mL Intracatheter Q12H  . sodium chloride flush  5 mL Intracatheter Q8H   Continuous Infusions: . sodium chloride 10 mL/hr at 01/09/19 1800  . sodium chloride    . TPN ADULT (ION) 40 mL/hr at 01/10/19 1727  . TPN ADULT (ION)       LOS: 22 days     Georgette Shell, MD [facility]  If 7PM-7AM, please contact night-coverage [email or web address] Password [facility] 01/11/2019, 12:39 PM

## 2019-01-11 NOTE — Progress Notes (Signed)
Physical Therapy Treatment Patient Details Name: RUARI MUDGETT MRN: [id number] DOB: 02-17-1965 Today's Date: 01/11/2019    History of Present Illness Pt is a 53 y.o. M with history of NICM, EF 25-30%, PSVT, PAF, chronic systolic HF with AICD, DVT/PE/LV thrombus , on chronic anticoagulation who was admitted 12/20/2018 for acute diverticulitis. He was initially treated conservatively, but had no real improvement and was taken to the OR 12/28/2018.Pt was found to have a perforated cecum with leakage of stool throughout abdominal cavity. 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 He was brought from the OR to NICU intubated and on pressors. Extubated 12/16.    PT Comments    RN reports patient OK to participate in session including EOB activities today. Required a heavy amount of assistance for all bed mobility (max-MaxAx2), and once seated at EOB had strong posterior lean requiring Mod-MaxA to maintain upright but faded to MinAx1-2. Fatigued easily today, able to tolerate sitting perhaps 7-8 minutes this session. Cues provided for safe mobility to protect abdomen and wound sites. He was returned to bed with MaxAx2 and positioned to comfort with bed alarm active, all needs otherwise met this afternoon.     Follow Up Recommendations  CIR     Equipment Recommendations  Other (comment)(defer)    Recommendations for Other Services       Precautions / Restrictions Precautions Precautions: Fall;Other (comment) Precaution Comments: colostomy, JP drains, needs abdominal binder Restrictions Weight Bearing Restrictions: No    Mobility  Bed Mobility Overal bed mobility: Needs Assistance Bed Mobility: Rolling;Sidelying to Sit;Sit to Sidelying Rolling: Max assist;+2 for physical assistance Sidelying to sit: Max assist;+2 for physical assistance     Sit to sidelying: Max assist;+2 for physical assistance General bed mobility comments: MaxAx2 for all functional bed mobility, cues for  correct technique to maintain abdominal precautions and protect wound site  Transfers                 General transfer comment: deferred- pain and fatigue  Ambulation/Gait             General Gait Details: deferred- pain and fatigue   Stairs             Wheelchair Mobility    Modified Rankin (Stroke Patients Only)       Balance Overall balance assessment: Needs assistance Sitting-balance support: Bilateral upper extremity supported;Feet supported Sitting balance-Leahy Scale: Poor Sitting balance - Comments: Mod-MaxA for most of session due to posterior lean, faded to MinAx2                                    Cognition Arousal/Alertness: Awake/alert Behavior During Therapy: [facility] for tasks assessed/performed;Anxious Overall Cognitive Status: Within Functional Limits for tasks assessed                                 General Comments: but seemed to become somewhat anxious with mobility      Exercises      General Comments General comments (skin integrity, edema, etc.): orthostatics negative (see vitals section for details), poor signal on pulse ox but seemed to mostly sat in the 90s      Pertinent Vitals/Pain Pain Assessment: Faces Faces Pain Scale: Hurts even more Pain Location: abdominal pain Pain Descriptors / Indicators: Aching;Throbbing;Sore Pain Intervention(s): Limited activity within patient's tolerance;Monitored during session  Home Living                      Prior Function            PT Goals (current goals can now be found in the care plan section) Acute Rehab PT Goals Patient Stated Goal: to get OOB PT Goal Formulation: With patient Time For Goal Achievement: 01/16/19 Potential to Achieve Goals: Good Progress towards PT goals: Progressing toward goals    Frequency    Min 3X/week      PT Plan Current plan remains appropriate    Co-evaluation PT/OT/SLP Co-Evaluation/Treatment:  Yes Reason for Co-Treatment: For patient/therapist safety;To address functional/ADL transfers;Complexity of the patient's impairments (multi-system involvement) PT goals addressed during session: Mobility/safety with mobility;Balance        AM-PAC PT "6 Clicks" Mobility   Outcome Measure  Help needed turning from your back to your side while in a flat bed without using bedrails?: A Lot Help needed moving from lying on your back to sitting on the side of a flat bed without using bedrails?: A Lot Help needed moving to and from a bed to a chair (including a wheelchair)?: Total Help needed standing up from a chair using your arms (e.g., wheelchair or bedside chair)?: Total Help needed to walk in hospital room?: Total Help needed climbing 3-5 steps with a railing? : Total 6 Click Score: 8    End of Session Equipment Utilized During Treatment: Other (comment)(abdominal binder) Activity Tolerance: Patient tolerated treatment well Patient left: in bed;with call bell/phone within reach;with bed alarm set   PT Visit Diagnosis: Other abnormalities of gait and mobility (R26.89);Pain;Muscle weakness (generalized) (M62.81) Pain - Right/Left: Right Pain - part of body: (abdomen)     Time: 4392-6599 PT Time Calculation (min) (ACUTE ONLY): 24 min  Charges:  $Therapeutic Activity: 8-22 mins                     Windell Norfolk, DPT, PN1   Supplemental Physical Therapist [place]    Pager [phone] Acute Rehab Office [phone]

## 2019-01-11 NOTE — Progress Notes (Addendum)
Nutrition Follow-up  DOCUMENTATION CODES:   Morbid obesity  INTERVENTION:   -Calorie Count -48 hour (12/28-12/29) -Ensure Enlive po TID, each supplement provides 350 kcal and 20 grams of protein -Prostat liquid protein PO 30 ml BID with meals, each supplement provides 100 kcal, 15 grams protein.  -TPN management per Pharmacy  NUTRITION DIAGNOSIS:   Inadequate oral intake related to inability to eat as evidenced by NPO status.  Now on HH/CHO modified diet  GOAL:   Patient will meet greater than or equal to 90% of their needs  Progressing.  MONITOR:   PO intake, Supplement acceptance, Labs, Weight trends, I & O's(TPN)  REASON FOR ASSESSMENT:   Consult Calorie Count  ASSESSMENT:   53 y/o gentleman with a history of chronic HFrEF 2/2 NICM, chronic AC for history of PE and DVT, paoxysmal Afib, and history of LV thrombus (Eliquis PTA) who had a distal bowel obstruction and perforation due to a colonic stricture who underwent ex-lap with a subtotal colectomy and diverting ileostomy. He required vasopressors intraoperatively and was brought to the ICU for continued vent management and septic shock due to intrabdominal sepsis.   Procedure (12/15): Exploratory laparotomy Subtotal colectomy End ileostomy 12/16 extubated; TPN started; plan to continue NG until 12/18 then can possibly adv diet 12/17 extubated 12/18 NGT d/c 12/22 CT guided drainage of peritoneal abscess  RD was consulted to begin Calorie Count 12/28-12/29. Goal is for TPN to be weaned off.   Calorie Count 12/28: B: 105 kcals, 5g protein L: 0% -meal tray never ordered, pt refused D: not documented Supplements: 1150 kcals, 75g protein Total: 1255 kcals (57% of needs), 80g protein (61% of needs)  TPN has been decreased to half rate, 40 ml/hr (providing 1188 kcals, 73g protein).  Today pt is eating better, if continues to eat well with lunch and dinner and take supplements, can most likely have TPN weaned  off.  Admission weight: 333 lbs. Current weight: 319 lbs.  I/Os: +235 ml since 12/15 UOP: 2.2L x 24 hrs Drain OP: 35 ml Ileostomy OP: 350 ml  Medications: Multivitamin with minerals daily  Labs reviewed: CBGs: 146-173  Diet Order:   Diet Order            Diet heart healthy/carb modified Room service appropriate? Yes; Fluid consistency: Thin  Diet effective now              EDUCATION NEEDS:   Not appropriate for education at this time  Skin:  Skin Assessment: Skin Integrity Issues: Skin Integrity Issues:: Incisions Incisions: abdomen  Last BM:  12/29 (75 ml via ileostomy)  Height:   Ht Readings from Last 1 Encounters:  12/31/18 5\' 7"  (1.702 m)    Weight:   Wt Readings from Last 1 Encounters:  01/11/19 (!) 144.8 kg    Ideal Body Weight:  67.27 kg  BMI:  Body mass index is 50 kg/m.  Estimated Nutritional Needs:   Kcal:  2200-2400  Protein:  130-150 grams  Fluid:  >/= 1.8 L/day  Clayton Bibles, MS, RD, LDN Inpatient Clinical Dietitian Pager: [phone] After Hours Pager: [phone]

## 2019-01-12 LAB — GLUCOSE, CAPILLARY
Glucose-Capillary: 150 mg/dL — ABNORMAL HIGH (ref 70–99)
Glucose-Capillary: 139 mg/dL — ABNORMAL HIGH (ref 70–99)
Glucose-Capillary: 189 mg/dL — ABNORMAL HIGH (ref 70–99)
Glucose-Capillary: 162 mg/dL — ABNORMAL HIGH (ref 70–99)
Glucose-Capillary: 147 mg/dL — ABNORMAL HIGH (ref 70–99)
Glucose-Capillary: 159 mg/dL — ABNORMAL HIGH (ref 70–99)

## 2019-01-12 LAB — CBC
HCT: 28.7 % — ABNORMAL LOW (ref 39.0–52.0)
Hemoglobin: 9 g/dL — ABNORMAL LOW (ref 13.0–17.0)
MCH: 28.8 pg (ref 26.0–34.0)
MCHC: 31.4 g/dL (ref 30.0–36.0)
MCV: 91.7 fL (ref 80.0–100.0)
Platelets: 274 10*3/uL (ref 150–400)
RBC: 3.13 MIL/uL — ABNORMAL LOW (ref 4.22–5.81)
RDW: 15.7 % — ABNORMAL HIGH (ref 11.5–15.5)
WBC: 12.8 10*3/uL — ABNORMAL HIGH (ref 4.0–10.5)
nRBC: 0.2 % (ref 0.0–0.2)

## 2019-01-12 MED ORDER — INSULIN ASPART 100 UNIT/ML ~~LOC~~ SOLN
0.0000 [IU] | Freq: Three times a day (TID) | SUBCUTANEOUS | Status: DC
  Administered 2019-01-12 – 2019-01-13 (×3): 4 [IU] via SUBCUTANEOUS
  Administered 2019-01-13 – 2019-01-15 (×3): 3 [IU] via SUBCUTANEOUS
  Administered 2019-01-16: 4 [IU] via SUBCUTANEOUS
  Administered 2019-01-17 (×2): 3 [IU] via SUBCUTANEOUS
  Administered 2019-01-18: 4 [IU] via SUBCUTANEOUS
  Administered 2019-01-18 – 2019-01-24 (×9): 3 [IU] via SUBCUTANEOUS

## 2019-01-12 MED ORDER — PANTOPRAZOLE SODIUM 40 MG PO TBEC
40.0000 mg | DELAYED_RELEASE_TABLET | Freq: Every day | ORAL | Status: DC
  Administered 2019-01-12 – 2019-01-24 (×13): 40 mg via ORAL
  Filled 2019-01-12 (×13): qty 1

## 2019-01-12 MED ORDER — DIGOXIN 125 MCG PO TABS
0.1250 mg | ORAL_TABLET | Freq: Every day | ORAL | Status: DC
  Administered 2019-01-13 – 2019-01-24 (×12): 0.125 mg via ORAL
  Filled 2019-01-12 (×12): qty 1

## 2019-01-12 MED ORDER — INSULIN ASPART 100 UNIT/ML ~~LOC~~ SOLN: 0.0000 [IU] | Freq: Every day | SUBCUTANEOUS | Status: DC

## 2019-01-12 NOTE — Progress Notes (Signed)
PROGRESS NOTE    Roberto [name]  OEV:[id number] DOB: 09-04-1965 DOA: 12/19/2018 PCP: [name], MD    Brief Narrative:Pt. With history of NICM, EF 25-30%, PSVT, PAF, chronic systolic HF with AICD, hx of DVT/PE/LV thrombus , on chronic anticoagulation ( bridged with heparin), was admitted 12/20/2018 for acute diverticulitis. He was initially treated conservatively, but had no real improvement and was taken to the OR 12/28/2018. Pt had Left colonic stricture of unknown etiology, and perforated cecum. Per surgery's note there was a mass in the mid descending colon. The right colon was ischemic and had perforated. There was visualization of leakage of a large amount of stool throughout his entire abdominal cavity. In OR patient became hypotensive, required pressors (Phenylephrine and Levo). Surgery felt the patient was not ready for extubation, and considering co morbidities and possible septic insult asked PCCM to admit to ICU manage care. Transferred to [facility] service on 01/03/2019 now off pressors  Assessment & Plan:   Principal Problem:   Acute diverticulitis Active Problems:   Obesity, morbid (HCC)   Nonischemic cardiomyopathy (HCC)   Paroxysmal SVT (supraventricular tachycardia) (HCC)   Chronic systolic heart failure (HCC)   Chronic pain of right knee   History of DVT (deep vein thrombosis)   PAF (paroxysmal atrial fibrillation) (HCC)   Pre-operative cardiovascular examination   Bowel obstruction (HCC)   Respiratory failure (HCC)   Acute on chronic systolic heart failure (HCC)  Septic shock secondary to perforated cecum and contamination of abdominal cavity with stool s/p subtotal colectomy and ileostomy-dehisced abdominal wound.  Wet-to-dry dressing changes twice a day per surgery. Surgery planning on DC TPN 01/12/2019.  Patient tolerating p.o. intake. CIR notes reviewed.  Will consult social worker for SNF. Patient not a candidate for CIR at this time. - afebrile,s/p  abscess drain, patient tolerating abdominal binder.  Patient was treated with Zosyn and Diflucan which has been stopped 01/09/2019 Peritoneal abscess status post CT-guided drainage by interventional radiologist on 01/04/2019, Dr. Fredia Sorrow. - Management per interventional radiology; appreciate assistance -Plan to repeat CT of the abdomen once output is less than 10 cc.  Acute hypoxic respiratory failure possibly secondary to postop atelectasis - Normal oxygen supplementation at baseline - on RA; stable  Chronic combined systolic and diastolic CHF-negative by 2.7 L. - Last 2D echo LVEF 25 to 30% - Status post AICD -watch daily wts;WEIGHT 144.3 DOWN FROM 148 - will start entresto when stabilizes     -Creatinine 1.20 down from 1.34, potassium 4.9 magnesium 2.1.  Labs labs in a.m.  Paroxysmal A. fib History of DVT/PE/LV thrombus - continue digoxin, amiodarone - Cardiology following; appreciate assistance Eliquis restarted 01/10/2019. Morbid obesity - BMI 54 - Recommend weight loss outpatient once stable  Right elbow pain -resolved  Hypotension blood pressure 102/57.  He is not orthostatic.  Will DC normal saline.   Nutrition Problem: Inadequate oral intake Etiology: inability to eat       Nutrition Problem: Inadequate oral intake Etiology: inability to eat     Signs/Symptoms: NPO status    Interventions: Ensure Enlive (each supplement provides 350kcal and 20 grams of protein), Prostat, TPN  Estimated body mass index is 49.83 kg/m as calculated from the following:   Height as of this encounter: 5\' 7"  (1.702 m).   Weight as of this encounter: 144.3 kg.  DVT prophylaxis:eliquis Code Status:FULL Disposition Plan:TBD  Consultants:  Cardiology  General Surgery   Subjective Sitting in bed, eating breakfast, TPN running,  Objective: Vitals:  01/12/19 0445 01/12/19 0500 01/12/19 0800  01/12/19 1144  BP: 98/65  (!) 93/56 (!) 102/57  Pulse: 73  72 80  Resp: 20  17 (!) 23  Temp: 98.7 F (37.1 C)  98 F (36.7 C) 97.8 F (36.6 C)  TempSrc: Oral  Oral Oral  SpO2: 99%  94% 96%  Weight:  (!) 144.3 kg    Height:        Intake/Output Summary (Last 24 hours) at 01/12/2019 1217 Last data filed at 01/12/2019 1100 Gross per 24 hour  Intake 2968.31 ml  Output 2520 ml  Net 448.31 ml   Filed Weights   01/10/19 0411 01/11/19 0327 01/12/19 0500  Weight: (!) 147.4 kg (!) 144.8 kg (!) 144.3 kg    Examination:  General exam: Appears calm and comfortable  Respiratory system: Clear to auscultation. Respiratory effort normal. Cardiovascular system: S1 & S2 heard, RRR. No JVD, murmurs, rubs, gallops or clicks. No pedal edema. Gastrointestinal system: Abdomen is nondistended, soft and nontender. No organomegaly or masses felt. Normal bowel sounds heard.  Dehisced abdominal wound covered with dressing drains in place. Central nervous system: Alert and oriented. No focal neurological deficits. Extremities: Symmetric 5 x 5 power. Skin: No rashes, lesions or ulcers Psychiatry: Judgement and insight appear normal. Mood & affect appropriate.     Data Reviewed: I have personally reviewed following labs and imaging studies  CBC: Recent Labs  Lab 01/07/19 1432 01/08/19 0505 01/09/19 0500 01/09/19 1423 01/10/19 0441 01/10/19 1313 01/11/19 0500 01/12/19 0500  WBC 23.4* 21.8* 19.4*  --  15.7* 16.6* 15.3* 12.8*  NEUTROABS 18.9* 16.8*  --   --  13.0*  --   --   --   HGB 9.3* 8.8* 6.5* 9.5* 9.5* 9.9* 9.3* 9.0*  HCT 29.2* 27.2* 20.3* 30.1* 30.0* 30.8* 29.4* 28.7*  MCV 89.6 89.2 90.2  --  89.8 89.8 90.7 91.7  PLT 301 287 318  --  276 269 254 782   Basic Metabolic Panel: Recent Labs  Lab 01/06/19 0401 01/07/19 1432 01/08/19 0505 01/09/19 0500 01/10/19 0441 01/10/19 0442 01/11/19 0500  NA 141  --  136 132*  --  134* 136  K 3.9  --  4.3 4.4  --  5.0 4.9  CL 102  --  98 96*   --  99 102  CO2 28  --  29 25  --  25 26  GLUCOSE 147*  --  180* 261*  --  196* 150*  BUN 22*  --  25* 23*  --  20 19  CREATININE 1.35*  --  1.45* 1.23  --  1.34* 1.20  CALCIUM 7.9*  --  7.1* 7.1*  --  7.6* 8.0*  MG 2.0 2.2 2.3 2.2 2.3  --  2.1  PHOS 4.4  --   --  3.3  --  3.4  --    GFR: Estimated Creatinine Clearance: 98.1 mL/min (by C-G formula based on SCr of 1.2 mg/dL). Liver Function Tests: Recent Labs  Lab 01/06/19 0401 01/08/19 0505 01/09/19 0500 01/10/19 0442 01/11/19 0500  AST 31 21  --   --  22  ALT 20 15  --   --  14  ALKPHOS 49 44  --   --  59  BILITOT 0.6 0.2*  --   --  0.4  PROT 5.6* 5.8*  --   --  6.4*  ALBUMIN 1.2* 1.2* 1.1* 1.2* 1.3*   No results for input(s): LIPASE, AMYLASE in the last 168  hours. No results for input(s): AMMONIA in the last 168 hours. Coagulation Profile: No results for input(s): INR, PROTIME in the last 168 hours. Cardiac Enzymes: No results for input(s): CKTOTAL, CKMB, CKMBINDEX, TROPONINI in the last 168 hours. BNP (last 3 results) No results for input(s): PROBNP in the last 8760 hours. HbA1C: No results for input(s): HGBA1C in the last 72 hours. CBG: Recent Labs  Lab 01/11/19 2025 01/11/19 2340 01/12/19 0441 01/12/19 0738 01/12/19 1124  GLUCAP 142* 152* 150* 139* 189*   Lipid Profile: Recent Labs    01/10/19 0441  TRIG 110   Thyroid Function Tests: No results for input(s): TSH, T4TOTAL, FREET4, T3FREE, THYROIDAB in the last 72 hours. Anemia Panel: No results for input(s): VITAMINB12, FOLATE, FERRITIN, TIBC, IRON, RETICCTPCT in the last 72 hours. Sepsis Labs: No results for input(s): PROCALCITON, LATICACIDVEN in the last 168 hours.  Recent Results (from the past 240 hour(s))  Aerobic/Anaerobic Culture (surgical/deep wound)     Status: None   Collection Time: 01/04/19  4:00 PM   Specimen: Abscess  Result Value Ref Range Status   Specimen Description ABSCESS PERITONEAL  Final   Special Requests Normal  Final    Gram Stain   Final    MODERATE WBC PRESENT, PREDOMINANTLY PMN RARE GRAM POSITIVE RODS    Culture   Final    RARE ESCHERICHIA COLI FEW BACTEROIDES FRAGILIS BETA LACTAMASE POSITIVE Performed at [facility], [address] [place], [place] [address]    Report Status 01/09/2019 FINAL  Final   Organism ID, Bacteria ESCHERICHIA COLI  Final      Susceptibility   Escherichia coli - MIC*    AMPICILLIN <=2 SENSITIVE Sensitive     CEFAZOLIN <=4 SENSITIVE Sensitive     CEFEPIME <=1 SENSITIVE Sensitive     CEFTAZIDIME <=1 SENSITIVE Sensitive     CEFTRIAXONE <=1 SENSITIVE Sensitive     CIPROFLOXACIN <=0.25 SENSITIVE Sensitive     GENTAMICIN <=1 SENSITIVE Sensitive     IMIPENEM <=0.25 SENSITIVE Sensitive     TRIMETH/SULFA <=20 SENSITIVE Sensitive     AMPICILLIN/SULBACTAM <=2 SENSITIVE Sensitive     PIP/TAZO <=4 SENSITIVE Sensitive     * RARE ESCHERICHIA COLI         Radiology Studies: No results found.      Scheduled Meds: . acetaminophen  650 mg Oral Q6H  . amiodarone  200 mg Oral Daily  . apixaban  5 mg Oral BID  . chlorhexidine  15 mL Mouth Rinse BID  . Chlorhexidine Gluconate Cloth  6 each Topical Daily  . [START ON 01/13/2019] digoxin  0.125 mg Oral Daily  . feeding supplement (ENSURE ENLIVE)  237 mL Oral TID BM  . feeding supplement (PRO-STAT SUGAR FREE 64)  30 mL Oral TID BM  . insulin aspart  0-20 Units Subcutaneous TID WC  . insulin aspart  0-5 Units Subcutaneous QHS  . mouth rinse  15 mL Mouth Rinse q12n4p  . methocarbamol  500 mg Oral TID  . multivitamin with minerals  1 tablet Oral Daily  . pantoprazole  40 mg Oral Q1200  . sodium chloride flush  10-40 mL Intracatheter Q12H  . sodium chloride flush  5 mL Intracatheter Q8H   Continuous Infusions: . sodium chloride 10 mL/hr at 01/09/19 1800  . sodium chloride    . TPN ADULT (ION) 20 mL/hr at 01/12/19 1100     LOS: 23 days     Alwyn RenElizabeth G Korby Ratay, MD [facility]  If 7PM-7AM,  please  contact night-coverage [email or web address] Password [email or web address] 01/12/2019, 12:17 PM

## 2019-01-12 NOTE — Progress Notes (Signed)
Calorie Count Note  48 hour calorie count ordered. Day 2 results.  Diet: Heart Healthy/ CHO modified Supplements:  -Ensure Enlive po TID, each supplement provides 350 kcal and 20 grams of protein -Prostat liquid protein PO 30 ml BID with meals, each supplement provides 100 kcal, 15 grams protein.  12/29: Breakfast: 210 kcals, 9g protein Lunch: 370 kcals, 23g protein Dinner: 200 kcals, 6g protein Supplements: 700 kcals, 40g protein (2 Ensures)  Total intake: 1480 kcal (67% of minimum estimated needs)  78g protein (60% of minimum estimated needs)  **Pt did consume 75% of breakfast this morning 12/30, providing ~350 kcals and 9g protein  Nutrition Dx: Inadequate oral intake related to inability to eat as evidenced by NPO status.  -Improved  Goal: Pt to meet >/= 90% of their estimated nutrition needs   Intervention:  -D/c TPN -may stimulate appetite for meal intakes -Continue Ensure Enlive po TID -Prostat liquid protein PO 30 ml BID with meals  Clayton Bibles, MS, RD, LDN Inpatient Clinical Dietitian Pager: [phone] After Hours Pager: [phone]

## 2019-01-12 NOTE — Progress Notes (Signed)
Inpatient Rehabilitation Admissions Coordinator  I met with patient at bedside to discuss his wound care needs and rehab venue options. His 53 year old daughter lives with him, works 8 until 5 pm and he states would not be able to do his wound care. He has no other caregiver support. Two to three weeks at [facility] CIR would not change his wound care needs and his insurance will not approve both CIR admit and then d/c to SNF rehab. I recommend SNF rehab and patient is in agreement. I have notified SW, Bridgett, and we will sign off at this time.  Danne Baxter, RN, MSN Rehab Admissions Coordinator [phone] 01/12/2019 11:24 AM

## 2019-01-12 NOTE — Progress Notes (Signed)
PHARMACY - TOTAL PARENTERAL NUTRITION CONSULT NOTE  Indication: massive bowel resection  Patient Measurements: Height: 5\' 7"  (170.2 cm) Weight: (!) 318 lb 2 oz (144.3 kg) IBW/kg (Calculated) : 66.1 TPN AdjBW (KG): 86.1 Body mass index is 49.83 kg/m.  Assessment:  42 yom presented to the hospital with abdominal pain found to be acute diverticulitis. He was treated conservatively but without improvement. He was taken to the OR 12/15 and found to have a perforated cecum with leakage of stool throughout the abdominal cavity. He remains on the ventilator with vasopressors. Patient has been NPO or clear liquids since admission so he may be at risk for refeeding syndrome.   Glucose / Insulin: A1c 6.3% - CBGs well controlled.  Required 18 units SSI last 24 hrs + 20 units insulin in TPN Electrolytes: all WNL (K high normal) Renal: SCr down 1.2, BUN normalized LFTs / TGs: LFTs / tbili / TG WNL Prealbumin / albumin: prealbumin improved to 12.5, albumin 1.2 Intake / Output; MIVF: drain O/P 49mL, ileostomy O/P 628ml, UOP 0.6 mL/kg/hr, NS at 75/hr GI Imaging: 12/9 Abd xray: Worsening gaseous distension of the proximal colon and now involving much of the small bowel 12/21 CT Abd: Large R-side intra-abdominal abscess, mesenteric edema and few mesenteric fluid collections; probable mild small-bowel ileus; small bilateral pleural effusions 12/14 Abd xray: Persistent bowel dilatation. No free air demonstrable 12/14 CT abd: Worsening distal large-bowel obstruction at the level of the descending colon correlating with an approximately 5 cm in length segment of irregular annular wall thickening with underlying colonic diverticulosis and mild pericolonic fat stranding 12/21 CT Abd: Large R-side intra-abdominal abscess, mesenteric edema and few mesenteric fluid collections; probable mild small-bowel ileus; small bilateral pleural effusions Surgeries / Procedures:  12/10 Flexible sigmoidoscopy 12/15 Exlap,  subtotal colectomy and end ileostomy 12/22 IR drainage of right peritoneal abscess  Central access: PICC 12/28/18 TPN start date: 12/29/18  Nutritional Goals (per RD rec on 12/22): KCal: 2200-2400, Protein: 130-150, Fluid: >/=1.8 L/day  Current Nutrition:  TPN  Heart healthy/carb modified diet - consume up to 75% of meals per patient Ensure Enlive TID - 2 charted given Prostat TID - none charted given  48-hr calorie count started 12/28: meeting ~60% of needs  Plan:  D/C TPN per Surgery:  reduce TPN to 20 ml/hr, then stop at 1800 D/C TPN labs and nursing care orders Change digoxin to PO NS at 75 ml/hr per MD Change resistant SSI to ACHS.  May need insulin as he was getting 20 units in TPN - adjustment per MD.  Remigio Eisenmenger D. Mina Marble, PharmD, BCPS, [place] 01/12/2019, 9:25 AM

## 2019-01-12 NOTE — NC FL2 (Signed)
[place] LEVEL OF CARE SCREENING TOOL     IDENTIFICATION  Patient Name: Roberto [name] Birthdate: 06-16-1965 Sex: male Admission Date (Current Location): 12/19/2018  [facility] and [place] Number:  Herbalist and Address:  [facility], [place] [address] [place], [place] [address]      Provider Number: [id number]  Attending Physician Name and Address:  Georgette Shell, MD  Relative Name and Phone Number:       Current Level of Care: Hospital Recommended Level of Care: [place] Prior Approval Number:    Date Approved/Denied:   PASRR Number: [id number]  Discharge Plan: SNF    Current Diagnoses: Patient Active Problem List   Diagnosis Date Noted  . Acute on chronic systolic heart failure ([place])   . Respiratory failure ([place])   . Bowel obstruction ([place])   . Pre-operative cardiovascular examination   . Acute diverticulitis 12/20/2018  . 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 History of DVT (deep vein thrombosis) 09/04/2018  . PAF (paroxysmal atrial fibrillation) ([place]) 09/04/2018  . Rash 05/06/2018  . Elevated uric acid in blood 05/06/2018  . Atypical atrial flutter ([place]) 03/17/2017  . Chronic pain of right knee 03/13/2017  . Typical atrial flutter s/p catheter ablation   . Varicose veins of legs 12/17/2015  . Encounter for health maintenance examination in adult 12/17/2015  . Special screening for malignant neoplasms, colon 12/17/2015  . Vaccine counseling 12/17/2015  . Screening for prostate cancer 12/17/2015  . Knee deformity, acquired, right 12/17/2015  . Gait disturbance 12/17/2015  . Osteoarthritis of right knee 12/17/2015  . Varicose veins of lower extremities with ulcer ([place]) 09/18/2015  . Need for prophylactic vaccination and inoculation against influenza 09/18/2015  . Tinea versicolor 06/15/2015  . Impaired fasting blood sugar 12/28/2014  . Long term current use of  anticoagulant therapy 12/28/2014  . Erectile dysfunction 02/19/2012  . Chronic systolic heart failure ([place]) 12/29/2011  . Hypokalemia 12/24/2011  . Lupus anticoagulant positive 12/22/2011  . Nonischemic cardiomyopathy ([place]) 12/16/2011  . Paroxysmal SVT (supraventricular tachycardia) ([place]) 12/16/2011  . History of venous thromboembolism 12/16/2011  . Venous insufficiency 11/04/2011  . Obesity, morbid ([place]) 06/02/2008    Orientation RESPIRATION BLADDER Height & Weight     Self, Time, Place, Situation  O2([place] 2L) Incontinent Weight: (!) 318 lb 2 oz (144.3 kg) Height:  5\' 7"  (170.2 cm)  BEHAVIORAL SYMPTOMS/MOOD NEUROLOGICAL BOWEL NUTRITION STATUS      Continent Diet(heart healthy/carb modified)  AMBULATORY STATUS COMMUNICATION OF NEEDS Skin   Limited Assist Verbally Skin abrasions(closed wound on abdomen: Abdominal binder;ABD;Moist to dry;Non adherent)                       Personal Care Assistance Level of Assistance  Dressing, Feeding, Bathing Bathing Assistance: Limited assistance Feeding assistance: Independent Dressing Assistance: Limited assistance     Functional Limitations Info  Sight, Hearing, Speech Sight Info: Adequate Hearing Info: Adequate Speech Info: Adequate    SPECIAL CARE FACTORS FREQUENCY  PT (By licensed PT), OT (By licensed OT)     PT Frequency: 5x OT Frequency: 5x            Contractures Contractures Info: Not present    Additional Factors Info  Code Status, Allergies Code Status Info: Full Code Allergies Info: Shrimp (Shellfish Allergy)           Current Medications (01/12/2019):  This is the current hospital active  medication list Current Facility-Administered Medications  Medication Dose Route Frequency Provider Last Rate Last Admin  . 0.9 %  sodium chloride infusion   Intravenous PRN Rometta Emery, MD 10 mL/hr at 01/09/19 1800 Rate Verify at 01/09/19 1800  . 0.9 %  sodium chloride infusion  250 mL Intravenous Continuous  Dorthea Cove, NP      . acetaminophen (TYLENOL) tablet 650 mg  650 mg Oral Q6H [name], Kelly A, PA-C   650 mg at 01/12/19 1007  . alum & mag hydroxide-simeth (MAALOX/MYLANTA) 200-200-20 MG/5ML suspension 30 mL  30 mL Oral Q4H PRN Karl Ito, MD   30 mL at 01/02/19 0218  . amiodarone (PACERONE) tablet 200 mg  200 mg Oral Daily Jake Bathe, MD   200 mg at 01/12/19 1008  . apixaban (ELIQUIS) tablet 5 mg  5 mg Oral BID Gardner Candle, RPH   5 mg at 01/12/19 1008  . chlorhexidine (PERIDEX) 0.12 % solution 15 mL  15 mL Mouth Rinse BID Luciano Cutter, MD   15 mL at 01/12/19 1008  . Chlorhexidine Gluconate Cloth 2 % PADS 6 each  6 each Topical Daily Emelia Loron, MD   6 each at 01/10/19 1800  . [START ON 01/13/2019] digoxin (LANOXIN) tablet 0.125 mg  0.125 mg Oral Daily Gerilyn Nestle, [facility]      . feeding supplement (ENSURE ENLIVE) (ENSURE ENLIVE) liquid 237 mL  237 mL Oral TID BM [name], Kelly A, PA-C   237 mL at 01/12/19 1009  . feeding supplement (PRO-STAT SUGAR FREE 64) liquid 30 mL  30 mL Oral TID BM [name], Kelly A, PA-C   30 mL at 01/10/19 0831  . insulin aspart (novoLOG) injection 0-20 Units  0-20 Units Subcutaneous TID WC Gerilyn Nestle, RPH   4 Units at 01/12/19 1148  . insulin aspart (novoLOG) injection 0-5 Units  0-5 Units Subcutaneous QHS Gerilyn Nestle, RPH      . MEDLINE mouth rinse  15 mL Mouth Rinse q12n4p Luciano Cutter, MD   15 mL at 01/12/19 1149  . methocarbamol (ROBAXIN) tablet 500 mg  500 mg Oral TID [name], Kelly A, PA-C   500 mg at 01/12/19 1008  . morphine 2 MG/ML injection 2-4 mg  2-4 mg Intravenous Q2H PRN Manus Rudd, MD   2 mg at 01/12/19 0422  . multivitamin with minerals tablet 1 tablet  1 tablet Oral Daily Gerilyn Nestle, [facility]   1 tablet at 01/12/19 1007  . ondansetron (ZOFRAN) injection 4-8 mg  4-8 mg Intravenous Q8H PRN Oretha Milch, MD   8 mg at 01/06/19 2105  . oxyCODONE (Oxy IR/ROXICODONE) immediate release tablet 5-10 mg  5-10 mg Oral Q4H PRN  [name], Kelly A, PA-C   5 mg at 01/12/19 1007  . pantoprazole (PROTONIX) EC tablet 40 mg  40 mg Oral Q1200 Alwyn Ren, MD   40 mg at 01/12/19 1148  . sodium chloride flush (NS) 0.9 % injection 10-40 mL  10-40 mL Intracatheter Q12H Leslye Peer, MD   10 mL at 01/12/19 1009  . sodium chloride flush (NS) 0.9 % injection 10-40 mL  10-40 mL Intracatheter PRN Leslye Peer, MD   10 mL at 12/29/18 1834  . sodium chloride flush (NS) 0.9 % injection 5 mL  5 mL Intracatheter Q8H Irish Lack, MD   5 mL at 01/12/19 0431  . TPN ADULT (ION)   Intravenous Continuous TPN Gerilyn Nestle, [facility]  20 mL/hr at 01/12/19 1100 Rate Verify at 01/12/19 1100     Discharge Medications: Please see discharge summary for a list of discharge medications.  Relevant Imaging Results:  Relevant Lab Results:   Additional Information SSN:[id number]  Maree Krabbe, LCSW

## 2019-01-12 NOTE — Progress Notes (Signed)
Physical Therapy Treatment Patient Details Name: Roberto [name] MRN: [id number] DOB: July 08, 1965 Today's Date: 01/12/2019    History of Present Illness Pt is a 53 y.o. M with history of NICM, EF 25-30%, PSVT, PAF, chronic systolic HF with AICD, DVT/PE/LV thrombus , on chronic anticoagulation who was admitted 12/20/2018 for acute diverticulitis. He was initially treated conservatively, but had no real improvement and was taken to the OR 12/28/2018.Pt was found to have a perforated cecum with leakage of stool throughout abdominal cavity. 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 He was brought from the OR to NICU intubated and on pressors. Extubated 12/16.    PT Comments    Patient received in bed, motivated and willing to work with therapy today. Requested to try getting off of R side of bed, and able to perform bed mobility with Mod-MaxAx1, then required ModAx2 and RW with extended time/increased effort to come to full standing position; able to take sidesteps at EOB with Min-ModAx2 and RW but very fatigued afterwards. VSS on room air but with DOE 2/4. He was returned to bed with MaxAx2 due to fatigue and positioned to comfort with all needs met and bed alarm active this afternoon. Per chart review, CIR has been denied- changed recommendations to SNF accordingly.     Follow Up Recommendations  SNF     Equipment Recommendations  Rolling walker with 5" wheels;3in1 (PT)    Recommendations for Other Services       Precautions / Restrictions Precautions Precautions: Fall;Other (comment) Precaution Comments: colostomy, JP drains, needs abdominal binder Restrictions Weight Bearing Restrictions: No    Mobility  Bed Mobility Overal bed mobility: Needs Assistance Bed Mobility: Rolling;Sidelying to Sit;Sit to Sidelying Rolling: Mod assist Sidelying to sit: Max assist     Sit to sidelying: Max assist;+2 for physical assistance General bed mobility comments: went off of R side of bed,  ModA for rolling with use of rail today, only needed MaxAx1 to get to upright today but +2 for return to supine due to fatigue  Transfers Overall transfer level: Needs assistance Equipment used: Rolling walker (2 wheeled) Transfers: Sit to/from Stand Sit to Stand: Mod assist;+2 physical assistance         General transfer comment: ModAx2, extended time and increased effort, cues for hand placement  Ambulation/Gait Ambulation/Gait assistance: Min assist;+2 physical assistance;Mod assist Gait Distance (Feet): 2 Feet Assistive device: Rolling walker (2 wheeled) Gait Pattern/deviations: Step-to pattern;Decreased step length - right;Decreased step length - left;Trunk flexed Gait velocity: decreased   General Gait Details: able to take side steps up EOB with considerable time and effort, 3/4 DOE but VSS on room air, Min-ModAx2 for safety and balance with RW   Stairs             Wheelchair Mobility    Modified Rankin (Stroke Patients Only)       Balance Overall balance assessment: Needs assistance Sitting-balance support: Bilateral upper extremity supported;Feet supported Sitting balance-Leahy Scale: Fair Sitting balance - Comments: Min guard-MinA for balance   Standing balance support: Bilateral upper extremity supported Standing balance-Leahy Scale: Poor Standing balance comment: bil UE reliance on RW, external support                            Cognition Arousal/Alertness: Awake/alert Behavior During Therapy: WFL for tasks assessed/performed;Anxious Overall Cognitive Status: Within Functional Limits for tasks assessed  General Comments: appears somewhat anxious with mobility but motivated      Exercises      General Comments General comments (skin integrity, edema, etc.): VSS on room air, patient very fatigued at EOS      Pertinent Vitals/Pain Pain Assessment: Faces Faces Pain Scale: Hurts little  more Pain Location: abdominal pain Pain Descriptors / Indicators: Aching;Throbbing;Sore Pain Intervention(s): Limited activity within patient's tolerance;Monitored during session;Premedicated before session    Home Living                      Prior Function            PT Goals (current goals can now be found in the care plan section) Acute Rehab PT Goals Patient Stated Goal: to get OOB PT Goal Formulation: With patient Time For Goal Achievement: 01/16/19 Potential to Achieve Goals: Good Progress towards PT goals: Progressing toward goals    Frequency    Min 3X/week      PT Plan Discharge plan needs to be updated    Co-evaluation              AM-PAC PT "6 Clicks" Mobility   Outcome Measure  Help needed turning from your back to your side while in a flat bed without using bedrails?: A Lot Help needed moving from lying on your back to sitting on the side of a flat bed without using bedrails?: A Lot Help needed moving to and from a bed to a chair (including a wheelchair)?: A Lot Help needed standing up from a chair using your arms (e.g., wheelchair or bedside chair)?: A Lot Help needed to walk in hospital room?: A Lot Help needed climbing 3-5 steps with a railing? : Total 6 Click Score: 11    End of Session Equipment Utilized During Treatment: Other (comment)(abdominal binder) Activity Tolerance: Patient tolerated treatment well;Patient limited by fatigue Patient left: in bed;with call bell/phone within reach;with bed alarm set Nurse Communication: Mobility status PT Visit Diagnosis: Other abnormalities of gait and mobility (R26.89);Pain;Muscle weakness (generalized) (M62.81) Pain - Right/Left: Right Pain - part of body: (abdomen)     Time: 5400-8676 PT Time Calculation (min) (ACUTE ONLY): 23 min  Charges:  $Therapeutic Activity: 23-37 mins                     Windell Norfolk, DPT, PN1   Supplemental Physical Therapist [place]    Pager  [phone] Acute Rehab Office [phone]

## 2019-01-12 NOTE — Progress Notes (Signed)
15 Days Post-Op    FM:[id number] pain  Subjective: Tolerating diet well, ate most of his breakfast, ileostomy working well.  Drains are both serous.  Wound is stable.    Objective: Vital signs in last 24 hours: Temp:  [98 F (36.7 C)-99.6 F (37.6 C)] 98 F (36.7 C) (12/30 0800) Pulse Rate:  [72-79] 72 (12/30 0800) Resp:  [17-26] 17 (12/30 0800) BP: (93-112)/(53-67) 93/56 (12/30 0800) SpO2:  [90 %-99 %] 94 % (12/30 0800) Weight:  [144.3 kg] 144.3 kg (12/30 0500) Last BM Date: 01/12/19 1160 PO recorded 1526 IV 2200 urine Drains 45 Stool 675 Afebrile, VSS WBC 12.8 H/H 9/28.7 Intake/Output from previous day: 12/29 0701 - 12/30 0700 In: 2686.1 [P.O.:1160; I.V.:1526.1] Out: 2920 [Urine:2200; Drains:45; Stool:675] Intake/Output this shift: Total I/O In: 229.9 [I.V.:229.9] Out: -   General appearance: alert, cooperative and no distress GI: large open abdominal wound is stable, cleaning up, drains are both serous fluid  Lab Results:  Recent Labs    01/11/19 0500 01/12/19 0500  WBC 15.3* 12.8*  HGB 9.3* 9.0*  HCT 29.4* 28.7*  PLT 254 274    BMET Recent Labs    01/10/19 0442 01/11/19 0500  NA 134* 136  K 5.0 4.9  CL 99 102  CO2 25 26  GLUCOSE 196* 150*  BUN 20 19  CREATININE 1.34* 1.20  CALCIUM 7.6* 8.0*   PT/INR No results for input(s): LABPROT, INR in the last 72 hours.  Recent Labs  Lab 01/06/19 0401 01/08/19 0505 01/09/19 0500 01/10/19 0442 01/11/19 0500  AST 31 21  --   --  22  ALT 20 15  --   --  14  ALKPHOS 49 44  --   --  59  BILITOT 0.6 0.2*  --   --  0.4  PROT 5.6* 5.8*  --   --  6.4*  ALBUMIN 1.2* 1.2* 1.1* 1.2* 1.3*     Lipase     Component Value Date/Time   LIPASE 27 12/19/2018 2038     Medications: . acetaminophen  650 mg Oral Q6H  . amiodarone  200 mg Oral Daily  . apixaban  5 mg Oral BID  . chlorhexidine  15 mL Mouth Rinse BID  . Chlorhexidine Gluconate Cloth  6 each Topical Daily  . digoxin  0.125 mg Intravenous  Daily  . feeding supplement (ENSURE ENLIVE)  237 mL Oral TID BM  . feeding supplement (PRO-STAT SUGAR FREE 64)  30 mL Oral TID BM  . insulin aspart  0-20 Units Subcutaneous Q4H  . mouth rinse  15 mL Mouth Rinse q12n4p  . methocarbamol  500 mg Oral TID  . multivitamin with minerals  1 tablet Oral Daily  . pantoprazole  40 mg Oral Q1200  . sodium chloride flush  10-40 mL Intracatheter Q12H  . sodium chloride flush  5 mL Intracatheter Q8H    Assessment/Plan CHF EF 25-30% Atrial fibrillation Hx DVT/PE LV mural thrombus Gross anticoagulant Morbid obesityBMI 50.63 AKI onCKD Dehydration R elbow pain  Severe malnutrition prealbumin: 6.1 >> 12.5(12/29)  Left colonic stricture of unknown etiology; with perforated cecum POD#14STC/ileostomy fordiverticular stricture of left colon and perforated R colon12/15/2020, Dr. Emelia Loron - pathwithout evidence of malignancy -s/p drain placement 12/22 - cxsE coli pansensitive,abx stopped 12/27. Plan repeat CT once output minimal -ileostomy functioning, tolerating a diet.Prealbumin improving but still low (12.5). Calorie count ordered today prior to discontinuing TPN completely - wound dehisced, monitor closely. BID wet to dry dressing changes - abdominal  binder - plan to d/c bedrest today, he needs to have binder on to be up.   FEN:1/2 TPN,HH/CM diet, ensure, prostat KGM:[id number]  >> 12/28 ID: IV Zosyn 12/6>>12/27, IV diflucan 12/15>>12/27 Follow up:  Dr. Donne Hazel  Plan:  Ostomy is working and he is tolerating diet, will DC TPN and continue local wound care.  Appreciate Dietary's assistance.  He got to the bedside yesterday, needs ongoing wound care, PT/OT.        LOS: 23 days    Zakiya Sporrer 01/12/2019 Please see Amion

## 2019-01-13 LAB — COMPREHENSIVE METABOLIC PANEL
ALT: 18 U/L (ref 0–44)
AST: 23 U/L (ref 15–41)
Albumin: 1.4 g/dL — ABNORMAL LOW (ref 3.5–5.0)
Alkaline Phosphatase: 65 U/L (ref 38–126)
Anion gap: 8 (ref 5–15)
BUN: 19 mg/dL (ref 6–20)
CO2: 25 mmol/L (ref 22–32)
Calcium: 8 mg/dL — ABNORMAL LOW (ref 8.9–10.3)
Chloride: 101 mmol/L (ref 98–111)
Creatinine, Ser: 1.09 mg/dL (ref 0.61–1.24)
GFR calc Af Amer: >60 mL/min (ref >60)
GFR calc non Af Amer: >60 mL/min (ref >60)
Glucose, Bld: 140 mg/dL — ABNORMAL HIGH (ref 70–99)
Potassium: 4.7 mmol/L (ref 3.5–5.1)
Sodium: 134 mmol/L — ABNORMAL LOW (ref 135–145)
Total Bilirubin: 0.5 mg/dL (ref 0.3–1.2)
Total Protein: 6.3 g/dL — ABNORMAL LOW (ref 6.5–8.1)

## 2019-01-13 LAB — GLUCOSE, CAPILLARY
Glucose-Capillary: 111 mg/dL — ABNORMAL HIGH (ref 70–99)
Glucose-Capillary: 147 mg/dL — ABNORMAL HIGH (ref 70–99)
Glucose-Capillary: 140 mg/dL — ABNORMAL HIGH (ref 70–99)
Glucose-Capillary: 159 mg/dL — ABNORMAL HIGH (ref 70–99)

## 2019-01-13 LAB — CBC
HCT: 30.3 % — ABNORMAL LOW (ref 39.0–52.0)
Hemoglobin: 9.7 g/dL — ABNORMAL LOW (ref 13.0–17.0)
MCH: 29.2 pg (ref 26.0–34.0)
MCHC: 32 g/dL (ref 30.0–36.0)
MCV: 91.3 fL (ref 80.0–100.0)
Platelets: 305 10*3/uL (ref 150–400)
RBC: 3.32 MIL/uL — ABNORMAL LOW (ref 4.22–5.81)
RDW: 15.5 % (ref 11.5–15.5)
WBC: 11 10*3/uL — ABNORMAL HIGH (ref 4.0–10.5)
nRBC: 0 % (ref 0.0–0.2)

## 2019-01-13 LAB — PHOSPHORUS: Phosphorus: 4.2 mg/dL (ref 2.5–4.6)

## 2019-01-13 LAB — MAGNESIUM: Magnesium: 1.8 mg/dL (ref 1.7–2.4)

## 2019-01-13 NOTE — Progress Notes (Signed)
Physical Therapy Treatment Patient Details Name: Roberto [name] MRN: [id number] DOB: 05-Dec-1965 Today's Date: 01/13/2019    History of Present Illness Pt is a 53 y.o. M with history of NICM, EF 25-30%, PSVT, PAF, chronic systolic HF with AICD, DVT/PE/LV thrombus , on chronic anticoagulation who was admitted 12/20/2018 for acute diverticulitis. He was initially treated conservatively, but had no real improvement and was taken to the OR 12/28/2018.Pt was found to have a perforated cecum with leakage of stool throughout abdominal cavity. 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 He was brought from the OR to NICU intubated and on pressors. Extubated 12/16.    PT Comments    Patient received in bed, premedicated by RN and motivated to participate in PT today. Continues to improve, able to roll to R side with MinA, pushed up to sitting at EOB from sidelying also with MinA and much less fatigued. Did require MaxAX2 for functional transfers with bari-RW, once up able to take pivotal steps to recliner with min guardx2 and bari-RW however. He was positioned to comfort in the chair with all needs met this morning, lift pad in chair for nursing staff to use for back to bed. RN and NT educated regarding plan to try to sit up for about 60 minutes today (first time up) and mobility for back to bed.     Follow Up Recommendations  SNF     Equipment Recommendations  Rolling walker with 5" wheels;3in1 (PT)    Recommendations for Other Services       Precautions / Restrictions Precautions Precautions: Fall;Other (comment) Precaution Comments: colostomy, JP drains, needs abdominal binder Restrictions Weight Bearing Restrictions: No    Mobility  Bed Mobility Overal bed mobility: Needs Assistance Bed Mobility: Rolling;Sidelying to Sit Rolling: Min assist Sidelying to sit: Min assist       General bed mobility comments: continues to do well getting off of R side of bed, required less assist  today and had less fatigue with movement  Transfers Overall transfer level: Needs assistance Equipment used: Rolling walker (2 wheeled) Transfers: Sit to/from Stand Sit to Stand: Max assist;+2 physical assistance Stand pivot transfers: Min guard;+2 physical assistance       General transfer comment: MaxAx2 to boost to full standing position from regular height bed, once up and balanced able to take pivotal steps to chair with min guard x2 and assist for safety  Ambulation/Gait             General Gait Details: deferred- able to take pivotal steps to chair   Stairs             Wheelchair Mobility    Modified Rankin (Stroke Patients Only)       Balance Overall balance assessment: Needs assistance Sitting-balance support: Bilateral upper extremity supported;Feet supported Sitting balance-Leahy Scale: Fair Sitting balance - Comments: Min guard-MinA for balance   Standing balance support: Bilateral upper extremity supported Standing balance-Leahy Scale: Fair Standing balance comment: bil UE reliance on RW, external support                            Cognition Arousal/Alertness: Awake/alert Behavior During Therapy: WFL for tasks assessed/performed;Anxious Overall Cognitive Status: Within Functional Limits for tasks assessed                                 General Comments: appears somewhat anxious with  mobility but motivated      Exercises      General Comments General comments (skin integrity, edema, etc.): VSS, improved functional activity tolerance today      Pertinent Vitals/Pain Pain Assessment: Faces Faces Pain Scale: Hurts a little bit Pain Location: abdominal pain Pain Descriptors / Indicators: Aching;Throbbing;Sore Pain Intervention(s): Limited activity within patient's tolerance;Monitored during session;Premedicated before session;Repositioned    Home Living                      Prior Function             PT Goals (current goals can now be found in the care plan section) Acute Rehab PT Goals Patient Stated Goal: to get OOB PT Goal Formulation: With patient Time For Goal Achievement: 01/16/19 Potential to Achieve Goals: Good Progress towards PT goals: Progressing toward goals    Frequency    Min 3X/week      PT Plan Current plan remains appropriate    Co-evaluation              AM-PAC PT "6 Clicks" Mobility   Outcome Measure  Help needed turning from your back to your side while in a flat bed without using bedrails?: A Lot Help needed moving from lying on your back to sitting on the side of a flat bed without using bedrails?: A Lot Help needed moving to and from a bed to a chair (including a wheelchair)?: A Little Help needed standing up from a chair using your arms (e.g., wheelchair or bedside chair)?: A Lot Help needed to walk in hospital room?: A Lot Help needed climbing 3-5 steps with a railing? : Total 6 Click Score: 12    End of Session Equipment Utilized During Treatment: Other (comment)(abdominal binder) Activity Tolerance: Patient tolerated treatment well Patient left: in chair;with call bell/phone within reach Nurse Communication: Mobility status;Other (comment)(back to bed after lunch) PT Visit Diagnosis: Other abnormalities of gait and mobility (R26.89);Pain;Muscle weakness (generalized) (M62.81) Pain - Right/Left: Right Pain - part of body: (abdomen)     Time: 7915-0569 PT Time Calculation (min) (ACUTE ONLY): 26 min  Charges:  $Therapeutic Activity: 23-37 mins                     Windell Norfolk, DPT, PN1   Supplemental Physical Therapist [place]    Pager [phone] Acute Rehab Office [phone]

## 2019-01-13 NOTE — Progress Notes (Signed)
Progress Note  Patient Name: Roberto [name] Date of Encounter: 01/13/2019  Primary Cardiologist: Arvilla Meres, MD   Subjective   Having some abdominal pain today but otherwise ok. No increase in SOB and no CP.   Inpatient Medications    Scheduled Meds: . acetaminophen  650 mg Oral Q6H  . amiodarone  200 mg Oral Daily  . apixaban  5 mg Oral BID  . chlorhexidine  15 mL Mouth Rinse BID  . Chlorhexidine Gluconate Cloth  6 each Topical Daily  . digoxin  0.125 mg Oral Daily  . feeding supplement (ENSURE ENLIVE)  237 mL Oral TID BM  . feeding supplement (PRO-STAT SUGAR FREE 64)  30 mL Oral TID BM  . insulin aspart  0-20 Units Subcutaneous TID WC  . insulin aspart  0-5 Units Subcutaneous QHS  . mouth rinse  15 mL Mouth Rinse q12n4p  . methocarbamol  500 mg Oral TID  . multivitamin with minerals  1 tablet Oral Daily  . pantoprazole  40 mg Oral Q1200  . sodium chloride flush  10-40 mL Intracatheter Q12H  . sodium chloride flush  5 mL Intracatheter Q8H   Continuous Infusions: . sodium chloride 10 mL/hr at 01/09/19 1800  . sodium chloride     PRN Meds: sodium chloride, alum & mag hydroxide-simeth, morphine injection, ondansetron (ZOFRAN) IV, oxyCODONE, sodium chloride flush   Vital Signs    Vitals:   01/13/19 0110 01/13/19 0535 01/13/19 0855 01/13/19 0924  BP:  102/66 114/70   Pulse: 79 77 72 74  Resp: 20 (!) 28 (!) 24   Temp:  98.3 F (36.8 C) 98.1 F (36.7 C)   TempSrc:  Oral Oral   SpO2: 96% 97%    Weight:  (!) 145.3 kg    Height:        Intake/Output Summary (Last 24 hours) at 01/13/2019 1251 Last data filed at 01/13/2019 3086 Gross per 24 hour  Intake 1116.01 ml  Output 3055 ml  Net -1938.99 ml   Filed Weights   01/11/19 0327 01/12/19 0500 01/13/19 0535  Weight: (!) 144.8 kg (!) 144.3 kg (!) 145.3 kg    Telemetry    Normal sinus rhythm- Personally Reviewed   Physical Exam   Constitutional: No acute distress Eyes: sclera non-icteric, normal  conjunctiva and lids ENMT: normal dentition, moist mucous membranes Cardiovascular: regular rhythm, normal rate, no murmurs. S1 and S2 normal. Radial pulses normal bilaterally. No jugular venous distention.  Respiratory: clear to auscultation bilaterally GI : abdomen soft MSK: extremities warm, well perfused. Bilateral puffy edema.  NEURO: grossly nonfocal exam, moves all extremities. PSYCH: alert and oriented x 3, normal mood and affect.   Labs    Chemistry Recent Labs  Lab 01/08/19 0505 01/10/19 0442 01/11/19 0500 01/13/19 0500  NA 136 134* 136 134*  K 4.3 5.0 4.9 4.7  CL 98 99 102 101  CO2 29 25 26 25   GLUCOSE 180* 196* 150* 140*  BUN 25* 20 19 19   CREATININE 1.45* 1.34* 1.20 1.09  CALCIUM 7.1* 7.6* 8.0* 8.0*  PROT 5.8*  --  6.4* 6.3*  ALBUMIN 1.2* 1.2* 1.3* 1.4*  AST 21  --  22 23  ALT 15  --  14 18  ALKPHOS 44  --  59 65  BILITOT 0.2*  --  0.4 0.5  GFRNONAA 55* >60 >60 >60  GFRAA >60 >60 >60 >60  ANIONGAP 9 10 8 8      Hematology Recent Labs  Lab 01/11/19  0500 01/12/19 0500 01/13/19 0500  WBC 15.3* 12.8* 11.0*  RBC 3.24* 3.13* 3.32*  HGB 9.3* 9.0* 9.7*  HCT 29.4* 28.7* 30.3*  MCV 90.7 91.7 91.3  MCH 28.7 28.8 29.2  MCHC 31.6 31.4 32.0  RDW 15.8* 15.7* 15.5  PLT 254 274 305    Cardiac EnzymesNo results for input(s): TROPONINI in the last 168 hours. No results for input(s): TROPIPOC in the last 168 hours.   BNPNo results for input(s): BNP, PROBNP in the last 168 hours.   DDimer No results for input(s): DDIMER in the last 168 hours.   Radiology    No results found.  Cardiac Studies   Echocardiogram 07/20/2018 IMPRESSIONS  1. The left ventricle has severely reduced systolic function, with an ejection fraction of 25-30%. The cavity size was moderately dilated. Left ventricular diastolic Doppler parameters are consistent with pseudonormalization. Left ventricular diffuse  hypokinesis. 2. The right ventricle has normal systolic function. The cavity  was normal. There is no increase in right ventricular wall thickness. 3. Left atrial size was mildly dilated. 4. No evidence of mitral valve stenosis. Trivial mitral regurgitation. 5. The aortic valve is tricuspid. No stenosis of the aortic valve. 6. The aortic root is normal in size and structure. 7. The inferior vena cava was dilated in size with <50% respiratory variability. No complete TR doppler jet so unable to estimate PA systolic pressure.  Patient Profile     53 y.o. male with a hx of chronic diastolic HF,h/o ofLV thrombus, nonischemic cardiomyopathy (EF 25-30% 2013, EF 2020 25-30%), ICD placed 08/2018, PSVT (elected for medical therapy over RFA), DVT/PE, Paroxysmal afib on amiodarone, Aflutter ablation 11/2016, lupus, and morbid obesityinitially seen for preop evaluation in setting of abdominal pain/acute diverticulitis. Followed for hx of CHF.  Assessment & Plan    Acute diverticulitis/Colon Mass/ Sepsis Patient was admitted 12/7 for acute diverticulitis and was being treated medically but did not improve. 12/15 he was taken to the OR found to have perforated cecum and surgery found a mass. In the OR patient became hypotensive and required pressors. Patient was intubated and transferred to ICU for sepsis, place on IV abx. Patient was extubated 12/16. - Path from colon mass shows benign colonic mucosa with mild reactive changes, no active inflammation, no malignancy -off TPN - Off pressors, relatively soft blood pressures -Surgery note reviewed -s/p drainageof large abcess  Chronic systolic HF (LVEF 83-15%) - Followed by Advanced Heart Failure team - At baseline was on Entresto, spironolactone, and Torsemide. All held during admission - Digoxin was continued. Level0.6 on 12/25.  - Patient was started on IV Lasix 40 mg BID which was subsequently held on 01/07/2019 -Creatinine 1.09 - Weight down to 319 lbs (333 on admit)  - His weight at last AHF clinic a year ago  was 301lbs and with EP last month was 333lbs (felt to be euvolemic at that time) - Patient has mild puffy edema on exam, some of which appears chronic.  - follow I&Os and daily weights - continue to hold off on restarting Entrestobecause of low BP. Can consider as patient moves toward rehab.   Morbid obesity with BMI 54.  ICD in place  -Functioning well.  Paroxysmal Afib - Had Aflutter ablation 2018 - On amiodarone at home, was on IV amio, now on amiodarone 200 mg daily - Eliquis for stroke prevention>>was held for surgery and placed on heparin.  - Transitioned back to eliquis and hemoglobin stable.   Cardiology will follow intermittently as needed  to reassess medications and volume status.    Signed, Parke Poisson, MD  01/13/2019, 12:51 PM

## 2019-01-13 NOTE — Progress Notes (Signed)
PROGRESS NOTE    Roberto [name]  YYT:[id number] DOB: [birth year] DOA: 12/19/2018 PCP: [name], MD    Brief Narrative:Pt. With history of NICM, EF 25-30%, PSVT, PAF, chronic systolic HF with AICD, hx of DVT/PE/LV thrombus , on chronic anticoagulation ( bridged with heparin), was admitted 12/20/2018 for acute diverticulitis. He was initially treated conservatively, but had no real improvement and was taken to the OR 12/28/2018. Pt had Left colonic stricture of unknown etiology, and perforated cecum. Per surgery's note there was a mass in the mid descending colon. The right colon was ischemic and had perforated. There was visualization of leakage of a large amount of stool throughout his entire abdominal cavity. In OR patient became hypotensive, required pressors (Phenylephrine and Levo). Surgery felt the patient was not ready for extubation, and considering co morbidities and possible septic insult asked PCCM to admit to ICU manage care. Transferred to [facility] service on 01/03/2019 now off pressors  Assessment & Plan:   Principal Problem:   Acute diverticulitis Active Problems:   Obesity, morbid (HCC)   Nonischemic cardiomyopathy (HCC)   Paroxysmal SVT (supraventricular tachycardia) (HCC)   Chronic systolic heart failure (HCC)   Chronic pain of right knee   History of DVT (deep vein thrombosis)   PAF (paroxysmal atrial fibrillation) (HCC)   Pre-operative cardiovascular examination   Bowel obstruction (HCC)   Respiratory failure (HCC)   Acute on chronic systolic heart failure (HCC)   Septic shock secondary to perforated cecum and contamination of abdominal cavity with stool s/p subtotal colectomy and ileostomy-dehisced abdominal wound.  Wet-to-dry dressing changes twice a day per surgery. TPN stopped 01/12/2019.  Patient tolerating p.o. intake. CIR notes reviewed.  Will consult social worker for SNF.  Patient has no bed offers at this time. Patient not a candidate for CIR at this  time. - afebrile,s/p abscess drain, patient tolerating abdominal binder. Patient was treated with Zosyn and Diflucan which has been stopped 01/09/2019 Peritoneal abscess status post CT-guided drainage by interventional radiologist on 01/04/2019, Dr. Fredia Sorrow. - Management per interventional radiology; appreciate assistance -Plan to repeat CT of the abdomen once output is less than 10 cc.  Acute hypoxic respiratory failure possibly secondary to postop atelectasis - Normal oxygen supplementation at baseline - on RA; stable  Chronic combined systolic and diastolic CHF-negative by 2.7 L. - Last 2D echo LVEF 25 to 30% - Status post AICD -watch daily wts;WEIGHT 145 up from 144.3  from 148.  - willstartentresto when stabilizes  Creatinine 1.09 from 1.20 down from 1.34, potassium 4.7 magnesium 1.8   Paroxysmal A. fib History of DVT/PE/LV thrombus - continue digoxin, amiodarone - Cardiology signed off. Eliquis restarted 01/10/2019.  Morbid obesity - BMI 54 - Recommend weight loss outpatient once stable  Hypotension blood pressure 1 14 / 70 102/57.  He is not orthostatic.    Nutrition Problem: Inadequate oral intake Etiology: inability to eat     Nutrition Problem: Inadequate oral intake Etiology: inability to eat     Signs/Symptoms: NPO status    Interventions: Ensure Enlive (each supplement provides 350kcal and 20 grams of protein), Prostat, TPN  Estimated body mass index is 50.17 kg/m as calculated from the following:   Height as of this encounter: 5\' 7"  (1.702 m).   Weight as of this encounter: 145.3 kg.  DVT prophylaxis:eliquis Code Status:FULL Disposition Plan:TBD  Consultants:  Cardiology  General Surgery  Subjective: He is resting in bed no new complaints tolerating p.o. diet  Objective: Vitals:   01/13/19  0110 01/13/19 0535 01/13/19 0855 01/13/19 0924  BP:  102/66 114/70     Pulse: 79 77 72 74  Resp: 20 (!) 28 (!) 24   Temp:  98.3 F (36.8 C) 98.1 F (36.7 C)   TempSrc:  Oral Oral   SpO2: 96% 97%    Weight:  (!) 145.3 kg    Height:        Intake/Output Summary (Last 24 hours) at 01/13/2019 1207 Last data filed at 01/13/2019 0537 Gross per 24 hour  Intake 1356.01 ml  Output 3545 ml  Net -2188.99 ml   Filed Weights   01/11/19 0327 01/12/19 0500 01/13/19 0535  Weight: (!) 144.8 kg (!) 144.3 kg (!) 145.3 kg    Examination:  General exam: Appears calm and comfortable  Respiratory system: Clear to auscultation. Respiratory effort normal. Cardiovascular system: S1 & S2 heard, RRR. No JVD, murmurs, rubs, gallops or clicks. No pedal edema. Gastrointestinal system: Abdomen is distended, soft and appropriately tender. No organomegaly or masses felt. Normal bowel sounds heard.  Covered with dressing.  Ileostomy in place. Central nervous system: Alert and oriented. No focal neurological deficits. Extremities 2+ pitting edema left more than right.   Skin: No rashes, lesions or ulcers Psychiatry: Judgement and insight appear normal. Mood & affect appropriate.     Data Reviewed: I have personally reviewed following labs and imaging studies  CBC: Recent Labs  Lab 01/07/19 1432 01/08/19 0505 01/10/19 0441 01/10/19 1313 01/11/19 0500 01/12/19 0500 01/13/19 0500  WBC 23.4* 21.8* 15.7* 16.6* 15.3* 12.8* 11.0*  NEUTROABS 18.9* 16.8* 13.0*  --   --   --   --   HGB 9.3* 8.8* 9.5* 9.9* 9.3* 9.0* 9.7*  HCT 29.2* 27.2* 30.0* 30.8* 29.4* 28.7* 30.3*  MCV 89.6 89.2 89.8 89.8 90.7 91.7 91.3  PLT 301 287 276 269 254 274 305   Basic Metabolic Panel: Recent Labs  Lab 01/08/19 0505 01/09/19 0500 01/10/19 0441 01/10/19 0442 01/11/19 0500 01/13/19 0500  NA 136 132*  --  134* 136 134*  K 4.3 4.4  --  5.0 4.9 4.7  CL 98 96*  --  99 102 101  CO2 29 25  --  25 26 25   GLUCOSE 180* 261*  --  196* 150* 140*  BUN 25* 23*  --  20 19 19   CREATININE 1.45* 1.23   --  1.34* 1.20 1.09  CALCIUM 7.1* 7.1*  --  7.6* 8.0* 8.0*  MG 2.3 2.2 2.3  --  2.1 1.8  PHOS  --  3.3  --  3.4  --  4.2   GFR: Estimated Creatinine Clearance: 108.4 mL/min (by C-G formula based on SCr of 1.09 mg/dL). Liver Function Tests: Recent Labs  Lab 01/08/19 0505 01/09/19 0500 01/10/19 0442 01/11/19 0500 01/13/19 0500  AST 21  --   --  22 23  ALT 15  --   --  14 18  ALKPHOS 44  --   --  59 65  BILITOT 0.2*  --   --  0.4 0.5  PROT 5.8*  --   --  6.4* 6.3*  ALBUMIN 1.2* 1.1* 1.2* 1.3* 1.4*   No results for input(s): LIPASE, AMYLASE in the last 168 hours. No results for input(s): AMMONIA in the last 168 hours. Coagulation Profile: No results for input(s): INR, PROTIME in the last 168 hours. Cardiac Enzymes: No results for input(s): CKTOTAL, CKMB, CKMBINDEX, TROPONINI in the last 168 hours. BNP (last 3 results) No results for input(s):  PROBNP in the last 8760 hours. HbA1C: No results for input(s): HGBA1C in the last 72 hours. CBG: Recent Labs  Lab 01/12/19 1557 01/12/19 1926 01/12/19 2202 01/13/19 0931 01/13/19 1202  GLUCAP 162* 147* 159* 111* 147*   Lipid Profile: No results for input(s): CHOL, HDL, LDLCALC, TRIG, CHOLHDL, LDLDIRECT in the last 72 hours. Thyroid Function Tests: No results for input(s): TSH, T4TOTAL, FREET4, T3FREE, THYROIDAB in the last 72 hours. Anemia Panel: No results for input(s): VITAMINB12, FOLATE, FERRITIN, TIBC, IRON, RETICCTPCT in the last 72 hours. Sepsis Labs: No results for input(s): PROCALCITON, LATICACIDVEN in the last 168 hours.  Recent Results (from the past 240 hour(s))  Aerobic/Anaerobic Culture (surgical/deep wound)     Status: None   Collection Time: 01/04/19  4:00 PM   Specimen: Abscess  Result Value Ref Range Status   Specimen Description ABSCESS PERITONEAL  Final   Special Requests Normal  Final   Gram Stain   Final    MODERATE WBC PRESENT, PREDOMINANTLY PMN RARE GRAM POSITIVE RODS    Culture   Final    RARE  ESCHERICHIA COLI FEW BACTEROIDES FRAGILIS BETA LACTAMASE POSITIVE Performed at [facility], [address] [place], [place] [address]    Report Status 01/09/2019 FINAL  Final   Organism ID, Bacteria ESCHERICHIA COLI  Final      Susceptibility   Escherichia coli - MIC*    AMPICILLIN <=2 SENSITIVE Sensitive     CEFAZOLIN <=4 SENSITIVE Sensitive     CEFEPIME <=1 SENSITIVE Sensitive     CEFTAZIDIME <=1 SENSITIVE Sensitive     CEFTRIAXONE <=1 SENSITIVE Sensitive     CIPROFLOXACIN <=0.25 SENSITIVE Sensitive     GENTAMICIN <=1 SENSITIVE Sensitive     IMIPENEM <=0.25 SENSITIVE Sensitive     TRIMETH/SULFA <=20 SENSITIVE Sensitive     AMPICILLIN/SULBACTAM <=2 SENSITIVE Sensitive     PIP/TAZO <=4 SENSITIVE Sensitive     * RARE ESCHERICHIA COLI         Radiology Studies: No results found.      Scheduled Meds: . acetaminophen  650 mg Oral Q6H  . amiodarone  200 mg Oral Daily  . apixaban  5 mg Oral BID  . chlorhexidine  15 mL Mouth Rinse BID  . Chlorhexidine Gluconate Cloth  6 each Topical Daily  . digoxin  0.125 mg Oral Daily  . feeding supplement (ENSURE ENLIVE)  237 mL Oral TID BM  . feeding supplement (PRO-STAT SUGAR FREE 64)  30 mL Oral TID BM  . insulin aspart  0-20 Units Subcutaneous TID WC  . insulin aspart  0-5 Units Subcutaneous QHS  . mouth rinse  15 mL Mouth Rinse q12n4p  . methocarbamol  500 mg Oral TID  . multivitamin with minerals  1 tablet Oral Daily  . pantoprazole  40 mg Oral Q1200  . sodium chloride flush  10-40 mL Intracatheter Q12H  . sodium chloride flush  5 mL Intracatheter Q8H   Continuous Infusions: . sodium chloride 10 mL/hr at 01/09/19 1800  . sodium chloride       LOS: 24 days     Georgette Shell, MD [facility]  If 7PM-7AM, please contact night-coverage [email or web address] Password [email or web address] 01/13/2019, 12:07 PM

## 2019-01-13 NOTE — TOC Progression Note (Signed)
Transition of Care [facility]) - Progression Note    Patient Details  Name: ANGIE HOGG MRN: [id number] Date of Birth: 1965-01-25  Transition of Care [facility]) CM/SW Contact  Eileen Stanford, LCSW Phone Number: 01/13/2019, 11:10 AM  Clinical Narrative:   Pt has no bed offers at this time. Barriers will be TPN and pt is bariatric.    Expected Discharge Plan: IP Rehab Facility Barriers to Discharge: Continued Medical Work up, Ship broker  Expected Discharge Plan and Services Expected Discharge Plan: [place] In-house Referral: Clinical Social Work Discharge Planning Services: [employer] Consult Post Acute Care Choice: IP Rehab Living arrangements for the past 2 months: Apartment                                       Social Determinants of Health (SDOH) Interventions    Readmission Risk Interventions Readmission Risk Prevention Plan 01/04/2019  Transportation Screening Complete  PCP or Specialist Appt within 3-5 Days Not Complete  Not Complete comments pt not stable for dc at this time  [place] or [place] Complete  Social Work Consult for [place] Planning/Counseling Complete  Palliative Care Screening Not Applicable  Medication Review Press photographer) Referral to Pharmacy  Some recent data might be hidden

## 2019-01-13 NOTE — Progress Notes (Signed)
[place] for  Eliquis Indication: hx VTE, LV mural thrombus, afib   Allergies  Allergen Reactions  . Shrimp [Shellfish Allergy] Hives and Itching    Patient Measurements: Height: 5\' 7"  (170.2 cm) Weight: (!) 320 lb 5.3 oz (145.3 kg) IBW/kg (Calculated) : 66.1 Heparin Dosing Weight:  101.6 kg  Vital Signs: Temp: 98.1 F (36.7 C) (12/31 0855) Temp Source: Oral (12/31 0855) BP: 114/70 (12/31 0855) Pulse Rate: 74 (12/31 0924)  Labs: Recent Labs    01/11/19 0500 01/12/19 0500 01/13/19 0500  HGB 9.3* 9.0* 9.7*  HCT 29.4* 28.7* 30.3*  PLT 254 274 305  CREATININE 1.20  --  1.09    Estimated Creatinine Clearance: 108.4 mL/min (by C-G formula based on SCr of 1.09 mg/dL).   Assessment: 53 y.o. male with h/o Afib and LV thrombus, Eliquis held and now restarted.  No overt bleeding noted.  Goal of Therapy:  Monitor platelets by anticoagulation protocol: Yes   Plan:  Continue Eliquis 5 mg po BID. Watch CBC.   Joda Braatz A. Levada Dy, PharmD, BCPS, FNKF Clinical Pharmacist  Please utilize Amion for appropriate phone number to reach the unit pharmacist ([place])    01/13/2019 9:31 AM

## 2019-01-13 NOTE — Progress Notes (Signed)
16 Days Post-Op   Subjective/Chief Complaint: tol diet, having bowel function   Objective: Vital signs in last 24 hours: Temp:  [97.4 F (36.3 C)-98.6 F (37 C)] 98.1 F (36.7 C) (12/31 0855) Pulse Rate:  [72-80] 74 (12/31 0924) Resp:  [20-34] 24 (12/31 0855) BP: (99-114)/(57-70) 114/70 (12/31 0855) SpO2:  [93 %-97 %] 97 % (12/31 0535) Weight:  [145.3 kg] 145.3 kg (12/31 0535) Last BM Date: 01/12/19  Intake/Output from previous day: 12/30 0701 - 12/31 0700 In: 2118.3 [P.O.:1493; I.V.:625.3] Out: 4270 [VZCHY:8502; Drains:95; Stool:750] Intake/Output this shift: No intake/output data recorded.  NAD Abdomen soft drains serous, bs present, approp tender, wound unchanged with some granulation tissue  Lab Results:  Recent Labs    01/12/19 0500 01/13/19 0500  WBC 12.8* 11.0*  HGB 9.0* 9.7*  HCT 28.7* 30.3*  PLT 274 305   BMET Recent Labs    01/11/19 0500 01/13/19 0500  NA 136 134*  K 4.9 4.7  CL 102 101  CO2 26 25  GLUCOSE 150* 140*  BUN 19 19  CREATININE 1.20 1.09  CALCIUM 8.0* 8.0*   PT/INR No results for input(s): LABPROT, INR in the last 72 hours. ABG No results for input(s): PHART, HCO3 in the last 72 hours.  Invalid input(s): PCO2, PO2  Studies/Results: No results found.  Anti-infectives: Anti-infectives (From admission, onward)   Start     Dose/Rate Route Frequency Ordered Stop   12/28/18 1400  fluconazole (DIFLUCAN) IVPB 400 mg     400 mg 100 mL/hr over 120 Minutes Intravenous Every 24 hours 12/28/18 1318 01/09/19 2359   12/20/18 2200  piperacillin-tazobactam (ZOSYN) IVPB 3.375 g     3.375 g 12.5 mL/hr over 240 Minutes Intravenous Every 8 hours 12/20/18 1834 01/09/19 2359   12/20/18 0645  piperacillin-tazobactam (ZOSYN) IVPB 3.375 g  Status:  Discontinued     3.375 g 100 mL/hr over 30 Minutes Intravenous Every 6 hours 12/20/18 0630 12/20/18 1833   12/19/18 2315  piperacillin-tazobactam (ZOSYN) IVPB 3.375 g     3.375 g 100 mL/hr over 30  Minutes Intravenous  Once 12/19/18 2302 12/20/18 0003      Assessment/Plan: Left colonic stricture of unknown etiology; with perforated cecum POD#15STC/ileostomy fordiverticular stricture of left colon and perforated R colon12/15/2020,Dr. Rolm Bookbinder - pathwithout evidence of malignancy -s/p drain placement 12/22 - cxsE coli pansensitive,abx stopped 12/27. Plan repeat CT once output minimal, IR following -ileostomy functioning, tolerating a diet.Prealbumin improving but still low (12.5).TPN off - wound dehisced, monitor closely. BID wet to dry dressing changes - abdominal binder - oob, should be ready for dc when medically able -does not need foley from my standpoint FEN:cm diet and supplements DXA:JOINOMVE>> 12/28 ID: IV Zosyn 12/6>>12/27, IV diflucan 12/15>>12/27 Follow up:  Dr. Rosanne Ashing 01/13/2019

## 2019-01-13 NOTE — Progress Notes (Signed)
Referring Physician(s): Darnelle Spangle  Supervising Physician: Simonne Come  Patient Status:  [facility] - In-pt  Chief Complaint:  Abdominal pain/abscess  Subjective: Pt doing ok today; no new c/o ; abd discomfort present but not worsening per pt; denies N/V  Allergies: Shrimp [shellfish allergy]  Medications: Prior to Admission medications   Medication Sig Start Date End Date Taking? Authorizing Provider  acetaminophen (TYLENOL) 650 MG CR tablet Take 1,300 mg by mouth every 8 (eight) hours as needed for pain.   Yes [provider]  amiodarone (PACERONE) 200 MG tablet Take 1 tablet (200 mg total) by mouth daily. 10/18/18  Yes Bensimhon, Bevelyn Buckles, MD  coconut oil OIL Apply 1 application topically as needed (dry skin).    Yes [provider]  digoxin (LANOXIN) 0.125 MG tablet TAKE 1 TABLET(0.125 MG) BY MOUTH DAILY Patient taking differently: Take 0.125 mg by mouth daily.  11/01/18  Yes Bensimhon, Bevelyn Buckles, MD  ELIQUIS 5 MG TABS tablet TAKE 1 TABLET(5 MG) BY MOUTH TWICE DAILY Patient taking differently: Take 5 mg by mouth 2 (two) times daily.  03/22/18  Yes Bensimhon, Bevelyn Buckles, MD  ENTRESTO 49-51 MG TAKE 1 TABLET BY MOUTH TWICE DAILY Patient taking differently: Take 1 tablet by mouth 2 (two) times daily.  09/15/18  Yes Bensimhon, Bevelyn Buckles, MD  potassium chloride SA (KLOR-CON) 20 MEQ tablet Take 2 tablets (40 mEq total) by mouth 2 (two) times daily. 10/18/18  Yes Bensimhon, Bevelyn Buckles, MD  spironolactone (ALDACTONE) 25 MG tablet TAKE 1 TABLET(25 MG) BY MOUTH DAILY Patient taking differently: Take 25 mg by mouth daily.  12/03/18  Yes [name], MD  torsemide (DEMADEX) 20 MG tablet TAKE 2 TABLETS BY MOUTH TWICE DAILY. STOP LASIX Patient taking differently: Take 40 mg by mouth 2 (two) times daily.  06/29/18  Yes Bensimhon, Bevelyn Buckles, MD     Vital Signs: BP 114/70 (BP Location: Left Arm)   Pulse 74   Temp 98.1 F (36.7 C) (Oral)   Resp (!) 24   Ht 5\' 7"  (1.702 m)   Wt  (!) 320 lb 5.3 oz (145.3 kg)   SpO2 97%   BMI 50.17 kg/m   Physical Exam awake/alert; right abd drain intact, insertion site ok, mildly tender,OP 30 cc turbid, reddish beige fluid  Imaging: No results found.  Labs:  CBC: Recent Labs    01/10/19 1313 01/11/19 0500 01/12/19 0500 01/13/19 0500  WBC 16.6* 15.3* 12.8* 11.0*  HGB 9.9* 9.3* 9.0* 9.7*  HCT 30.8* 29.4* 28.7* 30.3*  PLT 269 254 274 305    COAGS: Recent Labs    12/28/18 0447 12/28/18 1410 12/29/18 0500 12/30/18 0447 12/30/18 1639  INR  --  1.5*  --   --   --   APTT 106*  --  29 32 78*    BMP: Recent Labs    01/09/19 0500 01/10/19 0442 01/11/19 0500 01/13/19 0500  NA 132* 134* 136 134*  K 4.4 5.0 4.9 4.7  CL 96* 99 102 101  CO2 25 25 26 25   GLUCOSE 261* 196* 150* 140*  BUN 23* 20 19 19   CALCIUM 7.1* 7.6* 8.0* 8.0*  CREATININE 1.23 1.34* 1.20 1.09  GFRNONAA >60 >60 >60 >60  GFRAA >60 >60 >60 >60    LIVER FUNCTION TESTS: Recent Labs    01/06/19 0401 01/08/19 0505 01/09/19 0500 01/10/19 0442 01/11/19 0500 01/13/19 0500  BILITOT 0.6 0.2*  --   --  0.4 0.5  AST 31 21  --   --  22 23  ALT 20 15  --   --  14 18  ALKPHOS 49 44  --   --  59 65  PROT 5.6* 5.8*  --   --  6.4* 6.3*  ALBUMIN 1.2* 1.2* 1.1* 1.2* 1.3* 1.4*    Assessment and Plan: Pt with hx postop rt abd abscess, s/p drain placement 12/22; afebrile; WBC 11(12.8), hgb 9.7(9.0), creat nl; drain fluid cx- e coli; bacteroides; cont current tx; once drain OP< 10-15 cc/day for 2-3 days or if clinical status worsens/WBC increases obtain f/u CT; may need drain injection as well before removal   Electronically Signed: D. Rowe Robert, PA-C 01/13/2019, 12:15 PM   I spent a total of 15 minutes at the the patient's bedside AND on the patient's hospital floor or unit, greater than 50% of which was counseling/coordinating care for right abdominal abscess drain    Patient ID: Roberto [name], male   DOB: 1965/01/26, 53 y.o.   MRN:  [id number]

## 2019-01-14 LAB — CBC
HCT: 31.1 % — ABNORMAL LOW (ref 39.0–52.0)
Hemoglobin: 9.6 g/dL — ABNORMAL LOW (ref 13.0–17.0)
MCH: 28.4 pg (ref 26.0–34.0)
MCHC: 30.9 g/dL (ref 30.0–36.0)
MCV: 92 fL (ref 80.0–100.0)
Platelets: 299 10*3/uL (ref 150–400)
RBC: 3.38 MIL/uL — ABNORMAL LOW (ref 4.22–5.81)
RDW: 15.2 % (ref 11.5–15.5)
WBC: 11 10*3/uL — ABNORMAL HIGH (ref 4.0–10.5)
nRBC: 0 % (ref 0.0–0.2)

## 2019-01-14 LAB — BASIC METABOLIC PANEL
Anion gap: 9 (ref 5–15)
BUN: 20 mg/dL (ref 6–20)
CO2: 25 mmol/L (ref 22–32)
Calcium: 8.1 mg/dL — ABNORMAL LOW (ref 8.9–10.3)
Chloride: 102 mmol/L (ref 98–111)
Creatinine, Ser: 1.02 mg/dL (ref 0.61–1.24)
GFR calc Af Amer: >60 mL/min (ref >60)
GFR calc non Af Amer: >60 mL/min (ref >60)
Glucose, Bld: 112 mg/dL — ABNORMAL HIGH (ref 70–99)
Potassium: 4.4 mmol/L (ref 3.5–5.1)
Sodium: 136 mmol/L (ref 135–145)

## 2019-01-14 LAB — GLUCOSE, CAPILLARY
Glucose-Capillary: 110 mg/dL — ABNORMAL HIGH (ref 70–99)
Glucose-Capillary: 130 mg/dL — ABNORMAL HIGH (ref 70–99)
Glucose-Capillary: 116 mg/dL — ABNORMAL HIGH (ref 70–99)
Glucose-Capillary: 135 mg/dL — ABNORMAL HIGH (ref 70–99)

## 2019-01-14 MED ORDER — TORSEMIDE 20 MG PO TABS
20.0000 mg | ORAL_TABLET | Freq: Every day | ORAL | Status: DC
  Administered 2019-01-14 – 2019-01-22 (×9): 20 mg via ORAL
  Filled 2019-01-14 (×9): qty 1

## 2019-01-14 MED ORDER — SPIRONOLACTONE 12.5 MG HALF TABLET
12.5000 mg | ORAL_TABLET | Freq: Every day | ORAL | Status: DC
  Administered 2019-01-14 – 2019-01-15 (×2): 12.5 mg via ORAL
  Filled 2019-01-14 (×3): qty 1

## 2019-01-14 NOTE — Progress Notes (Signed)
Progress Note  Patient Name: Roberto [name] Date of Encounter: 01/14/2019  Primary Cardiologist: Arvilla Meres, MD    Patient Profile     54 y.o. male with a hx of chronic diastolic HF,h/o ofLV thrombus, nonischemic cardiomyopathy (EF 25-30% 2013, EF 2020 25-30%), ICD placed 08/2018, PSVT (elected for medical therapy over RFA), DVT/PE, Paroxysmal afib on amiodarone, Aflutter ablation 11/2016, lupus, and morbid obesityinitially seen in preop consultation early 12/20 for diverticulitis   It did not improve with medical therapy and went to OR 12/15 and found to have non malignant mass and perforated cecum and abscess which required drainage-still present 1/1  OR course cx by hypotension requiring pressors >>ICU w dx of sepsis.  Now off Abx.  BP still low(1/1)  anticoagulation w apixoban   Subjective  Feels good without sob or chest pain   Inpatient Medications    Scheduled Meds: . acetaminophen  650 mg Oral Q6H  . amiodarone  200 mg Oral Daily  . apixaban  5 mg Oral BID  . chlorhexidine  15 mL Mouth Rinse BID  . Chlorhexidine Gluconate Cloth  6 each Topical Daily  . digoxin  0.125 mg Oral Daily  . feeding supplement (ENSURE ENLIVE)  237 mL Oral TID BM  . feeding supplement (PRO-STAT SUGAR FREE 64)  30 mL Oral TID BM  . insulin aspart  0-20 Units Subcutaneous TID WC  . insulin aspart  0-5 Units Subcutaneous QHS  . mouth rinse  15 mL Mouth Rinse q12n4p  . methocarbamol  500 mg Oral TID  . multivitamin with minerals  1 tablet Oral Daily  . pantoprazole  40 mg Oral Q1200  . sodium chloride flush  10-40 mL Intracatheter Q12H  . sodium chloride flush  5 mL Intracatheter Q8H   Continuous Infusions: . sodium chloride 10 mL/hr at 01/09/19 1800  . sodium chloride     PRN Meds: sodium chloride, alum & mag hydroxide-simeth, morphine injection, ondansetron (ZOFRAN) IV, oxyCODONE, sodium chloride flush   Vital Signs    Vitals:   01/13/19 1315 01/13/19 1602 01/13/19 2203 01/14/19  0630  BP: (!) 96/56 (!) 92/52 (!) 104/54 97/60  Pulse: 79 79 82 79  Resp: (!) 21 20 17  (!) 22  Temp: 98.4 F (36.9 C) 98.6 F (37 C) 99.1 F (37.3 C) 98.6 F (37 C)  TempSrc: Oral Oral Oral Oral  SpO2: 96% (!) 86% 96% 94%  Weight:    (!) 143.2 kg  Height:        Intake/Output Summary (Last 24 hours) at 01/14/2019 0950 Last data filed at 01/14/2019 0110 Gross per 24 hour  Intake 10 ml  Output 3945 ml  Net -3935 ml   Filed Weights   01/12/19 0500 01/13/19 0535 01/14/19 0630  Weight: (!) 144.3 kg (!) 145.3 kg (!) 143.2 kg    Telemetry    Sinus with PVCs aout 5%  Personally Reviewed   Physical Exam  BP 97/60 (BP Location: Left Arm)   Pulse 79   Temp 98.6 F (37 C) (Oral)   Resp (!) 22   Ht 5\' 7"  (1.702 m)   Wt (!) 143.2 kg   SpO2 94%   BMI 49.45 kg/m  Well developed and Morbidly obese in no acute distress HENT normal Neck supple with JVP-  flat   Clear Regular rate and rhythm, no murmurs or gallops Abd-soft with active BS No Clubbing cyanosis B edema 1-2+ edema Skin-warm and dry A & Oriented  Grossly normal sensory  and motor function     Labs    Chemistry Recent Labs  Lab 01/08/19 0505 01/10/19 0442 01/11/19 0500 01/13/19 0500 01/14/19 0558  NA 136 134* 136 134* 136  K 4.3 5.0 4.9 4.7 4.4  CL 98 99 102 101 102  CO2 29 25 26 25 25   GLUCOSE 180* 196* 150* 140* 112*  BUN 25* 20 19 19 20   CREATININE 1.45* 1.34* 1.20 1.09 1.02  CALCIUM 7.1* 7.6* 8.0* 8.0* 8.1*  PROT 5.8*  --  6.4* 6.3*  --   ALBUMIN 1.2* 1.2* 1.3* 1.4*  --   AST 21  --  22 23  --   ALT 15  --  14 18  --   ALKPHOS 44  --  59 65  --   BILITOT 0.2*  --  0.4 0.5  --   GFRNONAA 55* >60 >60 >60 >60  GFRAA >60 >60 >60 >60 >60  ANIONGAP 9 10 8 8 9      Hematology Recent Labs  Lab 01/12/19 0500 01/13/19 0500 01/14/19 0558  WBC 12.8* 11.0* 11.0*  RBC 3.13* 3.32* 3.38*  HGB 9.0* 9.7* 9.6*  HCT 28.7* 30.3* 31.1*  MCV 91.7 91.3 92.0  MCH 28.8 29.2 28.4  MCHC 31.4 32.0 30.9  RDW  15.7* 15.5 15.2  PLT 274 305 299    Cardiac EnzymesNo results for input(s): TROPONINI in the last 168 hours. No results for input(s): TROPIPOC in the last 168 hours.   BNPNo results for input(s): BNP, PROBNP in the last 168 hours.   DDimer No results for input(s): DDIMER in the last 168 hours.   Radiology    No results found.  Cardiac Studies   Echocardiogram 07/20/2018 IMPRESSIONS  1. The left ventricle has severely reduced systolic function, with an ejection fraction of 25-30%. The cavity size was moderately dilated. Left ventricular diastolic Doppler parameters are consistent with pseudonormalization. Left ventricular diffuse  hypokinesis. 2. The right ventricle has normal systolic function. The cavity was normal. There is no increase in right ventricular wall thickness. 3. Left atrial size was mildly dilated. 4. No evidence of mitral valve stenosis. Trivial mitral regurgitation. 5. The aortic valve is tricuspid. No stenosis of the aortic valve. 6. The aortic root is normal in size and structure. 7. The inferior vena cava was dilated in size with <50% respiratory variability. No complete TR doppler jet so unable to estimate PA systolic pressure.     Assessment & Plan    Acute diverticulitis/Colon Mass/ Sepsis   Chronic systolic HF (LVEF 09-47%) with volume overload  BP low   Morbid obesity with BMI 54.  ICD in place    Paroxysmal Afib     Volume overloaded  Diuretics/ afterload have been on hold in setting of sepsis  Will resume aldactone and low dose torsemide today       Signed, Virl Axe, MD  01/14/2019, 9:50 AM

## 2019-01-14 NOTE — Progress Notes (Signed)
PROGRESS NOTE    Roberto [name]  WER:[id number] DOB: 11-16-1965 DOA: 12/19/2018 PCP: [name], MD (Confirm with patient/family/NH records and if not entered, this HAS to be entered at [facility] point of entry. "No PCP" if truly none.) Outpatient Specialists: (Specify speciality and name if known)    Brief Narrative  Pt. With history of NICM, EF 25-30%, PSVT, PAF, chronic systolic HF with AICD, hx of DVT/PE/LV thrombus , on chronic anticoagulation, was admitted 12/20/2018 for acute diverticulitis. He was initially treated conservatively, but had no real improvement and was taken to the OR 12/28/2018. Pt had Left colonic stricture of unknown etiology, and perforated cecum. Per surgery's note there was a mass in the mid descending colon (pathology negative for malignancy). The right colon was ischemic and had perforated. There was visualization of leakage of a large amount of stool throughout his entire abdominal cavity. In OR patient became hypotensive, required pressors (Phenylephrine and Levo). Surgery felt the patient was not ready for extubation, and considering co morbidities and possible septic insult asked PCCM to admit to ICU manage care. Transferred to [facility] service on 01/03/2019 now off pressors.   Today 01/14/19: still improving, tolerating diet, plan repeat CT once drain's output minimal, IR following   Assessment & Plan:   Principal Problem:   Acute diverticulitis Active Problems:   Obesity, morbid (HCC)   Nonischemic cardiomyopathy (HCC)   Paroxysmal SVT (supraventricular tachycardia) (HCC)   Chronic systolic heart failure (HCC)   Chronic pain of right knee   History of DVT (deep vein thrombosis)   PAF (paroxysmal atrial fibrillation) (HCC)   Pre-operative cardiovascular examination   Bowel obstruction (HCC)   Respiratory failure (HCC)   Acute on chronic systolic heart failure (HCC)  Septic shock secondary to perforated cecum and contamination of abdominal cavity  with stool 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 Wet-to-dry dressing changes twice a day per surgery. TPN stopped 01/12/2019.  Patient tolerating p.o. intake. afebrile,s/p abscess drain, patient tolerating abdominal binder.  Patient was treated with Zosyn and Diflucan which has been stopped 01/09/2019 Peritoneal abscess status post CT-guided drainage by interventional radiologist on 01/04/2019, Dr. Fredia Sorrow. Management per interventional radiology; appreciate assistance Plan to repeat CT of the abdomen once output is less than 10 cc.  Acute hypoxic respiratory failure possibly secondary to postop atelectasis Normal oxygen supplementation at baseline  on RA; stable  Chronic combined systolic and diastolic CHF negative by 2.7 L. Last 2D echo LVEF 25 to 30% Status post AICD watch daily wts;WEIGHT 145 up from 144.3 from 148.  willstartentresto when stabilizes Cardiology restarted aldactone and low dose torsemide today   Paroxysmal A. fib History of DVT/PE/LV thrombus continue digoxin, amiodarone Cardiology following intermittently Eliquis restarted 01/10/2019.  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  Patient has no bed offers at this time. Patient not a candidate for CIR at this time.  Consultants:   Surgery  Cardiology - following intermittently  radiology  Procedures:   01/04/19: CT Guided Drainage of Peritoneal Abscess  12/28/18: Exploratory laparotomy, Subtotal colectomy, End ileostomy for Left colonic stricture of unknown etiology, perforated cecum  12/23/18: Flexible Sigmoidoscopy    Antimicrobials Anti-infectives (From admission, onward)   Start     Dose/Rate Route Frequency Ordered Stop   12/28/18 1400  fluconazole (DIFLUCAN) IVPB 400 mg  400 mg 100 mL/hr over 120 Minutes Intravenous Every 24 hours 12/28/18 1318 01/09/19 2359   12/20/18 2200  piperacillin-tazobactam (ZOSYN) IVPB 3.375 g     3.375 g 12.5 mL/hr over 240 Minutes Intravenous Every 8 hours 12/20/18 1834 01/09/19 2359   12/20/18 0645  piperacillin-tazobactam (ZOSYN) IVPB 3.375 g  Status:  Discontinued     3.375 g 100 mL/hr over 30 Minutes Intravenous Every 6 hours 12/20/18 0630 12/20/18 1833   12/19/18 2315  piperacillin-tazobactam (ZOSYN) IVPB 3.375 g     3.375 g 100 mL/hr over 30 Minutes Intravenous  Once 12/19/18 2302 12/20/18 0003       Subjective: Patient feeling okay today, weak, no chest pain or trouble breathing, abdominal pain is improving   Objective: Vitals:   01/13/19 1602 01/13/19 2203 01/14/19 0630 01/14/19 1738  BP: (!) 92/52 (!) 104/54 97/60 92/62   Pulse: 79 82 79 73  Resp: 20 17 (!) 22 19  Temp: 98.6 F (37 C) 99.1 F (37.3 C) 98.6 F (37 C) 99 F (37.2 C)  TempSrc: Oral Oral Oral Oral  SpO2: (!) 86% 96% 94% 97%  Weight:   (!) 143.2 kg   Height:        Intake/Output Summary (Last 24 hours) at 01/14/2019 1752 Last data filed at 01/14/2019 1736 Gross per 24 hour  Intake 10 ml  Output 4100 ml  Net -4090 ml   Filed Weights   01/12/19 0500 01/13/19 0535 01/14/19 0630  Weight: (!) 144.3 kg (!) 145.3 kg (!) 143.2 kg    Examination:  General exam: Appears calm and comfortable  Respiratory system: Clear to auscultation. Respiratory effort normal. Cardiovascular system: S1 & S2 heard, RRR. No JVD, murmurs, rubs, gallops or clicks. No pedal edema. Gastrointestinal system: Abdomen is nondistended, soft and nontender.  Central nervous system: Alert and oriented. No focal neurological deficits Psychiatry: Judgement and insight appear normal. Mood & affect appropriate.     Data Reviewed: I have personally reviewed following labs and imaging studies  CBC: Recent Labs  Lab 01/08/19 0505 01/10/19 0441 01/10/19 1313 01/11/19  0500 01/12/19 0500 01/13/19 0500 01/14/19 0558  WBC 21.8* 15.7* 16.6* 15.3* 12.8* 11.0* 11.0*  NEUTROABS 16.8* 13.0*  --   --   --   --   --   HGB 8.8* 9.5* 9.9* 9.3* 9.0* 9.7* 9.6*  HCT 27.2* 30.0* 30.8* 29.4* 28.7* 30.3* 31.1*  MCV 89.2 89.8 89.8 90.7 91.7 91.3 92.0  PLT 287 276 269 254 274 305 258   Basic Metabolic Panel: Recent Labs  Lab 01/08/19 0505 01/09/19 0500 01/10/19 0441 01/10/19 0442 01/11/19 0500 01/13/19 0500 01/14/19 0558  NA 136 132*  --  134* 136 134* 136  K 4.3 4.4  --  5.0 4.9 4.7 4.4  CL 98 96*  --  99 102 101 102  CO2 29 25  --  25 26 25 25   GLUCOSE 180* 261*  --  196* 150* 140* 112*  BUN 25* 23*  --  20 19 19 20   CREATININE 1.45* 1.23  --  1.34* 1.20 1.09 1.02  CALCIUM 7.1* 7.1*  --  7.6* 8.0* 8.0* 8.1*  MG 2.3 2.2 2.3  --  2.1 1.8  --   PHOS  --  3.3  --  3.4  --  4.2  --    GFR: Estimated Creatinine Clearance: 114.8 mL/min (by C-G formula based on SCr of 1.02 mg/dL). Liver Function Tests: Recent Labs  Lab 01/08/19 0505 01/09/19 0500 01/10/19 5277  01/11/19 0500 01/13/19 0500  AST 21  --   --  22 23  ALT 15  --   --  14 18  ALKPHOS 44  --   --  59 65  BILITOT 0.2*  --   --  0.4 0.5  PROT 5.8*  --   --  6.4* 6.3*  ALBUMIN 1.2* 1.1* 1.2* 1.3* 1.4*   No results for input(s): LIPASE, AMYLASE in the last 168 hours. No results for input(s): AMMONIA in the last 168 hours. Coagulation Profile: No results for input(s): INR, PROTIME in the last 168 hours. Cardiac Enzymes: No results for input(s): CKTOTAL, CKMB, CKMBINDEX, TROPONINI in the last 168 hours. BNP (last 3 results) No results for input(s): PROBNP in the last 8760 hours. HbA1C: No results for input(s): HGBA1C in the last 72 hours. CBG: Recent Labs  Lab 01/13/19 1601 01/13/19 2159 01/14/19 0801 01/14/19 1144 01/14/19 1734  GLUCAP 140* 159* 110* 130* 116*   Lipid Profile: No results for input(s): CHOL, HDL, LDLCALC, TRIG, CHOLHDL, LDLDIRECT in the last 72 hours. Thyroid  Function Tests: No results for input(s): TSH, T4TOTAL, FREET4, T3FREE, THYROIDAB in the last 72 hours. Anemia Panel: No results for input(s): VITAMINB12, FOLATE, FERRITIN, TIBC, IRON, RETICCTPCT in the last 72 hours. Urine analysis:    Component Value Date/Time   COLORURINE YELLOW 12/19/2018 2038   APPEARANCEUR CLEAR 12/19/2018 2038   LABSPEC >1.030 (H) 12/19/2018 2038   LABSPEC 1.025 03/18/2017 1337   PHURINE 5.5 12/19/2018 2038   GLUCOSEU NEGATIVE 12/19/2018 2038   HGBUR MODERATE (A) 12/19/2018 2038   BILIRUBINUR SMALL (A) 12/19/2018 2038   BILIRUBINUR negative 03/18/2017 1337   BILIRUBINUR neg 12/17/2015 0944   KETONESUR NEGATIVE 12/19/2018 2038   PROTEINUR >300 (A) 12/19/2018 2038   UROBILINOGEN negative 12/17/2015 0944   UROBILINOGEN 0.2 12/16/2011 1208   NITRITE NEGATIVE 12/19/2018 2038   LEUKOCYTESUR NEGATIVE 12/19/2018 2038   Sepsis Labs: @LABRCNTIP (procalcitonin:4,lacticidven:4)  )No results found for this or any previous visit (from the past 240 hour(s)).       Radiology Studies: No results found.      Scheduled Meds: . acetaminophen  650 mg Oral Q6H  . amiodarone  200 mg Oral Daily  . apixaban  5 mg Oral BID  . chlorhexidine  15 mL Mouth Rinse BID  . Chlorhexidine Gluconate Cloth  6 each Topical Daily  . digoxin  0.125 mg Oral Daily  . feeding supplement (ENSURE ENLIVE)  237 mL Oral TID BM  . feeding supplement (PRO-STAT SUGAR FREE 64)  30 mL Oral TID BM  . insulin aspart  0-20 Units Subcutaneous TID WC  . insulin aspart  0-5 Units Subcutaneous QHS  . mouth rinse  15 mL Mouth Rinse q12n4p  . methocarbamol  500 mg Oral TID  . multivitamin with minerals  1 tablet Oral Daily  . pantoprazole  40 mg Oral Q1200  . sodium chloride flush  10-40 mL Intracatheter Q12H  . sodium chloride flush  5 mL Intracatheter Q8H  . spironolactone  12.5 mg Oral Daily  . torsemide  20 mg Oral Daily   Continuous Infusions: . sodium chloride 10 mL/hr at 01/09/19 1800  .  sodium chloride       LOS: 25 days    Time spent: 25 Mins    01/11/19, DO 01/14/2019, 5:52 PM

## 2019-01-14 NOTE — Progress Notes (Signed)
17 Days Post-Op   Subjective/Chief Complaint: tol diet, having bowel function   Objective: Vital signs in last 24 hours: Temp:  [98.4 F (36.9 C)-99.1 F (37.3 C)] 98.6 F (37 C) (01/01 0630) Pulse Rate:  [79-82] 79 (01/01 0630) Resp:  [17-22] 22 (01/01 0630) BP: (92-104)/(52-60) 97/60 (01/01 0630) SpO2:  [86 %-96 %] 94 % (01/01 0630) Weight:  [143.2 kg] 143.2 kg (01/01 0630) Last BM Date: 01/13/19  Intake/Output from previous day: 12/31 0701 - 01/01 0700 In: 10 [I.V.:10] Out: 3945 [Urine:3475; Drains:50; Stool:420] Intake/Output this shift: No intake/output data recorded.  NAD Abdomen soft drains serous, bs present, approp tender  Lab Results:  Recent Labs    01/13/19 0500 01/14/19 0558  WBC 11.0* 11.0*  HGB 9.7* 9.6*  HCT 30.3* 31.1*  PLT 305 299   BMET Recent Labs    01/13/19 0500 01/14/19 0558  NA 134* 136  K 4.7 4.4  CL 101 102  CO2 25 25  GLUCOSE 140* 112*  BUN 19 20  CREATININE 1.09 1.02  CALCIUM 8.0* 8.1*   PT/INR No results for input(s): LABPROT, INR in the last 72 hours. ABG No results for input(s): PHART, HCO3 in the last 72 hours.  Invalid input(s): PCO2, PO2  Studies/Results: No results found.  Anti-infectives: Anti-infectives (From admission, onward)   Start     Dose/Rate Route Frequency Ordered Stop   12/28/18 1400  fluconazole (DIFLUCAN) IVPB 400 mg     400 mg 100 mL/hr over 120 Minutes Intravenous Every 24 hours 12/28/18 1318 01/09/19 2359   12/20/18 2200  piperacillin-tazobactam (ZOSYN) IVPB 3.375 g     3.375 g 12.5 mL/hr over 240 Minutes Intravenous Every 8 hours 12/20/18 1834 01/09/19 2359   12/20/18 0645  piperacillin-tazobactam (ZOSYN) IVPB 3.375 g  Status:  Discontinued     3.375 g 100 mL/hr over 30 Minutes Intravenous Every 6 hours 12/20/18 0630 12/20/18 1833   12/19/18 2315  piperacillin-tazobactam (ZOSYN) IVPB 3.375 g     3.375 g 100 mL/hr over 30 Minutes Intravenous  Once 12/19/18 2302 12/20/18 0003       Assessment/Plan: Left colonic stricture of unknown etiology; with perforated cecum POD#16STC/ileostomy fordiverticular stricture of left colon and perforated R colon12/15/2020,Dr. Emelia Loron - pathwithout evidence of malignancy -s/p drain placement 12/22 - cxsE coli pansensitive,abx stopped 12/27. Plan repeat CT once output minimal, IR following -ileostomy functioning, tolerating a diet.Prealbumin improving but still low (12.5).TPN off - wound dehisced, monitor closely. BID wet to dry dressing changes with adaptic in the base - abdominal binder - oob, should be ready for dc when medically able  FEN:cm diet and supplements XHB:[id number]>> 12/28 ID: IV Zosyn 12/6>>12/27, IV diflucan 12/15>>12/27 Follow up:  Dr. Milas Hock 01/14/2019

## 2019-01-15 LAB — CBC
HCT: 29.8 % — ABNORMAL LOW (ref 39.0–52.0)
Hemoglobin: 9.6 g/dL — ABNORMAL LOW (ref 13.0–17.0)
MCH: 28.7 pg (ref 26.0–34.0)
MCHC: 32.2 g/dL (ref 30.0–36.0)
MCV: 89 fL (ref 80.0–100.0)
Platelets: 327 10*3/uL (ref 150–400)
RBC: 3.35 MIL/uL — ABNORMAL LOW (ref 4.22–5.81)
RDW: 14.7 % (ref 11.5–15.5)
WBC: 10.5 10*3/uL (ref 4.0–10.5)
nRBC: 0 % (ref 0.0–0.2)

## 2019-01-15 LAB — GLUCOSE, CAPILLARY
Glucose-Capillary: 106 mg/dL — ABNORMAL HIGH (ref 70–99)
Glucose-Capillary: 150 mg/dL — ABNORMAL HIGH (ref 70–99)
Glucose-Capillary: 119 mg/dL — ABNORMAL HIGH (ref 70–99)
Glucose-Capillary: 133 mg/dL — ABNORMAL HIGH (ref 70–99)

## 2019-01-15 NOTE — TOC Progression Note (Signed)
Transition of Care [facility]) - Progression Note    Patient Details  Name: Roberto [name] MRN: [id number] Date of Birth: 24-Jun-1965  Transition of Care [facility]) CM/SW Contact  Patrice [place], [place] Phone Number: [phone] 01/15/2019, 4:29 PM  Clinical Narrative:     CSW has checked SNF hub throughout the day and patient still does not have bed offers.  TOC will continue to follow for discharge planning needs.  Expected Discharge Plan: IP Rehab Facility Barriers to Discharge: Continued Medical Work up, English as a second language teacher  Expected Discharge Plan and Services Expected Discharge Plan: IP Rehab Facility In-house Referral: Clinical Social Work Discharge Planning Services: [employer] Consult Post Acute Care Choice: IP Rehab Living arrangements for the past 2 months: Apartment                                       Social Determinants of Health (SDOH) Interventions    Readmission Risk Interventions Readmission Risk Prevention Plan 01/04/2019  Transportation Screening Complete  PCP or Specialist Appt within 3-5 Days Not Complete  Not Complete comments pt not stable for dc at this time  HRI or Home Care Consult Complete  Social Work Consult for Recovery Care Planning/Counseling Complete  Palliative Care Screening Not Applicable  Medication Review Oceanographer) Referral to Pharmacy  Some recent data might be hidden

## 2019-01-15 NOTE — Progress Notes (Signed)
PROGRESS NOTE    Roberto [name]  BMW:[id number] DOB: 1965/01/29 DOA: 12/19/2018 PCP: Denita Lung, MD Outpatient Specialists:    Brief Narrative:   Pt. With history of NICM, EF 25-30%, PSVT, PAF, chronic systolic HF with AICD, hx of DVT/PE/LV thrombus , on chronic anticoagulation, was admitted 12/20/2018 for acute diverticulitis. He was initially treated conservatively, but had no real improvement and was taken to the OR 12/28/2018. Pt had Left colonic stricture of unknown etiology, and perforated cecum. Per surgery's note there was a mass in the mid descending colon (pathology negative for malignancy). The right colon was ischemic and had perforated. There was visualization of leakage of a large amount of stool throughout his entire abdominal cavity. In OR patient became hypotensive, required pressors (Phenylephrine and Levo). Surgery felt the patient was not ready for extubation, and considering co morbidities and possible septic insult asked PCCM to admit to ICU manage care. Transferred to [facility] service on 01/03/2019 now off pressors.   01/14/19: still improving, tolerating diet, plan repeat CT once drain's output minimal, IR following  Today 01/15/19: stable and doing well, we are waiting on SNF bed offer and still planning to repeat CT Abd per surgery     Assessment & Plan:   Principal Problem:   Acute diverticulitis Active Problems:   Obesity, morbid ([place])   Nonischemic cardiomyopathy ([place])   Paroxysmal SVT (supraventricular tachycardia) (HCC)   Chronic systolic heart failure (HCC)   Chronic pain of right knee   History of DVT (deep vein thrombosis)   PAF (paroxysmal atrial fibrillation) ([place])   Pre-operative cardiovascular examination   Bowel obstruction (HCC)   Respiratory failure ([place])   Acute on chronic systolic heart failure (HCC)   Septic shock secondary to perforated cecum and contamination of abdominal cavity with stool s/p subtotal colectomy and  ileostomy- dehisced abdominal wound. Wet-to-dry dressing changes twice a day per surgery. TPNstopped12/30/2020.  Patient tolerating p.o. intake. afebrile,s/p abscess drain, patient tolerating abdominal binder.  Patient was treated with Zosyn and Diflucan which has been stopped 01/09/2019 Peritoneal abscess status post CT-guided drainage by interventional radiologist on 01/04/2019, Dr. Kathlene Cote. Management per interventional radiology; appreciate assistance Plan to repeat CT of the abdomen once output is less than 10 cc.  Acute hypoxic respiratory failure possibly secondary to postop atelectasis Normal oxygen supplementation at baseline  on RA; stable  Chronic combined systolic and diastolic CHF negative by 2.7 L. Last 2D echo LVEF 25 to 30% Status post AICD watch daily wts;WEIGHT145 up from144.63from 148.  willstartentresto when stabilizes Cardiology restarted aldactone and low dose torsemide today  Paroxysmal A. fib History of DVT/PE/LV thrombus continue digoxin, amiodarone Cardiology following intermittently Eliquis restarted 01/10/2019.  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 Patient not a candidate for CIR at this time.  Consultants:   Surgery  Cardiology - following intermittently  radiology  Procedures:   01/04/19: CT Guided Drainage ofPeritoneal Abscess  12/28/18: Exploratory laparotomy, Subtotal colectomy, End ileostomy for Left colonic stricture of unknown etiology, perforated cecum  12/23/18: Flexible Sigmoidoscopy      Antimicrobials:  Anti-infectives (From admission, onward)   Start     Dose/Rate Route Frequency Ordered Stop   12/28/18 1400  fluconazole (DIFLUCAN) IVPB 400 mg     400 mg 100 mL/hr over 120  Minutes Intravenous Every 24 hours 12/28/18 1318 01/09/19 2359   12/20/18 2200  piperacillin-tazobactam (ZOSYN) IVPB 3.375 g     3.375 g 12.5 mL/hr over 240 Minutes Intravenous Every 8 hours 12/20/18 1834 01/09/19 2359   12/20/18 0645  piperacillin-tazobactam (ZOSYN) IVPB 3.375 g  Status:  Discontinued     3.375 g 100 mL/hr over 30 Minutes Intravenous Every 6 hours 12/20/18 0630 12/20/18 1833   12/19/18 2315  piperacillin-tazobactam (ZOSYN) IVPB 3.375 g     3.375 g 100 mL/hr over 30 Minutes Intravenous  Once 12/19/18 2302 12/20/18 0003       Subjective: Patient reports feeling okay, recovering well     Objective: Vitals:   01/15/19 0357 01/15/19 0829 01/15/19 0922 01/15/19 1159  BP: (!) 93/50 102/68  106/63  Pulse: 77 73 73 76  Resp: 18   20  Temp: 98.4 F (36.9 C) 98.1 F (36.7 C)    TempSrc: Oral Oral  Oral  SpO2: 100% 97%  96%  Weight: (!) 139.1 kg     Height:        Intake/Output Summary (Last 24 hours) at 01/15/2019 1644 Last data filed at 01/15/2019 1100 Gross per 24 hour  Intake 980 ml  Output 1887 ml  Net -907 ml   Filed Weights   01/13/19 0535 01/14/19 0630 01/15/19 0357  Weight: (!) 145.3 kg (!) 143.2 kg (!) 139.1 kg    Examination:  General exam: Appears calm and comfortable  Respiratory system: Clear to auscultation. Respiratory effort normal. Cardiovascular system: S1 & S2 heard, RRR. No JVD, murmurs, rubs, gallops or clicks. No pedal edema. Gastrointestinal system: Abdomen is nondistended, soft and nontender. No organomegaly or masses felt. Exam limited d/t habitus and in binder  Central nervous system: Alert and oriented. No focal neurological deficits. Extremities: Symmetric movement  Skin: No rashes, lesions or ulcers Psychiatry: Judgement and insight appear normal. Mood & affect appropriate.     Data Reviewed: I have personally reviewed following labs and imaging studies  CBC: Recent Labs  Lab 01/10/19 0441 01/11/19 0500 01/12/19 0500  01/13/19 0500 01/14/19 0558 01/15/19 0415  WBC 15.7* 15.3* 12.8* 11.0* 11.0* 10.5  NEUTROABS 13.0*  --   --   --   --   --   HGB 9.5* 9.3* 9.0* 9.7* 9.6* 9.6*  HCT 30.0* 29.4* 28.7* 30.3* 31.1* 29.8*  MCV 89.8 90.7 91.7 91.3 92.0 89.0  PLT 276 254 274 305 299 327   Basic Metabolic Panel: Recent Labs  Lab 01/09/19 0500 01/10/19 0441 01/10/19 0442 01/11/19 0500 01/13/19 0500 01/14/19 0558  NA 132*  --  134* 136 134* 136  K 4.4  --  5.0 4.9 4.7 4.4  CL 96*  --  99 102 101 102  CO2 25  --  25 26 25 25   GLUCOSE 261*  --  196* 150* 140* 112*  BUN 23*  --  20 19 19 20   CREATININE 1.23  --  1.34* 1.20 1.09 1.02  CALCIUM 7.1*  --  7.6* 8.0* 8.0* 8.1*  MG 2.2 2.3  --  2.1 1.8  --   PHOS 3.3  --  3.4  --  4.2  --    GFR: Estimated Creatinine Clearance: 112.9 mL/min (by C-G formula based on SCr of 1.02 mg/dL). Liver Function Tests: Recent Labs  Lab 01/09/19 0500 01/10/19 0442 01/11/19 0500 01/13/19 0500  AST  --   --  22 23  ALT  --   --  14 18  ALKPHOS  --   --  59 65  BILITOT  --   --  0.4 0.5  PROT  --   --  6.4* 6.3*  ALBUMIN 1.1* 1.2* 1.3* 1.4*   No results for input(s): LIPASE, AMYLASE in the last 168 hours. No results for input(s): AMMONIA in the last 168 hours. Coagulation Profile: No results for input(s): INR, PROTIME in the last 168 hours. Cardiac Enzymes: No results for input(s): CKTOTAL, CKMB, CKMBINDEX, TROPONINI in the last 168 hours. BNP (last 3 results) No results for input(s): PROBNP in the last 8760 hours. HbA1C: No results for input(s): HGBA1C in the last 72 hours. CBG: Recent Labs  Lab 01/14/19 1144 01/14/19 1734 01/14/19 2107 01/15/19 0734 01/15/19 1157  GLUCAP 130* 116* 135* 106* 150*   Lipid Profile: No results for input(s): CHOL, HDL, LDLCALC, TRIG, CHOLHDL, LDLDIRECT in the last 72 hours. Thyroid Function Tests: No results for input(s): TSH, T4TOTAL, FREET4, T3FREE, THYROIDAB in the last 72 hours. Anemia Panel: No results for  input(s): VITAMINB12, FOLATE, FERRITIN, TIBC, IRON, RETICCTPCT in the last 72 hours. Urine analysis:    Component Value Date/Time   COLORURINE YELLOW 12/19/2018 2038   APPEARANCEUR CLEAR 12/19/2018 2038   LABSPEC >1.030 (H) 12/19/2018 2038   LABSPEC 1.025 03/18/2017 1337   PHURINE 5.5 12/19/2018 2038   GLUCOSEU NEGATIVE 12/19/2018 2038   HGBUR MODERATE (A) 12/19/2018 2038   BILIRUBINUR SMALL (A) 12/19/2018 2038   BILIRUBINUR negative 03/18/2017 1337   BILIRUBINUR neg 12/17/2015 0944   KETONESUR NEGATIVE 12/19/2018 2038   PROTEINUR >300 (A) 12/19/2018 2038   UROBILINOGEN negative 12/17/2015 0944   UROBILINOGEN 0.2 12/16/2011 1208   NITRITE NEGATIVE 12/19/2018 2038   LEUKOCYTESUR NEGATIVE 12/19/2018 2038   Sepsis Labs: @LABRCNTIP (procalcitonin:4,lacticidven:4)  No results found for this or any previous visit (from the past 240 hour(s)).       Radiology Studies last 96 hours: No results found.       Scheduled Meds: . acetaminophen  650 mg Oral Q6H  . amiodarone  200 mg Oral Daily  . apixaban  5 mg Oral BID  . chlorhexidine  15 mL Mouth Rinse BID  . Chlorhexidine Gluconate Cloth  6 each Topical Daily  . digoxin  0.125 mg Oral Daily  . feeding supplement (ENSURE ENLIVE)  237 mL Oral TID BM  . feeding supplement (PRO-STAT SUGAR FREE 64)  30 mL Oral TID BM  . insulin aspart  0-20 Units Subcutaneous TID WC  . insulin aspart  0-5 Units Subcutaneous QHS  . mouth rinse  15 mL Mouth Rinse q12n4p  . methocarbamol  500 mg Oral TID  . multivitamin with minerals  1 tablet Oral Daily  . pantoprazole  40 mg Oral Q1200  . sodium chloride flush  10-40 mL Intracatheter Q12H  . sodium chloride flush  5 mL Intracatheter Q8H  . spironolactone  12.5 mg Oral Daily  . torsemide  20 mg Oral Daily   Continuous Infusions: . sodium chloride 10 mL/hr at 01/09/19 1800  . sodium chloride       LOS: 26 days    Time spent: 25 mins    01/11/19, DO [facility]  If  7PM-7AM, please contact night-coverage [email or web address] 01/15/2019, 4:44 PM

## 2019-01-15 NOTE — Progress Notes (Signed)
Progress Note  Patient Name: Roberto [name] Date of Encounter: 01/15/2019  Primary Cardiologist: Glori Bickers, MD    Patient Profile     54 y.o. male with a hx of chronic diastolic HF,h/o ofLV thrombus, nonischemic cardiomyopathy (EF 25-30% 2013, EF 2020 25-30%), ICD placed 08/2018, PSVT (elected for medical therapy over RFA), DVT/PE, Paroxysmal afib on amiodarone, Aflutter ablation 11/2016, lupus, and morbid obesityinitially seen in preop consultation early 12/20 for diverticulitis   It did not improve with medical therapy and went to OR 12/15 and found to have non malignant mass and perforated cecum and abscess which required drainage-still present 1/1  OR course cx by hypotension requiring pressors >>ICU w dx of sepsis.  Now off Abx.  BP still low(1/1)  anticoagulation w apixoban   Subjective   Feels good without sob or chest pain.  Inpatient Medications    Scheduled Meds: . acetaminophen  650 mg Oral Q6H  . amiodarone  200 mg Oral Daily  . apixaban  5 mg Oral BID  . chlorhexidine  15 mL Mouth Rinse BID  . Chlorhexidine Gluconate Cloth  6 each Topical Daily  . digoxin  0.125 mg Oral Daily  . feeding supplement (ENSURE ENLIVE)  237 mL Oral TID BM  . feeding supplement (PRO-STAT SUGAR FREE 64)  30 mL Oral TID BM  . insulin aspart  0-20 Units Subcutaneous TID WC  . insulin aspart  0-5 Units Subcutaneous QHS  . mouth rinse  15 mL Mouth Rinse q12n4p  . methocarbamol  500 mg Oral TID  . multivitamin with minerals  1 tablet Oral Daily  . pantoprazole  40 mg Oral Q1200  . sodium chloride flush  10-40 mL Intracatheter Q12H  . sodium chloride flush  5 mL Intracatheter Q8H  . spironolactone  12.5 mg Oral Daily  . torsemide  20 mg Oral Daily   Continuous Infusions: . sodium chloride 10 mL/hr at 01/09/19 1800  . sodium chloride     PRN Meds: sodium chloride, alum & mag hydroxide-simeth, morphine injection, ondansetron (ZOFRAN) IV, oxyCODONE, sodium chloride flush   Vital  Signs    Vitals:   01/14/19 2355 01/15/19 0357 01/15/19 0829 01/15/19 0922  BP: 97/64 (!) 93/50 102/68   Pulse: 74 77 73 73  Resp:  18    Temp: 98.4 F (36.9 C) 98.4 F (36.9 C) 98.1 F (36.7 C)   TempSrc: Oral Oral Oral   SpO2: 96% 100% 97%   Weight:  (!) 139.1 kg    Height:        Intake/Output Summary (Last 24 hours) at 01/15/2019 0958 Last data filed at 01/15/2019 0830 Gross per 24 hour  Intake 740 ml  Output 3587 ml  Net -2847 ml   Filed Weights   01/13/19 0535 01/14/19 0630 01/15/19 0357  Weight: (!) 145.3 kg (!) 143.2 kg (!) 139.1 kg   Telemetry    Sinus with frequent PVCs aout 5% - Personally Reviewed  Physical Exam  BP 102/68 (BP Location: Left Arm)   Pulse 73   Temp 98.1 F (36.7 C) (Oral)   Resp 18   Ht 5\' 7"  (1.702 m)   Wt (!) 139.1 kg   SpO2 97%   BMI 48.03 kg/m  Well developed and Morbidly obese in no acute distress HENT normal Neck supple with JVP-  flat   Clear Regular rate and rhythm, no murmurs or gallops Abd-soft with active BS No Clubbing cyanosis B edema 1-2+ edema Skin-warm and dry A &  Oriented  Grossly normal sensory and motor function  Labs    Chemistry Recent Labs  Lab 01/10/19 0442 01/11/19 0500 01/13/19 0500 01/14/19 0558  NA 134* 136 134* 136  K 5.0 4.9 4.7 4.4  CL 99 102 101 102  CO2 25 26 25 25   GLUCOSE 196* 150* 140* 112*  BUN 20 19 19 20   CREATININE 1.34* 1.20 1.09 1.02  CALCIUM 7.6* 8.0* 8.0* 8.1*  PROT  --  6.4* 6.3*  --   ALBUMIN 1.2* 1.3* 1.4*  --   AST  --  22 23  --   ALT  --  14 18  --   ALKPHOS  --  59 65  --   BILITOT  --  0.4 0.5  --   GFRNONAA >60 >60 >60 >60  GFRAA >60 >60 >60 >60  ANIONGAP 10 8 8 9      Hematology Recent Labs  Lab 01/13/19 0500 01/14/19 0558 01/15/19 0415  WBC 11.0* 11.0* 10.5  RBC 3.32* 3.38* 3.35*  HGB 9.7* 9.6* 9.6*  HCT 30.3* 31.1* 29.8*  MCV 91.3 92.0 89.0  MCH 29.2 28.4 28.7  MCHC 32.0 30.9 32.2  RDW 15.5 15.2 14.7  PLT 305 299 327   Cardiac EnzymesNo results  for input(s): TROPONINI in the last 168 hours. No results for input(s): TROPIPOC in the last 168 hours.   BNPNo results for input(s): BNP, PROBNP in the last 168 hours.   DDimer No results for input(s): DDIMER in the last 168 hours.   Radiology    No results found.  Cardiac Studies   Echocardiogram 07/20/2018 IMPRESSIONS  1. The left ventricle has severely reduced systolic function, with an ejection fraction of 25-30%. The cavity size was moderately dilated. Left ventricular diastolic Doppler parameters are consistent with pseudonormalization. Left ventricular diffuse  hypokinesis. 2. The right ventricle has normal systolic function. The cavity was normal. There is no increase in right ventricular wall thickness. 3. Left atrial size was mildly dilated. 4. No evidence of mitral valve stenosis. Trivial mitral regurgitation. 5. The aortic valve is tricuspid. No stenosis of the aortic valve. 6. The aortic root is normal in size and structure. 7. The inferior vena cava was dilated in size with <50% respiratory variability. No complete TR doppler jet so unable to estimate PA systolic pressure.    Assessment & Plan    Acute diverticulitis/Colon Mass/ Sepsis Acute on chronic systolic HF (LVEF 25-30%) with volume overload BP low  Morbid obesity with BMI 54. ICD in place  Paroxysmal Afib  Diuretics/ afterload have been on hold in setting of sepsis Aldactone and low dose torsemide were resumed yesterday with excellent diuresis - 2.8 L, weight down 6 kg, Crea is stable at 1.02, I will continue the same regimen.  Signed, 03/14/19, MD  01/15/2019, 9:58 AM

## 2019-01-15 NOTE — Progress Notes (Signed)
18 Days Post-Op   Subjective/Chief Complaint: Feeling a little better   Objective: Vital signs in last 24 hours: Temp:  [98.1 F (36.7 C)-99 F (37.2 C)] 98.1 F (36.7 C) (01/02 0829) Pulse Rate:  [73-77] 73 (01/02 0829) Resp:  [17-19] 18 (01/02 0357) BP: (92-102)/(50-68) 102/68 (01/02 0829) SpO2:  [95 %-100 %] 97 % (01/02 0829) Weight:  [139.1 kg] 139.1 kg (01/02 0357) Last BM Date: 01/14/19  Intake/Output from previous day: 01/01 0701 - 01/02 0700 In: 500 [P.O.:500] Out: 3587 [Urine:2975; Drains:37; Stool:575] Intake/Output this shift: Total I/O In: 240 [P.O.:240] Out: -   General appearance: cooperative Resp: clear to auscultation bilaterally GI: large wound with wet to dry and adaptic, fibrinous base  Lab Results:  Recent Labs    01/14/19 0558 01/15/19 0415  WBC 11.0* 10.5  HGB 9.6* 9.6*  HCT 31.1* 29.8*  PLT 299 327   BMET Recent Labs    01/13/19 0500 01/14/19 0558  NA 134* 136  K 4.7 4.4  CL 101 102  CO2 25 25  GLUCOSE 140* 112*  BUN 19 20  CREATININE 1.09 1.02  CALCIUM 8.0* 8.1*   PT/INR No results for input(s): LABPROT, INR in the last 72 hours. ABG No results for input(s): PHART, HCO3 in the last 72 hours.  Invalid input(s): PCO2, PO2  Studies/Results: No results found.  Anti-infectives: Anti-infectives (From admission, onward)   Start     Dose/Rate Route Frequency Ordered Stop   12/28/18 1400  fluconazole (DIFLUCAN) IVPB 400 mg     400 mg 100 mL/hr over 120 Minutes Intravenous Every 24 hours 12/28/18 1318 01/09/19 2359   12/20/18 2200  piperacillin-tazobactam (ZOSYN) IVPB 3.375 g     3.375 g 12.5 mL/hr over 240 Minutes Intravenous Every 8 hours 12/20/18 1834 01/09/19 2359   12/20/18 0645  piperacillin-tazobactam (ZOSYN) IVPB 3.375 g  Status:  Discontinued     3.375 g 100 mL/hr over 30 Minutes Intravenous Every 6 hours 12/20/18 0630 12/20/18 1833   12/19/18 2315  piperacillin-tazobactam (ZOSYN) IVPB 3.375 g     3.375 g 100 mL/hr  over 30 Minutes Intravenous  Once 12/19/18 2302 12/20/18 0003      Assessment/Plan: Left colonic stricture of unknown etiology; with perforated cecum POD#17STC/ileostomy fordiverticular stricture of left colon and perforated R colon12/15/2020,Dr. Emelia Loron - pathwithout evidence of malignancy -s/p drain placement 12/22 - cxsE coli pansensitive,abx stopped 12/27. Plan repeat CT once output minimal and it is decreasing, IR following -ileostomy functioning, tolerating a diet.Prealbumin improving but still low (12.5).TPN off - wound dehisced, monitor closely. BID wet to dry dressing changes with adaptic in the base - abdominal binder - oob, should be ready for SNF placement  FEN:cm diet and supplements ZTI:[id number]>> 12/28 ID: IV Zosyn 12/6>>12/27, IV diflucan 12/15>>12/27 Follow up:  Dr. Dwain Sarna   LOS: 26 days    Liz Malady 01/15/2019

## 2019-01-16 LAB — CBC
HCT: 29.9 % — ABNORMAL LOW (ref 39.0–52.0)
Hemoglobin: 9.5 g/dL — ABNORMAL LOW (ref 13.0–17.0)
MCH: 28.3 pg (ref 26.0–34.0)
MCHC: 31.8 g/dL (ref 30.0–36.0)
MCV: 89 fL (ref 80.0–100.0)
Platelets: 317 10*3/uL (ref 150–400)
RBC: 3.36 MIL/uL — ABNORMAL LOW (ref 4.22–5.81)
RDW: 14.6 % (ref 11.5–15.5)
WBC: 10 10*3/uL (ref 4.0–10.5)
nRBC: 0 % (ref 0.0–0.2)

## 2019-01-16 LAB — GLUCOSE, CAPILLARY
Glucose-Capillary: 104 mg/dL — ABNORMAL HIGH (ref 70–99)
Glucose-Capillary: 192 mg/dL — ABNORMAL HIGH (ref 70–99)
Glucose-Capillary: 86 mg/dL (ref 70–99)
Glucose-Capillary: 151 mg/dL — ABNORMAL HIGH (ref 70–99)

## 2019-01-16 LAB — BASIC METABOLIC PANEL
Anion gap: 10 (ref 5–15)
BUN: 19 mg/dL (ref 6–20)
CO2: 25 mmol/L (ref 22–32)
Calcium: 7.9 mg/dL — ABNORMAL LOW (ref 8.9–10.3)
Chloride: 100 mmol/L (ref 98–111)
Creatinine, Ser: 1.19 mg/dL (ref 0.61–1.24)
GFR calc Af Amer: >60 mL/min (ref >60)
GFR calc non Af Amer: >60 mL/min (ref >60)
Glucose, Bld: 148 mg/dL — ABNORMAL HIGH (ref 70–99)
Potassium: 3.9 mmol/L (ref 3.5–5.1)
Sodium: 135 mmol/L (ref 135–145)

## 2019-01-16 NOTE — Progress Notes (Signed)
19 Days Post-Op   Subjective/Chief Complaint: Feeling better each day   Objective: Vital signs in last 24 hours: Temp:  [97.8 F (36.6 C)-98 F (36.7 C)] 97.8 F (36.6 C) (01/03 0739) Pulse Rate:  [73-76] 75 (01/03 0739) Resp:  [16-22] 22 (01/03 0739) BP: (91-109)/(44-64) 97/54 (01/03 0739) SpO2:  [91 %-96 %] 91 % (01/03 0739) Weight:  [139.5 kg] 139.5 kg (01/03 0554) Last BM Date: 01/15/19  Intake/Output from previous day: 01/02 0701 - 01/03 0700 In: 725 [P.O.:720; I.V.:5] Out: 750 [Urine:750] Intake/Output this shift: No intake/output data recorded.  General appearance: cooperative Resp: non-labored  GI: large wound with wet to dry and adaptic, fibrinous base  Lab Results:  Recent Labs    01/15/19 0415 01/16/19 0615  WBC 10.5 10.0  HGB 9.6* 9.5*  HCT 29.8* 29.9*  PLT 327 317   BMET Recent Labs    01/14/19 0558  NA 136  K 4.4  CL 102  CO2 25  GLUCOSE 112*  BUN 20  CREATININE 1.02  CALCIUM 8.1*   PT/INR No results for input(s): LABPROT, INR in the last 72 hours. ABG No results for input(s): PHART, HCO3 in the last 72 hours.  Invalid input(s): PCO2, PO2  Studies/Results: No results found.  Anti-infectives: Anti-infectives (From admission, onward)   Start     Dose/Rate Route Frequency Ordered Stop   12/28/18 1400  fluconazole (DIFLUCAN) IVPB 400 mg     400 mg 100 mL/hr over 120 Minutes Intravenous Every 24 hours 12/28/18 1318 01/09/19 2359   12/20/18 2200  piperacillin-tazobactam (ZOSYN) IVPB 3.375 g     3.375 g 12.5 mL/hr over 240 Minutes Intravenous Every 8 hours 12/20/18 1834 01/09/19 2359   12/20/18 0645  piperacillin-tazobactam (ZOSYN) IVPB 3.375 g  Status:  Discontinued     3.375 g 100 mL/hr over 30 Minutes Intravenous Every 6 hours 12/20/18 0630 12/20/18 1833   12/19/18 2315  piperacillin-tazobactam (ZOSYN) IVPB 3.375 g     3.375 g 100 mL/hr over 30 Minutes Intravenous  Once 12/19/18 2302 12/20/18 0003      Assessment/Plan: Left  colonic stricture of unknown etiology; with perforated cecum POD#19STC/ileostomy fordiverticular stricture of left colon and perforated R colon12/15/2020,Dr. Emelia Loron - pathwithout evidence of malignancy -s/p drain placement 12/22 - cxsE coli pansensitive,abx stopped 12/27. Plan repeat CT once output minimal and it is decreasing, IR following.  Output not being recorded but there is still quite a bit in the bulb -ileostomy functioning, tolerating a diet.Prealbumin improving.TPN off - wound dehisced, monitor closely. BID wet to dry dressing changes with adaptic in the base - abdominal binder - oob, should be ready for SNF placement when bed available  FEN:cm diet and supplements YPP:[id number]>> 12/28 ID: IV Zosyn 12/6>>12/27, IV diflucan 12/15>>12/27 Follow up:  Dr. Dwain Sarna   LOS: 27 days    Roberto [name] 01/16/2019

## 2019-01-16 NOTE — Progress Notes (Signed)
ANTICOAGULATION FOLLOW-UP CONSULT NOTE  Pharmacy Consult for  Eliquis Indication: hx VTE, LV mural thrombus, afib   Allergies  Allergen Reactions  . Shrimp [Shellfish Allergy] Hives and Itching    Patient Measurements: Height: 5\' 7"  (170.2 cm) Weight: (!) 307 lb 8 oz (139.5 kg) IBW/kg (Calculated) : 66.1 Heparin Dosing Weight:  101.6 kg  Vital Signs: Temp: 97.8 F (36.6 C) (01/03 0739) Temp Source: Axillary (01/03 0739) BP: 97/54 (01/03 0739) Pulse Rate: 75 (01/03 0739)  Labs: Recent Labs    01/14/19 0558 01/15/19 0415 01/16/19 0615  HGB 9.6* 9.6* 9.5*  HCT 31.1* 29.8* 29.9*  PLT 299 327 317  CREATININE 1.02  --   --     Estimated Creatinine Clearance: 113.1 mL/min (by C-G formula based on SCr of 1.02 mg/dL).   Assessment: 54 y.o. male with h/o Afib and LV thrombus, Eliquis held initially but has been restarted.  No overt bleeding noted.  No changes to regimen anticipated. Pharmacy will sign off and follow peripherally. Please enter new consult if change in anticoagulation plan.   Goal of Therapy:  Monitor platelets by anticoagulation protocol: Yes   Plan:  Continue Eliquis 5 mg PO BID Watch CBC   40, PharmD PGY1 Ambulatory Care Pharmacy Resident Please utilize Amion for appropriate phone number to reach the unit pharmacist [facility])  01/16/2019 8:43 AM

## 2019-01-16 NOTE — Progress Notes (Signed)
Progress Note  Patient Name: Roberto [name] Date of Encounter: 01/16/2019  Primary Cardiologist: Glori Bickers, MD    Patient Profile     54 y.o. male with a hx of chronic diastolic HF,h/o ofLV thrombus, nonischemic cardiomyopathy (EF 25-30% 2013, EF 2020 25-30%), ICD placed 08/2018, PSVT (elected for medical therapy over RFA), DVT/PE, Paroxysmal afib on amiodarone, Aflutter ablation 11/2016, lupus, and morbid obesityinitially seen in preop consultation early 12/20 for diverticulitis   It did not improve with medical therapy and went to OR 12/15 and found to have non malignant mass and perforated cecum and abscess which required drainage-still present 1/1  OR course cx by hypotension requiring pressors >>ICU w dx of sepsis.  Now off Abx.  BP still low(1/1)  anticoagulation w apixoban   Subjective   Improvement in SOB, no chest pain.  Inpatient Medications    Scheduled Meds: . acetaminophen  650 mg Oral Q6H  . amiodarone  200 mg Oral Daily  . apixaban  5 mg Oral BID  . chlorhexidine  15 mL Mouth Rinse BID  . Chlorhexidine Gluconate Cloth  6 each Topical Daily  . digoxin  0.125 mg Oral Daily  . feeding supplement (ENSURE ENLIVE)  237 mL Oral TID BM  . feeding supplement (PRO-STAT SUGAR FREE 64)  30 mL Oral TID BM  . insulin aspart  0-20 Units Subcutaneous TID WC  . insulin aspart  0-5 Units Subcutaneous QHS  . mouth rinse  15 mL Mouth Rinse q12n4p  . methocarbamol  500 mg Oral TID  . multivitamin with minerals  1 tablet Oral Daily  . pantoprazole  40 mg Oral Q1200  . sodium chloride flush  10-40 mL Intracatheter Q12H  . sodium chloride flush  5 mL Intracatheter Q8H  . spironolactone  12.5 mg Oral Daily  . torsemide  20 mg Oral Daily   Continuous Infusions: . sodium chloride 10 mL/hr at 01/09/19 1800  . sodium chloride     PRN Meds: sodium chloride, alum & mag hydroxide-simeth, morphine injection, ondansetron (ZOFRAN) IV, oxyCODONE, sodium chloride flush   Vital  Signs    Vitals:   01/15/19 2042 01/16/19 0535 01/16/19 0554 01/16/19 0739  BP: (!) 91/57 (!) 98/44 (!) 91/53 (!) 97/54  Pulse: 76 74 73 75  Resp: 19 (!) 21 16 (!) 22  Temp: 98 F (36.7 C)  97.8 F (36.6 C) 97.8 F (36.6 C)  TempSrc: Oral  Axillary Axillary  SpO2:  93% 92% 91%  Weight:   (!) 139.5 kg   Height:        Intake/Output Summary (Last 24 hours) at 01/16/2019 0902 Last data filed at 01/16/2019 0500 Gross per 24 hour  Intake 485 ml  Output 750 ml  Net -265 ml   Filed Weights   01/14/19 0630 01/15/19 0357 01/16/19 0554  Weight: (!) 143.2 kg (!) 139.1 kg (!) 139.5 kg   Telemetry    Sinus with frequent PVCs aout 5% - Personally Reviewed  Physical Exam  BP (!) 97/54 (BP Location: Left Arm)   Pulse 75   Temp 97.8 F (36.6 C) (Axillary)   Resp (!) 22   Ht 5\' 7"  (1.702 m)   Wt (!) 139.5 kg   SpO2 91%   BMI 48.16 kg/m  Well developed and Morbidly obese in no acute distress HENT normal Neck supple with JVP-  flat   Clear Regular rate and rhythm, no murmurs or gallops Abd-soft with active BS No Clubbing cyanosis B edema  1-2+ edema Skin-warm and dry A & Oriented  Grossly normal sensory and motor function  Labs    Chemistry Recent Labs  Lab 01/10/19 0442 01/11/19 0500 01/13/19 0500 01/14/19 0558  NA 134* 136 134* 136  K 5.0 4.9 4.7 4.4  CL 99 102 101 102  CO2 25 26 25 25   GLUCOSE 196* 150* 140* 112*  BUN 20 19 19 20   CREATININE 1.34* 1.20 1.09 1.02  CALCIUM 7.6* 8.0* 8.0* 8.1*  PROT  --  6.4* 6.3*  --   ALBUMIN 1.2* 1.3* 1.4*  --   AST  --  22 23  --   ALT  --  14 18  --   ALKPHOS  --  59 65  --   BILITOT  --  0.4 0.5  --   GFRNONAA >60 >60 >60 >60  GFRAA >60 >60 >60 >60  ANIONGAP 10 8 8 9      Hematology Recent Labs  Lab 01/14/19 0558 01/15/19 0415 01/16/19 0615  WBC 11.0* 10.5 10.0  RBC 3.38* 3.35* 3.36*  HGB 9.6* 9.6* 9.5*  HCT 31.1* 29.8* 29.9*  MCV 92.0 89.0 89.0  MCH 28.4 28.7 28.3  MCHC 30.9 32.2 31.8  RDW 15.2 14.7 14.6    PLT 299 327 317   Cardiac EnzymesNo results for input(s): TROPONINI in the last 168 hours. No results for input(s): TROPIPOC in the last 168 hours.   BNPNo results for input(s): BNP, PROBNP in the last 168 hours.   DDimer No results for input(s): DDIMER in the last 168 hours.   Radiology    No results found.  Cardiac Studies   Echocardiogram 07/20/2018 IMPRESSIONS  1. The left ventricle has severely reduced systolic function, with an ejection fraction of 25-30%. The cavity size was moderately dilated. Left ventricular diastolic Doppler parameters are consistent with pseudonormalization. Left ventricular diffuse  hypokinesis. 2. The right ventricle has normal systolic function. The cavity was normal. There is no increase in right ventricular wall thickness. 3. Left atrial size was mildly dilated. 4. No evidence of mitral valve stenosis. Trivial mitral regurgitation. 5. The aortic valve is tricuspid. No stenosis of the aortic valve. 6. The aortic root is normal in size and structure. 7. The inferior vena cava was dilated in size with <50% respiratory variability. No complete TR doppler jet so unable to estimate PA systolic pressure.    Assessment & Plan    Acute diverticulitis/Colon Mass/ Sepsis Acute on chronic systolic HF (LVEF 25-30%) with volume overload BP low  Morbid obesity with BMI 54. ICD in place  Paroxysmal Afib  Diuretics/ afterload have been on hold in setting of sepsis, restarted on 1/1 with Aldactone and low dose torsemide with excellent diuresis - 2.8 L, weight down 6 kg, now only 260 cc and hypotension, I would place aldactone on hold. Crea not checked since 1/1, I will order.  Signed, 03/15/19, MD  01/16/2019, 9:02 AM

## 2019-01-16 NOTE — Progress Notes (Addendum)
PROGRESS NOTE    Roberto [name]  LFY:[id number] DOB: February 02, 1965 DOA: 12/19/2018 PCP: [name], MD Outpatient Specialists:    Brief Narrative:   Pt. With history of NICM, EF 25-30%, PSVT, PAF, chronic systolic HF with AICD, hx of DVT/PE/LV thrombus , on chronic anticoagulation, was admitted 12/20/2018 for acute diverticulitis. He was initially treated conservatively, but had no real improvement and was taken to the OR 12/28/2018. Pt had Left colonic stricture of unknown etiology, and perforated cecum. Per surgery's note there was a mass in the mid descending colon (pathology negative for malignancy). The right colon was ischemic and had perforated. There was visualization of leakage of a large amount of stool throughout his entire abdominal cavity. In OR patient became hypotensive, required pressors (Phenylephrine and Levo). Surgery felt the patient was not ready for extubation, and considering co morbidities and possible septic insult asked PCCM to admit to ICU manage care. Transferred to [facility] service on 01/03/2019 now off pressors.    Today 01/16/19: stable and doing well, we are waiting on SNF bed offer and still planning to repeat CT Abd per surgery, they report today that brain bulbs are still pretty full.     Assessment & Plan:   Principal Problem:   Acute diverticulitis Active Problems:   Obesity, morbid (HCC)   Nonischemic cardiomyopathy (HCC)   Paroxysmal SVT (supraventricular tachycardia) (HCC)   Chronic systolic heart failure (HCC)   Chronic pain of right knee   History of DVT (deep vein thrombosis)   PAF (paroxysmal atrial fibrillation) (HCC)   Pre-operative cardiovascular examination   Bowel obstruction (HCC)   Respiratory failure (HCC)   Acute on chronic systolic heart failure (HCC)   Septic shock secondary to perforated cecum and contamination of abdominal cavity with stool s/p subtotal colectomy and ileostomy-  Peritoneal abscess status post CT-guided  drainage by interventional radiologist on 01/04/2019, Dr. Fredia Sorrow. Management per interventional radiology; appreciate assistance  Plan to repeat CT of the abdomen once output is less than 10 cc.  dehisced abdominal wound. Wet-to-dry dressing changes twice a day per surgery.  TPNstopped12/30/2020.   Patient tolerating p.o. intake.  afebrile,s/p abscess drain, patient tolerating abdominal binder.   Patient was treated with Zosyn and Diflucan which has been stopped as of 01/09/2019  Acute hypoxic respiratory failure possibly secondary to postop atelectasis  Normal oxygen supplementation at baseline   on RA now; stable  Chronic combined systolic and diastolic CHF  negative by 2.7 L.  Last 2D echo LVEF 25 to 30%  Status post AICD  watch daily wts  willstartentresto when stabilizes  Cardiology restarted aldactone and low dose torsemide yesterday, holding aldactone now given hypotension   Paroxysmal A. fib History of DVT/PE/LV thrombus  continue digoxin, amiodarone  Cardiology following   Eliquis restarted 01/10/2019.  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 Patient not a candidate for CIR at this time.  Consultants:   Surgery  Cardiology - following intermittently  radiology  Procedures:   01/04/19: CT Guided Drainage ofPeritoneal Abscess  12/28/18: Exploratory laparotomy, Subtotal colectomy, End ileostomy for Left colonic stricture of unknown etiology, perforated cecum  12/23/18: Flexible Sigmoidoscopy      Antimicrobials:  Anti-infectives (From admission, onward)   Start     Dose/Rate Route Frequency Ordered Stop  12/28/18 1400  fluconazole (DIFLUCAN) IVPB 400 mg     400 mg 100 mL/hr over 120 Minutes Intravenous  Every 24 hours 12/28/18 1318 01/09/19 2359   12/20/18 2200  piperacillin-tazobactam (ZOSYN) IVPB 3.375 g     3.375 g 12.5 mL/hr over 240 Minutes Intravenous Every 8 hours 12/20/18 1834 01/09/19 2359   12/20/18 0645  piperacillin-tazobactam (ZOSYN) IVPB 3.375 g  Status:  Discontinued     3.375 g 100 mL/hr over 30 Minutes Intravenous Every 6 hours 12/20/18 0630 12/20/18 1833   12/19/18 2315  piperacillin-tazobactam (ZOSYN) IVPB 3.375 g     3.375 g 100 mL/hr over 30 Minutes Intravenous  Once 12/19/18 2302 12/20/18 0003       Subjective: Patient reports feeling okay, recovering well     Objective: Vitals:   01/15/19 2042 01/16/19 0535 01/16/19 0554 01/16/19 0739  BP: (!) 91/57 (!) 98/44 (!) 91/53 (!) 97/54  Pulse: 76 74 73 75  Resp: 19 (!) 21 16 (!) 22  Temp: 98 F (36.7 C)  97.8 F (36.6 C) 97.8 F (36.6 C)  TempSrc: Oral  Axillary Axillary  SpO2:  93% 92% 91%  Weight:   (!) 139.5 kg   Height:        Intake/Output Summary (Last 24 hours) at 01/16/2019 1240 Last data filed at 01/16/2019 0500 Gross per 24 hour  Intake 245 ml  Output 750 ml  Net -505 ml   Filed Weights   01/14/19 0630 01/15/19 0357 01/16/19 0554  Weight: (!) 143.2 kg (!) 139.1 kg (!) 139.5 kg    Examination:  General exam: Appears calm and comfortable  Respiratory system: Clear to auscultation. Respiratory effort normal. Cardiovascular system: S1 & S2 heard, RRR. No JVD, murmurs, rubs, gallops or clicks. No pedal edema. Gastrointestinal system: Abdomen is nondistended, soft and nontender. No organomegaly or masses felt. Exam limited d/t habitus and in binder  Central nervous system: Alert and oriented. No focal neurological deficits. Extremities: Symmetric movement  Skin: No rashes, lesions or ulcers Psychiatry: Judgement and insight appear normal. Mood & affect appropriate.     Data Reviewed: I have personally reviewed following labs and imaging studies  CBC: Recent Labs  Lab 01/10/19 0441  01/12/19 0500 01/13/19 0500 01/14/19 0558 01/15/19 0415 01/16/19 0615  WBC 15.7* 12.8* 11.0* 11.0* 10.5 10.0  NEUTROABS 13.0*  --   --   --   --   --   HGB 9.5* 9.0* 9.7* 9.6* 9.6* 9.5*  HCT 30.0* 28.7* 30.3* 31.1* 29.8* 29.9*  MCV 89.8 91.7 91.3 92.0 89.0 89.0  PLT 276 274 305 299 327 563   Basic Metabolic Panel: Recent Labs  Lab 01/10/19 0441 01/10/19 0442 01/11/19 0500 01/13/19 0500 01/14/19 0558  NA  --  134* 136 134* 136  K  --  5.0 4.9 4.7 4.4  CL  --  99 102 101 102  CO2  --  25 26 25 25   GLUCOSE  --  196* 150* 140* 112*  BUN  --  20 19 19 20   CREATININE  --  1.34* 1.20 1.09 1.02  CALCIUM  --  7.6* 8.0* 8.0* 8.1*  MG 2.3  --  2.1 1.8  --   PHOS  --  3.4  --  4.2  --    GFR: Estimated Creatinine Clearance: 113.1 mL/min (by C-G formula based on SCr of 1.02 mg/dL). Liver Function Tests: Recent Labs  Lab 01/10/19 0442 01/11/19 0500 01/13/19 0500  AST  --  22 23  ALT  --  14 18  ALKPHOS  --  59 65  BILITOT  --  0.4 0.5  PROT  --  6.4* 6.3*  ALBUMIN 1.2* 1.3* 1.4*   No results for input(s): LIPASE, AMYLASE in the last 168 hours. No results for input(s): AMMONIA in the last 168 hours. Coagulation Profile: No results for input(s): INR, PROTIME in the last 168 hours. Cardiac Enzymes: No results for input(s): CKTOTAL, CKMB, CKMBINDEX, TROPONINI in the last 168 hours. BNP (last 3 results) No results for input(s): PROBNP in the last 8760 hours. HbA1C: No results for input(s): HGBA1C in the last 72 hours. CBG: Recent Labs  Lab 01/15/19 1157 01/15/19 1752 01/15/19 2059 01/16/19 0741 01/16/19 1130  GLUCAP 150* 119* 133* 104* 192*   Lipid Profile: No results for input(s): CHOL, HDL, LDLCALC, TRIG, CHOLHDL, LDLDIRECT in the last 72 hours. Thyroid Function Tests: No results for input(s): TSH, T4TOTAL, FREET4, T3FREE, THYROIDAB in the last 72 hours. Anemia Panel: No results for input(s): VITAMINB12, FOLATE, FERRITIN, TIBC, IRON, RETICCTPCT in the last 72  hours. Urine analysis:    Component Value Date/Time   COLORURINE YELLOW 12/19/2018 2038   APPEARANCEUR CLEAR 12/19/2018 2038   LABSPEC >1.030 (H) 12/19/2018 2038   LABSPEC 1.025 03/18/2017 1337   PHURINE 5.5 12/19/2018 2038   GLUCOSEU NEGATIVE 12/19/2018 2038   HGBUR MODERATE (A) 12/19/2018 2038   BILIRUBINUR SMALL (A) 12/19/2018 2038   BILIRUBINUR negative 03/18/2017 1337   BILIRUBINUR neg 12/17/2015 0944   KETONESUR NEGATIVE 12/19/2018 2038   PROTEINUR >300 (A) 12/19/2018 2038   UROBILINOGEN negative 12/17/2015 0944   UROBILINOGEN 0.2 12/16/2011 1208   NITRITE NEGATIVE 12/19/2018 2038   LEUKOCYTESUR NEGATIVE 12/19/2018 2038   Sepsis Labs: @LABRCNTIP (procalcitonin:4,lacticidven:4)  No results found for this or any previous visit (from the past 240 hour(s)).       Radiology Studies last 96 hours: No results found.       Scheduled Meds: . acetaminophen  650 mg Oral Q6H  . amiodarone  200 mg Oral Daily  . apixaban  5 mg Oral BID  . chlorhexidine  15 mL Mouth Rinse BID  . Chlorhexidine Gluconate Cloth  6 each Topical Daily  . digoxin  0.125 mg Oral Daily  . feeding supplement (ENSURE ENLIVE)  237 mL Oral TID BM  . feeding supplement (PRO-STAT SUGAR FREE 64)  30 mL Oral TID BM  . insulin aspart  0-20 Units Subcutaneous TID WC  . insulin aspart  0-5 Units Subcutaneous QHS  . mouth rinse  15 mL Mouth Rinse q12n4p  . methocarbamol  500 mg Oral TID  . multivitamin with minerals  1 tablet Oral Daily  . pantoprazole  40 mg Oral Q1200  . sodium chloride flush  10-40 mL Intracatheter Q12H  . sodium chloride flush  5 mL Intracatheter Q8H  . torsemide  20 mg Oral Daily   Continuous Infusions: . sodium chloride 10 mL/hr at 01/09/19 1800  . sodium chloride       LOS: 27 days    Time spent: 25 mins     01/11/19 DO [facility] Pager [phone] or secure message in Epic

## 2019-01-17 ENCOUNTER — Inpatient Hospital Stay (HOSPITAL_COMMUNITY): Payer: BC Managed Care – PPO

## 2019-01-17 LAB — COMPREHENSIVE METABOLIC PANEL
ALT: 17 U/L (ref 0–44)
AST: 18 U/L (ref 15–41)
Albumin: 1.5 g/dL — ABNORMAL LOW (ref 3.5–5.0)
Alkaline Phosphatase: 59 U/L (ref 38–126)
Anion gap: 12 (ref 5–15)
BUN: 17 mg/dL (ref 6–20)
CO2: 26 mmol/L (ref 22–32)
Calcium: 8.2 mg/dL — ABNORMAL LOW (ref 8.9–10.3)
Chloride: 97 mmol/L — ABNORMAL LOW (ref 98–111)
Creatinine, Ser: 1.07 mg/dL (ref 0.61–1.24)
GFR calc Af Amer: >60 mL/min (ref >60)
GFR calc non Af Amer: >60 mL/min (ref >60)
Glucose, Bld: 109 mg/dL — ABNORMAL HIGH (ref 70–99)
Potassium: 3.8 mmol/L (ref 3.5–5.1)
Sodium: 135 mmol/L (ref 135–145)
Total Bilirubin: 0.3 mg/dL (ref 0.3–1.2)
Total Protein: 6.9 g/dL (ref 6.5–8.1)

## 2019-01-17 LAB — DIFFERENTIAL
Abs Immature Granulocytes: 0.31 10*3/uL — ABNORMAL HIGH (ref 0.00–0.07)
Basophils Absolute: 0.1 10*3/uL (ref 0.0–0.1)
Basophils Relative: 1 %
Eosinophils Absolute: 0.3 10*3/uL (ref 0.0–0.5)
Eosinophils Relative: 3 %
Immature Granulocytes: 3 %
Lymphocytes Relative: 16 %
Lymphs Abs: 1.6 10*3/uL (ref 0.7–4.0)
Monocytes Absolute: 1 10*3/uL (ref 0.1–1.0)
Monocytes Relative: 10 %
Neutro Abs: 7 10*3/uL (ref 1.7–7.7)
Neutrophils Relative %: 67 %

## 2019-01-17 LAB — CBC
HCT: 30.5 % — ABNORMAL LOW (ref 39.0–52.0)
Hemoglobin: 9.7 g/dL — ABNORMAL LOW (ref 13.0–17.0)
MCH: 28.2 pg (ref 26.0–34.0)
MCHC: 31.8 g/dL (ref 30.0–36.0)
MCV: 88.7 fL (ref 80.0–100.0)
Platelets: 326 10*3/uL (ref 150–400)
RBC: 3.44 MIL/uL — ABNORMAL LOW (ref 4.22–5.81)
RDW: 14.4 % (ref 11.5–15.5)
WBC: 10.4 10*3/uL (ref 4.0–10.5)
nRBC: 0 % (ref 0.0–0.2)

## 2019-01-17 LAB — GLUCOSE, CAPILLARY
Glucose-Capillary: 110 mg/dL — ABNORMAL HIGH (ref 70–99)
Glucose-Capillary: 142 mg/dL — ABNORMAL HIGH (ref 70–99)
Glucose-Capillary: 144 mg/dL — ABNORMAL HIGH (ref 70–99)
Glucose-Capillary: 138 mg/dL — ABNORMAL HIGH (ref 70–99)

## 2019-01-17 LAB — PREALBUMIN: Prealbumin: 17.8 mg/dL — ABNORMAL LOW (ref 18–38)

## 2019-01-17 LAB — PHOSPHORUS: Phosphorus: 3.7 mg/dL (ref 2.5–4.6)

## 2019-01-17 LAB — MAGNESIUM: Magnesium: 1.7 mg/dL (ref 1.7–2.4)

## 2019-01-17 LAB — TRIGLYCERIDES: Triglycerides: 58 mg/dL (ref <150)

## 2019-01-17 MED ORDER — IOHEXOL 300 MG/ML  SOLN
100.0000 mL | Freq: Once | INTRAMUSCULAR | Status: AC | PRN
  Administered 2019-01-17: 100 mL via INTRAVENOUS

## 2019-01-17 NOTE — Progress Notes (Signed)
Per d/w Dr. Delton See, 1 mo f/u made with CHF clinic. Appt info listed on AVS. Lou Loewe PA-C

## 2019-01-17 NOTE — Progress Notes (Signed)
Referring Physician(s): Darnelle Spangle  Supervising Physician: Simonne Come  Patient Status:  [facility] - In-pt  Chief Complaint:  Abdominal pain/abscess  Subjective: Pt doing ok today; no new c/o ; abd discomfort present but not worsening per pt; denies N/V. LLQ surgical drain has been removed. Getting closer to discharge to SNF.  Allergies: Shrimp [shellfish allergy]  Medications:  Current Facility-Administered Medications:  .  0.9 %  sodium chloride infusion, , Intravenous, PRN, Rometta Emery, MD, Last Rate: 10 mL/hr at 01/09/19 1800, Rate Verify at 01/09/19 1800 .  0.9 %  sodium chloride infusion, 250 mL, Intravenous, Continuous, Dorthea Cove, NP .  acetaminophen (TYLENOL) tablet 650 mg, 650 mg, Oral, Q6H, [name], Kelly A, PA-C, 650 mg at 01/17/19 0908 .  alum & mag hydroxide-simeth (MAALOX/MYLANTA) 200-200-20 MG/5ML suspension 30 mL, 30 mL, Oral, Q4H PRN, Karl Ito, MD, 30 mL at 01/02/19 0218 .  amiodarone (PACERONE) tablet 200 mg, 200 mg, Oral, Daily, Jake Bathe, MD, 200 mg at 01/17/19 0908 .  apixaban (ELIQUIS) tablet 5 mg, 5 mg, Oral, BID, Carney, Gwenlyn Found, RPH, 5 mg at 01/17/19 0909 .  chlorhexidine (PERIDEX) 0.12 % solution 15 mL, 15 mL, Mouth Rinse, BID, Luciano Cutter, MD, 15 mL at 01/17/19 0908 .  Chlorhexidine Gluconate Cloth 2 % PADS 6 each, 6 each, Topical, Daily, Emelia Loron, MD, 6 each at 01/17/19 1100 .  digoxin (LANOXIN) tablet 0.125 mg, 0.125 mg, Oral, Daily, Dang, Thuy D, RPH, 0.125 mg at 01/17/19 0908 .  feeding supplement (ENSURE ENLIVE) (ENSURE ENLIVE) liquid 237 mL, 237 mL, Oral, TID BM, [name], Kelly A, PA-C, 237 mL at 01/17/19 0909 .  feeding supplement (PRO-STAT SUGAR FREE 64) liquid 30 mL, 30 mL, Oral, TID BM, [name], Kelly A, PA-C, 30 mL at 01/15/19 2157 .  insulin aspart (novoLOG) injection 0-20 Units, 0-20 Units, Subcutaneous, TID WC, Gerilyn Nestle, RPH, 3 Units at 01/17/19 1244 .  insulin aspart (novoLOG) injection 0-5  Units, 0-5 Units, Subcutaneous, QHS, Dang, Thuy D, RPH .  MEDLINE mouth rinse, 15 mL, Mouth Rinse, q12n4p, Luciano Cutter, MD, 15 mL at 01/17/19 1245 .  methocarbamol (ROBAXIN) tablet 500 mg, 500 mg, Oral, TID, [name], Kelly A, PA-C, 500 mg at 01/17/19 0908 .  morphine 2 MG/ML injection 2-4 mg, 2-4 mg, Intravenous, Q2H PRN, Manus Rudd, MD, 2 mg at 01/17/19 1357 .  multivitamin with minerals tablet 1 tablet, 1 tablet, Oral, Daily, Gerilyn Nestle, RPH, 1 tablet at 01/17/19 3474 .  ondansetron (ZOFRAN) injection 4-8 mg, 4-8 mg, Intravenous, Q8H PRN, Oretha Milch, MD, 8 mg at 01/06/19 2105 .  oxyCODONE (Oxy IR/ROXICODONE) immediate release tablet 5-10 mg, 5-10 mg, Oral, Q4H PRN, [name], Kelly A, PA-C, 5 mg at 01/13/19 2232 .  pantoprazole (PROTONIX) EC tablet 40 mg, 40 mg, Oral, Q1200, Alwyn Ren, MD, 40 mg at 01/17/19 1245 .  sodium chloride flush (NS) 0.9 % injection 10-40 mL, 10-40 mL, Intracatheter, Q12H, Leslye Peer, MD, 10 mL at 01/17/19 0909 .  sodium chloride flush (NS) 0.9 % injection 10-40 mL, 10-40 mL, Intracatheter, PRN, Leslye Peer, MD, 10 mL at 12/29/18 1834 .  sodium chloride flush (NS) 0.9 % injection 5 mL, 5 mL, Intracatheter, Q8H, Irish Lack, MD, 5 mL at 01/17/19 1246 .  torsemide (DEMADEX) tablet 20 mg, 20 mg, Oral, Daily, Duke Salvia, MD, 20 mg at 01/17/19 1245    Vital Signs: BP 105/62 (BP Location: Left Arm)  Pulse 72   Temp 97.7 F (36.5 C) (Oral)   Resp 18   Ht 5\' 7"  (1.702 m)   Wt (!) 137.5 kg   SpO2 96%   BMI 47.49 kg/m   Physical Exam awake/alert; right abd drain intact, insertion site ok, mildly tender,OP still cloudy with some debris, but not frank pus  Imaging: No results found.  Labs:  CBC: Recent Labs    01/14/19 0558 01/15/19 0415 01/16/19 0615 01/17/19 0616  WBC 11.0* 10.5 10.0 10.4  HGB 9.6* 9.6* 9.5* 9.7*  HCT 31.1* 29.8* 29.9* 30.5*  PLT 299 327 317 326    COAGS: Recent Labs    12/28/18 0447  12/28/18 1410 12/29/18 0500 12/30/18 0447 12/30/18 1639  INR  --  1.5*  --   --   --   APTT 106*  --  29 32 78*    BMP: Recent Labs    01/13/19 0500 01/14/19 0558 01/16/19 1314 01/17/19 0616  NA 134* 136 135 135  K 4.7 4.4 3.9 3.8  CL 101 102 100 97*  CO2 25 25 25 26   GLUCOSE 140* 112* 148* 109*  BUN 19 20 19 17   CALCIUM 8.0* 8.1* 7.9* 8.2*  CREATININE 1.09 1.02 1.19 1.07  GFRNONAA >60 >60 >60 >60  GFRAA >60 >60 >60 >60    LIVER FUNCTION TESTS: Recent Labs    01/08/19 0505 01/10/19 0442 01/11/19 0500 01/13/19 0500 01/17/19 0616  BILITOT 0.2*  --  0.4 0.5 0.3  AST 21  --  22 23 18   ALT 15  --  14 18 17   ALKPHOS 44  --  59 65 59  PROT 5.8*  --  6.4* 6.3* 6.9  ALBUMIN 1.2* 1.2* 1.3* 1.4* 1.5*    Assessment and Plan: Pt with hx postop rt abd abscess, s/p drain placement 12/22; Doing better clinically Last recorded output about 49ml on 1/1, nothing since. Will order follow up CT to assess progress on abscess decompression.  Electronically Signed: Ascencion Dike, PA-C 01/17/2019, 2:22 PM   I spent a total of 15 minutes at the the patient's bedside AND on the patient's hospital floor or unit, greater than 50% of which was counseling/coordinating care for right abdominal abscess drain    Patient ID: Roberto [name], male   DOB: 09/12/1965, 54 y.o.   MRN: [id number]

## 2019-01-17 NOTE — Progress Notes (Signed)
Progress Note  Patient Name: Roberto [name] Date of Encounter: 01/17/2019  Primary Cardiologist: Glori Bickers, MD    Patient Profile     54 y.o. male with a hx of chronic diastolic HF,h/o ofLV thrombus, nonischemic cardiomyopathy (EF 25-30% 2013, EF 2020 25-30%), ICD placed 08/2018, PSVT (elected for medical therapy over RFA), DVT/PE, Paroxysmal afib on amiodarone, Aflutter ablation 11/2016, lupus, and morbid obesityinitially seen in preop consultation early 12/20 for diverticulitis   It did not improve with medical therapy and went to OR 12/15 and found to have non malignant mass and perforated cecum and abscess which required drainage-still present 1/1  OR course cx by hypotension requiring pressors >>ICU w dx of sepsis.  Now off Abx.  BP still low(1/1)  anticoagulation w apixoban   Subjective   The patient states that his SOB is at baseline..  Inpatient Medications    Scheduled Meds: . acetaminophen  650 mg Oral Q6H  . amiodarone  200 mg Oral Daily  . apixaban  5 mg Oral BID  . chlorhexidine  15 mL Mouth Rinse BID  . Chlorhexidine Gluconate Cloth  6 each Topical Daily  . digoxin  0.125 mg Oral Daily  . feeding supplement (ENSURE ENLIVE)  237 mL Oral TID BM  . feeding supplement (PRO-STAT SUGAR FREE 64)  30 mL Oral TID BM  . insulin aspart  0-20 Units Subcutaneous TID WC  . insulin aspart  0-5 Units Subcutaneous QHS  . mouth rinse  15 mL Mouth Rinse q12n4p  . methocarbamol  500 mg Oral TID  . multivitamin with minerals  1 tablet Oral Daily  . pantoprazole  40 mg Oral Q1200  . sodium chloride flush  10-40 mL Intracatheter Q12H  . sodium chloride flush  5 mL Intracatheter Q8H  . torsemide  20 mg Oral Daily   Continuous Infusions: . sodium chloride 10 mL/hr at 01/09/19 1800  . sodium chloride     PRN Meds: sodium chloride, alum & mag hydroxide-simeth, morphine injection, ondansetron (ZOFRAN) IV, oxyCODONE, sodium chloride flush   Vital Signs    Vitals:   01/16/19 1741 01/16/19 2022 01/17/19 0446 01/17/19 0735  BP: 96/66 (!) 102/54 (!) 92/50 106/64  Pulse: 75 79 72 76  Resp: 19 16 16  (!) 21  Temp: 98.6 F (37 C) 98.9 F (37.2 C) 98.1 F (36.7 C) 98 F (36.7 C)  TempSrc: Oral Oral Oral Axillary  SpO2: 94%  95% 94%  Weight:   (!) 137.5 kg   Height:        Intake/Output Summary (Last 24 hours) at 01/17/2019 0831 Last data filed at 01/17/2019 0448 Gross per 24 hour  Intake 910 ml  Output 3685 ml  Net -2775 ml   Filed Weights   01/15/19 0357 01/16/19 0554 01/17/19 0446  Weight: (!) 139.1 kg (!) 139.5 kg (!) 137.5 kg   Telemetry    Sinus with frequent PVCs aout 5% - Personally Reviewed  Physical Exam  BP 106/64 (BP Location: Left Arm)   Pulse 76   Temp 98 F (36.7 C) (Axillary)   Resp (!) 21   Ht 5\' 7"  (1.702 m)   Wt (!) 137.5 kg   SpO2 94%   BMI 47.49 kg/m  Well developed and Morbidly obese in no acute distress HENT normal Neck supple with JVP-  flat   Clear Regular rate and rhythm, no murmurs or gallops Abd-soft with active BS No Clubbing cyanosis B edema 1-2+ edema Skin-warm and dry A &  Oriented  Grossly normal sensory and motor function  Labs    Chemistry Recent Labs  Lab 01/11/19 0500 01/13/19 0500 01/14/19 0558 01/16/19 1314 01/17/19 0616  NA 136 134* 136 135 135  K 4.9 4.7 4.4 3.9 3.8  CL 102 101 102 100 97*  CO2 26 25 25 25 26   GLUCOSE 150* 140* 112* 148* 109*  BUN 19 19 20 19 17   CREATININE 1.20 1.09 1.02 1.19 1.07  CALCIUM 8.0* 8.0* 8.1* 7.9* 8.2*  PROT 6.4* 6.3*  --   --  6.9  ALBUMIN 1.3* 1.4*  --   --  1.5*  AST 22 23  --   --  18  ALT 14 18  --   --  17  ALKPHOS 59 65  --   --  59  BILITOT 0.4 0.5  --   --  0.3  GFRNONAA >60 >60 >60 >60 >60  GFRAA >60 >60 >60 >60 >60  ANIONGAP 8 8 9 10 12      Hematology Recent Labs  Lab 01/15/19 0415 01/16/19 0615 01/17/19 0616  WBC 10.5 10.0 10.4  RBC 3.35* 3.36* 3.44*  HGB 9.6* 9.5* 9.7*  HCT 29.8* 29.9* 30.5*  MCV 89.0 89.0 88.7  MCH  28.7 28.3 28.2  MCHC 32.2 31.8 31.8  RDW 14.7 14.6 14.4  PLT 327 317 326   Cardiac EnzymesNo results for input(s): TROPONINI in the last 168 hours. No results for input(s): TROPIPOC in the last 168 hours.   BNPNo results for input(s): BNP, PROBNP in the last 168 hours.   DDimer No results for input(s): DDIMER in the last 168 hours.   Radiology    No results found.  Cardiac Studies   Echocardiogram 07/20/2018 IMPRESSIONS  1. The left ventricle has severely reduced systolic function, with an ejection fraction of 25-30%. The cavity size was moderately dilated. Left ventricular diastolic Doppler parameters are consistent with pseudonormalization. Left ventricular diffuse  hypokinesis. 2. The right ventricle has normal systolic function. The cavity was normal. There is no increase in right ventricular wall thickness. 3. Left atrial size was mildly dilated. 4. No evidence of mitral valve stenosis. Trivial mitral regurgitation. 5. The aortic valve is tricuspid. No stenosis of the aortic valve. 6. The aortic root is normal in size and structure. 7. The inferior vena cava was dilated in size with <50% respiratory variability. No complete TR doppler jet so unable to estimate PA systolic pressure.    Assessment & Plan    Acute diverticulitis/Colon Mass/ Sepsis Acute on chronic systolic HF (LVEF 25-30%) with volume overload BP low  Morbid obesity with BMI 54. ICD in place  Paroxysmal Afib  Diuretics/ afterload have been on hold in setting of sepsis, restarted on 1/1 with Aldactone and low dose torsemide with excellent diuresis - 2.77 L, weight down 8 kg, aldactone has been held as he became hypotensive.  The patient feels at his baseline, I would continue torsemide 20 mg po daily only.   [facility] will sign off.   Medication Recommendations:  As above  Other recommendations (labs, testing, etc):  No further testing Follow up as an outpatient:  After discharge.      Signed, 03/16/19, MD  01/17/2019, 8:31 AM

## 2019-01-17 NOTE — Progress Notes (Signed)
20 Days Post-Op  Subjective: CC: Doing well. Complains of colostomy bag poking his right leg when he moves around in bed. No abdominal pain. Tolerating diet without any n/v. Having bowel function.   Objective: Vital signs in last 24 hours: Temp:  [98 F (36.7 C)-98.9 F (37.2 C)] 98 F (36.7 C) (01/04 0735) Pulse Rate:  [72-81] 73 (01/04 0908) Resp:  [16-21] 18 (01/04 0900) BP: (92-106)/(50-66) 95/61 (01/04 0900) SpO2:  [94 %-96 %] 96 % (01/04 0900) Weight:  [137.5 kg] 137.5 kg (01/04 0446) Last BM Date: 01/16/19  Intake/Output from previous day: 01/03 0701 - 01/04 0700 In: 1150 [P.O.:1080; I.V.:15] Out: 4035 [Urine:2635; Stool:1400] Intake/Output this shift: No intake/output data recorded.  PE: Gen:  Alert, NAD, pleasant Pulm:  Normal rate and effort  Abd: Soft, NT/ND, +BS, drain in LLQ with minimal SS output. RLQ drain with turbid output, 25cc/24 hours. Midline wound dehisced without evisceration. Healthy granulation tissue at base and on sides. Please see picture below.  Skin: no rashes noted, warm and dry    Lab Results:  Recent Labs    01/16/19 0615 01/17/19 0616  WBC 10.0 10.4  HGB 9.5* 9.7*  HCT 29.9* 30.5*  PLT 317 326   BMET Recent Labs    01/16/19 1314 01/17/19 0616  NA 135 135  K 3.9 3.8  CL 100 97*  CO2 25 26  GLUCOSE 148* 109*  BUN 19 17  CREATININE 1.19 1.07  CALCIUM 7.9* 8.2*   PT/INR No results for input(s): LABPROT, INR in the last 72 hours. CMP     Component Value Date/Time   NA 135 01/17/2019 0616   NA 140 08/26/2018 1355   K 3.8 01/17/2019 0616   CL 97 (L) 01/17/2019 0616   CO2 26 01/17/2019 0616   GLUCOSE 109 (H) 01/17/2019 0616   BUN 17 01/17/2019 0616   BUN 14 08/26/2018 1355   CREATININE 1.07 01/17/2019 0616   CREATININE 1.20 10/29/2016 1236   CALCIUM 8.2 (L) 01/17/2019 0616   PROT 6.9 01/17/2019 0616   PROT 7.4 12/03/2018 1459   ALBUMIN 1.5 (L) 01/17/2019 0616   ALBUMIN 3.6 (L) 12/03/2018 1459   AST 18  01/17/2019 0616   ALT 17 01/17/2019 0616   ALKPHOS 59 01/17/2019 0616   BILITOT 0.3 01/17/2019 0616   BILITOT 0.3 12/03/2018 1459   GFRNONAA >60 01/17/2019 0616   GFRAA >60 01/17/2019 0616   Lipase     Component Value Date/Time   LIPASE 27 12/19/2018 2038       Studies/Results: No results found.  Anti-infectives: Anti-infectives (From admission, onward)   Start     Dose/Rate Route Frequency Ordered Stop   12/28/18 1400  fluconazole (DIFLUCAN) IVPB 400 mg     400 mg 100 mL/hr over 120 Minutes Intravenous Every 24 hours 12/28/18 1318 01/09/19 2359   12/20/18 2200  piperacillin-tazobactam (ZOSYN) IVPB 3.375 g     3.375 g 12.5 mL/hr over 240 Minutes Intravenous Every 8 hours 12/20/18 1834 01/09/19 2359   12/20/18 0645  piperacillin-tazobactam (ZOSYN) IVPB 3.375 g  Status:  Discontinued     3.375 g 100 mL/hr over 30 Minutes Intravenous Every 6 hours 12/20/18 0630 12/20/18 1833   12/19/18 2315  piperacillin-tazobactam (ZOSYN) IVPB 3.375 g     3.375 g 100 mL/hr over 30 Minutes Intravenous  Once 12/19/18 2302 12/20/18 0003       Assessment/Plan Left colonic stricture of unknown etiology; with perforated cecum POD# 20STC/ileostomy fordiverticular stricture of left  colon and perforated R colon12/15/2020,Dr. Emelia Loron - Pathwithout evidence of malignancy -S/p drain placement 12/22 - cxsE coli pansensitive,abx stopped 12/27. IR plans repeat CT once output minimal. They are following.  -Ileostomy functioning, tolerating a diet.Prealbumin improving. TPN off - wound dehisced, monitor closely. BID wet to dry dressing changes with adaptic in the base (will reorder adaptic) - abdominal binder - oob, should be ready for SNF placement when bed available - Surgical drain removed LLQ  FEN:Cm diet and supplements IFO:YDXAJOIN>> 12/28 ID: IV Zosyn 12/6>>12/27, IV diflucan 12/15>>12/27. Afebrile. WBC 10.4 Follow up: Dr. Dwain Sarna   LOS: 28 days    Roberto [name] , [facility] 01/17/2019, 10:48 AM Please see Amion for pager number during day hours 7:00am-4:30pm

## 2019-01-17 NOTE — Discharge Instructions (Addendum)
Information on my medicine - ELIQUIS (apixaban)  Why was Eliquis prescribed for you? Eliquis was prescribed to treat blood clots that may have been found in the veins of your legs (deep vein thrombosis) or in your lungs (pulmonary embolism) and to reduce the risk of them occurring again.  What do You need to know about Eliquis ? Take ONE 5 mg tablet taken TWICE daily.  Eliquis may be taken with or without food.   Try to take the dose about the same time in the morning and in the evening. If you have difficulty swallowing the tablet whole please discuss with your pharmacist how to take the medication safely.  Take Eliquis exactly as prescribed and DO NOT stop taking Eliquis without talking to the doctor who prescribed the medication.  Stopping may increase your risk of developing a new blood clot.  Refill your prescription before you run out.  After discharge, you should have regular check-up appointments with your healthcare provider that is prescribing your Eliquis.    What do you do if you miss a dose? If a dose of ELIQUIS is not taken at the scheduled time, take it as soon as possible on the same day and twice-daily administration should be resumed. The dose should not be doubled to make up for a missed dose.  Important Safety Information A possible side effect of Eliquis is bleeding. You should call your healthcare provider right away if you experience any of the following: ? Bleeding from an injury or your nose that does not stop. ? Unusual colored urine (red or dark brown) or unusual colored stools (red or black). ? Unusual bruising for unknown reasons. ? A serious fall or if you hit your head (even if there is no bleeding).  Some medicines may interact with Eliquis and might increase your risk of bleeding or clotting while on Eliquis. To help avoid this, consult your healthcare provider or pharmacist prior to using any new prescription or non-prescription medications,  including herbals, vitamins, non-steroidal anti-inflammatory drugs (NSAIDs) and supplements.  This website has more information on Eliquis (apixaban): http://[email or web address].com/eliquis/home  [facility]      [facility], [place] [phone]  OPEN ABDOMINAL SURGERY: POST OP INSTRUCTIONS  Always review your discharge instruction sheet given to you by the facility where your surgery was performed.  IF YOU HAVE DISABILITY OR FAMILY LEAVE FORMS, YOU MUST BRING THEM TO THE OFFICE FOR PROCESSING.  PLEASE DO NOT GIVE THEM TO YOUR DOCTOR.  1. A prescription for pain medication may be given to you upon discharge.  Take your pain medication as prescribed, if needed.  If narcotic pain medicine is not needed, then you may take acetaminophen (Tylenol) or ibuprofen (Advil) as needed. 2. Take your usually prescribed medications unless otherwise directed. 3. If you need a refill on your pain medication, please contact your pharmacy. They will contact our office to request authorization.  Prescriptions will not be filled after 5pm or on week-ends. 4. You should follow a light diet the first few days after arrival home, such as soup and crackers, pudding, etc.unless your doctor has advised otherwise. A high-fiber, low fat diet can be resumed as tolerated.   Be sure to include lots of fluids daily. Most patients will experience some swelling and bruising on the chest and neck area.  Ice packs will help.  Swelling and bruising can take several days to resolve 5. Most patients will experience some swelling and bruising in the area of the incision. Ice pack  will help. Swelling and bruising can take several days to resolve..  6. It is common to experience some constipation if taking pain medication after surgery.  Increasing fluid intake and taking a stool softener will usually help or prevent this problem from occurring.  A mild laxative (Milk of Magnesia or Miralax) should be taken according to package directions if  there are no bowel movements after 48 hours. 7.  You may have steri-strips (small skin tapes) in place directly over the incision.  These strips should be left on the skin for 7-10 days.  If your surgeon used skin glue on the incision, you may shower in 24 hours.  The glue will flake off over the next 2-3 weeks.  Any sutures or staples will be removed at the office during your follow-up visit. You may find that a light gauze bandage over your incision may keep your staples from being rubbed or pulled. You may shower and replace the bandage daily. 8. ACTIVITIES:  You may resume regular (light) daily activities beginning the next day--such as daily self-care, walking, climbing stairs--gradually increasing activities as tolerated.  You may have sexual intercourse when it is comfortable.  Refrain from any heavy lifting or straining until approved by your doctor. a. You may drive when you no longer are taking prescription pain medication, you can comfortably wear a seatbelt, and you can safely maneuver your car and apply brakes b. Return to Work: ___________________________________ 56. You should see your doctor in the office for a follow-up appointment approximately two weeks after your surgery.  Make sure that you call for this appointment within a day or two after you arrive home to insure a convenient appointment time. OTHER INSTRUCTIONS:  _____________________________________________________________ _____________________________________________________________  WHEN TO CALL YOUR DOCTOR: 1. Fever over 101.0 2. Inability to urinate 3. Nausea and/or vomiting 4. Extreme swelling or bruising 5. Continued bleeding from incision. 6. Increased pain, redness, or drainage from the incision. 7. Difficulty swallowing or breathing 8. Muscle cramping or spasms. 9. Numbness or tingling in hands or feet or around lips.  The clinic staff is available to answer your questions during regular business hours.  Please  don't hesitate to call and ask to speak to one of the nurses if you have concerns.  For further questions, please visit [email or web address]  Wet to Dry WOUND CARE: - Change dressing twice daily - Supplies: sterile saline, kerlex, adaptic, scissors, ABD pads, tape  1. Remove dressing and all packing carefully, moistening with sterile saline as needed to avoid packing/internal dressing sticking to the wound. 2.   Clean edges of skin around the wound with water/gauze, making sure there is no tape debris or leakage left on skin that could cause skin irritation or breakdown. 3.   Apply adaptic to base of wound. Dampen and clean kerlex with sterile saline and pack wound from wound base to skin level, making sure to take note of any possible areas of wound tracking, tunneling and packing appropriately. Wound can be packed loosely. Trim kerlex to size if a whole kerlex is not required. 4.   Cover wound with a dry ABD pad and secure with tape.  5.   Write the date/time on the dry dressing/tape to better track when the last dressing change occurred. - apply any skin protectant/powder if recommended by clinician to protect skin/skin folds. - change dressing as needed if leakage occurs, wound gets contaminated, or patient requests to shower. - You may shower daily with wound open and following the  shower the wound should be dried and a clean dressing placed.  - Medical grade tape as well as packing supplies can be found at [facility] on [address]. The remaining supplies can be found at your local drug store, [employer] etc.

## 2019-01-17 NOTE — Progress Notes (Signed)
Physical Therapy Treatment Patient Details Name: Roberto [name] MRN: [id number] DOB: [birth year] Today's Date: 01/17/2019    History of Present Illness Pt is a 54 y.o. M with history of NICM, EF 25-30%, PSVT, PAF, chronic systolic HF with AICD, DVT/PE/LV thrombus , on chronic anticoagulation who was admitted 12/20/2018 for acute diverticulitis. He was initially treated conservatively, but had no real improvement and was taken to the OR 12/28/2018.Pt was found to have a perforated cecum with leakage of stool throughout abdominal cavity. 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 He was brought from the OR to NICU intubated and on pressors. Extubated 12/16.    PT Comments    Patient received in bed, premedicated by nursing and motivated to participate in PT today. Shows significant improvement in functional mobility, able to complete bed mobility with S and extended time/use of rail, and functional transfers with ModAx2 and RW. Tolerated slight progression in gait training with RW and min guard x2, does report some foot pain since being up on his feet more often- will plan to try shoes next session if he has them available. VSS on room air. He was left up in the chair with all needs met, RN aware regarding mobility for back to bed.     Follow Up Recommendations  SNF     Equipment Recommendations  Rolling walker with 5" wheels;3in1 (PT)    Recommendations for Other Services       Precautions / Restrictions Precautions Precautions: Fall;Other (comment) Precaution Comments: colostomy, JP drain R , needs abdominal binder Restrictions Weight Bearing Restrictions: No    Mobility  Bed Mobility Overal bed mobility: Needs Assistance Bed Mobility: Rolling;Sidelying to Sit Rolling: Supervision Sidelying to sit: Supervision       General bed mobility comments: mobility much improved, able to complete all bed level tasks with S and cues for rolling  Transfers Overall transfer level:  Needs assistance Equipment used: Rolling walker (2 wheeled) Transfers: Sit to/from Stand Sit to Stand: Mod assist;+2 physical assistance         General transfer comment: ModAx2 to boost to full standing position with RW, cues for hand placement  Ambulation/Gait Ambulation/Gait assistance: Min guard Gait Distance (Feet): 3 Feet Assistive device: Rolling walker (2 wheeled) Gait Pattern/deviations: Step-to pattern;Decreased step length - right;Decreased step length - left;Trunk flexed Gait velocity: decreased   General Gait Details: gait distance limited by foot pain, able to slightly progress gait distance with VC for navigation and safety   Stairs             Wheelchair Mobility    Modified Rankin (Stroke Patients Only)       Balance Overall balance assessment: Needs assistance Sitting-balance support: Bilateral upper extremity supported;Feet supported Sitting balance-Leahy Scale: Good Sitting balance - Comments: S-min guard for balance   Standing balance support: Bilateral upper extremity supported Standing balance-Leahy Scale: Fair Standing balance comment: bil UE reliance on RW, external support                            Cognition Arousal/Alertness: Awake/alert Behavior During Therapy: WFL for tasks assessed/performed;Anxious Overall Cognitive Status: Within Functional Limits for tasks assessed                                 General Comments: feeling better today, remains motivated to participate      Exercises  General Comments General comments (skin integrity, edema, etc.): VSS      Pertinent Vitals/Pain Pain Assessment: No/denies pain Pain Score: 0-No pain Pain Intervention(s): Limited activity within patient's tolerance;Monitored during session    Home Living                      Prior Function            PT Goals (current goals can now be found in the care plan section) Acute Rehab PT  Goals Patient Stated Goal: to get OOB PT Goal Formulation: With patient Time For Goal Achievement: 01/31/19 Potential to Achieve Goals: Good Progress towards PT goals: Progressing toward goals    Frequency    Min 3X/week      PT Plan Current plan remains appropriate    Co-evaluation              AM-PAC PT "6 Clicks" Mobility   Outcome Measure  Help needed turning from your back to your side while in a flat bed without using bedrails?: A Little Help needed moving from lying on your back to sitting on the side of a flat bed without using bedrails?: A Little Help needed moving to and from a bed to a chair (including a wheelchair)?: A Lot Help needed standing up from a chair using your arms (e.g., wheelchair or bedside chair)?: A Lot Help needed to walk in hospital room?: A Lot Help needed climbing 3-5 steps with a railing? : Total 6 Click Score: 13    End of Session Equipment Utilized During Treatment: Other (comment)(abdominal binder) Activity Tolerance: Patient tolerated treatment well Patient left: in chair;with call bell/phone within reach Nurse Communication: Mobility status PT Visit Diagnosis: Other abnormalities of gait and mobility (R26.89);Pain;Muscle weakness (generalized) (M62.81) Pain - Right/Left: Right Pain - part of body: (abdomen)     Time: 1610-9604 PT Time Calculation (min) (ACUTE ONLY): 13 min  Charges:  $Therapeutic Activity: 8-22 mins                     Windell Norfolk, DPT, PN1   Supplemental Physical Therapist [place]    Pager [phone] Acute Rehab Office [phone]

## 2019-01-17 NOTE — Consult Note (Signed)
WOC Nurse ostomy follow up Patient receiving care in [facility] 6E26.  No ostomy supplies in room, despite order for supplies being in record. Order re-entered and hopefully more easily seen. All ostomy care except emptying and discussion of ostomy care process deferred due to lack of ostomy supplies.  Plan to return tomorrow for change process. Helmut Muster, RN, MSN, CWOCN, CNS-BC, pager [phone]

## 2019-01-17 NOTE — Progress Notes (Addendum)
PROGRESS NOTE    CODEN FRANCHI  XNA:[id number] DOB: May 14, 1965 DOA: 12/19/2018 PCP: Denita Lung, MD Outpatient Specialists:    Brief Narrative:   Pt. With history of NICM, EF 25-30%, PSVT, PAF, chronic systolic HF with AICD, hx of DVT/PE/LV thrombus , on chronic anticoagulation, was admitted 12/20/2018 for acute diverticulitis. He was initially treated conservatively, but had no real improvement and was taken to the OR 12/28/2018. Pt had Left colonic stricture of unknown etiology, and perforated cecum. Per surgery's note there was a mass in the mid descending colon (pathology negative for malignancy). The right colon was ischemic and had perforated. There was visualization of leakage of a large amount of stool throughout his entire abdominal cavity. In OR patient became hypotensive, required pressors (Phenylephrine and Levo). Surgery felt the patient was not ready for extubation, and considering co morbidities and possible septic insult asked PCCM to admit to ICU manage care. Transferred to [facility] service on 01/03/2019 now off pressors.    Today 01/17/19: stable and doing well, we are waiting on SNF bed offer and still planning to repeat CT Abd per surgery, they report today that brain bulbs are still pretty full.     Assessment & Plan:   Principal Problem:   Acute diverticulitis Active Problems:   Obesity, morbid (HCC)   Nonischemic cardiomyopathy (HCC)   Paroxysmal SVT (supraventricular tachycardia) (HCC)   Chronic systolic heart failure (HCC)   Chronic pain of right knee   History of DVT (deep vein thrombosis)   PAF (paroxysmal atrial fibrillation) (HCC)   Pre-operative cardiovascular examination   Bowel obstruction (HCC)   Respiratory failure (HCC)   Acute on chronic systolic heart failure (HCC)   Septic shock secondary to perforated cecum and contamination of abdominal cavity with stool s/p subtotal colectomy and ileostomy- resolved/recovered well   Peritoneal  abscess status post CT-guided drainage by interventional radiologist on 01/04/2019, Dr. Kathlene Cote. Management per interventional radiology; appreciate assistance  Plan to repeat CT of the abdomen once output is less than 10 cc.  dehisced abdominal wound. Wet-to-dry dressing changes twice a day per surgery.  TPNstopped12/30/2020.   Patient tolerating p.o. intake.  afebrile,s/p abscess drain, patient tolerating abdominal binder.   Patient was treated with Zosyn and Diflucan which has been stopped as of 01/09/2019  Acute hypoxic respiratory failure possibly secondary to postop atelectasis  Normal oxygen supplementation at baseline   on RA now; stable  Chronic combined systolic and diastolic CHF  negative by 2.7 L.  Last 2D echo LVEF 25 to 30%  Status post AICD  watch daily wts  willstartentresto when stabilizes  Appreciate recs from cardiology re po meds. Cardio has signed off today 01/17/19  Paroxysmal A. fib History of DVT/PE/LV thrombus  continue digoxin, amiodarone  Cardiology following   Eliquis restarted 01/10/2019.  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 Patient not a candidate for CIR at this time.  Consultants:   Surgery   Cardiology - s/o 01/16/2018, pt has f/u appt   radiology  Procedures:   01/04/19: CT Guided Drainage ofPeritoneal Abscess  12/28/18: Exploratory laparotomy, Subtotal colectomy, End ileostomy for Left colonic stricture of unknown etiology, perforated cecum  12/23/18: Flexible Sigmoidoscopy      Antimicrobials:  Anti-infectives (From admission, onward)   Start  Dose/Rate Route Frequency Ordered Stop   12/28/18 1400  fluconazole (DIFLUCAN) IVPB 400 mg     400 mg 100 mL/hr over 120 Minutes  Intravenous Every 24 hours 12/28/18 1318 01/09/19 2359   12/20/18 2200  piperacillin-tazobactam (ZOSYN) IVPB 3.375 g     3.375 g 12.5 mL/hr over 240 Minutes Intravenous Every 8 hours 12/20/18 1834 01/09/19 2359   12/20/18 0645  piperacillin-tazobactam (ZOSYN) IVPB 3.375 g  Status:  Discontinued     3.375 g 100 mL/hr over 30 Minutes Intravenous Every 6 hours 12/20/18 0630 12/20/18 1833   12/19/18 2315  piperacillin-tazobactam (ZOSYN) IVPB 3.375 g     3.375 g 100 mL/hr over 30 Minutes Intravenous  Once 12/19/18 2302 12/20/18 0003       Subjective: Patient reports feeling okay, recovering well, same as yesterday 01/15/18    Objective: Vitals:   01/17/19 0735 01/17/19 0900 01/17/19 0908 01/17/19 1206  BP: 106/64 95/61  105/62  Pulse: 76 76 73 72  Resp: (!) 21 18  18   Temp: 98 F (36.7 C)   97.7 F (36.5 C)  TempSrc: Axillary   Oral  SpO2: 94% 96%  96%  Weight:      Height:        Intake/Output Summary (Last 24 hours) at 01/17/2019 1213 Last data filed at 01/17/2019 0448 Gross per 24 hour  Intake 910 ml  Output 3685 ml  Net -2775 ml   Filed Weights   01/15/19 0357 01/16/19 0554 01/17/19 0446  Weight: (!) 139.1 kg (!) 139.5 kg (!) 137.5 kg    Examination:  General exam: Appears calm and comfortable  Respiratory system: Clear to auscultation. Respiratory effort normal. Cardiovascular system: S1 & S2 heard, RRR. No JVD, murmurs, rubs, gallops or clicks. No pedal edema. Gastrointestinal system: Abdomen is nondistended, soft and nontender. No organomegaly or masses felt. Exam limited d/t habitus and in binder  Central nervous system: Alert and oriented. No focal neurological deficits. Extremities: Symmetric movement  Skin: No rashes, lesions or ulcers Psychiatry: Judgement and insight appear normal. Mood & affect appropriate.     Data Reviewed: I have personally reviewed following labs and imaging studies  CBC: Recent Labs  Lab 01/13/19 0500 01/14/19 0558  01/15/19 0415 01/16/19 0615 01/17/19 0616  WBC 11.0* 11.0* 10.5 10.0 10.4  NEUTROABS  --   --   --   --  7.0  HGB 9.7* 9.6* 9.6* 9.5* 9.7*  HCT 30.3* 31.1* 29.8* 29.9* 30.5*  MCV 91.3 92.0 89.0 89.0 88.7  PLT 305 299 327 317 326   Basic Metabolic Panel: Recent Labs  Lab 01/11/19 0500 01/13/19 0500 01/14/19 0558 01/16/19 1314 01/17/19 0616  NA 136 134* 136 135 135  K 4.9 4.7 4.4 3.9 3.8  CL 102 101 102 100 97*  CO2 26 25 25 25 26   GLUCOSE 150* 140* 112* 148* 109*  BUN 19 19 20 19 17   CREATININE 1.20 1.09 1.02 1.19 1.07  CALCIUM 8.0* 8.0* 8.1* 7.9* 8.2*  MG 2.1 1.8  --   --  1.7  PHOS  --  4.2  --   --  3.7   GFR: Estimated Creatinine Clearance: 106.9 mL/min (by C-G formula based on SCr of 1.07 mg/dL). Liver Function Tests: Recent Labs  Lab 01/11/19 0500 01/13/19 0500 01/17/19 0616  AST 22 23 18   ALT 14 18 17   ALKPHOS 59 65 59  BILITOT 0.4 0.5 0.3  PROT 6.4* 6.3* 6.9  ALBUMIN 1.3* 1.4* 1.5*   No  results for input(s): LIPASE, AMYLASE in the last 168 hours. No results for input(s): AMMONIA in the last 168 hours. Coagulation Profile: No results for input(s): INR, PROTIME in the last 168 hours. Cardiac Enzymes: No results for input(s): CKTOTAL, CKMB, CKMBINDEX, TROPONINI in the last 168 hours. BNP (last 3 results) No results for input(s): PROBNP in the last 8760 hours. HbA1C: No results for input(s): HGBA1C in the last 72 hours. CBG: Recent Labs  Lab 01/16/19 1130 01/16/19 1656 01/16/19 2052 01/17/19 0732 01/17/19 1204  GLUCAP 192* 86 151* 110* 142*   Lipid Profile: Recent Labs    01/17/19 0616  TRIG 58   Thyroid Function Tests: No results for input(s): TSH, T4TOTAL, FREET4, T3FREE, THYROIDAB in the last 72 hours. Anemia Panel: No results for input(s): VITAMINB12, FOLATE, FERRITIN, TIBC, IRON, RETICCTPCT in the last 72 hours. Urine analysis:    Component Value Date/Time   COLORURINE YELLOW 12/19/2018 2038   APPEARANCEUR CLEAR 12/19/2018 2038    LABSPEC >1.030 (H) 12/19/2018 2038   LABSPEC 1.025 03/18/2017 1337   PHURINE 5.5 12/19/2018 2038   GLUCOSEU NEGATIVE 12/19/2018 2038   HGBUR MODERATE (A) 12/19/2018 2038   BILIRUBINUR SMALL (A) 12/19/2018 2038   BILIRUBINUR negative 03/18/2017 1337   BILIRUBINUR neg 12/17/2015 0944   KETONESUR NEGATIVE 12/19/2018 2038   PROTEINUR >300 (A) 12/19/2018 2038   UROBILINOGEN negative 12/17/2015 0944   UROBILINOGEN 0.2 12/16/2011 1208   NITRITE NEGATIVE 12/19/2018 2038   LEUKOCYTESUR NEGATIVE 12/19/2018 2038   Sepsis Labs: @LABRCNTIP (procalcitonin:4,lacticidven:4)  No results found for this or any previous visit (from the past 240 hour(s)).       Radiology Studies last 96 hours: No results found.       Scheduled Meds: . acetaminophen  650 mg Oral Q6H  . amiodarone  200 mg Oral Daily  . apixaban  5 mg Oral BID  . chlorhexidine  15 mL Mouth Rinse BID  . Chlorhexidine Gluconate Cloth  6 each Topical Daily  . digoxin  0.125 mg Oral Daily  . feeding supplement (ENSURE ENLIVE)  237 mL Oral TID BM  . feeding supplement (PRO-STAT SUGAR FREE 64)  30 mL Oral TID BM  . insulin aspart  0-20 Units Subcutaneous TID WC  . insulin aspart  0-5 Units Subcutaneous QHS  . mouth rinse  15 mL Mouth Rinse q12n4p  . methocarbamol  500 mg Oral TID  . multivitamin with minerals  1 tablet Oral Daily  . pantoprazole  40 mg Oral Q1200  . sodium chloride flush  10-40 mL Intracatheter Q12H  . sodium chloride flush  5 mL Intracatheter Q8H  . torsemide  20 mg Oral Daily   Continuous Infusions: . sodium chloride 10 mL/hr at 01/09/19 1800  . sodium chloride       LOS: 28 days    Time spent: 25 mins     01/11/19 DO [facility] Pager [phone] or secure message in Epic

## 2019-01-18 LAB — CBC
HCT: 32.1 % — ABNORMAL LOW (ref 39.0–52.0)
Hemoglobin: 10 g/dL — ABNORMAL LOW (ref 13.0–17.0)
MCH: 28 pg (ref 26.0–34.0)
MCHC: 31.2 g/dL (ref 30.0–36.0)
MCV: 89.9 fL (ref 80.0–100.0)
Platelets: 338 10*3/uL (ref 150–400)
RBC: 3.57 MIL/uL — ABNORMAL LOW (ref 4.22–5.81)
RDW: 14.5 % (ref 11.5–15.5)
WBC: 10.5 10*3/uL (ref 4.0–10.5)
nRBC: 0 % (ref 0.0–0.2)

## 2019-01-18 LAB — BASIC METABOLIC PANEL
Anion gap: 12 (ref 5–15)
BUN: 17 mg/dL (ref 6–20)
CO2: 26 mmol/L (ref 22–32)
Calcium: 8.3 mg/dL — ABNORMAL LOW (ref 8.9–10.3)
Chloride: 100 mmol/L (ref 98–111)
Creatinine, Ser: 1.15 mg/dL (ref 0.61–1.24)
GFR calc Af Amer: >60 mL/min (ref >60)
GFR calc non Af Amer: >60 mL/min (ref >60)
Glucose, Bld: 113 mg/dL — ABNORMAL HIGH (ref 70–99)
Potassium: 4 mmol/L (ref 3.5–5.1)
Sodium: 138 mmol/L (ref 135–145)

## 2019-01-18 LAB — GLUCOSE, CAPILLARY
Glucose-Capillary: 123 mg/dL — ABNORMAL HIGH (ref 70–99)
Glucose-Capillary: 178 mg/dL — ABNORMAL HIGH (ref 70–99)
Glucose-Capillary: 120 mg/dL — ABNORMAL HIGH (ref 70–99)
Glucose-Capillary: 146 mg/dL — ABNORMAL HIGH (ref 70–99)

## 2019-01-18 LAB — DIGOXIN LEVEL: Digoxin Level: 0.4 ng/mL — ABNORMAL LOW (ref 0.8–2.0)

## 2019-01-18 NOTE — Progress Notes (Signed)
21 Days Post-Op  Subjective: CC: Doing well, tolerating a diet without n/v. Having bowel function. Worked with PT yesterday. No abdominal pain.   Objective: Vital signs in last 24 hours: Temp:  [97.6 F (36.4 C)-98.8 F (37.1 C)] 97.7 F (36.5 C) (01/05 0446) Pulse Rate:  [72-81] 78 (01/05 0446) Resp:  [18-25] 19 (01/04 1900) BP: (95-112)/(61-73) 102/63 (01/05 0446) SpO2:  [93 %-98 %] 98 % (01/05 0446) Weight:  [135.6 kg] 135.6 kg (01/05 0446) Last BM Date: 01/17/19(ileostomy)  Intake/Output from previous day: 01/04 0701 - 01/05 0700 In: 804 [P.O.:804] Out: 2925 [Urine:2475; Stool:450] Intake/Output this shift: No intake/output data recorded.  PE: Gen:  Alert, NAD, pleasant Pulm:  Normal rate and effort  Abd: Soft, NT/ND, +BS, drain site LLQ c/d/i. RLQ drain with turbid output. Midline wound dehisced without evisceration. Healthy granulation tissue at base and on sides. Adaptic at base. Stable from yesterday.  Skin: no rashes noted, warm and dry  Lab Results:  Recent Labs    01/17/19 0616 01/18/19 0740  WBC 10.4 10.5  HGB 9.7* 10.0*  HCT 30.5* 32.1*  PLT 326 338   BMET Recent Labs    01/16/19 1314 01/17/19 0616  NA 135 135  K 3.9 3.8  CL 100 97*  CO2 25 26  GLUCOSE 148* 109*  BUN 19 17  CREATININE 1.19 1.07  CALCIUM 7.9* 8.2*   PT/INR No results for input(s): LABPROT, INR in the last 72 hours. CMP     Component Value Date/Time   NA 135 01/17/2019 0616   NA 140 08/26/2018 1355   K 3.8 01/17/2019 0616   CL 97 (L) 01/17/2019 0616   CO2 26 01/17/2019 0616   GLUCOSE 109 (H) 01/17/2019 0616   BUN 17 01/17/2019 0616   BUN 14 08/26/2018 1355   CREATININE 1.07 01/17/2019 0616   CREATININE 1.20 10/29/2016 1236   CALCIUM 8.2 (L) 01/17/2019 0616   PROT 6.9 01/17/2019 0616   PROT 7.4 12/03/2018 1459   ALBUMIN 1.5 (L) 01/17/2019 0616   ALBUMIN 3.6 (L) 12/03/2018 1459   AST 18 01/17/2019 0616   ALT 17 01/17/2019 0616   ALKPHOS 59 01/17/2019 0616   BILITOT 0.3 01/17/2019 0616   BILITOT 0.3 12/03/2018 1459   GFRNONAA >60 01/17/2019 0616   GFRAA >60 01/17/2019 0616   Lipase     Component Value Date/Time   LIPASE 27 12/19/2018 2038       Studies/Results: CT ABDOMEN PELVIS W CONTRAST  Result Date: 01/17/2019 CLINICAL DATA:  Right lateral abscess strain EXAM: CT ABDOMEN AND PELVIS WITH CONTRAST TECHNIQUE: Multidetector CT imaging of the abdomen and pelvis was performed using the standard protocol following bolus administration of intravenous contrast. CONTRAST:  OMNIPAQUE IOHEXOL 300 MG/ML  SOLN COMPARISON:  CT abdomen pelvis 01/03/2019 FINDINGS: Lower chest: Resolved pleural effusions with some minimal residual atelectatic changes in the lung bases. Cardiac pacer/defibrillator leads terminating at the right atrium and cardiac apex. Borderline cardiomegaly is similar to prior. Hepatobiliary: Stable appearance of a 3.8 cm hypoattenuating lesion along the posterior right lobe liver additional subcentimeter hypoattenuating foci within the liver are also remains stable but are incompletely characterized on this exam. Redemonstration of a complex air and fluid containing collection on the right anterolateral aspect of the liver which extends superiorly to the gallbladder fossa with associated pericholecystic inflammation. Overall size of this collection is diminished from comparison exam measuring approximately 9.9 x 1.7 x 11 cm in maximal dimensions. Gallbladder is normally distended with  few layering hyperdense gallstones similar to comparison study. Pancreas: Unremarkable. No pancreatic ductal dilatation or surrounding inflammatory changes. Spleen: Normal in size without focal abnormality. Adrenals/Urinary Tract: The adrenal glands are unremarkable. Few areas of cortical scarring are noted in the right kidney. No worrisome renal lesions or urolithiasis. The urinary bladder is mildly thickened with some gradient attenuation to the bladder  contents as well as anti dependently layering hypodense material, nonspecific in appearance. Stomach/Bowel: Distal esophagus, stomach and duodenal sweep are unremarkable. Decreasing distention of the distal small bowel. Several of the small bowel loops appear closely apposed to the anterior abdominal wall at the site of the abdominal incision left to secondary intention. Postsurgical changes from subtotal colectomy with residual Hartmann's pouch. A right lower quadrant end ileostomy is noted. Vascular/Lymphatic: The aorta is normal caliber. Reactive adenopathy in the mid mesentery. Reproductive: The prostate and seminal vesicles are unremarkable. Other: Decreasing size of the right upper quadrant air and fluid containing abscess along the lateral margin of the liver extending in the right paracolic gutter. A pigtail catheter terminates appropriately within this collection. There are scattered areas of diffuse mesenteric edema as well as a more focal regions of phlegmon in the right lower quadrant (3/57 and 3/49) which could reflect areas of mesenteric edema or developing fat necrosis related to recent operation. Anterior abdominal incision overlying bandaging material likely reflecting a VAC dressing. A right lower quadrant end ileostomy is noted. A right upper quadrant pigtail drain is present as well. Diminishing lateral subcutaneous abdominal wall stranding and edema. Musculoskeletal: No acute osseous abnormality or suspicious osseous lesion. Partial fusion of the right SI joint, similar to prior. IMPRESSION: 1. Decreasing size of the right upper quadrant air and fluid containing abscess along the lateral margin of the liver which extends to the gallbladder fossa superiorly and into the right pericolic gutter inferiorly. A pigtail catheter drain is positioned within this collection. 2. Cholelithiasis with mild pericholecystic inflammation, favor secondary to reactive inflammation. 3. Irregular thickening of the  urinary bladder with gradient density of the bladder contents and anti dependently layering hypodense material. Nonspecific though worrisome for a cystitis, could correlate with urinalysis. 4. Decreasing distention of the distal small bowel. 5. Scattered areas of diffuse mesenteric edema as well as a more focal regions of phlegmon in the right lower quadrant which could reflect areas of mesenteric edema or developing fat necrosis related to recent operation. 6. Resolved pleural effusions with some minimal residual atelectatic changes in the lung bases. 7. Diminishing body wall edema. Electronically Signed   By: Lovena Le M.D.   On: 01/17/2019 23:54    Anti-infectives: Anti-infectives (From admission, onward)   Start     Dose/Rate Route Frequency Ordered Stop   12/28/18 1400  fluconazole (DIFLUCAN) IVPB 400 mg     400 mg 100 mL/hr over 120 Minutes Intravenous Every 24 hours 12/28/18 1318 01/09/19 2359   12/20/18 2200  piperacillin-tazobactam (ZOSYN) IVPB 3.375 g     3.375 g 12.5 mL/hr over 240 Minutes Intravenous Every 8 hours 12/20/18 1834 01/09/19 2359   12/20/18 0645  piperacillin-tazobactam (ZOSYN) IVPB 3.375 g  Status:  Discontinued     3.375 g 100 mL/hr over 30 Minutes Intravenous Every 6 hours 12/20/18 0630 12/20/18 1833   12/19/18 2315  piperacillin-tazobactam (ZOSYN) IVPB 3.375 g     3.375 g 100 mL/hr over 30 Minutes Intravenous  Once 12/19/18 2302 12/20/18 0003       Assessment/Plan Left colonic stricture of unknown etiology; with perforated  cecum POD# 21STC/ileostomy fordiverticular stricture of left colon and perforated R colon12/15/2020,Dr. Emelia Loron - Pathwithout evidence of malignancy -S/p drain placement 12/22 - cxsE coli pansensitive,abx stopped 12/27. Repeat CT 1/4 w/ decrease in size of lfuid collection w/ catheter in favorable position -Ileostomy functioning, tolerating a diet.Prealbumin improving. TPN off - wound dehisced, monitor closely. BID wet  to dry dressing changes with adaptic in the base  - abdominal binder - oob, should be ready for SNF placementwhen bed available - Surgical drain removed LLQ 1/4  FEN:CM diet and supplements TWK:[id number]>> 12/28 ID: IV Zosyn 12/6>>12/27, IV diflucan 12/15>>12/27. Afebrile. WBC 10.5 Follow up: Dr. Dwain Sarna   LOS: 29 days    Jacinto Halim , [facility] Surgery 01/18/2019, 8:35 AM Please see Amion for pager number during day hours 7:00am-4:30pm

## 2019-01-18 NOTE — Plan of Care (Signed)

## 2019-01-18 NOTE — Progress Notes (Signed)
PROGRESS NOTE    JETTIE LAZARE  AYT:[id number] DOB: [birth year] DOA: 12/19/2018 PCP: Denita Lung, MD Outpatient Specialists:    Brief Narrative:   Pt. With history of NICM, EF 25-30%, PSVT, PAF, chronic systolic HF with AICD, hx of DVT/PE/LV thrombus , on chronic anticoagulation, was admitted 12/20/2018 for acute diverticulitis. He was initially treated conservatively, but had no real improvement and was taken to the OR 12/28/2018. Pt had Left colonic stricture of unknown etiology, and perforated cecum. Per surgery's note there was a mass in the mid descending colon (pathology negative for malignancy). The right colon was ischemic and had perforated. There was visualization of leakage of a large amount of stool throughout his entire abdominal cavity. In OR patient became hypotensive, required pressors (Phenylephrine and Levo). Surgery felt the patient was not ready for extubation, and considering co morbidities and possible septic insult asked PCCM to admit to ICU manage care. Transferred to [facility] service on 01/03/2019 now off pressors.    Today 01/18/19: stable and doing well, we are waiting on SNF bed offer and still planning to repeat CT Abd per surgery and radiology. I think he is ok to transfer off telemetry today.     Assessment & Plan:   Principal Problem:   Acute diverticulitis Active Problems:   Obesity, morbid (HCC)   Nonischemic cardiomyopathy (HCC)   Paroxysmal SVT (supraventricular tachycardia) (HCC)   Chronic systolic heart failure (HCC)   Chronic pain of right knee   History of DVT (deep vein thrombosis)   PAF (paroxysmal atrial fibrillation) (HCC)   Pre-operative cardiovascular examination   Bowel obstruction (HCC)   Respiratory failure (HCC)   Acute on chronic systolic heart failure (HCC)   Septic shock secondary to perforated cecum and contamination of abdominal cavity with stool s/p subtotal colectomy and ileostomy- resolved/recovered well   Peritoneal  abscess status post CT-guided drainage by interventional radiologist on 01/04/2019, Dr. Kathlene Cote. Management per interventional radiology; appreciate assistance  Plan to repeat CT of the abdomen once output is less than 10 cc.  dehisced abdominal wound. Wet-to-dry dressing changes twice a day per surgery.  TPNstopped12/30/2020.   Patient tolerating p.o. intake.  afebrile,s/p abscess drain, patient tolerating abdominal binder.   Patient was treated with Zosyn and Diflucan which has been stopped as of 01/09/2019  Acute hypoxic respiratory failure possibly secondary to postop atelectasis  Normal oxygen supplementation at baseline   on RA now; stable  Chronic combined systolic and diastolic CHF  negative by 2.7 L.  Last 2D echo LVEF 25 to 30%  Status post AICD  watch daily wts  willstartentresto when stabilizes  Appreciate recs from cardiology re po meds. Cardio has signed off today 01/17/19  Paroxysmal A. fib History of DVT/PE/LV thrombus  continue digoxin, amiodarone  Cardiology following   Eliquis restarted 01/10/2019.  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 Patient not a candidate for CIR at this time.  Consultants:   Surgery   Cardiology - s/o 01/16/2018, pt has f/u appt   radiology  Procedures:   01/04/19: CT Guided Drainage ofPeritoneal Abscess  12/28/18: Exploratory laparotomy, Subtotal colectomy, End ileostomy for Left colonic stricture of unknown etiology, perforated cecum  12/23/18: Flexible Sigmoidoscopy      Antimicrobials:  Anti-infectives (From admission, onward)   Start  Dose/Rate Route Frequency Ordered Stop   12/28/18 1400  fluconazole (DIFLUCAN) IVPB 400 mg     400 mg 100 mL/hr over 120 Minutes  Intravenous Every 24 hours 12/28/18 1318 01/09/19 2359   12/20/18 2200  piperacillin-tazobactam (ZOSYN) IVPB 3.375 g     3.375 g 12.5 mL/hr over 240 Minutes Intravenous Every 8 hours 12/20/18 1834 01/09/19 2359   12/20/18 0645  piperacillin-tazobactam (ZOSYN) IVPB 3.375 g  Status:  Discontinued     3.375 g 100 mL/hr over 30 Minutes Intravenous Every 6 hours 12/20/18 0630 12/20/18 1833   12/19/18 2315  piperacillin-tazobactam (ZOSYN) IVPB 3.375 g     3.375 g 100 mL/hr over 30 Minutes Intravenous  Once 12/19/18 2302 12/20/18 0003       Subjective: Patient reports feeling okay, recovering well, same as yesterday 01/15/18    Objective: Vitals:   01/17/19 2035 01/17/19 2343 01/18/19 0446 01/18/19 0912  BP: 98/63 101/62 102/63 97/69  Pulse: 81 78 78 79  Resp:    (!) 26  Temp: 98.7 F (37.1 C) 98.8 F (37.1 C) 97.7 F (36.5 C)   TempSrc: Oral Oral Oral   SpO2: 96% 93% 98% 95%  Weight:   135.6 kg   Height:        Intake/Output Summary (Last 24 hours) at 01/18/2019 1033 Last data filed at 01/18/2019 0526 Gross per 24 hour  Intake 582 ml  Output 2925 ml  Net -2343 ml   Filed Weights   01/16/19 0554 01/17/19 0446 01/18/19 0446  Weight: (!) 139.5 kg (!) 137.5 kg 135.6 kg    Examination:  General exam: Appears calm and comfortable  Respiratory system: Clear to auscultation. Respiratory effort normal. Cardiovascular system: S1 & S2 heard, RRR. No JVD, murmurs, rubs, gallops or clicks. No pedal edema. Gastrointestinal system: Abdomen is nondistended, soft and nontender. No organomegaly or masses felt. Exam limited d/t habitus and in binder  Central nervous system: Alert and oriented. No focal neurological deficits. Extremities: Symmetric movement  Skin: No rashes, lesions or ulcers Psychiatry: Judgement and insight appear normal. Mood & affect appropriate.     Data Reviewed: I have personally reviewed following labs and imaging studies  CBC: Recent Labs  Lab  01/14/19 0558 01/15/19 0415 01/16/19 0615 01/17/19 0616 01/18/19 0740  WBC 11.0* 10.5 10.0 10.4 10.5  NEUTROABS  --   --   --  7.0  --   HGB 9.6* 9.6* 9.5* 9.7* 10.0*  HCT 31.1* 29.8* 29.9* 30.5* 32.1*  MCV 92.0 89.0 89.0 88.7 89.9  PLT 299 327 317 326 338   Basic Metabolic Panel: Recent Labs  Lab 01/13/19 0500 01/14/19 0558 01/16/19 1314 01/17/19 0616 01/18/19 0740  NA 134* 136 135 135 138  K 4.7 4.4 3.9 3.8 4.0  CL 101 102 100 97* 100  CO2 25 25 25 26 26   GLUCOSE 140* 112* 148* 109* 113*  BUN 19 20 19 17 17   CREATININE 1.09 1.02 1.19 1.07 1.15  CALCIUM 8.0* 8.1* 7.9* 8.2* 8.3*  MG 1.8  --   --  1.7  --   PHOS 4.2  --   --  3.7  --    GFR: Estimated Creatinine Clearance: 98.7 mL/min (by C-G formula based on SCr of 1.15 mg/dL). Liver Function Tests: Recent Labs  Lab 01/13/19 0500 01/17/19 0616  AST 23 18  ALT 18 17  ALKPHOS 65 59  BILITOT 0.5 0.3  PROT 6.3* 6.9  ALBUMIN 1.4* 1.5*   No results for input(s):  LIPASE, AMYLASE in the last 168 hours. No results for input(s): AMMONIA in the last 168 hours. Coagulation Profile: No results for input(s): INR, PROTIME in the last 168 hours. Cardiac Enzymes: No results for input(s): CKTOTAL, CKMB, CKMBINDEX, TROPONINI in the last 168 hours. BNP (last 3 results) No results for input(s): PROBNP in the last 8760 hours. HbA1C: No results for input(s): HGBA1C in the last 72 hours. CBG: Recent Labs  Lab 01/17/19 0732 01/17/19 1204 01/17/19 1622 01/17/19 2109 01/18/19 0854  GLUCAP 110* 142* 144* 138* 123*   Lipid Profile: Recent Labs    01/17/19 0616  TRIG 58   Thyroid Function Tests: No results for input(s): TSH, T4TOTAL, FREET4, T3FREE, THYROIDAB in the last 72 hours. Anemia Panel: No results for input(s): VITAMINB12, FOLATE, FERRITIN, TIBC, IRON, RETICCTPCT in the last 72 hours. Urine analysis:    Component Value Date/Time   COLORURINE YELLOW 12/19/2018 2038   APPEARANCEUR CLEAR 12/19/2018 2038    LABSPEC >1.030 (H) 12/19/2018 2038   LABSPEC 1.025 03/18/2017 1337   PHURINE 5.5 12/19/2018 2038   GLUCOSEU NEGATIVE 12/19/2018 2038   HGBUR MODERATE (A) 12/19/2018 2038   BILIRUBINUR SMALL (A) 12/19/2018 2038   BILIRUBINUR negative 03/18/2017 1337   BILIRUBINUR neg 12/17/2015 0944   KETONESUR NEGATIVE 12/19/2018 2038   PROTEINUR >300 (A) 12/19/2018 2038   UROBILINOGEN negative 12/17/2015 0944   UROBILINOGEN 0.2 12/16/2011 1208   NITRITE NEGATIVE 12/19/2018 2038   LEUKOCYTESUR NEGATIVE 12/19/2018 2038   Sepsis Labs: @LABRCNTIP (procalcitonin:4,lacticidven:4)  No results found for this or any previous visit (from the past 240 hour(s)).       Radiology Studies last 96 hours: CT ABDOMEN PELVIS W CONTRAST  Result Date: 01/17/2019 CLINICAL DATA:  Right lateral abscess strain EXAM: CT ABDOMEN AND PELVIS WITH CONTRAST TECHNIQUE: Multidetector CT imaging of the abdomen and pelvis was performed using the standard protocol following bolus administration of intravenous contrast. CONTRAST:  03/17/2019 OMNIPAQUE IOHEXOL 300 MG/ML  SOLN COMPARISON:  CT abdomen pelvis 01/03/2019 FINDINGS: Lower chest: Resolved pleural effusions with some minimal residual atelectatic changes in the lung bases. Cardiac pacer/defibrillator leads terminating at the right atrium and cardiac apex. Borderline cardiomegaly is similar to prior. Hepatobiliary: Stable appearance of a 3.8 cm hypoattenuating lesion along the posterior right lobe liver additional subcentimeter hypoattenuating foci within the liver are also remains stable but are incompletely characterized on this exam. Redemonstration of a complex air and fluid containing collection on the right anterolateral aspect of the liver which extends superiorly to the gallbladder fossa with associated pericholecystic inflammation. Overall size of this collection is diminished from comparison exam measuring approximately 9.9 x 1.7 x 11 cm in maximal dimensions. Gallbladder is  normally distended with few layering hyperdense gallstones similar to comparison study. Pancreas: Unremarkable. No pancreatic ductal dilatation or surrounding inflammatory changes. Spleen: Normal in size without focal abnormality. Adrenals/Urinary Tract: The adrenal glands are unremarkable. Few areas of cortical scarring are noted in the right kidney. No worrisome renal lesions or urolithiasis. The urinary bladder is mildly thickened with some gradient attenuation to the bladder contents as well as anti dependently layering hypodense material, nonspecific in appearance. Stomach/Bowel: Distal esophagus, stomach and duodenal sweep are unremarkable. Decreasing distention of the distal small bowel. Several of the small bowel loops appear closely apposed to the anterior abdominal wall at the site of the abdominal incision left to secondary intention. Postsurgical changes from subtotal colectomy with residual Hartmann's pouch. A right lower quadrant end ileostomy is noted. Vascular/Lymphatic: The aorta is normal caliber.  Reactive adenopathy in the mid mesentery. Reproductive: The prostate and seminal vesicles are unremarkable. Other: Decreasing size of the right upper quadrant air and fluid containing abscess along the lateral margin of the liver extending in the right paracolic gutter. A pigtail catheter terminates appropriately within this collection. There are scattered areas of diffuse mesenteric edema as well as a more focal regions of phlegmon in the right lower quadrant (3/57 and 3/49) which could reflect areas of mesenteric edema or developing fat necrosis related to recent operation. Anterior abdominal incision overlying bandaging material likely reflecting a VAC dressing. A right lower quadrant end ileostomy is noted. A right upper quadrant pigtail drain is present as well. Diminishing lateral subcutaneous abdominal wall stranding and edema. Musculoskeletal: No acute osseous abnormality or suspicious osseous  lesion. Partial fusion of the right SI joint, similar to prior. IMPRESSION: 1. Decreasing size of the right upper quadrant air and fluid containing abscess along the lateral margin of the liver which extends to the gallbladder fossa superiorly and into the right pericolic gutter inferiorly. A pigtail catheter drain is positioned within this collection. 2. Cholelithiasis with mild pericholecystic inflammation, favor secondary to reactive inflammation. 3. Irregular thickening of the urinary bladder with gradient density of the bladder contents and anti dependently layering hypodense material. Nonspecific though worrisome for a cystitis, could correlate with urinalysis. 4. Decreasing distention of the distal small bowel. 5. Scattered areas of diffuse mesenteric edema as well as a more focal regions of phlegmon in the right lower quadrant which could reflect areas of mesenteric edema or developing fat necrosis related to recent operation. 6. Resolved pleural effusions with some minimal residual atelectatic changes in the lung bases. 7. Diminishing body wall edema. Electronically Signed   By: Kreg Shropshire M.D.   On: 01/17/2019 23:54         Scheduled Meds: . acetaminophen  650 mg Oral Q6H  . amiodarone  200 mg Oral Daily  . apixaban  5 mg Oral BID  . chlorhexidine  15 mL Mouth Rinse BID  . Chlorhexidine Gluconate Cloth  6 each Topical Daily  . digoxin  0.125 mg Oral Daily  . feeding supplement (ENSURE ENLIVE)  237 mL Oral TID BM  . feeding supplement (PRO-STAT SUGAR FREE 64)  30 mL Oral TID BM  . insulin aspart  0-20 Units Subcutaneous TID WC  . insulin aspart  0-5 Units Subcutaneous QHS  . mouth rinse  15 mL Mouth Rinse q12n4p  . methocarbamol  500 mg Oral TID  . multivitamin with minerals  1 tablet Oral Daily  . pantoprazole  40 mg Oral Q1200  . sodium chloride flush  10-40 mL Intracatheter Q12H  . sodium chloride flush  5 mL Intracatheter Q8H  . torsemide  20 mg Oral Daily   Continuous  Infusions: . sodium chloride 10 mL/hr at 01/09/19 1800  . sodium chloride       LOS: 29 days    Time spent: 35 mins     Sunnie Nielsen DO [facility] Pager [phone] or secure message in Epic

## 2019-01-18 NOTE — Consult Note (Signed)
WOC Nurse ostomy follow up Patient receiving care in  [facility] 6E26. Stoma type/location:  RLQ ileostomy Stomal assessment/size: 1 3/8 inches round Peristomal assessment: intact. Mucocutaneous separation has filled in, re-epithelializing Treatment options for stomal/peristomal skin: barrier ring Output: liquid brown  Ostomy pouching: 1pc. CONVEX Education provided: site care, change process, preparing new pouch, applying. Patient able to open/close sample pouch. Patient cannot see his stoma.  He will not be able to perform ostomy care independently until he can get out of the bed and in front of a mirror. Enrolled patient in [place] Secure Start Discharge program: Yes Helmut Muster, RN, MSN, [facility], CNS-BC, pager [phone]

## 2019-01-18 NOTE — Progress Notes (Signed)
Referring Physician(s): Meuth, Blaine Hamper (CCS)  Supervising Physician: Corrie Mckusick  Patient Status:  [facility] - In-pt  Chief Complaint: None  Subjective:  History of colonic perforation s/p exploratory laporatomy with total colectomy and ileostomy in OR 12/28/2018 by Dr. Donne Hazel, with subsequent development of right lateral peritoneal fluid collection s/p right lateral abdominal drain placement in IR 01/04/2019 by Dr. Kathlene Cote. Patient awake and alert laying in eating lunch with no complaints at this time. Right lateral abdominal drain site c/d/i.   Allergies: Shrimp [shellfish allergy]  Medications: Prior to Admission medications   Medication Sig Start Date End Date Taking? Authorizing Provider  acetaminophen (TYLENOL) 650 MG CR tablet Take 1,300 mg by mouth every 8 (eight) hours as needed for pain.   Yes [provider]  amiodarone (PACERONE) 200 MG tablet Take 1 tablet (200 mg total) by mouth daily. 10/18/18  Yes Bensimhon, Shaune Pascal, MD  coconut oil OIL Apply 1 application topically as needed (dry skin).    Yes [provider]  digoxin (LANOXIN) 0.125 MG tablet TAKE 1 TABLET(0.125 MG) BY MOUTH DAILY Patient taking differently: Take 0.125 mg by mouth daily.  11/01/18  Yes Bensimhon, Shaune Pascal, MD  ELIQUIS 5 MG TABS tablet TAKE 1 TABLET(5 MG) BY MOUTH TWICE DAILY Patient taking differently: Take 5 mg by mouth 2 (two) times daily.  03/22/18  Yes Bensimhon, Shaune Pascal, MD  ENTRESTO 49-51 MG TAKE 1 TABLET BY MOUTH TWICE DAILY Patient taking differently: Take 1 tablet by mouth 2 (two) times daily.  09/15/18  Yes Bensimhon, Shaune Pascal, MD  potassium chloride SA (KLOR-CON) 20 MEQ tablet Take 2 tablets (40 mEq total) by mouth 2 (two) times daily. 10/18/18  Yes Bensimhon, Shaune Pascal, MD  spironolactone (ALDACTONE) 25 MG tablet TAKE 1 TABLET(25 MG) BY MOUTH DAILY Patient taking differently: Take 25 mg by mouth daily.  12/03/18  Yes Denita Lung, MD  torsemide (DEMADEX) 20 MG  tablet TAKE 2 TABLETS BY MOUTH TWICE DAILY. STOP LASIX Patient taking differently: Take 40 mg by mouth 2 (two) times daily.  06/29/18  Yes Bensimhon, Shaune Pascal, MD     Vital Signs: BP 97/69 (BP Location: Left Arm)   Pulse 77   Temp 97.7 F (36.5 C) (Oral)   Resp 18   Ht 5\' 7"  (1.702 m)   Wt 298 lb 15.1 oz (135.6 kg)   SpO2 96%   BMI 46.82 kg/m   Physical Exam Vitals and nursing note reviewed.  Constitutional:      General: He is not in acute distress.    Appearance: Normal appearance.  Pulmonary:     Effort: Pulmonary effort is normal. No respiratory distress.  Abdominal:     Comments: (+) Abdominal binder. (+) Midline surgical wound, well dressed. Right lateral abdominal drain site without tenderness, erythema, drainage, or active bleeding; approximately 25 cc of thick tan fluid with debris in suction bulb; drain flushes/aspirates without resistance.   Skin:    General: Skin is warm and dry.  Neurological:     Mental Status: He is alert and oriented to person, place, and time.  Psychiatric:        Mood and Affect: Mood normal.        Behavior: Behavior normal.     Imaging: CT ABDOMEN PELVIS W CONTRAST  Result Date: 01/17/2019 CLINICAL DATA:  Right lateral abscess strain EXAM: CT ABDOMEN AND PELVIS WITH CONTRAST TECHNIQUE: Multidetector CT imaging of the abdomen and pelvis was performed using the standard  protocol following bolus administration of intravenous contrast. CONTRAST:  OMNIPAQUE IOHEXOL 300 MG/ML  SOLN COMPARISON:  CT abdomen pelvis 01/03/2019 FINDINGS: Lower chest: Resolved pleural effusions with some minimal residual atelectatic changes in the lung bases. Cardiac pacer/defibrillator leads terminating at the right atrium and cardiac apex. Borderline cardiomegaly is similar to prior. Hepatobiliary: Stable appearance of a 3.8 cm hypoattenuating lesion along the posterior right lobe liver additional subcentimeter hypoattenuating foci within the liver are also  remains stable but are incompletely characterized on this exam. Redemonstration of a complex air and fluid containing collection on the right anterolateral aspect of the liver which extends superiorly to the gallbladder fossa with associated pericholecystic inflammation. Overall size of this collection is diminished from comparison exam measuring approximately 9.9 x 1.7 x 11 cm in maximal dimensions. Gallbladder is normally distended with few layering hyperdense gallstones similar to comparison study. Pancreas: Unremarkable. No pancreatic ductal dilatation or surrounding inflammatory changes. Spleen: Normal in size without focal abnormality. Adrenals/Urinary Tract: The adrenal glands are unremarkable. Few areas of cortical scarring are noted in the right kidney. No worrisome renal lesions or urolithiasis. The urinary bladder is mildly thickened with some gradient attenuation to the bladder contents as well as anti dependently layering hypodense material, nonspecific in appearance. Stomach/Bowel: Distal esophagus, stomach and duodenal sweep are unremarkable. Decreasing distention of the distal small bowel. Several of the small bowel loops appear closely apposed to the anterior abdominal wall at the site of the abdominal incision left to secondary intention. Postsurgical changes from subtotal colectomy with residual Hartmann's pouch. A right lower quadrant end ileostomy is noted. Vascular/Lymphatic: The aorta is normal caliber. Reactive adenopathy in the mid mesentery. Reproductive: The prostate and seminal vesicles are unremarkable. Other: Decreasing size of the right upper quadrant air and fluid containing abscess along the lateral margin of the liver extending in the right paracolic gutter. A pigtail catheter terminates appropriately within this collection. There are scattered areas of diffuse mesenteric edema as well as a more focal regions of phlegmon in the right lower quadrant (3/57 and 3/49) which could  reflect areas of mesenteric edema or developing fat necrosis related to recent operation. Anterior abdominal incision overlying bandaging material likely reflecting a VAC dressing. A right lower quadrant end ileostomy is noted. A right upper quadrant pigtail drain is present as well. Diminishing lateral subcutaneous abdominal wall stranding and edema. Musculoskeletal: No acute osseous abnormality or suspicious osseous lesion. Partial fusion of the right SI joint, similar to prior. IMPRESSION: 1. Decreasing size of the right upper quadrant air and fluid containing abscess along the lateral margin of the liver which extends to the gallbladder fossa superiorly and into the right pericolic gutter inferiorly. A pigtail catheter drain is positioned within this collection. 2. Cholelithiasis with mild pericholecystic inflammation, favor secondary to reactive inflammation. 3. Irregular thickening of the urinary bladder with gradient density of the bladder contents and anti dependently layering hypodense material. Nonspecific though worrisome for a cystitis, could correlate with urinalysis. 4. Decreasing distention of the distal small bowel. 5. Scattered areas of diffuse mesenteric edema as well as a more focal regions of phlegmon in the right lower quadrant which could reflect areas of mesenteric edema or developing fat necrosis related to recent operation. 6. Resolved pleural effusions with some minimal residual atelectatic changes in the lung bases. 7. Diminishing body wall edema. Electronically Signed   By: Kreg Shropshire M.D.   On: 01/17/2019 23:54    Labs:  CBC: Recent Labs    01/15/19  1655 01/16/19 0615 01/17/19 0616 01/18/19 0740  WBC 10.5 10.0 10.4 10.5  HGB 9.6* 9.5* 9.7* 10.0*  HCT 29.8* 29.9* 30.5* 32.1*  PLT 327 317 326 338    COAGS: Recent Labs    12/28/18 0447 12/28/18 1410 12/29/18 0500 12/30/18 0447 12/30/18 1639  INR  --  1.5*  --   --   --   APTT 106*  --  29 32 78*     BMP: Recent Labs    01/14/19 0558 01/16/19 1314 01/17/19 0616 01/18/19 0740  NA 136 135 135 138  K 4.4 3.9 3.8 4.0  CL 102 100 97* 100  CO2 25 25 26 26   GLUCOSE 112* 148* 109* 113*  BUN 20 19 17 17   CALCIUM 8.1* 7.9* 8.2* 8.3*  CREATININE 1.02 1.19 1.07 1.15  GFRNONAA >60 >60 >60 >60  GFRAA >60 >60 >60 >60    LIVER FUNCTION TESTS: Recent Labs    01/08/19 0505 01/10/19 0442 01/11/19 0500 01/13/19 0500 01/17/19 0616  BILITOT 0.2*  --  0.4 0.5 0.3  AST 21  --  22 23 18   ALT 15  --  14 18 17   ALKPHOS 44  --  59 65 59  PROT 5.8*  --  6.4* 6.3* 6.9  ALBUMIN 1.2* 1.2* 1.3* 1.4* 1.5*    Assessment and Plan:  History of colonic perforation s/p exploratory laporatomy with total colectomy and ileostomy in OR 12/28/2018 by Dr. 03/17/19, with subsequent development of right lateral peritoneal fluid collection s/p right lateral abdominal drain placement in IR 01/04/2019 by Dr. . Right lateral abdominal drain stable with approximately 25 cc of thick tan fluid with debris in suction bulb. CT abdomen/pelvis 01/17/2019 reviewed by Dr. Dwain Sarna today who states fluid collection has improved but still present- drain to remain at this time. Continue current drain management- continue with Qshift flushes/monitor of output. Further plans per [facility]/CCS- appreciate and agree with management. IR to follow.   Electronically Signed: 01/06/2019, PA-C 01/18/2019, 2:32 PM   I spent a total of 25 Minutes at the the patient's bedside AND on the patient's hospital floor or unit, greater than 50% of which was counseling/coordinating care for right lateral peritoneal fluid collection s/p drain placement.

## 2019-01-18 NOTE — Progress Notes (Signed)
PICC dressing changed by Cammy Copa, RN and Neysa Bonito, RN.

## 2019-01-19 ENCOUNTER — Inpatient Hospital Stay (HOSPITAL_COMMUNITY): Payer: BC Managed Care – PPO

## 2019-01-19 LAB — CBC
HCT: 33.3 % — ABNORMAL LOW (ref 39.0–52.0)
Hemoglobin: 10.3 g/dL — ABNORMAL LOW (ref 13.0–17.0)
MCH: 27.8 pg (ref 26.0–34.0)
MCHC: 30.9 g/dL (ref 30.0–36.0)
MCV: 89.8 fL (ref 80.0–100.0)
Platelets: 339 10*3/uL (ref 150–400)
RBC: 3.71 MIL/uL — ABNORMAL LOW (ref 4.22–5.81)
RDW: 14.2 % (ref 11.5–15.5)
WBC: 11.4 10*3/uL — ABNORMAL HIGH (ref 4.0–10.5)
nRBC: 0 % (ref 0.0–0.2)

## 2019-01-19 LAB — GLUCOSE, CAPILLARY
Glucose-Capillary: 109 mg/dL — ABNORMAL HIGH (ref 70–99)
Glucose-Capillary: 136 mg/dL — ABNORMAL HIGH (ref 70–99)
Glucose-Capillary: 93 mg/dL (ref 70–99)
Glucose-Capillary: 159 mg/dL — ABNORMAL HIGH (ref 70–99)

## 2019-01-19 MED ORDER — GUAIFENESIN ER 600 MG PO TB12
600.0000 mg | ORAL_TABLET | Freq: Two times a day (BID) | ORAL | Status: DC
  Administered 2019-01-19 – 2019-01-24 (×10): 600 mg via ORAL
  Filled 2019-01-19 (×10): qty 1

## 2019-01-19 NOTE — Progress Notes (Signed)
Referring Physician(s): Dr. Dema Severin  Supervising Physician: Jacqulynn Cadet  Patient Status:  [facility] - In-pt  Chief Complaint: Intra-abdominal fluid collection  Subjective: Patient sitting comfortably in chair.   Surgical drain removed 1/4.  IR drain remains in place.  Hopefully for discharge home soon.   Allergies: Shrimp [shellfish allergy]  Medications: Prior to Admission medications   Medication Sig Start Date End Date Taking? Authorizing Provider  acetaminophen (TYLENOL) 650 MG CR tablet Take 1,300 mg by mouth every 8 (eight) hours as needed for pain.   Yes [provider]  amiodarone (PACERONE) 200 MG tablet Take 1 tablet (200 mg total) by mouth daily. 10/18/18  Yes Bensimhon, Shaune Pascal, MD  coconut oil OIL Apply 1 application topically as needed (dry skin).    Yes [provider]  digoxin (LANOXIN) 0.125 MG tablet TAKE 1 TABLET(0.125 MG) BY MOUTH DAILY Patient taking differently: Take 0.125 mg by mouth daily.  11/01/18  Yes Bensimhon, Shaune Pascal, MD  ELIQUIS 5 MG TABS tablet TAKE 1 TABLET(5 MG) BY MOUTH TWICE DAILY Patient taking differently: Take 5 mg by mouth 2 (two) times daily.  03/22/18  Yes Bensimhon, Shaune Pascal, MD  ENTRESTO 49-51 MG TAKE 1 TABLET BY MOUTH TWICE DAILY Patient taking differently: Take 1 tablet by mouth 2 (two) times daily.  09/15/18  Yes Bensimhon, Shaune Pascal, MD  potassium chloride SA (KLOR-CON) 20 MEQ tablet Take 2 tablets (40 mEq total) by mouth 2 (two) times daily. 10/18/18  Yes Bensimhon, Shaune Pascal, MD  spironolactone (ALDACTONE) 25 MG tablet TAKE 1 TABLET(25 MG) BY MOUTH DAILY Patient taking differently: Take 25 mg by mouth daily.  12/03/18  Yes Denita Lung, MD  torsemide (DEMADEX) 20 MG tablet TAKE 2 TABLETS BY MOUTH TWICE DAILY. STOP LASIX Patient taking differently: Take 40 mg by mouth 2 (two) times daily.  06/29/18  Yes Bensimhon, Shaune Pascal, MD     Vital Signs: BP 95/63 (BP Location: Left Arm)   Pulse 73   Temp (!) 97.5 F  (36.4 C) (Oral)   Resp 18   Ht 5\' 7"  (1.702 m)   Wt (!) 300 lb 11.3 oz (136.4 kg)   SpO2 96%   BMI 47.10 kg/m   Physical Exam  NAD, alert Abdomen: soft, non-tender.  Right lateral drain in place.  Turbid output in collection bulb.  Insertion site c/d/i.   Imaging: CT ABDOMEN PELVIS W CONTRAST  Result Date: 01/17/2019 CLINICAL DATA:  Right lateral abscess strain EXAM: CT ABDOMEN AND PELVIS WITH CONTRAST TECHNIQUE: Multidetector CT imaging of the abdomen and pelvis was performed using the standard protocol following bolus administration of intravenous contrast. CONTRAST:  156mL OMNIPAQUE IOHEXOL 300 MG/ML  SOLN COMPARISON:  CT abdomen pelvis 01/03/2019 FINDINGS: Lower chest: Resolved pleural effusions with some minimal residual atelectatic changes in the lung bases. Cardiac pacer/defibrillator leads terminating at the right atrium and cardiac apex. Borderline cardiomegaly is similar to prior. Hepatobiliary: Stable appearance of a 3.8 cm hypoattenuating lesion along the posterior right lobe liver additional subcentimeter hypoattenuating foci within the liver are also remains stable but are incompletely characterized on this exam. Redemonstration of a complex air and fluid containing collection on the right anterolateral aspect of the liver which extends superiorly to the gallbladder fossa with associated pericholecystic inflammation. Overall size of this collection is diminished from comparison exam measuring approximately 9.9 x 1.7 x 11 cm in maximal dimensions. Gallbladder is normally distended with few layering hyperdense gallstones similar to comparison study. Pancreas: Unremarkable.  No pancreatic ductal dilatation or surrounding inflammatory changes. Spleen: Normal in size without focal abnormality. Adrenals/Urinary Tract: The adrenal glands are unremarkable. Few areas of cortical scarring are noted in the right kidney. No worrisome renal lesions or urolithiasis. The urinary bladder is mildly  thickened with some gradient attenuation to the bladder contents as well as anti dependently layering hypodense material, nonspecific in appearance. Stomach/Bowel: Distal esophagus, stomach and duodenal sweep are unremarkable. Decreasing distention of the distal small bowel. Several of the small bowel loops appear closely apposed to the anterior abdominal wall at the site of the abdominal incision left to secondary intention. Postsurgical changes from subtotal colectomy with residual Hartmann's pouch. A right lower quadrant end ileostomy is noted. Vascular/Lymphatic: The aorta is normal caliber. Reactive adenopathy in the mid mesentery. Reproductive: The prostate and seminal vesicles are unremarkable. Other: Decreasing size of the right upper quadrant air and fluid containing abscess along the lateral margin of the liver extending in the right paracolic gutter. A pigtail catheter terminates appropriately within this collection. There are scattered areas of diffuse mesenteric edema as well as a more focal regions of phlegmon in the right lower quadrant (3/57 and 3/49) which could reflect areas of mesenteric edema or developing fat necrosis related to recent operation. Anterior abdominal incision overlying bandaging material likely reflecting a VAC dressing. A right lower quadrant end ileostomy is noted. A right upper quadrant pigtail drain is present as well. Diminishing lateral subcutaneous abdominal wall stranding and edema. Musculoskeletal: No acute osseous abnormality or suspicious osseous lesion. Partial fusion of the right SI joint, similar to prior. IMPRESSION: 1. Decreasing size of the right upper quadrant air and fluid containing abscess along the lateral margin of the liver which extends to the gallbladder fossa superiorly and into the right pericolic gutter inferiorly. A pigtail catheter drain is positioned within this collection. 2. Cholelithiasis with mild pericholecystic inflammation, favor secondary to  reactive inflammation. 3. Irregular thickening of the urinary bladder with gradient density of the bladder contents and anti dependently layering hypodense material. Nonspecific though worrisome for a cystitis, could correlate with urinalysis. 4. Decreasing distention of the distal small bowel. 5. Scattered areas of diffuse mesenteric edema as well as a more focal regions of phlegmon in the right lower quadrant which could reflect areas of mesenteric edema or developing fat necrosis related to recent operation. 6. Resolved pleural effusions with some minimal residual atelectatic changes in the lung bases. 7. Diminishing body wall edema. Electronically Signed   By: Kreg Shropshire M.D.   On: 01/17/2019 23:54    Labs:  CBC: Recent Labs    01/16/19 0615 01/17/19 0616 01/18/19 0740 01/19/19 0426  WBC 10.0 10.4 10.5 11.4*  HGB 9.5* 9.7* 10.0* 10.3*  HCT 29.9* 30.5* 32.1* 33.3*  PLT 317 326 338 339    COAGS: Recent Labs    12/28/18 0447 12/28/18 1410 12/29/18 0500 12/30/18 0447 12/30/18 1639  INR  --  1.5*  --   --   --   APTT 106*  --  29 32 78*    BMP: Recent Labs    01/14/19 0558 01/16/19 1314 01/17/19 0616 01/18/19 0740  NA 136 135 135 138  K 4.4 3.9 3.8 4.0  CL 102 100 97* 100  CO2 25 25 26 26   GLUCOSE 112* 148* 109* 113*  BUN 20 19 17 17   CALCIUM 8.1* 7.9* 8.2* 8.3*  CREATININE 1.02 1.19 1.07 1.15  GFRNONAA >60 >60 >60 >60  GFRAA >60 >60 >60 >60  LIVER FUNCTION TESTS: Recent Labs    01/08/19 0505 01/10/19 0442 01/11/19 0500 01/13/19 0500 01/17/19 0616  BILITOT 0.2*  --  0.4 0.5 0.3  AST 21  --  22 23 18   ALT 15  --  14 18 17   ALKPHOS 44  --  59 65 59  PROT 5.8*  --  6.4* 6.3* 6.9  ALBUMIN 1.2* 1.2* 1.3* 1.4* 1.5*    Assessment and Plan: Colonic perforation s/p ex lap 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 12/30/18.  Patient with right lateral drain in place.  Output  continues; 25 mL recorded yesterday.  Appears turbid.  CT 1/4 showed persistent collection.  Drain will need to remain in place at this time.  Patient nearing discharge.   Continue current management with daily flushes of 5-10 mL sterile saline. Continue to record output daily.  Outpatient follow-up instructions placed.  Patient will hear from schedulers to arranged follow-up appointment with IR.  IR following.   Electronically Signed: 01/06/19, PA 01/19/2019, 11:47 AM   I spent a total of 15 Minutes at the the patient's bedside AND on the patient's hospital floor or unit, greater than 50% of which was counseling/coordinating care for intra-abdominal fluid collection.

## 2019-01-19 NOTE — Progress Notes (Signed)
22 Days Post-Op  Subjective: CC: Doing well. Tolerating a diet without n/v. No abdominal pain. Having bowel function. Looking forward to working with therapies today.   Objective: Vital signs in last 24 hours: Temp:  [97.5 F (36.4 C)-99 F (37.2 C)] 97.5 F (36.4 C) (01/06 0752) Pulse Rate:  [73-80] 73 (01/06 0752) Resp:  [18-26] 18 (01/06 0752) BP: (92-99)/(61-70) 95/63 (01/06 0752) SpO2:  [92 %-97 %] 96 % (01/06 0752) Weight:  [136.4 kg] 136.4 kg (01/06 0427) Last BM Date: 01/17/19(ileostomy)  Intake/Output from previous day: 01/05 0701 - 01/06 0700 In: 444 [P.O.:444] Out: 1850 [Urine:1725; Drains:25; Stool:100] Intake/Output this shift: Total I/O In: 240 [P.O.:240] Out: 200 [Stool:200]  PE: Gen: Alert, NAD, pleasant Pulm:Normal rate and effort Abd: Soft, NT/ND, +BS,drain site LLQ c/d/i. RLQ drain with thick tan fluid. Midline wound dehisced without evisceration. Healthy granulation tissue at base and on sides. Adaptic at base. Stable from picture monday.  Skin: no rashes noted, warm and dry  Lab Results:  Recent Labs    01/18/19 0740 01/19/19 0426  WBC 10.5 11.4*  HGB 10.0* 10.3*  HCT 32.1* 33.3*  PLT 338 339   BMET Recent Labs    01/17/19 0616 01/18/19 0740  NA 135 138  K 3.8 4.0  CL 97* 100  CO2 26 26  GLUCOSE 109* 113*  BUN 17 17  CREATININE 1.07 1.15  CALCIUM 8.2* 8.3*   PT/INR No results for input(s): LABPROT, INR in the last 72 hours. CMP     Component Value Date/Time   NA 138 01/18/2019 0740   NA 140 08/26/2018 1355   K 4.0 01/18/2019 0740   CL 100 01/18/2019 0740   CO2 26 01/18/2019 0740   GLUCOSE 113 (H) 01/18/2019 0740   BUN 17 01/18/2019 0740   BUN 14 08/26/2018 1355   CREATININE 1.15 01/18/2019 0740   CREATININE 1.20 10/29/2016 1236   CALCIUM 8.3 (L) 01/18/2019 0740   PROT 6.9 01/17/2019 0616   PROT 7.4 12/03/2018 1459   ALBUMIN 1.5 (L) 01/17/2019 0616   ALBUMIN 3.6 (L) 12/03/2018 1459   AST 18 01/17/2019 0616   ALT 17 01/17/2019 0616   ALKPHOS 59 01/17/2019 0616   BILITOT 0.3 01/17/2019 0616   BILITOT 0.3 12/03/2018 1459   GFRNONAA >60 01/18/2019 0740   GFRAA >60 01/18/2019 0740   Lipase     Component Value Date/Time   LIPASE 27 12/19/2018 2038       Studies/Results: CT ABDOMEN PELVIS W CONTRAST  Result Date: 01/17/2019 CLINICAL DATA:  Right lateral abscess strain EXAM: CT ABDOMEN AND PELVIS WITH CONTRAST TECHNIQUE: Multidetector CT imaging of the abdomen and pelvis was performed using the standard protocol following bolus administration of intravenous contrast. CONTRAST:  OMNIPAQUE IOHEXOL 300 MG/ML  SOLN COMPARISON:  CT abdomen pelvis 01/03/2019 FINDINGS: Lower chest: Resolved pleural effusions with some minimal residual atelectatic changes in the lung bases. Cardiac pacer/defibrillator leads terminating at the right atrium and cardiac apex. Borderline cardiomegaly is similar to prior. Hepatobiliary: Stable appearance of a 3.8 cm hypoattenuating lesion along the posterior right lobe liver additional subcentimeter hypoattenuating foci within the liver are also remains stable but are incompletely characterized on this exam. Redemonstration of a complex air and fluid containing collection on the right anterolateral aspect of the liver which extends superiorly to the gallbladder fossa with associated pericholecystic inflammation. Overall size of this collection is diminished from comparison exam measuring approximately 9.9 x 1.7 x 11 cm in maximal dimensions. Gallbladder  is normally distended with few layering hyperdense gallstones similar to comparison study. Pancreas: Unremarkable. No pancreatic ductal dilatation or surrounding inflammatory changes. Spleen: Normal in size without focal abnormality. Adrenals/Urinary Tract: The adrenal glands are unremarkable. Few areas of cortical scarring are noted in the right kidney. No worrisome renal lesions or urolithiasis. The urinary bladder is mildly  thickened with some gradient attenuation to the bladder contents as well as anti dependently layering hypodense material, nonspecific in appearance. Stomach/Bowel: Distal esophagus, stomach and duodenal sweep are unremarkable. Decreasing distention of the distal small bowel. Several of the small bowel loops appear closely apposed to the anterior abdominal wall at the site of the abdominal incision left to secondary intention. Postsurgical changes from subtotal colectomy with residual Hartmann's pouch. A right lower quadrant end ileostomy is noted. Vascular/Lymphatic: The aorta is normal caliber. Reactive adenopathy in the mid mesentery. Reproductive: The prostate and seminal vesicles are unremarkable. Other: Decreasing size of the right upper quadrant air and fluid containing abscess along the lateral margin of the liver extending in the right paracolic gutter. A pigtail catheter terminates appropriately within this collection. There are scattered areas of diffuse mesenteric edema as well as a more focal regions of phlegmon in the right lower quadrant (3/57 and 3/49) which could reflect areas of mesenteric edema or developing fat necrosis related to recent operation. Anterior abdominal incision overlying bandaging material likely reflecting a VAC dressing. A right lower quadrant end ileostomy is noted. A right upper quadrant pigtail drain is present as well. Diminishing lateral subcutaneous abdominal wall stranding and edema. Musculoskeletal: No acute osseous abnormality or suspicious osseous lesion. Partial fusion of the right SI joint, similar to prior. IMPRESSION: 1. Decreasing size of the right upper quadrant air and fluid containing abscess along the lateral margin of the liver which extends to the gallbladder fossa superiorly and into the right pericolic gutter inferiorly. A pigtail catheter drain is positioned within this collection. 2. Cholelithiasis with mild pericholecystic inflammation, favor secondary to  reactive inflammation. 3. Irregular thickening of the urinary bladder with gradient density of the bladder contents and anti dependently layering hypodense material. Nonspecific though worrisome for a cystitis, could correlate with urinalysis. 4. Decreasing distention of the distal small bowel. 5. Scattered areas of diffuse mesenteric edema as well as a more focal regions of phlegmon in the right lower quadrant which could reflect areas of mesenteric edema or developing fat necrosis related to recent operation. 6. Resolved pleural effusions with some minimal residual atelectatic changes in the lung bases. 7. Diminishing body wall edema. Electronically Signed   By: Kreg Shropshire M.D.   On: 01/17/2019 23:54    Anti-infectives: Anti-infectives (From admission, onward)   Start     Dose/Rate Route Frequency Ordered Stop   12/28/18 1400  fluconazole (DIFLUCAN) IVPB 400 mg     400 mg 100 mL/hr over 120 Minutes Intravenous Every 24 hours 12/28/18 1318 01/09/19 2359   12/20/18 2200  piperacillin-tazobactam (ZOSYN) IVPB 3.375 g     3.375 g 12.5 mL/hr over 240 Minutes Intravenous Every 8 hours 12/20/18 1834 01/09/19 2359   12/20/18 0645  piperacillin-tazobactam (ZOSYN) IVPB 3.375 g  Status:  Discontinued     3.375 g 100 mL/hr over 30 Minutes Intravenous Every 6 hours 12/20/18 0630 12/20/18 1833   12/19/18 2315  piperacillin-tazobactam (ZOSYN) IVPB 3.375 g     3.375 g 100 mL/hr over 30 Minutes Intravenous  Once 12/19/18 2302 12/20/18 0003       Assessment/Plan Left colonic stricture of  unknown etiology; with perforated cecum POD#22STC/ileostomy fordiverticular stricture of left colon and perforated R colon12/15/2020,Dr. Rolm Bookbinder -Pathwithout evidence of malignancy -S/p drain placement 12/22 - cxsE coli pansensitive,abx stopped 12/27.Repeat CT 1/4 w/ decrease in size of fluid collection w/ catheter in favorable position. IR rec leaving drain in place -Ileostomy functioning,  tolerating a diet.Prealbumin improving. TPN off - wound dehisced, monitor closely. BID wet to dry dressing changes with adaptic in the base - abdominal binder - oob, therapies, ready for SNF from surgical standpoint when bed available - Surgical drain removed LLQ 1/4  FEN:CM diet and supplements JTT:[id number]>> 12/28 ID: IV Zosyn 12/6>>12/27, IV diflucan 12/15>>12/27. Afebrile. WBC 11.4 Follow up: Dr. Donne Hazel   LOS: 30 days    Jillyn Ledger , [facility] 01/19/2019, 9:01 AM Please see Amion for pager number during day hours 7:00am-4:30pm

## 2019-01-19 NOTE — Progress Notes (Signed)
Physical Therapy Treatment Patient Details Name: Roberto [name] MRN: [id number] DOB: Oct 25, 1965 Today's Date: 01/19/2019    History of Present Illness Pt is a 54 y.o. M with history of NICM, EF 25-30%, PSVT, PAF, chronic systolic HF with AICD, DVT/PE/LV thrombus , on chronic anticoagulation who was admitted 12/20/2018 for acute diverticulitis. He was initially treated conservatively, but had no real improvement and was taken to the OR 12/28/2018.Pt was found to have a perforated cecum with leakage of stool throughout abdominal cavity. 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 He was brought from the OR to NICU intubated and on pressors. Extubated 12/16.    PT Comments    Patient received in bed, motivated and eager to participate in PT; tried wearing his shoes today to help reduce foot pain with mobility, which helped greatly. Able to perform bed mobility with S and extended time/use of railing, functional transfers with ModAx1 and second person stabilizing RW, and tolerated gait training approximately 70f with RW and close min guard. SpO2 WNL on room air, HR up to 117BPM and patient limited significantly by fatigue. He was left up in the chair with all needs met this morning. Made significant gains today and continues to progress very well with PT.    Follow Up Recommendations  SNF     Equipment Recommendations  Rolling walker with 5" wheels;3in1 (PT)    Recommendations for Other Services       Precautions / Restrictions Precautions Precautions: Fall;Other (comment) Precaution Comments: colostomy, JP drain R , needs abdominal binder Restrictions Weight Bearing Restrictions: No    Mobility  Bed Mobility Overal bed mobility: Needs Assistance Bed Mobility: Rolling;Sidelying to Sit Rolling: Supervision Sidelying to sit: Supervision       General bed mobility comments: S and extended time/increased effort, use of rails  Transfers Overall transfer level: Needs  assistance Equipment used: Rolling walker (2 wheeled) Transfers: Sit to/from Stand Sit to Stand: Mod assist         General transfer comment: ModAx1 to boost to full standing position and gait balance with RW today, second person stabilizing RW  Ambulation/Gait Ambulation/Gait assistance: Min guard Gait Distance (Feet): 12 Feet Assistive device: Rolling walker (2 wheeled) Gait Pattern/deviations: Step-to pattern;Decreased step length - right;Decreased step length - left;Trunk flexed;Decreased stance time - right;Decreased stance time - left;Decreased stride length Gait velocity: decreased   General Gait Details: foot pain better with shoes, able to progrses gait distance but limited by fatigue, HR up to 117BPM SpO2 WNL on room air   Stairs             Wheelchair Mobility    Modified Rankin (Stroke Patients Only)       Balance Overall balance assessment: Needs assistance Sitting-balance support: Bilateral upper extremity supported;Feet supported Sitting balance-Leahy Scale: Good Sitting balance - Comments: S for safety     Standing balance-Leahy Scale: Fair Standing balance comment: bil UE reliance on RW, external support                            Cognition Arousal/Alertness: Awake/alert Behavior During Therapy: WFL for tasks assessed/performed;Anxious Overall Cognitive Status: Within Functional Limits for tasks assessed                                 General Comments: very motivated, pleasant and cooperative      Exercises  General Comments        Pertinent Vitals/Pain Pain Assessment: No/denies pain Pain Score: 0-No pain Pain Intervention(s): Limited activity within patient's tolerance;Monitored during session    Home Living                      Prior Function            PT Goals (current goals can now be found in the care plan section) Acute Rehab PT Goals Patient Stated Goal: to get OOB PT Goal  Formulation: With patient Time For Goal Achievement: 01/31/19 Potential to Achieve Goals: Good Progress towards PT goals: Progressing toward goals    Frequency    Min 3X/week      PT Plan Current plan remains appropriate    Co-evaluation              AM-PAC PT "6 Clicks" Mobility   Outcome Measure  Help needed turning from your back to your side while in a flat bed without using bedrails?: A Little Help needed moving from lying on your back to sitting on the side of a flat bed without using bedrails?: A Little Help needed moving to and from a bed to a chair (including a wheelchair)?: A Little Help needed standing up from a chair using your arms (e.g., wheelchair or bedside chair)?: A Lot Help needed to walk in hospital room?: A Little Help needed climbing 3-5 steps with a railing? : Total 6 Click Score: 15    End of Session Equipment Utilized During Treatment: Other (comment)(abdominal binder) Activity Tolerance: Patient tolerated treatment well Patient left: in chair;with call bell/phone within reach Nurse Communication: Mobility status PT Visit Diagnosis: Other abnormalities of gait and mobility (R26.89);Pain;Muscle weakness (generalized) (M62.81) Pain - Right/Left: Right Pain - part of body: (abdomen)     Time: 1735-6701 PT Time Calculation (min) (ACUTE ONLY): 25 min  Charges:  $Gait Training: 8-22 mins $Therapeutic Activity: 8-22 mins                     Windell Norfolk, DPT, PN1   Supplemental Physical Therapist [place]    Pager [phone] Acute Rehab Office [phone]

## 2019-01-19 NOTE — Progress Notes (Addendum)
PROGRESS NOTE  Roberto [name] HFW:[id number] DOB: [birth year] DOA: 12/19/2018 PCP: Denita Lung, MD  Brief Narrative:   Pt. With history of NICM, EF 25-30%, PSVT, PAF, chronic systolic HF with AICD, hx of DVT/PE/LV thrombus , on chronic anticoagulation, was admitted 12/20/2018 for acute diverticulitis. He was initially treated conservatively, but had no real improvement and was taken to the OR 12/28/2018. Pt had Left colonic stricture of unknown etiology, and perforated cecum. Per surgery's note there was a mass in the mid descending colon(pathology negative for malignancy). The right colon was ischemic and had perforated. There was visualization of leakage of a large amount of stool throughout his entire abdominal cavity. In OR patient became hypotensive, required pressors (Phenylephrine and Levo). Surgery felt the patient was not ready for extubation, and considering co morbidities and possible septic insult asked PCCM to admit to ICU manage care. Transferred to [facility] service on 01/03/2019 now off pressors.     HPI/Recap of past 24 hours: Tolerating regular diet,  Colostomy with liquid stool output Drain output 25cc over the last 24hr, he denies pain, no fever, he is on room air motivated and eager to participate in PT, he ambulated 12 feet with a walker this morning  Assessment/Plan: Principal Problem:   Acute diverticulitis Active Problems:   Obesity, morbid ([place])   Nonischemic cardiomyopathy (HCC)   Paroxysmal SVT (supraventricular tachycardia) (HCC)   Chronic systolic heart failure (HCC)   Chronic pain of right knee   History of DVT (deep vein thrombosis)   PAF (paroxysmal atrial fibrillation) (HCC)   Pre-operative cardiovascular examination   Bowel obstruction (HCC)   Respiratory failure ([place])   Acute on chronic systolic heart failure (HCC)   Left colonic stricture of unknown etiology; path negative for malignancy, s/p STC/ileostomyon 12/28/2018,Dr. Rolm Bookbinder -he required TPN initially, now he is tolerating regular diet, he is cleared to discharge from surgical stand point and close follow up with general surgery  perforated R colon with right lateral peritoneal fluid collection s/p right lateral abdominal drain placement in IR 01/04/2019 by IR Dr. Kathlene Cote.  - cxsE coli pansensitive,abx (zosyn and diflucan) stopped 12/27.Repeat CT 1/4 w/ decrease in size of fluid collection w/ catheter in favorable position. IR rec leaving drain in place at discharge and follow up with IR in 1-2 qweeks   Septic shock secondary to perforated cecum and contamination of abdominal cavity with stool s/p subtotal colectomy and ileostomy- Was onphenylephrine, levophed + vasopressin initially,  Off pressor on 12/20 sepsis resolved/recovered well   Acute hypoxic respiratory failure possibly secondary to postop atelectasis Intubated on 12/15, extubated on 12/16 Normal oxygen supplementation at baseline on RA now; stable  5pm addendum, patient complained of dry cough, will get chest x-ray, Mucinex for now  Chronic combined systolic and diastolic CHF Last 2D echo LVEF 25 to 30%, Status post AICD -She is off Entresto due to sepsis and hypotension,  -I have messaged cardiology Dr. Meda Coffee regarding timing to  resume Entresto  Paroxysmal A. fib History of DVT/PE/LV thrombus Currently in sinus rhythm , continue digoxin, amiodarone, Eliquis Cardiology signed off, follow up appointment in AVS Stable, will discontinue telemetry  Class III obesity: Body mass index is 47.1 kg/m. Recommend lifestyle modification, weight loss management  DVT Prophylaxis: Eliquis  Code Status: Full  Family Communication: patient   Disposition Plan: CIR declined , awaiting for snf placement, will need repeat Covid testing when getting closer to discharge   Consultants:  Critical care, signed off  Cardiology, s/o 01/16/2018, pt has f/u appt   General  surgery  Interventional radiology  Procedures:   12/23/18: Flexible Sigmoidoscopy  12/28/18:Exploratory laparotomy,Subtotal colectomy, End ileostomy forLeft colonic stricture of unknown etiology, perforated cecum  Intubated for surgery on December 15, activated on December 16  01/04/19:CT Guided Drainage ofPeritoneal Abscess by IR  Antibiotics:  As described from above   Objective: BP 95/63 (BP Location: Left Arm)   Pulse 73   Temp (!) 97.5 F (36.4 C) (Oral)   Resp 18   Ht 5\' 7"  (1.702 m)   Wt (!) 136.4 kg   SpO2 96%   BMI 47.10 kg/m   Intake/Output Summary (Last 24 hours) at 01/19/2019 1224 Last data filed at 01/19/2019 1213 Gross per 24 hour  Intake 462 ml  Output 2150 ml  Net -1688 ml   Filed Weights   01/17/19 0446 01/18/19 0446 01/19/19 0427  Weight: (!) 137.5 kg 135.6 kg (!) 136.4 kg    Exam: Patient is examined daily including today on 01/19/2019, exams remain the same as of yesterday except that has changed    General:  NAD  Cardiovascular: RRR  Respiratory: CTABL  Abdomen: + right lower quadrant ileostomy with liquid stool output , +right side abdominal drain in place , +abdominal binder , positive BS  Musculoskeletal: No Edema  Neuro: alert, oriented   Data Reviewed: Basic Metabolic Panel: Recent Labs  Lab 01/13/19 0500 01/14/19 0558 01/16/19 1314 01/17/19 0616 01/18/19 0740  NA 134* 136 135 135 138  K 4.7 4.4 3.9 3.8 4.0  CL 101 102 100 97* 100  CO2 25 25 25 26 26   GLUCOSE 140* 112* 148* 109* 113*  BUN 19 20 19 17 17   CREATININE 1.09 1.02 1.19 1.07 1.15  CALCIUM 8.0* 8.1* 7.9* 8.2* 8.3*  MG 1.8  --   --  1.7  --   PHOS 4.2  --   --  3.7  --    Liver Function Tests: Recent Labs  Lab 01/13/19 0500 01/17/19 0616  AST 23 18  ALT 18 17  ALKPHOS 65 59  BILITOT 0.5 0.3  PROT 6.3* 6.9  ALBUMIN 1.4* 1.5*   No results for input(s): LIPASE, AMYLASE in the last 168 hours. No results for input(s): AMMONIA in the last 168  hours. CBC: Recent Labs  Lab 01/15/19 0415 01/16/19 0615 01/17/19 0616 01/18/19 0740 01/19/19 0426  WBC 10.5 10.0 10.4 10.5 11.4*  NEUTROABS  --   --  7.0  --   --   HGB 9.6* 9.5* 9.7* 10.0* 10.3*  HCT 29.8* 29.9* 30.5* 32.1* 33.3*  MCV 89.0 89.0 88.7 89.9 89.8  PLT 327 317 326 338 339   Cardiac Enzymes:   No results for input(s): CKTOTAL, CKMB, CKMBINDEX, TROPONINI in the last 168 hours. BNP (last 3 results) No results for input(s): BNP in the last 8760 hours.  ProBNP (last 3 results) No results for input(s): PROBNP in the last 8760 hours.  CBG: Recent Labs  Lab 01/18/19 1117 01/18/19 1636 01/18/19 2109 01/19/19 0751 01/19/19 1133  GLUCAP 178* 120* 146* 109* 136*    No results found for this or any previous visit (from the past 240 hour(s)).   Studies: No results found.  Scheduled Meds: . acetaminophen  650 mg Oral Q6H  . amiodarone  200 mg Oral Daily  . apixaban  5 mg Oral BID  . chlorhexidine  15 mL Mouth Rinse BID  . Chlorhexidine Gluconate Cloth  6 each Topical Daily  .  digoxin  0.125 mg Oral Daily  . feeding supplement (ENSURE ENLIVE)  237 mL Oral TID BM  . feeding supplement (PRO-STAT SUGAR FREE 64)  30 mL Oral TID BM  . insulin aspart  0-20 Units Subcutaneous TID WC  . insulin aspart  0-5 Units Subcutaneous QHS  . mouth rinse  15 mL Mouth Rinse q12n4p  . methocarbamol  500 mg Oral TID  . multivitamin with minerals  1 tablet Oral Daily  . pantoprazole  40 mg Oral Q1200  . sodium chloride flush  10-40 mL Intracatheter Q12H  . sodium chloride flush  5 mL Intracatheter Q8H  . torsemide  20 mg Oral Daily    Continuous Infusions: . sodium chloride 10 mL/hr at 01/09/19 1800  . sodium chloride       Time spent: I have personally reviewed and interpreted on  01/19/2019 daily labs, tele strips, imagings as discussed above under date review session and assessment and plans.  I reviewed all nursing notes, pharmacy notes, consultant notes,   vitals, pertinent old records  I have discussed plan of care as described above with RN , patient on 01/19/2019   Albertine Grates MD, PhD, FACP  [facility]  [email or web address], password [facility] 01/19/2019, 12:24 PM  LOS: 30 days

## 2019-01-19 NOTE — Plan of Care (Signed)

## 2019-01-19 NOTE — TOC Progression Note (Signed)
Transition of Care [facility]) - Progression Note    Patient Details  Name: Roberto [name] MRN: [id number] Date of Birth: October 28, 1965  Transition of Care [facility]) CM/SW Contact  Eduard Roux, [place] Phone Number: 01/19/2019, 4:31 PM  Clinical Narrative:     Patient has no bed offers.  CSW will continue to follow and assist with discharge planning.  Antony Blackbird, MSW, LCSWA Clinical Social Worker   Expected Discharge Plan: IP Rehab Facility Barriers to Discharge: Continued Medical Work up, Conservator, museum/gallery and Services Expected Discharge Plan: IP Rehab Facility In-house Referral: Clinical Social Work Discharge Planning Services: [employer] Consult Post Acute Care Choice: IP Rehab Living arrangements for the past 2 months: Apartment                                       Social Determinants of Health (SDOH) Interventions    Readmission Risk Interventions Readmission Risk Prevention Plan 01/04/2019  Transportation Screening Complete  PCP or Specialist Appt within 3-5 Days Not Complete  Not Complete comments pt not stable for dc at this time  HRI or Home Care Consult Complete  Social Work Consult for Recovery Care Planning/Counseling Complete  Palliative Care Screening Not Applicable  Medication Review Oceanographer) Referral to Pharmacy  Some recent data might be hidden

## 2019-01-19 NOTE — Plan of Care (Signed)

## 2019-01-19 NOTE — Progress Notes (Signed)
Nutrition Follow-up  DOCUMENTATION CODES:   Morbid obesity  INTERVENTION:   -Ensure Enlive po BID, each supplement provides 350 kcal and 20 grams of protein -d/c Prostat  NUTRITION DIAGNOSIS:   Inadequate oral intake related to inability to eat as evidenced by NPO status.  Now on heart healthy/CHO modified diet.  GOAL:   Patient will meet greater than or equal to 90% of their needs  Progressing.  MONITOR:   PO intake, Supplement acceptance, Labs, Weight trends, I & O's(TPN)  ASSESSMENT:   54 y/o gentleman with a history of chronic HFrEF 2/2 NICM, chronic AC for history of PE and DVT, paoxysmal Afib, and history of LV thrombus (Eliquis PTA) who had a distal bowel obstruction and perforation due to a colonic stricture who underwent ex-lap with a subtotal colectomy and diverting ileostomy. He required vasopressors intraoperatively and was brought to the ICU for continued vent management and septic shock due to intrabdominal sepsis.   Procedure (12/15): Exploratory laparotomy Subtotal colectomy End ileostomy 12/16 extubated; TPN started; plan to continue NG until 12/18 then can possibly adv diet 12/17 extubated 12/18 NGT d/c 12/22 CT guided drainage of peritoneal abscess  **RD working remotely**  Patient has been consuming 100% of meals.  Per MD note, pt awaiting SNF placement at this time. Pt was cleared by surgery for discharge. Pt is drinking Ensure supplements but refusing Prostat at this time. Will d/c Prostat.  Admission weight: 333 lbs. Current weight: 300 lbs.  I/Os: -19.8L since 12/23 UOP: 1L x 24 hrs JP drain: 25 ml  Medications: Multivitamin with minerals daily  Labs reviewed: CBGs: 93-136  Diet Order:   Diet Order            Diet heart healthy/carb modified Room service appropriate? Yes; Fluid consistency: Thin  Diet effective now              EDUCATION NEEDS:   Not appropriate for education at this time  Skin:  Skin Assessment: Skin  Integrity Issues: Skin Integrity Issues:: Incisions Incisions: abdomen  Last BM:  1/6 100 ml via ileostomy  Height:   Ht Readings from Last 1 Encounters:  12/31/18 5\' 7"  (1.702 m)    Weight:   Wt Readings from Last 1 Encounters:  01/19/19 (!) 136.4 kg    Ideal Body Weight:  67.27 kg  BMI:  Body mass index is 47.1 kg/m.  Estimated Nutritional Needs:   Kcal:  2200-2400  Protein:  130-150 grams  Fluid:  >/= 1.8 L/day  03/19/19, MS, RD, LDN Inpatient Clinical Dietitian Pager: [phone] After Hours Pager: [phone]

## 2019-01-20 LAB — COMPREHENSIVE METABOLIC PANEL
ALT: 20 U/L (ref 0–44)
AST: 22 U/L (ref 15–41)
Albumin: 1.8 g/dL — ABNORMAL LOW (ref 3.5–5.0)
Alkaline Phosphatase: 58 U/L (ref 38–126)
Anion gap: 13 (ref 5–15)
BUN: 19 mg/dL (ref 6–20)
CO2: 27 mmol/L (ref 22–32)
Calcium: 8.4 mg/dL — ABNORMAL LOW (ref 8.9–10.3)
Chloride: 97 mmol/L — ABNORMAL LOW (ref 98–111)
Creatinine, Ser: 1.15 mg/dL (ref 0.61–1.24)
GFR calc Af Amer: >60 mL/min (ref >60)
GFR calc non Af Amer: >60 mL/min (ref >60)
Glucose, Bld: 96 mg/dL (ref 70–99)
Potassium: 3.8 mmol/L (ref 3.5–5.1)
Sodium: 137 mmol/L (ref 135–145)
Total Bilirubin: 0.5 mg/dL (ref 0.3–1.2)
Total Protein: 7.3 g/dL (ref 6.5–8.1)

## 2019-01-20 LAB — CBC
HCT: 33.8 % — ABNORMAL LOW (ref 39.0–52.0)
Hemoglobin: 10.5 g/dL — ABNORMAL LOW (ref 13.0–17.0)
MCH: 27.6 pg (ref 26.0–34.0)
MCHC: 31.1 g/dL (ref 30.0–36.0)
MCV: 88.9 fL (ref 80.0–100.0)
Platelets: 311 10*3/uL (ref 150–400)
RBC: 3.8 MIL/uL — ABNORMAL LOW (ref 4.22–5.81)
RDW: 14.4 % (ref 11.5–15.5)
WBC: 10.7 10*3/uL — ABNORMAL HIGH (ref 4.0–10.5)
nRBC: 0 % (ref 0.0–0.2)

## 2019-01-20 LAB — GLUCOSE, CAPILLARY
Glucose-Capillary: 101 mg/dL — ABNORMAL HIGH (ref 70–99)
Glucose-Capillary: 147 mg/dL — ABNORMAL HIGH (ref 70–99)
Glucose-Capillary: 108 mg/dL — ABNORMAL HIGH (ref 70–99)
Glucose-Capillary: 149 mg/dL — ABNORMAL HIGH (ref 70–99)

## 2019-01-20 LAB — MAGNESIUM: Magnesium: 1.7 mg/dL (ref 1.7–2.4)

## 2019-01-20 LAB — PHOSPHORUS: Phosphorus: 4.2 mg/dL (ref 2.5–4.6)

## 2019-01-20 NOTE — Progress Notes (Signed)
PROGRESS NOTE  Roberto [name] SEG:[id number] DOB: [birth year] DOA: 12/19/2018 PCP: [name], MD  Brief Narrative:   Pt. With history of NICM, EF 25-30%, PSVT, PAF, chronic systolic HF with AICD, hx of DVT/PE/LV thrombus , on chronic anticoagulation, was admitted 12/20/2018 for acute diverticulitis. He was initially treated conservatively, but had no real improvement and was taken to the OR 12/28/2018. Pt had Left colonic stricture of unknown etiology, and perforated cecum. Per surgery's note there was a mass in the mid descending colon(pathology negative for malignancy). The right colon was ischemic and had perforated. There was visualization of leakage of a large amount of stool throughout his entire abdominal cavity. In OR patient became hypotensive, required pressors (Phenylephrine and Levo). Surgery felt the patient was not ready for extubation, and considering co morbidities and possible septic insult asked PCCM to admit to ICU manage care. Transferred to [facility] service on 01/03/2019 now off pressors.     HPI/Recap of past 24 hours:  Denies sob, some mild dry cough, denies sob, denies chest pain, he is on room air Remain very weak, needs help getting out of bed, he is motivated to participate with PT Tolerating regular diet,  Colostomy with liquid stool output Drain output  Is not documented  the last 24hr,  he denies pain, no fever,    Assessment/Plan: Principal Problem:   Acute diverticulitis Active Problems:   Obesity, morbid (HCC)   Nonischemic cardiomyopathy (HCC)   Paroxysmal SVT (supraventricular tachycardia) (HCC)   Chronic systolic heart failure (HCC)   Chronic pain of right knee   History of DVT (deep vein thrombosis)   PAF (paroxysmal atrial fibrillation) (HCC)   Pre-operative cardiovascular examination   Bowel obstruction (HCC)   Respiratory failure (HCC)   Acute on chronic systolic heart failure (HCC)   Left colonic stricture of unknown etiology;  path negative for malignancy, s/p STC/ileostomyon 12/28/2018,Dr. Emelia Loron -he required TPN initially, now he is tolerating regular diet, he is cleared to discharge from surgical stand point and close follow up with general surgery, currently he is awaiting for snf and general surgery continue to follow him while he is here.  perforated R colon with right lateral peritoneal fluid collection s/p right lateral abdominal drain placement in IR 01/04/2019 by IR Dr. Fredia Sorrow.  - cxsE coli pansensitive,abx (zosyn and diflucan) stopped 12/27.Repeat CT 1/4 w/ decrease in size of fluid collection w/ catheter in favorable position. IR rec leaving drain in place at discharge ,with daily flushes of 5-10 mL sterile saline. Continue to record output daily.   - follow up with IR in 1-2 qweeks   Septic shock secondary to perforated cecum and contamination of abdominal cavity with stool s/p subtotal colectomy and ileostomy- Was onphenylephrine, levophed + vasopressin initially,  Off pressor on 12/20 sepsis resolved/recovered well   Acute hypoxic respiratory failure possibly secondary to postop atelectasis Intubated on 12/15, extubated on 12/16 Normal oxygen supplementation at baseline on RA now; stable  patient complained of dry cough,  Repeat chest x-ray on 1/7 continue to have signs of vascular congestion, right lower lung zone opacity could represent a combination of both atelectasis and a small right-sided pleural effusion. There is no hypoxia, continue diuresis, 2liter urine output last 24hs,  Mucinex  Expect continued improvement  Chronic combined systolic and diastolic CHF Last 2D echo LVEF 25 to 30%, Status post AICD -he is currently on demadex  20mg  daily -She is off Entresto due to sepsis and hypotension, -case discussed with  cardiology Dr. Delton See  Who recommend continue holding entresto for now, Dr Clarise Cruz to decide on when to resume entresto at hospital follow up appointment  -he  has an appointment with cardiology Dr Clarise Cruz on 2/4  Paroxysmal A. fib History of DVT/PE/LV thrombus Currently in sinus rhythm , continue digoxin, amiodarone, Eliquis Cardiology signed off, follow up appointment in AVS (2/4) Stable, ordered to d/c telemetry on 1/6.  Class III obesity: Body mass index is 46.13 kg/m. Recommend lifestyle modification, weight loss management  9 mm RUL nodule was incidentally found on his chest CT in 03/2017. He needs a repeat CT in 6-12 months, f/u with pcp to repeat ct chest, patient is aware   Liver lesions: Incidental findings on CT: Stable appearance of a 3.8 cm hypoattenuating lesion along the posterior right lobe liver additional subcentimeter hypoattenuating foci within the liver are also remains stable . F/u with pcp .  DVT Prophylaxis: Eliquis  Code Status: Full  Family Communication: patient   Disposition Plan: CIR declined , awaiting for snf placement, will need repeat Covid testing when getting closer to discharge   Consultants:  Critical care, signed off  Cardiology, s/o 01/16/2018, pt has f/u appt   General surgery  Interventional radiology  Procedures:   12/23/18: Flexible Sigmoidoscopy  12/28/18:Exploratory laparotomy,Subtotal colectomy, End ileostomy forLeft colonic stricture of unknown etiology, perforated cecum  Intubated for surgery on December 15, activated on December 16  Right arm picc line placement on 12/15   01/04/19:CT Guided Drainage ofPeritoneal Abscess by IR  Antibiotics:  As described from above   Objective: BP 108/71 (BP Location: Left Arm)   Pulse 76   Temp 98.3 F (36.8 C) (Oral)   Resp 17   Ht 5\' 7"  (1.702 m)   Wt 133.6 kg   SpO2 97%   BMI 46.13 kg/m   Intake/Output Summary (Last 24 hours) at 01/20/2019 0836 Last data filed at 01/20/2019 0500 Gross per 24 hour  Intake 600 ml  Output 2750 ml  Net -2150 ml   Filed Weights   01/18/19 0446 01/19/19 0427 01/20/19 0652    Weight: 135.6 kg (!) 136.4 kg 133.6 kg    Exam: Patient is examined daily including today on 01/20/2019, exams remain the same as of yesterday except that has changed    General:  NAD  Cardiovascular: RRR  Respiratory: diminished at basis, no rales, no rhonchi, no wheezing  Abdomen: + right lower quadrant ileostomy with liquid stool output , +right side abdominal drain in place , +abdominal binder , positive BS  Musculoskeletal: No Edema  Neuro: alert, oriented   Data Reviewed: Basic Metabolic Panel: Recent Labs  Lab 01/14/19 0558 01/16/19 1314 01/17/19 0616 01/18/19 0740 01/20/19 0449  NA 136 135 135 138 137  K 4.4 3.9 3.8 4.0 3.8  CL 102 100 97* 100 97*  CO2 25 25 26 26 27   GLUCOSE 112* 148* 109* 113* 96  BUN 20 19 17 17 19   CREATININE 1.02 1.19 1.07 1.15 1.15  CALCIUM 8.1* 7.9* 8.2* 8.3* 8.4*  MG  --   --  1.7  --  1.7  PHOS  --   --  3.7  --  4.2   Liver Function Tests: Recent Labs  Lab 01/17/19 0616 01/20/19 0449  AST 18 22  ALT 17 20  ALKPHOS 59 58  BILITOT 0.3 0.5  PROT 6.9 7.3  ALBUMIN 1.5* 1.8*   No results for input(s): LIPASE, AMYLASE in the last 168 hours. No results for  input(s): AMMONIA in the last 168 hours. CBC: Recent Labs  Lab 01/16/19 0615 01/17/19 0616 01/18/19 0740 01/19/19 0426 01/20/19 0449  WBC 10.0 10.4 10.5 11.4* 10.7*  NEUTROABS  --  7.0  --   --   --   HGB 9.5* 9.7* 10.0* 10.3* 10.5*  HCT 29.9* 30.5* 32.1* 33.3* 33.8*  MCV 89.0 88.7 89.9 89.8 88.9  PLT 317 326 338 339 311   Cardiac Enzymes:   No results for input(s): CKTOTAL, CKMB, CKMBINDEX, TROPONINI in the last 168 hours. BNP (last 3 results) No results for input(s): BNP in the last 8760 hours.  ProBNP (last 3 results) No results for input(s): PROBNP in the last 8760 hours.  CBG: Recent Labs  Lab 01/19/19 0751 01/19/19 1133 01/19/19 1637 01/19/19 2157 01/20/19 0808  GLUCAP 109* 136* 93 159* 101*    No results found for this or any previous visit  (from the past 240 hour(s)).   Studies: DG CHEST PORT 1 VIEW  Result Date: 01/19/2019 CLINICAL DATA:  Cough. EXAM: PORTABLE CHEST 1 VIEW COMPARISON:  12/28/2018 FINDINGS: The patient has been extubated. There is a right-sided PICC line is well position. There is a left-sided AICD that is well position. The heart size is enlarged. There are bilateral pleural effusions, right greater than left. Bibasilar atelectasis is noted. There is mild vascular congestion without overt pulmonary edema. IMPRESSION: 1. Well-positioned right-sided PICC line. 2. Cardiomegaly with vascular congestion. 3. Right lower lung zone opacity favored to represent a combination of both atelectasis and a small right-sided pleural effusion. Electronically Signed   By: Constance Holster M.D.   On: 01/19/2019 18:58    Scheduled Meds: . acetaminophen  650 mg Oral Q6H  . amiodarone  200 mg Oral Daily  . apixaban  5 mg Oral BID  . chlorhexidine  15 mL Mouth Rinse BID  . Chlorhexidine Gluconate Cloth  6 each Topical Daily  . digoxin  0.125 mg Oral Daily  . feeding supplement (ENSURE ENLIVE)  237 mL Oral TID BM  . guaiFENesin  600 mg Oral BID  . insulin aspart  0-20 Units Subcutaneous TID WC  . insulin aspart  0-5 Units Subcutaneous QHS  . mouth rinse  15 mL Mouth Rinse q12n4p  . methocarbamol  500 mg Oral TID  . multivitamin with minerals  1 tablet Oral Daily  . pantoprazole  40 mg Oral Q1200  . sodium chloride flush  10-40 mL Intracatheter Q12H  . sodium chloride flush  5 mL Intracatheter Q8H  . torsemide  20 mg Oral Daily    Continuous Infusions: . sodium chloride 10 mL/hr at 01/09/19 1800  . sodium chloride       Time spent: 45mins I have personally reviewed and interpreted on  01/20/2019 daily labs, tele strips, imagings as discussed above under date review session and assessment and plans.  I reviewed all nursing notes, pharmacy notes, consultant notes,  vitals, pertinent old records  I have discussed plan of  care as described above with RN , patient on 01/20/2019   Florencia Reasons MD, PhD, FACP  [facility]  [email or web address], password [email or web address] 01/20/2019, 8:36 AM  LOS: 31 days

## 2019-01-20 NOTE — Consult Note (Signed)
WOC Nurse ostomy follow up Patient receiving care in [facility] 6E26 Stoma type/location: RLQ ileostomy  Pouch intact, no signs of impending leakage. Ample supply of pouching products in room. Will touch base weekly, more often if needed. Waiting for SNF placement.  Helmut Muster, RN, MSN, CWOCN, CNS-BC, pager [phone]

## 2019-01-20 NOTE — Progress Notes (Signed)
Occupational Therapy Treatment Patient Details Name: Roberto [name] MRN: [id number] DOB: 1966/01/06 Today's Date: 01/20/2019    History of present illness Pt is a 54 y.o. M with history of NICM, EF 25-30%, PSVT, PAF, chronic systolic HF with AICD, DVT/PE/LV thrombus , on chronic anticoagulation who was admitted 12/20/2018 for acute diverticulitis. He was initially treated conservatively, but had no real improvement and was taken to the OR 12/28/2018.Pt was found to have a perforated cecum with leakage of stool throughout abdominal cavity. 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 He was brought from the OR to NICU intubated and on pressors. Extubated 12/16.   OT comments  Patient agreeable to OT, motivated to get out of bed. Patient supervision with HOB elevated for bed mobility, total A at edge of bed for lower body dressing. From elevated surface pt min A to stand and mod cues for body mechanics not to pull on walker to stand. Patient requires min A to take few steps from bed to recliner with mod cues for sequencing backing up to chair and for body mechanics to reach back for chair arm. Patient would continue to benefit from skilled OT for compensatory strategies, education of AE and ADL retraining.    Follow Up Recommendations  SNF;Supervision/Assistance - 24 hour    Equipment Recommendations  3 in 1 bedside commode;Tub/shower seat       Precautions / Restrictions Precautions Precautions: Fall;Other (comment) Precaution Comments: colostomy, JP drain R , needs abdominal binder Restrictions Weight Bearing Restrictions: No       Mobility Bed Mobility Overal bed mobility: Needs Assistance Bed Mobility: Rolling;Sidelying to Sit Rolling: Supervision Sidelying to sit: Supervision;HOB elevated       General bed mobility comments: S and extended time/increased effort, use of rails  Transfers Overall transfer level: Needs assistance Equipment used: Rolling walker (2  wheeled) Transfers: Sit to/from Stand Sit to Stand: Min assist;From elevated surface         General transfer comment: min A to boost to standing from elevated surface, mod cues for body mechanics    Balance Overall balance assessment: Needs assistance Sitting-balance support: No upper extremity supported;Feet supported Sitting balance-Leahy Scale: Good Sitting balance - Comments: S for safety   Standing balance support: Bilateral upper extremity supported;During functional activity Standing balance-Leahy Scale: Poor Standing balance comment: bil UE reliance on RW, external support                           ADL either performed or assessed with clinical judgement   ADL Overall ADL's : Needs assistance/impaired                     Lower Body Dressing: Total assistance Lower Body Dressing Details (indicate cue type and reason): total A don socks and shoes Toilet Transfer: Minimal assistance;RW;Ambulation;BSC Toilet Transfer Details (indicate cue type and reason): simulated with transfer to recliner, elevated surface, min A for stability and cues for body mechanics         Functional mobility during ADLs: Minimal assistance;Rolling walker;Cueing for sequencing;Cueing for safety                 Cognition Arousal/Alertness: Awake/alert Behavior During Therapy: [facility] for tasks assessed/performed;Anxious Overall Cognitive Status: Within Functional Limits for tasks assessed  General Comments: very motivated, pleasant and cooperative                   Pertinent Vitals/ Pain       Pain Assessment: Faces Faces Pain Scale: Hurts a little bit Pain Location: abdominal pain Pain Descriptors / Indicators: Aching;Throbbing;Sore Pain Intervention(s): Limited activity within patient's tolerance;Monitored during session         Frequency  Min 2X/week        Progress Toward Goals  OT Goals(current goals  can now be found in the care plan section)  Progress towards OT goals: Progressing toward goals  Acute Rehab OT Goals Patient Stated Goal: to get OOB OT Goal Formulation: With patient Time For Goal Achievement: 02/03/19 Potential to Achieve Goals: Good ADL Goals Pt Will Perform Grooming: with supervision;standing Pt Will Perform Upper Body Dressing: with set-up;sitting Pt Will Perform Lower Body Dressing: sitting/lateral leans;sit to/from stand;with min assist;with adaptive equipment Pt Will Transfer to Toilet: ambulating;bedside commode;with supervision Pt Will Perform Toileting - Clothing Manipulation and hygiene: with mod assist;sitting/lateral leans;sit to/from stand Pt/caregiver will Perform Home Exercise Program: Increased strength;Both right and left upper extremity;With Supervision  Plan Discharge plan needs to be updated       AM-PAC OT "6 Clicks" Daily Activity     Outcome Measure   Help from another person eating meals?: None Help from another person taking care of personal grooming?: A Little Help from another person toileting, which includes using toliet, bedpan, or urinal?: A Lot Help from another person bathing (including washing, rinsing, drying)?: A Lot Help from another person to put on and taking off regular upper body clothing?: A Little Help from another person to put on and taking off regular lower body clothing?: Total 6 Click Score: 15    End of Session Equipment Utilized During Treatment: Rolling walker  OT Visit Diagnosis: Unsteadiness on feet (R26.81);Muscle weakness (generalized) (M62.81);Pain Pain - part of body: (abdomen)   Activity Tolerance Patient tolerated treatment well   Patient Left in chair;with call bell/phone within reach   Nurse Communication Mobility status        Time: 0092-3300 OT Time Calculation (min): 15 min  Charges: OT General Charges $OT Visit: 1 Visit OT Treatments $Self Care/Home Management : 8-22 mins  Myrtie Neither  OT OT office: [phone]   Carmelia Roller 01/20/2019, 3:22 PM

## 2019-01-20 NOTE — Progress Notes (Signed)
23 Days Post-Op  Subjective: CC: Doing well. Tolerating a diet without n/v. No abdominal pain. Having bowel function. Looking forward to working with therapies today.   Objective: Vital signs in last 24 hours: Temp:  [98.1 F (36.7 C)-98.5 F (36.9 C)] 98.3 F (36.8 C) (01/07 0652) Pulse Rate:  [76-78] 76 (01/07 0652) Resp:  [17-25] 17 (01/07 0652) BP: (105-108)/(52-71) 108/71 (01/07 0652) SpO2:  [96 %-98 %] 97 % (01/07 0652) Weight:  [133.6 kg] 133.6 kg (01/07 0652) Last BM Date: 01/19/19  Intake/Output from previous day: 01/06 0701 - 01/07 0700 In: 600 [P.O.:600] Out: 2750 [Urine:2050; Stool:700] Intake/Output this shift: No intake/output data recorded.  PE: Gen: Alert, NAD, pleasant Pulm:Normal rate and effort Abd: Soft, NT/ND, +BS,drain site LLQ c/d/i. RLQ drain with thick tan fluid. Midline wound dehisced without evisceration. Healthy granulation tissue at base and on sides. Adaptic at base. Skin: no rashes noted, warm and dry  Lab Results:  Recent Labs    01/19/19 0426 01/20/19 0449  WBC 11.4* 10.7*  HGB 10.3* 10.5*  HCT 33.3* 33.8*  PLT 339 311   BMET Recent Labs    01/18/19 0740 01/20/19 0449  NA 138 137  K 4.0 3.8  CL 100 97*  CO2 26 27  GLUCOSE 113* 96  BUN 17 19  CREATININE 1.15 1.15  CALCIUM 8.3* 8.4*   PT/INR No results for input(s): LABPROT, INR in the last 72 hours. CMP     Component Value Date/Time   NA 137 01/20/2019 0449   NA 140 08/26/2018 1355   K 3.8 01/20/2019 0449   CL 97 (L) 01/20/2019 0449   CO2 27 01/20/2019 0449   GLUCOSE 96 01/20/2019 0449   BUN 19 01/20/2019 0449   BUN 14 08/26/2018 1355   CREATININE 1.15 01/20/2019 0449   CREATININE 1.20 10/29/2016 1236   CALCIUM 8.4 (L) 01/20/2019 0449   PROT 7.3 01/20/2019 0449   PROT 7.4 12/03/2018 1459   ALBUMIN 1.8 (L) 01/20/2019 0449   ALBUMIN 3.6 (L) 12/03/2018 1459   AST 22 01/20/2019 0449   ALT 20 01/20/2019 0449   ALKPHOS 58 01/20/2019 0449   BILITOT 0.5  01/20/2019 0449   BILITOT 0.3 12/03/2018 1459   GFRNONAA >60 01/20/2019 0449   GFRAA >60 01/20/2019 0449   Lipase     Component Value Date/Time   LIPASE 27 12/19/2018 2038       Studies/Results: DG CHEST PORT 1 VIEW  Result Date: 01/19/2019 CLINICAL DATA:  Cough. EXAM: PORTABLE CHEST 1 VIEW COMPARISON:  12/28/2018 FINDINGS: The patient has been extubated. There is a right-sided PICC line is well position. There is a left-sided AICD that is well position. The heart size is enlarged. There are bilateral pleural effusions, right greater than left. Bibasilar atelectasis is noted. There is mild vascular congestion without overt pulmonary edema. IMPRESSION: 1. Well-positioned right-sided PICC line. 2. Cardiomegaly with vascular congestion. 3. Right lower lung zone opacity favored to represent a combination of both atelectasis and a small right-sided pleural effusion. Electronically Signed   By: Katherine Mantle M.D.   On: 01/19/2019 18:58    Anti-infectives: Anti-infectives (From admission, onward)   Start     Dose/Rate Route Frequency Ordered Stop   12/28/18 1400  fluconazole (DIFLUCAN) IVPB 400 mg     400 mg 100 mL/hr over 120 Minutes Intravenous Every 24 hours 12/28/18 1318 01/09/19 2359   12/20/18 2200  piperacillin-tazobactam (ZOSYN) IVPB 3.375 g     3.375 g 12.5 mL/hr  over 240 Minutes Intravenous Every 8 hours 12/20/18 1834 01/09/19 2359   12/20/18 0645  piperacillin-tazobactam (ZOSYN) IVPB 3.375 g  Status:  Discontinued     3.375 g 100 mL/hr over 30 Minutes Intravenous Every 6 hours 12/20/18 0630 12/20/18 1833   12/19/18 2315  piperacillin-tazobactam (ZOSYN) IVPB 3.375 g     3.375 g 100 mL/hr over 30 Minutes Intravenous  Once 12/19/18 2302 12/20/18 0003       Assessment/Plan Left colonic stricture of unknown etiology; with perforated cecum POD#23STC/ileostomy fordiverticular stricture of left colon and perforated R colon12/15/2020,Dr. Rolm Bookbinder -Pathwithout  evidence of malignancy -S/p drain placement 12/22 - cxsE coli pansensitive,abx stopped 12/27.Repeat CT 1/4 w/ decrease in size of fluid collection w/ catheter in favorable position. IR rec leaving drain in place -Ileostomy functioning, tolerating a diet.Prealbumin improving. TPN off - wound dehisced, monitor. BID wet to dry dressing changes with adaptic in the base - abdominal binder - oob, therapies, ready for SNF from surgical standpoint when bed available - Surgical drain removed LLQ 1/4  FEN:CM diet and supplements LID:[id number]>> 12/28 ID: IV Zosyn 12/6>>12/27, IV diflucan 12/15>>12/27. Afebrile. WBC 10.7 Follow up: Dr. Donne Hazel   LOS: 31 days   Nadeen Landau, M.D. [facility] Use [email or web address] to contact on call provider

## 2019-01-20 NOTE — TOC Progression Note (Addendum)
Transition of Care [facility]) - Progression Note    Patient Details  Name: ABBIE JABLON MRN: [id number] Date of Birth: 1965/12/28  Transition of [facility]) CM/SW Contact  Doy Hutching, [place] Phone Number: 01/20/2019, 11:27 AM  Clinical Narrative:   4:10pm- CSW called pt room via room phone and provided pt with two bed offers [facility] and [facility]. Pt is tech savvy and able to review offers, encouraged him to utilize [email or web address] [email or web address] team continuing to follow   11:27am- CSW aware there are no offers at this time for SNF- will reach out to pending SNFs for bed availability.    Expected Discharge Plan: IP Rehab Facility Barriers to Discharge: Continued Medical Work up, English as a second language teacher  Expected Discharge Plan and Services Expected Discharge Plan: IP Rehab Facility In-house Referral: Clinical Social Work Discharge Planning Services: CM Consult Post Acute Care Choice: IP Rehab Living arrangements for the past 2 months: Apartment  Readmission Risk Interventions Readmission Risk Prevention Plan 01/04/2019  Transportation Screening Complete  PCP or Specialist Appt within 3-5 Days Not Complete  Not Complete comments pt not stable for dc at this time  HRI or Home Care Consult Complete  Social Work Consult for Recovery Care Planning/Counseling Complete  Palliative Care Screening Not Applicable  Medication Review Oceanographer) Referral to Pharmacy  Some recent data might be hidden

## 2019-01-21 LAB — GLUCOSE, CAPILLARY
Glucose-Capillary: 121 mg/dL — ABNORMAL HIGH (ref 70–99)
Glucose-Capillary: 118 mg/dL — ABNORMAL HIGH (ref 70–99)
Glucose-Capillary: 124 mg/dL — ABNORMAL HIGH (ref 70–99)
Glucose-Capillary: 100 mg/dL — ABNORMAL HIGH (ref 70–99)

## 2019-01-21 LAB — CBC
HCT: 33.3 % — ABNORMAL LOW (ref 39.0–52.0)
Hemoglobin: 10.4 g/dL — ABNORMAL LOW (ref 13.0–17.0)
MCH: 27.4 pg (ref 26.0–34.0)
MCHC: 31.2 g/dL (ref 30.0–36.0)
MCV: 87.9 fL (ref 80.0–100.0)
Platelets: 315 10*3/uL (ref 150–400)
RBC: 3.79 MIL/uL — ABNORMAL LOW (ref 4.22–5.81)
RDW: 14.1 % (ref 11.5–15.5)
WBC: 10.4 10*3/uL (ref 4.0–10.5)
nRBC: 0 % (ref 0.0–0.2)

## 2019-01-21 LAB — BASIC METABOLIC PANEL
Anion gap: 10 (ref 5–15)
BUN: 19 mg/dL (ref 6–20)
CO2: 27 mmol/L (ref 22–32)
Calcium: 8.3 mg/dL — ABNORMAL LOW (ref 8.9–10.3)
Chloride: 95 mmol/L — ABNORMAL LOW (ref 98–111)
Creatinine, Ser: 1.21 mg/dL (ref 0.61–1.24)
GFR calc Af Amer: >60 mL/min (ref >60)
GFR calc non Af Amer: >60 mL/min (ref >60)
Glucose, Bld: 114 mg/dL — ABNORMAL HIGH (ref 70–99)
Potassium: 3.6 mmol/L (ref 3.5–5.1)
Sodium: 132 mmol/L — ABNORMAL LOW (ref 135–145)

## 2019-01-21 LAB — MAGNESIUM: Magnesium: 1.7 mg/dL (ref 1.7–2.4)

## 2019-01-21 LAB — SARS CORONAVIRUS 2 (TAT 6-24 HRS): SARS Coronavirus 2: NEGATIVE

## 2019-01-21 MED ORDER — MAGNESIUM SULFATE 2 GM/50ML IV SOLN
2.0000 g | Freq: Once | INTRAVENOUS | Status: AC
  Administered 2019-01-21: 2 g via INTRAVENOUS
  Filled 2019-01-21: qty 50

## 2019-01-21 NOTE — Progress Notes (Signed)
Physical Therapy Treatment Patient Details Name: Roberto [name] MRN: [id number] DOB: 1965-04-03 Today's Date: 01/21/2019    History of Present Illness Pt is a 54 y.o. M with history of NICM, EF 25-30%, PSVT, PAF, chronic systolic HF with AICD, DVT/PE/LV thrombus , on chronic anticoagulation who was admitted 12/20/2018 for acute diverticulitis. He was initially treated conservatively, but had no real improvement and was taken to the OR 12/28/2018.Pt was found to have a perforated cecum with leakage of stool throughout abdominal cavity. 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 He was brought from the OR to NICU intubated and on pressors. Extubated 12/16.    PT Comments    Patient up in chair upon entry, excited and motivated to progress with therapy. Able to complete functional transfers with min guard (and PT/tech holding chair due to loose brakes), then tolerated progression of gait training approximately 49f with RW and min guard. HR up to 121BPM with gait and he did need 1-2 standing rest breaks. He was left up in the chair with all needs met this afternoon. Continues to make excellent progress with PT.     Follow Up Recommendations  SNF     Equipment Recommendations  Rolling walker with 5" wheels;3in1 (PT)    Recommendations for Other Services       Precautions / Restrictions Precautions Precautions: Fall;Other (comment) Precaution Comments: colostomy, JP drain R , needs abdominal binder, watch HR with gait Restrictions Weight Bearing Restrictions: No    Mobility  Bed Mobility               General bed mobility comments: up in chair upon entry  Transfers Overall transfer level: Needs assistance Equipment used: Rolling walker (2 wheeled) Transfers: Sit to/from Stand Sit to Stand: Min guard         General transfer comment: min guard to boost to standing from chair, did have to block chair due to it having loose brakes  Ambulation/Gait Ambulation/Gait  assistance: Min guard Gait Distance (Feet): 60 Feet Assistive device: Rolling walker (2 wheeled) Gait Pattern/deviations: Step-to pattern;Decreased step length - right;Decreased step length - left;Trunk flexed;Decreased stance time - right;Decreased stance time - left;Decreased stride length Gait velocity: decreased   General Gait Details: gait distance significantly progressed today, HR up to 121BPM but slow and steady with RW   SChief Strategy Officer   Modified Rankin (Stroke Patients Only)       Balance Overall balance assessment: Needs assistance Sitting-balance support: No upper extremity supported;Feet supported Sitting balance-Leahy Scale: Good Sitting balance - Comments: S for safety   Standing balance support: Bilateral upper extremity supported;During functional activity Standing balance-Leahy Scale: Fair Standing balance comment: BUE support on RW                            Cognition Arousal/Alertness: Awake/alert Behavior During Therapy: WFL for tasks assessed/performed Overall Cognitive Status: Within Functional Limits for tasks assessed                                 General Comments: very motivated, pleasant and cooperative      Exercises      General Comments General comments (skin integrity, edema, etc.): HR to 121BPM with gait      Pertinent Vitals/Pain Pain Assessment: No/denies pain Pain Score: 0-No pain Pain  Intervention(s): Limited activity within patient's tolerance;Monitored during session    Home Living                      Prior Function            PT Goals (current goals can now be found in the care plan section) Acute Rehab PT Goals Patient Stated Goal: to get OOB PT Goal Formulation: With patient Time For Goal Achievement: 01/31/19 Potential to Achieve Goals: Good Progress towards PT goals: Progressing toward goals    Frequency    Min 3X/week      PT Plan  Current plan remains appropriate    Co-evaluation              AM-PAC PT "6 Clicks" Mobility   Outcome Measure  Help needed turning from your back to your side while in a flat bed without using bedrails?: A Little Help needed moving from lying on your back to sitting on the side of a flat bed without using bedrails?: A Little Help needed moving to and from a bed to a chair (including a wheelchair)?: A Little Help needed standing up from a chair using your arms (e.g., wheelchair or bedside chair)?: A Little Help needed to walk in hospital room?: A Little Help needed climbing 3-5 steps with a railing? : A Lot 6 Click Score: 17    End of Session Equipment Utilized During Treatment: Other (comment)(abdominal binder) Activity Tolerance: Patient tolerated treatment well Patient left: in chair;with call bell/phone within reach Nurse Communication: Mobility status PT Visit Diagnosis: Other abnormalities of gait and mobility (R26.89);Pain;Muscle weakness (generalized) (M62.81)     Time: 1330-1350 PT Time Calculation (min) (ACUTE ONLY): 20 min  Charges:  $Gait Training: 8-22 mins                     Windell Norfolk, DPT, PN1   Supplemental Physical Therapist [place]    Pager [phone] Acute Rehab Office [phone]

## 2019-01-21 NOTE — Progress Notes (Signed)
PROGRESS NOTE  Roberto [name] BOF:[id number] DOB: [birth year] DOA: 12/19/2018 PCP: [name], MD  Brief Narrative:   Pt. With history of NICM, EF 25-30%, PSVT, PAF, chronic systolic HF with AICD, hx of DVT/PE/LV thrombus , on chronic anticoagulation, was admitted 12/20/2018 for acute diverticulitis. He was initially treated conservatively, but had no real improvement and was taken to the OR 12/28/2018. Pt had Left colonic stricture of unknown etiology, and perforated cecum. Per surgery's note there was a mass in the mid descending colon(pathology negative for malignancy). The right colon was ischemic and had perforated. There was visualization of leakage of a large amount of stool throughout his entire abdominal cavity. In OR patient became hypotensive, required pressors (Phenylephrine and Levo). Surgery felt the patient was not ready for extubation, and considering co morbidities and possible septic insult asked PCCM to admit to ICU manage care. Transferred to [facility] service on 01/03/2019 now off pressors.     HPI/Recap of past 24 hours:  fhe is feeling better and stronger , he is motivated to participate PT today Tolerating regular diet,  Colostomy with liquid stool output Drain output  Is not documented  the last 24hr,  he denies pain, no fever, no cough Awaiting for skilled nursing facility placement   Assessment/Plan: Principal Problem:   Acute diverticulitis Active Problems:   Obesity, morbid (HCC)   Nonischemic cardiomyopathy (HCC)   Paroxysmal SVT (supraventricular tachycardia) (HCC)   Chronic systolic heart failure (HCC)   Chronic pain of right knee   History of DVT (deep vein thrombosis)   PAF (paroxysmal atrial fibrillation) (HCC)   Pre-operative cardiovascular examination   Bowel obstruction (HCC)   Respiratory failure (HCC)   Acute on chronic systolic heart failure (HCC)   Left colonic stricture of unknown etiology; path negative for malignancy, s/p  STC/ileostomyon 12/28/2018,Dr. Emelia Loron -he required TPN initially, now he is tolerating regular diet, he is cleared to discharge from surgical stand point and close follow up with general surgery, currently he is awaiting for snf and general surgery continue to follow him while he is here.  perforated R colon with right lateral peritoneal fluid collection s/p right lateral abdominal drain placement in IR 01/04/2019 by IR Dr. Fredia Sorrow.  - cxsE coli pansensitive,abx (zosyn and diflucan) stopped 12/27.Repeat CT 1/4 w/ decrease in size of fluid collection w/ catheter in favorable position. IR rec leaving drain in place at discharge ,with daily flushes of 5-10 mL sterile saline. Continue to record output daily.   - follow up with IR in 1-2 qweeks   Septic shock secondary to perforated cecum and contamination of abdominal cavity with stool s/p subtotal colectomy and ileostomy- Was onphenylephrine, levophed + vasopressin initially,  Off pressor on 12/20 sepsis resolved/recovered well   Acute hypoxic respiratory failure possibly secondary to postop atelectasis Intubated on 12/15, extubated on 12/16 Normal oxygen supplementation at baseline on RA now; stable  patient complained of dry cough on 1/7,  Repeat chest x-ray on 1/7 continue to have signs of vascular congestion, right lower lung zone opacity could represent a combination of both atelectasis and a small right-sided pleural effusion. There is no hypoxia, continue diuresis, 2liter urine output last 24hs,  Mucinex  Expect continued improvement  Chronic combined systolic and diastolic CHF Last 2D echo LVEF 25 to 30%, Status post AICD -he is currently on demadex  20mg  daily -She is off Entresto due to sepsis and hypotension, -case discussed with  cardiology Dr.  Who recommend continue holding  entresto for now, Dr Clarise Cruz to decide on when to resume entresto at hospital follow up appointment  -he has an appointment with  cardiology Dr Clarise Cruz on 2/4  Paroxysmal A. fib Currently in sinus rhythm , continue digoxin, amiodarone, Eliquis Cardiology signed off, follow up appointment in AVS (2/4) Stable, ordered to d/c telemetry on 1/6. Keep mag above 2, gave 2 g IV mag today  History of DVT/PE/LV thrombus Eliquis  Class III obesity: Body mass index is 45.72 kg/m. Recommend lifestyle modification, weight loss management Recommend outpatient sleep study  9 mm RUL nodule was incidentally found on his chest CT in 03/2017. He needs a repeat CT in 6-12 months, f/u with pcp to repeat ct chest, patient is aware   Liver lesions: Incidental findings on CT: Stable appearance of a 3.8 cm hypoattenuating lesion along the posterior right lobe liver additional subcentimeter hypoattenuating foci within the liver are also remains stable . F/u with pcp .  DVT Prophylaxis: Eliquis  Code Status: Full  Family Communication: patient   Disposition Plan: CIR declined , awaiting for snf placement,  I talked to social worker likely patient can be discharged to SNF on Monday, and to repeat Covid test Sunday  Consultants:  Critical care, signed off  Cardiology, s/o 01/16/2018, pt has f/u appt   General surgery  Interventional radiology  Procedures:   12/23/18: Flexible Sigmoidoscopy  12/28/18:Exploratory laparotomy,Subtotal colectomy, End ileostomy forLeft colonic stricture of unknown etiology, perforated cecum  Intubated for surgery on December 15, activated on December 16  Right arm picc line placement on 12/15   01/04/19:CT Guided Drainage ofPeritoneal Abscess by IR  Antibiotics:  As described from above   Objective: BP 98/63 (BP Location: Right Leg)   Pulse 69   Temp 97.9 F (36.6 C) (Oral)   Resp 16   Ht 5\' 7"  (1.702 m)   Wt 132.4 kg   SpO2 96%   BMI 45.72 kg/m   Intake/Output Summary (Last 24 hours) at 01/21/2019 1137 Last data filed at 01/21/2019 1132 Gross per 24 hour  Intake 462  ml  Output 2750 ml  Net -2288 ml   Filed Weights   01/19/19 0427 01/20/19 0652 01/21/19 0500  Weight: (!) 136.4 kg 133.6 kg 132.4 kg    Exam: Patient is examined daily including today on 01/21/2019, exams remain the same as of yesterday except that has changed    General:  NAD  Cardiovascular: RRR  Respiratory: diminished at basis, no rales, no rhonchi, no wheezing  Abdomen: + right lower quadrant ileostomy with liquid stool output , +right side abdominal drain in place , +abdominal binder , positive BS  Musculoskeletal: No Edema  Neuro: alert, oriented   Data Reviewed: Basic Metabolic Panel: Recent Labs  Lab 01/16/19 1314 01/17/19 0616 01/18/19 0740 01/20/19 0449 01/21/19 0500  NA 135 135 138 137 132*  K 3.9 3.8 4.0 3.8 3.6  CL 100 97* 100 97* 95*  CO2 25 26 26 27 27   GLUCOSE 148* 109* 113* 96 114*  BUN 19 17 17 19 19   CREATININE 1.19 1.07 1.15 1.15 1.21  CALCIUM 7.9* 8.2* 8.3* 8.4* 8.3*  MG  --  1.7  --  1.7 1.7  PHOS  --  3.7  --  4.2  --    Liver Function Tests: Recent Labs  Lab 01/17/19 0616 01/20/19 0449  AST 18 22  ALT 17 20  ALKPHOS 59 58  BILITOT 0.3 0.5  PROT 6.9 7.3  ALBUMIN 1.5* 1.8*  No results for input(s): LIPASE, AMYLASE in the last 168 hours. No results for input(s): AMMONIA in the last 168 hours. CBC: Recent Labs  Lab 01/17/19 0616 01/18/19 0740 01/19/19 0426 01/20/19 0449 01/21/19 0500  WBC 10.4 10.5 11.4* 10.7* 10.4  NEUTROABS 7.0  --   --   --   --   HGB 9.7* 10.0* 10.3* 10.5* 10.4*  HCT 30.5* 32.1* 33.3* 33.8* 33.3*  MCV 88.7 89.9 89.8 88.9 87.9  PLT 326 338 339 311 315   Cardiac Enzymes:   No results for input(s): CKTOTAL, CKMB, CKMBINDEX, TROPONINI in the last 168 hours. BNP (last 3 results) No results for input(s): BNP in the last 8760 hours.  ProBNP (last 3 results) No results for input(s): PROBNP in the last 8760 hours.  CBG: Recent Labs  Lab 01/20/19 1155 01/20/19 1616 01/20/19 2122 01/21/19 0743  01/21/19 1126  GLUCAP 147* 108* 149* 121* 118*    Recent Results (from the past 240 hour(s))  SARS CORONAVIRUS 2 (TAT 6-24 HRS) Nasopharyngeal Nasopharyngeal Swab     Status: None   Collection Time: 01/21/19  4:34 AM   Specimen: Nasopharyngeal Swab  Result Value Ref Range Status   SARS Coronavirus 2 NEGATIVE NEGATIVE Final    Comment: (NOTE) SARS-CoV-2 target nucleic acids are NOT DETECTED. The SARS-CoV-2 RNA is generally detectable in upper and lower respiratory specimens during the acute phase of infection. Negative results do not preclude SARS-CoV-2 infection, do not rule out co-infections with other pathogens, and should not be used as the sole basis for treatment or other patient management decisions. Negative results must be combined with clinical observations, patient history, and epidemiological information. The expected result is Negative. Fact Sheet for Patients: SugarRoll.be Fact Sheet for Healthcare Providers: https://www.woods-mathews.com/ This test is not yet approved or cleared by the [place] FDA and  has been authorized for detection and/or diagnosis of SARS-CoV-2 by FDA under an Emergency Use Authorization (EUA). This EUA will remain  in effect (meaning this test can be used) for the duration of the COVID-19 declaration under Section 56 4(b)(1) of the Act, 21 U.S.C. section 360bbb-3(b)(1), unless the authorization is terminated or revoked sooner. Performed at [facility], [place] [address] [place], [place] [address]      Studies: No results found.  Scheduled Meds: . acetaminophen  650 mg Oral Q6H  . amiodarone  200 mg Oral Daily  . apixaban  5 mg Oral BID  . chlorhexidine  15 mL Mouth Rinse BID  . Chlorhexidine Gluconate Cloth  6 each Topical Daily  . digoxin  0.125 mg Oral Daily  . feeding supplement (ENSURE ENLIVE)  237 mL Oral TID BM  . guaiFENesin  600 mg Oral BID  . insulin aspart  0-20 Units  Subcutaneous TID WC  . insulin aspart  0-5 Units Subcutaneous QHS  . mouth rinse  15 mL Mouth Rinse q12n4p  . methocarbamol  500 mg Oral TID  . multivitamin with minerals  1 tablet Oral Daily  . pantoprazole  40 mg Oral Q1200  . sodium chloride flush  10-40 mL Intracatheter Q12H  . sodium chloride flush  5 mL Intracatheter Q8H  . torsemide  20 mg Oral Daily    Continuous Infusions: . sodium chloride 10 mL/hr at 01/09/19 1800  . sodium chloride    . magnesium sulfate bolus IVPB       Time spent: 24mins I have personally reviewed and interpreted on  01/21/2019 daily labs,  imagings as discussed  above under date review session and assessment and plans.  I reviewed all nursing notes, pharmacy notes, consultant notes,  vitals, pertinent old records  I have discussed plan of care as described above with RN , patient on 01/21/2019   Albertine Grates MD, PhD, FACP  [facility]  [email or web address], password [facility] 01/21/2019, 11:37 AM  LOS: 32 days

## 2019-01-21 NOTE — Progress Notes (Signed)
[facility] Progress Note  24 Days Post-Op  Subjective: CC-  Overall doing well. Tolerating diet and ileostomy functioning. Tolerating dressing changes well. SNF pending.  Objective: Vital signs in last 24 hours: Temp:  [98.2 F (36.8 C)-98.4 F (36.9 C)] 98.4 F (36.9 C) (01/08 0415) Pulse Rate:  [73-75] 75 (01/08 0415) Resp:  [15-22] 15 (01/08 0415) BP: (91-104)/(52-69) 104/68 (01/08 0415) SpO2:  [94 %-98 %] 95 % (01/08 0415) Weight:  [132.4 kg] 132.4 kg (01/08 0500) Last BM Date: 01/21/19  Intake/Output from previous day: 01/07 0701 - 01/08 0700 In: 684 [P.O.:684] Out: 2250 [Urine:1425; Drains:25; Stool:800] Intake/Output this shift: No intake/output data recorded.  PE: Gen:  Alert, NAD, pleasant Pulm:  Rate and effort normal Psych: A&Ox3 Skin: warm and dry Abd: Soft, NT/ND, drain site LLQ c/d/i.RLQ drain with thick tan fluid. Midline wound dehisced without evisceration. Healthy granulation tissue at base and on sides     Lab Results:  Recent Labs    01/20/19 0449 01/21/19 0500  WBC 10.7* 10.4  HGB 10.5* 10.4*  HCT 33.8* 33.3*  PLT 311 315   BMET Recent Labs    01/20/19 0449 01/21/19 0500  NA 137 132*  K 3.8 3.6  CL 97* 95*  CO2 27 27  GLUCOSE 96 114*  BUN 19 19  CREATININE 1.15 1.21  CALCIUM 8.4* 8.3*   PT/INR No results for input(s): LABPROT, INR in the last 72 hours. CMP     Component Value Date/Time   NA 132 (L) 01/21/2019 0500   NA 140 08/26/2018 1355   K 3.6 01/21/2019 0500   CL 95 (L) 01/21/2019 0500   CO2 27 01/21/2019 0500   GLUCOSE 114 (H) 01/21/2019 0500   BUN 19 01/21/2019 0500   BUN 14 08/26/2018 1355   CREATININE 1.21 01/21/2019 0500   CREATININE 1.20 10/29/2016 1236   CALCIUM 8.3 (L) 01/21/2019 0500   PROT 7.3 01/20/2019 0449   PROT 7.4 12/03/2018 1459   ALBUMIN 1.8 (L) 01/20/2019 0449   ALBUMIN 3.6 (L) 12/03/2018 1459   AST 22 01/20/2019 0449   ALT 20 01/20/2019 0449   ALKPHOS 58 01/20/2019 0449   BILITOT 0.5 01/20/2019 0449   BILITOT 0.3 12/03/2018 1459   GFRNONAA >60 01/21/2019 0500   GFRAA >60 01/21/2019 0500   Lipase     Component Value Date/Time   LIPASE 27 12/19/2018 2038       Studies/Results: DG CHEST PORT 1 VIEW  Result Date: 01/19/2019 CLINICAL DATA:  Cough. EXAM: PORTABLE CHEST 1 VIEW COMPARISON:  12/28/2018 FINDINGS: The patient has been extubated. There is a right-sided PICC line is well position. There is a left-sided AICD that is well position. The heart size is enlarged. There are bilateral pleural effusions, right greater than left. Bibasilar atelectasis is noted. There is mild vascular congestion without overt pulmonary edema. IMPRESSION: 1. Well-positioned right-sided PICC line. 2. Cardiomegaly with vascular congestion. 3. Right lower lung zone opacity favored to represent a combination of both atelectasis and a small right-sided pleural effusion. Electronically Signed   By: Constance Holster M.D.   On: 01/19/2019 18:58    Anti-infectives: Anti-infectives (From admission, onward)   Start     Dose/Rate Route Frequency Ordered Stop   12/28/18 1400  fluconazole (DIFLUCAN) IVPB 400 mg     400 mg 100 mL/hr over 120 Minutes Intravenous Every 24 hours 12/28/18 1318 01/09/19 2359   12/20/18 2200  piperacillin-tazobactam (ZOSYN) IVPB 3.375 g     3.375 g 12.5  mL/hr over 240 Minutes Intravenous Every 8 hours 12/20/18 1834 01/09/19 2359   12/20/18 0645  piperacillin-tazobactam (ZOSYN) IVPB 3.375 g  Status:  Discontinued     3.375 g 100 mL/hr over 30 Minutes Intravenous Every 6 hours 12/20/18 0630 12/20/18 1833   12/19/18 2315  piperacillin-tazobactam (ZOSYN) IVPB 3.375 g     3.375 g 100 mL/hr over 30 Minutes Intravenous  Once 12/19/18 2302 12/20/18 0003       Assessment/Plan CHF EF 25-30% Atrial fibrillation Hx DVT/PE LV mural thrombus Gross anticoagulant Morbid obesityBMI 50.63 AKI onCKD Dehydration R elbow pain Severe malnutrition - prealbumin  improving 17.8 (1/4)  Left colonic stricture of unknown etiology; with perforated cecum POD#24STC/ileostomy fordiverticular stricture of left colon and perforated R colon12/15/2020,Dr. Emelia Loron -Pathwithout evidence of malignancy on pathology -S/p drain placement 12/22 - cxsE coli pansensitive,abx stopped 12/27.Repeat CT 1/4 w/ decrease in size of fluid collection w/ catheter in favorable position. IR rec leaving drain in place. Monitor output -Ileostomy functioning, tolerating a diet.Prealbumin improving. TPN off - wound dehisced, monitor. BID wet to dry dressing changes with adaptic in the base - abdominal binder - Surgical drain removed LLQ1/4  QDI:YMEBRA and supplements XEN:[id number]>> 12/28 ID: IV Zosyn 12/6>>12/27, IV diflucan 12/15>>12/27 Follow up: Dr. Dwain Sarna  Plan: Patient stable for discharge to SNF from surgical standpoint. Discharge instructions and follow up info on AVS. We will follow PRN over the weekend, and see again Monday if he is still here.    LOS: 32 days    Franne Forts, [facility] Surgery 01/21/2019, 10:21 AM Please see Amion for pager number during day hours 7:00am-4:30pm

## 2019-01-21 NOTE — TOC Progression Note (Signed)
Transition of Care [facility]) - Progression Note    Patient Details  Name: [name] MRN: [id number] Date of Birth: 1965-11-23  Transition of [facility]) CM/SW Contact  Doy Hutching, [place] Phone Number: 01/21/2019, 3:02 PM  Clinical Narrative:    CSW spoke with pt via room phone. Pt did well with therapy and was happy to be up in chair. He spoke with his family and has decided on [facility]. He had questions about if they would be able to manage wound care. CSW briefly explained hub and how clinicals are accessible by SNF.   CSW confirmed bed offer and acceptance with Tresa Endo in admissions at [facility]. Made her aware that pt has wound care needs. She will run [employer] and [employer] authorization. This will be delayed due to weekend and that it is not a Medicare plan (no waiver at this time). MD aware and will order new COVID test for Sunday evening.   TOC team continuing to follow.    Expected Discharge Plan: IP Rehab Facility Barriers to Discharge: Continued Medical Work up, English as a second language teacher  Expected Discharge Plan and Services Expected Discharge Plan: IP Rehab Facility In-house Referral: Clinical Social Work Discharge Planning Services: CM Consult Post Acute Care Choice: IP Rehab Living arrangements for the past 2 months: Apartment   Readmission Risk Interventions Readmission Risk Prevention Plan 01/04/2019  Transportation Screening Complete  PCP or Specialist Appt within 3-5 Days Not Complete  Not Complete comments pt not stable for dc at this time  HRI or Home Care Consult Complete  Social Work Consult for Recovery Care Planning/Counseling Complete  Palliative Care Screening Not Applicable  Medication Review Ocean[employer]) Referral to Pharmacy  Some recent data might be hidden

## 2019-01-21 NOTE — Social Work (Signed)
CSW called into pt room to discuss offers.  Pt busy with other providers. CSW continuing to follow for support with disposition, will reattempt f/u as able this afternoon.   Octavio Graves, MSW, [facility] Clinical Social Work [phone]

## 2019-01-22 LAB — CBC
HCT: 33.2 % — ABNORMAL LOW (ref 39.0–52.0)
Hemoglobin: 10.3 g/dL — ABNORMAL LOW (ref 13.0–17.0)
MCH: 27.4 pg (ref 26.0–34.0)
MCHC: 31 g/dL (ref 30.0–36.0)
MCV: 88.3 fL (ref 80.0–100.0)
Platelets: 302 10*3/uL (ref 150–400)
RBC: 3.76 MIL/uL — ABNORMAL LOW (ref 4.22–5.81)
RDW: 14.2 % (ref 11.5–15.5)
WBC: 10.3 10*3/uL (ref 4.0–10.5)
nRBC: 0 % (ref 0.0–0.2)

## 2019-01-22 LAB — BASIC METABOLIC PANEL
Anion gap: 11 (ref 5–15)
BUN: 18 mg/dL (ref 6–20)
CO2: 28 mmol/L (ref 22–32)
Calcium: 8.3 mg/dL — ABNORMAL LOW (ref 8.9–10.3)
Chloride: 95 mmol/L — ABNORMAL LOW (ref 98–111)
Creatinine, Ser: 1.14 mg/dL (ref 0.61–1.24)
GFR calc Af Amer: >60 mL/min (ref >60)
GFR calc non Af Amer: >60 mL/min (ref >60)
Glucose, Bld: 100 mg/dL — ABNORMAL HIGH (ref 70–99)
Potassium: 3.5 mmol/L (ref 3.5–5.1)
Sodium: 134 mmol/L — ABNORMAL LOW (ref 135–145)

## 2019-01-22 LAB — GLUCOSE, CAPILLARY
Glucose-Capillary: 139 mg/dL — ABNORMAL HIGH (ref 70–99)
Glucose-Capillary: 106 mg/dL — ABNORMAL HIGH (ref 70–99)
Glucose-Capillary: 124 mg/dL — ABNORMAL HIGH (ref 70–99)
Glucose-Capillary: 133 mg/dL — ABNORMAL HIGH (ref 70–99)

## 2019-01-22 MED ORDER — POTASSIUM CHLORIDE CRYS ER 20 MEQ PO TBCR
40.0000 meq | EXTENDED_RELEASE_TABLET | Freq: Once | ORAL | Status: AC
  Administered 2019-01-22: 40 meq via ORAL
  Filled 2019-01-22: qty 2

## 2019-01-22 NOTE — Progress Notes (Signed)
PROGRESS NOTE  Roberto [name] LGX:[id number] DOB: 27-Feb-1965 DOA: 12/19/2018 PCP: Denita Lung, MD  Brief Narrative:   Pt. With history of NICM, EF 25-30%, PSVT, PAF, chronic systolic HF with AICD, hx of DVT/PE/LV thrombus , on chronic anticoagulation, was admitted 12/20/2018 for acute diverticulitis. He was initially treated conservatively, but had no real improvement and was taken to the OR 12/28/2018. Pt had Left colonic stricture of unknown etiology, and perforated cecum. Per surgery's note there was a mass in the mid descending colon(pathology negative for malignancy). The right colon was ischemic and had perforated. There was visualization of leakage of a large amount of stool throughout his entire abdominal cavity. In OR patient became hypotensive, required pressors (Phenylephrine and Levo). Surgery felt the patient was not ready for extubation, and considering co morbidities and possible septic insult asked PCCM to admit to ICU manage care. Transferred to [facility] service on 01/03/2019 now off pressors.     HPI/Recap of past 24 hours:  Tolerating regular diet,  Colostomy with liquid stool output Drain output 10cc the last 24hr,  2200 cc Urine output documented last 24hrs he denies pain, no fever, no cough Awaiting for skilled nursing facility placement   Assessment/Plan: Principal Problem:   Acute diverticulitis Active Problems:   Obesity, morbid (HCC)   Nonischemic cardiomyopathy (HCC)   Paroxysmal SVT (supraventricular tachycardia) (HCC)   Chronic systolic heart failure (HCC)   Chronic pain of right knee   History of DVT (deep vein thrombosis)   PAF (paroxysmal atrial fibrillation) (HCC)   Pre-operative cardiovascular examination   Bowel obstruction (HCC)   Respiratory failure ([place])   Acute on chronic systolic heart failure (HCC)   Left colonic stricture of unknown etiology; path negative for malignancy, s/p STC/ileostomyon 12/28/2018,Dr. Rolm Bookbinder -he  required TPN initially, now he is tolerating regular diet,  -he is cleared to discharge from surgical stand point and close follow up with general surgery (appointment made on January 29 with Dr. Donne Hazel). - currently he is awaiting for snf and general surgery continue to follow him while he is here.  perforated R colon with right lateral peritoneal fluid collection s/p right lateral abdominal drain placement in IR 01/04/2019 by IR Dr. Kathlene Cote.  - cxsE coli pansensitive,abx (zosyn and diflucan) stopped 12/27. -Repeat CT 1/4 w/ decrease in size of fluid collection w/ catheter in favorable position. - IR rec leaving drain in place at discharge ,with daily flushes of 5-10 mL sterile saline. Continue to record output daily.  - follow up with IR in 1-2 qweeks   Septic shock secondary to perforated cecum and contamination of abdominal cavity with stool s/p subtotal colectomy and ileostomy- -Was onphenylephrine, levophed + vasopressin initially,  Off pressor on 12/20 -sepsis resolved/recovered well   Acute hypoxic respiratory failure possibly secondary to postop atelectasis -Intubated on 12/15, extubated on 12/16 -patient complained of dry coughs on 1/7,  Repeat chest x-ray on 1/7 continue to have signs of vascular congestion, right lower lung zone opacity could represent a combination of both atelectasis and a small right-sided pleural effusion.  -There is no hypoxia, continue diuresis with good urine output,  Mucinex , incentive spirometer, out of bed -improving  Chronic combined systolic and diastolic CHF -Last 2D echo LVEF 25 to 30%, Status post AICD -he is currently on demadex  20mg  daily. Digoxin 0.125 daily -She is off Entresto and spironolactone due to sepsis and hypotension, -case discussed with  cardiology Dr. Meda Coffee  Who recommend continue hold these meds  for now, Dr Clarise Cruz to decide on when to resume entresto at hospital follow up appointment  -he has an appointment with  cardiology Dr Clarise Cruz on 2/4  Paroxysmal A. fib Currently in sinus rhythm , continue digoxin, amiodarone, Eliquis Cardiology signed off, follow up appointment in [facility] (2/4) Keep mag above 2 k 3.5 this morning , will give k, repeat bmp in am  History of DVT/PE/LV thrombus, lupus anticoagulant positive Eliquis  Class III obesity: Body mass index is 45.16 kg/m. Recommend lifestyle modification, weight loss management Recommend outpatient sleep study  9 mm RUL nodule was incidentally found on his chest CT in 03/2017. He needs a repeat CT in 6-12 months, f/u with pcp to repeat ct chest, patient is aware   Liver lesions: Incidental findings on CT: Stable appearance of a 3.8 cm hypoattenuating lesion along the posterior right lobe liver additional subcentimeter hypoattenuating foci within the liver are also remains stable . F/u with pcp .  DVT Prophylaxis: Eliquis  Code Status: Full  Family Communication: patient   Disposition Plan: CIR declined , awaiting for snf placement,  likely can be discharged to SNF on Monday,  repeat Covid test on Sunday  Consultants:  Critical care, signed off  Cardiology, s/o 01/16/2018, pt has f/u appt   General surgery  Interventional radiology  Procedures:   12/23/18: Flexible Sigmoidoscopy  12/28/18:Exploratory laparotomy,Subtotal colectomy, End ileostomy forLeft colonic stricture of unknown etiology, perforated cecum  Intubated for surgery on December 15, activated on December 16  Right arm picc line placement on 12/15   01/04/19:CT Guided Drainage ofPeritoneal Abscess by IR  Antibiotics:  As described from above   Objective: BP 102/70 (BP Location: Left Arm)   Pulse 71   Temp 98 F (36.7 C) (Oral)   Resp 18   Ht 5\' 7"  (1.702 m)   Wt 130.8 kg   SpO2 98%   BMI 45.16 kg/m   Intake/Output Summary (Last 24 hours) at 01/22/2019 0715 Last data filed at 01/22/2019 0436 Gross per 24 hour  Intake 1312 ml  Output  2710 ml  Net -1398 ml   Filed Weights   01/20/19 0652 01/21/19 0500 01/22/19 0458  Weight: 133.6 kg 132.4 kg 130.8 kg    Exam: Patient is examined daily including today on 01/22/2019, exams remain the same as of yesterday except that has changed    General:  NAD, + picc line right arm  Cardiovascular: RRR  Respiratory: diminished at basis, no rales, no rhonchi, no wheezing  Abdomen: + right lower quadrant ileostomy with liquid stool output , +right side abdominal drain in place , +abdominal binder , positive BS  Musculoskeletal: No Edema  Neuro: alert, oriented   Data Reviewed: Basic Metabolic Panel: Recent Labs  Lab 01/17/19 0616 01/18/19 0740 01/20/19 0449 01/21/19 0500 01/22/19 0421  NA 135 138 137 132* 134*  K 3.8 4.0 3.8 3.6 3.5  CL 97* 100 97* 95* 95*  CO2 26 26 27 27 28   GLUCOSE 109* 113* 96 114* 100*  BUN 17 17 19 19 18   CREATININE 1.07 1.15 1.15 1.21 1.14  CALCIUM 8.2* 8.3* 8.4* 8.3* 8.3*  MG 1.7  --  1.7 1.7  --   PHOS 3.7  --  4.2  --   --    Liver Function Tests: Recent Labs  Lab 01/17/19 0616 01/20/19 0449  AST 18 22  ALT 17 20  ALKPHOS 59 58  BILITOT 0.3 0.5  PROT 6.9 7.3  ALBUMIN 1.5* 1.8*  No results for input(s): LIPASE, AMYLASE in the last 168 hours. No results for input(s): AMMONIA in the last 168 hours. CBC: Recent Labs  Lab 01/17/19 0616 01/18/19 0740 01/19/19 0426 01/20/19 0449 01/21/19 0500 01/22/19 0421  WBC 10.4 10.5 11.4* 10.7* 10.4 10.3  NEUTROABS 7.0  --   --   --   --   --   HGB 9.7* 10.0* 10.3* 10.5* 10.4* 10.3*  HCT 30.5* 32.1* 33.3* 33.8* 33.3* 33.2*  MCV 88.7 89.9 89.8 88.9 87.9 88.3  PLT 326 338 339 311 315 302   Cardiac Enzymes:   No results for input(s): CKTOTAL, CKMB, CKMBINDEX, TROPONINI in the last 168 hours. BNP (last 3 results) No results for input(s): BNP in the last 8760 hours.  ProBNP (last 3 results) No results for input(s): PROBNP in the last 8760 hours.  CBG: Recent Labs  Lab  01/20/19 2122 01/21/19 0743 01/21/19 1126 01/21/19 1648 01/21/19 2113  GLUCAP 149* 121* 118* 124* 100*    Recent Results (from the past 240 hour(s))  SARS CORONAVIRUS 2 (TAT 6-24 HRS) Nasopharyngeal Nasopharyngeal Swab     Status: None   Collection Time: 01/21/19  4:34 AM   Specimen: Nasopharyngeal Swab  Result Value Ref Range Status   SARS Coronavirus 2 NEGATIVE NEGATIVE Final    Comment: (NOTE) SARS-CoV-2 target nucleic acids are NOT DETECTED. The SARS-CoV-2 RNA is generally detectable in upper and lower respiratory specimens during the acute phase of infection. Negative results do not preclude SARS-CoV-2 infection, do not rule out co-infections with other pathogens, and should not be used as the sole basis for treatment or other patient management decisions. Negative results must be combined with clinical observations, patient history, and epidemiological information. The expected result is Negative. Fact Sheet for Patients: HairSlick.no Fact Sheet for Healthcare Providers: quierodirigir.com This test is not yet approved or cleared by the [place] FDA and  has been authorized for detection and/or diagnosis of SARS-CoV-2 by FDA under an Emergency Use Authorization (EUA). This EUA will remain  in effect (meaning this test can be used) for the duration of the COVID-19 declaration under Section 56 4(b)(1) of the Act, 21 U.S.C. section 360bbb-3(b)(1), unless the authorization is terminated or revoked sooner. Performed at [facility], [address] [place], [place] [address]      Studies: No results found.  Scheduled Meds: . acetaminophen  650 mg Oral Q6H  . amiodarone  200 mg Oral Daily  . apixaban  5 mg Oral BID  . chlorhexidine  15 mL Mouth Rinse BID  . Chlorhexidine Gluconate Cloth  6 each Topical Daily  . digoxin  0.125 mg Oral Daily  . feeding supplement (ENSURE ENLIVE)  237 mL Oral TID BM  .  guaiFENesin  600 mg Oral BID  . insulin aspart  0-20 Units Subcutaneous TID WC  . insulin aspart  0-5 Units Subcutaneous QHS  . mouth rinse  15 mL Mouth Rinse q12n4p  . methocarbamol  500 mg Oral TID  . multivitamin with minerals  1 tablet Oral Daily  . pantoprazole  40 mg Oral Q1200  . sodium chloride flush  10-40 mL Intracatheter Q12H  . sodium chloride flush  5 mL Intracatheter Q8H  . torsemide  20 mg Oral Daily    Continuous Infusions: . sodium chloride 10 mL/hr at 01/09/19 1800  . sodium chloride       Time spent: I have personally reviewed and interpreted on  01/22/2019 daily labs,  imagings  as discussed above under date review session and assessment and plans.  I reviewed all nursing notes, pharmacy notes, consultant notes,  vitals, pertinent old records  I have discussed plan of care as described above with RN , patient on 01/22/2019   Albertine Grates MD, PhD, FACP  [facility]  [email or web address], password [facility] [place] [facility] 01/22/2019, 7:15 AM  LOS: 33 days

## 2019-01-23 LAB — BASIC METABOLIC PANEL
Anion gap: 10 (ref 5–15)
BUN: 20 mg/dL (ref 6–20)
CO2: 26 mmol/L (ref 22–32)
Calcium: 8.7 mg/dL — ABNORMAL LOW (ref 8.9–10.3)
Chloride: 99 mmol/L (ref 98–111)
Creatinine, Ser: 1.25 mg/dL — ABNORMAL HIGH (ref 0.61–1.24)
GFR calc Af Amer: >60 mL/min (ref >60)
GFR calc non Af Amer: >60 mL/min (ref >60)
Glucose, Bld: 102 mg/dL — ABNORMAL HIGH (ref 70–99)
Potassium: 4 mmol/L (ref 3.5–5.1)
Sodium: 135 mmol/L (ref 135–145)

## 2019-01-23 LAB — GLUCOSE, CAPILLARY
Glucose-Capillary: 102 mg/dL — ABNORMAL HIGH (ref 70–99)
Glucose-Capillary: 101 mg/dL — ABNORMAL HIGH (ref 70–99)
Glucose-Capillary: 126 mg/dL — ABNORMAL HIGH (ref 70–99)
Glucose-Capillary: 105 mg/dL — ABNORMAL HIGH (ref 70–99)

## 2019-01-23 LAB — SARS CORONAVIRUS 2 (TAT 6-24 HRS): SARS Coronavirus 2: NEGATIVE

## 2019-01-23 LAB — MAGNESIUM: Magnesium: 1.8 mg/dL (ref 1.7–2.4)

## 2019-01-23 MED ORDER — TORSEMIDE 20 MG PO TABS
20.0000 mg | ORAL_TABLET | ORAL | Status: DC
  Administered 2019-01-24: 20 mg via ORAL
  Filled 2019-01-23: qty 1

## 2019-01-23 NOTE — Progress Notes (Signed)
PROGRESS NOTE  Roberto [name] [id number]:[id number] DOB: 1965/04/06 DOA: 12/19/2018 PCP: Denita Lung, MD  Brief Narrative:   Pt. With history of NICM, EF 25-30%, PSVT, PAF, chronic systolic HF with AICD, hx of DVT/PE/LV thrombus , on chronic anticoagulation, was admitted 12/20/2018 for acute diverticulitis. He was initially treated conservatively, but had no real improvement and was taken to the OR 12/28/2018. Pt had Left colonic stricture of unknown etiology, and perforated cecum. Per surgery's note there was a mass in the mid descending colon(pathology negative for malignancy). The right colon was ischemic and had perforated. There was visualization of leakage of a large amount of stool throughout his entire abdominal cavity. In OR patient became hypotensive, required pressors (Phenylephrine and Levo). Surgery felt the patient was not ready for extubation, and considering co morbidities and possible septic insult asked PCCM to admit to ICU manage care. Transferred to [facility] service on 01/03/2019 now off pressors.     HPI/Recap of past 24 hours:  Uneventful night , he denies pain, no fever, no cough Colostomy with liquid stool output I do not see drain output documentation for the last 24hr,    Awaiting for skilled nursing facility placement   Assessment/Plan: Principal Problem:   Acute diverticulitis Active Problems:   Obesity, morbid ([place])   Nonischemic cardiomyopathy (HCC)   Paroxysmal SVT (supraventricular tachycardia) (HCC)   Chronic systolic heart failure (HCC)   Chronic pain of right knee   History of DVT (deep vein thrombosis)   PAF (paroxysmal atrial fibrillation) (HCC)   Pre-operative cardiovascular examination   Bowel obstruction (HCC)   Respiratory failure (HCC)   Acute on chronic systolic heart failure (HCC)   Hypomagnesemia   Left colonic stricture of unknown etiology; path negative for malignancy, s/p STC/ileostomyon 12/28/2018,Dr. Rolm Bookbinder -he  required TPN initially, now he is tolerating regular diet,  -he is cleared to discharge from surgical stand point and close follow up with general surgery (appointment made on January 29 with Dr. Donne Hazel). - currently he is awaiting for snf and general surgery continue to follow him while he is here.  perforated R colon with right lateral peritoneal fluid collection s/p right lateral abdominal drain placement in IR 01/04/2019 by IR Dr. Kathlene Cote.  - cxsE coli pansensitive,abx (zosyn and diflucan) stopped 12/27. -Repeat CT 1/4 w/ decrease in size of fluid collection w/ catheter in favorable position. - IR rec leaving drain in place at discharge ,with daily flushes of 5-10 mL sterile saline. Continue to record output daily.  - follow up with IR in 1-2 qweeks   Septic shock secondary to perforated cecum and contamination of abdominal cavity with stool s/p subtotal colectomy and ileostomy- -Was onphenylephrine, levophed + vasopressin initially,  Off pressor on 12/20 -sepsis resolved/recovered well   Acute hypoxic respiratory failure possibly secondary to postop atelectasis -Intubated on 12/15, extubated on 12/16 -patient complained of dry coughs on 1/7,  Repeat chest x-ray on 1/7 continue to have signs of vascular congestion, right lower lung zone opacity could represent a combination of both atelectasis and a small right-sided pleural effusion.  -There is no hypoxia, continue diuresis with good urine output,  Mucinex , incentive spirometer, out of bed -improving, in fact now he clinically appears dry, diuretic dose decreased.on 1/10  Chronic combined systolic and diastolic CHF -Last 2D echo LVEF 25 to 30%, Status post AICD -he is off Entresto and spironolactone due to sepsis and hypotension, -case discussed with  cardiology Dr. Meda Coffee  Who recommend continue hold  these meds for now, Dr Clarise Cruz to decide on when to resume entresto at hospital follow up appointment  -Continue digoxin 0.125  daily , change Demadex from 20 mg daily to 20 mg Monday Wednesday Friday on 1/10 due to clinically patient appears dry , creatinine appears to trend up  -he has an appointment with cardiology Dr Clarise Cruz on 2/4  Paroxysmal A. fib Currently in sinus rhythm , continue digoxin, amiodarone, Eliquis Cardiology signed off, follow up appointment in [facility] (2/4) Keep mag above 2, keep K above 4  CKDII Cr fluctuating , appear slightly trend up, changed demadex from daily to mwf, repeat bmp in am   History of DVT/PE/LV thrombus, lupus anticoagulant positive Continue Eliquis  Class III obesity: Body mass index is 45.06 kg/m. Recommend lifestyle modification, weight loss management Recommend outpatient sleep study  9 mm RUL nodule was incidentally found on his chest CT in 03/2017. He needs a repeat CT in 6-12 months, f/u with pcp to repeat ct chest, patient is aware   Liver lesions: Incidental findings on CT: Stable appearance of a 3.8 cm hypoattenuating lesion along the posterior right lobe liver additional subcentimeter hypoattenuating foci within the liver are also remains stable . F/u with pcp .  DVT Prophylaxis: Eliquis  Code Status: Full  Family Communication: patient   Disposition Plan: CIR declined , awaiting for snf placement,  Need to remove picc line prior discharge unless otherwise indicated  likely can be discharged to SNF on Monday,  repeat Covid test on Sunday ordered   Consultants:  Critical care, signed off  Cardiology, s/o 01/16/2018, pt has f/u appt   General surgery  Interventional radiology  Procedures:   12/23/18: Flexible Sigmoidoscopy  12/28/18:Exploratory laparotomy,Subtotal colectomy, End ileostomy forLeft colonic stricture of unknown etiology, perforated cecum  Intubated for surgery on December 15, activated on December 16  Right arm picc line placement on 12/15   01/04/19:CT Guided Drainage ofPeritoneal Abscess by IR  Antibiotics:  As  described from above   Objective: BP 109/76 (BP Location: Left Arm)   Pulse 78   Temp 98.1 F (36.7 C) (Oral)   Resp 15   Ht 5\' 7"  (1.702 m)   Wt 130.5 kg   SpO2 98%   BMI 45.06 kg/m   Intake/Output Summary (Last 24 hours) at 01/23/2019 0805 Last data filed at 01/22/2019 2100 Gross per 24 hour  Intake -  Output 500 ml  Net -500 ml   Filed Weights   01/21/19 0500 01/22/19 0458 01/23/19 0520  Weight: 132.4 kg 130.8 kg 130.5 kg    Exam: Patient is examined daily including today on 01/23/2019, exams remain the same as of yesterday except that has changed    General:  NAD, + picc line right arm  Cardiovascular: RRR  Respiratory: diminished at basis, no rales, no rhonchi, no wheezing  Abdomen: + right lower quadrant ileostomy with liquid stool output , +right side abdominal drain in place , +abdominal binder , positive BS  Musculoskeletal: No Edema  Neuro: alert, oriented   Data Reviewed: Basic Metabolic Panel: Recent Labs  Lab 01/17/19 0616 01/18/19 0740 01/20/19 0449 01/21/19 0500 01/22/19 0421 01/23/19 0550  NA 135 138 137 132* 134* 135  K 3.8 4.0 3.8 3.6 3.5 4.0  CL 97* 100 97* 95* 95* 99  CO2 26 26 27 27 28 26   GLUCOSE 109* 113* 96 114* 100* 102*  BUN 17 17 19 19 18 20   CREATININE 1.07 1.15 1.15 1.21 1.14 1.25*  CALCIUM  8.2* 8.3* 8.4* 8.3* 8.3* 8.7*  MG 1.7  --  1.7 1.7  --  1.8  PHOS 3.7  --  4.2  --   --   --    Liver Function Tests: Recent Labs  Lab 01/17/19 0616 01/20/19 0449  AST 18 22  ALT 17 20  ALKPHOS 59 58  BILITOT 0.3 0.5  PROT 6.9 7.3  ALBUMIN 1.5* 1.8*   No results for input(s): LIPASE, AMYLASE in the last 168 hours. No results for input(s): AMMONIA in the last 168 hours. CBC: Recent Labs  Lab 01/17/19 0616 01/18/19 0740 01/19/19 0426 01/20/19 0449 01/21/19 0500 01/22/19 0421  WBC 10.4 10.5 11.4* 10.7* 10.4 10.3  NEUTROABS 7.0  --   --   --   --   --   HGB 9.7* 10.0* 10.3* 10.5* 10.4* 10.3*  HCT 30.5* 32.1* 33.3* 33.8*  33.3* 33.2*  MCV 88.7 89.9 89.8 88.9 87.9 88.3  PLT 326 338 339 311 315 302   Cardiac Enzymes:   No results for input(s): CKTOTAL, CKMB, CKMBINDEX, TROPONINI in the last 168 hours. BNP (last 3 results) No results for input(s): BNP in the last 8760 hours.  ProBNP (last 3 results) No results for input(s): PROBNP in the last 8760 hours.  CBG: Recent Labs  Lab 01/22/19 0736 01/22/19 1118 01/22/19 1605 01/22/19 2128 01/23/19 0756  GLUCAP 139* 106* 124* 133* 102*    Recent Results (from the past 240 hour(s))  SARS CORONAVIRUS 2 (TAT 6-24 HRS) Nasopharyngeal Nasopharyngeal Swab     Status: None   Collection Time: 01/21/19  4:34 AM   Specimen: Nasopharyngeal Swab  Result Value Ref Range Status   SARS Coronavirus 2 NEGATIVE NEGATIVE Final    Comment: (NOTE) SARS-CoV-2 target nucleic acids are NOT DETECTED. The SARS-CoV-2 RNA is generally detectable in upper and lower respiratory specimens during the acute phase of infection. Negative results do not preclude SARS-CoV-2 infection, do not rule out co-infections with other pathogens, and should not be used as the sole basis for treatment or other patient management decisions. Negative results must be combined with clinical observations, patient history, and epidemiological information. The expected result is Negative. Fact Sheet for Patients: HairSlick.no Fact Sheet for Healthcare Providers: quierodirigir.com This test is not yet approved or cleared by the [place] FDA and  has been authorized for detection and/or diagnosis of SARS-CoV-2 by FDA under an Emergency Use Authorization (EUA). This EUA will remain  in effect (meaning this test can be used) for the duration of the COVID-19 declaration under Section 56 4(b)(1) of the Act, 21 U.S.C. section 360bbb-3(b)(1), unless the authorization is terminated or revoked sooner. Performed at [facility], [address], [place], [place] [address]      Studies: No results found.  Scheduled Meds: . acetaminophen  650 mg Oral Q6H  . amiodarone  200 mg Oral Daily  . apixaban  5 mg Oral BID  . chlorhexidine  15 mL Mouth Rinse BID  . Chlorhexidine Gluconate Cloth  6 each Topical Daily  . digoxin  0.125 mg Oral Daily  . feeding supplement (ENSURE ENLIVE)  237 mL Oral TID BM  . guaiFENesin  600 mg Oral BID  . insulin aspart  0-20 Units Subcutaneous TID WC  . insulin aspart  0-5 Units Subcutaneous QHS  . mouth rinse  15 mL Mouth Rinse q12n4p  . methocarbamol  500 mg Oral TID  . multivitamin with minerals  1 tablet Oral Daily  .  pantoprazole  40 mg Oral Q1200  . sodium chloride flush  10-40 mL Intracatheter Q12H  . sodium chloride flush  5 mL Intracatheter Q8H  . torsemide  20 mg Oral Daily    Continuous Infusions: . sodium chloride 10 mL/hr at 01/09/19 1800  . sodium chloride       Time spent: I have personally reviewed and interpreted on  01/23/2019 daily labs,  imagings as discussed above under date review session and assessment and plans.  I reviewed all nursing notes, pharmacy notes, consultant notes,  vitals, pertinent old records  I have discussed plan of care as described above with RN , patient on 01/23/2019   Albertine Grates MD, PhD, FACP  [facility]  [email or web address], password [facility] 01/23/2019, 8:05 AM  LOS: 34 days

## 2019-01-24 LAB — COMPREHENSIVE METABOLIC PANEL
ALT: 19 U/L (ref 0–44)
AST: 20 U/L (ref 15–41)
Albumin: 2 g/dL — ABNORMAL LOW (ref 3.5–5.0)
Alkaline Phosphatase: 54 U/L (ref 38–126)
Anion gap: 9 (ref 5–15)
BUN: 21 mg/dL — ABNORMAL HIGH (ref 6–20)
CO2: 28 mmol/L (ref 22–32)
Calcium: 8.6 mg/dL — ABNORMAL LOW (ref 8.9–10.3)
Chloride: 100 mmol/L (ref 98–111)
Creatinine, Ser: 1.11 mg/dL (ref 0.61–1.24)
GFR calc Af Amer: >60 mL/min (ref >60)
GFR calc non Af Amer: >60 mL/min (ref >60)
Glucose, Bld: 101 mg/dL — ABNORMAL HIGH (ref 70–99)
Potassium: 4.3 mmol/L (ref 3.5–5.1)
Sodium: 137 mmol/L (ref 135–145)
Total Bilirubin: 0.5 mg/dL (ref 0.3–1.2)
Total Protein: 7.2 g/dL (ref 6.5–8.1)

## 2019-01-24 LAB — CBC WITH DIFFERENTIAL/PLATELET
Abs Immature Granulocytes: 0.14 10*3/uL — ABNORMAL HIGH (ref 0.00–0.07)
Basophils Absolute: 0.1 10*3/uL (ref 0.0–0.1)
Basophils Relative: 1 %
Eosinophils Absolute: 0.3 10*3/uL (ref 0.0–0.5)
Eosinophils Relative: 3 %
HCT: 33.1 % — ABNORMAL LOW (ref 39.0–52.0)
Hemoglobin: 10.4 g/dL — ABNORMAL LOW (ref 13.0–17.0)
Immature Granulocytes: 2 %
Lymphocytes Relative: 24 %
Lymphs Abs: 2.2 10*3/uL (ref 0.7–4.0)
MCH: 27.7 pg (ref 26.0–34.0)
MCHC: 31.4 g/dL (ref 30.0–36.0)
MCV: 88 fL (ref 80.0–100.0)
Monocytes Absolute: 1.1 10*3/uL — ABNORMAL HIGH (ref 0.1–1.0)
Monocytes Relative: 12 %
Neutro Abs: 5.4 10*3/uL (ref 1.7–7.7)
Neutrophils Relative %: 58 %
Platelets: 274 10*3/uL (ref 150–400)
RBC: 3.76 MIL/uL — ABNORMAL LOW (ref 4.22–5.81)
RDW: 14 % (ref 11.5–15.5)
WBC: 9.3 10*3/uL (ref 4.0–10.5)
nRBC: 0 % (ref 0.0–0.2)

## 2019-01-24 LAB — GLUCOSE, CAPILLARY
Glucose-Capillary: 101 mg/dL — ABNORMAL HIGH (ref 70–99)
Glucose-Capillary: 143 mg/dL — ABNORMAL HIGH (ref 70–99)
Glucose-Capillary: 116 mg/dL — ABNORMAL HIGH (ref 70–99)

## 2019-01-24 LAB — MAGNESIUM: Magnesium: 1.8 mg/dL (ref 1.7–2.4)

## 2019-01-24 LAB — PREALBUMIN: Prealbumin: 22.1 mg/dL (ref 18–38)

## 2019-01-24 LAB — PHOSPHORUS: Phosphorus: 3.9 mg/dL (ref 2.5–4.6)

## 2019-01-24 MED ORDER — TORSEMIDE 20 MG PO TABS: 20.0000 mg | ORAL_TABLET | ORAL | 2 refills | Status: DC

## 2019-01-24 NOTE — TOC Initial Note (Signed)
Transition of Care [facility]) - Initial/Assessment Note    Patient Details  Name: Roberto [name] MRN: [id number] Date of Birth: 1965/03/11  Transition of [facility]) CM/SW Contact:    Gabrielle Dare Phone Number: 01/24/2019, 11:41 AM  Clinical Narrative:                 Patient will Discharge To: [place] Date:01/24/2019 Family Notified:yes, Roberto [name] (son) [phone] Transport FU:XNAT   Per MD patient ready for DC to [facility] . RN, patient, patient's family, and facility notified of DC. Assessment, Fl2/Pasrr, and Discharge Summary sent to facility. RN given number for report [phone]. Room 4A). DC packet on chart. Ambulance transport requested for patient.   CSW signing off.  Reed Breech LCSWA [phone]    Expected Discharge Plan: IP Rehab Facility Barriers to Discharge: No Barriers Identified   Patient Goals and CMS Choice   CMS Medicare.gov Compare Post Acute Care list provided to:: (n/a at this time) Choice offered to / list presented to : Patient  Expected Discharge Plan and Services Expected Discharge Plan: [place] In-house Referral: Clinical Social Work Discharge Planning Services: CM Consult Post Acute Care Choice: IP Rehab Living arrangements for the past 2 months: Apartment Expected Discharge Date: 01/24/19                                    Prior Living Arrangements/Services Living arrangements for the past 2 months: Apartment Lives with:: Adult Children Patient language and need for interpreter reviewed:: Yes(no needs) Do you feel safe going back to the place where you live?: Yes      Need for Family Participation in Patient Care: Yes (Comment)(assistance w/ ADL/IADLs) Care giver support system in place?: Yes (comment)(adult children; siblings)   Criminal Activity/Legal Involvement Pertinent to Current Situation/Hospitalization: No - Comment as needed  Activities of Daily Living Home  Assistive Devices/Equipment: None ADL Screening (condition at time of admission) Patient's cognitive ability adequate to safely complete daily activities?: Yes Is the patient deaf or have difficulty hearing?: No Does the patient have difficulty seeing, even when wearing glasses/contacts?: No Does the patient have difficulty concentrating, remembering, or making decisions?: No Patient able to express need for assistance with ADLs?: Yes Does the patient have difficulty dressing or bathing?: No Independently performs ADLs?: Yes (appropriate for developmental age) Does the patient have difficulty walking or climbing stairs?: No Weakness of Legs: None Weakness of Arms/Hands: None  Permission Sought/Granted Permission sought to share information with : Family Supports Permission granted to share information with : Yes, Verbal Permission Granted  Share Information with NAME: [name] (and all others on facesheet)  Permission granted to share info w AGENCY: IP rehab  Permission granted to share info w Relationship: daughter  Permission granted to share info w Contact Information: [phone]  Emotional Assessment Appearance:: Appears stated age Attitude/Demeanor/Rapport: Engaged, Gracious Affect (typically observed): Accepting, Adaptable, Appropriate, Pleasant Orientation: : Oriented to Situation, Oriented to  Time, Oriented to Place, Oriented to Self Alcohol / Substance Use: Not Applicable Psych Involvement: No (comment)(unaware of any needs)  Admission diagnosis:  Right lower quadrant abdominal pain [R10.31] Acute diverticulitis [K57.92] Leukocytosis, unspecified type [D72.829] Patient Active Problem List   Diagnosis Date Noted  . Hypomagnesemia   . Acute on chronic systolic heart failure ([place])   . Respiratory failure ([place])   . Bowel obstruction ([place])   . Pre-operative cardiovascular  examination   . Acute diverticulitis 12/20/2018  . History of LV (left ventricular) mural  thrombus 09/04/2018  . History of DVT (deep vein thrombosis) 09/04/2018  . PAF (paroxysmal atrial fibrillation) (HCC) 09/04/2018  . Rash 05/06/2018  . Elevated uric acid in blood 05/06/2018  . Atypical atrial flutter (HCC) 03/17/2017  . Chronic pain of right knee 03/13/2017  . Typical atrial flutter s/p catheter ablation   . Varicose veins of legs 12/17/2015  . Encounter for health maintenance examination in adult 12/17/2015  . Special screening for malignant neoplasms, colon 12/17/2015  . Vaccine counseling 12/17/2015  . Screening for prostate cancer 12/17/2015  . Knee deformity, acquired, right 12/17/2015  . Gait disturbance 12/17/2015  . Osteoarthritis of right knee 12/17/2015  . Varicose veins of lower extremities with ulcer (HCC) 09/18/2015  . Need for prophylactic vaccination and inoculation against influenza 09/18/2015  . Tinea versicolor 06/15/2015  . Impaired fasting blood sugar 12/28/2014  . Long term current use of anticoagulant therapy 12/28/2014  . Erectile dysfunction 02/19/2012  . Chronic systolic heart failure (HCC) 12/29/2011  . Hypokalemia 12/24/2011  . Lupus anticoagulant positive 12/22/2011  . Nonischemic cardiomyopathy (HCC) 12/16/2011  . Paroxysmal SVT (supraventricular tachycardia) (HCC) 12/16/2011  . History of venous thromboembolism 12/16/2011  . Venous insufficiency 11/04/2011  . Obesity, morbid (HCC) 06/02/2008   PCP:  [name], MD Pharmacy:   [id number] - [place], [place] - [address] [place] [address] Phone: [phone] Fax: [phone]     Social Determinants of Health (SDOH) Interventions    Readmission Risk Interventions Readmission Risk Prevention Plan 01/04/2019  Transportation Screening Complete  PCP or Specialist Appt within 3-5 Days Not Complete  Not Complete comments pt not stable for dc at this time  HRI or Home Care Consult Complete   Social Work Consult for Recovery Care Planning/Counseling Complete  Palliative Care Screening Not Applicable  Medication Review Oceanographer) Referral to Pharmacy  Some recent data might be hidden

## 2019-01-24 NOTE — Progress Notes (Signed)
Physical Therapy Treatment Patient Details Name: Roberto [name] MRN: [id number] DOB: July 11, 1965 Today's Date: 01/24/2019    History of Present Illness Pt is a 54 y.o. M with history of NICM, EF 25-30%, PSVT, PAF, chronic systolic HF with AICD, DVT/PE/LV thrombus , on chronic anticoagulation who was admitted 12/20/2018 for acute diverticulitis. He was initially treated conservatively, but had no real improvement and was taken to the OR 12/28/2018.Pt was found to have a perforated cecum with leakage of stool throughout abdominal cavity. 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 He was brought from the OR to NICU intubated and on pressors. Extubated 12/16.    PT Comments    Patient received in chair, very eager and motivated to participate in therapy today. Able to complete all functional transfers and bed mobility with min guard and RW, tolerated gait progression to 35f well today with HR up to 130BPM but O2 signal unreliable due to motion degradation today, no signs of hypoxia or desat noted with gait. He was left in bed with all needs met this morning. Continues to do very well with therapy and has been able to significantly progress each session.    Follow Up Recommendations  SNF     Equipment Recommendations  Rolling walker with 5" wheels;3in1 (PT)    Recommendations for Other Services       Precautions / Restrictions Precautions Precautions: Fall;Other (comment) Precaution Comments: colostomy, JP drain R , needs abdominal binder, watch HR with gait Restrictions Weight Bearing Restrictions: No    Mobility  Bed Mobility Overal bed mobility: Needs Assistance Bed Mobility: Sit to Supine       Sit to supine: Min guard   General bed mobility comments: min guard for safety to return to bed, assist for line and drain management  Transfers Overall transfer level: Needs assistance Equipment used: Rolling walker (2 wheeled) Transfers: Sit to/from Stand Sit to Stand: Min  guard         General transfer comment: min guard and bracing of recliner/holding RW for stability due to loose brakes on chair  Ambulation/Gait Ambulation/Gait assistance: Min guard Gait Distance (Feet): 80 Feet Assistive device: Rolling walker (2 wheeled) Gait Pattern/deviations: Step-to pattern;Decreased step length - right;Decreased step length - left;Trunk flexed;Decreased stance time - right;Decreased stance time - left;Decreased stride length Gait velocity: decreased   General Gait Details: gait distance significantly progressed today, HR up to 130BPM but slow and steady with RW; SPO2 signal unreliable, No SOB or signs of hypoxia   Stairs             Wheelchair Mobility    Modified Rankin (Stroke Patients Only)       Balance Overall balance assessment: Needs assistance Sitting-balance support: No upper extremity supported;Feet supported Sitting balance-Leahy Scale: Good Sitting balance - Comments: S for safety   Standing balance support: Bilateral upper extremity supported;During functional activity Standing balance-Leahy Scale: Fair Standing balance comment: BUE support on RW                            Cognition Arousal/Alertness: Awake/alert Behavior During Therapy: WFL for tasks assessed/performed Overall Cognitive Status: Within Functional Limits for tasks assessed                                 General Comments: very motivated, pleasant and cooperative      Exercises  General Comments General comments (skin integrity, edema, etc.): HR to 130BPM with gait on room air      Pertinent Vitals/Pain Pain Assessment: No/denies pain Pain Score: 0-No pain Pain Intervention(s): Limited activity within patient's tolerance;Monitored during session    Home Living                      Prior Function            PT Goals (current goals can now be found in the care plan section) Acute Rehab PT Goals Patient  Stated Goal: to get OOB PT Goal Formulation: With patient Time For Goal Achievement: 01/31/19 Potential to Achieve Goals: Good Progress towards PT goals: Progressing toward goals    Frequency    Min 3X/week      PT Plan Current plan remains appropriate    Co-evaluation              AM-PAC PT "6 Clicks" Mobility   Outcome Measure  Help needed turning from your back to your side while in a flat bed without using bedrails?: A Little Help needed moving from lying on your back to sitting on the side of a flat bed without using bedrails?: A Little Help needed moving to and from a bed to a chair (including a wheelchair)?: A Little Help needed standing up from a chair using your arms (e.g., wheelchair or bedside chair)?: A Little Help needed to walk in hospital room?: A Little Help needed climbing 3-5 steps with a railing? : A Lot 6 Click Score: 17    End of Session Equipment Utilized During Treatment: Other (comment)(abdominal binder) Activity Tolerance: Patient tolerated treatment well Patient left: in bed;with call bell/phone within reach   PT Visit Diagnosis: Other abnormalities of gait and mobility (R26.89);Pain;Muscle weakness (generalized) (M62.81) Pain - Right/Left: Right Pain - part of body: (abdomen)     Time: 6751-9824 PT Time Calculation (min) (ACUTE ONLY): 16 min  Charges:  $Gait Training: 8-22 mins                     Windell Norfolk, DPT, PN1   Supplemental Physical Therapist [place]    Pager [phone] Acute Rehab Office [phone]

## 2019-01-24 NOTE — Progress Notes (Signed)
[facility] Progress Note  27 Days Post-Op  Subjective: Denies abdominal pain. Tolerating diet and having ileostomy output. Denies nausea.   Objective: Vital signs in last 24 hours: Temp:  [98 F (36.7 C)-98.4 F (36.9 C)] 98 F (36.7 C) (01/11 0810) Pulse Rate:  [73-107] 77 (01/11 0810) Resp:  [12-21] 19 (01/11 0810) BP: (94-104)/(60-74) 104/74 (01/11 0810) SpO2:  [97 %-99 %] 99 % (01/11 0810) Weight:  [130.1 kg] 130.1 kg (01/11 0458) Last BM Date: 01/22/19  Intake/Output from previous day: 01/10 0701 - 01/11 0700 In: 1570 [P.O.:1560; I.V.:10] Out: 1550 [Urine:975; Drains:25; Stool:550] Intake/Output this shift: No intake/output data recorded.  PE: Gen:  Alert, NAD, pleasant Pulm:  Rate and effort normal Psych: A&Ox3 Skin: warm and dry Abd: Soft, NT/ND, drain site LLQ c/d/i.RLQ drain with thick tan fluid. Midline wound dehisced without evisceration. Healthy granulation tissue at base and on sides  Lab Results:  Recent Labs    01/22/19 0421 01/24/19 0330  WBC 10.3 9.3  HGB 10.3* 10.4*  HCT 33.2* 33.1*  PLT 302 274   BMET Recent Labs    01/23/19 0550 01/24/19 0330  NA 135 137  K 4.0 4.3  CL 99 100  CO2 26 28  GLUCOSE 102* 101*  BUN 20 21*  CREATININE 1.25* 1.11  CALCIUM 8.7* 8.6*   PT/INR No results for input(s): LABPROT, INR in the last 72 hours. CMP     Component Value Date/Time   NA 137 01/24/2019 0330   NA 140 08/26/2018 1355   K 4.3 01/24/2019 0330   CL 100 01/24/2019 0330   CO2 28 01/24/2019 0330   GLUCOSE 101 (H) 01/24/2019 0330   BUN 21 (H) 01/24/2019 0330   BUN 14 08/26/2018 1355   CREATININE 1.11 01/24/2019 0330   CREATININE 1.20 10/29/2016 1236   CALCIUM 8.6 (L) 01/24/2019 0330   PROT 7.2 01/24/2019 0330   PROT 7.4 12/03/2018 1459   ALBUMIN 2.0 (L) 01/24/2019 0330   ALBUMIN 3.6 (L) 12/03/2018 1459   AST 20 01/24/2019 0330   ALT 19 01/24/2019 0330   ALKPHOS 54 01/24/2019 0330   BILITOT 0.5 01/24/2019 0330   BILITOT 0.3 12/03/2018 1459   GFRNONAA >60 01/24/2019 0330   GFRAA >60 01/24/2019 0330   Lipase     Component Value Date/Time   LIPASE 27 12/19/2018 2038       Studies/Results: No results found.  Anti-infectives: Anti-infectives (From admission, onward)   Start     Dose/Rate Route Frequency Ordered Stop   12/28/18 1400  fluconazole (DIFLUCAN) IVPB 400 mg     400 mg 100 mL/hr over 120 Minutes Intravenous Every 24 hours 12/28/18 1318 01/09/19 2359   12/20/18 2200  piperacillin-tazobactam (ZOSYN) IVPB 3.375 g     3.375 g 12.5 mL/hr over 240 Minutes Intravenous Every 8 hours 12/20/18 1834 01/09/19 2359   12/20/18 0645  piperacillin-tazobactam (ZOSYN) IVPB 3.375 g  Status:  Discontinued     3.375 g 100 mL/hr over 30 Minutes Intravenous Every 6 hours 12/20/18 0630 12/20/18 1833   12/19/18 2315  piperacillin-tazobactam (ZOSYN) IVPB 3.375 g     3.375 g 100 mL/hr over 30 Minutes Intravenous  Once 12/19/18 2302 12/20/18 0003       Assessment/Plan CHF EF 25-30% Atrial fibrillation Hx DVT/PE LV mural thrombus Gross anticoagulant Morbid obesityBMI 50.63 AKI onCKD Dehydration R elbow pain Severe malnutrition - prealbumin improving 17.8 (1/4)  Left colonic stricture of unknown etiology; with perforated cecum POD#27STC/ileostomy fordiverticular stricture of left  colon and perforated R colon12/15/2020,Dr. Emelia Loron -Pathwithout evidence of malignancy on pathology -S/p drain placement 12/22 - cxsE coli pansensitive,abx stopped 12/27.Repeat CT 1/4 w/ decrease in size of fluid collection w/ catheter in favorable position. IR rec leaving drain in place. Monitor output -Ileostomy functioning, tolerating a diet.Prealbumin improving. TPN off - wound dehisced, monitor. BID wet to dry dressing changes with adaptic in the base - abdominal binder - Surgical drain removed LLQ1/4  URK:YHCWCB and supplements JSE:GBTDVVOH>> 12/28 ID: IV Zosyn 12/6>>12/27,  IV diflucan 12/15>>12/27 Follow up: Dr. Dwain Sarna  Plan: Patient stable for discharge to SNF from surgical standpoint. Discharge instructions and follow up info on AVS.   LOS: 35 days    Wells Guiles , [facility] 01/24/2019, 9:02 AM Please see Amion for pager number during day hours 7:00am-4:30pm

## 2019-01-24 NOTE — Progress Notes (Signed)
PT Cancellation Note  Patient Details Name: TOBIN WITUCKI MRN: [id number] DOB: 01-12-1966   Cancelled Treatment:    Reason Eval/Treat Not Completed: Other (comment) ostomy bag very full and needs emptying prior to PT. He has already alerted nursing. Will attempt to return if time/schedule allow.    Madelaine Etienne, DPT, PN1   Supplemental Physical Therapist [facility]    Pager [phone] Acute Rehab Office [phone]

## 2019-01-24 NOTE — Discharge Summary (Signed)
. Physician Discharge Summary  Roberto [name] WUJ:[id number] DOB: September 27, 1965 DOA: 12/19/2018  PCP: [name], MD  Admit date: 12/19/2018 Discharge date: 01/24/2019  Admitted From: Home Disposition:  Discharged to [facility]  Recommendations for Outpatient Follow-up:  1. Follow up with PCP in 1 weeks 2. Please obtain BMP/CBC in one week  Discharge Condition: Stable  CODE STATUS: FULL   Brief/Interim Summary: Pt. With history of NICM, EF 25-30%, PSVT, PAF, chronic systolic HF with AICD, hx of DVT/PE/LV thrombus , on chronic anticoagulation, was admitted 12/20/2018 for acute diverticulitis. He was initially treated conservatively, but had no real improvement and was taken to the OR 12/28/2018. Pt had Left colonic stricture of unknown etiology, and perforated cecum. Per surgery's note there was a mass in the mid descending colon(pathology negative for malignancy). The right colon was ischemic and had perforated. There was visualization of leakage of a large amount of stool throughout his entire abdominal cavity. In OR patient became hypotensive, required pressors (Phenylephrine and Levo). Surgery felt the patient was not ready for extubation, and considering co morbidities and possible septic insult asked PCCM to admit to ICU manage care. Transferred to [facility] service on 01/03/2019 now off pressors.   Discharge Diagnoses:  Principal Problem:   Acute diverticulitis Active Problems:   Obesity, morbid (HCC)   Nonischemic cardiomyopathy (HCC)   Paroxysmal SVT (supraventricular tachycardia) (HCC)   Chronic systolic heart failure (HCC)   Chronic pain of right knee   History of DVT (deep vein thrombosis)   PAF (paroxysmal atrial fibrillation) (HCC)   Pre-operative cardiovascular examination   Bowel obstruction (HCC)   Respiratory failure (HCC)   Acute on chronic systolic heart failure (HCC)   Hypomagnesemia  Left colonic stricture of unknown etiology     - path negative for  malignancy, s/p STC/ileostomyon 12/28/2018,Dr. Emelia Loron     - he required TPN initially, now he is tolerating regular diet,      - he is cleared to discharge from surgical stand point and close follow up with general surgery (appointment made on January 29 with Dr. Dwain Sarna).     - currently he is awaiting for snf and general surgery continue to follow him while he is here.     - SNF bed available today; he is stable for discharge  perforated R colon with right lateral peritoneal fluid collection s/p right lateral abdominal drain placement in IR 01/04/2019 by IR Dr. Fredia Sorrow.     - cxsE coli pansensitive,abx (zosyn and diflucan) stopped 12/27.     - Repeat CT 1/4 w/ decrease in size of fluid collection w/ catheter in favorable position.     - IR rec leaving drain in place at discharge ,with daily flushes of 5-10 mL sterile saline. Continue to record output daily.      - follow up with IR in 1-2 qweeks  Septic shock secondary to perforated cecum and contamination of abdominal cavity with stool s/p subtotal colectomy and ileostomy-     - Was onphenylephrine, levophed + vasopressin initially,  Off pressor on 12/20     - sepsis resolved/recovered well   Acute hypoxic respiratory failure possibly secondary to postop atelectasis     - Intubated on 12/15, extubated on 12/16     - patient complained of dry coughs on 1/7,  Repeat chest x-ray on 1/7 continue to have signs of vascular congestion, right lower lung zone opacity could represent a combination of both atelectasis and a small right-sided pleural  effusion.      - There is no hypoxia, continue diuresis with good urine output,  Mucinex , incentive spirometer, out of bed     - improving, in fact now he clinically appears dry, diuretic dose decreased.on 1/10  Chronic combined systolic and diastolic CHF     - Last 2D echo LVEF 25 to 30%, Status post AICD     - he is off Entresto and spironolactone due to sepsis and hypotension,  -case discussed with  cardiology Dr. Delton See  Who recommend continue hold these meds for now, Dr Clarise Cruz to decide on when to resume entresto at hospital follow up appointment      - Continue digoxin 0.125 daily , change Demadex from 20 mg daily to 20 mg Monday Wednesday Friday on 1/10 due to clinically patient appears dry , creatinine appears to trend up      - he has an appointment with cardiology Dr Clarise Cruz on 2/4  Paroxysmal A. fib     - Currently in sinus rhythm     - continue digoxin, amiodarone, Eliquis     - Cardiology signed off, follow up appointment in AVS (2/4)     - Keep mag above 2, keep K above 4  CKDII     - SCr fluctuating , appear slightly trend up     - changed demadex from daily to mwf     - stable SCr  History of DVT/PE/LV thrombus lupus anticoagulant positive     - Continue Eliquis  Class III obesity:     - Body mass index is 45.06 kg/m.     - Recommend lifestyle modification, weight loss management     - Recommend outpatient sleep study  9 mm RULnodule was incidentally found on his chest CT in 03/2017     - He needs a repeat CT in 6-12 months, f/u with pcp to repeat ct chest, patient is aware   Liver lesions:     - Incidental findings on CT: Stable appearance of a 3.8 cm hypoattenuating lesion along the posterior right lobe liver additional subcentimeter     - hypoattenuating foci within the liver are also remains stable .     - F/u with pcp.  Stable today. Ok for discharge to SNF.   Discharge Instructions  Discharge Instructions    Discharge instructions   Complete by: As directed    Flush drain daily with 5-10 mL sterile saline. Dressing changes Q3days or PRN if soiled/wet and record output once daily. Appreciate RN providing education on flushing/drain care. You will see [place] in clinic in 7-10 days for follow up CT/possible drain injection. Schedulers will contact you with date and time of appointment.   Increase activity slowly   Complete by:  As directed      Allergies as of 01/24/2019      Reactions   Shrimp [shellfish Allergy] Hives, Itching      Medication List    TAKE these medications   acetaminophen 650 MG CR tablet Commonly known as: TYLENOL Take 1,300 mg by mouth every 8 (eight) hours as needed for pain.   amiodarone 200 MG tablet Commonly known as: PACERONE Take 1 tablet (200 mg total) by mouth daily.   coconut oil Oil Apply 1 application topically as needed (dry skin).   digoxin 0.125 MG tablet Commonly known as: LANOXIN TAKE 1 TABLET(0.125 MG) BY MOUTH DAILY What changed:   how much to take  how to take this  when to  take this  additional instructions   Eliquis 5 MG Tabs tablet Generic drug: apixaban TAKE 1 TABLET(5 MG) BY MOUTH TWICE DAILY What changed: See the new instructions.   Entresto 49-51 MG Generic drug: sacubitril-valsartan TAKE 1 TABLET BY MOUTH TWICE DAILY   potassium chloride SA 20 MEQ tablet Commonly known as: KLOR-CON Take 2 tablets (40 mEq total) by mouth 2 (two) times daily.   spironolactone 25 MG tablet Commonly known as: ALDACTONE TAKE 1 TABLET(25 MG) BY MOUTH DAILY What changed: See the new instructions.   torsemide 20 MG tablet Commonly known as: DEMADEX Take 1 tablet (20 mg total) by mouth every Monday, Wednesday, and Friday. Start taking on: January 26, 2019 What changed: See the new instructions.            Durable Medical Equipment  (From admission, onward)         Start     Ordered   01/04/19 0647  For home use only DME 3 n 1  Once     01/04/19 0646   01/04/19 0647  For home use only DME Tub bench  Once     01/04/19 1610          Contact information for follow-up providers    Emelia Loron, MD. Go on 02/11/2019.   Specialty: General Surgery Why: 1/29 at 11:10. Please arrive 30 min before your appointment. Please bring a copy of your photo ID and insurance card.  Contact information: [address] [place]  [address] [phone]        Bensimhon, Bevelyn Buckles, MD Follow up.   Specialty: Cardiology Why: Advanced Heart Failure clinic follow-up appointment as listed below on Thursday Feb 17, 2019 at 10:20 AM. The garage code for the month of February is 6008. Contact information: [address] [place] [address] [phone]        Malachy Moan, MD Follow up.   Specialties: Interventional Radiology, Radiology Why: Schedulers will contact you with date and time of appointment. Contact information: [address] [place] [address] [phone]        [name], MD Follow up.   Specialty: Family Medicine Why: hospital discharge follow up , repeat cbc/bmp at follow up, pcp to repeat chest Ct scan in 61month to follow up on 49mm RUL lung nodule.  Contact information: [address] [place] [address] [phone]        Regan Lemming, MD .   Specialty: Cardiology Contact information: [address] [place] [address] [phone]            Contact information for after-discharge care    Destination    [facility] Preferred SNF .   Service: Skilled Nursing Contact information: [address] [place] [address] [phone]                 Allergies  Allergen Reactions  . Shrimp [Shellfish Allergy] Hives and Itching    Consultations:  PCCM  Cardiology  General surgery  IR  Procedures/Studies: DG Elbow 2 Views Right  Result Date: 01/05/2019 CLINICAL DATA:  54 year old male with right elbow pain and swelling. No known injury. EXAM: RIGHT ELBOW - 2 VIEW COMPARISON:  None. FINDINGS: No obvious acute fracture. There is no dislocation. There is however joint effusion with elevation of the anterior and posterior fat pads. An occult fracture involving the radial head is not entirely excluded. Clinical correlation recommended.  The bones are well mineralized.  No significant arthritic changes. The soft tissues are grossly unremarkable. IMPRESSION: 1. No acute fracture or dislocation. 2. Joint effusion. An occult fracture is not entirely excluded. Clinical correlation recommended. Electronically Signed   By: Elgie Collard M.D.   On: 01/05/2019 21:41   DG Abd 1 View  Result Date: 01/02/2019 CLINICAL DATA:  54 year old male status post total colectomy and ileostomy. Right-sided abdominal pain. EXAM: ABDOMEN - 1 VIEW COMPARISON:  Most recent prior abdominal radiograph 12/27/2018 FINDINGS: Solitary loop of air-filled and distended small bowel present in the left hemiabdomen. The stomach is also partially filled with gas. Elevation of the right hemidiaphragm with associated right basilar atelectasis. Cardiac and mediastinal contours are within normal limits. Incompletely imaged intracardiac defibrillator lead. IMPRESSION: Dilated and air-filled loops of small bowel in the left hemiabdomen likely reflect ileus in the postoperative setting. Electronically Signed   By: Malachy Moan M.D.   On: 01/02/2019 14:09   DG Abd 1 View  Result Date: 12/27/2018 CLINICAL DATA:  Nasogastric tube placement EXAM: ABDOMEN - 1 VIEW COMPARISON:  December 22, 2018 FLUOROSCOPY TIME:  1 minutes 0 seconds period 1 acquired image FINDINGS: Nasogastric tube tip and side port in stomach. There are loops of dilated bowel. No free air evident. IMPRESSION: Nasogastric tube tip and side port in stomach. Persistent bowel dilatation. No free air demonstrable. Electronically Signed   By: Bretta Bang III M.D.   On: 12/27/2018 16:31   CT ABDOMEN PELVIS W CONTRAST  Result Date: 01/17/2019 CLINICAL DATA:  Right lateral abscess strain EXAM: CT ABDOMEN AND PELVIS WITH CONTRAST TECHNIQUE: Multidetector CT imaging of the abdomen and pelvis was performed using the standard protocol following bolus administration of intravenous contrast. CONTRAST:  OMNIPAQUE IOHEXOL 300 MG/ML  SOLN  COMPARISON:  CT abdomen pelvis 01/03/2019 FINDINGS: Lower chest: Resolved pleural effusions with some minimal residual atelectatic changes in the lung bases. Cardiac pacer/defibrillator leads terminating at the right atrium and cardiac apex. Borderline cardiomegaly is similar to prior. Hepatobiliary: Stable appearance of a 3.8 cm hypoattenuating lesion along the posterior right lobe liver additional subcentimeter hypoattenuating foci within the liver are also remains stable but are incompletely characterized on this exam. Redemonstration of a complex air and fluid containing collection on the right anterolateral aspect of the liver which extends superiorly to the gallbladder fossa with associated pericholecystic inflammation. Overall size of this collection is diminished from comparison exam measuring approximately 9.9 x 1.7 x 11 cm in maximal dimensions. Gallbladder is normally distended with few layering hyperdense gallstones similar to comparison study. Pancreas: Unremarkable. No pancreatic ductal dilatation or surrounding inflammatory changes. Spleen: Normal in size without focal abnormality. Adrenals/Urinary Tract: The adrenal glands are unremarkable. Few areas of cortical scarring are noted in the right kidney. No worrisome renal lesions or urolithiasis. The urinary bladder is mildly thickened with some gradient attenuation to the bladder contents as well as anti dependently layering hypodense material, nonspecific in appearance. Stomach/Bowel: Distal esophagus, stomach and duodenal sweep are unremarkable. Decreasing distention of the distal small bowel. Several of the small bowel loops appear closely apposed to the anterior abdominal wall at the site of the abdominal incision left to secondary intention. Postsurgical changes from subtotal colectomy with residual Hartmann's pouch. A right lower quadrant end ileostomy is noted. Vascular/Lymphatic: The aorta is normal caliber. Reactive adenopathy in the mid  mesentery. Reproductive: The prostate and seminal vesicles are unremarkable. Other: Decreasing size of the right upper quadrant air and fluid containing abscess  along the lateral margin of the liver extending in the right paracolic gutter. A pigtail catheter terminates appropriately within this collection. There are scattered areas of diffuse mesenteric edema as well as a more focal regions of phlegmon in the right lower quadrant (3/57 and 3/49) which could reflect areas of mesenteric edema or developing fat necrosis related to recent operation. Anterior abdominal incision overlying bandaging material likely reflecting a VAC dressing. A right lower quadrant end ileostomy is noted. A right upper quadrant pigtail drain is present as well. Diminishing lateral subcutaneous abdominal wall stranding and edema. Musculoskeletal: No acute osseous abnormality or suspicious osseous lesion. Partial fusion of the right SI joint, similar to prior. IMPRESSION: 1. Decreasing size of the right upper quadrant air and fluid containing abscess along the lateral margin of the liver which extends to the gallbladder fossa superiorly and into the right pericolic gutter inferiorly. A pigtail catheter drain is positioned within this collection. 2. Cholelithiasis with mild pericholecystic inflammation, favor secondary to reactive inflammation. 3. Irregular thickening of the urinary bladder with gradient density of the bladder contents and anti dependently layering hypodense material. Nonspecific though worrisome for a cystitis, could correlate with urinalysis. 4. Decreasing distention of the distal small bowel. 5. Scattered areas of diffuse mesenteric edema as well as a more focal regions of phlegmon in the right lower quadrant which could reflect areas of mesenteric edema or developing fat necrosis related to recent operation. 6. Resolved pleural effusions with some minimal residual atelectatic changes in the lung bases. 7. Diminishing body  wall edema. Electronically Signed   By: Kreg Shropshire M.D.   On: 01/17/2019 23:54   CT ABDOMEN PELVIS W CONTRAST  Result Date: 01/03/2019 CLINICAL DATA:  Six days status post subtotal colectomy and ileostomy. Rising white blood cell count. EXAM: CT ABDOMEN AND PELVIS WITH CONTRAST TECHNIQUE: Multidetector CT imaging of the abdomen and pelvis was performed using the standard protocol following bolus administration of intravenous contrast. CONTRAST:  OMNIPAQUE IOHEXOL 300 MG/ML  SOLN COMPARISON:  CT scan 12/27/2018 FINDINGS: Lower chest: Small bilateral pleural effusions with overlying atelectasis. The heart is mildly enlarged but stable. Stable pacer wires. Hepatobiliary: Stable liver lesions. No new or worrisome lesions. There is mass effect on the right lateral margin of the liver inferiorly likely due to adjacent abscess. Layering gallstones are noted the gallbladder. No common bile duct dilatation. Pancreas: No mass, inflammation or ductal dilatation. Spleen: Normal size.  No focal lesions. Adrenals/Urinary Tract: The adrenal glands and kidneys are unremarkable. Stable scarring changes involving the right renal cortex posteriorly. No hydronephrosis. The bladder is moderately distended Stomach/Bowel: The stomach, duodenum and small bowel are grossly normal. The mid distal small bowel loops are slightly dilated and there are scattered air-fluid levels but contrast is getting through the small bowel and this has more the appearance of an ileus. 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 No complicating features are identified. Subtotal colectomy. The rectum and part of the sigmoid colon are still present with a Hartmann's pouch. No complicating features. Vascular/Lymphatic: The aorta and branch vessels are patent. No atherosclerotic calcifications. Scattered mesenteric and retroperitoneal lymph nodes but no mass or overt adenopathy. Reproductive: The prostate gland and seminal vesicles are  unremarkable. Other: There is a large right-sided intra-abdominal abscess measuring 20 x 17 x 7.5 cm. This is along the lower lateral margin of the liver and extends down in right paracolic gutter to just above the iliac crest. It contains gas. Diffuse mesenteric edema but no  focal mesenteric fluid collections to suggest other abscesses. JP drainage catheter just above the bladder but no surrounding fluid collections. Musculoskeletal: No significant bony findings. IMPRESSION: 1. Large (20 x 17 x 7.5 cm) intra-abdominal abscess on the right side. 2. Mesenteric edema and a few mesenteric fluid collections but no discrete mesenteric abscess. 3. Right lower quadrant ileostomy and probable mild small-bowel ileus. 4. Small bilateral pleural effusions and bibasilar atelectasis. 5. Moderate distention of the bladder. 6. Probable cholelithiasis. These results will be called to the ordering clinician or representative by the Radiologist Assistant, and communication documented in the PACS or zVision Dashboard. Electronically Signed   By: Rudie Meyer M.D.   On: 01/03/2019 13:40   CT ABDOMEN PELVIS W CONTRAST  Result Date: 12/27/2018 CLINICAL DATA:  Inpatient. Bowel obstruction suspected. Sigmoidoscopy performed 12/23/2026 demonstrated congested mucosa with findings suggestive of acute diverticulitis, biopsy negative for malignancy. EXAM: CT ABDOMEN AND PELVIS WITH CONTRAST TECHNIQUE: Multidetector CT imaging of the abdomen and pelvis was performed using the standard protocol following bolus administration of intravenous contrast. CONTRAST:  OMNIPAQUE IOHEXOL 300 MG/ML  SOLN COMPARISON:  12/19/2018 CT abdomen/pelvis. 12/22/2018 abdominal radiograph. FINDINGS: Lower chest: No significant pulmonary nodules or acute consolidative airspace disease. Mild cardiomegaly. ICD lead tip seen at the right ventricular apex. Hepatobiliary: Normal liver size. Hypodense 4.2 cm posterior right liver mass (series 3/image 18),  decreased from 7.8 cm on 10/30/2008 CT, where it was seen to represent a hemangioma. Hypodense caudate lobe 2.5 cm liver mass (series 3/image 21), decreased from 3.0 cm on 10/30/2008 CT, compatible with a benign lesion. Several subcentimeter hypodense lesions scattered throughout the liver are too small to characterize and are unchanged in the interval. No new liver lesions. Normal gallbladder with no radiopaque cholelithiasis. No biliary ductal dilatation. Pancreas: Normal, with no mass or duct dilation. Spleen: Normal size. No mass. Adrenals/Urinary Tract: Normal adrenals. No hydronephrosis. Stable mild renal cortical scarring in the lower right kidney. No renal masses. Normal bladder. Stomach/Bowel: Small hiatal hernia. Otherwise unremarkable fluid-filled stomach without significant gastric distention. New mild diffuse small bowel dilatation to the level of the ileocecal valve, with air-fluid levels throughout the small bowel loops. Normal appendix. There is prominent dilatation of right, transverse and proximal descending colon with fluid levels throughout the dilated portions of the colon, mildly worsened since 12/19/2018 CT. Ascending colon diameter up to 10.3 cm, previously 9.3 cm. There is an approximately 5 cm in length segment of irregular annular wall thickening in the descending colon (series 6/image 115), correlating with the site of abrupt caliber transition in the colon, not appreciably changed. Mild to moderate diverticulosis is present in the sigmoid colon. There is mild pericolonic fat stranding at the site of caliber transition, unchanged. No pericolonic free air or abscess. Vascular/Lymphatic: Normal caliber abdominal aorta. Patent portal, splenic, hepatic and renal veins. No pathologically enlarged lymph nodes in the abdomen or pelvis. Reproductive: Normal size prostate. Other: No pneumoperitoneum, ascites or focal fluid collection. Stable moderate fat containing right supraumbilical ventral  abdominal hernia. Musculoskeletal: No aggressive appearing focal osseous lesions. Moderate thoracolumbar spondylosis. IMPRESSION: 1. Worsening distal large-bowel obstruction at the level of the descending colon correlating with an approximately 5 cm in length segment of irregular annular wall thickening with underlying colonic diverticulosis and mild pericolonic fat stranding. Imaging findings remain indeterminate for malignant versus diverticular descending colonic stricture. 2. New diffuse small bowel dilatation with air-fluid levels. 3. No free air or abscess. 4. Chronic findings include: Mild cardiomegaly. Small hiatal hernia.  Posterior liver masses, decreased in size since 2010, compatible with benign lesions. Electronically Signed   By: Delbert Phenix M.D.   On: 12/27/2018 09:11   DG CHEST PORT 1 VIEW  Result Date: 01/19/2019 CLINICAL DATA:  Cough. EXAM: PORTABLE CHEST 1 VIEW COMPARISON:  12/28/2018 FINDINGS: The patient has been extubated. There is a right-sided PICC line is well position. There is a left-sided AICD that is well position. The heart size is enlarged. There are bilateral pleural effusions, right greater than left. Bibasilar atelectasis is noted. There is mild vascular congestion without overt pulmonary edema. IMPRESSION: 1. Well-positioned right-sided PICC line. 2. Cardiomegaly with vascular congestion. 3. Right lower lung zone opacity favored to represent a combination of both atelectasis and a small right-sided pleural effusion. Electronically Signed   By: Katherine Mantle M.D.   On: 01/19/2019 18:58   DG CHEST PORT 1 VIEW  Result Date: 12/28/2018 CLINICAL DATA:  Respiratory failure. EXAM: PORTABLE CHEST 1 VIEW COMPARISON:  September 04, 2018. FINDINGS: Stable cardiomediastinal silhouette. Endotracheal and nasogastric tubes appear to be in grossly good position. Stable left-sided pacemaker is noted. Mild central pulmonary vascular congestion is noted. No pneumothorax or pleural effusion  is noted. Hypoinflation of the lungs is noted with mild bibasilar subsegmental atelectasis. Bony thorax is unremarkable. IMPRESSION: Stable support apparatus. Hypoinflation of the lungs with mild bibasilar subsegmental atelectasis. Stable mild central pulmonary vascular congestion is noted. Electronically Signed   By: Lupita Raider M.D.   On: 12/28/2018 14:25   DG Naso G Tube Plc W/Fl-No Rad  Result Date: 01/20/2019 There is no Radiologist interpretation  for this exam.  CT IMAGE GUIDED DRAINAGE BY PERCUTANEOUS CATHETER  Result Date: 01/04/2019 CLINICAL DATA:  Postoperative right-sided peritoneal fluid collection/abscess after colectomy for colonic perforation. EXAM: CT GUIDED CATHETER DRAINAGE OF PERITONEAL ABSCESS ANESTHESIA/SEDATION: 1.5 mg IV Versed 125 mcg IV Fentanyl Total Moderate Sedation Time:  21 minutes [name] level of consciousness and physiologic status were continuously monitored during the procedure by Radiology nursing. PROCEDURE: The procedure, risks, benefits, and alternatives were explained to the patient. Questions regarding the procedure were encouraged and answered. The patient understands and consents to the procedure. A time out was performed prior to initiating the procedure. CT was performed in a supine position through the mid to lower abdomen. The right abdominal wall was prepped with chlorhexidine in a sterile fashion, and a sterile drape was applied covering the operative field. A sterile gown and sterile gloves were used for the procedure. Local anesthesia was provided with 1% Lidocaine. An 18 gauge trocar needle was advanced into a right lateral peritoneal fluid collection. After return of fluid, a guidewire was advanced into the collection. The tract was dilated and a 12 French percutaneous drainage catheter placed. Catheter position was confirmed by CT. A fluid sample was withdrawn from the catheter and sent for culture analysis. The catheter was attached to a  suction bulb. The catheter was secured at the skin with a Prolene retention suture and StatLock device. COMPLICATIONS: None FINDINGS: Large right lateral peritoneal fluid collection again noted. Aspiration yielded thin, serosanguineous fluid. After placement of the drainage catheter, there is abundant return of fluid from the drain. IMPRESSION: CT-guided percutaneous catheter drainage of right lateral peritoneal fluid collection yielding serosanguineous fluid. A sample was sent for culture analysis. A 12 French drain was placed and attached to suction bulb drainage. Electronically Signed   By: Irish Lack M.D.   On: 01/04/2019 15:40   [place] EKG SITE RITE  Result Date: 12/28/2018 If Site Rite image not attached, placement could not be confirmed due to current cardiac rhythm.     Subjective: "I'm doing much better than the last time you saw me."  Discharge Exam: Vitals:   01/24/19 0458 01/24/19 0810  BP: 96/63 104/74  Pulse: 73 77  Resp: 14 19  Temp: 98.1 F (36.7 C) 98 F (36.7 C)  SpO2: 97% 99%   Vitals:   01/23/19 2358 01/24/19 0056 01/24/19 0458 01/24/19 0810  BP: 94/60  96/63 104/74  Pulse: (!) 107  73 77  Resp: Temp:   98.1 F (36.7 C) 98 F (36.7 C)  TempSrc:   Oral Oral  SpO2: 98%  97% 99%  Weight:   130.1 kg   Height:        General: 55 y.o. male resting in chair in NAD Cardiovascular: RRR, +S1, S2, no m/g/r Respiratory: CTABL, no w/r/r, normal WOB GI: BS+, NDNT, abdominal binder in place, ostomy MSK: No e/c/c Neuro: alert to name, follows commands Psyc: Appropriate interaction and affect, calm/cooperative   The results of significant diagnostics from this hospitalization (including imaging, microbiology, ancillary and laboratory) are listed below for reference.     Microbiology: Recent Results (from the past 240 hour(s))  SARS CORONAVIRUS 2 (TAT 6-24 HRS) Nasopharyngeal Nasopharyngeal Swab     Status: None   Collection Time: 01/21/19  4:34 AM    Specimen: Nasopharyngeal Swab  Result Value Ref Range Status   SARS Coronavirus 2 NEGATIVE NEGATIVE Final    Comment: (NOTE) SARS-CoV-2 target nucleic acids are NOT DETECTED. The SARS-CoV-2 RNA is generally detectable in upper and lower respiratory specimens during the acute phase of infection. Negative results do not preclude SARS-CoV-2 infection, do not rule out co-infections with other pathogens, and should not be used as the sole basis for treatment or other patient management decisions. Negative results must be combined with clinical observations, patient history, and epidemiological information. The expected result is Negative. Fact Sheet for Patients: HairSlick.no Fact Sheet for Healthcare Providers: quierodirigir.com This test is not yet approved or cleared by the [place] FDA and  has been authorized for detection and/or diagnosis of SARS-CoV-2 by FDA under an Emergency Use Authorization (EUA). This EUA will remain  in effect (meaning this test can be used) for the duration of the COVID-19 declaration under Section 56 4(b)(1) of the Act, 21 U.S.C. section 360bbb-3(b)(1), unless the authorization is terminated or revoked sooner. Performed at [facility], [address], [place], [place] [address]   SARS CORONAVIRUS 2 (TAT 6-24 HRS) Nasopharyngeal Nasopharyngeal Swab     Status: None   Collection Time: 01/23/19  4:05 PM   Specimen: Nasopharyngeal Swab  Result Value Ref Range Status   SARS Coronavirus 2 NEGATIVE NEGATIVE Final    Comment: (NOTE) SARS-CoV-2 target nucleic acids are NOT DETECTED. The SARS-CoV-2 RNA is generally detectable in upper and lower respiratory specimens during the acute phase of infection. Negative results do not preclude SARS-CoV-2 infection, do not rule out co-infections with other pathogens, and should not be used as the sole basis for treatment or other patient management  decisions. Negative results must be combined with clinical observations, patient history, and epidemiological information. The expected result is Negative. Fact Sheet for Patients: HairSlick.no Fact Sheet for Healthcare Providers: quierodirigir.com This test is not yet approved or cleared by the [place] FDA and  has been authorized for detection and/or diagnosis of SARS-CoV-2 by FDA under  an Emergency Use Authorization (EUA). This EUA will remain  in effect (meaning this test can be used) for the duration of the COVID-19 declaration under Section 56 4(b)(1) of the Act, 21 U.S.C. section 360bbb-3(b)(1), unless the authorization is terminated or revoked sooner. Performed at [facility], [place] [address] [place], [place] [address]      Labs: BNP (last 3 results) No results for input(s): BNP in the last 8760 hours. Basic Metabolic Panel: Recent Labs  Lab 01/20/19 0449 01/21/19 0500 01/22/19 0421 01/23/19 0550 01/24/19 0330  NA 137 132* 134* 135 137  K 3.8 3.6 3.5 4.0 4.3  CL 97* 95* 95* 99 100  CO2 27 27 28 26 28   GLUCOSE 96 114* 100* 102* 101*  BUN 19 19 18 20  21*  CREATININE 1.15 1.21 1.14 1.25* 1.11  CALCIUM 8.4* 8.3* 8.3* 8.7* 8.6*  MG 1.7 1.7  --  1.8 1.8  PHOS 4.2  --   --   --  3.9   Liver Function Tests: Recent Labs  Lab 01/20/19 0449 01/24/19 0330  AST 22 20  ALT 20 19  ALKPHOS 58 54  BILITOT 0.5 0.5  PROT 7.3 7.2  ALBUMIN 1.8* 2.0*   No results for input(s): LIPASE, AMYLASE in the last 168 hours. No results for input(s): AMMONIA in the last 168 hours. CBC: Recent Labs  Lab 01/19/19 0426 01/20/19 0449 01/21/19 0500 01/22/19 0421 01/24/19 0330  WBC 11.4* 10.7* 10.4 10.3 9.3  NEUTROABS  --   --   --   --  5.4  HGB 10.3* 10.5* 10.4* 10.3* 10.4*  HCT 33.3* 33.8* 33.3* 33.2* 33.1*  MCV 89.8 88.9 87.9 88.3 88.0  PLT 339 311 315 302 274   Cardiac Enzymes: No results for input(s):  CKTOTAL, CKMB, CKMBINDEX, TROPONINI in the last 168 hours. BNP: Invalid input(s): POCBNP CBG: Recent Labs  Lab 01/23/19 1152 01/23/19 1641 01/23/19 2121 01/24/19 0807 01/24/19 1117  GLUCAP 101* 126* 105* 101* 143*   D-Dimer No results for input(s): DDIMER in the last 72 hours. Hgb A1c No results for input(s): HGBA1C in the last 72 hours. Lipid Profile No results for input(s): CHOL, HDL, LDLCALC, TRIG, CHOLHDL, LDLDIRECT in the last 72 hours. Thyroid function studies No results for input(s): TSH, T4TOTAL, T3FREE, THYROIDAB in the last 72 hours.  Invalid input(s): FREET3 Anemia work up No results for input(s): VITAMINB12, FOLATE, FERRITIN, TIBC, IRON, RETICCTPCT in the last 72 hours. Urinalysis    Component Value Date/Time   COLORURINE YELLOW 12/19/2018 2038   APPEARANCEUR CLEAR 12/19/2018 2038   LABSPEC >1.030 (H) 12/19/2018 2038   LABSPEC 1.025 03/18/2017 1337   PHURINE 5.5 12/19/2018 2038   GLUCOSEU NEGATIVE 12/19/2018 2038   HGBUR MODERATE (A) 12/19/2018 2038   BILIRUBINUR SMALL (A) 12/19/2018 2038   BILIRUBINUR negative 03/18/2017 1337   BILIRUBINUR neg 12/17/2015 [place] 12/19/2018 2038   PROTEINUR >300 (A) 12/19/2018 2038   UROBILINOGEN negative 12/17/2015 0944   UROBILINOGEN 0.2 12/16/2011 1208   NITRITE NEGATIVE 12/19/2018 2038   LEUKOCYTESUR NEGATIVE 12/19/2018 2038   Sepsis Labs Invalid input(s): PROCALCITONIN,  WBC,  LACTICIDVEN Microbiology Recent Results (from the past 240 hour(s))  SARS CORONAVIRUS 2 (TAT 6-24 HRS) Nasopharyngeal Nasopharyngeal Swab     Status: None   Collection Time: 01/21/19  4:34 AM   Specimen: Nasopharyngeal Swab  Result Value Ref Range Status   SARS Coronavirus 2 NEGATIVE NEGATIVE Final    Comment: (NOTE) SARS-CoV-2 target nucleic acids are  NOT DETECTED. The SARS-CoV-2 RNA is generally detectable in upper and lower respiratory specimens during the acute phase of infection. Negative results do not preclude  SARS-CoV-2 infection, do not rule out co-infections with other pathogens, and should not be used as the sole basis for treatment or other patient management decisions. Negative results must be combined with clinical observations, patient history, and epidemiological information. The expected result is Negative. Fact Sheet for Patients: HairSlick.no Fact Sheet for Healthcare Providers: quierodirigir.com This test is not yet approved or cleared by the [place] FDA and  has been authorized for detection and/or diagnosis of SARS-CoV-2 by FDA under an Emergency Use Authorization (EUA). This EUA will remain  in effect (meaning this test can be used) for the duration of the COVID-19 declaration under Section 56 4(b)(1) of the Act, 21 U.S.C. section 360bbb-3(b)(1), unless the authorization is terminated or revoked sooner. Performed at [facility], [address] [place], [place] [address]   SARS CORONAVIRUS 2 (TAT 6-24 HRS) Nasopharyngeal Nasopharyngeal Swab     Status: None   Collection Time: 01/23/19  4:05 PM   Specimen: Nasopharyngeal Swab  Result Value Ref Range Status   SARS Coronavirus 2 NEGATIVE NEGATIVE Final    Comment: (NOTE) SARS-CoV-2 target nucleic acids are NOT DETECTED. The SARS-CoV-2 RNA is generally detectable in upper and lower respiratory specimens during the acute phase of infection. Negative results do not preclude SARS-CoV-2 infection, do not rule out co-infections with other pathogens, and should not be used as the sole basis for treatment or other patient management decisions. Negative results must be combined with clinical observations, patient history, and epidemiological information. The expected result is Negative. Fact Sheet for Patients: HairSlick.no Fact Sheet for Healthcare Providers: quierodirigir.com This test is not yet approved or  cleared by the [place] FDA and  has been authorized for detection and/or diagnosis of SARS-CoV-2 by FDA under an Emergency Use Authorization (EUA). This EUA will remain  in effect (meaning this test can be used) for the duration of the COVID-19 declaration under Section 56 4(b)(1) of the Act, 21 U.S.C. section 360bbb-3(b)(1), unless the authorization is terminated or revoked sooner. Performed at [facility], [address], [place], [place] [address]      Time coordinating discharge: 35 minutes  SIGNED:   Teddy Spike, DO  [facility] 01/24/2019, 11:19 AM   If 7PM-7AM, please contact night-coverage [email or web address]

## 2019-01-25 ENCOUNTER — Other Ambulatory Visit: Payer: Self-pay | Admitting: General Surgery

## 2019-01-25 DIAGNOSIS — T8143XA Infection following a procedure, organ and space surgical site, initial encounter: Secondary | ICD-10-CM

## 2019-01-28 ENCOUNTER — Ambulatory Visit (INDEPENDENT_AMBULATORY_CARE_PROVIDER_SITE_OTHER): Payer: BC Managed Care – PPO | Admitting: *Deleted

## 2019-01-28 DIAGNOSIS — I5022 Chronic systolic (congestive) heart failure: Secondary | ICD-10-CM | POA: Diagnosis not present

## 2019-01-30 LAB — CUP PACEART REMOTE DEVICE CHECK
Battery Remaining Longevity: 134 mo
Battery Voltage: 3.09 V
Brady Statistic RV Percent Paced: 0.01 %
Date Time Interrogation Session: 20210117004504
HighPow Impedance: 86 Ohm
Implantable Lead Implant Date: 20200821
Implantable Lead Location: 753860
Implantable Lead Model: 6935
Implantable Pulse Generator Implant Date: 20200821
Lead Channel Impedance Value: 342 Ohm
Lead Channel Impedance Value: 475 Ohm
Lead Channel Pacing Threshold Amplitude: 0.75 V
Lead Channel Pacing Threshold Pulse Width: 0.4 ms
Lead Channel Sensing Intrinsic Amplitude: 3.625 mV
Lead Channel Sensing Intrinsic Amplitude: 3.625 mV
Lead Channel Setting Pacing Amplitude: 2 V
Lead Channel Setting Pacing Pulse Width: 0.4 ms
Lead Channel Setting Sensing Sensitivity: 0.3 mV

## 2019-02-08 ENCOUNTER — Ambulatory Visit
Admission: RE | Admit: 2019-02-08 | Discharge: 2019-02-08 | Disposition: A | Discharge status: Home / Self Care | Payer: BC Managed Care – PPO | Source: Ambulatory Visit | Attending: General Surgery | Admitting: General Surgery

## 2019-02-08 ENCOUNTER — Encounter: Payer: Self-pay | Admitting: Radiology

## 2019-02-08 ENCOUNTER — Ambulatory Visit
Admission: RE | Admit: 2019-02-08 | Discharge: 2019-02-08 | Disposition: A | Discharge status: Home / Self Care | Payer: BC Managed Care – PPO | Source: Ambulatory Visit | Attending: Student | Admitting: Student

## 2019-02-08 DIAGNOSIS — T8143XA Infection following a procedure, organ and space surgical site, initial encounter: Secondary | ICD-10-CM

## 2019-02-08 HISTORY — PX: IR RADIOLOGIST EVAL & MGMT: IMG5224

## 2019-02-08 MED ORDER — IOPAMIDOL (ISOVUE-300) INJECTION 61%
100.0000 mL | Freq: Once | INTRAVENOUS | Status: AC | PRN
  Administered 2019-02-08: 100 mL via INTRAVENOUS

## 2019-02-08 NOTE — Progress Notes (Addendum)
Chief Complaint: Patient was seen today for routine drain follow up.   Referring Physician(s): Emelia Loron  Supervising Physician: Simonne Come  History of Present Illness: Roberto [name] is a 54 y.o. male with past medical history significant for DVT, PE, LV thrombus, cardiomyopathy, PAF, CHF s/p AICD placement and perforated diverticulitis with left colonic stricture of unknown etiology. Briefly, Roberto [name] was admitted  on 12/20/18 for acute diverticulitis which was initially treated conservatively, however he failed to improve and subsequently required ex lap with subtotal colectomy and ileostomy placement on 12/28/18 with Dr. Dwain Sarna. He developed worsening leukocytosis about a week later and CT showed a large right sided intra-abdominal abscess for which he underwent drain placement in IR on 02/03/18. He was discharged to SNF on 01/24/19 with drain in place. He presents to IR clinic today for repeat CT and drain injection.  [name] states that staff continues to flush his drain at the SNF, he is not sure how much comes out each day but states "not very much" - he thinks less than 1/4 of a suction bulb. He denies any abdominal complaints, fevers, chills and states he has been feeling well since discharge. He thinks he has an appointment to see the surgeon on 2/4.  Past Medical History:  Diagnosis Date  . AICD (automatic cardioverter/defibrillator) present 08/2018  . Arthritis   . Chronic combined systolic and diastolic CHF (congestive heart failure) (HCC)   . Deep vein thrombosis (HCC)   . Diverticulitis   . Lupus anticoagulant positive   . Morbid obesity (HCC)   . Mural thrombus of heart    coumadin  . Nonischemic cardiomyopathy (HCC)    a) 12/17/11 echo: LVEF 25-30%, grade 3 diastolic dysfunction (c/w restriction), mod MR, mod LA/LV and mild RA dilatation; b) 12/18/11 cMRI: LVEF 38%, mod LV/mild RV dilatation, global HK, mild-mod RV sys dysfxn, no LV thrombus & patchy  non-subendocardial delayed enhancement c/w infil dz or prior myocarditis; c. 07/2018 EF 25-30%.  . Paroxysmal SVT (supraventricular tachycardia) (HCC)   . 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 A-FLUTTER ABLATION N/A 11/17/2016   Procedure: A-FLUTTER ABLATION;  Surgeon: Regan Lemming, MD;  Location: [facility] INVASIVE CV LAB;  Service: Cardiovascular;  Laterality: N/A;  . BIOPSY  12/23/2018   Procedure: BIOPSY;  Surgeon: Meridee Score Netty Starring., MD;  Location: [facility] ENDOSCOPY;  Service: Gastroenterology;;  . CARDIAC CATHETERIZATION  06/2006   Angiographically normal cors  . CARDIAC CATHETERIZATION  12/22/2011   R/LHC: normal cors, well-compensated HDs, LV dysfxn  . CARDIOVERSION N/A 10/17/2016   Procedure: CARDIOVERSION;  Surgeon: Dolores Patty, MD;  Location: [facility] ENDOSCOPY;  Service: Cardiovascular;  Laterality: N/A;  . CARDIOVERSION N/A 03/20/2017   Procedure: CARDIOVERSION;  Surgeon: Dolores Patty, MD;  Location: [facility] ENDOSCOPY;  Service: Cardiovascular;  Laterality: N/A;  . COLECTOMY N/A 12/28/2018   Procedure: TOTAL COLECTOMY;  Surgeon: Emelia Loron, MD;  Location: [facility] OR;  Service: General;  Laterality: N/A;  . FLEXIBLE SIGMOIDOSCOPY N/A 12/23/2018   Procedure: FLEXIBLE SIGMOIDOSCOPY;  Surgeon: Lemar Lofty., MD;  Location: [facility] ENDOSCOPY;  Service: Gastroenterology;  Laterality: N/A;  . ICD IMPLANT N/A 09/03/2018   Procedure: ICD IMPLANT;  Surgeon: Regan Lemming, MD;  Location: [facility] INVASIVE CV LAB;  Service: Cardiovascular;  Laterality: N/A;  . ILEOSTOMY N/A 12/28/2018   Procedure: ILEOSTOMY;  Surgeon: Emelia Loron, MD;  Location:  [facility] OR;  Service: General;  Laterality: N/A;  . LEFT AND RIGHT HEART CATHETERIZATION WITH CORONARY ANGIOGRAM N/A 12/22/2011   Procedure: LEFT AND RIGHT HEART CATHETERIZATION WITH CORONARY ANGIOGRAM;  Surgeon: Dolores Patty, MD;  Location: [facility] CATH  LAB;  Service: Cardiovascular;  Laterality: N/A;  . PATELLAR TENDON REPAIR     Left  . RIGHT HEART CATH N/A 10/20/2016   Procedure: RIGHT HEART CATH;  Surgeon: Dolores Patty, MD;  Location: [facility] INVASIVE CV LAB;  Service: Cardiovascular;  Laterality: N/A;  . TEE WITHOUT CARDIOVERSION N/A 03/20/2017   Procedure: TRANSESOPHAGEAL ECHOCARDIOGRAM (TEE);  Surgeon: Dolores Patty, MD;  Location: [facility] ENDOSCOPY;  Service: Cardiovascular;  Laterality: N/A;    Allergies: Shrimp [shellfish allergy]  Medications: Prior to Admission medications   Medication Sig Start Date End Date Taking? Authorizing Provider  acetaminophen (TYLENOL) 650 MG CR tablet Take 1,300 mg by mouth every 8 (eight) hours as needed for pain.    [provider]  amiodarone (PACERONE) 200 MG tablet Take 1 tablet (200 mg total) by mouth daily. 10/18/18   Bensimhon, Bevelyn Buckles, MD  coconut oil OIL Apply 1 application topically as needed (dry skin).     [provider]  digoxin (LANOXIN) 0.125 MG tablet TAKE 1 TABLET(0.125 MG) BY MOUTH DAILY Patient taking differently: Take 0.125 mg by mouth daily.  11/01/18   Bensimhon, Bevelyn Buckles, MD  ELIQUIS 5 MG TABS tablet TAKE 1 TABLET(5 MG) BY MOUTH TWICE DAILY Patient taking differently: Take 5 mg by mouth 2 (two) times daily.  03/22/18   Bensimhon, Bevelyn Buckles, MD  ENTRESTO 49-51 MG TAKE 1 TABLET BY MOUTH TWICE DAILY Patient taking differently: Take 1 tablet by mouth 2 (two) times daily.  09/15/18   Bensimhon, Bevelyn Buckles, MD  potassium chloride SA (KLOR-CON) 20 MEQ tablet Take 2 tablets (40 mEq total) by mouth 2 (two) times daily. 10/18/18   Bensimhon, Bevelyn Buckles, MD  spironolactone (ALDACTONE) 25 MG tablet TAKE 1 TABLET(25 MG) BY MOUTH DAILY Patient taking differently: Take 25 mg by mouth daily.  12/03/18   [name], MD  torsemide (DEMADEX) 20 MG tablet Take 1 tablet (20 mg total) by mouth every Monday, Wednesday, and Friday. 01/26/19 04/26/19  Teddy Spike, DO     Family  History  Problem Relation Age of Onset  . Hypertension Mother   . Clotting disorder Mother        mom with PEs  . Cancer Mother        uterine cancer  . Diabetes Father   . Heart attack Father   . Hypertension Other        Sibling  . Diabetes Other   . Obesity Other   . Diabetes Sister   . Obesity Sister   . Obesity Sister   . Colon cancer Neg Hx   . Stomach cancer Neg Hx   . Esophageal cancer Neg Hx   . Rectal cancer Neg Hx   . Liver cancer Neg Hx     Social History   Socioeconomic History  . Marital status: Divorced    Spouse name: Not on file  . Number of children: 5  . Years of education: Not on file  . Highest education level: Not on file  Occupational History  . 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 Smoking status: Never Smoker  . Smokeless tobacco: Never Used  Substance  and Sexual Activity  . Alcohol use: No    Comment: Rarely  . Drug use: No  . Sexual activity: Not on file  Other Topics Concern  . Not on file  Social History Narrative   Lives in [place] 03/2015, 5 children.   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  Exercises with weights and some walking. 12/2015   Social Determinants of Health   Financial Resource Strain:   . Difficulty of Paying Living Expenses: Not on file  Food Insecurity:   . Worried About Programme researcher, broadcasting/film/video in the Last Year: Not on file  . Ran Out of Food in the Last Year: Not on file  Transportation Needs:   . Lack of Transportation (Medical): Not on file  . Lack of Transportation (Non-Medical): Not on file  Physical Activity:   . Days of Exercise per Week: Not on file  . Minutes of Exercise per Session: Not on file  Stress:   . Feeling of Stress : Not on file  Social Connections:   . Frequency of Communication with Friends and Family: Not on file  . Frequency of Social Gatherings with Friends and Family: Not on  file  . Attends Religious Services: Not on file  . Active Member of Clubs or Organizations: Not on file  . Attends Banker Meetings: Not on file  . Marital Status: Not on file    Review of Systems: A 12 point ROS discussed and pertinent positives are indicated in the HPI above.  All other systems are negative.  Review of Systems  Constitutional: Negative for chills and fever.  Respiratory: Negative for cough and shortness of breath.   Cardiovascular: Negative for chest pain.  Gastrointestinal: Negative for abdominal pain, nausea and vomiting.  Neurological: Negative for dizziness.    Vital Signs: There were no vitals taken for this visit.  Physical Exam Constitutional:      General: He is not in acute distress.    Appearance: He is obese.  HENT:     Head: Normocephalic.  Pulmonary:     Effort: Pulmonary effort is normal.  Abdominal:     Palpations: Abdomen is soft.     Comments: (+) ileostomy present (+) midline wound present with dressing in place (+) right lateral 12 Fr IR drain to suction bulb with ~20 cc thin, brown output. Suture and stat lock in tact, no leakage or bleeding noted.  Skin:    General: Skin is warm and dry.  Neurological:     Mental Status: He is alert. Mental status is at baseline.  Psychiatric:        Mood and Affect: Mood normal.        Behavior: Behavior normal.        Thought Content: Thought content normal.        Judgment: Judgment normal.     Imaging: CT ABDOMEN PELVIS W CONTRAST  Result Date: 01/17/2019 CLINICAL DATA:  Right lateral abscess strain EXAM: CT ABDOMEN AND PELVIS WITH CONTRAST TECHNIQUE: Multidetector CT imaging of the abdomen and pelvis was performed using the standard protocol following bolus administration of intravenous contrast. CONTRAST:  OMNIPAQUE IOHEXOL 300 MG/ML  SOLN COMPARISON:  CT abdomen pelvis 01/03/2019 FINDINGS: Lower chest: Resolved pleural effusions with some minimal residual atelectatic  changes in the lung bases. Cardiac pacer/defibrillator leads terminating at the right atrium and cardiac apex. Borderline cardiomegaly is similar to prior. Hepatobiliary: Stable appearance of a 3.8 cm  hypoattenuating lesion along the posterior right lobe liver additional subcentimeter hypoattenuating foci within the liver are also remains stable but are incompletely characterized on this exam. Redemonstration of a complex air and fluid containing collection on the right anterolateral aspect of the liver which extends superiorly to the gallbladder fossa with associated pericholecystic inflammation. Overall size of this collection is diminished from comparison exam measuring approximately 9.9 x 1.7 x 11 cm in maximal dimensions. Gallbladder is normally distended with few layering hyperdense gallstones similar to comparison study. Pancreas: Unremarkable. No pancreatic ductal dilatation or surrounding inflammatory changes. Spleen: Normal in size without focal abnormality. Adrenals/Urinary Tract: The adrenal glands are unremarkable. Few areas of cortical scarring are noted in the right kidney. No worrisome renal lesions or urolithiasis. The urinary bladder is mildly thickened with some gradient attenuation to the bladder contents as well as anti dependently layering hypodense material, nonspecific in appearance. Stomach/Bowel: Distal esophagus, stomach and duodenal sweep are unremarkable. Decreasing distention of the distal small bowel. Several of the small bowel loops appear closely apposed to the anterior abdominal wall at the site of the abdominal incision left to secondary intention. Postsurgical changes from subtotal colectomy with residual Hartmann's pouch. A right lower quadrant end ileostomy is noted. Vascular/Lymphatic: The aorta is normal caliber. Reactive adenopathy in the mid mesentery. Reproductive: The prostate and seminal vesicles are unremarkable. Other: Decreasing size of the right upper quadrant air and  fluid containing abscess along the lateral margin of the liver extending in the right paracolic gutter. A pigtail catheter terminates appropriately within this collection. There are scattered areas of diffuse mesenteric edema as well as a more focal regions of phlegmon in the right lower quadrant (3/57 and 3/49) which could reflect areas of mesenteric edema or developing fat necrosis related to recent operation. Anterior abdominal incision overlying bandaging material likely reflecting a VAC dressing. A right lower quadrant end ileostomy is noted. A right upper quadrant pigtail drain is present as well. Diminishing lateral subcutaneous abdominal wall stranding and edema. Musculoskeletal: No acute osseous abnormality or suspicious osseous lesion. Partial fusion of the right SI joint, similar to prior. IMPRESSION: 1. Decreasing size of the right upper quadrant air and fluid containing abscess along the lateral margin of the liver which extends to the gallbladder fossa superiorly and into the right pericolic gutter inferiorly. A pigtail catheter drain is positioned within this collection. 2. Cholelithiasis with mild pericholecystic inflammation, favor secondary to reactive inflammation. 3. Irregular thickening of the urinary bladder with gradient density of the bladder contents and anti dependently layering hypodense material. Nonspecific though worrisome for a cystitis, could correlate with urinalysis. 4. Decreasing distention of the distal small bowel. 5. Scattered areas of diffuse mesenteric edema as well as a more focal regions of phlegmon in the right lower quadrant which could reflect areas of mesenteric edema or developing fat necrosis related to recent operation. 6. Resolved pleural effusions with some minimal residual atelectatic changes in the lung bases. 7. Diminishing body wall edema. Electronically Signed   By: Lovena Le M.D.   On: 01/17/2019 23:54   DG CHEST PORT 1 VIEW  Result Date:  01/19/2019 CLINICAL DATA:  Cough. EXAM: PORTABLE CHEST 1 VIEW COMPARISON:  12/28/2018 FINDINGS: The patient has been extubated. There is a right-sided PICC line is well position. There is a left-sided AICD that is well position. The heart size is enlarged. There are bilateral pleural effusions, right greater than left. Bibasilar atelectasis is noted. There is mild vascular congestion without overt pulmonary edema.  IMPRESSION: 1. Well-positioned right-sided PICC line. 2. Cardiomegaly with vascular congestion. 3. Right lower lung zone opacity favored to represent a combination of both atelectasis and a small right-sided pleural effusion. Electronically Signed   By: Katherine Mantle M.D.   On: 01/19/2019 18:58   CUP PACEART REMOTE DEVICE CHECK  Result Date: 01/30/2019 Scheduled remote reviewed.  Normal device function.  Next remote 91 days. Hassell Halim, RN, CCDS, [employer]   Labs:  CBC: Recent Labs    01/20/19 0449 01/21/19 0500 01/22/19 0421 01/24/19 0330  WBC 10.7* 10.4 10.3 9.3  HGB 10.5* 10.4* 10.3* 10.4*  HCT 33.8* 33.3* 33.2* 33.1*  PLT 311 315 302 274    COAGS: Recent Labs    12/28/18 0447 12/28/18 1410 12/29/18 0500 12/30/18 0447 12/30/18 1639  INR  --  1.5*  --   --   --   APTT 106*  --  29 32 78*    BMP: Recent Labs    01/21/19 0500 01/22/19 0421 01/23/19 0550 01/24/19 0330  NA 132* 134* 135 137  K 3.6 3.5 4.0 4.3  CL 95* 95* 99 100  CO2 27 28 26 28   GLUCOSE 114* 100* 102* 101*  BUN 19 18 20  21*  CALCIUM 8.3* 8.3* 8.7* 8.6*  CREATININE 1.21 1.14 1.25* 1.11  GFRNONAA >60 >60 >60 >60  GFRAA >60 >60 >60 >60    LIVER FUNCTION TESTS: Recent Labs    01/13/19 0500 01/17/19 0616 01/20/19 0449 01/24/19 0330  BILITOT 0.5 0.3 0.5 0.5  AST 23 18 22 20   ALT 18 17 20 19   ALKPHOS 65 59 58 54  PROT 6.3* 6.9 7.3 7.2  ALBUMIN 1.4* 1.5* 1.8* 2.0*    TUMOR MARKERS: No results for input(s): AFPTM, CEA, CA199, CHROMGRNA in the last 8760  hours.  Assessment:  54 y/o M s/p right lateral abdomen drain placement 01/04/19 in IR who was seen today in IR clinic for routine drain follow up.  CT A/P with contrast performed today which shows interval reduction/resolution of air and fluid containing collection with drain in appropriate position. Drain injection today confirms resolution of previous fluid collection.   Given resolution of previous fluid collection the right lateral drain was removed without complication today.   Patient was encouraged to maintain follow up with [facility] and all other providers.   Electronically Signed: PA-C 02/08/2019, 2:34 PM   Please refer to Dr. 40' attestation of this note for management and plan.

## 2019-02-17 ENCOUNTER — Other Ambulatory Visit: Payer: Self-pay

## 2019-02-17 ENCOUNTER — Encounter (HOSPITAL_COMMUNITY): Payer: Self-pay | Admitting: Internal Medicine

## 2019-02-17 ENCOUNTER — Ambulatory Visit (HOSPITAL_COMMUNITY)
Admit: 2019-02-17 | Discharge: 2019-02-17 | Disposition: A | Discharge status: Home / Self Care | Payer: BC Managed Care – PPO | Source: Ambulatory Visit | Attending: Internal Medicine | Admitting: Internal Medicine

## 2019-02-17 ENCOUNTER — Encounter: Payer: Self-pay | Admitting: *Deleted

## 2019-02-17 VITALS — BP 89/54 | HR 86 | Wt 287.8 lb

## 2019-02-17 DIAGNOSIS — I429 Cardiomyopathy, unspecified: Secondary | ICD-10-CM | POA: Insufficient documentation

## 2019-02-17 DIAGNOSIS — I440 Atrioventricular block, first degree: Secondary | ICD-10-CM | POA: Diagnosis not present

## 2019-02-17 DIAGNOSIS — I5042 Chronic combined systolic (congestive) and diastolic (congestive) heart failure: Secondary | ICD-10-CM | POA: Diagnosis not present

## 2019-02-17 DIAGNOSIS — M109 Gout, unspecified: Secondary | ICD-10-CM | POA: Diagnosis not present

## 2019-02-17 DIAGNOSIS — Z86711 Personal history of pulmonary embolism: Secondary | ICD-10-CM | POA: Insufficient documentation

## 2019-02-17 DIAGNOSIS — Z79899 Other long term (current) drug therapy: Secondary | ICD-10-CM | POA: Diagnosis not present

## 2019-02-17 DIAGNOSIS — R531 Weakness: Secondary | ICD-10-CM | POA: Insufficient documentation

## 2019-02-17 DIAGNOSIS — Z95810 Presence of automatic (implantable) cardiac defibrillator: Secondary | ICD-10-CM | POA: Diagnosis not present

## 2019-02-17 DIAGNOSIS — Z7901 Long term (current) use of anticoagulants: Secondary | ICD-10-CM | POA: Diagnosis not present

## 2019-02-17 DIAGNOSIS — I5022 Chronic systolic (congestive) heart failure: Secondary | ICD-10-CM | POA: Diagnosis not present

## 2019-02-17 DIAGNOSIS — L03115 Cellulitis of right lower limb: Secondary | ICD-10-CM | POA: Diagnosis not present

## 2019-02-17 DIAGNOSIS — I872 Venous insufficiency (chronic) (peripheral): Secondary | ICD-10-CM | POA: Insufficient documentation

## 2019-02-17 DIAGNOSIS — M1990 Unspecified osteoarthritis, unspecified site: Secondary | ICD-10-CM | POA: Diagnosis not present

## 2019-02-17 DIAGNOSIS — Z86718 Personal history of other venous thrombosis and embolism: Secondary | ICD-10-CM | POA: Diagnosis not present

## 2019-02-17 DIAGNOSIS — K5792 Diverticulitis of intestine, part unspecified, without perforation or abscess without bleeding: Secondary | ICD-10-CM | POA: Diagnosis not present

## 2019-02-17 DIAGNOSIS — I480 Paroxysmal atrial fibrillation: Secondary | ICD-10-CM | POA: Diagnosis not present

## 2019-02-17 LAB — BASIC METABOLIC PANEL
Anion gap: 9 (ref 5–15)
BUN: 28 mg/dL — ABNORMAL HIGH (ref 6–20)
CO2: 19 mmol/L — ABNORMAL LOW (ref 22–32)
Calcium: 8.6 mg/dL — ABNORMAL LOW (ref 8.9–10.3)
Chloride: 111 mmol/L (ref 98–111)
Creatinine, Ser: 1.76 mg/dL — ABNORMAL HIGH (ref 0.61–1.24)
GFR calc Af Amer: 50 mL/min — ABNORMAL LOW (ref >60)
GFR calc non Af Amer: 43 mL/min — ABNORMAL LOW (ref >60)
Glucose, Bld: 110 mg/dL — ABNORMAL HIGH (ref 70–99)
Potassium: 5 mmol/L (ref 3.5–5.1)
Sodium: 139 mmol/L (ref 135–145)

## 2019-02-17 LAB — CBC
HCT: 40.6 % (ref 39.0–52.0)
Hemoglobin: 12.5 g/dL — ABNORMAL LOW (ref 13.0–17.0)
MCH: 27.7 pg (ref 26.0–34.0)
MCHC: 30.8 g/dL (ref 30.0–36.0)
MCV: 90 fL (ref 80.0–100.0)
Platelets: 215 10*3/uL (ref 150–400)
RBC: 4.51 MIL/uL (ref 4.22–5.81)
RDW: 15.3 % (ref 11.5–15.5)
WBC: 8.9 10*3/uL (ref 4.0–10.5)
nRBC: 0 % (ref 0.0–0.2)

## 2019-02-17 MED ORDER — SACUBITRIL-VALSARTAN 24-26 MG PO TABS: 1.0000 | ORAL_TABLET | Freq: Two times a day (BID) | ORAL | 5 refills | Status: DC

## 2019-02-17 MED ORDER — POTASSIUM CHLORIDE CRYS ER 20 MEQ PO TBCR: 20.0000 meq | EXTENDED_RELEASE_TABLET | Freq: Every day | ORAL | 5 refills | Status: DC

## 2019-02-17 MED ORDER — TORSEMIDE 20 MG PO TABS: 20.0000 mg | ORAL_TABLET | Freq: Every day | ORAL | 5 refills | Status: DC

## 2019-02-17 NOTE — Patient Instructions (Signed)
DECREASE Potassium to (1 tab) daily   DECREASE Entresto to 24/26mg  (1 tab) twice a day   DECREASE Torsemide to 20mg  (1 tab) daily. Take an extra tab as needed for SOB or swelling   CONTINUE taking Spironolactone 12.5mg  (1/2 tab) daily   Labs today We will only contact you if something comes back abnormal or we need to make some changes. Otherwise no news is good news!   Your physician recommends that you schedule a follow-up appointment in: 6 weeks with the Nurse Practitioner? .   Please call office at [phone] option [phone] if you have any questions or concerns.   At the [facility], you and your health needs are our priority. As part of our continuing mission to provide you with exceptional heart care, we have created designated Provider Care Teams. These Care Teams include your primary Cardiologist (physician) and Advanced Practice Providers (APPs- Physician Assistants and Nurse Practitioners) who all work together to provide you with the care you need, when you need it.   You may see any of the following providers on your designated Care Team at your next follow up: [phone] Dr [name] . Dr Arvilla Meres . Marca Ancona, NP . Tonye Becket, PA . Robbie Lis, PharmD   Please be sure to bring in all your medications bottles to every appointment.

## 2019-02-17 NOTE — Progress Notes (Signed)
[facility] Note   Patient ID: Roberto [name], male   DOB: 1965/08/03, 54 y.o.   MRN: [id number] PCP: Dr Laverta [place] Cardiologist: Dr Stanford Breed EP: Dr Rayann Heman Coumadin followed by PCP  HPI: Roberto [name] is a 54 y.o. male  with h/o systolic HF due to NICM (JJ88-41% 2013 ), h/o recurrent DVT with resultant PE and chronic venous insufficiency, h/o LV thrombus (on Coumadin), PSVT (elected for medical therapy over RFA on EP follow-up) and morbid obesity.   Admitted to [facility] 12/16/11 for recurrent HF. Diuresed 20 pounds with IV lasix. See RHC/LHC results   Admitted 10/14/16-10/21/16 with AF and tachy induced cardiomyopathy. EF dropped from 40%-> 15%. Underwent DCCV on 10/17/16 with successful conversion to NSR. Started on amiodarone. Diuresed 12 pounds with IV lasix. Discharge weight was 279 pounds.  S/p AFL ablation 11/17/2016  Admitted 3/8 - 03/26/17. Pt underwent outpatient DCCV 03/20/17, then came back that evening with worsening SOB and CP. Diuresed with IV lasix. Also treated for cellulitis of his RLE, and gout. Discharge weight 280 lbs.  Echo 7/20: EF 25-30%   Admitted 12/19/18- 01/24/19 for acute diverticulits. Failed IV abx. Underwent R colectomy with ileostomy.  In OR patient became hypotensive, required pressors (Phenylephrine and Levo).Course c/b perforated R colon with right lateral peritoneal fluid collection s/p right lateral abdominal drain placement in IR 01/04/2019 by IR Dr. Kathlene Cote. HF meds stopped during hospital but restarted.   He presents today for post-hospital f/u. Doing ok weak. But able to get around and do ADLs. No CP, SOB, edema, orthopnea or PND. Ab wound healing by secondary intention.   Cardiac studies:  Echo 07/2017 EF appears slightly improved to 20-25%. Possible clot (vs artifact in IVC/RA junction) Personally reviewed  EKG 07/2017 Sinus rhythm 63 with 1st degree AV block.    Echo 10/15/16 - LVEF 15% TEE 03/20/17 LVEF 15-20%, Severe reduced RV function  RHC  10/20/16  RA = 3 RV = 45/7 PA = 49/20 (31) PCW = 28 Fick cardiac output/index = 5.0/2.1 PVR = 0.5 WU Ao sat = 95% PA sat = 64%, 61%  12/17/11 ECHO 25-30%  05/20/2012 ECHO EF 40-45%  CARDIAC MRI - 12/18/11  1. Moderately dilated left ventricle with moderately decreased systolic function, EF 66%. Global hypokinesis.  2. Mildly dilated right ventricle with mild to moderately decreased systolic function.  3. No definite LV thrombus noted.  4. Patchy non-subendocardial delayed enhancement seen in the ventricular septum (see above for description). This is not suggestive of a coronary disease pattern. This could be suggestive of infiltrative disease versus prior myocarditis.  RHC/LHC 12/22/11  Cors: Normal  SH: Lives with his wife and 5 children. Works full time running a Daycare  Review of systems complete and found to be negative unless listed in HPI.    Past Medical History:  Diagnosis Date  . AICD (automatic cardioverter/defibrillator) present 08/2018  . Arthritis   . Chronic combined systolic and diastolic CHF (congestive heart failure) ([place])   . Deep vein thrombosis ([place])   . Diverticulitis   . Lupus anticoagulant positive   . Morbid obesity ([place])   . Mural thrombus of heart    coumadin  . Nonischemic cardiomyopathy ([place])    a) 12/17/11 echo: LVEF 06-30%, grade 3 diastolic dysfunction (c/w restriction), mod MR, mod LA/LV and mild RA dilatation; b) 12/18/11 cMRI: LVEF 38%, mod LV/mild RV dilatation, global HK, mild-mod RV sys dysfxn, no LV thrombus & patchy non-subendocardial delayed enhancement c/w infil dz or  prior myocarditis; c. 07/2018 EF 25-30%.  . Paroxysmal SVT (supraventricular tachycardia) (HCC)   . Pulmonary embolism (HCC)    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    Current Outpatient Medications  Medication Sig Dispense Refill  . acetaminophen (TYLENOL) 650 MG CR tablet Take 1,300 mg by mouth every 8 (eight) hours as needed for pain.    [name] amiodarone (PACERONE) 200 MG  tablet Take 1 tablet (200 mg total) by mouth daily. 30 tablet 3  . apixaban (ELIQUIS) 5 MG TABS tablet Take 5 mg by mouth 2 (two) times daily.    . coconut oil OIL Apply 1 application topically as needed (dry skin).     [name] digoxin (LANOXIN) 0.125 MG tablet Take 0.125 mg by mouth daily.    . potassium chloride SA (KLOR-CON) 20 MEQ tablet Take 2 tablets (40 mEq total) by mouth 2 (two) times daily. 120 tablet 3  . sacubitril-valsartan (ENTRESTO) 49-51 MG Take 1 tablet by mouth 2 (two) times daily.    [name] spironolactone (ALDACTONE) 25 MG tablet Take 25 mg by mouth daily.    [name] torsemide (DEMADEX) 20 MG tablet Take 40 mg by mouth 2 (two) times daily.     No current facility-administered medications for this encounter.   PHYSICAL EXAM: Vitals:   02/17/19 1100  BP: (!) 89/54  Pulse: 86  SpO2: 98%  Weight: 130.5 kg (287 lb 12.8 oz)   Wt Readings from Last 3 Encounters:  02/17/19 130.5 kg (287 lb 12.8 oz)  01/24/19 130.1 kg (286 lb 13.1 oz)  12/19/18 (!) 151 kg (333 lb)    Physical Exam General:  Obese male. Weak appearing. No resp difficulty HEENT: normal Neck: supple. no JVD. Carotids 2+ bilat; no bruits. No lymphadenopathy or thryomegaly appreciated. Cor: PMI nondisplaced. Regular rate & rhythm. No rubs, gallops or murmurs. Lungs: clear Abdomen: obese. Ileostomy site ok. Dressing c/d/i. soft, nontender, nondistended. No hepatosplenomegaly. No bruits or masses. Good bowel sounds. Extremities: no cyanosis, clubbing, rash, edema Neuro: alert & orientedx3, cranial nerves grossly intact. moves all 4 extremities w/o difficulty. Affect pleasant   ASSESSMENT & PLAN:  1. Chronic Systolic Heart Failure: - Echo 10/15/16 - LVEF 15% - TEE 03/2017 LVEF 15-20% - Echo 07/2017 with LVEF 20-25%. - Echo 7/20 EF 25-30% - NYHA II-III symptoms in setting of prolonged hospitalization for abdominal abscess/colectomy.  - Volume status stable/low on exam.   - Decrease torsemide 40 mg BID -> 20mg  daily. Can take  extra as needed.  - Continue spiro 12.5 mg daily - Continue digoxin 0.125 mg daily. - Continue carvedilol 3.125 bid - Decrease Entresto to 24/26 mg BID.  - Eventually needs to see EP for ICD consideration once infection cleared - Check labs 2. 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 - Maintaining NSR - Continue amiodarone 200 mg daily.  - Continue Eliquis 5 mg BID. No bleeding   13/5/18, MD  11:40 AM

## 2019-02-23 ENCOUNTER — Telehealth (HOSPITAL_COMMUNITY): Payer: Self-pay

## 2019-02-23 NOTE — Telephone Encounter (Signed)
-----   Message from Dolores Patty, MD sent at 02/17/2019  4:01 PM EST ----- Diuretics and potassium cut back today. Please have HHRN repeat next week.

## 2019-02-23 NOTE — Telephone Encounter (Signed)
Pt aware of lab results. Pt has [facility] RN coming today.  [facility] to draw labs.  Rx for labs faxed to their office. Advised to fax results to our office when complete.

## 2019-03-14 ENCOUNTER — Telehealth: Payer: Self-pay | Admitting: *Deleted

## 2019-03-14 NOTE — Telephone Encounter (Signed)
03/12/2019 TOC CM received call from pt's dtr, Earney Hamburg, states pt has ran out of his colostomy bags. States he is active with [facility] for [facility]. And that they did discuss with [facility] but they have not been assisting with him getting colostomy supplies.  Isidoro Donning RN CCM  Transitions of Care Clinical Case Manager Care Coordination Department Ph: [phone]     03/14/2019 3:07 TOC CM contacted pt's and dtr and left message for return call. Contacted [facility] rep, Training and development officer. States she will have his [facility] RN follow today. He is scheduled a visit today. States the [facility] agency sets pt with [place] and they are suppose to go in request supplies. States she will check to see if office has colostomy bags. Isidoro Donning RN CCM, [facility] ED TOC CM [phone]

## 2019-03-24 ENCOUNTER — Ambulatory Visit: Payer: BC Managed Care – PPO | Attending: Family

## 2019-03-24 DIAGNOSIS — Z23 Encounter for immunization: Secondary | ICD-10-CM

## 2019-03-24 NOTE — Progress Notes (Signed)
   Covid-19 Vaccination Clinic  Name:  Roberto [name]    MRN: [id number] DOB: 12/26/1965  03/24/2019  [name] was observed post Covid-19 immunization for 15 minutes without incident. He was provided with Vaccine Information Sheet and instruction to access the V-Safe system.   [name] was instructed to call 911 with any severe reactions post vaccine: [name] Difficulty breathing  . Swelling of face and throat  . A fast heartbeat  . A bad rash all over body  . Dizziness and weakness   Immunizations Administered    Name Date Dose VIS Date Route   Moderna COVID-19 Vaccine 03/24/2019 10:47 AM 0.5 mL 12/14/2018 Intramuscular   Manufacturer: Moderna   Lot: [id number]   NDC: 75300-511-02

## 2019-03-31 ENCOUNTER — Ambulatory Visit (HOSPITAL_COMMUNITY)
Admission: RE | Admit: 2019-03-31 | Discharge: 2019-03-31 | Disposition: A | Discharge status: Home / Self Care | Payer: BC Managed Care – PPO | Source: Ambulatory Visit | Attending: Cardiology | Admitting: Cardiology

## 2019-03-31 ENCOUNTER — Encounter (HOSPITAL_COMMUNITY): Payer: Self-pay

## 2019-03-31 ENCOUNTER — Other Ambulatory Visit: Payer: Self-pay

## 2019-03-31 VITALS — BP 136/92 | HR 79 | Wt 312.0 lb

## 2019-03-31 DIAGNOSIS — Z6841 Body Mass Index (BMI) 40.0 and over, adult: Secondary | ICD-10-CM | POA: Insufficient documentation

## 2019-03-31 DIAGNOSIS — I428 Other cardiomyopathies: Secondary | ICD-10-CM | POA: Diagnosis not present

## 2019-03-31 DIAGNOSIS — Z79899 Other long term (current) drug therapy: Secondary | ICD-10-CM | POA: Diagnosis not present

## 2019-03-31 DIAGNOSIS — I444 Left anterior fascicular block: Secondary | ICD-10-CM | POA: Insufficient documentation

## 2019-03-31 DIAGNOSIS — Z86718 Personal history of other venous thrombosis and embolism: Secondary | ICD-10-CM | POA: Diagnosis not present

## 2019-03-31 DIAGNOSIS — Z932 Ileostomy status: Secondary | ICD-10-CM | POA: Diagnosis not present

## 2019-03-31 DIAGNOSIS — Z9049 Acquired absence of other specified parts of digestive tract: Secondary | ICD-10-CM | POA: Insufficient documentation

## 2019-03-31 DIAGNOSIS — M1990 Unspecified osteoarthritis, unspecified site: Secondary | ICD-10-CM | POA: Diagnosis not present

## 2019-03-31 DIAGNOSIS — Z95810 Presence of automatic (implantable) cardiac defibrillator: Secondary | ICD-10-CM | POA: Insufficient documentation

## 2019-03-31 DIAGNOSIS — Z86711 Personal history of pulmonary embolism: Secondary | ICD-10-CM | POA: Insufficient documentation

## 2019-03-31 DIAGNOSIS — I5022 Chronic systolic (congestive) heart failure: Secondary | ICD-10-CM

## 2019-03-31 DIAGNOSIS — Z7901 Long term (current) use of anticoagulants: Secondary | ICD-10-CM | POA: Diagnosis not present

## 2019-03-31 DIAGNOSIS — I4892 Unspecified atrial flutter: Secondary | ICD-10-CM | POA: Insufficient documentation

## 2019-03-31 DIAGNOSIS — Z9889 Other specified postprocedural states: Secondary | ICD-10-CM | POA: Insufficient documentation

## 2019-03-31 DIAGNOSIS — I872 Venous insufficiency (chronic) (peripheral): Secondary | ICD-10-CM | POA: Insufficient documentation

## 2019-03-31 DIAGNOSIS — I5042 Chronic combined systolic (congestive) and diastolic (congestive) heart failure: Secondary | ICD-10-CM | POA: Diagnosis not present

## 2019-03-31 LAB — CBC
HCT: 38.2 % — ABNORMAL LOW (ref 39.0–52.0)
Hemoglobin: 11.8 g/dL — ABNORMAL LOW (ref 13.0–17.0)
MCH: 28 pg (ref 26.0–34.0)
MCHC: 30.9 g/dL (ref 30.0–36.0)
MCV: 90.7 fL (ref 80.0–100.0)
Platelets: 197 10*3/uL (ref 150–400)
RBC: 4.21 MIL/uL — ABNORMAL LOW (ref 4.22–5.81)
RDW: 16.4 % — ABNORMAL HIGH (ref 11.5–15.5)
WBC: 8.3 10*3/uL (ref 4.0–10.5)
nRBC: 0 % (ref 0.0–0.2)

## 2019-03-31 LAB — COMPREHENSIVE METABOLIC PANEL
ALT: 14 U/L (ref 0–44)
AST: 17 U/L (ref 15–41)
Albumin: 2.9 g/dL — ABNORMAL LOW (ref 3.5–5.0)
Alkaline Phosphatase: 32 U/L — ABNORMAL LOW (ref 38–126)
Anion gap: 11 (ref 5–15)
BUN: 14 mg/dL (ref 6–20)
CO2: 21 mmol/L — ABNORMAL LOW (ref 22–32)
Calcium: 8.7 mg/dL — ABNORMAL LOW (ref 8.9–10.3)
Chloride: 105 mmol/L (ref 98–111)
Creatinine, Ser: 1.11 mg/dL (ref 0.61–1.24)
GFR calc Af Amer: >60 mL/min (ref >60)
GFR calc non Af Amer: >60 mL/min (ref >60)
Glucose, Bld: 103 mg/dL — ABNORMAL HIGH (ref 70–99)
Potassium: 4.3 mmol/L (ref 3.5–5.1)
Sodium: 137 mmol/L (ref 135–145)
Total Bilirubin: 0.7 mg/dL (ref 0.3–1.2)
Total Protein: 6.9 g/dL (ref 6.5–8.1)

## 2019-03-31 LAB — T4, FREE: Free T4: 1.27 ng/dL — ABNORMAL HIGH (ref 0.61–1.12)

## 2019-03-31 LAB — TSH: TSH: 0.372 u[IU]/mL (ref 0.350–4.500)

## 2019-03-31 LAB — BRAIN NATRIURETIC PEPTIDE: B Natriuretic Peptide: 323.5 pg/mL — ABNORMAL HIGH (ref 0.0–100.0)

## 2019-03-31 MED ORDER — TORSEMIDE 20 MG PO TABS: 20.0000 mg | ORAL_TABLET | Freq: Two times a day (BID) | ORAL | 5 refills | Status: DC

## 2019-03-31 NOTE — Progress Notes (Signed)
Advanced Heart Failure Clinic Note   Patient ID: Roberto [name], male   DOB: 1965-12-03, 54 y.o.   MRN: [id number] PCP: Dr Laverta [place] Cardiologist: Dr Stanford Breed EP: Dr Rayann Heman AHF: Dr. Haroldine Laws   HPI: Roberto [name] is a 54 y.o. male  with h/o systolic HF due to NICM (WC37-62% 2013 ), h/o recurrent DVT with resultant PE and chronic venous insufficiency, h/o LV thrombus (on Coumadin), PSVT (elected for medical therapy over RFA on EP follow-up) and morbid obesity.   Admitted to [facility] 12/16/11 for recurrent HF. Diuresed 20 pounds with IV lasix. See RHC/LHC results   Admitted 10/14/16-10/21/16 with AF and tachy induced cardiomyopathy. EF dropped from 40%-> 15%. Underwent DCCV on 10/17/16 with successful conversion to NSR. Started on amiodarone. Diuresed 12 pounds with IV lasix. Discharge weight was 279 pounds.  S/p AFL ablation 11/17/2016  Admitted 3/8 - 03/26/17. Pt underwent outpatient DCCV 03/20/17, then came back that evening with worsening SOB and CP. Diuresed with IV lasix. Also treated for cellulitis of his RLE, and gout. Discharge weight 280 lbs.  Echo 7/20: EF 25-30%   Admitted 12/19/18- 01/24/19 for acute diverticulits. Failed IV abx. Underwent R colectomy with ileostomy.  In OR patient became hypotensive, required pressors (Phenylephrine and Levo).Course c/b perforated R colon with right lateral peritoneal fluid collection s/p right lateral abdominal drain placement in IR 01/04/2019 by IR Dr. Kathlene Cote. HF meds stopped during hospital but restarted.   Had post hospital f/u with Dr. Haroldine Laws 2/4. Was doing well from HF standpoint, symptom wise. Labs however showed an AKI with increase in SCr from 1.1>>1.7. Felt to be combination of over diuresis w/ HF meds + poor PO intake following his major abdominal surgery. Torsemide was reduced down from 40 mg bid to 20 mg once daily and Entresto reduced down to low dose, 24-26 mg bid.   He presents back for 6 week f/u. Doing well. Denies exertional and resting  dyspnea. Has been walking more to increase physical activity and no major limitations. No orthopnea/PND. He has however had gradual wt gain, felt to be due to his appetite improving and being more regular. Over the last week, he has noticed mild LEE around his ankles but no dyspnea. He self increased his torsemide to 20 mg bid and his LEE resolved. Reports full compliance w/ meds. BP mildly elevated. No CP. Continues w/ colostomy bag. No complications. Hopefull to get reversal in the future.   From an Afib/flutter standpoint, he denies palpitations. EKG shows NSR w/ low voltage (likely 2/2 body habitus). HR well controlled at 67 bmp. Remains on low dose amiodarone. On Eliquis for stroke/ DVT prophylaxis. Denies abnormal bleeding. No ICD shocks.   Cardiac studies:  Echo 07/2017 EF appears slightly improved to 20-25%. Possible clot (vs artifact in IVC/RA junction) Personally reviewed  EKG 07/2017 Sinus rhythm 63 with 1st degree AV block.    Echo 10/15/16 - LVEF 15% TEE 03/20/17 LVEF 15-20%, Severe reduced RV function  RHC 10/20/16  RA = 3 RV = 45/7 PA = 49/20 (31) PCW = 28 Fick cardiac output/index = 5.0/2.1 PVR = 0.5 WU Ao sat = 95% PA sat = 64%, 61%  12/17/11 ECHO 25-30%  05/20/2012 ECHO EF 40-45%  CARDIAC MRI - 12/18/11  1. Moderately dilated left ventricle with moderately decreased systolic function, EF 83%. Global hypokinesis.  2. Mildly dilated right ventricle with mild to moderately decreased systolic function.  3. No definite LV thrombus noted.  4. Patchy non-subendocardial delayed enhancement seen in  the ventricular septum (see above for description). This is not suggestive of a coronary disease pattern. This could be suggestive of infiltrative disease versus prior myocarditis.  RHC/LHC 12/22/11  Cors: Normal  SH: Lives with his wife and 5 children. Works full time running a Daycare  Review of systems complete and found to be negative unless listed in HPI.    Past Medical History:    Diagnosis Date  . AICD (automatic cardioverter/defibrillator) present 08/2018  . Arthritis   . Chronic combined systolic and diastolic CHF (congestive heart failure) (HCC)   . Deep vein thrombosis (HCC)   . Diverticulitis   . Lupus anticoagulant positive   . Morbid obesity (HCC)   . Mural thrombus of heart    coumadin  . Nonischemic cardiomyopathy (HCC)    a) 12/17/11 echo: LVEF 25-30%, grade 3 diastolic dysfunction (c/w restriction), mod MR, mod LA/LV and mild RA dilatation; b) 12/18/11 cMRI: LVEF 38%, mod LV/mild RV dilatation, global HK, mild-mod RV sys dysfxn, no LV thrombus & patchy non-subendocardial delayed enhancement c/w infil dz or prior myocarditis; c. 07/2018 EF 25-30%.  . Paroxysmal SVT (supraventricular tachycardia) (HCC)   . Pulmonary embolism (HCC)    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    Current Outpatient Medications  Medication Sig Dispense Refill  . acetaminophen (TYLENOL) 650 MG CR tablet Take 1,300 mg by mouth every 8 (eight) hours as needed for pain.    [name] amiodarone (PACERONE) 200 MG tablet Take 1 tablet (200 mg total) by mouth daily. 30 tablet 3  . apixaban (ELIQUIS) 5 MG TABS tablet Take 5 mg by mouth 2 (two) times daily.    . coconut oil OIL Apply 1 application topically as needed (dry skin).     [name] digoxin (LANOXIN) 0.125 MG tablet Take 0.125 mg by mouth daily.    . potassium chloride SA (KLOR-CON) 20 MEQ tablet Take 1 tablet (20 mEq total) by mouth daily. 30 tablet 5  . sacubitril-valsartan (ENTRESTO) 24-26 MG Take 1 tablet by mouth 2 (two) times daily. 60 tablet 5  . spironolactone (ALDACTONE) 25 MG tablet Take 12.5 mg by mouth daily.    [name] torsemide (DEMADEX) 20 MG tablet Take 1 tablet (20 mg total) by mouth daily. Take an extra tablet as needed 30 tablet 5   No current facility-administered medications for this encounter.   PHYSICAL EXAM: Vitals:   03/31/19 1023  BP: (!) 136/92  Pulse: 79  SpO2: 97%  Weight: (!) 141.5 kg (312 lb)   Wt Readings  from Last 3 Encounters:  03/31/19 (!) 141.5 kg (312 lb)  02/17/19 130.5 kg (287 lb 12.8 oz)  01/24/19 130.1 kg (286 lb 13.1 oz)    PHYSICAL EXAM: General:  Well appearing, morbidly obese AAM. No respiratory difficulty HEENT: normal Neck: supple. no JVD. Carotids 2+ bilat; no bruits. No lymphadenopathy or thyromegaly appreciated. Cor: PMI nondisplaced. Regular rate & rhythm. No rubs, gallops or murmurs. Lungs: clear  Abdomen: obese, soft, nontender, nondistended. No hepatosplenomegaly. No bruits or masses. Good bowel sounds. Ileostomy/ colostomy site ok.  Extremities: no cyanosis, clubbing, rash, trace bilateral LEE  Neuro: alert & oriented x 3, cranial nerves grossly intact. moves all 4 extremities w/o difficulty. Affect pleasant.  EKG NSR w/ low voltage, 67 bpm   ASSESSMENT & PLAN:  1. Chronic Systolic Heart Failure: - Echo 10/15/16 - LVEF 15% - TEE 03/2017 LVEF 15-20% - Echo 07/2017 with LVEF 20-25%. - Echo 7/20 EF 25-30% - NYHA Class II -  He recently self increased torsemide back to 20 mg bid given development of bilateral ankle edema.   - Diet is now more regular and appetite increased following colon surgery. LEE improved and volume looks ok. - Continue torsemide 20 mg bid. Will check BMP today. If SCr normalized, will increase Entresto to 49-51 bid. If increase, will repeat BMP again in 7 days   - Continue spiro 12.5 mg daily - Continue digoxin 0.125 mg daily. Check dig level  - Continue carvedilol 3.125 bid - Has ICD followed by Dr. Elberta Fortis   2. H/O Acute diverticulitis with colon resection and ileostomy - wound healing by secondary intention - stable. No complications. Hopes to have reversal in future   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 - Maintaining NSR on EKG. Denies breakthrough symptoms. HR well controlled at 67 bpm. - Currently on amiodarone 200 mg daily but ? Need for long term use given ablation as risk of SE w/ long term use may outweigh benefit. He will  discuss discontinuation at his f/u with Dr. Elberta Fortis. Will check TFTs and TSH today. Reminded he needs annal eye exam  - Continue Eliquis 5 mg BID. Denies bleeding . Check CBC today   4. Obesity: - Body mass index is 48.87 kg/m.  - he has been walking more in an effort to increase physical activity. He was encouraged to continue   F/u w/ Dr. Gala Romney in 2-3 months   Robbie Lis, PA-C  10:33 AM

## 2019-03-31 NOTE — Patient Instructions (Signed)
CHANGE Torsemide to 20 mg, one tablet twice daily  Labs today We will only contact you if something comes back abnormal or we need to make some changes. Otherwise no news is good news!  Your physician recommends that you schedule a follow-up appointment in: 3 months with Dr Gala Romney  Do the following things EVERYDAY: 1) Weigh yourself in the morning before breakfast. Write it down and keep it in a log. 2) Take your medicines as prescribed 3) Eat low salt foods--Limit salt (sodium) to 2000 mg per day.  4) Stay as active as you can everyday 5) Limit all fluids for the day to less than 2 liters  At the Advanced Heart Failure Clinic, you and your health needs are our priority. As part of our continuing mission to provide you with exceptional heart care, we have created designated Provider Care Teams. These Care Teams include your primary Cardiologist (physician) and Advanced Practice Providers (APPs- Physician Assistants and Nurse Practitioners) who all work together to provide you with the care you need, when you need it.   You may see any of the following providers on your designated Care Team at your next follow up: [name] Dr Arvilla Meres . Dr Marca Ancona . Tonye Becket, NP . Robbie Lis, PA . Karle Plumber, PharmD   Please be sure to bring in all your medications bottles to every appointment.

## 2019-04-01 LAB — T3, FREE: T3, Free: 3 pg/mL (ref 2.0–4.4)

## 2019-04-06 ENCOUNTER — Other Ambulatory Visit (HOSPITAL_COMMUNITY): Payer: Self-pay

## 2019-04-06 MED ORDER — APIXABAN 5 MG PO TABS: 5.0000 mg | ORAL_TABLET | Freq: Two times a day (BID) | ORAL | 5 refills | Status: DC

## 2019-04-11 ENCOUNTER — Telehealth (HOSPITAL_COMMUNITY): Payer: Self-pay | Admitting: Cardiology

## 2019-04-11 NOTE — Telephone Encounter (Signed)
Unable to reach patient bt phone regarding lab results My chart message and letter mailed

## 2019-04-13 ENCOUNTER — Telehealth (HOSPITAL_COMMUNITY): Payer: Self-pay | Admitting: *Deleted

## 2019-04-13 DIAGNOSIS — I5022 Chronic systolic (congestive) heart failure: Secondary | ICD-10-CM

## 2019-04-13 NOTE — Telephone Encounter (Signed)
Pt left VM after receiving letter about lab results. I called patient back no answer/left vm requesting a return call.

## 2019-04-19 NOTE — Addendum Note (Signed)
Addended by: Modesta Messing on: 04/19/2019 09:22 AM   Modules accepted: Orders

## 2019-04-19 NOTE — Telephone Encounter (Signed)
Pt returned call he is aware of results. Lab appt scheduled  "Labs stable but Free T4 (thyroid hormone), mildly increased. Will need to monitor on amiodarone. Repeat TSH, Free T3 and Free T4 in 4 weeks "

## 2019-04-26 ENCOUNTER — Ambulatory Visit: Payer: BC Managed Care – PPO | Attending: Family

## 2019-04-26 DIAGNOSIS — Z23 Encounter for immunization: Secondary | ICD-10-CM

## 2019-04-26 NOTE — Progress Notes (Signed)
   Covid-19 Vaccination Clinic  Name:  Roberto [name]    MRN: [id number] DOB: 10/26/1965  04/26/2019  [name] was observed post Covid-19 immunization for 15 minutes without incident. He was provided with Vaccine Information Sheet and instruction to access the V-Safe system.   [name] was instructed to call 911 with any severe reactions post vaccine: [name] Difficulty breathing  . Swelling of face and throat  . A fast heartbeat  . A bad rash all over body  . Dizziness and weakness   Immunizations Administered    Name Date Dose VIS Date Route   Moderna COVID-19 Vaccine 04/26/2019 12:43 PM 0.5 mL 12/14/2018 Intramuscular   Manufacturer: Moderna   Lot: [id number]   NDC: 89211-941-74

## 2019-04-29 ENCOUNTER — Telehealth: Payer: Self-pay | Admitting: Student

## 2019-04-29 ENCOUNTER — Ambulatory Visit (INDEPENDENT_AMBULATORY_CARE_PROVIDER_SITE_OTHER): Payer: BC Managed Care – PPO | Admitting: *Deleted

## 2019-04-29 DIAGNOSIS — I5022 Chronic systolic (congestive) heart failure: Secondary | ICD-10-CM

## 2019-04-29 LAB — CUP PACEART REMOTE DEVICE CHECK
Battery Remaining Longevity: 133 mo
Battery Voltage: 3.06 V
Brady Statistic RV Percent Paced: 0.01 %
Date Time Interrogation Session: 20210416022502
HighPow Impedance: 79 Ohm
Implantable Lead Implant Date: 20200821
Implantable Lead Location: 753860
Implantable Lead Model: 6935
Implantable Pulse Generator Implant Date: 20200821
Lead Channel Impedance Value: 399 Ohm
Lead Channel Impedance Value: 475 Ohm
Lead Channel Pacing Threshold Amplitude: 0.625 V
Lead Channel Pacing Threshold Pulse Width: 0.4 ms
Lead Channel Sensing Intrinsic Amplitude: 6.5 mV
Lead Channel Sensing Intrinsic Amplitude: 6.5 mV
Lead Channel Setting Pacing Amplitude: 2 V
Lead Channel Setting Pacing Pulse Width: 0.4 ms
Lead Channel Setting Sensing Sensitivity: 0.3 mV

## 2019-04-29 NOTE — Telephone Encounter (Signed)
Scheduled remote reviewed which included alert for VF at 250 bpm on 03/05/19 with successful ATP x1 and aborted shock.   No alarm received. Patient states he has had his monitor plugged in without interruptions.    Optivol also elevated. He had follow up with HF clinic about 2 weeks before VF, and several weeks after, and apart from intermittent volume overload was overall doing well.   Pt states he had no specific symptoms during this episode that he remembers. He specifically denies any recent lightheadedness, dizziness, syncope, or near syncope. Denies tachy-palpitations. Optivol maxed out since Middle of February. He denies any more than his usual SOB.   Pt on amiodarone 200 mg daily, denies missing any doses. Recent notes say on coreg 3.125 mg BID, but NOT on current med list. Pt states he cannot remember the last time he took it. Thinks it has been over a year at least. Previously taken off due to bradycardia. Per notes, but then was back on low dose for a period that is unclear.   Baseline HRs 50-60s at rest, so re-challenging may cause increase in RV pacing. Most recent QRS 112 ms.  Will forward to MD for further. No need for repeat labs now 2 months out. I did make him aware of no driving x 6 months per NCD[facility] after ICD therapy.   Suspect we could re-challenge on low dose coreg and follow pacing percentages. Will also forward to HF team as FYI.   Clarified with [employer] that alert will not be received for ATP therapy only with current devices.

## 2019-04-29 NOTE — Progress Notes (Signed)
ICD Remote  

## 2019-04-30 NOTE — Telephone Encounter (Signed)
Can we restart carvedilol 3.125 bid. Please check CMET and thyroid labs in next week or two if not checked this month already. thanks

## 2019-05-03 ENCOUNTER — Telehealth: Payer: Self-pay

## 2019-05-03 ENCOUNTER — Encounter: Payer: Self-pay | Admitting: Internal Medicine

## 2019-05-03 ENCOUNTER — Ambulatory Visit (INDEPENDENT_AMBULATORY_CARE_PROVIDER_SITE_OTHER): Payer: BC Managed Care – PPO | Admitting: Internal Medicine

## 2019-05-03 ENCOUNTER — Other Ambulatory Visit: Payer: Self-pay

## 2019-05-03 VITALS — BP 103/70 | HR 77 | Temp 97.7°F | Ht 68.0 in | Wt 319.2 lb

## 2019-05-03 DIAGNOSIS — T8130XA Disruption of wound, unspecified, initial encounter: Secondary | ICD-10-CM | POA: Diagnosis not present

## 2019-05-03 NOTE — Patient Instructions (Addendum)
Roberto [name],  It was a pleasure meeting you today.  Please start using collagen on the wound with daily dressing changes.  Please call us with any issues or concerns.  Please follow up in 2 weeks

## 2019-05-03 NOTE — Telephone Encounter (Signed)
Patient advised that he has HH who comes out a few times a week and thought it was with [facility]. Darryl Nestle, LMVM regarding Mr. Verdun new wound care order. Clean with saline, use collagen and then ABD pads. Leave enough for patient to change daily when she is not there to dress. Call back to confirm she received my message/order.

## 2019-05-03 NOTE — Progress Notes (Signed)
Subjective:     Patient ID: Roberto [name], male    DOB: 07-01-1965, 54 y.o.   MRN: [id number]  Chief Complaint  Patient presents with   Consult    abodominal wound    HPI: The patient is a 54 y.o. male with history of HFrEF, DVT on Eliquis, nonischemic cardiomyopathy, atrial flutter s/p ablation here for abdominal wound breakdown and dehiscence status post perforated cecum and right colon from complications of diverticulitis with ileostomy in place.  Patient reports that he is doing well overall.  He hasn't had issues with the wound or ostomy in the past several months.  Currently patient is doing once daily dressing changes using wet to dry dressings.  He has a home health nurse that comes out once a week to help with dressing changes.  On the other days him and a family member do the dressing changes.  He has not tried a wound vac nor other types of dressings  He denies purulent drainage or increased warmth to the area   Review of Systems  All other systems reviewed and are negative.    has a past medical history of AICD (automatic cardioverter/defibrillator) present (08/2018), Arthritis, Chronic combined systolic and diastolic CHF (congestive heart failure) ([place]), Deep vein thrombosis ([place]), Diverticulitis, Lupus anticoagulant positive, Morbid obesity ([place]), Mural thrombus of heart, Nonischemic cardiomyopathy ([place]), Paroxysmal SVT (supraventricular tachycardia) ([place]), and Pulmonary embolism ([place]).  has a past surgical history that includes Patellar tendon repair; Cardiac catheterization (06/2006); Cardiac catheterization (12/22/2011); left and right heart catheterization with coronary angiogram (N/A, 12/22/2011); Cardioversion (N/A, 10/17/2016); RIGHT HEART CATH (N/A, 10/20/2016); A-FLUTTER ABLATION (N/A, 11/17/2016); TEE without cardioversion (N/A, 03/20/2017); Cardioversion (N/A, 03/20/2017); ICD IMPLANT (N/A, 09/03/2018); Flexible sigmoidoscopy (N/A, 12/23/2018); biopsy (12/23/2018);  Colectomy (N/A, 12/28/2018); Ileostomy (N/A, 12/28/2018); and IR Radiologist Eval & Mgmt (02/08/2019).  reports that he has never smoked. He has never used smokeless tobacco. Objective:   Vital Signs BP 103/70 (BP Location: Right Arm, Patient Position: Sitting, Cuff Size: Large)    Pulse 77    Temp 97.7 F (36.5 C) (Temporal)    Ht 5\' 8"  (1.727 m)    Wt (!) 319 lb 3.2 oz (144.8 kg)    SpO2 97%    BMI 48.53 kg/m  Vital Signs and Nursing Note Reviewed Physical Exam  Constitutional: He is well-developed, well-nourished, and in no distress.  Skin:  Abdominal wound measures:  11.0 x 11.0 x 3.5 cm Tunneling at the base Granulation tissue throughout         Assessment/Plan:     ICD-10-CM   1. Abdominal wound dehiscence, initial encounter  T81.30XA    Assessment: Abdominal wound dehiscence   In December 2020 patient was admitted for diverticulitis and found to have a left colonic stricture with perforated cecum and right colon.  He had a long postop course with wound breakdown and dehiscence.  He was referred to our office by Dr. Donne Hazel and his office notes were reviewed.  Patient currently has an ileostomy and at some point will be considered for reversal.  We will work on wound care to help for this transition.  Today the wound appears well healing with no signs of infection.  At the base of the wound there is tunneling that measures about 3.5 cm.  When assessing the depth there was bleeding noted and silver nitrate was used to cauterize the area.  This area will likely take some time to fill in.  I will change the dressings to  collagen daily.  If no improvement will need to consider wound vac vs abar.    Plan -wet to dry dressing change in office -silver nitrate in office - Home health orders for collagen daily dressing changes -follow up in 2 weeks     [name], DO 05/03/2019, 10:06 AM

## 2019-05-03 NOTE — Telephone Encounter (Signed)
pt is already sch for labs on 5/4, attempted to call him about med change and Left message to call back

## 2019-05-04 ENCOUNTER — Telehealth: Payer: Self-pay | Admitting: Plastic Surgery

## 2019-05-04 NOTE — Telephone Encounter (Signed)
Roberto [name] called to provide fax number to [facility]. [phone]. He said the orders can be faxed to that number. Call if you have any questions.

## 2019-05-04 NOTE — Telephone Encounter (Signed)
Pt left VM returning call I called pt back no answer/left VM requesting return call

## 2019-05-04 NOTE — Telephone Encounter (Signed)
error 

## 2019-05-05 ENCOUNTER — Telehealth: Payer: Self-pay | Admitting: Internal Medicine

## 2019-05-05 ENCOUNTER — Telehealth: Payer: Self-pay

## 2019-05-05 MED ORDER — CARVEDILOL 3.125 MG PO TABS: 3.1250 mg | ORAL_TABLET | Freq: Two times a day (BID) | ORAL | 3 refills | Status: DC

## 2019-05-05 NOTE — Telephone Encounter (Signed)
Called Lisa back at [facility]. Advised her to clean wound with saline, use collagen and then ABD pads. Leave enough for patient to change daily. She will do the wet to dry until she receives items.  

## 2019-05-05 NOTE — Telephone Encounter (Signed)
Pt called back he is aware of medication change. Medication sent to pharmacy.

## 2019-05-05 NOTE — Telephone Encounter (Signed)
Faxed office notes from 05/03/19  to Dr. Dwain Sarna

## 2019-05-05 NOTE — Telephone Encounter (Signed)
Called Lisa back at [facility]. Advised her to clean wound with saline, use collagen and then ABD pads. Leave enough for patient to change daily. She will do the wet to dry until she receives items.

## 2019-05-05 NOTE — Telephone Encounter (Signed)
Macario Golds, will be seeing the patient today between 2:30-4 and wanted to get the updated wound care orders to make sure she had what was needed for the change or if she needed to order anything. Please call her before 2pm if possible at [phone]

## 2019-05-05 NOTE — Telephone Encounter (Signed)
Lisa from [facility] called back to find out wound care orders. Read previous note from [facility] for her regarding wound care changes. Misty Stanley said she doesn't need a call back unless there is anything to be added from the previous note.

## 2019-05-09 ENCOUNTER — Telehealth: Payer: Self-pay | Admitting: *Deleted

## 2019-05-09 ENCOUNTER — Other Ambulatory Visit (HOSPITAL_COMMUNITY): Payer: Self-pay

## 2019-05-09 MED ORDER — AMIODARONE HCL 200 MG PO TABS: 200.0000 mg | ORAL_TABLET | Freq: Every day | ORAL | 5 refills | Status: DC

## 2019-05-09 NOTE — Telephone Encounter (Signed)
Received Physician Order via of fax from [facility].  Requesting signature,date, and return.  Given to provider to sign.    Orders signed and faxed to [facility].  Confirmation received.  Copy scanned into the chart.//AB/CMA

## 2019-05-11 ENCOUNTER — Telehealth (HOSPITAL_COMMUNITY): Payer: Self-pay | Admitting: Pharmacist

## 2019-05-11 NOTE — Telephone Encounter (Signed)
Advanced Heart Failure Patient Advocate Encounter  Prior Authorization for Sherryll Burger has been approved.    Effective dates: 05/10/19 through 05/09/20  Karle Plumber, PharmD, BCPS, BCCP, CPP Heart Failure Clinic Pharmacist [phone]

## 2019-05-17 ENCOUNTER — Other Ambulatory Visit (HOSPITAL_COMMUNITY): Payer: Self-pay

## 2019-05-17 ENCOUNTER — Other Ambulatory Visit (HOSPITAL_COMMUNITY): Payer: BC Managed Care – PPO

## 2019-05-17 ENCOUNTER — Other Ambulatory Visit: Payer: Self-pay

## 2019-05-17 ENCOUNTER — Ambulatory Visit (INDEPENDENT_AMBULATORY_CARE_PROVIDER_SITE_OTHER): Payer: BC Managed Care – PPO | Admitting: Internal Medicine

## 2019-05-17 ENCOUNTER — Encounter: Payer: Self-pay | Admitting: Internal Medicine

## 2019-05-17 VITALS — BP 107/69 | HR 77 | Temp 97.5°F | Ht 68.0 in | Wt 319.0 lb

## 2019-05-17 DIAGNOSIS — T8130XA Disruption of wound, unspecified, initial encounter: Secondary | ICD-10-CM | POA: Diagnosis not present

## 2019-05-17 DIAGNOSIS — I5022 Chronic systolic (congestive) heart failure: Secondary | ICD-10-CM

## 2019-05-17 NOTE — Progress Notes (Signed)
   Subjective:     Patient ID: Roberto [name], male    DOB: 1965-05-25, 54 y.o.   MRN: [id number]  Chief Complaint  Patient presents with  . Follow-up    2 weeks for abdominal wound    HPI: The patient is a 54 y.o. male  with history of HFrEF, DVT on Eliquis, nonischemic cardiomyopathy, atrial flutter s/p ablation here for abdominal wound breakdown and dehiscence status post perforated cecum and right colon from complications of diverticulitis with ileostomy in place.  Patient reports doing collagen daily dressing changes.  He continues to have the help of a home health nurse once weekly and his daughter and himself change the dressings on other days.  He reports improvement in wound size.  He reports moderate drainage with clear fluid.  He denies purulent drainage or increased warmth and erythema to the area.  We discussed the potential use of Delsa Bern and patient was interested.  Review of Systems  All other systems reviewed and are negative.    has a past medical history of AICD (automatic cardioverter/defibrillator) present (08/2018), Arthritis, Chronic combined systolic and diastolic CHF (congestive heart failure) (HCC), Deep vein thrombosis (HCC), Diverticulitis, Lupus anticoagulant positive, Morbid obesity (HCC), Mural thrombus of heart, Nonischemic cardiomyopathy (HCC), Paroxysmal SVT (supraventricular tachycardia) (HCC), and Pulmonary embolism (HCC).  has a past surgical history that includes Patellar tendon repair; Cardiac catheterization (06/2006); Cardiac catheterization (12/22/2011); left and right heart catheterization with coronary angiogram (N/A, 12/22/2011); Cardioversion (N/A, 10/17/2016); RIGHT HEART CATH (N/A, 10/20/2016); A-FLUTTER ABLATION (N/A, 11/17/2016); TEE without cardioversion (N/A, 03/20/2017); Cardioversion (N/A, 03/20/2017); ICD IMPLANT (N/A, 09/03/2018); Flexible sigmoidoscopy (N/A, 12/23/2018); biopsy (12/23/2018); Colectomy (N/A, 12/28/2018); Ileostomy (N/A, 12/28/2018); and  IR Radiologist Eval & Mgmt (02/08/2019).  reports that he has never smoked. He has never used smokeless tobacco. Objective:   Vital Signs BP 107/69 (BP Location: Left Arm, Patient Position: Sitting, Cuff Size: Large)   Pulse 77   Temp (!) 97.5 F (36.4 C) (Temporal)   Ht 5\' 8"  (1.727 m)   Wt (!) 319 lb (144.7 kg)   SpO2 97%   BMI 48.50 kg/m  Vital Signs and Nursing Note Reviewed Physical Exam  Constitutional: He is well-developed, well-nourished, and in no distress.  Skin:  Wound measures 10.5 x 9.0 x 0.1 cm Tunneling at the base is 3 cm deep Granulation tissue throughout        Assessment/Plan:     ICD-10-CM   1. Abdominal wound dehiscence, initial encounter  T81.30XA    Assessment: Abdominal wound dehiscence  Today the wound shows improvement and overall appears well with no signs of infection.  The size has decreased.  There is still tunneling at the base however the depth has decreased.  I recommended he continue using collagen with daily dressing changes.  I spoke with [employer] rep about potentially using abra to help with wound closure/healing.  We will set up a time next week for him to see the wound in person and make further recommendations.    Plan -Collagen dressing change in office -Collagen daily dressing changes -Follow-up next week  , DO 05/17/2019, 9:44 AM

## 2019-05-23 ENCOUNTER — Encounter: Payer: Self-pay | Admitting: Internal Medicine

## 2019-05-23 ENCOUNTER — Ambulatory Visit (HOSPITAL_COMMUNITY)
Admission: RE | Admit: 2019-05-23 | Discharge: 2019-05-23 | Disposition: A | Discharge status: Home / Self Care | Payer: BC Managed Care – PPO | Source: Ambulatory Visit | Attending: Internal Medicine | Admitting: Internal Medicine

## 2019-05-23 ENCOUNTER — Other Ambulatory Visit: Payer: Self-pay

## 2019-05-23 ENCOUNTER — Ambulatory Visit (INDEPENDENT_AMBULATORY_CARE_PROVIDER_SITE_OTHER): Payer: BC Managed Care – PPO | Admitting: Internal Medicine

## 2019-05-23 VITALS — BP 103/68 | HR 71 | Temp 97.3°F | Ht 68.0 in | Wt 319.0 lb

## 2019-05-23 DIAGNOSIS — T8130XA Disruption of wound, unspecified, initial encounter: Secondary | ICD-10-CM

## 2019-05-23 DIAGNOSIS — I5022 Chronic systolic (congestive) heart failure: Secondary | ICD-10-CM | POA: Insufficient documentation

## 2019-05-23 LAB — COMPREHENSIVE METABOLIC PANEL
ALT: 13 U/L (ref 0–44)
AST: 17 U/L (ref 15–41)
Albumin: 2.9 g/dL — ABNORMAL LOW (ref 3.5–5.0)
Alkaline Phosphatase: 40 U/L (ref 38–126)
Anion gap: 11 (ref 5–15)
BUN: 13 mg/dL (ref 6–20)
CO2: 23 mmol/L (ref 22–32)
Calcium: 8.4 mg/dL — ABNORMAL LOW (ref 8.9–10.3)
Chloride: 106 mmol/L (ref 98–111)
Creatinine, Ser: 1.21 mg/dL (ref 0.61–1.24)
GFR calc Af Amer: >60 mL/min (ref >60)
GFR calc non Af Amer: >60 mL/min (ref >60)
Glucose, Bld: 131 mg/dL — ABNORMAL HIGH (ref 70–99)
Potassium: 3.5 mmol/L (ref 3.5–5.1)
Sodium: 140 mmol/L (ref 135–145)
Total Bilirubin: 0.4 mg/dL (ref 0.3–1.2)
Total Protein: 7.3 g/dL (ref 6.5–8.1)

## 2019-05-23 LAB — T4, FREE: Free T4: 0.96 ng/dL (ref 0.61–1.12)

## 2019-05-23 LAB — TSH: TSH: 0.972 u[IU]/mL (ref 0.350–4.500)

## 2019-05-23 NOTE — Progress Notes (Signed)
   Subjective:     Patient ID: Roberto [name], male    DOB: Oct 12, 1965, 54 y.o.   MRN: [id number]  Chief Complaint  Patient presents with  . Follow-up    abdominal wound dehiscence    HPI: The patient is a 54 y.o. male with history of HFrEF, DVT on Eliquis, nonischemic cardiomyopathy, atrial flutter s/p ablationhere forabdominal wound breakdown and dehiscence status post perforated cecum and right colon from complications of diverticulitis withileostomy in place.  Patient continues to do collagen daily dressing changes.  He has noticed improvement in wound size.  He continues to have mild to moderate drainage.  He denies purulent drainage or increased warmth and erythema to the area.  He would like to proceed with using the [facility] device.  He was also agreeable to using donated ACell.  Review of Systems  All other systems reviewed and are negative.    has a past medical history of AICD (automatic cardioverter/defibrillator) present (08/2018), Arthritis, Chronic combined systolic and diastolic CHF (congestive heart failure) (HCC), Deep vein thrombosis (HCC), Diverticulitis, Lupus anticoagulant positive, Morbid obesity (HCC), Mural thrombus of heart, Nonischemic cardiomyopathy (HCC), Paroxysmal SVT (supraventricular tachycardia) (HCC), and Pulmonary embolism (HCC).  has a past surgical history that includes Patellar tendon repair; Cardiac catheterization (06/2006); Cardiac catheterization (12/22/2011); left and right heart catheterization with coronary angiogram (N/A, 12/22/2011); Cardioversion (N/A, 10/17/2016); RIGHT HEART CATH (N/A, 10/20/2016); A-FLUTTER ABLATION (N/A, 11/17/2016); TEE without cardioversion (N/A, 03/20/2017); Cardioversion (N/A, 03/20/2017); ICD IMPLANT (N/A, 09/03/2018); Flexible sigmoidoscopy (N/A, 12/23/2018); biopsy (12/23/2018); Colectomy (N/A, 12/28/2018); Ileostomy (N/A, 12/28/2018); and IR Radiologist Eval & Mgmt (02/08/2019).  reports that he has never smoked. He has never used  smokeless tobacco. Objective:   Vital Signs BP 103/68 (BP Location: Left Arm, Patient Position: Sitting, Cuff Size: Large)   Pulse 71   Temp (!) 97.3 F (36.3 C) (Temporal)   Ht 5\' 8"  (1.727 m)   Wt (!) 319 lb (144.7 kg)   SpO2 97%   BMI 48.50 kg/m  Vital Signs and Nursing Note Reviewed Physical Exam  Skin: Skin is warm.  Abdominal wound measures: 9.5 x 7.5 x 0.1 cm Tunneling noted to be about 2 cm Granulation tissue noted throughout        Assessment/Plan:     ICD-10-CM   1. Abdominal wound dehiscence, initial encounter  T81.30XA    Assessment: Abdominal wound dehiscence  The wound continues to show improvement with no signs of infection today.  The overall size continues to decrease.  There is still tunneling at the base however depth has decreased.  I recommended he continue using collagen with daily dressing changes.  was placed in the office today without any issues.  3 hooks were adhered to each side for a total of 6 adhesions with hooks. 6 dashes were between each hook.  The patient was instructed to tighten the cords minimally every day.  He should have 4-5 dashes between each hook by the time I see him in one week.  Donated Acell was applied to the area of tunneling.   Plan -Collagen dressing change in office with abd  -Abra placed in office - follow up in 1 week for removal of abra adhesives and replacement     Delsa Bern, DO 05/23/2019, 2:33 PM

## 2019-05-24 ENCOUNTER — Telehealth: Payer: Self-pay

## 2019-05-24 LAB — T3, FREE: T3, Free: 2.8 pg/mL (ref 2.0–4.4)

## 2019-05-24 NOTE — Telephone Encounter (Signed)
Patient called and said the sticky part of the [facility] device is not sticking to his skin. He has been using tape. Dr. Mikey Bussing offered to see patient at no charge if he wanted to come in. He said it was just more of FYI.

## 2019-05-24 NOTE — Telephone Encounter (Signed)
Patient called to state that the wound care that was applied yesterday does not seem to staying in place.

## 2019-05-24 NOTE — Telephone Encounter (Signed)
Faxed medical supply to [facility] for: collagen sheets, ABD pads, silicone tape, and normal saline. Patient has HHC, but would like to see if [facility] will pay for supplies. If they will, he will cancel the [facility].

## 2019-05-26 ENCOUNTER — Telehealth: Payer: Self-pay

## 2019-05-26 NOTE — Telephone Encounter (Signed)
Called [facility], spoke with [place]. Insurance paid for everything but the silicone tape.They were unable to reach the patient to offer tape at  discount price. Called patient and provided [facility]'s number and for him to call about the tape. Patient has currently HHC, but didn't have the phone number with him at the time. He is to call back and provide the name and number. I will call to cancel since [facility] now has supplies coming to the house. He no longer needs a nurse to come by for dressing/wound care.

## 2019-05-30 ENCOUNTER — Other Ambulatory Visit: Payer: Self-pay

## 2019-05-30 ENCOUNTER — Encounter: Payer: Self-pay | Admitting: Internal Medicine

## 2019-05-30 ENCOUNTER — Telehealth: Payer: Self-pay

## 2019-05-30 ENCOUNTER — Ambulatory Visit (INDEPENDENT_AMBULATORY_CARE_PROVIDER_SITE_OTHER): Payer: BC Managed Care – PPO | Admitting: Internal Medicine

## 2019-05-30 VITALS — BP 102/69 | HR 74 | Temp 97.3°F | Ht 68.0 in | Wt 319.0 lb

## 2019-05-30 DIAGNOSIS — T8130XA Disruption of wound, unspecified, initial encounter: Secondary | ICD-10-CM

## 2019-05-30 NOTE — Progress Notes (Signed)
Subjective:     Patient ID: Roberto [name], male    DOB: 1965-01-27, 54 y.o.   MRN: [id number]  Chief Complaint  Patient presents with  . Follow-up    abdomial wound dehiscence    HPI: The patient is a 54 y.o. 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  Patient states that the tabs on the abra device that was placed at last clinic visit on 5/10 had started falling off a few days after placed. He continues to use collagen dressings daily. He continues to notice improvement in the wound size. He is having mild drainage at this time. He denies purulent drainage, increased warmth or erythema to the area.  He is going out of town over the weekend and will be back in 3 weeks.  Review of Systems  All other systems reviewed and are negative.   has a past medical history of AICD (automatic cardioverter/defibrillator) present (08/2018), Arthritis, Chronic combined systolic and diastolic CHF (congestive heart failure) ([place]), Deep vein thrombosis ([place]), Diverticulitis, Lupus anticoagulant positive, Morbid obesity ([place]), Mural thrombus of heart, Nonischemic cardiomyopathy ([place]), Paroxysmal SVT (supraventricular tachycardia) ([place]), and Pulmonary embolism ([place]).  has a past surgical history that includes Patellar tendon repair; Cardiac catheterization (06/2006); Cardiac catheterization (12/22/2011); left and right heart catheterization with coronary angiogram (N/A, 12/22/2011); Cardioversion (N/A, 10/17/2016); RIGHT HEART CATH (N/A, 10/20/2016); A-FLUTTER ABLATION (N/A, 11/17/2016); TEE without cardioversion (N/A, 03/20/2017); Cardioversion (N/A, 03/20/2017); ICD IMPLANT (N/A, 09/03/2018); Flexible sigmoidoscopy (N/A, 12/23/2018); biopsy (12/23/2018); Colectomy (N/A, 12/28/2018); Ileostomy (N/A, 12/28/2018); and  IR Radiologist Eval & Mgmt (02/08/2019).  reports that he has never smoked. He has never used smokeless tobacco. Objective:   Vital Signs BP 102/69   Pulse 74   Temp (!) 97.3 F (36.3 C) (Temporal)   Ht 5\' 8"  (1.727 m)   Wt (!) 319 lb (144.7 kg)   SpO2 97%   BMI 48.50 kg/m  Vital Signs and Nursing Note Reviewed  Physical Exam  Skin:  Abdominal wound measures: 8.0 x 8.5 x 0.1 Undermining at the base noted to be 0.5 cm Granulation tissue noted throughout Sloughing noted in the undermining area and around the wound margins        Assessment/Plan:     ICD-10-CM   1. Abdominal wound dehiscence, initial encounter  T81.30XA     Assessment: Abdominal wound dehiscence  I am very pleased with the current improvement in wound healing.  The overall size continues to decrease.  The undermining is now 0.5cm compared to 3.5cm one month ago. There are no signs of infection today. There is sloughing noted around the margins of the wound and in the undermining area. This was debrided with curette, gauze and wound cleanser.   We attempted the [facility] device at last visit however since it did not stay in place and there was some skin irritation and breakdown due to the tabs we will discontinue it at this time.   The patient received supplies from [facility] and is able to do collagen daily dressing changes on his own. He would like to go out of town for 3 weeks to visit his girlfriend. I told him to call our office with any questions or concerns while he is away.  Plan -In office sharp debridement with curette, gauze and wound cleanser -In office dressing change with collagen and donated ACell -Follow-up  in 3-week -Continue daily dressing changes with collagen and ABD    [name], DO 05/30/2019, 9:41 AM

## 2019-05-30 NOTE — Telephone Encounter (Signed)
Faxed a notice to Misty Stanley at [facility] to cancel services effective of his last visit with her which was 05/26/19. He is getting his products through [facility].  Faxed [phone]

## 2019-06-27 ENCOUNTER — Encounter: Payer: Self-pay | Admitting: Internal Medicine

## 2019-06-27 ENCOUNTER — Ambulatory Visit (INDEPENDENT_AMBULATORY_CARE_PROVIDER_SITE_OTHER): Payer: BC Managed Care – PPO | Admitting: Internal Medicine

## 2019-06-27 ENCOUNTER — Other Ambulatory Visit: Payer: Self-pay

## 2019-06-27 VITALS — BP 105/68 | HR 79 | Temp 96.9°F

## 2019-06-27 DIAGNOSIS — T8130XA Disruption of wound, unspecified, initial encounter: Secondary | ICD-10-CM

## 2019-06-27 NOTE — Progress Notes (Signed)
   Subjective:     Patient ID: Roberto [name], male    DOB: 1965/03/01, 54 y.o.   MRN: [id number]  Chief Complaint  Patient presents with  . Follow-up    HPI: The patient is a 54 y.o. male with history of HFrEF, DVT on Eliquis, nonischemic cardiomyopathy, atrial flutter s/p ablationhere forabdominal wound breakdown and dehiscence s/p perforated cecum and right colon from complications of diverticulitis withileostomy in place.  Patient states that he has been using collagen dressings daily.  He continues to notice improvement to the wound size and appearance.  He denies acute pain, purulent drainage or increased warmth or redness to the area.  He reports no issues today.  He continues to use the abdominal binder when exercising.  Review of Systems  All other systems reviewed and are negative.    has a past medical history of AICD (automatic cardioverter/defibrillator) present (08/2018), Arthritis, Chronic combined systolic and diastolic CHF (congestive heart failure) (HCC), Deep vein thrombosis (HCC), Diverticulitis, Lupus anticoagulant positive, Morbid obesity (HCC), Mural thrombus of heart, Nonischemic cardiomyopathy (HCC), Paroxysmal SVT (supraventricular tachycardia) (HCC), and Pulmonary embolism (HCC).  has a past surgical history that includes Patellar tendon repair; Cardiac catheterization (06/2006); Cardiac catheterization (12/22/2011); left and right heart catheterization with coronary angiogram (N/A, 12/22/2011); Cardioversion (N/A, 10/17/2016); RIGHT HEART CATH (N/A, 10/20/2016); A-FLUTTER ABLATION (N/A, 11/17/2016); TEE without cardioversion (N/A, 03/20/2017); Cardioversion (N/A, 03/20/2017); ICD IMPLANT (N/A, 09/03/2018); Flexible sigmoidoscopy (N/A, 12/23/2018); biopsy (12/23/2018); Colectomy (N/A, 12/28/2018); Ileostomy (N/A, 12/28/2018); and IR Radiologist Eval & Mgmt (02/08/2019).  reports that he has never smoked. He has never used smokeless tobacco. Objective:   Vital Signs BP 105/68  (BP Location: Left Arm, Patient Position: Sitting, Cuff Size: Large)   Pulse 79   Temp (!) 96.9 F (36.1 C) (Tympanic)   SpO2 97%  Vital Signs and Nursing Note Reviewed Physical Exam Skin:    Comments: Abdominal wound measures: 7.5 x 7.0 x 0.1 cm Undermining noted to be 0.5cm at the base Granulation tissue throughout Epithelialization to the edges          Assessment/Plan:     ICD-10-CM   1. Abdominal wound dehiscence, initial encounter  T81.30XA    Assessment: Abdominal wound dehiscence  It has been 2 months since the start of collagen dressings.  The wound continues to improve with no signs of infection at today's visit.  The base of the wound continues to fill in especially from the sides.  I encouraged he continue to use his abdominal binder.  I will see him in 2 weeks.  Plan -in office dressing change with adaptic and vaseline -daily dressing changes with collagen - follow up in 2 weeks - abdominal binder when exercising   [name], DO 06/27/2019, 10:37 AM

## 2019-07-04 ENCOUNTER — Encounter (HOSPITAL_COMMUNITY): Payer: BC Managed Care – PPO | Admitting: Internal Medicine

## 2019-07-11 ENCOUNTER — Other Ambulatory Visit: Payer: Self-pay

## 2019-07-11 ENCOUNTER — Encounter: Payer: Self-pay | Admitting: Internal Medicine

## 2019-07-11 ENCOUNTER — Ambulatory Visit (INDEPENDENT_AMBULATORY_CARE_PROVIDER_SITE_OTHER): Payer: BC Managed Care – PPO | Admitting: Internal Medicine

## 2019-07-11 VITALS — BP 101/63 | HR 71 | Temp 97.5°F

## 2019-07-11 DIAGNOSIS — T8130XD Disruption of wound, unspecified, subsequent encounter: Secondary | ICD-10-CM

## 2019-07-11 NOTE — Progress Notes (Signed)
   Subjective:     Patient ID: Roberto [name], male    DOB: Dec 25, 1965, 54 y.o.   MRN: [id number]  Chief Complaint  Patient presents with  . Follow-up    HPI: The patient is a 54 y.o. 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  Patient continues to use collagen dressings daily.  He reports no issues today.  He states he is continuing to exercise daily and uses an abdominal binder during more strenuous activities.  He denies acute pain, increased drainage or increased warmth or redness to the area.    Review of Systems  All other systems reviewed and are negative.    has a past medical history of AICD (automatic cardioverter/defibrillator) present (08/2018), Arthritis, Chronic combined systolic and diastolic CHF (congestive heart failure) (HCC), Deep vein thrombosis (HCC), Diverticulitis, Lupus anticoagulant positive, Morbid obesity (HCC), Mural thrombus of heart, Nonischemic cardiomyopathy (HCC), Paroxysmal SVT (supraventricular tachycardia) (HCC), and Pulmonary embolism (HCC).  has a past surgical history that includes Patellar tendon repair; Cardiac catheterization (06/2006); Cardiac catheterization (12/22/2011); left and right heart catheterization with coronary angiogram (N/A, 12/22/2011); Cardioversion (N/A, 10/17/2016); RIGHT HEART CATH (N/A, 10/20/2016); A-FLUTTER ABLATION (N/A, 11/17/2016); TEE without cardioversion (N/A, 03/20/2017); Cardioversion (N/A, 03/20/2017); ICD IMPLANT (N/A, 09/03/2018); Flexible sigmoidoscopy (N/A, 12/23/2018); biopsy (12/23/2018); Colectomy (N/A, 12/28/2018); Ileostomy (N/A, 12/28/2018); and IR Radiologist Eval & Mgmt (02/08/2019).  reports that he has never smoked. He has never used smokeless tobacco. Objective:   Vital Signs BP 101/63 (BP Location: Left Arm,  Patient Position: Sitting, Cuff Size: Large)   Pulse 71   Temp (!) 97.5 F (36.4 C) (Temporal)   SpO2 96%  Vital Signs and Nursing Note Reviewed Physical Exam Skin:    Comments: Abdominal wound measures: 7.5 x 7.0 x 0.1 cm Undermining noted to be 0.5cm at the base Granulation tissue throughout Epithelialization to the edges           Assessment/Plan:     ICD-10-CM   1. Abdominal wound dehiscence, subsequent encounter  T81.30XD    Assessment: Abdominal wound dehiscence  The wound appears stable, well healing with no signs of infection.  There has been a bit of a stall in the epithelialization of the wound bed.  Patient may benefit from a skin graft.  Will discuss case with Dr. Arita Miss.  At this time will continue with collagen daily dressings  Plan - in office dressing change with collagen - daily dressing changes with collagen - follow up in 3 weeks - abdominal binder when exercising   [name], DO 07/11/2019, 10:04 AM

## 2019-07-29 ENCOUNTER — Ambulatory Visit (INDEPENDENT_AMBULATORY_CARE_PROVIDER_SITE_OTHER): Payer: BC Managed Care – PPO | Admitting: *Deleted

## 2019-07-29 DIAGNOSIS — I5022 Chronic systolic (congestive) heart failure: Secondary | ICD-10-CM

## 2019-07-29 LAB — CUP PACEART REMOTE DEVICE CHECK
Battery Remaining Longevity: 131 mo
Battery Voltage: 3.05 V
Brady Statistic RV Percent Paced: 0.03 %
Date Time Interrogation Session: 20210716022603
HighPow Impedance: 82 Ohm
Implantable Lead Implant Date: 20200821
Implantable Lead Location: 753860
Implantable Lead Model: 6935
Implantable Pulse Generator Implant Date: 20200821
Lead Channel Impedance Value: 399 Ohm
Lead Channel Impedance Value: 513 Ohm
Lead Channel Pacing Threshold Amplitude: 0.75 V
Lead Channel Pacing Threshold Pulse Width: 0.4 ms
Lead Channel Sensing Intrinsic Amplitude: 7.875 mV
Lead Channel Sensing Intrinsic Amplitude: 7.875 mV
Lead Channel Setting Pacing Amplitude: 2 V
Lead Channel Setting Pacing Pulse Width: 0.4 ms
Lead Channel Setting Sensing Sensitivity: 0.3 mV

## 2019-08-01 ENCOUNTER — Encounter: Payer: Self-pay | Admitting: Internal Medicine

## 2019-08-01 ENCOUNTER — Other Ambulatory Visit: Payer: Self-pay

## 2019-08-01 ENCOUNTER — Ambulatory Visit (INDEPENDENT_AMBULATORY_CARE_PROVIDER_SITE_OTHER): Payer: BC Managed Care – PPO | Admitting: Internal Medicine

## 2019-08-01 VITALS — BP 104/70 | HR 69 | Temp 98.6°F

## 2019-08-01 DIAGNOSIS — T8130XD Disruption of wound, unspecified, subsequent encounter: Secondary | ICD-10-CM

## 2019-08-01 NOTE — Progress Notes (Signed)
   Subjective:     Patient ID: Roberto [name], male    DOB: Dec 07, 1965, 54 y.o.   MRN: [id number]  Chief Complaint  Patient presents with  . Follow-up for abdominal wound    HPI: The patient is a 54 y.o. 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  Patient uses collagen daily with dressing changes.  He reports an 8lb weight loss recently due to exercise and dieting.  He continues to use the abdominal binder during exercise.  He reports no issues today.   He denies acute pain, increased drainage or increased warmth or redness to the area.  He would like to see if a skin graft is possible.  Review of Systems  All other systems reviewed and are negative.    has a past medical history of AICD (automatic cardioverter/defibrillator) present (08/2018), Arthritis, Chronic combined systolic and diastolic CHF (congestive heart failure) (HCC), Deep vein thrombosis (HCC), Diverticulitis, Lupus anticoagulant positive, Morbid obesity (HCC), Mural thrombus of heart, Nonischemic cardiomyopathy (HCC), Paroxysmal SVT (supraventricular tachycardia) (HCC), and Pulmonary embolism (HCC).  has a past surgical history that includes Patellar tendon repair; Cardiac catheterization (06/2006); Cardiac catheterization (12/22/2011); left and right heart catheterization with coronary angiogram (N/A, 12/22/2011); Cardioversion (N/A, 10/17/2016); RIGHT HEART CATH (N/A, 10/20/2016); A-FLUTTER ABLATION (N/A, 11/17/2016); TEE without cardioversion (N/A, 03/20/2017); Cardioversion (N/A, 03/20/2017); ICD IMPLANT (N/A, 09/03/2018); Flexible sigmoidoscopy (N/A, 12/23/2018); biopsy (12/23/2018); Colectomy (N/A, 12/28/2018); Ileostomy (N/A, 12/28/2018); and IR Radiologist Eval & Mgmt (02/08/2019).  reports that he has never smoked. He has never  used smokeless tobacco. Objective:   Vital Signs BP 104/70 (BP Location: Left Arm, Patient Position: Sitting, Cuff Size: Large)   Pulse 69   Temp 98.6 F (37 C) (Oral)   SpO2 96%  Vital Signs and Nursing Note Reviewed Physical Exam Skin:    Comments: Abdominal wound measures: 6.8  x 5.5 x 0.1 cm Undermining noted to be 0.5cm at the base Granulation tissue throughout Epithelialization surrounding the wound bed             Assessment/Plan:     ICD-10-CM   1. Abdominal wound dehiscence, subsequent encounter  T81.30XD    Assessment: Abdominal wound dehiscence  The wound has decreased in size since last visit, however wound healing is progressing slowly.  The wound bed looks healthy with no signs of infection today.  We discussed using endoform vs consult for skin graft.  He would like to see if a skin graft is possible.  Will discuss case with Dr. Arita Miss.  At this time will continue with collagen daily dressings.  If not a candidate for skin graft will try and switch to endoform.  Plan - in office dressing change with collagen - daily dressing changes with collagen - follow up in 2 weeks - abdominal binder when exercising  [name], DO 08/01/2019, 9:21 AM

## 2019-08-01 NOTE — Progress Notes (Signed)
Remote ICD transmission.   

## 2019-08-12 ENCOUNTER — Other Ambulatory Visit (HOSPITAL_COMMUNITY): Payer: Self-pay | Admitting: *Deleted

## 2019-08-12 MED ORDER — TORSEMIDE 20 MG PO TABS: 20.0000 mg | ORAL_TABLET | Freq: Two times a day (BID) | ORAL | 5 refills | Status: DC

## 2019-08-22 ENCOUNTER — Other Ambulatory Visit: Payer: Self-pay

## 2019-08-22 ENCOUNTER — Ambulatory Visit: Payer: BC Managed Care – PPO | Admitting: Internal Medicine

## 2019-08-22 ENCOUNTER — Encounter: Payer: Self-pay | Admitting: Plastic Surgery

## 2019-08-22 ENCOUNTER — Ambulatory Visit (INDEPENDENT_AMBULATORY_CARE_PROVIDER_SITE_OTHER): Payer: BC Managed Care – PPO | Admitting: Plastic Surgery

## 2019-08-22 VITALS — BP 106/66 | HR 75 | Temp 98.4°F

## 2019-08-22 DIAGNOSIS — T8130XD Disruption of wound, unspecified, subsequent encounter: Secondary | ICD-10-CM | POA: Diagnosis not present

## 2019-08-22 NOTE — Progress Notes (Signed)
Referring Provider [name], MD [address] [place],  [place] [address]   CC:  Chief Complaint  Patient presents with   Advice Only      Roberto [name] is an 54 y.o. male.  HPI: Patient presents for discussion of skin graft for chronic abdominal wound.  It sounds like he had issues with diverticulitis that required a abdominal operation with a diverting ostomy.  He had a incisional dehiscence at the midline which is resulted in an open wound.  Conservative measures have been done on this for a few months but it appears to have stalled.  He is interested in any means that could speed along the healing process.  Allergies  Allergen Reactions   Shrimp [Shellfish Allergy] Hives and Itching    Outpatient Encounter Medications as of 08/22/2019  Medication Sig   acetaminophen (TYLENOL) 650 MG CR tablet Take 1,300 mg by mouth every 8 (eight) hours as needed for pain.   amiodarone (PACERONE) 200 MG tablet Take 1 tablet (200 mg total) by mouth daily.   apixaban (ELIQUIS) 5 MG TABS tablet Take 1 tablet (5 mg total) by mouth 2 (two) times daily.   carvedilol (COREG) 3.125 MG tablet Take 1 tablet (3.125 mg total) by mouth 2 (two) times daily with a meal.   coconut oil OIL Apply 1 application topically as needed (dry skin).    digoxin (LANOXIN) 0.125 MG tablet Take 0.125 mg by mouth daily.   potassium chloride SA (KLOR-CON) 20 MEQ tablet Take 1 tablet (20 mEq total) by mouth daily.   sacubitril-valsartan (ENTRESTO) 24-26 MG Take 1 tablet by mouth 2 (two) times daily.   spironolactone (ALDACTONE) 25 MG tablet Take 12.5 mg by mouth daily.   torsemide (DEMADEX) 20 MG tablet Take 1 tablet (20 mg total) by mouth 2 (two) times daily. Take an extra tablet as needed   No facility-administered encounter medications on file as of 08/22/2019.     Past Medical History:  Diagnosis Date   AICD (automatic cardioverter/defibrillator) present 08/2018   Arthritis    Chronic  combined systolic and diastolic CHF (congestive heart failure) (HCC)    Deep vein thrombosis (HCC)    Diverticulitis    Lupus anticoagulant positive    Morbid obesity (HCC)    Mural thrombus of heart    coumadin   Nonischemic cardiomyopathy (HCC)    a) 12/17/11 echo: LVEF 25-30%, grade 3 diastolic dysfunction (c/w restriction), mod MR, mod LA/LV and mild RA dilatation; b) 12/18/11 cMRI: LVEF 38%, mod LV/mild RV dilatation, global HK, mild-mod RV sys dysfxn, no LV thrombus & patchy non-subendocardial delayed enhancement c/w infil dz or prior myocarditis; c. 07/2018 EF 25-30%.   Paroxysmal SVT (supraventricular tachycardia) (HCC)    Pulmonary embolism (HCC)    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    Past Surgical History:  Procedure Laterality Date   A-FLUTTER ABLATION N/A 11/17/2016   Procedure: A-FLUTTER ABLATION;  Surgeon: Regan Lemming, MD;  [facility] INVASIVE CV LAB;  Service: Cardiovascular;  Laterality: N/A;   BIOPSY  12/23/2018   Procedure: BIOPSY;  Surgeon: Meridee Score Netty Starring., MD;  Location: [facility] ENDOSCOPY;  Service: Gastroenterology;;   CARDIAC CATHETERIZATION  06/2006   Angiographically normal cors   CARDIAC CATHETERIZATION  12/22/2011   R/LHC: normal cors, well-compensated HDs, LV dysfxn   CARDIOVERSION N/A 10/17/2016   Procedure: CARDIOVERSION;  Surgeon: Dolores Patty, MD;  Location: [facility] ENDOSCOPY;  Service: Cardiovascular;  Laterality: N/A;   CARDIOVERSION  N/A 03/20/2017   Procedure: CARDIOVERSION;  Surgeon: Dolores Patty, MD;  Location: [facility] ENDOSCOPY;  Service: Cardiovascular;  Laterality: N/A;   COLECTOMY N/A 12/28/2018   Procedure: TOTAL COLECTOMY;  Surgeon: Emelia Loron, MD;  Location: [facility] OR;  Service: General;  Laterality: N/A;   FLEXIBLE SIGMOIDOSCOPY N/A 12/23/2018   Procedure: Arnell Sieving;  Surgeon: Lemar Lofty., MD;  Location: [facility] ENDOSCOPY;  Service: Gastroenterology;  Laterality: N/A;   ICD IMPLANT N/A  09/03/2018   Procedure: ICD IMPLANT;  Surgeon: Regan Lemming, MD;  Location: [facility] INVASIVE CV LAB;  Service: Cardiovascular;  Laterality: N/A;   ILEOSTOMY N/A 12/28/2018   Procedure: ILEOSTOMY;  Surgeon: Emelia Loron, MD;  Location: [facility] OR;  Service: General;  Laterality: N/A;   IR RADIOLOGIST EVAL & MGMT  02/08/2019   LEFT AND RIGHT HEART CATHETERIZATION WITH CORONARY ANGIOGRAM N/A 12/22/2011   Procedure: LEFT AND RIGHT HEART CATHETERIZATION WITH CORONARY ANGIOGRAM;  Surgeon: Dolores Patty, MD;  Location: [facility] CATH LAB;  Service: Cardiovascular;  Laterality: N/A;   PATELLAR TENDON REPAIR     Left   RIGHT HEART CATH N/A 10/20/2016   Procedure: RIGHT HEART CATH;  Surgeon: Dolores Patty, MD;  Location: [facility] INVASIVE CV LAB;  Service: Cardiovascular;  Laterality: N/A;   TEE WITHOUT CARDIOVERSION N/A 03/20/2017   Procedure: TRANSESOPHAGEAL ECHOCARDIOGRAM (TEE);  Surgeon: Dolores Patty, MD;  Location: [facility] ENDOSCOPY;  Service: Cardiovascular;  Laterality: N/A;    Family History  Problem Relation Age of Onset   Hypertension Mother    Clotting disorder Mother        mom with PEs   Cancer Mother        uterine cancer   Diabetes Father    Heart attack Father    Hypertension Other        Sibling   Diabetes Other    Obesity Other    Diabetes Sister    Obesity Sister    Obesity Sister    Colon cancer Neg Hx    Stomach cancer Neg Hx    Esophageal cancer Neg Hx    Rectal cancer Neg Hx    Liver cancer Neg Hx     Social History   Social History Narrative   Lives in [place]   Separated 03/2015, 5 children.   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  Exercises with weights and some walking. 12/2015     Review of Systems General: Denies fevers, chills, weight loss CV: Denies chest pain, shortness of breath, palpitations  Physical Exam Vitals with BMI 08/22/2019 08/01/2019 07/11/2019  Height - - -  Weight - - -  BMI - - -  Systolic 106  104 101  Diastolic 66 70 63  Pulse 75 69 71    General:  No acute distress,  Alert and oriented, Non-Toxic, Normal speech and affect Abdomen: He has a midline abdominal wound that is been healing from the edges.  Current open area is probably 10 cm in greatest dimension.  There is surrounding scar tissue suggestive of progressive epithelialization from the borders.  He has a right lower quadrant end ileostomy.  The wound bed itself looks healthy with granulation tissue present.  Assessment/Plan Patient presents about 9 months out from a subtotal colectomy with end ileostomy.  This was due to a left colonic stricture.  He had a wound dehiscence afterwards and has an open abdominal wound.  I think he is a reasonably good candidate for skin graft.  I explained that the main risk for him would be the risk of anesthesia given his heart conditions.  We will plan to get clearance from his PCP and cardiologist.  He will pause his blood thinner a day or 2 before surgery according to their protocols.  I explained I would place a wound VAC after the skin graft was in place to keep it adherent to the wound bed.  He is fully understanding and will try to get this scheduled for him.  Allena Napoleon 08/22/2019, 3:28 PM

## 2019-08-25 NOTE — Progress Notes (Signed)
   ICD-10-CM   1. Abdominal wound dehiscence, subsequent encounter  T81.30XD       Patient ID: Roberto [name], male    DOB: 05/06/1965, 54 y.o.   MRN: [id number]   History of Present Illness: Roberto [name] is a 54 y.o.  male  with a history of chronic abdominal wound.  He presents for preoperative evaluation for upcoming procedure, split thickness skin graft to abdomen with placement of wound VAC, scheduled for 09/05/2019 with Dr. Pace.  Summary from previous visit: Patient has a chronic abdominal wound.  He had issues with diverticulitis that required an abdominal operation with diverting ostomy approximately 9 months ago..  He had incisional dehiscence at the midline.  Conservative measures to heal the wound have been attempted for a few months but have stalled.  PMH Significant for: AICD ( Automatic cardioverter/defibrillator), CHF, paroxysmal atrial fibrillation, Hx of LV mural thrombus, typical atrial flutter s/p catheter ablation, supra ventricular tachycardia, nonischemic cardiomyopathy, venous insufficiency, Hx DVT and PE, lupus anticoagulant positive  The patient has not had problems with anesthesia.   Past Medical History: Allergies: Allergies  Allergen Reactions  . Shrimp [Shellfish Allergy] Hives and Itching    Current Medications:  Current Outpatient Medications:  .  acetaminophen (TYLENOL) 500 MG tablet, Take 1 tablet (500 mg total) by mouth every 6 (six) hours as needed. For use AFTER surgery, Disp: 30 tablet, Rfl: 0 .  acetaminophen (TYLENOL) 650 MG CR tablet, Take 1,300 mg by mouth every 8 (eight) hours as needed for pain., Disp: , Rfl:  .  amiodarone (PACERONE) 200 MG tablet, Take 1 tablet (200 mg total) by mouth daily., Disp: 30 tablet, Rfl: 5 .  apixaban (ELIQUIS) 5 MG TABS tablet, Take 1 tablet (5 mg total) by mouth 2 (two) times daily., Disp: 60 tablet, Rfl: 5 .  carvedilol (COREG) 3.125 MG tablet, Take 1 tablet (3.125 mg total) by mouth 2 (two) times daily with a  meal., Disp: 60 tablet, Rfl: 3 .  coconut oil OIL, Apply 1 application topically as needed (dry skin). , Disp: , Rfl:  .  digoxin (LANOXIN) 0.125 MG tablet, Take 0.125 mg by mouth daily., Disp: , Rfl:  .  HYDROcodone-acetaminophen (NORCO) 5-325 MG tablet, Take 1 tablet by mouth every 8 (eight) hours as needed for up to 7 days for severe pain. For use AFTER Surgery, Disp: 21 tablet, Rfl: 0 .  potassium chloride SA (KLOR-CON) 20 MEQ tablet, Take 1 tablet (20 mEq total) by mouth daily., Disp: 30 tablet, Rfl: 5 .  sacubitril-valsartan (ENTRESTO) 24-26 MG, Take 1 tablet by mouth 2 (two) times daily., Disp: 60 tablet, Rfl: 5 .  spironolactone (ALDACTONE) 25 MG tablet, Take 12.5 mg by mouth daily., Disp: , Rfl:  .  torsemide (DEMADEX) 20 MG tablet, Take 1 tablet (20 mg total) by mouth 2 (two) times daily. Take an extra tablet as needed, Disp: 75 tablet, Rfl: 5  Past Medical Problems: Past Medical History:  Diagnosis Date  . AICD (automatic cardioverter/defibrillator) present 08/2018  . Arthritis   . Chronic combined systolic and diastolic CHF (congestive heart failure) (HCC)   . Deep vein thrombosis (HCC)   . Diverticulitis   . Lupus anticoagulant positive   . Morbid obesity (HCC)   . Mural thrombus of heart    coumadin  . Nonischemic cardiomyopathy (HCC)    a) 12/17/11 echo: LVEF 25-30%, grade 3 diastolic dysfunction (c/w restriction), mod MR, mod LA/LV and mild RA dilatation; b) 12/18/11 cMRI:   LVEF 38%, mod LV/mild RV dilatation, global HK, mild-mod RV sys dysfxn, no LV thrombus & patchy non-subendocardial delayed enhancement c/w infil dz or prior myocarditis; c. 07/2018 EF 25-30%.  . Paroxysmal SVT (supraventricular tachycardia) (HCC)   . Pulmonary embolism (HCC)    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    Past Surgical History: Past Surgical History:  Procedure Laterality Date  . A-FLUTTER ABLATION N/A 11/17/2016   Procedure: A-FLUTTER ABLATION;  Surgeon: Regan Lemming, MD;  Location:  [facility] INVASIVE CV LAB;  Service: Cardiovascular;  Laterality: N/A;  . BIOPSY  12/23/2018   Procedure: BIOPSY;  Surgeon: Meridee Score Netty Starring., MD;  Location: [facility] ENDOSCOPY;  Service: Gastroenterology;;  . CARDIAC CATHETERIZATION  06/2006   Angiographically normal cors  . CARDIAC CATHETERIZATION  12/22/2011   R/LHC: normal cors, well-compensated HDs, LV dysfxn  . CARDIOVERSION N/A 10/17/2016   Procedure: CARDIOVERSION;  Surgeon: Dolores Patty, MD;  Location: [facility] ENDOSCOPY;  Service: Cardiovascular;  Laterality: N/A;  . CARDIOVERSION N/A 03/20/2017   Procedure: CARDIOVERSION;  Surgeon: Dolores Patty, MD;  Location: [facility] ENDOSCOPY;  Service: Cardiovascular;  Laterality: N/A;  . COLECTOMY N/A 12/28/2018   Procedure: TOTAL COLECTOMY;  Surgeon: Emelia Loron, MD;  Location: [facility] OR;  Service: General;  Laterality: N/A;  . FLEXIBLE SIGMOIDOSCOPY N/A 12/23/2018   Procedure: FLEXIBLE SIGMOIDOSCOPY;  Surgeon: Lemar Lofty., MD;  Location: [facility] ENDOSCOPY;  Service: Gastroenterology;  Laterality: N/A;  . ICD IMPLANT N/A 09/03/2018   Procedure: ICD IMPLANT;  Surgeon: Regan Lemming, MD;  Location: [facility] INVASIVE CV LAB;  Service: Cardiovascular;  Laterality: N/A;  . ILEOSTOMY N/A 12/28/2018   Procedure: ILEOSTOMY;  Surgeon: Emelia Loron, MD;  Location: [facility] OR;  Service: General;  Laterality: N/A;  . IR RADIOLOGIST EVAL & MGMT  02/08/2019  . LEFT AND RIGHT HEART CATHETERIZATION WITH CORONARY ANGIOGRAM N/A 12/22/2011   Procedure: LEFT AND RIGHT HEART CATHETERIZATION WITH CORONARY ANGIOGRAM;  Surgeon: Dolores Patty, MD;  Location: [facility] CATH LAB;  Service: Cardiovascular;  Laterality: N/A;  . PATELLAR TENDON REPAIR     Left  . RIGHT HEART CATH N/A 10/20/2016   Procedure: RIGHT HEART CATH;  Surgeon: Dolores Patty, MD;  Location: [facility] INVASIVE CV LAB;  Service: Cardiovascular;  Laterality: N/A;  . TEE WITHOUT CARDIOVERSION N/A 03/20/2017   Procedure: TRANSESOPHAGEAL ECHOCARDIOGRAM  (TEE);  Surgeon: Dolores Patty, MD;  Location: [facility] ENDOSCOPY;  Service: Cardiovascular;  Laterality: N/A;    Social History: Social History   Socioeconomic History  . Marital status: Divorced    Spouse name: Not on file  . Number of children: 5  . Years of education: Not on file  . Highest education level: Not on file  Occupational History  . 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 Smoking status: Never Smoker  . Smokeless tobacco: Never Used  Vaping Use  . Vaping Use: Never used  Substance and Sexual Activity  . Alcohol use: No    Comment: Rarely  . Drug use: No  . Sexual activity: Not on file  Other Topics Concern  . Not on file  Social History Narrative   Lives in [place] 03/2015, 5 children.   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  Exercises with weights and some walking. 12/2015   Social Determinants of Health   Financial Resource Strain:   . Difficulty of Paying  Living Expenses:   Food Insecurity:   . Worried About Programme researcher, broadcasting/film/video in the Last Year:   . Barista in the Last Year:   Transportation Needs:   . Freight forwarder (Medical):   [name] Lack of Transportation (Non-Medical):   Physical Activity:   . Days of Exercise per Week:   . Minutes of Exercise per Session:   Stress:   . Feeling of Stress :   Social Connections:   . Frequency of Communication with Friends and Family:   . Frequency of Social Gatherings with Friends and Family:   . Attends Religious Services:   . Active Member of Clubs or Organizations:   . Attends Banker Meetings:   [name] Marital Status:   Intimate Partner Violence:   . Fear of Current or Ex-Partner:   . Emotionally Abused:   [name] Physically Abused:   . Sexually Abused:     Family History: Family History  Problem Relation Age of Onset  . Hypertension Mother   . Clotting disorder Mother         mom with PEs  . Cancer Mother        uterine cancer  . Diabetes Father   . Heart attack Father   . Hypertension Other        Sibling  . Diabetes Other   . Obesity Other   . Diabetes Sister   . Obesity Sister   . Obesity Sister   . Colon cancer Neg Hx   . Stomach cancer Neg Hx   . Esophageal cancer Neg Hx   . Rectal cancer Neg Hx   . Liver cancer Neg Hx     Review of Systems: Review of Systems  Constitutional: Negative for chills and fever.  HENT: Negative for congestion and sore throat.   Respiratory: Negative for cough and shortness of breath.   Cardiovascular: Negative for chest pain and palpitations.  Gastrointestinal: Negative for abdominal pain, nausea and vomiting.  Skin: Negative for itching and rash.    Physical Exam: Vital Signs BP 95/63 (BP Location: Left Wrist, Patient Position: Sitting, Cuff Size: Normal)   Pulse 72   Temp 98.5 F (36.9 C) (Oral)   Ht 5\' 8"  (1.727 m)   Wt (!) 334 lb 6.4 oz (151.7 kg)   SpO2 95%   BMI 50.85 kg/m  Physical Exam Vitals and nursing note reviewed.  Constitutional:      General: He is not in acute distress.    Appearance: Normal appearance. He is obese. He is not ill-appearing.  HENT:     Head: Normocephalic and atraumatic.  Eyes:     Extraocular Movements: Extraocular movements intact.  Cardiovascular:     Rate and Rhythm: Normal rate.  Pulmonary:     Effort: Pulmonary effort is normal.  Abdominal:     General: Bowel sounds are normal.     Palpations: Abdomen is soft.  Musculoskeletal:        General: Normal range of motion.     Cervical back: Normal range of motion.  Skin:    General: Skin is warm and dry.  Neurological:     General: No focal deficit present.     Mental Status: He is alert and oriented to person, place, and time.  Psychiatric:        Mood and Affect: Mood normal.        Behavior: Behavior normal.        Thought Content: Thought content  normal.        Judgment: Judgment normal.      Assessment/Plan:  Mr. Marijo File scheduled for split thickness skin graft to abdomen with wound VAC with Dr. Arita Miss.  Risks, benefits, and alternatives of procedure discussed, questions answered and consent obtained.    Smoking Status: non-smoker; Counseling Given? N/A  Caprini Score: 9 High; Risk Factors include: 54 year old male, Hx DVT and PE, visible varicose veins, current swollen legs, BMI > 25, and length of planned surgery. Recommendation for mechanical and pharmacological prophylaxis during surgery. Encourage early ambulation.   Surgical clearance request sent the PCP and Cardiology (request guidance on stopping Eliquis) PAT to send request for guidance on handling AICD.  Pictures obtained: 08/22/19  Post-op Rx sent to pharmacy: Norco, Tylenol 500 mg  Patient was provided with the General Surgical Risk consent document and Pain Medication Agreement prior to their appointment.  They had adequate time to read through the risk consent documents and Pain Medication Agreement. We also discussed them in person together during this preop appointment. All of their questions were answered to their satisfaction.  Recommended calling if they have any further questions.  Risk consent form and Pain Medication Agreement to be scanned into patient's chart.  The 21st Century Cures Act was signed into law in 2016 which includes the topic of electronic health records.  This provides immediate access to information in MyChart.  This includes consultation notes, operative notes, office notes, lab results and pathology reports.  If you have any questions about what you read please let us know at your next visit or call us at the office.  We are right here with you.   Electronically signed by: Eldridge Abrahams, PA-C 08/26/2019 1:00 PM

## 2019-08-25 NOTE — H&P (View-Only) (Signed)
ICD-10-CM   1. Abdominal wound dehiscence, subsequent encounter  T81.30XD       Patient ID: Roberto [name], male    DOB: 06-07-1965, 54 y.o.   MRN: [id number]   History of Present Illness: Roberto [name] is a 54 y.o.  male  with a history of chronic abdominal wound.  He presents for preoperative evaluation for upcoming procedure, split thickness skin graft to abdomen with placement of wound VAC, scheduled for 09/05/2019 with Dr. Arita Miss.  Summary from previous visit: Patient has a chronic abdominal wound.  He had issues with diverticulitis that required an abdominal operation with diverting ostomy approximately 9 months ago.[name]  He had incisional dehiscence at the midline.  Conservative measures to heal the wound have been attempted for a few months but have stalled.  PMH Significant for: AICD ( Automatic cardioverter/defibrillator), CHF, paroxysmal atrial fibrillation, Hx of LV mural thrombus, typical atrial flutter s/p catheter ablation, supra ventricular tachycardia, nonischemic cardiomyopathy, venous insufficiency, Hx DVT and PE, lupus anticoagulant positive  The patient has not had problems with anesthesia.   Past Medical History: Allergies: Allergies  Allergen Reactions  . Shrimp [Shellfish Allergy] Hives and Itching    Current Medications:  Current Outpatient Medications:  .  acetaminophen (TYLENOL) 500 MG tablet, Take 1 tablet (500 mg total) by mouth every 6 (six) hours as needed. For use AFTER surgery, Disp: 30 tablet, Rfl: 0 .  acetaminophen (TYLENOL) 650 MG CR tablet, Take 1,300 mg by mouth every 8 (eight) hours as needed for pain., Disp: , Rfl:  .  amiodarone (PACERONE) 200 MG tablet, Take 1 tablet (200 mg total) by mouth daily., Disp: 30 tablet, Rfl: 5 .  apixaban (ELIQUIS) 5 MG TABS tablet, Take 1 tablet (5 mg total) by mouth 2 (two) times daily., Disp: 60 tablet, Rfl: 5 .  carvedilol (COREG) 3.125 MG tablet, Take 1 tablet (3.125 mg total) by mouth 2 (two) times daily with a  meal., Disp: 60 tablet, Rfl: 3 .  coconut oil OIL, Apply 1 application topically as needed (dry skin). , Disp: , Rfl:  .  digoxin (LANOXIN) 0.125 MG tablet, Take 0.125 mg by mouth daily., Disp: , Rfl:  .  HYDROcodone-acetaminophen (NORCO) 5-325 MG tablet, Take 1 tablet by mouth every 8 (eight) hours as needed for up to 7 days for severe pain. For use AFTER Surgery, Disp: 21 tablet, Rfl: 0 .  potassium chloride SA (KLOR-CON) 20 MEQ tablet, Take 1 tablet (20 mEq total) by mouth daily., Disp: 30 tablet, Rfl: 5 .  sacubitril-valsartan (ENTRESTO) 24-26 MG, Take 1 tablet by mouth 2 (two) times daily., Disp: 60 tablet, Rfl: 5 .  spironolactone (ALDACTONE) 25 MG tablet, Take 12.5 mg by mouth daily., Disp: , Rfl:  .  torsemide (DEMADEX) 20 MG tablet, Take 1 tablet (20 mg total) by mouth 2 (two) times daily. Take an extra tablet as needed, Disp: 75 tablet, Rfl: 5  Past Medical Problems: Past Medical History:  Diagnosis Date  . AICD (automatic cardioverter/defibrillator) present 08/2018  . Arthritis   . Chronic combined systolic and diastolic CHF (congestive heart failure) (HCC)   . Deep vein thrombosis (HCC)   . Diverticulitis   . Lupus anticoagulant positive   . Morbid obesity (HCC)   . Mural thrombus of heart    coumadin  . Nonischemic cardiomyopathy (HCC)    a) 12/17/11 echo: LVEF 25-30%, grade 3 diastolic dysfunction (c/w restriction), mod MR, mod LA/LV and mild RA dilatation; b) 12/18/11 cMRI:  LVEF 38%, mod LV/mild RV dilatation, global HK, mild-mod RV sys dysfxn, no LV thrombus & patchy non-subendocardial delayed enhancement c/w infil dz or prior myocarditis; c. 07/2018 EF 25-30%.  . Paroxysmal SVT (supraventricular tachycardia) (HCC)   . Pulmonary embolism (HCC)    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    Past Surgical History: Past Surgical History:  Procedure Laterality Date  . A-FLUTTER ABLATION N/A 11/17/2016   Procedure: A-FLUTTER ABLATION;  Surgeon: Regan Lemming, MD;  Location:  [facility] INVASIVE CV LAB;  Service: Cardiovascular;  Laterality: N/A;  . BIOPSY  12/23/2018   Procedure: BIOPSY;  Surgeon: Meridee Score Netty Starring., MD;  Location: [facility] ENDOSCOPY;  Service: Gastroenterology;;  . CARDIAC CATHETERIZATION  06/2006   Angiographically normal cors  . CARDIAC CATHETERIZATION  12/22/2011   R/LHC: normal cors, well-compensated HDs, LV dysfxn  . CARDIOVERSION N/A 10/17/2016   Procedure: CARDIOVERSION;  Surgeon: Dolores Patty, MD;  Location: [facility] ENDOSCOPY;  Service: Cardiovascular;  Laterality: N/A;  . CARDIOVERSION N/A 03/20/2017   Procedure: CARDIOVERSION;  Surgeon: Dolores Patty, MD;  Location: [facility] ENDOSCOPY;  Service: Cardiovascular;  Laterality: N/A;  . COLECTOMY N/A 12/28/2018   Procedure: TOTAL COLECTOMY;  Surgeon: Emelia Loron, MD;  Location: [facility] OR;  Service: General;  Laterality: N/A;  . FLEXIBLE SIGMOIDOSCOPY N/A 12/23/2018   Procedure: FLEXIBLE SIGMOIDOSCOPY;  Surgeon: Lemar Lofty., MD;  Location: [facility] ENDOSCOPY;  Service: Gastroenterology;  Laterality: N/A;  . ICD IMPLANT N/A 09/03/2018   Procedure: ICD IMPLANT;  Surgeon: Regan Lemming, MD;  Location: [facility] INVASIVE CV LAB;  Service: Cardiovascular;  Laterality: N/A;  . ILEOSTOMY N/A 12/28/2018   Procedure: ILEOSTOMY;  Surgeon: Emelia Loron, MD;  Location[facility] OR;  Service: General;  Laterality: N/A;  . IR RADIOLOGIST EVAL & MGMT  02/08/2019  . LEFT AND RIGHT HEART CATHETERIZATION WITH CORONARY ANGIOGRAM N/A 12/22/2011   Procedure: LEFT AND RIGHT HEART CATHETERIZATION WITH CORONARY ANGIOGRAM;  Surgeon: Dolores Patty, MD;  Location[facility] CATH LAB;  Service: Cardiovascular;  Laterality: N/A;  . PATELLAR TENDON REPAIR     Left  . RIGHT HEART CATH N/A 10/20/2016   Procedure: RIGHT HEART CATH;  Surgeon: Dolores Patty, MD;  Location: [facility] INVASIVE CV LAB;  Service: Cardiovascular;  Laterality: N/A;  . TEE WITHOUT CARDIOVERSION N/A 03/20/2017   Procedure: TRANSESOPHAGEAL ECHOCARDIOGRAM  (TEE);  Surgeon: Dolores Patty, MD;  Location: [facility] ENDOSCOPY;  Service: Cardiovascular;  Laterality: N/A;    Social History: Social History   Socioeconomic History  . Marital status: Divorced    Spouse name: Not on [name]  . Number of children: 5  . Years of education: Not on [name]  . Highest education level: Not on [name]  Occupational History  . 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 Smoking status: Never Smoker  . Smokeless tobacco: Never Used  Vaping Use  . Vaping Use: Never used  Substance and Sexual Activity  . Alcohol use: No    Comment: Rarely  . Drug use: No  . Sexual activity: Not on [name]  Other Topics Concern  . Not on [name]  Social History Narrative   Lives in [place] 03/2015, 5 children.   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  Exercises with weights and some walking. 12/2015   Social Determinants of Health   Financial Resource Strain:   . Difficulty of Paying  Living Expenses:   Food Insecurity:   . Worried About Programme researcher, broadcasting/film/video in the Last Year:   . Barista in the Last Year:   Transportation Needs:   . Freight forwarder (Medical):   [name] Lack of Transportation (Non-Medical):   Physical Activity:   . Days of Exercise per Week:   . Minutes of Exercise per Session:   Stress:   . Feeling of Stress :   Social Connections:   . Frequency of Communication with Friends and Family:   . Frequency of Social Gatherings with Friends and Family:   . Attends Religious Services:   . Active Member of Clubs or Organizations:   . Attends Banker Meetings:   [name] Marital Status:   Intimate Partner Violence:   . Fear of Current or Ex-Partner:   . Emotionally Abused:   [name] Physically Abused:   . Sexually Abused:     Family History: Family History  Problem Relation Age of Onset  . Hypertension Mother   . Clotting disorder Mother         mom with PEs  . Cancer Mother        uterine cancer  . Diabetes Father   . Heart attack Father   . Hypertension Other        Sibling  . Diabetes Other   . Obesity Other   . Diabetes Sister   . Obesity Sister   . Obesity Sister   . Colon cancer Neg Hx   . Stomach cancer Neg Hx   . Esophageal cancer Neg Hx   . Rectal cancer Neg Hx   . Liver cancer Neg Hx     Review of Systems: Review of Systems  Constitutional: Negative for chills and fever.  HENT: Negative for congestion and sore throat.   Respiratory: Negative for cough and shortness of breath.   Cardiovascular: Negative for chest pain and palpitations.  Gastrointestinal: Negative for abdominal pain, nausea and vomiting.  Skin: Negative for itching and rash.    Physical Exam: Vital Signs BP 95/63 (BP Location: Left Wrist, Patient Position: Sitting, Cuff Size: Normal)   Pulse 72   Temp 98.5 F (36.9 C) (Oral)   Ht 5\' 8"  (1.727 m)   Wt (!) 334 lb 6.4 oz (151.7 kg)   SpO2 95%   BMI 50.85 kg/m  Physical Exam Vitals and nursing note reviewed.  Constitutional:      General: He is not in acute distress.    Appearance: Normal appearance. He is obese. He is not ill-appearing.  HENT:     Head: Normocephalic and atraumatic.  Eyes:     Extraocular Movements: Extraocular movements intact.  Cardiovascular:     Rate and Rhythm: Normal rate.  Pulmonary:     Effort: Pulmonary effort is normal.  Abdominal:     General: Bowel sounds are normal.     Palpations: Abdomen is soft.  Musculoskeletal:        General: Normal range of motion.     Cervical back: Normal range of motion.  Skin:    General: Skin is warm and dry.  Neurological:     General: No focal deficit present.     Mental Status: He is alert and oriented to person, place, and time.  Psychiatric:        Mood and Affect: Mood normal.        Behavior: Behavior normal.        Thought Content: Thought content  normal.        Judgment: Judgment normal.      Assessment/Plan:  Roberto [name] scheduled for split thickness skin graft to abdomen with wound VAC with Dr. Arita Miss.  Risks, benefits, and alternatives of procedure discussed, questions answered and consent obtained.    Smoking Status: non-smoker; Counseling Given? N/A  Caprini Score: 9 High; Risk Factors include: 54 year old male, Hx DVT and PE, visible varicose veins, current swollen legs, BMI > 25, and length of planned surgery. Recommendation for mechanical and pharmacological prophylaxis during surgery. Encourage early ambulation.   Surgical clearance request sent the PCP and Cardiology (request guidance on stopping Eliquis) PAT to send request for guidance on handling AICD.  Pictures obtained: 08/22/19  Post-op Rx sent to pharmacy: Norco, Tylenol 500 mg  Patient was provided with the General Surgical Risk consent document and Pain Medication Agreement prior to their appointment.  They had adequate time to read through the risk consent documents and Pain Medication Agreement. We also discussed them in person together during this preop appointment. All of their questions were answered to their satisfaction.  Recommended calling if they have any further questions.  Risk consent form and Pain Medication Agreement to be scanned into patient's chart.  The 21st Century Cures Act was signed into law in 2016 which includes the topic of electronic health records.  This provides immediate access to information in MyChart.  This includes consultation notes, operative notes, office notes, lab results and pathology reports.  If you have any questions about what you read please let us know at your next visit or call us at the office.  We are right here with you.   Electronically signed by: Eldridge Abrahams, PA-C 08/26/2019 1:00 PM

## 2019-08-26 ENCOUNTER — Encounter: Payer: Self-pay | Admitting: Plastic Surgery

## 2019-08-26 ENCOUNTER — Ambulatory Visit (INDEPENDENT_AMBULATORY_CARE_PROVIDER_SITE_OTHER): Payer: BC Managed Care – PPO | Admitting: Plastic Surgery

## 2019-08-26 ENCOUNTER — Other Ambulatory Visit: Payer: Self-pay

## 2019-08-26 ENCOUNTER — Telehealth: Payer: Self-pay

## 2019-08-26 VITALS — BP 95/63 | HR 72 | Temp 98.5°F | Ht 68.0 in | Wt 334.4 lb

## 2019-08-26 DIAGNOSIS — T8130XD Disruption of wound, unspecified, subsequent encounter: Secondary | ICD-10-CM

## 2019-08-26 MED ORDER — ACETAMINOPHEN 500 MG PO TABS
500.0000 mg | ORAL_TABLET | Freq: Four times a day (QID) | ORAL | 0 refills | Status: DC | PRN
Start: 2019-08-26 — End: 2020-01-23

## 2019-08-26 MED ORDER — HYDROCODONE-ACETAMINOPHEN 5-325 MG PO TABS: 1.0000 | ORAL_TABLET | Freq: Three times a day (TID) | ORAL | 0 refills | Status: AC | PRN

## 2019-08-26 NOTE — Telephone Encounter (Signed)
Order for wound vac faxed to [facility] Call to [facility]: no answer- left v/m requesting her to arrange to be here during the pt's 1st post-op visit on 09/15/19- to educate/train pt &/or family member to change wound vac dressing. Pt agrees with this plan- but he understands that if Dr. Arita Miss decides that the wound vac does not need to be used long term- that we can cancel this training session. I informed the pt that wound vac supplies will be delivered to his house & I instructed him to bring (2) dressings/ (2) canisters & the vac machine/plug & charger with him to surgery for placement.  Copy of orders scanned into chart

## 2019-08-29 ENCOUNTER — Telehealth: Payer: Self-pay

## 2019-08-29 NOTE — Telephone Encounter (Signed)
Orders for wound/dressing supplies faxed to [facility] per [place], [place] Copy scanned into chart

## 2019-08-29 NOTE — Telephone Encounter (Signed)
Order status from [facility] received as follows: [facility] has provided service to pt/no further action is needed Copy scanned into chart

## 2019-08-29 NOTE — Telephone Encounter (Signed)
Call from [place] with [facility] re: the order for [employer]- wound/vac Debbe Odea states that [facility] can only authorize the wound vac for 1 week at a time I faxed the request  on 08/26/19 for the wound vac to be in place for approx. 1 month Dr. Arita Miss will remove the vac on pt's 1st post-op visit on 09/15/19- and he will evaluate wound/skin graft at that time & will determine how long the vac will be used post-op I called Dawn- with [facility] & informed her of the above info & she will contact Latisha for further clarification I gave her Latisha's ph# [phone] & fax# [phone] [facility] will call back if any further assistance is needed from our office

## 2019-08-31 ENCOUNTER — Telehealth: Payer: Self-pay

## 2019-08-31 NOTE — Telephone Encounter (Signed)
Patient called requesting update on wound vac as he has not yet received it.

## 2019-08-31 NOTE — Telephone Encounter (Signed)
Call to [facility] with [facility]- ph#[phone] no answer-left v/m requesting update on wound vac/order status  I informed her that pt is inquiring about the status

## 2019-09-01 ENCOUNTER — Telehealth: Payer: Self-pay

## 2019-09-01 ENCOUNTER — Telehealth: Payer: Self-pay | Admitting: Surgical

## 2019-09-01 ENCOUNTER — Encounter (HOSPITAL_COMMUNITY): Payer: Self-pay | Admitting: Plastic Surgery

## 2019-09-01 ENCOUNTER — Other Ambulatory Visit (HOSPITAL_COMMUNITY)
Admission: RE | Admit: 2019-09-01 | Discharge: 2019-09-01 | Disposition: A | Discharge status: Home / Self Care | Payer: BC Managed Care – PPO | Source: Ambulatory Visit | Attending: Plastic Surgery | Admitting: Plastic Surgery

## 2019-09-01 ENCOUNTER — Telehealth: Payer: Self-pay | Admitting: Cardiology

## 2019-09-01 ENCOUNTER — Other Ambulatory Visit: Payer: Self-pay

## 2019-09-01 ENCOUNTER — Encounter: Payer: Self-pay | Admitting: Cardiology

## 2019-09-01 ENCOUNTER — Encounter: Payer: Self-pay | Admitting: Family Medicine

## 2019-09-01 ENCOUNTER — Ambulatory Visit: Payer: BC Managed Care – PPO | Admitting: Family Medicine

## 2019-09-01 VITALS — BP 96/64 | HR 72 | Temp 97.7°F | Wt 336.0 lb

## 2019-09-01 DIAGNOSIS — T8130XA Disruption of wound, unspecified, initial encounter: Secondary | ICD-10-CM

## 2019-09-01 DIAGNOSIS — I5022 Chronic systolic (congestive) heart failure: Secondary | ICD-10-CM

## 2019-09-01 DIAGNOSIS — R7301 Impaired fasting glucose: Secondary | ICD-10-CM | POA: Diagnosis not present

## 2019-09-01 DIAGNOSIS — Z20822 Contact with and (suspected) exposure to covid-19: Secondary | ICD-10-CM | POA: Diagnosis not present

## 2019-09-01 DIAGNOSIS — Z01812 Encounter for preprocedural laboratory examination: Secondary | ICD-10-CM | POA: Diagnosis not present

## 2019-09-01 DIAGNOSIS — I480 Paroxysmal atrial fibrillation: Secondary | ICD-10-CM | POA: Diagnosis not present

## 2019-09-01 DIAGNOSIS — Z01818 Encounter for other preprocedural examination: Secondary | ICD-10-CM

## 2019-09-01 LAB — SARS CORONAVIRUS 2 (TAT 6-24 HRS): SARS Coronavirus 2: NEGATIVE

## 2019-09-01 LAB — POCT GLYCOSYLATED HEMOGLOBIN (HGB A1C): Hemoglobin A1C: 6.1 % — AB (ref 4.0–5.6)

## 2019-09-01 MED ORDER — APIXABAN 5 MG PO TABS: 5.0000 mg | ORAL_TABLET | Freq: Two times a day (BID) | ORAL | 5 refills | Status: DC

## 2019-09-01 NOTE — Telephone Encounter (Signed)
   [facility] Pre-operative Risk Assessment    HEARTCARE STAFF: - Please ensure there is not already an duplicate clearance open for this procedure. - Under Visit Info/Reason for Call, type in Other and utilize the format Clearance MM/DD/YY or Clearance TBD. Do not use dashes or single digits. - If request is for dental extraction, please clarify the # of teeth to be extracted.  Request for surgical clearance:  1. What type of surgery is being performed? Split thickness skin graft to the abdomen with placement of a wound VAC  2. When is this surgery scheduled? 09/05/19  3. What type of clearance is required (medical clearance vs. Pharmacy clearance to hold med vs. Both)? Medical  4. Are there any medications that need to be held prior to surgery and how long? Eliquis tbd by cardiology  5. Practice name and name of physician performing surgery? Dr. Mingo Amber , [facility]  6. What is the office phone number? [place].   What is the office fax number? [phone]   8.   Anesthesia type (None, local, MAC, general) ? general   Roberto [name] 09/01/2019, 9:50 AM  _________________________________________________________________   (provider comments below)

## 2019-09-01 NOTE — Progress Notes (Signed)
PERIOPERATIVE PRESCRIPTION FOR IMPLANTED CARDIAC DEVICE PROGRAMMING  Patient Information: Name:  Roberto [name]  DOB:  09-19-1965  MRN:  [id number]   Planned Procedure: Split-thickness skin graft to the abdomen  Surgeon: Dr. Merry Proud  Date of Procedure: Monday, 09/05/19  Cautery will be used.  Position during surgery:    Please send documentation back to:  [facility] (Fax # [phone])   Device Information:  Clinic EP Physician:  Loman Brooklyn, MD  Device Type:  Defibrillator Manufacturer and Phone #:  Medtronic: [phone] Pacemaker Dependent?:  No. Date of Last Device Check:  07/29/2019 Normal Device Function?:  Yes.    Electrophysiologist's Recommendations:   Have magnet available.  Provide continuous ECG monitoring when magnet is used or reprogramming is to be performed.   Procedure may interfere with device function.  Magnet should be placed over device during procedure.  Per [facility] [address] [place], [place]  6:05 PM 09/01/2019

## 2019-09-01 NOTE — Telephone Encounter (Addendum)
Patient with diagnosis of afib, LV mural thrombus, prior 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, and + lupus anticoagulant on Eliquis for anticoagulation.    Procedure: Split thickness skin graft to the abdomen with placement of a wound VAC Date of procedure: 09/05/19  CHADS2-VASc score of 4 (CHF, CAD, recurrent VTE).  CrCl 161mL/min using adjusted body weight Platelet count 197K  Per office protocol, patient can hold Eliquis for 1 day prior to procedure.

## 2019-09-01 NOTE — Telephone Encounter (Signed)
Call to pt to inform him of the status of wound vac- Pt understands that the wound vac order has been approved & delivery is scheduled for tomorrow Fri. 09/02/19 He is aware that [facility] delivery team will call him to schedule time for " signature required" delivery tomorrow Pt informed me that he saw his PCP- Dr. Susann Givens today & he was given " medical clearance" for upcoming surgery- but Dr. Susann Givens did state that he will forward a request for "cardiac clearance" from pt's Cardiologist- Dr. Adella Hare in regards to his Eliquis & any other meds that may need to be modified for surgery Pt did take Eliquis today- & I told him someone will instruct him on further medication change f needed I will forward this info to Dr. Dolores Lory & Osvaldo Human

## 2019-09-01 NOTE — Telephone Encounter (Signed)
I called the patient to discuss stopping Eliquis 1 day prior to procedure.  Patient did not answer.  I left a voicemail instructing him to call our office for updated information.

## 2019-09-01 NOTE — Progress Notes (Signed)
   Subjective:    Patient ID: Roberto [name], male    DOB: 1965-06-14, 54 y.o.   MRN: [id number]  HPI He is here for preoperative clearance.  He has had difficulty with wound healing from previous bowel obstruction and surgery.  He has been working with plastic surgery to help with closure and now surgery is needed to get better healing.  He has a history of atrial fibrillation and chronic CHF.  He is on multiple medications for that including Entresto and Eliquis.  He also has a history of glucose intolerance.   Review of Systems     Objective:   Physical Exam Alert and in no distress.  Cardiac exam difficult to do due to his large size but irregular rhythm was noted.  Lungs are clear to auscultation.  Hemoglobin A1c is 6.1.      Assessment & Plan:  Preoperative evaluation to rule out surgical contraindication  Chronic systolic heart failure (HCC)  PAF (paroxysmal atrial fibrillation) (HCC)  Abdominal wound dehiscence, initial encounter  Impaired fasting blood sugar - Plan: POCT glycosylated hemoglobin (Hb A1C) He is cleared for surgery pending cardiology clearance.

## 2019-09-01 NOTE — Telephone Encounter (Signed)
Called and spoke with patient, informed him to stop taking his Eliquis 1 day prior to procedure.  His last dose prior to surgery will be on Saturday, 09/03/2019.  Will provide patient with instructions on when to resume postoperatively.

## 2019-09-01 NOTE — Progress Notes (Signed)
Pt denies SOB and chest pain. Pt stated that he is under the care of Dr. Gala Romney, Cardiology and Dr. Gerre Pebbles, Cardiology (EP). Pt stated that a stress test was performed > 10 years ago. Peri-op Prescription for ICD was initiated; awaiting response. Medtronic Rep notified (per protocol). Pt denies recent labs. Pt made aware to stop taking vitamins, fish oil and herbal medications. Do not take any NSAIDs ie: Ibuprofen, Advil, Naproxen (Aleve), Motrin, BC and Goody Powder. Pt stated that he was instructed to take last dose of Eliquis on Saturday 09/03/19. Pt stated that he does not take Aspirin. Pt reminded to quarantine. Pt verbalized understanding of all pre-op instructions. PA, Anesthesiology, asked to review pt history.

## 2019-09-01 NOTE — Telephone Encounter (Signed)
Pharmacy, can you please comment on how long Eliquis can be held for upcoming procedure?  Thank you! 

## 2019-09-01 NOTE — Telephone Encounter (Signed)
Call to [facility]-ph# [phone]: to inquire about the status of wound vac order Spoke to Renz with  [facility]- he confirmed that the order for wound vac has been approved & it is scheduled to be delivered to pt's house tomorrow [facility] delivery team will call pt to scheduled the delivery- which will need to be signed for

## 2019-09-02 ENCOUNTER — Encounter: Payer: Self-pay | Admitting: Plastic Surgery

## 2019-09-02 MED ORDER — DEXTROSE 5 % IV SOLN
3.0000 g | INTRAVENOUS | Status: AC
  Administered 2019-09-05: 3 g via INTRAVENOUS
  Filled 2019-09-02: qty 3000
  Filled 2019-09-02: qty 3

## 2019-09-02 NOTE — Anesthesia Preprocedure Evaluation (Addendum)
Anesthesia Evaluation  Patient identified by MRN, date of birth, ID band Patient awake    Reviewed: Allergy & Precautions, NPO status , Patient's Chart, lab work & pertinent test results  History of Anesthesia Complications Negative for: history of anesthetic complications  Airway Mallampati: III  TM Distance: >3 FB Neck ROM: Full    Dental  (+) Missing,    Pulmonary sleep apnea ,    Pulmonary exam normal        Cardiovascular + CAD and +CHF  Normal cardiovascular exam+ dysrhythmias Atrial Fibrillation and Supra Ventricular Tachycardia + Cardiac Defibrillator   Medtronic ICD  Echo 07/20/18: EF 25-30%, LV moderately dilated, LV diffuse hypokinesis, mild LAE  RHC 10/20/16: Findings: RA = 3 RV = 45/7 PA = 49/20 (31) PCW = 28 Fick cardiac output/index = 5.0/2.1 PVR = 0.5 WU Ao sat = 95% PA sat = 64%, 61%  Normal coronaries [facility] 12/22/11    Neuro/Psych negative neurological ROS  negative psych ROS   GI/Hepatic negative GI ROS, Neg liver ROS,   Endo/Other  negative endocrine ROS  Renal/GU negative Renal ROS  negative genitourinary   Musculoskeletal  (+) Arthritis ,   Abdominal   Peds  Hematology negative hematology ROS (+)   Anesthesia Other Findings Abdominal wound dehiscence  Reproductive/Obstetrics negative OB ROS                           Anesthesia Physical Anesthesia Plan  ASA: III  Anesthesia Plan: General   Post-op Pain Management:    Induction: Intravenous  PONV Risk Score and Plan: 3 and Treatment may vary due to age or medical condition, Ondansetron, Dexamethasone and Midazolam  Airway Management Planned: Oral ETT  Additional Equipment:   Intra-op Plan:   Post-operative Plan: Extubation in OR  Informed Consent: I have reviewed the patients History and Physical, chart, labs and discussed the procedure including the risks, benefits and alternatives for the  proposed anesthesia with the patient or authorized representative who has indicated his/her understanding and acceptance.     Dental advisory given  Plan Discussed with: CRNA  Anesthesia Plan Comments: (PAT note written 09/02/2019 by Shonna Chock, PA-C. )      Anesthesia Quick Evaluation

## 2019-09-02 NOTE — Telephone Encounter (Signed)
Surgery is on Monday, attempted to reach the patient without success, left a voicemail with the clinical pharmacist's instruction. I will attempt to reach out to him tomorrow (Saturday) given the urgency of clearance to hopefully clear him for the procedure.

## 2019-09-02 NOTE — Progress Notes (Signed)
Anesthesia Chart Review: SAME DAY WORK-UP   Case: [id number] Date/Time: 09/05/19 1112   Procedures:      SKIN GRAFT SPLIT THICKNESS (N/A Abdomen) - 1 hour total, please     APPLICATION OF WOUND VAC (N/A )   Anesthesia type: General   Pre-op diagnosis: Abdominal wound dehiscence   [facility] / [facility]   Surgeons: Allena Napoleon, MD      DISCUSSION: Patient is a 54 year old male scheduled for the above procedure.  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  Patient was see by PCP [name], MD on 09/01/19 for preoperative evaluation. He wrote, "He is cleared for surgery pending cardiology clearance." Marjie Skiff, PA-C with [facility] reviewed and forwarded Eliquis instructions to Pharmacist Margaretmary Dys, RPH-CPP, "Per office protocol, patient can hold Eliquis for 1 day prior to procedure." Last preoperative Eliquis 09/03/2019.  EP Perioperative ICD management instructions: Device Information: Clinic EP Physician:  Loman Brooklyn, MD  Device Type:  Defibrillator Manufacturer and Phone #:  Medtronic: [phone] Pacemaker Dependent?:  No. Date of Last Device Check:  07/29/2019       Normal Device Function?:  Yes.    Electrophysiologist's Recommendations:  Have magnet available.  Provide continuous ECG monitoring when magnet is used or reprogramming is to be performed.   Procedure  may interfere with device function.  Magnet should be placed over device during procedure.  09/01/2019 presurgical COVID-19 test negative.  Anesthesia team to evaluate on the day of surgery.   VS:   Wt Readings from Last 3 Encounters:  09/01/19 (!) 152.4 kg  08/26/19 (!) 151.7 kg  05/30/19 (!) 144.7 kg   BP Readings from Last 3 Encounters:  09/01/19 96/64  08/26/19 95/63  08/22/19 106/66   Pulse Readings from Last 3 Encounters:  09/01/19 72  08/26/19 72  08/22/19 75    PROVIDERS: [name], MD is PCP  Arvilla Meres, MD is HF cardiologist. Last visit 03/31/19 with Robbie Lis, PA-C. Volume status looked "ok". Loman Brooklyn, MD is EP cardiologist. Last visit 12/03/18.  Emelia Loron, MD is general surgeon   LABS: For day of surgery. Currently last lab results include: Lab Results  Component Value Date   WBC 8.3 03/31/2019   HGB 11.8 (L) 03/31/2019   HCT 38.2 (L) 03/31/2019   PLT 197 03/31/2019   GLUCOSE 131 (H) 05/23/2019   ALT 13 05/23/2019   AST 17 05/23/2019   NA 140 05/23/2019   K 3.5 05/23/2019   CL 106 05/23/2019   CREATININE 1.21 05/23/2019   BUN 13 05/23/2019   CO2 23 05/23/2019   TSH 0.972 05/23/2019   HGBA1C 6.1 (A) 09/01/2019     OTHER: Split Night Sleep Study 12/08/08:  Mild obstructive sleep apnea, hypopnea syndrome with apnea-hypopnea index of 14 events per hour and oxygen desaturations of 84% during the diagnostic portion of the study.  He was then placed on continuous positive airway pressure with a large ResMed Quatro full facemask and ultimately titrated to 10 cm water for control of apneas and  hypopneas and 13 cm for cessation of snoring.   IMAGES: CT Abd/pelvis 02/08/19: IMPRESSION: 1. Interval reduction/resolution of air and fluid containing collection along the right mid hemiabdomen following percutaneous drainage catheter placement. No new definable/drainable fluid collections within the abdomen or pelvis. 2.  Redemonstrated dehiscence of midline abdominal incision with small amount of debris about the caudal aspect of the open incision without definable/drainable fluid collection. 3. Stable sequela of subtotal colectomy and end ileostomy without evidence of enteric obstruction. 4. Significant involution previously characterized hepatic hemangioma compared to remote examination performed 2010, currently measuring 3.7 cm, previously, 7.8 cm. PLAN: Patient subsequently underwent percutaneous drainage catheter injection  1V PCXR 01/19/19: IMPRESSION: 1. Well-positioned right-sided PICC line. 2. Cardiomegaly with vascular congestion. 3. Right lower lung zone opacity favored to represent a combination of both atelectasis and a small right-sided pleural effusion.   EKG: 03/31/19: Sinus rhythm with occasional Premature ventricular complexes Low voltage QRS Left anterior fascicular block Possible Anterolateral infarct , age undetermined Abnormal ECG Confirmed by Hillis Range ([id number]) on 04/01/2019 8:44:07 PM   CV: Echo 07/20/18: IMPRESSIONS  1. The left ventricle has severely reduced systolic function, with an  ejection fraction of 25-30%. The cavity size was moderately dilated. Left  ventricular diastolic Doppler parameters are consistent with  pseudonormalization. Left ventricular diffuse  hypokinesis.  2. The right ventricle has normal systolic function. The cavity was  normal. There is no increase in right ventricular wall thickness.  3. Left atrial size was mildly dilated.  4. No evidence of mitral valve stenosis. Trivial mitral regurgitation.  5. The aortic valve is tricuspid. No stenosis of the aortic valve.  6. The aortic root is normal in size and structure.  7. The inferior vena cava was dilated in size with <50% respiratory  variability. No complete TR doppler jet so unable to estimate PA systolic  pressure.    RHC 10/20/16: Findings: RA = 3 RV = 45/7 PA = 49/20  (31) PCW = 28 Fick cardiac output/index = 5.0/2.1 PVR = 0.5 WU Ao sat = 95% PA sat = 64%, 61% Assessment: 1. Mild to moderate volume overload with mildly depressed CO   RHC/LHC 12/22/11: Assessment: 1. Normal coronaries 2. Well compensated hemodynamics 3. Severe LV dysfunction by echo   MRI Cardiac 12/18/11: IMPRESSION:  1. Moderately dilated left ventricle with moderately decreased  systolic function, EF 38%. Global hypokinesis.  2. Mildly dilated right ventricle with mild to moderately  decreased systolic function.  3. No definite LV thrombus noted.  4. Patchy non-subendocardial delayed enhancement seen in the  ventricular septum (see above for description). This is not  suggestive of a coronary disease pattern. This could be suggestive  of infiltrative disease versus prior myocarditis.    Past Medical History:  Diagnosis Date  . AICD (automatic cardioverter/defibrillator) present 08/2018  . Arthritis   . Chronic combined systolic and diastolic CHF (congestive heart failure) (HCC)   . Coronary artery disease   . Deep vein thrombosis (HCC)   . Diverticulitis   . Hepatitis   . Lupus anticoagulant positive   . Morbid obesity (HCC)   . Morbid obesity with BMI of 50.0-59.9, adult (HCC)   . Mural thrombus of heart    coumadin  . Nonischemic cardiomyopathy (HCC)    a) 12/17/11 echo: LVEF 25-30%, grade 3 diastolic dysfunction (c/w restriction), mod MR, mod LA/LV and mild RA dilatation; b) 12/18/11 cMRI: LVEF 38%, mod LV/mild RV dilatation, global HK, mild-mod RV sys dysfxn, no LV thrombus &  patchy non-subendocardial delayed enhancement c/w infil dz or prior myocarditis; c. 07/2018 EF 25-30%.  . Paroxysmal SVT (supraventricular tachycardia) (HCC)   . Pneumonia   . Pulmonary embolism (HCC)    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  . Wears glasses   . Wound of abdomen     Past Surgical History:  Procedure Laterality Date  . A-FLUTTER ABLATION N/A 11/17/2016    Procedure: A-FLUTTER ABLATION;  Surgeon: Regan Lemming, MD;  [facility]AS[facility] CV LAB;  Service: Cardiovascular;  Laterality: N/A;  . BIOPSY  12/23/2018   Procedure: BIOPSY;  Surgeon: Meridee Score Netty Starring., MD;  Location: [facility] ENDOSCOPY;  Service: Gastroenterology;;  . CARDIAC CATHETERIZATION  06/2006   Angiographically normal cors  . CARDIAC CATHETERIZATION  12/22/2011   R/LHC: normal cors, well-compensated HDs, LV dysfxn  . CARDIOVERSION N/A 10/17/2016   Procedure: CARDIOVERSION;  Surgeon: Dolores Patty, MD;  Location: [facility] ENDOSCOPY;  Service: Cardiovascular;  Laterality: N/A;  . CARDIOVERSION N/A 03/20/2017   Procedure: CARDIOVERSION;  Surgeon: Dolores Patty, MD;  Location: [facility] ENDOSCOPY;  Service: Cardiovascular;  Laterality: N/A;  . COLECTOMY N/A 12/28/2018   Procedure: TOTAL COLECTOMY;  Surgeon: Emelia Loron, MD;  Location: [facility] OR;  Service: General;  Laterality: N/A;  . FLEXIBLE SIGMOIDOSCOPY N/A 12/23/2018   Procedure: FLEXIBLE SIGMOIDOSCOPY;  Surgeon: Lemar Lofty., MD;  Location: [facility] ENDOSCOPY;  Service: Gastroenterology;  Laterality: N/A;  . ICD IMPLANT N/A 09/03/2018   Procedure: ICD IMPLANT;  Surgeon: Regan Lemming, MD;  Location: [facility] INVASIVE CV LAB;  Service: Cardiovascular;  Laterality: N/A;  . ILEOSTOMY N/A 12/28/2018   Procedure: ILEOSTOMY;  Surgeon: Emelia Loron, MD;  Location: [facility] OR;  Service: General;  Laterality: N/A;  . IR RADIOLOGIST EVAL & MGMT  02/08/2019  . JOINT REPLACEMENT     right knee  . LEFT AND RIGHT HEART CATHETERIZATION WITH CORONARY ANGIOGRAM N/A 12/22/2011   Procedure: LEFT AND RIGHT HEART CATHETERIZATION WITH CORONARY ANGIOGRAM;  Surgeon: Dolores Patty, MD;  Location: [facility] CATH LAB;  Service: Cardiovascular;  Laterality: N/A;  . PATELLAR TENDON REPAIR     Left  . RIGHT HEART CATH N/A 10/20/2016   Procedure: RIGHT HEART CATH;  Surgeon: Dolores Patty, MD;  Location: [facility] INVASIVE CV LAB;  Service:  Cardiovascular;  Laterality: N/A;  . TEE WITHOUT CARDIOVERSION N/A 03/20/2017   Procedure: TRANSESOPHAGEAL ECHOCARDIOGRAM (TEE);  Surgeon: Dolores Patty, MD;  Location: [facility] ENDOSCOPY;  Service: Cardiovascular;  Laterality: N/A;    MEDICATIONS: . [START ON 09/05/2019] ceFAZolin (ANCEF) 3 g in dextrose 5 % 50 mL IVPB   . amiodarone (PACERONE) 200 MG tablet  . carvedilol (COREG) 3.125 MG tablet  . Cholecalciferol (VITAMIN D3 PO)  . coconut oil OIL  . digoxin (LANOXIN) 0.125 MG tablet  . potassium chloride SA (KLOR-CON) 20 MEQ tablet  . sacubitril-valsartan (ENTRESTO) 24-26 MG  . spironolactone (ALDACTONE) 25 MG tablet  . torsemide (DEMADEX) 20 MG tablet  . acetaminophen (TYLENOL) 500 MG tablet  . apixaban (ELIQUIS) 5 MG TABS tablet  . HYDROcodone-acetaminophen (NORCO) 5-325 MG tablet    Shonna Chock, PA-C Surgical Short Stay/Anesthesiology [facility] Phone [phone] [facility] Phone [phone] 09/02/2019 10:38 AM

## 2019-09-02 NOTE — Progress Notes (Signed)
Surgical Clearance has been received from Dr. Susann Givens, PCP pending cardiology evaluation (Dr. William Dalton with  for patient's upcoming surgery STSG to abdomen with Dr. Arita Miss  . Medications to hold prior to surgery Eliquis (Per pharmacy) (Stop taking: 1 day prior to surgery)

## 2019-09-03 NOTE — Telephone Encounter (Signed)
   Primary Cardiologist: Arvilla Meres, MD  Chart reviewed as part of pre-operative protocol coverage. Patient was contacted 09/03/2019 in reference to pre-operative risk assessment for pending surgery as outlined below.  Roberto [name] was last seen on 03/31/2019 by Dr. Gala Romney.  Since that day, Roberto [name] has done well without chest pain or significant shortness of breath.  Therefore, based on ACC/AHA guidelines, the patient would be at acceptable risk for the planned procedure without further cardiovascular testing.   I will route this recommendation to the requesting party via Epic fax function and remove from pre-op pool. Please call with questions.  As recommended by our clinical pharmacist, he will need to hold Eliquis for 1 day prior to the procedure and restart as soon as possible after the procedure at the discretion of the surgeon.  [place], [place] 09/03/2019, 3:30 PM

## 2019-09-05 ENCOUNTER — Encounter (HOSPITAL_COMMUNITY): Payer: Self-pay | Admitting: Plastic Surgery

## 2019-09-05 ENCOUNTER — Encounter (HOSPITAL_COMMUNITY)
Admission: RE | Disposition: A | Discharge status: Home / Self Care | Payer: Self-pay | Source: Home / Self Care | Attending: Plastic Surgery

## 2019-09-05 ENCOUNTER — Ambulatory Visit (HOSPITAL_COMMUNITY)
Admission: RE | Admit: 2019-09-05 | Discharge: 2019-09-05 | Disposition: A | Discharge status: Home / Self Care | Payer: BC Managed Care – PPO | Attending: Plastic Surgery | Admitting: Plastic Surgery

## 2019-09-05 ENCOUNTER — Other Ambulatory Visit: Payer: Self-pay

## 2019-09-05 ENCOUNTER — Encounter: Payer: Self-pay | Admitting: Plastic Surgery

## 2019-09-05 ENCOUNTER — Ambulatory Visit (HOSPITAL_COMMUNITY): Payer: BC Managed Care – PPO | Admitting: Vascular Surgery

## 2019-09-05 DIAGNOSIS — Z95810 Presence of automatic (implantable) cardiac defibrillator: Secondary | ICD-10-CM | POA: Insufficient documentation

## 2019-09-05 DIAGNOSIS — Z6841 Body Mass Index (BMI) 40.0 and over, adult: Secondary | ICD-10-CM | POA: Insufficient documentation

## 2019-09-05 DIAGNOSIS — G4730 Sleep apnea, unspecified: Secondary | ICD-10-CM | POA: Diagnosis not present

## 2019-09-05 DIAGNOSIS — Z91013 Allergy to seafood: Secondary | ICD-10-CM | POA: Insufficient documentation

## 2019-09-05 DIAGNOSIS — Z7901 Long term (current) use of anticoagulants: Secondary | ICD-10-CM | POA: Diagnosis not present

## 2019-09-05 DIAGNOSIS — I480 Paroxysmal atrial fibrillation: Secondary | ICD-10-CM | POA: Insufficient documentation

## 2019-09-05 DIAGNOSIS — T8130XD Disruption of wound, unspecified, subsequent encounter: Secondary | ICD-10-CM

## 2019-09-05 DIAGNOSIS — I2510 Atherosclerotic heart disease of native coronary artery without angina pectoris: Secondary | ICD-10-CM | POA: Insufficient documentation

## 2019-09-05 DIAGNOSIS — Z933 Colostomy status: Secondary | ICD-10-CM | POA: Insufficient documentation

## 2019-09-05 DIAGNOSIS — Z86711 Personal history of pulmonary embolism: Secondary | ICD-10-CM | POA: Diagnosis not present

## 2019-09-05 DIAGNOSIS — Z86718 Personal history of other venous thrombosis and embolism: Secondary | ICD-10-CM | POA: Insufficient documentation

## 2019-09-05 DIAGNOSIS — Y832 Surgical operation with anastomosis, bypass or graft as the cause of abnormal reaction of the patient, or of later complication, without mention of misadventure at the time of the procedure: Secondary | ICD-10-CM | POA: Diagnosis not present

## 2019-09-05 DIAGNOSIS — M1990 Unspecified osteoarthritis, unspecified site: Secondary | ICD-10-CM | POA: Diagnosis not present

## 2019-09-05 DIAGNOSIS — Z79899 Other long term (current) drug therapy: Secondary | ICD-10-CM | POA: Diagnosis not present

## 2019-09-05 DIAGNOSIS — T8131XA Disruption of external operation (surgical) wound, not elsewhere classified, initial encounter: Secondary | ICD-10-CM | POA: Diagnosis not present

## 2019-09-05 DIAGNOSIS — Z9049 Acquired absence of other specified parts of digestive tract: Secondary | ICD-10-CM | POA: Diagnosis not present

## 2019-09-05 DIAGNOSIS — I5042 Chronic combined systolic (congestive) and diastolic (congestive) heart failure: Secondary | ICD-10-CM | POA: Diagnosis not present

## 2019-09-05 HISTORY — DX: Body Mass Index (BMI) 40.0 and over, adult: Z684

## 2019-09-05 HISTORY — PX: SKIN SPLIT GRAFT: SHX444

## 2019-09-05 HISTORY — DX: Atherosclerotic heart disease of native coronary artery without angina pectoris: I25.10

## 2019-09-05 HISTORY — DX: Presence of spectacles and contact lenses: Z97.3

## 2019-09-05 HISTORY — DX: Pneumonia, unspecified organism: J18.9

## 2019-09-05 HISTORY — DX: Morbid (severe) obesity due to excess calories: E66.01

## 2019-09-05 HISTORY — PX: APPLICATION OF WOUND VAC: SHX5189

## 2019-09-05 HISTORY — DX: Unspecified open wound of abdominal wall, unspecified quadrant without penetration into peritoneal cavity, initial encounter: S31.109A

## 2019-09-05 HISTORY — DX: Inflammatory liver disease, unspecified: K75.9

## 2019-09-05 LAB — BASIC METABOLIC PANEL
Anion gap: 12 (ref 5–15)
BUN: 16 mg/dL (ref 6–20)
CO2: 23 mmol/L (ref 22–32)
Calcium: 8.5 mg/dL — ABNORMAL LOW (ref 8.9–10.3)
Chloride: 107 mmol/L (ref 98–111)
Creatinine, Ser: 1.45 mg/dL — ABNORMAL HIGH (ref 0.61–1.24)
GFR calc Af Amer: >60 mL/min (ref >60)
GFR calc non Af Amer: 54 mL/min — ABNORMAL LOW (ref >60)
Glucose, Bld: 129 mg/dL — ABNORMAL HIGH (ref 70–99)
Potassium: 3.5 mmol/L (ref 3.5–5.1)
Sodium: 142 mmol/L (ref 135–145)

## 2019-09-05 LAB — CBC
HCT: 47.3 % (ref 39.0–52.0)
Hemoglobin: 14.7 g/dL (ref 13.0–17.0)
MCH: 28.5 pg (ref 26.0–34.0)
MCHC: 31.1 g/dL (ref 30.0–36.0)
MCV: 91.8 fL (ref 80.0–100.0)
Platelets: 166 10*3/uL (ref 150–400)
RBC: 5.15 MIL/uL (ref 4.22–5.81)
RDW: 14.8 % (ref 11.5–15.5)
WBC: 9.2 10*3/uL (ref 4.0–10.5)
nRBC: 0 % (ref 0.0–0.2)

## 2019-09-05 SURGERY — APPLICATION, GRAFT, SKIN, SPLIT-THICKNESS
Anesthesia: General | Site: Abdomen

## 2019-09-05 MED ORDER — LACTATED RINGERS IV SOLN: INTRAVENOUS | Status: DC

## 2019-09-05 MED ORDER — ONDANSETRON HCL 4 MG/2ML IJ SOLN
INTRAMUSCULAR | Status: DC | PRN
  Administered 2019-09-05: 4 mg via INTRAVENOUS

## 2019-09-05 MED ORDER — FENTANYL CITRATE (PF) 250 MCG/5ML IJ SOLN
INTRAMUSCULAR | Status: DC | PRN
Start: 2019-09-05 — End: 2019-09-05
  Administered 2019-09-05: 100 ug via INTRAVENOUS

## 2019-09-05 MED ORDER — ETOMIDATE 2 MG/ML IV SOLN
INTRAVENOUS | Status: DC | PRN
  Administered 2019-09-05: 10 mg via INTRAVENOUS
  Administered 2019-09-05: 4 mg via INTRAVENOUS

## 2019-09-05 MED ORDER — PROMETHAZINE HCL 25 MG/ML IJ SOLN: 6.2500 mg | INTRAMUSCULAR | Status: DC | PRN

## 2019-09-05 MED ORDER — ORAL CARE MOUTH RINSE: 15.0000 mL | Freq: Once | OROMUCOSAL | Status: AC

## 2019-09-05 MED ORDER — LIDOCAINE 2% (20 MG/ML) 5 ML SYRINGE
INTRAMUSCULAR | Status: DC | PRN
  Administered 2019-09-05: 100 mg via INTRAVENOUS

## 2019-09-05 MED ORDER — SUGAMMADEX SODIUM 200 MG/2ML IV SOLN
INTRAVENOUS | Status: DC | PRN
  Administered 2019-09-05: 200 mg via INTRAVENOUS

## 2019-09-05 MED ORDER — PROPOFOL 10 MG/ML IV BOLUS
INTRAVENOUS | Status: AC
  Filled 2019-09-05: qty 20

## 2019-09-05 MED ORDER — SODIUM CHLORIDE 0.9 % IV SOLN
INTRAVENOUS | Status: AC
  Filled 2019-09-05: qty 500000

## 2019-09-05 MED ORDER — MIDAZOLAM HCL 2 MG/2ML IJ SOLN
INTRAMUSCULAR | Status: AC
  Filled 2019-09-05: qty 2

## 2019-09-05 MED ORDER — ROCURONIUM BROMIDE 10 MG/ML (PF) SYRINGE
PREFILLED_SYRINGE | INTRAVENOUS | Status: DC | PRN
  Administered 2019-09-05: 20 mg via INTRAVENOUS
  Administered 2019-09-05: 50 mg via INTRAVENOUS

## 2019-09-05 MED ORDER — PROPOFOL 10 MG/ML IV BOLUS
INTRAVENOUS | Status: DC | PRN
  Administered 2019-09-05: 50 mg via INTRAVENOUS
  Administered 2019-09-05: 20 mg via INTRAVENOUS

## 2019-09-05 MED ORDER — PHENYLEPHRINE 40 MCG/ML (10ML) SYRINGE FOR IV PUSH (FOR BLOOD PRESSURE SUPPORT)
PREFILLED_SYRINGE | INTRAVENOUS | Status: AC
  Filled 2019-09-05: qty 10

## 2019-09-05 MED ORDER — MINERAL OIL LIGHT 100 % EX OIL
TOPICAL_OIL | CUTANEOUS | Status: DC | PRN
  Administered 2019-09-05: 1 via TOPICAL

## 2019-09-05 MED ORDER — FENTANYL CITRATE (PF) 250 MCG/5ML IJ SOLN
INTRAMUSCULAR | Status: AC
  Filled 2019-09-05: qty 5

## 2019-09-05 MED ORDER — OXYCODONE HCL 5 MG/5ML PO SOLN: 5.0000 mg | Freq: Once | ORAL | Status: DC | PRN

## 2019-09-05 MED ORDER — ETOMIDATE 2 MG/ML IV SOLN
INTRAVENOUS | Status: AC
  Filled 2019-09-05: qty 10

## 2019-09-05 MED ORDER — SODIUM CHLORIDE 0.9 % IR SOLN
Status: DC | PRN
  Administered 2019-09-05: 1000 mL

## 2019-09-05 MED ORDER — ACETAMINOPHEN 500 MG PO TABS: 1000.0000 mg | ORAL_TABLET | Freq: Once | ORAL | Status: AC

## 2019-09-05 MED ORDER — CHLORHEXIDINE GLUCONATE 0.12 % MT SOLN
OROMUCOSAL | Status: AC
  Administered 2019-09-05: 15 mL via OROMUCOSAL
  Filled 2019-09-05: qty 15

## 2019-09-05 MED ORDER — SUCCINYLCHOLINE CHLORIDE 200 MG/10ML IV SOSY
PREFILLED_SYRINGE | INTRAVENOUS | Status: DC | PRN
  Administered 2019-09-05: 200 mg via INTRAVENOUS

## 2019-09-05 MED ORDER — PHENYLEPHRINE 40 MCG/ML (10ML) SYRINGE FOR IV PUSH (FOR BLOOD PRESSURE SUPPORT)
PREFILLED_SYRINGE | INTRAVENOUS | Status: DC | PRN
  Administered 2019-09-05 (×2): 80 ug via INTRAVENOUS
  Administered 2019-09-05: 120 ug via INTRAVENOUS

## 2019-09-05 MED ORDER — PHENYLEPHRINE HCL-NACL 10-0.9 MG/250ML-% IV SOLN
INTRAVENOUS | Status: DC | PRN
  Administered 2019-09-05: 50 ug/min via INTRAVENOUS

## 2019-09-05 MED ORDER — BUPIVACAINE HCL (PF) 0.25 % IJ SOLN
INTRAMUSCULAR | Status: AC
  Filled 2019-09-05: qty 30

## 2019-09-05 MED ORDER — EPINEPHRINE PF 1 MG/ML IJ SOLN
INTRAMUSCULAR | Status: DC | PRN
  Administered 2019-09-05: 1 mg

## 2019-09-05 MED ORDER — ACETAMINOPHEN 500 MG PO TABS
ORAL_TABLET | ORAL | Status: AC
  Administered 2019-09-05: 1000 mg via ORAL
  Filled 2019-09-05: qty 2

## 2019-09-05 MED ORDER — DEXAMETHASONE SODIUM PHOSPHATE 10 MG/ML IJ SOLN
INTRAMUSCULAR | Status: AC
  Filled 2019-09-05: qty 1

## 2019-09-05 MED ORDER — EPINEPHRINE PF 1 MG/ML IJ SOLN
INTRAMUSCULAR | Status: AC
  Filled 2019-09-05: qty 1

## 2019-09-05 MED ORDER — FENTANYL CITRATE (PF) 100 MCG/2ML IJ SOLN: 25.0000 ug | INTRAMUSCULAR | Status: DC | PRN

## 2019-09-05 MED ORDER — ONDANSETRON HCL 4 MG/2ML IJ SOLN
INTRAMUSCULAR | Status: AC
  Filled 2019-09-05: qty 2

## 2019-09-05 MED ORDER — DEXAMETHASONE SODIUM PHOSPHATE 10 MG/ML IJ SOLN
INTRAMUSCULAR | Status: DC | PRN
  Administered 2019-09-05: 5 mg via INTRAVENOUS

## 2019-09-05 MED ORDER — MIDAZOLAM HCL 2 MG/2ML IJ SOLN
INTRAMUSCULAR | Status: DC | PRN
  Administered 2019-09-05: 2 mg via INTRAVENOUS

## 2019-09-05 MED ORDER — BUPIVACAINE HCL 0.25 % IJ SOLN
INTRAMUSCULAR | Status: DC | PRN
  Administered 2019-09-05: 30 mL

## 2019-09-05 MED ORDER — CHLORHEXIDINE GLUCONATE 0.12 % MT SOLN: 15.0000 mL | Freq: Once | OROMUCOSAL | Status: AC

## 2019-09-05 MED ORDER — LIDOCAINE 2% (20 MG/ML) 5 ML SYRINGE
INTRAMUSCULAR | Status: AC
  Filled 2019-09-05: qty 5

## 2019-09-05 MED ORDER — SUCCINYLCHOLINE CHLORIDE 200 MG/10ML IV SOSY
PREFILLED_SYRINGE | INTRAVENOUS | Status: AC
  Filled 2019-09-05: qty 10

## 2019-09-05 MED ORDER — OXYCODONE HCL 5 MG PO TABS: 5.0000 mg | ORAL_TABLET | Freq: Once | ORAL | Status: DC | PRN

## 2019-09-05 MED ORDER — SODIUM CHLORIDE 0.9 % IV SOLN
INTRAVENOUS | Status: DC | PRN
  Administered 2019-09-05: 350 mL

## 2019-09-05 SURGICAL SUPPLY — 39 items
BAG DECANTER FOR FLEXI CONT (MISCELLANEOUS) ×6 IMPLANT
BLADE DERMATOME SS (BLADE) ×3 IMPLANT
BRUSH SCRUB EZ PLAIN DRY (MISCELLANEOUS) ×6 IMPLANT
CANISTER SUCT 3000ML PPV (MISCELLANEOUS) IMPLANT
COVER SURGICAL LIGHT HANDLE (MISCELLANEOUS) ×3 IMPLANT
DERMACARRIERS GRAFT 1 TO 1.5 (DISPOSABLE) ×3
DRAPE EXTREMITY T 121X128X90 (DISPOSABLE) IMPLANT
DRAPE HALF SHEET 40X57 (DRAPES) ×3 IMPLANT
DRAPE INCISE IOBAN 66X45 STRL (DRAPES) ×5 IMPLANT
DRAPE LAPAROTOMY 100X72X124 (DRAPES) ×2 IMPLANT
DRAPE ORTHO SPLIT 77X108 STRL (DRAPES) ×6
DRAPE SURG ORHT 6 SPLT 77X108 (DRAPES) ×2 IMPLANT
DRSG CALCIUM ALGINATE 4X4 (GAUZE/BANDAGES/DRESSINGS) ×2 IMPLANT
DRSG MEPITEL 4X7.2 (GAUZE/BANDAGES/DRESSINGS) ×2 IMPLANT
DRSG PAD ABDOMINAL 8X10 ST (GAUZE/BANDAGES/DRESSINGS) ×4 IMPLANT
ELECT REM PT RETURN 9FT ADLT (ELECTROSURGICAL) ×3
ELECTRODE REM PT RTRN 9FT ADLT (ELECTROSURGICAL) IMPLANT
FILTER STRAW FLUID ASPIR (MISCELLANEOUS) ×3 IMPLANT
GAUZE SPONGE 4X4 12PLY STRL (GAUZE/BANDAGES/DRESSINGS) ×4 IMPLANT
GLOVE BIOGEL M STRL SZ7.5 (GLOVE) ×6 IMPLANT
GLOVE INDICATOR 8.0 STRL GRN (GLOVE) ×8 IMPLANT
GOWN STRL REUS W/ TWL LRG LVL3 (GOWN DISPOSABLE) ×2 IMPLANT
GOWN STRL REUS W/TWL LRG LVL3 (GOWN DISPOSABLE) ×12
GRAFT DERMACARRIERS 1 TO 1.5 (DISPOSABLE) ×1 IMPLANT
IV NS 1000ML (IV SOLUTION) ×3
IV NS 1000ML BAXH (IV SOLUTION) ×1 IMPLANT
KIT BASIN OR (CUSTOM PROCEDURE TRAY) ×3 IMPLANT
KIT TURNOVER KIT B (KITS) ×3 IMPLANT
NDL SPNL 18GX3.5 QUINCKE PK (NEEDLE) ×1 IMPLANT
NEEDLE SPNL 18GX3.5 QUINCKE PK (NEEDLE) ×3 IMPLANT
PACK GENERAL/GYN (CUSTOM PROCEDURE TRAY) ×3 IMPLANT
PAD ARMBOARD 7.5X6 YLW CONV (MISCELLANEOUS) ×6 IMPLANT
STAPLER VISISTAT 35W (STAPLE) IMPLANT
SYR 50ML LL SCALE MARK (SYRINGE) ×6 IMPLANT
SYR CONTROL 10ML LL (SYRINGE) ×3 IMPLANT
TAPE CLOTH SURG 6X10 WHT LF (GAUZE/BANDAGES/DRESSINGS) ×4 IMPLANT
TOWEL GREEN STERILE (TOWEL DISPOSABLE) ×3 IMPLANT
TOWEL GREEN STERILE FF (TOWEL DISPOSABLE) ×3 IMPLANT
UNDERPAD 30X36 HEAVY ABSORB (UNDERPADS AND DIAPERS) ×3 IMPLANT

## 2019-09-05 NOTE — Discharge Instructions (Addendum)
Activity As tolerated:  NO showers NO driving No heavy activities  Diet: Regular  RESTART ELIQUIS TOMORROW 09/06/2019.  Wound Care: Keep dressing clean & dry. Keep wound vac on 24/7. Do not remove for 1 week (We will remove in the office).  Special Instructions: Call Doctor if any unusual problems occur such as pain, excessive Bleeding, unrelieved Nausea/vomiting, Fever &/or chills   Follow-up appointment: Scheduled for next week.

## 2019-09-05 NOTE — Anesthesia Procedure Notes (Signed)
Procedure Name: Intubation Date/Time: 09/05/2019 10:36 AM Performed by: Shary Decamp, CRNA Pre-anesthesia Checklist: Patient identified, Emergency Drugs available, Suction available and Patient being monitored Patient Re-evaluated:Patient Re-evaluated prior to induction Oxygen Delivery Method: Circle system utilized Preoxygenation: Pre-oxygenation with 100% oxygen Induction Type: IV induction Ventilation: Mask ventilation without difficulty Laryngoscope Size: Miller and 2 Grade View: Grade I Tube type: Oral Tube size: 7.5 mm Number of attempts: 1 Airway Equipment and Method: Stylet and Oral airway Placement Confirmation: ETT inserted through vocal cords under direct vision,  positive ETCO2 and breath sounds checked- equal and bilateral Secured at: 22 cm Tube secured with: Tape Dental Injury: Teeth and Oropharynx as per pre-operative assessment

## 2019-09-05 NOTE — Interval H&P Note (Signed)
Patient seen and examined. Ready for surgery today. Rest of [place] discuss.

## 2019-09-05 NOTE — Anesthesia Postprocedure Evaluation (Signed)
Anesthesia Post Note  Patient: Roberto [name]  Procedure(s) Performed: SKIN GRAFT SPLIT THICKNESS (N/A Abdomen) APPLICATION OF WOUND VAC (N/A Abdomen)     Patient location during evaluation: PACU Anesthesia Type: General Level of consciousness: awake and alert and oriented Pain management: pain level controlled Vital Signs Assessment: post-procedure vital signs reviewed and stable Respiratory status: spontaneous breathing, nonlabored ventilation and respiratory function stable Cardiovascular status: blood pressure returned to baseline Postop Assessment: no apparent nausea or vomiting Anesthetic complications: no   No complications documented.  Last Vitals:  Vitals:   09/05/19 1236 09/05/19 1251  BP: (!) 92/57 92/62  Pulse: 69 68  Resp: 13 14  Temp:    SpO2: 94% 92%    Last Pain:  Vitals:   09/05/19 1251  TempSrc:   PainSc: 0-No pain                 Kaylyn Layer

## 2019-09-05 NOTE — Op Note (Signed)
Operative Note   DATE OF OPERATION: 09/05/2019  SURGICAL DEPARTMENT: Plastic Surgery  PREOPERATIVE DIAGNOSES: Chronic abdominal wound  POSTOPERATIVE DIAGNOSES:  same  PROCEDURE: 1.  Surgical preparation for skin graft chronic abdominal wound totaling 5 x 4 cm 2.  Split-thickness skin graft to open abdominal wound totaling 5 x 4 cm  SURGEON: Ancil Linsey, MD  ASSISTANT: Zadie Cleverly, PA The advanced practice practitioner (APP) assisted throughout the case.  The APP was essential in retraction and counter traction when needed to make the case progress smoothly.  This retraction and assistance made it possible to see the tissue plans for the procedure.  The assistance was needed for blood control, tissue re-approximation and assisted with closure of the incision site.  ANESTHESIA:  General.   COMPLICATIONS: None.   INDICATIONS FOR PROCEDURE:  The patient, Roberto [name] is a 54 y.o. male born on 1965-11-03, is here for treatment of chronic abdominal wound after wound dehiscence after diverting colostomy operation. MRN: [id number]  CONSENT:  Informed consent was obtained directly from the patient. Risks, benefits and alternatives were fully discussed. Specific risks including but not limited to bleeding, infection, hematoma, seroma, scarring, pain, contracture, asymmetry, wound healing problems, and need for further surgery were all discussed. The patient did have an ample opportunity to have questions answered to satisfaction.   DESCRIPTION OF PROCEDURE:  The patient was taken to the operating room. SCDs were placed and Ancef antibiotics were given.  General anesthesia was administered.  [name] operative site was prepped and draped in a sterile fashion. A time out was performed and all information was confirmed to be correct.  Started by measuring up the wound.  This was found to be 5 x 4 cm.  Surgical preparation was then performed with the bevel of a 10 blade to remove any  colonized granulation tissue.  This was done cautiously.  The wound was then irrigated.  I then turned my attention to the left thigh.  Tumescent solution was infiltrated.  A dermatome was used to harvest a split-thickness skin graft totaling 14 thousandths of an inch thickness.  It was then meshed 1.5-1 and inset with skin staples.  The donor site was dressed with calcium alginate and Ioban.  The skin graft was covered with Mepitel and black sponge followed by wound VAC.  Good seal was able to be obtained.  The patient tolerated the procedure well.  There were no complications. The patient was allowed to wake from anesthesia, extubated and taken to the recovery room in satisfactory condition.

## 2019-09-05 NOTE — Brief Op Note (Signed)
09/05/2019  11:16 AM  PATIENT:  Roberto [name]  54 y.o. male  PRE-OPERATIVE DIAGNOSIS:  Abdominal wound dehiscence  POST-OPERATIVE DIAGNOSIS:  * No post-op diagnosis entered *  PROCEDURE:  Procedure(s) with comments: SKIN GRAFT SPLIT THICKNESS (N/A) - 1 hour total, please APPLICATION OF WOUND VAC (N/A)  SURGEON:  Surgeon(s) and Role:    * Jezabella Schriever, Wendy Poet, MD - Primary  PHYSICIAN ASSISTANT: Materials engineer, PA  ASSISTANTS: none   ANESTHESIA:   general  EBL:  10   BLOOD ADMINISTERED:none  DRAINS: none   LOCAL MEDICATIONS USED:  MARCAINE     SPECIMEN:  No Specimen  DISPOSITION OF SPECIMEN:  PATHOLOGY  COUNTS:  YES  TOURNIQUET:  * No tourniquets in log *  DICTATION: .Dragon Dictation  PLAN OF CARE: Discharge to home after PACU  PATIENT DISPOSITION:  PACU - hemodynamically stable.   Delay start of Pharmacological VTE agent (>24hrs) due to surgical blood loss or risk of bleeding: not applicable

## 2019-09-05 NOTE — Transfer of Care (Signed)
Immediate Anesthesia Transfer of Care Note  Patient: Roberto [name]  Procedure(s) Performed: SKIN GRAFT SPLIT THICKNESS (N/A Abdomen) APPLICATION OF WOUND VAC (N/A )  Patient Location: PACU  Anesthesia Type:General  Level of Consciousness: drowsy and patient cooperative  Airway & Oxygen Therapy: Patient Spontanous Breathing and Patient connected to face mask oxygen  Post-op Assessment: Report given to RN and Post -op Vital signs reviewed and stable  Post vital signs: Reviewed and stable  Last Vitals:  Vitals Value Taken Time  BP 103/84 09/05/19 1136  Temp 36.8 C 09/05/19 1136  Pulse 68 09/05/19 1139  Resp 24 09/05/19 1139  SpO2 93 % 09/05/19 1139  Vitals shown include unvalidated device data.  Last Pain:  Vitals:   09/05/19 1136  TempSrc:   PainSc: Asleep         Complications: No complications documented.

## 2019-09-06 ENCOUNTER — Telehealth: Payer: Self-pay | Admitting: Plastic Surgery

## 2019-09-06 ENCOUNTER — Encounter (HOSPITAL_COMMUNITY): Payer: Self-pay | Admitting: Plastic Surgery

## 2019-09-06 NOTE — Telephone Encounter (Signed)
Patient called to ask if he can use the leftover collagen (from the surgery on his stomach) on his thigh when he changes the bandage. Please call to advise.

## 2019-09-06 NOTE — Telephone Encounter (Signed)
I spoke with patient, advised him to restart his eliquis today. Advised him to not remove any of the dressings until we see him at his 1 week follow up.

## 2019-09-12 ENCOUNTER — Telehealth (HOSPITAL_COMMUNITY): Payer: Self-pay

## 2019-09-12 NOTE — Telephone Encounter (Signed)
Surgical clearance requested from [facility] for Split-thicknedd skin graft to abdomen with wound VAC application. Asking about holding eliquis  Per DB in terms of Eliquis: "hold 48hrs prior. Restart ASAP"   Faxed to: [phone] on 09/12/19

## 2019-09-12 NOTE — Progress Notes (Signed)
Advanced Heart Failure Clinic Note   Patient ID: Roberto [name], male   DOB: [birth year], 54 y.o.   MRN: [id number] PCP: Dr Jacqulyn Bath Cardiologist: Dr [name] EP: Dr Johney Frame AHF: Dr. Gala Romney   HPI: Roberto [name] is a 54 y.o. 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   Admitted to [facility] 12/16/11 for recurrent HF. Diuresed 20 pounds with IV lasix. See RHC/LHC results   Admitted 10/14/16-10/21/16 with AF and tachy induced cardiomyopathy. EF dropped from 40%-> 15%. Underwent DCCV on 10/17/16 with successful conversion to NSR. Started on amiodarone. Diuresed 12 pounds with IV lasix. Discharge weight was 279 pounds.  S/p AFL ablation 11/17/2016  Admitted 3/8 - 03/26/17. Pt underwent outpatient DCCV 03/20/17, then came back that evening with worsening SOB and CP. Diuresed with IV lasix. Also treated for cellulitis of his RLE, and gout. Discharge weight 280 lbs.  Echo 7/20: EF 25-30%   Admitted 12/19/18- 01/24/19 for acute diverticulits. Failed IV abx. Underwent R colectomy with ileostomy.  In OR patient became hypotensive, required pressors (Phenylephrine and Levo).Course c/b perforated R colon with right lateral peritoneal fluid collection s/p right lateral abdominal drain placement in IR 01/04/2019 by IR Dr. Fredia Sorrow. HF meds stopped during hospital but restarted.   Here for HF follow-up. Has been struggling with non-healing ab wound and recently underwent skin grafting on 09/05/19. Feels it is getting better. Says he is doing ok but gets SOB when he sleeps on his left side on some nights. Can get around ok without SOB. Taking torsemide 20 bid. No LE edema   Cardiac studies:  Echo 07/2017 EF appears slightly improved to 20-25%. Possible clot (vs artifact in IVC/RA junction) Personally reviewed  EKG 07/2017 Sinus  rhythm 63 with 1st degree AV block.    Echo 10/15/16 - LVEF 15% TEE 03/20/17 LVEF 15-20%, Severe reduced RV function  RHC 10/20/16  RA = 3 RV = 45/7 PA = 49/20 (31) PCW = 28 Fick cardiac output/index = 5.0/2.1 PVR = 0.5 WU Ao sat = 95% PA sat = 64%, 61%  12/17/11 ECHO 25-30%  05/20/2012 ECHO EF 40-45%  CARDIAC MRI - 12/18/11  1. Moderately dilated left ventricle with moderately decreased systolic function, EF 38%. Global hypokinesis.  2. Mildly dilated right ventricle with mild to moderately decreased systolic function.  3. No definite LV thrombus noted.  4. Patchy non-subendocardial delayed enhancement seen in the ventricular septum (see above for description). This is not suggestive of a coronary disease pattern. This could be suggestive of infiltrative disease versus prior myocarditis.  RHC/LHC 12/22/11  Cors: Normal  SH: Lives with his wife and 5 children. Works full time running a Day[employer]  Review of systems complete and found to be negative unless listed in HPI.    Past Medical History:  Diagnosis Date  . AICD (automatic cardioverter/defibrillator) present 08/2018  . Arthritis   . Chronic combined systolic and diastolic CHF (congestive heart failure) (HCC)   . Coronary artery disease   . Deep vein thrombosis (HCC)   . Diverticulitis   . Hepatitis   . Lupus anticoagulant positive   . Morbid obesity (HCC)   . Morbid obesity with BMI of 50.0-59.9, adult (HCC)   . Mural thrombus of heart    coumadin  . Nonischemic cardiomyopathy (HCC)    a)  12/17/11 echo: LVEF 25-30%, grade 3 diastolic dysfunction (c/w restriction), mod MR, mod LA/LV and mild RA dilatation; b) 12/18/11 cMRI: LVEF 38%, mod LV/mild RV dilatation, global HK, mild-mod RV sys dysfxn, no LV thrombus & patchy non-subendocardial delayed enhancement c/w infil dz or prior myocarditis; c. 07/2018 EF 25-30%.  . Paroxysmal SVT (supraventricular tachycardia) (HCC)   . Pneumonia   . Pulmonary embolism (HCC)    DVT and PE  after knee surgery in 2007  . Wears glasses   . Wound of abdomen     Current Outpatient Medications  Medication Sig Dispense Refill  . acetaminophen (TYLENOL) 500 MG tablet Take 1 tablet (500 mg total) by mouth every 6 (six) hours as needed. For use AFTER surgery 30 tablet 0  . amiodarone (PACERONE) 200 MG tablet Take 1 tablet (200 mg total) by mouth daily. 30 tablet 5  . apixaban (ELIQUIS) 5 MG TABS tablet Take 1 tablet (5 mg total) by mouth 2 (two) times daily. 60 tablet 5  . carvedilol (COREG) 3.125 MG tablet Take 1 tablet (3.125 mg total) by mouth 2 (two) times daily with a meal. 60 tablet 3  . Cholecalciferol (VITAMIN D3 PO) Take 450 mg by mouth in the morning and at bedtime.    . coconut oil OIL Apply 1 application topically daily as needed (dry skin).     [name] digoxin (LANOXIN) 0.125 MG tablet Take 0.125 mg by mouth daily.    . potassium chloride SA (KLOR-CON) 20 MEQ tablet Take 1 tablet (20 mEq total) by mouth daily. 30 tablet 5  . sacubitril-valsartan (ENTRESTO) 24-26 MG Take 1 tablet by mouth 2 (two) times daily. 60 tablet 5  . spironolactone (ALDACTONE) 25 MG tablet Take 12.5 mg by mouth daily.    [name] torsemide (DEMADEX) 20 MG tablet Take 1 tablet (20 mg total) by mouth 2 (two) times daily. Take an extra tablet as needed 75 tablet 5   No current facility-administered medications for this encounter.   PHYSICAL EXAM: Vitals:   09/13/19 0900  BP: 120/80  Pulse: (!) 54  SpO2: 96%  Weight: (!) 152 kg (335 lb 3.2 oz)   Wt Readings from Last 3 Encounters:  09/13/19 (!) 152 kg (335 lb 3.2 oz)  09/05/19 (!) 149.7 kg (330 lb)  09/01/19 (!) 152.4 kg (336 lb)    PHYSICAL EXAM: General:  Obese male. No resp difficulty HEENT: normal Neck: supple. no JVD. Carotids 2+ bilat; no bruits. No lymphadenopathy or thryomegaly appreciated. Cor: PMI nondisplaced. Regular rate & rhythm. No rubs, gallops or murmurs. Lungs: clear Abdomen: obese soft, nontender, nondistended. No hepatosplenomegaly.  No bruits or masses. Good bowel sounds. + wound vac site ok Extremities: no cyanosis, clubbing, rash, edema Neuro: alert & orientedx3, cranial nerves grossly intact. moves all 4 extremities w/o difficulty. Affect pleasant  ICD interrogation: No VT/AF. Volume drifting up but still below threshold. Personally reviewed   ASSESSMENT & PLAN:  1. Chronic Systolic Heart Failure: - Echo 10/15/16 - LVEF 15% - TEE 03/2017 LVEF 15-20% - Echo 07/2017 with LVEF 20-25%. - Echo 7/20 EF 25-30% - Stable NYHA Class II - Volume status slightly elevated  - Continue torsemide 20 bid (increase to 40/20 for 2 days + kdur for 60 daily) - Continue spiro 12.5 mg daily - Continue digoxin 0.125 mg daily. Check dig level  - Continue carvedilol 3.125 bid - Continue Entresto 24/26 bid - Would like to add Marcelline Deist soon once wounds heal  - Has ICD followed by Dr. Elberta Fortis  2. H/O Acute diverticulitis with colon resection and ileostomy - non-healing wound. 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 - Maintaining NSR. - Currently on amiodarone 200 mg daily and Eliquis 5 bid  4. Obesity: - Body mass index is 50.97 kg/m.  - continue attempts at weight loss  Arvilla Meres, MD  9:16 AM

## 2019-09-13 ENCOUNTER — Ambulatory Visit (HOSPITAL_COMMUNITY)
Admission: RE | Admit: 2019-09-13 | Discharge: 2019-09-13 | Disposition: A | Discharge status: Home / Self Care | Payer: BC Managed Care – PPO | Source: Ambulatory Visit | Attending: Internal Medicine | Admitting: Internal Medicine

## 2019-09-13 ENCOUNTER — Other Ambulatory Visit: Payer: Self-pay

## 2019-09-13 VITALS — BP 120/80 | HR 54 | Wt 335.2 lb

## 2019-09-13 DIAGNOSIS — I2510 Atherosclerotic heart disease of native coronary artery without angina pectoris: Secondary | ICD-10-CM | POA: Insufficient documentation

## 2019-09-13 DIAGNOSIS — I5022 Chronic systolic (congestive) heart failure: Secondary | ICD-10-CM | POA: Insufficient documentation

## 2019-09-13 DIAGNOSIS — Z86711 Personal history of pulmonary embolism: Secondary | ICD-10-CM | POA: Diagnosis not present

## 2019-09-13 DIAGNOSIS — Y838 Other surgical procedures as the cause of abnormal reaction of the patient, or of later complication, without mention of misadventure at the time of the procedure: Secondary | ICD-10-CM | POA: Insufficient documentation

## 2019-09-13 DIAGNOSIS — Z6841 Body Mass Index (BMI) 40.0 and over, adult: Secondary | ICD-10-CM | POA: Insufficient documentation

## 2019-09-13 DIAGNOSIS — T8130XD Disruption of wound, unspecified, subsequent encounter: Secondary | ICD-10-CM | POA: Insufficient documentation

## 2019-09-13 DIAGNOSIS — I480 Paroxysmal atrial fibrillation: Secondary | ICD-10-CM | POA: Diagnosis not present

## 2019-09-13 DIAGNOSIS — I440 Atrioventricular block, first degree: Secondary | ICD-10-CM | POA: Diagnosis not present

## 2019-09-13 DIAGNOSIS — Z86718 Personal history of other venous thrombosis and embolism: Secondary | ICD-10-CM | POA: Diagnosis not present

## 2019-09-13 DIAGNOSIS — Z79899 Other long term (current) drug therapy: Secondary | ICD-10-CM | POA: Insufficient documentation

## 2019-09-13 DIAGNOSIS — Z95810 Presence of automatic (implantable) cardiac defibrillator: Secondary | ICD-10-CM | POA: Diagnosis not present

## 2019-09-13 DIAGNOSIS — Z9049 Acquired absence of other specified parts of digestive tract: Secondary | ICD-10-CM | POA: Insufficient documentation

## 2019-09-13 DIAGNOSIS — I4819 Other persistent atrial fibrillation: Secondary | ICD-10-CM | POA: Diagnosis not present

## 2019-09-13 DIAGNOSIS — Z7901 Long term (current) use of anticoagulants: Secondary | ICD-10-CM | POA: Insufficient documentation

## 2019-09-13 DIAGNOSIS — I429 Cardiomyopathy, unspecified: Secondary | ICD-10-CM | POA: Insufficient documentation

## 2019-09-13 DIAGNOSIS — Z932 Ileostomy status: Secondary | ICD-10-CM | POA: Diagnosis not present

## 2019-09-13 LAB — CBC
HCT: 50.2 % (ref 39.0–52.0)
Hemoglobin: 15.5 g/dL (ref 13.0–17.0)
MCH: 28.6 pg (ref 26.0–34.0)
MCHC: 30.9 g/dL (ref 30.0–36.0)
MCV: 92.6 fL (ref 80.0–100.0)
Platelets: 173 10*3/uL (ref 150–400)
RBC: 5.42 MIL/uL (ref 4.22–5.81)
RDW: 14.5 % (ref 11.5–15.5)
WBC: 10.4 10*3/uL (ref 4.0–10.5)
nRBC: 0 % (ref 0.0–0.2)

## 2019-09-13 LAB — BASIC METABOLIC PANEL
Anion gap: 12 (ref 5–15)
BUN: 10 mg/dL (ref 6–20)
CO2: 24 mmol/L (ref 22–32)
Calcium: 8.5 mg/dL — ABNORMAL LOW (ref 8.9–10.3)
Chloride: 100 mmol/L (ref 98–111)
Creatinine, Ser: 1.39 mg/dL — ABNORMAL HIGH (ref 0.61–1.24)
GFR calc Af Amer: >60 mL/min (ref >60)
GFR calc non Af Amer: 57 mL/min — ABNORMAL LOW (ref >60)
Glucose, Bld: 135 mg/dL — ABNORMAL HIGH (ref 70–99)
Potassium: 4 mmol/L (ref 3.5–5.1)
Sodium: 136 mmol/L (ref 135–145)

## 2019-09-13 LAB — DIGOXIN LEVEL: Digoxin Level: <0.2 ng/mL — ABNORMAL LOW (ref 0.8–2.0)

## 2019-09-13 LAB — BRAIN NATRIURETIC PEPTIDE: B Natriuretic Peptide: 348.4 pg/mL — ABNORMAL HIGH (ref 0.0–100.0)

## 2019-09-13 MED ORDER — DIGOXIN 125 MCG PO TABS
0.1250 mg | ORAL_TABLET | Freq: Every day | ORAL | 3 refills | Status: DC
Start: 2019-09-13 — End: 2019-10-19

## 2019-09-13 NOTE — Patient Instructions (Addendum)
FOR 2 DAYS:    INCREASE Torsemide 40mg  (2 tablets) in the morning and 20mg  (1 tablets) in the evening    INCREASE Potassium 60 mg (3 tablets) daily  AFTER 2 DAYS THEN RESUME NORMAL DOSAGES  Labs done today, your results will be available in MyChart, we will contact you for abnormal readings.  I sent a refill of Digoxin has been sent to your pharmacy   Please follow up with our office in 3 months for your appointment and echocardiogram  Your physician has requested that you have an echocardiogram. Echocardiography is a painless test that uses sound waves to create images of your heart. It provides your doctor with information about the size and shape of your heart and how well your heart's chambers and valves are working. This procedure takes approximately one hour. There are no restrictions for this procedure.   If you have any questions or concerns before your next appointment please send a message through [facility] or call our office at [phone].    TO LEAVE A MESSAGE FOR THE NURSE SELECT OPTION 2, PLEASE LEAVE A MESSAGE INCLUDING: . YOUR NAME . DATE OF BIRTH . CALL BACK NUMBER . REASON FOR CALL**this is important as we prioritize the call backs  YOU WILL RECEIVE A CALL BACK THE SAME DAY AS LONG AS YOU CALL BEFORE 4:00 PM  At the [facility], you and your health needs are our priority. As part of our continuing mission to provide you with exceptional heart care, we have created designated Provider Care Teams. These Care Teams include your primary Cardiologist (physician) and Advanced Practice Providers (APPs- Physician Assistants and Nurse Practitioners) who all work together to provide you with the care you need, when you need it.   You may see any of the following providers on your designated Care Team at your next follow up: [address] [phone] . Dr [name] . Arvilla Meres, NP . Marca Ancona, PA . Tonye Becket, PharmD   Please be sure to bring  in all your medications bottles to every appointment.

## 2019-09-15 ENCOUNTER — Encounter: Payer: Self-pay | Admitting: Plastic Surgery

## 2019-09-15 ENCOUNTER — Ambulatory Visit (INDEPENDENT_AMBULATORY_CARE_PROVIDER_SITE_OTHER): Payer: BC Managed Care – PPO | Admitting: Plastic Surgery

## 2019-09-15 ENCOUNTER — Other Ambulatory Visit: Payer: Self-pay

## 2019-09-15 VITALS — BP 99/69 | HR 82 | Temp 98.5°F

## 2019-09-15 DIAGNOSIS — T8130XD Disruption of wound, unspecified, subsequent encounter: Secondary | ICD-10-CM

## 2019-09-15 NOTE — Progress Notes (Signed)
Patient has postop from split-thickness skin graft to his abdominal wound.  The wound VAC was removed showing good take of the graft centrally with some maceration around the borders.  All the staples were removed.  The donor site is healing fine.  I recommended daily dressing changes with Vaseline, Adaptic and 4 x 4's and tape.  Patient's familiar with this wound care regimen and can perform it himself.  We will plan to see him again in 2 weeks.  I am hopeful everything will be totally healed up by then.  All of his questions were answered.

## 2019-09-20 ENCOUNTER — Telehealth: Payer: Self-pay | Admitting: Surgical

## 2019-09-20 NOTE — Telephone Encounter (Signed)
Patient called to follow up on the the supplies from [employer]. He said he wasn't sure if it had been done because he has not heard anything from our office and usually [facility] pretty fast. Please call at [phone] to advise.

## 2019-09-22 ENCOUNTER — Telehealth: Payer: Self-pay | Admitting: *Deleted

## 2019-09-22 NOTE — Telephone Encounter (Signed)
Faxed order to [facility] for medical supplies for the patient.  Confirmation received and copy scanned into the chart.//AB/CMA     Supplies:Adaptic                Gauze (4x4)                Tape

## 2019-09-23 ENCOUNTER — Telehealth: Payer: Self-pay | Admitting: Plastic Surgery

## 2019-09-23 NOTE — Telephone Encounter (Signed)
Called and spoke with the patient on (09/22/19) and informed him that the order not been sent yet.  Needing more information from the doctor.  Once I receive the information I will be faxing the order.    Patient verbalized understanding and agreed.  Order faxed to [facility] on (09/22/19) and confirmation received.   Received Order Status Notification:Attendtion, there are some issues delaying service to your patient.  [facility] has provided service for the patient; no further action is required.   We have attempted to contact the patient regarding purchasing their supplies.  We have been unable to reach them at this time.//AB/CMA

## 2019-09-23 NOTE — Telephone Encounter (Signed)
Patient calling regarding his supplies. He is almost out and won't have enough for the weekend. Please call patient on mobile to advise. He has not heard from [facility].

## 2019-09-23 NOTE — Telephone Encounter (Signed)
Called and spoke with the patient and he stated that he called [facility] but was not able to speak with anyone.  I informed him that the order had been faxed, and we received the order status notification stating that they have attempted to contact the patient regarding purchasing their supplies.  We have been unable to reach them at this time.    Asked the patient to give [facility] a call back to see which supplies his insurance will not cover.  Patient verbalized understanding and agreed.  Informed the patient to give me a call back if he has an other questions.//AB/CMA

## 2019-09-29 ENCOUNTER — Other Ambulatory Visit: Payer: Self-pay

## 2019-09-29 ENCOUNTER — Encounter: Payer: Self-pay | Admitting: Surgical

## 2019-09-29 ENCOUNTER — Ambulatory Visit (INDEPENDENT_AMBULATORY_CARE_PROVIDER_SITE_OTHER): Payer: BC Managed Care – PPO | Admitting: Surgical

## 2019-09-29 VITALS — BP 106/71 | HR 85 | Temp 98.9°F

## 2019-09-29 DIAGNOSIS — T8130XD Disruption of wound, unspecified, subsequent encounter: Secondary | ICD-10-CM

## 2019-09-29 NOTE — Progress Notes (Signed)
Patient is a 54 year old male here for follow-up after split-thickness skin graft to his abdominal wound with Dr. Arita Miss on 09/05/2019.  Roberto [name] reports he is overall doing well, he reports he just recently received the Adaptic supplies in the mail, there was some hold-up with [facility], this has been resolved.  He reports he is feeling well, has no complaints.  On exam he has a few small areas of exposed granulation tissue, on the right side there is an area that is approximately 1 cm x 0.4 cm, good base of granulation tissue without any surrounding erythema.  No sign of infection, no cellulitic changes, no purulence or malodor is noted.  Recommend continuing with Vaseline, Adaptic, 4 x 4 gauze and tape. Recommend following up in 3 weeks for reevaluation, call with any questions or concerns  Pictures were obtained of the patient and placed in the chart with the patient's or guardian's permission.

## 2019-10-07 ENCOUNTER — Other Ambulatory Visit: Payer: Self-pay | Admitting: Internal Medicine

## 2019-10-11 ENCOUNTER — Telehealth: Payer: Self-pay | Admitting: Surgical

## 2019-10-11 NOTE — Telephone Encounter (Signed)
Returned Andre's call at ext [phone]. LMVM that patient wound vac was removed on 09/15/2019. Patient does not need a prescription to continue having a wound vac

## 2019-10-11 NOTE — Telephone Encounter (Signed)
Debby Bud, with [employer], called to see if we received the prescription to continue wound vac for patient. It was sent last week. Please call Debby Bud back to advise if it's been received (#[phone] ext [phone]) or fax it back to [phone].

## 2019-10-12 ENCOUNTER — Telehealth: Payer: Self-pay

## 2019-10-12 NOTE — Telephone Encounter (Signed)
Faxed to [employer]- a" Do Not Renew" order for wound vac- per/signed by Dr. Arita Miss Notes indicate that the wound vac was placed on day of surgery 09/05/19 & removed/discontinued on 09/15/19 Fax confirmation received

## 2019-10-19 ENCOUNTER — Other Ambulatory Visit (HOSPITAL_COMMUNITY): Payer: Self-pay | Admitting: *Deleted

## 2019-10-19 MED ORDER — SACUBITRIL-VALSARTAN 24-26 MG PO TABS: 1.0000 | ORAL_TABLET | Freq: Two times a day (BID) | ORAL | 5 refills | Status: DC

## 2019-10-19 MED ORDER — DIGOXIN 125 MCG PO TABS
0.1250 mg | ORAL_TABLET | Freq: Every day | ORAL | 3 refills | Status: DC
Start: 2019-10-19 — End: 2020-06-22

## 2019-10-19 MED ORDER — POTASSIUM CHLORIDE CRYS ER 20 MEQ PO TBCR: 20.0000 meq | EXTENDED_RELEASE_TABLET | Freq: Every day | ORAL | 5 refills | Status: DC

## 2019-10-21 ENCOUNTER — Ambulatory Visit (INDEPENDENT_AMBULATORY_CARE_PROVIDER_SITE_OTHER): Payer: BC Managed Care – PPO | Admitting: Surgical

## 2019-10-21 ENCOUNTER — Other Ambulatory Visit: Payer: Self-pay

## 2019-10-21 ENCOUNTER — Encounter: Payer: Self-pay | Admitting: Surgical

## 2019-10-21 VITALS — HR 73 | Temp 98.5°F

## 2019-10-21 DIAGNOSIS — T8130XD Disruption of wound, unspecified, subsequent encounter: Secondary | ICD-10-CM

## 2019-10-21 NOTE — Progress Notes (Signed)
Patient is a 54 year old male here for follow-up after skin graft to his abdominal wound on 09/05/2019 with Dr. Arita Miss. He reports he is doing well, has no complaints today, reports he has noticed an improvement. He reports that the left thigh wound has healed completely and is doing well. He has been applying Adaptic and Gauze daily.   He reports that general surgery is waiting for this to heal so he can have his ostomy reversal.<BR > On exam new epithelium noted, small amount of granulation tissue left to epithelialize along the right side and left side. There is no surrounding erythema . He does have what appears to be an abrasion/skin tear from removal of tape at approximately 2 o'clock superior to the abdominal wound/graft site  I recommend continuing with Vaseline and Adaptic daily. I suspect this will go on to heal up in the next week or so. There's  no sign of infection. I recommend calling with questions or concerns, scheduled follow-up for three weeks for reevaluation.  Picture was taken and placed in the chart with the patient's consent.

## 2019-10-28 ENCOUNTER — Ambulatory Visit (INDEPENDENT_AMBULATORY_CARE_PROVIDER_SITE_OTHER): Payer: BC Managed Care – PPO

## 2019-10-28 DIAGNOSIS — I428 Other cardiomyopathies: Secondary | ICD-10-CM

## 2019-10-29 LAB — CUP PACEART REMOTE DEVICE CHECK
Battery Remaining Longevity: 129 mo
Battery Voltage: 3.03 V
Brady Statistic RV Percent Paced: 0.02 %
Date Time Interrogation Session: 20211015012504
HighPow Impedance: 76 Ohm
Implantable Lead Implant Date: 20200821
Implantable Lead Location: 753860
Implantable Lead Model: 6935
Implantable Pulse Generator Implant Date: 20200821
Lead Channel Impedance Value: 342 Ohm
Lead Channel Impedance Value: 456 Ohm
Lead Channel Pacing Threshold Amplitude: 0.75 V
Lead Channel Pacing Threshold Pulse Width: 0.4 ms
Lead Channel Sensing Intrinsic Amplitude: 6.375 mV
Lead Channel Sensing Intrinsic Amplitude: 6.375 mV
Lead Channel Setting Pacing Amplitude: 2 V
Lead Channel Setting Pacing Pulse Width: 0.4 ms
Lead Channel Setting Sensing Sensitivity: 0.3 mV

## 2019-11-02 NOTE — Progress Notes (Signed)
Remote ICD transmission.   

## 2019-11-06 ENCOUNTER — Other Ambulatory Visit: Payer: Self-pay | Admitting: Family Medicine

## 2019-11-11 ENCOUNTER — Telehealth: Payer: Self-pay | Admitting: *Deleted

## 2019-11-11 NOTE — Telephone Encounter (Signed)
   [facility] Pre-operative Risk Assessment    HEARTCARE STAFF: - Please ensure there is not already an duplicate clearance open for this procedure. - Under Visit Info/Reason for Call, type in Other and utilize the format Clearance MM/DD/YY or Clearance TBD. Do not use dashes or single digits. - If request is for dental extraction, please clarify the # of teeth to be extracted.  Request for surgical clearance:  1. What type of surgery is being performed? OSTOMY TAKEDOWN   2. When is this surgery scheduled? TBD   3. What type of clearance is required (medical clearance vs. Pharmacy clearance to hold med vs. Both)? BOTH  4. Are there any medications that need to be held prior to surgery and how long? ELIQUIS   5. Practice name and name of physician performing surgery? [facility]; DR. Rodman [place]   6. What is the office phone number? [phone]   7.   What is the office fax number? [place], CMA  8.   Anesthesia type (None, local, MAC, general) ? GENERAL   Julaine Hua 11/11/2019, 3:20 PM  _________________________________________________________________   (provider comments below)

## 2019-11-14 NOTE — Telephone Encounter (Signed)
Patient with diagnosis of afib, LV mural thrombus, prior 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, + lupus anticoagulant on Eliquis for anticoagulation.    Procedure: ostomy takedown Date of procedure: TBD  CHA2DS2-VASc Score = 4  This indicates a 4.8% annual risk of stroke. [name] score is based upon: CHF History: 1 HTN History: 0 Diabetes History: 0 Stroke History: 2 (recurrent VTE) Vascular Disease History: 1 Age Score: 0 Gender Score: 0  CrCl 36mL/min using adjusted body weight Platelet count 173K  Per office protocol, patient can hold Eliquis for 1 day prior to procedure.

## 2019-11-15 ENCOUNTER — Ambulatory Visit: Payer: BC Managed Care – PPO | Admitting: Surgical

## 2019-11-15 NOTE — Telephone Encounter (Signed)
Ok to proceed. 

## 2019-11-15 NOTE — Telephone Encounter (Signed)
   Primary Cardiologist: Arvilla Meres, MD  Chart reviewed as part of pre-operative protocol coverage. Patient was contacted 11/15/2019 in reference to pre-operative risk assessment for pending surgery as outlined below.  Roberto [name] was last seen on 09/13/2019 by Dr. Gala [name].  Since that day, Roberto [name] has done well without chest pain or significant shortness of breath. Roberto [name] has gone back to working and exercising regularly. He denies any CHF symptom. He says he is able to do 35 minutes of bicycle and 20 minutes on the treadmill without any issue. He is clearly able to do more than 4 METS of activity.  Therefore, based on ACC/AHA guidelines, the patient would be at acceptable risk for the planned procedure without further cardiovascular testing.   Dr. Gala [name], he has a yearly echocardiogram scheduled for 12/14/2019, do you recommend move up the echocardiogram so it will be done prior to the ostomy takedown? Or can he proceed with surgery without the repeat echo?  Please forward your response to P CV DIV PREOP  Azalee Course, PA 11/15/2019, 3:07 PM

## 2019-11-23 ENCOUNTER — Other Ambulatory Visit (HOSPITAL_COMMUNITY): Payer: Self-pay | Admitting: *Deleted

## 2019-11-23 MED ORDER — AMIODARONE HCL 200 MG PO TABS
200.0000 mg | ORAL_TABLET | Freq: Every day | ORAL | 5 refills | Status: DC
Start: 2019-11-23 — End: 2020-05-30

## 2019-11-25 ENCOUNTER — Emergency Department (HOSPITAL_BASED_OUTPATIENT_CLINIC_OR_DEPARTMENT_OTHER)
Admission: EM | Admit: 2019-11-25 | Discharge: 2019-11-25 | Disposition: A | Discharge status: Home / Self Care | Payer: BC Managed Care – PPO | Attending: Emergency Medicine | Admitting: Emergency Medicine

## 2019-11-25 ENCOUNTER — Encounter (HOSPITAL_BASED_OUTPATIENT_CLINIC_OR_DEPARTMENT_OTHER): Payer: Self-pay | Admitting: Emergency Medicine

## 2019-11-25 ENCOUNTER — Other Ambulatory Visit: Payer: Self-pay

## 2019-11-25 DIAGNOSIS — Z950 Presence of cardiac pacemaker: Secondary | ICD-10-CM | POA: Insufficient documentation

## 2019-11-25 DIAGNOSIS — R0602 Shortness of breath: Secondary | ICD-10-CM | POA: Insufficient documentation

## 2019-11-25 DIAGNOSIS — I2510 Atherosclerotic heart disease of native coronary artery without angina pectoris: Secondary | ICD-10-CM | POA: Insufficient documentation

## 2019-11-25 DIAGNOSIS — Z7901 Long term (current) use of anticoagulants: Secondary | ICD-10-CM | POA: Diagnosis not present

## 2019-11-25 DIAGNOSIS — R002 Palpitations: Secondary | ICD-10-CM | POA: Insufficient documentation

## 2019-11-25 DIAGNOSIS — Z86711 Personal history of pulmonary embolism: Secondary | ICD-10-CM | POA: Diagnosis not present

## 2019-11-25 DIAGNOSIS — I5042 Chronic combined systolic (congestive) and diastolic (congestive) heart failure: Secondary | ICD-10-CM | POA: Insufficient documentation

## 2019-11-25 DIAGNOSIS — Z96651 Presence of right artificial knee joint: Secondary | ICD-10-CM | POA: Diagnosis not present

## 2019-11-25 LAB — BASIC METABOLIC PANEL
Anion gap: 9 (ref 5–15)
BUN: 20 mg/dL (ref 6–20)
CO2: 27 mmol/L (ref 22–32)
Calcium: 8 mg/dL — ABNORMAL LOW (ref 8.9–10.3)
Chloride: 103 mmol/L (ref 98–111)
Creatinine, Ser: 1.31 mg/dL — ABNORMAL HIGH (ref 0.61–1.24)
GFR, Estimated: >60 mL/min (ref >60)
Glucose, Bld: 134 mg/dL — ABNORMAL HIGH (ref 70–99)
Potassium: 3.7 mmol/L (ref 3.5–5.1)
Sodium: 139 mmol/L (ref 135–145)

## 2019-11-25 LAB — CBC WITH DIFFERENTIAL/PLATELET
Abs Immature Granulocytes: 0.17 10*3/uL — ABNORMAL HIGH (ref 0.00–0.07)
Basophils Absolute: 0.1 10*3/uL (ref 0.0–0.1)
Basophils Relative: 1 %
Eosinophils Absolute: 0.2 10*3/uL (ref 0.0–0.5)
Eosinophils Relative: 2 %
HCT: 44.1 % (ref 39.0–52.0)
Hemoglobin: 14.1 g/dL (ref 13.0–17.0)
Immature Granulocytes: 2 %
Lymphocytes Relative: 21 %
Lymphs Abs: 2.2 10*3/uL (ref 0.7–4.0)
MCH: 29.4 pg (ref 26.0–34.0)
MCHC: 32 g/dL (ref 30.0–36.0)
MCV: 91.9 fL (ref 80.0–100.0)
Monocytes Absolute: 1 10*3/uL (ref 0.1–1.0)
Monocytes Relative: 10 %
Neutro Abs: 6.8 10*3/uL (ref 1.7–7.7)
Neutrophils Relative %: 64 %
Platelets: 149 10*3/uL — ABNORMAL LOW (ref 150–400)
RBC: 4.8 MIL/uL (ref 4.22–5.81)
RDW: 13.5 % (ref 11.5–15.5)
WBC: 10.4 10*3/uL (ref 4.0–10.5)
nRBC: 0 % (ref 0.0–0.2)

## 2019-11-25 NOTE — ED Provider Notes (Signed)
[facility] EMERGENCY DEPARTMENT Provider Note   CSN: [id number] Arrival date & time: 11/25/19  0057     History Chief Complaint  Patient presents with  . Palpitations  . Shortness of Breath    Roberto [name] is a 54 y.o. male.  Patient is a 54 year old male with extensive past medical history including nonischemic cardiomyopathy with reduced EF and pacemaker/defibrillator placement, congestive heart failure, prior pulmonary embolism on Eliquis, SVT.  He presents today for evaluation of palpitations.  Patient laid down on to sleep this evening, then woke up with rapid heartbeat and palpitations in his chest.  This lasted for approximately 1 minute, then resolved when he stood up.  Several minutes later, he had a second episode that lasted for approximately 1 minute, then resolved.  He presents for evaluation of this.  He denies experiencing any pain during this episode.  He denies feeling short of breath.  He denies any increased swelling in his legs.  Patient did tell me he ran out of his amiodarone several days ago, but was able to refill and resume taking this medication yesterday.  The history is provided by the patient.  Palpitations Palpitations quality:  Fast Onset quality:  Sudden Duration:  1 minute Timing:  Constant Progression:  Resolved Chronicity:  New Relieved by:  Nothing Worsened by:  Nothing Ineffective treatments:  None tried Associated symptoms: shortness of breath   Shortness of Breath      Past Medical History:  Diagnosis Date  . AICD (automatic cardioverter/defibrillator) present 08/2018  . Arthritis   . Chronic combined systolic and diastolic CHF (congestive heart failure) (HCC)   . Coronary artery disease   . Deep vein thrombosis (HCC)   . Diverticulitis   . Hepatitis   . Lupus anticoagulant positive   . Morbid obesity (HCC)   . Morbid obesity with BMI of 50.0-59.9, adult (HCC)   . Mural thrombus of heart    coumadin  .  Nonischemic cardiomyopathy (HCC)    a) 12/17/11 echo: LVEF 25-30%, grade 3 diastolic dysfunction (c/w restriction), mod MR, mod LA/LV and mild RA dilatation; b) 12/18/11 cMRI: LVEF 38%, mod LV/mild RV dilatation, global HK, mild-mod RV sys dysfxn, no LV thrombus & patchy non-subendocardial delayed enhancement c/w infil dz or prior myocarditis; c. 07/2018 EF 25-30%.  . Paroxysmal SVT (supraventricular tachycardia) (HCC)   . Pneumonia   . Pulmonary embolism (HCC)    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  . Wears glasses   . Wound of abdomen     Patient Active Problem List   Diagnosis Date Noted  . Hypomagnesemia   . Acute on chronic systolic heart failure (HCC)   . Respiratory failure (HCC)   . Bowel obstruction (HCC)   . Pre-operative cardiovascular examination   . Acute diverticulitis 12/20/2018  . 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 History of DVT (deep vein thrombosis) 09/04/2018  . PAF (paroxysmal atrial fibrillation) (HCC) 09/04/2018  . Rash 05/06/2018  . Elevated uric acid in blood 05/06/2018  . Atypical atrial flutter (HCC) 03/17/2017  . Chronic pain of right knee 03/13/2017  . Typical atrial flutter s/p catheter ablation   . Varicose veins of legs 12/17/2015  . Encounter for health maintenance examination in adult 12/17/2015  . Special screening for malignant neoplasms, colon 12/17/2015  . Vaccine counseling 12/17/2015  . Screening for prostate cancer 12/17/2015  . Knee deformity, acquired, right 12/17/2015  . Gait disturbance 12/17/2015  . Osteoarthritis  of right knee 12/17/2015  . Varicose veins of lower extremities with ulcer (HCC) 09/18/2015  . Need for prophylactic vaccination and inoculation against influenza 09/18/2015  . Tinea versicolor 06/15/2015  . Impaired fasting blood sugar 12/28/2014  . Long term current use of anticoagulant therapy 12/28/2014  . Erectile dysfunction 02/19/2012  . Chronic systolic heart failure (HCC) 12/29/2011  .  Hypokalemia 12/24/2011  . Lupus anticoagulant positive 12/22/2011  . Nonischemic cardiomyopathy (HCC) 12/16/2011  . Paroxysmal SVT (supraventricular tachycardia) (HCC) 12/16/2011  . History of venous thromboembolism 12/16/2011  . Venous insufficiency 11/04/2011  . Obesity, morbid (HCC) 06/02/2008    Past Surgical History:  Procedure Laterality Date  . A-FLUTTER ABLATION N/A 11/17/2016   Procedure: A-FLUTTER ABLATION;  Surgeon: Regan Lemming, MD;  Location: [facility] INVASIVE CV LAB;  Service: Cardiovascular;  Laterality: N/A;  . APPLICATION OF WOUND VAC N/A 09/05/2019   Procedure: APPLICATION OF WOUND VAC;  Surgeon: Allena Napoleon, MD;  Location: [facility] OR;  Service: Plastics;  Laterality: N/A;  PATIENT'S HOME WOUND VAC APPLIED.  [name] BIOPSY  12/23/2018   Procedure: BIOPSY;  Surgeon: Meridee Score Netty Starring., MD;  Location: [facility] ENDOSCOPY;  Service: Gastroenterology;;  . CARDIAC CATHETERIZATION  06/2006   Angiographically normal cors  . CARDIAC CATHETERIZATION  12/22/2011   R/LHC: normal cors, well-compensated HDs, LV dysfxn  . CARDIOVERSION N/A 10/17/2016   Procedure: CARDIOVERSION;  Surgeon: Dolores Patty, MD;  Location: [facility] ENDOSCOPY;  Service: Cardiovascular;  Laterality: N/A;  . CARDIOVERSION N/A 03/20/2017   Procedure: CARDIOVERSION;  Surgeon: Dolores Patty, MD;  Location: [facility] ENDOSCOPY;  Service: Cardiovascular;  Laterality: N/A;  . COLECTOMY N/A 12/28/2018   Procedure: TOTAL COLECTOMY;  Surgeon: Emelia Loron, MD;  Location: [facility] OR;  Service: General;  Laterality: N/A;  . FLEXIBLE SIGMOIDOSCOPY N/A 12/23/2018   Procedure: FLEXIBLE SIGMOIDOSCOPY;  Surgeon: Lemar Lofty., MD;  Location: [facility] ENDOSCOPY;  Service: Gastroenterology;  Laterality: N/A;  . ICD IMPLANT N/A 09/03/2018   Procedure: ICD IMPLANT;  Surgeon: Regan Lemming, MD;  Location: [facility] INVASIVE CV LAB;  Service: Cardiovascular;  Laterality: N/A;  . ILEOSTOMY N/A 12/28/2018   Procedure: ILEOSTOMY;  Surgeon:  Emelia Loron, MD;  Location: [facility] OR;  Service: General;  Laterality: N/A;  . IR RADIOLOGIST EVAL & MGMT  02/08/2019  . JOINT REPLACEMENT     right knee  . LEFT AND RIGHT HEART CATHETERIZATION WITH CORONARY ANGIOGRAM N/A 12/22/2011   Procedure: LEFT AND RIGHT HEART CATHETERIZATION WITH CORONARY ANGIOGRAM;  Surgeon: Dolores Patty, MD;  Location: [facility] CATH LAB;  Service: Cardiovascular;  Laterality: N/A;  . PATELLAR TENDON REPAIR     Left  . RIGHT HEART CATH N/A 10/20/2016   Procedure: RIGHT HEART CATH;  Surgeon: Dolores Patty, MD;  Location: [facility] INVASIVE CV LAB;  Service: Cardiovascular;  Laterality: N/A;  . SKIN SPLIT GRAFT N/A 09/05/2019   Procedure: SKIN GRAFT SPLIT THICKNESS;  Surgeon: Allena Napoleon, MD;  Location: [facility] OR;  Service: Plastics;  Laterality: N/A;  . TEE WITHOUT CARDIOVERSION N/A 03/20/2017   Procedure: TRANSESOPHAGEAL ECHOCARDIOGRAM (TEE);  Surgeon: Dolores Patty, MD;  Location: [facility] ENDOSCOPY;  Service: Cardiovascular;  Laterality: N/A;       Family History  Problem Relation Age of Onset  . Hypertension Mother   . Clotting disorder Mother        mom with PEs  . Cancer Mother        uterine cancer  . Diabetes Father   . Heart attack Father   .  Hypertension Other        Sibling  . Diabetes Other   . Obesity Other   . Diabetes Sister   . Obesity Sister   . Obesity Sister   . Colon cancer Neg Hx   . Stomach cancer Neg Hx   . Esophageal cancer Neg Hx   . Rectal cancer Neg Hx   . Liver cancer Neg Hx     Social History   Tobacco Use  . Smoking status: Never Smoker  . Smokeless tobacco: Never Used  Vaping Use  . Vaping Use: Never used  Substance Use Topics  . Alcohol use: Not Currently    Comment: Rarely  . Drug use: No    Home Medications Prior to Admission medications   Medication Sig Start Date End Date Taking? Authorizing Provider  acetaminophen (TYLENOL) 500 MG tablet Take 1 tablet (500 mg total) by mouth every 6 (six) hours as  needed. For use AFTER surgery 08/26/19   Eldridge Abrahams, PA-C  amiodarone (PACERONE) 200 MG tablet Take 1 tablet (200 mg total) by mouth daily. 11/23/19   Bensimhon, Bevelyn Buckles, MD  apixaban (ELIQUIS) 5 MG TABS tablet Take 1 tablet (5 mg total) by mouth 2 (two) times daily. 09/01/19   Bensimhon, Bevelyn Buckles, MD  carvedilol (COREG) 3.125 MG tablet TAKE 1 TABLET(3.125 MG) BY MOUTH TWICE DAILY WITH A MEAL 10/07/19   Bensimhon, Bevelyn Buckles, MD  Cholecalciferol (VITAMIN D3 PO) Take 450 mg by mouth in the morning and at bedtime.    [provider]  coconut oil OIL Apply 1 application topically daily as needed (dry skin).     [provider]  digoxin (LANOXIN) 0.125 MG tablet Take 1 tablet (0.125 mg total) by mouth daily. 10/19/19   Bensimhon, Bevelyn Buckles, MD  potassium chloride SA (KLOR-CON) 20 MEQ tablet Take 1 tablet (20 mEq total) by mouth daily. 10/19/19   Bensimhon, Bevelyn Buckles, MD  sacubitril-valsartan (ENTRESTO) 24-26 MG Take 1 tablet by mouth 2 (two) times daily. 10/19/19   Bensimhon, Bevelyn Buckles, MD  spironolactone (ALDACTONE) 25 MG tablet TAKE 1 TABLET(25 MG) BY MOUTH DAILY 11/07/19   [name], MD  torsemide (DEMADEX) 20 MG tablet Take 1 tablet (20 mg total) by mouth 2 (two) times daily. Take an extra tablet as needed 08/12/19   Robbie Lis M, PA-C    Allergies    Shrimp [shellfish allergy]  Review of Systems   Review of Systems  Respiratory: Positive for shortness of breath.   Cardiovascular: Positive for palpitations.  All other systems reviewed and are negative.   Physical Exam Updated Vital Signs BP 92/78   Pulse 78   Temp 98.3 F (36.8 C) (Oral)   Resp 18   Ht 5\' 8"  (1.727 m)   Wt (!) 151.5 kg   SpO2 96%   BMI 50.78 kg/m   Physical Exam Vitals and nursing note reviewed.  Constitutional:      General: He is not in acute distress.    Appearance: He is well-developed. He is not diaphoretic.  HENT:     Head: Normocephalic and atraumatic.  Cardiovascular:      Rate and Rhythm: Normal rate and regular rhythm.     Heart sounds: No murmur heard.  No friction rub.  Pulmonary:     Effort: Pulmonary effort is normal. No respiratory distress.     Breath sounds: Normal breath sounds. No wheezing or rales.  Abdominal:     General: Bowel sounds are  normal. There is no distension.     Palpations: Abdomen is soft.     Tenderness: There is no abdominal tenderness.  Musculoskeletal:        General: Normal range of motion.     Cervical back: Normal range of motion and neck supple.     Right lower leg: No tenderness. Edema present.     Left lower leg: No tenderness. Edema present.     Comments: There is 1+ pitting edema of both lower extremities.  Skin:    General: Skin is warm and dry.  Neurological:     Mental Status: He is alert and oriented to person, place, and time.     Coordination: Coordination normal.     ED Results / Procedures / Treatments   Labs (all labs ordered are listed, but only abnormal results are displayed) Labs Reviewed - No data to display  EKG EKG Interpretation  Date/Time:  Friday November 25 2019 01:06:16 EST Ventricular Rate:  75 PR Interval:    QRS Duration: 122 QT Interval:  411 QTC Calculation: 460 R Axis:   -73 Text Interpretation: Sinus rhythm Multiform ventricular premature complexes Borderline prolonged PR interval Consider left atrial enlargement Nonspecific IVCD with LAD Anteroseptal infarct, old No significant change since 03/31/2019 Confirmed by Geoffery Lyons ([id number]) on 11/25/2019 2:19:20 AM   Radiology No results found.  Procedures Procedures (including critical care time)  Medications Ordered in ED Medications - No data to display  ED Course  I have reviewed the triage vital signs and the nursing notes.  Pertinent labs & imaging results that were available during my care of the patient were reviewed by me and considered in my medical decision making (see chart for details).    MDM  Rules/Calculators/A&P  Patient with history of extensive cardiac disease as described in the HPI presenting with complaints of palpitations.  He experienced 2 episodes, each lasting approximately 1 minute where his heart was fluttering.  He has had no additional episodes here in the ER.  He is now feeling fine with no complaints.  His EKG is unchanged.  Laboratory studies are reassuring.  He did have some hypotension while here in the ER lying flat, however blood pressure improved to 100 with ambulation.  He is not symptomatic with his blood pressure and tells me that he feels fine.  At this point, I feel as though discharge is appropriate.  Patient to follow-up with his cardiologist in the next few days and return to the ER if symptoms worsen or change.  Final Clinical Impression(s) / ED Diagnoses Final diagnoses:  None    Rx / DC Orders ED Discharge Orders    None       Geoffery Lyons, MD 11/25/19 0410

## 2019-11-25 NOTE — Discharge Instructions (Addendum)
Continue medications as previously prescribed. ° °Follow-up with your cardiologist in the next few days, and return to the ER if symptoms significantly worsen or change. °

## 2019-11-25 NOTE — ED Triage Notes (Signed)
Pt reports shob x 2 days, worse with exertion. Pt c/o heart palpitations since last night. Pt was out of his amiodarone x 2 days and just got rx filled yesterday.

## 2019-12-01 ENCOUNTER — Ambulatory Visit (INDEPENDENT_AMBULATORY_CARE_PROVIDER_SITE_OTHER): Payer: BC Managed Care – PPO | Admitting: Gastroenterology

## 2019-12-01 ENCOUNTER — Telehealth: Payer: Self-pay

## 2019-12-01 ENCOUNTER — Encounter: Payer: Self-pay | Admitting: Gastroenterology

## 2019-12-01 ENCOUNTER — Telehealth: Payer: Self-pay | Admitting: Gastroenterology

## 2019-12-01 VITALS — BP 90/60 | HR 77 | Ht 68.0 in | Wt 350.0 lb

## 2019-12-01 DIAGNOSIS — Z8719 Personal history of other diseases of the digestive system: Secondary | ICD-10-CM | POA: Diagnosis not present

## 2019-12-01 DIAGNOSIS — Z939 Artificial opening status, unspecified: Secondary | ICD-10-CM | POA: Diagnosis not present

## 2019-12-01 DIAGNOSIS — R933 Abnormal findings on diagnostic imaging of other parts of digestive tract: Secondary | ICD-10-CM

## 2019-12-01 DIAGNOSIS — Z9049 Acquired absence of other specified parts of digestive tract: Secondary | ICD-10-CM | POA: Diagnosis not present

## 2019-12-01 DIAGNOSIS — Z86010 Personal history of colonic polyps: Secondary | ICD-10-CM

## 2019-12-01 DIAGNOSIS — Z7901 Long term (current) use of anticoagulants: Secondary | ICD-10-CM

## 2019-12-01 MED ORDER — SUPREP BOWEL PREP KIT 17.5-3.13-1.6 GM/177ML PO SOLN: 1.0000 | ORAL | 0 refills | Status: DC

## 2019-12-01 NOTE — Telephone Encounter (Signed)
Pharmacy please comment on holding anticoagulation.  Corine Shelter PA-C 12/01/2019 1:29 PM

## 2019-12-01 NOTE — Telephone Encounter (Signed)
Request for surgical clearance:     Endoscopy Procedure  What type of surgery is being performed?     Colon +Flex Sig   When is this surgery scheduled?     01/16/2020  What type of clearance is required ?   Pharmacy  Are there any medications that need to be held prior to surgery and how long? Eliquis x2 days prior to procedure   Practice name and name of physician performing surgery?      [facility]-Dr Mansouraty   What is your office phone and fax number?      Phone- [phone]  [phone]  Anesthesia type (None, local, MAC, general) ?       MAC

## 2019-12-01 NOTE — Telephone Encounter (Signed)
Patient with diagnosis of afib, LV mural thrombus, prior 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, + lupus anticoagulant on Eliquis for anticoagulation.  Procedure: Colon +Flex Sig  Date of procedure: 01/16/20   CHA2DS2-VASc Score = 4  This indicates a 4.8% annual risk of stroke. [name] score is based upon: CHF History: 1 HTN History: 0 Diabetes History: 0 Stroke History: 2 (recurrent VTE) Vascular Disease History: 1 Age Score: 0 Gender Score: 0   CrCl 95 mL/min using adjusted body weight Platelet count 149K  Per office protocol, patient can hold Eliquis for 1 day prior to procedure.

## 2019-12-01 NOTE — Telephone Encounter (Signed)
   Primary Cardiologist: Arvilla Meres, MD  Chart reviewed as part of pre-operative protocol coverage. Given past medical history and time since last visit, based on ACC/AHA guidelines, Roberto [name] would be at acceptable risk for the planned procedure without further cardiovascular testing.   Our recommendation is to hold Eliquis one day pre op and resume as soon as possible post op.  If a longer hold is required that will need to be cleared by Dr Gala Romney.   The patient was advised that if he develops new symptoms prior to surgery to contact our office to arrange for a follow-up visit, and he verbalized understanding.  I will route this recommendation to the requesting party via Epic fax function and remove from pre-op pool.  Please call with questions.  Corine Shelter, PA-C 12/01/2019, 4:26 PM

## 2019-12-01 NOTE — Telephone Encounter (Signed)
The pt was seen in the office today please send to [place]

## 2019-12-01 NOTE — Patient Instructions (Addendum)
If you are age 54 or younger, your body mass index should be between 19-25. Your Body mass index is 53.22 kg/m. If this is out of the aformentioned range listed, please consider follow up with your Primary Care Provider.   You have been scheduled for an endoscopy and colonoscopy. Please follow the written instructions given to you at your visit today. Please pick up your prep supplies at the pharmacy within the next 1-3 days. If you use inhalers (even only as needed), please bring them with you on the day of your procedure.   We have sent the following medications to your pharmacy for you to pick up at your convenience: Suprep   You will be contaced by our office prior to your procedure for directions on holding your Eliquis. If you do not hear from our office 1 week prior to your scheduled procedure, please call [phone] to discuss.   Thank you for choosing me and [facility].  Dr. Meridee Score

## 2019-12-01 NOTE — Telephone Encounter (Signed)
Pt is requesting to reschedule his colonoscopy scheduled for 1/3 to 1/10 if possible at the hospital.

## 2019-12-02 NOTE — Telephone Encounter (Signed)
Pt is requesting a call back from a nurse (missed call) 

## 2019-12-02 NOTE — Telephone Encounter (Signed)
Returned call.  Left message

## 2019-12-02 NOTE — Telephone Encounter (Signed)
Please advise 

## 2019-12-02 NOTE — Telephone Encounter (Signed)
Thanks for update. OK for 1 day hold prior to procedure and hold the day of procedure and restart to be determined after procedure is complete. Thank you. GM

## 2019-12-04 NOTE — Progress Notes (Addendum)
[place] VISIT   Primary Care Provider Denita Lung, [address] [place] [id number] [phone]  Patient Profile: Roberto [name] is a 54 y.o. 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  The patient presents to the [facility] for an evaluation and management of problem(s) noted below:  Problem List 1. Hx of diverticulitis of colon   2. History of colon resection   3. History of creation of ostomy ([place])   4. History of colonic polyps   5. Abnormal CT scan, gastrointestinal tract   6. Chronic anticoagulation     History of Present Illness This is a patient that I met in December 2020 who presented to the hospital with progressive constipation and subsequent development of pain leading to endoscopic evaluation after cross-sectional imaging suggested colonic dilation and a potential transition zone concerning for active diverticulitis versus malignancy.  Flexible sigmoidoscopy showed evidence of colonic dilation as well as significant compression in the sigmoid region with findings of likely active diverticulitis with pus found in setting of diverticula.  There is also a polypoid lesion that was noted in the region similar to that.  Patient was not able to be adequately treated just with antibiotics and this led to surgical intervention with the finding of a cecal perforation as well as active diverticulitis and abscess.  The patient underwent a subtotal colectomy with what looks to be an end ileostomy end ileostomy.  He was eventually discharged.  He has had a long road in regards to the abdominal wound being open and  present.  He eventually underwent plastic surgery evaluation and intervention in an effort of trying to get this area completely sealed off.  Finally after many months things look to be doing better.  He changes his ostomy bag a few times per day.  He notes no blood.  Abdominal pain is present but bearable.  He has not had any significant leakage of fluid outside of his rectum.  He is interested in an attempt at see if being put back together in regards to his ostomy and rectosigmoid region is possible.  Patient denies any significant heartburn symptoms.  He remains on anticoagulation.  GI Review of Systems Positive as above Negative for dysphagia, odynophagia, nausea, vomiting  Review of Systems General: Denies fevers/chills/weight loss unintentionally Cardiovascular: Denies current chest pain/palpitations Pulmonary: Denies shortness of breath Gastroenterological: See HPI Genitourinary: Denies darkened urine Hematological: Positive for easy bruising/bleeding due to anticoagulation Dermatological: Positive for areas of skin breakdown overall changes in his abdominal cavity as result of his prior intervention; denies jaundice Psychological: Mood is stable   Medications Current Outpatient Medications  Medication Sig Dispense Refill  . acetaminophen (TYLENOL) 500 MG tablet Take 1 tablet (500 mg total) by mouth every 6 (six) hours as needed. For use AFTER surgery 30 tablet 0  . amiodarone (PACERONE) 200 MG tablet Take 1 tablet (200 mg total) by mouth daily. 30 tablet 5  . apixaban (ELIQUIS) 5 MG TABS tablet Take 1 tablet (5 mg total) by mouth 2 (two) times daily. 60 tablet 5  . carvedilol (COREG) 3.125 MG tablet TAKE 1 TABLET(3.125 MG) BY MOUTH TWICE DAILY WITH A MEAL 60 tablet 3  . Cholecalciferol (VITAMIN D3 PO) Take 450 mg by mouth  in the morning and at bedtime.    . coconut oil OIL Apply 1 application topically daily as needed (dry skin).     [name] digoxin (LANOXIN) 0.125 MG tablet Take 1  tablet (0.125 mg total) by mouth daily. 30 tablet 3  . potassium chloride SA (KLOR-CON) 20 MEQ tablet Take 1 tablet (20 mEq total) by mouth daily. 30 tablet 5  . sacubitril-valsartan (ENTRESTO) 24-26 MG Take 1 tablet by mouth 2 (two) times daily. 60 tablet 5  . spironolactone (ALDACTONE) 25 MG tablet TAKE 1 TABLET(25 MG) BY MOUTH DAILY 90 tablet 0  . torsemide (DEMADEX) 20 MG tablet Take 1 tablet (20 mg total) by mouth 2 (two) times daily. Take an extra tablet as needed 75 tablet 5  . Na Sulfate-K Sulfate-Mg Sulf (SUPREP BOWEL PREP KIT) 17.5-3.13-1.6 GM/177ML SOLN Take 1 kit by mouth as directed. For colonoscopy prep 354 mL 0   No current facility-administered medications for this visit.    Allergies Allergies  Allergen Reactions  . Shrimp [Shellfish Allergy] Hives and Itching    Histories Past Medical History:  Diagnosis Date  . AICD (automatic cardioverter/defibrillator) present 08/2018  . Arthritis   . Chronic combined systolic and diastolic CHF (congestive heart failure) ([place])   . Coronary artery disease   . Deep vein thrombosis ([place])   . Diverticulitis   . Hepatitis   . Lupus anticoagulant positive   . Morbid obesity ([place])   . Morbid obesity with BMI of 50.0-59.9, adult ([place])   . [place]ural thrombus of heart    coumadin  . Nonischemic cardiomyopathy ([place])    a) 12/17/11 echo: LVEF 17-40%, grade 3 diastolic dysfunction (c/w restriction), mod MR, mod LA/LV and mild RA dilatation; b) 12/18/11 cMRI: LVEF 38%, mod LV/mild RV dilatation, global HK, mild-mod RV sys dysfxn, no LV thrombus & patchy non-subendocardial delayed enhancement c/w infil dz or prior myocarditis; c. 07/2018 EF 25-30%.  . Paroxysmal SVT (supraventricular tachycardia) ([place])   . Pneumonia   . Pulmonary embolism ([place])    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  . Wears glasses   . Wound of abdomen    Past Surgical History:  Procedure Laterality Date  . A-FLUTTER ABLATION N/A 11/17/2016   Procedure: A-FLUTTER ABLATION;   Surgeon: Constance Haw, MD;  Location: [place] [facility];  Service: Cardiovascular;  Laterality: N/A;  . APPLICATION OF WOUND VAC N/A 09/05/2019   Procedure: APPLICATION OF WOUND VAC;  Surgeon: Cindra Presume, MD;  Location: [facility];  Service: Plastics;  Laterality: N/A;  PATIENT'S HOME WOUND VAC APPLIED.  [name] BIOPSY  12/23/2018   Procedure: BIOPSY;  Surgeon: Rush Landmark Telford Nab., MD;  Location: [facility];  Service: Gastroenterology;;  . CARDIAC CATHETERIZATION  06/2006   Angiographically normal cors  . CARDIAC CATHETERIZATION  12/22/2011   R/LHC: normal cors, well-compensated HDs, LV dysfxn  . CARDIOVERSION N/A 10/17/2016   Procedure: CARDIOVERSION;  Surgeon: Jolaine Artist, MD;  Location: [facility] ENDOSCOPY;  Service: Cardiovascular;  Laterality: N/A;  . CARDIOVERSION N/A 03/20/2017   Procedure: CARDIOVERSION;  Surgeon: Jolaine Artist, MD;  Location: [facility] ENDOSCOPY;  Service: Cardiovascular;  Laterality: N/A;  . COLECTOMY N/A 12/28/2018   Procedure: TOTAL COLECTOMY;  Surgeon: Rolm Bookbinder, MD;  Location: [facility];  Service: General;  Laterality: N/A;  . FLEXIBLE SIGMOIDOSCOPY N/A 12/23/2018   Procedure: FLEXIBLE SIGMOIDOSCOPY;  Surgeon: Irving Copas., MD;  Location: [facility];  Service: Gastroenterology;  Laterality: N/A;  . ICD IMPLANT N/A 09/03/2018  Procedure: ICD IMPLANT;  Surgeon: Constance Haw, MD;  Location: [place] [facility] CV LAB;  Service: Cardiovascular;  Laterality: N/A;  . ILEOSTOMY N/A 12/28/2018   Procedure: ILEOSTOMY;  Surgeon: Rolm Bookbinder, MD;  Location: [facility];  Service: General;  Laterality: N/A;  . IR RADIOLOGIST EVAL & MGMT  02/08/2019  . JOINT REPLACEMENT     right knee  . LEFT AND RIGHT HEART CATHETERIZATION WITH CORONARY ANGIOGRAM N/A 12/22/2011   Procedure: LEFT AND RIGHT HEART CATHETERIZATION WITH CORONARY ANGIOGRAM;  Surgeon: Jolaine Artist, MD;  Location: [facility] CATH LAB;  Service: Cardiovascular;  Laterality: N/A;  . PATELLAR  TENDON REPAIR     Left  . RIGHT HEART CATH N/A 10/20/2016   Procedure: RIGHT HEART CATH;  Surgeon: Jolaine Artist, MD;  Location: [place] CV LAB;  Service: Cardiovascular;  Laterality: N/A;  . SKIN SPLIT GRAFT N/A 09/05/2019   Procedure: SKIN GRAFT SPLIT THICKNESS;  Surgeon: Cindra Presume, MD;  Location: [place] [facility];  Service: Plastics;  Laterality: N/A;  . TEE WITHOUT CARDIOVERSION N/A 03/20/2017   Procedure: TRANSESOPHAGEAL ECHOCARDIOGRAM (TEE);  Surgeon: Jolaine Artist, MD;  Location: [facility] END[facility]COPY;  Service: Cardiovascular;  Laterality: N/A;   Social History   Socioeconomic History  . Marital status: Divorced    Spouse name: Not on file  . Number of children: 5  . Years of education: Not on file  . Highest education level: Not on file  Occupational History  . Occupation: Systems developer for [employer]: [employer]  Tobacco Use  . Smoking status: Never Smoker  . Smokeless tobacco: Never Used  Vaping Use  . Vaping Use: Never used  Substance and Sexual Activity  . Alcohol use: Not Currently    Comment: Rarely  . Drug use: No  . Sexual activity: Not on file  Other Topics Concern  . Not on file  Social History Narrative   Lives in [place] 03/2015, 5 children.   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  Exercises with weights and some walking. 12/2015   Social Determinants of Health   Financial Resource Strain:   . Difficulty of Paying Living Expenses: Not on file  Food Insecurity:   . Worried About Charity fundraiser in the Last Year: Not on file  . Ran Out of Food in the Last Year: Not on file  Transportation Needs:   . Lack of Transportation (Medical): Not on file  . Lack of Transportation (Non-Medical): Not on file  Physical Activity:   . Days of Exercise per Week: Not on file  . Minutes of Exercise per Session: Not on file  Stress:   . Feeling of Stress : Not on file  Social  Connections:   . Frequency of Communication with Friends and Family: Not on file  . Frequency of Social Gatherings with Friends and Family: Not on file  . Attends Religious Services: Not on file  . Active Member of Clubs or Organizations: Not on file  . Attends Archivist Meetings: Not on file  . Marital Status: Not on file  Intimate Partner Violence:   . Fear of Current or Ex-Partner: Not on file  . Emotionally Abused: Not on file  . Physically Abused: Not on file  . Sexually Abused: Not on file   Family History  Problem Relation Age of Onset  . Hypertension Mother   . Clotting disorder Mother  mom with PEs  . Cancer Mother        uterine cancer  . Diabetes Father   . Heart attack Father   . Hypertension Other        Sibling  . Diabetes Other   . Obesity Other   . Diabetes Sister   . Obesity Sister   . Obesity Sister   . Colon cancer Neg Hx   . Stomach cancer Neg Hx   . Esophageal cancer Neg Hx   . Rectal cancer Neg Hx   . Inflammatory bowel disease Neg Hx   . Liver disease Neg Hx   . Pancreatic cancer Neg Hx    I have reviewed his medical, social, and family history in detail and updated the electronic medical record as necessary.    PHYSICAL EXAMINATION  BP 90/60   Pulse 77   Ht _0  (1.727 m)   Wt (!) 350 lb (158.8 kg)   BMI 53.22 kg/m  Wt Readings from Last 3 Encounters:  12/01/19 (!) 350 lb (158.8 kg)  11/25/19 (!) 334 lb (151.5 kg)  09/13/19 (!) 335 lb 3.2 oz (152 kg)  GEN: NAD, appears stated age, doesn't appear chronically ill PSYCH: Cooperative, without pressured speech EYE: Conjunctivae pink, sclerae anicteric ENT: MMM CV: Nontachycardic RESP: No audible wheezing GI: NABS, protuberant abdomen, rounded, obese, skin graft noted in anterior abdomen with concavity, ostomy in place without any evidence of erythema or breakdown in that region, unable to appreciate hepatosplenomegaly due to body habitus  MSK/EXT: Bilateral lower extremity  edema present SKIN: No jaundice NEURO:  Alert & Oriented x 3, no focal deficits   REVIEW OF DATA  I reviewed the following data at the time of this encounter:  GI Procedures and Studies  December 2020 flexible sigmoidoscopy - Preparation of the colon was inadequate. - Hemorrhoids found on digital rectal exam. - Stool in the entire examined colon. Lavaged with fair though inadequate preparation overall for colon polyp detection. - One 8 mm polyp at the recto-sigmoid colon. Not removed due to the poor overall preparation noted in the colon. - Diverticulosis in the recto-sigmoid colon and in the sigmoid colon. - Congested mucosa from 29 to 34 cm proximal to the anus. Biopsied to rule out dysplasia, though does not have typical appearance. Purulent discharge was seen in association with a presumed diverticular opening, suspicious of active diverticulitis. I suspect this is the etiology of the patient's issues currently. - Dilated in the descending colon just proximal to the congested mucosa. Suction via endoscope to decompress as much as possible with removal of at least 1.2 L of stool. - Non-bleeding non-thrombosed internal hemorrhoids.  Laboratory Studies  No relevant studies to review  Imaging Studies  January 2021 CT abdomen pelvis with contrast IMPRESSION: 1. Interval reduction/resolution of air and fluid containing collection along the right mid hemiabdomen following percutaneous drainage catheter placement. No new definable/drainable fluid collections within the abdomen or pelvis. 2. Redemonstrated dehiscence of midline abdominal incision with small amount of debris about the caudal aspect of the open incision without definable/drainable fluid collection. 3. Stable sequela of subtotal colectomy and end ileostomy without evidence of enteric obstruction. 4. Significant involution previously characterized hepatic hemangioma compared to remote examination performed 2010, currently  measuring 3.7 cm, previously, 7.8 cm   ASSESSMENT  Mr. Lizak is a 54 y.o. 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  The patient is seen today for evaluation and management of:  1. Hx of diverticulitis of colon   2. History of colon resection   3. History of creation of ostomy ([place])   4. History of colonic polyps   5. Abnormal CT scan, gastrointestinal tract   6. Chronic anticoagulation    The patient is hemodynamically stable.  Clinically he seems to be much better over the course of the last few months although I have not seen him really since his flexible sigmoidoscopy in 2020.  With that being said having read through the notes in chart it does seem that the patient has an end ileostomy and underwent an extended right and partial left colectomy.  He has what looks to be a rectosigmoid/rectal stump at this time.  As the patient and his surgeon, Dr. Donne Hazel, would like to consider the potential for reanastomosis I think it is reasonable to move forward with evaluation and flexible sigmoidoscopy at a minimum.  I will confirm that the patient has no right colon or transverse colon to his ostomy and if that is the case then he will need a flexible sigmoidoscopy and enemas to clear him out for his procedure.  Due to his medical comorbidities and his BMI he will need to have his procedure done in the hospital-based outpatient setting.  If any polyps remain has had been seen during his flexible sigmoidoscopy we will attempt endoscopic resection otherwise will update the surgeons in regards to our findings should there be any concern about potential for having reanastomosis.  The risks and  benefits of endoscopic evaluation were discussed with the patient; these include but are not limited to the risk of perforation, infection, bleeding, missed lesions, lack of diagnosis, severe illness requiring hospitalization, as well as anesthesia and sedation related illnesses.  The patient is agreeable to proceed.  As the patient has not had cross-sectional imaging almost a year I would like to repeat that prior to [place] having his procedure completed to give [place] as much information as possible will work on having that cross-sectional CT scan as well prior to his flexible sigmoidoscopy if possible.  All patient questions were answered to the best of my ability, and the patient agrees to the aforementioned plan of action with follow-up as indicated.   PLAN  Proceed with scheduling flexible sigmoidoscopy after confirmation that: Evaluation through ostomy is not required (based on notation in chart it looks like he has an end ileostomy) We will obtain cardiology approval for Eliquis hold per standard protocol CT abdomen/pelvis to be obtained to ensure no other abnormalities found prior to patient's potential need for reanastomosis   Orders Placed This Encounter  Procedures  . Procedural/ Surgical Case Request: COLONOSCOPY WITH PROPOFOL, FLEXIBLE SIGMOIDOSCOPY  . Ambulatory referral to Gastroenterology    New Prescriptions   NA SULFATE-K SULFATE-MG SULF (SUPREP BOWEL PREP KIT) 17.5-3.13-1.6 GM/177ML SOLN    Take 1 kit by mouth as directed. For colonoscopy prep   Modified Medications   No medications on file    Planned Follow Up No follow-ups on file.   Total Time in Face-to-Face and in Coordination of Care for patient including independent/personal interpretation/review of prior testing, medical history, examination, medication adjustment, communicating results with the patient directly, and documentation with the EHR is 30 minutes.   Justice Britain, MD [facility]  Endoscopy Office #  [phone]

## 2019-12-05 NOTE — Telephone Encounter (Signed)
Returned pt call. Unable to reach pt.

## 2019-12-05 NOTE — Telephone Encounter (Signed)
Pt is requesting a call back to reschedule his procedure from 01/16/20

## 2019-12-06 ENCOUNTER — Encounter: Payer: Self-pay | Admitting: Gastroenterology

## 2019-12-06 ENCOUNTER — Telehealth: Payer: Self-pay | Admitting: Gastroenterology

## 2019-12-06 DIAGNOSIS — Z9049 Acquired absence of other specified parts of digestive tract: Secondary | ICD-10-CM | POA: Insufficient documentation

## 2019-12-06 DIAGNOSIS — R933 Abnormal findings on diagnostic imaging of other parts of digestive tract: Secondary | ICD-10-CM | POA: Insufficient documentation

## 2019-12-06 DIAGNOSIS — Z86010 Personal history of colonic polyps: Secondary | ICD-10-CM | POA: Insufficient documentation

## 2019-12-06 DIAGNOSIS — Z939 Artificial opening status, unspecified: Secondary | ICD-10-CM | POA: Insufficient documentation

## 2019-12-06 DIAGNOSIS — Z8719 Personal history of other diseases of the digestive system: Secondary | ICD-10-CM | POA: Insufficient documentation

## 2019-12-06 NOTE — Telephone Encounter (Signed)
Returned pt call. Pt has been r/s from 01/16/20 to 01/23/20 at [facility]. Pt will have covid test on 01/19/20 @ 1:10pm. New instructions will sent to pt via my chart.

## 2019-12-06 NOTE — Telephone Encounter (Signed)
See previous note

## 2019-12-06 NOTE — Telephone Encounter (Signed)
Pt has been informed to hold Eliquis x1 day.

## 2019-12-10 NOTE — Progress Notes (Signed)
Advanced Heart Failure Clinic Note   Patient ID: Roberto [name], male   DOB: [birth year], 54 y.o.   MRN: [id number] PCP: Dr Jacqulyn Bath Cardiologist: Dr [name] EP: Dr Johney Frame AHF: Dr. Gala Romney   HPI: Roberto [name] is a 54 y.o. 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   Admitted to [facility] 12/16/11 for recurrent HF. Cath with no CAD.   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 EF dropped from 40%-> 15%. Underwent successful DCCV. Started on amiodarone.  S/p AFL ablation 11/17/2016  Pt underwent outpatient DCCV 03/20/17, then came back that evening with worsening SOB and CP. Diuresed with IV lasix. Also treated for cellulitis of his RLE, and gout. Discharge weight 280 lbs.  Echo 7/20: EF 25-30%   Admitted 12/19/18- 01/24/19 for acute diverticulits. Failed IV abx. Underwent R colectomy with ileostomy.  In OR patient became hypotensive, required pressors. Course c/b perforated R colon with right lateral peritoneal fluid collection s/p right lateral abdominal drain placement in IR 01/04/2019. Post-op struggling with non-healing ab wound and  underwent skin grafting on 09/05/19.  Here today for f/u. Having SOB in the AM that quickly resolves, feels fluid is up, but still able to exercise on TM and bike 2x/week.  Having to take 2 extra torsemide starting Sunday, and taking all other medications without adverse effects.  Working full-time at [employer] and picked up a part-time job for [employer] .No CP, dizziness, palpations, PND/orthopnea. Weights at home averaging 346 lbs. Does not check BP at home. Plan to have colostomy reversed in January 2022.   Cardiac studies:  Echo today 12/21: EF 20-25%, RV ok, personally reviewed  Echo 07/2017 EF appears slightly improved to 20-25%. Possible clot (vs artifact in IVC/RA junction) Personally reviewed  Echo 10/15/16 - LVEF  15%  TEE 03/20/17 LVEF 15-20%, Severe reduced RV function  RHC 10/20/16  RA = 3 RV = 45/7 PA = 49/20 (31) PCW = 28 Fick cardiac output/index = 5.0/2.1 PVR = 0.5 WU Ao sat = 95% PA sat = 64%, 61%  12/17/11 ECHO 25-30%   05/20/2012 ECHO EF 40-45%  CARDIAC MRI - 12/18/11  1. Moderately dilated left ventricle with moderately decreased systolic function, EF 38%. Global hypokinesis.  2. Mildly dilated right ventricle with mild to moderately decreased systolic function.  3. No definite LV thrombus noted.  4. Patchy non-subendocardial delayed enhancement seen in the ventricular septum (see above for description). This is not suggestive of a coronary disease pattern. This could be suggestive of infiltrative disease versus prior myocarditis.  RHC/LHC 12/22/11  Cors: Normal  SH: Lives with his wife and 5 children. Works full time running a Daycare  Review of systems complete and found to be negative unless listed in HPI.    Past Medical History:  Diagnosis Date  . AICD (automatic cardioverter/defibrillator) present 08/2018  . Arthritis   . Chronic combined systolic and diastolic CHF (congestive heart failure) (HCC)   . Coronary artery disease   . Deep vein thrombosis (HCC)   . Diverticulitis   . Hepatitis   . Lupus anticoagulant positive   . Morbid obesity (HCC)   . Morbid obesity with BMI of 50.0-59.9, adult (HCC)   . Mural thrombus of heart    coumadin  . Nonischemic cardiomyopathy (HCC)    a) 12/17/11 echo: LVEF 25-30%, grade 3 diastolic dysfunction (c/w restriction), mod MR,  mod LA/LV and mild RA dilatation; b) 12/18/11 cMRI: LVEF 38%, mod LV/mild RV dilatation, global HK, mild-mod RV sys dysfxn, no LV thrombus & patchy non-subendocardial delayed enhancement c/w infil dz or prior myocarditis; c. 07/2018 EF 25-30%.  . Paroxysmal SVT (supraventricular tachycardia) (HCC)   . Pneumonia   . Pulmonary embolism (HCC)    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  . Wears glasses   . Wound of  abdomen     Current Outpatient Medications  Medication Sig Dispense Refill  . acetaminophen (TYLENOL) 500 MG tablet Take 1 tablet (500 mg total) by mouth every 6 (six) hours as needed. For use AFTER surgery 30 tablet 0  . amiodarone (PACERONE) 200 MG tablet Take 1 tablet (200 mg total) by mouth daily. 30 tablet 5  . apixaban (ELIQUIS) 5 MG TABS tablet Take 1 tablet (5 mg total) by mouth 2 (two) times daily. 60 tablet 5  . carvedilol (COREG) 3.125 MG tablet TAKE 1 TABLET(3.125 MG) BY MOUTH TWICE DAILY WITH A MEAL 60 tablet 3  . coconut oil OIL Apply 1 application topically daily as needed (dry skin).     [name] digoxin (LANOXIN) 0.125 MG tablet Take 1 tablet (0.125 mg total) by mouth daily. 30 tablet 3  . potassium chloride SA (KLOR-CON) 20 MEQ tablet Take 1 tablet (20 mEq total) by mouth daily. 30 tablet 5  . sacubitril-valsartan (ENTRESTO) 24-26 MG Take 1 tablet by mouth 2 (two) times daily. 60 tablet 5  . spironolactone (ALDACTONE) 25 MG tablet TAKE 1 TABLET(25 MG) BY MOUTH DAILY 90 tablet 0  . torsemide (DEMADEX) 20 MG tablet Take 1 tablet (20 mg total) by mouth 2 (two) times daily. Take an extra tablet as needed 75 tablet 5  . metolazone (ZAROXOLYN) 2.5 MG tablet Take 1 tablet (2.5 mg total) by mouth daily. 10 tablet 0   No current facility-administered medications for this encounter.   PHYSICAL EXAM: Vitals:   12/14/19 1001  BP: 140/82  Pulse: 67  SpO2: 96%  Weight: (!) 157.7 kg (347 lb 9.6 oz)   Wt Readings from Last 3 Encounters:  12/14/19 (!) 157.7 kg (347 lb 9.6 oz)  12/01/19 (!) 158.8 kg (350 lb)  11/25/19 (!) 151.5 kg (334 lb)    PHYSICAL EXAM: General:  Obese, well appearing. No resp difficulty HEENT: normal Neck: supple. Difficult to assess JVD with thick neck. Carotids 2+ bilat; no bruits. No lymphadenopathy or thryomegaly appreciated. Cor: PMI nondisplaced. Regular rate & rhythm, heart tones distant. No rubs, gallops or murmurs. Lungs: clear Abdomen: obese, soft,  nontender, nondistended. No hepatosplenomegaly. No bruits or masses. Good bowel sounds. Extremities: no cyanosis, clubbing, rash, 2-3+ LEE Neuro: alert & orientedx3, cranial nerves grossly intact. moves all 4 extremities w/o difficulty. Affect pleasant  ICD interrogation: No VT/AF. Volume drifting above threshold but flattening out, daily activity ~2.5-3 hrs. Personally reviewed  ASSESSMENT & PLAN:  1. Chronic Systolic Heart Failure: - Echo 10/15/16 - LVEF 15% - TEE 03/2017 LVEF 15-20% - Echo 07/2017 with LVEF 20-25% - Echo 7/20 EF 25-30% - Echo 12/21 EF 25-3-% - Stable NYHA Class II - Volume status elevated by exam & Optivol - Continue torsemide 40 mg daily - Continue spiro 25 mg daily - Continue digoxin 0.125 mg daily. Check dig level  - Continue carvedilol 3.125 bid - Increase Entresto to 49/51mg  bid - Start Metolazone 2.5mg  x1 today +40 mEq KCl to reset fluid; may take weekly if needed - Would add [place] after ileostomy  reversal in Jan 2022 - ICD checked in clinic today. No VT/AF. Volume up but stable. Activity level 2-3 hrs/day  - BMET and Dig level today  2. H/O Acute diverticulitis with colon resection and ileostomy - non-healing wound. S/p skin grafting 09/05/19 - abdominal wound healed, plans for reversal of ileostomy in January 2022  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 - Maintaining NSR. - Currently on amiodarone 200 mg daily and Eliquis 5 bid; no bleeding issues - CBC today.   4. Obesity: - Body mass index is 52.85 kg/m.  - continue attempts at weight loss  Arvilla Meres, MD  10:55 AM

## 2019-12-12 ENCOUNTER — Telehealth: Payer: Self-pay

## 2019-12-12 DIAGNOSIS — Z86010 Personal history of colonic polyps: Secondary | ICD-10-CM

## 2019-12-12 DIAGNOSIS — Z939 Artificial opening status, unspecified: Secondary | ICD-10-CM

## 2019-12-12 DIAGNOSIS — Z8719 Personal history of other diseases of the digestive system: Secondary | ICD-10-CM

## 2019-12-12 DIAGNOSIS — Z9049 Acquired absence of other specified parts of digestive tract: Secondary | ICD-10-CM

## 2019-12-12 DIAGNOSIS — R933 Abnormal findings on diagnostic imaging of other parts of digestive tract: Secondary | ICD-10-CM

## 2019-12-12 NOTE — Telephone Encounter (Signed)
-----   Message from Lemar Lofty., MD sent at 12/11/2019  9:23 PM EST ----- Regarding: RE: [facility] patient Perfect.   Tomisha Reppucci, patient only needs an enema for flex sigmoidoscopy. No other preparation required. GM ----- Message ----- From: Emelia Loron, MD Sent: 12/11/2019   8:22 PM EST To: Lemar Lofty., MD Subject: RE: [facility] patient                             That is correct Gabe. Thanks ----- Message ----- From: Lemar Lofty., MD Sent: 12/06/2019   3:15 AM EST To: Emelia Loron, MD Subject: [facility] patient                                 Matt,Thanks for sending Mr. Marijo File back to me.I read through the final procedure note and just wanted to make sure I was on the right page.He has an end ileostomy and just has a rectal/rectosigmoid stump.Is that correct?If it is then he only needs a flexible sigmoidoscopy and does not need a ostomy/colon evaluation since it looks like to the entire right colon due to the perforation.Just wanted to make sure so that I do not have to over prep him if that is the case.I think he is set up due to his weight in his availability for January and I will update you based on what I see and ensure that that polyp is gone or to get it taken care of as long as I think it can be removed.Appreciate the help as always.Let me know if I can be of any assistance to him or any other patients that she have.GM

## 2019-12-12 NOTE — Telephone Encounter (Signed)
Pt will be informed only use Flex sig prep. RX for suprep will be cancelled at pharmacy.

## 2019-12-12 NOTE — Addendum Note (Signed)
Addended by: Lucky Rathke on: 12/12/2019 11:13 AM   Modules accepted: Orders

## 2019-12-12 NOTE — Telephone Encounter (Addendum)
Pt has been informed of CT scan appointment. Pt will come by office on 01/16/20 for labs( Bun/Cret) and pick up oral contrast.Per pt he does not have to pre-medicate before having oral contrast for CT.   You have been scheduled for a CT scan of the abdomen and pelvis at [facility] ([address]---this is in the same building as [facility] [employer]).   You are scheduled on 01/19/20 at 11:30am. You should arrive 15 minutes prior to your appointment time for registration. Please follow the written instructions below on the day of your exam:  WARNING: IF YOU ARE ALLERGIC TO IODINE/X-RAY DYE, PLEASE NOTIFY RADIOLOGY IMMEDIATELY AT [phone]! YOU WILL BE GIVEN A 13 HOUR PREMEDICATION PREP.  1) Do not eat or drink anything after 7:30am (4 hours prior to your test) 2) You have been given 2 bottles of oral contrast to drink. The solution may taste better if refrigerated, but do NOT add ice or any other liquid to this solution. Shake well before drinking.    Drink 1 bottle of contrast @ 9:30an (2 hours prior to your exam)  Drink 1 bottle of contrast @ 10:30am (1 hour prior to your exam)  You may take any medications as prescribed with a small amount of water, if necessary. If you take any of the following medications: METFORMIN, GLUCOPHAGE, GLUCOVANCE, AVANDAMET, RIOMET, FORTAMET, [place] MET, JANUMET, GLUMETZA or METAGLIP, you MAY be asked to HOLD this medication 48 hours AFTER the exam.  The purpose of you drinking the oral contrast is to aid in the visualization of your intestinal tract. The contrast solution may cause some diarrhea. Depending on your individual set of symptoms, you may also receive an intravenous injection of x-ray contrast/dye. Plan on being at [facility] for 30 minutes or longer, depending on the type of exam you are having performed.  This test typically takes 30-45 minutes to complete.  If you have any questions regarding your exam or if you need to  reschedule, you may call the CT department at [phone] between the hours of 8:00 am and 5:00 pm, Monday-Friday.  ________________________________________________________________________

## 2019-12-14 ENCOUNTER — Other Ambulatory Visit: Payer: Self-pay

## 2019-12-14 ENCOUNTER — Ambulatory Visit (HOSPITAL_BASED_OUTPATIENT_CLINIC_OR_DEPARTMENT_OTHER)
Admission: RE | Admit: 2019-12-14 | Discharge: 2019-12-14 | Disposition: A | Discharge status: Home / Self Care | Payer: BC Managed Care – PPO | Source: Ambulatory Visit | Attending: Internal Medicine | Admitting: Internal Medicine

## 2019-12-14 ENCOUNTER — Ambulatory Visit (HOSPITAL_COMMUNITY)
Admission: RE | Admit: 2019-12-14 | Discharge: 2019-12-14 | Disposition: A | Discharge status: Home / Self Care | Payer: BC Managed Care – PPO | Source: Ambulatory Visit | Attending: Internal Medicine | Admitting: Internal Medicine

## 2019-12-14 ENCOUNTER — Encounter (HOSPITAL_COMMUNITY): Payer: Self-pay | Admitting: Internal Medicine

## 2019-12-14 VITALS — BP 140/82 | HR 67 | Wt 347.6 lb

## 2019-12-14 DIAGNOSIS — Z7901 Long term (current) use of anticoagulants: Secondary | ICD-10-CM | POA: Diagnosis not present

## 2019-12-14 DIAGNOSIS — Z95810 Presence of automatic (implantable) cardiac defibrillator: Secondary | ICD-10-CM | POA: Diagnosis not present

## 2019-12-14 DIAGNOSIS — Z6841 Body Mass Index (BMI) 40.0 and over, adult: Secondary | ICD-10-CM | POA: Diagnosis not present

## 2019-12-14 DIAGNOSIS — I471 Supraventricular tachycardia: Secondary | ICD-10-CM | POA: Insufficient documentation

## 2019-12-14 DIAGNOSIS — Z79899 Other long term (current) drug therapy: Secondary | ICD-10-CM | POA: Diagnosis not present

## 2019-12-14 DIAGNOSIS — Z932 Ileostomy status: Secondary | ICD-10-CM | POA: Diagnosis not present

## 2019-12-14 DIAGNOSIS — I5022 Chronic systolic (congestive) heart failure: Secondary | ICD-10-CM

## 2019-12-14 DIAGNOSIS — Z86718 Personal history of other venous thrombosis and embolism: Secondary | ICD-10-CM | POA: Diagnosis not present

## 2019-12-14 DIAGNOSIS — I483 Typical atrial flutter: Secondary | ICD-10-CM | POA: Diagnosis not present

## 2019-12-14 DIAGNOSIS — Z9049 Acquired absence of other specified parts of digestive tract: Secondary | ICD-10-CM | POA: Diagnosis not present

## 2019-12-14 DIAGNOSIS — I428 Other cardiomyopathies: Secondary | ICD-10-CM | POA: Insufficient documentation

## 2019-12-14 DIAGNOSIS — I4892 Unspecified atrial flutter: Secondary | ICD-10-CM | POA: Insufficient documentation

## 2019-12-14 DIAGNOSIS — Z86711 Personal history of pulmonary embolism: Secondary | ICD-10-CM | POA: Insufficient documentation

## 2019-12-14 DIAGNOSIS — I872 Venous insufficiency (chronic) (peripheral): Secondary | ICD-10-CM | POA: Insufficient documentation

## 2019-12-14 DIAGNOSIS — I480 Paroxysmal atrial fibrillation: Secondary | ICD-10-CM | POA: Diagnosis not present

## 2019-12-14 LAB — CBC
HCT: 47.6 % (ref 39.0–52.0)
Hemoglobin: 15.1 g/dL (ref 13.0–17.0)
MCH: 29.3 pg (ref 26.0–34.0)
MCHC: 31.7 g/dL (ref 30.0–36.0)
MCV: 92.2 fL (ref 80.0–100.0)
Platelets: 158 10*3/uL (ref 150–400)
RBC: 5.16 MIL/uL (ref 4.22–5.81)
RDW: 13.5 % (ref 11.5–15.5)
WBC: 9.1 10*3/uL (ref 4.0–10.5)
nRBC: 0 % (ref 0.0–0.2)

## 2019-12-14 LAB — BASIC METABOLIC PANEL
Anion gap: 12 (ref 5–15)
BUN: 18 mg/dL (ref 6–20)
CO2: 29 mmol/L (ref 22–32)
Calcium: 8.4 mg/dL — ABNORMAL LOW (ref 8.9–10.3)
Chloride: 100 mmol/L (ref 98–111)
Creatinine, Ser: 1.57 mg/dL — ABNORMAL HIGH (ref 0.61–1.24)
GFR, Estimated: 52 mL/min — ABNORMAL LOW (ref >60)
Glucose, Bld: 144 mg/dL — ABNORMAL HIGH (ref 70–99)
Potassium: 3.5 mmol/L (ref 3.5–5.1)
Sodium: 141 mmol/L (ref 135–145)

## 2019-12-14 LAB — ECHOCARDIOGRAM COMPLETE
Area-P 1/2: 3.02 cm2
S' Lateral: 4.9 cm

## 2019-12-14 LAB — DIGOXIN LEVEL: Digoxin Level: 0.9 ng/mL — ABNORMAL LOW (ref 1.0–2.0)

## 2019-12-14 MED ORDER — METOLAZONE 2.5 MG PO TABS: 2.5000 mg | ORAL_TABLET | Freq: Every day | ORAL | 0 refills | Status: DC

## 2019-12-14 MED ORDER — ENTRESTO 49-51 MG PO TABS
1.0000 | ORAL_TABLET | Freq: Two times a day (BID) | ORAL | 4 refills | Status: DC
Start: 2019-12-14 — End: 2020-04-19

## 2019-12-14 MED ORDER — DEXTROSE 50 % IV SOLN
INTRAVENOUS | Status: AC
  Filled 2019-12-14: qty 50

## 2019-12-14 MED ORDER — POTASSIUM CHLORIDE CRYS ER 20 MEQ PO TBCR: 20.0000 meq | EXTENDED_RELEASE_TABLET | Freq: Every day | ORAL | 5 refills | Status: DC

## 2019-12-14 NOTE — Patient Instructions (Signed)
Take Metolazone 2.5mg  (1 tablet) TODAY ONLY   Take Potassium (2 tablets) with the metolazone  INCREASE Entresto 49/51mg  (1 tablet) Twice daily  Labs done today, your results will be available in MyChart, we will contact you for abnormal readings.  Your physician recommends that you schedule a follow-up appointment in: 2 months  If you have any questions or concerns before your next appointment please send [place] a message through [place] or call our office at [phone].    TO LEAVE A MESSAGE FOR THE NURSE SELECT OPTION 2, PLEASE LEAVE A MESSAGE INCLUDING: . YOUR NAME . DATE OF BIRTH . CALL BACK NUMBER . REASON FOR CALL**this is important as we prioritize the call backs  YOU WILL RECEIVE A CALL BACK THE SAME DAY AS LONG AS YOU CALL BEFORE 4:00 PM

## 2019-12-14 NOTE — Addendum Note (Signed)
Encounter addended by: Dolores Patty, MD on: 12/14/2019 12:15 PM  Actions taken: Level of Service modified, Visit diagnoses modified, Charge Capture section accepted

## 2019-12-14 NOTE — Progress Notes (Signed)
°  Echocardiogram 2D Echocardiogram has been performed.  Celene Skeen 12/14/2019, 10:07 AM

## 2019-12-20 ENCOUNTER — Telehealth (HOSPITAL_COMMUNITY): Payer: Self-pay | Admitting: *Deleted

## 2019-12-20 NOTE — Telephone Encounter (Signed)
Pt left VM needing to schedule hospital follow up. I called pt to offer him an appt with APP for hospital f/u but no answer/left vm for pt to return call to schedule hosp f/u.

## 2019-12-21 ENCOUNTER — Telehealth: Payer: Self-pay | Admitting: Emergency Medicine

## 2019-12-21 ENCOUNTER — Ambulatory Visit (INDEPENDENT_AMBULATORY_CARE_PROVIDER_SITE_OTHER): Payer: BC Managed Care – PPO

## 2019-12-21 DIAGNOSIS — Z95810 Presence of automatic (implantable) cardiac defibrillator: Secondary | ICD-10-CM

## 2019-12-21 DIAGNOSIS — I5022 Chronic systolic (congestive) heart failure: Secondary | ICD-10-CM

## 2019-12-21 NOTE — Telephone Encounter (Signed)
Reviewed [facility] driving restrictions with patient due to shock received 12/16/19. Patient was seen in ED after event. He reports syncopal event and will be following up with Heart Failure Clinic per Randon Goldsmith. Shock plan reviewed with patient.

## 2019-12-21 NOTE — Progress Notes (Unsigned)
EPIC Encounter for ICM Monitoring  Patient Name: Roberto [name] is a 54 y.o. male Date: 12/21/2019 Primary Care Physican: [name], MD Primary Cardiologist: Bensimhon Electrophysiologist: Elberta Fortis 12/21/2019 Weight: 338.6 lbs   VT-NS (>4 beats, >200 bpm) 1 Time in AF <0.1 hr/day (0.3%)       Spoke with patient and provided ICM intro.  He agreed to monthly ICM follow up.    Explained Dr Gala Romney requested a follow up report after taking Metolazone. He reports he misunderstood the Metolazone direction and took one x 2 days instead the just 1 dose that Dr Gala Romney prescribed.    He had defibrillator shock on 12/3 after 2nd day of Metolazone.  Non Weaver thought hypokalemia caused shock.  Advised HF clinic attempted to call him 12/7 and to return the call today regarding making a follow up appointment since he had a device shock.  He will be out of town starting 12/18 and be back on 01/15/20.      Optivol thoracic impedance returned to normal after taking Metolazone 2.5mg  x1 with 40 mEq KCl on 12/1 as prescribed by Dr Gala Romney in office visit.  to reset fluid; may take weekly if needed.   Prescribed:   Torsemide 40 mg take 1 tablet daily.  Potassium 20 mEq take 1 tablet daily.  Take 40 mEq with Metolazone.  Spironolactone 25 mg take 1 tablet daily  Metolazone 2.5 mg take 1 tablet weekly if needed (Per Dr Elijah Birk 12/14/19 OV note)  Labs: 12/14/2019 Creatinine 1.57, BUN 18, Potassium 3.5, Sodium 141 11/25/2019 Creatinine 1.31, BUN 20, Potassium 3.7, Sodium 139  09/13/2019 Creatinine 1.39, BUN 10, Potassium 4.0, Sodium 136, GFR >60  A complete set of results can be found in Results Review.  Recommendations: Discussed limiting salt intake during the holidays.  Encouraged to call if experiencing fluid symptoms.  Message sent to device clinic to review 12/3 [facility] report for device shock.  Follow-up plan: ICM clinic phone appointment on 12/28/2019 to recheck fluid levels.    91 day device clinic remote transmission 01/27/2020.    EP/Cardiology Office Visits: 01/05/2020 Advanced HF clinic.    Copy of ICM check sent to Dr. Sammie Bench and Dr Gala Romney as Lorain Childes the effectiveness of Metolazone and device shock.   3 month ICM trend: 12/21/2019    1 Year ICM trend:       Karie Soda, RN 12/21/2019 9:31 AM

## 2019-12-22 ENCOUNTER — Encounter: Payer: Self-pay | Admitting: Family Medicine

## 2019-12-22 ENCOUNTER — Other Ambulatory Visit: Payer: Self-pay

## 2019-12-22 ENCOUNTER — Ambulatory Visit: Payer: BC Managed Care – PPO | Admitting: Family Medicine

## 2019-12-22 VITALS — BP 117/71 | HR 78 | Temp 99.2°F | Wt 345.0 lb

## 2019-12-22 DIAGNOSIS — S01112A Laceration without foreign body of left eyelid and periocular area, initial encounter: Secondary | ICD-10-CM

## 2019-12-22 DIAGNOSIS — S0101XA Laceration without foreign body of scalp, initial encounter: Secondary | ICD-10-CM

## 2019-12-22 DIAGNOSIS — E878 Other disorders of electrolyte and fluid balance, not elsewhere classified: Secondary | ICD-10-CM | POA: Diagnosis not present

## 2019-12-22 NOTE — Progress Notes (Signed)
   Subjective:    Patient ID: Roberto [name], male    DOB: 15-Jul-1965, 54 y.o.   MRN: [id number]  HPI He is here for a follow-up visit after a recent fall while in the [facility] on December 3.  He was seen in the emergency room.  His potassium and magnesium were low.  He stopped taking his diuretic as well as Entresto at their request.  Several sutures were applied to the left eyebrow and to the occipital parietal area.  That area had staples applied.  Presently he has no weakness, numbness, blurred or double vision.   Review of Systems     Objective:   Physical Exam Alert and in no distress.  The wound in the occipital area  is healing nicely.  The left eyebrow and eye itself are slightly ecchymotic. Review of his record indicates his magnesium level is now 1.8.  Potassium 3.7.      Assessment & Plan:  Disorder of electrolytes  Eyebrow laceration, left, initial encounter  Laceration of occipital scalp, initial encounter He has an appointment pending with Dr. Gala Romney.  He will hold his 2 medications until discussing this with Dr. Gala Romney. The sutures removed from the left eyebrow.  Cautioned that there could still be 1 or 2 there since the sutures were black and there is still blood in the area.  The staples were removed from the occipital area without difficulty.

## 2019-12-23 ENCOUNTER — Encounter (HOSPITAL_COMMUNITY): Payer: Self-pay

## 2019-12-27 NOTE — Telephone Encounter (Signed)
Patient scheduled for wed 12/15

## 2019-12-28 ENCOUNTER — Encounter (HOSPITAL_COMMUNITY): Payer: Self-pay | Admitting: Internal Medicine

## 2019-12-28 ENCOUNTER — Ambulatory Visit (HOSPITAL_COMMUNITY)
Admission: RE | Admit: 2019-12-28 | Discharge: 2019-12-28 | Disposition: A | Discharge status: Home / Self Care | Payer: BC Managed Care – PPO | Source: Ambulatory Visit | Attending: Internal Medicine | Admitting: Internal Medicine

## 2019-12-28 ENCOUNTER — Ambulatory Visit: Payer: BC Managed Care – PPO | Attending: Family

## 2019-12-28 ENCOUNTER — Other Ambulatory Visit: Payer: Self-pay

## 2019-12-28 ENCOUNTER — Ambulatory Visit (INDEPENDENT_AMBULATORY_CARE_PROVIDER_SITE_OTHER): Payer: BC Managed Care – PPO

## 2019-12-28 VITALS — BP 104/60 | HR 69 | Wt 351.8 lb

## 2019-12-28 DIAGNOSIS — Z8719 Personal history of other diseases of the digestive system: Secondary | ICD-10-CM | POA: Insufficient documentation

## 2019-12-28 DIAGNOSIS — Z9049 Acquired absence of other specified parts of digestive tract: Secondary | ICD-10-CM | POA: Diagnosis not present

## 2019-12-28 DIAGNOSIS — I5022 Chronic systolic (congestive) heart failure: Secondary | ICD-10-CM

## 2019-12-28 DIAGNOSIS — Z79899 Other long term (current) drug therapy: Secondary | ICD-10-CM | POA: Diagnosis not present

## 2019-12-28 DIAGNOSIS — I4819 Other persistent atrial fibrillation: Secondary | ICD-10-CM | POA: Diagnosis not present

## 2019-12-28 DIAGNOSIS — Z86711 Personal history of pulmonary embolism: Secondary | ICD-10-CM | POA: Diagnosis not present

## 2019-12-28 DIAGNOSIS — I472 Ventricular tachycardia, unspecified: Secondary | ICD-10-CM

## 2019-12-28 DIAGNOSIS — Z95810 Presence of automatic (implantable) cardiac defibrillator: Secondary | ICD-10-CM | POA: Insufficient documentation

## 2019-12-28 DIAGNOSIS — Z86718 Personal history of other venous thrombosis and embolism: Secondary | ICD-10-CM | POA: Diagnosis not present

## 2019-12-28 DIAGNOSIS — Z23 Encounter for immunization: Secondary | ICD-10-CM

## 2019-12-28 DIAGNOSIS — Z6841 Body Mass Index (BMI) 40.0 and over, adult: Secondary | ICD-10-CM | POA: Diagnosis not present

## 2019-12-28 DIAGNOSIS — I872 Venous insufficiency (chronic) (peripheral): Secondary | ICD-10-CM | POA: Diagnosis not present

## 2019-12-28 DIAGNOSIS — I480 Paroxysmal atrial fibrillation: Secondary | ICD-10-CM | POA: Diagnosis not present

## 2019-12-28 DIAGNOSIS — Z7901 Long term (current) use of anticoagulants: Secondary | ICD-10-CM | POA: Diagnosis not present

## 2019-12-28 DIAGNOSIS — Z01810 Encounter for preprocedural cardiovascular examination: Secondary | ICD-10-CM

## 2019-12-28 DIAGNOSIS — Z932 Ileostomy status: Secondary | ICD-10-CM | POA: Insufficient documentation

## 2019-12-28 DIAGNOSIS — I513 Intracardiac thrombosis, not elsewhere classified: Secondary | ICD-10-CM | POA: Insufficient documentation

## 2019-12-28 LAB — COMPREHENSIVE METABOLIC PANEL
ALT: 13 U/L (ref 0–44)
AST: 37 U/L (ref 15–41)
Albumin: 2.4 g/dL — ABNORMAL LOW (ref 3.5–5.0)
Alkaline Phosphatase: 39 U/L (ref 38–126)
Anion gap: 9 (ref 5–15)
BUN: 17 mg/dL (ref 6–20)
CO2: 26 mmol/L (ref 22–32)
Calcium: 8.5 mg/dL — ABNORMAL LOW (ref 8.9–10.3)
Chloride: 103 mmol/L (ref 98–111)
Creatinine, Ser: 1.48 mg/dL — ABNORMAL HIGH (ref 0.61–1.24)
GFR, Estimated: 56 mL/min — ABNORMAL LOW (ref >60)
Glucose, Bld: 139 mg/dL — ABNORMAL HIGH (ref 70–99)
Potassium: 4.9 mmol/L (ref 3.5–5.1)
Sodium: 138 mmol/L (ref 135–145)
Total Bilirubin: 1.2 mg/dL (ref 0.3–1.2)
Total Protein: 6.2 g/dL — ABNORMAL LOW (ref 6.5–8.1)

## 2019-12-28 LAB — MAGNESIUM: Magnesium: 2.1 mg/dL (ref 1.7–2.4)

## 2019-12-28 NOTE — Patient Instructions (Signed)
TAKE 40 meq of Potassium with every dose of Metolazone.  Routine lab work today. Will notify you of abnormal results   Follow up in 2 months.

## 2019-12-28 NOTE — Progress Notes (Signed)
Advanced Heart Failure Clinic Note   Patient ID: Roberto [name], male   DOB: 1965/04/11, 54 y.o.   MRN: [id number] PCP: Dr Jacqulyn Bath Cardiologist: Dr [name] EP: Dr Johney Frame AHF: Dr. Gala Romney   HPI: Roberto [name] is a 54 y.o. 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   Admitted to [facility] 12/16/11 for recurrent HF. Cath with no CAD.   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 EF dropped from 40%-> 15%. Underwent successful DCCV. Started on amiodarone.  S/p AFL ablation 11/17/2016  Pt underwent outpatient DCCV 03/20/17, then came back that evening with worsening SOB and CP. Diuresed with IV lasix. Also treated for cellulitis of his RLE, and gout. Discharge weight 280 lbs.  Echo 7/20: EF 25-30%   Admitted 12/19/18- 01/24/19 for acute diverticulits. Failed IV abx. Underwent R colectomy with ileostomy.  In OR patient became hypotensive, required pressors. Course c/b perforated R colon with right lateral peritoneal fluid collection s/p right lateral abdominal drain placement in IR 01/04/2019. Post-op struggling with non-healing ab wound and  underwent skin grafting on 09/05/19.  We saw him on 12/1 and was doing well but was volume overloaded. We gave him metolazone, He took 2 doses instead of 1 and developed hypokalemia and had syncope with head trauma and ICD shock. ICD interrogation showed VT. He has had K and mag checked subsequently by [facility] and both ok. Fluid is a "whole lot better". Has been working out more with cardio. No CP or undue SOB.    Cardiac studies:  Echo 12/21: EF 20-25%, RV ok, personally reviewed  Echo 07/2017 EF appears slightly improved to 20-25%. Possible clot (vs artifact in IVC/RA junction) Personally reviewed  Echo 10/15/16 - LVEF 15%  TEE 03/20/17 LVEF 15-20%, Severe reduced RV function  RHC 10/20/16  RA = 3 RV = 45/7 PA = 49/20  (31) PCW = 28 Fick cardiac output/index = 5.0/2.1 PVR = 0.5 WU Ao sat = 95% PA sat = 64%, 61%  12/17/11 ECHO 25-30%   05/20/2012 ECHO EF 40-45%  CARDIAC MRI - 12/18/11  1. Moderately dilated left ventricle with moderately decreased systolic function, EF 38%. Global hypokinesis.  2. Mildly dilated right ventricle with mild to moderately decreased systolic function.  3. No definite LV thrombus noted.  4. Patchy non-subendocardial delayed enhancement seen in the ventricular septum (see above for description). This is not suggestive of a coronary disease pattern. This could be suggestive of infiltrative disease versus prior myocarditis.  RHC/LHC 12/22/11  Cors: Normal  SH: Lives with his wife and 5 children. Works full time running a Day[employer]  Review of systems complete and found to be negative unless listed in HPI.    Past Medical History:  Diagnosis Date  . AICD (automatic cardioverter/defibrillator) present 08/2018  . Arthritis   . Chronic combined systolic and diastolic CHF (congestive heart failure) (HCC)   . Coronary artery disease   . Deep vein thrombosis (HCC)   . Diverticulitis   . Hepatitis   . Lupus anticoagulant positive   . Morbid obesity (HCC)   . Morbid obesity with BMI of 50.0-59.9, adult (HCC)   . Mural thrombus of heart    coumadin  . Nonischemic cardiomyopathy (HCC)    a) 12/17/11 echo: LVEF 25-30%, grade 3 diastolic dysfunction (c/w restriction), mod MR, mod LA/LV and mild RA dilatation; b) 12/18/11 cMRI: LVEF  38%, mod LV/mild RV dilatation, global HK, mild-mod RV sys dysfxn, no LV thrombus & patchy non-subendocardial delayed enhancement c/w infil dz or prior myocarditis; c. 07/2018 EF 25-30%.  . Paroxysmal SVT (supraventricular tachycardia) (HCC)   . Pneumonia   . Pulmonary embolism (HCC)    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  . Wears glasses   . Wound of abdomen     Current Outpatient Medications  Medication Sig Dispense Refill  . acetaminophen (TYLENOL)  500 MG tablet Take 1 tablet (500 mg total) by mouth every 6 (six) hours as needed. For use AFTER surgery 30 tablet 0  . amiodarone (PACERONE) 200 MG tablet Take 1 tablet (200 mg total) by mouth daily. 30 tablet 5  . apixaban (ELIQUIS) 5 MG TABS tablet Take 1 tablet (5 mg total) by mouth 2 (two) times daily. 60 tablet 5  . carvedilol (COREG) 3.125 MG tablet TAKE 1 TABLET(3.125 MG) BY MOUTH TWICE DAILY WITH A MEAL 60 tablet 3  . coconut oil OIL Apply 1 application topically daily as needed (dry skin).     [name] digoxin (LANOXIN) 0.125 MG tablet Take 1 tablet (0.125 mg total) by mouth daily. 30 tablet 3  . HYDROcodone-acetaminophen (NORCO/VICODIN) 5-325 MG tablet Take by mouth.    [name] MAGNESIUM-OXIDE 400 (241.3 Mg) MG tablet Take 1 tablet by mouth 2 (two) times daily.    . metolazone (ZAROXOLYN) 2.5 MG tablet Take 1 tablet (2.5 mg total) by mouth daily. (Patient not taking: Reported on 12/22/2019) 10 tablet 0  . potassium chloride SA (KLOR-CON) 20 MEQ tablet Take 1 tablet (20 mEq total) by mouth daily. Take 40 meq of potassium when you take  Metolazone 30 tablet 5  . sacubitril-valsartan (ENTRESTO) 49-51 MG Take 1 tablet by mouth 2 (two) times daily. (Patient not taking: Reported on 12/22/2019) 60 tablet 4  . spironolactone (ALDACTONE) 25 MG tablet TAKE 1 TABLET(25 MG) BY MOUTH DAILY 90 tablet 0  . torsemide (DEMADEX) 20 MG tablet Take 1 tablet (20 mg total) by mouth 2 (two) times daily. Take an extra tablet as needed 75 tablet 5   No current facility-administered medications for this encounter.   PHYSICAL EXAM: Vitals:   12/28/19 1053  BP: 104/60  Pulse: 69  SpO2: 97%  Weight: (!) 159.6 kg (351 lb 12.8 oz)   Wt Readings from Last 3 Encounters:  12/22/19 (!) 156.5 kg (345 lb)  12/14/19 (!) 157.7 kg (347 lb 9.6 oz)  12/01/19 (!) 158.8 kg (350 lb)    PHYSICAL EXAM: General:  Well appearing. Obese No resp difficulty HEENT: normal stitches over left eyebrow Neck: supple. no JVD. Carotids 2+ bilat;  no bruits. No lymphadenopathy or thryomegaly appreciated. Cor: PMI nondisplaced. Regular rate & rhythm. No rubs, gallops or murmurs. Lungs: clear Abdomen: obese soft, nontender, nondistended. No hepatosplenomegaly. No bruits or masses. Good bowel sounds. Extremities: no cyanosis, clubbing, rash, edema Neuro: alert & orientedx3, cranial nerves grossly intact. moves all 4 extremities w/o difficulty. Affect pleasant   ICD interrogation: ICD shock for VT/VF as above. Fluid back down. Personally reviewed  ASSESSMENT & PLAN:  1. VT - on 12/3 in setting of hypokalemia related to metolazone use - limit metoalzone use. Take K 40 with each dose - labs today - no driving for 6 months  2. Chronic Systolic Heart Failure: - Echo 10/15/16 - LVEF 15% - TEE 03/2017 LVEF 15-20% - Echo 07/2017 with LVEF 20-25% - Echo 7/20 EF 25-30% - Echo 12/21 EF 25-30% -  Stable NYHA II - Volume status ok now  - Continue torsemide 40 mg daily. Can still take metolazone as needed but reminded to limit dosing and take k 40 with each dose - Continue spiro 25 mg daily - Continue digoxin 0.125 mg daily. Check dig level  - Continue carvedilol 3.125 bid - Continue Entresto to 49/51mg  bid - Would add [place] after ileostomy reversal in Jan 2022 - ICD checked in clinic today as reported above - Labs today  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 - Maintaining NSR. - Currently on amiodarone 200 mg daily and Eliquis 5 bid; no bleeding issues - No change  4. H/O Acute diverticulitis with colon resection and ileostomy - Pre-op CV clearance - non-healing wound. S/p skin grafting 09/05/19 - abdominal wound healed, plans for reversal of ileostomy in January 2022 - ok to proceed from our standpoint    Arvilla Meres, MD  8:10 AM

## 2019-12-30 ENCOUNTER — Telehealth: Payer: Self-pay

## 2019-12-30 NOTE — Telephone Encounter (Signed)
Remote ICM transmission received.  Attempted call to patient regarding ICM remote transmission and left message to return call   

## 2019-12-30 NOTE — Progress Notes (Signed)
EPIC Encounter for ICM Monitoring  Patient Name: Roberto [name] is a 54 y.o. male Date: 12/30/2019 Primary Care Physican: [name], MD Primary Cardiologist: Bensimhon Electrophysiologist: Elberta Fortis 12/21/2019 Weight: 338.6 lbs   VT-NS (>4 beats, >200 bpm)    1 Time in AF       <0.1 hr/day (0.1%)                                                           Attempted call to patient and unable to reach.  Left message to return call. Transmission reviewed.   Optivol thoracic impedance trending below baseline normal and was viewed by Dr Gala Romney in the office on 12/28/2019   Prescribed:   Torsemide 20 mg Take 1 tablet (20 mg total) by mouth 2 (two) times daily. Take an extra tablet as needed  Potassium 20 mEq take 1 tablet daily.  Take 40 mEq with Metolazone.  Spironolactone 25 mg take 1 tablet daily  Metolazone 2.5 mg take 1 tablet weekly if needed (Per Dr Elijah Birk 12/14/19 OV note)  Labs: 12/14/2019 Creatinine 1.57, BUN 18, Potassium 3.5, Sodium 141 11/25/2019 Creatinine 1.31, BUN 20, Potassium 3.7, Sodium 139  09/13/2019 Creatinine 1.39, BUN 10, Potassium 4.0, Sodium 136, GFR >60  A complete set of results can be found in Results Review.  Recommendations:  Unable to reach.    Follow-up plan: ICM clinic phone appointment on 01/23/2020.   91 day device clinic remote transmission 01/27/2020.    EP/Cardiology Office Visits:  02/28/2020 Advanced HF clinic.    Copy of ICM check sent to Dr. Sammie Bench.    3 month ICM trend: 12/28/2019    1 Year ICM trend:       Karie Soda, RN 12/30/2019 2:43 PM

## 2020-01-05 ENCOUNTER — Encounter (HOSPITAL_COMMUNITY): Payer: BC Managed Care – PPO

## 2020-01-12 ENCOUNTER — Other Ambulatory Visit (HOSPITAL_COMMUNITY): Payer: BC Managed Care – PPO

## 2020-01-16 ENCOUNTER — Other Ambulatory Visit (INDEPENDENT_AMBULATORY_CARE_PROVIDER_SITE_OTHER): Payer: BC Managed Care – PPO

## 2020-01-16 ENCOUNTER — Other Ambulatory Visit: Payer: Self-pay

## 2020-01-16 DIAGNOSIS — Z939 Artificial opening status, unspecified: Secondary | ICD-10-CM

## 2020-01-16 DIAGNOSIS — Z8719 Personal history of other diseases of the digestive system: Secondary | ICD-10-CM | POA: Diagnosis not present

## 2020-01-16 DIAGNOSIS — Z9049 Acquired absence of other specified parts of digestive tract: Secondary | ICD-10-CM | POA: Diagnosis not present

## 2020-01-16 DIAGNOSIS — Z86010 Personal history of colonic polyps: Secondary | ICD-10-CM | POA: Diagnosis not present

## 2020-01-16 LAB — BASIC METABOLIC PANEL
BUN: 23 mg/dL (ref 6–23)
CO2: 25 mEq/L (ref 19–32)
Calcium: 8.3 mg/dL — ABNORMAL LOW (ref 8.4–10.5)
Chloride: 104 mEq/L (ref 96–112)
Creatinine, Ser: 1.64 mg/dL — ABNORMAL HIGH (ref 0.40–1.50)
GFR: 47.16 mL/min — ABNORMAL LOW (ref >60.00)
Glucose, Bld: 187 mg/dL — ABNORMAL HIGH (ref 70–99)
Potassium: 4 mEq/L (ref 3.5–5.1)
Sodium: 137 mEq/L (ref 135–145)

## 2020-01-16 NOTE — Progress Notes (Signed)
BMP entered for CT scan.

## 2020-01-17 ENCOUNTER — Telehealth: Payer: Self-pay | Admitting: Gastroenterology

## 2020-01-17 NOTE — Telephone Encounter (Signed)
Pls call pt to clarify his instruction for upcoming procedure at [facility]. I told pt that he should be following instructions for magnesium citrate. However, he states that he received a call indicating that he was going to be given something different.

## 2020-01-17 NOTE — Telephone Encounter (Signed)
Pt informed to follow Flex Sig instructions. I have given pt information over the phone as well as sent another mychart message with instructions. Pt voiced understanding.       NAME: Roberto [name]                    DOB: Jul 11, 1965 MRN: [id number] Procedure Date: 01/23/2020 Arrival Time: 6:00am Procedure Time: 7:30am   Location of Procedure: [facility] Entrance A   YOU MAY BE RECEIVING UP TO 4 PHONE CALLS FROM THE HOSPITAL TO EXPEDITE THE ADMITTING PROCESS- IF YOU MISS ONE, PLEASE CALL BACK AS SOON AS POSSIBLE  Due to recent COVID-19 restrictions implemented by our local and state authorities and in an effort to keep both patients and staff as safe as possible, our hospital system now requires COVID-19 testing prior to any scheduled hospital procedure. Please go to [address] [place], [place], [place] [address] on 01/19/20 at 1:10pm. This is a drive up testing site, you will not need to exit your vehicle.  You will not be billed at the time of testing but may receive a bill later depending on your insurance. The approximate cost of the test is $100. You must agree to quarantine from the time of your testing until the procedure date on 01/23/20 . This should include staying at home with ONLY the people you live with. Avoid take-out, grocery store shopping or leaving the house for any non-emergent reason. Failure to have your COVID-19 test done on the date and time you have been scheduled will result in cancellation of procedure. Please call our office at [phone] if you have any questions.   ______________________________________________________________________  PREPARATION FOR FLEXIBLE SIGMOIDOSCOPY WITH MAGNESIUM CITRATE  Prior to the day before your procedure, purchase one 8 oz. bottle of Magnesium Citrate and one Fleet Enema from the laxative section of your drugstore.   THE DAY BEFORE YOUR PROCEDURE:     DATE: 01/22/20  DAY: Sunday  1. Have a clear liquid dinner the night  before your procedure.  2. Do not drink anything colored red or purple. Avoid juices with pulp. No orange juice.  Clear liquids include:NO RED NO PURPLE   Water Jello  Ice Popsicles  Tea (sugar ok, no milk/cream) Powdered fruit flavored drinks  Coffee (sugar ok, no milk/cream) Gatorade  Juice: apple, white grape, white cranberry Lemonade  Clear bullion, consomme, broth Carbonated beverages (any kind)  Strained chicken noodle soup  (no noodles or chicken) Hard Candy   3. At 7:00 pm the night before your procedure, drink one bottle of Magnesium Citrate over ice.  4. Then, drink at least 3 additional glasses of clear liquids before bedtime (preferably juices).  5. Results are expected usually within 1 to 6 hours after taking the Magnesium Citrate.  __________________________________________________________________________  THE DAY OF YOUR PROCEDURE:   DATE: 01/23/20 DAY: Monday  No solid foods. Remain on clear liquids only up until 4 hours before the procedure.   Clear liquids include:NO RED NO PURPLE   Water Jello  Ice Popsicles  Tea (sugar ok, no milk/cream) Powdered fruit flavored drinks  Coffee (sugar ok, no milk/cream) Gatorade  Juice: apple, white grape, white cranberry Lemonade  Clear bullion, consomme, broth Carbonated beverages (any kind)  Strained chicken noodle soup  (no noodles or chicken) Hard Candy    1. Use one Fleet Enema one hour prior to coming for procedure starting at 5:00am  2. You may drink clear liquids until 3:30am, which is 4  hours before your procedure.  ______________________________________________________________________  MEDICATION INSTRUCTIONS  You will be contacted by the hospital endoscopy staff prior to your procedure with instructions on what medications to take prior to your procedure.   You should NOT stop any blood thinners, including Aspirin , unless instructed to do so by your gastroenterologist.   Take allowed  medicines as directed by the Hospital  Diabetic patients - You will be contacted by the hospital endoscopy staff prior to your procedure with instructions on what medications to take prior to your procedure.   HOLD YOUR ELIQUIS FOR 1 DAY PRIOR TO PROCEDURE.   If you use any type of inhaler please bring them with you to your procedure.   Call office [phone] if you have fever 2 days before procedure

## 2020-01-19 ENCOUNTER — Other Ambulatory Visit: Payer: Self-pay

## 2020-01-19 ENCOUNTER — Other Ambulatory Visit (HOSPITAL_COMMUNITY)
Admission: RE | Admit: 2020-01-19 | Discharge: 2020-01-19 | Disposition: A | Discharge status: Home / Self Care | Payer: BC Managed Care – PPO | Source: Ambulatory Visit | Attending: Gastroenterology | Admitting: Gastroenterology

## 2020-01-19 ENCOUNTER — Ambulatory Visit (INDEPENDENT_AMBULATORY_CARE_PROVIDER_SITE_OTHER)
Admission: RE | Admit: 2020-01-19 | Discharge: 2020-01-19 | Disposition: A | Discharge status: Home / Self Care | Payer: BC Managed Care – PPO | Source: Ambulatory Visit | Attending: Gastroenterology | Admitting: Gastroenterology

## 2020-01-19 DIAGNOSIS — Z939 Artificial opening status, unspecified: Secondary | ICD-10-CM

## 2020-01-19 DIAGNOSIS — R933 Abnormal findings on diagnostic imaging of other parts of digestive tract: Secondary | ICD-10-CM | POA: Diagnosis not present

## 2020-01-19 DIAGNOSIS — Z86010 Personal history of colonic polyps: Secondary | ICD-10-CM

## 2020-01-19 DIAGNOSIS — Z01812 Encounter for preprocedural laboratory examination: Secondary | ICD-10-CM | POA: Insufficient documentation

## 2020-01-19 DIAGNOSIS — Z8719 Personal history of other diseases of the digestive system: Secondary | ICD-10-CM

## 2020-01-19 DIAGNOSIS — Z20822 Contact with and (suspected) exposure to covid-19: Secondary | ICD-10-CM | POA: Insufficient documentation

## 2020-01-19 DIAGNOSIS — Z9049 Acquired absence of other specified parts of digestive tract: Secondary | ICD-10-CM

## 2020-01-19 MED ORDER — IOHEXOL 300 MG/ML  SOLN
100.0000 mL | Freq: Once | INTRAMUSCULAR | Status: AC | PRN
  Administered 2020-01-19: 100 mL via INTRAVENOUS

## 2020-01-20 ENCOUNTER — Other Ambulatory Visit: Payer: Self-pay

## 2020-01-20 ENCOUNTER — Encounter (HOSPITAL_COMMUNITY): Payer: Self-pay | Admitting: Gastroenterology

## 2020-01-20 LAB — SARS CORONAVIRUS 2 (TAT 6-24 HRS): SARS Coronavirus 2: NEGATIVE

## 2020-01-20 NOTE — Progress Notes (Signed)
Patient denies shortness of breath, fever, cough or chest pain.  PCP - Dr Susann Givens Cardiologist - Dr Gala Romney Electrophysiology - Dr Elberta Fortis  Chest x-ray - 01/19/19 (1V) EKG - 11/25/19 Stress Test - >10 yrs ago ECHO - 12/14/19 Cardiac Cath - 12/22/11  Medtronic ICD Pacemaker - Yes, last remote check was on 10/28/19.  Sleep Study -  Greater 10 yrs ago - negative per patient. CPAP - none  Blood Thinner Instructions:  Hold eliquis 1 day prior to procedure per MD.  Last dose will be on 01/21/20.  Anesthesia review: Yes  STOP now taking any Aspirin (unless otherwise instructed by your surgeon), Aleve, Naproxen, Ibuprofen, Motrin, Advil, Goody's, BC's, all herbal medications, fish oil, and all vitamins.   Coronavirus Screening Covid test on 01/19/20 was negative.  Patient verbalized understanding of instructions that were given via phone.

## 2020-01-20 NOTE — Progress Notes (Signed)
Anesthesia Chart Review: Same day workup  Follows with cardiology for chronic combined systolic and diastolic CHF, nonischemic cardiomyopathy (diagnosed 12/2005; normal coronaries 2008 & 2013; s/p Medtronic Visia AF MRI SureScan ICD 09/03/18), PSVT (s/p radiofrequency ablation of SVT 11/17/16), afib/flutter (s/p DCCV 10/17/16, 03/20/17), lupus anticoagulant positive, LV mural thrombus (2008), LLE DVT/PE (12/2005 post knee surgery).  Last seen by Dr. Gala Romney 12/28/19. Per note, "We saw him on 12/1 and was doing well but was volume overloaded. We gave him metolazone, He took 2 doses instead of 1 and developed hypokalemia and had syncope with head trauma and ICD shock. ICD interrogation showed VT. He has had K and mag checked subsequently by [facility] and both ok. Fluid is a "whole lot better". Has been working out more with cardio. No CP or undue SOB." He was also cleared for upcoming abdominal surgery, no states, "H/O Acute diverticulitis with colon resection and ileostomy - Pre-op CV clearance - non-healing wound. S/p skin grafting 09/05/19 - abdominal wound healed, plans for reversal of ileostomy in January 2022- ok to proceed from our standpoint."  Pt reports LD Eliquis 01/21/20.  Will need DOS labs and eval.   EKG 11/25/19: Sinus rhythm. Rate 75. Multiform ventricular premature complexes. Borderline prolonged PR interval. Consider left atrial enlargement. Nonspecific IVCD with LAD. Anteroseptal infarct, old. No significant change since 03/31/2019  Echo 12/14/19: 1. Left ventricular ejection fraction, by estimation, is 25 to 30%. The  left ventricle has severely decreased function. Left ventricular  endocardial border not optimally defined to evaluate regional wall motion.  The left ventricular internal cavity size  was moderately dilated. There is mild left ventricular hypertrophy. Left  ventricular diastolic parameters are consistent with Grade III diastolic  dysfunction (restrictive).  2.  Right ventricular systolic function is normal. The right ventricular  size is normal.  3. The mitral valve is normal in structure. No evidence of mitral valve  regurgitation. No evidence of mitral stenosis.  4. The aortic valve is normal in structure. Aortic valve regurgitation is  not visualized. No aortic stenosis is present.  5. The inferior vena cava is normal in size with greater than 50%  respiratory variability, suggesting right atrial pressure of 3 mmHg.   Comparison(s): No significant change from prior study. Prior images  reviewed side by side.   RHC 10/20/16: Findings: RA = 3 RV = 45/7 PA = 49/20 (31) PCW = 28 Fick cardiac output/index = 5.0/2.1 PVR = 0.5 WU Ao sat = 95% PA sat = 64%, 61% Assessment: 1. Mild to moderate volume overload with mildly depressed CO  RHC/LHC 12/22/11: Assessment: 1. Normal coronaries 2. Well compensated hemodynamics 3. Severe LV dysfunction by echo  MRI Cardiac 12/18/11: IMPRESSION:  1. Moderately dilated left ventricle with moderately decreased  systolic function, EF 38%. Global hypokinesis.  2. Mildly dilated right ventricle with mild to moderately  decreased systolic function.  3. No definite LV thrombus noted.  4. Patchy non-subendocardial delayed enhancement seen in the  ventricular septum (see above for description). This is not  suggestive of a coronary disease pattern. This could be suggestive  of infiltrative disease versus prior myocarditis.    Roberto [name] [facility]/Anesthesiology Phone [phone] 01/20/2020 3:15 PM

## 2020-01-20 NOTE — Anesthesia Preprocedure Evaluation (Addendum)
Anesthesia Evaluation  Patient identified by MRN, date of birth, ID band Patient awake    Reviewed: Allergy & Precautions, H&P , NPO status , Patient's Chart, lab work & pertinent test results  Airway Mallampati: II   Neck ROM: full    Dental   Pulmonary neg pulmonary ROS,    breath sounds clear to auscultation       Cardiovascular +CHF  + Cardiac Defibrillator  Rhythm:regular Rate:Normal  NICM. EF 25-30%.   Neuro/Psych    GI/Hepatic (+) Hepatitis -  Endo/Other  Morbid obesity  Renal/GU Renal InsufficiencyRenal disease     Musculoskeletal  (+) Arthritis ,   Abdominal   Peds  Hematology   Anesthesia Other Findings   Reproductive/Obstetrics                             Anesthesia Physical Anesthesia Plan  ASA: III  Anesthesia Plan: MAC   Post-op Pain Management:    Induction: Intravenous  PONV Risk Score and Plan: 1 and Propofol infusion and Treatment may vary due to age or medical condition  Airway Management Planned: Simple Face Mask  Additional Equipment:   Intra-op Plan:   Post-operative Plan:   Informed Consent: I have reviewed the patients History and Physical, chart, labs and discussed the procedure including the risks, benefits and alternatives for the proposed anesthesia with the patient or authorized representative who has indicated his/her understanding and acceptance.       Plan Discussed with: CRNA, Anesthesiologist and Surgeon  Anesthesia Plan Comments: (PAT note by Karoline Caldwell, PA-C: Follows with cardiology for chronic combined systolic and diastolic CHF, nonischemic cardiomyopathy (diagnosed 12/2005; normal coronaries 2008 & 2013; s/p Medtronic Visia AF MRI SureScan ICD 09/03/18), PSVT (s/p radiofrequency ablation of SVT 11/17/16), afib/flutter (s/p DCCV 10/17/16, 03/20/17), lupus anticoagulant positive, LV mural thrombus (2008), LLE DVT/PE (12/2005 post knee  surgery).  Last seen by Dr. Haroldine Laws 12/28/19. Per note, "We saw him on 12/1 and was doing well but was volume overloaded. We gave him metolazone, He took 2 doses instead of 1 and developed hypokalemia and had syncope with head trauma and ICD shock. ICD interrogation showed VT. He has had K and mag checked subsequently by [facility] and both ok. Fluid is a "whole lot better". Has been working out more with cardio. No CP or undue SOB." He was also cleared for upcoming abdominal surgery, no states, "H/O Acute diverticulitis with colon resection and ileostomy - Pre-op CV clearance - non-healing wound. S/p skin grafting 09/05/19 - abdominal wound healed, plans for reversal of ileostomy in January 2022- ok to proceed from our standpoint."  Pt reports LD Eliquis 01/21/20.  Will need DOS labs and eval.   EKG 11/25/19: Sinus rhythm. Rate 75. Multiform ventricular premature complexes. Borderline prolonged PR interval. Consider left atrial enlargement. Nonspecific IVCD with LAD. Anteroseptal infarct, old. No significant change since 03/31/2019  Echo 12/14/19: 1. Left ventricular ejection fraction, by estimation, is 25 to 30%. The  left ventricle has severely decreased function. Left ventricular  endocardial border not optimally defined to evaluate regional wall motion.  The left ventricular internal cavity size  was moderately dilated. There is mild left ventricular hypertrophy. Left  ventricular diastolic parameters are consistent with Grade III diastolic  dysfunction (restrictive).  2. Right ventricular systolic function is normal. The right ventricular  size is normal.  3. The mitral valve is normal in structure. No evidence of mitral valve  regurgitation. No  evidence of mitral stenosis.  4. The aortic valve is normal in structure. Aortic valve regurgitation is  not visualized. No aortic stenosis is present.  5. The inferior vena cava is normal in size with greater than 50%  respiratory  variability, suggesting right atrial pressure of 3 mmHg.   Comparison(s): No significant change from prior study. Prior images  reviewed side by side.   [place] 10/20/16: Findings: RA = 3 RV = 45/7 PA = 49/20 (31) PCW = 28 Fick cardiac output/index = 5.0/2.1 PVR = 0.5 WU Ao sat = 95% PA sat = 64%, 61% Assessment: 1. Mild to moderate volume overload with mildly depressed CO  RHC/LHC 12/22/11: Assessment: 1. Normal coronaries 2. Well compensated hemodynamics 3. Severe LV dysfunction by echo  MRI Cardiac 12/18/11: IMPRESSION:  1. Moderately dilated left ventricle with moderately decreased  systolic function, EF 17%. Global hypokinesis.  2. Mildly dilated right ventricle with mild to moderately  decreased systolic function.  3. No definite LV thrombus noted.  4. Patchy non-subendocardial delayed enhancement seen in the  ventricular septum (see above for description). This is not  suggestive of a coronary disease pattern. This could be suggestive  of infiltrative disease versus prior myocarditis.   )       Anesthesia Quick Evaluation

## 2020-01-23 ENCOUNTER — Ambulatory Visit (HOSPITAL_COMMUNITY)
Admission: EM | Admit: 2020-01-23 | Discharge: 2020-01-23 | Disposition: A | Discharge status: Home / Self Care | Payer: BC Managed Care – PPO | Attending: Gastroenterology | Admitting: Gastroenterology

## 2020-01-23 ENCOUNTER — Encounter (HOSPITAL_COMMUNITY)
Admission: EM | Disposition: A | Discharge status: Home / Self Care | Payer: Self-pay | Source: Home / Self Care | Attending: Gastroenterology

## 2020-01-23 ENCOUNTER — Ambulatory Visit (HOSPITAL_COMMUNITY): Payer: BC Managed Care – PPO | Admitting: Physician Assistant

## 2020-01-23 ENCOUNTER — Encounter (HOSPITAL_COMMUNITY): Payer: Self-pay | Admitting: Gastroenterology

## 2020-01-23 ENCOUNTER — Other Ambulatory Visit: Payer: Self-pay

## 2020-01-23 ENCOUNTER — Ambulatory Visit (INDEPENDENT_AMBULATORY_CARE_PROVIDER_SITE_OTHER): Payer: BC Managed Care – PPO

## 2020-01-23 DIAGNOSIS — Z980 Intestinal bypass and anastomosis status: Secondary | ICD-10-CM | POA: Insufficient documentation

## 2020-01-23 DIAGNOSIS — X58XXXA Exposure to other specified factors, initial encounter: Secondary | ICD-10-CM | POA: Diagnosis not present

## 2020-01-23 DIAGNOSIS — K6289 Other specified diseases of anus and rectum: Secondary | ICD-10-CM | POA: Insufficient documentation

## 2020-01-23 DIAGNOSIS — I5022 Chronic systolic (congestive) heart failure: Secondary | ICD-10-CM

## 2020-01-23 DIAGNOSIS — K5730 Diverticulosis of large intestine without perforation or abscess without bleeding: Secondary | ICD-10-CM | POA: Insufficient documentation

## 2020-01-23 DIAGNOSIS — Z91013 Allergy to seafood: Secondary | ICD-10-CM | POA: Insufficient documentation

## 2020-01-23 DIAGNOSIS — K641 Second degree hemorrhoids: Secondary | ICD-10-CM | POA: Insufficient documentation

## 2020-01-23 DIAGNOSIS — Z95810 Presence of automatic (implantable) cardiac defibrillator: Secondary | ICD-10-CM

## 2020-01-23 DIAGNOSIS — Z9049 Acquired absence of other specified parts of digestive tract: Secondary | ICD-10-CM

## 2020-01-23 DIAGNOSIS — K6389 Other specified diseases of intestine: Secondary | ICD-10-CM | POA: Diagnosis not present

## 2020-01-23 DIAGNOSIS — Z8719 Personal history of other diseases of the digestive system: Secondary | ICD-10-CM

## 2020-01-23 DIAGNOSIS — K5732 Diverticulitis of large intestine without perforation or abscess without bleeding: Secondary | ICD-10-CM

## 2020-01-23 DIAGNOSIS — T185XXA Foreign body in anus and rectum, initial encounter: Secondary | ICD-10-CM | POA: Insufficient documentation

## 2020-01-23 HISTORY — PX: FLEXIBLE SIGMOIDOSCOPY: SHX5431

## 2020-01-23 HISTORY — PX: BIOPSY: SHX5522

## 2020-01-23 SURGERY — SIGMOIDOSCOPY, FLEXIBLE
Anesthesia: Monitor Anesthesia Care

## 2020-01-23 MED ORDER — PROPOFOL 500 MG/50ML IV EMUL
INTRAVENOUS | Status: DC | PRN
  Administered 2020-01-23: 75 ug/kg/min via INTRAVENOUS

## 2020-01-23 MED ORDER — PROPOFOL 10 MG/ML IV BOLUS
INTRAVENOUS | Status: DC | PRN
  Administered 2020-01-23 (×2): 10 mg via INTRAVENOUS

## 2020-01-23 MED ORDER — PHENYLEPHRINE HCL-NACL 10-0.9 MG/250ML-% IV SOLN
INTRAVENOUS | Status: DC | PRN
  Administered 2020-01-23: 50 ug/min via INTRAVENOUS

## 2020-01-23 MED ORDER — LACTATED RINGERS IV SOLN
INTRAVENOUS | Status: DC
  Administered 2020-01-23: 1000 mL via INTRAVENOUS

## 2020-01-23 MED ORDER — APIXABAN 5 MG PO TABS: 5.0000 mg | ORAL_TABLET | Freq: Two times a day (BID) | ORAL | 5 refills | Status: DC

## 2020-01-23 MED ORDER — SODIUM CHLORIDE 0.9 % IV SOLN: INTRAVENOUS | Status: DC

## 2020-01-23 MED ORDER — LIDOCAINE 2% (20 MG/ML) 5 ML SYRINGE
INTRAMUSCULAR | Status: DC | PRN
  Administered 2020-01-23: 8 mg via INTRAVENOUS

## 2020-01-23 SURGICAL SUPPLY — 22 items

## 2020-01-23 NOTE — Anesthesia Postprocedure Evaluation (Signed)
Anesthesia Post Note  Patient: Roberto [name]  Procedure(s) Performed: FLEXIBLE SIGMOIDOSCOPY (N/A ) BIOPSY     Patient location during evaluation: PACU Anesthesia Type: MAC Level of consciousness: awake and alert Pain management: pain level controlled Vital Signs Assessment: post-procedure vital signs reviewed and stable Respiratory status: spontaneous breathing, nonlabored ventilation, respiratory function stable and patient connected to nasal cannula oxygen Cardiovascular status: stable and blood pressure returned to baseline Postop Assessment: no apparent nausea or vomiting Anesthetic complications: no   No complications documented.  Last Vitals:  Vitals:   01/23/20 0811 01/23/20 0825  BP: (!) 96/57 (!) 92/57  Pulse: 69 67  Resp: (!) 22 16  Temp: (!) 36.2 C (!) 36.2 C  SpO2: 100% 97%    Last Pain:  Vitals:   01/23/20 0825  TempSrc:   PainSc: 0-No pain                 Matalyn Nawaz S

## 2020-01-23 NOTE — Op Note (Signed)
[facility] Patient Name: Roberto [name] Procedure Date : 01/23/2020 MRN: [id number] Attending MD: Justice Britain , MD Date of Birth: 06/17/1965 CSN: [id number] Age: 55 Admit Type: Inpatient Procedure:                Flexible Sigmoidoscopy Indications:              Personal history of digestive disease, Post                            surgical follow-up, Diverticulitis - history of                            leading to colonic obstruction with subsequent                            perforation leading to right hemicolectomy and                            end-ileostomy Providers:                Justice Britain, MD, Ladona Ridgel, Technician,                            Elmer Ramp. Tilden Dome, RN, Referring MD:             Denita Lung, MD, Mila Homer [place] Medicines:                Monitored Anesthesia Care Complications:            No immediate complications. Estimated Blood Loss:     Estimated blood loss was minimal. Procedure:                Pre-Anesthesia Assessment:                           - Prior to the procedure, a History and Physical                            was performed, and patient medications and                            allergies were reviewed. [name] tolerance of                            previous anesthesia was also reviewed. The risks                            and benefits of the procedure and the sedation                            options and risks were discussed with the patient.                            All questions were answered, and informed consent  was obtained. Prior Anticoagulants: The patient has                            taken Eliquis (apixaban), last dose was 3 days                            prior to procedure. ASA Grade Assessment: III - A                            patient with severe systemic disease. After                            reviewing the risks and benefits, the patient was                             deemed in satisfactory condition to undergo the                            procedure.                           After obtaining informed consent, the scope was                            passed under direct vision. The GIF-H190 ([id number])                            Olympus gastroscope was introduced through the anus                            and advanced to the the sigmoid colon. The flexible                            sigmoidoscopy was accomplished without difficulty.                            The patient tolerated the procedure. The quality of                            the bowel preparation was adequate. Scope In: 7:51:57 AM Scope Out: 8:01:46 AM Total Procedure Duration: 0 hours 9 minutes 49 seconds  Findings:      Normal mucosa was found at the end ileostomy and in the ileum proximal       to the ostomy.      The digital rectal exam findings include hemorrhoids and stool/mucoid       balls. Pertinent negatives include no other palpable rectal lesions. The       stool balls were partially removed with digital rectal manipulation.      A moderate amount of solid stool was found in the rectum and in the       recto-sigmoid colon, interfering with visualization. Lavage of the area       was performed using copious amounts, resulting in clearance with       adequate visualization.      Multiple small-mouthed  diverticula were found in the       recto-sigmoid/sigmoid colon end anastomosis (total rectal stump is       approximately 25 cm).      A suture was found at the recto-sigmoid/sigmoid colon end anastomosis.       Removal of suture was accomplished with a regular forceps.      A scattered areas of mildly erythematous mucosa were found in the       rectum, in the recto-sigmoid colon and in the sigmoid colon - query       diversion colitis. This was biopsied with a cold forceps for histology       to rule out chronic colitis.      Non-bleeding non-thrombosed  external and internal hemorrhoids were found       during retroflexion, during perianal exam and during digital exam. The       hemorrhoids were Grade II (internal hemorrhoids that prolapse but reduce       spontaneously). Impression:               - Normal mucosa proximal to the end-ileostomy as                            well as the ostomy.                           - Hemorrhoids and stool/mucoid balls found on                            digital rectal exam. Removed with digital                            manipulation.                           - Stool in the rectum and in the recto-sigmoid                            colon - lavaged.                           - Diverticulosis noted in the recto-sigmoid/sigmoid                            colon up to the end anastomosis.                           - Suture (foreign body) at anastomosis - removed.                           - Erythematous mucosa in the rectum, in the                            recto-sigmoid colon and in the sigmoid colon -                            query diversion colitis. Biopsied.                           -  Non-bleeding non-thrombosed external and internal                            hemorrhoids. Recommendation:           - The patient will be observed post-procedure,                            until all discharge criteria are met.                           - Discharge patient to home.                           - Patient has a contact number available for                            emergencies. The signs and symptoms of potential                            delayed complications were discussed with the                            patient. Return to normal activities tomorrow.                            Written discharge instructions were provided to the                            patient.                           - High fiber diet.                           - Await pathology results.                           - Restart  Eliquis on 1/11 AM (24 hours from                            completion of procedure).                           - May restart other medications as per normal basis.                           - Follow up with surgery in regards to attempt at                            ileocolonic reanastomosis intervention in future.                           - Consider repeat Colonoscopy (after completion of  surgery in 1-2 years at which point then normal                            intervals can be considered for colorectal cancer                            screening.                           - The findings and recommendations were discussed                            with the patient.                           - The findings and recommendations were discussed                            with the patient's family. Procedure Code(s):        --- Professional ---                           [phone], Sigmoidoscopy, flexible; with removal of                            foreign body(s)                           45331, Sigmoidoscopy, flexible; with biopsy, single                            or multiple Diagnosis Code(s):        --- Professional ---                           K64.1, Second degree hemorrhoids                           T18.5XXA, Foreign body in anus and rectum, initial                            encounter                           K62.89, Other specified diseases of anus and rectum                           K63.89, Other specified diseases of intestine                           Z87.19, Personal history of other diseases of the                            digestive system                           Z09, Encounter for follow-up examination after  completed treatment for conditions other than                            malignant neoplasm                           K57.32, Diverticulitis of large intestine without                            perforation or  abscess without bleeding                           K57.30, Diverticulosis of large intestine without                            perforation or abscess without bleeding CPT copyright 2019 American Medical Association. All rights reserved. The codes documented in this report are preliminary and upon coder review may  be revised to meet current compliance requirements. Justice Britain, MD 01/23/2020 8:26:25 AM Number of Addenda: 0

## 2020-01-23 NOTE — H&P (Signed)
GASTROENTEROLOGY PROCEDURE H&P NOTE   Primary Care Physician: [name], MD  HPI: Roberto [name] is a 55 y.o. male who presents for Flexible sigmoidoscopy/possible ileostomy evaluation prior to possible reanastomosis.  Past Medical History:  Diagnosis Date  . AICD (automatic cardioverter/defibrillator) present 08/2018   Medtronic ICD  . Arthritis    knee  . Chronic combined systolic and diastolic CHF (congestive heart failure) (HCC)   . Coronary artery disease   . Deep vein thrombosis (HCC) 2007   left leg  . Diverticulitis   . Hepatitis    patient denies this dx  . Lupus anticoagulant positive   . Morbid obesity (HCC)   . Morbid obesity with BMI of 50.0-59.9, adult (HCC)   . Mural thrombus of heart    coumadin  . Nonischemic cardiomyopathy (HCC)    a) 12/17/11 echo: LVEF 25-30%, grade 3 diastolic dysfunction (c/w restriction), mod MR, mod LA/LV and mild RA dilatation; b) 12/18/11 cMRI: LVEF 38%, mod LV/mild RV dilatation, global HK, mild-mod RV sys dysfxn, no LV thrombus & patchy non-subendocardial delayed enhancement c/w infil dz or prior myocarditis; c. 07/2018 EF 25-30%.  . Paroxysmal SVT (supraventricular tachycardia) (HCC)   . Pneumonia    x 2  . Pulmonary embolism (HCC)    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  . Wears glasses   . Wound of abdomen    Past Surgical History:  Procedure Laterality Date  . A-FLUTTER ABLATION N/A 11/17/2016   Procedure: A-FLUTTER ABLATION;  Surgeon: Regan Lemming, MD;  Location: [facility] INVASIVE CV LAB;  Service: Cardiovascular;  Laterality: N/A;  . APPLICATION OF WOUND VAC N/A 09/05/2019   Procedure: APPLICATION OF WOUND VAC;  Surgeon: Allena Napoleon, MD;  Location: [facility] OR;  Service: Plastics;  Laterality: N/A;  PATIENT'S HOME WOUND VAC APPLIED.  [name] BIOPSY  12/23/2018   Procedure: BIOPSY;  Surgeon: Meridee Score Netty Starring., MD;  Location: [facility] ENDOSCOPY;  Service: Gastroenterology;;  . CARDIAC CATHETERIZATION  06/2006    Angiographically normal cors  . CARDIAC CATHETERIZATION  12/22/2011   R/LHC: normal cors, well-compensated HDs, LV dysfxn  . CARDIOVERSION N/A 10/17/2016   Procedure: CARDIOVERSION;  Surgeon: Dolores Patty, MD;  Location: [facility] ENDOSCOPY;  Service: Cardiovascular;  Laterality: N/A;  . CARDIOVERSION N/A 03/20/2017   Procedure: CARDIOVERSION;  Surgeon: Dolores Patty, MD;  Location: [facility] ENDOSCOPY;  Service: Cardiovascular;  Laterality: N/A;  . COLECTOMY N/A 12/28/2018   Procedure: TOTAL COLECTOMY;  Surgeon: Emelia Loron, MD;  Location: [facility] OR;  Service: General;  Laterality: N/A;  . FLEXIBLE SIGMOIDOSCOPY N/A 12/23/2018   Procedure: FLEXIBLE SIGMOIDOSCOPY;  Surgeon: Lemar Lofty., MD;  Location: [facility] ENDOSCOPY;  Service: Gastroenterology;  Laterality: N/A;  . ICD IMPLANT N/A 09/03/2018   Procedure: ICD Medtronic IMPLANT;  Surgeon: Regan Lemming, MD;  Location: [facility] INVASIVE CV LAB;  Service: Cardiovascular;  Laterality: N/A;  . ILEOSTOMY N/A 12/28/2018   Procedure: ILEOSTOMY;  Surgeon: Emelia Loron, MD;  Location: [facility] OR;  Service: General;  Laterality: N/A;  . IR RADIOLOGIST EVAL & MGMT  02/08/2019  . JOINT REPLACEMENT     right knee  . LEFT AND RIGHT HEART CATHETERIZATION WITH CORONARY ANGIOGRAM N/A 12/22/2011   Procedure: LEFT AND RIGHT HEART CATHETERIZATION WITH CORONARY ANGIOGRAM;  Surgeon: Dolores Patty, MD;  Location: [facility] CATH LAB;  Service: Cardiovascular;  Laterality: N/A;  . PATELLAR TENDON REPAIR     Left  . RIGHT HEART CATH N/A 10/20/2016   Procedure: RIGHT  HEART CATH;  Surgeon: Dolores Patty, MD;  Location: [facility] INVASIVE CV LAB;  Service: Cardiovascular;  Laterality: N/A;  . SKIN SPLIT GRAFT N/A 09/05/2019   Procedure: SKIN GRAFT SPLIT THICKNESS;  Surgeon: Allena Napoleon, MD;  Location: [facility] OR;  Service: Plastics;  Laterality: N/A;  . TEE WITHOUT CARDIOVERSION N/A 03/20/2017   Procedure: TRANSESOPHAGEAL ECHOCARDIOGRAM (TEE);  Surgeon: Dolores Patty, MD;  Location: [facility] ENDOSCOPY;  Service: Cardiovascular;  Laterality: N/A;   Current Facility-Administered Medications  Medication Dose Route Frequency Provider Last Rate Last Admin  . 0.9 %  sodium chloride infusion   Intravenous Continuous Mansouraty, Netty Starring., MD      . lactated ringers infusion   Intravenous Continuous Mansouraty, Netty Starring., MD 10 mL/hr at 01/23/20 0731 Continued from Pre-op at 01/23/20 0731   Allergies  Allergen Reactions  . Shrimp [Shellfish Allergy] Hives and Itching   Family History  Problem Relation Age of Onset  . Hypertension Mother   . Clotting disorder Mother        mom with PEs  . Cancer Mother        uterine cancer  . Diabetes Father   . Heart attack Father   . Hypertension Other        Sibling  . Diabetes Other   . Obesity Other   . Diabetes Sister   . Obesity Sister   . Obesity Sister   . Colon cancer Neg Hx   . Stomach cancer Neg Hx   . Esophageal cancer Neg Hx   . Rectal cancer Neg Hx   . Inflammatory bowel disease Neg Hx   . Liver disease Neg Hx   . Pancreatic cancer Neg Hx    Social History   Socioeconomic History  . Marital status: Divorced    Spouse name: Not on file  . Number of children: 5  . Years of education: Not on file  . Highest education level: Not on file  Occupational History  . 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 Smoking status: Never Smoker  . Smokeless tobacco: Never Used  Vaping Use  . Vaping Use: Never used  Substance and Sexual Activity  . Alcohol use: Not Currently    Comment: Rarely  . Drug use: No  . Sexual activity: Not on file  Other Topics Concern  . Not on file  Social History Narrative   Lives in [place] 03/2015, 5 children.   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  Exercises with weights and some walking. 12/2015   Social Determinants of Health   Financial Resource Strain:  Not on file  Food Insecurity: Not on file  Transportation Needs: Not on file  Physical Activity: Not on file  Stress: Not on file  Social Connections: Not on file  Intimate Partner Violence: Not on file    Physical Exam: Vital signs in last 24 hours: Temp:  [98.1 F (36.7 C)] 98.1 F (36.7 C) (01/10 0644) Pulse Rate:  [72] 72 (01/10 0644) Resp:  [25] 25 (01/10 0644) BP: (104-110)/(40-57) 104/57 (01/10 0651) SpO2:  [97 %] 97 % (01/10 0644) Weight:  [154.2 kg] 154.2 kg (01/10 0644)   GEN: NAD EYE: Sclerae anicteric ENT: MMM CV: Non-tachycardic GI: Soft, NT/ND NEURO:  Alert & Oriented x 3  Lab Results: No results for input(s): WBC, HGB, HCT, PLT in the last 72 hours.  BMET No results for input(s): NA, K, CL, CO2, GLUCOSE, BUN, CREATININE, CALCIUM in the last 72 hours. LFT No results for input(s): PROT, ALBUMIN, AST, ALT, ALKPHOS, BILITOT, BILIDIR, IBILI in the last 72 hours. PT/INR No results for input(s): LABPROT, INR in the last 72 hours.   Impression / Plan: This is a 55 y.o.male who presents for Flexible sigmoidoscopy/possible ileostomy evaluation prior to possible reanastomosis.  The risks and benefits of endoscopic evaluation were discussed with the patient; these include but are not limited to the risk of perforation, infection, bleeding, missed lesions, lack of diagnosis, severe illness requiring hospitalization, as well as anesthesia and sedation related illnesses.  The patient is agreeable to proceed.    Corliss Parish, MD [facility] Advanced [facility]oscopy Office # [phone]

## 2020-01-23 NOTE — Anesthesia Procedure Notes (Signed)
Procedure Name: MAC Date/Time: 01/23/2020 7:47 AM Performed by: Rande Brunt, CRNA Pre-anesthesia Checklist: Patient identified, Emergency Drugs available, Suction available, Patient being monitored and Timeout performed Patient Re-evaluated:Patient Re-evaluated prior to induction Oxygen Delivery Method: Simple face mask Induction Type: IV induction Placement Confirmation: positive ETCO2,  CO2 detector and breath sounds checked- equal and bilateral Dental Injury: Teeth and Oropharynx as per pre-operative assessment

## 2020-01-23 NOTE — Transfer of Care (Signed)
Immediate Anesthesia Transfer of Care Note  Patient: Roberto [name]  Procedure(s) Performed: FLEXIBLE SIGMOIDOSCOPY (N/A ) BIOPSY  Patient Location: PACU  Anesthesia Type:MAC  Level of Consciousness: awake, alert  and patient cooperative  Airway & Oxygen Therapy: Patient Spontanous Breathing and Patient connected to nasal cannula oxygen  Post-op Assessment: Report given to RN, Post -op Vital signs reviewed and stable and Patient moving all extremities  Post vital signs: Reviewed and stable  Last Vitals:  Vitals Value Taken Time  BP 96/57 01/23/20 0811  Temp    Pulse 70 01/23/20 0811  Resp 29 01/23/20 0811  SpO2 95 % 01/23/20 0811  Vitals shown include unvalidated device data.  Last Pain:  Vitals:   01/23/20 0644  TempSrc: Oral  PainSc: 0-No pain         Complications: No complications documented.

## 2020-01-24 LAB — SURGICAL PATHOLOGY

## 2020-01-25 ENCOUNTER — Encounter: Payer: Self-pay | Admitting: Gastroenterology

## 2020-01-27 ENCOUNTER — Telehealth: Payer: Self-pay

## 2020-01-27 ENCOUNTER — Ambulatory Visit (INDEPENDENT_AMBULATORY_CARE_PROVIDER_SITE_OTHER): Payer: BC Managed Care – PPO

## 2020-01-27 DIAGNOSIS — I472 Ventricular tachycardia, unspecified: Secondary | ICD-10-CM

## 2020-01-27 LAB — CUP PACEART REMOTE DEVICE CHECK
Battery Remaining Longevity: 126 mo
Battery Voltage: 3.03 V
Brady Statistic RV Percent Paced: 0.03 %
Date Time Interrogation Session: 20220114054345
HighPow Impedance: 76 Ohm
Implantable Lead Implant Date: 20200821
Implantable Lead Location: 753860
Implantable Lead Model: 6935
Implantable Pulse Generator Implant Date: 20200821
Lead Channel Impedance Value: 304 Ohm
Lead Channel Impedance Value: 418 Ohm
Lead Channel Pacing Threshold Amplitude: 0.625 V
Lead Channel Pacing Threshold Pulse Width: 0.4 ms
Lead Channel Sensing Intrinsic Amplitude: 7.125 mV
Lead Channel Sensing Intrinsic Amplitude: 7.125 mV
Lead Channel Setting Pacing Amplitude: 2 V
Lead Channel Setting Pacing Pulse Width: 0.4 ms
Lead Channel Setting Sensing Sensitivity: 0.3 mV

## 2020-01-27 NOTE — Telephone Encounter (Signed)
Remote ICM transmission received.  Attempted call to patient regarding ICM remote transmission and left message to return call   

## 2020-01-27 NOTE — Progress Notes (Signed)
EPIC Encounter for ICM Monitoring  Patient Name: Roberto [name] is a 55 y.o. male Date: 01/27/2020 Primary Care Physican: [name], MD Primary Cardiologist:Bensimhon Electrophysiologist:Camnitz 12/28/2019 Office Weight:351lbs   Time in AF0.0 hr/day (0.0%)  Attempted call to patient and unable to reach.  Left message to return call. Transmission reviewed.   Optivol thoracic impedance normal on 01/27/2020 but was suggesting fluid accumulation from 12/23/19 to 01/26/2020.  Prescribed:   Torsemide20 mg Take 1 tablet (20 mg total) by mouth 2 (two) times daily. Take an extra tablet as needed  Potassium 20 mEq take 1 tablet daily. Take 40 mEq with Metolazone.  Spironolactone 25 mg take 1 tablet daily  Metolazone 2.5 mg take 1 tablet weekly if needed (Per Dr Elijah Birk 12/14/19 OV note)  Labs: 01/16/2020 Creatinine 1.64, BUN 23, Potassium 4.0, Sodium 137 12/28/2019 Creatinine 1.48, BUN 17, Potassium 4.9, Sodium 138, GFR 56 12/14/2019 Creatinine1.57, BUN18, Potassium3.5, Sodium141, GFR 52 11/25/2019 Creatinine1.31, BUN20, Potassium3.7, Sodium139  09/13/2019 Creatinine1.39, BUN10, Potassium4.0, Sodium136, GFR>60 A complete set of results can be found in Results Review.  Recommendations: Unable to reach.    Follow-up plan: ICM clinic phone appointment on2/21/2022. 91 day device clinic remote transmission 04/27/2020.   EP/Cardiology Office Visits: 2/15/2022Advanced HF clinic.   Copy of ICM check sent to Dr.Cammnitz.    3 month ICM trend: 01/27/2020.    1 Year ICM trend:       Karie Soda, RN 01/27/2020 9:45 AM

## 2020-02-04 ENCOUNTER — Other Ambulatory Visit (HOSPITAL_COMMUNITY): Payer: Self-pay | Admitting: Cardiology

## 2020-02-04 ENCOUNTER — Other Ambulatory Visit: Payer: Self-pay | Admitting: Internal Medicine

## 2020-02-04 ENCOUNTER — Other Ambulatory Visit: Payer: Self-pay | Admitting: Family Medicine

## 2020-02-06 ENCOUNTER — Other Ambulatory Visit: Payer: Self-pay

## 2020-02-06 MED ORDER — TORSEMIDE 20 MG PO TABS: ORAL_TABLET | ORAL | 0 refills | Status: DC

## 2020-02-09 NOTE — Progress Notes (Signed)
Remote ICD transmission.   

## 2020-02-10 ENCOUNTER — Telehealth: Payer: BC Managed Care – PPO | Admitting: Family Medicine

## 2020-02-10 ENCOUNTER — Encounter: Payer: Self-pay | Admitting: Family Medicine

## 2020-02-10 DIAGNOSIS — I5022 Chronic systolic (congestive) heart failure: Secondary | ICD-10-CM | POA: Diagnosis not present

## 2020-02-10 DIAGNOSIS — Z7901 Long term (current) use of anticoagulants: Secondary | ICD-10-CM

## 2020-02-10 DIAGNOSIS — I428 Other cardiomyopathies: Secondary | ICD-10-CM

## 2020-02-10 DIAGNOSIS — Z939 Artificial opening status, unspecified: Secondary | ICD-10-CM

## 2020-02-10 DIAGNOSIS — Z9049 Acquired absence of other specified parts of digestive tract: Secondary | ICD-10-CM

## 2020-02-10 DIAGNOSIS — I471 Supraventricular tachycardia: Secondary | ICD-10-CM

## 2020-02-10 DIAGNOSIS — Z95810 Presence of automatic (implantable) cardiac defibrillator: Secondary | ICD-10-CM

## 2020-02-10 NOTE — Progress Notes (Signed)
   Subjective:    Patient ID: Roberto [name], male    DOB: April 26, 1965, 55 y.o.   MRN: [id number]  HPI I connected with  Roberto [name] on 02/10/20 by a video enabled telemedicine application and verified that I am speaking with the correct person using two identifiers.  [employer]glity QZ:RAQTMA Patient:home  I discussed the limitations of evaluation and management by telemedicine. The patient expressed understanding and agreed to proceed. He is here for an interval evaluation.  He is now working from home.  Apparently the defib unit did go off recently and he is now unable to drive because of that.  He does have an underlying history of chronic systolic heart failure, atrial fib, PSVT.  He continues on Eliquis for chronic anticoagulation.  He does have a previous history of diverticulitis and subsequent colon resection.  He is now being evaluated for reanastomosis.  Has a consult set up for February 14 to consider that.   Review of Systems     Objective:   Physical Exam Alert and in no distress otherwise not examined The medical record was reviewed in regard to cardiology, general surgery, ICD implantation.      Assessment & Plan:  Obesity, morbid (HCC)  Nonischemic cardiomyopathy (HCC)  Chronic systolic heart failure (HCC)  Chronic anticoagulation  Paroxysmal SVT (supraventricular tachycardia) (HCC)  History of colon resection  History of creation of ostomy [facility])  AICD (automatic cardioverter/defibrillator) present He will continue on his present medication regimen which revolve around his cardiac issues.  He will follow up with surgery concerning reanastomosis.  Approximately 35 minutes spent in reviewing his medical record today documentation and counseling.

## 2020-02-11 ENCOUNTER — Other Ambulatory Visit: Payer: Self-pay | Admitting: Internal Medicine

## 2020-02-20 ENCOUNTER — Encounter (HOSPITAL_COMMUNITY): Payer: BC Managed Care – PPO | Admitting: Internal Medicine

## 2020-02-21 ENCOUNTER — Telehealth (HOSPITAL_COMMUNITY): Payer: Self-pay | Admitting: Pharmacy Technician

## 2020-02-21 ENCOUNTER — Telehealth: Payer: Self-pay

## 2020-02-21 NOTE — Telephone Encounter (Signed)
Received a message from the patient stating that he needed a PA for Eliquis. Upon investigation, a PA is not needed at this time. When I ran a test claim, it stated that the pa on file was good until 02/20/21.  Current 30 day co-pay $153  Was able to activate and confirm that a $10 co-pay card for the patient would work. He received a text message with the billing information to provide to the pharmacy. Advised him to call with any further issues.  Archer Asa, CPhT

## 2020-02-21 NOTE — Telephone Encounter (Signed)
**Note De-Identified [place] Obfuscation** I started a Eliquis PA through covermymeds. Key: Angelene Giovanni

## 2020-02-23 ENCOUNTER — Encounter (HOSPITAL_COMMUNITY): Payer: BC Managed Care – PPO | Admitting: Internal Medicine

## 2020-02-28 ENCOUNTER — Encounter (HOSPITAL_COMMUNITY): Payer: BC Managed Care – PPO

## 2020-03-05 ENCOUNTER — Telehealth: Payer: Self-pay

## 2020-03-05 ENCOUNTER — Ambulatory Visit (INDEPENDENT_AMBULATORY_CARE_PROVIDER_SITE_OTHER): Payer: BC Managed Care – PPO

## 2020-03-05 DIAGNOSIS — Z95810 Presence of automatic (implantable) cardiac defibrillator: Secondary | ICD-10-CM | POA: Diagnosis not present

## 2020-03-05 DIAGNOSIS — I5022 Chronic systolic (congestive) heart failure: Secondary | ICD-10-CM | POA: Diagnosis not present

## 2020-03-05 NOTE — Telephone Encounter (Signed)
Remote ICM transmission received.  Attempted call to patient regarding ICM remote transmission and left message to return call   

## 2020-03-05 NOTE — Progress Notes (Signed)
EPIC Encounter for ICM Monitoring  Patient Name: Roberto [name] is a 55 y.o. male Date: 03/05/2020 Primary Care Physican: [name], MD Primary Cardiologist:Bensimhon Electrophysiologist:Camnitz 02/10/2020 Office Weight:339lbs   VT-NS (>4 beats, >200 bpm) 2  Time in AF0.0 hr/day (0.0%)  Attempted call to patient and unable to reach.  Left message to return call. Transmission reviewed.   Optivol thoracic impedancesuggesting fluid accumulation starting 02/20/2020 but trending back toward baseline.  Prescribed:   Torsemide20 mgTake 1 tablet (20 mg total) by mouth 2 (two) times daily. Take an extra tablet as needed  Potassium 20 mEq take 1 tablet daily. Take 40 mEq with Metolazone.  Spironolactone 25 mg take 1 tablet daily  Labs: 01/16/2020 Creatinine 1.64, BUN 23, Potassium 4.0, Sodium 137 12/28/2019 Creatinine 1.48, BUN 17, Potassium 4.9, Sodium 138, GFR 56 12/14/2019 Creatinine1.57, BUN18, Potassium3.5, Sodium141, GFR 52 11/25/2019 Creatinine1.31, BUN20, Potassium3.7, Sodium139  09/13/2019 Creatinine1.39, BUN10, Potassium4.0, Sodium136, GFR>60 A complete set of results can be found in Results Review.  Recommendations:Unable to reach.  Patient has OV on 2/24 with HF Clinic  Follow-up plan: ICM clinic phone appointment on3/28/2022 since fluid levels will be rechecked on 2/24 OV. 91 day device clinic remote transmission 04/27/2020.   EP/Cardiology Office Visits:2/24/2022Advanced HF clinic.   Copy of ICM check sent to [facility].  3 month ICM trend: 03/05/2020.    1 Year ICM trend:       Karie Soda, RN 03/05/2020 10:29 AM

## 2020-03-07 NOTE — Progress Notes (Signed)
Advanced Heart Failure Clinic Note   Patient ID: Roberto [name], male   DOB: 08-07-1965, 55 y.o.   MRN: [id number] PCP: Dr. Susann Givens Cardiologist: Dr. [name] EP: Dr. Johney Frame AHF: Dr. Gala Romney   HPI: Roberto [name] is a 55 y.o. 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   Admitted to [facility] 12/16/11 for recurrent HF. Cath with no CAD.   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 EF dropped from 40%-> 15%. Underwent successful DCCV. Started on amiodarone.  S/p AFL ablation 11/17/2016  Pt underwent outpatient DCCV 03/20/17, then came back that evening with worsening SOB and CP. Diuresed with IV lasix. Also treated for cellulitis of his RLE, and gout. Discharge weight 280 lbs.  Echo 7/20: EF 25-30%   Admitted 12/19/18- 01/24/19 for acute diverticulits. Failed IV abx. Underwent R colectomy with ileostomy.  In OR patient became hypotensive, required pressors. Course c/b perforated R colon with right lateral peritoneal fluid collection s/p right lateral abdominal drain placement in IR 01/04/2019. Post-op struggling with non-healing ab wound and  underwent skin grafting on 09/05/19.  We saw him on 12/14/19, doing well volume overloaded. Instructed to take 1 dose of metolazone, He took 2 doses & developed hypokalemia. Had syncope with head trauma and ICD shock. ICD interrogation showed VT. He has had K and mag checked subsequently by [facility] and both ok. Follow up 2 weeks later fluid was better.   Today he returns for HF follow up. Discouraged with news that he cannot have ileostomy reversed yet, needs further abdominal wall surgery due to hernia. Continuing with cardio/weights ~3-4x/week, now up to 20 minutes on TM. Overall feeling good. Denies increasing SOB, CP, dizziness, edema, or PND/Orthopnea. Appetite ok., he and GF cook meals at home. No fever or chills.  Weight at home ~359 pounds. Taking all medications. Has not needed extra torsemide or metolazone since ICD shock in December.  Cardiac studies: Echo (12/21): EF 20-25%, RV ok, personally reviewed Echo (07/2017): EF appears slightly improved to 20-25%. Possible clot (vs artifact in IVC/RA junction) Personally reviewed Echo (10/15/16): LVEF 15% Echo (5/14): EF 40-45% Echo (12/13): EF 25-30%  TEE (03/20/17): EF 15-20%, Severe reduced RV function  RHC 10/20/16  RA = 3 RV = 45/7 PA = 49/20 (31) PCW = 28 Fick cardiac output/index = 5.0/2.1 PVR = 0.5 WU Ao sat = 95% PA sat = 64%, 61%  CARDIAC MRI - 12/18/11  1. Moderately dilated left ventricle with moderately decreased systolic function, EF 38%. Global hypokinesis.  2. Mildly dilated right ventricle with mild to moderately decreased systolic function.  3. No definite LV thrombus noted.  4. Patchy non-subendocardial delayed enhancement seen in the ventricular septum (see above for description). This is not suggestive of a coronary disease pattern. This could be suggestive of infiltrative disease versus prior myocarditis.  RHC/LHC 12/22/11  Cors: Normal  SH: Lives with his wife and 5 children. Works full time running a Daycare  Review of systems complete and found to be negative unless listed in HPI.    Past Medical History:  Diagnosis Date  . AICD (automatic cardioverter/defibrillator) present 08/2018   Medtronic ICD  . Arthritis    knee  . Chronic combined systolic and diastolic CHF (congestive heart failure) (HCC)   . Coronary artery disease   . Deep vein thrombosis (HCC) 2007   left leg  .  Diverticulitis   . Hepatitis    patient denies this dx  . Lupus anticoagulant positive   . Morbid obesity (HCC)   . Morbid obesity with BMI of 50.0-59.9, adult (HCC)   . Mural thrombus of heart    coumadin  . Nonischemic cardiomyopathy (HCC)    a) 12/17/11 echo: LVEF 25-30%, grade 3 diastolic dysfunction (c/w restriction), mod MR, mod LA/LV  and mild RA dilatation; b) 12/18/11 cMRI: LVEF 38%, mod LV/mild RV dilatation, global HK, mild-mod RV sys dysfxn, no LV thrombus & patchy non-subendocardial delayed enhancement c/w infil dz or prior myocarditis; c. 07/2018 EF 25-30%.  . Paroxysmal SVT (supraventricular tachycardia) (HCC)   . Pneumonia    x 2  . Pulmonary embolism (HCC)    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  . Wears glasses   . Wound of abdomen     Current Outpatient Medications  Medication Sig Dispense Refill  . amiodarone (PACERONE) 200 MG tablet Take 1 tablet (200 mg total) by mouth daily. 30 tablet 5  . apixaban (ELIQUIS) 5 MG TABS tablet Take 1 tablet (5 mg total) by mouth 2 (two) times daily. 60 tablet 5  . carvedilol (COREG) 3.125 MG tablet TAKE 1 TABLET(3.125 MG) BY MOUTH TWICE DAILY WITH A MEAL 180 tablet 3  . coconut oil OIL Apply 1 application topically daily as needed (dry skin).     [name] digoxin (LANOXIN) 0.125 MG tablet Take 1 tablet (0.125 mg total) by mouth daily. 30 tablet 3  . metolazone (ZAROXOLYN) 2.5 MG tablet Take 1 tablet (2.5 mg total) by mouth once for 1 dose. 1 tablet 0  . sacubitril-valsartan (ENTRESTO) 49-51 MG Take 1 tablet by mouth 2 (two) times daily. 60 tablet 4  . torsemide (DEMADEX) 20 MG tablet TAKE 1 TABLET BY MOUTH TWICE DAILY, MAY TAKE 1 EXTRA TABLET AS NEEDED 75 tablet 0  . potassium chloride SA (KLOR-CON) 20 MEQ tablet Take 1 tablet (20 mEq total) by mouth daily. 30 tablet 3  . spironolactone (ALDACTONE) 25 MG tablet Take 1 tablet (25 mg total) by mouth daily. 30 tablet 3   No current facility-administered medications for this encounter.   PHYSICAL EXAM: Vitals:   03/08/20 1044  BP: 100/80  Pulse: 65  SpO2: 96%  Weight: (!) 164.2 kg (362 lb)   Wt Readings from Last 3 Encounters:  03/08/20 (!) 164.2 kg (362 lb)  02/10/20 (!) 153.8 kg (339 lb)  01/23/20 (!) 154.2 kg (339 lb 15.2 oz)    PHYSICAL EXAM: General:  NAD. No resp difficulty HEENT: Normal Neck: Supple. Thick neck,  difficult to assess but JVP appears to be ~7-8. Carotids 2+ bilat; no bruits. No lymphadenopathy or thryomegaly appreciated. Cor: PMI nondisplaced. Regular rate & rhythm. No rubs, gallops or murmurs. Lungs: Clear, diminished in bases Abdomen: Morbidly obese, +distended. No hepatosplenomegaly. No bruits or masses. Good bowel sounds. Extremities: No cyanosis, clubbing, rash, 2+ bilateral pitting edema to knees Neuro: Alert & oriented x 3, cranial nerves grossly intact. Moves all 4 extremities w/o difficulty. Affect pleasant.  ICD interrogation: OptiVol below threshold but trending up since beginning of February, thoracic impedence just below threshold, activity ~1-2 hrs/day, no VT/VF (personally reviewed).  ECG: SR, ? RBBB, 67 bpm (personally reviewed).  ASSESSMENT & PLAN:  1. VT - On 12/16/19 in setting of hypokalemia related to metolazone use. - Limit metoalzone use. Take extra 40 mEq of KCl with each dose. - No driving for 6 months (~3/61). - BMET and Mag  today.  2. Chronic Systolic Heart Failure: - Echo 10/15/16 - LVEF 15% - TEE 03/2017 LVEF 15-20% - Echo 07/2017 with LVEF 20-25% - Echo 7/20 EF 25-30% - Echo 12/21 EF 25-30% - Stable NYHA II. Volume up today on exam, weight up ~20 lbs since 12/21. - Increase spiro to 25 mg daily. Will need close lab follow up. - Take metolazone 2.5 mg x 1 today + extra 40 mEq of KCl today. - May back off on KCl supplement with increasing spiro, but with history of VT with hypokalemia, will hold off until repeat labs. - Continue torsemide 20 mg bid.  - Continue digoxin 0.125 mg daily.  - Continue carvedilol 3.125 bid. - Continue Entresto to 49/51 mg bid. BP too low to increase today. - Would add [place] after ileostomy reversal. - BMET, BNP and dig level today.  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 - Maintaining NSR. - Currently on amiodarone 200 mg daily and Eliquis 5 bid; no bleeding issues. - No change.  4. H/o Acute diverticulitis with  colon resection and ileostomy - Non-healing wound. S/p skin grafting 09/05/19. - Abdominal wound healed, plans for reversal of ileostomy after extensive abdominal hernia repair scheduled. - Ok to proceed from HF standpoint.   Follow up next week in APP clinic to assess fluid status; needs close follow up with labs.  Jacklynn Ganong, FNP -[facility] 03/08/20

## 2020-03-08 ENCOUNTER — Ambulatory Visit (HOSPITAL_COMMUNITY)
Admission: RE | Admit: 2020-03-08 | Discharge: 2020-03-08 | Disposition: A | Discharge status: Home / Self Care | Payer: BC Managed Care – PPO | Source: Ambulatory Visit | Attending: Family Medicine | Admitting: Family Medicine

## 2020-03-08 ENCOUNTER — Other Ambulatory Visit: Payer: Self-pay

## 2020-03-08 ENCOUNTER — Encounter (HOSPITAL_COMMUNITY): Payer: Self-pay

## 2020-03-08 VITALS — BP 100/80 | HR 65 | Wt 362.0 lb

## 2020-03-08 DIAGNOSIS — I480 Paroxysmal atrial fibrillation: Secondary | ICD-10-CM

## 2020-03-08 DIAGNOSIS — I5022 Chronic systolic (congestive) heart failure: Secondary | ICD-10-CM | POA: Insufficient documentation

## 2020-03-08 DIAGNOSIS — Z9049 Acquired absence of other specified parts of digestive tract: Secondary | ICD-10-CM | POA: Insufficient documentation

## 2020-03-08 DIAGNOSIS — Z6841 Body Mass Index (BMI) 40.0 and over, adult: Secondary | ICD-10-CM | POA: Diagnosis not present

## 2020-03-08 DIAGNOSIS — Z5181 Encounter for therapeutic drug level monitoring: Secondary | ICD-10-CM | POA: Diagnosis not present

## 2020-03-08 DIAGNOSIS — Z7901 Long term (current) use of anticoagulants: Secondary | ICD-10-CM | POA: Diagnosis not present

## 2020-03-08 DIAGNOSIS — I471 Supraventricular tachycardia: Secondary | ICD-10-CM | POA: Insufficient documentation

## 2020-03-08 DIAGNOSIS — I4892 Unspecified atrial flutter: Secondary | ICD-10-CM | POA: Diagnosis not present

## 2020-03-08 DIAGNOSIS — I429 Cardiomyopathy, unspecified: Secondary | ICD-10-CM | POA: Insufficient documentation

## 2020-03-08 DIAGNOSIS — Z8719 Personal history of other diseases of the digestive system: Secondary | ICD-10-CM | POA: Diagnosis not present

## 2020-03-08 DIAGNOSIS — Z86711 Personal history of pulmonary embolism: Secondary | ICD-10-CM | POA: Insufficient documentation

## 2020-03-08 DIAGNOSIS — Z86718 Personal history of other venous thrombosis and embolism: Secondary | ICD-10-CM | POA: Diagnosis not present

## 2020-03-08 DIAGNOSIS — I472 Ventricular tachycardia, unspecified: Secondary | ICD-10-CM

## 2020-03-08 DIAGNOSIS — Z79899 Other long term (current) drug therapy: Secondary | ICD-10-CM | POA: Diagnosis not present

## 2020-03-08 DIAGNOSIS — Z95810 Presence of automatic (implantable) cardiac defibrillator: Secondary | ICD-10-CM | POA: Diagnosis not present

## 2020-03-08 DIAGNOSIS — Z932 Ileostomy status: Secondary | ICD-10-CM | POA: Insufficient documentation

## 2020-03-08 DIAGNOSIS — I872 Venous insufficiency (chronic) (peripheral): Secondary | ICD-10-CM | POA: Diagnosis not present

## 2020-03-08 LAB — BASIC METABOLIC PANEL
Anion gap: 9 (ref 5–15)
BUN: 18 mg/dL (ref 6–20)
CO2: 27 mmol/L (ref 22–32)
Calcium: 8.4 mg/dL — ABNORMAL LOW (ref 8.9–10.3)
Chloride: 102 mmol/L (ref 98–111)
Creatinine, Ser: 1.6 mg/dL — ABNORMAL HIGH (ref 0.61–1.24)
GFR, Estimated: 51 mL/min — ABNORMAL LOW (ref >60)
Glucose, Bld: 207 mg/dL — ABNORMAL HIGH (ref 70–99)
Potassium: 4 mmol/L (ref 3.5–5.1)
Sodium: 138 mmol/L (ref 135–145)

## 2020-03-08 LAB — MAGNESIUM: Magnesium: 2 mg/dL (ref 1.7–2.4)

## 2020-03-08 LAB — DIGOXIN LEVEL: Digoxin Level: 0.8 ng/mL (ref 0.8–2.0)

## 2020-03-08 MED ORDER — POTASSIUM CHLORIDE CRYS ER 20 MEQ PO TBCR: 20.0000 meq | EXTENDED_RELEASE_TABLET | Freq: Every day | ORAL | 3 refills | Status: DC

## 2020-03-08 MED ORDER — METOLAZONE 2.5 MG PO TABS: 2.5000 mg | ORAL_TABLET | Freq: Once | ORAL | 0 refills | Status: DC

## 2020-03-08 MED ORDER — SPIRONOLACTONE 25 MG PO TABS: 25.0000 mg | ORAL_TABLET | Freq: Every day | ORAL | 3 refills | Status: DC

## 2020-03-08 NOTE — Patient Instructions (Signed)
INCREASE Spironolactone to 25mg , one tab daily START Metolazone 2.5mg , one dose 03/09/2020 Take an ADDITIONAL 40 meq of potassium with you dose of Metolazone  Labs today We will only contact you if something comes back abnormal or we need to make some changes. Otherwise no news is good news!   Your physician recommends that you schedule a follow-up appointment in: 1 week  in the Advanced Practitioners (PA/NP) Clinic    Do the following things EVERYDAY: 1) Weigh yourself in the morning before breakfast. Write it down and keep it in a log. 2) Take your medicines as prescribed 3) Eat low salt foods--Limit salt (sodium) to 2000 mg per day.  4) Stay as active as you can everyday 5) Limit all fluids for the day to less than 2 liters At the Advanced Heart Failure Clinic, you and your health needs are our priority. As part of our continuing mission to provide you with exceptional heart care, we have created designated Provider Care Teams. These Care Teams include your primary Cardiologist (physician) and Advanced Practice Providers (APPs- Physician Assistants and Nurse Practitioners) who all work together to provide you with the care you need, when you need it.   You may see any of the following providers on your designated Care Team at your next follow up: 03/11/2020 Dr [name] . Dr Arvilla Meres . Dr Marca Ancona . Thornell Mule, NP . Tonye Becket, PA . Robbie Lis [place],NP . Shanda Bumps, PharmD   Please be sure to bring in all your medications bottles to every appointment.

## 2020-03-15 ENCOUNTER — Encounter (HOSPITAL_COMMUNITY): Payer: BC Managed Care – PPO

## 2020-03-28 ENCOUNTER — Other Ambulatory Visit: Payer: Self-pay | Admitting: Cardiology

## 2020-03-29 ENCOUNTER — Encounter (HOSPITAL_COMMUNITY): Payer: Self-pay

## 2020-03-29 ENCOUNTER — Ambulatory Visit (HOSPITAL_COMMUNITY)
Admission: RE | Admit: 2020-03-29 | Discharge: 2020-03-29 | Disposition: A | Discharge status: Home / Self Care | Payer: BC Managed Care – PPO | Source: Ambulatory Visit | Attending: Cardiology | Admitting: Cardiology

## 2020-03-29 ENCOUNTER — Other Ambulatory Visit: Payer: Self-pay

## 2020-03-29 VITALS — BP 90/60 | HR 65 | Wt 358.4 lb

## 2020-03-29 DIAGNOSIS — Z86711 Personal history of pulmonary embolism: Secondary | ICD-10-CM | POA: Insufficient documentation

## 2020-03-29 DIAGNOSIS — I472 Ventricular tachycardia: Secondary | ICD-10-CM | POA: Diagnosis not present

## 2020-03-29 DIAGNOSIS — I4892 Unspecified atrial flutter: Secondary | ICD-10-CM | POA: Insufficient documentation

## 2020-03-29 DIAGNOSIS — I5022 Chronic systolic (congestive) heart failure: Secondary | ICD-10-CM

## 2020-03-29 DIAGNOSIS — Z7901 Long term (current) use of anticoagulants: Secondary | ICD-10-CM | POA: Insufficient documentation

## 2020-03-29 DIAGNOSIS — Z79899 Other long term (current) drug therapy: Secondary | ICD-10-CM | POA: Insufficient documentation

## 2020-03-29 DIAGNOSIS — Z6841 Body Mass Index (BMI) 40.0 and over, adult: Secondary | ICD-10-CM | POA: Insufficient documentation

## 2020-03-29 DIAGNOSIS — I872 Venous insufficiency (chronic) (peripheral): Secondary | ICD-10-CM | POA: Diagnosis not present

## 2020-03-29 DIAGNOSIS — Z932 Ileostomy status: Secondary | ICD-10-CM | POA: Diagnosis not present

## 2020-03-29 DIAGNOSIS — I429 Cardiomyopathy, unspecified: Secondary | ICD-10-CM | POA: Diagnosis not present

## 2020-03-29 DIAGNOSIS — Z86718 Personal history of other venous thrombosis and embolism: Secondary | ICD-10-CM | POA: Insufficient documentation

## 2020-03-29 DIAGNOSIS — Z8719 Personal history of other diseases of the digestive system: Secondary | ICD-10-CM | POA: Diagnosis not present

## 2020-03-29 LAB — BASIC METABOLIC PANEL
Anion gap: 8 (ref 5–15)
BUN: 27 mg/dL — ABNORMAL HIGH (ref 6–20)
CO2: 27 mmol/L (ref 22–32)
Calcium: 9 mg/dL (ref 8.9–10.3)
Chloride: 101 mmol/L (ref 98–111)
Creatinine, Ser: 1.92 mg/dL — ABNORMAL HIGH (ref 0.61–1.24)
GFR, Estimated: 41 mL/min — ABNORMAL LOW (ref >60)
Glucose, Bld: 151 mg/dL — ABNORMAL HIGH (ref 70–99)
Potassium: 3.4 mmol/L — ABNORMAL LOW (ref 3.5–5.1)
Sodium: 136 mmol/L (ref 135–145)

## 2020-03-29 NOTE — Patient Instructions (Signed)
EKG done today.  Labs done today. We will contact you only if your labs are abnormal.  No medication changes were made. Please continue all current medications as prescribed.  Your physician recommends that you schedule a follow-up appointment in: 3 months  If you have any questions or concerns before your next appointment please send us a message through mychart or call our office at [phone].    TO LEAVE A MESSAGE FOR THE NURSE SELECT OPTION 2, PLEASE LEAVE A MESSAGE INCLUDING: . YOUR NAME . DATE OF BIRTH . CALL BACK NUMBER . REASON FOR CALL**this is important as we prioritize the call backs  YOU WILL RECEIVE A CALL BACK THE SAME DAY AS LONG AS YOU CALL BEFORE 4:00 PM   Do the following things EVERYDAY: 1) Weigh yourself in the morning before breakfast. Write it down and keep it in a log. 2) Take your medicines as prescribed 3) Eat low salt foods--Limit salt (sodium) to 2000 mg per day.  4) Stay as active as you can everyday 5) Limit all fluids for the day to less than 2 liters   At the Advanced Heart Failure Clinic, you and your health needs are our priority. As part of our continuing mission to provide you with exceptional heart care, we have created designated Provider Care Teams. These Care Teams include your primary Cardiologist (physician) and Advanced Practice Providers (APPs- Physician Assistants and Nurse Practitioners) who all work together to provide you with the care you need, when you need it.   You may see any of the following providers on your designated Care Team at your next follow up: . Dr Daniel Bensimhon . Dr Dalton McLean . Amy Clegg, NP . Brittainy Simmons, PA . Lauren Kemp, PharmD   Please be sure to bring in all your medications bottles to every appointment.   

## 2020-03-29 NOTE — Progress Notes (Signed)
Advanced Heart Failure Clinic Note   Patient ID: Roberto [name], male   DOB: Oct 11, 1965, 55 y.o.   MRN: [id number] PCP: Dr. Susann Givens Cardiologist: Dr. [name] EP: Dr. Johney Frame AHF: Dr. Gala Romney   HPI: Roberto [name] is a 55 y.o. 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    Admitted to [facility] 12/16/11 for recurrent HF. Cath with no CAD.   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 EF dropped from 40%-> 15%. Underwent successful DCCV. Started on amiodarone.  S/p AFL ablation 11/17/2016  Pt underwent outpatient DCCV 03/20/17, then came back that evening with worsening SOB and CP. Diuresed with IV lasix. Also treated for cellulitis of his RLE, and gout. Discharge weight 280 lbs.  Echo 7/20: EF 25-30%   Admitted 12/19/18- 01/24/19 for acute diverticulits. Failed IV abx. Underwent R colectomy with ileostomy.  In OR patient became hypotensive, required pressors. Course c/b perforated R colon with right lateral peritoneal fluid collection s/p right lateral abdominal drain placement in IR 01/04/2019. Post-op struggling with non-healing ab wound and underwent skin grafting on 09/05/19.  We saw him on 12/14/19, doing well volume overloaded. Instructed to take 1 dose of metolazone, He took 2 doses & developed hypokalemia. Had syncope with head trauma and ICD shock. ICD interrogation showed VT. He has had K and mag checked subsequently by [facility] and both ok. Follow up 2 weeks later fluid was better.   ileostomy reversal not done yet, needs further abdominal wall surgery due to hernia.   At last clinic visit, 03/08/20, he was fluid overloaded. Wt was and Optivol also suggested hypervolemia. Cleda Daub was increased to 25 mg daily and he was instructed to take 2.5 mg of metolazone x 1. Torsemide was continued at 20 mg bid.   He returns today for f/u. Feels better. Volume  status improved. Wt down 4 lb. Optivol reviewed. Index is down below threshold and impedence is back up and just crossed reference curve. No Afib. No VT/VF. He is not driving. His daughter drove him to his appt. Optivol is followed closely in device clinic.    EKG shows NSR. 1 PVC 70 bpm   Optivol. Volume status improved. Index is down impedence just crossed reference curve. No afib. No VT/VF.     Cardiac studies: Echo (12/21): EF 20-25%, RV ok, personally reviewed Echo (07/2017): EF appears slightly improved to 20-25%. Possible clot (vs artifact in IVC/RA junction) Personally reviewed Echo (10/15/16): LVEF 15% Echo (5/14): EF 40-45% Echo (12/13): EF 25-30%  TEE (03/20/17): EF 15-20%, Severe reduced RV function  RHC 10/20/16  RA = 3 RV = 45/7 PA = 49/20 (31) PCW = 28 Fick cardiac output/index = 5.0/2.1 PVR = 0.5 WU Ao sat = 95% PA sat = 64%, 61%  CARDIAC MRI - 12/18/11  1. Moderately dilated left ventricle with moderately decreased systolic function, EF 38%. Global hypokinesis.  2. Mildly dilated right ventricle with mild to moderately decreased systolic function.  3. No definite LV thrombus noted.  4. Patchy non-subendocardial delayed enhancement seen in the ventricular septum (see above for description). This is not suggestive of a coronary disease pattern. This could be suggestive of infiltrative disease versus prior myocarditis.  RHC/LHC 12/22/11  Cors: Normal  SH: Lives with his wife and 5 children. Works full time running a Daycare  Review of systems complete and found to be negative  unless listed in HPI.    Past Medical History:  Diagnosis Date  . AICD (automatic cardioverter/defibrillator) present 08/2018   Medtronic ICD  . Arthritis    knee  . Chronic combined systolic and diastolic CHF (congestive heart failure) (HCC)   . Coronary artery disease   . Deep vein thrombosis (HCC) 2007   left leg  . Diverticulitis   . Hepatitis    patient denies this dx  . Lupus  anticoagulant positive   . Morbid obesity (HCC)   . Morbid obesity with BMI of 50.0-59.9, adult (HCC)   . Mural thrombus of heart    coumadin  . Nonischemic cardiomyopathy (HCC)    a) 12/17/11 echo: LVEF 25-30%, grade 3 diastolic dysfunction (c/w restriction), mod MR, mod LA/LV and mild RA dilatation; b) 12/18/11 cMRI: LVEF 38%, mod LV/mild RV dilatation, global HK, mild-mod RV sys dysfxn, no LV thrombus & patchy non-subendocardial delayed enhancement c/w infil dz or prior myocarditis; c. 07/2018 EF 25-30%.  . Paroxysmal SVT (supraventricular tachycardia) (HCC)   . Pneumonia    x 2  . Pulmonary embolism (HCC)    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  . Wears glasses   . Wound of abdomen     Current Outpatient Medications  Medication Sig Dispense Refill  . amiodarone (PACERONE) 200 MG tablet Take 1 tablet (200 mg total) by mouth daily. 30 tablet 5  . apixaban (ELIQUIS) 5 MG TABS tablet Take 1 tablet (5 mg total) by mouth 2 (two) times daily. 60 tablet 5  . carvedilol (COREG) 3.125 MG tablet TAKE 1 TABLET(3.125 MG) BY MOUTH TWICE DAILY WITH A MEAL 180 tablet 3  . coconut oil OIL Apply 1 application topically daily as needed (dry skin).     [name] digoxin (LANOXIN) 0.125 MG tablet Take 1 tablet (0.125 mg total) by mouth daily. 30 tablet 3  . metolazone (ZAROXOLYN) 2.5 MG tablet Take 1 tablet (2.5 mg total) by mouth once for 1 dose. 1 tablet 0  . potassium chloride SA (KLOR-CON) 20 MEQ tablet Take 1 tablet (20 mEq total) by mouth daily. 30 tablet 3  . sacubitril-valsartan (ENTRESTO) 49-51 MG Take 1 tablet by mouth 2 (two) times daily. 60 tablet 4  . spironolactone (ALDACTONE) 25 MG tablet Take 1 tablet (25 mg total) by mouth daily. 30 tablet 3  . torsemide (DEMADEX) 20 MG tablet TAKE 1 TABLET BY MOUTH TWICE DAILY. MAY TAKE 1 EXTRA TABLET AS NEEDED 75 tablet 0   No current facility-administered medications for this encounter.   PHYSICAL EXAM: Vitals:   03/29/20 1407  BP: 90/60  Pulse: 65   SpO2: 95%  Weight: (!) 162.6 kg (358 lb 6.4 oz)   Wt Readings from Last 3 Encounters:  03/29/20 (!) 162.6 kg (358 lb 6.4 oz)  03/08/20 (!) 164.2 kg (362 lb)  02/10/20 (!) 153.8 kg (339 lb)     PHYSICAL EXAM: General:  Well appearing, morbidly obese. No respiratory difficulty HEENT: normal Neck: supple. Thick neck, JVD not well visualized. Carotids 2+ bilat; no bruits. No lymphadenopathy or thyromegaly appreciated. Cor: PMI nondisplaced. Regular rate & rhythm. No rubs, gallops or murmurs. Lungs: clear Abdomen: soft, nontender, nondistended. No hepatosplenomegaly. No bruits or masses. Good bowel sounds. Extremities: no cyanosis, clubbing, rash, edema Neuro: alert & oriented x 3, cranial nerves grossly intact. moves all 4 extremities w/o difficulty. Affect pleasant.   ICD interrogation: Volume status improved. Index is down impedence just crossed reference curve. No afib. No VT/VF. (personally  reviewed).  ECG:  NSR, 1 PVC, 70 bpm (personally reviewed).  ASSESSMENT & PLAN:  1. H/o VT - On 12/16/19 in setting of hypokalemia related to metolazone use. - no further VT on device interrogation today - Limit metoalzone use. Take extra 40 mEq of KCl with each dose. - No driving for 6 months (~4/17). - Check BMP today   2. Chronic Systolic Heart Failure: - Echo 10/15/16 - LVEF 15% - TEE 03/2017 LVEF 15-20% - Echo 07/2017 with LVEF 20-25% - Echo 7/20 EF 25-30% - Echo 12/21 EF 25-30% - Volume status improved w/ diuretic adjustment. Optivol c/w euvolemia - Continue torsemide 20 mg bid.  - Continue Spiro 25 mg daily  - Only take metolazone PRN as advised by [facility] (instructed to call if > 5 lb wt gain in 1 week) - Continue digoxin 0.125 mg daily. Check Dig level  - Continue carvedilol 3.125 bid.  - Continue Entresto to 49/51 mg bid. BP too soft for titration  - Would add [place] after ileostomy reversal. - discussed low sodium diet, fluid restriction and daily wts   3. Persistent AFL -  S/p Ablation 11/17/16. - Maintaining NSR on EKG today  - Continue amiodarone 200 mg. Check TFTs and HFTs next visit. Needs annual eye exams  - Continue Eliquis 5 bid. Denies abnormal bleeding   4. 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 S/p skin grafting 09/05/19. - Abdominal wound healed, plans for reversal of ileostomy after extensive abdominal hernia repair scheduled.  F/u in 2-3 months   Robbie Lis, PA-C -[facility] 03/29/20

## 2020-04-02 ENCOUNTER — Telehealth (HOSPITAL_COMMUNITY): Payer: Self-pay | Admitting: *Deleted

## 2020-04-02 DIAGNOSIS — I5022 Chronic systolic (congestive) heart failure: Secondary | ICD-10-CM

## 2020-04-02 MED ORDER — POTASSIUM CHLORIDE CRYS ER 20 MEQ PO TBCR: 30.0000 meq | EXTENDED_RELEASE_TABLET | Freq: Every day | ORAL | 3 refills | Status: DC

## 2020-04-02 MED ORDER — TORSEMIDE 20 MG PO TABS: ORAL_TABLET | ORAL | 0 refills | Status: DC

## 2020-04-02 NOTE — Telephone Encounter (Signed)
Modesta Messing, [place]  04/02/2020 2:02 PM EDT Back to Top     Pt returned call and is aware of results. Lab appt scheduled.    Theresia Bough, CMA  03/30/2020 3:48 PM EDT      [phone] (M) LMOM   Brittainy Sherlynn Carbon, PA-C  03/29/2020 6:02 PM EDT      Labs suggest dehydration and K is low. Hold torsemide x 1 day, then reduce dose to 20 qam/10 qpm. Increase KCl to 30 mEq daily. Repeat BMP in 1 week. Keep plans to do remote transmission for Optivol check that is scheduled later this month.

## 2020-04-02 NOTE — Telephone Encounter (Signed)
-----   Message from Allayne Butcher, [place] sent at 03/29/2020  6:02 PM EDT ----- Labs suggest dehydration and K is low. Hold torsemide x 1 day, then reduce dose to 20 qam/10 qpm. Increase KCl to 30 mEq daily. Repeat BMP in 1 week. Keep plans to do remote transmission for Optivol check that is scheduled later this month.

## 2020-04-04 ENCOUNTER — Other Ambulatory Visit: Payer: Self-pay | Admitting: Internal Medicine

## 2020-04-05 ENCOUNTER — Telehealth (HOSPITAL_COMMUNITY): Payer: Self-pay | Admitting: *Deleted

## 2020-04-05 NOTE — Telephone Encounter (Signed)
Pt called today c/o labored breathing when walking or moving around. Denies weight gain, chest pain, or edema. Pt said he had this issue before when he started carvedilol. Heart rate normal but pt said his diastolic bp has been running in the 60s which patient considered low.   Routed to [employer], NP-c for advice

## 2020-04-06 ENCOUNTER — Encounter (HOSPITAL_COMMUNITY): Payer: Self-pay | Admitting: *Deleted

## 2020-04-06 NOTE — Telephone Encounter (Signed)
  Optivol is not suggestive of fluid accumulation.   Continue current HF meds.   He may need to contact PCP. IF SOB worsens he should go to the ED for evaluation.    Jencarlo Bonadonna NP-C  12:18 PM

## 2020-04-06 NOTE — Telephone Encounter (Signed)
Please obtain device interrogation.  Needs to go the ED if breathing labored if not he please contact PCP. Does he have any other symptoms fever/chills?   Please call him.   THanks Tiny Rietz NP-C

## 2020-04-06 NOTE — Telephone Encounter (Signed)
No fever/chills.

## 2020-04-09 ENCOUNTER — Ambulatory Visit (INDEPENDENT_AMBULATORY_CARE_PROVIDER_SITE_OTHER): Payer: BC Managed Care – PPO

## 2020-04-09 ENCOUNTER — Telehealth: Payer: Self-pay

## 2020-04-09 DIAGNOSIS — I5022 Chronic systolic (congestive) heart failure: Secondary | ICD-10-CM | POA: Diagnosis not present

## 2020-04-09 DIAGNOSIS — Z95810 Presence of automatic (implantable) cardiac defibrillator: Secondary | ICD-10-CM | POA: Diagnosis not present

## 2020-04-09 NOTE — Progress Notes (Signed)
EPIC Encounter for ICM Monitoring  Patient Name: Roberto [name] is a 55 y.o. male Date: 04/09/2020 Primary Care Physican: [name], MD Primary Cardiologist:Bensimhon Electrophysiologist:Camnitz 1/28/2022OfficeWeight:339lbs   Time in AF0.0 hr/day (0.0%)  Attempted call to patient and unable to reach.  Transmission reviewed.  Torsemide on hold x 1 day and long term dosage decreased on 04/05/2020 due to labs showing possible dryness.  Optivol thoracic impedancesuggesting fluid accumulation starting 03/31/2020 with exception of one day 04/05/2020 close to baseline.  Impedance currently trending back toward baseline.  Prescribed:   Torsemide20 mgTake 1 tablet (20 mg total) by mouth every morning and 0.5 tablet (10 mg total) every evening. Take an extra tablet as needed  Potassium 20 mEq take 1.5 tablets (30 mEq total) daily.   Spironolactone 25 mg take 1 tablet daily  Labs: BMET scheduled 04/11/2020 03/29/2020 Creatinine 1.92, BUN 27, Potassium 3.4, Sodium 136, GFR 41 03/08/2020 Creatinine 1.60, BUN 18, Potassium 4.0, Sodium 13, GFR 51  01/16/2020 Creatinine 1.64, BUN 23, Potassium 4.0, Sodium 137 12/28/2019 Creatinine 1.48, BUN 17, Potassium 4.9, Sodium 138, GFR 56 12/14/2019 Creatinine1.57, BUN18, Potassium3.5, Sodium141, GFR 52 11/25/2019 Creatinine1.31, BUN20, Potassium3.7, Sodium139  09/13/2019 Creatinine1.39, BUN10, Potassium4.0, Sodium136, GFR>60 A complete set of results can be found in Results Review.  Recommendations:Unable to reach.    Follow-up plan: ICM clinic phone appointment on4/11/2020 to recheck fluid levels. 91 day device clinic remote transmission4/15/2022.   EP/Cardiology Office Visits:07/02/2020 with Dr Gala Romney   Copy of ICM check sent to [facility]  3 month ICM trend: 04/09/2020.    1 Year ICM trend:       Karie Soda,  RN 04/09/2020 4:13 PM

## 2020-04-09 NOTE — Telephone Encounter (Signed)
Remote ICM transmission received.  Attempted call to patient regarding ICM remote transmission and call disconnected once it was picked up.

## 2020-04-11 ENCOUNTER — Other Ambulatory Visit: Payer: Self-pay

## 2020-04-11 ENCOUNTER — Ambulatory Visit (HOSPITAL_COMMUNITY)
Admission: RE | Admit: 2020-04-11 | Discharge: 2020-04-11 | Disposition: A | Discharge status: Home / Self Care | Payer: BC Managed Care – PPO | Source: Ambulatory Visit | Attending: Cardiology | Admitting: Cardiology

## 2020-04-11 DIAGNOSIS — I5022 Chronic systolic (congestive) heart failure: Secondary | ICD-10-CM | POA: Insufficient documentation

## 2020-04-11 LAB — BASIC METABOLIC PANEL
Anion gap: 9 (ref 5–15)
BUN: 20 mg/dL (ref 6–20)
CO2: 26 mmol/L (ref 22–32)
Calcium: 8.8 mg/dL — ABNORMAL LOW (ref 8.9–10.3)
Chloride: 103 mmol/L (ref 98–111)
Creatinine, Ser: 1.94 mg/dL — ABNORMAL HIGH (ref 0.61–1.24)
GFR, Estimated: 40 mL/min — ABNORMAL LOW (ref >60)
Glucose, Bld: 172 mg/dL — ABNORMAL HIGH (ref 70–99)
Potassium: 3.7 mmol/L (ref 3.5–5.1)
Sodium: 138 mmol/L (ref 135–145)

## 2020-04-13 ENCOUNTER — Emergency Department (HOSPITAL_COMMUNITY): Payer: BC Managed Care – PPO

## 2020-04-13 ENCOUNTER — Inpatient Hospital Stay (HOSPITAL_COMMUNITY)
Admission: EM | Admit: 2020-04-13 | Discharge: 2020-04-19 | DRG: 286 | Disposition: A | Discharge status: Home / Self Care | Payer: BC Managed Care – PPO | Attending: Internal Medicine | Admitting: Internal Medicine

## 2020-04-13 ENCOUNTER — Encounter (HOSPITAL_COMMUNITY): Payer: Self-pay

## 2020-04-13 ENCOUNTER — Other Ambulatory Visit: Payer: Self-pay

## 2020-04-13 DIAGNOSIS — I471 Supraventricular tachycardia, unspecified: Secondary | ICD-10-CM | POA: Diagnosis present

## 2020-04-13 DIAGNOSIS — Z7901 Long term (current) use of anticoagulants: Secondary | ICD-10-CM

## 2020-04-13 DIAGNOSIS — Z8249 Family history of ischemic heart disease and other diseases of the circulatory system: Secondary | ICD-10-CM

## 2020-04-13 DIAGNOSIS — I130 Hypertensive heart and chronic kidney disease with heart failure and stage 1 through stage 4 chronic kidney disease, or unspecified chronic kidney disease: Secondary | ICD-10-CM | POA: Diagnosis present

## 2020-04-13 DIAGNOSIS — R760 Raised antibody titer: Secondary | ICD-10-CM | POA: Diagnosis present

## 2020-04-13 DIAGNOSIS — Z832 Family history of diseases of the blood and blood-forming organs and certain disorders involving the immune mechanism: Secondary | ICD-10-CM | POA: Diagnosis not present

## 2020-04-13 DIAGNOSIS — Z96651 Presence of right artificial knee joint: Secondary | ICD-10-CM | POA: Diagnosis present

## 2020-04-13 DIAGNOSIS — Z6841 Body Mass Index (BMI) 40.0 and over, adult: Secondary | ICD-10-CM

## 2020-04-13 DIAGNOSIS — I5022 Chronic systolic (congestive) heart failure: Secondary | ICD-10-CM | POA: Diagnosis not present

## 2020-04-13 DIAGNOSIS — N179 Acute kidney failure, unspecified: Secondary | ICD-10-CM | POA: Diagnosis present

## 2020-04-13 DIAGNOSIS — E871 Hypo-osmolality and hyponatremia: Secondary | ICD-10-CM | POA: Diagnosis not present

## 2020-04-13 DIAGNOSIS — I483 Typical atrial flutter: Secondary | ICD-10-CM | POA: Diagnosis present

## 2020-04-13 DIAGNOSIS — Z86718 Personal history of other venous thrombosis and embolism: Secondary | ICD-10-CM | POA: Diagnosis not present

## 2020-04-13 DIAGNOSIS — I509 Heart failure, unspecified: Secondary | ICD-10-CM

## 2020-04-13 DIAGNOSIS — I428 Other cardiomyopathies: Secondary | ICD-10-CM | POA: Diagnosis present

## 2020-04-13 DIAGNOSIS — D6862 Lupus anticoagulant syndrome: Secondary | ICD-10-CM | POA: Diagnosis present

## 2020-04-13 DIAGNOSIS — I5043 Acute on chronic combined systolic (congestive) and diastolic (congestive) heart failure: Secondary | ICD-10-CM | POA: Diagnosis present

## 2020-04-13 DIAGNOSIS — I2510 Atherosclerotic heart disease of native coronary artery without angina pectoris: Secondary | ICD-10-CM | POA: Diagnosis present

## 2020-04-13 DIAGNOSIS — E860 Dehydration: Secondary | ICD-10-CM | POA: Diagnosis present

## 2020-04-13 DIAGNOSIS — Z86711 Personal history of pulmonary embolism: Secondary | ICD-10-CM | POA: Diagnosis not present

## 2020-04-13 DIAGNOSIS — Z833 Family history of diabetes mellitus: Secondary | ICD-10-CM

## 2020-04-13 DIAGNOSIS — Z95810 Presence of automatic (implantable) cardiac defibrillator: Secondary | ICD-10-CM | POA: Diagnosis present

## 2020-04-13 DIAGNOSIS — Z79899 Other long term (current) drug therapy: Secondary | ICD-10-CM | POA: Diagnosis not present

## 2020-04-13 DIAGNOSIS — Z808 Family history of malignant neoplasm of other organs or systems: Secondary | ICD-10-CM | POA: Diagnosis not present

## 2020-04-13 DIAGNOSIS — I480 Paroxysmal atrial fibrillation: Secondary | ICD-10-CM | POA: Diagnosis present

## 2020-04-13 DIAGNOSIS — N1831 Chronic kidney disease, stage 3a: Secondary | ICD-10-CM | POA: Diagnosis present

## 2020-04-13 DIAGNOSIS — I240 Acute coronary thrombosis not resulting in myocardial infarction: Secondary | ICD-10-CM | POA: Diagnosis present

## 2020-04-13 DIAGNOSIS — E876 Hypokalemia: Secondary | ICD-10-CM | POA: Diagnosis not present

## 2020-04-13 DIAGNOSIS — N189 Chronic kidney disease, unspecified: Secondary | ICD-10-CM

## 2020-04-13 DIAGNOSIS — Z20822 Contact with and (suspected) exposure to covid-19: Secondary | ICD-10-CM | POA: Diagnosis present

## 2020-04-13 DIAGNOSIS — N183 Chronic kidney disease, stage 3 unspecified: Secondary | ICD-10-CM | POA: Diagnosis not present

## 2020-04-13 DIAGNOSIS — R0602 Shortness of breath: Secondary | ICD-10-CM | POA: Diagnosis present

## 2020-04-13 DIAGNOSIS — Z91013 Allergy to seafood: Secondary | ICD-10-CM

## 2020-04-13 LAB — CBC
HCT: 47.7 % (ref 39.0–52.0)
Hemoglobin: 15.6 g/dL (ref 13.0–17.0)
MCH: 30.2 pg (ref 26.0–34.0)
MCHC: 32.7 g/dL (ref 30.0–36.0)
MCV: 92.3 fL (ref 80.0–100.0)
Platelets: 164 10*3/uL (ref 150–400)
RBC: 5.17 MIL/uL (ref 4.22–5.81)
RDW: 13.7 % (ref 11.5–15.5)
WBC: 9.3 10*3/uL (ref 4.0–10.5)
nRBC: 0 % (ref 0.0–0.2)

## 2020-04-13 LAB — TROPONIN I (HIGH SENSITIVITY)
Troponin I (High Sensitivity): 36 ng/L — ABNORMAL HIGH (ref <18)
Troponin I (High Sensitivity): 37 ng/L — ABNORMAL HIGH (ref <18)

## 2020-04-13 LAB — BASIC METABOLIC PANEL
Anion gap: 10 (ref 5–15)
BUN: 25 mg/dL — ABNORMAL HIGH (ref 6–20)
CO2: 24 mmol/L (ref 22–32)
Calcium: 9.1 mg/dL (ref 8.9–10.3)
Chloride: 101 mmol/L (ref 98–111)
Creatinine, Ser: 2.12 mg/dL — ABNORMAL HIGH (ref 0.61–1.24)
GFR, Estimated: 36 mL/min — ABNORMAL LOW (ref >60)
Glucose, Bld: 127 mg/dL — ABNORMAL HIGH (ref 70–99)
Potassium: 3.5 mmol/L (ref 3.5–5.1)
Sodium: 135 mmol/L (ref 135–145)

## 2020-04-13 LAB — BRAIN NATRIURETIC PEPTIDE: B Natriuretic Peptide: 917 pg/mL — ABNORMAL HIGH (ref 0.0–100.0)

## 2020-04-13 LAB — SARS CORONAVIRUS 2 (TAT 6-24 HRS): SARS Coronavirus 2: NEGATIVE

## 2020-04-13 MED ORDER — CARVEDILOL 3.125 MG PO TABS
3.1250 mg | ORAL_TABLET | Freq: Two times a day (BID) | ORAL | Status: DC
  Administered 2020-04-13 – 2020-04-19 (×10): 3.125 mg via ORAL
  Filled 2020-04-13 (×13): qty 1

## 2020-04-13 MED ORDER — SODIUM CHLORIDE 0.9% FLUSH
3.0000 mL | Freq: Two times a day (BID) | INTRAVENOUS | Status: DC
  Administered 2020-04-13 – 2020-04-16 (×6): 3 mL via INTRAVENOUS

## 2020-04-13 MED ORDER — DIGOXIN 125 MCG PO TABS: 0.1250 mg | ORAL_TABLET | Freq: Every day | ORAL | Status: DC

## 2020-04-13 MED ORDER — POTASSIUM CHLORIDE CRYS ER 20 MEQ PO TBCR: 30.0000 meq | EXTENDED_RELEASE_TABLET | Freq: Every day | ORAL | Status: DC

## 2020-04-13 MED ORDER — APIXABAN 5 MG PO TABS
5.0000 mg | ORAL_TABLET | Freq: Two times a day (BID) | ORAL | Status: DC
  Administered 2020-04-13 – 2020-04-16 (×6): 5 mg via ORAL
  Filled 2020-04-13 (×6): qty 1

## 2020-04-13 MED ORDER — SODIUM CHLORIDE 0.9% FLUSH
3.0000 mL | INTRAVENOUS | Status: DC | PRN
Start: 2020-04-13 — End: 2020-04-19

## 2020-04-13 MED ORDER — FUROSEMIDE 10 MG/ML IJ SOLN
20.0000 mg | Freq: Two times a day (BID) | INTRAMUSCULAR | Status: DC
  Administered 2020-04-13: 20 mg via INTRAVENOUS
  Filled 2020-04-13: qty 2

## 2020-04-13 MED ORDER — FUROSEMIDE 10 MG/ML IJ SOLN
40.0000 mg | Freq: Once | INTRAMUSCULAR | Status: AC
  Administered 2020-04-13: 40 mg via INTRAVENOUS
  Filled 2020-04-13: qty 4

## 2020-04-13 MED ORDER — ACETAMINOPHEN 325 MG PO TABS: 650.0000 mg | ORAL_TABLET | ORAL | Status: DC | PRN

## 2020-04-13 MED ORDER — FUROSEMIDE 10 MG/ML IJ SOLN
80.0000 mg | Freq: Two times a day (BID) | INTRAMUSCULAR | Status: DC
  Administered 2020-04-14: 80 mg via INTRAVENOUS
  Filled 2020-04-13: qty 8

## 2020-04-13 MED ORDER — AMIODARONE HCL 200 MG PO TABS
200.0000 mg | ORAL_TABLET | Freq: Every day | ORAL | Status: DC
  Administered 2020-04-14 – 2020-04-19 (×6): 200 mg via ORAL
  Filled 2020-04-13 (×6): qty 1

## 2020-04-13 MED ORDER — ONDANSETRON HCL 4 MG/2ML IJ SOLN: 4.0000 mg | Freq: Four times a day (QID) | INTRAMUSCULAR | Status: DC | PRN

## 2020-04-13 MED ORDER — POTASSIUM CHLORIDE CRYS ER 20 MEQ PO TBCR
40.0000 meq | EXTENDED_RELEASE_TABLET | Freq: Once | ORAL | Status: AC
  Administered 2020-04-13: 40 meq via ORAL
  Filled 2020-04-13: qty 2

## 2020-04-13 MED ORDER — SODIUM CHLORIDE 0.9 % IV SOLN: 250.0000 mL | INTRAVENOUS | Status: DC | PRN

## 2020-04-13 NOTE — Consult Note (Addendum)
Advanced Heart Failure Team Consult Note   Primary Physician: [name], MD PCP-Cardiologist:  Arvilla Meres, MD  Reason for Consultation: acute on chronic systolic heart failure  HPI:    Roberto [name] is seen today for evaluation of acute on chronic systolic heart failure at the request of Dr. Katrinka Blazing, Internal Medicine.   Roberto [name] is a 55 y.o. 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     Admitted to [facility] 12/16/11 for recurrent HF. Cath with no CAD.   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 EF dropped from 40%-> 15%. Underwent successful DCCV. Started on amiodarone.  S/p AFL ablation 11/17/2016  Pt underwent outpatient DCCV 03/20/17, then came back that evening with worsening SOB and CP. Diuresed with IV lasix. Also treated for cellulitis of his RLE, and gout. Discharge weight 280 lbs.  Echo 7/20: EF 25-30%   Admitted 12/19/18- 01/24/19 for acute diverticulits. Failed IV abx. Underwent R colectomy with ileostomy.  In OR patient became hypotensive, required pressors. Course c/b perforated R colon with right lateral peritoneal fluid collection s/p right lateral abdominal drain placement in IR 01/04/2019. Post-op struggling with non-healing ab wound and underwent skin grafting on 09/05/19.  We saw him on 12/14/19, doing well but was volume overloaded. Instructed to take 1 dose of metolazone, He took 2 doses & developed hypokalemia. Had syncope with head trauma and ICD shock. ICD interrogation showed VT. He has had K and Mg checked subsequently by [facility] and both ok. Follow up 2 weeks later fluid was better.   Ileostomy reversal not done yet, needs further abdominal wall surgery due to hernia.   Had recent clinic visit 03/08/20 and he was fluid overloaded. Wt was up and Optivol also suggested hypervolemia. Cleda Daub was increased  to 25 mg daily and he was instructed to take 2.5 mg of metolazone x 1. Torsemide was continued at 20 mg bid.   Had return f/u again on 3/17 and was fleeing better. Volume status had improved. Wt was down 4 lb. Optivol reviewed. Index was down below threshold and impedence was back up and had just crossed reference curve. No Afib. No VT/VF. BMP showed AKI and suggestive of mild dehydration. SCr was up from 1.6>>1.9 and BUN from 18>>27. K was low at 3.4. He was advised to hold torsemide x 1 day, then reduce dose to 20 qam/10 qpm and increase KCl to 30 mEq daily w/ plans to repeat BMP and remote device interrogation in 1 week to reassess volume status. Since diuretic change, he has developed fluid overload. Had remote device interrogation on 3/24 c/w fluid accumulation. Unfortunately, symptoms progressed, prompting ED evaluation today.   In ED, BNP is up to 917 (baseline ~300 range). SCr elevated, 1.94>>2.12. BUN 25. K 3.5  EKG NSR 79 bpm w/ PVC. HS trop 36>>37. CXR w/ cardiomegaly and pulmonary vascular congestion. He is being admitted by IM. Has been started on IV Lasix 20 mg bid. AHF team consulted to assist.   He reports good urinary response thus far to IV Lasix. No resting dyspnea. Denies CP.   Echo 12/2019  1. Left ventricular ejection fraction, by estimation, is 25 to 30%. The left ventricle has severely decreased function. Left ventricular endocardial border not optimally defined to evaluate regional wall motion. The left ventricular internal cavity size was moderately dilated. There is  mild left ventricular hypertrophy. Left ventricular diastolic parameters are consistent with Grade III diastolic dysfunction (restrictive). 2. Right ventricular systolic function is normal. The right ventricular size is normal. 3. The mitral valve is normal in structure. No evidence of mitral valve regurgitation. No evidence of mitral stenosis. 4. The aortic valve is normal in structure. Aortic valve  regurgitation is not visualized. No aortic stenosis is present. 5. The inferior vena cava is normal in size with greater than 50% respiratory variability, suggesting right atrial pressure of 3 mmHg.  Review of Systems: [y] = yes, [ ]  = no   . General: Weight gain [ Y]; Weight loss [ ] ; Anorexia [ ] ; Fatigue [ ] ; Fever [ ] ; Chills [ ] ; Weakness [ ]   . Cardiac: Chest pain/pressure [ ] ; Resting SOB [ ] ; Exertional SOB [ Y]; Orthopnea [ ] ; Pedal Edema [ ] ; Palpitations [ ] ; Syncope [ ] ; Presyncope [ ] ; Paroxysmal nocturnal dyspnea[ ]   . Pulmonary: Cough [ ] ; Wheezing[ ] ; Hemoptysis[ ] ; Sputum [ ] ; Snoring [ ]   . GI: Vomiting[ ] ; Dysphagia[ ] ; Melena[ ] ; Hematochezia [ ] ; Heartburn[ ] ; Abdominal pain [ ] ; Constipation [ ] ; Diarrhea [ ] ; BRBPR [ ]   . GU: Hematuria[ ] ; Dysuria [ ] ; Nocturia[ ]   . Vascular: Pain in legs with walking [ ] ; Pain in feet with lying flat [ ] ; Non-healing sores [ ] ; Stroke [ ] ; TIA [ ] ; Slurred speech [ ] ;  . Neuro: Headaches[ ] ; Vertigo[ ] ; Seizures[ ] ; Paresthesias[ ] ;Blurred vision [ ] ; Diplopia [ ] ; Vision changes [ ]   . Ortho/Skin: Arthritis [ ] ; Joint pain [ ] ; Muscle pain [ ] ; Joint swelling [ ] ; Back Pain [ ] ; Rash [ ]   . Psych: Depression[ ] ; Anxiety[ ]   . Heme: Bleeding problems [ ] ; Clotting disorders [ ] ; Anemia [ ]   . Endocrine: Diabetes [ ] ; Thyroid dysfunction[ ]   Home Medications Prior to Admission medications   Medication Sig Start Date End Date Taking? Authorizing Provider  amiodarone (PACERONE) 200 MG tablet Take 1 tablet (200 mg total) by mouth daily. 11/23/19  Yes Bensimhon, , MD  carvedilol (COREG) 3.125 MG tablet TAKE 1 TABLET(3.125 MG) BY MOUTH TWICE DAILY WITH A MEAL Patient taking differently: Take 3.125 mg by mouth 2 (two) times daily with a meal. 02/06/20  Yes Bensimhon, , MD  coconut oil OIL Apply 1 application topically daily as needed (dry skin).    Yes [provider]  digoxin (LANOXIN) 0.125 MG tablet Take 1 tablet  (0.125 mg total) by mouth daily. 10/19/19  Yes Bensimhon, , MD  ELIQUIS 5 MG TABS tablet TAKE 1 TABLET(5 MG) BY MOUTH TWICE DAILY Patient taking differently: Take 5 mg by mouth 2 (two) times daily. 04/04/20  Yes M, PA-C  metolazone (ZAROXOLYN) 2.5 MG tablet Take 1 tablet (2.5 mg total) by mouth once for 1 dose. Patient taking differently: Take 2.5 mg by mouth once a week. 03/08/20 03/08/20 Yes Milford, , FNP  potassium chloride SA (KLOR-CON) 20 MEQ tablet Take 1.5 tablets (30 mEq total) by mouth daily. 04/02/20  Yes Simmons, Brittainy M, PA-C  sacubitril-valsartan (ENTRESTO) 49-51 MG Take 1 tablet by mouth 2 (two) times daily. 12/14/19  Yes Bensimhon, , MD  spironolactone (ALDACTONE) 25 MG tablet Take 1 tablet (25 mg total) by mouth daily. 03/08/20  Yes Milford, , FNP  torsemide (DEMADEX) 20 MG tablet Take 1 tablet (20 mg total) by mouth in the morning AND  0.5 tablets (10 mg total) every evening. 04/02/20  Yes Allayne Butcher, PA-C    Past Medical History: Past Medical History:  Diagnosis Date  . AICD (automatic cardioverter/defibrillator) present 08/2018   Medtronic ICD  . Arthritis    knee  . Chronic combined systolic and diastolic CHF (congestive heart failure) (HCC)   . Coronary artery disease   . Deep vein thrombosis (HCC) 2007   left leg  . Diverticulitis   . Hepatitis    patient denies this dx  . Lupus anticoagulant positive   . Morbid obesity (HCC)   . Morbid obesity with BMI of 50.0-59.9, adult (HCC)   . Mural thrombus of heart    coumadin  . Nonischemic cardiomyopathy (HCC)    a) 12/17/11 echo: LVEF 25-30%, grade 3 diastolic dysfunction (c/w restriction), mod MR, mod LA/LV and mild RA dilatation; b) 12/18/11 cMRI: LVEF 38%, mod LV/mild RV dilatation, global HK, mild-mod RV sys dysfxn, no LV thrombus & patchy non-subendocardial delayed enhancement c/w infil dz or prior myocarditis; c. 07/2018 EF 25-30%.  . Paroxysmal SVT  (supraventricular tachycardia) (HCC)   . Pneumonia    x 2  . Pulmonary embolism (HCC)    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  . Wears glasses   . Wound of abdomen     Past Surgical History: Past Surgical History:  Procedure Laterality Date  . A-FLUTTER ABLATION N/A 11/17/2016   Procedure: A-FLUTTER ABLATION;  Surgeon: Regan Lemming, MD;  Location: [facility] INVASIVE CV LAB;  Service: Cardiovascular;  Laterality: N/A;  . APPLICATION OF WOUND VAC N/A 09/05/2019   Procedure: APPLICATION OF WOUND VAC;  Surgeon: Allena Napoleon, MD;  Location: [facility] OR;  Service: Plastics;  Laterality: N/A;  PATIENT'S HOME WOUND VAC APPLIED.  [name] BIOPSY  12/23/2018   Procedure: BIOPSY;  Surgeon: Meridee Score Netty Starring., MD;  Location: [facility] ENDOSCOPY;  Service: Gastroenterology;;  . BIOPSY  01/23/2020   Procedure: BIOPSY;  Surgeon: Lemar Lofty., MD;  Location: [facility] ENDOSCOPY;  Service: Gastroenterology;;  . CARDIAC CATHETERIZATION  06/2006   Angiographically normal cors  . CARDIAC CATHETERIZATION  12/22/2011   R/LHC: normal cors, well-compensated HDs, LV dysfxn  . CARDIOVERSION N/A 10/17/2016   Procedure: CARDIOVERSION;  Surgeon: Dolores Patty, MD;  Location: [facility] ENDOSCOPY;  Service: Cardiovascular;  Laterality: N/A;  . CARDIOVERSION N/A 03/20/2017   Procedure: CARDIOVERSION;  Surgeon: Dolores Patty, MD;  Location: [facility] ENDOSCOPY;  Service: Cardiovascular;  Laterality: N/A;  . COLECTOMY N/A 12/28/2018   Procedure: TOTAL COLECTOMY;  Surgeon: Emelia Loron, MD;  Location: [facility] OR;  Service: General;  Laterality: N/A;  . FLEXIBLE SIGMOIDOSCOPY N/A 12/23/2018   Procedure: FLEXIBLE SIGMOIDOSCOPY;  Surgeon: Lemar Lofty., MD;  Location: [facility] ENDOSCOPY;  Service: Gastroenterology;  Laterality: N/A;  . FLEXIBLE SIGMOIDOSCOPY N/A 01/23/2020   Procedure: FLEXIBLE SIGMOIDOSCOPY;  Surgeon: Lemar Lofty., MD;  Location: [facility] ENDOSCOPY;  Service: Gastroenterology;  Laterality: N/A;  . ICD  IMPLANT N/A 09/03/2018   Procedure: ICD Medtronic IMPLANT;  Surgeon: Regan Lemming, MD;  Location: [facility] INVASIVE CV LAB;  Service: Cardiovascular;  Laterality: N/A;  . ILEOSTOMY N/A 12/28/2018   Procedure: ILEOSTOMY;  Surgeon: Emelia Loron, MD;  Location: [facility] OR;  Service: General;  Laterality: N/A;  . IR RADIOLOGIST EVAL & MGMT  02/08/2019  . JOINT REPLACEMENT     right knee  . LEFT AND RIGHT HEART CATHETERIZATION WITH CORONARY ANGIOGRAM N/A 12/22/2011   Procedure: LEFT AND RIGHT HEART CATHETERIZATION WITH CORONARY ANGIOGRAM;  Surgeon: Dolores Patty, MD;  Location: [facility] CATH LAB;  Service: Cardiovascular;  Laterality: N/A;  . PATELLAR TENDON REPAIR     Left  . RIGHT HEART CATH N/A 10/20/2016   Procedure: RIGHT HEART CATH;  Surgeon: Dolores Patty, MD;  Location: [facility] INVASIVE CV LAB;  Service: Cardiovascular;  Laterality: N/A;  . SKIN SPLIT GRAFT N/A 09/05/2019   Procedure: SKIN GRAFT SPLIT THICKNESS;  Surgeon: Allena Napoleon, MD;  Location: [facility] OR;  Service: Plastics;  Laterality: N/A;  . TEE WITHOUT CARDIOVERSION N/A 03/20/2017   Procedure: TRANSESOPHAGEAL ECHOCARDIOGRAM (TEE);  Surgeon: Dolores Patty, MD;  Location: [facility] ENDOSCOPY;  Service: Cardiovascular;  Laterality: N/A;    Family History: Family History  Problem Relation Age of Onset  . Hypertension Mother   . Clotting disorder Mother        mom with PEs  . Cancer Mother        uterine cancer  . Diabetes Father   . Heart attack Father   . Hypertension Other        Sibling  . Diabetes Other   . Obesity Other   . Diabetes Sister   . Obesity Sister   . Obesity Sister   . Colon cancer Neg Hx   . Stomach cancer Neg Hx   . Esophageal cancer Neg Hx   . Rectal cancer Neg Hx   . Inflammatory bowel disease Neg Hx   . Liver disease Neg Hx   . Pancreatic cancer Neg Hx     Social History: Social History   Socioeconomic History  . Marital status: Divorced    Spouse name: Not on file  . Number of children: 5   . Years of education: Not on file  . Highest education level: Not on file  Occupational History  . 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 Smoking status: Never Smoker  . Smokeless tobacco: Never Used  Vaping Use  . Vaping Use: Never used  Substance and Sexual Activity  . Alcohol use: Not Currently    Comment: Rarely  . Drug use: No  . Sexual activity: Not on file  Other Topics Concern  . Not on file  Social History Narrative   Lives in [place] 03/2015, 5 children.   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  Exercises with weights and some walking. 12/2015   Social Determinants of Health   Financial Resource Strain: Not on file  Food Insecurity: Not on file  Transportation Needs: Not on file  Physical Activity: Not on file  Stress: Not on file  Social Connections: Not on file    Allergies:  Allergies  Allergen Reactions  . Shrimp [Shellfish Allergy] Hives and Itching    Objective:    Vital Signs:   Temp:  [97.7 F (36.5 C)-99 F (37.2 C)] 97.7 F (36.5 C) (04/01 1429) Pulse Rate:  [77-84] 77 (04/01 1429) Resp:  [15-20] 18 (04/01 1429) BP: (98-130)/(65-82) 102/82 (04/01 1429) SpO2:  [95 %-98 %] 98 % (04/01 1429)    Weight change: There were no vitals filed for this visit.  Intake/Output:   Intake/Output Summary (Last 24 hours) at 04/13/2020 1518 Last data filed at 04/13/2020 1401 Gross per 24 hour  Intake --  Output 350 ml  Net -350 ml      Physical Exam    General:  Well appearing, super morbidly  obese AAM. No resp difficulty HEENT: normal Neck: supple. Thick neck JVP difficult to assess . Carotids 2+ bilat; no bruits. No lymphadenopathy or thyromegaly appreciated. Cor: PMI nondisplaced. Regular rate & rhythm. No rubs, gallops or murmurs. Lungs: clear Abdomen: obese, + rt sided ileostomy soft, nontender, nondistended. No hepatosplenomegaly. No  bruits or masses. Good bowel sounds. Extremities: no cyanosis, clubbing, rash, trace- 1+ bilateral LE edema, R>L  Neuro: alert & orientedx3, cranial nerves grossly intact. moves all 4 extremities w/o difficulty. Affect pleasant   Telemetry   NSR 70s   EKG    NSR 79 bpm w/ PVC   Labs   Basic Metabolic Panel: Recent Labs  Lab 04/11/20 0824 04/13/20 1126  NA 138 135  K 3.7 3.5  CL 103 101  CO2 26 24  GLUCOSE 172* 127*  BUN 20 25*  CREATININE 1.94* 2.12*  CALCIUM 8.8* 9.1    Liver Function Tests: No results for input(s): AST, ALT, ALKPHOS, BILITOT, PROT, ALBUMIN in the last 168 hours. No results for input(s): LIPASE, AMYLASE in the last 168 hours. No results for input(s): AMMONIA in the last 168 hours.  CBC: Recent Labs  Lab 04/13/20 1126  WBC 9.3  HGB 15.6  HCT 47.7  MCV 92.3  PLT 164    Cardiac Enzymes: No results for input(s): CKTOTAL, CKMB, CKMBINDEX, TROPONINI in the last 168 hours.  BNP: BNP (last 3 results) Recent Labs    09/13/19 0952 04/13/20 1126  BNP 348.4* 917.0*    ProBNP (last 3 results) No results for input(s): PROBNP in the last 8760 hours.   CBG: No results for input(s): GLUCAP in the last 168 hours.  Coagulation Studies: No results for input(s): LABPROT, INR in the last 72 hours.   Imaging   DG Chest 2 View  Result Date: 04/13/2020 CLINICAL DATA:  Shortness of breath. EXAM: CHEST - 2 VIEW COMPARISON:  12/16/2019 FINDINGS: A single lead ICD remains in place. The cardiac silhouette remains mildly enlarged. Pulmonary vascular congestion is similar to the prior study. No confluent airspace opacity, pleural effusion, pneumothorax is identified. No acute osseous abnormality is seen. IMPRESSION: Cardiomegaly and pulmonary vascular congestion. Electronically Signed   By: Sebastian AcheAllen  Grady M.D.   On: 04/13/2020 11:48      Medications:     Current Medications: . apixaban  5 mg Oral BID  . furosemide  20 mg Intravenous BID  . sodium  chloride flush  3 mL Intravenous Q12H     Infusions: . sodium chloride         Assessment/Plan   1. Acute on Chronic Systolic Heart Failure - Echo 40/09/8108/3/18 - LVEF 15% - TEE 03/2017 LVEF 15-20% - Echo 07/2017 with LVEF 20-25% - Echo 7/20 EF 25-30% - Echo 12/21 EF 25-30% - Now admitted for a/c CHF, NYHA Class III symptoms, w/ volume overload in the setting of recent diuretic dose reduction in the setting of worsening renal function.  - Plan diuresis w/ IV Lasix. If difficulties diuresing and/ or if worsening renal function w/ attempt at diuresis, will need RHC early next week.  - hold Entresto/spiro and digoxin for now w/ rising SCr and soft BP  - no SGLT2i w/ ileostomy w/ risk of GU/peritoneal infection    2.  AKI on Stage III CKD - prior baseline SCr ~1.6 - recent increase to 1.9>>2.1  - now w/ acute fluid overload w/ CHF - will diurese w/ IV Lasix. If SCr does not improve or worsens may need RHC to  assess filling pressures and cardiac output  - hold Entresto/spiro/digoxin for now   3. H/o VT - had VT 12/21 in setting of hypokalemia related to metolazone use - no further VT has been detected on most recent device interrogation  - keep K > 4.0 and Mg >2.0  - No driving for 6 months (~1/61).   4. H/o Atrial Flutter s/p AFL Ablation  - Maintaining NSR on EKG today  - Continue amiodarone 200 mg.  - Continue Eliquis 5 bid.  5. 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 S/p skin grafting 09/05/19. - Abdominal wound healed, plans for reversal of ileostomy after extensive abdominal hernia repair    Length of Stay: 0  Brittainy Simmons, PA-C  04/13/2020, 3:18 PM  Advanced Heart Failure Team Pager [phone] (M-F; 7a - 5p)  Please contact [facility] for night-coverage after hours (4p -7a ) and weekends on [email or web address]  Patient seen with PA, agree with the above note.   Patient with nonischemic cardiomyopathy, echo in 12/21 with EF  25-30%.  Complicated by CKD stage 3/cardiorenal syndrome.  Recently decreased home torsemide dose due to rising creatinine and "dryness" by Optivol interrogation.  Now admitted with progressive dyspnea.    General: NAD, obese Neck: JVP about 12 cm, no thyromegaly or thyroid nodule.  Lungs: Clear to auscultation bilaterally with normal respiratory effort. CV: Nonpalpable PMI.  Heart regular S1/S2, no S3/S4, no murmur.  1+ ankle edema.  Abdomen: Soft, nontender, no hepatosplenomegaly, no distention.  Skin: Intact without lesions or rashes.  Neurologic: Alert and oriented x 3.  Psych: Normal affect. Extremities: No clubbing or cyanosis.  HEENT: Normal.   He does appear volume overloaded on exam.  BP is relatively soft.   - Will diurese with Lasix 80 mg IV bid for now.  He got 60 mg IV Lasix total this afternoon, start tomorrow.  Follow creatinine closely with diuresis.  - For now, hold Entresto and spironolactone with elevated creatinine/soft BP.  - Continue Coreg and digoxin for now, check digoxin level in am.   He remains in NSR on amiodarone and apixaban.   Marca Ancona 04/13/2020 6:47 PM

## 2020-04-13 NOTE — Progress Notes (Signed)
TR[facility] night shift.  The patient is complaining to the nursing staff of lower extremities muscle spasms, which he gets when hypokalemic.  His potassium level was 3.5 mmol/L earlier today before receiving furosemide. BUN is 25 and creatinine 2.12 mg/dL.  K-Lor 40 mEq p.o. x1 dose ordered.  Potassium and magnesium level will be followed in the morning.  Sanda Klein, MD.

## 2020-04-13 NOTE — H&P (Signed)
History and Physical    Roberto FillJoseph T Samples ZOX:[id number] DOB: [birth year] DOA: 04/13/2020  Referring MD/NP/PA:  Tanda RockersMargaux Venter, PA-C PCP: Ronnald NianLalonde, [name], MD  Patient coming from: work  Chief Complaint: Shortness of breath  I have personally briefly reviewed patient's old medical records in [facility]   HPI: Roberto [name] is a 55 y.o. male with medical history significant of combined systolic and diastolic CHF, s/p AICD, PAF on Eliquis, DVT/PE, chronic kidney disease stage III, diverticulitis, and morbid obesity presents with complaints of shortness of breath.  He complained of having difficulty breathing while up and walking around initially last Wednesday 3/23.  Patient initially thought symptoms may have been related to his carvedilol and stopped taking it at that.  He had contacted his cardiologist office about his symptoms.  When they were able to follow back up with him he was advised to decrease his Torsemide from 20 mg twice daily to 20 mg every morning and 10 mg at night as his kidney function was seen to be increasing.  He seemed to be doing okay for a few days and then yesterday evening although his weight has not changed he felt like he was swelling.  He had taken one of his metolazone pills which he has as needed for swelling.  Last night he recalled having episode of feeling like his heart was racing that lasted about 2 minutes and then self resolved.  This morning while he was at work noted that he was having difficulty breathing even walking 200 feet which was unusual for him.  He came to the hospital for further evaluation.  Denies having any recent fever, cough, chest pain, nausea, vomiting, diarrhea, recent sick contacts.  ED Course: Upon admission into the emergency department patient was seen to be afebrile with O2 saturations maintained on room air.  Labs significant for BUN 25, creatinine 2.12, BUN 917, and high-sensitivity troponin 36.  Chest x-ray significant for  cardiomegaly with pulmonary vascular congestion.  Patient was given 40 mg of Lasix IV.  TRH called to admit.  Review of Systems  Constitutional: Negative for fever and weight loss.  HENT: Negative for congestion and ear discharge.   Eyes: Negative for double vision.  Respiratory: Positive for shortness of breath. Negative for cough.   Cardiovascular: Positive for palpitations and leg swelling. Negative for chest pain.  Gastrointestinal: Negative for abdominal pain.  Genitourinary: Negative for dysuria and hematuria.  Musculoskeletal: Negative for falls.  Neurological: Negative for focal weakness and loss of consciousness.  Psychiatric/Behavioral: Negative for memory loss and substance abuse.    Past Medical History:  Diagnosis Date  . AICD (automatic cardioverter/defibrillator) present 08/2018   Medtronic ICD  . Arthritis    knee  . Chronic combined systolic and diastolic CHF (congestive heart failure) (HCC)   . Coronary artery disease   . Deep vein thrombosis (HCC) 2007   left leg  . Diverticulitis   . Hepatitis    patient denies this dx  . Lupus anticoagulant positive   . Morbid obesity (HCC)   . Morbid obesity with BMI of 50.0-59.9, adult (HCC)   . Mural thrombus of heart    coumadin  . Nonischemic cardiomyopathy (HCC)    a) 12/17/11 echo: LVEF 25-30%, grade 3 diastolic dysfunction (c/w restriction), mod MR, mod LA/LV and mild RA dilatation; b) 12/18/11 cMRI: LVEF 38%, mod LV/mild RV dilatation, global HK, mild-mod RV sys dysfxn, no LV thrombus & patchy non-subendocardial delayed enhancement c/w infil dz or  prior myocarditis; c. 07/2018 EF 25-30%.  . Paroxysmal SVT (supraventricular tachycardia) (HCC)   . Pneumonia    x 2  . Pulmonary embolism (HCC)    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  . Wears glasses   . Wound of abdomen     Past Surgical History:  Procedure Laterality Date  . A-FLUTTER ABLATION N/A 11/17/2016   Procedure: A-FLUTTER ABLATION;  Surgeon: Regan Lemming, MD;  Location: [facility] INVASIVE CV LAB;  Service: Cardiovascular;  Laterality: N/A;  . APPLICATION OF WOUND VAC N/A 09/05/2019   Procedure: APPLICATION OF WOUND VAC;  Surgeon: Allena Napoleon, MD;  Location: [facility] OR;  Service: Plastics;  Laterality: N/A;  PATIENT'S HOME WOUND VAC APPLIED.  [name] BIOPSY  12/23/2018   Procedure: BIOPSY;  Surgeon: Meridee Score Netty Starring., MD;  Location: [facility] ENDOSCOPY;  Service: Gastroenterology;;  . BIOPSY  01/23/2020   Procedure: BIOPSY;  Surgeon: Lemar Lofty., MD;  Location: [facility] ENDOSCOPY;  Service: Gastroenterology;;  . CARDIAC CATHETERIZATION  06/2006   Angiographically normal cors  . CARDIAC CATHETERIZATION  12/22/2011   R/LHC: normal cors, well-compensated HDs, LV dysfxn  . CARDIOVERSION N/A 10/17/2016   Procedure: CARDIOVERSION;  Surgeon: Dolores Patty, MD;  Location: [facility] ENDOSCOPY;  Service: Cardiovascular;  Laterality: N/A;  . CARDIOVERSION N/A 03/20/2017   Procedure: CARDIOVERSION;  Surgeon: Dolores Patty, MD;  Location: [facility] ENDOSCOPY;  Service: Cardiovascular;  Laterality: N/A;  . COLECTOMY N/A 12/28/2018   Procedure: TOTAL COLECTOMY;  Surgeon: Emelia Loron, MD;  Location: [facility] OR;  Service: General;  Laterality: N/A;  . FLEXIBLE SIGMOIDOSCOPY N/A 12/23/2018   Procedure: FLEXIBLE SIGMOIDOSCOPY;  Surgeon: Lemar Lofty., MD;  Location: [facility] ENDOSCOPY;  Service: Gastroenterology;  Laterality: N/A;  . FLEXIBLE SIGMOIDOSCOPY N/A 01/23/2020   Procedure: FLEXIBLE SIGMOIDOSCOPY;  Surgeon: Lemar Lofty., MD;  Location: [facility] ENDOSCOPY;  Service: Gastroenterology;  Laterality: N/A;  . ICD IMPLANT N/A 09/03/2018   Procedure: ICD Medtronic IMPLANT;  Surgeon: Regan Lemming, MD;  Location: [facility] INVASIVE CV LAB;  Service: Cardiovascular;  Laterality: N/A;  . ILEOSTOMY N/A 12/28/2018   Procedure: ILEOSTOMY;  Surgeon: Emelia Loron, MD;  Location: [facility] OR;  Service: General;  Laterality: N/A;  . IR RADIOLOGIST EVAL & MGMT   02/08/2019  . JOINT REPLACEMENT     right knee  . LEFT AND RIGHT HEART CATHETERIZATION WITH CORONARY ANGIOGRAM N/A 12/22/2011   Procedure: LEFT AND RIGHT HEART CATHETERIZATION WITH CORONARY ANGIOGRAM;  Surgeon: Dolores Patty, MD;  Location: [facility] CATH LAB;  Service: Cardiovascular;  Laterality: N/A;  . PATELLAR TENDON REPAIR     Left  . RIGHT HEART CATH N/A 10/20/2016   Procedure: RIGHT HEART CATH;  Surgeon: Dolores Patty, MD;  Location: [facility] INVASIVE CV LAB;  Service: Cardiovascular;  Laterality: N/A;  . SKIN SPLIT GRAFT N/A 09/05/2019   Procedure: SKIN GRAFT SPLIT THICKNESS;  Surgeon: Allena Napoleon, MD;  Location: [facility] OR;  Service: Plastics;  Laterality: N/A;  . TEE WITHOUT CARDIOVERSION N/A 03/20/2017   Procedure: TRANSESOPHAGEAL ECHOCARDIOGRAM (TEE);  Surgeon: Dolores Patty, MD;  Location: [facility] ENDOSCOPY;  Service: Cardiovascular;  Laterality: N/A;     reports that he has never smoked. He has never used smokeless tobacco. He reports previous alcohol use. He reports that he does not use drugs.  Allergies  Allergen Reactions  . Shrimp [Shellfish Allergy] Hives and Itching    Family History  Problem Relation Age of Onset  . Hypertension Mother   . Clotting  disorder Mother        mom with PEs  . Cancer Mother        uterine cancer  . Diabetes Father   . Heart attack Father   . Hypertension Other        Sibling  . Diabetes Other   . Obesity Other   . Diabetes Sister   . Obesity Sister   . Obesity Sister   . Colon cancer Neg Hx   . Stomach cancer Neg Hx   . Esophageal cancer Neg Hx   . Rectal cancer Neg Hx   . Inflammatory bowel disease Neg Hx   . Liver disease Neg Hx   . Pancreatic cancer Neg Hx     Prior to Admission medications   Medication Sig Start Date End Date Taking? Authorizing Provider  amiodarone (PACERONE) 200 MG tablet Take 1 tablet (200 mg total) by mouth daily. 11/23/19  Yes Bensimhon, Bevelyn Buckles, MD  carvedilol (COREG) 3.125 MG tablet TAKE 1  TABLET(3.125 MG) BY MOUTH TWICE DAILY WITH A MEAL Patient taking differently: Take 3.125 mg by mouth 2 (two) times daily with a meal. 02/06/20  Yes Bensimhon, Bevelyn Buckles, MD  coconut oil OIL Apply 1 application topically daily as needed (dry skin).    Yes [provider]  digoxin (LANOXIN) 0.125 MG tablet Take 1 tablet (0.125 mg total) by mouth daily. 10/19/19  Yes Bensimhon, Bevelyn Buckles, MD  ELIQUIS 5 MG TABS tablet TAKE 1 TABLET(5 MG) BY MOUTH TWICE DAILY Patient taking differently: Take 5 mg by mouth 2 (two) times daily. 04/04/20  Yes Robbie Lis M, PA-C  metolazone (ZAROXOLYN) 2.5 MG tablet Take 1 tablet (2.5 mg total) by mouth once for 1 dose. Patient taking differently: Take 2.5 mg by mouth once a week. 03/08/20 03/08/20 Yes Milford, Anderson Malta, FNP  potassium chloride SA (KLOR-CON) 20 MEQ tablet Take 1.5 tablets (30 mEq total) by mouth daily. 04/02/20  Yes Simmons, Brittainy M, PA-C  sacubitril-valsartan (ENTRESTO) 49-51 MG Take 1 tablet by mouth 2 (two) times daily. 12/14/19  Yes Bensimhon, Bevelyn Buckles, MD  spironolactone (ALDACTONE) 25 MG tablet Take 1 tablet (25 mg total) by mouth daily. 03/08/20  Yes Milford, Anderson Malta, FNP  torsemide (DEMADEX) 20 MG tablet Take 1 tablet (20 mg total) by mouth in the morning AND 0.5 tablets (10 mg total) every evening. 04/02/20  Yes Allayne Butcher, PA-C    Physical Exam:  Constitutional: Morbidly obese male appears to be in no acute distress at this Vitals:   04/13/20 1101 04/13/20 1102 04/13/20 1259 04/13/20 1315  BP:  106/69 106/72 98/65  Pulse:  77 77 77  Resp:  20 15 16   Temp: 98.3 F (36.8 C) 99 F (37.2 C)    TempSrc: Oral     SpO2:  95% 96% 96%   Eyes: PERRL, lids and conjunctivae normal ENMT: Mucous membranes are moist. Posterior pharynx clear of any exudate or lesions.  Neck: normal, supple, no masses, no thyromegaly Respiratory: Normal respiratory effort with decreased overall aeration.  No significant wheezes or rhonchi  appreciated. Cardiovascular: Regular rate and rhythm, no murmurs / rubs / gallops.  At least +1 pitting lower extremity edema. 2+ pedal pulses. No carotid bruits.  Abdomen: Ileostomy in place. Musculoskeletal: no clubbing / cyanosis. No joint deformity upper and lower extremities. Good ROM, no contractures. Normal muscle tone.  Skin: Venous stasis changes noted at the left leg. Neurologic: CN 2-12 grossly intact. Sensation intact, DTR normal. Strength 5/5  in all 4.  Psychiatric: Normal judgment and insight. Alert and oriented x 3. Normal mood.     Labs on Admission: I have personally reviewed following labs and imaging studies  CBC: Recent Labs  Lab 04/13/20 1126  WBC 9.3  HGB 15.6  HCT 47.7  MCV 92.3  PLT 164   Basic Metabolic Panel: Recent Labs  Lab 04/11/20 0824 04/13/20 1126  NA 138 135  K 3.7 3.5  CL 103 101  CO2 26 24  GLUCOSE 172* 127*  BUN 20 25*  CREATININE 1.94* 2.12*  CALCIUM 8.8* 9.1   GFR: CrCl cannot be calculated (Unknown ideal weight.). Liver Function Tests: No results for input(s): AST, ALT, ALKPHOS, BILITOT, PROT, ALBUMIN in the last 168 hours. No results for input(s): LIPASE, AMYLASE in the last 168 hours. No results for input(s): AMMONIA in the last 168 hours. Coagulation Profile: No results for input(s): INR, PROTIME in the last 168 hours. Cardiac Enzymes: No results for input(s): CKTOTAL, CKMB, CKMBINDEX, TROPONINI in the last 168 hours. BNP (last 3 results) No results for input(s): PROBNP in the last 8760 hours. HbA1C: No results for input(s): HGBA1C in the last 72 hours. CBG: No results for input(s): GLUCAP in the last 168 hours. Lipid Profile: No results for input(s): CHOL, HDL, LDLCALC, TRIG, CHOLHDL, LDLDIRECT in the last 72 hours. Thyroid Function Tests: No results for input(s): TSH, T4TOTAL, FREET4, T3FREE, THYROIDAB in the last 72 hours. Anemia Panel: No results for input(s): VITAMINB12, FOLATE, FERRITIN, TIBC, IRON, RETICCTPCT in  the last 72 hours. Urine analysis:    Component Value Date/Time   COLORURINE YELLOW 12/19/2018 2038   APPEARANCEUR CLEAR 12/19/2018 2038   LABSPEC >1.030 (H) 12/19/2018 2038   LABSPEC 1.025 03/18/2017 1337   PHURINE 5.5 12/19/2018 2038   GLUCOSEU NEGATIVE 12/19/2018 2038   HGBUR MODERATE (A) 12/19/2018 2038   BILIRUBINUR SMALL (A) 12/19/2018 2038   BILIRUBINUR negative 03/18/2017 1337   BILIRUBINUR neg 12/17/2015 0944   KETONESUR NEGATIVE 12/19/2018 2038   PROTEINUR >300 (A) 12/19/2018 2038   UROBILINOGEN negative 12/17/2015 0944   UROBILINOGEN 0.2 12/16/2011 1208   NITRITE NEGATIVE 12/19/2018 2038   LEUKOCYTESUR NEGATIVE 12/19/2018 2038   Sepsis Labs: No results found for this or any previous visit (from the past 240 hour(s)).   Radiological Exams on Admission: DG Chest 2 View  Result Date: 04/13/2020 CLINICAL DATA:  Shortness of breath. EXAM: CHEST - 2 VIEW COMPARISON:  12/16/2019 FINDINGS: A single lead ICD remains in place. The cardiac silhouette remains mildly enlarged. Pulmonary vascular congestion is similar to the prior study. No confluent airspace opacity, pleural effusion, pneumothorax is identified. No acute osseous abnormality is seen. IMPRESSION: Cardiomegaly and pulmonary vascular congestion. Electronically Signed   By: Sebastian Ache M.D.   On: 04/13/2020 11:48    EKG: Independently reviewed.  Undetermined rhythm at 79 bpm  Assessment/Plan Acute on chronic systolic and diastolic congestive heart failure: Patient presents with complaints of shortness of breath.  Recently had torsemide changed from 20 mg twice daily to 20 mg every morning and 10 mg nightly due to worsening kidney for.  On admission patient with at least 1+ pitting edema and BNP elevated at 917 when baseline previously have been in the 300s.  Last EF noted EF of 25-30% with grade 3 diastolic dysfunction in12/2021.  Patient follows with Dr. Gala Romney of cardiology in the outpatient setting. -Admit to a  telemetry bed -Heart failure orders set  initiated  -Continuous pulse oximetry with nasal cannula oxygen  as needed to keep O2 saturations >92% -Strict I&Os and daily weights -Elevate lower extremities -Lasix 40 mg IV Bid -Reassess in a.m. and adjust diuresis as needed. -Cardiology notified of the patient's admission  Acute kidney injury superimposed on chronic kidney disease stage IIIb: Kidney function seem to be acutely worsening with creatinine 2.12 and BUN 25.  Question if symptoms are secondary to hypoperfusion in setting of CHF vs medication. -Hold nephrotoxic agents(spironolactone, Entresto, and digoxin -Continue to monitor kidney function with diuresis  Elevated troponin: Acute.  High-sensitivity troponin 36->37.  Suspecting secondary to demand in the setting of CHF exacerbation.  Patient denies any complaints of chest pain.  H/O VT s/p AICD  history of atrial flutter on chronic anticoagulation: Patient with prior history of ablation.  Heart rates appears relatively rate controlled at this time.  Patient has been instructed not to drive until 5/36 if patient has no subsequent events of VT. -Continue Coreg, amiodarone, and Eliquis -Goal potassium 4 and magnesium 2  History of DVT/PE/LV thrombus lupus anticoagulant positive -Continue Eliquis  Hypertension: Blood pressures currently stable for -Held Entresto, spironolactone, and metolazone  History of diverticulitis s/p resection with ileostomy: From 08/2019 -Ostomy care as needed  Morbid obesity: BMI 54.1 kg/m  DVT prophylaxis: Eliquis Code Status: Full Family Communication: None Disposition Plan: Likely discharge home once medically Consults called: Cardiology Admission status: Inpatient, require more than 2 midnight stay  Clydie Braun MD [facility]   If 7PM-7AM, please contact night-coverage   04/13/2020, 1:28 PM

## 2020-04-13 NOTE — ED Notes (Signed)
Pt ambulatory to the bathroom 

## 2020-04-13 NOTE — ED Provider Notes (Addendum)
MSE was initiated and I personally evaluated the patient and placed orders (if any) at  11:08 AM on April 13, 2020.  The patient appears stable so that the remainder of the MSE may be completed by another provider.  His cardiologist is Dr. Milas Kocher.   Patient placed in Quick Look pathway, seen and evaluated   Chief Complaint: SOB  HPI:   55 y.o. M with PMH/o CHF who presents for evaluation of SOB that has been ongoing for the last few days. It has been intermittently occurring. No CP. He did have some fluttering/palpitations last night. He had called his cardiologist and they interrogated his defibrillator but he hadn't heard back. When SOB returned this AM, he came to the ED. Feels SOB is improved right now. No CP. No shocks from defib.   ROS: SOB (one)  Physical Exam:   Gen: No distress  Neuro: Awake and Alert  Skin: Warm    Focused Exam: No evidence of respiratory distress. Able to speak in full sentences without any difficulty.    Initiation of care has begun. The patient has been counseled on the process, plan, and necessity for staying for the completion/evaluation, and the remainder of the medical screening examination    Portions of this note were generated with Dragon dictation software. Dictation errors may occur despite best attempts at proofreading.    Maxwell Caul, PA-C 04/13/20 1118    Maxwell Caul, PA-C 04/13/20 1131    Terald Sleeper, MD 04/13/20 [id number]

## 2020-04-13 NOTE — Plan of Care (Signed)

## 2020-04-13 NOTE — ED Provider Notes (Signed)
[facility] EMERGENCY DEPARTMENT Provider Note   CSN: [id number] Arrival date & time: 04/13/20  1054     History Chief Complaint  Patient presents with  . Shortness of Breath    Roberto [name] is a 55 y.o. male with PMHx CHF with EF 25-30%, CAD, PAF on Eliquis, and DVT/PE who presents to the ED today with complaint of shortness of breath. Pt reports that last Thursday 03/24 he began having dyspnea on exertion that resolved. He did not think much of it until he began experiencing the same symptoms today. His friend was concerned and advised he come to the ED for further evaluation. Pt also mentions that last night while laying in bed he began experiencing heart palpitations that lasted approximately 3 minutes before dissipated. Denies pain. He does mention he called his cardiologist last week regarding his SOB and they interrogated his defibrillator however he never heard anything back. He does mention that he had similar symptoms in the past that was related to his carvedilol. Pt reports he stopped taking his carvedilol without advisement from his cardiologist to see if it would help however it has not. He does mention that last week he was told that he may be dehydrated and so he was advised to decreased his fluid pill dosage.   Per chart review: Pt had appointment with his cardiologist on 03/17 for follow up. He was noted to be fluid overloaded at previous visit on 02/24 however during follow up his volume status had improved/optivol reviewed and index was down below threshold and impedence back up. Pt advised to continue torsemide 20 mg BID, spiro 25 mg daily, and only take metolazone PRN as advised by [facility].  Labwork done during that visit did show come suggesting dehydration with K being low. He was advised to hold torsemide x 1 day and then reduce dose to 20 qAM and 10 qPM.  Optivol on 03/25 did not suggest fluid accumulation and pt was advised to continue HF meds.   The  history is provided by the patient and medical records.       Past Medical History:  Diagnosis Date  . AICD (automatic cardioverter/defibrillator) present 08/2018   Medtronic ICD  . Arthritis    knee  . Chronic combined systolic and diastolic CHF (congestive heart failure) (HCC)   . Coronary artery disease   . Deep vein thrombosis (HCC) 2007   left leg  . Diverticulitis   . Hepatitis    patient denies this dx  . Lupus anticoagulant positive   . Morbid obesity (HCC)   . Morbid obesity with BMI of 50.0-59.9, adult (HCC)   . Mural thrombus of heart    coumadin  . Nonischemic cardiomyopathy (HCC)    a) 12/17/11 echo: LVEF 25-30%, grade 3 diastolic dysfunction (c/w restriction), mod MR, mod LA/LV and mild RA dilatation; b) 12/18/11 cMRI: LVEF 38%, mod LV/mild RV dilatation, global HK, mild-mod RV sys dysfxn, no LV thrombus & patchy non-subendocardial delayed enhancement c/w infil dz or prior myocarditis; c. 07/2018 EF 25-30%.  . Paroxysmal SVT (supraventricular tachycardia) (HCC)   . Pneumonia    x 2  . Pulmonary embolism (HCC)    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  . Wears glasses   . Wound of abdomen     Patient Active Problem List   Diagnosis Date Noted  . Hx of diverticulitis of colon 12/06/2019  . History of colon resection 12/06/2019  . History of creation of  ostomy (HCC) 12/06/2019  . History of colonic polyps 12/06/2019  . Hypomagnesemia   . 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 History of DVT (deep vein thrombosis) 09/04/2018  . PAF (paroxysmal atrial fibrillation) (HCC) 09/04/2018  . Elevated uric acid in blood 05/06/2018  . Atypical atrial flutter (HCC) 03/17/2017  . Chronic pain of right knee 03/13/2017  . Typical atrial flutter s/p catheter ablation   . Varicose veins of legs 12/17/2015  . Encounter for health maintenance examination in adult 12/17/2015  . Special screening for malignant neoplasms, colon 12/17/2015  . Vaccine  counseling 12/17/2015  . Knee deformity, acquired, right 12/17/2015  . Gait disturbance 12/17/2015  . Osteoarthritis of right knee 12/17/2015  . Varicose veins of lower extremities with ulcer (HCC) 09/18/2015  . Need for prophylactic vaccination and inoculation against influenza 09/18/2015  . Tinea versicolor 06/15/2015  . Impaired fasting blood sugar 12/28/2014  . Chronic anticoagulation 12/28/2014  . Erectile dysfunction 02/19/2012  . Chronic systolic heart failure (HCC) 12/29/2011  . Hypokalemia 12/24/2011  . Lupus anticoagulant positive 12/22/2011  . Nonischemic cardiomyopathy (HCC) 12/16/2011  . Paroxysmal SVT (supraventricular tachycardia) (HCC) 12/16/2011  . History of venous thromboembolism 12/16/2011  . Venous insufficiency 11/04/2011  . Obesity, morbid (HCC) 06/02/2008    Past Surgical History:  Procedure Laterality Date  . A-FLUTTER ABLATION N/A 11/17/2016   Procedure: A-FLUTTER ABLATION;  Surgeon: Regan Lemming, MD;  Location: [facility] INVASIVE CV LAB;  Service: Cardiovascular;  Laterality: N/A;  . APPLICATION OF WOUND VAC N/A 09/05/2019   Procedure: APPLICATION OF WOUND VAC;  Surgeon: Allena Napoleon, MD;  Location: [facility] OR;  Service: Plastics;  Laterality: N/A;  PATIENT'S HOME WOUND VAC APPLIED.  [name] BIOPSY  12/23/2018   Procedure: BIOPSY;  Surgeon: Meridee Score Netty Starring., MD;  Location: [facility] ENDOSCOPY;  Service: Gastroenterology;;  . BIOPSY  01/23/2020   Procedure: BIOPSY;  Surgeon: Lemar Lofty., MD;  Location: [facility] ENDOSCOPY;  Service: Gastroenterology;;  . CARDIAC CATHETERIZATION  06/2006   Angiographically normal cors  . CARDIAC CATHETERIZATION  12/22/2011   R/LHC: normal cors, well-compensated HDs, LV dysfxn  . CARDIOVERSION N/A 10/17/2016   Procedure: CARDIOVERSION;  Surgeon: Dolores Patty, MD;  Location: [facility] ENDOSCOPY;  Service: Cardiovascular;  Laterality: N/A;  . CARDIOVERSION N/A 03/20/2017   Procedure: CARDIOVERSION;  Surgeon: Dolores Patty, MD;   Location: [facility] ENDOSCOPY;  Service: Cardiovascular;  Laterality: N/A;  . COLECTOMY N/A 12/28/2018   Procedure: TOTAL COLECTOMY;  Surgeon: Emelia Loron, MD;  Location: [facility] OR;  Service: General;  Laterality: N/A;  . FLEXIBLE SIGMOIDOSCOPY N/A 12/23/2018   Procedure: FLEXIBLE SIGMOIDOSCOPY;  Surgeon: Lemar Lofty., MD;  Location: [facility] ENDOSCOPY;  Service: Gastroenterology;  Laterality: N/A;  . FLEXIBLE SIGMOIDOSCOPY N/A 01/23/2020   Procedure: FLEXIBLE SIGMOIDOSCOPY;  Surgeon: Lemar Lofty., MD;  Location: [facility] ENDOSCOPY;  Service: Gastroenterology;  Laterality: N/A;  . ICD IMPLANT N/A 09/03/2018   Procedure: ICD Medtronic IMPLANT;  Surgeon: Regan Lemming, MD;  Location: [facility] INVASIVE CV LAB;  Service: Cardiovascular;  Laterality: N/A;  . ILEOSTOMY N/A 12/28/2018   Procedure: ILEOSTOMY;  Surgeon: Emelia Loron, MD;  Location: [facility] OR;  Service: General;  Laterality: N/A;  . IR RADIOLOGIST EVAL & MGMT  02/08/2019  . JOINT REPLACEMENT     right knee  . LEFT AND RIGHT HEART CATHETERIZATION WITH CORONARY ANGIOGRAM N/A 12/22/2011   Procedure: LEFT AND RIGHT HEART CATHETERIZATION WITH CORONARY ANGIOGRAM;  Surgeon: Dolores Patty, MD;  Location: [facility] CATH  LAB;  Service: Cardiovascular;  Laterality: N/A;  . PATELLAR TENDON REPAIR     Left  . RIGHT HEART CATH N/A 10/20/2016   Procedure: RIGHT HEART CATH;  Surgeon: Dolores Patty, MD;  Location: [facility] INVASIVE CV LAB;  Service: Cardiovascular;  Laterality: N/A;  . SKIN SPLIT GRAFT N/A 09/05/2019   Procedure: SKIN GRAFT SPLIT THICKNESS;  Surgeon: Allena Napoleon, MD;  Location: [facility] OR;  Service: Plastics;  Laterality: N/A;  . TEE WITHOUT CARDIOVERSION N/A 03/20/2017   Procedure: TRANSESOPHAGEAL ECHOCARDIOGRAM (TEE);  Surgeon: Dolores Patty, MD;  Location: [facility] ENDOSCOPY;  Service: Cardiovascular;  Laterality: N/A;       Family History  Problem Relation Age of Onset  . Hypertension Mother   . Clotting disorder Mother         mom with PEs  . Cancer Mother        uterine cancer  . Diabetes Father   . Heart attack Father   . Hypertension Other        Sibling  . Diabetes Other   . Obesity Other   . Diabetes Sister   . Obesity Sister   . Obesity Sister   . Colon cancer Neg Hx   . Stomach cancer Neg Hx   . Esophageal cancer Neg Hx   . Rectal cancer Neg Hx   . Inflammatory bowel disease Neg Hx   . Liver disease Neg Hx   . Pancreatic cancer Neg Hx     Social History   Tobacco Use  . Smoking status: Never Smoker  . Smokeless tobacco: Never Used  Vaping Use  . Vaping Use: Never used  Substance Use Topics  . Alcohol use: Not Currently    Comment: Rarely  . Drug use: No    Home Medications Prior to Admission medications   Medication Sig Start Date End Date Taking? Authorizing Provider  amiodarone (PACERONE) 200 MG tablet Take 1 tablet (200 mg total) by mouth daily. 11/23/19  Yes Bensimhon, Bevelyn Buckles, MD  carvedilol (COREG) 3.125 MG tablet TAKE 1 TABLET(3.125 MG) BY MOUTH TWICE DAILY WITH A MEAL Patient taking differently: Take 3.125 mg by mouth 2 (two) times daily with a meal. 02/06/20  Yes Bensimhon, Bevelyn Buckles, MD  coconut oil OIL Apply 1 application topically daily as needed (dry skin).    Yes [provider]  digoxin (LANOXIN) 0.125 MG tablet Take 1 tablet (0.125 mg total) by mouth daily. 10/19/19  Yes Bensimhon, Bevelyn Buckles, MD  ELIQUIS 5 MG TABS tablet TAKE 1 TABLET(5 MG) BY MOUTH TWICE DAILY Patient taking differently: Take 5 mg by mouth 2 (two) times daily. 04/04/20  Yes Robbie Lis M, PA-C  metolazone (ZAROXOLYN) 2.5 MG tablet Take 1 tablet (2.5 mg total) by mouth once for 1 dose. Patient taking differently: Take 2.5 mg by mouth once a week. 03/08/20 03/08/20 Yes Milford, Anderson Malta, FNP  potassium chloride SA (KLOR-CON) 20 MEQ tablet Take 1.5 tablets (30 mEq total) by mouth daily. 04/02/20  Yes Simmons, Brittainy M, PA-C  sacubitril-valsartan (ENTRESTO) 49-51 MG Take 1 tablet by mouth 2  (two) times daily. 12/14/19  Yes Bensimhon, Bevelyn Buckles, MD  spironolactone (ALDACTONE) 25 MG tablet Take 1 tablet (25 mg total) by mouth daily. 03/08/20  Yes Milford, Anderson Malta, FNP  torsemide (DEMADEX) 20 MG tablet Take 1 tablet (20 mg total) by mouth in the morning AND 0.5 tablets (10 mg total) every evening. 04/02/20  Yes Allayne Butcher, PA-C    Allergies  Shrimp [shellfish allergy]  Review of Systems   Review of Systems  Constitutional: Negative for chills, diaphoresis and fever.  Respiratory: Positive for shortness of breath.   Cardiovascular: Positive for palpitations. Negative for chest pain and leg swelling.  All other systems reviewed and are negative.   Physical Exam Updated Vital Signs BP 106/69 (BP Location: Right Arm)   Pulse 77   Temp 99 F (37.2 C)   Resp 20   SpO2 95%   Physical Exam Vitals and nursing note reviewed.  Constitutional:      Appearance: He is obese. He is not ill-appearing or diaphoretic.  HENT:     Head: Normocephalic and atraumatic.  Eyes:     Conjunctiva/sclera: Conjunctivae normal.  Cardiovascular:     Rate and Rhythm: Normal rate and regular rhythm.     Pulses: Normal pulses.  Pulmonary:     Effort: Pulmonary effort is normal.     Breath sounds: Normal breath sounds. No decreased breath sounds, wheezing, rhonchi or rales.     Comments: Speaking in full sentences without difficulty. Satting 95% on RA.  Abdominal:     Palpations: Abdomen is soft.     Tenderness: There is no abdominal tenderness. There is no guarding or rebound.     Comments: Colostomy bag to RLQ with good output  Musculoskeletal:     Cervical back: Neck supple.     Right lower leg: Edema present.     Left lower leg: Edema present.     Comments: 1+ pitting edema bilaterally  Skin:    General: Skin is warm and dry.  Neurological:     Mental Status: He is alert.     ED Results / Procedures / Treatments   Labs (all labs ordered are listed, but only abnormal  results are displayed) Labs Reviewed  BASIC METABOLIC PANEL - Abnormal; Notable for the following components:      Result Value   Glucose, Bld 127 (*)    BUN 25 (*)    Creatinine, Ser 2.12 (*)    GFR, Estimated 36 (*)    All other components within normal limits  BRAIN NATRIURETIC PEPTIDE - Abnormal; Notable for the following components:   B Natriuretic Peptide 917.0 (*)    All other components within normal limits  TROPONIN I (HIGH SENSITIVITY) - Abnormal; Notable for the following components:   Troponin I (High Sensitivity) 36 (*)    All other components within normal limits  SARS CORONAVIRUS 2 (TAT 6-24 HRS)  CBC  TROPONIN I (HIGH SENSITIVITY)    EKG EKG Interpretation  Date/Time:  Friday April 13 2020 11:01:15 EDT Ventricular Rate:  79 PR Interval:  202 QRS Duration: 120 QT Interval:  352 QTC Calculation: 403 R Axis:   -33 Text Interpretation: Undetermined rhythm Left axis deviation Low voltage QRS Cannot rule out Anterior infarct , age undetermined Abnormal ECG No significant change since last tracing Confirmed by Alona Bene [phone]) on 04/13/2020 11:05:51 AM   Radiology DG Chest 2 View  Result Date: 04/13/2020 CLINICAL DATA:  Shortness of breath. EXAM: CHEST - 2 VIEW COMPARISON:  12/16/2019 FINDINGS: A single lead ICD remains in place. The cardiac silhouette remains mildly enlarged. Pulmonary vascular congestion is similar to the prior study. No confluent airspace opacity, pleural effusion, pneumothorax is identified. No acute osseous abnormality is seen. IMPRESSION: Cardiomegaly and pulmonary vascular congestion. Electronically Signed   By: Sebastian Ache M.D.   On: 04/13/2020 11:48    Procedures Procedures   Medications  Ordered in ED Medications  furosemide (LASIX) injection 40 mg (40 mg Intravenous Given 04/13/20 1322)    ED Course  I have reviewed the triage vital signs and the nursing notes.  Pertinent labs & imaging results that were available during my care of  the patient were reviewed by me and considered in my medical decision making (see chart for details).    MDM Rules/Calculators/A&P                           55 year old male with a history of congestive heart failure presents to the ED today complaining of 2 days of dyspnea on exertion, 1 last week on 3/24 and then again today.  Did have 3 minutes of heart palpitations last night, did not feel his defibrillator go off.  Recently seen by cardiology with euvolemia on his OptiVol.  He does endorse that he stopped taking his carvedilol last week when he experienced shortness of breath.  On arrival to the ED patient is afebrile, nontachycardic and nontachypneic.  Blood pressure 106/69.  Satting 95% on room air.  While at rest he is not having increased work of breathing.  He is speaking full sentences without difficulty.  Lungs clear.  He does have 1+ pitting edema bilaterally however states this is unchanged.  He denies missing any doses of his torsemide, spironolactone.  To take his metolazone yesterday.  Will work-up for CHF exacerbation at this time.  Will interrogate defibrillator today.   EKG without significant change since last tracing CBC without leukocytosis. Hgb stable at 15.6. CMP with slightly elevated creatinine 2.12. BUN 25. No other electrolyte abnormalities  Lab Results  Component Value Date   CREATININE 2.12 (H) 04/13/2020   CREATININE 1.94 (H) 04/11/2020   CREATININE 1.92 (H) 03/29/2020    CXR with findings to suggest pulmonary vascular congestion  BNP elevated at 917 (baseline 340's) Troponin 36  Will provide dose of IV lasix at this time and plan to admit to medicine for CHF exacerbation.   Discussed case with [facility] Dr. Katrinka BlazingSmith who agrees to evaluate patient for admission.   This note was prepared using Dragon voice recognition software and may include unintentional dictation errors due to the inherent limitations of voice recognition software.  Final Clinical  Impression(s) / ED Diagnoses Final diagnoses:  Acute on chronic congestive heart failure, unspecified heart failure type [facility](HCC)  Dehydration    Rx / DC Orders ED Discharge Orders    None       Tanda RockersVenter, Spero Gunnels, PA-C 04/13/20 1330    Long, Arlyss RepressJoshua G, MD 04/15/20 [phone]

## 2020-04-14 DIAGNOSIS — I5043 Acute on chronic combined systolic (congestive) and diastolic (congestive) heart failure: Secondary | ICD-10-CM | POA: Diagnosis not present

## 2020-04-14 LAB — BASIC METABOLIC PANEL
Anion gap: 12 (ref 5–15)
BUN: 29 mg/dL — ABNORMAL HIGH (ref 6–20)
CO2: 26 mmol/L (ref 22–32)
Calcium: 8.9 mg/dL (ref 8.9–10.3)
Chloride: 97 mmol/L — ABNORMAL LOW (ref 98–111)
Creatinine, Ser: 2.06 mg/dL — ABNORMAL HIGH (ref 0.61–1.24)
GFR, Estimated: 38 mL/min — ABNORMAL LOW (ref >60)
Glucose, Bld: 110 mg/dL — ABNORMAL HIGH (ref 70–99)
Potassium: 3.2 mmol/L — ABNORMAL LOW (ref 3.5–5.1)
Sodium: 135 mmol/L (ref 135–145)

## 2020-04-14 LAB — MAGNESIUM: Magnesium: 1.9 mg/dL (ref 1.7–2.4)

## 2020-04-14 LAB — DIGOXIN LEVEL: Digoxin Level: <0.2 ng/mL — ABNORMAL LOW (ref 0.8–2.0)

## 2020-04-14 MED ORDER — POTASSIUM CHLORIDE CRYS ER 20 MEQ PO TBCR
40.0000 meq | EXTENDED_RELEASE_TABLET | Freq: Three times a day (TID) | ORAL | Status: AC
  Administered 2020-04-14 – 2020-04-15 (×6): 40 meq via ORAL
  Filled 2020-04-14 (×6): qty 2

## 2020-04-14 MED ORDER — FUROSEMIDE 10 MG/ML IJ SOLN
80.0000 mg | Freq: Two times a day (BID) | INTRAMUSCULAR | Status: DC
  Filled 2020-04-14 (×2): qty 8

## 2020-04-14 MED ORDER — DIGOXIN 125 MCG PO TABS
0.1250 mg | ORAL_TABLET | Freq: Every day | ORAL | Status: DC
  Administered 2020-04-14 – 2020-04-19 (×6): 0.125 mg via ORAL
  Filled 2020-04-14 (×6): qty 1

## 2020-04-14 NOTE — Progress Notes (Signed)
PROGRESS NOTE    Roberto [name]  YTK:[id number] DOB: [birth year] DOA: 04/13/2020 PCP: [name], MD    Brief Narrative:  55 year old gentleman with history of combined heart failure, status post AICD, paroxysmal A. fib on Eliquis, DVT and PE, CKD stage IIIa, diverticulitis status post ileostomy and morbid obesity presented with shortness of breath.  Followed by heart failure clinic, recently adjusted medications.  Short of breath for the last few days.   Assessment & Plan:   Principal Problem:   Acute on chronic combined systolic and diastolic CHF (congestive heart failure) (HCC) Active Problems:   Morbid obesity with BMI of 50.0-59.9, adult (HCC)   Paroxysmal SVT (supraventricular tachycardia) (HCC)   Lupus anticoagulant positive   Chronic anticoagulation   Typical atrial flutter s/p catheter ablation   History of LV (left ventricular) mural thrombus   History of DVT (deep vein thrombosis)   S/P implantation of automatic cardioverter/defibrillator (AICD)  Acute on chronic combined congestive heart failure: Currently remains on IV Lasix.  Good diuresis.  Symptomatically improving.  Last ejection fraction 30%.  Followed by advanced heart failure team. Sherryll Burger on hold due to renal dysfunction. Intake and output monitoring.  Daily weight. Aggressive electrolyte replacement.  Acute kidney injury superimposed on chronic kidney disease stage IIIa: Reported baseline creatinine about 1.3-1.4.  Presented with creatinine of 2.1.  Overnight diuresis and already improving.  History of DVT and PE: On Eliquis and therapeutic.  History of ventricular tachycardia status post AICD: Paroxysmal A. fib.  On Coreg, amiodarone and Eliquis.  Aggressive replacement of electrolytes.  History of diverticulitis status post resection with ileostomy: Ostomy "care.  Stable.    DVT prophylaxis:  apixaban (ELIQUIS) tablet 5 mg   Code Status: Full code Family Communication: None at the  bedside Disposition Plan: Status is: Inpatient  Remains inpatient appropriate because:IV treatments appropriate due to intensity of illness or inability to take PO and Inpatient level of care appropriate due to severity of illness   Dispo: The patient is from: Home              Anticipated d/c is to: Home              Patient currently is not medically stable to d/c.   Difficult to place patient No         Consultants:   Cardiology  Procedures:   None  Antimicrobials:   None   Subjective: Patient seen and examined.  Overnight he had some improvement of symptoms.  He has not mobilized so not sure how much he is going to be short of breath.  Room air at rest.  Denies any chest pain or wheezing. Diuresed more than 6 L since admission.  Objective: Vitals:   04/13/20 1948 04/14/20 0353 04/14/20 0621 04/14/20 0828  BP: 91/74 (!) 97/59 (!) 88/58 94/61  Pulse: 79 70 67 72  Resp: 18 14  20   Temp: (!) 97.5 F (36.4 C) 98.3 F (36.8 C)  98.2 F (36.8 C)  TempSrc: Oral Oral  Oral  SpO2: 92% 97%  95%  Weight:  (!) 159.8 kg      Intake/Output Summary (Last 24 hours) at 04/14/2020 1140 Last data filed at 04/14/2020 1134 Gross per 24 hour  Intake 243 ml  Output 6265 ml  Net -6022 ml   Filed Weights   04/14/20 0353  Weight: (!) 159.8 kg    Examination:  General exam: Appears calm and comfortable  Morbidly obese gentleman laying in room  air.  Not in any distress. Respiratory system: Difficult to auscultate.  Mostly clear bilateral. Cardiovascular system: S1 & S2 heard, RRR.  Trace edema right leg.  None pedal edema left leg where he had a DVT and chronic edema. Gastrointestinal system: Soft.  Distended but nontender.  Midline surgical scar healthy.  Ileostomy right lower quadrant. Central nervous system: Alert and oriented. No focal neurological deficits. Extremities: Symmetric 5 x 5 power. Skin: No rashes, lesions or ulcers Psychiatry: Judgement and insight appear  normal. Mood & affect appropriate.     Data Reviewed: I have personally reviewed following labs and imaging studies  CBC: Recent Labs  Lab 04/13/20 1126  WBC 9.3  HGB 15.6  HCT 47.7  MCV 92.3  PLT 164   Basic Metabolic Panel: Recent Labs  Lab 04/11/20 0824 04/13/20 1126 04/14/20 0413  NA 138 135 135  K 3.7 3.5 3.2*  CL 103 101 97*  CO2 26 24 26   GLUCOSE 172* 127* 110*  BUN 20 25* 29*  CREATININE 1.94* 2.12* 2.06*  CALCIUM 8.8* 9.1 8.9  MG  --   --  1.9   GFR: Estimated Creatinine Clearance: 60.9 mL/min (A) (by C-G formula based on SCr of 2.06 mg/dL (H)). Liver Function Tests: No results for input(s): AST, ALT, ALKPHOS, BILITOT, PROT, ALBUMIN in the last 168 hours. No results for input(s): LIPASE, AMYLASE in the last 168 hours. No results for input(s): AMMONIA in the last 168 hours. Coagulation Profile: No results for input(s): INR, PROTIME in the last 168 hours. Cardiac Enzymes: No results for input(s): CKTOTAL, CKMB, CKMBINDEX, TROPONINI in the last 168 hours. BNP (last 3 results) No results for input(s): PROBNP in the last 8760 hours. HbA1C: No results for input(s): HGBA1C in the last 72 hours. CBG: No results for input(s): GLUCAP in the last 168 hours. Lipid Profile: No results for input(s): CHOL, HDL, LDLCALC, TRIG, CHOLHDL, LDLDIRECT in the last 72 hours. Thyroid Function Tests: No results for input(s): TSH, T4TOTAL, FREET4, T3FREE, THYROIDAB in the last 72 hours. Anemia Panel: No results for input(s): VITAMINB12, FOLATE, FERRITIN, TIBC, IRON, RETICCTPCT in the last 72 hours. Sepsis Labs: No results for input(s): PROCALCITON, LATICACIDVEN in the last 168 hours.  Recent Results (from the past 240 hour(s))  SARS CORONAVIRUS 2 (TAT 6-24 HRS) Nasopharyngeal Nasopharyngeal Swab     Status: None   Collection Time: 04/13/20  1:01 PM   Specimen: Nasopharyngeal Swab  Result Value Ref Range Status   SARS Coronavirus 2 NEGATIVE NEGATIVE Final    Comment:  (NOTE) SARS-CoV-2 target nucleic acids are NOT DETECTED.  The SARS-CoV-2 RNA is generally detectable in upper and lower respiratory specimens during the acute phase of infection. Negative results do not preclude SARS-CoV-2 infection, do not rule out co-infections with other pathogens, and should not be used as the sole basis for treatment or other patient management decisions. Negative results must be combined with clinical observations, patient history, and epidemiological information. The expected result is Negative.  Fact Sheet for Patients: 06/13/20  Fact Sheet for Healthcare Providers: HairSlick.no  This test is not yet approved or cleared by the quierodirigir.com FDA and  has been authorized for detection and/or diagnosis of SARS-CoV-2 by FDA under an Emergency Use Authorization (EUA). This EUA will remain  in effect (meaning this test can be used) for the duration of the COVID-19 declaration under Se ction 564(b)(1) of the Act, 21 U.S.C. section 360bbb-3(b)(1), unless the authorization is terminated or revoked sooner.  Performed at [facility], [address] [place], [place] [address]          Radiology Studies: DG Chest 2 View  Result Date: 04/13/2020 CLINICAL DATA:  Shortness of breath. EXAM: CHEST - 2 VIEW COMPARISON:  12/16/2019 FINDINGS: A single lead ICD remains in place. The cardiac silhouette remains mildly enlarged. Pulmonary vascular congestion is similar to the prior study. No confluent airspace opacity, pleural effusion, pneumothorax is identified. No acute osseous abnormality is seen. IMPRESSION: Cardiomegaly and pulmonary vascular congestion. Electronically Signed   By: Sebastian Ache M.D.   On: 04/13/2020 11:48        Scheduled Meds: . amiodarone  200 mg Oral Daily  . apixaban  5 mg Oral BID  . carvedilol  3.125 mg Oral BID WC  . digoxin  0.125 mg Oral Daily  . furosemide  80 mg  Intravenous BID  . potassium chloride SA  40 mEq Oral TID  . sodium chloride flush  3 mL Intravenous Q12H   Continuous Infusions: . sodium chloride       LOS: 1 day    Time spent: 30 minutes    Dorcas Carrow, MD [facility] Pager [phone]

## 2020-04-14 NOTE — Progress Notes (Signed)
Patient ID: Roberto [name], male   DOB: Jun 06, 1965, 55 y.o.   MRN: [id number]     Advanced Heart Failure Rounding Note  PCP-Cardiologist: Arvilla Meres, MD   Subjective:    Very good diuresis yesterday.  Creatinine 2.12 => 2.06.  SBP 90s, no lightheadedness.    Objective:   Weight Range: (!) 159.8 kg Body mass index is 53.57 kg/m.   Vital Signs:   Temp:  [97.5 F (36.4 C)-99 F (37.2 C)] 98.2 F (36.8 C) (04/02 0828) Pulse Rate:  [67-84] 72 (04/02 0828) Resp:  [14-20] 20 (04/02 0828) BP: (88-130)/(58-82) 94/61 (04/02 0828) SpO2:  [92 %-98 %] 95 % (04/02 0828) Weight:  [159.8 kg] 159.8 kg (04/02 0353) Last BM Date: 04/12/20  Weight change: Filed Weights   04/14/20 0353  Weight: (!) 159.8 kg    Intake/Output:   Intake/Output Summary (Last 24 hours) at 04/14/2020 1011 Last data filed at 04/14/2020 0833 Gross per 24 hour  Intake 243 ml  Output 5265 ml  Net -5022 ml      Physical Exam    General:  Well appearing. No resp difficulty HEENT: Normal Neck: Thick. JVP 10 cm. Carotids 2+ bilat; no bruits. No lymphadenopathy or thyromegaly appreciated. Cor: PMI nonpalpable. Regular rate & rhythm. No rubs, gallops or murmurs. Lungs: Clear Abdomen: Soft, nontender, nondistended. No hepatosplenomegaly. No bruits or masses. Good bowel sounds. Extremities: No cyanosis, clubbing, rash. 1+ edema to knees.  Neuro: Alert & orientedx3, cranial nerves grossly intact. moves all 4 extremities w/o difficulty. Affect pleasant   Telemetry   NSR 60s-70s (personally reviewed)  Labs    CBC Recent Labs    04/13/20 1126  WBC 9.3  HGB 15.6  HCT 47.7  MCV 92.3  PLT 164   Basic Metabolic Panel Recent Labs    76/28/31 1126 04/14/20 0413  NA 135 135  K 3.5 3.2*  CL 101 97*  CO2 24 26  GLUCOSE 127* 110*  BUN 25* 29*  CREATININE 2.12* 2.06*  CALCIUM 9.1 8.9  MG  --  1.9   Liver Function Tests No results for input(s): AST, ALT, ALKPHOS, BILITOT, PROT, ALBUMIN in the last  72 hours. No results for input(s): LIPASE, AMYLASE in the last 72 hours. Cardiac Enzymes No results for input(s): CKTOTAL, CKMB, CKMBINDEX, TROPONINI in the last 72 hours.  BNP: BNP (last 3 results) Recent Labs    09/13/19 0952 04/13/20 1126  BNP 348.4* 917.0*    ProBNP (last 3 results) No results for input(s): PROBNP in the last 8760 hours.   D-Dimer No results for input(s): DDIMER in the last 72 hours. Hemoglobin A1C No results for input(s): HGBA1C in the last 72 hours. Fasting Lipid Panel No results for input(s): CHOL, HDL, LDLCALC, TRIG, CHOLHDL, LDLDIRECT in the last 72 hours. Thyroid Function Tests No results for input(s): TSH, T4TOTAL, T3FREE, THYROIDAB in the last 72 hours.  Invalid input(s): FREET3  Other results:   Imaging    DG Chest 2 View  Result Date: 04/13/2020 CLINICAL DATA:  Shortness of breath. EXAM: CHEST - 2 VIEW COMPARISON:  12/16/2019 FINDINGS: A single lead ICD remains in place. The cardiac silhouette remains mildly enlarged. Pulmonary vascular congestion is similar to the prior study. No confluent airspace opacity, pleural effusion, pneumothorax is identified. No acute osseous abnormality is seen. IMPRESSION: Cardiomegaly and pulmonary vascular congestion. Electronically Signed   By: Sebastian Ache M.D.   On: 04/13/2020 11:48      Medications:     Scheduled Medications: .  amiodarone  200 mg Oral Daily  . apixaban  5 mg Oral BID  . carvedilol  3.125 mg Oral BID WC  . furosemide  80 mg Intravenous BID  . potassium chloride SA  40 mEq Oral TID  . sodium chloride flush  3 mL Intravenous Q12H     Infusions: . sodium chloride       PRN Medications:  sodium chloride, acetaminophen, ondansetron (ZOFRAN) IV, sodium chloride flush   Assessment/Plan   1. Acute on Chronic Systolic Heart Failure - Echo 02/18/40 - LVEF 15% - TEE 03/2017 LVEF 15-20% - Echo 07/2017 with LVEF 20-25% - Echo 7/20 EF 25-30% - Echo 12/21 EF 25-30% - Now admitted  for a/c CHF, NYHA Class III symptoms, w/ volume overload in the setting of recent diuretic dose reduction in the setting of worsening renal function.  - hold Entresto/spiro for now w/elevated creatinine and soft BP  - Continue digoxin, level < 0.2.  - no SGLT2i w/ ileostomy w/ risk of GU/peritoneal infection  - Excellent diuresis yesterday, feeling better.  Still with mild volume overload on exam.  Will give Lasix 80 mg IV bid today, probably back to po tomorrow.  - Aggressive K repletion (KCl 40 tid today).  2.  AKI on Stage III CKD - prior baseline SCr ~1.6 - recent increase to 1.9>>2.1, down to 2.06 today.   - now w/ acute fluid overload w/ CHF - hold Entresto/spiro for now   3. H/o VT - had VT 12/21 in setting of hypokalemia related to metolazone use - no further VT has been detected on most recent device interrogation  - keep K > 4.0 and Mg >2.0  - No driving for 6 months (~7/06).   4. H/o Atrial Flutter s/p AFL Ablation  - Maintaining NSR on EKG today -Continueamiodarone 200 mg.  - ContinueEliquis 5 bid.  5. 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 S/p skin grafting 09/05/19. - Abdominal wound healed, plans for reversal of ileostomy after extensive abdominal hernia repair    Length of Stay: 1  Marca Ancona, MD  04/14/2020, 10:11 AM  Advanced Heart Failure Team Pager [phone] (M-F; 7a - 5p)  Please contact [facility] for night-coverage after hours (5p -7a ) and weekends on [email or web address]

## 2020-04-15 DIAGNOSIS — I5043 Acute on chronic combined systolic (congestive) and diastolic (congestive) heart failure: Secondary | ICD-10-CM | POA: Diagnosis not present

## 2020-04-15 LAB — BASIC METABOLIC PANEL
Anion gap: 11 (ref 5–15)
BUN: 29 mg/dL — ABNORMAL HIGH (ref 6–20)
CO2: 27 mmol/L (ref 22–32)
Calcium: 8.6 mg/dL — ABNORMAL LOW (ref 8.9–10.3)
Chloride: 100 mmol/L (ref 98–111)
Creatinine, Ser: 2.05 mg/dL — ABNORMAL HIGH (ref 0.61–1.24)
GFR, Estimated: 38 mL/min — ABNORMAL LOW (ref >60)
Glucose, Bld: 106 mg/dL — ABNORMAL HIGH (ref 70–99)
Potassium: 3.2 mmol/L — ABNORMAL LOW (ref 3.5–5.1)
Sodium: 138 mmol/L (ref 135–145)

## 2020-04-15 LAB — MAGNESIUM: Magnesium: 1.9 mg/dL (ref 1.7–2.4)

## 2020-04-15 MED ORDER — FUROSEMIDE 10 MG/ML IJ SOLN
40.0000 mg | Freq: Every day | INTRAMUSCULAR | Status: DC
  Administered 2020-04-15 – 2020-04-16 (×2): 40 mg via INTRAVENOUS
  Filled 2020-04-15 (×2): qty 4

## 2020-04-15 NOTE — Progress Notes (Signed)
Patient ID: Roberto [name], male   DOB: 1965-11-03, 55 y.o.   MRN: [id number]     Advanced Heart Failure Rounding Note  PCP-Cardiologist: Arvilla Meres, MD   Subjective:    Remains on IV lasix. Weight down 1 more pound. Creatinine stable at 2.0   Still with DOE and bendopnea. Lasix held since yesterday due to low BP (I was not called) . SBP 95-110  Objective:   Weight Range: (!) 159.3 kg Body mass index is 53.4 kg/m.   Vital Signs:   Temp:  [97.9 F (36.6 C)-98.5 F (36.9 C)] 98.1 F (36.7 C) (04/03 0822) Pulse Rate:  [67-71] 71 (04/03 0822) Resp:  [18-20] 18 (04/03 0822) BP: (95-114)/(56-79) 109/63 (04/03 0822) SpO2:  [91 %-100 %] 93 % (04/03 0822) Weight:  [159.3 kg] 159.3 kg (04/03 0100) Last BM Date: 04/14/20  Weight change: Filed Weights   04/14/20 0353 04/15/20 0100  Weight: (!) 159.8 kg (!) 159.3 kg    Intake/Output:   Intake/Output Summary (Last 24 hours) at 04/15/2020 1014 Last data filed at 04/15/2020 3419 Gross per 24 hour  Intake 483 ml  Output 2330 ml  Net -1847 ml      Physical Exam    General:  Sitting up in bed  No resp difficulty HEENT: normal Neck: supple. JVP hard to see. Carotids 2+ bilat; no bruits. No lymphadenopathy or thryomegaly appreciated. Cor: PMI nondisplaced. Regular No rubs, gallops or murmurs. Lungs: clear Abdomen: obese soft, nontender, nondistended. No hepatosplenomegaly. No bruits or masses. Good bowel sounds. + ileostomy Extremities: no cyanosis, clubbing, rash, 1-2+ edema Neuro: alert & orientedx3, cranial nerves grossly intact. moves all 4 extremities w/o difficulty. Affect pleasant   Telemetry   NSR 70s Personally reviewed  Labs    CBC Recent Labs    04/13/20 1126  WBC 9.3  HGB 15.6  HCT 47.7  MCV 92.3  PLT 164   Basic Metabolic Panel Recent Labs    37/90/24 0413 04/15/20 0322  NA 135 138  K 3.2* 3.2*  CL 97* 100  CO2 26 27  GLUCOSE 110* 106*  BUN 29* 29*  CREATININE 2.06* 2.05*  CALCIUM 8.9  8.6*  MG 1.9 1.9   Liver Function Tests No results for input(s): AST, ALT, ALKPHOS, BILITOT, PROT, ALBUMIN in the last 72 hours. No results for input(s): LIPASE, AMYLASE in the last 72 hours. Cardiac Enzymes No results for input(s): CKTOTAL, CKMB, CKMBINDEX, TROPONINI in the last 72 hours.  BNP: BNP (last 3 results) Recent Labs    09/13/19 0952 04/13/20 1126  BNP 348.4* 917.0*    ProBNP (last 3 results) No results for input(s): PROBNP in the last 8760 hours.   D-Dimer No results for input(s): DDIMER in the last 72 hours. Hemoglobin A1C No results for input(s): HGBA1C in the last 72 hours. Fasting Lipid Panel No results for input(s): CHOL, HDL, LDLCALC, TRIG, CHOLHDL, LDLDIRECT in the last 72 hours. Thyroid Function Tests No results for input(s): TSH, T4TOTAL, T3FREE, THYROIDAB in the last 72 hours.  Invalid input(s): FREET3  Other results:   Imaging    No results found.   Medications:     Scheduled Medications: . amiodarone  200 mg Oral Daily  . apixaban  5 mg Oral BID  . carvedilol  3.125 mg Oral BID WC  . digoxin  0.125 mg Oral Daily  . potassium chloride SA  40 mEq Oral TID  . sodium chloride flush  3 mL Intravenous Q12H    Infusions: .  sodium chloride      PRN Medications: sodium chloride, acetaminophen, ondansetron (ZOFRAN) IV, sodium chloride flush   Assessment/Plan   1. Acute on Chronic Systolic Heart Failure - Echo 00/9/38 - LVEF 15% - TEE 03/2017 LVEF 15-20% - Echo 07/2017 with LVEF 20-25% - Echo 7/20 EF 25-30% - Echo 12/21 EF 25-30% - Now admitted for a/c CHF, NYHA Class III symptoms, w/ volume overload in the setting of recent diuretic dose reduction in the setting of worsening renal function.  - Volume status much improved with IV diuresis. He is 351 pounds, According to flowsheet baseline weight has ranged from 337-351 but he is still symptomatic.Concern for possible low output. Will restart IV lasix. Iff no major response plan RHC  tomorrow.  - continue to hold Entresto for now w/elevated creatinine and soft BP. Can add back spiro with low K  - Continue digoxin, level < 0.2.  - have held of on SGLT2i w/ ileostomy w/ risk of GU/peritoneal infection but I think this is likely low risk. Will add soon - K 3.2 . Aggressive K repletion. Restart spiro 12.5  2.  AKI on Stage III CKD - prior baseline SCr ~1.6 - recent increase to 1.9>>2.1, stable at 2.05 today.   - now w/ acute fluid overload w/ CHF - hold Entresto for now - add spiro today. Consider SGLT2i prior to d/c  3. H/o VT - had VT 12/21 in setting of hypokalemia related to metolazone use - no further VT has been detected on most recent device interrogation  - keep K > 4.0 and Mg >2.0  Treat as above - No driving for 6 months (~1/82).   4. H/o Atrial Flutter s/p AFL Ablation  - Maintaining NSR on EKG today -Continueamiodarone 200 mg.  - ContinueEliquis 5 bid.  5. 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 S/p skin grafting 09/05/19. - Abdominal wound healed, currently no plans for reversal of ileostomy due to hernia   Length of Stay: 2  Arvilla Meres, MD  04/15/2020, 10:14 AM  Advanced Heart Failure Team Pager [phone] (M-F; 7a - 5p)  Please contact [facility] for night-coverage after hours (5p -7a ) and weekends on [email or web address]

## 2020-04-15 NOTE — Progress Notes (Signed)
PROGRESS NOTE    Roberto [name]  KDX:[id number] DOB: [birth year] DOA: 04/13/2020 PCP: [name], MD    Brief Narrative:  55 year old gentleman with history of combined heart failure, status post AICD, paroxysmal A. fib on Eliquis, DVT and PE, CKD stage IIIa, diverticulitis status post ileostomy and morbid obesity presented with shortness of breath.  Followed by heart failure clinic, recently adjusted medications.  Short of breath for the last few days.   Assessment & Plan:   Principal Problem:   Acute on chronic combined systolic and diastolic CHF (congestive heart failure) (HCC) Active Problems:   Morbid obesity with BMI of 50.0-59.9, adult (HCC)   Paroxysmal SVT (supraventricular tachycardia) (HCC)   Lupus anticoagulant positive   Chronic anticoagulation   Typical atrial flutter s/p catheter ablation   History of LV (left ventricular) mural thrombus   History of DVT (deep vein thrombosis)   S/P implantation of automatic cardioverter/defibrillator (AICD)  Acute on chronic combined congestive heart failure: Treated with IV Lasix, does discontinue today with renal dysfunction.   Last ejection fraction 30%.  Followed by advanced heart failure team. Sherryll Burger on hold due to renal dysfunction. Intake and output monitoring.  Daily weight. Aggressive electrolyte replacement.  Acute kidney injury superimposed on chronic kidney disease stage IIIa: Reported baseline creatinine about 1.3-1.4.  Presented with creatinine of 2.1.  Fluctuates.  History of DVT and PE: On Eliquis and therapeutic.  History of ventricular tachycardia status post AICD: Paroxysmal A. fib.  On Coreg, amiodarone and Eliquis.  Aggressive replacement of electrolytes.  Replace potassium further today.  History of diverticulitis status post resection with ileostomy: Ostomy "care.  Stable.    DVT prophylaxis:  apixaban (ELIQUIS) tablet 5 mg   Code Status: Full code Family Communication: None at the  bedside Disposition Plan: Status is: Inpatient  Remains inpatient appropriate because:IV treatments appropriate due to intensity of illness or inability to take PO and Inpatient level of care appropriate due to severity of illness   Dispo: The patient is from: Home              Anticipated d/c is to: Home              Patient currently is not medically stable to d/c.   Difficult to place patient No         Consultants:   Cardiology  Procedures:   None  Antimicrobials:   None   Subjective: Patient seen and examined.  He has some shortness of breath on mobility.  He was very anxious about his renal functions, unable to take adequate diuretics.  Objective: Vitals:   04/15/20 0100 04/15/20 0317 04/15/20 0822 04/15/20 1128  BP:  (!) 100/56 109/63 109/78  Pulse:  68 71 70  Resp:  19 18 18   Temp:  98.5 F (36.9 C) 98.1 F (36.7 C) 97.6 F (36.4 C)  TempSrc:  Oral Oral Oral  SpO2:  93% 93% 91%  Weight: (!) 159.3 kg       Intake/Output Summary (Last 24 hours) at 04/15/2020 1313 Last data filed at 04/15/2020 06/15/2020 Gross per 24 hour  Intake 483 ml  Output 1655 ml  Net -1172 ml   Filed Weights   04/14/20 0353 04/15/20 0100  Weight: (!) 159.8 kg (!) 159.3 kg    Examination:  General exam: Appears calm and comfortable  Morbidly obese gentleman laying in room air.  Not in any distress at rest. Respiratory system: Difficult to auscultate.  Mostly clear bilateral. Cardiovascular  system: S1 & S2 heard, RRR.  Trace edema right leg.  None pedal edema left leg where he had a DVT and chronic edema. Gastrointestinal system: Soft.  Distended but nontender.  Midline surgical scar healthy.  Ileostomy right lower quadrant. Central nervous system: Alert and oriented. No focal neurological deficits. Extremities: Symmetric 5 x 5 power. Skin: No rashes, lesions or ulcers Psychiatry: Judgement and insight appear normal. Mood & affect appropriate.     Data Reviewed: I have  personally reviewed following labs and imaging studies  CBC: Recent Labs  Lab 04/13/20 1126  WBC 9.3  HGB 15.6  HCT 47.7  MCV 92.3  PLT 164   Basic Metabolic Panel: Recent Labs  Lab 04/11/20 0824 04/13/20 1126 04/14/20 0413 04/15/20 0322  NA 138 135 135 138  K 3.7 3.5 3.2* 3.2*  CL 103 101 97* 100  CO2 26 24 26 27   GLUCOSE 172* 127* 110* 106*  BUN 20 25* 29* 29*  CREATININE 1.94* 2.12* 2.06* 2.05*  CALCIUM 8.8* 9.1 8.9 8.6*  MG  --   --  1.9 1.9   GFR: Estimated Creatinine Clearance: 61.1 mL/min (A) (by C-G formula based on SCr of 2.05 mg/dL (H)). Liver Function Tests: No results for input(s): AST, ALT, ALKPHOS, BILITOT, PROT, ALBUMIN in the last 168 hours. No results for input(s): LIPASE, AMYLASE in the last 168 hours. No results for input(s): AMMONIA in the last 168 hours. Coagulation Profile: No results for input(s): INR, PROTIME in the last 168 hours. Cardiac Enzymes: No results for input(s): CKTOTAL, CKMB, CKMBINDEX, TROPONINI in the last 168 hours. BNP (last 3 results) No results for input(s): PROBNP in the last 8760 hours. HbA1C: No results for input(s): HGBA1C in the last 72 hours. CBG: No results for input(s): GLUCAP in the last 168 hours. Lipid Profile: No results for input(s): CHOL, HDL, LDLCALC, TRIG, CHOLHDL, LDLDIRECT in the last 72 hours. Thyroid Function Tests: No results for input(s): TSH, T4TOTAL, FREET4, T3FREE, THYROIDAB in the last 72 hours. Anemia Panel: No results for input(s): VITAMINB12, FOLATE, FERRITIN, TIBC, IRON, RETICCTPCT in the last 72 hours. Sepsis Labs: No results for input(s): PROCALCITON, LATICACIDVEN in the last 168 hours.  Recent Results (from the past 240 hour(s))  SARS CORONAVIRUS 2 (TAT 6-24 HRS) Nasopharyngeal Nasopharyngeal Swab     Status: None   Collection Time: 04/13/20  1:01 PM   Specimen: Nasopharyngeal Swab  Result Value Ref Range Status   SARS Coronavirus 2 NEGATIVE NEGATIVE Final    Comment:  (NOTE) SARS-CoV-2 target nucleic acids are NOT DETECTED.  The SARS-CoV-2 RNA is generally detectable in upper and lower respiratory specimens during the acute phase of infection. Negative results do not preclude SARS-CoV-2 infection, do not rule out co-infections with other pathogens, and should not be used as the sole basis for treatment or other patient management decisions. Negative results must be combined with clinical observations, patient history, and epidemiological information. The expected result is Negative.  Fact Sheet for Patients: 06/13/20  Fact Sheet for Healthcare Providers: HairSlick.no  This test is not yet approved or cleared by the quierodir[employer] FDA and  has been authorized for detection and/or diagnosis of SARS-CoV-2 by FDA under an Emergency Use Authorization (EUA). This EUA will remain  in effect (meaning this test can be used) for the duration of the COVID-19 declaration under Se ction 564(b)(1) of the Act, 21 U.S.C. section 360bbb-3(b)(1), unless the authorization is terminated or revoked sooner.  Performed at [facility], [address], [place], [place] [address]          Radiology Studies: No results found.      Scheduled Meds: . amiodarone  200 mg Oral Daily  . apixaban  5 mg Oral BID  . carvedilol  3.125 mg Oral BID WC  . digoxin  0.125 mg Oral Daily  . potassium chloride SA  40 mEq Oral TID  . sodium chloride flush  3 mL Intravenous Q12H   Continuous Infusions: . sodium chloride       LOS: 2 days    Time spent: 30 minutes    Dorcas Carrow, MD [facility] Pager [phone]

## 2020-04-16 DIAGNOSIS — I5043 Acute on chronic combined systolic (congestive) and diastolic (congestive) heart failure: Secondary | ICD-10-CM | POA: Diagnosis not present

## 2020-04-16 LAB — BASIC METABOLIC PANEL
Anion gap: 12 (ref 5–15)
BUN: 28 mg/dL — ABNORMAL HIGH (ref 6–20)
CO2: 27 mmol/L (ref 22–32)
Calcium: 8.6 mg/dL — ABNORMAL LOW (ref 8.9–10.3)
Chloride: 98 mmol/L (ref 98–111)
Creatinine, Ser: 1.97 mg/dL — ABNORMAL HIGH (ref 0.61–1.24)
GFR, Estimated: 40 mL/min — ABNORMAL LOW (ref >60)
Glucose, Bld: 131 mg/dL — ABNORMAL HIGH (ref 70–99)
Potassium: 3.3 mmol/L — ABNORMAL LOW (ref 3.5–5.1)
Sodium: 137 mmol/L (ref 135–145)

## 2020-04-16 MED ORDER — SPIRONOLACTONE 12.5 MG HALF TABLET
12.5000 mg | ORAL_TABLET | Freq: Every day | ORAL | Status: DC
  Administered 2020-04-16 – 2020-04-18 (×3): 12.5 mg via ORAL
  Filled 2020-04-16 (×3): qty 1

## 2020-04-16 MED ORDER — METOLAZONE 5 MG PO TABS
5.0000 mg | ORAL_TABLET | ORAL | Status: AC
  Administered 2020-04-16: 5 mg via ORAL
  Filled 2020-04-16: qty 1

## 2020-04-16 MED ORDER — POTASSIUM CHLORIDE CRYS ER 20 MEQ PO TBCR
60.0000 meq | EXTENDED_RELEASE_TABLET | Freq: Three times a day (TID) | ORAL | Status: DC
  Administered 2020-04-16 – 2020-04-19 (×9): 60 meq via ORAL
  Filled 2020-04-16 (×9): qty 3

## 2020-04-16 MED ORDER — FUROSEMIDE 10 MG/ML IJ SOLN
80.0000 mg | INTRAMUSCULAR | Status: AC
  Administered 2020-04-16: 80 mg via INTRAVENOUS
  Filled 2020-04-16: qty 8

## 2020-04-16 MED ORDER — POTASSIUM CHLORIDE CRYS ER 20 MEQ PO TBCR
40.0000 meq | EXTENDED_RELEASE_TABLET | Freq: Three times a day (TID) | ORAL | Status: DC
  Administered 2020-04-16: 40 meq via ORAL
  Filled 2020-04-16: qty 2

## 2020-04-16 MED ORDER — APIXABAN 5 MG PO TABS: 5.0000 mg | ORAL_TABLET | Freq: Two times a day (BID) | ORAL | Status: DC

## 2020-04-16 MED ORDER — FUROSEMIDE 10 MG/ML IJ SOLN
120.0000 mg | Freq: Two times a day (BID) | INTRAVENOUS | Status: DC
  Administered 2020-04-16 – 2020-04-17 (×2): 120 mg via INTRAVENOUS
  Filled 2020-04-16: qty 10
  Filled 2020-04-16: qty 12
  Filled 2020-04-16: qty 10

## 2020-04-16 MED ORDER — MAGNESIUM SULFATE 2 GM/50ML IV SOLN
2.0000 g | Freq: Once | INTRAVENOUS | Status: AC
  Administered 2020-04-16: 2 g via INTRAVENOUS
  Filled 2020-04-16: qty 50

## 2020-04-16 NOTE — Progress Notes (Signed)
PROGRESS NOTE    Roberto [name]  YCX:[id number] DOB: 1965-08-09 DOA: 04/13/2020 PCP: [name], MD    Brief Narrative:  55 year old gentleman with history of combined heart failure, status post AICD, paroxysmal A. fib on Eliquis, DVT and PE, CKD stage IIIa, diverticulitis status post ileostomy and morbid obesity presented with shortness of breath.  Followed by heart failure clinic, recently adjusted medications.  Short of breath for the last few days.   Assessment & Plan:   Principal Problem:   Acute on chronic combined systolic and diastolic CHF (congestive heart failure) (HCC) Active Problems:   Morbid obesity with BMI of 50.0-59.9, adult (HCC)   Paroxysmal SVT (supraventricular tachycardia) (HCC)   Lupus anticoagulant positive   Chronic anticoagulation   Typical atrial flutter s/p catheter ablation   History of LV (left ventricular) mural thrombus   History of DVT (deep vein thrombosis)   S/P implantation of automatic cardioverter/defibrillator (AICD)  Acute on chronic combined congestive heart failure: Patient admitted with significant fluid overload.  Followed by advanced heart failure team. No adequate diuresis, today on high dose of Lasix with high-dose potassium replacement. Last ejection fraction 30%.  Followed by advanced heart failure team. Sherryll Burger on hold due to renal dysfunction. Intake and output monitoring.  Daily weight. Aggressive electrolyte replacement. Possible right heart cath tomorrow.  Acute kidney injury superimposed on chronic kidney disease stage IIIa: Reported baseline creatinine about 1.3-1.4.  Presented with creatinine of 2.1.  Fluctuates but is stabilizing.  Today creatinine is 1.97.  History of DVT and PE: On Eliquis and therapeutic.  History of ventricular tachycardia status post AICD: Paroxysmal A. fib.  On Coreg, digoxin, amiodarone and Eliquis.  Aggressive replacement of electrolytes.  Replace potassium further today.  History of  diverticulitis status post resection with ileostomy: Ostomy care.  Stable.    DVT prophylaxis:  apixaban (ELIQUIS) tablet 5 mg   Code Status: Full code Family Communication: None at the bedside Disposition Plan: Status is: Inpatient  Remains inpatient appropriate because:IV treatments appropriate due to intensity of illness or inability to take PO and Inpatient level of care appropriate due to severity of illness   Dispo: The patient is from: Home              Anticipated d/c is to: Home              Patient currently is not medically stable to d/c.   Difficult to place patient No         Consultants:   Cardiology  Procedures:   None  Antimicrobials:   None   Subjective: Patient seen and examined.  He feels that shortness of breath is slightly better.  Able to walk to the bathroom without trouble.  He has not tried to walk to the hallway. Objective: Vitals:   04/16/20 0445 04/16/20 0500 04/16/20 0854 04/16/20 0857  BP: 114/81   115/68  Pulse: 68  65   Resp: 19   18  Temp: (!) 97.5 F (36.4 C)   97.8 F (36.6 C)  TempSrc: Oral   Oral  SpO2: 99%   95%  Weight:  (!) 159.3 kg      Intake/Output Summary (Last 24 hours) at 04/16/2020 1122 Last data filed at 04/16/2020 1038 Gross per 24 hour  Intake 703 ml  Output 3000 ml  Net -2297 ml   Filed Weights   04/14/20 0353 04/15/20 0100 04/16/20 0500  Weight: (!) 159.8 kg (!) 159.3 kg (!) 159.3 kg  Examination:  General exam: Appears calm and comfortable. Morbidly obese gentleman laying in room air.  Not in any distress at rest. Respiratory system: Difficult to auscultate.  Mostly clear bilateral. Cardiovascular system: S1 & S2 heard, RRR.  Trace edema right leg.  Non-pedal edema left leg where he had a DVT and chronic edema. Gastrointestinal system: Soft.  Distended but nontender.  Midline surgical scar healthy.  Ileostomy right lower quadrant. Central nervous system: Alert and oriented. No focal  neurological deficits. Extremities: Symmetric 5 x 5 power. Skin: No rashes, lesions or ulcers Psychiatry: Judgement and insight appear normal. Mood & affect appropriate.     Data Reviewed: I have personally reviewed following labs and imaging studies  CBC: Recent Labs  Lab 04/13/20 1126  WBC 9.3  HGB 15.6  HCT 47.7  MCV 92.3  PLT 164   Basic Metabolic Panel: Recent Labs  Lab 04/11/20 0824 04/13/20 1126 04/14/20 0413 04/15/20 0322 04/16/20 0635  NA 138 135 135 138 137  K 3.7 3.5 3.2* 3.2* 3.3*  CL 103 101 97* 100 98  CO2 26 24 26 27 27   GLUCOSE 172* 127* 110* 106* 131*  BUN 20 25* 29* 29* 28*  CREATININE 1.94* 2.12* 2.06* 2.05* 1.97*  CALCIUM 8.8* 9.1 8.9 8.6* 8.6*  MG  --   --  1.9 1.9  --    GFR: Estimated Creatinine Clearance: 63.5 mL/min (A) (by C-G formula based on SCr of 1.97 mg/dL (H)). Liver Function Tests: No results for input(s): AST, ALT, ALKPHOS, BILITOT, PROT, ALBUMIN in the last 168 hours. No results for input(s): LIPASE, AMYLASE in the last 168 hours. No results for input(s): AMMONIA in the last 168 hours. Coagulation Profile: No results for input(s): INR, PROTIME in the last 168 hours. Cardiac Enzymes: No results for input(s): CKTOTAL, CKMB, CKMBINDEX, TROPONINI in the last 168 hours. BNP (last 3 results) No results for input(s): PROBNP in the last 8760 hours. HbA1C: No results for input(s): HGBA1C in the last 72 hours. CBG: No results for input(s): GLUCAP in the last 168 hours. Lipid Profile: No results for input(s): CHOL, HDL, LDLCALC, TRIG, CHOLHDL, LDLDIRECT in the last 72 hours. Thyroid Function Tests: No results for input(s): TSH, T4TOTAL, FREET4, T3FREE, THYROIDAB in the last 72 hours. Anemia Panel: No results for input(s): VITAMINB12, FOLATE, FERRITIN, TIBC, IRON, RETICCTPCT in the last 72 hours. Sepsis Labs: No results for input(s): PROCALCITON, LATICACIDVEN in the last 168 hours.  Recent Results (from the past 240 hour(s))  SARS  CORONAVIRUS 2 (TAT 6-24 HRS) Nasopharyngeal Nasopharyngeal Swab     Status: None   Collection Time: 04/13/20  1:01 PM   Specimen: Nasopharyngeal Swab  Result Value Ref Range Status   SARS Coronavirus 2 NEGATIVE NEGATIVE Final    Comment: (NOTE) SARS-CoV-2 target nucleic acids are NOT DETECTED.  The SARS-CoV-2 RNA is generally detectable in upper and lower respiratory specimens during the acute phase of infection. Negative results do not preclude SARS-CoV-2 infection, do not rule out co-infections with other pathogens, and should not be used as the sole basis for treatment or other patient management decisions. Negative results must be combined with clinical observations, patient history, and epidemiological information. The expected result is Negative.  Fact Sheet for Patients: 06/13/20  Fact Sheet for Healthcare Providers: HairSlick.no  This test is not yet approved or cleared by the quierodirigir.com FDA and  has been authorized for detection and/or diagnosis of SARS-CoV-2 by FDA under an Emergency Use Authorization (EUA). This EUA will remain  in effect (meaning this test can be used) for the duration of the COVID-19 declaration under Se ction 564(b)(1) of the Act, 21 U.S.C. section 360bbb-3(b)(1), unless the authorization is terminated or revoked sooner.  Performed at [facility], [address], [place], [place] [address]          Radiology Studies: No results found.      Scheduled Meds: . amiodarone  200 mg Oral Daily  . apixaban  5 mg Oral BID  . carvedilol  3.125 mg Oral BID WC  . digoxin  0.125 mg Oral Daily  . potassium chloride SA  60 mEq Oral TID  . sodium chloride flush  3 mL Intravenous Q12H  . spironolactone  12.5 mg Oral Daily   Continuous Infusions: . sodium chloride    . furosemide       LOS: 3 days    Time spent: 25 minutes    Dorcas Carrow, MD [facility] Pager [phone]

## 2020-04-16 NOTE — Progress Notes (Addendum)
Patient ID: Roberto [name], male   DOB: 1965/08/15, 55 y.o.   MRN: [id number]     Advanced Heart Failure Rounding Note  PCP-Cardiologist: Arvilla Meres, MD   Subjective:    -2L in UOP yesterday w/ IV Lasix, though wt is unchanged, 351 lb. Had orthopnea last night.   Scr slightly improved, 2.05>>1.97.  K 3.3   Sitting up in bed. No dyspnea currently.   Objective:   Weight Range: (!) 159.3 kg Body mass index is 53.4 kg/m.   Vital Signs:   Temp:  [97.5 F (36.4 C)-98.5 F (36.9 C)] 97.5 F (36.4 C) (04/04 0445) Pulse Rate:  [67-71] 68 (04/04 0445) Resp:  [16-19] 19 (04/04 0445) BP: (106-117)/(63-82) 114/81 (04/04 0445) SpO2:  [86 %-99 %] 99 % (04/04 0445) Weight:  [159.3 kg] 159.3 kg (04/04 0500) Last BM Date: 04/15/20  Weight change: Filed Weights   04/14/20 0353 04/15/20 0100 04/16/20 0500  Weight: (!) 159.8 kg (!) 159.3 kg (!) 159.3 kg    Intake/Output:   Intake/Output Summary (Last 24 hours) at 04/16/2020 0755 Last data filed at 04/16/2020 0449 Gross per 24 hour  Intake 723 ml  Output 2300 ml  Net -1577 ml      Physical Exam    PHYSICAL EXAM: General:  Super morbidly obese. No respiratory difficulty HEENT: normal Neck: supple.  JVD ~8 cm. Carotids 2+ bilat; no bruits. No lymphadenopathy or thyromegaly appreciated. Cor: PMI nondisplaced. Regular rate & rhythm. No rubs, gallops or murmurs. Lungs: clear Abdomen: obese soft, nontender, nondistended. No hepatosplenomegaly. No bruits or masses. Good bowel sounds. + Rt sided ileostomy Extremities: no cyanosis, clubbing, rash, obese LEs no edema, + bilateral chronic venous stasis dermatitis  Neuro: alert & oriented x 3, cranial nerves grossly intact. moves all 4 extremities w/o difficulty. Affect pleasant.  Telemetry   NSR 70s Personally reviewed  Labs    CBC Recent Labs    04/13/20 1126  WBC 9.3  HGB 15.6  HCT 47.7  MCV 92.3  PLT 164   Basic Metabolic Panel Recent Labs    56/31/49 0413  04/15/20 0322 04/16/20 0635  NA 135 138 137  K 3.2* 3.2* 3.3*  CL 97* 100 98  CO2 26 27 27   GLUCOSE 110* 106* 131*  BUN 29* 29* 28*  CREATININE 2.06* 2.05* 1.97*  CALCIUM 8.9 8.6* 8.6*  MG 1.9 1.9  --    Liver Function Tests No results for input(s): AST, ALT, ALKPHOS, BILITOT, PROT, ALBUMIN in the last 72 hours. No results for input(s): LIPASE, AMYLASE in the last 72 hours. Cardiac Enzymes No results for input(s): CKTOTAL, CKMB, CKMBINDEX, TROPONINI in the last 72 hours.  BNP: BNP (last 3 results) Recent Labs    09/13/19 0952 04/13/20 1126  BNP 348.4* 917.0*    ProBNP (last 3 results) No results for input(s): PROBNP in the last 8760 hours.   D-Dimer No results for input(s): DDIMER in the last 72 hours. Hemoglobin A1C No results for input(s): HGBA1C in the last 72 hours. Fasting Lipid Panel No results for input(s): CHOL, HDL, LDLCALC, TRIG, CHOLHDL, LDLDIRECT in the last 72 hours. Thyroid Function Tests No results for input(s): TSH, T4TOTAL, T3FREE, THYROIDAB in the last 72 hours.  Invalid input(s): FREET3  Other results:   Imaging    No results found.   Medications:     Scheduled Medications: . amiodarone  200 mg Oral Daily  . apixaban  5 mg Oral BID  . carvedilol  3.125 mg Oral BID  WC  . digoxin  0.125 mg Oral Daily  . furosemide  40 mg Intravenous Daily  . sodium chloride flush  3 mL Intravenous Q12H    Infusions: . sodium chloride      PRN Medications: sodium chloride, acetaminophen, ondansetron (ZOFRAN) IV, sodium chloride flush   Assessment/Plan   1. Acute on Chronic Systolic Heart Failure - Echo 24/2/35 - LVEF 15% - TEE 03/2017 LVEF 15-20% - Echo 07/2017 with LVEF 20-25% - Echo 7/20 EF 25-30% - Echo 12/21 EF 25-30% - Now admitted for a/c CHF, NYHA Class III symptoms, w/ volume overload in the setting of recent diuretic dose reduction in the setting of worsening renal function.  - Volume status much improved with IV diuresis. He is  351 pounds, According to flowsheet baseline weight has ranged from 337-351 but he is still symptomatic.Concern for possible low output. Will plan for RHC  - continue to hold Entresto for now w/elevated creatinine and soft BP.  - restart spiro 12.5 mg daily  - Continue digoxin, level < 0.2.  - have held of on SGLT2i w/ ileostomy w/ risk of GU/peritoneal infection but I think this is likely low risk. Will add soon  2.  AKI on Stage III CKD - prior baseline SCr ~1.6 - recent increase to 1.9>>2.1, stable at 1.95 today.   - now w/ acute fluid overload w/ CHF - hold Entresto for now - add back spiro 12.5 mg daily  - Consider SGLT2i prior to d/c  3. H/o VT - had VT 12/21 in setting of hypokalemia related to metolazone use - no further VT has been detected on most recent device interrogation  - keep K > 4.0 and Mg >2.0  Treat as above - No driving for 6 months (~3/61).  4. H/o Atrial Flutter s/p AFL Ablation  - Maintaining NSR on EKG today -Continueamiodarone 200 mg.  - ContinueEliquis 5 bid.  5. 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 S/p skin grafting 09/05/19. - Abdominal wound healed, currently no plans for reversal of ileostomy due to hernia  6. Hyponatremia - K 3.3  - replete w/ KCl - restart spiro today    Length of Stay: 3  Brittainy Simmons, PA-C  04/16/2020, 7:55 AM  Advanced Heart Failure Team Pager [phone] (M-F; 7a - 5p)  Please contact [facility] for night-coverage after hours (5p -7a ) and weekends on [email or web address]  Patient seen and examined with the above-signed Advanced Practice Provider and/or Housestaff. I personally reviewed laboratory data, imaging studies and relevant notes. I independently examined the patient and formulated the important aspects of the plan. I have edited the note to reflect any of my changes or salient points. I have personally discussed the plan with the patient and/or family.  Continues  with SOB and orthopnea. Good diuresis with IV lasix but weight unchanged. Creatinine slightly better.   General:  Sitting up in bed  No resp difficulty HEENT: normal Neck: supple. JVP appears elevated Carotids 2+ bilat; no bruits. No lymphadenopathy or thryomegaly appreciated. Cor: PMI nondisplaced. Regular rate & rhythm. No rubs, gallops or murmurs. Lungs: clear Abdomen: obese soft, nontender, nondistended. No hepatosplenomegaly. No bruits or masses. Good bowel sounds. + ileostomy  Extremities: no cyanosis, clubbing, rash, 1+ edema Neuro: alert & orientedx3, cranial nerves grossly intact. moves all 4 extremities w/o difficulty. Affect pleasant  Remains symptomatic but not responding well to IV lasix. Will increase lasix to 120 IV bid and give metolazone 5. If no  response plan RHC tomorrow.   Arvilla Meres, MD  8:39 AM

## 2020-04-17 ENCOUNTER — Inpatient Hospital Stay (HOSPITAL_COMMUNITY): Payer: BC Managed Care – PPO

## 2020-04-17 ENCOUNTER — Encounter (HOSPITAL_COMMUNITY)
Admission: EM | Disposition: A | Discharge status: Home / Self Care | Payer: Self-pay | Source: Home / Self Care | Attending: Internal Medicine

## 2020-04-17 ENCOUNTER — Other Ambulatory Visit (HOSPITAL_COMMUNITY): Payer: Self-pay

## 2020-04-17 DIAGNOSIS — N179 Acute kidney failure, unspecified: Secondary | ICD-10-CM | POA: Diagnosis not present

## 2020-04-17 DIAGNOSIS — N183 Chronic kidney disease, stage 3 unspecified: Secondary | ICD-10-CM

## 2020-04-17 DIAGNOSIS — I5022 Chronic systolic (congestive) heart failure: Secondary | ICD-10-CM

## 2020-04-17 DIAGNOSIS — I5043 Acute on chronic combined systolic (congestive) and diastolic (congestive) heart failure: Secondary | ICD-10-CM | POA: Diagnosis not present

## 2020-04-17 HISTORY — PX: RIGHT HEART CATH: CATH118263

## 2020-04-17 LAB — BASIC METABOLIC PANEL
Anion gap: 13 (ref 5–15)
BUN: 31 mg/dL — ABNORMAL HIGH (ref 6–20)
CO2: 30 mmol/L (ref 22–32)
Calcium: 9 mg/dL (ref 8.9–10.3)
Chloride: 93 mmol/L — ABNORMAL LOW (ref 98–111)
Creatinine, Ser: 2.39 mg/dL — ABNORMAL HIGH (ref 0.61–1.24)
GFR, Estimated: 31 mL/min — ABNORMAL LOW (ref >60)
Glucose, Bld: 157 mg/dL — ABNORMAL HIGH (ref 70–99)
Potassium: 3.4 mmol/L — ABNORMAL LOW (ref 3.5–5.1)
Sodium: 136 mmol/L (ref 135–145)

## 2020-04-17 LAB — POCT I-STAT 7, (LYTES, BLD GAS, ICA,H+H)
Acid-Base Excess: 8 mmol/L — ABNORMAL HIGH (ref 0.0–2.0)
Bicarbonate: 34.6 mmol/L — ABNORMAL HIGH (ref 20.0–28.0)
Calcium, Ion: 1.05 mmol/L — ABNORMAL LOW (ref 1.15–1.40)
HCT: 55 % — ABNORMAL HIGH (ref 39.0–52.0)
Hemoglobin: 18.7 g/dL — ABNORMAL HIGH (ref 13.0–17.0)
O2 Saturation: 67 %
Potassium: 3.2 mmol/L — ABNORMAL LOW (ref 3.5–5.1)
Sodium: 140 mmol/L (ref 135–145)
TCO2: 36 mmol/L — ABNORMAL HIGH (ref 22–32)
pCO2 arterial: 50 mmHg — ABNORMAL HIGH (ref 32.0–48.0)
pH, Arterial: 7.448 (ref 7.350–7.450)
pO2, Arterial: 34 mmHg — CL (ref 83.0–108.0)

## 2020-04-17 LAB — MAGNESIUM: Magnesium: 2.2 mg/dL (ref 1.7–2.4)

## 2020-04-17 SURGERY — RIGHT HEART CATH
Anesthesia: LOCAL

## 2020-04-17 MED ORDER — LIDOCAINE HCL (PF) 1 % IJ SOLN
INTRAMUSCULAR | Status: AC
  Filled 2020-04-17: qty 30

## 2020-04-17 MED ORDER — SODIUM CHLORIDE 0.9% FLUSH: 3.0000 mL | INTRAVENOUS | Status: DC | PRN

## 2020-04-17 MED ORDER — SODIUM CHLORIDE 0.9 % IV SOLN: INTRAVENOUS | Status: DC

## 2020-04-17 MED ORDER — HEPARIN (PORCINE) IN NACL 1000-0.9 UT/500ML-% IV SOLN
INTRAVENOUS | Status: AC
  Filled 2020-04-17: qty 500

## 2020-04-17 MED ORDER — ONDANSETRON HCL 4 MG/2ML IJ SOLN: 4.0000 mg | Freq: Four times a day (QID) | INTRAMUSCULAR | Status: DC | PRN

## 2020-04-17 MED ORDER — ACETAMINOPHEN 325 MG PO TABS: 650.0000 mg | ORAL_TABLET | ORAL | Status: DC | PRN

## 2020-04-17 MED ORDER — SODIUM CHLORIDE 0.9 % IV SOLN: 250.0000 mL | INTRAVENOUS | Status: DC | PRN

## 2020-04-17 MED ORDER — LABETALOL HCL 5 MG/ML IV SOLN: 10.0000 mg | INTRAVENOUS | Status: AC | PRN

## 2020-04-17 MED ORDER — ASPIRIN 81 MG PO CHEW
81.0000 mg | CHEWABLE_TABLET | ORAL | Status: AC
  Administered 2020-04-17: 81 mg via ORAL
  Filled 2020-04-17: qty 1

## 2020-04-17 MED ORDER — SODIUM CHLORIDE 0.9% FLUSH: 3.0000 mL | Freq: Two times a day (BID) | INTRAVENOUS | Status: DC

## 2020-04-17 MED ORDER — HEPARIN (PORCINE) IN NACL 1000-0.9 UT/500ML-% IV SOLN
INTRAVENOUS | Status: DC | PRN
  Administered 2020-04-17: 500 mL

## 2020-04-17 MED ORDER — SODIUM CHLORIDE 0.9% FLUSH
3.0000 mL | Freq: Two times a day (BID) | INTRAVENOUS | Status: DC
  Administered 2020-04-18: 3 mL via INTRAVENOUS

## 2020-04-17 MED ORDER — HYDRALAZINE HCL 20 MG/ML IJ SOLN: 10.0000 mg | INTRAMUSCULAR | Status: AC | PRN

## 2020-04-17 MED ORDER — APIXABAN 5 MG PO TABS
5.0000 mg | ORAL_TABLET | Freq: Two times a day (BID) | ORAL | Status: DC
  Administered 2020-04-17 – 2020-04-19 (×4): 5 mg via ORAL
  Filled 2020-04-17 (×4): qty 1

## 2020-04-17 MED ORDER — LIDOCAINE HCL (PF) 1 % IJ SOLN
INTRAMUSCULAR | Status: DC | PRN
  Administered 2020-04-17: 2 mL

## 2020-04-17 SURGICAL SUPPLY — 6 items
CATH SWAN GANZ 7F STRAIGHT (CATHETERS) ×1 IMPLANT
GLIDESHEATH SLENDER 7FR .021G (SHEATH) ×1 IMPLANT
PACK CARDIAC CATHETERIZATION (CUSTOM PROCEDURE TRAY) ×2 IMPLANT
PROTECTION STATION PRESSURIZED (MISCELLANEOUS) ×2
STATION PROTECTION PRESSURIZED (MISCELLANEOUS) IMPLANT
TRANSDUCER W/STOPCOCK (MISCELLANEOUS) ×2 IMPLANT

## 2020-04-17 NOTE — Interval H&P Note (Signed)
History and Physical Interval Note:  04/17/2020 3:28 PM  Roberto [name]  has presented today for surgery, with the diagnosis of heart failure.  The various methods of treatment have been discussed with the patient and family. After consideration of risks, benefits and other options for treatment, the patient has consented to  Procedure(s): RIGHT HEART CATH (N/A) as a surgical intervention.  [name] history has been reviewed, patient examined, no change in status, stable for surgery.  I have reviewed the patient's chart and labs.  Questions were answered to the patient's satisfaction.     [name]

## 2020-04-17 NOTE — Consult Note (Signed)
WOC Nurse ostomy follow up Patient receiving care in [facility] 3E14. Stoma type/location: RLQ ileostomy Stomal assessment/size: deferred, supplies must be obtained. Patient is independent in ostomy care. Peristomal assessment: deferred Treatment options for stomal/peristomal skin:  Output  Ostomy pouching: 2pc.  Keep ostomy supplies at bedside: Pouch, [employer] [place] [phone]; skin barriers, [employer] [place] #2; barrier rings, [employer] [place] [phone]; skin prep pads from your stock room.  Helmut Muster, RN, MSN, CWOCN, CNS-BC, pager [phone]

## 2020-04-17 NOTE — Progress Notes (Addendum)
Patient ID: Roberto [name], male   DOB: Sep 08, 1965, 55 y.o.   MRN: [id number]     Advanced Heart Failure Rounding Note  PCP-Cardiologist: Arvilla Meres, MD   Subjective:    Improved diuresis w/ lasix increase, - 4L in UOP. Wt down 9 lb but remains symptomatic, had orthopnea again last night and had to elevate head of bed.   Bump in SCr 2.05>>1.97>>2.39  K 3.4  Objective:   Weight Range: (!) 155.5 kg Body mass index is 52.12 kg/m.   Vital Signs:   Temp:  [98 F (36.7 C)-98.4 F (36.9 C)] 98.3 F (36.8 C) (04/05 0444) Pulse Rate:  [66-71] 70 (04/05 0444) Resp:  [18-20] 20 (04/05 0444) BP: (106-128)/(72-90) 106/73 (04/05 0444) SpO2:  [93 %-95 %] 94 % (04/05 0444) Weight:  [155.5 kg] 155.5 kg (04/05 0444) Last BM Date: 04/16/20  Weight change: Filed Weights   04/15/20 0100 04/16/20 0500 04/17/20 0444  Weight: (!) 159.3 kg (!) 159.3 kg (!) 155.5 kg    Intake/Output:   Intake/Output Summary (Last 24 hours) at 04/17/2020 1015 Last data filed at 04/17/2020 0446 Gross per 24 hour  Intake 412.49 ml  Output 5775 ml  Net -5362.51 ml      Physical Exam    General:  Well appearing super morbidly obese AAM. No respiratory difficulty HEENT: normal Neck: thick neck, JVD 8-9 cm. Carotids 2+ bilat; no bruits. No lymphadenopathy or thyromegaly appreciated. Cor: PMI nondisplaced. Regular rate & rhythm. No rubs, gallops or murmurs. Lungs: clear Abdomen: obese soft, nontender, nondistended. No hepatosplenomegaly. No bruits or masses. Good bowel sounds. + Rt sided ileostomy Extremities: no cyanosis, clubbing, rash, no LEE, + bilateral chronic venous stasis dermatitis  Neuro: alert & oriented x 3, cranial nerves grossly intact. moves all 4 extremities w/o difficulty. Affect pleasant.  Telemetry   NSR 70s Personally reviewed  Labs    CBC No results for input(s): WBC, NEUTROABS, HGB, HCT, MCV, PLT in the last 72 hours. Basic Metabolic Panel Recent Labs    14/97/02 0322  04/16/20 0635 04/17/20 0610  NA 138 137 136  K 3.2* 3.3* 3.4*  CL 100 98 93*  CO2 27 27 30   GLUCOSE 106* 131* 157*  BUN 29* 28* 31*  CREATININE 2.05* 1.97* 2.39*  CALCIUM 8.6* 8.6* 9.0  MG 1.9  --  2.2   Liver Function Tests No results for input(s): AST, ALT, ALKPHOS, BILITOT, PROT, ALBUMIN in the last 72 hours. No results for input(s): LIPASE, AMYLASE in the last 72 hours. Cardiac Enzymes No results for input(s): CKTOTAL, CKMB, CKMBINDEX, TROPONINI in the last 72 hours.  BNP: BNP (last 3 results) Recent Labs    09/13/19 0952 04/13/20 1126  BNP 348.4* 917.0*    ProBNP (last 3 results) No results for input(s): PROBNP in the last 8760 hours.   D-Dimer No results for input(s): DDIMER in the last 72 hours. Hemoglobin A1C No results for input(s): HGBA1C in the last 72 hours. Fasting Lipid Panel No results for input(s): CHOL, HDL, LDLCALC, TRIG, CHOLHDL, LDLDIRECT in the last 72 hours. Thyroid Function Tests No results for input(s): TSH, T4TOTAL, T3FREE, THYROIDAB in the last 72 hours.  Invalid input(s): FREET3  Other results:   Imaging    No results found.   Medications:     Scheduled Medications: . amiodarone  200 mg Oral Daily  . apixaban  5 mg Oral BID  . carvedilol  3.125 mg Oral BID WC  . digoxin  0.125 mg Oral Daily  .  potassium chloride SA  60 mEq Oral TID  . sodium chloride flush  3 mL Intravenous Q12H  . spironolactone  12.5 mg Oral Daily    Infusions: . sodium chloride    . furosemide 120 mg (04/17/20 0931)    PRN Medications: sodium chloride, acetaminophen, ondansetron (ZOFRAN) IV, sodium chloride flush   Assessment/Plan   1. Acute on Chronic Systolic Heart Failure - Echo 84/1/32 - LVEF 15% - TEE 03/2017 LVEF 15-20% - Echo 07/2017 with LVEF 20-25% - Echo 7/20 EF 25-30% - Echo 12/21 EF 25-30% - Now admitted for a/c CHF, NYHA Class III symptoms, w/ volume overload in the setting of recent diuretic dose reduction in the setting of  worsening renal function.  - Volume status much improved with IV diuresis. Wt down another 9 lb today at 342, According to flowsheet baseline weight has ranged from 337-351 but he is still symptomatic w/ orthopnea. SCr rising. Concern for possible low output + volume assessment is difficult given body habitus.  - Will plan for RHC to better assess volume status and check hemodynamics.  - continue to hold Entresto for now w/elevated creatinine and soft BP.  - continue low dose spiro 12.5 mg daily  - Continue digoxin, level < 0.2.  - have held of on SGLT2i w/ ileostomy w/ risk of GU/peritoneal infection but I think this is likely low risk. Will consider adding soon if renal fx stabilizes   2.  AKI on Stage III CKD - prior baseline SCr ~1.6 - recent increase to 1.9>>2.1>2.4  - now w/ acute fluid overload w/ CHF, diuresed w/ IV Lasix but still symptomatic - plan RHC today  - hold Entresto for now - Consider SGLT2i in future if renal fx stabilizes   3. H/o VT - had VT 12/21 in setting of hypokalemia related to metolazone use - no further VT has been detected on most recent device interrogation  - keep K > 4.0 and Mg >2.0  Treat as above - No driving for 6 months (~4/40).  4. H/o Atrial Flutter s/p AFL Ablation  - Maintaining NSR on EKG today -Continueamiodarone 200 mg.  - ContinueEliquis 5 bid.  5. 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 S/p skin grafting 09/05/19. - Abdominal wound healed, currently no plans for reversal of ileostomy due to hernia  6. Hyponatremia - K 3.4 - replete w/ KCl - continue spiro, monitor renal fx    Length of Stay: [address] PA-C  04/17/2020, 10:15 AM  Advanced Heart Failure Team Pager [phone] (M-F; 7a - 5p)  Please contact [facility] for night-coverage after hours (5p -7a ) and weekends on [email or web address]  Patient seen and examined with the above-signed Advanced Practice Provider and/or  Housestaff. I personally reviewed laboratory data, imaging studies and relevant notes. I independently examined the patient and formulated the important aspects of the plan. I have edited the note to reflect any of my changes or salient points. I have personally discussed the plan with the patient and/or family.  Continues with SOB and orthopnea. Good diuresis with IV lasix but weight unchanged. Creatinine slightly better.   General:  Sitting up in bed  No resp difficulty HEENT: normal Neck: supple. JVP appears elevated Carotids 2+ bilat; no bruits. No lymphadenopathy or thryomegaly appreciated. Cor: PMI nondisplaced. Regular rate & rhythm. No rubs, gallops or murmurs. Lungs: clear Abdomen: obese soft, nontender, nondistended. No hepatosplenomegaly. No bruits or masses. Good bowel sounds. + ileostomy  Extremities:  no cyanosis, clubbing, rash, 1+ edema Neuro: alert & orientedx3, cranial nerves grossly intact. moves all 4 extremities w/o difficulty. Affect pleasant  Remains symptomatic but not responding well to IV lasix. Will increase lasix to 120 IV bid and give metolazone 5. If no response plan RHC tomorrow.   Robbie Lis, PA-C  10:15 AM   Patient seen and examined with the above-signed Advanced Practice Provider and/or Housestaff. I personally reviewed laboratory data, imaging studies and relevant notes. I independently examined the patient and formulated the important aspects of the plan. I have edited the note to reflect any of my changes or salient points. I have personally discussed the plan with the patient and/or family.  Good diuresis overnight but remains symptomatic. Creatinine up  General:  Lying in bed  No resp difficulty HEENT: normal Neck: supple. JVP hard to see Carotids 2+ bilat; no bruits. No lymphadenopathy or thryomegaly appreciated. Cor: PMI nondisplaced. Regular rate & rhythm. No rubs, gallops or murmurs. Lungs: clear Abdomen: obese soft, nontender,  nondistended. No hepatosplenomegaly. No bruits or masses. Good bowel sounds. Extremities: no cyanosis, clubbing, rash, tr-1+ edema Neuro: alert & orientedx3, cranial nerves grossly intact. moves all 4 extremities w/o difficulty. Affect pleasant  Good diuresis but remains symptomatic and creatinine up. Will need RHC to better assess volume status and for low output.   Arvilla Meres, MD  2:06 PM

## 2020-04-17 NOTE — H&P (View-Only) (Signed)
Patient ID: Roberto [name], male   DOB: 11/27/1965, 55 y.o.   MRN: [id number]     Advanced Heart Failure Rounding Note  PCP-Cardiologist: Chasen Mendell, MD   Subjective:    Improved diuresis w/ lasix increase, - 4L in UOP. Wt down 9 lb but remains symptomatic, had orthopnea again last night and had to elevate head of bed.   Bump in SCr 2.05>>1.97>>2.39  K 3.4  Objective:   Weight Range: (!) 155.5 kg Body mass index is 52.12 kg/m.   Vital Signs:   Temp:  [98 F (36.7 C)-98.4 F (36.9 C)] 98.3 F (36.8 C) (04/05 0444) Pulse Rate:  [66-71] 70 (04/05 0444) Resp:  [18-20] 20 (04/05 0444) BP: (106-128)/(72-90) 106/73 (04/05 0444) SpO2:  [93 %-95 %] 94 % (04/05 0444) Weight:  [155.5 kg] 155.5 kg (04/05 0444) Last BM Date: 04/16/20  Weight change: Filed Weights   04/15/20 0100 04/16/20 0500 04/17/20 0444  Weight: (!) 159.3 kg (!) 159.3 kg (!) 155.5 kg    Intake/Output:   Intake/Output Summary (Last 24 hours) at 04/17/2020 1015 Last data filed at 04/17/2020 0446 Gross per 24 hour  Intake 412.49 ml  Output 5775 ml  Net -5362.51 ml      Physical Exam    General:  Well appearing super morbidly obese AAM. No respiratory difficulty HEENT: normal Neck: thick neck, JVD 8-9 cm. Carotids 2+ bilat; no bruits. No lymphadenopathy or thyromegaly appreciated. Cor: PMI nondisplaced. Regular rate & rhythm. No rubs, gallops or murmurs. Lungs: clear Abdomen: obese soft, nontender, nondistended. No hepatosplenomegaly. No bruits or masses. Good bowel sounds. + Rt sided ileostomy Extremities: no cyanosis, clubbing, rash, no LEE, + bilateral chronic venous stasis dermatitis  Neuro: alert & oriented x 3, cranial nerves grossly intact. moves all 4 extremities w/o difficulty. Affect pleasant.  Telemetry   NSR 70s Personally reviewed  Labs    CBC No results for input(s): WBC, NEUTROABS, HGB, HCT, MCV, PLT in the last 72 hours. Basic Metabolic Panel Recent Labs    04/15/20 0322  04/16/20 0635 04/17/20 0610  NA 138 137 136  K 3.2* 3.3* 3.4*  CL 100 98 93*  CO2 27 27 30  GLUCOSE 106* 131* 157*  BUN 29* 28* 31*  CREATININE 2.05* 1.97* 2.39*  CALCIUM 8.6* 8.6* 9.0  MG 1.9  --  2.2   Liver Function Tests No results for input(s): AST, ALT, ALKPHOS, BILITOT, PROT, ALBUMIN in the last 72 hours. No results for input(s): LIPASE, AMYLASE in the last 72 hours. Cardiac Enzymes No results for input(s): CKTOTAL, CKMB, CKMBINDEX, TROPONINI in the last 72 hours.  BNP: BNP (last 3 results) Recent Labs    09/13/19 0952 04/13/20 1126  BNP 348.4* 917.0*    ProBNP (last 3 results) No results for input(s): PROBNP in the last 8760 hours.   D-Dimer No results for input(s): DDIMER in the last 72 hours. Hemoglobin A1C No results for input(s): HGBA1C in the last 72 hours. Fasting Lipid Panel No results for input(s): CHOL, HDL, LDLCALC, TRIG, CHOLHDL, LDLDIRECT in the last 72 hours. Thyroid Function Tests No results for input(s): TSH, T4TOTAL, T3FREE, THYROIDAB in the last 72 hours.  Invalid input(s): FREET3  Other results:   Imaging    No results found.   Medications:     Scheduled Medications: . amiodarone  200 mg Oral Daily  . apixaban  5 mg Oral BID  . carvedilol  3.125 mg Oral BID WC  . digoxin  0.125 mg Oral Daily  .   potassium chloride SA  60 mEq Oral TID  . sodium chloride flush  3 mL Intravenous Q12H  . spironolactone  12.5 mg Oral Daily    Infusions: . sodium chloride    . furosemide 120 mg (04/17/20 0931)    PRN Medications: sodium chloride, acetaminophen, ondansetron (ZOFRAN) IV, sodium chloride flush   Assessment/Plan   1. Acute on Chronic Systolic Heart Failure - Echo 84/1/32 - LVEF 15% - TEE 03/2017 LVEF 15-20% - Echo 07/2017 with LVEF 20-25% - Echo 7/20 EF 25-30% - Echo 12/21 EF 25-30% - Now admitted for a/c CHF, NYHA Class III symptoms, w/ volume overload in the setting of recent diuretic dose reduction in the setting of  worsening renal function.  - Volume status much improved with IV diuresis. Wt down another 9 lb today at 342, According to flowsheet baseline weight has ranged from 337-351 but he is still symptomatic w/ orthopnea. SCr rising. Concern for possible low output + volume assessment is difficult given body habitus.  - Will plan for RHC to better assess volume status and check hemodynamics.  - continue to hold Entresto for now w/elevated creatinine and soft BP.  - continue low dose spiro 12.5 mg daily  - Continue digoxin, level < 0.2.  - have held of on SGLT2i w/ ileostomy w/ risk of GU/peritoneal infection but I think this is likely low risk. Will consider adding soon if renal fx stabilizes   2.  AKI on Stage III CKD - prior baseline SCr ~1.6 - recent increase to 1.9>>2.1>2.4  - now w/ acute fluid overload w/ CHF, diuresed w/ IV Lasix but still symptomatic - plan RHC today  - hold Entresto for now - Consider SGLT2i in future if renal fx stabilizes   3. H/o VT - had VT 12/21 in setting of hypokalemia related to metolazone use - no further VT has been detected on most recent device interrogation  - keep K > 4.0 and Mg >2.0  Treat as above - No driving for 6 months (~4/40).  4. H/o Atrial Flutter s/p AFL Ablation  - Maintaining NSR on EKG today -Continueamiodarone 200 mg.  - ContinueEliquis 5 bid.  5. 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 S/p skin grafting 09/05/19. - Abdominal wound healed, currently no plans for reversal of ileostomy due to hernia  6. Hyponatremia - K 3.4 - replete w/ KCl - continue spiro, monitor renal fx    Length of Stay: [address] PA-C  04/17/2020, 10:15 AM  Advanced Heart Failure Team Pager [phone] (M-F; 7a - 5p)  Please contact [facility] for night-coverage after hours (5p -7a ) and weekends on [email or web address]  Patient seen and examined with the above-signed Advanced Practice Provider and/or  Housestaff. I personally reviewed laboratory data, imaging studies and relevant notes. I independently examined the patient and formulated the important aspects of the plan. I have edited the note to reflect any of my changes or salient points. I have personally discussed the plan with the patient and/or family.  Continues with SOB and orthopnea. Good diuresis with IV lasix but weight unchanged. Creatinine slightly better.   General:  Sitting up in bed  No resp difficulty HEENT: normal Neck: supple. JVP appears elevated Carotids 2+ bilat; no bruits. No lymphadenopathy or thryomegaly appreciated. Cor: PMI nondisplaced. Regular rate & rhythm. No rubs, gallops or murmurs. Lungs: clear Abdomen: obese soft, nontender, nondistended. No hepatosplenomegaly. No bruits or masses. Good bowel sounds. + ileostomy  Extremities:  no cyanosis, clubbing, rash, 1+ edema Neuro: alert & orientedx3, cranial nerves grossly intact. moves all 4 extremities w/o difficulty. Affect pleasant  Remains symptomatic but not responding well to IV lasix. Will increase lasix to 120 IV bid and give metolazone 5. If no response plan RHC tomorrow.   Robbie Lis, PA-C  10:15 AM   Patient seen and examined with the above-signed Advanced Practice Provider and/or Housestaff. I personally reviewed laboratory data, imaging studies and relevant notes. I independently examined the patient and formulated the important aspects of the plan. I have edited the note to reflect any of my changes or salient points. I have personally discussed the plan with the patient and/or family.  Good diuresis overnight but remains symptomatic. Creatinine up  General:  Lying in bed  No resp difficulty HEENT: normal Neck: supple. JVP hard to see Carotids 2+ bilat; no bruits. No lymphadenopathy or thryomegaly appreciated. Cor: PMI nondisplaced. Regular rate & rhythm. No rubs, gallops or murmurs. Lungs: clear Abdomen: obese soft, nontender,  nondistended. No hepatosplenomegaly. No bruits or masses. Good bowel sounds. Extremities: no cyanosis, clubbing, rash, tr-1+ edema Neuro: alert & orientedx3, cranial nerves grossly intact. moves all 4 extremities w/o difficulty. Affect pleasant  Good diuresis but remains symptomatic and creatinine up. Will need RHC to better assess volume status and for low output.   Arvilla Meres, MD  2:06 PM

## 2020-04-17 NOTE — Progress Notes (Signed)
PROGRESS NOTE    Roberto [name]  WUJ:[id number] DOB: July 21, 1965 DOA: 04/13/2020 PCP: [name], MD    Brief Narrative:  55 year old gentleman with history of combined heart failure, status post AICD, paroxysmal A. fib on Eliquis, DVT and PE, CKD stage IIIa, diverticulitis status post ileostomy and morbid obesity presented with shortness of breath.  Followed by heart failure clinic, recently adjusted medications.  Short of breath for the last few days.   Assessment & Plan:   Principal Problem:   Acute on chronic combined systolic and diastolic CHF (congestive heart failure) (HCC) Active Problems:   Morbid obesity with BMI of 50.0-59.9, adult (HCC)   Paroxysmal SVT (supraventricular tachycardia) (HCC)   Lupus anticoagulant positive   Chronic anticoagulation   Typical atrial flutter s/p catheter ablation   History of LV (left ventricular) mural thrombus   History of DVT (deep vein thrombosis)   S/P implantation of automatic cardioverter/defibrillator (AICD)  Acute on chronic combined congestive heart failure: Patient admitted with significant fluid overload.  Followed by advanced heart failure team. Patient remains on high-dose IV Lasix, potassium supplements.   14 L negative balance.  4 L urine over last 24 hours. Last ejection fraction 30%.  Followed by advanced heart failure team. Sherryll Burger on hold due to renal dysfunction. Intake and output monitoring.  Daily weight. Aggressive electrolyte replacement. Cardiology planning for heart cath.  Acute kidney injury superimposed on chronic kidney disease stage IIIa: Reported baseline creatinine about 1.3-1.4.  Presented with creatinine of 2.1.   Creatinine slightly worse today.  Urine output is 4 L, unlikely any obstruction.  Will check renal ultrasound if possible with his body habitus today.  History of DVT and PE: On Eliquis and therapeutic.  History of ventricular tachycardia status post AICD: Paroxysmal A. fib.  On Coreg,  digoxin, amiodarone and Eliquis.  Aggressive replacement of electrolytes.  On high-dose potassium replacement.  History of diverticulitis status post resection with ileostomy: Ostomy care.  Stable.  Patient remains in the hospital for ongoing management of heart failure.  DVT prophylaxis:  apixaban (ELIQUIS) tablet 5 mg   Code Status: Full code Family Communication: None at the bedside, patient communicating. Disposition Plan: Status is: Inpatient  Remains inpatient appropriate because:IV treatments appropriate due to intensity of illness or inability to take PO and Inpatient level of care appropriate due to severity of illness   Dispo: The patient is from: Home              Anticipated d/c is to: Home              Patient currently is not medically stable to d/c.   Difficult to place patient No         Consultants:   Cardiology  Procedures:   None  Antimicrobials:   None   Subjective: Seen and examined.  Denies any complaints now.  He had some orthopnea last night.  Asymptomatic at rest.  Objective: Vitals:   04/16/20 1125 04/16/20 1636 04/16/20 2140 04/17/20 0444  BP: 128/90 108/72 108/86 106/73  Pulse: 69 71 66 70  Resp: 18 18 20 20   Temp: 98 F (36.7 C) 98.1 F (36.7 C) 98.4 F (36.9 C) 98.3 F (36.8 C)  TempSrc: Oral Oral Oral Oral  SpO2: 93% 95% 95% 94%  Weight:    (!) 155.5 kg  Height:  5\' 8"  (1.727 m)      Intake/Output Summary (Last 24 hours) at 04/17/2020 1032 Last data filed at 04/17/2020 0446 Gross  per 24 hour  Intake 412.49 ml  Output 5775 ml  Net -5362.51 ml   Filed Weights   04/15/20 0100 04/16/20 0500 04/17/20 0444  Weight: (!) 159.3 kg (!) 159.3 kg (!) 155.5 kg    Examination:  General exam: Appears calm and comfortable. Morbidly obese gentleman laying in room air.  Not in any distress at rest. Respiratory system: Difficult to auscultate.  Mostly clear bilateral. Cardiovascular system: S1 & S2 heard, RRR.  Trace edema right  leg.  Non-pedal edema left leg where he had a DVT and chronic edema.  Mostly chronic changes. Gastrointestinal system: Soft.  Distended but nontender.  Midline surgical scar healthy.  Colostomy right lower quadrant. Central nervous system: Alert and oriented. No focal neurological deficits. Extremities: Symmetric 5 x 5 power. Skin: No rashes, lesions or ulcers Psychiatry: Judgement and insight appear normal. Mood & affect appropriate.     Data Reviewed: I have personally reviewed following labs and imaging studies  CBC: Recent Labs  Lab 04/13/20 1126  WBC 9.3  HGB 15.6  HCT 47.7  MCV 92.3  PLT 164   Basic Metabolic Panel: Recent Labs  Lab 04/13/20 1126 04/14/20 0413 04/15/20 0322 04/16/20 0635 04/17/20 0610  NA 135 135 138 137 136  K 3.5 3.2* 3.2* 3.3* 3.4*  CL 101 97* 100 98 93*  CO2 24 26 27 27 30   GLUCOSE 127* 110* 106* 131* 157*  BUN 25* 29* 29* 28* 31*  CREATININE 2.12* 2.06* 2.05* 1.97* 2.39*  CALCIUM 9.1 8.9 8.6* 8.6* 9.0  MG  --  1.9 1.9  --  2.2   GFR: Estimated Creatinine Clearance: 51.6 mL/min (A) (by C-G formula based on SCr of 2.39 mg/dL (H)). Liver Function Tests: No results for input(s): AST, ALT, ALKPHOS, BILITOT, PROT, ALBUMIN in the last 168 hours. No results for input(s): LIPASE, AMYLASE in the last 168 hours. No results for input(s): AMMONIA in the last 168 hours. Coagulation Profile: No results for input(s): INR, PROTIME in the last 168 hours. Cardiac Enzymes: No results for input(s): CKTOTAL, CKMB, CKMBINDEX, TROPONINI in the last 168 hours. BNP (last 3 results) No results for input(s): PROBNP in the last 8760 hours. HbA1C: No results for input(s): HGBA1C in the last 72 hours. CBG: No results for input(s): GLUCAP in the last 168 hours. Lipid Profile: No results for input(s): CHOL, HDL, LDLCALC, TRIG, CHOLHDL, LDLDIRECT in the last 72 hours. Thyroid Function Tests: No results for input(s): TSH, T4TOTAL, FREET4, T3FREE, THYROIDAB in the  last 72 hours. Anemia Panel: No results for input(s): VITAMINB12, FOLATE, FERRITIN, TIBC, IRON, RETICCTPCT in the last 72 hours. Sepsis Labs: No results for input(s): PROCALCITON, LATICACIDVEN in the last 168 hours.  Recent Results (from the past 240 hour(s))  SARS CORONAVIRUS 2 (TAT 6-24 HRS) Nasopharyngeal Nasopharyngeal Swab     Status: None   Collection Time: 04/13/20  1:01 PM   Specimen: Nasopharyngeal Swab  Result Value Ref Range Status   SARS Coronavirus 2 NEGATIVE NEGATIVE Final    Comment: (NOTE) SARS-CoV-2 target nucleic acids are NOT DETECTED.  The SARS-CoV-2 RNA is generally detectable in upper and lower respiratory specimens during the acute phase of infection. Negative results do not preclude SARS-CoV-2 infection, do not rule out co-infections with other pathogens, and should not be used as the sole basis for treatment or other patient management decisions. Negative results must be combined with clinical observations, patient history, and epidemiological information. The expected result is Negative.  Fact Sheet for Patients: 06/13/20  Fact Sheet for Healthcare Providers: quierodirigir.com  This test is not yet approved or cleared by the [place] FDA and  has been authorized for detection and/or diagnosis of SARS-CoV-2 by FDA under an Emergency Use Authorization (EUA). This EUA will remain  in effect (meaning this test can be used) for the duration of the COVID-19 declaration under Se ction 564(b)(1) of the Act, 21 U.S.C. section 360bbb-3(b)(1), unless the authorization is terminated or revoked sooner.  Performed at [facility], [address] [place], [place] [address]          Radiology Studies: No results found.      Scheduled Meds: . amiodarone  200 mg Oral Daily  . apixaban  5 mg Oral BID  . carvedilol  3.125 mg Oral BID WC  . digoxin  0.125 mg Oral Daily  . potassium  chloride SA  60 mEq Oral TID  . sodium chloride flush  3 mL Intravenous Q12H  . spironolactone  12.5 mg Oral Daily   Continuous Infusions: . sodium chloride    . furosemide 120 mg (04/17/20 0931)     LOS: 4 days    Time spent: 25 minutes    Dorcas Carrow, MD [facility] Pager [phone]

## 2020-04-18 ENCOUNTER — Encounter (HOSPITAL_COMMUNITY): Payer: Self-pay | Admitting: Internal Medicine

## 2020-04-18 DIAGNOSIS — E860 Dehydration: Secondary | ICD-10-CM

## 2020-04-18 DIAGNOSIS — I5043 Acute on chronic combined systolic (congestive) and diastolic (congestive) heart failure: Secondary | ICD-10-CM | POA: Diagnosis not present

## 2020-04-18 DIAGNOSIS — I509 Heart failure, unspecified: Secondary | ICD-10-CM

## 2020-04-18 LAB — POCT I-STAT EG7
Acid-Base Excess: 6 mmol/L — ABNORMAL HIGH (ref 0.0–2.0)
Bicarbonate: 31.7 mmol/L — ABNORMAL HIGH (ref 20.0–28.0)
Calcium, Ion: 0.85 mmol/L — CL (ref 1.15–1.40)
HCT: 51 % (ref 39.0–52.0)
Hemoglobin: 17.3 g/dL — ABNORMAL HIGH (ref 13.0–17.0)
O2 Saturation: 68 %
Potassium: 2.7 mmol/L — CL (ref 3.5–5.1)
Sodium: 143 mmol/L (ref 135–145)
TCO2: 33 mmol/L — ABNORMAL HIGH (ref 22–32)
pCO2, Ven: 46.2 mmHg (ref 44.0–60.0)
pH, Ven: 7.445 — ABNORMAL HIGH (ref 7.250–7.430)
pO2, Ven: 34 mmHg (ref 32.0–45.0)

## 2020-04-18 LAB — BASIC METABOLIC PANEL
Anion gap: 11 (ref 5–15)
BUN: 39 mg/dL — ABNORMAL HIGH (ref 6–20)
CO2: 29 mmol/L (ref 22–32)
Calcium: 8.9 mg/dL (ref 8.9–10.3)
Chloride: 94 mmol/L — ABNORMAL LOW (ref 98–111)
Creatinine, Ser: 2.66 mg/dL — ABNORMAL HIGH (ref 0.61–1.24)
GFR, Estimated: 28 mL/min — ABNORMAL LOW (ref >60)
Glucose, Bld: 132 mg/dL — ABNORMAL HIGH (ref 70–99)
Potassium: 3.5 mmol/L (ref 3.5–5.1)
Sodium: 134 mmol/L — ABNORMAL LOW (ref 135–145)

## 2020-04-18 LAB — MAGNESIUM: Magnesium: 2.3 mg/dL (ref 1.7–2.4)

## 2020-04-18 NOTE — Progress Notes (Addendum)
Patient ID: Roberto [name], male   DOB: 1965/09/01, 55 y.o.   MRN: [id number]     Advanced Heart Failure Rounding Note  PCP-Cardiologist: Arvilla Meres, MD   Subjective:    RHC yesterday showed low filling pressures and normal cardiac output.   Diuretics held. SCr/BUN continues to trend up. SCr 1.97>>2.39>>2.66 BUN 28>>31>>39   Hypokalemic yesterday, 3.2>>3.5 today.   SBPs remain soft in 90s   Feels ok today. No complaints. Denies dizziness.   Findings:  RA = 2 RV = 24/1 PA = 25/3 (15) PCW = 7 Fick cardiac output/index = 6.0/23 Thermo CO/CI = 6.1/2.4 PVR = 0.7 WU FA sat = 95% PA sat = 67%, 68%  Assessment: 1. Low filling pressures 2. Normal cardiac output   Objective:   Weight Range: (!) 154.7 kg Body mass index is 51.86 kg/m.   Vital Signs:   Temp:  [97.8 F (36.6 C)-98.6 F (37 C)] 97.8 F (36.6 C) (04/06 0723) Pulse Rate:  [67-80] 69 (04/06 0723) Resp:  [17-22] 18 (04/06 0723) BP: (89-123)/(50-86) 93/62 (04/06 0723) SpO2:  [93 %-97 %] 94 % (04/06 0723) Weight:  [154.7 kg] 154.7 kg (04/06 0521) Last BM Date: 04/17/20  Weight change: Filed Weights   04/16/20 0500 04/17/20 0444 04/18/20 0521  Weight: (!) 159.3 kg (!) 155.5 kg (!) 154.7 kg    Intake/Output:   Intake/Output Summary (Last 24 hours) at 04/18/2020 0843 Last data filed at 04/18/2020 0840 Gross per 24 hour  Intake 956 ml  Output 800 ml  Net 156 ml      Physical Exam    General:  morbidly obese, sitting up in bed. No respiratory difficulty HEENT: normal Neck: thick neck, no JVD. Carotids 2+ bilat; no bruits. No lymphadenopathy or thyromegaly appreciated. Cor: PMI nondisplaced. Regular rate & rhythm. No rubs, gallops or murmurs. Lungs: clear Abdomen: obese soft, nontender, nondistended. No hepatosplenomegaly. No bruits or masses. Good bowel sounds. + Rt sided ileostomy Extremities: no cyanosis, clubbing, rash, no LEE, + bilateral chronic venous stasis dermatitis  Neuro: alert &  oriented x 3, cranial nerves grossly intact. moves all 4 extremities w/o difficulty. Affect pleasant.  Telemetry   NSR 70s Personally reviewed  Labs    CBC Recent Labs    04/17/20 1621  HGB 18.7*  17.3*  HCT 55.0*  51.0   Basic Metabolic Panel Recent Labs    85/63/14 0610 04/17/20 1621 04/18/20 0412  NA 136 140  143 134*  K 3.4* 3.2*  2.7* 3.5  CL 93*  --  94*  CO2 30  --  29  GLUCOSE 157*  --  132*  BUN 31*  --  39*  CREATININE 2.39*  --  2.66*  CALCIUM 9.0  --  8.9  MG 2.2  --  2.3   Liver Function Tests No results for input(s): AST, ALT, ALKPHOS, BILITOT, PROT, ALBUMIN in the last 72 hours. No results for input(s): LIPASE, AMYLASE in the last 72 hours. Cardiac Enzymes No results for input(s): CKTOTAL, CKMB, CKMBINDEX, TROPONINI in the last 72 hours.  BNP: BNP (last 3 results) Recent Labs    09/13/19 0952 04/13/20 1126  BNP 348.4* 917.0*    ProBNP (last 3 results) No results for input(s): PROBNP in the last 8760 hours.   D-Dimer No results for input(s): DDIMER in the last 72 hours. Hemoglobin A1C No results for input(s): HGBA1C in the last 72 hours. Fasting Lipid Panel No results for input(s): CHOL, HDL, LDLCALC, TRIG, CHOLHDL, LDLDIRECT in  the last 72 hours. Thyroid Function Tests No results for input(s): TSH, T4TOTAL, T3FREE, THYROIDAB in the last 72 hours.  Invalid input(s): FREET3  Other results:   Imaging    CARDIAC CATHETERIZATION  Result Date: 04/17/2020 Findings: RA = 2 RV = 24/1 PA = 25/3 (15) PCW = 7 Fick cardiac output/index = 6.0/23 Thermo CO/CI = 6.1/2.4 PVR = 0.7 WU FA sat = 95% PA sat = 67%, 68% Assessment: 1. Low filling pressures 2. Normal cardiac output Plan/Discussion: He is dry. Hold diuretics. Arvilla Meres, MD 4:28 PM   US RENAL  Result Date: 04/17/2020 CLINICAL DATA:  Acute renal failure EXAM: RENAL / URINARY TRACT ULTRASOUND COMPLETE COMPARISON:  CT 01/19/2020 FINDINGS: Right Kidney: Renal measurements: 11.4 x 5.2  x 4.1 cm = volume: 125 mL. Echogenicity within normal limits. No mass or hydronephrosis visualized. Left Kidney: Renal measurements: 13.4 x 4.3 x 4.8 cm = volume: 145 mL. Echogenicity within normal limits. No mass or hydronephrosis visualized. Bladder: Appears normal for degree of bladder distention. Other: None. IMPRESSION: Normal ultrasound appearance of the kidneys and bladder. No evidence of hydronephrosis. Electronically Signed   By: Burman Nieves M.D.   On: 04/17/2020 20:31     Medications:     Scheduled Medications: . amiodarone  200 mg Oral Daily  . apixaban  5 mg Oral BID  . carvedilol  3.125 mg Oral BID WC  . digoxin  0.125 mg Oral Daily  . potassium chloride SA  60 mEq Oral TID  . sodium chloride flush  3 mL Intravenous Q12H  . sodium chloride flush  3 mL Intravenous Q12H  . sodium chloride flush  3 mL Intravenous Q12H  . spironolactone  12.5 mg Oral Daily    Infusions: . sodium chloride    . sodium chloride      PRN Medications: sodium chloride, sodium chloride, acetaminophen, ondansetron (ZOFRAN) IV, sodium chloride flush, sodium chloride flush   Assessment/Plan   1. Acute on Chronic Systolic Heart Failure - Echo 66/2/94 - LVEF 15% - TEE 03/2017 LVEF 15-20% - Echo 07/2017 with LVEF 20-25% - Echo 7/20 EF 25-30% - Echo 12/21 EF 25-30% - Now admitted for a/c CHF, NYHA Class III symptoms, w/ volume overload in the setting of recent diuretic dose reduction in the setting of worsening renal function.  - Volume status much improved with IV diuresis. Had bump in SCr - RHC yesterday showed low filling pressures and normal cardiac output.  - He is dry. Continue to hold lasix. Will also hold spiro w/ rising SCr and low BP  - May need to give fluids back w/ gentle IV hydration  - continue to hold Entresto for now w/ elevated creatinine and soft BP.  - Continue digoxin, level < 0.2.  - have held off on SGLT2i w/ ileostomy w/ risk of GU/peritoneal infection but I think this  is likely low risk. Will consider adding if renal fx stabilizes   2.  AKI on Stage III CKD - prior baseline SCr ~1.6 - recent increase to 1.9>>2.1>2.4>2.7  - admitted w/ acute fluid overload and diuresed, now dry  - low filling pressures on RHC - Hold Lasix, spiro and Entresto - may need gentle IVF hydration  - Consider SGLT2i in future if renal fx stabilizes   3. H/o VT - had VT 12/21 in setting of hypokalemia related to metolazone use - no further VT has been detected on most recent device interrogation  - keep K > 4.0 and Mg >2.0  Treat as above - No driving for 6 months (~7/51).  4. H/o Atrial Flutter s/p AFL Ablation  - Maintaining NSR on EKG today -Continueamiodarone 200 mg.  - ContinueEliquis 5 bid.  5. 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 S/p skin grafting 09/05/19. - Abdominal wound healed, currently no plans for reversal of ileostomy due to hernia  6. Hyponatremia - K 3.5 - replete w/ KCl - holding spiro per above    Length of Stay: [address], PA-C  04/18/2020, 8:43 AM  Advanced Heart Failure Team Pager [phone] (M-F; 7a - 5p)  Please contact [facility] for night-coverage after hours (5p -7a ) and weekends on [email or web address]   Patient seen and examined with the above-signed Advanced Practice Provider and/or Housestaff. I personally reviewed laboratory data, imaging studies and relevant notes. I independently examined the patient and formulated the important aspects of the plan. I have edited the note to reflect any of my changes or salient points. I have personally discussed the plan with the patient and/or family.  Feels better. No CP, sob, orthopnea or PND.   RHC yesterday with very low filling pressures. Normal output. Creatinine up  General:  Sitting up in bed  No resp difficulty HEENT: normal Neck: supple. no JVD. Carotids 2+ bilat; no bruits. No lymphadenopathy or thryomegaly  appreciated. Cor: PMI nondisplaced. Regular rate & rhythm. No rubs, gallops or murmurs. Lungs: clear Abdomen: obese soft, nontender, nondistended. No hepatosplenomegaly. No bruits or masses. Good bowel sounds. + ileostomy Extremities: no cyanosis, clubbing, rash, edema Neuro: alert & orientedx3, cranial nerves grossly intact. moves all 4 extremities w/o difficulty. Affect pleasant  Creatinine up slightly this am in setting of aggressive diuresis. Continue to hold diuretics and spiro.   From HF point of view I would be fine with having him go home today and restart home meds tomorrow. If primary team prefers to watch renal function one more day that would be fine as well. Encourage po intake. No need for IVF unless feels orthostatic.   Arvilla Meres, MD  9:21 AM

## 2020-04-18 NOTE — Progress Notes (Signed)
[facility]                                                                              Patient Demographics  Roberto [name], is a 55 y.o. male, DOB - Apr 19, 1965, [id number]  Admit date - 04/13/2020   Admitting Physician Clydie Braun, MD  Outpatient Primary MD for the patient is [name], MD  Outpatient specialists:   LOS - 5  days   Medical records reviewed and are as summarized below:    Chief Complaint  Patient presents with  . Shortness of Breath       Brief summary   Patient is a 55 year old male with history of systolic and diastolic CHF, status post AICD, paroxysmal A. fib on Eliquis, DVT and PE, CKD stage 3a, history of diverticulitis status post ileostomy, morbid obesity presented with shortness of breath.  On the day of admission, patient felt his heart racing transiently, having difficulty breathing even walking 200 feet which was unusual for him.  BNP 917.  Chest x-ray significant for cardiomegaly with pulmonary vascular congestion.  Patient was found to have significant fluid overload.  In ED, patient received Lasix 40 mg IV, cardiology was consulted.   Assessment & Plan    Principal Problem:   Acute on chronic combined systolic and diastolic CHF (congestive heart failure) (HCC) -Patient presented with fluid overload, elevated BNP, chest x-ray with pulmonary vascular congestion in the setting of recent diuretic dose reduction, worsening renal function -Patient was placed on IV diuresis, CHF team/cardiology was consulted. -2D echo 12/2019 had shown EF of 25 to 30%, grade 3 DD -Negative balance of 13.4 L, weight down from 352.3 on admission-> 341 -Continue to hold Entresto with elevated creatinine, borderline BP.   -Creatinine trended up to 2.6 today, hold Lasix, Aldactone -Patient underwent right heart cath on 4/5 which showed low filling pressures, normal cardiac output, recommended hold diuretics  Active Problems: Acute kidney  injury on CKD stage IIIa -Baseline creatinine ~1.6, presented with creatinine of 2.1, trended up due to diuresis -Creatinine 2.6, Lasix held, continue to hold Entresto, spironolactone   History of VT (HCC), AICD -History of VT in 12/2019 in the setting of hypokalemia -Keep potassium above 4, magnesium above 2 -Per cardiology no driving for 6 months, till 06/2020  History of atrial flutter status post ablation -continue amiodarone, rate controlled -Continue eliquis, maintaining NSR  History of DVT, PE -Currently on Eliquis  History of diverticulitis status post resection, ileostomy -Continue ostomy care, no acute issues  Morbid obesity Estimated body mass index is 51.86 kg/m as calculated from the following:   Height as of this encounter: 5\' 8"  (1.727 m).   Weight as of this encounter: 154.7 kg.  Code Status: Full CODE STATUS DVT Prophylaxis:   apixaban (ELIQUIS) tablet 5 mg   Level of Care: Level of care: Telemetry Cardiac Family Communication: Discussed all imaging results, lab results, explained to the patient    Disposition Plan:     Status is: Inpatient  Remains inpatient appropriate because:Inpatient level of care appropriate due to severity of illness   Dispo:  Patient From: Home  Planned  Disposition: Home  Medically stable for discharge: No creatinine trended up to 2.6 today.  Discussed in detail with the patient, he wants to stay overnight to ensure that his creatinine will be trending down or stable in a.m before he is discharged.        Time Spent in minutes   35 minutes  Procedures:  Right heart catheterization  Consultants:   Cardiology  Antimicrobials:   Anti-infectives (From admission, onward)   None          Medications  Scheduled Meds: . amiodarone  200 mg Oral Daily  . apixaban  5 mg Oral BID  . carvedilol  3.125 mg Oral BID WC  . digoxin  0.125 mg Oral Daily  . potassium chloride SA  60 mEq Oral TID  . sodium chloride flush   3 mL Intravenous Q12H  . sodium chloride flush  3 mL Intravenous Q12H  . sodium chloride flush  3 mL Intravenous Q12H   Continuous Infusions: . sodium chloride    . sodium chloride     PRN Meds:.sodium chloride, sodium chloride, acetaminophen, ondansetron (ZOFRAN) IV, sodium chloride flush, sodium chloride flush      Subjective:   Roberto [name] was seen and examined today.  *No acute complaints, feeling close to his baseline, shortness of breath has significantly improved, no chest pressure.  No fevers.  Patient denies dizziness, , abdominal pain, N/V/D/C, new weakness, numbess, tingling. No acute events overnight.  BP soft.  Objective:   Vitals:   04/17/20 1952 04/18/20 0521 04/18/20 0723 04/18/20 1141  BP: 123/86 (!) 89/50 93/62 103/72  Pulse: 73 80 69 72  Resp: 18 18 18 19   Temp: 98.6 F (37 C) 97.8 F (36.6 C) 97.8 F (36.6 C) 98.1 F (36.7 C)  TempSrc: Oral Oral Oral Oral  SpO2: 97% 95% 94% 95%  Weight:  (!) 154.7 kg    Height:        Intake/Output Summary (Last 24 hours) at 04/18/2020 1222 Last data filed at 04/18/2020 0840 Gross per 24 hour  Intake 956 ml  Output 800 ml  Net 156 ml     Wt Readings from Last 3 Encounters:  04/18/20 (!) 154.7 kg  03/29/20 (!) 162.6 kg  03/08/20 (!) 164.2 kg     Exam  General: Alert and oriented x 3, NAD  Cardiovascular: S1 S2 auscultated, no murmurs, RRR  Respiratory: Clear to auscultation bilaterally, no wheezing or rales  Gastrointestinal: Soft, nontender, nondistended, + bowel sounds, colostomy RLL  Ext: chronic nonpitting edema left leg, trace edema right leg   Neuro: no new deficits  Musculoskeletal: No digital cyanosis, clubbing  Skin: No rashes  Psych: Normal affect and demeanor, alert and oriented x3    Data Reviewed:  I have personally reviewed following labs and imaging studies  Micro Results Recent Results (from the past 240 hour(s))  SARS CORONAVIRUS 2 (TAT 6-24 HRS) Nasopharyngeal  Nasopharyngeal Swab     Status: None   Collection Time: 04/13/20  1:01 PM   Specimen: Nasopharyngeal Swab  Result Value Ref Range Status   SARS Coronavirus 2 NEGATIVE NEGATIVE Final    Comment: (NOTE) SARS-CoV-2 target nucleic acids are NOT DETECTED.  The SARS-CoV-2 RNA is generally detectable in upper and lower respiratory specimens during the acute phase of infection. Negative results do not preclude SARS-CoV-2 infection, do not rule out co-infections with other pathogens, and should not be used as the sole basis for treatment or other patient management decisions. Negative results  must be combined with clinical observations, patient history, and epidemiological information. The expected result is Negative.  Fact Sheet for Patients: HairSlick.no  Fact Sheet for Healthcare Providers: quierodirigir.com  This test is not yet approved or cleared by the [place] FDA and  has been authorized for detection and/or diagnosis of SARS-CoV-2 by FDA under an Emergency Use Authorization (EUA). This EUA will remain  in effect (meaning this test can be used) for the duration of the COVID-19 declaration under Se ction 564(b)(1) of the Act, 21 U.S.C. section 360bbb-3(b)(1), unless the authorization is terminated or revoked sooner.  Performed at [facility], [address] [place], [place] [address]     Radiology Reports DG Chest 2 View  Result Date: 04/13/2020 CLINICAL DATA:  Shortness of breath. EXAM: CHEST - 2 VIEW COMPARISON:  12/16/2019 FINDINGS: A single lead ICD remains in place. The cardiac silhouette remains mildly enlarged. Pulmonary vascular congestion is similar to the prior study. No confluent airspace opacity, pleural effusion, pneumothorax is identified. No acute osseous abnormality is seen. IMPRESSION: Cardiomegaly and pulmonary vascular congestion. Electronically Signed   By: Sebastian Ache M.D.   On: 04/13/2020  11:48   CARDIAC CATHETERIZATION  Result Date: 04/17/2020 Findings: RA = 2 RV = 24/1 PA = 25/3 (15) PCW = 7 Fick cardiac output/index = 6.0/23 Thermo CO/CI = 6.1/2.4 PVR = 0.7 WU FA sat = 95% PA sat = 67%, 68% Assessment: 1. Low filling pressures 2. Normal cardiac output Plan/Discussion: He is dry. Hold diuretics. Arvilla Meres, MD 4:28 PM   US RENAL  Result Date: 04/17/2020 CLINICAL DATA:  Acute renal failure EXAM: RENAL / URINARY TRACT ULTRASOUND COMPLETE COMPARISON:  CT 01/19/2020 FINDINGS: Right Kidney: Renal measurements: 11.4 x 5.2 x 4.1 cm = volume: 125 mL. Echogenicity within normal limits. No mass or hydronephrosis visualized. Left Kidney: Renal measurements: 13.4 x 4.3 x 4.8 cm = volume: 145 mL. Echogenicity within normal limits. No mass or hydronephrosis visualized. Bladder: Appears normal for degree of bladder distention. Other: None. IMPRESSION: Normal ultrasound appearance of the kidneys and bladder. No evidence of hydronephrosis. Electronically Signed   By: Burman Nieves M.D.   On: 04/17/2020 20:31    Lab Data:  CBC: Recent Labs  Lab 04/13/20 1126 04/17/20 1621  WBC 9.3  --   HGB 15.6 18.7*  17.3*  HCT 47.7 55.0*  51.0  MCV 92.3  --   PLT 164  --    Basic Metabolic Panel: Recent Labs  Lab 04/14/20 0413 04/15/20 0322 04/16/20 0635 04/17/20 0610 04/17/20 1621 04/18/20 0412  NA 135 138 137 136 140  143 134*  K 3.2* 3.2* 3.3* 3.4* 3.2*  2.7* 3.5  CL 97* 100 98 93*  --  94*  CO2 26 27 27 30   --  29  GLUCOSE 110* 106* 131* 157*  --  132*  BUN 29* 29* 28* 31*  --  39*  CREATININE 2.06* 2.05* 1.97* 2.39*  --  2.66*  CALCIUM 8.9 8.6* 8.6* 9.0  --  8.9  MG 1.9 1.9  --  2.2  --  2.3   GFR: Estimated Creatinine Clearance: 46.2 mL/min (A) (by C-G formula based on SCr of 2.66 mg/dL (H)). Liver Function Tests: No results for input(s): AST, ALT, ALKPHOS, BILITOT, PROT, ALBUMIN in the last 168 hours. No results for input(s): LIPASE, AMYLASE in the last 168  hours. No results for input(s): AMMONIA in the last 168 hours. Coagulation Profile: No results for input(s): INR, PROTIME  in the last 168 hours. Cardiac Enzymes: No results for input(s): CKTOTAL, CKMB, CKMBINDEX, TROPONINI in the last 168 hours. BNP (last 3 results) No results for input(s): PROBNP in the last 8760 hours. HbA1C: No results for input(s): HGBA1C in the last 72 hours. CBG: No results for input(s): GLUCAP in the last 168 hours. Lipid Profile: No results for input(s): CHOL, HDL, LDLCALC, TRIG, CHOLHDL, LDLDIRECT in the last 72 hours. Thyroid Function Tests: No results for input(s): TSH, T4TOTAL, FREET4, T3FREE, THYROIDAB in the last 72 hours. Anemia Panel: No results for input(s): VITAMINB12, FOLATE, FERRITIN, TIBC, IRON, RETICCTPCT in the last 72 hours. Urine analysis:    Component Value Date/Time   COLORURINE YELLOW 12/19/2018 2038   APPEARANCEUR CLEAR 12/19/2018 2038   LABSPEC >1.030 (H) 12/19/2018 2038   LABSPEC 1.025 03/18/2017 1337   PHURINE 5.5 12/19/2018 2038   GLUCOSEU NEGATIVE 12/19/2018 2038   HGBUR MODERATE (A) 12/19/2018 2038   BILIRUBINUR SMALL (A) 12/19/2018 2038   BILIRUBINUR negative 03/18/2017 1337   BILIRUBINUR neg 12/17/2015 0944   KETONESUR NEGATIVE 12/19/2018 2038   PROTEINUR >300 (A) 12/19/2018 2038   UROBILINOGEN negative 12/17/2015 0944   UROBILINOGEN 0.2 12/16/2011 1208   NITRITE NEGATIVE 12/19/2018 2038   LEUKOCYTESUR NEGATIVE 12/19/2018 2038     Dacotah Cabello M.D. [facility] 04/18/2020, 12:22 PM  Available via Epic secure chat 7am-7pm After 7 pm, please refer to night coverage provider listed on amion.

## 2020-04-19 ENCOUNTER — Other Ambulatory Visit (HOSPITAL_COMMUNITY): Payer: Self-pay

## 2020-04-19 LAB — BASIC METABOLIC PANEL
Anion gap: 10 (ref 5–15)
BUN: 40 mg/dL — ABNORMAL HIGH (ref 6–20)
CO2: 27 mmol/L (ref 22–32)
Calcium: 8.9 mg/dL (ref 8.9–10.3)
Chloride: 97 mmol/L — ABNORMAL LOW (ref 98–111)
Creatinine, Ser: 2.4 mg/dL — ABNORMAL HIGH (ref 0.61–1.24)
GFR, Estimated: 31 mL/min — ABNORMAL LOW (ref >60)
Glucose, Bld: 136 mg/dL — ABNORMAL HIGH (ref 70–99)
Potassium: 3.7 mmol/L (ref 3.5–5.1)
Sodium: 134 mmol/L — ABNORMAL LOW (ref 135–145)

## 2020-04-19 MED ORDER — TORSEMIDE 20 MG PO TABS
40.0000 mg | ORAL_TABLET | Freq: Every morning | ORAL | 2 refills | Status: DC
Start: 2020-04-19 — End: 2020-05-24
  Filled 2020-04-19: qty 60, 30d supply, fill #0

## 2020-04-19 MED ORDER — SPIRONOLACTONE 25 MG PO TABS
12.5000 mg | ORAL_TABLET | Freq: Every day | ORAL | 3 refills | Status: DC
  Filled 2020-04-19: qty 30, 60d supply, fill #0

## 2020-04-19 MED ORDER — POTASSIUM CHLORIDE CRYS ER 20 MEQ PO TBCR
40.0000 meq | EXTENDED_RELEASE_TABLET | Freq: Every day | ORAL | 3 refills | Status: DC
  Filled 2020-04-19: qty 60, 30d supply, fill #0

## 2020-04-19 MED ORDER — DAPAGLIFLOZIN PROPANEDIOL 10 MG PO TABS
10.0000 mg | ORAL_TABLET | Freq: Every day | ORAL | 3 refills | Status: DC
  Filled 2020-04-19: qty 30, 30d supply, fill #0

## 2020-04-19 NOTE — Progress Notes (Addendum)
Patient ID: Roberto [name], male   DOB: 1965/11/15, 55 y.o.   MRN: [id number]     Advanced Heart Failure Rounding Note  PCP-Cardiologist: Arvilla Meres, MD   Subjective:    RHC after diuresis showed low filling pressures and normal cardiac output.   Diuretics held yesterday, SCr improving, 2.7>>2.4  Wt up 1 lb.   K 3.7   SBPs remain soft, 90s-low 100s but asymptomatic.    RHC Findings:  RA = 2 RV = 24/1 PA = 25/3 (15) PCW = 7 Fick cardiac output/index = 6.0/23 Thermo CO/CI = 6.1/2.4 PVR = 0.7 WU FA sat = 95% PA sat = 67%, 68%  Assessment: 1. Low filling pressures 2. Normal cardiac output   Objective:   Weight Range: (!) 155.5 kg Body mass index is 52.12 kg/m.   Vital Signs:   Temp:  [97.7 F (36.5 C)-98.3 F (36.8 C)] 97.7 F (36.5 C) (04/07 0437) Pulse Rate:  [67-72] 70 (04/07 0802) Resp:  [16-19] 18 (04/07 0437) BP: (96-109)/(64-75) 109/75 (04/07 0802) SpO2:  [95 %-97 %] 97 % (04/07 0802) Weight:  [155.5 kg] 155.5 kg (04/07 0437) Last BM Date: 04/18/20  Weight change: Filed Weights   04/17/20 0444 04/18/20 0521 04/19/20 0437  Weight: (!) 155.5 kg (!) 154.7 kg (!) 155.5 kg    Intake/Output:   Intake/Output Summary (Last 24 hours) at 04/19/2020 0848 Last data filed at 04/19/2020 0806 Gross per 24 hour  Intake 480 ml  Output 2400 ml  Net -1920 ml      Physical Exam    General:  morbidly obese. No respiratory difficulty HEENT: normal Neck: thick neck, no JVD. Carotids 2+ bilat; no bruits. No lymphadenopathy or thyromegaly appreciated. Cor: PMI nondisplaced. Regular rate & rhythm. No rubs, gallops or murmurs. Lungs: clear bilaterally, no wheezing  Abdomen: obese soft, nontender, nondistended. No hepatosplenomegaly. No bruits or masses. Good bowel sounds. + Rt sided ileostomy Extremities: no cyanosis, clubbing, rash, no LEE, + bilateral chronic venous stasis dermatitis  Neuro: alert & oriented x 3, cranial nerves grossly intact. moves all 4  extremities w/o difficulty. Affect pleasant.  Telemetry   NSR 70s Personally reviewed  Labs    CBC Recent Labs    04/17/20 1621  HGB 17.3*  18.7*  HCT 51.0  55.0*   Basic Metabolic Panel Recent Labs    86/57/84 0610 04/17/20 1621 04/18/20 0412 04/19/20 0649  NA 136   < > 134* 134*  K 3.4*   < > 3.5 3.7  CL 93*  --  94* 97*  CO2 30  --  29 27  GLUCOSE 157*  --  132* 136*  BUN 31*  --  39* 40*  CREATININE 2.39*  --  2.66* 2.40*  CALCIUM 9.0  --  8.9 8.9  MG 2.2  --  2.3  --    < > = values in this interval not displayed.   Liver Function Tests No results for input(s): AST, ALT, ALKPHOS, BILITOT, PROT, ALBUMIN in the last 72 hours. No results for input(s): LIPASE, AMYLASE in the last 72 hours. Cardiac Enzymes No results for input(s): CKTOTAL, CKMB, CKMBINDEX, TROPONINI in the last 72 hours.  BNP: BNP (last 3 results) Recent Labs    09/13/19 0952 04/13/20 1126  BNP 348.4* 917.0*    ProBNP (last 3 results) No results for input(s): PROBNP in the last 8760 hours.   D-Dimer No results for input(s): DDIMER in the last 72 hours. Hemoglobin A1C No results for input(s):  HGBA1C in the last 72 hours. Fasting Lipid Panel No results for input(s): CHOL, HDL, LDLCALC, TRIG, CHOLHDL, LDLDIRECT in the last 72 hours. Thyroid Function Tests No results for input(s): TSH, T4TOTAL, T3FREE, THYROIDAB in the last 72 hours.  Invalid input(s): FREET3  Other results:   Imaging    No results found.   Medications:     Scheduled Medications: . amiodarone  200 mg Oral Daily  . apixaban  5 mg Oral BID  . carvedilol  3.125 mg Oral BID WC  . digoxin  0.125 mg Oral Daily  . potassium chloride SA  60 mEq Oral TID  . sodium chloride flush  3 mL Intravenous Q12H  . sodium chloride flush  3 mL Intravenous Q12H  . sodium chloride flush  3 mL Intravenous Q12H    Infusions: . sodium chloride    . sodium chloride      PRN Medications: sodium chloride, sodium chloride,  acetaminophen, ondansetron (ZOFRAN) IV, sodium chloride flush, sodium chloride flush   Assessment/Plan   1. Acute on Chronic Systolic Heart Failure - Echo 42/7/06 - LVEF 15% - TEE 03/2017 LVEF 15-20% - Echo 07/2017 with LVEF 20-25% - Echo 7/20 EF 25-30% - Echo 12/21 EF 25-30% - Now admitted for a/c CHF, NYHA Class III symptoms, w/ volume overload in the setting of recent diuretic dose reduction in the setting of worsening renal function.  - Volume status much improved with IV diuresis.  - RHC after diuresis showed low filling pressures and normal cardiac output.  - Lasix and spiro held yesterday w/ rising SCr and low BP, SCr improved today  - continue to hold Entresto for now w/ elevated creatinine and soft BP.  - Continue digoxin, level ok < 0.2.  - have held off on SGLT2i w/ ileostomy w/ risk of GU/peritoneal infection but I think this is likely low risk.  Will start Farxiga 10 mg daily  - Add back spiro 12.5 mg daily  - Restart Torsemide 40 mg once daily   2.  AKI on Stage III CKD - prior baseline SCr ~1.6 - recent increase to 1.9>>2.1>2.4>2.7 - admitted w/ acute fluid overload and diuresed, now dry low filling pressures on RHC. Diuretics held - SCr better today, 2.4  - Continue to hold Entresto w/ soft BP  - Will add SGLT2i, Farxiga 10 mg  - Restart Spiro 12.5 + Torsemide 40 mg once daily  - will arrange f/u BMP in [facility] in 7 days    3. H/o VT - had VT 12/21 in setting of hypokalemia related to metolazone use - no further VT has been detected on most recent device interrogation  - keep K > 4.0 and Mg >2.0  Treat as above - No driving for 6 months (~2/37).  4. H/o Atrial Flutter s/p AFL Ablation  - Maintaining NSR on EKG today -Continueamiodarone 200 mg.  - ContinueEliquis 5 bid.  5. 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 S/p skin grafting 09/05/19. - Abdominal wound healed, currently no plans for reversal of ileostomy due  to hernia  6. Hyponatremia - K 3.7 - replete w/ KCl - Add back spiro 12.5 - BMP in 1 week  Stable for d/c home from CHF standpoint. We will arrange for f/u BMP in 1 week as well as provider f/u in the [facility] and will place appt info in AVS.   Cardiac Meds for Discharge Amiodarone 200 mg daily  Eliquis 5 mg bid Coreg 3.125 mg bid  Digoxin 0.125 mg daily  KCl 40 MeQ daily  Torsemide 40 mg once daily  Spironolactone 12.5 mg daily  Farxiga 10 mg daily    Length of Stay: [address], PA-C  04/19/2020, 8:48 AM  Advanced Heart Failure Team Pager [phone] (M-F; 7a - 5p)  Please contact [facility] for night-coverage after hours (5p -7a ) and weekends on [email or web address]  Patient seen and examined with the above-signed Advanced Practice Provider and/or Housestaff. I personally reviewed laboratory data, imaging studies and relevant notes. I independently examined the patient and formulated the important aspects of the plan. I have edited the note to reflect any of my changes or salient points. I have personally discussed the plan with the patient and/or family.  He looks good. Volume status ok. Creatinine improving. Ok for d/c on above regimen. Will arrange f/u in HF Clinic.   Arvilla Meres, MD  4:40 PM

## 2020-04-19 NOTE — Progress Notes (Signed)
D/C instructions given and reviewed. Tele and IV removed, tolerated well. Stated his ride will be here around 1pm to transport him home.

## 2020-04-19 NOTE — Plan of Care (Signed)

## 2020-04-19 NOTE — Discharge Summary (Signed)
Physician Discharge Summary   Patient ID: Roberto [name] MRN: [id number] DOB/AGE: Feb 24, 1965 55 y.o.  Admit date: 04/13/2020 Discharge date: 04/19/2020  Primary Care Physician:  [name], MD   Recommendations for Outpatient Follow-up:  1. Follow up with PCP in 1-2 weeks 2. Please obtain BMP in 1 week all at follow-up with cardiology (scheduled) 3. Torsemide changed to 40 mg daily 4. Hold Entresto until cardiology follow-up, spironolactone changed to 12.5 mg daily 5. Added Farxiga 10 mg daily  Home Health: Currently at baseline Equipment/Devices:   Discharge Condition: stable CODE STATUS: FULL  Diet recommendation: Heart healthy diet, low-sodium   Discharge Diagnoses:    . Acute on chronic combined systolic and diastolic CHF (congestive heart failure) (HCC)  Acute kidney injury on CKD stage IIIa . Typical atrial flutter s/p catheter ablation . History of VT with AICD  . History of prior DVT, PE . History of atrial flutter status post ablation, on Eliquis . S/P implantation of automatic cardioverter/defibrillator (AICD)  Morbid obesity  Consults: Cardiology    Allergies:   Allergies  Allergen Reactions  . Shrimp [Shellfish Allergy] Hives and Itching     DISCHARGE MEDICATIONS: Allergies as of 04/19/2020      Reactions   Shrimp [shellfish Allergy] Hives, Itching      Medication List    STOP taking these medications   Entresto 49-51 MG Generic drug: sacubitril-valsartan   metolazone 2.5 MG tablet Commonly known as: ZAROXOLYN     TAKE these medications   amiodarone 200 MG tablet Commonly known as: PACERONE Take 1 tablet (200 mg total) by mouth daily.   carvedilol 3.125 MG tablet Commonly known as: COREG TAKE 1 TABLET(3.125 MG) BY MOUTH TWICE DAILY WITH A MEAL What changed: See the new instructions.   coconut oil Oil Apply 1 application topically daily as needed (dry skin).   digoxin 0.125 MG tablet Commonly known as: LANOXIN Take 1 tablet  (0.125 mg total) by mouth daily.   Eliquis 5 MG Tabs tablet Generic drug: apixaban TAKE 1 TABLET(5 MG) BY MOUTH TWICE DAILY What changed: See the new instructions.   Farxiga 10 MG Tabs tablet Generic drug: dapagliflozin propanediol Take 1 tablet (10 mg total) by mouth daily.   potassium chloride SA 20 MEQ tablet Commonly known as: KLOR-CON Take 2 tablets (40 mEq total) by mouth daily. What changed: how much to take   spironolactone 25 MG tablet Commonly known as: ALDACTONE Take 1/2 tablet (12.5 mg total) by mouth daily. What changed: how much to take   torsemide 20 MG tablet Commonly known as: DEMADEX Take 2 tablets (40 mg total) by mouth in the morning. What changed: See the new instructions.        Brief H and P: For complete details please refer to admission H and P, but in brief Patient is a 55 year old male with history of systolic and diastolic CHF, status post AICD, paroxysmal A. fib on Eliquis, DVT and PE, CKD stage 3a, history of diverticulitis status post ileostomy, morbid obesity presented with shortness of breath.  On the day of admission, patient felt his heart racing transiently, having difficulty breathing even walking 200 feet which was unusual for him.  BNP 917.  Chest x-ray significant for cardiomegaly with pulmonary vascular congestion.  Patient was found to have significant fluid overload.  In ED, patient received Lasix 40 mg IV, cardiology was consulted.  Hospital Course:  Acute on chronic combined systolic and diastolic CHF (congestive heart failure) (HCC) -Patient presented  with fluid overload, elevated BNP, chest x-ray with pulmonary vascular congestion in the setting of recent diuretic dose reduction, worsening renal function -Patient was placed on IV diuresis, CHF team/cardiology was consulted. -2D echo 12/2019 had shown EF of 25 to 30%, grade 3 DD -Negative balance of 15.5 L weight down from 352.3 on admission-> 342 at discharge. -Continue to hold  Entresto with elevated creatinine, borderline BP.   -Creatinine trended up to 2.6 on 4/6, Lasix was placed on hold.  Creatinine improving to 2.4 today, baseline 1.9-2 -Patient underwent right heart cath on 4/5 which showed low filling pressures, normal cardiac output, recommended hold diuretics -Cardiology recommended torsemide 40 mg daily, spironolactone 12.5 mg daily, digoxin 0.125 mg daily, hold Entresto.  Continue Coreg 3.125 mg daily  Active Problems: Acute kidney injury on CKD stage IIIa -Baseline creatinine ~1.6, presented with creatinine of 2.1, trended up due to diuresis -Creatinine improving, 2.4 at the time of discharge -Continue to hold Entresto   History of VT [facility]), AICD -History of VT in 12/2019 in the setting of hypokalemia -Keep potassium above 4, magnesium above 2 -Per cardiology no driving for 6 months, till 06/2020  History of atrial flutter status post ablation -continue amiodarone, Coreg, rate controlled -Continue eliquis, maintaining NSR  History of DVT, PE -Currently on Eliquis  History of diverticulitis status post resection, ileostomy -Continue ostomy care, no acute issues  Morbid obesity Estimated body mass index is 51.86 kg/m as calculated from the following:   Height as of this encounter: 5\' 8"  (1.727 m).   Weight as of this encounter: 154.7 kg.  Day of Discharge S: *No acute complaints, hoping to go home today, creatinine function improving.  BP 131/89 (BP Location: Left Wrist)   Pulse 72   Temp 97.8 F (36.6 C) (Oral)   Resp 17   Ht 5\' 8"  (1.727 m)   Wt (!) 155.5 kg   SpO2 98%   BMI 52.12 kg/m   Physical Exam: General: Alert and awake oriented x3 not in any acute distress. HEENT: anicteric sclera, pupils reactive to light and accommodation CVS: S1-S2 clear no murmur rubs or gallops Chest: clear to auscultation bilaterally, no wheezing rales or rhonchi Abdomen: soft nontender, nondistended, normal bowel sounds Extremities: no  cyanosis, clubbing or edema noted bilaterally Neuro: Cranial nerves II-XII intact, no focal neurological deficits    Get Medicines reviewed and adjusted: Please take all your medications with you for your next visit with your Primary MD  Please request your Primary MD to go over all hospital tests and procedure/radiological results at the follow up. Please ask your Primary MD to get all Hospital records sent to his/her office.  If you experience worsening of your admission symptoms, develop shortness of breath, life threatening emergency, suicidal or homicidal thoughts you must seek medical attention immediately by calling 911 or calling your MD immediately  if symptoms less severe.  You must read complete instructions/literature along with all the possible adverse reactions/side effects for all the Medicines you take and that have been prescribed to you. Take any new Medicines after you have completely understood and accept all the possible adverse reactions/side effects.   Do not drive when taking pain medications.   Do not take more than prescribed Pain, Sleep and Anxiety Medications  Special Instructions: If you have smoked or chewed Tobacco  in the last 2 yrs please stop smoking, stop any regular Alcohol  and or any Recreational drug use.  Wear Seat belts while driving.  Please  note  You were cared for by a hospitalist during your hospital stay. Once you are discharged, your primary care physician will handle any further medical issues. Please note that NO REFILLS for any discharge medications will be authorized once you are discharged, as it is imperative that you return to your primary care physician (or establish a relationship with a primary care physician if you do not have one) for your aftercare needs so that they can reassess your need for medications and monitor your lab values.   The results of significant diagnostics from this hospitalization (including imaging,  microbiology, ancillary and laboratory) are listed below for reference.      Procedures/Studies:  DG Chest 2 View  Result Date: 04/13/2020 CLINICAL DATA:  Shortness of breath. EXAM: CHEST - 2 VIEW COMPARISON:  12/16/2019 FINDINGS: A single lead ICD remains in place. The cardiac silhouette remains mildly enlarged. Pulmonary vascular congestion is similar to the prior study. No confluent airspace opacity, pleural effusion, pneumothorax is identified. No acute osseous abnormality is seen. IMPRESSION: Cardiomegaly and pulmonary vascular congestion. Electronically Signed   By: Sebastian Ache M.D.   On: 04/13/2020 11:48   CARDIAC CATHETERIZATION  Result Date: 04/17/2020 Findings: RA = 2 RV = 24/1 PA = 25/3 (15) PCW = 7 Fick cardiac output/index = 6.0/23 Thermo CO/CI = 6.1/2.4 PVR = 0.7 WU FA sat = 95% PA sat = 67%, 68% Assessment: 1. Low filling pressures 2. Normal cardiac output Plan/Discussion: He is dry. Hold diuretics. Arvilla Meres, MD 4:28 PM   US RENAL  Result Date: 04/17/2020 CLINICAL DATA:  Acute renal failure EXAM: RENAL / URINARY TRACT ULTRASOUND COMPLETE COMPARISON:  CT 01/19/2020 FINDINGS: Right Kidney: Renal measurements: 11.4 x 5.2 x 4.1 cm = volume: 125 mL. Echogenicity within normal limits. No mass or hydronephrosis visualized. Left Kidney: Renal measurements: 13.4 x 4.3 x 4.8 cm = volume: 145 mL. Echogenicity within normal limits. No mass or hydronephrosis visualized. Bladder: Appears normal for degree of bladder distention. Other: None. IMPRESSION: Normal ultrasound appearance of the kidneys and bladder. No evidence of hydronephrosis. Electronically Signed   By: Burman Nieves M.D.   On: 04/17/2020 20:31      LAB RESULTS: Basic Metabolic Panel: Recent Labs  Lab 04/18/20 0412 04/19/20 0649  NA 134* 134*  K 3.5 3.7  CL 94* 97*  CO2 29 27  GLUCOSE 132* 136*  BUN 39* 40*  CREATININE 2.66* 2.40*  CALCIUM 8.9 8.9  MG 2.3  --    Liver Function Tests: No results for  input(s): AST, ALT, ALKPHOS, BILITOT, PROT, ALBUMIN in the last 168 hours. No results for input(s): LIPASE, AMYLASE in the last 168 hours. No results for input(s): AMMONIA in the last 168 hours. CBC: Recent Labs  Lab 04/13/20 1126 04/17/20 1621  WBC 9.3  --   HGB 15.6 17.3*  18.7*  HCT 47.7 51.0  55.0*  MCV 92.3  --   PLT 164  --    Cardiac Enzymes: No results for input(s): CKTOTAL, CKMB, CKMBINDEX, TROPONINI in the last 168 hours. BNP: Invalid input(s): POCBNP CBG: No results for input(s): GLUCAP in the last 168 hours.     Disposition and Follow-up: Discharge Instructions    (HEART FAILURE PATIENTS) Call MD:  Anytime you have any of the following symptoms: 1) 3 pound weight gain in 24 hours or 5 pounds in 1 week 2) shortness of breath, with or without a dry hacking cough 3) swelling in the hands, feet or stomach 4) if  you have to sleep on extra pillows at night in order to breathe.   Complete by: As directed    Diet - low sodium heart healthy   Complete by: As directed    Discharge instructions   Complete by: As directed    Per cardiology no driving for 6 months, till 06/2020.  Please follow-up with cardiology for clearance.   Increase activity slowly   Complete by: As directed        DISPOSITION: Home  DISCHARGE FOLLOW-UP  Follow-up Information    [facility] Follow up on 05/17/2020.   Specialty: Cardiology Why: 3:30 PM [facility], Parking Garage Code 32[address] Contact information: [address] [id number] [place] [address] [phone]       Bensimhon, Bevelyn Buckles, MD Follow up on 04/26/2020.   Specialty: Cardiology Why: 9:30 AM for Lab appointment (BMP), at the advanced heart failure clinic  Contact information: [address] [place] [address] [phone]        [name], MD. Go on 05/03/2020.   Specialty: Family Medicine Why:  @11 :30am Contact information: [address] [place] [phone]        [phone], MD.   Specialty: Cardiology Contact information: [address] [place] [phone]                Time coordinating discharge:  35 minutes  Signed:   [phone] M.D. [facility] 04/19/2020, 12:44 PM

## 2020-04-19 NOTE — TOC Transition Note (Addendum)
Transition of Care [facility]) - CM/SW Discharge Note Heart Failure   Patient Details  Name: Roberto [name] MRN: [id number] Date of Birth: [birth year]  [facility] CM/SW Contact:  Paras Kreider, LCSWA Phone Number: 04/19/2020, 11:32 AM   Clinical Narrative:    CSW completed very brief SDOH screening with the patient who denied having any needs at this time. [name] reported he does have a PCP and he can get to the pharmacy to pick up his medications. CSW provided the patient with social workers name and position and an appointment card for the [facility] outpatient clinic and encouraged him to follow up and to attend the appointment and bring his medications and if anything changes to please reach out so that CSW/HV clinic team can provide support.  CSW will sign off for now as social work intervention is no longer needed. Please consult [place] again if new needs arise.  Final next level of care: Home/Self Care Barriers to Discharge: No Barriers Identified   Patient Goals and CMS Choice        Discharge Placement                       Discharge Plan and Services In-house Referral: Clinical Social Work                                   Social Determinants of Health (SDOH) Interventions Food Insecurity Interventions: Intervention Not Indicated Financial Strain Interventions: Intervention Not Indicated Housing Interventions: Intervention Not Indicated Transportation Interventions: Intervention Not Indicated   Readmission Risk Interventions Readmission Risk Prevention Plan 01/04/2019  Transportation Screening Complete  PCP or Specialist Appt within 3-5 Days Not Complete  Not Complete comments pt not stable for dc at this time  HRI or Home Care Consult Complete  Social Work Consult for Recovery Care Planning/Counseling Complete  Palliative Care Screening Not Applicable  Medication Review Oceanographer) Referral to Pharmacy  Some recent data might be hidden    Willene Holian, MSW, LCSWA [phone] Heart Failure Social Worker

## 2020-04-23 ENCOUNTER — Ambulatory Visit (INDEPENDENT_AMBULATORY_CARE_PROVIDER_SITE_OTHER): Payer: BC Managed Care – PPO

## 2020-04-23 DIAGNOSIS — Z95810 Presence of automatic (implantable) cardiac defibrillator: Secondary | ICD-10-CM

## 2020-04-23 DIAGNOSIS — I5022 Chronic systolic (congestive) heart failure: Secondary | ICD-10-CM

## 2020-04-23 NOTE — Progress Notes (Signed)
EPIC Encounter for ICM Monitoring  Patient Name: Roberto [name] is a 55 y.o. male Date: 04/23/2020 Primary Care Physican: [name], MD Primary Cardiologist:Bensimhon Electrophysiologist:[place] 1/28/2022OfficeWeight:339lbs  Time in AF0.0 hr/day (0.0%)  Attempted call to patient and unable to reach.  Left message to return call. Transmission reviewed.   Hospitalized 4/1-4/7 for CHF exacerbation.    Optivol thoracic impedancesuggesting dryness following 04/19/2020 hospital discharged.  Pt diuresed during 4/1-4/7 hospitalization.  Prescribed:   Torsemide20 mgTake 2 tablet (40 mg total) by mouth daily  Potassium 20 mEq take 2 tablets (40 mEq total) daily.   Spironolactone 25 mg take 0.5 tablet (12.5 mg total) daily  Farxiga 10 mg take 1 tablet daily  Labs: BMET scheduled for 04/26/2020 04/19/2020 Creatinine 2.40, BUN 40, Potassium 3.7, Sodium 134, GFR 31 04/18/2020 Creatinine 2.66, BUN 39, Potassium 3.5, Sodium 134, GFR 28  04/17/2020 Potassium 2.7, Sodium 143 (4:21 PM)  04/17/2020 Creatinine 2.39, BUN 31, Potassium 3.4, Sodium 136, GFR 31  04/16/2020 Creatinine 1.97, BUN 28, Potassium 3.3, Sodium 137, GFR 40  A complete set of results can be found in Results Review.  Recommendations:Unable to reach.    Follow-up plan: ICM clinic phone appointment on4/15/2022 to recheck fluid levels. 91 day device clinic remote transmission4/15/2022.   EP/Cardiology Office Visits:05/17/2020 with HF Clinic for post hospital f/u. 07/02/2020 with Dr Gala Romney   Copy of ICM check sent to [facility] and Dr Gala Romney.  3 month ICM trend: 04/23/2020.    1 Year ICM trend:       Karie Soda, RN 04/23/2020 9:59 AM

## 2020-04-26 ENCOUNTER — Other Ambulatory Visit: Payer: Self-pay

## 2020-04-26 ENCOUNTER — Ambulatory Visit (HOSPITAL_COMMUNITY)
Admit: 2020-04-26 | Discharge: 2020-04-26 | Disposition: A | Discharge status: Home / Self Care | Payer: BC Managed Care – PPO | Attending: Cardiology | Admitting: Cardiology

## 2020-04-26 DIAGNOSIS — I5022 Chronic systolic (congestive) heart failure: Secondary | ICD-10-CM | POA: Insufficient documentation

## 2020-04-26 LAB — BASIC METABOLIC PANEL
Anion gap: 9 (ref 5–15)
BUN: 34 mg/dL — ABNORMAL HIGH (ref 6–20)
CO2: 24 mmol/L (ref 22–32)
Calcium: 8.8 mg/dL — ABNORMAL LOW (ref 8.9–10.3)
Chloride: 104 mmol/L (ref 98–111)
Creatinine, Ser: 2.21 mg/dL — ABNORMAL HIGH (ref 0.61–1.24)
GFR, Estimated: 35 mL/min — ABNORMAL LOW (ref >60)
Glucose, Bld: 177 mg/dL — ABNORMAL HIGH (ref 70–99)
Potassium: 3.4 mmol/L — ABNORMAL LOW (ref 3.5–5.1)
Sodium: 137 mmol/L (ref 135–145)

## 2020-04-26 NOTE — Progress Notes (Signed)
   Covid-19 Vaccination Clinic  Name:  Roberto [name]    MRN: [id number] DOB: [birth year]  04/26/2020  [name] was observed post Covid-19 immunization for 15 minutes without incident. He was provided with Vaccine Information Sheet and instruction to access the V-Safe system.   [name] was instructed to call 911 with any severe reactions post vaccine: [name] Difficulty breathing  . Swelling of face and throat  . A fast heartbeat  . A bad rash all over body  . Dizziness and weakness   Immunizations Administered    Name Date Dose VIS Date Route   Moderna Covid-19 Booster Vaccine 12/28/2019 12:00 PM 0.25 mL 11/02/2019 Intramuscular   Manufacturer: Moderna   Lot: [id number]   NDC: 99774-142-39

## 2020-04-27 ENCOUNTER — Other Ambulatory Visit: Payer: Self-pay | Admitting: Internal Medicine

## 2020-04-27 ENCOUNTER — Ambulatory Visit (INDEPENDENT_AMBULATORY_CARE_PROVIDER_SITE_OTHER): Payer: BC Managed Care – PPO

## 2020-04-27 DIAGNOSIS — Z95810 Presence of automatic (implantable) cardiac defibrillator: Secondary | ICD-10-CM

## 2020-04-27 DIAGNOSIS — I472 Ventricular tachycardia, unspecified: Secondary | ICD-10-CM

## 2020-04-27 DIAGNOSIS — I5022 Chronic systolic (congestive) heart failure: Secondary | ICD-10-CM

## 2020-04-27 NOTE — Progress Notes (Signed)
EPIC Encounter for ICM Monitoring  Patient Name: Roberto [name] is a 55 y.o. male Date: 04/27/2020 Primary Care Physican: [name], MD Primary Cardiologist:Bensimhon Electrophysiologist:Camnitz 1/28/2022OfficeWeight:339lbs  Time in AF0.0 hr/day (0.0%)  Transmission reviewed.   Hospitalized 4/1-4/7 for CHF exacerbation.    Optivol thoracic impedancesuggesting improvement and fluid levels returning to baseline.  Prescribed:   Torsemide20 mgTake 2 tablet (40 mg total) by mouthdaily  Potassium 20 mEq take 2tablets (40 mEq total)daily.   Spironolactone 25 mg take 0.5 tablet (12.5 mg total) daily  Farxiga 10 mg take 1 tablet daily  Labs:  04/26/2020 Creatinine 2.21, BUN 34, Potassium 3.4, Sodium 137, GFR 35 04/19/2020 Creatinine 2.40, BUN 40, Potassium 3.7, Sodium 134, GFR 31 04/18/2020 Creatinine 2.66, BUN 39, Potassium 3.5, Sodium 134, GFR 28  04/17/2020 Potassium 2.7, Sodium 143 (4:21 PM)  04/17/2020 Creatinine 2.39, BUN 31, Potassium 3.4, Sodium 136, GFR 31  04/16/2020 Creatinine 1.97, BUN 28, Potassium 3.3, Sodium 137, GFR 40  A complete set of results can be found in Results Review.  Recommendations:No changes  Follow-up plan: ICM clinic phone appointment on5/02/2020. 91 day device clinic remote transmission4/15/2022.   EP/Cardiology Office Visits:05/17/2020 with HF Clinic for post hospital f/u. 07/02/2020 with Dr Gala Romney  Copy of ICM check sent to [facility] and Dr Gala Romney as Lorain Childes.  3 month ICM trend: 04/27/2020.    1 Year ICM trend:       Karie Soda, RN 04/27/2020 11:18 AM

## 2020-04-30 LAB — CUP PACEART REMOTE DEVICE CHECK
Battery Remaining Longevity: 125 mo
Battery Voltage: 3.02 V
Brady Statistic RV Percent Paced: 0.03 %
Date Time Interrogation Session: 20220415043823
HighPow Impedance: 82 Ohm
Implantable Lead Implant Date: 20200821
Implantable Lead Location: 753860
Implantable Lead Model: 6935
Implantable Pulse Generator Implant Date: 20200821
Lead Channel Impedance Value: 304 Ohm
Lead Channel Impedance Value: 399 Ohm
Lead Channel Pacing Threshold Amplitude: 1 V
Lead Channel Pacing Threshold Pulse Width: 0.4 ms
Lead Channel Sensing Intrinsic Amplitude: 4.375 mV
Lead Channel Sensing Intrinsic Amplitude: 4.375 mV
Lead Channel Setting Pacing Amplitude: 2 V
Lead Channel Setting Pacing Pulse Width: 0.4 ms
Lead Channel Setting Sensing Sensitivity: 0.3 mV

## 2020-05-03 ENCOUNTER — Inpatient Hospital Stay: Payer: BC Managed Care – PPO | Admitting: Family Medicine

## 2020-05-09 ENCOUNTER — Encounter (HOSPITAL_COMMUNITY): Payer: Self-pay | Admitting: *Deleted

## 2020-05-14 ENCOUNTER — Other Ambulatory Visit: Payer: Self-pay

## 2020-05-14 ENCOUNTER — Encounter: Payer: Self-pay | Admitting: Family Medicine

## 2020-05-14 ENCOUNTER — Ambulatory Visit: Payer: BC Managed Care – PPO | Admitting: Family Medicine

## 2020-05-14 ENCOUNTER — Ambulatory Visit (INDEPENDENT_AMBULATORY_CARE_PROVIDER_SITE_OTHER): Payer: BC Managed Care – PPO

## 2020-05-14 VITALS — BP 128/80 | HR 69 | Temp 98.3°F | Wt 355.2 lb

## 2020-05-14 DIAGNOSIS — Z95810 Presence of automatic (implantable) cardiac defibrillator: Secondary | ICD-10-CM

## 2020-05-14 DIAGNOSIS — Z7901 Long term (current) use of anticoagulants: Secondary | ICD-10-CM

## 2020-05-14 DIAGNOSIS — E1122 Type 2 diabetes mellitus with diabetic chronic kidney disease: Secondary | ICD-10-CM

## 2020-05-14 DIAGNOSIS — I5022 Chronic systolic (congestive) heart failure: Secondary | ICD-10-CM

## 2020-05-14 DIAGNOSIS — N1832 Chronic kidney disease, stage 3b: Secondary | ICD-10-CM

## 2020-05-14 DIAGNOSIS — Z6841 Body Mass Index (BMI) 40.0 and over, adult: Secondary | ICD-10-CM

## 2020-05-14 LAB — POCT GLYCOSYLATED HEMOGLOBIN (HGB A1C)
HbA1c POC (<> result, manual entry): 7 % (ref 4.0–5.6)
Hemoglobin A1C: 7 % — AB (ref 4.0–5.6)

## 2020-05-14 NOTE — Progress Notes (Signed)
Remote ICD transmission.   

## 2020-05-14 NOTE — Progress Notes (Signed)
   Subjective:    Patient ID: Roberto [name], male    DOB: August 25, 1965, 56 y.o.   MRN: [id number]  HPI He is here for recheck after recent hospitalization.  He had acute on chronic CHF.  He was seen by cardiology.  His medications were readjusted.  He is now on torsemide, spironolactone, digitalis and now taking [place].  Does have a previous history of glucose intolerance. He continues on Eliquis.  He states that his weight has been relatively stable.  He does have an appoint set up with cardiology in the near future.  Review of Systems     Objective:   Physical Exam Alert and in no distress.  Cardiac exam difficult to do due to his size.  Very distant heart sounds were heard. Hemoglobin A1c is 7.0      Assessment & Plan:  Chronic systolic heart failure (HCC) - Plan: Comprehensive metabolic panel, CBC with Differential/Platelet  Morbid obesity with BMI of 50.0-59.9, adult (HCC) - Plan: Comprehensive metabolic panel, CBC with Differential/Platelet  Type 2 diabetes mellitus with stage 3b chronic kidney disease, without long-term current use of insulin (HCC) - Plan: HgB A1c  Obesity, morbid (HCC)  Chronic anticoagulation  S/P implantation of automatic cardioverter/defibrillator (AICD) He will continue on his present medication regimen and follow-up with cardiology. I explained that he has a previous history of glucose intolerance but his A1c is now 7.  This is probably as much due to his recent hospitalization.  He is not taking [place] and his kidney function indicates that we cannot really use metformin.  I discussed this with him in detail and we will follow-up with him in roughly 3 to 4 months.  He was comfortable with that.

## 2020-05-15 LAB — CBC WITH DIFFERENTIAL/PLATELET
Basophils Absolute: 0.1 10*3/uL (ref 0.0–0.2)
Basos: 1 %
EOS (ABSOLUTE): 0.1 10*3/uL (ref 0.0–0.4)
Eos: 1 %
Hematocrit: 48.8 % (ref 37.5–51.0)
Hemoglobin: 16.2 g/dL (ref 13.0–17.7)
Immature Grans (Abs): 0.1 10*3/uL (ref 0.0–0.1)
Immature Granulocytes: 1 %
Lymphocytes Absolute: 1.5 10*3/uL (ref 0.7–3.1)
Lymphs: 21 %
MCH: 30.2 pg (ref 26.6–33.0)
MCHC: 33.2 g/dL (ref 31.5–35.7)
MCV: 91 fL (ref 79–97)
Monocytes Absolute: 0.7 10*3/uL (ref 0.1–0.9)
Monocytes: 9 %
Neutrophils Absolute: 5 10*3/uL (ref 1.4–7.0)
Neutrophils: 67 %
Platelets: 148 10*3/uL — ABNORMAL LOW (ref 150–450)
RBC: 5.37 x10E6/uL (ref 4.14–5.80)
RDW: 13.2 % (ref 11.6–15.4)
WBC: 7.4 10*3/uL (ref 3.4–10.8)

## 2020-05-15 LAB — COMPREHENSIVE METABOLIC PANEL
ALT: 28 IU/L (ref 0–44)
AST: 26 IU/L (ref 0–40)
Albumin/Globulin Ratio: 0.9 — ABNORMAL LOW (ref 1.2–2.2)
Albumin: 3.3 g/dL — ABNORMAL LOW (ref 3.8–4.9)
Alkaline Phosphatase: 42 IU/L — ABNORMAL LOW (ref 44–121)
BUN/Creatinine Ratio: 9 (ref 9–20)
BUN: 18 mg/dL (ref 6–24)
Bilirubin Total: 0.5 mg/dL (ref 0.0–1.2)
CO2: 22 mmol/L (ref 20–29)
Calcium: 9.2 mg/dL (ref 8.7–10.2)
Chloride: 97 mmol/L (ref 96–106)
Creatinine, Ser: 1.91 mg/dL — ABNORMAL HIGH (ref 0.76–1.27)
Globulin, Total: 3.7 g/dL (ref 1.5–4.5)
Glucose: 139 mg/dL — ABNORMAL HIGH (ref 65–99)
Potassium: 4 mmol/L (ref 3.5–5.2)
Sodium: 141 mmol/L (ref 134–144)
Total Protein: 7 g/dL (ref 6.0–8.5)
eGFR: 41 mL/min/{1.73_m2} — ABNORMAL LOW (ref >59)

## 2020-05-17 ENCOUNTER — Encounter (HOSPITAL_COMMUNITY): Payer: Self-pay

## 2020-05-17 ENCOUNTER — Other Ambulatory Visit: Payer: Self-pay

## 2020-05-17 ENCOUNTER — Telehealth: Payer: Self-pay

## 2020-05-17 ENCOUNTER — Ambulatory Visit (HOSPITAL_COMMUNITY)
Admission: RE | Admit: 2020-05-17 | Discharge: 2020-05-17 | Disposition: A | Discharge status: Home / Self Care | Payer: BC Managed Care – PPO | Source: Ambulatory Visit | Attending: Adult Health | Admitting: Adult Health

## 2020-05-17 VITALS — BP 116/84 | HR 67 | Wt 358.2 lb

## 2020-05-17 DIAGNOSIS — I480 Paroxysmal atrial fibrillation: Secondary | ICD-10-CM | POA: Diagnosis not present

## 2020-05-17 DIAGNOSIS — I428 Other cardiomyopathies: Secondary | ICD-10-CM | POA: Insufficient documentation

## 2020-05-17 DIAGNOSIS — I472 Ventricular tachycardia, unspecified: Secondary | ICD-10-CM

## 2020-05-17 DIAGNOSIS — I5022 Chronic systolic (congestive) heart failure: Secondary | ICD-10-CM

## 2020-05-17 DIAGNOSIS — Z95810 Presence of automatic (implantable) cardiac defibrillator: Secondary | ICD-10-CM | POA: Diagnosis not present

## 2020-05-17 DIAGNOSIS — Z86718 Personal history of other venous thrombosis and embolism: Secondary | ICD-10-CM | POA: Insufficient documentation

## 2020-05-17 DIAGNOSIS — Z79899 Other long term (current) drug therapy: Secondary | ICD-10-CM | POA: Insufficient documentation

## 2020-05-17 DIAGNOSIS — Z7901 Long term (current) use of anticoagulants: Secondary | ICD-10-CM | POA: Diagnosis not present

## 2020-05-17 DIAGNOSIS — N1832 Chronic kidney disease, stage 3b: Secondary | ICD-10-CM | POA: Insufficient documentation

## 2020-05-17 DIAGNOSIS — I429 Cardiomyopathy, unspecified: Secondary | ICD-10-CM | POA: Insufficient documentation

## 2020-05-17 NOTE — Progress Notes (Signed)
Advanced Heart Failure Clinic Note   Patient ID: Roberto [name], male   DOB: [birth year], 55 y.o.   MRN: [id number] PCP: Dr. Susann Givens Cardiologist: Dr. [name] EP: Dr. Johney Frame AHF: Dr. Gala Romney   HPI: Roberto [name] is a 55 y.o. 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    Admitted to [facility] 12/16/11 for recurrent HF. Cath with no CAD.   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 EF dropped from 40%-> 15%. Underwent successful DCCV. Started on amiodarone.  S/p AFL ablation 11/17/2016  Pt underwent outpatient DCCV 03/20/17, then came back that evening with worsening SOB and CP. Diuresed with IV lasix. Also treated for cellulitis of his RLE, and gout. Discharge weight 280 lbs.  Echo 7/20: EF 25-30%   Admitted 12/19/18- 01/24/19 for acute diverticulits. Failed IV abx. Underwent R colectomy with ileostomy.  In OR patient became hypotensive, required pressors. Course c/b perforated R colon with right lateral peritoneal fluid collection s/p right lateral abdominal drain placement in IR 01/04/2019. Post-op struggling with non-healing ab wound and underwent skin grafting on 09/05/19.  We saw him on 12/14/19, doing well volume overloaded. Instructed to take 1 dose of metolazone, He took 2 doses & developed hypokalemia. Had syncope with head trauma and ICD shock. ICD interrogation showed VT. He has had K and mag checked subsequently by [facility] and both ok. Follow up 2 weeks later fluid was better.   No plans fo ileostomy reversal due to hernia.   Admitted 4/1 2022  With VT& A/C systolic heart failure. Diuresed with IV lasix. Had RHC with with low filling pressures and normal cardiac output.  Discharged on 04/19/20 with torsemide 40 mg daily.   Today he returns for HF follow up.Overall feeling fine. Having some SOB when walking.  Denies PND/Orthopnea. Appetite ok. No  fever or chills. Weight at home 350 pounds. Exercising 60 minutes 4-5 days a week. Taking all medications. Working full time.   Optivol: Activity ~ 2 hours per day, no VT. No A fib. Fluid index low.  Cardiac studies: Echo (12/21): EF 20-25%, RV ok, personally reviewed Echo (07/2017): EF appears slightly improved to 20-25%. Possible clot (vs artifact in IVC/RA junction) Personally reviewed Echo (10/15/16): LVEF 15% Echo (5/14): EF 40-45% Echo (12/13): EF 25-30%  TEE (03/20/17): EF 15-20%, Severe reduced RV function  RHC 04/2020  RA = 2 RV = 24/1 PA = 25/3 (15) PCW = 7 Fick cardiac output/index = 6.0/23 Thermo CO/CI = 6.1/2.4 PVR = 0.7 WU FA sat = 95% PA sat = 67%, 68% Assessment: 1. Low filling pressures 2. Normal cardiac output  RHC 10/20/16  RA = 3 RV = 45/7 PA = 49/20 (31) PCW = 28 Fick cardiac output/index = 5.0/2.1 PVR = 0.5 WU Ao sat = 95% PA sat = 64%, 61%  CARDIAC MRI - 12/18/11  1. Moderately dilated left ventricle with moderately decreased systolic function, EF 38%. Global hypokinesis.  2. Mildly dilated right ventricle with mild to moderately decreased systolic function.  3. No definite LV thrombus noted.  4. Patchy non-subendocardial delayed enhancement seen in the ventricular septum (see above for description). This is not suggestive of a coronary disease pattern. This could be suggestive of infiltrative disease versus prior myocarditis.  RHC/LHC 12/22/11  Cors: Normal  SH: Lives with his wife and 5 children. Works full time running a Audiological scientist  Review of systems complete and found to be negative unless listed in HPI.    Past Medical History:  Diagnosis Date  . AICD (automatic cardioverter/defibrillator) present 08/2018   Medtronic ICD  . Arthritis    knee  . Chronic combined systolic and diastolic CHF (congestive heart failure) (HCC)   . Coronary artery disease   . Deep vein thrombosis (HCC) 2007   left leg  . Diverticulitis   . Hepatitis    patient  denies this dx  . Lupus anticoagulant positive   . Morbid obesity (HCC)   . Morbid obesity with BMI of 50.0-59.9, adult (HCC)   . Mural thrombus of heart    coumadin  . Nonischemic cardiomyopathy (HCC)    a) 12/17/11 echo: LVEF 25-30%, grade 3 diastolic dysfunction (c/w restriction), mod MR, mod LA/LV and mild RA dilatation; b) 12/18/11 cMRI: LVEF 38%, mod LV/mild RV dilatation, global HK, mild-mod RV sys dysfxn, no LV thrombus & patchy non-subendocardial delayed enhancement c/w infil dz or prior myocarditis; c. 07/2018 EF 25-30%.  . Paroxysmal SVT (supraventricular tachycardia) (HCC)   . Pneumonia    x 2  . Pulmonary embolism (HCC)    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  . Wears glasses   . Wound of abdomen     Current Outpatient Medications  Medication Sig Dispense Refill  . amiodarone (PACERONE) 200 MG tablet Take 1 tablet (200 mg total) by mouth daily. 30 tablet 5  . carvedilol (COREG) 3.125 MG tablet TAKE 1 TABLET(3.125 MG) BY MOUTH TWICE DAILY WITH A MEAL (Patient taking differently: Take 3.125 mg by mouth 2 (two) times daily with a meal.) 180 tablet 3  . coconut oil OIL Apply 1 application topically daily as needed (dry skin).     . dapagliflozin propanediol (FARXIGA) 10 MG TABS tablet Take 1 tablet (10 mg total) by mouth daily. 30 tablet 3  . digoxin (LANOXIN) 0.125 MG tablet Take 1 tablet (0.125 mg total) by mouth daily. 30 tablet 3  . ELIQUIS 5 MG TABS tablet TAKE 1 TABLET(5 MG) BY MOUTH TWICE DAILY 60 tablet 3  . potassium chloride SA (KLOR-CON) 20 MEQ tablet Take 2 tablets (40 mEq total) by mouth daily. 60 tablet 3  . spironolactone (ALDACTONE) 25 MG tablet Take 1/2 tablet (12.5 mg total) by mouth daily. 30 tablet 3  . torsemide (DEMADEX) 20 MG tablet Take 2 tablets (40 mg total) by mouth in the morning. 60 tablet 2   No current facility-administered medications for this encounter.   PHYSICAL EXAM: Vitals:   05/17/20 1525  BP: 116/84  Pulse: 67  SpO2: 96%  Weight: (!)  162.5 kg (358 lb 3.2 oz)   Wt Readings from Last 3 Encounters:  05/17/20 (!) 162.5 kg (358 lb 3.2 oz)  05/14/20 (!) 161.1 kg (355 lb 3.2 oz)  04/19/20 (!) 155.5 kg (342 lb 13 oz)     PHYSICAL EXAM: General:  Well appearing, morbidly obese. No respiratory difficulty HEENT: normal Neck: supple. Thick neck, JVD not well visualized. Carotids 2+ bilat; no bruits. No lymphadenopathy or thyromegaly appreciated. Cor: PMI nondisplaced. Regular rate & rhythm. No rubs, gallops or murmurs. Lungs: clear Abdomen: obese, soft, nontender, nondistended. No hepatosplenomegaly. No bruits or masses. Good bowel sounds. Extremities: no cyanosis, clubbing, rash, R and LLE trace edema Neuro: alert & oriented x 3, cranial nerves grossly intact. moves all 4 extremities w/o difficulty. Affect pleasant.   ICD interrogation: Activity 2 hours, No VT, No A fib.  ASSESSMENT & PLAN:  1. H/o VT - On 12/16/19 in setting of hypokalemia related to metolazone use. - no further VT on device interrogation today - No driving for 6 months (~4/03). - BMET from 05/14/20 stable.   2. Chronic Systolic Heart Failure: - Echo 10/15/16 - LVEF 15% - TEE 03/2017 LVEF 15-20% - Echo 07/2017 with LVEF 20-25% - Echo 7/20 EF 25-30% - Echo 12/21 EF 25-30% - NYHA II/III - Continue torsemide 40 mg daily. Asked him to call if weight is above 355 pounds.  - Continue Spiro 12.5 mg daily  - Continue digoxin 0.125 mg daily. 04/14/20  Dig level <0.2  - Continue carvedilol 3.125 bid.  - Off Entresto due to recent AKI. May retry down the road.  - Continue farxiga 10 mg daily.   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 - Maintaining NSR on EKG today  - Continue amiodarone 200 mg. Check TFTs and HFTs next visit. Needs annual eye exams  - Continue Eliquis 5 bid. Denies abnormal bleeding   4. H/o Acute diverticulitis with colon resection and ileostomy - S/p skin grafting 09/05/19. - No plan for reversal of ileostomy after extensive abdominal  hernia.   5. CKD Stage IIIb Creatinine baseline 1.8-2  I reveiwed BMET from 05/14/20/    Follow up in 3 months with Dr Gala Romney.     Tonye Becket, NP -C 05/17/20

## 2020-05-17 NOTE — Patient Instructions (Signed)
It was great to see you today! No medication changes are needed at this time.  Your physician recommends that you schedule a follow-up appointment in: 3 months with Dr Gala Romney    Do the following things EVERYDAY: 1) Weigh yourself in the morning before breakfast. Write it down and keep it in a log. 2) Take your medicines as prescribed 3) Eat low salt foods--Limit salt (sodium) to 2000 mg per day.  4) Stay as active as you can everyday 5) Limit all fluids for the day to less than 2 liters  At the [facility], you and your health needs are our priority. As part of our continuing mission to provide you with exceptional heart care, we have created designated Provider Care Teams. These Care Teams include your primary Cardiologist (physician) and Advanced Practice Providers (APPs- Physician Assistants and Nurse Practitioners) who all work together to provide you with the care you need, when you need it.   You may see any of the following providers on your designated Care Team at your next follow up: [name] Dr Arvilla Meres . Dr Marca Ancona . Dr Thornell Mule . Tonye Becket, NP . Robbie Lis, PA . Shanda Bumps [place],NP . Karle Plumber, PharmD   Please be sure to bring in all your medications bottles to every appointment.   If you have any questions or concerns before your next appointment please send [place] a message through [place] or call our office at [phone].    TO LEAVE A MESSAGE FOR THE NURSE SELECT OPTION 2, PLEASE LEAVE A MESSAGE INCLUDING: . YOUR NAME . DATE OF BIRTH . CALL BACK NUMBER . REASON FOR CALL**this is important as we prioritize the call backs  YOU WILL RECEIVE A CALL BACK THE SAME DAY AS LONG AS YOU CALL BEFORE 4:00 PM

## 2020-05-17 NOTE — Telephone Encounter (Signed)
LMOVM for patient to send missed HF transmission. I left device clinic direct number in case he needs help.

## 2020-05-18 ENCOUNTER — Telehealth (HOSPITAL_COMMUNITY): Payer: Self-pay | Admitting: Pharmacy Technician

## 2020-05-18 NOTE — Telephone Encounter (Signed)
Patient Advocate Encounter   Received notification from [employer] that prior authorization for Sherryll Burger is required.   PA submitted on CoverMyMeds Key [id number] Status is pending   Will continue to follow.

## 2020-05-18 NOTE — Progress Notes (Signed)
EPIC Encounter for ICM Monitoring  Patient Name: Roberto [name] is a 55 y.o. male Date: 05/18/2020 Primary Care Physican: [name], MD Primary Cardiologist:Bensimhon Electrophysiologist:Camnitz 5/5/2022OfficeWeight:358lbs  Time in AF0.0 hr/day (0.0%)  Transmission reviewed.  Optivol thoracic impedancesuggestingnormal fluid levels.  Prescribed:   Torsemide20 mgTake2tablet (40 mg total) by mouthdaily  Potassium 20 mEq take2tablets (40 mEq total)daily.   Spironolactone 25 mg take0.5tablet(12.5 mg total)daily  Farxiga 10 mg take 1 tablet daily  Labs: 05/14/2020 Creatinine 1.91, BUN 18, Potassium 4.0, Sodium 141 04/26/2020 Creatinine 2.21, BUN 34, Potassium 3.4, Sodium 137, GFR 35 04/19/2020 Creatinine2.40, BUN40, Potassium3.7, Sodium134, GFR31 04/18/2020 Creatinine2.66, BUN39, Potassium3.5, Sodium134, GFR28  04/17/2020 Potassium2.7, [id number] (4:21 PM)  04/05/2022Creatinine 2.39, BUN31, Potassium3.4, Sodium136, GFR31  04/04/2022Creatinine 1.97, BUN28, Potassium3.3, Sodium137, GFR40 A complete set of results can be found in Results Review.  Recommendations:No changes  Follow-up plan: ICM clinic phone appointment on5/02/2020. 91 day device clinic remote transmission7/15/2022.   EP/Cardiology Office Visits:08/15/2020 with Dr Gala Romney.  Copy of ICM check sent to [facility].  3 month ICM trend: 05/17/2020.    1 Year ICM trend:       Karie Soda, RN 05/18/2020 2:43 PM

## 2020-05-21 ENCOUNTER — Other Ambulatory Visit (HOSPITAL_COMMUNITY): Payer: Self-pay

## 2020-05-21 NOTE — Telephone Encounter (Signed)
Advanced Heart Failure Patient Advocate Encounter  Prior Authorization for Sherryll Burger has been approved.    PA# [id number]  Effective dates: 05/18/20 through 05/18/21  Patients co-pay is $47 (30 days)  Archer Asa, CPhT

## 2020-05-22 ENCOUNTER — Telehealth (HOSPITAL_COMMUNITY): Payer: Self-pay | Admitting: *Deleted

## 2020-05-22 NOTE — Telephone Encounter (Signed)
Pt called stating he still had shortness of breath. pts weight is 353lbs he was told to call if it was over 355lbs but he said his shortness of breath is bothering him. Pt is only short of breath when he is walking.  Pt denies chest pain. Pt said he has swelling in legs and feet.   Routed to [employer]

## 2020-05-24 ENCOUNTER — Other Ambulatory Visit: Payer: Self-pay

## 2020-05-24 ENCOUNTER — Emergency Department (HOSPITAL_COMMUNITY)
Admission: EM | Admit: 2020-05-24 | Discharge: 2020-05-24 | Disposition: A | Discharge status: Home / Self Care | Payer: BC Managed Care – PPO | Attending: Emergency Medicine | Admitting: Emergency Medicine

## 2020-05-24 ENCOUNTER — Emergency Department (HOSPITAL_COMMUNITY): Payer: BC Managed Care – PPO

## 2020-05-24 ENCOUNTER — Encounter (HOSPITAL_COMMUNITY): Payer: Self-pay

## 2020-05-24 DIAGNOSIS — R0602 Shortness of breath: Secondary | ICD-10-CM | POA: Diagnosis present

## 2020-05-24 DIAGNOSIS — I480 Paroxysmal atrial fibrillation: Secondary | ICD-10-CM | POA: Diagnosis not present

## 2020-05-24 DIAGNOSIS — I2510 Atherosclerotic heart disease of native coronary artery without angina pectoris: Secondary | ICD-10-CM | POA: Diagnosis not present

## 2020-05-24 DIAGNOSIS — Z955 Presence of coronary angioplasty implant and graft: Secondary | ICD-10-CM | POA: Diagnosis not present

## 2020-05-24 DIAGNOSIS — Z7901 Long term (current) use of anticoagulants: Secondary | ICD-10-CM | POA: Diagnosis not present

## 2020-05-24 DIAGNOSIS — Z96651 Presence of right artificial knee joint: Secondary | ICD-10-CM | POA: Diagnosis not present

## 2020-05-24 DIAGNOSIS — Z95810 Presence of automatic (implantable) cardiac defibrillator: Secondary | ICD-10-CM | POA: Insufficient documentation

## 2020-05-24 DIAGNOSIS — I5043 Acute on chronic combined systolic (congestive) and diastolic (congestive) heart failure: Secondary | ICD-10-CM | POA: Diagnosis not present

## 2020-05-24 DIAGNOSIS — I5023 Acute on chronic systolic (congestive) heart failure: Secondary | ICD-10-CM

## 2020-05-24 LAB — CBC WITH DIFFERENTIAL/PLATELET
Abs Immature Granulocytes: 0.11 10*3/uL — ABNORMAL HIGH (ref 0.00–0.07)
Basophils Absolute: 0.1 10*3/uL (ref 0.0–0.1)
Basophils Relative: 1 %
Eosinophils Absolute: 0.2 10*3/uL (ref 0.0–0.5)
Eosinophils Relative: 2 %
HCT: 47.7 % (ref 39.0–52.0)
Hemoglobin: 15.3 g/dL (ref 13.0–17.0)
Immature Granulocytes: 1 %
Lymphocytes Relative: 22 %
Lymphs Abs: 1.8 10*3/uL (ref 0.7–4.0)
MCH: 30.1 pg (ref 26.0–34.0)
MCHC: 32.1 g/dL (ref 30.0–36.0)
MCV: 93.7 fL (ref 80.0–100.0)
Monocytes Absolute: 0.9 10*3/uL (ref 0.1–1.0)
Monocytes Relative: 12 %
Neutro Abs: 5.1 10*3/uL (ref 1.7–7.7)
Neutrophils Relative %: 62 %
Platelets: 155 10*3/uL (ref 150–400)
RBC: 5.09 MIL/uL (ref 4.22–5.81)
RDW: 13.9 % (ref 11.5–15.5)
WBC: 8.2 10*3/uL (ref 4.0–10.5)
nRBC: 0 % (ref 0.0–0.2)

## 2020-05-24 LAB — BASIC METABOLIC PANEL
Anion gap: 8 (ref 5–15)
BUN: 20 mg/dL (ref 6–20)
CO2: 23 mmol/L (ref 22–32)
Calcium: 8.8 mg/dL — ABNORMAL LOW (ref 8.9–10.3)
Chloride: 106 mmol/L (ref 98–111)
Creatinine, Ser: 1.82 mg/dL — ABNORMAL HIGH (ref 0.61–1.24)
GFR, Estimated: 44 mL/min — ABNORMAL LOW (ref >60)
Glucose, Bld: 147 mg/dL — ABNORMAL HIGH (ref 70–99)
Potassium: 3.8 mmol/L (ref 3.5–5.1)
Sodium: 137 mmol/L (ref 135–145)

## 2020-05-24 LAB — DIGOXIN LEVEL: Digoxin Level: <0.2 ng/mL — ABNORMAL LOW (ref 0.8–2.0)

## 2020-05-24 LAB — I-STAT CHEM 8, ED
BUN: 23 mg/dL — ABNORMAL HIGH (ref 6–20)
Calcium, Ion: 0.96 mmol/L — ABNORMAL LOW (ref 1.15–1.40)
Chloride: 105 mmol/L (ref 98–111)
Creatinine, Ser: 1.7 mg/dL — ABNORMAL HIGH (ref 0.61–1.24)
Glucose, Bld: 147 mg/dL — ABNORMAL HIGH (ref 70–99)
HCT: 46 % (ref 39.0–52.0)
Hemoglobin: 15.6 g/dL (ref 13.0–17.0)
Potassium: 3.7 mmol/L (ref 3.5–5.1)
Sodium: 139 mmol/L (ref 135–145)
TCO2: 26 mmol/L (ref 22–32)

## 2020-05-24 LAB — BRAIN NATRIURETIC PEPTIDE: B Natriuretic Peptide: 583.6 pg/mL — ABNORMAL HIGH (ref 0.0–100.0)

## 2020-05-24 LAB — TROPONIN I (HIGH SENSITIVITY): Troponin I (High Sensitivity): 29 ng/L — ABNORMAL HIGH (ref <18)

## 2020-05-24 MED ORDER — POTASSIUM CHLORIDE CRYS ER 20 MEQ PO TBCR: 40.0000 meq | EXTENDED_RELEASE_TABLET | Freq: Every day | ORAL | 3 refills | Status: DC

## 2020-05-24 MED ORDER — TORSEMIDE 20 MG PO TABS: 40.0000 mg | ORAL_TABLET | Freq: Two times a day (BID) | ORAL | 2 refills | Status: DC

## 2020-05-24 MED ORDER — FUROSEMIDE 10 MG/ML IJ SOLN
60.0000 mg | Freq: Once | INTRAMUSCULAR | Status: AC
  Administered 2020-05-24: 60 mg via INTRAVENOUS
  Filled 2020-05-24: qty 6

## 2020-05-24 MED ORDER — FUROSEMIDE 10 MG/ML IJ SOLN
20.0000 mg | Freq: Once | INTRAMUSCULAR | Status: AC
  Administered 2020-05-24: 20 mg via INTRAVENOUS
  Filled 2020-05-24: qty 2

## 2020-05-24 NOTE — ED Triage Notes (Addendum)
C/o SHOB x 1wk and chest tightness since Monday. Hx of CHF. Pt has ICD denies any shocks since last December. Reports increased swelling in both legs and increased gas production that sometimes causes similar pain.

## 2020-05-24 NOTE — ED Provider Notes (Signed)
[facility] EMERGENCY DEPARTMENT Provider Note   CSN: [id number] Arrival date & time: 05/24/20  2876     History Chief Complaint  Patient presents with  . Shortness of Breath    Roberto [name] is a 54 y.o. male.  55 yo M with a chief complaints of shortness of breath on exertion.  Going on for at least a month now.  Patient states that it has gotten mildly worse over the course of the past week.  He had called his cardiologist and had not yet heard back from the physician and so decided to come to the ED for evaluation.  He does get some chest tightness with this as well.  Denies diaphoresis nausea or vomiting.  Has felt like his lower extremity edema has gotten mildly worse.  Has not had any significant weight gain.  Denies orthopnea or PND.  The history is provided by the patient. The history is limited by a language barrier.  Shortness of Breath Severity:  Moderate Onset quality:  Gradual Timing:  Constant Progression:  Worsening Chronicity:  New Relieved by:  Nothing Worsened by:  Nothing Ineffective treatments:  None tried Associated symptoms: no abdominal pain, no chest pain, no fever, no headaches, no rash and no vomiting        Past Medical History:  Diagnosis Date  . AICD (automatic cardioverter/defibrillator) present 08/2018   Medtronic ICD  . Arthritis    knee  . Chronic combined systolic and diastolic CHF (congestive heart failure) (HCC)   . Coronary artery disease   . Deep vein thrombosis (HCC) 2007   left leg  . Diverticulitis   . Hepatitis    patient denies this dx  . Lupus anticoagulant positive   . Morbid obesity (HCC)   . Morbid obesity with BMI of 50.0-59.9, adult (HCC)   . Mural thrombus of heart    coumadin  . Nonischemic cardiomyopathy (HCC)    a) 12/17/11 echo: LVEF 25-30%, grade 3 diastolic dysfunction (c/w restriction), mod MR, mod LA/LV and mild RA dilatation; b) 12/18/11 cMRI: LVEF 38%, mod LV/mild RV dilatation, global  HK, mild-mod RV sys dysfxn, no LV thrombus & patchy non-subendocardial delayed enhancement c/w infil dz or prior myocarditis; c. 07/2018 EF 25-30%.  . Paroxysmal SVT (supraventricular tachycardia) (HCC)   . Pneumonia    x 2  . Pulmonary embolism (HCC)    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  . Wears glasses   . Wound of abdomen     Patient Active Problem List   Diagnosis Date Noted  . Acute on chronic combined systolic and diastolic CHF (congestive heart failure) (HCC) 04/13/2020  . S/P implantation of automatic cardioverter/defibrillator (AICD) 04/13/2020  . Hx of diverticulitis of colon 12/06/2019  . History of colon resection 12/06/2019  . History of creation of ostomy (HCC) 12/06/2019  . History of colonic polyps 12/06/2019  . Hypomagnesemia   . 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 History of DVT (deep vein thrombosis) 09/04/2018  . PAF (paroxysmal atrial fibrillation) (HCC) 09/04/2018  . Elevated uric acid in blood 05/06/2018  . Atypical atrial flutter (HCC) 03/17/2017  . Chronic pain of right knee 03/13/2017  . Typical atrial flutter s/p catheter ablation   . Varicose veins of legs 12/17/2015  . Encounter for health maintenance examination in adult 12/17/2015  . Special screening for malignant neoplasms, colon 12/17/2015  . Vaccine counseling 12/17/2015  . Knee deformity, acquired, right 12/17/2015  .  Gait disturbance 12/17/2015  . Osteoarthritis of right knee 12/17/2015  . Varicose veins of lower extremities with ulcer (HCC) 09/18/2015  . Need for prophylactic vaccination and inoculation against influenza 09/18/2015  . Tinea versicolor 06/15/2015  . Impaired fasting blood sugar 12/28/2014  . Chronic anticoagulation 12/28/2014  . Erectile dysfunction 02/19/2012  . Chronic systolic heart failure (HCC) 12/29/2011  . Hypokalemia 12/24/2011  . Lupus anticoagulant positive 12/22/2011  . Nonischemic cardiomyopathy (HCC) 12/16/2011  . Paroxysmal SVT  (supraventricular tachycardia) (HCC) 12/16/2011  . History of venous thromboembolism 12/16/2011  . Venous insufficiency 11/04/2011  . Morbid obesity with BMI of 50.0-59.9, adult (HCC) 06/02/2008    Past Surgical History:  Procedure Laterality Date  . A-FLUTTER ABLATION N/A 11/17/2016   Procedure: A-FLUTTER ABLATION;  Surgeon: Regan Lemming, MD;  Location: [facility] INVASIVE CV LAB;  Service: Cardiovascular;  Laterality: N/A;  . APPLICATION OF WOUND VAC N/A 09/05/2019   Procedure: APPLICATION OF WOUND VAC;  Surgeon: Allena Napoleon, MD;  Location: [facility] OR;  Service: Plastics;  Laterality: N/A;  PATIENT'S HOME WOUND VAC APPLIED.  [name] BIOPSY  12/23/2018   Procedure: BIOPSY;  Surgeon: Meridee Score Netty Starring., MD;  Location: [facility] ENDOSCOPY;  Service: Gastroenterology;;  . BIOPSY  01/23/2020   Procedure: BIOPSY;  Surgeon: Lemar Lofty., MD;  Location: [facility] ENDOSCOPY;  Service: Gastroenterology;;  . CARDIAC CATHETERIZATION  06/2006   Angiographically normal cors  . CARDIAC CATHETERIZATION  12/22/2011   R/LHC: normal cors, well-compensated HDs, LV dysfxn  . CARDIOVERSION N/A 10/17/2016   Procedure: CARDIOVERSION;  Surgeon: Dolores Patty, MD;  Location: [facility] ENDOSCOPY;  Service: Cardiovascular;  Laterality: N/A;  . CARDIOVERSION N/A 03/20/2017   Procedure: CARDIOVERSION;  Surgeon: Dolores Patty, MD;  Location: [facility] ENDOSCOPY;  Service: Cardiovascular;  Laterality: N/A;  . COLECTOMY N/A 12/28/2018   Procedure: TOTAL COLECTOMY;  Surgeon: Emelia Loron, MD;  Location: [facility] OR;  Service: General;  Laterality: N/A;  . FLEXIBLE SIGMOIDOSCOPY N/A 12/23/2018   Procedure: FLEXIBLE SIGMOIDOSCOPY;  Surgeon: Lemar Lofty., MD;  Location: [facility] ENDOSCOPY;  Service: Gastroenterology;  Laterality: N/A;  . FLEXIBLE SIGMOIDOSCOPY N/A 01/23/2020   Procedure: FLEXIBLE SIGMOIDOSCOPY;  Surgeon: Lemar Lofty., MD;  Location: [facility] ENDOSCOPY;  Service: Gastroenterology;  Laterality: N/A;  . ICD  IMPLANT N/A 09/03/2018   Procedure: ICD Medtronic IMPLANT;  Surgeon: Regan Lemming, MD;  Location: [facility] INVASIVE CV LAB;  Service: Cardiovascular;  Laterality: N/A;  . ILEOSTOMY N/A 12/28/2018   Procedure: ILEOSTOMY;  Surgeon: Emelia Loron, MD;  Location: [facility] OR;  Service: General;  Laterality: N/A;  . IR RADIOLOGIST EVAL & MGMT  02/08/2019  . JOINT REPLACEMENT     right knee  . LEFT AND RIGHT HEART CATHETERIZATION WITH CORONARY ANGIOGRAM N/A 12/22/2011   Procedure: LEFT AND RIGHT HEART CATHETERIZATION WITH CORONARY ANGIOGRAM;  Surgeon: Dolores Patty, MD;  Location: [facility] CATH LAB;  Service: Cardiovascular;  Laterality: N/A;  . PATELLAR TENDON REPAIR     Left  . RIGHT HEART CATH N/A 10/20/2016   Procedure: RIGHT HEART CATH;  Surgeon: Dolores Patty, MD;  Location: [facility] INVASIVE CV LAB;  Service: Cardiovascular;  Laterality: N/A;  . RIGHT HEART CATH N/A 04/17/2020   Procedure: RIGHT HEART CATH;  Surgeon: Dolores Patty, MD;  Location: [facility] INVASIVE CV LAB;  Service: Cardiovascular;  Laterality: N/A;  . SKIN SPLIT GRAFT N/A 09/05/2019   Procedure: SKIN GRAFT SPLIT THICKNESS;  Surgeon: Allena Napoleon, MD;  Location: [facility] OR;  Service: Plastics;  Laterality:  N/A;  . TEE WITHOUT CARDIOVERSION N/A 03/20/2017   Procedure: TRANSESOPHAGEAL ECHOCARDIOGRAM (TEE);  Surgeon: Dolores PattyBensimhon, Daniel R, MD;  [facility];  Service: Cardiovascular;  Laterality: N/A;       Family History  Problem Relation Age of Onset  . Hypertension Mother   . Clotting disorder Mother        mom with PEs  . Cancer Mother        uterine cancer  . Diabetes Father   . Heart attack Father   . Hypertension Other        Sibling  . Diabetes Other   . Obesity Other   . Diabetes Sister   . Obesity Sister   . Obesity Sister   . Colon cancer Neg Hx   . Stomach cancer Neg Hx   . Esophageal cancer Neg Hx   . Rectal cancer Neg Hx   . Inflammatory bowel disease Neg Hx   . Liver disease Neg Hx   . Pancreatic  cancer Neg Hx     Social History   Tobacco Use  . Smoking status: Never Smoker  . Smokeless tobacco: Never Used  Vaping Use  . Vaping Use: Never used  Substance Use Topics  . Alcohol use: Not Currently    Comment: Rarely  . Drug use: No    Home Medications Prior to Admission medications   Medication Sig Start Date End Date Taking? Authorizing Provider  amiodarone (PACERONE) 200 MG tablet Take 1 tablet (200 mg total) by mouth daily. 11/23/19  Yes Bensimhon, Bevelyn Bucklesaniel R, MD  carvedilol (COREG) 3.125 MG tablet TAKE 1 TABLET(3.125 MG) BY MOUTH TWICE DAILY WITH A MEAL Patient taking differently: Take 3.125 mg by mouth 2 (two) times daily with a meal. 02/06/20  Yes Bensimhon, Bevelyn Bucklesaniel R, MD  dapagliflozin propanediol (FARXIGA) 10 MG TABS tablet Take 1 tablet (10 mg total) by mouth daily. 04/19/20  Yes Rai, Ripudeep K, MD  digoxin (LANOXIN) 0.125 MG tablet Take 1 tablet (0.125 mg total) by mouth daily. 10/19/19  Yes Bensimhon, Bevelyn Bucklesaniel R, MD  ELIQUIS 5 MG TABS tablet TAKE 1 TABLET(5 MG) BY MOUTH TWICE DAILY Patient taking differently: Take 5 mg by mouth 2 (two) times daily. 04/27/20  Yes Robbie LisSimmons, Brittainy M, PA-C  spironolactone (ALDACTONE) 25 MG tablet Take 1/2 tablet (12.5 mg total) by mouth daily. 04/19/20  Yes Rai, Ripudeep K, MD  potassium chloride SA (KLOR-CON) 20 MEQ tablet Take 2 tablets (40 mEq total) by mouth daily. 05/24/20   Melene PlanFloyd, Ahna Konkle, DO  torsemide (DEMADEX) 20 MG tablet Take 2 tablets (40 mg total) by mouth 2 (two) times daily. 05/24/20   Melene PlanFloyd, Keyante Durio, DO    Allergies    Shrimp [shellfish allergy]  Review of Systems   Review of Systems  Constitutional: Negative for chills and fever.  HENT: Negative for congestion and facial swelling.   Eyes: Negative for discharge and visual disturbance.  Respiratory: Positive for shortness of breath.   Cardiovascular: Negative for chest pain and palpitations.  Gastrointestinal: Negative for abdominal pain, diarrhea and vomiting.  Musculoskeletal:  Negative for arthralgias and myalgias.  Skin: Negative for color change and rash.  Neurological: Negative for tremors, syncope and headaches.  Psychiatric/Behavioral: Negative for confusion and dysphoric mood.    Physical Exam Updated Vital Signs BP 110/72   Pulse 70   Temp 98 F (36.7 C) (Oral)   Resp (!) 22   SpO2 91%   Physical Exam Vitals and nursing note reviewed.  Constitutional:  Appearance: He is well-developed.     Comments: BMI 54  HENT:     Head: Normocephalic and atraumatic.  Eyes:     Pupils: Pupils are equal, round, and reactive to light.  Neck:     Vascular: No JVD.  Cardiovascular:     Rate and Rhythm: Normal rate and regular rhythm.     Heart sounds: No murmur heard. No friction rub. No gallop.   Pulmonary:     Effort: No respiratory distress.     Breath sounds: No wheezing.  Abdominal:     General: There is no distension.     Tenderness: There is no guarding or rebound.  Musculoskeletal:        General: Normal range of motion.     Cervical back: Normal range of motion and neck supple.     Right lower leg: Edema present.     Left lower leg: Edema present.     Comments: 4+ to the knees   Skin:    Coloration: Skin is not pale.     Findings: No rash.  Neurological:     Mental Status: He is alert and oriented to person, place, and time.  Psychiatric:        Behavior: Behavior normal.     ED Results / Procedures / Treatments   Labs (all labs ordered are listed, but only abnormal results are displayed) Labs Reviewed  CBC WITH DIFFERENTIAL/PLATELET - Abnormal; Notable for the following components:      Result Value   Abs Immature Granulocytes 0.11 (*)    All other components within normal limits  BASIC METABOLIC PANEL - Abnormal; Notable for the following components:   Glucose, Bld 147 (*)    Creatinine, Ser 1.82 (*)    Calcium 8.8 (*)    GFR, Estimated 44 (*)    All other components within normal limits  BRAIN NATRIURETIC PEPTIDE -  Abnormal; Notable for the following components:   B Natriuretic Peptide 583.6 (*)    All other components within normal limits  I-STAT CHEM 8, ED - Abnormal; Notable for the following components:   BUN 23 (*)    Creatinine, Ser 1.70 (*)    Glucose, Bld 147 (*)    Calcium, Ion 0.96 (*)    All other components within normal limits  TROPONIN I (HIGH SENSITIVITY) - Abnormal; Notable for the following components:   Troponin I (High Sensitivity) 29 (*)    All other components within normal limits  DIGOXIN LEVEL    EKG None  Radiology DG Chest Port 1 View  Result Date: 05/24/2020 CLINICAL DATA:  Shortness of breath EXAM: PORTABLE CHEST 1 VIEW COMPARISON:  04/13/2020 FINDINGS: Unchanged moderate cardiomegaly. Mild pulmonary vascular congestion. Mild patchy bilateral airspace opacities likely combination of atelectasis pulmonary edema. Single lead left chest wall AICD unchanged in configuration. IMPRESSION: Mild interval progression of findings of CHF/fluid volume overload. Electronically Signed   By: Acquanetta Belling M.D.   On: 05/24/2020 08:44    Procedures Procedures   Medications Ordered in ED Medications  furosemide (LASIX) injection 60 mg (60 mg Intravenous Given 05/24/20 0859)  furosemide (LASIX) injection 20 mg (20 mg Intravenous Given 05/24/20 1033)    ED Course  I have reviewed the triage vital signs and the nursing notes.  Pertinent labs & imaging results that were available during my care of the patient were reviewed by me and considered in my medical decision making (see chart for details).    MDM Rules/Calculators/A&P  55 yo M with a cc of shortness of breath and chest pain on exertion.  Going on for about a month now.  Patient clinically with some fluid overload no obvious lung findings.  Will obtain a laboratory evaluation.  Labs and imaging consistent with fluid overload.   Discussed with Dr. Shirlee Latch, agreed with close outpatient follow-up.   Recommend that I sent a message to Dr. Adella Hare.   10:58 AM:  I have discussed the diagnosis/risks/treatment options with the patient and believe the pt to be eligible for discharge home to follow-up with Cards. We also discussed returning to the ED immediately if new or worsening sx occur. We discussed the sx which are most concerning (e.g., sudden worsening pain, fever, inability to tolerate by mouth) that necessitate immediate return. Medications administered to the patient during their visit and any new prescriptions provided to the patient are listed below.  Medications given during this visit Medications  furosemide (LASIX) injection 60 mg (60 mg Intravenous Given 05/24/20 0859)  furosemide (LASIX) injection 20 mg (20 mg Intravenous Given 05/24/20 1033)     The patient appears reasonably screen and/or stabilized for discharge and I doubt any other medical condition or other [facility] requiring further screening, evaluation, or treatment in the ED at this time prior to discharge.   Final Clinical Impression(s) / ED Diagnoses Final diagnoses:  Acute on chronic systolic congestive heart failure (HCC)    Rx / DC Orders ED Discharge Orders         Ordered    torsemide (DEMADEX) 20 MG tablet  2 times daily       Note to Pharmacy: Please cancel all previous orders for current medication. Change in dosage or pill size.   05/24/20 1058    potassium chloride SA (KLOR-CON) 20 MEQ tablet  Daily       Note to Pharmacy: Please cancel all previous orders for current medication. Change in dosage or pill size.   05/24/20 1058           Melene Plan, DO 05/24/20 1059

## 2020-05-24 NOTE — Telephone Encounter (Signed)
Looks like he had labs at PCP office today. SCr was ok and K was 3.7. try increasing spiro to 25 mg daily and repeat BMP on Tuesday of next week.

## 2020-05-25 NOTE — Telephone Encounter (Signed)
Can do spiro 25 to help keep fluid off. F/u BMP in 1 week.

## 2020-05-28 NOTE — Telephone Encounter (Signed)
Increase torsemide to 60 bid. When is his next appointment?

## 2020-05-29 ENCOUNTER — Other Ambulatory Visit (HOSPITAL_COMMUNITY): Payer: Self-pay | Admitting: *Deleted

## 2020-05-29 ENCOUNTER — Other Ambulatory Visit (HOSPITAL_COMMUNITY): Payer: Self-pay

## 2020-05-29 ENCOUNTER — Telehealth (HOSPITAL_COMMUNITY): Payer: Self-pay | Admitting: Pharmacy Technician

## 2020-05-29 MED ORDER — DAPAGLIFLOZIN PROPANEDIOL 10 MG PO TABS: 10.0000 mg | ORAL_TABLET | Freq: Every day | ORAL | 3 refills | Status: AC

## 2020-05-29 NOTE — Telephone Encounter (Signed)
Advanced Heart Failure Patient Advocate Encounter  Was able to obtain a co-pay card for [place].  BIN [id number]  PCN [id number]  Group [id number]  ID [id number]  Attached to RX sent to the patient's pharmacy  Archer Asa, CPhT

## 2020-05-30 ENCOUNTER — Other Ambulatory Visit (HOSPITAL_COMMUNITY): Payer: Self-pay

## 2020-05-30 MED ORDER — AMIODARONE HCL 200 MG PO TABS: 200.0000 mg | ORAL_TABLET | Freq: Every day | ORAL | 0 refills | Status: DC

## 2020-06-01 ENCOUNTER — Encounter (HOSPITAL_COMMUNITY): Payer: Self-pay

## 2020-06-01 ENCOUNTER — Other Ambulatory Visit: Payer: Self-pay

## 2020-06-01 ENCOUNTER — Inpatient Hospital Stay (HOSPITAL_COMMUNITY)
Admission: EM | Admit: 2020-06-01 | Discharge: 2020-06-10 | DRG: 309 | Disposition: A | Discharge status: Home / Self Care | Payer: BC Managed Care – PPO | Attending: Internal Medicine | Admitting: Internal Medicine

## 2020-06-01 ENCOUNTER — Emergency Department (HOSPITAL_COMMUNITY): Payer: BC Managed Care – PPO

## 2020-06-01 DIAGNOSIS — Z95810 Presence of automatic (implantable) cardiac defibrillator: Secondary | ICD-10-CM

## 2020-06-01 DIAGNOSIS — R32 Unspecified urinary incontinence: Secondary | ICD-10-CM | POA: Diagnosis present

## 2020-06-01 DIAGNOSIS — R0600 Dyspnea, unspecified: Secondary | ICD-10-CM | POA: Diagnosis present

## 2020-06-01 DIAGNOSIS — I5023 Acute on chronic systolic (congestive) heart failure: Secondary | ICD-10-CM

## 2020-06-01 DIAGNOSIS — Z8249 Family history of ischemic heart disease and other diseases of the circulatory system: Secondary | ICD-10-CM

## 2020-06-01 DIAGNOSIS — R55 Syncope and collapse: Secondary | ICD-10-CM | POA: Diagnosis present

## 2020-06-01 DIAGNOSIS — I480 Paroxysmal atrial fibrillation: Secondary | ICD-10-CM | POA: Diagnosis present

## 2020-06-01 DIAGNOSIS — Z6841 Body Mass Index (BMI) 40.0 and over, adult: Secondary | ICD-10-CM

## 2020-06-01 DIAGNOSIS — I471 Supraventricular tachycardia: Secondary | ICD-10-CM | POA: Diagnosis present

## 2020-06-01 DIAGNOSIS — I472 Ventricular tachycardia, unspecified: Secondary | ICD-10-CM

## 2020-06-01 DIAGNOSIS — Z96651 Presence of right artificial knee joint: Secondary | ICD-10-CM | POA: Diagnosis present

## 2020-06-01 DIAGNOSIS — I872 Venous insufficiency (chronic) (peripheral): Secondary | ICD-10-CM | POA: Diagnosis present

## 2020-06-01 DIAGNOSIS — W19XXXA Unspecified fall, initial encounter: Secondary | ICD-10-CM | POA: Diagnosis present

## 2020-06-01 DIAGNOSIS — I493 Ventricular premature depolarization: Secondary | ICD-10-CM | POA: Diagnosis present

## 2020-06-01 DIAGNOSIS — I4901 Ventricular fibrillation: Secondary | ICD-10-CM | POA: Diagnosis present

## 2020-06-01 DIAGNOSIS — N1832 Chronic kidney disease, stage 3b: Secondary | ICD-10-CM

## 2020-06-01 DIAGNOSIS — D6862 Lupus anticoagulant syndrome: Secondary | ICD-10-CM | POA: Diagnosis present

## 2020-06-01 DIAGNOSIS — M109 Gout, unspecified: Secondary | ICD-10-CM | POA: Diagnosis present

## 2020-06-01 DIAGNOSIS — Z7189 Other specified counseling: Secondary | ICD-10-CM | POA: Diagnosis not present

## 2020-06-01 DIAGNOSIS — I5022 Chronic systolic (congestive) heart failure: Secondary | ICD-10-CM

## 2020-06-01 DIAGNOSIS — Y92239 Unspecified place in hospital as the place of occurrence of the external cause: Secondary | ICD-10-CM | POA: Diagnosis present

## 2020-06-01 DIAGNOSIS — I5084 End stage heart failure: Secondary | ICD-10-CM | POA: Diagnosis present

## 2020-06-01 DIAGNOSIS — I2510 Atherosclerotic heart disease of native coronary artery without angina pectoris: Secondary | ICD-10-CM | POA: Diagnosis present

## 2020-06-01 DIAGNOSIS — Z7901 Long term (current) use of anticoagulants: Secondary | ICD-10-CM | POA: Diagnosis not present

## 2020-06-01 DIAGNOSIS — N179 Acute kidney failure, unspecified: Secondary | ICD-10-CM | POA: Diagnosis not present

## 2020-06-01 DIAGNOSIS — Z833 Family history of diabetes mellitus: Secondary | ICD-10-CM

## 2020-06-01 DIAGNOSIS — I5042 Chronic combined systolic (congestive) and diastolic (congestive) heart failure: Secondary | ICD-10-CM

## 2020-06-01 DIAGNOSIS — Z91013 Allergy to seafood: Secondary | ICD-10-CM

## 2020-06-01 DIAGNOSIS — Z86711 Personal history of pulmonary embolism: Secondary | ICD-10-CM | POA: Diagnosis not present

## 2020-06-01 DIAGNOSIS — S51011A Laceration without foreign body of right elbow, initial encounter: Secondary | ICD-10-CM | POA: Diagnosis present

## 2020-06-01 DIAGNOSIS — Z832 Family history of diseases of the blood and blood-forming organs and certain disorders involving the immune mechanism: Secondary | ICD-10-CM | POA: Diagnosis not present

## 2020-06-01 DIAGNOSIS — Z9049 Acquired absence of other specified parts of digestive tract: Secondary | ICD-10-CM | POA: Diagnosis not present

## 2020-06-01 DIAGNOSIS — I428 Other cardiomyopathies: Secondary | ICD-10-CM | POA: Diagnosis present

## 2020-06-01 DIAGNOSIS — Z515 Encounter for palliative care: Secondary | ICD-10-CM

## 2020-06-01 DIAGNOSIS — Z20822 Contact with and (suspected) exposure to covid-19: Secondary | ICD-10-CM | POA: Diagnosis present

## 2020-06-01 DIAGNOSIS — E876 Hypokalemia: Secondary | ICD-10-CM | POA: Diagnosis not present

## 2020-06-01 DIAGNOSIS — N183 Chronic kidney disease, stage 3 unspecified: Secondary | ICD-10-CM | POA: Diagnosis not present

## 2020-06-01 DIAGNOSIS — R112 Nausea with vomiting, unspecified: Secondary | ICD-10-CM | POA: Diagnosis not present

## 2020-06-01 DIAGNOSIS — Z86718 Personal history of other venous thrombosis and embolism: Secondary | ICD-10-CM | POA: Diagnosis not present

## 2020-06-01 DIAGNOSIS — I484 Atypical atrial flutter: Secondary | ICD-10-CM | POA: Diagnosis present

## 2020-06-01 DIAGNOSIS — T504X5A Adverse effect of drugs affecting uric acid metabolism, initial encounter: Secondary | ICD-10-CM | POA: Diagnosis present

## 2020-06-01 DIAGNOSIS — Z79899 Other long term (current) drug therapy: Secondary | ICD-10-CM

## 2020-06-01 HISTORY — DX: Ventricular tachycardia, unspecified: I47.20

## 2020-06-01 HISTORY — DX: Venous insufficiency (chronic) (peripheral): I87.2

## 2020-06-01 HISTORY — DX: Ventricular tachycardia: I47.2

## 2020-06-01 LAB — RAPID URINE DRUG SCREEN, HOSP PERFORMED
Amphetamines: NOT DETECTED
Barbiturates: NOT DETECTED
Benzodiazepines: NOT DETECTED
Cocaine: NOT DETECTED
Opiates: NOT DETECTED
Tetrahydrocannabinol: NOT DETECTED

## 2020-06-01 LAB — MAGNESIUM: Magnesium: 1.7 mg/dL (ref 1.7–2.4)

## 2020-06-01 LAB — CBC WITH DIFFERENTIAL/PLATELET
Abs Immature Granulocytes: 0.11 10*3/uL — ABNORMAL HIGH (ref 0.00–0.07)
Basophils Absolute: 0.1 10*3/uL (ref 0.0–0.1)
Basophils Relative: 1 %
Eosinophils Absolute: 0.1 10*3/uL (ref 0.0–0.5)
Eosinophils Relative: 1 %
HCT: 49.3 % (ref 39.0–52.0)
Hemoglobin: 16.2 g/dL (ref 13.0–17.0)
Immature Granulocytes: 1 %
Lymphocytes Relative: 18 %
Lymphs Abs: 1.8 10*3/uL (ref 0.7–4.0)
MCH: 30.4 pg (ref 26.0–34.0)
MCHC: 32.9 g/dL (ref 30.0–36.0)
MCV: 92.5 fL (ref 80.0–100.0)
Monocytes Absolute: 0.9 10*3/uL (ref 0.1–1.0)
Monocytes Relative: 9 %
Neutro Abs: 6.6 10*3/uL (ref 1.7–7.7)
Neutrophils Relative %: 70 %
Platelets: 160 10*3/uL (ref 150–400)
RBC: 5.33 MIL/uL (ref 4.22–5.81)
RDW: 13.7 % (ref 11.5–15.5)
WBC: 9.5 10*3/uL (ref 4.0–10.5)
nRBC: 0 % (ref 0.0–0.2)

## 2020-06-01 LAB — BASIC METABOLIC PANEL
Anion gap: 9 (ref 5–15)
BUN: 21 mg/dL — ABNORMAL HIGH (ref 6–20)
CO2: 24 mmol/L (ref 22–32)
Calcium: 8.6 mg/dL — ABNORMAL LOW (ref 8.9–10.3)
Chloride: 103 mmol/L (ref 98–111)
Creatinine, Ser: 2.11 mg/dL — ABNORMAL HIGH (ref 0.61–1.24)
GFR, Estimated: 37 mL/min — ABNORMAL LOW (ref >60)
Glucose, Bld: 143 mg/dL — ABNORMAL HIGH (ref 70–99)
Potassium: 3.4 mmol/L — ABNORMAL LOW (ref 3.5–5.1)
Sodium: 136 mmol/L (ref 135–145)

## 2020-06-01 LAB — URINALYSIS, ROUTINE W REFLEX MICROSCOPIC
Bacteria, UA: NONE SEEN
Bilirubin Urine: NEGATIVE
Glucose, UA: >=500 mg/dL — AB
Ketones, ur: NEGATIVE mg/dL
Leukocytes,Ua: NEGATIVE
Nitrite: NEGATIVE
Protein, ur: 100 mg/dL — AB
Specific Gravity, Urine: 1.003 — ABNORMAL LOW (ref 1.005–1.030)
pH: 5 (ref 5.0–8.0)

## 2020-06-01 LAB — RESP PANEL BY RT-PCR (FLU A&B, COVID) ARPGX2
Influenza A by PCR: NEGATIVE
Influenza B by PCR: NEGATIVE
SARS Coronavirus 2 by RT PCR: NEGATIVE

## 2020-06-01 LAB — TROPONIN I (HIGH SENSITIVITY)
Troponin I (High Sensitivity): 35 ng/L — ABNORMAL HIGH (ref <18)
Troponin I (High Sensitivity): 31 ng/L — ABNORMAL HIGH (ref <18)

## 2020-06-01 LAB — BRAIN NATRIURETIC PEPTIDE: B Natriuretic Peptide: 773.7 pg/mL — ABNORMAL HIGH (ref 0.0–100.0)

## 2020-06-01 LAB — HIV ANTIBODY (ROUTINE TESTING W REFLEX): HIV Screen 4th Generation wRfx: NONREACTIVE

## 2020-06-01 LAB — DIGOXIN LEVEL: Digoxin Level: 0.2 ng/mL — ABNORMAL LOW (ref 0.8–2.0)

## 2020-06-01 MED ORDER — SODIUM CHLORIDE 0.9 % IV SOLN: 250.0000 mL | INTRAVENOUS | Status: DC | PRN

## 2020-06-01 MED ORDER — SPIRONOLACTONE 25 MG PO TABS
25.0000 mg | ORAL_TABLET | Freq: Every day | ORAL | Status: DC
  Administered 2020-06-02 – 2020-06-04 (×3): 25 mg via ORAL
  Filled 2020-06-01 (×3): qty 1

## 2020-06-01 MED ORDER — AMIODARONE HCL IN DEXTROSE 360-4.14 MG/200ML-% IV SOLN
30.0000 mg/h | INTRAVENOUS | Status: DC
  Administered 2020-06-01 – 2020-06-03 (×5): 30 mg/h via INTRAVENOUS
  Filled 2020-06-01 (×7): qty 200

## 2020-06-01 MED ORDER — SODIUM CHLORIDE 0.9% FLUSH: 3.0000 mL | INTRAVENOUS | Status: DC | PRN

## 2020-06-01 MED ORDER — TORSEMIDE 20 MG PO TABS
60.0000 mg | ORAL_TABLET | Freq: Two times a day (BID) | ORAL | Status: DC
  Administered 2020-06-01 – 2020-06-08 (×14): 60 mg via ORAL
  Filled 2020-06-01 (×14): qty 3

## 2020-06-01 MED ORDER — DAPAGLIFLOZIN PROPANEDIOL 10 MG PO TABS
10.0000 mg | ORAL_TABLET | Freq: Every day | ORAL | Status: DC
  Administered 2020-06-02 – 2020-06-10 (×9): 10 mg via ORAL
  Filled 2020-06-01 (×9): qty 1

## 2020-06-01 MED ORDER — POTASSIUM CHLORIDE CRYS ER 20 MEQ PO TBCR: 20.0000 meq | EXTENDED_RELEASE_TABLET | Freq: Once | ORAL | Status: DC

## 2020-06-01 MED ORDER — ONDANSETRON HCL 4 MG/2ML IJ SOLN
4.0000 mg | Freq: Four times a day (QID) | INTRAMUSCULAR | Status: DC | PRN
  Administered 2020-06-08: 4 mg via INTRAVENOUS
  Filled 2020-06-01 (×2): qty 2

## 2020-06-01 MED ORDER — AMIODARONE LOAD VIA INFUSION
150.0000 mg | Freq: Once | INTRAVENOUS | Status: AC
  Administered 2020-06-01: 150 mg via INTRAVENOUS
  Filled 2020-06-01: qty 83.34

## 2020-06-01 MED ORDER — APIXABAN 5 MG PO TABS
5.0000 mg | ORAL_TABLET | Freq: Two times a day (BID) | ORAL | Status: DC
  Administered 2020-06-01 – 2020-06-10 (×18): 5 mg via ORAL
  Filled 2020-06-01 (×18): qty 1

## 2020-06-01 MED ORDER — POTASSIUM CHLORIDE CRYS ER 20 MEQ PO TBCR
40.0000 meq | EXTENDED_RELEASE_TABLET | Freq: Every day | ORAL | Status: DC
  Administered 2020-06-02: 40 meq via ORAL
  Filled 2020-06-01: qty 2

## 2020-06-01 MED ORDER — DIGOXIN 125 MCG PO TABS
0.1250 mg | ORAL_TABLET | Freq: Every day | ORAL | Status: DC
  Administered 2020-06-02 – 2020-06-10 (×9): 0.125 mg via ORAL
  Filled 2020-06-01 (×9): qty 1

## 2020-06-01 MED ORDER — SODIUM CHLORIDE 0.9% FLUSH
3.0000 mL | Freq: Two times a day (BID) | INTRAVENOUS | Status: DC
  Administered 2020-06-02 – 2020-06-10 (×13): 3 mL via INTRAVENOUS

## 2020-06-01 MED ORDER — ACETAMINOPHEN 325 MG PO TABS
650.0000 mg | ORAL_TABLET | ORAL | Status: DC | PRN
  Administered 2020-06-01 – 2020-06-09 (×9): 650 mg via ORAL
  Filled 2020-06-01 (×9): qty 2

## 2020-06-01 MED ORDER — AMIODARONE HCL IN DEXTROSE 360-4.14 MG/200ML-% IV SOLN
60.0000 mg/h | INTRAVENOUS | Status: DC
  Administered 2020-06-01 (×2): 60 mg/h via INTRAVENOUS

## 2020-06-01 MED ORDER — MAGNESIUM SULFATE 4 GM/100ML IV SOLN
4.0000 g | Freq: Once | INTRAVENOUS | Status: AC
  Administered 2020-06-01: 4 g via INTRAVENOUS
  Filled 2020-06-01: qty 100

## 2020-06-01 MED ORDER — CARVEDILOL 3.125 MG PO TABS
3.1250 mg | ORAL_TABLET | Freq: Two times a day (BID) | ORAL | Status: DC
  Administered 2020-06-01 – 2020-06-07 (×12): 3.125 mg via ORAL
  Filled 2020-06-01 (×12): qty 1

## 2020-06-01 MED ORDER — POTASSIUM CHLORIDE CRYS ER 20 MEQ PO TBCR
40.0000 meq | EXTENDED_RELEASE_TABLET | Freq: Once | ORAL | Status: AC
  Administered 2020-06-01: 40 meq via ORAL
  Filled 2020-06-01: qty 2

## 2020-06-01 MED ORDER — POTASSIUM CHLORIDE CRYS ER 20 MEQ PO TBCR
40.0000 meq | EXTENDED_RELEASE_TABLET | Freq: Once | ORAL | Status: AC
  Administered 2020-06-01: 40 meq via ORAL
  Filled 2020-06-01 (×2): qty 2

## 2020-06-01 NOTE — H&P (Addendum)
ELECTROPHYSIOLOGY H&P NOTE    Patient ID: Roberto [name] MRN: [id number], DOB/AGE: April 20, 1965 55 y.o.  Admit date: 06/01/2020 Date of Consult: 06/01/2020  Primary Physician: [name], MD Primary Cardiologist: Arvilla Meres, MD  Electrophysiologist: Dr. Elberta Fortis  Reason for Admission: ICD shock  Patient Profile: Roberto [name] is a 56 y.o. male with a h/o systolic HF due to NICM, h/o recurrent DVT with resultant PE and chronic venous insufficiency, h/o LV thrombus (on Coumadin), PSVT and morbid obesity.   HPI:  Roberto [name] is a 55 y.o. male with medical history as above. Followed closely by HF team. Last seen 05/17/2020  Pt had admission in 07-Jan-2020 with VT -> ICD shock in the setting of hypokalemia. Diuretics and spironolactone have been adjusted. 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 He did have loss of bladder and was unresponsive for a short time. He was on his way to the bathroom. Skin tear right elbow noted, unsure if he hit his head.   Pertinent labs on admission include K 3.4, Cr 2.1, Mg 1.7, HS-Trop 35, WBC 9.5, Hgb 16.2. COVID pending. Dig level pending.  ICD interrogation shows as below that patient had VF with appropriate therapy this am with CL as fast as 160 ms (>300 bpm)  Patient states he was seen in the ED last week for SOB. K borderline low at 3.7. Torsemide was increased with continued SOB and edema, but K was not adjusted. Had syncope while walking to the bathroom today. Has been taking torsemide 60 mg BID and potassium 40 meq daily. This is the first time he has passed out since 01/07/2023.  He remains SOB on flat ground after approx 50 yards, not with dressing or bathing. DOE on inclines. He has felt "slightly" better since torsemide increase. He was not aware of the ICD shock due to syncope.   Past Medical History:  Diagnosis Date    AICD (automatic cardioverter/defibrillator) present 08/2018   Medtronic ICD   Arthritis    knee   Chronic combined systolic and diastolic CHF (congestive heart failure) (HCC)    Coronary artery disease    Deep vein thrombosis (HCC) 2007   left leg   Diverticulitis    Hepatitis    patient denies this dx   Lupus anticoagulant positive    Morbid obesity (HCC)    Morbid obesity with BMI of 50.0-59.9, adult (HCC)    Mural thrombus of heart    coumadin   Nonischemic cardiomyopathy (HCC)    a) 12/17/11 echo: LVEF 25-30%, grade 3 diastolic dysfunction (c/w restriction), mod MR, mod LA/LV and mild RA dilatation; b) 12/18/11 cMRI: LVEF 38%, mod LV/mild RV dilatation, global HK, mild-mod RV sys dysfxn, no LV thrombus & patchy non-subendocardial delayed enhancement c/w infil dz or prior myocarditis; c. 07/2018 EF 25-30%.   Paroxysmal SVT (supraventricular tachycardia) (HCC)    Pneumonia    x 2   Pulmonary embolism (HCC)    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   Wears glasses    Wound of abdomen      Surgical History:  Past Surgical History:  Procedure Laterality Date   A-FLUTTER ABLATION N/A 11/17/2016   Procedure: A-FLUTTER ABLATION;  Surgeon: Regan Lemming, MD;  Location: [facility] INVASIVE CV LAB;  Service: Cardiovascular;  Laterality: N/A;   APPLICATION OF  WOUND VAC N/A 09/05/2019   Procedure: APPLICATION OF WOUND VAC;  Surgeon: Allena Napoleon, MD;  Location: [facility] OR;  Service: Plastics;  Laterality: N/A;  PATIENT'S HOME WOUND VAC APPLIED.   BIOPSY  12/23/2018   Procedure: BIOPSY;  Surgeon: Meridee Score Netty Starring., MD;  Location: [facility] ENDOSCOPY;  Service: Gastroenterology;;   BIOPSY  01/23/2020   Procedure: BIOPSY;  Surgeon: Lemar Lofty., MD;  Location: [facility] ENDOSCOPY;  Service: Gastroenterology;;   CARDIAC CATHETERIZATION  06/2006   Angiographically normal cors   CARDIAC CATHETERIZATION  12/22/2011   R/LHC: normal cors, well-compensated HDs, LV dysfxn   CARDIOVERSION N/A 10/17/2016    Procedure: CARDIOVERSION;  Surgeon: Dolores Patty, MD;  Location: [facility] ENDOSCOPY;  Service: Cardiovascular;  Laterality: N/A;   CARDIOVERSION N/A 03/20/2017   Procedure: CARDIOVERSION;  Surgeon: Dolores Patty, MD;  Location: [facility] ENDOSCOPY;  Service: Cardiovascular;  Laterality: N/A;   COLECTOMY N/A 12/28/2018   Procedure: TOTAL COLECTOMY;  Surgeon: Emelia Loron, MD;  Location: [facility] OR;  Service: General;  Laterality: N/A;   FLEXIBLE SIGMOIDOSCOPY N/A 12/23/2018   Procedure: FLEXIBLE SIGMOIDOSCOPY;  Surgeon: Lemar Lofty., MD;  Location: [facility] ENDOSCOPY;  Service: Gastroenterology;  Laterality: N/A;   FLEXIBLE SIGMOIDOSCOPY N/A 01/23/2020   Procedure: FLEXIBLE SIGMOIDOSCOPY;  Surgeon: Meridee Score Netty Starring., MD;  Location: [facility] ENDOSCOPY;  Service: Gastroenterology;  Laterality: N/A;   ICD IMPLANT N/A 09/03/2018   Procedure: ICD Medtronic IMPLANT;  Surgeon: Regan Lemming, MD;  Location: [facility] INVASIVE CV LAB;  Service: Cardiovascular;  Laterality: N/A;   ILEOSTOMY N/A 12/28/2018   Procedure: ILEOSTOMY;  Surgeon: Emelia Loron, MD;  Location: [facility] OR;  Service: General;  Laterality: N/A;   IR RADIOLOGIST EVAL & MGMT  02/08/2019   JOINT REPLACEMENT     right knee   LEFT AND RIGHT HEART CATHETERIZATION WITH CORONARY ANGIOGRAM N/A 12/22/2011   Procedure: LEFT AND RIGHT HEART CATHETERIZATION WITH CORONARY ANGIOGRAM;  Surgeon: Dolores Patty, MD;  Location: [facility] CATH LAB;  Service: Cardiovascular;  Laterality: N/A;   PATELLAR TENDON REPAIR     Left   RIGHT HEART CATH N/A 10/20/2016   Procedure: RIGHT HEART CATH;  Surgeon: Dolores Patty, MD;  Location: [facility] CV [facility];  Service: Cardiovascular;  Laterality: N/A;   RIGHT HEART CATH N/A 04/17/2020   Procedure: RIGHT HEART CATH;  Surgeon: Dolores Patty, MD;  Location: [facility] INVASIVE CV LAB;  Service: Cardiovascular;  Laterality: N/A;   SKIN SPLIT GRAFT N/A 09/05/2019   Procedure: SKIN GRAFT SPLIT THICKNESS;  Surgeon:  Allena Napoleon, MD;  Location: [facility] OR;  Service: Plastics;  Laterality: N/A;   TEE WITHOUT CARDIOVERSION N/A 03/20/2017   Procedure: TRANSESOPHAGEAL ECHOCARDIOGRAM (TEE);  Surgeon: Dolores Patty, MD;  Location: [facility] ENDOSCOPY;  Service: Cardiovascular;  Laterality: N/A;     (Not in a hospital admission)   Inpatient Medications:   amiodarone  150 mg Intravenous Once    Allergies:  Allergies  Allergen Reactions   Shrimp [Shellfish Allergy] Hives and Itching    Social History   Socioeconomic History   Marital status: Divorced    Spouse name: Not on file   Number of children: 5   Years of education: Not on file   Highest education level: Not on file  Occupational History   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[employer]    Employer: [employer]  Tobacco Use   Smoking status: Never Smoker   Smokeless tobacco: Never Used  Advertising account planner  Vaping Use: Never used  Substance and Sexual Activity   Alcohol use: Not Currently    Comment: Rarely   Drug use: No   Sexual activity: Not on file  Other Topics Concern   Not on file  Social History Narrative   Lives in [place] 03/2015, 5 children.   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  Exercises with weights and some walking. 12/2015   Social Determinants of Health   Financial Resource Strain: Low Risk    Difficulty of Paying Living Expenses: Not very hard  Food Insecurity: No Food Insecurity   Worried About Programme researcher, broadcasting/film/video in the Last Year: Never true   Ran Out of Food in the Last Year: Never true  Transportation Needs: No Transportation Needs   Lack of Transportation (Medical): No   Lack of Transportation (Non-Medical): No  Physical Activity: Not on file  Stress: Not on file  Social Connections: Not on file  Intimate Partner Violence: Not on file     Family History  Problem Relation Age of Onset   Hypertension Mother    Clotting disorder Mother        mom  with PEs   Cancer Mother        uterine cancer   Diabetes Father    Heart attack Father    Hypertension Other        Sibling   Diabetes Other    Obesity Other    Diabetes Sister    Obesity Sister    Obesity Sister    Colon cancer Neg Hx    Stomach cancer Neg Hx    Esophageal cancer Neg Hx    Rectal cancer Neg Hx    Inflammatory bowel disease Neg Hx    Liver disease Neg Hx    Pancreatic cancer Neg Hx      Review of Systems: All other systems reviewed and are otherwise negative except as noted above.  Physical Exam: Vitals:   06/01/20 0856 06/01/20 0900  BP:  111/72  Pulse:  72  Resp:  20  Temp:  98.6 F (37 C)  TempSrc:  Oral  SpO2:  95%  Weight: (!) 162.5 kg   Height: 5\' 8"  (1.727 m)     GEN- The patient is well appearing, alert and oriented x 3 today.   HEENT: normocephalic, atraumatic; sclera clear, conjunctiva pink; hearing intact; oropharynx clear; neck supple  JVP difficult due to body habitus but appears at least 9-10 cm+ Lungs- Clear to ausculation bilaterally, normal work of breathing.  No wheezes, rales, rhonchi Heart- Regular rate and rhythm, no murmurs, rubs or gallops GI- Obese, soft, non-tender, non-distended, bowel sounds present Extremities- no clubbing or cyanosis. 2+ edema to knees. Mild dependent thigh edema DP/PT/radial pulses 2+ bilaterally MS- no significant deformity or atrophy Skin- warm and dry, no rash or lesion Psych- euthymic mood, full affect Neuro- strength and sensation are intact  Labs:   Lab Results  Component Value Date   WBC 9.5 06/01/2020   HGB 16.2 06/01/2020   HCT 49.3 06/01/2020   MCV 92.5 06/01/2020   PLT 160 06/01/2020   No results for input(s): NA, K, CL, CO2, BUN, CREATININE, CALCIUM, PROT, BILITOT, ALKPHOS, ALT, AST, GLUCOSE in the last 168 hours.  Invalid input(s): LABALBU    Radiology/Studies: DG Chest Portable 1 View  Result Date: 06/01/2020 CLINICAL DATA:  55 year old male with shortness of breath and  syncope. EXAM: PORTABLE CHEST 1 VIEW COMPARISON:  Portable chest 05/24/2020 and earlier. FINDINGS: Portable AP semi upright view at and 0924 hours. Stable cardiomegaly and mediastinal contours. Stable left chest AICD. Decreased pulmonary vascularity. Allowing for portable technique the lungs are clear. No pneumothorax or pleural effusion. Visualized tracheal air column is within normal limits. No acute osseous abnormality identified. IMPRESSION: Pulmonary edema appears resolved since 05/24/2020. Cardiomegaly with no acute cardiopulmonary abnormality. Electronically Signed   By: Odessa Fleming M.D.   On: 06/01/2020 09:41   DG Chest Port 1 View  Result Date: 05/24/2020 CLINICAL DATA:  Shortness of breath EXAM: PORTABLE CHEST 1 VIEW COMPARISON:  04/13/2020 FINDINGS: Unchanged moderate cardiomegaly. Mild pulmonary vascular congestion. Mild patchy bilateral airspace opacities likely combination of atelectasis pulmonary edema. Single lead left chest wall AICD unchanged in configuration. IMPRESSION: Mild interval progression of findings of CHF/fluid volume overload. Electronically Signed   By: Acquanetta Belling M.D.   On: 05/24/2020 08:44     EKG: NSR 71, IVCD 138 ms  (personally reviewed)  TELEMETRY: NSR with occasional PVCs 70-80s (personally reviewed)  DEVICE HISTORY: Medtronic ICD placed 09/03/2018 for NICM H/o appropriate therapies on amiodarone  Assessment/Plan: 1.  VF with ICD shock In the setting of Hypokalemia and hypomagnesemia with recent torsemide increase Re-bolus amiodarone. Will need higher oral dose going home for at least short time. Keep K > 4.0 and Mg > 2.0   2. A/C Systolic CHF due to NICM Pt remains volume overloaded with NYHA III symptoms HF team aware of patient admission.  Home meds continued, Will defer diuretic dosing to CHF team.  3. CKD IIIb Baseline 1.8-2.0 Cr 2.1. Follow with diuresis  4. Super morbid obesity Body mass index is 54.47 kg/m.   Will admit for evaluation and  treatment. Would like patient to be 48 hours VT free prior to d/c.   For questions or updates, please contact [facility] Please consult [email or web address] for contact info under Cardiology/STEMI.  Dustin Flock, PA-C  06/01/2020 9:45 AM  EP Attending  Patient seen and examined. Agree with above. The patient presents with syncope due to ventricular fibrillation. He has had worsening CHF. His potassium and magnesium are decreased. He has been on low dose amiodarone. He will be admitted for IV amiodarone. He will need input from our CHF team as he notes worsening dyspnea. He denies anginal symptoms and his troponins are negative. I do not feel strongly about a heart cath. I would anticipate discharge home on Monday with transition to oral amio on Sunday after 48 hours. He desperately needs to lose weight.  Sharlot Gowda Palak Tercero,MD

## 2020-06-01 NOTE — ED Notes (Signed)
Attempted report x1. 

## 2020-06-01 NOTE — ED Notes (Signed)
Patient transported to CT 

## 2020-06-01 NOTE — ED Provider Notes (Signed)
[facility] EMERGENCY DEPARTMENT Provider Note   CSN: [id number] Arrival date & time: 06/01/20  0847     History Chief Complaint  Patient presents with  . Loss of Consciousness    NAZARIY DUNKLEY is a 55 y.o. male medical history significant for chronic combined systolic and diastolic CHF, Medtronic AICD left leg DVT, PE CAD, nonischemic cardiomyopathy, paroxysmal afib, colon resection with ostomy. Anticoagulated on eliquis.  Cardiologist is Dr. Gala Romney.  Echo 12/2019 with LVEF of 25 to 30%.  HPI Patient presents to emergency department today viz EMS with chief complaint of loss of consciousness happening just prior to arrival.  Patient states he was walking into work today he was feeling short of breath.  He works as a Oceanographer at [employer].  He states when he got to his desk he sat down and shortness of breath resolved.  He got up to walk to the bathroom and had loss of consciousness.  This was witnessed by 2 of his coworkers.  Patient thinks he was unresponsive for 2 to 3 minutes, unsure exactly however.  He did have loss of bladder.  He states the witnesses did not see any seizure-like activity.  He thinks he had loss of bladder because he was on his way to the bathroom to urinate.  He does not think he hit his head although is unsure.  He has a skin tear to right elbow.  Patient admits to being confused after the loss of consciousness.  He is describes it as not knowing what happened.  He denies any prodrome of chest pain, dizziness or lightheadedness.  He states at the time of ambulation to the bathroom he was asymptomatic.  He denies any recent illness. He admits to having a mild headache right now. He rates pain 1/10 in severity.  Patient denies any fever, chills, chest pain, palpitations, nausea, vomiting, dizziness. Patient admits to syncope in the past.  He states in December of last year he had an episode of syncope and was admitted to the  hospital after interrogation found wide-complex tachyarrhythmia with a ventricular rate of 220 spontaneously debrillated via internal defibrillator. He denies any prodrome of that time.  Patient reports having increased shortness of breath over the last several weeks.  He saw cardiologist last week and his Lasix was increased to 60 mg twice daily.  He feels like this has been helping.  Past Medical History:  Diagnosis Date  . AICD (automatic cardioverter/defibrillator) present 08/2018   Medtronic ICD  . Arthritis    knee  . Chronic combined systolic and diastolic CHF (congestive heart failure) (HCC)   . Coronary artery disease   . Deep vein thrombosis (HCC) 2007   left leg  . Diverticulitis   . Hepatitis    patient denies this dx  . Lupus anticoagulant positive   . Morbid obesity (HCC)   . Morbid obesity with BMI of 50.0-59.9, adult (HCC)   . Mural thrombus of heart    coumadin  . Nonischemic cardiomyopathy (HCC)    a) 12/17/11 echo: LVEF 25-30%, grade 3 diastolic dysfunction (c/w restriction), mod MR, mod LA/LV and mild RA dilatation; b) 12/18/11 cMRI: LVEF 38%, mod LV/mild RV dilatation, global HK, mild-mod RV sys dysfxn, no LV thrombus & patchy non-subendocardial delayed enhancement c/w infil dz or prior myocarditis; c. 07/2018 EF 25-30%.  . Paroxysmal SVT (supraventricular tachycardia) (HCC)   . Pneumonia    x 2  . Pulmonary embolism (HCC)  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  . Wears glasses   . Wound of abdomen     Patient Active Problem List   Diagnosis Date Noted  . Ventricular tachyarrhythmia (HCC) 06/01/2020  . Acute on chronic combined systolic and diastolic CHF (congestive heart failure) (HCC) 04/13/2020  . S/P implantation of automatic cardioverter/defibrillator (AICD) 04/13/2020  . Hx of diverticulitis of colon 12/06/2019  . History of colon resection 12/06/2019  . History of creation of ostomy (HCC) 12/06/2019  . History of colonic polyps 12/06/2019  .  Hypomagnesemia   . 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 History of DVT (deep vein thrombosis) 09/04/2018  . PAF (paroxysmal atrial fibrillation) (HCC) 09/04/2018  . Elevated uric acid in blood 05/06/2018  . Atypical atrial flutter (HCC) 03/17/2017  . Chronic pain of right knee 03/13/2017  . Typical atrial flutter s/p catheter ablation   . Varicose veins of legs 12/17/2015  . Encounter for health maintenance examination in adult 12/17/2015  . Special screening for malignant neoplasms, colon 12/17/2015  . Vaccine counseling 12/17/2015  . Knee deformity, acquired, right 12/17/2015  . Gait disturbance 12/17/2015  . Osteoarthritis of right knee 12/17/2015  . Varicose veins of lower extremities with ulcer (HCC) 09/18/2015  . Need for prophylactic vaccination and inoculation against influenza 09/18/2015  . Tinea versicolor 06/15/2015  . Impaired fasting blood sugar 12/28/2014  . Chronic anticoagulation 12/28/2014  . Erectile dysfunction 02/19/2012  . Chronic systolic heart failure (HCC) 12/29/2011  . Hypokalemia 12/24/2011  . Lupus anticoagulant positive 12/22/2011  . Nonischemic cardiomyopathy (HCC) 12/16/2011  . Paroxysmal SVT (supraventricular tachycardia) (HCC) 12/16/2011  . History of venous thromboembolism 12/16/2011  . Venous insufficiency 11/04/2011  . Morbid obesity with BMI of 50.0-59.9, adult (HCC) 06/02/2008    Past Surgical History:  Procedure Laterality Date  . A-FLUTTER ABLATION N/A 11/17/2016   Procedure: A-FLUTTER ABLATION;  Surgeon: Regan Lemming, MD;  Location: [facility] INVASIVE CV LAB;  Service: Cardiovascular;  Laterality: N/A;  . APPLICATION OF WOUND VAC N/A 09/05/2019   Procedure: APPLICATION OF WOUND VAC;  Surgeon: Allena Napoleon, MD;  Location: [facility] OR;  Service: Plastics;  Laterality: N/A;  PATIENT'S HOME WOUND VAC APPLIED.  [name] BIOPSY  12/23/2018   Procedure: BIOPSY;  Surgeon: Meridee Score Netty Starring., MD;  Location: [facility] ENDOSCOPY;   Service: Gastroenterology;;  . BIOPSY  01/23/2020   Procedure: BIOPSY;  Surgeon: Lemar Lofty., MD;  Location: [facility] ENDOSCOPY;  Service: Gastroenterology;;  . CARDIAC CATHETERIZATION  06/2006   Angiographically normal cors  . CARDIAC CATHETERIZATION  12/22/2011   R/LHC: normal cors, well-compensated HDs, LV dysfxn  . CARDIOVERSION N/A 10/17/2016   Procedure: CARDIOVERSION;  Surgeon: Dolores Patty, MD;  Location: [facility] ENDOSCOPY;  Service: Cardiovascular;  Laterality: N/A;  . CARDIOVERSION N/A 03/20/2017   Procedure: CARDIOVERSION;  Surgeon: Dolores Patty, MD;  Location: [facility] ENDOSCOPY;  Service: Cardiovascular;  Laterality: N/A;  . COLECTOMY N/A 12/28/2018   Procedure: TOTAL COLECTOMY;  Surgeon: Emelia Loron, MD;  Location: [facility] OR;  Service: General;  Laterality: N/A;  . FLEXIBLE SIGMOIDOSCOPY N/A 12/23/2018   Procedure: FLEXIBLE SIGMOIDOSCOPY;  Surgeon: Lemar Lofty., MD;  Location: [facility] ENDOSCOPY;  Service: Gastroenterology;  Laterality: N/A;  . FLEXIBLE SIGMOIDOSCOPY N/A 01/23/2020   Procedure: FLEXIBLE SIGMOIDOSCOPY;  Surgeon: Lemar Lofty., MD;  Location: [facility] ENDOSCOPY;  Service: Gastroenterology;  Laterality: N/A;  . ICD IMPLANT N/A 09/03/2018   Procedure: ICD Medtronic IMPLANT;  Surgeon: Regan Lemming, MD;  Location: [facility] INV[facility] CV LAB;  Service: Cardiovascular;  Laterality: N/A;  . ILEOSTOMY N/A 12/28/2018   Procedure: ILEOSTOMY;  Surgeon: Emelia LoronWakefield, Matthew, MD;  [facility];  Service: General;  Laterality: N/A;  . IR RADIOLOGIST EVAL & MGMT  02/08/2019  . JOINT REPLACEMENT     right knee  . LEFT AND RIGHT HEART CATHETERIZATION WITH CORONARY ANGIOGRAM N/A 12/22/2011   Procedure: LEFT AND RIGHT HEART CATHETERIZATION WITH CORONARY ANGIOGRAM;  Surgeon: Dolores Pattyaniel R Bensimhon, MD;  [facility];  Service: Cardiovascular;  Laterality: N/A;  . PATELLAR TENDON REPAIR     Left  . RIGHT HEART CATH N/A 10/20/2016   Procedure: RIGHT HEART CATH;   Surgeon: Dolores PattyBensimhon, Daniel R, MD;  [facility];  Service: Cardiovascular;  Laterality: N/A;  . RIGHT HEART CATH N/A 04/17/2020   Procedure: RIGHT HEART CATH;  Surgeon: Dolores PattyBensimhon, Daniel R, MD;  Location: [facility] LAB;  Service: Cardiovascular;  Laterality: N/A;  . SKIN SPLIT GRAFT N/A 09/05/2019   Procedure: SKIN GRAFT SPLIT THICKNESS;  Surgeon: Allena NapoleonPace, Collier S, MD;  Location: [facility] OR;  Service: Plastics;  Laterality: N/A;  . TEE WITHOUT CARDIOVERSION N/A 03/20/2017   Procedure: TRANSESOPHAGEAL ECHOCARDIOGRAM (TEE);  Surgeon: Dolores PattyBensimhon, Daniel R, MD;  [facility];  Service: Cardiovascular;  Laterality: N/A;       Family History  Problem Relation Age of Onset  . Hypertension Mother   . Clotting disorder Mother        mom with PEs  . Cancer Mother        uterine cancer  . Diabetes Father   . Heart attack Father   . Hypertension Other        Sibling  . Diabetes Other   . Obesity Other   . Diabetes Sister   . Obesity Sister   . Obesity Sister   . Colon cancer Neg Hx   . Stomach cancer Neg Hx   . Esophageal cancer Neg Hx   . Rectal cancer Neg Hx   . Inflammatory bowel disease Neg Hx   . Liver disease Neg Hx   . Pancreatic cancer Neg Hx     Social History   Tobacco Use  . Smoking status: Never Smoker  . Smokeless tobacco: Never Used  Vaping Use  . Vaping Use: Never used  Substance Use Topics  . Alcohol use: Not Currently    Comment: Rarely  . Drug use: No    Home Medications Prior to Admission medications   Medication Sig Start Date End Date Taking? Authorizing Provider  amiodarone (PACERONE) 200 MG tablet Take 1 tablet (200 mg total) by mouth daily. 05/30/20  Yes Bensimhon, Bevelyn Bucklesaniel R, MD  carvedilol (COREG) 3.125 MG tablet TAKE 1 TABLET(3.125 MG) BY MOUTH TWICE DAILY WITH A MEAL Patient taking differently: Take 3.125 mg by mouth 2 (two) times daily with a meal. 02/06/20  Yes Bensimhon, Bevelyn Bucklesaniel R, MD  dapagliflozin propanediol (FARXIGA) 10 MG  TABS tablet Take 1 tablet (10 mg total) by mouth daily. 05/29/20  Yes Laurey MoraleMcLean, Dalton S, MD  digoxin (LANOXIN) 0.125 MG tablet Take 1 tablet (0.125 mg total) by mouth daily. 10/19/19  Yes Bensimhon, Bevelyn Bucklesaniel R, MD  ELIQUIS 5 MG TABS tablet TAKE 1 TABLET(5 MG) BY MOUTH TWICE DAILY Patient taking differently: Take 5 mg by mouth 2 (two) times daily. 04/27/20  Yes Sharol HarnessSimmons, Brittainy M, PA-C  potassium chloride SA (KLOR-CON) 20 MEQ tablet Take 2 tablets (40 mEq total) by mouth daily. 05/24/20  Yes Melene Plan, DO  spironolactone (ALDACTONE) 25 MG tablet Take 1/2 tablet (12.5 mg total) by mouth daily. Patient taking differently: Take 25 mg by mouth daily. 04/19/20  Yes Rai, Ripudeep K, MD  torsemide (DEMADEX) 20 MG tablet Take 2 tablets (40 mg total) by mouth 2 (two) times daily. Patient taking differently: Take 60 mg by mouth 2 (two) times daily. 05/24/20  Yes Melene Plan, DO    Allergies    Shrimp [shellfish allergy]  Review of Systems   Review of Systems All other systems are reviewed and are negative for acute change except as noted in the HPI.  Physical Exam Updated Vital Signs BP 111/72 (BP Location: Right Arm)   Pulse 72   Temp 98.6 F (37 C) (Oral)   Resp 20   Ht 5\' 8"  (1.727 m)   Wt (!) 162.5 kg   SpO2 95%   BMI 54.47 kg/m   Physical Exam Vitals and nursing note reviewed.  Constitutional:      General: He is not in acute distress.    Appearance: He is obese. He is not ill-appearing.     Comments: No tenderness to palpation of skull. No deformities or crepitus noted. No open wounds, abrasions or lacerations.  HENT:     Head: Normocephalic and atraumatic.     Right Ear: Tympanic membrane and external ear normal.     Left Ear: Tympanic membrane and external ear normal.     Nose: Nose normal.     Mouth/Throat:     Mouth: Mucous membranes are moist.     Pharynx: Oropharynx is clear.  Eyes:     General: No scleral icterus.       Right eye: No discharge.        Left eye: No discharge.      Extraocular Movements: Extraocular movements intact.     Conjunctiva/sclera: Conjunctivae normal.     Pupils: Pupils are equal, round, and reactive to light.  Neck:     Vascular: No JVD.  Cardiovascular:     Rate and Rhythm: Normal rate and regular rhythm.     Pulses: Normal pulses.          Radial pulses are 2+ on the right side and 2+ on the left side.     Heart sounds: Normal heart sounds. No murmur heard.   Pulmonary:     Comments: Lungs clear to auscultation in all fields. Symmetric chest rise. No wheezing, rales, or rhonchi. Abdominal:     General: A surgical scar is present.     Comments: Abdomen is soft, non-distended, and non-tender in all quadrants. No rigidity, no guarding. No peritoneal signs.   Ostomy in RLQ without signs of surrounding infection.  Musculoskeletal:        General: Normal range of motion.     Cervical back: Normal range of motion.     Comments: Moving all extremities without  Skin:    General: Skin is warm and dry.     Capillary Refill: Capillary refill takes less than 2 seconds.  Neurological:     Mental Status: He is oriented to person, place, and time.     GCS: GCS eye subscore is 4. GCS verbal subscore is 5. GCS motor subscore is 6.     Comments: Fluent speech, no facial droop.  Psychiatric:        Behavior: Behavior normal.     ED Results / Procedures / Treatments   Labs (all labs ordered are listed,  but only abnormal results are displayed) Labs Reviewed  CBC WITH DIFFERENTIAL/PLATELET - Abnormal; Notable for the following components:      Result Value   Abs Immature Granulocytes 0.11 (*)    All other components within normal limits  BASIC METABOLIC PANEL - Abnormal; Notable for the following components:   Potassium 3.4 (*)    Glucose, Bld 143 (*)    BUN 21 (*)    Creatinine, Ser 2.11 (*)    Calcium 8.6 (*)    GFR, Estimated 37 (*)    All other components within normal limits  BRAIN NATRIURETIC PEPTIDE - Abnormal; Notable for  the following components:   B Natriuretic Peptide 773.7 (*)    All other components within normal limits  URINALYSIS, ROUTINE W REFLEX MICROSCOPIC - Abnormal; Notable for the following components:   Color, Urine STRAW (*)    Specific Gravity, Urine 1.003 (*)    Glucose, UA >=500 (*)    Hgb urine dipstick SMALL (*)    Protein, ur 100 (*)    All other components within normal limits  DIGOXIN LEVEL - Abnormal; Notable for the following components:   Digoxin Level 0.2 (*)    All other components within normal limits  TROPONIN I (HIGH SENSITIVITY) - Abnormal; Notable for the following components:   Troponin I (High Sensitivity) 35 (*)    All other components within normal limits  RESP PANEL BY RT-PCR (FLU A&B, COVID) ARPGX2  MAGNESIUM  RAPID URINE DRUG SCREEN, HOSP PERFORMED  HIV ANTIBODY (ROUTINE TESTING W REFLEX)  TROPONIN I (HIGH SENSITIVITY)    EKG EKG Interpretation  Date/Time:  Friday Jun 01 2020 09:01:42 EDT Ventricular Rate:  71 PR Interval:  178 QRS Duration: 138 QT Interval:  449 QTC Calculation: 488 R Axis:   -65 Text Interpretation: Sinus rhythm Multiform ventricular premature complexes Probable left atrial enlargement Nonspecific IVCD with LAD Consider anterior infarct Nonspecific T abnormalities, lateral leads Confirmed by Tilden Fossa [phone]) on 06/01/2020 9:04:30 AM   Radiology CT Head Wo Contrast  Result Date: 06/01/2020 CLINICAL DATA:  Syncope, fall, on anticoagulation EXAM: CT HEAD WITHOUT CONTRAST TECHNIQUE: Contiguous axial images were obtained from the base of the skull through the vertex without intravenous contrast. COMPARISON:  12/16/2019 FINDINGS: Brain: No acute intracranial abnormality. Specifically, no hemorrhage, hydrocephalus, mass lesion, acute infarction, or significant intracranial injury. Vascular: No hyperdense vessel or unexpected calcification. Skull: No acute calvarial abnormality. Sinuses/Orbits: No acute findings Other: None IMPRESSION:  Negative. Electronically Signed   By: Charlett Nose M.D.   On: 06/01/2020 10:20   DG Chest Portable 1 View  Result Date: 06/01/2020 CLINICAL DATA:  55 year old male with shortness of breath and syncope. EXAM: PORTABLE CHEST 1 VIEW COMPARISON:  Portable chest 05/24/2020 and earlier. FINDINGS: Portable AP semi upright view at and 0924 hours. Stable cardiomegaly and mediastinal contours. Stable left chest AICD. Decreased pulmonary vascularity. Allowing for portable technique the lungs are clear. No pneumothorax or pleural effusion. Visualized tracheal air column is within normal limits. No acute osseous abnormality identified. IMPRESSION: Pulmonary edema appears resolved since 05/24/2020. Cardiomegaly with no acute cardiopulmonary abnormality. Electronically Signed   By: Odessa Fleming M.D.   On: 06/01/2020 09:41    Procedures Procedures   Medications Ordered in ED Medications  amiodarone (NEXTERONE) 1.8 mg/mL load via infusion 150 mg (150 mg Intravenous Bolus from Bag 06/01/20 1014)    Followed by  amiodarone (NEXTERONE PREMIX) 360-4.14 MG/200ML-% (1.8 mg/mL) IV infusion (60 mg/hr Intravenous New Bag/Given 06/01/20 1014)  Followed by  amiodarone (NEXTERONE PREMIX) 360-4.14 MG/200ML-% (1.8 mg/mL) IV infusion (has no administration in time range)  magnesium sulfate IVPB 4 g 100 mL (4 g Intravenous New Bag/Given 06/01/20 1122)  sodium chloride flush (NS) 0.9 % injection 3 mL (has no administration in time range)  sodium chloride flush (NS) 0.9 % injection 3 mL (has no administration in time range)  0.9 %  sodium chloride infusion (has no administration in time range)  acetaminophen (TYLENOL) tablet 650 mg (has no administration in time range)  ondansetron (ZOFRAN) injection 4 mg (has no administration in time range)  potassium chloride SA (KLOR-CON) CR tablet 40 mEq (40 mEq Oral Given 06/01/20 1043)    ED Course  I have reviewed the triage vital signs and the nursing notes.  Pertinent labs & imaging  results that were available during my care of the patient were reviewed by me and considered in my medical decision making (see chart for details).    MDM Rules/Calculators/A&P                          History provided by patient and EMS with additional history obtained from chart review.    Patient presenting after syncopal episode.  On ED arrival he is asymptomatic.  He is afebrile, hemodynamically stable.  He has a normal cardiac exam, lungs are clear to auscultation all fields.  He does have left lower extremity edema and thickened skin on left ankle which he admits to being at baseline 2/2 DVT. He has no calf tenderness. He had a syncopal episode and was incontinent of urine.  This was witnessed by coworkers and EMS denied any seizure-like activity per bystanders.  Chart review shows patient had similar episode in December 2021 when he had V. tach.  Suspect this could be the same therefore asked for defibrillator to be interrogated after my initial exam.  Consulted cardiology as well and the midlevel has ordered loading dose and infusion of amiodarone. EKG without STEMI. CBC unremarkable.  BMP shows elevated kidney function.  Creatinine today is 2.11, compared to x8 days ago when it was 1.7, GFR is 37.  There is mild hypokalemia of 3.4, no other significant electrolyte derangement.  First troponin is 35. BNP is 773, slightly up from x8 days ago. UA is negative for signs of infection. UDS is negative. Digoxin level also low at 0.2. Chest x-ray shows cardiomegaly without acute infectious processes.  No obvious volume overload.  CT head obtained given fall and being on a blood thinner.  Imaging shows no acute traumatic injuries. I viewed images and agree with radiologist impressions. This case was discussed with ED supervising physician Dr. Madilyn Hook who has seen the patient and agrees with plan to admit. Defibrillator interrogation shows run of V. Tach and shock delivered.  Cardiology to  admit.    Portions of this note were generated with Scientist, clinical (histocompatibility and immunogenetics). Dictation errors may occur despite best attempts at proofreading.   Final Clinical Impression(s) / ED Diagnoses Final diagnoses:  Ventricular tachyarrhythmia (HCC)  Syncope and collapse    Rx / DC Orders ED Discharge Orders    None       Kandice Hams 06/01/20 1205    Tilden Fossa, MD 06/03/20 1218

## 2020-06-01 NOTE — ED Triage Notes (Signed)
Pt bib [facility] from [facility] with complaints of walking to the bathroom and getting shob then passing out for about 2-3 min. Doesn't know if he hit his head, and no head trauma noted. Pt does have a small scrape to the R elbow. Pt has medtronic ICD and denies any shocks today  EMS vitals:  112/88 95% 2L 154 CBG

## 2020-06-01 NOTE — Consult Note (Addendum)
Advanced Heart Failure Team Consult Note   Primary Physician: [name], MD PCP-Cardiologist:  Arvilla Meres, MD  Reason for Consultation: Heart Failure   HPI:    Roberto [name] is seen today for evaluation of heart failure at the request of EP.    Roberto [name] is a 55 y.o. male  with h/o systolic HF due to NICM, h/o recurrent DVT with resultant PE, PAF, chronic venous insufficiency, h/o LV thrombus (on Coumadin), PSVT and morbid obesity. Has ileostomy.   Had AFL ablation 11/2016 .    Back in 12/19/18- 01/24/19 for acute diverticulits. Failed IV abx. Underwent R colectomy with ileostomy.  In OR patient became hypotensive, required pressors. Course c/b perforated R colon with right lateral peritoneal fluid collection s/p right lateral abdominal drain placement in IR 01/04/2019. Post-op struggling with non-healing ab wound and underwent skin grafting on 09/05/19.  Had VT/shock 12/2019 --> Hypokalemia . He had taken metolazone. Echo showed EF remained 25-30% with DD.   On 05/24/20 he was seen in the ED for increased shortness of breath. Instructed to increase torsemide to 60 mg twice a day. K 3.7. Home weight was 355 pounds but today down to 349 pounds.   Today he went to work and says he was short of breath walking in the building and had to sit down. He then got up to go to the bathroom and collapsed. Unresponsive for few minutes. EMS call. Device interrogation showed  VT --had ATP--> VF with 1 appropriate shock. K and Mag low. Started on amio drip and replacing K and Mag.    Optivol: Impedance up, fluid index flat, activity ~1 hour.  CT of head negative. BNP 773, SARS2 negative.   Review of Systems: [y] = yes, [ ]  = no   . General: Weight gain [ ] ; Weight loss [Y ]; Anorexia [ ] ; Fatigue [Y ]; Fever [ ] ; Chills [ ] ; Weakness [ Y]  . Cardiac: Chest pain/pressure [ ] ; Resting SOB [ ] ; Exertional SOB [ Y]; Orthopnea [ ] ; Pedal Edema [ ] ; Palpitations [ ] ; Syncope [ ] ;  Presyncope [ ] ; Paroxysmal nocturnal dyspnea[ ]   . Pulmonary: Cough [ ] ; Wheezing[ ] ; Hemoptysis[ ] ; Sputum [ ] ; Snoring [ ]   . GI: Vomiting[ ] ; Dysphagia[ ] ; Melena[ ] ; Hematochezia [ ] ; Heartburn[ ] ; Abdominal pain [ ] ; Constipation [ ] ; Diarrhea [ ] ; BRBPR [ ]   . GU: Hematuria[ ] ; Dysuria [ ] ; Nocturia[ ]   . Vascular: Pain in legs with walking [ ] ; Pain in feet with lying flat [ ] ; Non-healing sores [ ] ; Stroke [ ] ; TIA [ ] ; Slurred speech [ ] ;  . Neuro: Headaches[ ] ; Vertigo[ ] ; Seizures[ ] ; Paresthesias[ ] ;Blurred vision [ ] ; Diplopia [ ] ; Vision changes [ ]   . Ortho/Skin: Arthritis [ ] ; Joint pain [Y ]; Muscle pain [ ] ; Joint swelling [ ] ; Back Pain [Y ]; Rash [ ]   . Psych: Depression[ ] ; Anxiety[ ]   . Heme: Bleeding problems [ ] ; Clotting disorders [ ] ; Anemia [ ]   . Endocrine: Diabetes [Y ]; Thyroid dysfunction[ ]   Home Medications Prior to Admission medications   Medication Sig Start Date End Date Taking? Authorizing Provider  amiodarone (PACERONE) 200 MG tablet Take 1 tablet (200 mg total) by mouth daily. 05/30/20  Yes Jaleea Alesi, , MD  carvedilol (COREG) 3.125 MG tablet TAKE 1 TABLET(3.125 MG) BY MOUTH TWICE DAILY WITH A MEAL Patient taking differently: Take 3.125 mg by mouth 2 (two) times  daily with a meal. 02/06/20  Yes Evelin Cake, Bevelyn Buckles, MD  dapagliflozin propanediol (FARXIGA) 10 MG TABS tablet Take 1 tablet (10 mg total) by mouth daily. 05/29/20  Yes Laurey Morale, MD  digoxin (LANOXIN) 0.125 MG tablet Take 1 tablet (0.125 mg total) by mouth daily. 10/19/19  Yes Chenika Nevils, Bevelyn Buckles, MD  ELIQUIS 5 MG TABS tablet TAKE 1 TABLET(5 MG) BY MOUTH TWICE DAILY Patient taking differently: Take 5 mg by mouth 2 (two) times daily. 04/27/20  Yes Sharol Harness, Brittainy M, PA-C  potassium chloride SA (KLOR-CON) 20 MEQ tablet Take 2 tablets (40 mEq total) by mouth daily. 05/24/20  Yes Melene Plan, DO  spironolactone (ALDACTONE) 25 MG tablet Take 1/2 tablet (12.5 mg total) by mouth  daily. Patient taking differently: Take 25 mg by mouth daily. 04/19/20  Yes Rai, Ripudeep K, MD  torsemide (DEMADEX) 20 MG tablet Take 2 tablets (40 mg total) by mouth 2 (two) times daily. Patient taking differently: Take 60 mg by mouth 2 (two) times daily. 05/24/20  Yes Melene Plan, DO    Past Medical History: Past Medical History:  Diagnosis Date  . AICD (automatic cardioverter/defibrillator) present 08/2018   Medtronic ICD  . Arthritis    knee  . Chronic combined systolic and diastolic CHF (congestive heart failure) (HCC)   . Coronary artery disease   . Deep vein thrombosis (HCC) 2007   left leg  . Diverticulitis   . Hepatitis    patient denies this dx  . Lupus anticoagulant positive   . Morbid obesity (HCC)   . Morbid obesity with BMI of 50.0-59.9, adult (HCC)   . Mural thrombus of heart    coumadin  . Nonischemic cardiomyopathy (HCC)    a) 12/17/11 echo: LVEF 25-30%, grade 3 diastolic dysfunction (c/w restriction), mod MR, mod LA/LV and mild RA dilatation; b) 12/18/11 cMRI: LVEF 38%, mod LV/mild RV dilatation, global HK, mild-mod RV sys dysfxn, no LV thrombus & patchy non-subendocardial delayed enhancement c/w infil dz or prior myocarditis; c. 07/2018 EF 25-30%.  . Paroxysmal SVT (supraventricular tachycardia) (HCC)   . Pneumonia    x 2  . Pulmonary embolism (HCC)    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  . Wears glasses   . Wound of abdomen     Past Surgical History: Past Surgical History:  Procedure Laterality Date  . A-FLUTTER ABLATION N/A 11/17/2016   Procedure: A-FLUTTER ABLATION;  Surgeon: Regan Lemming, MD;  [facility];  Service: Cardiovascular;  Laterality: N/A;  . APPLICATION OF WOUND VAC N/A 09/05/2019   Procedure: APPLICATION OF WOUND VAC;  Surgeon: Allena Napoleon, MD;  Location: [facility] OR;  Service: Plastics;  Laterality: N/A;  PATIENT'S HOME WOUND VAC APPLIED.  [name] BIOPSY  12/23/2018   Procedure: BIOPSY;  Surgeon: Meridee Score Netty Starring., MD;   Location: [facility] ENDOSCOPY;  Service: Gastroenterology;;  . BIOPSY  01/23/2020   Procedure: BIOPSY;  Surgeon: Lemar Lofty., MD;  Location: [facility] ENDOSCOPY;  Service: Gastroenterology;;  . CARDIAC CATHETERIZATION  06/2006   Angiographically normal cors  . CARDIAC CATHETERIZATION  12/22/2011   R/LHC: normal cors, well-compensated HDs, LV dysfxn  . CARDIOVERSION N/A 10/17/2016   Procedure: CARDIOVERSION;  Surgeon: Dolores Patty, MD;  Location: [facility] ENDOSCOPY;  Service: Cardiovascular;  Laterality: N/A;  . CARDIOVERSION N/A 03/20/2017   Procedure: CARDIOVERSION;  Surgeon: Dolores Patty, MD;  Location: [facility] ENDOSCOPY;  Service: Cardiovascular;  Laterality: N/A;  . COLECTOMY N/A 12/28/2018   Procedure: TOTAL  COLECTOMY;  Surgeon: Emelia LoronWakefield, Matthew, MD;  [facility];  Service: General;  Laterality: N/A;  . FLEXIBLE SIGMOIDOSCOPY N/A 12/23/2018   Procedure: Arnell SievingFLEXIBLE SIGMOIDOSCOPY;  Surgeon: Lemar LoftyMansouraty, Gabriel Jr., MD;  [facility];  Service: Gastroenterology;  Laterality: N/A;  . FLEXIBLE SIGMOIDOSCOPY N/A 01/23/2020   Procedure: FLEXIBLE SIGMOIDOSCOPY;  Surgeon: Lemar LoftyMansouraty, Gabriel Jr., MD;  [facility];  Service: Gastroenterology;  Laterality: N/A;  . ICD IMPLANT N/A 09/03/2018   Procedure: ICD Medtronic IMPLANT;  Surgeon: Regan Lemmingamnitz, Will Martin, MD;  [facility];  Service: Cardiovascular;  Laterality: N/A;  . ILEOSTOMY N/A 12/28/2018   Procedure: ILEOSTOMY;  Surgeon: Emelia LoronWakefield, Matthew, MD;  [facility];  Service: General;  Laterality: N/A;  . IR RADIOLOGIST EVAL & MGMT  02/08/2019  . JOINT REPLACEMENT     right knee  . LEFT AND RIGHT HEART CATHETERIZATION WITH CORONARY ANGIOGRAM N/A 12/22/2011   Procedure: LEFT AND RIGHT HEART CATHETERIZATION WITH CORONARY ANGIOGRAM;  Surgeon: Dolores Pattyaniel R Nakiesha Rumsey, MD;  [facility];  Service: Cardiovascular;  Laterality: N/A;  . PATELLAR TENDON REPAIR     Left  . RIGHT HEART CATH N/A 10/20/2016    Procedure: RIGHT HEART CATH;  Surgeon: Dolores PattyBensimhon, Johnie Makki R, MD;  [facility];  Service: Cardiovascular;  Laterality: N/A;  . RIGHT HEART CATH N/A 04/17/2020   Procedure: RIGHT HEART CATH;  Surgeon: Dolores PattyBensimhon, Kashmere Staffa R, MD;  [facility];  Service: Cardiovascular;  Laterality: N/A;  . SKIN SPLIT GRAFT N/A 09/05/2019   Procedure: SKIN GRAFT SPLIT THICKNESS;  Surgeon: Allena NapoleonPace, Collier S, MD;  Location: [facility];  Service: Plastics;  Laterality: N/A;  . TEE WITHOUT CARDIOVERSION N/A 03/20/2017   Procedure: TRANSESOPHAGEAL ECHOCARDIOGRAM (TEE);  Surgeon: Dolores PattyBensimhon, Devone Bonilla R, MD;  [facility];  Service: Cardiovascular;  Laterality: N/A;    Family History: Family History  Problem Relation Age of Onset  . Hypertension Mother   . Clotting disorder Mother        mom with PEs  . Cancer Mother        uterine cancer  . Diabetes Father   . Heart attack Father   . Hypertension Other        Sibling  . Diabetes Other   . Obesity Other   . Diabetes Sister   . Obesity Sister   . Obesity Sister   . Colon cancer Neg Hx   . Stomach cancer Neg Hx   . Esophageal cancer Neg Hx   . Rectal cancer Neg Hx   . Inflammatory bowel disease Neg Hx   . Liver disease Neg Hx   . Pancreatic cancer Neg Hx     Social History: Social History   Socioeconomic History  . Marital status: Divorced    Spouse name: Not on file  . Number of children: 5  . Years of education: Not on file  . Highest education level: Not on file  Occupational History  . Occupation: Oceanographerinancial aid advisor for [employer]    Employer: [employer]  Tobacco Use  . Smoking status: Never Smoker  . Smokeless tobacco: Never Used  Vaping Use  . Vaping Use: Never used  Substance and Sexual Activity  . Alcohol use: Not Currently    Comment: Rarely  . Drug use: No  . Sexual activity: Not on file  Other Topics Concern  . Not on file  Social History Narrative   [place]  Separated 03/2015, 5 children.   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  Exercises with weights and some walking. 12/2015   Social Determinants of Health   Financial Resource Strain: Low Risk   . Difficulty of Paying Living Expenses: Not very hard  Food Insecurity: No Food Insecurity  . Worried About Programme researcher, broadcasting/film/video in the Last Year: Never true  . Ran Out of Food in the Last Year: Never true  Transportation Needs: No Transportation Needs  . Lack of Transportation (Medical): No  . Lack of Transportation (Non-Medical): No  Physical Activity: Not on file  Stress: Not on file  Social Connections: Not on file    Allergies:  Allergies  Allergen Reactions  . Shrimp [Shellfish Allergy] Hives and Itching    Objective:    Vital Signs:   Temp:  [98.6 F (37 C)] 98.6 F (37 C) (05/20 0900) Pulse Rate:  [69-72] 69 (05/20 0945) Resp:  [17-25] 17 (05/20 0945) BP: (92-111)/(62-72) 92/65 (05/20 0945) SpO2:  [93 %-95 %] 93 % (05/20 0945) Weight:  [162.5 kg] 162.5 kg (05/20 0856)    Weight change: Filed Weights   06/01/20 0856  Weight: (!) 162.5 kg    Intake/Output:  No intake or output data in the 24 hours ending 06/01/20 1052    Physical Exam    General:   No resp difficulty HEENT: normal Neck: supple. JVP difficult to assess due to body habitus. Carotids 2+ bilat; no bruits. No lymphadenopathy or thyromegaly appreciated. Cor: PMI nondisplaced. Regular rate & rhythm. No rubs, gallops or murmurs. Lungs: clear Abdomen: obese, soft, nontender, nondistended. No hepatosplenomegaly. No bruits or masses. Good bowel sounds. Extremities: no cyanosis, clubbing, rash, edema Neuro: alert & orientedx3, cranial nerves grossly intact. moves all 4 extremities w/o difficulty. Affect pleasant   Telemetry  SR 70s    EKG    SR 71 bpm   Labs   Basic Metabolic Panel: Recent Labs  Lab 06/01/20 0909  NA 136  K 3.4*  CL 103  CO2 24  GLUCOSE 143*  BUN  21*  CREATININE 2.11*  CALCIUM 8.6*  MG 1.7    Liver Function Tests: No results for input(s): AST, ALT, ALKPHOS, BILITOT, PROT, ALBUMIN in the last 168 hours. No results for input(s): LIPASE, AMYLASE in the last 168 hours. No results for input(s): AMMONIA in the last 168 hours.  CBC: Recent Labs  Lab 06/01/20 0909  WBC 9.5  NEUTROABS 6.6  HGB 16.2  HCT 49.3  MCV 92.5  PLT 160    Cardiac Enzymes: No results for input(s): CKTOTAL, CKMB, CKMBINDEX, TROPONINI in the last 168 hours.  BNP: BNP (last 3 results) Recent Labs    04/13/20 1126 05/24/20 0759 06/01/20 0909  BNP 917.0* 583.6* 773.7*    ProBNP (last 3 results) No results for input(s): PROBNP in the last 8760 hours.   CBG: No results for input(s): GLUCAP in the last 168 hours.  Coagulation Studies: No results for input(s): LABPROT, INR in the last 72 hours.   Imaging   CT Head Wo Contrast  Result Date: 06/01/2020 CLINICAL DATA:  Syncope, fall, on anticoagulation EXAM: CT HEAD WITHOUT CONTRAST TECHNIQUE: Contiguous axial images were obtained from the base of the skull through the vertex without intravenous contrast. COMPARISON:  12/16/2019 FINDINGS: Brain: No acute intracranial abnormality. Specifically, no hemorrhage, hydrocephalus, mass lesion, acute infarction, or significant intracranial injury. Vascular: No hyperdense vessel or unexpected calcification. Skull: No acute calvarial abnormality. Sinuses/Orbits: No acute  findings Other: None IMPRESSION: Negative. Electronically Signed   By: Charlett Nose M.D.   On: 06/01/2020 10:20   DG Chest Portable 1 View  Result Date: 06/01/2020 CLINICAL DATA:  55 year old male with shortness of breath and syncope. EXAM: PORTABLE CHEST 1 VIEW COMPARISON:  Portable chest 05/24/2020 and earlier. FINDINGS: Portable AP semi upright view at and 0924 hours. Stable cardiomegaly and mediastinal contours. Stable left chest AICD. Decreased pulmonary vascularity. Allowing for portable  technique the lungs are clear. No pneumothorax or pleural effusion. Visualized tracheal air column is within normal limits. No acute osseous abnormality identified. IMPRESSION: Pulmonary edema appears resolved since 05/24/2020. Cardiomegaly with no acute cardiopulmonary abnormality. Electronically Signed   By: Odessa Fleming M.D.   On: 06/01/2020 09:41      Medications:     Current Medications:   Infusions: . amiodarone 60 mg/hr (06/01/20 1014)   Followed by  . amiodarone    . magnesium sulfate bolus IVPB         Assessment/Plan   1. VF -Shock x1.  -K and Mag low. Supp K/Mag -Started on amio drip.   2. Chronic Combined Systolic/Diastolic  Heart Failure  Echo 40/98 EF 25-30% -NYHA III. Volume status appears ok.Optivol is not suggestive of fluid accumulation.  -Continue torsemide 60 mg daily  - Continue spiro 12.5 mg daily  - Follow renal function   3. PAF S/P A Flutter Ablation 2018 On amio drip as noted above  4. CKD  Stage IIIb -Creatinine baseline 1.8-2 -Creatinine today 2.1  -Follow BMET daily.   5. Obesity Body mass index is 54.47 kg/m.   6.  H/o Acute diverticulitis with colon resection and ileostomy - S/p skin grafting 09/05/19. - No plan for reversal of ileostomy after extensive abdominal hernia  Length of Stay: 0  Amy Clegg, NP  06/01/2020, 10:52 AM  Advanced Heart Failure Team Pager [phone] (M-F; 7a - 5p)  Please contact [facility] for night-coverage after hours (4p -7a ) and weekends on [email or web address]   Patient seen and examined with the above-signed Advanced Practice Provider and/or Housestaff. I personally reviewed laboratory data, imaging studies and relevant notes. I independently examined the patient and formulated the important aspects of the plan. I have edited the note to reflect any of my changes or salient points. I have personally discussed the plan with the patient and/or family.  55 y/o obese male with severe systolic HF due to NICM.  Recently struggling with NYHA IIIb-IV symptoms Received IV lasix last week in ER.   Today admitted with VT -> VF arrest. Received ICD shock. K 3.4 Mg 1.7  Now awake and alert. No evidence of significant volume overload  General:  Lying in bed . No resp difficulty HEENT: normal Neck: supple. no JVD. Carotids 2+ bilat; no bruits. No lymphadenopathy or thryomegaly appreciated. Cor: PMI nondisplaced. Regular rate & rhythm. No rubs, gallops or murmurs. Lungs: clear Abdomen: obese soft, nontender, nondistended. No hepatosplenomegaly. No bruits or masses. Good bowel sounds. + colostomy Extremities: no cyanosis, clubbing, rash, edema Neuro: alert & orientedx3, cranial nerves grossly intact. moves all 4 extremities w/o difficulty. Affect pleasant  He is now s/p ICD shock for VF arrest. Appreciate EP input. Continue IV amio.  Supp K and Mg.   Volume status looks ok. I worry his cardiomyopathy is nearing endstage. Not ideal candidate for advanced theraapies with size, CKD and ostomy.   Arvilla Meres, MD  4:55 PM

## 2020-06-01 NOTE — ED Notes (Signed)
Attempted report x 2 

## 2020-06-02 DIAGNOSIS — E876 Hypokalemia: Secondary | ICD-10-CM | POA: Diagnosis not present

## 2020-06-02 DIAGNOSIS — I472 Ventricular tachycardia: Principal | ICD-10-CM

## 2020-06-02 DIAGNOSIS — I5023 Acute on chronic systolic (congestive) heart failure: Secondary | ICD-10-CM | POA: Diagnosis not present

## 2020-06-02 LAB — BASIC METABOLIC PANEL
Anion gap: 9 (ref 5–15)
BUN: 18 mg/dL (ref 6–20)
CO2: 28 mmol/L (ref 22–32)
Calcium: 8.4 mg/dL — ABNORMAL LOW (ref 8.9–10.3)
Chloride: 102 mmol/L (ref 98–111)
Creatinine, Ser: 1.85 mg/dL — ABNORMAL HIGH (ref 0.61–1.24)
GFR, Estimated: 43 mL/min — ABNORMAL LOW (ref >60)
Glucose, Bld: 119 mg/dL — ABNORMAL HIGH (ref 70–99)
Potassium: 3.4 mmol/L — ABNORMAL LOW (ref 3.5–5.1)
Sodium: 139 mmol/L (ref 135–145)

## 2020-06-02 LAB — MAGNESIUM: Magnesium: 2.4 mg/dL (ref 1.7–2.4)

## 2020-06-02 MED ORDER — POTASSIUM CHLORIDE CRYS ER 20 MEQ PO TBCR
40.0000 meq | EXTENDED_RELEASE_TABLET | Freq: Two times a day (BID) | ORAL | Status: DC
  Administered 2020-06-02 – 2020-06-03 (×2): 40 meq via ORAL
  Filled 2020-06-02: qty 4
  Filled 2020-06-02 (×2): qty 2

## 2020-06-02 NOTE — Progress Notes (Signed)
Progress Note   Subjective   Doing well today, the patient denies CP or SOB.  No new concerns  Inpatient Medications    Scheduled Meds: . apixaban  5 mg Oral BID  . carvedilol  3.125 mg Oral BID WC  . dapagliflozin propanediol  10 mg Oral Daily  . digoxin  0.125 mg Oral Daily  . potassium chloride SA  40 mEq Oral Daily  . sodium chloride flush  3 mL Intravenous Q12H  . spironolactone  25 mg Oral Daily  . torsemide  60 mg Oral BID   Continuous Infusions: . sodium chloride    . amiodarone 30 mg/hr (06/02/20 0939)   PRN Meds: sodium chloride, acetaminophen, ondansetron (ZOFRAN) IV, sodium chloride flush   Vital Signs    Vitals:   06/01/20 2000 06/02/20 0035 06/02/20 0407 06/02/20 0807  BP: 113/71 121/83 118/78 106/66  Pulse: 68   65  Resp: 16 16 15 18   Temp: 98.1 F (36.7 C) 98.2 F (36.8 C) 97.9 F (36.6 C) 97.6 F (36.4 C)  TempSrc: Oral Oral Oral Oral  SpO2:    95%  Weight:   (!) 156.7 kg   Height:        Intake/Output Summary (Last 24 hours) at 06/02/2020 1104 Last data filed at 06/02/2020 0600 Gross per 24 hour  Intake 596.69 ml  Output 2880 ml  Net -2283.31 ml   Filed Weights   06/01/20 0856 06/02/20 0407  Weight: (!) 162.5 kg (!) 156.7 kg    Telemetry    sinus - Personally Reviewed  Physical Exam   GEN- The patient is well appearing, alert and oriented x 3 today.   Head- normocephalic, atraumatic Eyes-  Sclera clear, conjunctiva pink Ears- hearing intact Oropharynx- clear Neck- supple, Lungs-  normal work of breathing Heart- Regular rate and rhythm  GI- soft  Extremities- no clubbing, cyanosis, or edema  MS- no significant deformity or atrophy Skin- no rash or lesion Psych- euthymic mood, full affect Neuro- strength and sensation are intact   Labs    Chemistry Recent Labs  Lab 06/01/20 0909 06/02/20 0150  NA 136 139  K 3.4* 3.4*  CL 103 102  CO2 24 28  GLUCOSE 143* 119*  BUN 21* 18  CREATININE 2.11* 1.85*  CALCIUM 8.6*  8.4*  GFRNONAA 37* 43*  ANIONGAP 9 9     Hematology Recent Labs  Lab 06/01/20 0909  WBC 9.5  RBC 5.33  HGB 16.2  HCT 49.3  MCV 92.5  MCH 30.4  MCHC 32.9  RDW 13.7  PLT 160     Patient ID  Roberto [name] is a 55 y.o. male with a h/o systolic HF due to NICM, h/o recurrent DVT with resultant PE and chronic venous insufficiency, h/o LV thrombus (on Coumadin), VT and morbid obesity.   Assessment & Plan    1.  VT Occurred in the setting of low K and Mg (similar event 12/21).  K and Mg were not exceedingly low this time. Transition to oral amiodarone tomorrow  2. Acute on chronic systolic CHF/ nonischemic CM Reviewed CHF team note NYHA Class IIIB/ IV per Dr 1/22 Continue current therapy as per Dr Suella Grove Will need close outpatient K and Mg management  3. CKD, stage IIIB Stable No change required today  4. Morbid obesity Body mass index is 52.52 kg/m. Will substantially impact his overall health/ survivial Lifestyle modification is advised  5. Hypokalemia Increase KDur to Roberto [name] BID  MD,  [facility] 06/02/2020 11:04 AM

## 2020-06-03 DIAGNOSIS — I5023 Acute on chronic systolic (congestive) heart failure: Secondary | ICD-10-CM | POA: Diagnosis not present

## 2020-06-03 DIAGNOSIS — I472 Ventricular tachycardia: Secondary | ICD-10-CM | POA: Diagnosis not present

## 2020-06-03 LAB — BASIC METABOLIC PANEL
Anion gap: 10 (ref 5–15)
BUN: 20 mg/dL (ref 6–20)
CO2: 30 mmol/L (ref 22–32)
Calcium: 8.6 mg/dL — ABNORMAL LOW (ref 8.9–10.3)
Chloride: 99 mmol/L (ref 98–111)
Creatinine, Ser: 1.92 mg/dL — ABNORMAL HIGH (ref 0.61–1.24)
GFR, Estimated: 41 mL/min — ABNORMAL LOW (ref >60)
Glucose, Bld: 114 mg/dL — ABNORMAL HIGH (ref 70–99)
Potassium: 3.3 mmol/L — ABNORMAL LOW (ref 3.5–5.1)
Sodium: 139 mmol/L (ref 135–145)

## 2020-06-03 LAB — MAGNESIUM: Magnesium: 2 mg/dL (ref 1.7–2.4)

## 2020-06-03 MED ORDER — POTASSIUM CHLORIDE CRYS ER 20 MEQ PO TBCR
60.0000 meq | EXTENDED_RELEASE_TABLET | Freq: Two times a day (BID) | ORAL | Status: DC
  Administered 2020-06-03: 60 meq via ORAL
  Filled 2020-06-03: qty 3

## 2020-06-03 MED ORDER — POTASSIUM CHLORIDE 10 MEQ/100ML IV SOLN
10.0000 meq | INTRAVENOUS | Status: AC
  Administered 2020-06-03 (×4): 10 meq via INTRAVENOUS
  Filled 2020-06-03 (×4): qty 100

## 2020-06-03 MED ORDER — DICLOFENAC SODIUM 1 % EX GEL
2.0000 g | CUTANEOUS | Status: DC | PRN
  Administered 2020-06-03 – 2020-06-07 (×4): 2 g via TOPICAL
  Filled 2020-06-03: qty 100

## 2020-06-03 NOTE — Progress Notes (Signed)
   Progress Note   Subjective   Doing well today, the patient denies CP or SOB.  No new concerns  Inpatient Medications    Scheduled Meds: . apixaban  5 mg Oral BID  . carvedilol  3.125 mg Oral BID WC  . dapagliflozin propanediol  10 mg Oral Daily  . digoxin  0.125 mg Oral Daily  . potassium chloride SA  40 mEq Oral BID  . sodium chloride flush  3 mL Intravenous Q12H  . spironolactone  25 mg Oral Daily  . torsemide  60 mg Oral BID   Continuous Infusions: . sodium chloride    . amiodarone 30 mg/hr (06/02/20 2202)   PRN Meds: sodium chloride, acetaminophen, ondansetron (ZOFRAN) IV, sodium chloride flush   Vital Signs    Vitals:   06/02/20 1956 06/03/20 0423 06/03/20 0840 06/03/20 0848  BP: 106/68 108/72 109/73   Pulse: 70 65 66 66  Resp: 20  18   Temp: (!) 97.5 F (36.4 C) 98.2 F (36.8 C) 97.9 F (36.6 C)   TempSrc: Axillary Oral Oral   SpO2:  94% 94%   Weight:  (!) 154.4 kg    Height:        Intake/Output Summary (Last 24 hours) at 06/03/2020 0932 Last data filed at 06/02/2020 2100 Gross per 24 hour  Intake 454.25 ml  Output 2800 ml  Net -2345.75 ml   Filed Weights   06/01/20 0856 06/02/20 0407 06/03/20 0423  Weight: (!) 162.5 kg (!) 156.7 kg (!) 154.4 kg    Telemetry    sinus - Personally Reviewed  Physical Exam   GEN- The patient is morbidly obese appearing, alert and oriented x 3 today.   Head- normocephalic, atraumatic Eyes-  Sclera clear, conjunctiva pink Ears- hearing intact Oropharynx- clear Neck- supple, Lungs-  normal work of breathing Heart- Regular rate and rhythm  GI- soft  Extremities- no clubbing, cyanosis, or edema  MS- no significant deformity or atrophy Skin- no rash or lesion Psych- euthymic mood, full affect Neuro- strength and sensation are intact   Labs    Chemistry Recent Labs  Lab 06/01/20 0909 06/02/20 0150 06/03/20 0325  NA 136 139 139  K 3.4* 3.4* 3.3*  CL 103 102 99  CO2 24 28 30   GLUCOSE 143* 119* 114*   BUN 21* 18 20  CREATININE 2.11* 1.85* 1.92*  CALCIUM 8.6* 8.4* 8.6*  GFRNONAA 37* 43* 41*  ANIONGAP 9 9 10      Hematology Recent Labs  Lab 06/01/20 0909  WBC 9.5  RBC 5.33  HGB 16.2  HCT 49.3  MCV 92.5  MCH 30.4  MCHC 32.9  RDW 13.7  PLT 160     Patient ID  Danzyis a 54 y.o.malewith a h/o systolic HF due to NICM, h/o recurrent DVT with resultant PE and chronic venous insufficiency, h/o LV thrombus (on eliquis), VT and morbid obesity.  Assessment & Plan    1.  VT Per Dr 06/03/20 plan, will continue IV amiodarone today and convert to PO tomorrow  2. Acute on chronic systolic CHF/ nonischemic CM NYHA Class IIIB/IV per Dr Vickey Huger continue current therapy Will need more aggressive K replacement  3. Stage IIIb CKD Stable No change required today  4. Hypokalemia Will give additional IV K today Increase oral dose as well  5. Morbid obesity Body mass index is 51.74 kg/m. Lifestyle modification advised  Lubertha Basque MD, [facility] 06/03/2020 9:32 AM

## 2020-06-04 DIAGNOSIS — I472 Ventricular tachycardia: Secondary | ICD-10-CM | POA: Diagnosis not present

## 2020-06-04 DIAGNOSIS — I5022 Chronic systolic (congestive) heart failure: Secondary | ICD-10-CM | POA: Diagnosis not present

## 2020-06-04 LAB — BASIC METABOLIC PANEL
Anion gap: 11 (ref 5–15)
Anion gap: 9 (ref 5–15)
BUN: 20 mg/dL (ref 6–20)
BUN: 20 mg/dL (ref 6–20)
CO2: 26 mmol/L (ref 22–32)
CO2: 27 mmol/L (ref 22–32)
Calcium: 8.7 mg/dL — ABNORMAL LOW (ref 8.9–10.3)
Calcium: 8.6 mg/dL — ABNORMAL LOW (ref 8.9–10.3)
Chloride: 100 mmol/L (ref 98–111)
Chloride: 101 mmol/L (ref 98–111)
Creatinine, Ser: 2.1 mg/dL — ABNORMAL HIGH (ref 0.61–1.24)
Creatinine, Ser: 1.91 mg/dL — ABNORMAL HIGH (ref 0.61–1.24)
GFR, Estimated: 37 mL/min — ABNORMAL LOW (ref >60)
GFR, Estimated: 41 mL/min — ABNORMAL LOW (ref >60)
Glucose, Bld: 132 mg/dL — ABNORMAL HIGH (ref 70–99)
Glucose, Bld: 135 mg/dL — ABNORMAL HIGH (ref 70–99)
Potassium: 3.8 mmol/L (ref 3.5–5.1)
Potassium: 4 mmol/L (ref 3.5–5.1)
Sodium: 137 mmol/L (ref 135–145)
Sodium: 137 mmol/L (ref 135–145)

## 2020-06-04 LAB — MAGNESIUM: Magnesium: 2 mg/dL (ref 1.7–2.4)

## 2020-06-04 MED ORDER — POTASSIUM CHLORIDE 10 MEQ/100ML IV SOLN
10.0000 meq | Freq: Once | INTRAVENOUS | Status: DC
  Administered 2020-06-04: 10 meq via INTRAVENOUS

## 2020-06-04 MED ORDER — POTASSIUM CHLORIDE CRYS ER 20 MEQ PO TBCR
40.0000 meq | EXTENDED_RELEASE_TABLET | ORAL | Status: DC
  Administered 2020-06-04 (×2): 40 meq via ORAL
  Filled 2020-06-04 (×2): qty 2

## 2020-06-04 MED ORDER — POTASSIUM CHLORIDE CRYS ER 20 MEQ PO TBCR
40.0000 meq | EXTENDED_RELEASE_TABLET | Freq: Once | ORAL | Status: AC
  Administered 2020-06-04: 40 meq via ORAL
  Filled 2020-06-04: qty 2

## 2020-06-04 MED ORDER — POTASSIUM CHLORIDE 10 MEQ/100ML IV SOLN
10.0000 meq | INTRAVENOUS | Status: AC
  Administered 2020-06-04 (×3): 10 meq via INTRAVENOUS
  Filled 2020-06-04 (×3): qty 100

## 2020-06-04 MED ORDER — AMIODARONE HCL 200 MG PO TABS
400.0000 mg | ORAL_TABLET | Freq: Two times a day (BID) | ORAL | Status: DC
  Administered 2020-06-04 – 2020-06-05 (×3): 400 mg via ORAL
  Filled 2020-06-04 (×3): qty 2

## 2020-06-04 NOTE — Progress Notes (Addendum)
Dr. Julianne Handler paged and notified of pt's episode of dizziness and run of NSVT.  Awaiting return call.  Alonza Bogus  2215:  Return call received from Dr. Julianne Handler.  No new orders received.  Will continue to monitor closely. Alonza Bogus

## 2020-06-04 NOTE — Progress Notes (Addendum)
Notified by CCMD Pt had approx 30 bts NSVT. Upon entering room, Pt in bed but stated he had went to [facility] and got dizzy at about the same time as ectopy was noted. Pt denies feeling ICD fire,  CP or any other symptoms but states he feels like he is breathing deeper than usual. VS stable. Pt placed on 2L [place]. Upon review of strip-looks like pt had 34 bts VT and device paced out of rhythm.  Gavin Pound RN and Donnald Garre RN notified of above

## 2020-06-04 NOTE — Discharge Summary (Incomplete)
DISCHARGE SUMMARY    Patient ID: Roberto [name],  MRN: [id number], DOB/AGE: [id number] y.o.  Admit date: 06/01/2020 Discharge date: ***   Primary Care Physician: [name], MD Primary Cardiologist: Dr. Gala Romney Electrophysiologist: Dr. Elberta Fortis  Primary Discharge Diagnosis:  1. VT 2. Hypokalemia   Secondary Discharge Diagnosis:  1. Chronic CHF (combined) 2. Paroxysmal Afib     CHA2DS2Vasc is 3, on Eliquis 3. CKD (IIIb) 4. Morbid obesity 5. H/o recurrent DVTs and PE 5. Chronic venous insuffiency   Allergies  Allergen Reactions  . Shrimp [Shellfish Allergy] Hives and Itching     Procedures This Admission:  none  Brief HPI: Roberto [name] is a 55 y.o. male was walking at work when he suddenly collapsed ICD interrogation shows as below that patient had VF with appropriate therapy this am with CL as fast as 160 ms (>300 bpm), he was hypokalemic and mildly hypomagnesemic.   Hospital Course:  The patient was admitted his electrolytes replaced and started on amiodarone gtt. He has had this happen in the past when his potassium hgot low as well.   He had of late been more SOB/DOE and in the ER a week prior diuresed with IV lasixand his home torsemide dose up-titrated and seems his aldactone was as well AHF team was consulted, Dr. Gala Romney felt his volume status looks ok. Though worried his cardiomyopathy is nearing endstage. Not felt to be an ideal candidate for advanced therapies with size, CKD and ostomy. His potassium required very aggressive replacement to maintain K+ level 4.0 or better. Mag once replaced remained wnl.   He was monitored on telemetry through his stay, maintaining SR and *** no further VT.    The patient *** feels well, *** denies any CP, he does report despite felt not to be overtly volume OL with some DOE, AHF plans to pursue PFTs out patient.  He has no rest SOB and no DOE with minimal exertion, flat ground walking of about 50 feet.  He was  examined by Dr. Elberta Fortis and considered stable for discharge to home.    *** no driving  Physical Exam: Vitals:   06/03/20 0840 06/03/20 0848 06/03/20 1421 06/04/20 0415  BP: 109/73  108/72 117/77  Pulse: 66 66 71 67  Resp: 18  14 18   Temp: 97.9 F (36.6 C)  97.8 F (36.6 C) 98.2 F (36.8 C)  TempSrc: Oral  Oral Oral  SpO2: 94%  97% 98%  Weight:    (!) 154.2 kg  Height:        GEN- The patient is well appearing, alert and oriented x 3 today.   HEENT: normocephalic, atraumatic; sclera clear, conjunctiva pink; hearing intact; oropharynx clear Lungs- *** CTA b/l, normal work of breathing.  No wheezes, rales, rhonchi Heart- *** RRR, no murmurs, rubs or gallops, PMI not laterally displaced GI- soft, non-tender, non-distended Extremities- no clubbing, cyanosis, or edema MS- no significant deformity or atrophy Skin- warm and dry, no rash or lesion, left *** chest without hematoma/ecchymosis Psych- euthymic mood, full affect Neuro- no gross defecits  Labs:   Lab Results  Component Value Date   WBC 9.5 06/01/2020   HGB 16.2 06/01/2020   HCT 49.3 06/01/2020   MCV 92.5 06/01/2020   PLT 160 06/01/2020    Recent Labs  Lab 06/04/20 0735  NA 137  K 3.8  CL 100  CO2 26  BUN 20  CREATININE 2.10*  CALCIUM 8.7*  GLUCOSE 132*  Discharge Medications:  Allergies as of 06/04/2020      Reactions   Shrimp [shellfish Allergy] Hives, Itching    Med Rec must be completed prior to using this SMARTLINK***       Disposition: Home    Duration of Discharge Encounter: Greater than 30 minutes including physician time.  Norma Fredrickson, PA-C 06/04/2020 3:09 PM

## 2020-06-04 NOTE — Progress Notes (Signed)
[id number] Gave pt  CHF booklet, low sodium diets and Off the Beat booklet. Discussed daily weights, 2000 mg sodium restriction, 2L FR and signs/symptoms of CHF. Gave Off the Beat booklet for VT/Vfib. Pt receiving IV Potassium at this time that is slowly going in to decrease discomfort. Pt stated less painful if he keeps hand still. RN stated he has two more after this one. Will follow up tomorrow for ambulation. Luetta Nutting RN BSN 06/04/2020 11:21 AM

## 2020-06-04 NOTE — Progress Notes (Addendum)
Progress Note  Patient Name: Roberto [name] Date of Encounter: 06/04/2020  [facility] Cardiologist: Arvilla Meres, MD   Subjective   I feel OK, no rest SOB, no CP., some baseline DOE, more of late in the last few weeks  Inpatient Medications    Scheduled Meds:  amiodarone  400 mg Oral BID   apixaban  5 mg Oral BID   carvedilol  3.125 mg Oral BID WC   dapagliflozin propanediol  10 mg Oral Daily   digoxin  0.125 mg Oral Daily   potassium chloride SA  60 mEq Oral BID   sodium chloride flush  3 mL Intravenous Q12H   spironolactone  25 mg Oral Daily   torsemide  60 mg Oral BID   Continuous Infusions:  sodium chloride     potassium chloride     PRN Meds: sodium chloride, acetaminophen, diclofenac Sodium, ondansetron (ZOFRAN) IV, sodium chloride flush   Vital Signs    Vitals:   06/03/20 0840 06/03/20 0848 06/03/20 1421 06/04/20 0415  BP: 109/73  108/72 117/77  Pulse: 66 66 71 67  Resp: 18  14 18   Temp: 97.9 F (36.6 C)  97.8 F (36.6 C) 98.2 F (36.8 C)  TempSrc: Oral  Oral Oral  SpO2: 94%  97% 98%  Weight:    (!) 154.2 kg  Height:        Intake/Output Summary (Last 24 hours) at 06/04/2020 0734 Last data filed at 06/04/2020 0600 Gross per 24 hour  Intake 1330 ml  Output 3425 ml  Net -2095 ml   Last 3 Weights 06/04/2020 06/03/2020 06/02/2020  Weight (lbs) 340 lb 340 lb 4.8 oz 345 lb 6.4 oz  Weight (kg) 154.223 kg 154.359 kg 156.672 kg      Telemetry    SR, rare PVC, couplet - Personally Reviewed  ECG    No new EKGs - Personally Reviewed  Physical Exam   GEN: No acute distress.   Neck: No JVD Cardiac: RRR, no murmurs, rubs, or gallops.  Respiratory: CTA b/l. GI: Soft, nontender, non-distended, R abd ostomy in place MS: trace edema; No deformity. Neuro:  Nonfocal  Psych: Normal affect   Labs    High Sensitivity Troponin:   Recent Labs  Lab 05/24/20 0759 06/01/20 0909 06/01/20 1103  TROPONINIHS 29* 35* 31*      Chemistry Recent Labs   Lab 06/01/20 0909 06/02/20 0150 06/03/20 0325  NA 136 139 139  K 3.4* 3.4* 3.3*  CL 103 102 99  CO2 24 28 30   GLUCOSE 143* 119* 114*  BUN 21* 18 20  CREATININE 2.11* 1.85* 1.92*  CALCIUM 8.6* 8.4* 8.6*  GFRNONAA 37* 43* 41*  ANIONGAP 9 9 10      Hematology Recent Labs  Lab 06/01/20 0909  WBC 9.5  RBC 5.33  HGB 16.2  HCT 49.3  MCV 92.5  MCH 30.4  MCHC 32.9  RDW 13.7  PLT 160    BNP Recent Labs  Lab 06/01/20 0909  BNP 773.7*     DDimer No results for input(s): DDIMER in the last 168 hours.   Radiology    No results found.  Cardiac Studies    12/14/2019: TTE IMPRESSIONS   1. Left ventricular ejection fraction, by estimation, is 25 to 30%. The  left ventricle has severely decreased function. Left ventricular  endocardial border not optimally defined to evaluate regional wall motion.  The left ventricular internal cavity size   was moderately dilated. There is mild left ventricular  hypertrophy. Left  ventricular diastolic parameters are consistent with Grade III diastolic  dysfunction (restrictive).   2. Right ventricular systolic function is normal. The right ventricular  size is normal.   3. The mitral valve is normal in structure. No evidence of mitral valve  regurgitation. No evidence of mitral stenosis.   4. The aortic valve is normal in structure. Aortic valve regurgitation is  not visualized. No aortic stenosis is present.   5. The inferior vena cava is normal in size with greater than 50%  respiratory variability, suggesting right atrial pressure of 3 mmHg.   Patient Profile     55 y.o. 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   Admitted with syncope VT > ATP > VF > shock, again hypokalemic  Assessment & Plan    VT/VF Transition back to PO amiodarone today   2. Chronic Combined  Systolic/Diastolic  Heart Failure  3. Hypokalemic Echo 12/21 EF 25-30% Not felt to bo overtly volume OL this admission PO K+ has been increased as well as his aldactone dose K+ increased yesterday Chelsey Kimberley get 60mg  BID today, plus 40 total of IV Mag is ok I Charlisha Market reach out to heart failure team for thoughts on K+ replacement and home HF regime   Hopefully d/c tomorrow   3. PAF S/P A Flutter Ablation 2018 CHA2DS2Vasc is 3, on Elquis   4. CKD  Stage IIIb Creatinine baseline 1.8-2 Creatinine today 1.92 Follow BMET daily.    5. Obesity Body mass index is 54.47 kg/m.    6.  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 No plan for reversal of ileostomy after extensive abdominal hernia    For questions or updates, please contact [facility] Please consult [email or web address] for contact info under        Signed, 09/07/19, PA-C  06/04/2020, 7:34 AM    I have seen and examined this patient with 06/06/2020.  Agree with above, note added to reflect my findings.  On exam, RRR, no murmurs.  No further arrhythmias overnight.  We Yousuf Ager switch to p.o. amiodarone today.  If he continues to have no further arrhythmias, Roddy Bellamy plan for discharge tomorrow.  Roma Bondar M. Stephana Morell MD 06/04/2020 9:49 AM

## 2020-06-04 NOTE — Progress Notes (Addendum)
Advanced Heart Failure Rounding Note  PCP-Cardiologist: Glori Bickers, MD   Subjective:    Earlier today transitioned to po amio.  Labs pending.   Remains SOB with exertion.   Objective:   Weight Range: (!) 154.2 kg Body mass index is 51.7 kg/m.   Vital Signs:   Temp:  [97.8 F (36.6 C)-98.2 F (36.8 C)] 98.2 F (36.8 C) (05/23 0415) Pulse Rate:  [67-71] 67 (05/23 0415) Resp:  [14-18] 18 (05/23 0415) BP: (108-117)/(72-77) 117/77 (05/23 0415) SpO2:  [97 %-98 %] 98 % (05/23 0415) Weight:  [154.2 kg] 154.2 kg (05/23 0415) Last BM Date: 06/03/20  Weight change: Filed Weights   06/02/20 0407 06/03/20 0423 06/04/20 0415  Weight: (!) 156.7 kg (!) 154.4 kg (!) 154.2 kg    Intake/Output:   Intake/Output Summary (Last 24 hours) at 06/04/2020 1040 Last data filed at 06/04/2020 1040 Gross per 24 hour  Intake 1330 ml  Output 4400 ml  Net -3070 ml      Physical Exam  General:  In bed.  No resp difficulty HEENT: normal Neck: supple. no JVD. Carotids 2+ bilat; no bruits. No lymphadenopathy or thryomegaly appreciated. Cor: PMI nondisplaced. Regular rate & rhythm. No rubs, gallops or murmurs. Lungs: clear Abdomen: obese, soft, nontender, nondistended. No hepatosplenomegaly. No bruits or masses. Good bowel sounds. Extremities: no cyanosis, clubbing, rash, edema Neuro: alert & orientedx3, cranial nerves grossly intact. moves all 4 extremities w/o difficulty. Affect pleasant  Telemetry   SR with rare PVCs.   EKG    N/A  Labs    CBC No results for input(s): WBC, NEUTROABS, HGB, HCT, MCV, PLT in the last 72 hours. Basic Metabolic Panel Recent Labs    06/03/20 0325 06/04/20 0735  NA 139 137  K 3.3* 3.8  CL 99 100  CO2 30 26  GLUCOSE 114* 132*  BUN 20 20  CREATININE 1.92* 2.10*  CALCIUM 8.6* 8.7*  MG 2.0 2.0   Liver Function Tests No results for input(s): AST, ALT, ALKPHOS, BILITOT, PROT, ALBUMIN in the last 72 hours. No results for input(s): LIPASE,  AMYLASE in the last 72 hours. Cardiac Enzymes No results for input(s): CKTOTAL, CKMB, CKMBINDEX, TROPONINI in the last 72 hours.  BNP: BNP (last 3 results) Recent Labs    04/13/20 1126 05/24/20 0759 06/01/20 0909  BNP 917.0* 583.6* 773.7*    ProBNP (last 3 results) No results for input(s): PROBNP in the last 8760 hours.   D-Dimer No results for input(s): DDIMER in the last 72 hours. Hemoglobin A1C No results for input(s): HGBA1C in the last 72 hours. Fasting Lipid Panel No results for input(s): CHOL, HDL, LDLCALC, TRIG, CHOLHDL, LDLDIRECT in the last 72 hours. Thyroid Function Tests No results for input(s): TSH, T4TOTAL, T3FREE, THYROIDAB in the last 72 hours.  Invalid input(s): FREET3  Other results:   Imaging    No results found.   Medications:     Scheduled Medications: . amiodarone  400 mg Oral BID  . apixaban  5 mg Oral BID  . carvedilol  3.125 mg Oral BID WC  . dapagliflozin propanediol  10 mg Oral Daily  . digoxin  0.125 mg Oral Daily  . potassium chloride SA  40 mEq Oral Q4H  . sodium chloride flush  3 mL Intravenous Q12H  . spironolactone  25 mg Oral Daily  . torsemide  60 mg Oral BID    Infusions: . sodium chloride    . potassium chloride 10 mEq (06/04/20 1036)  PRN Medications: sodium chloride, acetaminophen, diclofenac Sodium, ondansetron (ZOFRAN) IV, sodium chloride flush     Assessment/Plan  1. VF -Shock x1.  -Supp K  -Started on amio drip-- today transitioned to po amio.   2. Chronic Combined Systolic/Diastolic  Heart Failure  Echo 12/21 EF 25-30% -NYHA III. Volume status appears ok. Optivol on admit suggestive of fluid accumulation.  -Continue torsemide 60 mg daily  - Continue spiro 25 mg daily  - Continue farxiga 10 mg daily - Follow renal function   3. PAF S/P A Flutter Ablation 2018 On po amio   4. CKD  Stage IIIb -Creatinine baseline 1.8-2 -Creatinine today 2.1 today   5. Obesity Body mass index is 54.47  kg/m.   6.  H/o Acute diverticulitis with colon resection and ileostomy - S/p skin grafting 09/05/19. -No plan for reversal of ileostomy after extensive abdominal hernia  7. Dyspnea  Does not appear volume overloaded. Optivl was stable as well as [place] 04/17/20 Given he has been on amiodarone will need to set up PFTs. ? Role amio playing in dyspnea.    Length of Stay: 3  Amy Clegg, NP  06/04/2020, 10:40 AM  Advanced Heart Failure Team Pager [phone] (M-F; 7a - 5p)  Please contact [facility] for night-coverage after hours (5p -7a ) and weekends on [email or web address]  Patient seen with NP, agree with the above note.   Stable today, appears euvolemic but still with subjective dyspnea. Creatinine 2.1.  Switching to po amiodarone today, probably ready for home tomorrow.  Would consider the possibility of amiodarone lung toxicity, can get PFTs as outpatient and send ESR today.   Continue torsemide 60 mg bid for now.   Loralie Champagne 06/04/2020

## 2020-06-05 DIAGNOSIS — I5042 Chronic combined systolic (congestive) and diastolic (congestive) heart failure: Secondary | ICD-10-CM | POA: Diagnosis not present

## 2020-06-05 DIAGNOSIS — I472 Ventricular tachycardia: Secondary | ICD-10-CM | POA: Diagnosis not present

## 2020-06-05 DIAGNOSIS — N1832 Chronic kidney disease, stage 3b: Secondary | ICD-10-CM | POA: Diagnosis not present

## 2020-06-05 LAB — BASIC METABOLIC PANEL
Anion gap: 11 (ref 5–15)
BUN: 22 mg/dL — ABNORMAL HIGH (ref 6–20)
CO2: 28 mmol/L (ref 22–32)
Calcium: 9.1 mg/dL (ref 8.9–10.3)
Chloride: 99 mmol/L (ref 98–111)
Creatinine, Ser: 2.11 mg/dL — ABNORMAL HIGH (ref 0.61–1.24)
GFR, Estimated: 37 mL/min — ABNORMAL LOW (ref >60)
Glucose, Bld: 112 mg/dL — ABNORMAL HIGH (ref 70–99)
Potassium: 3.9 mmol/L (ref 3.5–5.1)
Sodium: 138 mmol/L (ref 135–145)

## 2020-06-05 LAB — MAGNESIUM: Magnesium: 2.1 mg/dL (ref 1.7–2.4)

## 2020-06-05 MED ORDER — POTASSIUM CHLORIDE CRYS ER 20 MEQ PO TBCR: 20.0000 meq | EXTENDED_RELEASE_TABLET | Freq: Two times a day (BID) | ORAL | Status: DC

## 2020-06-05 MED ORDER — POTASSIUM CHLORIDE CRYS ER 20 MEQ PO TBCR: 40.0000 meq | EXTENDED_RELEASE_TABLET | Freq: Three times a day (TID) | ORAL | Status: DC

## 2020-06-05 MED ORDER — AMIODARONE HCL IN DEXTROSE 360-4.14 MG/200ML-% IV SOLN
30.0000 mg/h | INTRAVENOUS | Status: DC
  Administered 2020-06-06 – 2020-06-07 (×3): 30 mg/h via INTRAVENOUS
  Filled 2020-06-05 (×5): qty 200

## 2020-06-05 MED ORDER — SPIRONOLACTONE 25 MG PO TABS
50.0000 mg | ORAL_TABLET | Freq: Every day | ORAL | Status: DC
  Administered 2020-06-05 – 2020-06-10 (×6): 50 mg via ORAL
  Filled 2020-06-05 (×6): qty 2

## 2020-06-05 MED ORDER — AMIODARONE HCL IN DEXTROSE 360-4.14 MG/200ML-% IV SOLN
60.0000 mg/h | INTRAVENOUS | Status: DC
  Administered 2020-06-05 (×2): 60 mg/h via INTRAVENOUS
  Filled 2020-06-05: qty 200

## 2020-06-05 MED ORDER — POTASSIUM CHLORIDE CRYS ER 20 MEQ PO TBCR
40.0000 meq | EXTENDED_RELEASE_TABLET | Freq: Two times a day (BID) | ORAL | Status: DC
  Administered 2020-06-05 (×2): 40 meq via ORAL
  Filled 2020-06-05 (×2): qty 2

## 2020-06-05 MED ORDER — RANOLAZINE ER 500 MG PO TB12
500.0000 mg | ORAL_TABLET | Freq: Two times a day (BID) | ORAL | Status: DC
  Administered 2020-06-05 – 2020-06-10 (×11): 500 mg via ORAL
  Filled 2020-06-05 (×11): qty 1

## 2020-06-05 MED ORDER — AMIODARONE LOAD VIA INFUSION
150.0000 mg | Freq: Once | INTRAVENOUS | Status: AC
  Administered 2020-06-05: 150 mg via INTRAVENOUS
  Filled 2020-06-05: qty 83.34

## 2020-06-05 MED ORDER — AMIODARONE LOAD VIA INFUSION: 150.0000 mg | Freq: Once | INTRAVENOUS | Status: DC

## 2020-06-05 MED ORDER — MEXILETINE HCL 150 MG PO CAPS
300.0000 mg | ORAL_CAPSULE | Freq: Two times a day (BID) | ORAL | Status: DC
  Administered 2020-06-05 – 2020-06-06 (×4): 300 mg via ORAL
  Filled 2020-06-05 (×5): qty 2

## 2020-06-05 NOTE — Progress Notes (Signed)
[phone] Came to see pt to walk. Pt stated he has been having more runs of irregular heart rhythm. Had short run while I was talking with pt. Holding activity. EP up to see pt.and amio drip to be started. Luetta Nutting RN BSN 06/05/2020 1:42 PM

## 2020-06-05 NOTE — Progress Notes (Signed)
Advanced Heart Failure Rounding Note  PCP-Cardiologist: Arvilla Meres, MD   Subjective:    Had recurrent VT last night with successful ATP after IV transitioned to po. Started on mexilitene.  K 3.9 today  Denies orthopnea or PND.     Objective:   Weight Range: (!) 150.1 kg Body mass index is 50.31 kg/m.   Vital Signs:   Temp:  [97.3 F (36.3 C)-99.1 F (37.3 C)] 98.1 F (36.7 C) (05/24 1123) Pulse Rate:  [66-72] 72 (05/24 1053) Resp:  [17-19] 19 (05/24 1123) BP: (103-119)/(68-82) 116/74 (05/24 1123) SpO2:  [95 %-97 %] 95 % (05/24 1123) Weight:  [150.1 kg] 150.1 kg (05/24 0447) Last BM Date: 06/03/20  Weight change: Filed Weights   06/03/20 0423 06/04/20 0415 06/05/20 0447  Weight: (!) 154.4 kg (!) 154.2 kg (!) 150.1 kg    Intake/Output:   Intake/Output Summary (Last 24 hours) at 06/05/2020 1311 Last data filed at 06/05/2020 1151 Gross per 24 hour  Intake 507.84 ml  Output 3850 ml  Net -3342.16 ml      Physical Exam   General:  Well appearing. No resp difficulty HEENT: normal Neck: supple. no JVD. Carotids 2+ bilat; no bruits. No lymphadenopathy or thryomegaly appreciated. Cor: PMI nondisplaced. Regular rate & rhythm. No rubs, gallops or murmurs. Lungs: clear Abdomen: obese soft, nontender, nondistended. No hepatosplenomegaly. No bruits or masses. Good bowel sounds. + ostomy bag Extremities: no cyanosis, clubbing, rash, edema Neuro: alert & orientedx3, cranial nerves grossly intact. moves all 4 extremities w/o difficulty. Affect pleasant   Telemetry   SR with VT overnight. Personally reviewed  Labs    CBC No results for input(s): WBC, NEUTROABS, HGB, HCT, MCV, PLT in the last 72 hours. Basic Metabolic Panel Recent Labs    86/57/84 0735 06/04/20 1408 06/05/20 0231  NA 137 137 138  K 3.8 4.0 3.9  CL 100 101 99  CO2 26 27 28   GLUCOSE 132* 135* 112*  BUN 20 20 22*  CREATININE 2.10* 1.91* 2.11*  CALCIUM 8.7* 8.6* 9.1  MG 2.0  --  2.1    Liver Function Tests No results for input(s): AST, ALT, ALKPHOS, BILITOT, PROT, ALBUMIN in the last 72 hours. No results for input(s): LIPASE, AMYLASE in the last 72 hours. Cardiac Enzymes No results for input(s): CKTOTAL, CKMB, CKMBINDEX, TROPONINI in the last 72 hours.  BNP: BNP (last 3 results) Recent Labs    04/13/20 1126 05/24/20 0759 06/01/20 0909  BNP 917.0* 583.6* 773.7*    ProBNP (last 3 results) No results for input(s): PROBNP in the last 8760 hours.   D-Dimer No results for input(s): DDIMER in the last 72 hours. Hemoglobin A1C No results for input(s): HGBA1C in the last 72 hours. Fasting Lipid Panel No results for input(s): CHOL, HDL, LDLCALC, TRIG, CHOLHDL, LDLDIRECT in the last 72 hours. Thyroid Function Tests No results for input(s): TSH, T4TOTAL, T3FREE, THYROIDAB in the last 72 hours.  Invalid input(s): FREET3  Other results:   Imaging    No results found.   Medications:     Scheduled Medications: . amiodarone  400 mg Oral BID  . apixaban  5 mg Oral BID  . carvedilol  3.125 mg Oral BID WC  . dapagliflozin propanediol  10 mg Oral Daily  . digoxin  0.125 mg Oral Daily  . mexiletine  300 mg Oral Q12H  . potassium chloride SA  40 mEq Oral BID  . sodium chloride flush  3 mL Intravenous Q12H  .  spironolactone  50 mg Oral Daily  . torsemide  60 mg Oral BID    Infusions: . sodium chloride      PRN Medications: sodium chloride, acetaminophen, diclofenac Sodium, ondansetron (ZOFRAN) IV, sodium chloride flush     Assessment/Plan  1. VF/VT -Shock x1.  -Now with recurrent VT. EP started on mexilitene   2. Chronic Combined Systolic/Diastolic  Heart Failure  Echo 00/93 EF 25-30% -NYHA III. Volume status appears ok. -Continue torsemide 60 mg daily  - Agree with increasing spiro to 50 daily - Continue farxiga 10 mg daily - he is nearing end-stage. Not candidate for VAD with CKD. Large hernia and ostomy   3. PAF S/P A Flutter  Ablation 2018 On po amio   4. CKD  Stage IIIb -Creatinine baseline 1.8-2 -Creatinine stable at 2.1 today  5. Obesity Body mass index is 54.47 kg/m.   6.  H/o Acute diverticulitis with colon resection and ileostomy - S/p skin grafting 09/05/19. -No plan for reversal of ileostomy after extensive abdominal hernia    Length of Stay: 4  Arvilla Meres, MD  06/05/2020, 1:11 PM  Advanced Heart Failure Team Pager [phone] (M-F; 7a - 5p)  Please contact [facility] for night-coverage after hours (5p -7a ) and weekends on [email or web address]

## 2020-06-05 NOTE — Progress Notes (Signed)
1250 RN called by tele, pt had runs of Vtach below. Pt was using urinal 1 of those times. RN paged NP on call. New orders received. WCTM.   1247 - 26 beat run 1320 - 17 beat run 1322 - 21 beat run 1337 - 16 beat run  Plan to re-bolus with amio, start drip and NP will also add new medication. Will wait for orders. Pt resting in bed. Stated he can feel his heart flutter, experiences dizziness and SOB. O2 applied, WCTM.

## 2020-06-05 NOTE — Progress Notes (Addendum)
Progress Note  Patient Name: Roberto [name] Date of Encounter: 06/05/2020  [facility] Cardiologist: Arvilla Meres, MD   Subjective   no rest SOB, no CP., some DOE, got lightheaded last evening with VT  Inpatient Medications    Scheduled Meds: . amiodarone  400 mg Oral BID  . apixaban  5 mg Oral BID  . carvedilol  3.125 mg Oral BID WC  . dapagliflozin propanediol  10 mg Oral Daily  . digoxin  0.125 mg Oral Daily  . mexiletine  300 mg Oral Q12H  . potassium chloride SA  40 mEq Oral TID  . sodium chloride flush  3 mL Intravenous Q12H  . spironolactone  25 mg Oral Daily  . torsemide  60 mg Oral BID   Continuous Infusions: . sodium chloride     PRN Meds: sodium chloride, acetaminophen, diclofenac Sodium, ondansetron (ZOFRAN) IV, sodium chloride flush   Vital Signs    Vitals:   06/04/20 2300 06/05/20 0002 06/05/20 0447 06/05/20 0803  BP:  113/80 119/76 103/75  Pulse:   66 67  Resp:   17 18  Temp:  (!) 97.3 F (36.3 C) 97.9 F (36.6 C) 99.1 F (37.3 C)  TempSrc: Oral Oral Oral Oral  SpO2:   97% 95%  Weight:   (!) 150.1 kg   Height:        Intake/Output Summary (Last 24 hours) at 06/05/2020 0823 Last data filed at 06/05/2020 0003 Gross per 24 hour  Intake 327.84 ml  Output 3725 ml  Net -3397.16 ml   Last 3 Weights 06/05/2020 06/04/2020 06/03/2020  Weight (lbs) 330 lb 14.4 oz 340 lb 340 lb 4.8 oz  Weight (kg) 150.095 kg 154.223 kg 154.359 kg      Telemetry    SR, occ PVCs, had VT with successful ATP last night - Personally Reviewed  ECG    No new EKGs - Personally Reviewed  Physical Exam   Examined by Dr. Elberta Fortis, exam remains unchanged GEN: No acute distress.   Neck: No JVD Cardiac: RRR, no murmurs, rubs, or gallops.  Respiratory: CTA b/l. GI: Soft, nontender, non-distended, R abd ostomy in place MS: trace edema; No deformity. Neuro:  Nonfocal  Psych: Normal affect   Labs    High Sensitivity Troponin:   Recent Labs  Lab 05/24/20 0759  06/01/20 0909 06/01/20 1103  TROPONINIHS 29* 35* 31*      Chemistry Recent Labs  Lab 06/04/20 0735 06/04/20 1408 06/05/20 0231  NA 137 137 138  K 3.8 4.0 3.9  CL 100 101 99  CO2 26 27 28   GLUCOSE 132* 135* 112*  BUN 20 20 22*  CREATININE 2.10* 1.91* 2.11*  CALCIUM 8.7* 8.6* 9.1  GFRNONAA 37* 41* 37*  ANIONGAP 11 9 11      Hematology Recent Labs  Lab 06/01/20 0909  WBC 9.5  RBC 5.33  HGB 16.2  HCT 49.3  MCV 92.5  MCH 30.4  MCHC 32.9  RDW 13.7  PLT 160    BNP Recent Labs  Lab 06/01/20 0909  BNP 773.7*     DDimer No results for input(s): DDIMER in the last 168 hours.   Radiology    No results found.  Cardiac Studies    12/14/2019: TTE IMPRESSIONS   1. Left ventricular ejection fraction, by estimation, is 25 to 30%. The  left ventricle has severely decreased function. Left ventricular  endocardial border not optimally defined to evaluate regional wall motion.  The left ventricular internal cavity size  was moderately dilated. There is mild left ventricular hypertrophy. Left  ventricular diastolic parameters are consistent with Grade III diastolic  dysfunction (restrictive).   2. Right ventricular systolic function is normal. The right ventricular  size is normal.   3. The mitral valve is normal in structure. No evidence of mitral valve  regurgitation. No evidence of mitral stenosis.   4. The aortic valve is normal in structure. Aortic valve regurgitation is  not visualized. No aortic stenosis is present.   5. The inferior vena cava is normal in size with greater than 50%  respiratory variability, suggesting right atrial pressure of 3 mmHg.   Patient Profile     55 y.o. 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   Admitted with syncope VT > ATP > VF > shock, again hypokalemic  Assessment &  Plan    1. VT/VF Transitioned to PO amiodarone yesterday Had VT with ATP last night, adding mexiletine   2. Chronic Combined Systolic/Diastolic  Heart Failure  3. Hypokalemic Echo 12/21 EF 25-30% Not felt to bo overtly volume OL this admission K+ 3.9 today (got total of yesterday) Aleira Deiter plan BID today and increase aldactone to 50mg  Mag is ok Dr. Lidiya Reise discuss with HF team thoughts on diuretics and K+ replacement as well, might consider Aldactone BID   3. PAF S/P A Flutter Ablation 2018 CHA2DS2Vasc is 3, on Elquis   4. CKD  Stage IIIb Creatinine baseline 1.8-2 Creatinine today 2.11 Follow BMET daily.    5. Obesity Body mass index is 54.47 kg/m.    6.  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 No plan for reversal of ileostomy after extensive abdominal hernia    For questions or updates, please contact [facility] Please consult [email or web address] for contact info under        Signed, 09/07/19, PA-C  06/05/2020, 8:23 AM    I have seen and examined this patient with 06/07/2020.  Agree with above, note added to reflect my findings.  On exam, RRR, no mumurs.  Unfortunately last night, the patient got dizzy.  This correlated to an episode of ventricular tachycardia on telemetry treated successfully with ATP therapy.  He had been switched from IV to p.o. amiodarone prior to this.  We Haasini Patnaude continue with p.o. amiodarone and mexiletine 300 mg twice daily.  He has also been hypokalemic getting quite a bit of potassium.  We Othelia Riederer increase his Aldactone to 50 mg.  If he remains without ventricular arrhythmia over the next 24 hours, Hercules Hasler potentially plan for discharge.  Sriyan Cutting M. Ani Deoliveira MD 06/05/2020 11:17 AM

## 2020-06-05 NOTE — Progress Notes (Signed)
Notified of recurrent VT runs on telemetry RN reports he feels lightheaded and breathless with them, otherwise has felt well. BP has been stable Telemetry was reviewed, one episode again treated with ATP successfully 2 were NSVT The patient was with a visitor praying, I did not interrupt.  D/w Dr. Elberta Fortis Restart amiodarone gtt, I will re-bolus and load Start ranexa as well. May need to consider palliative discussions  [name], [place]

## 2020-06-06 DIAGNOSIS — Z7189 Other specified counseling: Secondary | ICD-10-CM | POA: Diagnosis not present

## 2020-06-06 DIAGNOSIS — I472 Ventricular tachycardia: Secondary | ICD-10-CM | POA: Diagnosis not present

## 2020-06-06 DIAGNOSIS — Z515 Encounter for palliative care: Secondary | ICD-10-CM | POA: Diagnosis not present

## 2020-06-06 DIAGNOSIS — I5022 Chronic systolic (congestive) heart failure: Secondary | ICD-10-CM | POA: Diagnosis not present

## 2020-06-06 LAB — BASIC METABOLIC PANEL WITH GFR
Anion gap: 9 (ref 5–15)
BUN: 23 mg/dL — ABNORMAL HIGH (ref 6–20)
CO2: 28 mmol/L (ref 22–32)
Calcium: 8.9 mg/dL (ref 8.9–10.3)
Chloride: 98 mmol/L (ref 98–111)
Creatinine, Ser: 2.23 mg/dL — ABNORMAL HIGH (ref 0.61–1.24)
GFR, Estimated: 34 mL/min — ABNORMAL LOW
Glucose, Bld: 133 mg/dL — ABNORMAL HIGH (ref 70–99)
Potassium: 4 mmol/L (ref 3.5–5.1)
Sodium: 135 mmol/L (ref 135–145)

## 2020-06-06 LAB — MAGNESIUM: Magnesium: 1.9 mg/dL (ref 1.7–2.4)

## 2020-06-06 MED ORDER — MAGNESIUM SULFATE 2 GM/50ML IV SOLN
2.0000 g | Freq: Once | INTRAVENOUS | Status: AC
  Administered 2020-06-06: 2 g via INTRAVENOUS
  Filled 2020-06-06: qty 50

## 2020-06-06 MED ORDER — POTASSIUM CHLORIDE CRYS ER 20 MEQ PO TBCR
40.0000 meq | EXTENDED_RELEASE_TABLET | Freq: Once | ORAL | Status: AC
  Administered 2020-06-06: 40 meq via ORAL
  Filled 2020-06-06: qty 2

## 2020-06-06 MED ORDER — POTASSIUM CHLORIDE CRYS ER 20 MEQ PO TBCR
40.0000 meq | EXTENDED_RELEASE_TABLET | Freq: Two times a day (BID) | ORAL | Status: DC
  Administered 2020-06-06 – 2020-06-08 (×5): 40 meq via ORAL
  Filled 2020-06-06 (×5): qty 2

## 2020-06-06 NOTE — Progress Notes (Signed)
Patient had 15 beats V. Dr Elberta Fortis notified and aware. No new orders received.

## 2020-06-06 NOTE — Progress Notes (Addendum)
Advanced Heart Failure Rounding Note  PCP-Cardiologist: Arvilla Meres, MD   Subjective:    5/24 in the afternoon had VT with successful ATP. On ranolazine, amio, and mexiletine.   Placed on oxgen earlier today. He remains short of breath with minimal exertion.     Objective:   Weight Range: (!) 151.3 kg Body mass index is 50.72 kg/m.   Vital Signs:   Temp:  [98.1 F (36.7 C)-98.4 F (36.9 C)] 98.1 F (36.7 C) (05/25 0417) Pulse Rate:  [70-73] 72 (05/25 1026) Resp:  [18-19] 18 (05/25 0417) BP: (107-116)/(70-81) 107/76 (05/25 0835) SpO2:  [93 %-98 %] 93 % (05/25 0417) Weight:  [151.3 kg] 151.3 kg (05/25 0417) Last BM Date: 06/07/20  Weight change: Filed Weights   06/04/20 0415 06/05/20 0447 06/06/20 0417  Weight: (!) 154.2 kg (!) 150.1 kg (!) 151.3 kg    Intake/Output:   Intake/Output Summary (Last 24 hours) at 06/06/2020 1142 Last data filed at 06/06/2020 0844 Gross per 24 hour  Intake 1991.23 ml  Output 2010 ml  Net -18.77 ml      Physical Exam   General:  Appears fatigued.. No resp difficulty HEENT: normal Neck: supple. JVP 7-8 . Carotids 2+ bilat; no bruits. No lymphadenopathy or thryomegaly appreciated. Cor: PMI nondisplaced. Regular rate & rhythm. No rubs, gallops or murmurs. Lungs: clear on 2 liters  Abdomen: obese, soft, nontender, nondistended. No hepatosplenomegaly. No bruits or masses. Good bowel sounds. Ileostomy  Extremities: no cyanosis, clubbing, rash, edema Neuro: alert & orientedx3, cranial nerves grossly intact. moves all 4 extremities w/o difficulty. Affect pleasant   Telemetry    SR with occasional PVC  5/24 had VT with successful ATP.   Labs    CBC No results for input(s): WBC, NEUTROABS, HGB, HCT, MCV, PLT in the last 72 hours. Basic Metabolic Panel Recent Labs    50/93/26 0231 06/06/20 0102  NA 138 135  K 3.9 4.0  CL 99 98  CO2 28 28  GLUCOSE 112* 133*  BUN 22* 23*  CREATININE 2.11* 2.23*  CALCIUM 9.1 8.9  MG  2.1 1.9   Liver Function Tests No results for input(s): AST, ALT, ALKPHOS, BILITOT, PROT, ALBUMIN in the last 72 hours. No results for input(s): LIPASE, AMYLASE in the last 72 hours. Cardiac Enzymes No results for input(s): CKTOTAL, CKMB, CKMBINDEX, TROPONINI in the last 72 hours.  BNP: BNP (last 3 results) Recent Labs    04/13/20 1126 05/24/20 0759 06/01/20 0909  BNP 917.0* 583.6* 773.7*    ProBNP (last 3 results) No results for input(s): PROBNP in the last 8760 hours.   D-Dimer No results for input(s): DDIMER in the last 72 hours. Hemoglobin A1C No results for input(s): HGBA1C in the last 72 hours. Fasting Lipid Panel No results for input(s): CHOL, HDL, LDLCALC, TRIG, CHOLHDL, LDLDIRECT in the last 72 hours. Thyroid Function Tests No results for input(s): TSH, T4TOTAL, T3FREE, THYROIDAB in the last 72 hours.  Invalid input(s): FREET3  Other results:   Imaging    No results found.   Medications:     Scheduled Medications: . apixaban  5 mg Oral BID  . carvedilol  3.125 mg Oral BID WC  . dapagliflozin propanediol  10 mg Oral Daily  . digoxin  0.125 mg Oral Daily  . mexiletine  300 mg Oral Q12H  . potassium chloride  40 mEq Oral Once  . potassium chloride SA  40 mEq Oral BID  . ranolazine  500 mg Oral BID  .  sodium chloride flush  3 mL Intravenous Q12H  . spironolactone  50 mg Oral Daily  . torsemide  60 mg Oral BID    Infusions: . sodium chloride    . amiodarone 30 mg/hr (06/06/20 0417)    PRN Medications: sodium chloride, acetaminophen, diclofenac Sodium, ondansetron (ZOFRAN) IV, sodium chloride flush     Assessment/Plan  1. VF/VT -Shock x1. -Now with recurrent VT 5/24 with ATP  - On mexilitene + ranolazine.  -K stable.  - Give 2 grams Mag  - Not a candidate for VT ablation due to BMI  2. Chronic Combined Systolic/Diastolic  Heart Failure  Echo 63/78 EF 25-30% -NYHA III. Volume status ok. Remains short of breath with minimal exertion.   -Continue torsemide 60 mg daily  - Continue spiro to 50 daily - Continue farxiga 10 mg daily - he is nearing end-stage. Not candidate for VAD with CKD. Large hernia and ostomy   3. PAF S/P A Flutter Ablation 2018 On po amio   4. CKD  Stage IIIb -Creatinine baseline 1.8-2 -Creatinine 2.1>2.23  5. Obesity Body mass index is 50.72 kg/m.  6.  H/o Acute diverticulitis with colon resection and ileostomy - S/p skin grafting 09/05/19. -No plan for reversal of ileostomy after extensive abdominal hernia  Discussed with him that we are running out of options.  Not a candidate for ablation here due to BMI. Not sure if he would be a candidate for an ablation at an academic center. Dr Leory Plowman to follow today.    Discussed Palliative Consult for goals of care and he is agreeable. He lives alone and has 5 adult children between the ages of 54 & 77.   Length of Stay: 5  Amy Clegg, NP  06/06/2020, 11:42 AM  Advanced Heart Failure Team Pager [phone] (M-F; 7a - 5p)  Please contact [facility] for night-coverage after hours (5p -7a ) and weekends on [email or web address]  Patient seen and examined with the above-signed Advanced Practice Provider and/or Housestaff. I personally reviewed laboratory data, imaging studies and relevant notes. I independently examined the patient and formulated the important aspects of the plan. I have edited the note to reflect any of my changes or salient points. I have personally discussed the plan with the patient and/or family.  Continues with VT despite amio and mexilitene. Broke with ATP. Felt not to be candidate for ablation. Remains SOB with mild exertion.   General:  Lying in bed No resp difficulty HEENT: normal Neck: supple. no JVD. Carotids 2+ bilat; no bruits. No lymphadenopathy or thryomegaly appreciated. Cor: PMI nondisplaced. Regular rate & rhythm. No rubs, gallops or murmurs. Lungs: clear Abdomen: Obese soft, nontender, nondistended. No  hepatosplenomegaly. No bruits or masses. Good bowel sounds. + hernia and ostomy Extremities: no cyanosis, clubbing, rash, edema Neuro: alert & orientedx3, cranial nerves grossly intact. moves all 4 extremities w/o difficulty. Affect pleasant  Continues to have VT despite dual AAD. Discussed with EP and felt to have no further options. He has very advanced HF. Not candidate for advanced therapies. Will begin GOC discussions.   Arvilla Meres, MD  6:06 PM

## 2020-06-06 NOTE — Consult Note (Signed)
Consultation Note Date: 06/06/2020   Patient Name: Roberto [name]  DOB: 12/26/1965  MRN: [id number]  Age / Sex: 55 y.o., male  PCP: Denita Lung, MD Referring Physician: Constance Haw, MD  Reason for Consultation: Establishing goals of care  HPI/Patient Profile: 55 y.o. 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 Hospitalization complicated by ongoing VT with EP and heart failure teams following for recommendations. Unfortunately his options are very limited. Not a candidate for ablation due to BMI or advanced therapies due to CKD, hernia, ostomy.   Clinical Assessment and Goals of Care: I met today with Broadus [name]. No family/visitors at bedside. [name] is fatigued but alert and oriented without distress. [name] has good understanding of his overall health status. He tells me that he feels his heart rhythm has been better today. He reflects that he has had very good quality of life and really began to have symptoms ~2 months ago when he had fluid overload and adjustments in his diuretics which has led to hypokalemia and he has had issues really for past 2 months. He does recognize that there are not really any other options past what is already being done to help him to improve. We discussed trajectory and expectations with advanced heart disease. [name] continues to be hopeful that with optimizing his current medications and close monitoring of potassium/magnesium that his heart rhythm will regulate.   [name] and I spoke further about his wishes if he were to continue to decline. We discussed the importance of quality of life and how his decisions and wishes effect  his quality of life. We discussed code status and resuscitation and the limited benefits of resuscitation efforts in the setting of advanced heart disease. We discussed Living Will and I encouraged Roberto [name] to review and consider his wishes and how aggressive he wishes for his care if we know options are limited to help him improve. I also spent time discussing with Roberto [name] the importance of having a HCPOA as a person he would trust to make the decisions that he would make for himself. I encouraged Roberto [name] to have open conversations with his HCPOA so they are aware of his status and wishes in case they have to speak for him one day. Roberto [name] agrees these are important decisions to be made and agrees to reviewing HCPOA/Living Will which I provided to him along with Hard Choices booklet. Roberto [name] also mentioned awareness of hospice services and we discussed the levels of hospice support to provide assistance with quality of life and symptom management in home when prognosis < 6 months and hospice facility when time is much more limited. [name] is not prepared to make any decisions today.   Roberto [name] has good support with a brother living closest in [place]. He is making tentative plans to  stay with his brother at time of d/c for extra assistance. He has 5 adult children but the oldest 2 children are the most involved in his life and they live in [place] but are very supportive. 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   All questions/concerns addressed. Emotional support provided.   Primary Decision Maker PATIENT    SUMMARY OF RECOMMENDATIONS   - Roberto [name] needs time to consider his wishes and plan. We covered a lot today which was fairly overwhelming and a lot for Roberto [name] to consider.  - He is hopeful for some level of improvement to be able to d/c with family support and continue working remotely.  - I will ask my colleague to follow up with Broadus [name] Friday to give him a couple  days to process our conversation.   Code Status/Advance Care Planning:  Full code - considering his wishes   Symptom Management:   Per EP and heart failure team.   Prognosis:   Overall prognosis poor with advanced heart failure, recurrent VT, and not a candidate for advanced therapies.   Discharge Planning: To Be Determined      Primary Diagnoses: Present on Admission: **None**   I have reviewed the medical record, interviewed the patient and family, and examined the patient. The following aspects are pertinent.  Past Medical History:  Diagnosis Date  . AICD (automatic cardioverter/defibrillator) present 08/2018   Medtronic ICD  . Arthritis    knee  . Chronic combined systolic and diastolic CHF (congestive heart failure) ([place])   . Coronary artery disease   . Deep vein thrombosis ([place]) 2007   left leg  . Diverticulitis   . Hepatitis    patient denies this dx  . Lupus anticoagulant positive   . Morbid obesity ([place])   . Morbid obesity with BMI of 50.0-59.9, adult ([place])   . Mural thrombus of heart    coumadin  . Nonischemic cardiomyopathy ([place])    a) 12/17/11 echo: LVEF 07-86%, grade 3 diastolic dysfunction (c/w restriction), mod MR, mod LA/LV and mild RA dilatation; b) 12/18/11 cMRI: LVEF 38%, mod LV/mild RV dilatation, global HK, mild-mod RV sys dysfxn, no LV thrombus & patchy non-subendocardial delayed enhancement c/w infil dz or prior myocarditis; c. 07/2018 EF 25-30%.  . Paroxysmal SVT (supraventricular tachycardia) ([place])   . Pneumonia    x 2  . Pulmonary embolism ([place])    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  . Wears glasses   . Wound of abdomen    Social History   Socioeconomic History  . Marital status: Divorced    Spouse name: Not on file  . Number of children: 5  . Years of education: Not on file  . Highest education level: Not on file  Occupational History  . Occupation: Systems developer for [employer]: [employer]  Tobacco Use  . Smoking status: Never Smoker  . Smokeless tobacco: Never Used  Vaping Use  . Vaping Use: Never used  Substance and Sexual Activity  . Alcohol use: Not Currently    Comment: Rarely  . Drug use: No  . Sexual activity: Not on file  Other Topics Concern  . Not on file  Social History Narrative   Lives in [place] 03/2015, 5 children.   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  Exercises with weights and some walking. 12/2015  Social Determinants of Health   Financial Resource Strain: Low Risk   . Difficulty of Paying Living Expenses: Not very hard  Food Insecurity: No Food Insecurity  . Worried About Charity fundraiser in the Last Year: Never true  . Ran Out of Food in the Last Year: Never true  Transportation Needs: No Transportation Needs  . Lack of Transportation (Medical): No  . Lack of Transportation (Non-Medical): No  Physical Activity: Not on file  Stress: Not on file  Social Connections: Not on file   Family History  Problem Relation Age of Onset  . Hypertension Mother   . Clotting disorder Mother        mom with PEs  . Cancer Mother        uterine cancer  . Diabetes Father   . Heart attack Father   . Hypertension Other        Sibling  . Diabetes Other   . Obesity Other   . Diabetes Sister   . Obesity Sister   . Obesity Sister   . Colon cancer Neg Hx   . Stomach cancer Neg Hx   . Esophageal cancer Neg Hx   . Rectal cancer Neg Hx   . Inflammatory bowel disease Neg Hx   . Liver disease Neg Hx   . Pancreatic cancer Neg Hx    Scheduled Meds: . apixaban  5 mg Oral BID  . carvedilol  3.125 mg Oral BID WC  . dapagliflozin propanediol  10 mg Oral Daily  . digoxin  0.125 mg Oral Daily  . mexiletine  300 mg Oral Q12H  . potassium chloride  40 mEq Oral Once  . potassium chloride SA  40 mEq Oral BID  . ranolazine  500 mg Oral BID  . sodium chloride flush  3 mL Intravenous Q12H  . spironolactone  50 mg  Oral Daily  . torsemide  60 mg Oral BID   Continuous Infusions: . sodium chloride    . amiodarone 30 mg/hr (06/06/20 0417)   PRN Meds:.sodium chloride, acetaminophen, diclofenac Sodium, ondansetron (ZOFRAN) IV, sodium chloride flush Allergies  Allergen Reactions  . Shrimp [Shellfish Allergy] Hives and Itching   Review of Systems  Constitutional: Positive for activity change, appetite change and fatigue.  Respiratory: Negative for shortness of breath.     Physical Exam Vitals and nursing note reviewed.  Constitutional:      General: He is not in acute distress.    Appearance: He is morbidly obese.  Cardiovascular:     Rate and Rhythm: Normal rate and regular rhythm.  Pulmonary:     Effort: No tachypnea, accessory muscle usage or respiratory distress.  Abdominal:     Palpations: Abdomen is soft.  Neurological:     Mental Status: He is alert and oriented to person, place, and time.     Vital Signs: BP 107/76   Pulse 72   Temp 98.1 F (36.7 C) (Oral)   Resp 18   Ht _0  (1.727 m)   Wt (!) 151.3 kg   SpO2 93%   BMI 50.72 kg/m  Pain Scale: 0-10 POSS *See Group Information*: 1-Acceptable,Awake and alert Pain Score: 0-No pain   SpO2: SpO2: 93 % O2 Device:SpO2: 93 % O2 Flow Rate: .   IO: Intake/output summary:   Intake/Output Summary (Last 24 hours) at 06/06/2020 1225 Last data filed at 06/06/2020 0844 Gross per 24 hour  Intake 1751.23 ml  Output 1635 ml  Net 116.23 ml  LBM: Last BM Date: 06/07/20 Baseline Weight: Weight: (!) 162.5 kg Most recent weight: Weight: (!) 151.3 kg     Palliative Assessment/Data:     Time In: 1230 Time Out: 1340 Time Total: 70 min Greater than 50%  of this time was spent counseling and coordinating care related to the above assessment and plan.  Signed by: Vinie Sill, NP Palliative Medicine Team Pager # [phone] (M-F 8a-5p) Team Phone # [phone] (Nights/Weekends)

## 2020-06-06 NOTE — Progress Notes (Addendum)
Progress Note  Patient Name: Roberto [name] Date of Encounter: 06/06/2020  [name] Cardiologist: Arvilla Meres, [name]   Subjective   no rest SOB, no CP., winded with minimal exertion, he was symptomatic with the NSVT/VT episodes w/lightheadedness and SOB  Inpatient Medications    Scheduled Meds: . apixaban  5 mg Oral BID  . carvedilol  3.125 mg Oral BID WC  . dapagliflozin propanediol  10 mg Oral Daily  . digoxin  0.125 mg Oral Daily  . mexiletine  300 mg Oral Q12H  . potassium chloride SA  40 mEq Oral BID  . ranolazine  500 mg Oral BID  . sodium chloride flush  3 mL Intravenous Q12H  . spironolactone  50 mg Oral Daily  . torsemide  60 mg Oral BID   Continuous Infusions: . sodium chloride    . amiodarone 30 mg/hr (06/06/20 0417)   PRN Meds: sodium chloride, acetaminophen, diclofenac Sodium, ondansetron (ZOFRAN) IV, sodium chloride flush   Vital Signs    Vitals:   06/05/20 1123 06/05/20 1706 06/05/20 2027 06/06/20 0417  BP: 116/74 108/70 108/81 116/76  Pulse:   73 73  Resp: 19 18 19 18   Temp: 98.1 F (36.7 C) 98.4 F (36.9 C) 98.1 F (36.7 C) 98.1 F (36.7 C)  TempSrc: Oral Oral Oral Oral  SpO2: 95% 95% 98% 93%  Weight:    (!) 151.3 kg  Height:        Intake/Output Summary (Last 24 hours) at 06/06/2020 0726 Last data filed at 06/06/2020 0418 Gross per 24 hour  Intake 2111.23 ml  Output 2410 ml  Net -298.77 ml   Last 3 Weights 06/06/2020 06/05/2020 06/04/2020  Weight (lbs) 333 lb 9.6 oz 330 lb 14.4 oz 340 lb  Weight (kg) 151.32 kg 150.095 kg 154.223 kg      Telemetry    SR, occ PVCs, had VT with successful ATP again yesterday Afternoon and a few shorter NSVT as well - Personally Reviewed  ECG    baseline artifact/motion, SR 71, IVCD, no clear acute looking changes - Personally Reviewed  Physical Exam   GEN: No acute distress.   Neck: No JVD Cardiac: RRR, no murmurs, rubs, or gallops.  Respiratory: CTA b/l. GI: Soft, nontender,  non-distended, R abd ostomy in place MS: no edema; No deformity. Neuro:  Nonfocal  Psych: Normal affect   Labs    High Sensitivity Troponin:   Recent Labs  Lab 05/24/20 0759 06/01/20 0909 06/01/20 1103  TROPONINIHS 29* 35* 31*      Chemistry Recent Labs  Lab 06/04/20 1408 06/05/20 0231 06/06/20 0102  NA 137 138 135  K 4.0 3.9 4.0  CL 101 99 98  CO2 27 28 28   GLUCOSE 135* 112* 133*  BUN 20 22* 23*  CREATININE 1.91* 2.11* 2.23*  CALCIUM 8.6* 9.1 8.9  GFRNONAA 41* 37* 34*  ANIONGAP 9 11 9      Hematology Recent Labs  Lab 06/01/20 0909  WBC 9.5  RBC 5.33  HGB 16.2  HCT 49.3  MCV 92.5  MCH 30.4  MCHC 32.9  RDW 13.7  PLT 160    BNP Recent Labs  Lab 06/01/20 0909  BNP 773.7*     DDimer No results for input(s): DDIMER in the last 168 hours.   Radiology    No results found.  Cardiac Studies    12/14/2019: TTE IMPRESSIONS   1. Left ventricular ejection fraction, by estimation, is 25 to 30%. The  left ventricle has  severely decreased function. Left ventricular  endocardial border not optimally defined to evaluate regional wall motion.  The left ventricular internal cavity size   was moderately dilated. There is mild left ventricular hypertrophy. Left  ventricular diastolic parameters are consistent with Grade III diastolic  dysfunction (restrictive).   2. Right ventricular systolic function is normal. The right ventricular  size is normal.   3. The mitral valve is normal in structure. No evidence of mitral valve  regurgitation. No evidence of mitral stenosis.   4. The aortic valve is normal in structure. Aortic valve regurgitation is  not visualized. No aortic stenosis is present.   5. The inferior vena cava is normal in size with greater than 50%  respiratory variability, suggesting right atrial pressure of 3 mmHg.   Patient Profile     55 y.o. 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   Admitted with syncope VT > ATP > VF > shock, again hypokalemic  Assessment & Plan    1. VT/VF Transitioned to PO amiodarone yesterday Had VT with ATP again yesterday afternoon as well as some NSVT  Back on amiodarone gtt and added ranexa to his regime as well as the mexiletine (Roberto [name] need to watch renal function)   2. Chronic Combined Systolic/Diastolic  Heart Failure  3. Hypokalemic Echo 12/21 EF 25-30% Not felt to bo overtly volume OL this admission K+ 4.0 today  Mag 1.9  SOB with minimal activity, requested O2 yesterday Lungs sound clear, not overtly/clearly volume OL by his exam Roberto [name] await HF team, suspect multifactorial, but more then his baseline    3. PAF S/P A Flutter Ablation 2018 CHA2DS2Vasc is 3, on Elquis   4. CKD  Stage IIIb Creatinine baseline 1.8-2 Creatinine today 2.23 Creat is up some aldactone was increased yesterday, Roberto [name] await HF team recs Follow BMET daily.    5. Obesity Body mass index is 54.47 kg/m.    6.  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 No plan for reversal of ileostomy after extensive abdominal hernia    For questions or updates, please contact [facility] Please consult [email or web address] for contact info under        Signed, Roberto Pigeon, PA-C  06/06/2020, 7:26 AM    I have seen and examined this patient with Roberto [name].  Agree with above, note added to reflect my findings.  On exam, RRR, no murmurs.   Patient continued to have episodes of VT yesterday.  Was transitioned back to IV amiodarone.  Roberto [name] continue amiodarone, mexiletine, Ranexa throughout the day today.  Roberto [name] switch back to p.o. amiodarone tomorrow.  His creatinine is elevated and he is somewhat short of breath.  Roberto [name] allow changes via heart failure cardiology.  [name] M. Laiklyn Pilkenton [name] 06/06/2020 11:06 AM

## 2020-06-07 DIAGNOSIS — I5042 Chronic combined systolic (congestive) and diastolic (congestive) heart failure: Secondary | ICD-10-CM | POA: Diagnosis not present

## 2020-06-07 DIAGNOSIS — I472 Ventricular tachycardia: Secondary | ICD-10-CM | POA: Diagnosis not present

## 2020-06-07 LAB — BASIC METABOLIC PANEL
Anion gap: 13 (ref 5–15)
BUN: 27 mg/dL — ABNORMAL HIGH (ref 6–20)
CO2: 23 mmol/L (ref 22–32)
Calcium: 8.6 mg/dL — ABNORMAL LOW (ref 8.9–10.3)
Chloride: 97 mmol/L — ABNORMAL LOW (ref 98–111)
Creatinine, Ser: 2.33 mg/dL — ABNORMAL HIGH (ref 0.61–1.24)
GFR, Estimated: 32 mL/min — ABNORMAL LOW (ref >60)
Glucose, Bld: 124 mg/dL — ABNORMAL HIGH (ref 70–99)
Potassium: 4.3 mmol/L (ref 3.5–5.1)
Sodium: 133 mmol/L — ABNORMAL LOW (ref 135–145)

## 2020-06-07 LAB — URIC ACID: Uric Acid, Serum: 12.1 mg/dL — ABNORMAL HIGH (ref 3.7–8.6)

## 2020-06-07 LAB — MAGNESIUM: Magnesium: 2.2 mg/dL (ref 1.7–2.4)

## 2020-06-07 MED ORDER — AMIODARONE HCL 200 MG PO TABS
400.0000 mg | ORAL_TABLET | Freq: Two times a day (BID) | ORAL | Status: DC
  Administered 2020-06-07 – 2020-06-10 (×7): 400 mg via ORAL
  Filled 2020-06-07 (×7): qty 2

## 2020-06-07 MED ORDER — COLCHICINE 0.6 MG PO TABS
0.6000 mg | ORAL_TABLET | Freq: Two times a day (BID) | ORAL | Status: DC
  Administered 2020-06-08: 0.6 mg via ORAL
  Filled 2020-06-07 (×2): qty 1

## 2020-06-07 MED ORDER — COLCHICINE 0.6 MG PO TABS
1.2000 mg | ORAL_TABLET | Freq: Once | ORAL | Status: AC
  Administered 2020-06-07: 1.2 mg via ORAL
  Filled 2020-06-07: qty 2

## 2020-06-07 MED ORDER — MEXILETINE HCL 150 MG PO CAPS
300.0000 mg | ORAL_CAPSULE | Freq: Three times a day (TID) | ORAL | Status: DC
  Administered 2020-06-07 – 2020-06-10 (×10): 300 mg via ORAL
  Filled 2020-06-07 (×12): qty 2

## 2020-06-07 MED ORDER — CARVEDILOL 6.25 MG PO TABS
6.2500 mg | ORAL_TABLET | Freq: Two times a day (BID) | ORAL | Status: DC
  Administered 2020-06-07 – 2020-06-10 (×6): 6.25 mg via ORAL
  Filled 2020-06-07 (×7): qty 1

## 2020-06-07 NOTE — Progress Notes (Addendum)
Advanced Heart Failure Rounding Note  PCP-Cardiologist: Glori Bickers, MD   Subjective:    5/24 in the afternoon had VT with successful ATP. No recurrence since. On ranolazine, amio, and mexiletine.   K 4.3  Mg 2.2   No dyspnea, feels tired today. Complaining of rt ankle pain c/w prior gout pain.   Palliative care now following for [place] discussion given end-staged HF. Currently remains full code.   Objective:   Weight Range: (!) 152 kg Body mass index is 50.95 kg/m.   Vital Signs:   Temp:  [98.1 F (36.7 C)] 98.1 F (36.7 C) (05/26 0456) Pulse Rate:  [72-77] 73 (05/26 0922) Resp:  [16] 16 (05/25 2325) BP: (111-124)/(70-82) 118/82 (05/26 0922) SpO2:  [100 %] 100 % (05/26 0456) Weight:  [152 kg] 152 kg (05/26 0456) Last BM Date: 06/06/20  Weight change: Filed Weights   06/05/20 0447 06/06/20 0417 06/07/20 0456  Weight: (!) 150.1 kg (!) 151.3 kg (!) 152 kg    Intake/Output:   Intake/Output Summary (Last 24 hours) at 06/07/2020 1004 Last data filed at 06/07/2020 0457 Gross per 24 hour  Intake 420 ml  Output 2625 ml  Net -2205 ml      Physical Exam   General:  Well appearing, super morbidly obese. No respiratory difficulty HEENT: normal Neck: supple. Thick neck JVD not well visualized. Carotids 2+ bilat; no bruits. No lymphadenopathy or thyromegaly appreciated. Cor: PMI nondisplaced. Regular rate & rhythm. No rubs, gallops or murmurs. Lungs: clear Abdomen: soft, nontender, nondistended. No hepatosplenomegaly. No bruits or masses. Good bowel sounds. Extremities: no cyanosis, clubbing, rash, edema Neuro: alert & oriented x 3, cranial nerves grossly intact. moves all 4 extremities w/o difficulty. Affect pleasant.   Telemetry   NSR w/ occasional PVCs. No further VT   Labs    CBC No results for input(s): WBC, NEUTROABS, HGB, HCT, MCV, PLT in the last 72 hours. Basic Metabolic Panel Recent Labs    06/06/20 0102 06/07/20 0058  NA 135 133*  K 4.0  4.3  CL 98 97*  CO2 28 23  GLUCOSE 133* 124*  BUN 23* 27*  CREATININE 2.23* 2.33*  CALCIUM 8.9 8.6*  MG 1.9 2.2   Liver Function Tests No results for input(s): AST, ALT, ALKPHOS, BILITOT, PROT, ALBUMIN in the last 72 hours. No results for input(s): LIPASE, AMYLASE in the last 72 hours. Cardiac Enzymes No results for input(s): CKTOTAL, CKMB, CKMBINDEX, TROPONINI in the last 72 hours.  BNP: BNP (last 3 results) Recent Labs    04/13/20 1126 05/24/20 0759 06/01/20 0909  BNP 917.0* 583.6* 773.7*    ProBNP (last 3 results) No results for input(s): PROBNP in the last 8760 hours.   D-Dimer No results for input(s): DDIMER in the last 72 hours. Hemoglobin A1C No results for input(s): HGBA1C in the last 72 hours. Fasting Lipid Panel No results for input(s): CHOL, HDL, LDLCALC, TRIG, CHOLHDL, LDLDIRECT in the last 72 hours. Thyroid Function Tests No results for input(s): TSH, T4TOTAL, T3FREE, THYROIDAB in the last 72 hours.  Invalid input(s): FREET3  Other results:   Imaging    No results found.   Medications:     Scheduled Medications: . amiodarone  400 mg Oral BID  . apixaban  5 mg Oral BID  . carvedilol  6.25 mg Oral BID WC  . dapagliflozin propanediol  10 mg Oral Daily  . digoxin  0.125 mg Oral Daily  . mexiletine  300 mg Oral TID  . potassium  chloride SA  40 mEq Oral BID  . ranolazine  500 mg Oral BID  . sodium chloride flush  3 mL Intravenous Q12H  . spironolactone  50 mg Oral Daily  . torsemide  60 mg Oral BID    Infusions: . sodium chloride      PRN Medications: sodium chloride, acetaminophen, diclofenac Sodium, ondansetron (ZOFRAN) IV, sodium chloride flush     Assessment/Plan  1. VF/VT -Shock x1.  - Had recurrent VT 5/24 with ATP. No further recurrence   - On PO amio + mexilitene + ranolazine.  - Keep K > 4.0 and Mg > 2.0 (stable today)  - Not a candidate for VT ablation due to BMI  2. Chronic Combined Systolic/Diastolic  Heart  Failure  - Echo 12/21 EF 25-30% -NYHA III. Volume status ok. Remains short of breath with minimal exertion.  - Continue torsemide 60 mg bid  - Continue spiro to 50 daily - Continue farxiga 10 mg daily - he is nearing end-stage. Not candidate for VAD with CKD. Large hernia and ostomy  - Palliative care following for [place] discussions   3. PAF - S/P A Flutter Ablation 2018 - On po amio   4. CKD  Stage IIIb -Creatinine baseline 1.8-2 -Creatinine 2.1>2.23>2.33 -Suspect cardiorenal. Not candidate for advanced HF therapies   5. Obesity Body mass index is 50.95 kg/m.  6.  H/o Acute diverticulitis with colon resection and ileostomy - S/p skin grafting 09/05/19. -No plan for reversal of ileostomy after extensive abdominal hernia  7. Rt Ankle pain - suspect gout, check uric acid. Treat w/ prednisone if elevated     Length of Stay: [address] PA-C  06/07/2020, 10:04 AM  Advanced Heart Failure Team Pager [phone] (M-F; 7a - 5p)  Please contact [facility] for night-coverage after hours (5p -7a ) and weekends on [email or web address]  Patient seen and examined with the above-signed Advanced Practice Provider and/or Housestaff. I personally reviewed laboratory data, imaging studies and relevant notes. I independently examined the patient and formulated the important aspects of the plan. I have edited the note to reflect any of my changes or salient points. I have personally discussed the plan with the patient and/or family.  No further VT on higher dose mexillitene. Denies SOB at rest but fatigues easily. Having gout flare in R ankle. Met with palliative care team yesterday.   General:  Lying in bed. No resp difficulty HEENT: normal Neck: supple. no obvious JVD  Carotids 2+ bilat; no bruits. No lymphadenopathy or thryomegaly appreciated. Cor: PMI nondisplaced. Regular rate & rhythm. No rubs, gallops or murmurs. Lungs: clear Abdomen: obese + hernia and ostomy soft, nontender,  nondistended. No hepatosplenomegaly. No bruits or masses. Good bowel sounds. Extremities: no cyanosis, clubbing, rash, edema  R ankle sore Neuro: alert & orientedx3, cranial nerves grossly intact. moves all 4 extremities w/o difficulty. Affect pleasant  VT seems to be better suppressed. He met with Palliative Care yesterday. We discussed how advanced his HF is and that he is not candidate for advanced therapies despite his young age. Agree with prednisone for gout flare. Continue current HF regimen. If stabilizes would he be candidate for [employer] or Anthem?  Glori Bickers, MD  10:00 PM

## 2020-06-07 NOTE — Plan of Care (Signed)
  Problem: Activity: Goal: Risk for activity intolerance will decrease Outcome: Progressing   Problem: Safety: Goal: Ability to remain free from injury will improve Outcome: Progressing   Problem: Skin Integrity: Goal: Risk for impaired skin integrity will decrease Outcome: Progressing   

## 2020-06-07 NOTE — Progress Notes (Addendum)
Progress Note  Patient Name: Roberto [name] Date of Encounter: 06/07/2020  [facility] Cardiologist: Arvilla Meres, MD   Subjective   No changes, denies rest SOB, no CP., winded with minimal exertion  Inpatient Medications    Scheduled Meds: . apixaban  5 mg Oral BID  . carvedilol  3.125 mg Oral BID WC  . dapagliflozin propanediol  10 mg Oral Daily  . digoxin  0.125 mg Oral Daily  . mexiletine  300 mg Oral Q12H  . potassium chloride SA  40 mEq Oral BID  . ranolazine  500 mg Oral BID  . sodium chloride flush  3 mL Intravenous Q12H  . spironolactone  50 mg Oral Daily  . torsemide  60 mg Oral BID   Continuous Infusions: . sodium chloride    . amiodarone 30 mg/hr (06/07/20 0218)   PRN Meds: sodium chloride, acetaminophen, diclofenac Sodium, ondansetron (ZOFRAN) IV, sodium chloride flush   Vital Signs    Vitals:   06/06/20 1026 06/06/20 1750 06/06/20 2325 06/07/20 0456  BP:  118/78 111/70 124/79  Pulse: 72 72 75 77  Resp:   16   Temp:   98.1 F (36.7 C) 98.1 F (36.7 C)  TempSrc:   Oral Oral  SpO2:   100% 100%  Weight:    (!) 152 kg  Height:        Intake/Output Summary (Last 24 hours) at 06/07/2020 0723 Last data filed at 06/07/2020 0457 Gross per 24 hour  Intake 600 ml  Output 2950 ml  Net -2350 ml   Last 3 Weights 06/07/2020 06/06/2020 06/05/2020  Weight (lbs) 335 lb 1.6 oz 333 lb 9.6 oz 330 lb 14.4 oz  Weight (kg) 152 kg 151.32 kg 150.095 kg      Telemetry    SR, occ PVCs, no VT since yesterday AM- Personally Reviewed  ECG    No new EKGs - Personally Reviewed  Physical Exam   GEN: No acute distress.   Neck: No JVD Cardiac: RRR, no murmurs, rubs, or gallops.  Respiratory: CTA b/l. GI: Soft, nontender, non-distended, R abd ostomy in place MS: no edema; No deformity. Neuro:  Nonfocal  Psych: Normal affect   Labs    High Sensitivity Troponin:   Recent Labs  Lab 05/24/20 0759 06/01/20 0909 06/01/20 1103  TROPONINIHS 29* 35* 31*       Chemistry Recent Labs  Lab 06/05/20 0231 06/06/20 0102 06/07/20 0058  NA 138 135 133*  K 3.9 4.0 4.3  CL 99 98 97*  CO2 28 28 23   GLUCOSE 112* 133* 124*  BUN 22* 23* 27*  CREATININE 2.11* 2.23* 2.33*  CALCIUM 9.1 8.9 8.6*  GFRNONAA 37* 34* 32*  ANIONGAP 11 9 13      Hematology Recent Labs  Lab 06/01/20 0909  WBC 9.5  RBC 5.33  HGB 16.2  HCT 49.3  MCV 92.5  MCH 30.4  MCHC 32.9  RDW 13.7  PLT 160    BNP Recent Labs  Lab 06/01/20 0909  BNP 773.7*     DDimer No results for input(s): DDIMER in the last 168 hours.   Radiology    No results found.  Cardiac Studies    12/14/2019: TTE IMPRESSIONS   1. Left ventricular ejection fraction, by estimation, is 25 to 30%. The  left ventricle has severely decreased function. Left ventricular  endocardial border not optimally defined to evaluate regional wall motion.  The left ventricular internal cavity size   was moderately dilated. There is  mild left ventricular hypertrophy. Left  ventricular diastolic parameters are consistent with Grade III diastolic  dysfunction (restrictive).   2. Right ventricular systolic function is normal. The right ventricular  size is normal.   3. The mitral valve is normal in structure. No evidence of mitral valve  regurgitation. No evidence of mitral stenosis.   4. The aortic valve is normal in structure. Aortic valve regurgitation is  not visualized. No aortic stenosis is present.   5. The inferior vena cava is normal in size with greater than 50%  respiratory variability, suggesting right atrial pressure of 3 mmHg.   Patient Profile     55 y.o. 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   Admitted with syncope VT > ATP > VF > shock, again hypokalemic  Assessment & Plan    1. VT/VF Transitioned to PO amiodarone  yesterday Had VT with ATP Tuesday, none since.   NSVT yesterday AM is the last, no ATP since Tuesday afternoon  Transition to PO amiodarone, increase mexiletine to TID, BP looks like could tolerate more coreg.  Dr. Elberta Fortis has seen the patient this AM, disussed if more VT may need to think about a referral for ablation.   2. Chronic Combined Systolic/Diastolic  Heart Failure  3. Hypokalemic Echo 12/21 EF 25-30% Not felt to bo overtly volume OL this admission K+ 4.3 today  Mag 2.2   SOB with minimal activity Lungs sound clear, not overtly/clearly volume OL by his exam C/w HF team    3. PAF S/P A Flutter Ablation 2018 CHA2DS2Vasc is 3, on Elquis   4. CKD  Stage IIIb Creatinine baseline 1.8-2 Creatinine up a bit again today 2.33 Creat is up some aldactone was increased [name] defer to HF team recs Follow BMET daily.    5. Obesity Body mass index is 54.47 kg/m.    6.  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 No plan for reversal of ileostomy after extensive abdominal hernia    For questions or updates, please contact [facility] Please consult [email or web address] for contact info under        Signed, Sheilah Pigeon, PA-C  06/07/2020, 7:23 AM    I have seen and examined this patient with [name].  Agree with above, note added to reflect my findings.  On exam, RRR, no murmurs.  Since restarting IV amiodarone, no further VT has occurred.  He continues to be short of breath with minimal exertion and remains on oxygen.  We Liliahna Cudd switch him back to p.o. amiodarone today.  We Tiyona Desouza also increase mexiletine to 3 times daily.  If he has a GI upset, may need to decrease the mexiletine.  The goal would be to get him out of the hospital on these medications and have him follow-up in clinic.  We Keiandre Cygan watch him throughout the day today and overnight, with hopefully discharge tomorrow.  Belma Dyches M. Kemari Narez MD 06/07/2020 10:07 AM

## 2020-06-08 DIAGNOSIS — I5042 Chronic combined systolic (congestive) and diastolic (congestive) heart failure: Secondary | ICD-10-CM | POA: Diagnosis not present

## 2020-06-08 DIAGNOSIS — I472 Ventricular tachycardia: Secondary | ICD-10-CM | POA: Diagnosis not present

## 2020-06-08 DIAGNOSIS — Z7189 Other specified counseling: Secondary | ICD-10-CM | POA: Diagnosis not present

## 2020-06-08 DIAGNOSIS — Z515 Encounter for palliative care: Secondary | ICD-10-CM | POA: Diagnosis not present

## 2020-06-08 LAB — BASIC METABOLIC PANEL
Anion gap: 12 (ref 5–15)
BUN: 37 mg/dL — ABNORMAL HIGH (ref 6–20)
CO2: 26 mmol/L (ref 22–32)
Calcium: 8.6 mg/dL — ABNORMAL LOW (ref 8.9–10.3)
Chloride: 97 mmol/L — ABNORMAL LOW (ref 98–111)
Creatinine, Ser: 2.68 mg/dL — ABNORMAL HIGH (ref 0.61–1.24)
GFR, Estimated: 27 mL/min — ABNORMAL LOW (ref >60)
Glucose, Bld: 130 mg/dL — ABNORMAL HIGH (ref 70–99)
Potassium: 4.9 mmol/L (ref 3.5–5.1)
Sodium: 135 mmol/L (ref 135–145)

## 2020-06-08 LAB — MAGNESIUM: Magnesium: 2.5 mg/dL — ABNORMAL HIGH (ref 1.7–2.4)

## 2020-06-08 MED ORDER — ALUM & MAG HYDROXIDE-SIMETH 200-200-20 MG/5ML PO SUSP
30.0000 mL | ORAL | Status: DC | PRN
  Administered 2020-06-08: 30 mL via ORAL
  Filled 2020-06-08: qty 30

## 2020-06-08 MED ORDER — POTASSIUM CHLORIDE CRYS ER 20 MEQ PO TBCR
40.0000 meq | EXTENDED_RELEASE_TABLET | Freq: Every day | ORAL | Status: DC
  Administered 2020-06-09 – 2020-06-10 (×2): 40 meq via ORAL
  Filled 2020-06-08 (×2): qty 2

## 2020-06-08 MED ORDER — TORSEMIDE 20 MG PO TABS
60.0000 mg | ORAL_TABLET | Freq: Every day | ORAL | Status: DC
  Administered 2020-06-09 – 2020-06-10 (×2): 60 mg via ORAL
  Filled 2020-06-08 (×2): qty 3

## 2020-06-08 MED ORDER — TORSEMIDE 20 MG PO TABS
40.0000 mg | ORAL_TABLET | Freq: Every day | ORAL | Status: DC
  Administered 2020-06-08: 40 mg via ORAL
  Filled 2020-06-08: qty 2

## 2020-06-08 MED ORDER — PREDNISONE 20 MG PO TABS: 40.0000 mg | ORAL_TABLET | Freq: Every day | ORAL | Status: DC

## 2020-06-08 MED ORDER — PREDNISONE 20 MG PO TABS
40.0000 mg | ORAL_TABLET | Freq: Every day | ORAL | Status: AC
  Administered 2020-06-08 – 2020-06-10 (×3): 40 mg via ORAL
  Filled 2020-06-08 (×3): qty 2

## 2020-06-08 NOTE — Progress Notes (Signed)
Palliative Medicine Inpatient Follow Up Note  HPI: 55 y.o. 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 Hospitalization complicated by ongoing VT with EP and heart failure teams following for recommendations. Unfortunately his options are very limited. Not a candidate for ablation due to BMI or advanced therapies due to CKD, hernia, ostomy.   Today's Discussion (06/08/2020):  *Please note that this is a verbal dictation therefore any spelling or grammatical errors are due to the "[place]" system interpretation.  I met with [name] at bedside this morning.  Summarize conversations he had had with my colleague Elmo Putt 2 days prior.  Discussed patient's  nonischemic cardiomyopathy, ventricular tachycardia, and progressing CKD.  Reviewed that his options are limited and he is not a candidate for advanced therapies.  Gussie shares with me that he understands this and has been "speaking to family members" about next steps.  Reviewed the role of advance directives and the importance of them in terms of designating a healthcare power of attorney and also creating a living will.  Yoltzin shares with me that he would be interested in pursuing this later on in the day.  I shared with him that our chaplain Dorian Pod will be able to further assist with this.  [name] and I  further discussed the importance of other considering his present CODE STATUS.  In the setting of his underlying disease processes cardiopulmonary resuscitation would likely result in devastating consequences with little to no benefit.  Ahaan shares that he would like to think more about this.  I told Welden I will follow-up with him in the oncoming days to further solidify his thoughts.  From  a symptom perspective Courtenay complains of some abdominal discomfort.  I was able to notify his nurse of this which has prior been suspected to be related to new medications [place] has been instituted on.  Questions and concerns addressed   Objective Assessment: Vital Signs Vitals:   06/07/20 2044 06/08/20 0525  BP: 106/83 111/69  Pulse: 100 74  Resp: 20 17  Temp: 98.3 F (36.8 C) 98.1 F (36.7 C)  SpO2:      Intake/Output Summary (Last 24 hours) at 06/08/2020 6962 Last data filed at 06/08/2020 0600 Gross per 24 hour  Intake 960 ml  Output 1900 ml  Net -940 ml   Last Weight  Most recent update: 06/08/2020  5:26 AM   Weight  151.3 kg (333 lb 9.6 oz)             Gen:  Heavy set M in NAD HEENT: moist mucous membranes CV: Irregular rate and regular rhythm  PULM: clear to auscultation bilaterally  ABD: soft/nontender  EXT: (+) Neuro: Alert and oriented x3  SUMMARY OF RECOMMENDATIONS   Full Code - This is an ongoing conversation  Plan to complete a MOST prior to discharge  Appreciate Spiritual team helping to complete advance directives  Will request OP Palliative support  Time Spent: 35 Greater than 50% of the time was spent in counseling and coordination of care ______________________________________________________________________________________ [place] Team Team Cell Phone: [phone] Please utilize secure chat with additional questions, if there is no response within 30 minutes please call the above phone number  Palliative Medicine Team providers are available  by phone from 7am to 7pm daily and can be reached through the team cell phone.  Should this patient require assistance outside of these hours, please call the patient's attending physician.

## 2020-06-08 NOTE — Progress Notes (Addendum)
CSW spoke with patient at bedside to bring them an appointment card for the HV outpatient clinic and encouraged them to follow up and to attend the appointment and bring their medications and if anything changes to please reach out so that CSW/HV clinic team can provide support.  TOC will continue to follow for discharge needs.  Gareth Fitzner, MSW, LCSWA [phone] Heart Failure Social Worker 

## 2020-06-08 NOTE — Progress Notes (Addendum)
Progress Note  Patient Name: Roberto [name] Date of Encounter: 06/08/2020  [facility] Cardiologist: Arvilla Meres, MD   Subjective   No changes, denies rest SOB, no CP., winded with minimal exertion  Inpatient Medications    Scheduled Meds: . amiodarone  400 mg Oral BID  . apixaban  5 mg Oral BID  . carvedilol  6.25 mg Oral BID WC  . dapagliflozin propanediol  10 mg Oral Daily  . digoxin  0.125 mg Oral Daily  . mexiletine  300 mg Oral TID  . [START ON 06/09/2020] potassium chloride SA  40 mEq Oral Daily  . [START ON 06/09/2020] predniSONE  40 mg Oral Q breakfast  . ranolazine  500 mg Oral BID  . sodium chloride flush  3 mL Intravenous Q12H  . spironolactone  50 mg Oral Daily  . torsemide  60 mg Oral BID   Continuous Infusions: . sodium chloride     PRN Meds: sodium chloride, acetaminophen, diclofenac Sodium, ondansetron (ZOFRAN) IV, sodium chloride flush   Vital Signs    Vitals:   06/07/20 1716 06/07/20 2044 06/08/20 0525 06/08/20 0844  BP: 102/78 106/83 111/69 114/73  Pulse:  100 74 75  Resp: 18 20 17    Temp: 98.5 F (36.9 C) 98.3 F (36.8 C) 98.1 F (36.7 C)   TempSrc: Oral Oral Oral   SpO2:      Weight:   (!) 151.3 kg   Height:        Intake/Output Summary (Last 24 hours) at 06/08/2020 0935 Last data filed at 06/08/2020 0600 Gross per 24 hour  Intake 960 ml  Output 1900 ml  Net -940 ml   Last 3 Weights 06/08/2020 06/07/2020 06/06/2020  Weight (lbs) 333 lb 9.6 oz 335 lb 1.6 oz 333 lb 9.6 oz  Weight (kg) 151.32 kg 152 kg 151.32 kg      Telemetry    SR, occ PVCs, no VT since Wed AM- Personally Reviewed  ECG    No new EKGs - Personally Reviewed  Physical Exam   GEN: No acute distress.   Neck: No JVD Cardiac: RRR, no murmurs, rubs, or gallops.  Respiratory: CTA b/l. GI: Soft, nontender, non-distended, R abd ostomy in place MS: no edema; No deformity. Neuro:  Nonfocal  Psych: Normal affect   Labs    High Sensitivity Troponin:    Recent Labs  Lab 05/24/20 0759 06/01/20 0909 06/01/20 1103  TROPONINIHS 29* 35* 31*      Chemistry Recent Labs  Lab 06/06/20 0102 06/07/20 0058 06/08/20 0722  NA 135 133* 135  K 4.0 4.3 4.9  CL 98 97* 97*  CO2 28 23 26   GLUCOSE 133* 124* 130*  BUN 23* 27* 37*  CREATININE 2.23* 2.33* 2.68*  CALCIUM 8.9 8.6* 8.6*  GFRNONAA 34* 32* 27*  ANIONGAP 9 13 12      Hematology No results for input(s): WBC, RBC, HGB, HCT, MCV, MCH, MCHC, RDW, PLT in the last 168 hours.  BNP No results for input(s): BNP, PROBNP in the last 168 hours.   DDimer No results for input(s): DDIMER in the last 168 hours.   Radiology    No results found.  Cardiac Studies    12/14/2019: TTE IMPRESSIONS   1. Left ventricular ejection fraction, by estimation, is 25 to 30%. The  left ventricle has severely decreased function. Left ventricular  endocardial border not optimally defined to evaluate regional wall motion.  The left ventricular internal cavity size   was moderately dilated.  There is mild left ventricular hypertrophy. Left  ventricular diastolic parameters are consistent with Grade III diastolic  dysfunction (restrictive).   2. Right ventricular systolic function is normal. The right ventricular  size is normal.   3. The mitral valve is normal in structure. No evidence of mitral valve  regurgitation. No evidence of mitral stenosis.   4. The aortic valve is normal in structure. Aortic valve regurgitation is  not visualized. No aortic stenosis is present.   5. The inferior vena cava is normal in size with greater than 50%  respiratory variability, suggesting right atrial pressure of 3 mmHg.   Patient Profile     55 y.o. 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   Admitted with syncope VT > ATP > VF > shock, again  hypokalemic  Assessment & Plan    1. VT/VF Transitioned to PO amiodarone yesterday Had VT with ATP Tuesday, none since.   NSVT yesterday AM is the last, no ATP since Tuesday afternoon  Yesterday Transitioned to PO amiodarone, increased mexiletine to TID, and coreg Creat on the rise, may need tos top Ranexa   2. Chronic Combined Systolic/Diastolic  Heart Failure  3. Hypokalemic Echo 12/21 EF 25-30% Not felt to bo overtly volume OL this admission K+ 4.9 Mag 2.5 Creat 2.68 Creat continues to trend upwards > d/w HF APP, they will review meds and see him, though no discharge with rising Creat  Still winded with ambulating to the BR   3. PAF S/P A Flutter Ablation 2018 CHA2DS2Vasc is 3, on Elquis    4. CKD  Stage IIIb Creatinine baseline 1.8-2 Creatinine up again today 2.68 As above, await HF team recommendations for his meds May need to stop dig    5. Obesity Body mass index is 54.47 kg/m.    6.  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 No plan for reversal of ileostomy after extensive abdominal hernia  7. Gout     Colchicine made his nauseous yesterday     With rising creat, stopped > prednisone     Heel feels a bit better    For questions or updates, please contact [facility] Please consult [email or web address] for contact info under        Signed, Sheilah Pigeon, PA-C  06/08/2020, 9:35 AM    I have seen, examined the patient, and reviewed the above assessment and plan.  Changes to above are made where necessary.  On exam, RRR.  Arrhythmia is stable.  His prognosis is poor.   Continue current AAD therapy.  The patient is interested in outpatient elective evaluation by Dr Alfonso Ellis for consideration of high risk ablation.  I have discussed this option with Dr Elberta Fortis also.  Dr Graciela Husbands to follow this weekend.  Co Sign: Hillis Range, MD 06/08/2020 4:23 PM
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
## 2020-06-08 NOTE — Progress Notes (Addendum)
This chaplain responded to PMT consult for creating or updating an Advance Directive.  The Pt. is awake and alert, participating in AD education.   The Pt. chooses to fill out HCPOA today. The Pt is choosing his son-Roberto [name] as his healthcare agent. The Pt. will not complete the Living Will. The AD document is in the Pt. chart waiting on a notary.  Notaries will return to the hospital on Tuesday, at that time spiritual care will plan a F/U visit for notarizing the Pt. AD.

## 2020-06-09 DIAGNOSIS — N179 Acute kidney failure, unspecified: Secondary | ICD-10-CM | POA: Diagnosis not present

## 2020-06-09 DIAGNOSIS — N183 Chronic kidney disease, stage 3 unspecified: Secondary | ICD-10-CM

## 2020-06-09 DIAGNOSIS — I472 Ventricular tachycardia: Secondary | ICD-10-CM | POA: Diagnosis not present

## 2020-06-09 LAB — BASIC METABOLIC PANEL
Anion gap: 13 (ref 5–15)
BUN: 49 mg/dL — ABNORMAL HIGH (ref 6–20)
CO2: 25 mmol/L (ref 22–32)
Calcium: 8.8 mg/dL — ABNORMAL LOW (ref 8.9–10.3)
Chloride: 98 mmol/L (ref 98–111)
Creatinine, Ser: 2.75 mg/dL — ABNORMAL HIGH (ref 0.61–1.24)
GFR, Estimated: 27 mL/min — ABNORMAL LOW (ref >60)
Glucose, Bld: 139 mg/dL — ABNORMAL HIGH (ref 70–99)
Potassium: 4.8 mmol/L (ref 3.5–5.1)
Sodium: 136 mmol/L (ref 135–145)

## 2020-06-09 LAB — MAGNESIUM: Magnesium: 2.6 mg/dL — ABNORMAL HIGH (ref 1.7–2.4)

## 2020-06-09 LAB — DIGOXIN LEVEL: Digoxin Level: 0.5 ng/mL — ABNORMAL LOW (ref 0.8–2.0)

## 2020-06-09 NOTE — Progress Notes (Signed)
Progress Note  Patient Name: Roberto [name] Date of Encounter: 06/09/2020  [facility] Cardiologist: Arvilla Meres, MD   Patient Profile     55 y.o. 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   Admitted with syncope VT > ATP > VF > shock, again hypokalemic  Subjective   Less sob  Feels good  No nausea Gout better and not limiting ambulation   Inpatient Medications    Scheduled Meds: . amiodarone  400 mg Oral BID  . apixaban  5 mg Oral BID  . carvedilol  6.25 mg Oral BID WC  . dapagliflozin propanediol  10 mg Oral Daily  . digoxin  0.125 mg Oral Daily  . mexiletine  300 mg Oral TID  . potassium chloride SA  40 mEq Oral Daily  . predniSONE  40 mg Oral Q breakfast  . ranolazine  500 mg Oral BID  . sodium chloride flush  3 mL Intravenous Q12H  . spironolactone  50 mg Oral Daily  . torsemide  40 mg Oral q1800  . torsemide  60 mg Oral QAC breakfast   Continuous Infusions: . sodium chloride     PRN Meds: sodium chloride, acetaminophen, alum & mag hydroxide-simeth, diclofenac Sodium, ondansetron (ZOFRAN) IV, sodium chloride flush   Vital Signs    Vitals:   06/08/20 1300 06/08/20 2027 06/09/20 0452 06/09/20 0810  BP: 116/80 111/74 121/73 111/69  Pulse: 72 65 70 67  Resp: 18 19 19 18   Temp: 98.4 F (36.9 C) 98.4 F (36.9 C) 98.4 F (36.9 C) 97.7 F (36.5 C)  TempSrc: Oral Oral Oral Oral  SpO2: 90% 90% 90% 94%  Weight:   (!) 150.2 kg   Height:        Intake/Output Summary (Last 24 hours) at 06/09/2020 1152 Last data filed at 06/09/2020 0800 Gross per 24 hour  Intake 720 ml  Output 550 ml  Net 170 ml   Last 3 Weights 06/09/2020 06/08/2020 06/07/2020  Weight (lbs) 331 lb 3.2 oz 333 lb 9.6 oz 335 lb 1.6 oz  Weight (kg) 150.231 kg 151.32 kg 152 kg      Telemetry    SR no VT Personally reviewed    ECG     No new EKGs - Personally Reviewed  Physical Exam    Well developed and Morbidly obese in no acute distress asleep on my enterin HENT normal Neck supple  Clear Regular rate and rhythm, no  murmur Abd-soft with active BS No Clubbing cyanosis  edema Skin-warm and dry A & Oriented  Grossly normal sensory and motor function    Labs    High Sensitivity Troponin:   Recent Labs  Lab 05/24/20 0759 06/01/20 0909 06/01/20 1103  TROPONINIHS 29* 35* 31*      Chemistry Recent Labs  Lab 06/07/20 0058 06/08/20 0722 06/09/20 0134  NA 133* 135 136  K 4.3 4.9 4.8  CL 97* 97* 98  CO2 23 26 25   GLUCOSE 124* 130* 139*  BUN 27* 37* 49*  CREATININE 2.33* 2.68* 2.75*  CALCIUM 8.6* 8.6* 8.8*  GFRNONAA 32* 27* 27*  ANIONGAP 13 12 13      Hematology No results for input(s): WBC, RBC, HGB, HCT, MCV, MCH, MCHC, RDW, PLT in the last 168 hours.  BNP No results for input(s): BNP, PROBNP in the last 168 hours.   DDimer  No results for input(s): DDIMER in the last 168 hours.   Radiology    No results found.  Cardiac Studies    12/14/2019: TTE IMPRESSIONS   1. Left ventricular ejection fraction, by estimation, is 25 to 30%. The  left ventricle has severely decreased function. Left ventricular  endocardial border not optimally defined to evaluate regional wall motion.  The left ventricular internal cavity size   was moderately dilated. There is mild left ventricular hypertrophy. Left  ventricular diastolic parameters are consistent with Grade III diastolic  dysfunction (restrictive).   2. Right ventricular systolic function is normal. The right ventricular  size is normal.   3. The mitral valve is normal in structure. No evidence of mitral valve  regurgitation. No evidence of mitral stenosis.   4. The aortic valve is normal in structure. Aortic valve regurgitation is  not visualized. No aortic stenosis is present.   5. The inferior vena cava is normal in size with greater than  50%  respiratory variability, suggesting right atrial pressure of 3 mmHg.     Assessment & Plan    Syncopal VT VF  Amiod Mex and Ranolazine  CHF A/C systolic  Hypokalemia  A Renal Injury Gd 3  AFib/FLutter with remote Aflu ablation  Gout   Holding sinus on PO amio, mex, ranol  With increasing Cr, will hold dig, and decrease diuretics to daily Volume status looks euvolemic  GI ok tolerating mex     Suspect OSA and would benefit from sleep study and also dietary counseling  K normal continue aldactone and will need to follow as diuretic doses decreased      Co Sign: Sherryl Manges, MD 06/09/2020 11:52 AM

## 2020-06-10 ENCOUNTER — Encounter (HOSPITAL_COMMUNITY): Payer: Self-pay | Admitting: Internal Medicine

## 2020-06-10 DIAGNOSIS — N179 Acute kidney failure, unspecified: Secondary | ICD-10-CM

## 2020-06-10 DIAGNOSIS — I472 Ventricular tachycardia: Secondary | ICD-10-CM | POA: Diagnosis not present

## 2020-06-10 DIAGNOSIS — Z7189 Other specified counseling: Secondary | ICD-10-CM | POA: Diagnosis not present

## 2020-06-10 DIAGNOSIS — N189 Chronic kidney disease, unspecified: Secondary | ICD-10-CM

## 2020-06-10 DIAGNOSIS — Z515 Encounter for palliative care: Secondary | ICD-10-CM | POA: Diagnosis not present

## 2020-06-10 LAB — BASIC METABOLIC PANEL
Anion gap: 10 (ref 5–15)
BUN: 63 mg/dL — ABNORMAL HIGH (ref 6–20)
CO2: 23 mmol/L (ref 22–32)
Calcium: 8.5 mg/dL — ABNORMAL LOW (ref 8.9–10.3)
Chloride: 101 mmol/L (ref 98–111)
Creatinine, Ser: 2.72 mg/dL — ABNORMAL HIGH (ref 0.61–1.24)
GFR, Estimated: 27 mL/min — ABNORMAL LOW (ref >60)
Glucose, Bld: 133 mg/dL — ABNORMAL HIGH (ref 70–99)
Potassium: 4.2 mmol/L (ref 3.5–5.1)
Sodium: 134 mmol/L — ABNORMAL LOW (ref 135–145)

## 2020-06-10 LAB — TSH: TSH: 1.34 u[IU]/mL (ref 0.350–4.500)

## 2020-06-10 LAB — MAGNESIUM: Magnesium: 2.6 mg/dL — ABNORMAL HIGH (ref 1.7–2.4)

## 2020-06-10 MED ORDER — RANOLAZINE ER 500 MG PO TB12: 500.0000 mg | ORAL_TABLET | Freq: Two times a day (BID) | ORAL | 3 refills | Status: AC

## 2020-06-10 MED ORDER — MEXILETINE HCL 150 MG PO CAPS: 300.0000 mg | ORAL_CAPSULE | Freq: Two times a day (BID) | ORAL | 3 refills | Status: AC

## 2020-06-10 MED ORDER — CARVEDILOL 6.25 MG PO TABS: 6.2500 mg | ORAL_TABLET | Freq: Two times a day (BID) | ORAL | 3 refills | Status: AC

## 2020-06-10 MED ORDER — AMIODARONE HCL 200 MG PO TABS: ORAL_TABLET | ORAL | 0 refills | Status: DC

## 2020-06-10 MED ORDER — SPIRONOLACTONE 25 MG PO TABS: 50.0000 mg | ORAL_TABLET | Freq: Every day | ORAL | 3 refills | Status: AC
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
## 2020-06-10 NOTE — Progress Notes (Signed)
   Palliative Medicine Inpatient Follow Up Note  HPI: 55 y.o. 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 Hospitalization complicated by ongoing VT with EP and heart failure teams following for recommendations. Unfortunately his options are very limited. Not a candidate for ablation due to BMI or advanced therapies due to CKD, hernia, ostomy.   Today's Discussion (06/10/2020):  *Please note that this is a verbal dictation therefore any spelling or grammatical errors are due to the "[place]" system interpretation.  I met with Roberto [name] at bedside this morning.  He reports feeling much improved as compared to 2 days prior.  He shares that he is no longer having abdominal discomfort and endorses that his medications seem to be more appropriate now.  Discusses that he has more energy and has been up walking throughout the halls.  We discussed advance care planning: Roberto [name] had thought about his living well and was able to complete this with me.  It has been placed at the front of the chart for official notorization.  We reviewed and completed a MOST form together as below:  Cardiopulmonary Resuscitation: Attempt rescucitation  Medical Interventions: Full Scope of Treatment: Use intubation, advanced airway interventions, mechanical ventilation, cardioversion as indicated, medical treatment, IV fluids, etc, also provide comfort measures. Transfer to the hospital if indicated  Antibiotics: Antibiotics if indicated  IV Fluids: IV fluids if indicated  Feeding Tube: Feeding tube for a defined trial period   Questions and concerns addressed   Objective Assessment: Vital Signs Vitals:   06/09/20 2019 06/10/20 0343  BP: (!) 114/58 100/67  Pulse: 69  66  Resp: 16 19  Temp: 97.6 F (36.4 C) 98 F (36.7 C)  SpO2: 94% 95%    Intake/Output Summary (Last 24 hours) at 06/10/2020 1227 Last data filed at 06/10/2020 0957 Gross per 24 hour  Intake 480 ml  Output 1550 ml  Net -1070 ml   Last Weight  Most recent update: 06/10/2020  3:45 AM   Weight  150.5 kg (331 lb 11.2 oz)             Gen:  Heavy set M in NAD HEENT: moist mucous membranes CV: Irregular rythm PULM: clear to auscultation bilaterally  ABD: soft/nontender  EXT: (+) trace edema Neuro: Alert and oriented x3  SUMMARY OF RECOMMENDATIONS   Full Code - Patient not ready to be DNR  MOST completed  Appreciate Spiritual team helping to complete advance directives (these have been completed and placed on the front of the chart for notarization)   Will request OP Palliative support  Time Spent: 35 Greater than 50% of the time was spent in counseling and coordination of care ______________________________________________________________________________________ [place] Team Team Cell Phone: [phone] Please utilize secure chat with additional questions, if there is no response within 30 minutes please call the above phone number  Palliative Medicine Team providers are available by phone from 7am to 7pm daily and can be reached through the team cell phone.  Should this patient require assistance outside of these hours, please call the patient's attending physician.

## 2020-06-10 NOTE — Discharge Summary (Signed)
Discharge Summary    Patient ID: Roberto [name] MRN: [id number]; DOB: Oct 16, 1965  Admit date: 06/01/2020 Discharge date: 06/10/2020  PCP:  [name], MD   [facility] Providers Cardiologist:  Arvilla Meres, MD  Electrophysiologist:  Regan Lemming, MD       Discharge Diagnoses    Principal Problem:   Ventricular tachyarrhythmia [facility]) Active Problems:   Morbid obesity with BMI of 50.0-59.9, adult (HCC)   Nonischemic cardiomyopathy (HCC)   Paroxysmal SVT (supraventricular tachycardia) (HCC)   Hypokalemia   Atypical atrial flutter (HCC)   Gout   PAF (paroxysmal atrial fibrillation) (HCC)   Hypomagnesemia   Chronic systolic CHF (congestive heart failure) (HCC)   Acute kidney injury superimposed on chronic kidney disease (HCC)    Diagnostic Studies/Procedures    N/A _____________   History of Present Illness     Roberto [name] is a 55 y.o. 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  Roberto [name] has been managed by the CHF and EP team. Per chart review, he was admitted in 2013 with recurrent HF and underwent cath with normal coronaries but severe LV dysfunction. IN 2018 he was admitted with AFL and tachy-induced cardiomyopathy with EF dropping 40->15%. He underwent DCCV and was started on amiodarone. He eventually underwent AFL ablation 2018. He was admitted in 12/2018-01/2019 for complex admission for acute diverticulitis. He failed IV abx. Underwent R colectomy with ileostomy. In OR patient became hypotensive, required pressors. Course c/b perforated R colon with right lateral peritoneal fluid collection s/p  right lateral abdominal drain placement in IR 01/04/2019. Post-op he struggleed with non-healing abdominal wound and underwent skin grafting on 09/05/19. He has been managed with intermittent diuretic adjustment/GDMT by the CHF team. He had ICD shock for VT in 12/2019 in the context of hypokalemia - was instructed to take 1 dose of metolazone and took 2 around that time. He was previously on Entresto which was stopped due to AKI. He was last seen in OP follow-up 05/17/20 and felt stable. He was seen in the ED 5/12 for worsening SOB. K was borderline at 3.7. Torsemide was increased with continued SOB and edema, but K was not adjusted. He is on chronic anticoagulation with Eliquis. He was also on amiodarone PTA.  He presented to the hospital this admission 06/01/20 with VT. 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 He did have loss of bladder and was unresponsive for a short time. He was on his way to the bathroom. He had a skin tear right elbow noted, unsure if he hit his head. Pertinent labs on admission include K 3.4, Cr 2.1, Mg 1.7, HS-Trop 35, WBC 9.5, Hgb 16.2. ICD interrogation showed that patient had VF with appropriate therapy with CL as fast as 160 ms (>300 bpm). He was admitted for further management.  Hospital Course     1. VF/VT  - Received shock x1 as OP - Found to be hypokalemic/hypomagnesemic requiring repletion - Started on IV amiodarone with conversion back to higher dose of oral amiodarone - Had recurrent VT treated with ATP 06/04/20 -> mexiletine  added - Had recurrent VT 06/05/20 - rebolused with amiodarone, mexiletine was uptitrated, and Ranexa added with transition back to oral amiodarone - Carvedilol was titrated from 3.125mg  to 6.25mg  BID - Per Dr. Graciela Husbands, antiarrhythmic discharge med plan includes carvedilol as above, Ranexa 500mg  BID, mexiletine 300mg  q12hr (decreased day of DC), and amiodarone 400mg  BID  x 2 weeks, then 200mg  BID x 2 weeks, then 200mg  daily - I spoke with pt's pharmacy day of discharge and they confirmed they have both mexiletine and Ranexa in stock but mexiletine is only available in 150mg  capsules so that is why that size capsule will be prescribed. Per Dr. , the patient was instructed that if he finds his insurance requires prior authorization when he goes to pick up Ranexa, he would recommend the patient contact the office on Tuesday 5/31 to pick up samples - patient aware - Further monitoring of organ systems while on amiodarone will be at discretion of primary cardiologist - Patient advised no driving until cleared by cardiologist (typically 6 months after last ventricular arrhythmia)  2. Chronic Combined Systolic/Diastolic Heart Failure  - Echo 12/21 EF 25-30% -NYHA III. Volume status appeared ok. Optivol was not suggestive of fluid accumulation.  - While admitted, he was continued on torsemide with increase in spironolactone to 50mg  dose for persistent hypokalemia - He developed worsening renal insufficiency towards end of hospital stay so digoxin was held temporarily and torsemide was decreased - Dr. reviewed med plan with Dr. - per verbal discussion with Dr. , the patient should continue present dose of spironolactone 50mg  daily, hold potassium and torsemide tomorrow (already received today), then resume potassium Graciela Husbands daily and torsemide 40mg  BID on Tuesday 06/12/20 - Per d/w Dr. , OK to resume digoxin at discharge since digoxin level was OK - Discharge Cr 2.72 - He was felt to be nearing end-stage cardiomyopathy, not a candidate for VAD with CKD, large hernia and ostomy - Given his VT and end-stage heart failure the patient was seen in consultation by the palliative care team - at this time he prefers to continue full code, but palliative care team will be arranging OP palliative team to continue to follow per their notes - Remains on Farxiga  at dc  3. PAF/flutter - s/p AFlutter Ablation 2018 - On Eliquis PTA, continued here - Now on amiodarone as above - NSR with ventricular arrhythmias this admission  4. AKI on CKD Stage IIIb - Previous creatinine baseline 1.8-2 - See above regarding renal function - DC Cr 2.72  5. Obesity - Body mass index is 50.43 kg/m.  - Dr. Graciela Husbands suggests considering sleep study as OP  6.  H/o Acute diverticulitis with colon resection and ileostomy - S/p skin grafting 09/05/19 -No plan for reversal of ileostomy after extensive abdominal hernia  7. Gout flare - Uric acid level 12 - Did not tolerate colchicine due to n/v - Tx with short course of prednisone  The patient has follow-up scheduled 06/22/20 in the CHF clinic but did send a message to the AHF clinic to see about moving up closer. Dr. has seen and examined the patient today and feels he is stable for discharge. Medications are outlined below as per discussion with MD. I also verbally reviewed med instructions with patient who was appreciative of care.   Did the patient have an acute coronary syndrome (MI, NSTEMI, STEMI, etc) this admission?:  No  Did the patient have a percutaneous coronary intervention (stent / angioplasty)?:  No.      _____________  Discharge Vitals Blood pressure 100/67, pulse 66, temperature 98 F (36.7 C), temperature source Oral, resp. rate 19, height 5\' 8"  (1.727 m), weight (!) 150.5 kg, SpO2 95 %.  Filed Weights   06/08/20 0525 06/09/20 0452 06/10/20 0343  Weight: (!) 151.3 kg (!) 150.2 kg (!) 150.5 kg    Labs & Radiologic Studies    Basic Metabolic Panel Recent Labs    94/80/16 0134 06/10/20 0317  NA 136 134*  K 4.8 4.2  CL 98 101  CO2 25 23  GLUCOSE 139* 133*  BUN 49* 63*  CREATININE 2.75* 2.72*  CALCIUM 8.8* 8.5*  MG 2.6* 2.6*   Liver Function Tests No results for input(s): AST, ALT, ALKPHOS, BILITOT, PROT, ALBUMIN in the last 72 hours. No results  for input(s): LIPASE, AMYLASE in the last 72 hours. High Sensitivity Troponin:   Recent Labs  Lab 05/24/20 0759 06/01/20 0909 06/01/20 1103  TROPONINIHS 29* 35* 31*    _____________  CT Head Wo Contrast  Result Date: 06/01/2020 CLINICAL DATA:  Syncope, fall, on anticoagulation EXAM: CT HEAD WITHOUT CONTRAST TECHNIQUE: Contiguous axial images were obtained from the base of the skull through the vertex without intravenous contrast. COMPARISON:  12/16/2019 FINDINGS: Brain: No acute intracranial abnormality. Specifically, no hemorrhage, hydrocephalus, mass lesion, acute infarction, or significant intracranial injury. Vascular: No hyperdense vessel or unexpected calcification. Skull: No acute calvarial abnormality. Sinuses/Orbits: No acute findings Other: None IMPRESSION: Negative. Electronically Signed   By: Charlett Nose M.D.   On: 06/01/2020 10:20   DG Chest Portable 1 View  Result Date: 06/01/2020 CLINICAL DATA:  55 year old male with shortness of breath and syncope. EXAM: PORTABLE CHEST 1 VIEW COMPARISON:  Portable chest 05/24/2020 and earlier. FINDINGS: Portable AP semi upright view at and 0924 hours. Stable cardiomegaly and mediastinal contours. Stable left chest AICD. Decreased pulmonary vascularity. Allowing for portable technique the lungs are clear. No pneumothorax or pleural effusion. Visualized tracheal air column is within normal limits. No acute osseous abnormality identified. IMPRESSION: Pulmonary edema appears resolved since 05/24/2020. Cardiomegaly with no acute cardiopulmonary abnormality. Electronically Signed   By: Odessa Fleming M.D.   On: 06/01/2020 09:41   DG Chest Port 1 View  Result Date: 05/24/2020 CLINICAL DATA:  Shortness of breath EXAM: PORTABLE CHEST 1 VIEW COMPARISON:  04/13/2020 FINDINGS: Unchanged moderate cardiomegaly. Mild pulmonary vascular congestion. Mild patchy bilateral airspace opacities likely combination of atelectasis pulmonary edema. Single lead left chest wall  AICD unchanged in configuration. IMPRESSION: Mild interval progression of findings of CHF/fluid volume overload. Electronically Signed   By: Acquanetta Belling M.D.   On: 05/24/2020 08:44   Disposition   Pt is being discharged home today in good condition.  Follow-up Plans & Appointments     Follow-up Information    Bensimhon, Bevelyn Buckles, MD Follow up.   Specialty: Cardiology Why: June 22, 2020 at 9:40 AM [facility], Entrance C. Parking Garage Code [phone]. The Heart Failure clinic will likely call you for a sooner appointment. Contact information: [address] [place] [address] [phone]              Discharge Instructions    Diet - low sodium heart healthy   Complete by: As directed    Discharge instructions   Complete by: As directed    You will go home on  2 new medicines, mexiletine and ranolazine (Ranexa). If you find that ranolazine requires a prior authorization when you go to pick this up, please call the office first thing Tuesday morning to pick up samples instead.  Do not take any potassium or torsemide tomorrow. Please RESTART both of these on Tuesday 06/12/20.  Several of your previous medication doses have changed - amiodarone, carvedilol, spironolactone. We sent in the new prescription/doses to your pharmacy listed.  You will continue your digoxin for now per Dr. Graciela Husbands.   Increase activity slowly   Complete by: As directed    You may not drive until cleared by your cardiology team.      Discharge Medications   Allergies as of 06/10/2020      Reactions   Shrimp [shellfish Allergy] Hives, Itching      Medication List    TAKE these medications   amiodarone 200 MG tablet Commonly known as: PACERONE Take 2 tablets (400 mg) total by mouth twice daily for 2 weeks, then decrease to 1 tablet (200 mg) twice daily for 2 weeks, then decrease 1 tablet (200 mg) daily. What changed:   how much to  take  how to take this  when to take this  additional instructions   carvedilol 6.25 MG tablet Commonly known as: COREG Take 1 tablet (6.25 mg total) by mouth 2 (two) times daily with a meal. What changed:   medication strength  See the new instructions.   dapagliflozin propanediol 10 MG Tabs tablet Commonly known as: Farxiga Take 1 tablet (10 mg total) by mouth daily.   digoxin 0.125 MG tablet Commonly known as: LANOXIN Take 1 tablet (0.125 mg total) by mouth daily.   Eliquis 5 MG Tabs tablet Generic drug: apixaban TAKE 1 TABLET(5 MG) BY MOUTH TWICE DAILY What changed: See the new instructions.   mexiletine 150 MG capsule Commonly known as: MEXITIL Take 2 capsules (300 mg total) by mouth every 12 (twelve) hours.   potassium chloride SA 20 MEQ tablet Commonly known as: KLOR-CON Take 2 tablets (40 mEq total) by mouth daily. Notes to patient: Do not take any potassium or torsemide tomorrow. Please RESTART both of these on Tuesday 06/12/20.    ranolazine 500 MG 12 hr tablet Commonly known as: RANEXA Take 1 tablet (500 mg total) by mouth 2 (two) times daily.   spironolactone 25 MG tablet Commonly known as: ALDACTONE Take 2 tablets (50 mg total) by mouth daily. What changed: how much to take   torsemide 20 MG tablet Commonly known as: DEMADEX Take 2 tablets (40 mg total) by mouth 2 (two) times daily. What changed: how much to take Notes to patient: Do not take any potassium or torsemide tomorrow. Please RESTART both of these on Tuesday 06/12/20.           Outstanding Labs/Studies   Baseline TSH added on day of dc - Further monitoring of organ systems while on amiodarone will be at discretion of primary cardiologist.  Recommend further monitoring of renal function as OP.  Duration of Discharge Encounter   Greater than 30 minutes including physician time.  Signed, Laurann Montana, PA-C 06/10/2020, 3:32 PM

## 2020-06-22 ENCOUNTER — Other Ambulatory Visit: Payer: Self-pay

## 2020-06-22 ENCOUNTER — Encounter (HOSPITAL_COMMUNITY): Payer: Self-pay | Admitting: Internal Medicine

## 2020-06-22 ENCOUNTER — Telehealth (HOSPITAL_COMMUNITY): Payer: Self-pay | Admitting: *Deleted

## 2020-06-22 ENCOUNTER — Ambulatory Visit (HOSPITAL_COMMUNITY)
Admission: RE | Admit: 2020-06-22 | Discharge: 2020-06-22 | Disposition: A | Discharge status: Home / Self Care | Payer: BC Managed Care – PPO | Source: Ambulatory Visit | Attending: Internal Medicine | Admitting: Internal Medicine

## 2020-06-22 VITALS — BP 106/78 | HR 77 | Ht 68.0 in | Wt 337.2 lb

## 2020-06-22 DIAGNOSIS — K30 Functional dyspepsia: Secondary | ICD-10-CM | POA: Insufficient documentation

## 2020-06-22 DIAGNOSIS — Z7901 Long term (current) use of anticoagulants: Secondary | ICD-10-CM | POA: Diagnosis not present

## 2020-06-22 DIAGNOSIS — I472 Ventricular tachycardia, unspecified: Secondary | ICD-10-CM

## 2020-06-22 DIAGNOSIS — Z7984 Long term (current) use of oral hypoglycemic drugs: Secondary | ICD-10-CM | POA: Insufficient documentation

## 2020-06-22 DIAGNOSIS — R0602 Shortness of breath: Secondary | ICD-10-CM | POA: Insufficient documentation

## 2020-06-22 DIAGNOSIS — Z86711 Personal history of pulmonary embolism: Secondary | ICD-10-CM | POA: Insufficient documentation

## 2020-06-22 DIAGNOSIS — I5022 Chronic systolic (congestive) heart failure: Secondary | ICD-10-CM | POA: Insufficient documentation

## 2020-06-22 DIAGNOSIS — N1832 Chronic kidney disease, stage 3b: Secondary | ICD-10-CM | POA: Insufficient documentation

## 2020-06-22 DIAGNOSIS — I428 Other cardiomyopathies: Secondary | ICD-10-CM | POA: Diagnosis not present

## 2020-06-22 DIAGNOSIS — Z79899 Other long term (current) drug therapy: Secondary | ICD-10-CM | POA: Diagnosis not present

## 2020-06-22 DIAGNOSIS — Z09 Encounter for follow-up examination after completed treatment for conditions other than malignant neoplasm: Secondary | ICD-10-CM | POA: Diagnosis not present

## 2020-06-22 DIAGNOSIS — Z86718 Personal history of other venous thrombosis and embolism: Secondary | ICD-10-CM | POA: Diagnosis not present

## 2020-06-22 DIAGNOSIS — I480 Paroxysmal atrial fibrillation: Secondary | ICD-10-CM | POA: Diagnosis not present

## 2020-06-22 LAB — CBC
HCT: 54.6 % — ABNORMAL HIGH (ref 39.0–52.0)
Hemoglobin: 17.5 g/dL — ABNORMAL HIGH (ref 13.0–17.0)
MCH: 30.3 pg (ref 26.0–34.0)
MCHC: 32.1 g/dL (ref 30.0–36.0)
MCV: 94.6 fL (ref 80.0–100.0)
Platelets: 185 10*3/uL (ref 150–400)
RBC: 5.77 MIL/uL (ref 4.22–5.81)
RDW: 13.8 % (ref 11.5–15.5)
WBC: 11.2 10*3/uL — ABNORMAL HIGH (ref 4.0–10.5)
nRBC: 0 % (ref 0.0–0.2)

## 2020-06-22 LAB — COMPREHENSIVE METABOLIC PANEL
ALT: 25 U/L (ref 0–44)
AST: 25 U/L (ref 15–41)
Albumin: 3.1 g/dL — ABNORMAL LOW (ref 3.5–5.0)
Alkaline Phosphatase: 47 U/L (ref 38–126)
Anion gap: 12 (ref 5–15)
BUN: 22 mg/dL — ABNORMAL HIGH (ref 6–20)
CO2: 23 mmol/L (ref 22–32)
Calcium: 8.9 mg/dL (ref 8.9–10.3)
Chloride: 99 mmol/L (ref 98–111)
Creatinine, Ser: 2.21 mg/dL — ABNORMAL HIGH (ref 0.61–1.24)
GFR, Estimated: 35 mL/min — ABNORMAL LOW (ref >60)
Glucose, Bld: 155 mg/dL — ABNORMAL HIGH (ref 70–99)
Potassium: 3.9 mmol/L (ref 3.5–5.1)
Sodium: 134 mmol/L — ABNORMAL LOW (ref 135–145)
Total Bilirubin: 1.1 mg/dL (ref 0.3–1.2)
Total Protein: 7.4 g/dL (ref 6.5–8.1)

## 2020-06-22 LAB — DIGOXIN LEVEL: Digoxin Level: 1 ng/mL (ref 0.8–2.0)

## 2020-06-22 MED ORDER — PANTOPRAZOLE SODIUM 40 MG PO TBEC: 40.0000 mg | DELAYED_RELEASE_TABLET | Freq: Every day | ORAL | 3 refills | Status: AC

## 2020-06-22 MED ORDER — DIGOXIN 125 MCG PO TABS: 0.0625 mg | ORAL_TABLET | Freq: Every day | ORAL | 3 refills | Status: AC

## 2020-06-22 NOTE — Progress Notes (Signed)
Advanced Heart Failure Clinic Note   Patient ID: Roberto [name], male   DOB: 16-Nov-1965, 55 y.o.   MRN: [id number] PCP: Dr. Susann [name] Cardiologist: Dr. [name] EP: Dr. Johney [name] AHF: Dr. Gala [name]   HPI: Roberto [name] is a 55 y.o. 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    Admitted to [facility] 12/16/11 for recurrent HF. Cath with no CAD.   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 EF dropped from 40%-> 15%. Underwent successful DCCV. Started on amiodarone. S/p AFL ablation 11/17/2016  Echo 7/20: EF 25-30%   Admitted 12/19/18- 01/24/19 for acute diverticulits.Underwent R colectomy with ileostomy.  In OR patient became hypotensive, required pressors. Course c/b perforated R colon with right lateral peritoneal fluid collection s/p right lateral abdominal drain placement in IR 01/04/2019. Post-op struggling with non-healing ab wound and underwent skin grafting on 09/05/19. No plans fo ileostomy reversal due to hernia.   We saw him on 12/14/19, doing well volume overloaded. Instructed to take 1 dose of metolazone, He took 2 doses & developed hypokalemia. Had syncope with head trauma and ICD shock. ICD interrogation showed VT.   Admitted 4/22  With VT& A/C systolic heart failure. Diuresed with IV lasix. Had RHC with with low filling pressures and normal cardiac output.  Discharged on 04/19/20 with torsemide 40 mg daily.   Admitted 5/22 with recurrent VT. Seen by EP. Treated with amio and mexilitene   Today he returns for post-hospital follow up. Feeling ok.Out living with his mom. Anxious to do too much. SOB with mild activity. No CP. No ICD shocks. No orthopnea or PND. Mild LE edema at night, Complaint with all meds.Bad indigestion with mexilitene. Taking Mylanta  ICD interrogation: No VT since 06/03/20. Volume low. Activity near 0. Personally reviewed   Cardiac  studies: Echo (12/21): EF 20-25%, RV ok, personally reviewed Echo (07/2017): EF appears slightly improved to 20-25%. Possible clot (vs artifact in IVC/RA junction) Personally reviewed Echo (10/15/16): LVEF 15% Echo (5/14): EF 40-45% Echo (12/13): EF 25-30%  TEE (03/20/17): EF 15-20%, Severe reduced RV function  RHC 04/2020  RA = 2 RV = 24/1 PA = 25/3 (15) PCW = 7 Fick cardiac output/index = 6.0/23 Thermo CO/CI = 6.1/2.4 PVR = 0.7 WU FA sat = 95% PA sat = 67%, 68%  Assessment: 1. Low filling pressures 2. Normal cardiac output  RHC 10/20/16  RA = 3 RV = 45/7 PA = 49/20 (31) PCW = 28 Fick cardiac output/index = 5.0/2.1 PVR = 0.5 WU Ao sat = 95% PA sat = 64%, 61%  CARDIAC MRI - 12/18/11  1. Moderately dilated left ventricle with moderately decreased systolic function, EF 38%. Global hypokinesis.  2. Mildly dilated right ventricle with mild to moderately decreased systolic function.  3. No definite LV thrombus noted.  4. Patchy non-subendocardial delayed enhancement seen in the ventricular septum (see above for description). This is not suggestive of a coronary disease pattern. This could be suggestive of infiltrative disease versus prior myocarditis.  RHC/LHC 12/22/11  Cors: Normal  SH: Lives with his wife and 5 children. Works full time running a Daycare  Review of systems complete and found to be negative unless listed in HPI.    Past Medical History:  Diagnosis Date   AICD (automatic cardioverter/defibrillator) present 08/2018   Medtronic ICD   Arthritis    knee   Chronic combined  systolic and diastolic CHF (congestive heart failure) (HCC)    Chronic venous insufficiency    Coronary artery disease    Deep vein thrombosis (HCC) 2007   left leg   Diverticulitis    c/b perforated colon and nonhealing abdominal wound s/p skin grafting, ileostomy   Hepatitis    patient denies this dx   Lupus anticoagulant positive    Morbid obesity (HCC)    Morbid obesity with BMI of  50.0-59.9, adult (HCC)    Mural thrombus of heart    coumadin   Nonischemic cardiomyopathy (HCC)    a) 12/17/11 echo: LVEF 25-30%, grade 3 diastolic dysfunction (c/w restriction), mod MR, mod LA/LV and mild RA dilatation; b) 12/18/11 cMRI: LVEF 38%, mod LV/mild RV dilatation, global HK, mild-mod RV sys dysfxn, no LV thrombus & patchy non-subendocardial delayed enhancement c/w infil dz or prior myocarditis; c. 07/2018 EF 25-30%.   Paroxysmal SVT (supraventricular tachycardia) (HCC)    Pneumonia    x 2   Pulmonary embolism (HCC)    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   Ventricular tachyarrhythmia [name])    Prior VT/VF   Wears glasses    Wound of abdomen     Current Outpatient Medications  Medication Sig Dispense Refill   amiodarone (PACERONE) 200 MG tablet Take 2 tablets (400 mg) total by mouth twice daily for 2 weeks, then decrease to 1 tablet (200 mg) twice daily for 2 weeks, then decrease 1 tablet (200 mg) daily. 90 tablet 0   carvedilol (COREG) 6.25 MG tablet Take 1 tablet (6.25 mg total) by mouth 2 (two) times daily with a meal. 60 tablet 3   dapagliflozin propanediol (FARXIGA) 10 MG TABS tablet Take 1 tablet (10 mg total) by mouth daily. 30 tablet 3   digoxin (LANOXIN) 0.125 MG tablet Take 1 tablet (0.125 mg total) by mouth daily. 30 tablet 3   ELIQUIS 5 MG TABS tablet TAKE 1 TABLET(5 MG) BY MOUTH TWICE DAILY 60 tablet 3   mexiletine (MEXITIL) 150 MG capsule Take 2 capsules (300 mg total) by mouth every 12 (twelve) hours. 120 capsule 3   potassium chloride SA (KLOR-CON) 20 MEQ tablet Take 2 tablets (40 mEq total) by mouth daily. 60 tablet 3   ranolazine (RANEXA) 500 MG 12 hr tablet Take 1 tablet (500 mg total) by mouth 2 (two) times daily. 60 tablet 3   spironolactone (ALDACTONE) 25 MG tablet Take 2 tablets (50 mg total) by mouth daily. 60 tablet 3   torsemide (DEMADEX) 20 MG tablet Take 2 tablets (40 mg total) by mouth 2 (two) times daily. 60 tablet 2   No current  facility-administered medications for this encounter.   PHYSICAL EXAM: Vitals:   06/22/20 0945  BP: 106/78  Pulse: 77  SpO2: 97%  Weight: (!) 153 kg (337 lb 3.2 oz)  Height: 5\' 8"  (1.727 m)   Wt Readings from Last 3 Encounters:  06/22/20 (!) 153 kg (337 lb 3.2 oz)  06/10/20 (!) 150.5 kg (331 lb 11.2 oz)  05/17/20 (!) 162.5 kg (358 lb 3.2 oz)     PHYSICAL EXAM: General:  Well appearing, morbidly obese. No respiratory difficulty HEENT: normal Neck: supple. no JVD. Carotids 2+ bilat; no bruits. No lymphadenopathy or thryomegaly appreciated. Cor: PMI nondisplaced. Regular rate & rhythm. No rubs, gallops or murmurs. Lungs: clear Abdomen: obese soft, nontender, nondistended. No hepatosplenomegaly. No bruits or masses. Good bowel sounds. +ileostomy Extremities: no cyanosis, clubbing, rash, tr edema cool Neuro: alert & orientedx3,  cranial nerves grossly intact. moves all 4 extremities w/o difficulty. Affect pleasant   ASSESSMENT & PLAN:  1. H/o VT - On 12/16/19 in setting of hypokalemia related to metolazone use. - admitted 4/22 and 5/22 with recurrent VT - followed by Dr. Elberta Fortis - now on amio and mexilitene. Will start PPI for indigestion  - No recurrent VT on ICD interrogation  - No driving for 6 months (~99/83). - BMET from 05/14/20 stable.   2. Chronic Systolic Heart Failure: - Echo 10/15/16 - LVEF 15% - TEE 03/2017 LVEF 15-20% - Echo 07/2017 with LVEF 20-25% - Echo 7/20 EF 25-30% - Echo 12/21 EF 25-30% - Chronic NYHA III-IIIB. Volume status ok  - Continue torsemide 40 mg daily. Asked him to call if weight is above 355 pounds.  - Continue Spiro 12.5 mg daily  - Continue digoxin 0.125 mg daily. 04/14/20  Dig level <0.2  - Continue carvedilol 3.125 bid.  - Off Entresto due to recent AKI. May retry down the road.  - Continue farxiga 10 mg daily.  - Nearing end-stage. Not candidate for advanced therapies with size. RV dysfunction, CKD and ileostomy - Check labs today - Slowly  increase exercise. Not interested in CR right now  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 - Maintaining NSR on EKG today  - Continue amiodarone  - Continue Eliquis 5 bid. No bleeding  4. H/o Acute diverticulitis with colon resection and ileostomy - S/p skin grafting 09/05/19. - No plan for reversal of ileostomy after extensive abdominal hernia.   5. CKD Stage IIIb - Creatinine baseline 1.8-2  - labs today  Arvilla Meres, MD 06/22/20

## 2020-06-22 NOTE — Patient Instructions (Signed)
Start protonix 40 mg (1tablet) daily  Labs done today, your results will be available in MyChart, we will contact you for abnormal readings.  Your physician recommends that you schedule a follow-up appointment in: 1 month  Follow up with Dr. Andrena Mews will call you to schedule an appointment  If you have any questions or concerns before your next appointment please send [place] a message through [facility] or call our office at [phone].    TO LEAVE A MESSAGE FOR THE NURSE SELECT OPTION 2, PLEASE LEAVE A MESSAGE INCLUDING: YOUR NAME DATE OF BIRTH CALL BACK NUMBER REASON FOR CALL**this is important as we prioritize the call backs  YOU WILL RECEIVE A CALL BACK THE SAME DAY AS LONG AS YOU CALL BEFORE 4:00 PM At the [facility] Clinic, you and your health needs are our priority. As part of our continuing mission to provide you with exceptional heart care, we have created designated Provider Care Teams. These Care Teams include your primary Cardiologist (physician) and Advanced Practice Providers (APPs- Physician Assistants and Nurse Practitioners) who all work together to provide you with the care you need, when you need it.   You may see any of the following providers on your designated Care Team at your next follow up: Dr Arvilla Meres Dr Marca Ancona Dr Brandon Melnick, NP Robbie Lis, [place] Mikki Santee Karle Plumber, PharmD   Please be sure to bring in all your medications bottles to every appointment.

## 2020-06-22 NOTE — Telephone Encounter (Signed)
Modesta Messing, [place]  06/22/2020  4:49 PM EDT Back to Top     Spoke with pt he is aware and agreeable.    Dolores Patty, MD  06/22/2020  1:57 PM EDT      Please cut digoxin in half.

## 2020-06-22 NOTE — Addendum Note (Signed)
Encounter addended by: Suezanne Cheshire, RN on: 06/22/2020 10:44 AM  Actions taken: Charge Capture section accepted, Clinical Note Signed

## 2020-06-22 NOTE — Telephone Encounter (Signed)
-----   Message from Dolores Patty, MD sent at 06/22/2020  1:57 PM EDT ----- Please cut digoxin in half.

## 2020-06-22 NOTE — Addendum Note (Signed)
Encounter addended by: Suezanne Cheshire, RN on: 06/22/2020 10:37 AM  Actions taken: Visit diagnoses modified, Diagnosis association updated, Order list changed, Pharmacy for encounter modified

## 2020-06-25 ENCOUNTER — Ambulatory Visit (INDEPENDENT_AMBULATORY_CARE_PROVIDER_SITE_OTHER): Payer: BC Managed Care – PPO

## 2020-06-25 DIAGNOSIS — Z95810 Presence of automatic (implantable) cardiac defibrillator: Secondary | ICD-10-CM | POA: Diagnosis not present

## 2020-06-25 DIAGNOSIS — I5022 Chronic systolic (congestive) heart failure: Secondary | ICD-10-CM

## 2020-06-26 ENCOUNTER — Telehealth: Payer: Self-pay

## 2020-06-26 NOTE — Telephone Encounter (Signed)
Remote ICM transmission received.  Attempted call to patient regarding ICM remote transmission and recording stated call cannot be completed at this time.   

## 2020-06-26 NOTE — Progress Notes (Signed)
EPIC Encounter for ICM Monitoring  Patient Name: Roberto [name] is a 55 y.o. male Date: 06/26/2020 Primary Care Physican: [name], MD Primary Cardiologist: Bensimhon Electrophysiologist: Elberta Fortis 06/22/2020 Office Weight: 337 lbs    Time in AF     0.0 hr/day (0.0%)                                                          Attempted call to patient and unable to reach.  Transmission reviewed.  Hospitalized 5/22- 5/29 5/22 with recurrent VT and device shock   Optivol thoracic impedance suggesting possible dryness starting 06/02/2020 and trending closer to baseline.   Prescribed: Torsemide 20 mg Take 2 tablets (40 mg total) by mouth twice a day Potassium 20 mEq take 2 tablets (40 mEq total) daily.   Spironolactone 25 mg take 2 tablets (50 mg total) daily Farxiga 10 mg take 1 tablet daily   Labs: 06/22/2020 Creatinine 2.21, BUN 22, Potassium 3.9, Sodium 14, GFR 35 06/10/2020 Creatinine 2.72, BUN 63, Potassium 4.2, Sodium 134, GFR 27  06/09/2020 Creatinine 2.75, BUN 49, Potassium 4.8, Sodium 136, GFR 27  A complete set of results can be found in Results Review.   Recommendations:  Unable to reach.     Follow-up plan: ICM clinic phone appointment on 07/30/2020.   91 day device clinic remote transmission 07/27/2020.     EP/Cardiology Office Visits:  07/06/2020 with Dr Elberta Fortis. 08/15/2020 with Dr Gala Romney.     Copy of ICM check sent to Dr. Elberta Fortis.   3 month ICM trend: 06/25/2020.    1 Year ICM trend:       Karie Soda, RN 06/26/2020 12:16 PM

## 2020-07-02 ENCOUNTER — Encounter (HOSPITAL_COMMUNITY): Payer: BC Managed Care – PPO | Admitting: Internal Medicine

## 2020-07-06 ENCOUNTER — Encounter: Payer: Self-pay | Admitting: Cardiology

## 2020-07-06 ENCOUNTER — Other Ambulatory Visit: Payer: Self-pay

## 2020-07-06 ENCOUNTER — Ambulatory Visit (INDEPENDENT_AMBULATORY_CARE_PROVIDER_SITE_OTHER): Payer: BC Managed Care – PPO | Admitting: Cardiology

## 2020-07-06 VITALS — BP 110/70 | HR 82 | Ht 68.0 in | Wt 341.0 lb

## 2020-07-06 DIAGNOSIS — I472 Ventricular tachycardia, unspecified: Secondary | ICD-10-CM

## 2020-07-06 NOTE — Progress Notes (Signed)
Electrophysiology Office Note   Date:  07/06/2020   ID:  ADRIENE [name], DOB [birth year], MRN [id number]  PCP:  [name], MD  Cardiologist:  Bensimhon Primary Electrophysiologist:  Taavi Hoose Jorja Loa, MD    No chief complaint on file.    History of Present Illness: Roberto [name] is a 55 y.o. male who is being seen today for the evaluation of atrial flutter at the request of [name], MD. Presenting today for electrophysiology evaluation.   He has a history significant for systolic heart failure due to nonischemic cardiomyopathy, recurrent DVTs and PEs, chronic venous insufficiency, LV thrombus, SVT, and morbid obesity.  He was admitted 10-18 with atrial fibrillation and tachycardia induced myopathy.  His ejection fraction dropped from 40% to 15%.  He underwent cardioversion to sinus rhythm and was placed on amiodarone.  He was diuresed 12 pounds with a discharge weight of 279 pounds.  He returned to heart failure clinic in atrial flutter with a rate of 115.  He had a typical atrial flutter ablation on 11/17/2016.  He went underwent cardioversion for atrial fibrillation 03/20/2017.  He was admitted to the hospital 06/01/2020 after an episode of syncope and ventricular tachycardia.  He was reloaded on amiodarone.  He continued to have episodes of VT throughout his hospitalization and was started on mexiletine.  Since discharge, he has done well.  He has had no further ventricular arrhythmias.  Today, denies symptoms of palpitations, chest pain, shortness of breath, orthopnea, PND, lower extremity edema, claudication, dizziness, presyncope, syncope, bleeding, or neurologic sequela. The patient is tolerating medications without difficulties.     Past Medical History:  Diagnosis Date   AICD (automatic cardioverter/defibrillator) present 08/2018   Medtronic ICD   Arthritis    knee   Chronic combined systolic and diastolic CHF (congestive heart failure) (HCC)    Chronic venous  insufficiency    Coronary artery disease    Deep vein thrombosis (HCC) 2007   left leg   Diverticulitis    c/b perforated colon and nonhealing abdominal wound s/p skin grafting, ileostomy   Hepatitis    patient denies this dx   Lupus anticoagulant positive    Morbid obesity (HCC)    Morbid obesity with BMI of 50.0-59.9, adult (HCC)    Mural thrombus of heart    coumadin   Nonischemic cardiomyopathy (HCC)    a) 12/17/11 echo: LVEF 25-30%, grade 3 diastolic dysfunction (c/w restriction), mod MR, mod LA/LV and mild RA dilatation; b) 12/18/11 cMRI: LVEF 38%, mod LV/mild RV dilatation, global HK, mild-mod RV sys dysfxn, no LV thrombus & patchy non-subendocardial delayed enhancement c/w infil dz or prior myocarditis; c. 07/2018 EF 25-30%.   Paroxysmal SVT (supraventricular tachycardia) (HCC)    Pneumonia    x 2   Pulmonary embolism (HCC)    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   Ventricular tachyarrhythmia [facility])    Prior VT/VF   Wears glasses    Wound of abdomen    Past Surgical History:  Procedure Laterality Date   A-FLUTTER ABLATION N/A 11/17/2016   Procedure: A-FLUTTER ABLATION;  Surgeon: Regan Lemming, MD;  Location: [facility] INVASIVE CV LAB;  Service: Cardiovascular;  Laterality: N/A;   APPLICATION OF WOUND VAC N/A 09/05/2019   Procedure: APPLICATION OF WOUND VAC;  Surgeon: Allena Napoleon, MD;  Location: [facility] OR;  Service: Plastics;  Laterality: N/A;  PATIENT'S HOME WOUND VAC APPLIED.   BIOPSY  12/23/2018   Procedure: BIOPSY;  Surgeon: Meridee Score Netty Starring., MD;  Location: [facility] ENDOSCOPY;  Service: Gastroenterology;;   BIOPSY  01/23/2020   Procedure: BIOPSY;  Surgeon: Lemar Lofty., MD;  Location[facility];  Service: Gastroenterology;;   CARDIAC CATHETERIZATION  06/2006   Angiographically normal cors   CARDIAC CATHETERIZATION  12/22/2011   R/LHC: normal cors, well-compensated HDs, LV dysfxn   CARDIOVERSION N/A 10/17/2016   Procedure: CARDIOVERSION;  Surgeon: Dolores Patty, MD;  Location: [facility] ENDOSCOPY;  Service: Cardiovascular;  Laterality: N/A;   CARDIOVERSION N/A 03/20/2017   Procedure: CARDIOVERSION;  Surgeon: Dolores Patty, MD;  Location: [facility] ENDOSCOPY;  Service: Cardiovascular;  Laterality: N/A;   COLECTOMY N/A 12/28/2018   Procedure: TOTAL COLECTOMY;  Surgeon: Emelia Loron, MD;  Location: [facility] OR;  Service: General;  Laterality: N/A;   FLEXIBLE SIGMOIDOSCOPY N/A 12/23/2018   Procedure: FLEXIBLE SIGMOIDOSCOPY;  Surgeon: Lemar Lofty., MD;  Location: [facility] ENDOSCOPY;  Service: Gastroenterology;  Laterality: N/A;   FLEXIBLE SIGMOIDOSCOPY N/A 01/23/2020   Procedure: FLEXIBLE SIGMOIDOSCOPY;  Surgeon: Meridee Score Netty Starring., MD;  Location: [facility] ENDOSCOPY;  Service: Gastroenterology;  Laterality: N/A;   ICD IMPLANT N/A 09/03/2018   Procedure: ICD Medtronic IMPLANT;  Surgeon: Regan Lemming, MD;  Location: [facility] INVASIVE CV LAB;  Service: Cardiovascular;  Laterality: N/A;   ILEOSTOMY N/A 12/28/2018   Procedure: ILEOSTOMY;  Surgeon: Emelia Loron, MD;  Location: [facility] OR;  Service: General;  Laterality: N/A;   IR RADIOLOGIST EVAL & MGMT  02/08/2019   JOINT REPLACEMENT     right knee   LEFT AND RIGHT HEART CATHETERIZATION WITH CORONARY ANGIOGRAM N/A 12/22/2011   Procedure: LEFT AND RIGHT HEART CATHETERIZATION WITH CORONARY ANGIOGRAM;  Surgeon: Dolores Patty, MD;  Location: [facility] CATH LAB;  Service: Cardiovascular;  Laterality: N/A;   PATELLAR TENDON REPAIR     Left   RIGHT HEART CATH N/A 10/20/2016   Procedure: RIGHT HEART CATH;  Surgeon: Dolores Patty, MD;  Location: [facility] INVASIVE CV LAB;  Service: Cardiovascular;  Laterality: N/A;   RIGHT HEART CATH N/A 04/17/2020   Procedure: RIGHT HEART CATH;  Surgeon: Dolores Patty, MD;  Location: [facility] INVASIVE CV LAB;  Service: Cardiovascular;  Laterality: N/A;   SKIN SPLIT GRAFT N/A 09/05/2019   Procedure: SKIN GRAFT SPLIT THICKNESS;  Surgeon: Allena Napoleon, MD;  Location: [facility] OR;  Service:  Plastics;  Laterality: N/A;   TEE WITHOUT CARDIOVERSION N/A 03/20/2017   Procedure: TRANSESOPHAGEAL ECHOCARDIOGRAM (TEE);  Surgeon: Dolores Patty, MD;  Location: [facility] ENDOSCOPY;  Service: Cardiovascular;  Laterality: N/A;     Current Outpatient Medications  Medication Sig Dispense Refill   amiodarone (PACERONE) 200 MG tablet Take 200 mg by mouth 2 (two) times daily.     carvedilol (COREG) 6.25 MG tablet Take 1 tablet (6.25 mg total) by mouth 2 (two) times daily with a meal. 60 tablet 3   dapagliflozin propanediol (FARXIGA) 10 MG TABS tablet Take 1 tablet (10 mg total) by mouth daily. 30 tablet 3   digoxin (LANOXIN) 0.125 MG tablet Take 0.5 tablets (0.0625 mg total) by mouth daily. 30 tablet 3   ELIQUIS 5 MG TABS tablet TAKE 1 TABLET(5 MG) BY MOUTH TWICE DAILY 60 tablet 3   mexiletine (MEXITIL) 150 MG capsule Take 2 capsules (300 mg total) by mouth every 12 (twelve) hours. 120 capsule 3   pantoprazole (PROTONIX) 40 MG tablet Take 1 tablet (40 mg total) by mouth daily. 90 tablet 3   potassium chloride SA (KLOR-CON) 20 MEQ tablet Take 2  tablets (40 mEq total) by mouth daily. 60 tablet 3   ranolazine (RANEXA) 500 MG 12 hr tablet Take 1 tablet (500 mg total) by mouth 2 (two) times daily. 60 tablet 3   spironolactone (ALDACTONE) 25 MG tablet Take 2 tablets (50 mg total) by mouth daily. 60 tablet 3   torsemide (DEMADEX) 20 MG tablet Take 2 tablets (40 mg total) by mouth 2 (two) times daily. 60 tablet 2   No current facility-administered medications for this visit.    Allergies:   Shrimp [shellfish allergy]   Social History:  The patient  reports that he has never smoked. He has never used smokeless tobacco. He reports previous alcohol use. He reports that he does not use drugs.   Family History:  [name] family history includes Cancer in his mother; Clotting disorder in his mother; Diabetes in his father, sister, and another family member; Heart attack in his father; Hypertension in his  mother and another family member; Obesity in his sister, sister and another family member.   ROS:  Please see the history of present illness.   Otherwise, review of systems is positive for none.   All other systems are reviewed and negative.   PHYSICAL EXAM: VS:  BP 110/70   Pulse 82   Ht 5\' 8"  (1.727 m)   Wt (!) 341 lb (154.7 kg)   SpO2 96%   BMI 51.85 kg/m  , BMI Body mass index is 51.85 kg/m. GEN: Well nourished, well developed, in no acute distress  HEENT: normal  Neck: no JVD, carotid bruits, or masses Cardiac: RRR; no murmurs, rubs, or gallops,no edema  Respiratory:  clear to auscultation bilaterally, normal work of breathing GI: soft, nontender, nondistended, + BS MS: no deformity or atrophy  Skin: warm and dry, device site well healed Neuro:  Strength and sensation are intact Psych: euthymic mood, full affect  EKG:  EKG is not ordered today. Personal review of the ekg ordered 06/06/20 shows sinus rhythm, first-degree AV block, PVCs  Personal review of the device interrogation today. Results in [facility]    Recent Labs: 06/01/2020: B Natriuretic Peptide 773.7 06/10/2020: Magnesium 2.6; TSH 1.340 06/22/2020: ALT 25; BUN 22; Creatinine, Ser 2.21; Hemoglobin 17.5; Platelets 185; Potassium 3.9; Sodium 134    Lipid Panel     Component Value Date/Time   CHOL 107 (L) 09/18/2015 1013   TRIG 58 01/17/2019 0616   HDL 40 09/18/2015 1013   CHOLHDL 2.7 09/18/2015 1013   VLDL 14 09/18/2015 1013   LDLCALC 53 09/18/2015 1013     Wt Readings from Last 3 Encounters:  07/06/20 (!) 341 lb (154.7 kg)  06/22/20 (!) 337 lb 3.2 oz (153 kg)  06/10/20 (!) 331 lb 11.2 oz (150.5 kg)      Other studies Reviewed: Additional studies/ records that were reviewed today include: TTE 12/14/2019  Review of the above records today demonstrates:   1. Left ventricular ejection fraction, by estimation, is 25 to 30%. The  left ventricle has severely decreased function. Left ventricular  endocardial  border not optimally defined to evaluate regional wall motion.  The left ventricular internal cavity size   was moderately dilated. There is mild left ventricular hypertrophy. Left  ventricular diastolic parameters are consistent with Grade III diastolic  dysfunction (restrictive).   2. Right ventricular systolic function is normal. The right ventricular  size is normal.   3. The mitral valve is normal in structure. No evidence of mitral valve  regurgitation. No evidence of  mitral stenosis.   4. The aortic valve is normal in structure. Aortic valve regurgitation is  not visualized. No aortic stenosis is present.   5. The inferior vena cava is normal in size with greater than 50%  respiratory variability, suggesting right atrial pressure of 3 mmHg.     ASSESSMENT AND PLAN:  1.  Chronic systolic heart failure: Currently on optimal medical therapy.  Status post Medtronic ICD implanted 09/03/2018.  Device functioning appropriately.  No changes.    2.  Typical atrial flutter: Status post ablation 11/17/2016.  He has had an atypical atrial flutter and is now on both amiodarone and Eliquis.  CHA2DS2-VASc of 1.  High risk medication monitoring.  Remained in sinus rhythm.   3.  Morbid obesity: Weight loss encouraged  4.  DVT/PE: Currently on Eliquis per primary cardiology and primary physician  5.  Ventricular tachycardia: Was admitted to the hospital with VT storm.  Has been had a reload of amiodarone and has been started on mexiletine.  He has had no further ventricular arrhythmias.  We Quamaine Webb continue with current management.  Current medicines are reviewed at length with the patient today.   The patient does not have concerns regarding his medicines.  The following changes were made today: None  Labs/ tests ordered today include:  No orders of the defined types were placed in this encounter.   Disposition:   FU with Khamron Gellert 6 months  Signed, Debbie Bellucci Jorja Loa, MD  07/06/2020 4:36  PM     [facility] [address] [place] [address] [phone] (office) [phone] (fax)

## 2020-07-06 NOTE — Patient Instructions (Signed)
Medication Instructions:  Your physician recommends that you continue on your current medications as directed. Please refer to the Current Medication list given to you today.  *If you need a refill on your cardiac medications before your next appointment, please call your pharmacy*   Lab Work: None ordered   Testing/Procedures: None ordered   Follow-Up: At [facility], you and your health needs are our priority.  As part of our continuing mission to provide you with exceptional heart care, we have created designated Provider Care Teams.  These Care Teams include your primary Cardiologist (physician) and Advanced Practice Providers (APPs -  Physician Assistants and Nurse Practitioners) who all work together to provide you with the care you need, when you need it.  Remote monitoring is used to monitor your Pacemaker or ICD from home. This monitoring reduces the number of office visits required to check your device to one time per year. It allows [place] to keep an eye on the functioning of your device to ensure it is working properly. You are scheduled for a device check from home on 07/27/2020. You may send your transmission at any time that day. If you have a wireless device, the transmission will be sent automatically. After your physician reviews your transmission, you will receive a postcard with your next transmission date.  Your next appointment:   6 month(s)  The format for your next appointment:   In Person  Provider:   Loman Brooklyn, MD   Thank you for choosing [facility]!!   Dory Horn, RN [phone]

## 2020-07-09 ENCOUNTER — Telehealth (HOSPITAL_COMMUNITY): Payer: Self-pay

## 2020-07-11 ENCOUNTER — Other Ambulatory Visit (HOSPITAL_COMMUNITY): Payer: Self-pay | Admitting: *Deleted

## 2020-07-11 MED ORDER — POTASSIUM CHLORIDE CRYS ER 20 MEQ PO TBCR: 40.0000 meq | EXTENDED_RELEASE_TABLET | Freq: Every day | ORAL | 3 refills | Status: AC

## 2020-07-18 ENCOUNTER — Telehealth (HOSPITAL_COMMUNITY): Payer: Self-pay | Admitting: *Deleted

## 2020-07-18 NOTE — Telephone Encounter (Signed)
Pt called c/o increased shortness of breath when laying down. Pt is sleeping on 3 pillows and said it feels like he can't get good air laying down. Pt said he has swelling in his feet but his weight is stable. No other complaints at this time.    Routed to Amy Clegg,NP for advice

## 2020-07-18 NOTE — Telephone Encounter (Signed)
Pt has office visit tomorrow 7/7.

## 2020-07-19 ENCOUNTER — Observation Stay (HOSPITAL_BASED_OUTPATIENT_CLINIC_OR_DEPARTMENT_OTHER): Payer: BC Managed Care – PPO

## 2020-07-19 ENCOUNTER — Observation Stay (HOSPITAL_COMMUNITY)
Admission: EM | Admit: 2020-07-19 | Discharge: 2020-07-21 | Disposition: A | Discharge status: Home / Self Care | Payer: BC Managed Care – PPO | Attending: Internal Medicine | Admitting: Internal Medicine

## 2020-07-19 ENCOUNTER — Encounter (HOSPITAL_COMMUNITY): Payer: Self-pay | Admitting: Emergency Medicine

## 2020-07-19 ENCOUNTER — Other Ambulatory Visit: Payer: Self-pay

## 2020-07-19 ENCOUNTER — Encounter (HOSPITAL_COMMUNITY): Payer: BC Managed Care – PPO

## 2020-07-19 ENCOUNTER — Emergency Department (HOSPITAL_COMMUNITY): Payer: BC Managed Care – PPO

## 2020-07-19 DIAGNOSIS — Z79899 Other long term (current) drug therapy: Secondary | ICD-10-CM | POA: Diagnosis not present

## 2020-07-19 DIAGNOSIS — R609 Edema, unspecified: Secondary | ICD-10-CM | POA: Diagnosis not present

## 2020-07-19 DIAGNOSIS — I472 Ventricular tachycardia, unspecified: Secondary | ICD-10-CM

## 2020-07-19 DIAGNOSIS — Z933 Colostomy status: Secondary | ICD-10-CM | POA: Diagnosis not present

## 2020-07-19 DIAGNOSIS — Z6841 Body Mass Index (BMI) 40.0 and over, adult: Secondary | ICD-10-CM | POA: Insufficient documentation

## 2020-07-19 DIAGNOSIS — R0602 Shortness of breath: Secondary | ICD-10-CM | POA: Diagnosis present

## 2020-07-19 DIAGNOSIS — I428 Other cardiomyopathies: Secondary | ICD-10-CM | POA: Diagnosis present

## 2020-07-19 DIAGNOSIS — Z96651 Presence of right artificial knee joint: Secondary | ICD-10-CM | POA: Diagnosis present

## 2020-07-19 DIAGNOSIS — I2510 Atherosclerotic heart disease of native coronary artery without angina pectoris: Secondary | ICD-10-CM | POA: Insufficient documentation

## 2020-07-19 DIAGNOSIS — I872 Venous insufficiency (chronic) (peripheral): Secondary | ICD-10-CM | POA: Diagnosis present

## 2020-07-19 DIAGNOSIS — I509 Heart failure, unspecified: Secondary | ICD-10-CM

## 2020-07-19 DIAGNOSIS — Z20822 Contact with and (suspected) exposure to covid-19: Secondary | ICD-10-CM | POA: Insufficient documentation

## 2020-07-19 DIAGNOSIS — I484 Atypical atrial flutter: Secondary | ICD-10-CM | POA: Insufficient documentation

## 2020-07-19 DIAGNOSIS — I4819 Other persistent atrial fibrillation: Secondary | ICD-10-CM

## 2020-07-19 DIAGNOSIS — Z7901 Long term (current) use of anticoagulants: Secondary | ICD-10-CM | POA: Diagnosis not present

## 2020-07-19 DIAGNOSIS — D6862 Lupus anticoagulant syndrome: Secondary | ICD-10-CM | POA: Diagnosis present

## 2020-07-19 DIAGNOSIS — Z95810 Presence of automatic (implantable) cardiac defibrillator: Secondary | ICD-10-CM | POA: Diagnosis not present

## 2020-07-19 DIAGNOSIS — R748 Abnormal levels of other serum enzymes: Secondary | ICD-10-CM

## 2020-07-19 DIAGNOSIS — Z86711 Personal history of pulmonary embolism: Secondary | ICD-10-CM | POA: Insufficient documentation

## 2020-07-19 DIAGNOSIS — Z8249 Family history of ischemic heart disease and other diseases of the circulatory system: Secondary | ICD-10-CM

## 2020-07-19 DIAGNOSIS — Z86718 Personal history of other venous thrombosis and embolism: Secondary | ICD-10-CM | POA: Diagnosis not present

## 2020-07-19 DIAGNOSIS — R799 Abnormal finding of blood chemistry, unspecified: Secondary | ICD-10-CM

## 2020-07-19 DIAGNOSIS — Z9049 Acquired absence of other specified parts of digestive tract: Secondary | ICD-10-CM

## 2020-07-19 DIAGNOSIS — I5043 Acute on chronic combined systolic (congestive) and diastolic (congestive) heart failure: Principal | ICD-10-CM | POA: Insufficient documentation

## 2020-07-19 DIAGNOSIS — I471 Supraventricular tachycardia: Secondary | ICD-10-CM | POA: Diagnosis present

## 2020-07-19 DIAGNOSIS — N1832 Chronic kidney disease, stage 3b: Secondary | ICD-10-CM | POA: Diagnosis not present

## 2020-07-19 DIAGNOSIS — Z91013 Allergy to seafood: Secondary | ICD-10-CM

## 2020-07-19 DIAGNOSIS — Z7984 Long term (current) use of oral hypoglycemic drugs: Secondary | ICD-10-CM

## 2020-07-19 DIAGNOSIS — I248 Other forms of acute ischemic heart disease: Secondary | ICD-10-CM | POA: Diagnosis present

## 2020-07-19 DIAGNOSIS — Z8679 Personal history of other diseases of the circulatory system: Secondary | ICD-10-CM

## 2020-07-19 DIAGNOSIS — I5023 Acute on chronic systolic (congestive) heart failure: Secondary | ICD-10-CM | POA: Diagnosis not present

## 2020-07-19 DIAGNOSIS — Z939 Artificial opening status, unspecified: Secondary | ICD-10-CM

## 2020-07-19 LAB — CBC WITH DIFFERENTIAL/PLATELET
Abs Immature Granulocytes: 0.16 10*3/uL — ABNORMAL HIGH (ref 0.00–0.07)
Basophils Absolute: 0.1 10*3/uL (ref 0.0–0.1)
Basophils Relative: 1 %
Eosinophils Absolute: 0.1 10*3/uL (ref 0.0–0.5)
Eosinophils Relative: 1 %
HCT: 47.4 % (ref 39.0–52.0)
Hemoglobin: 15.7 g/dL (ref 13.0–17.0)
Immature Granulocytes: 2 %
Lymphocytes Relative: 18 %
Lymphs Abs: 1.6 10*3/uL (ref 0.7–4.0)
MCH: 30.8 pg (ref 26.0–34.0)
MCHC: 33.1 g/dL (ref 30.0–36.0)
MCV: 92.9 fL (ref 80.0–100.0)
Monocytes Absolute: 0.8 10*3/uL (ref 0.1–1.0)
Monocytes Relative: 9 %
Neutro Abs: 6.5 10*3/uL (ref 1.7–7.7)
Neutrophils Relative %: 69 %
Platelets: 213 10*3/uL (ref 150–400)
RBC: 5.1 MIL/uL (ref 4.22–5.81)
RDW: 14.6 % (ref 11.5–15.5)
WBC: 9.3 10*3/uL (ref 4.0–10.5)
nRBC: 0 % (ref 0.0–0.2)

## 2020-07-19 LAB — BASIC METABOLIC PANEL
Anion gap: 13 (ref 5–15)
BUN: 17 mg/dL (ref 6–20)
CO2: 21 mmol/L — ABNORMAL LOW (ref 22–32)
Calcium: 8.5 mg/dL — ABNORMAL LOW (ref 8.9–10.3)
Chloride: 100 mmol/L (ref 98–111)
Creatinine, Ser: 2.23 mg/dL — ABNORMAL HIGH (ref 0.61–1.24)
GFR, Estimated: 34 mL/min — ABNORMAL LOW (ref >60)
Glucose, Bld: 137 mg/dL — ABNORMAL HIGH (ref 70–99)
Potassium: 3.6 mmol/L (ref 3.5–5.1)
Sodium: 134 mmol/L — ABNORMAL LOW (ref 135–145)

## 2020-07-19 LAB — RESP PANEL BY RT-PCR (FLU A&B, COVID) ARPGX2
Influenza A by PCR: NEGATIVE
Influenza B by PCR: NEGATIVE
SARS Coronavirus 2 by RT PCR: NEGATIVE

## 2020-07-19 LAB — BRAIN NATRIURETIC PEPTIDE: B Natriuretic Peptide: 959.9 pg/mL — ABNORMAL HIGH (ref 0.0–100.0)

## 2020-07-19 LAB — MAGNESIUM: Magnesium: 2 mg/dL (ref 1.7–2.4)

## 2020-07-19 LAB — POTASSIUM: Potassium: 4.1 mmol/L (ref 3.5–5.1)

## 2020-07-19 LAB — TROPONIN I (HIGH SENSITIVITY): Troponin I (High Sensitivity): 46 ng/L — ABNORMAL HIGH (ref <18)

## 2020-07-19 MED ORDER — PANTOPRAZOLE SODIUM 40 MG PO TBEC
40.0000 mg | DELAYED_RELEASE_TABLET | Freq: Every day | ORAL | Status: DC
  Administered 2020-07-19 – 2020-07-21 (×3): 40 mg via ORAL
  Filled 2020-07-19 (×3): qty 1

## 2020-07-19 MED ORDER — CARVEDILOL 3.125 MG PO TABS
3.1250 mg | ORAL_TABLET | Freq: Two times a day (BID) | ORAL | Status: DC
  Administered 2020-07-19 – 2020-07-21 (×4): 3.125 mg via ORAL
  Filled 2020-07-19 (×4): qty 1

## 2020-07-19 MED ORDER — RANOLAZINE ER 500 MG PO TB12
500.0000 mg | ORAL_TABLET | Freq: Two times a day (BID) | ORAL | Status: DC
  Administered 2020-07-19 – 2020-07-21 (×5): 500 mg via ORAL
  Filled 2020-07-19 (×6): qty 1

## 2020-07-19 MED ORDER — SODIUM CHLORIDE 0.9% FLUSH: 3.0000 mL | INTRAVENOUS | Status: DC | PRN

## 2020-07-19 MED ORDER — ACETAMINOPHEN 325 MG PO TABS: 650.0000 mg | ORAL_TABLET | ORAL | Status: DC | PRN

## 2020-07-19 MED ORDER — ONDANSETRON HCL 4 MG/2ML IJ SOLN
4.0000 mg | Freq: Four times a day (QID) | INTRAMUSCULAR | Status: DC | PRN
  Administered 2020-07-20: 4 mg via INTRAVENOUS
  Filled 2020-07-19: qty 2

## 2020-07-19 MED ORDER — SODIUM CHLORIDE 0.9 % IV SOLN: 250.0000 mL | INTRAVENOUS | Status: DC | PRN

## 2020-07-19 MED ORDER — AMIODARONE HCL 200 MG PO TABS
200.0000 mg | ORAL_TABLET | Freq: Two times a day (BID) | ORAL | Status: DC
  Administered 2020-07-19 – 2020-07-21 (×5): 200 mg via ORAL
  Filled 2020-07-19 (×5): qty 1

## 2020-07-19 MED ORDER — APIXABAN 5 MG PO TABS
5.0000 mg | ORAL_TABLET | Freq: Two times a day (BID) | ORAL | Status: DC
  Administered 2020-07-19 – 2020-07-21 (×5): 5 mg via ORAL
  Filled 2020-07-19 (×5): qty 1

## 2020-07-19 MED ORDER — FUROSEMIDE 10 MG/ML IJ SOLN: 40.0000 mg | Freq: Once | INTRAMUSCULAR | Status: DC

## 2020-07-19 MED ORDER — MEXILETINE HCL 150 MG PO CAPS
300.0000 mg | ORAL_CAPSULE | Freq: Two times a day (BID) | ORAL | Status: DC
  Administered 2020-07-19 – 2020-07-21 (×5): 300 mg via ORAL
  Filled 2020-07-19 (×6): qty 2

## 2020-07-19 MED ORDER — DIGOXIN 125 MCG PO TABS
0.0625 mg | ORAL_TABLET | Freq: Every day | ORAL | Status: DC
  Administered 2020-07-19 – 2020-07-21 (×3): 0.0625 mg via ORAL
  Filled 2020-07-19 (×3): qty 1

## 2020-07-19 MED ORDER — FUROSEMIDE 10 MG/ML IJ SOLN
80.0000 mg | Freq: Once | INTRAMUSCULAR | Status: AC
  Administered 2020-07-19: 80 mg via INTRAVENOUS
  Filled 2020-07-19: qty 8

## 2020-07-19 MED ORDER — SPIRONOLACTONE 25 MG PO TABS
50.0000 mg | ORAL_TABLET | Freq: Every day | ORAL | Status: DC
  Administered 2020-07-19 – 2020-07-21 (×3): 50 mg via ORAL
  Filled 2020-07-19 (×3): qty 2

## 2020-07-19 MED ORDER — POTASSIUM CHLORIDE CRYS ER 20 MEQ PO TBCR
40.0000 meq | EXTENDED_RELEASE_TABLET | Freq: Every day | ORAL | Status: DC
  Administered 2020-07-19 – 2020-07-21 (×3): 40 meq via ORAL
  Filled 2020-07-19 (×3): qty 2

## 2020-07-19 MED ORDER — FUROSEMIDE 10 MG/ML IJ SOLN
80.0000 mg | Freq: Two times a day (BID) | INTRAMUSCULAR | Status: DC
  Administered 2020-07-19 – 2020-07-21 (×4): 80 mg via INTRAVENOUS
  Filled 2020-07-19 (×4): qty 8

## 2020-07-19 MED ORDER — DAPAGLIFLOZIN PROPANEDIOL 10 MG PO TABS
10.0000 mg | ORAL_TABLET | Freq: Every day | ORAL | Status: DC
  Administered 2020-07-19 – 2020-07-21 (×3): 10 mg via ORAL
  Filled 2020-07-19 (×3): qty 1

## 2020-07-19 MED ORDER — SODIUM CHLORIDE 0.9% FLUSH
3.0000 mL | Freq: Two times a day (BID) | INTRAVENOUS | Status: DC
  Administered 2020-07-19 – 2020-07-21 (×5): 3 mL via INTRAVENOUS

## 2020-07-19 MED ORDER — CARVEDILOL 3.125 MG PO TABS
6.2500 mg | ORAL_TABLET | Freq: Two times a day (BID) | ORAL | Status: DC
  Administered 2020-07-19: 6.25 mg via ORAL
  Filled 2020-07-19: qty 2

## 2020-07-19 NOTE — H&P (Signed)
History and Physical    Roberto [name] IFO:[id number] DOB: 1965-12-08 DOA: 07/19/2020  PCP: [name], MD  Patient coming from: Home  I have personally briefly reviewed patient's old medical records in [facility]  Chief Complaint: SOB  HPI: Roberto [name] is a 55 y.o. 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  Pt presents to ED with c/o SOB.  Intermittent SOB for past 3 days.  SOB worse at night.  Symptoms when he tries to sleep in recliner or bed.  Associated orthopnea worse than baseline.  Swelling in abdomen worse than baseline.  Has chronic swelling in legs as well.  Denies CP, dizziness, fever, chills, N/V/D.  Has nonproductive cough with the SOB.  Says it feels "like I have extra fluid".   ED Course: ED work up is indeed consistent with decompensated CHF: Pulm edema on X ray, BNP 959 (highest ever recorded), no SIRS.  Trop 46 (about baseline).  Creat 2.23 (baseline).  Given 80mg  IV lasix in ED, hospitalist asked to admit and cards consulted.   Review of Systems: As per HPI, otherwise all review of systems negative.  Past Medical History:  Diagnosis Date   AICD (automatic cardioverter/defibrillator) present 08/2018   Medtronic ICD   Arthritis    knee   Chronic combined systolic and diastolic CHF (congestive heart failure) (HCC)    Chronic venous insufficiency    Coronary artery disease    Deep vein thrombosis (HCC) 2007   left leg   Diverticulitis    c/b perforated colon and nonhealing abdominal wound s/p skin grafting, ileostomy   Hepatitis    patient denies this dx   Lupus anticoagulant positive    Morbid obesity (HCC)    Morbid obesity with BMI of 50.0-59.9, adult (HCC)    Mural thrombus of heart    coumadin    Nonischemic cardiomyopathy (HCC)    a) 12/17/11 echo: LVEF 25-30%, grade 3 diastolic dysfunction (c/w restriction), mod MR, mod LA/LV and mild RA dilatation; b) 12/18/11 cMRI: LVEF 38%, mod LV/mild RV dilatation, global HK, mild-mod RV sys dysfxn, no LV thrombus & patchy non-subendocardial delayed enhancement c/w infil dz or prior myocarditis; c. 07/2018 EF 25-30%.   Paroxysmal SVT (supraventricular tachycardia) (HCC)    Pneumonia    x 2   Pulmonary embolism (HCC)    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   Ventricular tachyarrhythmia [facility])    Prior VT/VF   Wears glasses    Wound of abdomen     Past Surgical History:  Procedure Laterality Date   A-FLUTTER ABLATION N/A 11/17/2016   Procedure: A-FLUTTER ABLATION;  Surgeon: 13/05/2016, MD;  Location: [facility] INVASIVE CV LAB;  Service: Cardiovascular;  Laterality: N/A;   APPLICATION OF WOUND VAC N/A 09/05/2019   Procedure: APPLICATION OF WOUND VAC;  Surgeon: 09/07/2019, MD;  Location: [facility] OR;  Service: Plastics;  Laterality: N/A;  PATIENT'S HOME WOUND VAC APPLIED.   BIOPSY  12/23/2018   Procedure: BIOPSY;  Surgeon: 14/10/2018 Meridee Score., MD;  Location: [facility] ENDOSCOPY;  Service: Gastroenterology;;   BIOPSY  01/23/2020   Procedure: BIOPSY;  Surgeon: 03/22/2020., MD;  Location: [facility] ENDOSCOPY;  Service: Gastroenterology;;   CARDIAC CATHETERIZATION  06/2006   Angiographically normal  cors   CARDIAC CATHETERIZATION  12/22/2011   R/LHC: normal cors, well-compensated HDs, LV dysfxn   CARDIOVERSION N/A 10/17/2016   Procedure: CARDIOVERSION;  Surgeon: Dolores Patty, MD;  Location: [facility] ENDOSCOPY;  Service: Cardiovascular;  Laterality: N/A;   CARDIOVERSION N/A 03/20/2017   Procedure: CARDIOVERSION;  Surgeon: Dolores Patty, MD;  Location: [facility] ENDOSCOPY;  Service: Cardiovascular;  Laterality: N/A;   COLECTOMY N/A 12/28/2018   Procedure: TOTAL COLECTOMY;  Surgeon: Emelia Loron, MD;  Location: [facility] OR;  Service: General;   Laterality: N/A;   FLEXIBLE SIGMOIDOSCOPY N/A 12/23/2018   Procedure: FLEXIBLE SIGMOIDOSCOPY;  Surgeon: Lemar Lofty., MD;  Location: [facility] ENDOSCOPY;  Service: Gastroenterology;  Laterality: N/A;   FLEXIBLE SIGMOIDOSCOPY N/A 01/23/2020   Procedure: FLEXIBLE SIGMOIDOSCOPY;  Surgeon: Meridee Score Netty Starring., MD;  Location: [facility] ENDOSCOPY;  Service: Gastroenterology;  Laterality: N/A;   ICD IMPLANT N/A 09/03/2018   Procedure: ICD Medtronic IMPLANT;  Surgeon: Regan Lemming, MD;  Location: [facility] INVASIVE CV LAB;  Service: Cardiovascular;  Laterality: N/A;   ILEOSTOMY N/A 12/28/2018   Procedure: ILEOSTOMY;  Surgeon: Emelia Loron, MD;  Location: [facility] OR;  Service: General;  Laterality: N/A;   IR RADIOLOGIST EVAL & MGMT  02/08/2019   JOINT REPLACEMENT     right knee   LEFT AND RIGHT HEART CATHETERIZATION WITH CORONARY ANGIOGRAM N/A 12/22/2011   Procedure: LEFT AND RIGHT HEART CATHETERIZATION WITH CORONARY ANGIOGRAM;  Surgeon: Dolores Patty, MD;  Location: [facility] CATH LAB;  Service: Cardiovascular;  Laterality: N/A;   PATELLAR TENDON REPAIR     Left   RIGHT HEART CATH N/A 10/20/2016   Procedure: RIGHT HEART CATH;  Surgeon: Dolores Patty, MD;  Location: [facility] INVASIVE CV LAB;  Service: Cardiovascular;  Laterality: N/A;   RIGHT HEART CATH N/A 04/17/2020   Procedure: RIGHT HEART CATH;  Surgeon: Dolores Patty, MD;  Location: [facility] INVASIVE CV LAB;  Service: Cardiovascular;  Laterality: N/A;   SKIN SPLIT GRAFT N/A 09/05/2019   Procedure: SKIN GRAFT SPLIT THICKNESS;  Surgeon: Allena Napoleon, MD;  Location: [facility] OR;  Service: Plastics;  Laterality: N/A;   TEE WITHOUT CARDIOVERSION N/A 03/20/2017   Procedure: TRANSESOPHAGEAL ECHOCARDIOGRAM (TEE);  Surgeon: Dolores Patty, MD;  Location: [facility] ENDOSCOPY;  Service: Cardiovascular;  Laterality: N/A;     reports that he has never smoked. He has never used smokeless tobacco. He reports previous alcohol use. He reports that he does not use  drugs.  Allergies  Allergen Reactions   Shrimp [Shellfish Allergy] Hives and Itching    Family History  Problem Relation Age of Onset   Hypertension Mother    Clotting disorder Mother        mom with PEs   Cancer Mother        uterine cancer   Diabetes Father    Heart attack Father    Hypertension Other        Sibling   Diabetes Other    Obesity Other    Diabetes Sister    Obesity Sister    Obesity Sister    Colon cancer Neg Hx    Stomach cancer Neg Hx    Esophageal cancer Neg Hx    Rectal cancer Neg Hx    Inflammatory bowel disease Neg Hx    Liver disease Neg Hx    Pancreatic cancer Neg Hx      Prior to Admission medications   Medication Sig Start Date End Date Taking? Authorizing Provider  amiodarone (PACERONE) 200 MG tablet  Take 200 mg by mouth 2 (two) times daily.   Yes [provider]  carvedilol (COREG) 6.25 MG tablet Take 1 tablet (6.25 mg total) by mouth 2 (two) times daily with a meal. 06/10/20  Yes Duke SalviaKlein, Steven C, MD  dapagliflozin propanediol (FARXIGA) 10 MG TABS tablet Take 1 tablet (10 mg total) by mouth daily. 05/29/20  Yes Laurey MoraleMcLean, Dalton S, MD  digoxin (LANOXIN) 0.125 MG tablet Take 0.5 tablets (0.0625 mg total) by mouth daily. 06/22/20  Yes Bensimhon, Bevelyn Bucklesaniel R, MD  ELIQUIS 5 MG TABS tablet TAKE 1 TABLET(5 MG) BY MOUTH TWICE DAILY Patient taking differently: Take 5 mg by mouth 2 (two) times daily. 04/27/20  Yes Robbie LisSimmons, Brittainy M, PA-C  mexiletine (MEXITIL) 150 MG capsule Take 2 capsules (300 mg total) by mouth every 12 (twelve) hours. 06/10/20  Yes Duke SalviaKlein, Steven C, MD  pantoprazole (PROTONIX) 40 MG tablet Take 1 tablet (40 mg total) by mouth daily. 06/22/20  Yes Bensimhon, Bevelyn Bucklesaniel R, MD  potassium chloride SA (KLOR-CON) 20 MEQ tablet Take 2 tablets (40 mEq total) by mouth daily. 07/11/20  Yes Bensimhon, Bevelyn Bucklesaniel R, MD  ranolazine (RANEXA) 500 MG 12 hr tablet Take 1 tablet (500 mg total) by mouth 2 (two) times daily. 06/10/20  Yes Duke SalviaKlein, Steven C, MD   spironolactone (ALDACTONE) 25 MG tablet Take 2 tablets (50 mg total) by mouth daily. 06/10/20  Yes Duke SalviaKlein, Steven C, MD  torsemide (DEMADEX) 20 MG tablet Take 2 tablets (40 mg total) by mouth 2 (two) times daily. 05/24/20  Yes Melene PlanFloyd, Dan, DO    Physical Exam: Vitals:   07/19/20 0221 07/19/20 0355  BP: 121/75 102/70  Pulse: 79 62  Resp: 18 20  Temp: 98.4 F (36.9 C) 98.5 F (36.9 C)  TempSrc: Oral Oral  SpO2: 94% 94%    Constitutional: NAD, calm, comfortable Eyes: PERRL, lids and conjunctivae normal ENMT: Mucous membranes are moist. Posterior pharynx clear of any exudate or lesions.Normal dentition.  Neck: normal, supple, no masses, no thyromegaly Respiratory: Rhonchi B Bases, no accessory muscle usage  Cardiovascular: Regular rate and rhythm, no murmurs / rubs / gallops. 3+ Pitting edema BLE. 2+ pedal pulses. No carotid bruits.  Abdomen: no tenderness, no masses palpated. No hepatosplenomegaly. Bowel sounds positive.  Musculoskeletal: no clubbing / cyanosis. No joint deformity upper and lower extremities. Good ROM, no contractures. Normal muscle tone.  Skin: no rashes, lesions, ulcers. No induration Neurologic: CN 2-12 grossly intact. Sensation intact, DTR normal. Strength 5/5 in all 4.  Psychiatric: Normal judgment and insight. Alert and oriented x 3. Normal mood.    Labs on Admission: I have personally reviewed following labs and imaging studies  CBC: Recent Labs  Lab 07/19/20 0222  WBC 9.3  NEUTROABS 6.5  HGB 15.7  HCT 47.4  MCV 92.9  PLT 213   Basic Metabolic Panel: Recent Labs  Lab 07/19/20 0222  NA 134*  K 3.6  CL 100  CO2 21*  GLUCOSE 137*  BUN 17  CREATININE 2.23*  CALCIUM 8.5*  MG 2.0   GFR: Estimated Creatinine Clearance: 55.1 mL/min (A) (by C-G formula based on SCr of 2.23 mg/dL (H)). Liver Function Tests: No results for input(s): AST, ALT, ALKPHOS, BILITOT, PROT, ALBUMIN in the last 168 hours. No results for input(s): LIPASE, AMYLASE in the last  168 hours. No results for input(s): AMMONIA in the last 168 hours. Coagulation Profile: No results for input(s): INR, PROTIME in the last 168 hours. Cardiac Enzymes: No results for input(s): CKTOTAL, CKMB,  CKMBINDEX, TROPONINI in the last 168 hours. BNP (last 3 results) No results for input(s): PROBNP in the last 8760 hours. HbA1C: No results for input(s): HGBA1C in the last 72 hours. CBG: No results for input(s): GLUCAP in the last 168 hours. Lipid Profile: No results for input(s): CHOL, HDL, LDLCALC, TRIG, CHOLHDL, LDLDIRECT in the last 72 hours. Thyroid Function Tests: No results for input(s): TSH, T4TOTAL, FREET4, T3FREE, THYROIDAB in the last 72 hours. Anemia Panel: No results for input(s): VITAMINB12, FOLATE, FERRITIN, TIBC, IRON, RETICCTPCT in the last 72 hours. Urine analysis:    Component Value Date/Time   COLORURINE STRAW (A) 06/01/2020 0909   APPEARANCEUR CLEAR 06/01/2020 0909   LABSPEC 1.003 (L) 06/01/2020 0909   LABSPEC 1.025 03/18/2017 1337   PHURINE 5.0 06/01/2020 0909   GLUCOSEU >=500 (A) 06/01/2020 0909   HGBUR SMALL (A) 06/01/2020 0909   BILIRUBINUR NEGATIVE 06/01/2020 0909   BILIRUBINUR negative 03/18/2017 1337   BILIRUBINUR neg 12/17/2015 0944   KETONESUR NEGATIVE 06/01/2020 0909   PROTEINUR 100 (A) 06/01/2020 0909   UROBILINOGEN negative 12/17/2015 0944   UROBILINOGEN 0.2 12/16/2011 1208   NITRITE NEGATIVE 06/01/2020 0909   LEUKOCYTESUR NEGATIVE 06/01/2020 0909    Radiological Exams on Admission: DG Chest 2 View  Result Date: 07/19/2020 CLINICAL DATA:  Shortness of breath for a few days. EXAM: CHEST - 2 VIEW COMPARISON:  06/01/2020 FINDINGS: Cardiac pacemaker. Cardiac enlargement with pulmonary vascular congestion. New perihilar infiltrates likely representing edema. No pleural effusions. No pneumothorax. IMPRESSION: Cardiac enlargement with pulmonary vascular congestion and perihilar edema. Electronically Signed   By: Burman Nieves M.D.   On:  07/19/2020 03:31    EKG: Independently reviewed.  Wide QRS, unchanged from priors.  Assessment/Plan Principal Problem:   Acute on chronic combined systolic and diastolic CHF (congestive heart failure) (HCC) Active Problems:   Chronic anticoagulation   Atypical atrial flutter (HCC)   History of DVT (deep vein thrombosis)   History of creation of ostomy (HCC)   Ventricular tachyarrhythmia (HCC)   Chronic kidney disease, stage 3b (HCC)    Acute on chronic combined CHF - CHF pathway Lasix 80mg  IV x1 in ED Will put on 80mg  IV BID for the moment with next dose ordered for 1800 Cards consult: Advanced CHF team to see in AM Tele monitor Will hold off on ordering 2d echo and defer need for repeat to cards. Strict intake and output Daily BMP No ACEi / ARB due to renal fxn Cont farxiga, digoxin, coreg, aldactone Chronic anticoagulation - For h/o multiple DVTs, PE(s), LV thrombus,  Cont Eliquis A.Flutter - Cont eliquis S/p ablation Cont amiodarone H/o V.Tach - Cont amiodarone Has AICD in place CKD 3b - Creat today at baseline Follow BMPs with diuresis  DVT prophylaxis: Eliquis Code Status: Full Family Communication: No family in room Disposition Plan: Home after diuresis for CHF Consults called: Cardiology Admission status: Place in 10    Ziyad Dyar M. DO [facility]  How to contact the [facility] Attending or Consulting provider 7A - 7P or covering provider during after hours 7P -7A, for this patient?  Check the care team in [facility] and look for a) attending/consulting [facility] provider listed and b) the [facility] team listed Log into [email or web address]  [employer] Physician Scheduling and messaging for groups and whole hospitals  On call and physician scheduling software for group practices, residents, hospitalists and other medical providers for call, clinic, rotation and shift schedules. On[employer] is a hospital-wide system for scheduling doctors and paging doctors  on call.  EasyPlot is for scientific plotting and data analysis.  [email or web address]  and use [facility] universal password to access. If you do not have the password, please contact the hospital operator.  Locate the [facility] provider you are looking for under [facility] and page to a number that you can be directly reached. If you still have difficulty reaching the provider, please page the [facility] (Director on Call) for the Hospitalists listed on amion for assistance.  07/19/2020, 5:29 AM

## 2020-07-19 NOTE — Progress Notes (Signed)
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  Symptoms associated with orthopnea and while attempting to sleep.  Patient reports swelling in his abdomen is worse than his baseline with increased lower extremity edema.  Patient also reports nonproductive cough.  Denies chest pain, no dizziness, no fever/chills, no nausea/vomiting/diarrhea.  In the ED, temperature 98.4 F, HR 79, RR 18, BP 121/75, SPO2 94% on room air.  Sodium 134, potassium 3.6, chloride 100, CO2 21, glucose 137, BUN 17, creatinine 2.23, magnesium 2.0.  WBC 9.3, hemoglobin 15.7, platelets 213.  BNP 959.9.  High sensitive troponin 46.  Cova-19 PCR negative.  Influenza A/B PCR negative.  EKG with sinus tachycardia.  Chest x-ray with cardiac enlargement with pulmonary vascular congestion and perihilar edema.  Patient was given furosemide 80 mg IV x1 in the ED.  Cardiology consulted.  TRH consulted for further evaluation and management of acute on chronic, on systolic and diastolic congestive heart failure.   Assessment & Plan:   Principal Problem:   Acute on chronic combined systolic and diastolic CHF (congestive heart failure) (HCC) Active Problems:   Chronic anticoagulation   Atypical atrial flutter (HCC)   History of DVT (deep vein thrombosis)   History of creation of ostomy (HCC)   Ventricular tachyarrhythmia (HCC)   Chronic kidney disease, stage 3b (HCC)   Acute on chronic combined systolic and diastolic congestive heart failure Patient presenting to the ED  with progressive shortness of breath associated with worsening abdominal/lower extremity edema.  Patient was noted to have elevated BNP with vascular/perihilar congestion and edema on chest x-ray.  Patient is followed by cardiology/heart failure service outpatient; he is nearing end-stage and not a candidate for advanced therapies with patient's size, RV dysfunction, CKD and ileostomy. --Furosemide 80 mg IV every 12 hours --Spironolactone 12.5 mg p.o. daily --Digoxin 0.0625 mcg p.o. daily --Carvedilol reduced to 3.125 mg twice daily --Ranolazine 500 mg p.o. twice daily --Strict I's and O's and daily weights --Continue to monitor on telemetry  CKD stage IIIb Creatinine 2.23 on admission, baseline 2.2-2.6. --Avoid nephrotoxins, renally dose all medications --BMP in a.m.  History of ventricular tachycardia s/p AICD Patient follows with electrophysiology, Dr. Elberta Fortis outpatient.  No VT on device interrogation. --Continue amiodarone 200 mg p.o. twice daily --Continue mexiletine --Continue monitor potassium and magnesium levels daily --Continue telemetry  Hx atrial flutter status post ablation --Continue amiodarone --Apixaban 5 mg p.o. twice daily  History of diverticulitis s/p colon resection with ostomy --Wound care evaluation for continued ostomy care  History of recurrent DVT/PE on anticoagulation Right lower extremity more edematous than left lower extremity.  Reports compliance with anticoagulation. --Check right lower extremity venous duplex ultrasound --Continue Eliquis 5 mg p.o. twice daily  Morbid obesity Discussed with patient needs for aggressive lifestyle changes/weight loss as this complicates all facets of care.  Outpatient follow-up with PCP.  May benefit from bariatric evaluation outpatient.  Ethics: Patient with recurrent hospitalizations for congestive heart failure exacerbations, nearing end stage per cardiology and not candidate for advanced therapies.  Palliative  care  consulted for assistance with goals of care and medical decision making.  DVT prophylaxis:  apixaban (ELIQUIS) tablet 5 mg    Code Status: Full Code Family Communication: No family present at bedside this morning  Disposition Plan:  Level of care: Telemetry Cardiac Status is: Observation  The patient remains OBS appropriate and will d/c before 2 midnights.  Dispo: The patient is from: Home              Anticipated d/c is to: Home              Patient currently is not medically stable to d/c.   Difficult to place patient No   Consultants:  Cardiology/heart failure service  Procedures:  None  Antimicrobials:  None   Subjective: Patient seen examined bedside, resting comfortably.  Continues in ED holding area.  Sitting in bedside chair.  States breathing is slightly improved.  Reports good urine output with IV Lasix.  Reports that his right leg is more edematous than his left leg; which is usually not the case.  No other questions or concerns at this time.  Denies headache, no fever/chills/night sweats, no nausea/vomiting/diarrhea, no chest pain, no palpitations, no abdominal pain, no weakness, no fatigue, no paresthesias.  No acute events overnight per nursing staff.  Objective: Vitals:   07/19/20 1000 07/19/20 1304 07/19/20 1400 07/19/20 1500  BP: 105/81 98/71 103/73 106/76  Pulse: 79 74 76 74  Resp: (!) 25 19 (!) 24 (!) 29  Temp:  98.4 F (36.9 C)    TempSrc:      SpO2: 95% 97% 95% 96%    Intake/Output Summary (Last 24 hours) at 07/19/2020 1613 Last data filed at 07/19/2020 1029 Gross per 24 hour  Intake --  Output 2100 ml  Net -2100 ml   There were no vitals filed for this visit.  Examination:  General exam: Appears calm and comfortable, morbidly obese Respiratory system: Decreased breath sounds bilateral bases with crackles, normal respiratory effort without accessory muscle use, on room air at rest Cardiovascular system: S1 & S2 heard, RRR. No JVD,  murmurs, rubs, gallops or clicks.  3+ pitting edema bilateral lower extremities up to knee, asymmetric with right greater than left Gastrointestinal system: Abdomen is nondistended, soft and nontender. No organomegaly or masses felt. Normal bowel sounds heard. Central nervous system: Alert and oriented. No focal neurological deficits. Extremities: Symmetric 5 x 5 power. Skin: No rashes, lesions or ulcers Psychiatry: Judgement and insight appear normal. Mood & affect appropriate.     Data Reviewed: I have personally reviewed following labs and imaging studies  CBC: Recent Labs  Lab 07/19/20 0222  WBC 9.3  NEUTROABS 6.5  HGB 15.7  HCT 47.4  MCV 92.9  PLT 213   Basic Metabolic Panel: Recent Labs  Lab 07/19/20 0222 07/19/20 1403  NA 134*  --   K 3.6 4.1  CL 100  --   CO2 21*  --   GLUCOSE 137*  --   BUN 17  --   CREATININE 2.23*  --   CALCIUM 8.5*  --   MG 2.0  --    GFR: Estimated Creatinine Clearance: 55.1 mL/min (A) (by C-G formula based on SCr of 2.23 mg/dL (H)). Liver Function Tests: No results for input(s): AST, ALT, ALKPHOS, BILITOT, PROT, ALBUMIN in the last 168 hours. No results for input(s): LIPASE, AMYLASE in the last 168 hours. No results for input(s): AMMONIA in the last 168 hours. Coagulation Profile: No results for input(s): INR,  PROTIME in the last 168 hours. Cardiac Enzymes: No results for input(s): CKTOTAL, CKMB, CKMBINDEX, TROPONINI in the last 168 hours. BNP (last 3 results) No results for input(s): PROBNP in the last 8760 hours. HbA1C: No results for input(s): HGBA1C in the last 72 hours. CBG: No results for input(s): GLUCAP in the last 168 hours. Lipid Profile: No results for input(s): CHOL, HDL, LDLCALC, TRIG, CHOLHDL, LDLDIRECT in the last 72 hours. Thyroid Function Tests: No results for input(s): TSH, T4TOTAL, FREET4, T3FREE, THYROIDAB in the last 72 hours. Anemia Panel: No results for input(s): VITAMINB12, FOLATE, FERRITIN, TIBC, IRON,  RETICCTPCT in the last 72 hours. Sepsis Labs: No results for input(s): PROCALCITON, LATICACIDVEN in the last 168 hours.  Recent Results (from the past 240 hour(s))  Resp Panel by RT-PCR (Flu A&B, Covid) Nasopharyngeal Swab     Status: None   Collection Time: 07/19/20  4:03 AM   Specimen: Nasopharyngeal Swab; Nasopharyngeal(NP) swabs in vial transport medium  Result Value Ref Range Status   SARS Coronavirus 2 by RT PCR NEGATIVE NEGATIVE Final    Comment: (NOTE) SARS-CoV-2 target nucleic acids are NOT DETECTED.  The SARS-CoV-2 RNA is generally detectable in upper respiratory specimens during the acute phase of infection. The lowest concentration of SARS-CoV-2 viral copies this assay can detect is 138 copies/mL. A negative result does not preclude SARS-Cov-2 infection and should not be used as the sole basis for treatment or other patient management decisions. A negative result may occur with  improper specimen collection/handling, submission of specimen other than nasopharyngeal swab, presence of viral mutation(s) within the areas targeted by this assay, and inadequate number of viral copies(<138 copies/mL). A negative result must be combined with clinical observations, patient history, and epidemiological information. The expected result is Negative.  Fact Sheet for Patients:  BloggerCourse.com  Fact Sheet for Healthcare Providers:  SeriousBroker.it  This test is no t yet approved or cleared by the [place] FDA and  has been authorized for detection and/or diagnosis of SARS-CoV-2 by FDA under an Emergency Use Authorization (EUA). This EUA will remain  in effect (meaning this test can be used) for the duration of the COVID-19 declaration under Section 564(b)(1) of the Act, 21 U.S.C.section 360bbb-3(b)(1), unless the authorization is terminated  or revoked sooner.       Influenza A by PCR NEGATIVE NEGATIVE Final   Influenza  B by PCR NEGATIVE NEGATIVE Final    Comment: (NOTE) The Xpert Xpress SARS-CoV-2/FLU/RSV plus assay is intended as an aid in the diagnosis of influenza from Nasopharyngeal swab specimens and should not be used as a sole basis for treatment. Nasal washings and aspirates are unacceptable for Xpert Xpress SARS-CoV-2/FLU/RSV testing.  Fact Sheet for Patients: BloggerCourse.com  Fact Sheet for Healthcare Providers: SeriousBroker.it  This test is not yet approved or cleared by the [place] FDA and has been authorized for detection and/or diagnosis of SARS-CoV-2 by FDA under an Emergency Use Authorization (EUA). This EUA will remain in effect (meaning this test can be used) for the duration of the COVID-19 declaration under Section 564(b)(1) of the Act, 21 U.S.C. section 360bbb-3(b)(1), unless the authorization is terminated or revoked.  Performed at [facility], [address] [place], [place] [address]          Radiology Studies: DG Chest 2 View  Result Date: 07/19/2020 CLINICAL DATA:  Shortness of breath for a few days. EXAM: CHEST - 2 VIEW COMPARISON:  06/01/2020 FINDINGS: Cardiac pacemaker. Cardiac enlargement with pulmonary vascular  congestion. New perihilar infiltrates likely representing edema. No pleural effusions. No pneumothorax. IMPRESSION: Cardiac enlargement with pulmonary vascular congestion and perihilar edema. Electronically Signed   By: Burman Nieves M.D.   On: 07/19/2020 03:31        Scheduled Meds:  amiodarone  200 mg Oral BID   apixaban  5 mg Oral BID   carvedilol  3.125 mg Oral BID WC   dapagliflozin propanediol  10 mg Oral Daily   digoxin  0.0625 mg Oral Daily   furosemide  80 mg Intravenous BID   mexiletine  300 mg Oral Q12H   pantoprazole  40 mg Oral Daily   potassium chloride SA  40 mEq Oral Daily   ranolazine  500 mg Oral BID   sodium chloride flush  3 mL Intravenous Q12H    spironolactone  50 mg Oral Daily   Continuous Infusions:  sodium chloride       LOS: 0 days    Time spent: 42 minutes spent on chart review, discussion with nursing staff, consultants, updating family and interview/physical exam; more than 50% of that time was spent in counseling and/or coordination of care.    Alvira Philips [place], DO [facility] Available via Epic secure chat 7am-7pm After these hours, please refer to coverage provider listed on [email or web address] 07/19/2020, 4:13 PM

## 2020-07-19 NOTE — Consult Note (Signed)
WOC Nurse ostomy consult note Patient receiving care in [facility] 3E29 Patient is independent in ostomy care. Stoma type/location: RLQ ileostomy Stomal assessment/size: deferred, supplies must be obtained. Peristomal assessment: deferred Treatment options for stomal/peristomal skin: Output Ostomy pouching: 2pc.  Keep ostomy supplies at bedside: Pouch, [employer] [place] [phone]; skin barriers, [employer] [place] #2; barrier rings, [employer] [place] [phone]; skin prep pads from your stock room.2 piece pouching system   Fecal pouches , [employer] #[phone]              Skin [employer] # [address] # [phone]   Thank you for the consult. WOC nurse will not follow at this time.   Please re-consult the WOC team if needed.  Renaldo Reel Katrinka Blazing, MSN, RN, CMSRN, Angus Seller, [facility] Wound Treatment Associate Pager [phone]

## 2020-07-19 NOTE — ED Provider Notes (Signed)
Emergency Medicine Provider Triage Evaluation Note  Roberto [name] , a 55 y.o. male  was evaluated in triage.  Pt complains of SOB over the past few days. Symptoms only present when he is trying to sleep; can occur when upright or supine. Feels like he will wake up trying to catch his breath. Has DOE which is chronic and unchanged. Has been complaint with his daily medications and denies any significant weight gain. No fevers, syncope, ICD shocks.  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 Denies known hx of sleep apnea.   Review of Systems  Positive: SOB Negative: Syncope, fevers  Physical Exam  There were no vitals taken for this visit. Gen:   Awake, no distress   Resp:  Normal effort  MSK:   Moves extremities without difficulty  Other:  Morbid obesity. 1+ pitting edema BLE.  Medical Decision Making  Medically screening exam initiated at 2:18 AM.  Appropriate orders placed.  CONROY GORACKE was informed that the remainder of the evaluation will be completed by another provider, this initial triage assessment does not replace that evaluation, and the importance of remaining in the ED until their evaluation is complete.  SOB   Antony Madura, PA-C 07/19/20 0076    Glynn Octave, MD 07/19/20 [phone]

## 2020-07-19 NOTE — ED Triage Notes (Signed)
Pt presents to ED BIB [facility] from home. Pt c/o SOB x few days. Worsens when lying down. Hx CHF. Has ICD d/t SVT in past. EMS VS -  90/50 HR - 80 97%

## 2020-07-19 NOTE — Progress Notes (Signed)
RLE venous duplex has been completed.  Results can be found under chart review under CV PROC. 07/19/2020 4:36 PM Denelle Capurro RVT, RDMS

## 2020-07-19 NOTE — Consult Note (Addendum)
Advanced Heart Failure Team Consult Note   Primary Physician: Roberto NianLalonde, [name], MD PCP-Cardiologist:  Roberto Meresaniel Bensimhon, MD  Reason for Consultation: Acute on chronic systolic CHF  HPI:    Roberto [name] is seen today for evaluation of acute on chronic systolic CHF at the request of Dr. Julian [name]. This is a 55 year old male with history of systolic HF due to NICM (no CAD on cath 2013), h/o recurrent DVT with resultant PE and chronic venous insufficiency, h/o LV thrombus (on Coumadin), PSVT, and morbid obesity. 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 EF dropped from 40%-> 15%. Underwent successful DCCV. Started on amiodarone. S/p AFL ablation 11/17/2016. He's had recurrent recurrent issues with VT requiring shocks in setting of hypokalemia and low magnesium.   Most recently admitted 5/22 with recurrent VT/VF and shocks from ICD. Low K and Mag. Seen by EP. Treated with amio and mexilitene. Not a candidate [facility] at [facility]. Evaluation by Dr Roberto [name] for consideration of high risk ablation considered if has recurrent VT. Was seen by Palliative Care and wished to remain full code.  Last seen in clinic for f/u on 06/22/20 and appeared stable. Weight 337#, up 7# from discharge in May.  He called the clinic yesterday with orthopnea and PND X 3 days and worsening leg edema. Has not been able to elevate legs as much during the day while sitting at a desk. Did not notice any significant weight gain. Notes dry cough but no fever or chills. No palpitations or recurrent syncope. No changes to diet and compliant with medications.   He was scheduled for clinic f/u today but presented to ED early this am d/t worsening symptoms. Weight 341 #. Creatinine 2.23, BNP 959 (773 in May), HS troponin 46. SARS-COV2 negative. Appeared volume overloaded and chest x-ray consistent with CHF. He was given 80 mg lasix IV and admitted for  further management of acute on chronic CHF. Subsequently started on 80 mg lasix IV BID.   ICD interrogation: No atrial or ventricular high rate episodes. Possible intrathoracic fluid accumulation based on OptiVol data.  Cardiac studies: Echo (12/21): EF 20-25%, RV ok, personally reviewed Echo (07/2017): EF appears slightly improved to 20-25%. Possible clot (vs artifact in IVC/RA junction) Personally reviewed Echo (10/15/16): LVEF 15% Echo (5/14): EF 40-45% Echo (12/13): EF 25-30%   TEE (03/20/17): EF 15-20%, Severe reduced RV function   RHC 04/2020 RA = 2 RV = 24/1 PA = 25/3 (15) PCW = 7 Fick cardiac output/index = 6.0/23 Thermo CO/CI = 6.1/2.4 PVR = 0.7 WU FA sat = 95% PA sat = 67%, 68%  Assessment: 1. Low filling pressures 2. Normal cardiac output   RHC 10/20/16 RA = 3 RV = 45/7 PA = 49/20 (31) PCW = 28 Fick cardiac output/index = 5.0/2.1 PVR = 0.5 WU Ao sat = 95% PA sat = 64%, 61%   CARDIAC MRI - 12/18/11  1. Moderately dilated left ventricle with moderately decreased systolic function, EF 38%. Global hypokinesis. 2. Mildly dilated right ventricle with mild to moderately decreased systolic function. 3. No definite LV thrombus noted. 4. Patchy non-subendocardial delayed enhancement seen in the ventricular septum (see above for description). This is not suggestive of a coronary disease pattern. This could be suggestive of infiltrative disease versus prior myocarditis.   RHC/LHC 12/22/11 Cors: Normal   SH: Lives independently and works from home. No alcohol or tobacco use.  FH: Father had diabetes and CAD, passed from an MI. No other known family history of CAD or CHF.  Review of Systems: [y] = yes, [ ]  = no   General: Weight gain [ ] ; Weight loss [ ] ; Anorexia [ ] ; Fatigue [ ] ; Fever [ ] ; Chills [ ] ; Weakness [ ]   Cardiac: Chest pain/pressure [ ] ; Resting SOB [ ] ; Exertional SOB [Y]; Orthopnea [Y]; Pedal Edema [Y]; Palpitations [ ] ; Syncope [ ] ; Presyncope [ ] ;  Paroxysmal nocturnal dyspnea[Y]  Pulmonary: Cough [Y]; Wheezing[ ] ; Hemoptysis[ ] ; Sputum [ ] ; Snoring [ ]   GI: Vomiting[ ] ; Dysphagia[ ] ; Melena[ ] ; Hematochezia [ ] ; Heartburn[ ] ; Abdominal pain [ ] ; Constipation [ ] ; Diarrhea [ ] ; BRBPR [ ]   GU: Hematuria[ ] ; Dysuria [ ] ; Nocturia[ ]   Vascular: Pain in legs with walking [ ] ; Pain in feet with lying flat [ ] ; Non-healing sores [ ] ; Stroke [ ] ; TIA [ ] ; Slurred speech [ ] ;  Neuro: Headaches[ ] ; Vertigo[ ] ; Seizures[ ] ; Paresthesias[ ] ;Blurred vision [ ] ; Diplopia [ ] ; Vision changes [ ]   Ortho/Skin: Arthritis [ ] ; Joint pain [ ] ; Muscle pain [ ] ; Joint swelling [ ] ; Back Pain [ ] ; Rash [ ]   Psych: Depression[ ] ; Anxiety[ ]   Heme: Bleeding problems [ ] ; Clotting disorders [ ] ; Anemia [ ]   Endocrine: Diabetes [ ] ; Thyroid dysfunction[ ]   Home Medications Prior to Admission medications   Medication Sig Start Date End Date Taking? Authorizing Provider  amiodarone (PACERONE) 200 MG tablet Take 200 mg by mouth 2 (two) times daily.   Yes [provider]  carvedilol (COREG) 6.25 MG tablet Take 1 tablet (6.25 mg total) by mouth 2 (two) times daily with a meal. 06/10/20  Yes , MD  dapagliflozin propanediol (FARXIGA) 10 MG TABS tablet Take 1 tablet (10 mg total) by mouth daily. 05/29/20  Yes , MD  digoxin (LANOXIN) 0.125 MG tablet Take 0.5 tablets (0.0625 mg total) by mouth daily. 06/22/20  Yes [name], , MD  ELIQUIS 5 MG TABS tablet TAKE 1 TABLET(5 MG) BY MOUTH TWICE DAILY Patient taking differently: Take 5 mg by mouth 2 (two) times daily. 04/27/20  Yes M, PA-[name]  mexiletine (MEXITIL) 150 MG capsule Take 2 capsules (300 mg total) by mouth every 12 (twelve) hours. 06/10/20  Yes , MD  pantoprazole (PROTONIX) 40 MG tablet Take 1 tablet (40 mg total) by mouth daily. 06/22/20  Yes [name], , MD  potassium chloride SA (KLOR-CON) 20 MEQ tablet Take 2 tablets (40 mEq total) by  mouth daily. 07/11/20  Yes [name], , MD  ranolazine (RANEXA) 500 MG 12 hr tablet Take 1 tablet (500 mg total) by mouth 2 (two) times daily. 06/10/20  Yes , MD  spironolactone (ALDACTONE) 25 MG tablet Take 2 tablets (50 mg total) by mouth daily. 06/10/20  Yes , MD  torsemide (DEMADEX) 20 MG tablet Take 2 tablets (40 mg total) by mouth 2 (two) times daily. 05/24/20  Yes , DO    Past 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   Medtronic ICD   Arthritis    knee   Chronic combined systolic and diastolic CHF (congestive heart failure) (HCC)    Chronic venous insufficiency    Coronary artery disease    Deep vein thrombosis (HCC) 2007   left leg   Diverticulitis  [name]/b perforated colon and nonhealing abdominal wound s/p skin grafting, ileostomy   Hepatitis    patient denies this dx   Lupus anticoagulant positive    Morbid obesity (HCC)    Morbid obesity with BMI of 50.0-59.9, adult (HCC)    Mural thrombus of heart    coumadin   Nonischemic cardiomyopathy (HCC)    a) 12/17/11 echo: LVEF 25-30%, grade 3 diastolic dysfunction ([name]/w restriction), mod MR, mod LA/LV and mild RA dilatation; b) 12/18/11 cMRI: LVEF 38%, mod LV/mild RV dilatation, global HK, mild-mod RV sys dysfxn, no LV thrombus & patchy non-subendocardial delayed enhancement [name]/w infil dz or prior myocarditis; [name]. 07/2018 EF 25-30%.   Paroxysmal SVT (supraventricular tachycardia) (HCC)    Pneumonia    x 2   Pulmonary embolism (HCC)    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   Ventricular tachyarrhythmia [facility])    Prior VT/VF   Wears glasses    Wound of abdomen     Past Surgical History: Past Surgical History:  Procedure Laterality Date   A-FLUTTER ABLATION N/A 11/17/2016   Procedure: A-FLUTTER ABLATION;  Surgeon: Regan Lemming, MD;  Location: [facility] INVASIVE CV LAB;  Service: Cardiovascular;  Laterality: N/A;    APPLICATION OF WOUND VAC N/A 09/05/2019   Procedure: APPLICATION OF WOUND VAC;  Surgeon: Allena Napoleon, MD;  Location: [facility] OR;  Service: Plastics;  Laterality: N/A;  PATIENT'S HOME WOUND VAC APPLIED.   BIOPSY  12/23/2018   Procedure: BIOPSY;  Surgeon: Meridee Score Netty Starring., MD;  Location: [facility] ENDOSCOPY;  Service: Gastroenterology;;   BIOPSY  01/23/2020   Procedure: BIOPSY;  Surgeon: Lemar Lofty., MD;  Location: [facility] ENDOSCOPY;  Service: Gastroenterology;;   CARDIAC CATHETERIZATION  06/2006   Angiographically normal cors   CARDIAC CATHETERIZATION  12/22/2011   R/LHC: normal cors, well-compensated HDs, LV dysfxn   CARDIOVERSION N/A 10/17/2016   Procedure: CARDIOVERSION;  Surgeon: Dolores Patty, MD;  Location: [facility] ENDOSCOPY;  Service: Cardiovascular;  Laterality: N/A;   CARDIOVERSION N/A 03/20/2017   Procedure: CARDIOVERSION;  Surgeon: Dolores Patty, MD;  Location: [facility] ENDOSCOPY;  Service: Cardiovascular;  Laterality: N/A;   COLECTOMY N/A 12/28/2018   Procedure: TOTAL COLECTOMY;  Surgeon: Emelia Loron, MD;  Location: [facility] OR;  Service: General;  Laterality: N/A;   FLEXIBLE SIGMOIDOSCOPY N/A 12/23/2018   Procedure: FLEXIBLE SIGMOIDOSCOPY;  Surgeon: Lemar Lofty., MD;  Location: [facility] ENDOSCOPY;  Service: Gastroenterology;  Laterality: N/A;   FLEXIBLE SIGMOIDOSCOPY N/A 01/23/2020   Procedure: FLEXIBLE SIGMOIDOSCOPY;  Surgeon: Meridee Score Netty Starring., MD;  Location: [facility] ENDOSCOPY;  Service: Gastroenterology;  Laterality: N/A;   ICD IMPLANT N/A 09/03/2018   Procedure: ICD Medtronic IMPLANT;  Surgeon: Regan Lemming, MD;  Location: [facility] INVASIVE CV LAB;  Service: Cardiovascular;  Laterality: N/A;   ILEOSTOMY N/A 12/28/2018   Procedure: ILEOSTOMY;  Surgeon: Emelia Loron, MD;  Location: [facility] OR;  Service: General;  Laterality: N/A;   IR RADIOLOGIST EVAL & MGMT  02/08/2019   JOINT REPLACEMENT     right knee   LEFT AND RIGHT HEART CATHETERIZATION WITH CORONARY  ANGIOGRAM N/A 12/22/2011   Procedure: LEFT AND RIGHT HEART CATHETERIZATION WITH CORONARY ANGIOGRAM;  Surgeon: Dolores Patty, MD;  Location: [facility] CATH LAB;  Service: Cardiovascular;  Laterality: N/A;   PATELLAR TENDON REPAIR     Left   RIGHT HEART CATH N/A 10/20/2016   Procedure: RIGHT HEART CATH;  Surgeon: Dolores Patty, MD;  Location: [facility] INVASIVE CV LAB;  Service: Cardiovascular;  Laterality:  N/A;   RIGHT HEART CATH N/A 04/17/2020   Procedure: RIGHT HEART CATH;  Surgeon: Dolores Patty, MD;  Location: [facility] INVASIVE CV LAB;  Service: Cardiovascular;  Laterality: N/A;   SKIN SPLIT GRAFT N/A 09/05/2019   Procedure: SKIN GRAFT SPLIT THICKNESS;  Surgeon: Allena Napoleon, MD;  Location: [facility] OR;  Service: Plastics;  Laterality: N/A;   TEE WITHOUT CARDIOVERSION N/A 03/20/2017   Procedure: TRANSESOPHAGEAL ECHOCARDIOGRAM (TEE);  Surgeon: Dolores Patty, MD;  Location: [facility] ENDOSCOPY;  Service: Cardiovascular;  Laterality: N/A;    Family History: Family History  Problem Relation Age of Onset   Hypertension Mother    Clotting disorder Mother        mom with PEs   Cancer Mother        uterine cancer   Diabetes Father    Heart attack Father    Hypertension Other        Sibling   Diabetes Other    Obesity Other    Diabetes Sister    Obesity Sister    Obesity Sister    Colon cancer Neg Hx    Stomach cancer Neg Hx    Esophageal cancer Neg Hx    Rectal cancer Neg Hx    Inflammatory bowel disease Neg Hx    Liver disease Neg Hx    Pancreatic cancer Neg Hx     Social History: Social History   Socioeconomic History   Marital status: Divorced    Spouse name: Not on file   Number of children: 5   Years of education: Not on file   Highest education level: Not on file  Occupational History   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  Exercises with weights and some walking. 12/2015   Social Determinants of Health   Financial Resource Strain: Low Risk    Difficulty of Paying Living Expenses: Not very hard  Food Insecurity: No Food Insecurity   Worried About Programme researcher, broadcasting/film/video in the Last Year: Never true   Ran Out of Food in the Last Year: Never true  Transportation Needs: No Transportation Needs   Lack of Transportation (Medical): No   Lack of Transportation (Non-Medical): No  Physical Activity: Not on file  Stress: Not on file  Social Connections: Not on file    Allergies:  Allergies  Allergen Reactions   Shrimp [Shellfish Allergy] Hives and Itching    Objective:    Vital Signs:   Temp:  [97.7 F (36.5 [name])-98.5 F (36.9 [name])] 97.7 F (36.5 [name]) (07/07 0738) Pulse Rate:  [62-80] 79 (07/07 1000) Resp:  [18-27] 25 (07/07 1000) BP: (96-121)/(70-81) 105/81 (07/07 1000) SpO2:  [94 %-96 %] 95 % (07/07 1000)    Weight change: There were no vitals filed for this visit.  Intake/Output:   Intake/Output Summary (Last 24 hours) at 07/19/2020 1218 Last data filed at 07/19/2020 1029 Gross per 24 hour  Intake --  Output 2100 ml  Net -2100 ml      Physical Exam    General:  No acute distress. Morbidly obese. HEENT: normal Neck: supple. + JVD . Carotids 2+ bilat; no bruits. No lymphadenopathy or thyromegaly appreciated. Cor: PMI nondisplaced. Regular rate & rhythm. No rubs, gallops or murmurs. Lungs: diminished breath sounds in bases Abdomen: soft, nontender, nondistended. + ileostomy. No hepatosplenomegaly. No bruits or masses. Good bowel sounds. Extremities: no cyanosis, clubbing, rash, 2-3+  right lower extremity edema, 1+ on left, venous stasis changes left > right Neuro: alert & orientedx3, cranial nerves grossly intact. moves all 4 extremities w/o difficulty. Affect pleasant   EKG    Today at 3:13 am: Sinus rhythm with 1st degree AVB  Labs   Basic Metabolic Panel: Recent Labs  Lab 07/19/20 0222  NA 134*  K 3.6  CL 100  CO2 21*  GLUCOSE 137*  BUN 17  CREATININE 2.23*  CALCIUM 8.5*  MG 2.0    Liver Function Tests: No results for input(s): AST, ALT, ALKPHOS, BILITOT, PROT, ALBUMIN in the last 168 hours. No results for input(s): LIPASE, AMYLASE in the last 168 hours. No results for input(s): AMMONIA in the last 168 hours.  CBC: Recent Labs  Lab 07/19/20 0222  WBC 9.3  NEUTROABS 6.5  HGB 15.7  HCT 47.4  MCV 92.9  PLT 213    Cardiac Enzymes: No results for input(s): CKTOTAL, CKMB, CKMBINDEX, TROPONINI in the last 168 hours.  BNP: BNP (last 3 results) Recent Labs    05/24/20 0759 06/01/20 0909 07/19/20 0222  BNP 583.6* 773.7* 959.9*    ProBNP (last 3 results) No results for input(s): PROBNP in the last 8760 hours.   CBG: No results for input(s): GLUCAP in the last 168 hours.  Coagulation Studies: No results for input(s): LABPROT, INR in the last 72 hours.   Imaging   DG Chest 2 View  Result Date: 07/19/2020 CLINICAL DATA:  Shortness of breath for a few days. EXAM: CHEST - 2 VIEW COMPARISON:  06/01/2020 FINDINGS: Cardiac pacemaker. Cardiac enlargement with pulmonary vascular congestion. New perihilar infiltrates likely representing edema. No pleural effusions. No pneumothorax. IMPRESSION: Cardiac enlargement with pulmonary vascular congestion and perihilar edema. Electronically Signed   By: Burman Nieves M.D.   On: 07/19/2020 03:31     Medications:     Current Medications:  amiodarone  200 mg Oral BID   apixaban  5 mg Oral BID   carvedilol  6.25 mg Oral BID WC   dapagliflozin propanediol  10 mg Oral Daily   digoxin  0.0625 mg  Oral Daily   furosemide  80 mg Intravenous BID   mexiletine  300 mg Oral Q12H   pantoprazole  40 mg Oral Daily   potassium chloride SA  40 mEq Oral Daily   ranolazine  500 mg Oral BID   sodium chloride flush  3 mL Intravenous Q12H   spironolactone  50 mg Oral Daily    Infusions:  sodium chloride       Assessment/Plan   1. Acute on Chronic Systolic Heart Failure: - Echo 10/15/16 - LVEF 15%. - TEE 03/2017 LVEF 15-20%. - Echo 07/2017 with LVEF 20-25%. - Echo 7/20 EF 25-30%. - Echo 12/21 EF 25-30%. - Presenting with NYHA class IV symptoms. Given 80 mg lasix IV this am and initiated on 80 mg lasix IV BID. Diuresing well. Weight up 4 # from office visit in June and 10# from D/[name] in May. - Continue Spiro 12.5 mg  daily - Continue digoxin 0.0625 mcg daily. Dose reduced in June (dig level 1.0 at that time). - Hold carvedilol due to acute on chronic CHF. - Off Entresto due to recent AKI.  - Continue farxiga 10 mg daily.  - Nearing end-stage. Not candidate for advanced therapies with size, RV dysfunction, CKD, and ileostomy. - Palliative care consult for GOC discussion d/t recurrent admissions. Previously wished to remain full code.  2. Elevated troponin -Mildly elevated HS troponin.  -Likely d/t demand ischemia from #1 -No prior hx of CAD - negative cath in 2013  3. H/o VT - On 12/16/19 in setting of hypokalemia related to metolazone use. - Admitted 4/22 and 5/22 with recurrent VT.  - Followed by Dr. Elberta Fortis as outpatient. - Not a candidate for ablation at [facility] - Now on amio and mexilitene.  - No driving for 6 months (~16/10). - No recurrent VT on ICD interrogation today - Monitor K closely with diuresis.   4. 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 - No arrhythmias on device check today. - Continue amiodarone. - On Eliquis 5 bid. No bleeding issues.   5. H/o Acute diverticulitis with colon resection and ileostomy - S/p skin grafting 09/05/19. - No plan for reversal of ileostomy  after extensive abdominal hernia.   6. CKD Stage IIIb - Creatinine baseline 1.8-2.1 - Creatinine 2.23 early this am.   Length of Stay: 0  FINCH, LINDSAY N, PA-[name]  07/19/2020, 12:18 PM  Advanced Heart Failure Team Pager [phone] (M-F; 7a - 5p)  Please contact [facility] for night-coverage after hours (4p -7a ) and weekends on [email or web address]   Patient seen with PA, agree with the above note.   He has been compliant with meds at home, taking torsemide 40 mg bid.  He came to the ER with weight gain and increased dyspnea.  Creatinine close to baseline at 2.2.  He has not had any further VT (device interrogated.   General: NAD, obese Neck: JVP 12-14 cm, no thyromegaly or thyroid nodule.  Lungs: Clear to auscultation bilaterally with normal respiratory effort. CV: Nondisplaced PMI.  Heart regular S1/S2, no S3/S4, no murmur.  1+ ankle edema.  No carotid bruit.  Normal pedal pulses.  Abdomen: Soft, nontender, no hepatosplenomegaly, no distention.  Skin: Intact without lesions or rashes.  Neurologic: Alert and oriented x 3.  Psych: Normal affect. Extremities: No clubbing or cyanosis.  HEENT: Normal.   Acute on chronic systolic CHF.  Volume overloaded on exam, will need a day or 2 of IV diuresis.  - Lasix 80 mg IV bid.  - Decrease Coreg to 3.125 mg bid for now with CHF exacerbation .  No VT on device interrogation. Continue amiodarone, mexiletine, ranolazine at home doses.  Keep K and Mg replaced.   Marca Ancona 07/19/2020 1:14 PM

## 2020-07-19 NOTE — Plan of Care (Signed)
?  Problem: Clinical Measurements: ?Goal: Respiratory complications will improve ?Outcome: Progressing ?  ?Problem: Elimination: ?Goal: Will not experience complications related to urinary retention ?Outcome: Progressing ?  ?

## 2020-07-19 NOTE — ED Notes (Signed)
Patient placed up in recliner for comfort.

## 2020-07-19 NOTE — ED Provider Notes (Signed)
[name] Provider Note   CSN: [id number] Arrival date & time: 07/19/20  0210     History Chief Complaint  Patient presents with   Shortness of Breath    Roberto GRACA is a 55 y.o. 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  The patient reports intermittent shortness of breath for the last 3 days.  He is only feeling dyspneic at night.  He has noticed the symptoms when he tries to sleep in his recliner or when he tries to sleep in his bed, where he sleeps on his side with approximately 2-3 pillows.  He has tried increasing the pillows with no improvement in his symptoms.  He reports orthopnea.  He also endorses worsening swelling in his abdomen.  He states that his lower legs are also swollen, but states that he chronically has swelling in his legs due to side effects from his home medications.  He reports an associated nonproductive cough that accompany shortness of breath.  He adamantly denies chest pain, dizziness, lightheadedness, fever, chills, syncope, nausea, vomiting, diarrhea, diaphoresis, visual changes, headache.  He has been compliant with all of his home medications.  He takes 40 mg of torsemide twice daily.  Report he has been following a low-salt diet.  He reports that he has been weighing himself daily.  He has actually lost some weight recently as he has been trying to be more active.  Per chart review, patient's weights from his most recent cardiology visits since he was  discharged from the hospital on 5/29:  Wt Readings from Last 3 Encounters:  07/06/20 (!) 341 lb (154.7 kg)  06/22/20 (!) 337 lb 3.2 oz (153 kg)  06/10/20 (!) 331 lb 11.2 oz (150.5 kg)     The history is provided by the patient and medical records. No language interpreter was used.      Past Medical History:  Diagnosis Date   AICD (automatic cardioverter/defibrillator) present 08/2018   Medtronic ICD   Arthritis    knee   Chronic combined systolic and diastolic CHF (congestive heart failure) (HCC)    Chronic venous insufficiency    Coronary artery disease    Deep vein thrombosis (HCC) 2007   left leg   Diverticulitis    c/b perforated colon and nonhealing abdominal wound s/p skin grafting, ileostomy   Hepatitis    patient denies this dx   Lupus anticoagulant positive    Morbid obesity (HCC)    Morbid obesity with BMI of 50.0-59.9, adult (HCC)    Mural thrombus of heart    coumadin   Nonischemic cardiomyopathy (HCC)    a) 12/17/11 echo: LVEF 25-30%, grade 3 diastolic dysfunction (c/w restriction), mod MR, mod LA/LV and mild RA dilatation; b) 12/18/11 cMRI: LVEF 38%, mod LV/mild RV dilatation, global HK, mild-mod RV sys dysfxn, no LV thrombus & patchy non-subendocardial delayed enhancement c/w infil dz or prior myocarditis; c. 07/2018 EF 25-30%.   Paroxysmal SVT (supraventricular tachycardia) (HCC)    Pneumonia    x 2   Pulmonary embolism (HCC)    DVT and PE  after knee surgery in 2007   Ventricular tachyarrhythmia [facility])    Prior VT/VF   Wears glasses    Wound of abdomen     Patient Active Problem List   Diagnosis Date Noted   Acute on chronic systolic CHF (congestive heart failure) (HCC) 07/19/2020   Acute kidney injury superimposed on chronic kidney disease (HCC) 06/10/2020   Chronic systolic CHF (congestive heart failure) (HCC)    Ventricular tachyarrhythmia (HCC) 06/01/2020   Acute on chronic combined systolic and diastolic CHF (congestive heart failure) (HCC)  04/13/2020   S/P implantation of automatic cardioverter/defibrillator (AICD) 04/13/2020   Hx of diverticulitis of colon 12/06/2019   History of colon resection 12/06/2019   History of creation of ostomy (HCC) 12/06/2019   History of colonic polyps 12/06/2019   Hypomagnesemia    History of LV (left ventricular) mural thrombus 09/04/2018   History of DVT (deep vein thrombosis) 09/04/2018   PAF (paroxysmal atrial fibrillation) (HCC) 09/04/2018   Gout 05/06/2018   Atypical atrial flutter (HCC) 03/17/2017   Chronic pain of right knee 03/13/2017   Typical atrial flutter s/p catheter ablation    Varicose veins of legs 12/17/2015   Encounter for health maintenance examination in adult 12/17/2015   Special screening for malignant neoplasms, colon 12/17/2015   Vaccine counseling 12/17/2015   Knee deformity, acquired, right 12/17/2015   Gait disturbance 12/17/2015   Osteoarthritis of right knee 12/17/2015   Varicose veins of lower extremities with ulcer (HCC) 09/18/2015   Need for prophylactic vaccination and inoculation against influenza 09/18/2015   Tinea versicolor 06/15/2015   Impaired fasting blood sugar 12/28/2014   Chronic anticoagulation 12/28/2014   Erectile dysfunction 02/19/2012   Chronic systolic heart failure (HCC) 12/29/2011   Hypokalemia 12/24/2011   Lupus anticoagulant positive 12/22/2011   Nonischemic cardiomyopathy (HCC) 12/16/2011   Paroxysmal SVT (supraventricular tachycardia) (HCC) 12/16/2011   History of venous thromboembolism 12/16/2011   Venous insufficiency 11/04/2011   Morbid obesity with BMI of 50.0-59.9, adult (HCC) 06/02/2008    Past Surgical History:  Procedure Laterality Date   A-FLUTTER ABLATION N/A 11/17/2016   Procedure: A-FLUTTER ABLATION;  Surgeon: Regan Lemming, MD;  Location: [facility] INVASIVE CV LAB;  Service: Cardiovascular;  Laterality: N/A;   APPLICATION OF WOUND VAC N/A 09/05/2019   Procedure: APPLICATION OF WOUND VAC;  Surgeon: Allena Napoleon, MD;  Location: [facility] OR;  Service: Plastics;  Laterality: N/A;  PATIENT'S HOME WOUND VAC APPLIED.   BIOPSY  12/23/2018   Procedure: BIOPSY;  Surgeon: Meridee Score Netty Starring., MD;  Location: [facility] ENDOSCOPY;  Service: Gastroenterology;;   BIOPSY  01/23/2020   Procedure: BIOPSY;  Surgeon: Lemar Lofty., MD;  Location: [facility] ENDOSCOPY;  Service: Gastroenterology;;   CARDIAC CATHETERIZATION  06/2006   Angiographically normal cors   CARDIAC CATHETERIZATION  12/22/2011   R/LHC: normal cors, well-compensated HDs, LV dysfxn   CARDIOVERSION N/A 10/17/2016   Procedure: CARDIOVERSION;  Surgeon: Dolores Patty, MD;  Location: [facility] ENDOSCOPY;  Service: Cardiovascular;  Laterality: N/A;   CARDIOVERSION N/A 03/20/2017   Procedure: CARDIOVERSION;  Surgeon: Dolores Patty, MD;  Location: [facility] ENDOSCOPY;  Service: Cardiovascular;  Laterality: N/A;   COLECTOMY N/A 12/28/2018   Procedure: TOTAL COLECTOMY;  Surgeon: Emelia Loron, MD;  Location: [facility] OR;  Service: General;  Laterality: N/A;   FLEXIBLE SIGMOIDOSCOPY N/A 12/23/2018   Procedure: FLEXIBLE SIGMOIDOSCOPY;  Surgeon: Lemar Lofty., MD;  Location: [facility] ENDOSCOPY;  Service: Gastroenterology;  Laterality: N/A;   FLEXIBLE SIGMOIDOSCOPY N/A 01/23/2020   Procedure:  FLEXIBLE SIGMOIDOSCOPY;  Surgeon: Mansouraty, Netty Starring., MD;  Location: [facility] ENDOSCOPY;  Service: Gastroenterology;  Laterality: N/A;   ICD IMPLANT N/A 09/03/2018   Procedure: ICD Medtronic IMPLANT;  Surgeon: Regan Lemming, MD;  Location: [facility] INVASIVE CV LAB;  Service: Cardiovascular;  Laterality: N/A;   ILEOSTOMY N/A 12/28/2018   Procedure: ILEOSTOMY;  Surgeon: Emelia Loron, MD;  Location: [facility] OR;  Service: General;  Laterality: N/A;   IR RADIOLOGIST EVAL & MGMT  02/08/2019   JOINT REPLACEMENT     right knee   LEFT AND RIGHT HEART CATHETERIZATION WITH CORONARY ANGIOGRAM N/A 12/22/2011   Procedure: LEFT AND RIGHT HEART CATHETERIZATION WITH CORONARY ANGIOGRAM;  Surgeon:  Dolores Patty, MD;  Location: [facility] CATH LAB;  Service: Cardiovascular;  Laterality: N/A;   PATELLAR TENDON REPAIR     Left   RIGHT HEART CATH N/A 10/20/2016   Procedure: RIGHT HEART CATH;  Surgeon: Dolores Patty, MD;  Location: [facility] INVASIVE CV LAB;  Service: Cardiovascular;  Laterality: N/A;   RIGHT HEART CATH N/A 04/17/2020   Procedure: RIGHT HEART CATH;  Surgeon: Dolores Patty, MD;  Location: [facility] INVASIVE CV LAB;  Service: Cardiovascular;  Laterality: N/A;   SKIN SPLIT GRAFT N/A 09/05/2019   Procedure: SKIN GRAFT SPLIT THICKNESS;  Surgeon: Allena Napoleon, MD;  Location: [facility] OR;  Service: Plastics;  Laterality: N/A;   TEE WITHOUT CARDIOVERSION N/A 03/20/2017   Procedure: TRANSESOPHAGEAL ECHOCARDIOGRAM (TEE);  Surgeon: Dolores Patty, MD;  Location: [facility] ENDOSCOPY;  Service: Cardiovascular;  Laterality: N/A;       Family History  Problem Relation Age of Onset   Hypertension Mother    Clotting disorder Mother        mom with PEs   Cancer Mother        uterine cancer   Diabetes Father    Heart attack Father    Hypertension Other        Sibling   Diabetes Other    Obesity Other    Diabetes Sister    Obesity Sister    Obesity Sister    Colon cancer Neg Hx    Stomach cancer Neg Hx    Esophageal cancer Neg Hx    Rectal cancer Neg Hx    Inflammatory bowel disease Neg Hx    Liver disease Neg Hx    Pancreatic cancer Neg Hx     Social History   Tobacco Use   Smoking status: Never   Smokeless tobacco: Never  Vaping Use   Vaping Use: Never used  Substance Use Topics   Alcohol use: Not Currently    Comment: Rarely   Drug use: No    Home Medications Prior to Admission medications   Medication Sig Start Date End Date Taking? Authorizing Provider  amiodarone (PACERONE) 200 MG tablet Take 200 mg by mouth 2 (two) times daily.   Yes [provider]  carvedilol (COREG) 6.25 MG tablet Take 1 tablet (6.25 mg total) by mouth 2 (two) times daily with a meal.  06/10/20  Yes Duke Salvia, MD  dapagliflozin propanediol (FARXIGA) 10 MG TABS tablet Take 1 tablet (10 mg total) by mouth daily. 05/29/20  Yes Laurey Morale, MD  digoxin (LANOXIN) 0.125 MG tablet Take 0.5 tablets (0.0625 mg total) by mouth daily. 06/22/20  Yes Bensimhon, Bevelyn Buckles, MD  ELIQUIS 5 MG TABS tablet TAKE 1 TABLET(5 MG) BY MOUTH TWICE DAILY Patient taking differently: Take 5 mg by mouth 2 (two) times daily. 04/27/20  Yes Robbie LisSimmons, Brittainy M, PA-C  mexiletine (MEXITIL) 150 MG capsule Take 2 capsules (300 mg total) by mouth every 12 (twelve) hours. 06/10/20  Yes Duke SalviaKlein, Steven C, MD  pantoprazole (PROTONIX) 40 MG tablet Take 1 tablet (40 mg total) by mouth daily. 06/22/20  Yes Bensimhon, Bevelyn Bucklesaniel R, MD  potassium chloride SA (KLOR-CON) 20 MEQ tablet Take 2 tablets (40 mEq total) by mouth daily. 07/11/20  Yes Bensimhon, Bevelyn Bucklesaniel R, MD  ranolazine (RANEXA) 500 MG 12 hr tablet Take 1 tablet (500 mg total) by mouth 2 (two) times daily. 06/10/20  Yes Duke SalviaKlein, Steven C, MD  spironolactone (ALDACTONE) 25 MG tablet Take 2 tablets (50 mg total) by mouth daily. 06/10/20  Yes Duke SalviaKlein, Steven C, MD  torsemide (DEMADEX) 20 MG tablet Take 2 tablets (40 mg total) by mouth 2 (two) times daily. 05/24/20  Yes Melene PlanFloyd, Dan, DO    Allergies    Shrimp [shellfish allergy]  Review of Systems   Review of Systems  Constitutional:  Negative for appetite change, chills, diaphoresis and fever.  HENT:  Negative for congestion and sore throat.   Eyes:  Negative for visual disturbance.  Respiratory:  Positive for cough and shortness of breath. Negative for choking and wheezing.   Cardiovascular:  Negative for chest pain, palpitations and leg swelling.  Gastrointestinal:  Negative for abdominal pain, diarrhea, nausea and vomiting.  Genitourinary:  Negative for dysuria.  Musculoskeletal:  Negative for back pain, gait problem, myalgias and neck pain.  Skin:  Negative for rash and wound.  Allergic/Immunologic: Negative for  immunocompromised state.  Neurological:  Negative for dizziness, seizures, syncope, weakness, light-headedness, numbness and headaches.  Psychiatric/Behavioral:  Negative for confusion.    Physical Exam Updated Vital Signs BP 102/70 (BP Location: Right Arm)   Pulse 62   Temp 98.5 F (36.9 C) (Oral)   Resp 20   SpO2 94%   Physical Exam Vitals and nursing note reviewed.  Constitutional:      General: He is not in acute distress.    Appearance: He is well-developed. He is not ill-appearing, toxic-appearing or diaphoretic.     Comments: Pleasant.  No acute distress.  HENT:     Head: Normocephalic.  Eyes:     Conjunctiva/sclera: Conjunctivae normal.  Cardiovascular:     Rate and Rhythm: Normal rate and regular rhythm.     Heart sounds: No murmur heard. Pulmonary:     Effort: Pulmonary effort is normal. No respiratory distress.     Breath sounds: No stridor.     Comments: Crackles in the bilateral bases.  No increased work of breathing. Chest:     Chest wall: No tenderness.  Abdominal:     General: There is distension.     Palpations: Abdomen is soft. There is no mass.     Tenderness: There is no abdominal tenderness. There is no right CVA tenderness, left CVA tenderness, guarding or rebound.     Hernia: No hernia is present.     Comments: Abdomen is distended but soft and nontender.  Musculoskeletal:        General: No tenderness.     Cervical back: Neck supple.     Right lower leg: Edema present.     Left lower leg: Edema present.     Comments: Pitting edema noted to the bilateral lower extremities.  Skin:    General: Skin is warm and dry.  Neurological:     Mental Status: He is alert.  Psychiatric:  Behavior: Behavior normal.    ED Results / Procedures / Treatments   Labs (all labs ordered are listed, but only abnormal results are displayed) Labs Reviewed  CBC WITH DIFFERENTIAL/PLATELET - Abnormal; Notable for the following components:      Result Value    Abs Immature Granulocytes 0.16 (*)    All other components within normal limits  BASIC METABOLIC PANEL - Abnormal; Notable for the following components:   Sodium 134 (*)    CO2 21 (*)    Glucose, Bld 137 (*)    Creatinine, Ser 2.23 (*)    Calcium 8.5 (*)    GFR, Estimated 34 (*)    All other components within normal limits  BRAIN NATRIURETIC PEPTIDE - Abnormal; Notable for the following components:   B Natriuretic Peptide 959.9 (*)    All other components within normal limits  TROPONIN I (HIGH SENSITIVITY) - Abnormal; Notable for the following components:   Troponin I (High Sensitivity) 46 (*)    All other components within normal limits  RESP PANEL BY RT-PCR (FLU A&B, COVID) ARPGX2  MAGNESIUM    EKG EKG Interpretation  Date/Time:  Thursday July 19 2020 03:13:37 EDT Ventricular Rate:  113 PR Interval:  124 QRS Duration: 187 QT Interval:  418 QTC Calculation: 574 R Axis:   -84 Text Interpretation: Sinus tachycardia Multiform ventricular premature complexes Probable left atrial enlargement Left bundle branch block No significant change was found Confirmed by Glynn Octave [phone]) on 07/19/2020 3:19:47 AM  Radiology DG Chest 2 View  Result Date: 07/19/2020 CLINICAL DATA:  Shortness of breath for a few days. EXAM: CHEST - 2 VIEW COMPARISON:  06/01/2020 FINDINGS: Cardiac pacemaker. Cardiac enlargement with pulmonary vascular congestion. New perihilar infiltrates likely representing edema. No pleural effusions. No pneumothorax. IMPRESSION: Cardiac enlargement with pulmonary vascular congestion and perihilar edema. Electronically Signed   By: Burman Nieves M.D.   On: 07/19/2020 03:31    Procedures Procedures   Medications Ordered in ED Medications  sodium chloride flush (NS) 0.9 % injection 3 mL (has no administration in time range)  sodium chloride flush (NS) 0.9 % injection 3 mL (has no administration in time range)  0.9 %  sodium chloride infusion (has no administration in  time range)  acetaminophen (TYLENOL) tablet 650 mg (has no administration in time range)  ondansetron (ZOFRAN) injection 4 mg (has no administration in time range)  furosemide (LASIX) injection 80 mg (80 mg Intravenous Given 07/19/20 0407)    ED Course  I have reviewed the triage vital signs and the nursing notes.  Pertinent labs & imaging results that were available during my care of the patient were reviewed by me and considered in my medical decision making (see chart for details).  Clinical Course as of 07/19/20 0505  Thu Jul 19, 2020  0408 Discussed with Dr. Daphine Deutscher, cardiology.  Recommends admission to the hospital service.  He will be evaluated by the heart failure team in the a.m. [MM]    Clinical Course User Index [MM] Alvy Bimler   MDM Rules/Calculators/A&P                          55 year old male with a history of chronic combined systolic and diastolic congestive heart failure, CAD, morbid obesity, nonischemic cardiomyopathy (EF 25 to 30%), recurrent DVT with resultant PE, PLF with ALF ablation in November 2018, history of LV thrombus, PSVT, VT with prior ICD shock, chronic venous insufficiency, diverticulitis complicated  by perforated colon and nonhealing abdominal wound s/p skin grafting and ileostomy who presents the emergency department with a chief complaint of shortness of breath.  He has been having intermittent shortness of breath and a nonproductive cough at night accompanied by orthopnea for the last 3 days.  He has been compliant with his home torsemide as well as his other home medications.  No recent dietary changes.  Patient adamantly denies chest pain or constitutional symptoms.  The patient has been seen and independently evaluated by Dr. Manus Gunning, attending physician.  Labs and imaging of been reviewed and independently interpreted by me.  Exam is concerning for volume overload as he has crackles in the bilateral bases, pitting edema, and is endorsing  abdominal distention.  Chest x-ray with pulmonary vascular congestion and perihilar edema.  BNP is 959, up from the patient's baseline of around 300.  Per chart review, he has had about an 11 pound increase at his cardiology visits over the last month despite reporting stable and slightly downtrending daily weights at home.  Troponin is 46 today.  This is increased from previous, but patient's creatinine is 2.23 today.  This is stable from labs in the office on June 10, but is elevated from his last admission in May.  Rise in troponin does seem to correlate with uptrending creatinine.  No acute changes noted on his EKG, which does demonstrate sinus tachycardia with multiform ventricular premature complexes and a left bundle branch block.  Potassium is stable at 3.6.  Labs are otherwise unremarkable. Will be the patient and monitor strict intake and output.  80 mg of IV Lasix has been ordered.  Vitals are stable and he has no hypoxia or tachypnea at this time.  Discussed the patient with Dr. Daphine Deutscher, cardiology.    She recommends hospitalist consult for admission and the heart failure team will evaluate the patient in the a.m.  Patient is agreeable with this plan.  Consult to the hospitalist service and Dr. Julian Reil will accept the patient for admission.  The patient appears reasonably stabilized for admission considering the current resources, flow, and capabilities available in the ED at this time, and I doubt any other [facility] requiring further screening and/or treatment in the ED prior to admission.   Final Clinical Impression(s) / ED Diagnoses Final diagnoses:  Acute on chronic combined systolic and diastolic congestive heart failure [facility])    Rx / DC Orders ED Discharge Orders     None        Barkley Boards, PA-C 07/19/20 0505    Glynn Octave, MD 07/19/20 [phone]

## 2020-07-20 DIAGNOSIS — I5043 Acute on chronic combined systolic (congestive) and diastolic (congestive) heart failure: Secondary | ICD-10-CM | POA: Diagnosis not present

## 2020-07-20 DIAGNOSIS — Z8249 Family history of ischemic heart disease and other diseases of the circulatory system: Secondary | ICD-10-CM | POA: Diagnosis not present

## 2020-07-20 DIAGNOSIS — Z9049 Acquired absence of other specified parts of digestive tract: Secondary | ICD-10-CM | POA: Diagnosis not present

## 2020-07-20 DIAGNOSIS — R748 Abnormal levels of other serum enzymes: Secondary | ICD-10-CM | POA: Diagnosis not present

## 2020-07-20 DIAGNOSIS — Z86711 Personal history of pulmonary embolism: Secondary | ICD-10-CM | POA: Diagnosis not present

## 2020-07-20 DIAGNOSIS — Z91013 Allergy to seafood: Secondary | ICD-10-CM | POA: Diagnosis not present

## 2020-07-20 DIAGNOSIS — I248 Other forms of acute ischemic heart disease: Secondary | ICD-10-CM | POA: Diagnosis present

## 2020-07-20 DIAGNOSIS — I471 Supraventricular tachycardia: Secondary | ICD-10-CM | POA: Diagnosis present

## 2020-07-20 DIAGNOSIS — Z79899 Other long term (current) drug therapy: Secondary | ICD-10-CM | POA: Diagnosis not present

## 2020-07-20 DIAGNOSIS — Z86718 Personal history of other venous thrombosis and embolism: Secondary | ICD-10-CM | POA: Diagnosis not present

## 2020-07-20 DIAGNOSIS — Z7984 Long term (current) use of oral hypoglycemic drugs: Secondary | ICD-10-CM | POA: Diagnosis not present

## 2020-07-20 DIAGNOSIS — I2510 Atherosclerotic heart disease of native coronary artery without angina pectoris: Secondary | ICD-10-CM | POA: Diagnosis present

## 2020-07-20 DIAGNOSIS — Z96651 Presence of right artificial knee joint: Secondary | ICD-10-CM | POA: Diagnosis present

## 2020-07-20 DIAGNOSIS — I5023 Acute on chronic systolic (congestive) heart failure: Secondary | ICD-10-CM | POA: Diagnosis not present

## 2020-07-20 DIAGNOSIS — I872 Venous insufficiency (chronic) (peripheral): Secondary | ICD-10-CM | POA: Diagnosis present

## 2020-07-20 DIAGNOSIS — D6862 Lupus anticoagulant syndrome: Secondary | ICD-10-CM | POA: Diagnosis present

## 2020-07-20 DIAGNOSIS — Z20822 Contact with and (suspected) exposure to covid-19: Secondary | ICD-10-CM | POA: Diagnosis present

## 2020-07-20 DIAGNOSIS — I428 Other cardiomyopathies: Secondary | ICD-10-CM | POA: Diagnosis present

## 2020-07-20 DIAGNOSIS — I472 Ventricular tachycardia: Secondary | ICD-10-CM | POA: Diagnosis not present

## 2020-07-20 DIAGNOSIS — I509 Heart failure, unspecified: Secondary | ICD-10-CM

## 2020-07-20 DIAGNOSIS — I4819 Other persistent atrial fibrillation: Secondary | ICD-10-CM | POA: Diagnosis not present

## 2020-07-20 DIAGNOSIS — Z8679 Personal history of other diseases of the circulatory system: Secondary | ICD-10-CM | POA: Diagnosis not present

## 2020-07-20 DIAGNOSIS — Z7901 Long term (current) use of anticoagulants: Secondary | ICD-10-CM | POA: Diagnosis not present

## 2020-07-20 DIAGNOSIS — I484 Atypical atrial flutter: Secondary | ICD-10-CM | POA: Diagnosis not present

## 2020-07-20 DIAGNOSIS — Z6841 Body Mass Index (BMI) 40.0 and over, adult: Secondary | ICD-10-CM | POA: Diagnosis not present

## 2020-07-20 DIAGNOSIS — Z95810 Presence of automatic (implantable) cardiac defibrillator: Secondary | ICD-10-CM | POA: Diagnosis not present

## 2020-07-20 DIAGNOSIS — N1832 Chronic kidney disease, stage 3b: Secondary | ICD-10-CM | POA: Diagnosis not present

## 2020-07-20 LAB — BASIC METABOLIC PANEL
Anion gap: 12 (ref 5–15)
BUN: 17 mg/dL (ref 6–20)
CO2: 25 mmol/L (ref 22–32)
Calcium: 8.8 mg/dL — ABNORMAL LOW (ref 8.9–10.3)
Chloride: 98 mmol/L (ref 98–111)
Creatinine, Ser: 2.15 mg/dL — ABNORMAL HIGH (ref 0.61–1.24)
GFR, Estimated: 36 mL/min — ABNORMAL LOW (ref >60)
Glucose, Bld: 137 mg/dL — ABNORMAL HIGH (ref 70–99)
Potassium: 3.8 mmol/L (ref 3.5–5.1)
Sodium: 135 mmol/L (ref 135–145)

## 2020-07-20 LAB — POTASSIUM: Potassium: 4.2 mmol/L (ref 3.5–5.1)

## 2020-07-20 LAB — DIGOXIN LEVEL: Digoxin Level: 0.6 ng/mL — ABNORMAL LOW (ref 0.8–2.0)

## 2020-07-20 MED ORDER — BENZONATATE 100 MG PO CAPS
100.0000 mg | ORAL_CAPSULE | Freq: Two times a day (BID) | ORAL | Status: DC | PRN
  Administered 2020-07-20 – 2020-07-21 (×2): 100 mg via ORAL
  Filled 2020-07-20 (×3): qty 1

## 2020-07-20 NOTE — Plan of Care (Signed)
  Problem: Clinical Measurements: Goal: Diagnostic test results will improve Outcome: Progressing Goal: Respiratory complications will improve Outcome: Progressing   Problem: Activity: Goal: Risk for activity intolerance will decrease Outcome: Progressing   Problem: Elimination: Goal: Will not experience complications related to bowel motility Outcome: Progressing Goal: Will not experience complications related to urinary retention Outcome: Progressing

## 2020-07-20 NOTE — Progress Notes (Addendum)
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  Symptoms associated with orthopnea and while attempting to sleep.  Patient reports swelling in his abdomen is worse than his baseline with increased lower extremity edema.  Patient also reports nonproductive cough.  Denies chest pain, no dizziness, no fever/chills, no nausea/vomiting/diarrhea.  In the ED, temperature 98.4 F, HR 79, RR 18, BP 121/75, SPO2 94% on room air.  Sodium 134, potassium 3.6, chloride 100, CO2 21, glucose 137, BUN 17, creatinine 2.23, magnesium 2.0.  WBC 9.3, hemoglobin 15.7, platelets 213.  BNP 959.9.  High sensitive troponin 46.  Cova-19 PCR negative.  Influenza A/B PCR negative.  EKG with sinus tachycardia.  Chest x-ray with cardiac enlargement with pulmonary vascular congestion and perihilar edema.  Patient was given furosemide 80 mg IV x1 in the ED.  Cardiology consulted.  TRH consulted for further evaluation and management of acute on chronic, on systolic and diastolic congestive heart failure.   Assessment & Plan:   Principal Problem:   Acute on chronic combined systolic and diastolic CHF (congestive heart failure) (HCC) Active Problems:   Chronic anticoagulation   Atypical atrial flutter (HCC)   History of DVT (deep vein thrombosis)   History of creation of ostomy (HCC)   Ventricular tachyarrhythmia (HCC)   Chronic kidney disease, stage 3b (HCC)   Acute on chronic combined systolic and diastolic congestive heart failure Patient presenting to the ED  with progressive shortness of breath associated with worsening abdominal/lower extremity edema.  Patient was noted to have elevated BNP with vascular/perihilar congestion and edema on chest x-ray.  Patient is followed by cardiology/heart failure service outpatient; he is nearing end-stage and not a candidate for advanced therapies with patient's size, RV dysfunction, CKD and ileostomy. --net negative 2.4L past 24h --Furosemide 80 mg IV every 12 hours --Spironolactone 12.5 mg p.o. daily --Digoxin 0.0625 mcg p.o. daily --Carvedilol reduced to 3.125 mg twice daily --Ranolazine 500 mg p.o. twice daily --Strict I's and O's and daily weights --Continue to monitor on telemetry  CKD stage IIIb Creatinine 2.23 on admission, baseline 2.2-2.6. --Avoid nephrotoxins, renally dose all medications --BMP in a.m.  History of ventricular tachycardia s/p AICD Patient follows with electrophysiology, Dr. Elberta Fortis outpatient.  No VT on device interrogation. --Continue amiodarone 200 mg p.o. twice daily --Continue mexiletine --Continue monitor potassium and magnesium levels daily --Continue telemetry  Hx atrial flutter status post ablation --Continue amiodarone --Apixaban 5 mg p.o. twice daily  History of diverticulitis s/p colon resection with ostomy --Wound care evaluation for continued ostomy care  History of recurrent DVT/PE on anticoagulation Right lower extremity more edematous than left lower extremity.  Reports compliance with anticoagulation.  Vascular duplex ultrasound right lower extremity negative for DVT. --Continue Eliquis 5 mg p.o. twice daily  Morbid obesity Discussed with patient needs for aggressive lifestyle changes/weight loss as this complicates all facets of care.  Outpatient follow-up with PCP.  May benefit from bariatric evaluation outpatient.  Ethics: Patient with recurrent hospitalizations for congestive heart failure exacerbations, nearing end stage per cardiology and not  candidate for advanced therapies.  Palliative care consulted for assistance with goals of care and medical decision making.  DVT prophylaxis:  apixaban (ELIQUIS) tablet 5 mg    Code Status: Full Code Family Communication: No family present at bedside this morning  Disposition Plan:  Level of care: Telemetry Cardiac Status is: Observation  The patient will require care spanning > 2 midnights and should be moved to inpatient because: IV treatments appropriate due to intensity of illness or inability to take PO and Inpatient level of care appropriate due to severity of illness  Dispo: The patient is from: Home              Anticipated d/c is to: Home              Patient currently is not medically stable to d/c.   Difficult to place patient No   Consultants:  Cardiology/heart failure service  Procedures:  None  Antimicrobials:  None   Subjective: Patient seen examined bedside, resting comfortably.  Sitting at edge of bed.  Complaining of dry cough.  Continues with good diuresis with IV Lasix.  Seen by heart failure service this morning, recommended continuing IV diuresis for another 24 hours before considering transition to torsemide at higher dose.  Patient with no other questions or concerns at this time. Denies headache, no fever/chills/night sweats, no nausea/vomiting/diarrhea, no chest pain, no palpitations, no abdominal pain, no weakness, no fatigue, no paresthesias.  No acute events overnight per nursing staff.  Objective: Vitals:   07/20/20 0419 07/20/20 0755 07/20/20 1107 07/20/20 1212  BP: 97/78 104/72 103/74 103/74  Pulse: 80 81 83 83  Resp: 20 20 20 20   Temp: 98.3 F (36.8 C) 98.4 F (36.9 C) 98.3 F (36.8 C) 98.3 F (36.8 C)  TempSrc: Oral Oral Oral Oral  SpO2: 90% 92% 94%   Weight: (!) 151.9 kg   (!) 151.9 kg  Height:    5\' 8"  (1.727 m)    Intake/Output Summary (Last 24 hours) at 07/20/2020 1302 Last data filed at 07/20/2020 1200 Gross per 24 hour  Intake  1680 ml  Output 2795 ml  Net -1115 ml   Filed Weights   07/20/20 0419 07/20/20 1212  Weight: (!) 151.9 kg (!) 151.9 kg    Examination:  General exam: Appears calm and comfortable, morbidly obese Respiratory system: Decreased breath sounds bilateral bases with crackles, normal respiratory effort without accessory muscle use, on room air at rest Cardiovascular system: S1 & S2 heard, RRR. No JVD, murmurs, rubs, gallops or clicks.  3+ pitting edema bilateral lower extremities up to knee, asymmetric with right greater than left Gastrointestinal system: Abdomen is nondistended, soft and nontender. No organomegaly or masses felt. Normal bowel sounds heard. Central nervous system: Alert and oriented. No focal neurological deficits. Extremities: Symmetric 5 x 5 power. Skin: No rashes, lesions or ulcers Psychiatry: Judgement and insight appear normal. Mood & affect appropriate.     Data Reviewed: I have personally reviewed following labs and imaging studies  CBC: Recent Labs  Lab 07/19/20 0222  WBC 9.3  NEUTROABS 6.5  HGB 15.7  HCT 47.4  MCV 92.9  PLT 213   Basic Metabolic Panel: Recent Labs  Lab 07/19/20 0222 07/19/20 1403 07/20/20 0512  NA 134*  --  135  K 3.6 4.1 3.8  CL 100  --  98  CO2 21*  --  25  GLUCOSE 137*  --  137*  BUN 17  --  17  CREATININE 2.23*  --  2.15*  CALCIUM 8.5*  --  8.8*  MG 2.0  --   --    GFR: Estimated Creatinine Clearance: 56.6 mL/min (A) (by C-G formula based on SCr of 2.15 mg/dL (H)). Liver Function Tests: No results for input(s): AST, ALT, ALKPHOS, BILITOT, PROT, ALBUMIN in the last 168 hours. No results for input(s): LIPASE, AMYLASE in the last 168 hours. No results for input(s): AMMONIA in the last 168 hours. Coagulation Profile: No results for input(s): INR, PROTIME in the last 168 hours. Cardiac Enzymes: No results for input(s): CKTOTAL, CKMB, CKMBINDEX, TROPONINI in the last 168 hours. BNP (last 3 results) No results for input(s):  PROBNP in the last 8760 hours. HbA1C: No results for input(s): HGBA1C in the last 72 hours. CBG: No results for input(s): GLUCAP in the last 168 hours. Lipid Profile: No results for input(s): CHOL, HDL, LDLCALC, TRIG, CHOLHDL, LDLDIRECT in the last 72 hours. Thyroid Function Tests: No results for input(s): TSH, T4TOTAL, FREET4, T3FREE, THYROIDAB in the last 72 hours. Anemia Panel: No results for input(s): VITAMINB12, FOLATE, FERRITIN, TIBC, IRON, RETICCTPCT in the last 72 hours. Sepsis Labs: No results for input(s): PROCALCITON, LATICACIDVEN in the last 168 hours.  Recent Results (from the past 240 hour(s))  Resp Panel by RT-PCR (Flu A&B, Covid) Nasopharyngeal Swab     Status: None   Collection Time: 07/19/20  4:03 AM   Specimen: Nasopharyngeal Swab; Nasopharyngeal(NP) swabs in vial transport medium  Result Value Ref Range Status   SARS Coronavirus 2 by RT PCR NEGATIVE NEGATIVE Final    Comment: (NOTE) SARS-CoV-2 target nucleic acids are NOT DETECTED.  The SARS-CoV-2 RNA is generally detectable in upper respiratory specimens during the acute phase of infection. The lowest concentration of SARS-CoV-2 viral copies this assay can detect is 138 copies/mL. A negative result does not preclude SARS-Cov-2 infection and should not be used as the sole basis for treatment or other patient management decisions. A negative result may occur with  improper specimen collection/handling, submission of specimen other than nasopharyngeal swab, presence of viral mutation(s) within the areas targeted by this assay, and inadequate number of viral copies(<138 copies/mL). A negative result must be combined with clinical observations, patient history, and epidemiological information. The expected result is Negative.  Fact Sheet for Patients:  BloggerCourse.comhttps://www.fda.gov/media/152166/download  Fact Sheet for Healthcare Providers:  SeriousBroker.ithttps://www.fda.gov/media/152162/download  This test is no t yet approved or  cleared by the [place] FDA and  has been authorized for detection and/or diagnosis of SARS-CoV-2 by FDA under an Emergency Use Authorization (EUA). This EUA will remain  in effect (meaning this test can be used) for the duration of the COVID-19 declaration under Section 564(b)(1) of the Act, 21 U.S.C.section 360bbb-3(b)(1), unless the authorization is terminated  or revoked sooner.       Influenza A by PCR NEGATIVE NEGATIVE Final   Influenza B by PCR NEGATIVE NEGATIVE Final    Comment: (NOTE) The Xpert Xpress SARS-CoV-2/FLU/RSV plus assay is intended as an aid in the diagnosis of influenza from Nasopharyngeal swab specimens and should not be used as a sole basis for treatment. Nasal washings and aspirates are unacceptable for Xpert Xpress SARS-CoV-2/FLU/RSV testing.  Fact Sheet for Patients: BloggerCourse.comhttps://www.fda.gov/media/152166/download  Fact Sheet for Healthcare Providers: SeriousBroker.ithttps://www.fda.gov/media/152162/download  This test is not yet approved or cleared by the [place] FDA and has been authorized for detection and/or diagnosis of SARS-CoV-2 by FDA under an Emergency Use Authorization (EUA). This EUA will remain in effect (meaning this test can be used) for  the duration of the COVID-19 declaration under Section 564(b)(1) of the Act, 21 U.S.C. section 360bbb-3(b)(1), unless the authorization is terminated or revoked.  Performed at [facility], [address], [place], [place] [address]          Radiology Studies: DG Chest 2 View  Result Date: 07/19/2020 CLINICAL DATA:  Shortness of breath for a few days. EXAM: CHEST - 2 VIEW COMPARISON:  06/01/2020 FINDINGS: Cardiac pacemaker. Cardiac enlargement with pulmonary vascular congestion. New perihilar infiltrates likely representing edema. No pleural effusions. No pneumothorax. IMPRESSION: Cardiac enlargement with pulmonary vascular congestion and perihilar edema. Electronically Signed   By: Burman Nieves M.D.    On: 07/19/2020 03:31   VAS [place] LOWER EXTREMITY VENOUS (DVT)  Result Date: 07/19/2020  Lower Venous DVT Study Patient Name:  MENNO VANBERGEN [facility]  Date of Exam:   07/19/2020 Medical Rec #: [id number]       Accession #:    [id number] Date of Birth: 08-14-1965       Patient Gender: M Patient Age:   52Y Exam Location:  [facility] Procedure:      VAS [place] LOWER EXTREMITY VENOUS (DVT) Referring Phys: [id number] Khristopher Kapaun J [place] --------------------------------------------------------------------------------  Indications: Edema, and SOB.  Comparison Study: Previous 11/21/2014 - LLE chronic DVT FV and PopV Performing Technologist: Jody Hill RVT, RDMS  Examination Guidelines: A complete evaluation includes B-mode imaging, spectral Doppler, color Doppler, and power Doppler as needed of all accessible portions of each vessel. Bilateral testing is considered an integral part of a complete examination. Limited examinations for reoccurring indications may be performed as noted. The reflux portion of the exam is performed with the patient in reverse Trendelenburg.  +---------+---------------+---------+-----------+----------+-------------------+ RIGHT    CompressibilityPhasicitySpontaneityPropertiesThrombus Aging      +---------+---------------+---------+-----------+----------+-------------------+ CFV      Full           Yes      Yes                                      +---------+---------------+---------+-----------+----------+-------------------+ SFJ      Full                                                             +---------+---------------+---------+-----------+----------+-------------------+ FV Prox  Full           Yes      Yes                                      +---------+---------------+---------+-----------+----------+-------------------+ FV Mid   Full           Yes      Yes                                       +---------+---------------+---------+-----------+----------+-------------------+ FV DistalFull           Yes      Yes                                      +---------+---------------+---------+-----------+----------+-------------------+  PFV      Full                                                             +---------+---------------+---------+-----------+----------+-------------------+ POP      Full           Yes      Yes                                      +---------+---------------+---------+-----------+----------+-------------------+ PTV      Full                                         Not well visualized +---------+---------------+---------+-----------+----------+-------------------+ PERO     Full                                         Not well visualized +---------+---------------+---------+-----------+----------+-------------------+   +----+---------------+---------+-----------+----------+--------------+ LEFTCompressibilityPhasicitySpontaneityPropertiesThrombus Aging +----+---------------+---------+-----------+----------+--------------+ CFV Full           Yes      Yes                                 +----+---------------+---------+-----------+----------+--------------+    Summary: RIGHT: - There is no evidence of deep vein thrombosis in the lower extremity. - There is no evidence of superficial venous thrombosis.  - No cystic structure found in the popliteal fossa.  LEFT: - No evidence of common femoral vein obstruction.  *See table(s) above for measurements and observations. Electronically signed by Sherald Hess MD on 07/19/2020 at 5:53:04 PM.    Final         Scheduled Meds:  amiodarone  200 mg Oral BID   apixaban  5 mg Oral BID   carvedilol  3.125 mg Oral BID WC   dapagliflozin propanediol  10 mg Oral Daily   digoxin  0.0625 mg Oral Daily   furosemide  80 mg Intravenous BID   mexiletine  300 mg Oral Q12H   pantoprazole  40 mg Oral Daily    potassium chloride SA  40 mEq Oral Daily   ranolazine  500 mg Oral BID   sodium chloride flush  3 mL Intravenous Q12H   spironolactone  50 mg Oral Daily   Continuous Infusions:  sodium chloride       LOS: 0 days    Time spent: 40 minutes spent on chart review, discussion with nursing staff, consultants, updating family and interview/physical exam; more than 50% of that time was spent in counseling and/or coordination of care.    Alvira Philips [place], DO [facility] Available via Epic secure chat 7am-7pm After these hours, please refer to coverage provider listed on [email or web address] 07/20/2020, 1:02 PM

## 2020-07-20 NOTE — Progress Notes (Signed)
Advanced Heart Failure Rounding Note  PCP-Cardiologist: Arvilla Meres, MD   Subjective:   07/07: Admitted with acute on chronic systolic HF. Diuresing with IV lasix.  Diuresed well overnight. Weight down 7#, - 2.4L last 24 hrs. Orthopnea improved. Continues to have a dry cough. No dyspnea sitting upright.    Objective:   Weight Range: (!) 151.9 kg Body mass index is 50.92 kg/m.   Vital Signs:   Temp:  [97.6 F (36.4 C)-98.4 F (36.9 C)] 98.3 F (36.8 C) (07/08 0419) Pulse Rate:  [74-80] 80 (07/08 0419) Resp:  [17-29] 20 (07/08 0419) BP: (97-118)/(71-89) 97/78 (07/08 0419) SpO2:  [89 %-97 %] 90 % (07/08 0419) Weight:  [151.9 kg] 151.9 kg (07/08 0419) Last BM Date: 07/19/20  Weight change: Filed Weights   07/20/20 0419  Weight: (!) 151.9 kg    Intake/Output:   Intake/Output Summary (Last 24 hours) at 07/20/2020 0752 Last data filed at 07/20/2020 0210 Gross per 24 hour  Intake 1440 ml  Output 3475 ml  Net -2035 ml      Physical Exam    General:  Well appearing. No resp difficulty HEENT: Normal Neck: Supple. + JVD, difficult to assess d/t neck size . Carotids 2+ bilat; no bruits. No lymphadenopathy or thyromegaly appreciated. Cor: PMI nondisplaced. Regular rate & rhythm. No rubs, gallops or murmurs. Lungs: Clear Abdomen: Soft, nontender, nondistended. No hepatosplenomegaly. No bruits or masses. Good bowel sounds. Extremities: No cyanosis, clubbing, rash, 1+ lower leg edema (improved) Neuro: Alert & orientedx3, cranial nerves grossly intact. moves all 4 extremities w/o difficulty. Affect pleasant   Telemetry   NSR   Labs    CBC Recent Labs    07/19/20 0222  WBC 9.3  NEUTROABS 6.5  HGB 15.7  HCT 47.4  MCV 92.9  PLT 213   Basic Metabolic Panel Recent Labs    09/62/83 0222 07/19/20 1403 07/20/20 0512  NA 134*  --  135  K 3.6 4.1 3.8  CL 100  --  98  CO2 21*  --  25  GLUCOSE 137*  --  137*  BUN 17  --  17  CREATININE 2.23*  --  2.15*   CALCIUM 8.5*  --  8.8*  MG 2.0  --   --    Liver Function Tests No results for input(s): AST, ALT, ALKPHOS, BILITOT, PROT, ALBUMIN in the last 72 hours. No results for input(s): LIPASE, AMYLASE in the last 72 hours. Cardiac Enzymes No results for input(s): CKTOTAL, CKMB, CKMBINDEX, TROPONINI in the last 72 hours.  BNP: BNP (last 3 results) Recent Labs    05/24/20 0759 06/01/20 0909 07/19/20 0222  BNP 583.6* 773.7* 959.9*    ProBNP (last 3 results) No results for input(s): PROBNP in the last 8760 hours.   D-Dimer No results for input(s): DDIMER in the last 72 hours. Hemoglobin A1C No results for input(s): HGBA1C in the last 72 hours. Fasting Lipid Panel No results for input(s): CHOL, HDL, LDLCALC, TRIG, CHOLHDL, LDLDIRECT in the last 72 hours. Thyroid Function Tests No results for input(s): TSH, T4TOTAL, T3FREE, THYROIDAB in the last 72 hours.  Invalid input(s): FREET3  Other results:   Imaging    VAS [place] LOWER EXTREMITY VENOUS (DVT)  Result Date: 07/19/2020  Lower Venous DVT Study Patient Name:  Roberto [name] [facility]  Date of Exam:   07/19/2020 Medical Rec #: [id number]       Accession #:    [id number] Date of Birth: September 05, 1965  Patient Gender: M Patient Age:   55Y Exam Location:  [facility] Procedure:      VAS [place] LOWER EXTREMITY VENOUS (DVT) Referring Phys: [id number] ERIC J [place] --------------------------------------------------------------------------------  Indications: Edema, and SOB.  Comparison Study: Previous 11/21/2014 - LLE chronic DVT FV and PopV Performing Technologist: Jody Hill RVT, RDMS  Examination Guidelines: A complete evaluation includes B-mode imaging, spectral Doppler, color Doppler, and power Doppler as needed of all accessible portions of each vessel. Bilateral testing is considered an integral part of a complete examination. Limited examinations for reoccurring indications may be performed as noted. The reflux portion of the exam is performed with  the patient in reverse Trendelenburg.  +---------+---------------+---------+-----------+----------+-------------------+ RIGHT    CompressibilityPhasicitySpontaneityPropertiesThrombus Aging      +---------+---------------+---------+-----------+----------+-------------------+ CFV      Full           Yes      Yes                                      +---------+---------------+---------+-----------+----------+-------------------+ SFJ      Full                                                             +---------+---------------+---------+-----------+----------+-------------------+ FV Prox  Full           Yes      Yes                                      +---------+---------------+---------+-----------+----------+-------------------+ FV Mid   Full           Yes      Yes                                      +---------+---------------+---------+-----------+----------+-------------------+ FV DistalFull           Yes      Yes                                      +---------+---------------+---------+-----------+----------+-------------------+ PFV      Full                                                             +---------+---------------+---------+-----------+----------+-------------------+ POP      Full           Yes      Yes                                      +---------+---------------+---------+-----------+----------+-------------------+ PTV      Full  Not well visualized +---------+---------------+---------+-----------+----------+-------------------+ PERO     Full                                         Not well visualized +---------+---------------+---------+-----------+----------+-------------------+   +----+---------------+---------+-----------+----------+--------------+ LEFTCompressibilityPhasicitySpontaneityPropertiesThrombus Aging  +----+---------------+---------+-----------+----------+--------------+ CFV Full           Yes      Yes                                 +----+---------------+---------+-----------+----------+--------------+    Summary: RIGHT: - There is no evidence of deep vein thrombosis in the lower extremity. - There is no evidence of superficial venous thrombosis.  - No cystic structure found in the popliteal fossa.  LEFT: - No evidence of common femoral vein obstruction.  *See table(s) above for measurements and observations. Electronically signed by Sherald Hess MD on 07/19/2020 at 5:53:04 PM.    Final      Medications:     Scheduled Medications:  amiodarone  200 mg Oral BID   apixaban  5 mg Oral BID   carvedilol  3.125 mg Oral BID WC   dapagliflozin propanediol  10 mg Oral Daily   digoxin  0.0625 mg Oral Daily   furosemide  80 mg Intravenous BID   mexiletine  300 mg Oral Q12H   pantoprazole  40 mg Oral Daily   potassium chloride SA  40 mEq Oral Daily   ranolazine  500 mg Oral BID   sodium chloride flush  3 mL Intravenous Q12H   spironolactone  50 mg Oral Daily    Infusions:  sodium chloride      PRN Medications: sodium chloride, acetaminophen, ondansetron (ZOFRAN) IV, sodium chloride flush    Patient Profile   Roberto [name] is a 55 year old male admitted with acute on chronic systolic CHF. 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  Assessment/Plan  1. Acute on Chronic Systolic Heart Failure: - Echo 10/15/16 - LVEF 15%. - TEE 03/2017 LVEF 15-20%. - Echo 07/2017 with LVEF 20-25%. - Echo 7/20 EF 25-30%. - Echo 12/21 EF 25-30%. - Presenting with NYHA class IV symptoms. Given 80 mg lasix IV this am and initiated on 80 mg lasix IV  BID. Diuresing well. Improving but persistent dry cough. - Plan to transition to po torsemide tomorrow, potentially 60 mg BID (prior home dose 40 mg BID). - Continue Spiro 50 mg daily - Continue digoxin 0.0625 mcg daily. Dig level 0.6. - Carvedilol reduced to 3.125 mg BID d/t hypotension and acute on chronic CHF - Off Entresto due to recent AKI. - Continue farxiga 10 mg daily.  - Nearing end-stage. Not candidate for advanced therapies with size, RV dysfunction, CKD, and ileostomy. - Palliative care consult for GOC discussion d/t recurrent admissions. Previously wished to remain full code.   2. Elevated troponin -Mildly elevated HS troponin. -Likely d/t demand ischemia from #1 -No prior hx of CAD - negative cath in 2013   3. H/o VT - On 12/16/19 in setting of hypokalemia related to metolazone use. - Admitted 4/22 and 5/22 with recurrent VT.  -  Followed by Dr. Elberta Fortis as outpatient. - Not a candidate for ablation at [facility] - Now on amio 200 mg BID and mexilitene. - No driving for 6 months (~97/67). - No recurrent VT on ICD interrogation or telemetry - Monitor K closely with diuresis.   4. 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 - No arrhythmias on device check - Continue amiodarone. - On Eliquis 5 bid. No bleeding issues.   5. H/o Acute diverticulitis with colon resection and ileostomy - S/p skin grafting 09/05/19. - No plan for reversal of ileostomy after extensive abdominal hernia.   6. CKD Stage IIIb - Creatinine baseline 1.8-2.1 - Creatinine stable at 2.15 this am   Length of Stay: 1  FINCH, LINDSAY N, PA-C  07/20/2020, 7:52 AM  Advanced Heart Failure Team Pager [phone] (M-F; 7a - 5p)  Please contact [facility] for night-coverage after hours (5p -7a ) and weekends on [email or web address]   Patient seen with PA, agree with the above note.   He diuresed well overnight, weight down.  Creatinine stable 2.15. Digoxin level ok at 0.6.   General: NAD Neck: JVP 12 cm, no  thyromegaly or thyroid nodule.  Lungs: Clear to auscultation bilaterally with normal respiratory effort. CV: Nondisplaced PMI.  Heart regular S1/S2, no S3/S4, no murmur.  1+ edema to knees.  Abdomen: Soft, nontender, no hepatosplenomegaly, no distention.  Skin: Intact without lesions or rashes.  Neurologic: Alert and oriented x 3.  Psych: Normal affect. Extremities: No clubbing or cyanosis.  HEENT: Normal.   Still with volume overload.  Continue IV Lasix today, probably will be able to transition to torsemide tomorrow.  Would use higher dose torsemide when transitioned, 60 mg bid.   Marca Ancona 07/20/2020 8:50 AM

## 2020-07-21 DIAGNOSIS — I5023 Acute on chronic systolic (congestive) heart failure: Secondary | ICD-10-CM | POA: Diagnosis not present

## 2020-07-21 DIAGNOSIS — R748 Abnormal levels of other serum enzymes: Secondary | ICD-10-CM | POA: Diagnosis not present

## 2020-07-21 DIAGNOSIS — I5043 Acute on chronic combined systolic (congestive) and diastolic (congestive) heart failure: Secondary | ICD-10-CM | POA: Diagnosis not present

## 2020-07-21 DIAGNOSIS — Z8679 Personal history of other diseases of the circulatory system: Secondary | ICD-10-CM | POA: Diagnosis not present

## 2020-07-21 DIAGNOSIS — I4819 Other persistent atrial fibrillation: Secondary | ICD-10-CM | POA: Diagnosis not present

## 2020-07-21 DIAGNOSIS — I472 Ventricular tachycardia: Secondary | ICD-10-CM | POA: Diagnosis not present

## 2020-07-21 DIAGNOSIS — N1832 Chronic kidney disease, stage 3b: Secondary | ICD-10-CM | POA: Diagnosis not present

## 2020-07-21 DIAGNOSIS — I484 Atypical atrial flutter: Secondary | ICD-10-CM | POA: Diagnosis not present

## 2020-07-21 LAB — CBC
HCT: 47.4 % (ref 39.0–52.0)
Hemoglobin: 15.6 g/dL (ref 13.0–17.0)
MCH: 30.6 pg (ref 26.0–34.0)
MCHC: 32.9 g/dL (ref 30.0–36.0)
MCV: 93.1 fL (ref 80.0–100.0)
Platelets: 206 10*3/uL (ref 150–400)
RBC: 5.09 MIL/uL (ref 4.22–5.81)
RDW: 14.6 % (ref 11.5–15.5)
WBC: 11.7 10*3/uL — ABNORMAL HIGH (ref 4.0–10.5)
nRBC: 0.2 % (ref 0.0–0.2)

## 2020-07-21 LAB — BASIC METABOLIC PANEL
Anion gap: 9 (ref 5–15)
BUN: 21 mg/dL — ABNORMAL HIGH (ref 6–20)
CO2: 23 mmol/L (ref 22–32)
Calcium: 8.6 mg/dL — ABNORMAL LOW (ref 8.9–10.3)
Chloride: 100 mmol/L (ref 98–111)
Creatinine, Ser: 2.18 mg/dL — ABNORMAL HIGH (ref 0.61–1.24)
GFR, Estimated: 35 mL/min — ABNORMAL LOW (ref >60)
Glucose, Bld: 115 mg/dL — ABNORMAL HIGH (ref 70–99)
Potassium: 4 mmol/L (ref 3.5–5.1)
Sodium: 132 mmol/L — ABNORMAL LOW (ref 135–145)

## 2020-07-21 LAB — MAGNESIUM: Magnesium: 2.1 mg/dL (ref 1.7–2.4)

## 2020-07-21 MED ORDER — TORSEMIDE 60 MG PO TABS: 60.0000 mg | ORAL_TABLET | Freq: Two times a day (BID) | ORAL | 2 refills | Status: DC

## 2020-07-21 MED ORDER — TORSEMIDE 20 MG PO TABS: 60.0000 mg | ORAL_TABLET | Freq: Two times a day (BID) | ORAL | Status: DC

## 2020-07-21 NOTE — TOC Initial Note (Signed)
Transition of Care [facility]) - Initial/Assessment Note    Patient Details  Name: Roberto [name] MRN: [id number] Date of Birth: Feb 01, 1965  [facility]) CM/SW Contact:    Leone Haven, RN Phone Number: 07/21/2020, 11:46 AM  Clinical Narrative:                 NCM  spoke with patient he is for dc today, his sister [facility] to [facility] he states he needs letter from [place] for his supervisor at work about driving.  He has a scale at home and a bp cuff. He states he will try to do better with eating a low sodium diet. He has transportation home today.  Expected Discharge Plan: Home/Self Care Barriers to Discharge: No Barriers Identified   Patient Goals and CMS Choice Patient states their goals for this hospitalization and ongoing recovery are:: return home   Choice offered to / list presented to : NA  Expected Discharge Plan and Services Expected Discharge Plan: Home/Self Care   Discharge Planning Services: CM Consult Post Acute Care Choice: NA Living arrangements for the past 2 months: Single Family Home Expected Discharge Date: 07/21/20                 DME Agency: NA       HH Arranged: NA          Prior Living Arrangements/Services Living arrangements for the past 2 months: Single Family Home Lives with:: Self Patient language and need for interpreter reviewed:: Yes Do you feel safe going back to the place where you live?: Yes      Need for Family Participation in Patient Care: No (Comment) Care giver support system in place?: Yes (comment)   Criminal Activity/Legal Involvement Pertinent to Current Situation/Hospitalization: No - Comment as needed  Activities of Daily Living Home Assistive Devices/Equipment: None ADL Screening (condition at time of admission) Patient's cognitive ability adequate to safely complete daily activities?: Yes Is the patient deaf or have difficulty hearing?: No Does the patient have difficulty seeing, even when wearing  glasses/contacts?: Yes Does the patient have difficulty concentrating, remembering, or making decisions?: No Patient able to express need for assistance with ADLs?: Yes Does the patient have difficulty dressing or bathing?: Yes Independently performs ADLs?: Yes (appropriate for developmental age) Does the patient have difficulty walking or climbing stairs?: Yes Weakness of Legs: None Weakness of Arms/Hands: None  Permission Sought/Granted                  Emotional Assessment   Attitude/Demeanor/Rapport: Engaged Affect (typically observed): Appropriate Orientation: : Oriented to  Time, Oriented to Place, Oriented to Self, Oriented to Situation Alcohol / Substance Use: Not Applicable Psych Involvement: No (comment)  Admission diagnosis:  Acute on chronic combined systolic and diastolic congestive heart failure (HCC) [I50.43] Acute on chronic systolic CHF (congestive heart failure) (HCC) [I50.23] Acute on chronic congestive heart failure (HCC) [I50.9] Patient Active Problem List   Diagnosis Date Noted   Acute on chronic congestive heart failure (HCC) 07/20/2020   Chronic kidney disease, stage 3b (HCC) 07/19/2020   Acute kidney injury superimposed on chronic kidney disease (HCC) 06/10/2020   Chronic systolic CHF (congestive heart failure) (HCC)    Ventricular tachyarrhythmia (HCC) 06/01/2020   Acute on chronic combined systolic and diastolic CHF (congestive heart failure) (HCC) 04/13/2020   S/P implantation of automatic cardioverter/defibrillator (AICD) 04/13/2020   Hx of diverticulitis of colon 12/06/2019   History of colon resection 12/06/2019  History of creation of ostomy (HCC) 12/06/2019   History of colonic polyps 12/06/2019   Hypomagnesemia    History of LV (left ventricular) mural thrombus 09/04/2018   History of DVT (deep vein thrombosis) 09/04/2018   PAF (paroxysmal atrial fibrillation) (HCC) 09/04/2018   Gout 05/06/2018   Atypical atrial flutter (HCC) 03/17/2017    Chronic pain of right knee 03/13/2017   Typical atrial flutter s/p catheter ablation    Varicose veins of legs 12/17/2015   Encounter for health maintenance examination in adult 12/17/2015   Special screening for malignant neoplasms, colon 12/17/2015   Vaccine counseling 12/17/2015   Knee deformity, acquired, right 12/17/2015   Gait disturbance 12/17/2015   Osteoarthritis of right knee 12/17/2015   Varicose veins of lower extremities with ulcer (HCC) 09/18/2015   Need for prophylactic vaccination and inoculation against influenza 09/18/2015   Tinea versicolor 06/15/2015   Impaired fasting blood sugar 12/28/2014   Chronic anticoagulation 12/28/2014   Erectile dysfunction 02/19/2012   Chronic systolic heart failure (HCC) 12/29/2011   Hypokalemia 12/24/2011   Lupus anticoagulant positive 12/22/2011   Nonischemic cardiomyopathy (HCC) 12/16/2011   Paroxysmal SVT (supraventricular tachycardia) (HCC) 12/16/2011   History of venous thromboembolism 12/16/2011   Venous insufficiency 11/04/2011   Morbid obesity with BMI of 50.0-59.9, adult (HCC) 06/02/2008   PCP:  [name], MD Pharmacy:   [facility] [phone] [place], [place] - [address] AT [facility] [address] [facility] [address] [place] [address] Phone: [phone] Fax: [phone]     Social Determinants of Health (SDOH) Interventions    Readmission Risk Interventions Readmission Risk Prevention Plan 07/21/2020 04/19/2020 01/04/2019  Transportation Screening Complete Complete Complete  PCP or Specialist Appt within 5-7 Days - Complete -  PCP or Specialist Appt within 3-5 Days - - Not Complete  Not Complete comments - - pt not stable for dc at this time  Home Care Screening - Complete -  Medication Review (RN CM) - Complete -  HRI or Home Care Consult Complete - Complete  Social Work Consult for Recovery Care Planning/Counseling Complete - Complete  Palliative Care Screening Not Applicable  - Not Applicable  Medication Review (RN Care Manager) Complete - Referral to Pharmacy  Some recent data might be hidden

## 2020-07-21 NOTE — TOC Transition Note (Signed)
Transition of Care [facility]) - CM/SW Discharge Note   Patient Details  Name: Roberto [name] MRN: [id number] Date of Birth: 05/31/1965  [facility]) CM/SW Contact:  Leone Haven, RN Phone Number: 07/21/2020, 11:52 AM   Clinical Narrative:    NCM  spoke with patient he is for dc today, his sister takes [facility] to [facility] he states he needs letter from [place] for his supervisor at work about driving.  He has a scale at home and a bp cuff. He states he will try to do better with eating a low sodium diet. He has transportation home today.     Final next level of care: Home/Self Care Barriers to Discharge: No Barriers Identified   Patient Goals and CMS Choice Patient states their goals for this hospitalization and ongoing recovery are:: return home   Choice offered to / list presented to : NA  Discharge Placement                       Discharge Plan and Services   Discharge Planning Services: CM Consult Post Acute Care Choice: NA            DME Agency: NA       HH Arranged: NA          Social Determinants of Health (SDOH) Interventions     Readmission Risk Interventions Readmission Risk Prevention Plan 07/21/2020 04/19/2020 01/04/2019  Transportation Screening Complete Complete Complete  PCP or Specialist Appt within 5-7 Days - Complete -  PCP or Specialist Appt within 3-5 Days - - Not Complete  Not Complete comments - - pt not stable for dc at this time  Home Care Screening - Complete -  Medication Review (RN CM) - Complete -  HRI or Home Care Consult Complete - Complete  Social Work Consult for Recovery Care Planning/Counseling Complete - Complete  Palliative Care Screening Not Applicable - Not Applicable  Medication Review (RN Care Manager) Complete - Referral to Pharmacy  Some recent data might be hidden

## 2020-07-21 NOTE — Discharge Summary (Signed)
Physician Discharge Summary  Roberto FillJoseph T Oakley WUJ:[id number] DOB: October 02, 1965 DOA: 07/19/2020  PCP: Ronnald NianLalonde, [name], MD  Admit date: 07/19/2020 Discharge date: 07/21/2020  Admitted From: Home Disposition: Home  Recommendations for Outpatient Follow-up:  Follow up with PCP in 1-2 weeks Follow-up with cardiology, Dr. Gala RomneyBensimhon 2 weeks Torsemide increased to 60 mg p.o. 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  Symptoms associated with orthopnea and while attempting to sleep.  Patient reports swelling in his abdomen is worse than his baseline with increased lower extremity edema.  Patient also reports nonproductive cough.  Denies chest pain, no dizziness, no fever/chills, no nausea/vomiting/diarrhea.   In the ED, temperature 98.4 F, HR 79, RR 18, BP 121/75, SPO2 94% on room air.  Sodium 134, potassium 3.6, chloride 100, CO2 21, glucose 137, BUN 17, creatinine 2.23, magnesium 2.0.  WBC 9.3, hemoglobin 15.7, platelets 213.  BNP 959.9.  High sensitive troponin 46.  Cova-19 PCR negative.  Influenza A/B PCR negative.  EKG with sinus tachycardia.  Chest x-ray with cardiac enlargement with pulmonary vascular congestion and perihilar edema.  Patient was given furosemide 80 mg IV x1 in the ED.  Cardiology  consulted.  TRH consulted for further evaluation and management of acute on chronic, on systolic and diastolic congestive heart failure.  Hospital course:  Acute on chronic combined systolic and diastolic congestive heart failure Patient presenting to the ED with progressive shortness of breath associated with worsening abdominal/lower extremity edema.  Patient was noted to have elevated BNP with vascular/perihilar congestion and edema on chest x-ray.  Patient is followed by cardiology/heart failure service outpatient; he is nearing end-stage and not a candidate for advanced therapies with patient's size, RV dysfunction, CKD and ileostomy.  Patient was started on IV feed diuresis with furosemide with good urine output.  Patient was transition to higher dose torsemide 60 mg p.o. twice daily on discharge.  Continue Spironolactone 50 mg p.o. daily, Digoxin 0.0625 mcg p.o. daily, Carvedilol 6.25 mg twice daily, Ranolazine 500 mg p.o. twice daily, farxiga 10mg  PO daily.  Outpatient follow-up with heart failure service, Dr. Gala RomneyBensimhon 2 weeks.   CKD stage IIIb Creatinine 2.23 on admission, baseline 2.2-2.6.   History of ventricular tachycardia s/p AICD Patient follows with electrophysiology, Dr. Elberta Fortisamnitz outpatient.  No VT on device interrogation. Continue amiodarone 200 mg p.o. twice daily, Continue mexiletine   Hx atrial flutter status post ablation Continue amiodarone and Apixaban 5 mg p.o. twice daily   History of diverticulitis s/p colon resection with ostomy --Wound care evaluation for continued ostomy care   History of recurrent DVT/PE on anticoagulation Right lower extremity more edematous than left lower extremity.  Reports compliance with anticoagulation.  Vascular duplex ultrasound right lower extremity negative for DVT. Continue Eliquis 5 mg p.o. twice daily   Morbid obesity Discussed with patient needs for aggressive lifestyle changes/weight loss as this complicates all  facets of care.   Outpatient follow-up with PCP.  May benefit from bariatric evaluation outpatient.   Ethics: Patient with recurrent hospitalizations for congestive heart failure exacerbations, nearing end stage per cardiology and not candidate for advanced therapies.  Recommend further outpatient discussions regarding goals of care.  Discharge Diagnoses:  Principal Problem:   Acute on chronic combined systolic and diastolic CHF (congestive heart failure) (HCC) Active Problems:   Chronic anticoagulation   Atypical atrial flutter (HCC)   History of DVT (deep vein thrombosis)   History of creation of ostomy (HCC)   Ventricular tachyarrhythmia (HCC)   Chronic kidney disease, stage 3b (HCC)   Acute on chronic congestive heart failure [facility])    Discharge Instructions  Discharge Instructions     (HEART FAILURE PATIENTS) Call MD:  Anytime you have any of the following symptoms: 1) 3 pound weight gain in 24 hours or 5 pounds in 1 week 2) shortness of breath, with or without a dry hacking cough 3) swelling in the hands, feet or stomach 4) if you have to sleep on extra pillows at night in order to breathe.   Complete by: As directed    Call MD for:  difficulty breathing, headache or visual disturbances   Complete by: As directed    Call MD for:  extreme fatigue   Complete by: As directed    Call MD for:  persistant dizziness or light-headedness   Complete by: As directed    Call MD for:  persistant nausea and vomiting   Complete by: As directed    Call MD for:  severe uncontrolled pain   Complete by: As directed    Call MD for:  temperature >100.4   Complete by: As directed    Diet - low sodium heart healthy   Complete by: As directed    Increase activity slowly   Complete by: As directed       Allergies as of 07/21/2020       Reactions   Shrimp [shellfish Allergy] Hives, Itching        Medication List     TAKE these medications    amiodarone 200 MG tablet Commonly known as: PACERONE Take  200 mg by mouth 2 (two) times daily.   carvedilol 6.25 MG tablet Commonly known as: COREG Take 1 tablet (6.25 mg total) by mouth 2 (two) times daily with a meal.   dapagliflozin propanediol 10 MG Tabs tablet Commonly known as: Farxiga Take 1 tablet (10 mg total) by mouth daily.   digoxin 0.125 MG tablet Commonly known as: LANOXIN Take 0.5 tablets (0.0625 mg total) by mouth daily.   Eliquis 5 MG Tabs tablet Generic drug: apixaban TAKE 1 TABLET(5 MG) BY MOUTH TWICE DAILY What changed: See the new instructions.   mexiletine 150 MG capsule Commonly known as: MEXITIL Take 2 capsules (300 mg total) by mouth every 12 (twelve) hours.   pantoprazole 40 MG tablet Commonly known as: PROTONIX Take 1 tablet (40 mg total) by mouth daily.   potassium chloride SA 20 MEQ tablet Commonly known as: KLOR-CON Take 2 tablets (40 mEq total) by mouth daily.   ranolazine 500 MG 12 hr tablet Commonly known as: RANEXA Take 1 tablet (500 mg total) by mouth 2 (two) times daily.   spironolactone 25 MG tablet Commonly known as: ALDACTONE Take 2 tablets (50 mg total) by mouth daily.   Torsemide 60 MG Tabs Take 60 mg by mouth 2 (two) times daily. What changed:  medication strength how  much to take        Follow-up Information     [name], MD. Schedule an appointment as soon as possible for a visit in 1 week(s).   Specialty: Family Medicine Contact information: [address] [place] [address] [phone]         Bensimhon, Bevelyn Buckles, MD. Schedule an appointment as soon as possible for a visit in 2 week(s).   Specialty: Cardiology Contact information: [address] [place] [address] [phone]                Allergies  Allergen Reactions   Shrimp [Shellfish Allergy] Hives and Itching    Consultations: Cardiology/heart failure service   Procedures/Studies: DG Chest 2 View  Result Date: 07/19/2020 CLINICAL DATA:  Shortness  of breath for a few days. EXAM: CHEST - 2 VIEW COMPARISON:  06/01/2020 FINDINGS: Cardiac pacemaker. Cardiac enlargement with pulmonary vascular congestion. New perihilar infiltrates likely representing edema. No pleural effusions. No pneumothorax. IMPRESSION: Cardiac enlargement with pulmonary vascular congestion and perihilar edema. Electronically Signed   By: Burman Nieves M.D.   On: 07/19/2020 03:31   VAS [place] LOWER EXTREMITY VENOUS (DVT)  Result Date: 07/19/2020  Lower Venous DVT Study Patient Name:  [facility]  Date of Exam:   07/19/2020 Medical Rec #: [id number]       Accession #:    [id number] Date of Birth: 06/22/1965       Patient Gender: M Patient Age:   46Y Exam Location:  [facility] Procedure:      VAS [place] LOWER EXTREMITY VENOUS (DVT) Referring Phys: [id number] Ozell Ferrera J [place] --------------------------------------------------------------------------------  Indications: Edema, and SOB.  Comparison Study: Previous 11/21/2014 - LLE chronic DVT FV and PopV Performing Technologist: Jody Hill RVT, RDMS  Examination Guidelines: A complete evaluation includes B-mode imaging, spectral Doppler, color Doppler, and power Doppler as needed of all accessible portions of each vessel. Bilateral testing is considered an integral part of a complete examination. Limited examinations for reoccurring indications may be performed as noted. The reflux portion of the exam is performed with the patient in reverse Trendelenburg.  +---------+---------------+---------+-----------+----------+-------------------+ RIGHT    CompressibilityPhasicitySpontaneityPropertiesThrombus Aging      +---------+---------------+---------+-----------+----------+-------------------+ CFV      Full           Yes      Yes                                      +---------+---------------+---------+-----------+----------+-------------------+ SFJ      Full                                                              +---------+---------------+---------+-----------+----------+-------------------+ FV Prox  Full           Yes      Yes                                      +---------+---------------+---------+-----------+----------+-------------------+ FV Mid   Full           Yes      Yes                                      +---------+---------------+---------+-----------+----------+-------------------+  FV DistalFull           Yes      Yes                                      +---------+---------------+---------+-----------+----------+-------------------+ PFV      Full                                                             +---------+---------------+---------+-----------+----------+-------------------+ POP      Full           Yes      Yes                                      +---------+---------------+---------+-----------+----------+-------------------+ PTV      Full                                         Not well visualized +---------+---------------+---------+-----------+----------+-------------------+ PERO     Full                                         Not well visualized +---------+---------------+---------+-----------+----------+-------------------+   +----+---------------+---------+-----------+----------+--------------+ LEFTCompressibilityPhasicitySpontaneityPropertiesThrombus Aging +----+---------------+---------+-----------+----------+--------------+ CFV Full           Yes      Yes                                 +----+---------------+---------+-----------+----------+--------------+    Summary: RIGHT: - There is no evidence of deep vein thrombosis in the lower extremity. - There is no evidence of superficial venous thrombosis.  - No cystic structure found in the popliteal fossa.  LEFT: - No evidence of common femoral vein obstruction.  *See table(s) above for measurements and observations. Electronically signed by Sherald Hess MD on 07/19/2020 at  5:53:04 PM.    Final      Subjective: Patient seen examined bedside, resting comfortably.  Sitting in bedside chair.  Seen by cardiology this morning, transition to oral torsemide at increased dose.  Okay for discharge home.  Patient with no other questions or concerns at this time.  Currently denies headache, no dizziness, no chest pain, palpitations, no shortness of breath, no abdominal pain, no weakness, no fatigue, no paresthesias.  No acute events overnight per nursing staff.  Discharge Exam: Vitals:   07/21/20 0520 07/21/20 0743  BP: 111/78 104/73  Pulse: 78 80  Resp: 20 19  Temp: 98.4 F (36.9 C) 98.8 F (37.1 C)  SpO2: 93% 91%   Vitals:   07/20/20 1756 07/20/20 1912 07/21/20 0520 07/21/20 0743  BP: 103/75 106/71 111/78 104/73  Pulse: 78 78 78 80  Resp:  20 20 19   Temp:  (!) 97.5 F (36.4 C) 98.4 F (36.9 C) 98.8 F (37.1 C)  TempSrc:  Oral Oral Oral  SpO2:  92% 93% 91%  Weight:   (!) 151.5 kg   Height:  General: Pt is alert, awake, not in acute distress, morbidly obese Cardiovascular: RRR, S1/S2 +, no rubs, no gallops Respiratory: CTA bilaterally, no wheezing, no rhonchi, on room air Abdominal: Soft, NT, ND, bowel sounds + Extremities: 2+ pitting edema lower extremities with chronic venous changes to skin, no cyanosis    The results of significant diagnostics from this hospitalization (including imaging, microbiology, ancillary and laboratory) are listed below for reference.     Microbiology: Recent Results (from the past 240 hour(s))  Resp Panel by RT-PCR (Flu A&B, Covid) Nasopharyngeal Swab     Status: None   Collection Time: 07/19/20  4:03 AM   Specimen: Nasopharyngeal Swab; Nasopharyngeal(NP) swabs in vial transport medium  Result Value Ref Range Status   SARS Coronavirus 2 by RT PCR NEGATIVE NEGATIVE Final    Comment: (NOTE) SARS-CoV-2 target nucleic acids are NOT DETECTED.  The SARS-CoV-2 RNA is generally detectable in upper  respiratory specimens during the acute phase of infection. The lowest concentration of SARS-CoV-2 viral copies this assay can detect is 138 copies/mL. A negative result does not preclude SARS-Cov-2 infection and should not be used as the sole basis for treatment or other patient management decisions. A negative result may occur with  improper specimen collection/handling, submission of specimen other than nasopharyngeal swab, presence of viral mutation(s) within the areas targeted by this assay, and inadequate number of viral copies(<138 copies/mL). A negative result must be combined with clinical observations, patient history, and epidemiological information. The expected result is Negative.  Fact Sheet for Patients:  BloggerCourse.com  Fact Sheet for Healthcare Providers:  SeriousBroker.it  This test is no t yet approved or cleared by the [place] FDA and  has been authorized for detection and/or diagnosis of SARS-CoV-2 by FDA under an Emergency Use Authorization (EUA). This EUA will remain  in effect (meaning this test can be used) for the duration of the COVID-19 declaration under Section 564(b)(1) of the Act, 21 U.S.C.section 360bbb-3(b)(1), unless the authorization is terminated  or revoked sooner.       Influenza A by PCR NEGATIVE NEGATIVE Final   Influenza B by PCR NEGATIVE NEGATIVE Final    Comment: (NOTE) The Xpert Xpress SARS-CoV-2/FLU/RSV plus assay is intended as an aid in the diagnosis of influenza from Nasopharyngeal swab specimens and should not be used as a sole basis for treatment. Nasal washings and aspirates are unacceptable for Xpert Xpress SARS-CoV-2/FLU/RSV testing.  Fact Sheet for Patients: BloggerCourse.com  Fact Sheet for Healthcare Providers: SeriousBroker.it  This test is not yet approved or cleared by the [place] FDA and has been  authorized for detection and/or diagnosis of SARS-CoV-2 by FDA under an Emergency Use Authorization (EUA). This EUA will remain in effect (meaning this test can be used) for the duration of the COVID-19 declaration under Section 564(b)(1) of the Act, 21 U.S.C. section 360bbb-3(b)(1), unless the authorization is terminated or revoked.  Performed at [facility], [address] [place], [place] [address]      Labs: BNP (last 3 results) Recent Labs    05/24/20 0759 06/01/20 0909 07/19/20 0222  BNP 583.6* 773.7* 959.9*   Basic Metabolic Panel: Recent Labs  Lab 07/19/20 0222 07/19/20 1403 07/20/20 0512 07/20/20 1440 07/21/20 0134  NA 134*  --  135  --  132*  K 3.6 4.1 3.8 4.2 4.0  CL 100  --  98  --  100  CO2 21*  --  25  --  23  GLUCOSE 137*  --  137*  --  115*  BUN 17  --  17  --  21*  CREATININE 2.23*  --  2.15*  --  2.18*  CALCIUM 8.5*  --  8.8*  --  8.6*  MG 2.0  --   --   --  2.1   Liver Function Tests: No results for input(s): AST, ALT, ALKPHOS, BILITOT, PROT, ALBUMIN in the last 168 hours. No results for input(s): LIPASE, AMYLASE in the last 168 hours. No results for input(s): AMMONIA in the last 168 hours. CBC: Recent Labs  Lab 07/19/20 0222 07/21/20 0134  WBC 9.3 11.7*  NEUTROABS 6.5  --   HGB 15.7 15.6  HCT 47.4 47.4  MCV 92.9 93.1  PLT 213 206   Cardiac Enzymes: No results for input(s): CKTOTAL, CKMB, CKMBINDEX, TROPONINI in the last 168 hours. BNP: Invalid input(s): POCBNP CBG: No results for input(s): GLUCAP in the last 168 hours. D-Dimer No results for input(s): DDIMER in the last 72 hours. Hgb A1c No results for input(s): HGBA1C in the last 72 hours. Lipid Profile No results for input(s): CHOL, HDL, LDLCALC, TRIG, CHOLHDL, LDLDIRECT in the last 72 hours. Thyroid function studies No results for input(s): TSH, T4TOTAL, T3FREE, THYROIDAB in the last 72 hours.  Invalid input(s): FREET3 Anemia work up No results for input(s):  VITAMINB12, FOLATE, FERRITIN, TIBC, IRON, RETICCTPCT in the last 72 hours. Urinalysis    Component Value Date/Time   COLORURINE STRAW (A) 06/01/2020 0909   APPEARANCEUR CLEAR 06/01/2020 0909   LABSPEC 1.003 (L) 06/01/2020 0909   LABSPEC 1.025 03/18/2017 1337   PHURINE 5.0 06/01/2020 0909   GLUCOSEU >=500 (A) 06/01/2020 0909   HGBUR SMALL (A) 06/01/2020 0909   BILIRUBINUR NEGATIVE 06/01/2020 0909   BILIRUBINUR negative 03/18/2017 1337   BILIRUBINUR neg 12/17/2015 0944   KETONESUR NEGATIVE 06/01/2020 0909   PROTEINUR 100 (A) 06/01/2020 0909   UROBILINOGEN negative 12/17/2015 0944   UROBILINOGEN 0.2 12/16/2011 1208   NITRITE NEGATIVE 06/01/2020 0909   LEUKOCYTESUR NEGATIVE 06/01/2020 0909   Sepsis Labs Invalid input(s): PROCALCITONIN,  WBC,  LACTICIDVEN Microbiology Recent Results (from the past 240 hour(s))  Resp Panel by RT-PCR (Flu A&B, Covid) Nasopharyngeal Swab     Status: None   Collection Time: 07/19/20  4:03 AM   Specimen: Nasopharyngeal Swab; Nasopharyngeal(NP) swabs in vial transport medium  Result Value Ref Range Status   SARS Coronavirus 2 by RT PCR NEGATIVE NEGATIVE Final    Comment: (NOTE) SARS-CoV-2 target nucleic acids are NOT DETECTED.  The SARS-CoV-2 RNA is generally detectable in upper respiratory specimens during the acute phase of infection. The lowest concentration of SARS-CoV-2 viral copies this assay can detect is 138 copies/mL. A negative result does not preclude SARS-Cov-2 infection and should not be used as the sole basis for treatment or other patient management decisions. A negative result may occur with  improper specimen collection/handling, submission of specimen other than nasopharyngeal swab, presence of viral mutation(s) within the areas targeted by this assay, and inadequate number of viral copies(<138 copies/mL). A negative result must be combined with clinical observations, patient history, and epidemiological information. The expected  result is Negative.  Fact Sheet for Patients:  BloggerCourse.com  Fact Sheet for Healthcare Providers:  SeriousBroker.it  This test is no t yet approved or cleared by the [place] FDA and  has been authorized for detection and/or diagnosis of SARS-CoV-2 by FDA under an Emergency Use Authorization (EUA). This EUA will remain  in effect (meaning this test can be  used) for the duration of the COVID-19 declaration under Section 564(b)(1) of the Act, 21 U.S.C.section 360bbb-3(b)(1), unless the authorization is terminated  or revoked sooner.       Influenza A by PCR NEGATIVE NEGATIVE Final   Influenza B by PCR NEGATIVE NEGATIVE Final    Comment: (NOTE) The Xpert Xpress SARS-CoV-2/FLU/RSV plus assay is intended as an aid in the diagnosis of influenza from Nasopharyngeal swab specimens and should not be used as a sole basis for treatment. Nasal washings and aspirates are unacceptable for Xpert Xpress SARS-CoV-2/FLU/RSV testing.  Fact Sheet for Patients: BloggerCourse.com  Fact Sheet for Healthcare Providers: SeriousBroker.it  This test is not yet approved or cleared by the [place] FDA and has been authorized for detection and/or diagnosis of SARS-CoV-2 by FDA under an Emergency Use Authorization (EUA). This EUA will remain in effect (meaning this test can be used) for the duration of the COVID-19 declaration under Section 564(b)(1) of the Act, 21 U.S.C. section 360bbb-3(b)(1), unless the authorization is terminated or revoked.  Performed at [facility], [address], [place], [place] [address]      Time coordinating discharge: Over 30 minutes  SIGNED:   Alvira Philips [place], DO  [facility] 07/21/2020, 11:24 AM

## 2020-07-21 NOTE — Progress Notes (Signed)
Patient ID: Roberto [name], male   DOB: 1965-12-22, 55 y.o.   MRN: [id number]     Advanced Heart Failure Rounding Note  PCP-Cardiologist: Arvilla Meres, MD   Subjective:    07/07: Admitted with acute on chronic systolic HF. Diuresing with IV lasix.  Breathing better today, ready to go home.   Objective:   Weight Range: (!) 151.5 kg Body mass index is 50.8 kg/m.   Vital Signs:   Temp:  [97.5 F (36.4 C)-98.8 F (37.1 C)] 98.8 F (37.1 C) (07/09 0743) Pulse Rate:  [75-83] 80 (07/09 0743) Resp:  [19-20] 19 (07/09 0743) BP: (97-111)/(70-78) 104/73 (07/09 0743) SpO2:  [91 %-94 %] 91 % (07/09 0743) Weight:  [151.5 kg-151.9 kg] 151.5 kg (07/09 0520) Last BM Date: 07/21/20  Weight change: Filed Weights   07/20/20 0419 07/20/20 1212 07/21/20 0520  Weight: (!) 151.9 kg (!) 151.9 kg (!) 151.5 kg    Intake/Output:   Intake/Output Summary (Last 24 hours) at 07/21/2020 1002 Last data filed at 07/21/2020 0843 Gross per 24 hour  Intake 1081 ml  Output 1970 ml  Net -889 ml      Physical Exam    General: NAD Neck: Thick. No JVD, no thyromegaly or thyroid nodule.  Lungs: Clear to auscultation bilaterally with normal respiratory effort. CV: Nondisplaced PMI.  Heart regular S1/S2, no S3/S4, no murmur.  No peripheral edema.   Abdomen: Soft, nontender, no hepatosplenomegaly, no distention.  Skin: Intact without lesions or rashes.  Neurologic: Alert and oriented x 3.  Psych: Normal affect. Extremities: No clubbing or cyanosis.  HEENT: Normal.    Telemetry   NSR (personally reviewed).    Labs    CBC Recent Labs    07/19/20 0222 07/21/20 0134  WBC 9.3 11.7*  NEUTROABS 6.5  --   HGB 15.7 15.6  HCT 47.4 47.4  MCV 92.9 93.1  PLT 213 206   Basic Metabolic Panel Recent Labs    32/67/12 0222 07/19/20 1403 07/20/20 0512 07/20/20 1440 07/21/20 0134  NA 134*  --  135  --  132*  K 3.6   < > 3.8 4.2 4.0  CL 100  --  98  --  100  CO2 21*  --  25  --  23  GLUCOSE  137*  --  137*  --  115*  BUN 17  --  17  --  21*  CREATININE 2.23*  --  2.15*  --  2.18*  CALCIUM 8.5*  --  8.8*  --  8.6*  MG 2.0  --   --   --  2.1   < > = values in this interval not displayed.   Liver Function Tests No results for input(s): AST, ALT, ALKPHOS, BILITOT, PROT, ALBUMIN in the last 72 hours. No results for input(s): LIPASE, AMYLASE in the last 72 hours. Cardiac Enzymes No results for input(s): CKTOTAL, CKMB, CKMBINDEX, TROPONINI in the last 72 hours.  BNP: BNP (last 3 results) Recent Labs    05/24/20 0759 06/01/20 0909 07/19/20 0222  BNP 583.6* 773.7* 959.9*    ProBNP (last 3 results) No results for input(s): PROBNP in the last 8760 hours.   D-Dimer No results for input(s): DDIMER in the last 72 hours. Hemoglobin A1C No results for input(s): HGBA1C in the last 72 hours. Fasting Lipid Panel No results for input(s): CHOL, HDL, LDLCALC, TRIG, CHOLHDL, LDLDIRECT in the last 72 hours. Thyroid Function Tests No results for input(s): TSH, T4TOTAL, T3FREE, THYROIDAB in the last  72 hours.  Invalid input(s): FREET3  Other results:   Imaging    No results found.   Medications:     Scheduled Medications:  amiodarone  200 mg Oral BID   apixaban  5 mg Oral BID   carvedilol  3.125 mg Oral BID WC   dapagliflozin propanediol  10 mg Oral Daily   digoxin  0.0625 mg Oral Daily   mexiletine  300 mg Oral Q12H   pantoprazole  40 mg Oral Daily   potassium chloride SA  40 mEq Oral Daily   ranolazine  500 mg Oral BID   sodium chloride flush  3 mL Intravenous Q12H   spironolactone  50 mg Oral Daily   torsemide  60 mg Oral BID    Infusions:  sodium chloride      PRN Medications: sodium chloride, acetaminophen, benzonatate, ondansetron (ZOFRAN) IV, sodium chloride flush    Patient Profile   Mr. Trost is a 55 year old male admitted with acute on chronic systolic CHF. 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  Assessment/Plan  1. Acute on Chronic Systolic Heart Failure: - Echo 10/15/16 - LVEF 15%. - TEE 03/2017 LVEF 15-20%. - Echo 07/2017 with LVEF 20-25%. - Echo 7/20 EF 25-30%. - Echo 12/21 EF 25-30%. - Presenting with NYHA class IV symptoms. Given Lasix 80 mg IV bid.  Improved dyspnea, transition to torsemide 60 mg po bid for home.  - Continue Spiro 50 mg daily - Continue digoxin 0.0625 mcg daily. Dig level 0.6. - Can increase Coreg back to home dose 6.25 mg bid.  - Off Entresto due to recent AKI. - Continue farxiga 10 mg daily.  - Nearing end-stage. Not candidate for advanced therapies with size, RV dysfunction, CKD, and ileostomy. - Palliative care consult for GOC discussion d/t recurrent admissions. Previously wished to remain full code.   2. Elevated troponin -Mildly elevated HS troponin. -Likely d/t demand ischemia from #1 -No prior hx of CAD - negative cath in 2013   3. H/o VT - On 12/16/19 in setting of hypokalemia related to metolazone use. - Admitted 4/22 and 5/22 with recurrent VT.  - Followed by Dr. Elberta Fortis as outpatient. - Not a candidate for ablation at [facility] - Now on amio 200 mg BID and mexilitene. - No driving for 6 months (~54/00). - No recurrent VT on ICD interrogation or telemetry - Monitor K closely with diuresis.   4. 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 - No arrhythmias on device check - Continue amiodarone. - On Eliquis 5 bid. No bleeding issues.   5. H/o Acute diverticulitis with colon resection and ileostomy - S/p skin grafting 09/05/19. - No plan for reversal of ileostomy after extensive abdominal hernia.   6. CKD Stage IIIb - Creatinine baseline 1.8-2.1 - Creatinine stable at 2.18 this am  We will sign off.  I think he can go  home today.  Will followup CHF clinic.  Cardiac meds for discharge: torsemide 60 mg bid, spironolactone 50 daily, amiodarone 200 bid, apixaban 5 bid, mexiletine 300 bid, dapagliflozin 10 daily, digoxin 0.0625 daily, Ranexa 500 bid.   Length of Stay: 1  Marca Ancona, MD  07/21/2020, 10:02 AM  Advanced Heart Failure Team Pager [phone] (M-F; 7a - 5p)  Please contact [facility] for night-coverage after hours (5p -7a ) and weekends on [email or web address]

## 2020-07-23 ENCOUNTER — Encounter (HOSPITAL_COMMUNITY): Payer: Self-pay | Admitting: *Deleted

## 2020-07-23 ENCOUNTER — Other Ambulatory Visit: Payer: Self-pay

## 2020-07-23 ENCOUNTER — Emergency Department (HOSPITAL_COMMUNITY)
Admission: EM | Admit: 2020-07-23 | Discharge: 2020-07-23 | Disposition: A | Discharge status: Home / Self Care | Payer: BC Managed Care – PPO | Attending: Emergency Medicine | Admitting: Emergency Medicine

## 2020-07-23 ENCOUNTER — Emergency Department (HOSPITAL_COMMUNITY): Payer: BC Managed Care – PPO

## 2020-07-23 DIAGNOSIS — R0602 Shortness of breath: Secondary | ICD-10-CM | POA: Diagnosis not present

## 2020-07-23 DIAGNOSIS — I2510 Atherosclerotic heart disease of native coronary artery without angina pectoris: Secondary | ICD-10-CM | POA: Diagnosis not present

## 2020-07-23 DIAGNOSIS — Z95810 Presence of automatic (implantable) cardiac defibrillator: Secondary | ICD-10-CM | POA: Diagnosis not present

## 2020-07-23 DIAGNOSIS — I5043 Acute on chronic combined systolic (congestive) and diastolic (congestive) heart failure: Secondary | ICD-10-CM | POA: Insufficient documentation

## 2020-07-23 DIAGNOSIS — N1832 Chronic kidney disease, stage 3b: Secondary | ICD-10-CM | POA: Diagnosis not present

## 2020-07-23 DIAGNOSIS — I480 Paroxysmal atrial fibrillation: Secondary | ICD-10-CM | POA: Insufficient documentation

## 2020-07-23 DIAGNOSIS — Z96651 Presence of right artificial knee joint: Secondary | ICD-10-CM | POA: Diagnosis not present

## 2020-07-23 DIAGNOSIS — Z7901 Long term (current) use of anticoagulants: Secondary | ICD-10-CM | POA: Diagnosis not present

## 2020-07-23 LAB — CBC WITH DIFFERENTIAL/PLATELET
Abs Immature Granulocytes: 0.23 10*3/uL — ABNORMAL HIGH (ref 0.00–0.07)
Basophils Absolute: 0.1 10*3/uL (ref 0.0–0.1)
Basophils Relative: 1 %
Eosinophils Absolute: 0.1 10*3/uL (ref 0.0–0.5)
Eosinophils Relative: 1 %
HCT: 48.7 % (ref 39.0–52.0)
Hemoglobin: 15.4 g/dL (ref 13.0–17.0)
Immature Granulocytes: 2 %
Lymphocytes Relative: 16 %
Lymphs Abs: 1.7 10*3/uL (ref 0.7–4.0)
MCH: 30.2 pg (ref 26.0–34.0)
MCHC: 31.6 g/dL (ref 30.0–36.0)
MCV: 95.5 fL (ref 80.0–100.0)
Monocytes Absolute: 0.9 10*3/uL (ref 0.1–1.0)
Monocytes Relative: 9 %
Neutro Abs: 7.5 10*3/uL (ref 1.7–7.7)
Neutrophils Relative %: 71 %
Platelets: 207 10*3/uL (ref 150–400)
RBC: 5.1 MIL/uL (ref 4.22–5.81)
RDW: 14.8 % (ref 11.5–15.5)
WBC: 10.6 10*3/uL — ABNORMAL HIGH (ref 4.0–10.5)
nRBC: 0.4 % — ABNORMAL HIGH (ref 0.0–0.2)

## 2020-07-23 LAB — BASIC METABOLIC PANEL
Anion gap: 9 (ref 5–15)
BUN: 20 mg/dL (ref 6–20)
CO2: 25 mmol/L (ref 22–32)
Calcium: 8.7 mg/dL — ABNORMAL LOW (ref 8.9–10.3)
Chloride: 101 mmol/L (ref 98–111)
Creatinine, Ser: 2.5 mg/dL — ABNORMAL HIGH (ref 0.61–1.24)
GFR, Estimated: 30 mL/min — ABNORMAL LOW (ref >60)
Glucose, Bld: 139 mg/dL — ABNORMAL HIGH (ref 70–99)
Potassium: 4.3 mmol/L (ref 3.5–5.1)
Sodium: 135 mmol/L (ref 135–145)

## 2020-07-23 LAB — BRAIN NATRIURETIC PEPTIDE: B Natriuretic Peptide: 992 pg/mL — ABNORMAL HIGH (ref 0.0–100.0)

## 2020-07-23 NOTE — ED Provider Notes (Signed)
Emergency Medicine Provider Triage Evaluation Note  Roberto [name] , a 55 y.o. male  was evaluated in triage.  Pt complains of shortness of breath which occurs only when he starts to fall asleep.  Patient was recently mid to the hospital with CHF exacerbation, reports overall improvement however as he starts to doze off and he suddenly gasps for air and feels short of breath in that moment.  Denies increase in weight..  Review of Systems  Positive: Shortness of breath Negative: Chest pain, lower extremity edema  Physical Exam  BP 104/84 (BP Location: Right Arm)   Pulse 78   Temp 98.5 F (36.9 C)   Resp 18   SpO2 92%  Gen:   Awake, no distress   Resp:  Normal effort  MSK:   Moves extremities without difficulty  Other:  Lungs CTA limited by habitus  Medical Decision Making  Medically screening exam initiated at 11:03 AM.  Appropriate orders placed.  Roberto [name] was informed that the remainder of the evaluation will be completed by another provider, this initial triage assessment does not replace that evaluation, and the importance of remaining in the ED until their evaluation is complete.     Jeannie Fend, PA-C 07/23/20 1106    Gerhard Munch, MD 07/24/20 [phone]

## 2020-07-23 NOTE — Discharge Instructions (Addendum)
Follow up with PCP to discuss sleep study. Return to ER for new or worsening symptoms.

## 2020-07-23 NOTE — ED Triage Notes (Signed)
Pt reports recent admission on 7/7 for heart failure and had fluid removed. Pt was dc home on Saturday and was feeling ok. States he has sob when he is falling asleep. No acute resp distress is noted at triage. Spo 92% on room air.

## 2020-07-23 NOTE — ED Provider Notes (Signed)
[facility] EMERGENCY DEPARTMENT Provider Note   CSN: [id number] Arrival date & time: 07/23/20  1051     History Chief Complaint  Patient presents with   Shortness of Breath    Roberto [name] is a 55 y.o. male.  Pt complains of shortness of breath which occurs only when Roberto [name] starts to fall asleep.  Patient was recently mid to the hospital with CHF exacerbation, reports overall improvement however as Roberto [name] starts to doze off and Roberto [name] suddenly gasps for air and feels short of breath in that moment.  Denies increase in weight..      Past Medical History:  Diagnosis Date   AICD (automatic cardioverter/defibrillator) present 08/2018   Medtronic ICD   Arthritis    knee   Chronic combined systolic and diastolic CHF (congestive heart failure) (HCC)    Chronic venous insufficiency    Coronary artery disease    Deep vein thrombosis (HCC) 2007   left leg   Diverticulitis    c/b perforated colon and nonhealing abdominal wound s/p skin grafting, ileostomy   Hepatitis    patient denies this dx   Lupus anticoagulant positive    Morbid obesity (HCC)    Morbid obesity with BMI of 50.0-59.9, adult (HCC)    Mural thrombus of heart    coumadin   Nonischemic cardiomyopathy (HCC)    a) 12/17/11 echo: LVEF 25-30%, grade 3 diastolic dysfunction (c/w restriction), mod MR, mod LA/LV and mild RA dilatation; b) 12/18/11 cMRI: LVEF 38%, mod LV/mild RV dilatation, global HK, mild-mod RV sys dysfxn, no LV thrombus & patchy non-subendocardial delayed enhancement c/w infil dz or prior myocarditis; c. 07/2018 EF 25-30%.   Paroxysmal SVT (supraventricular tachycardia) (HCC)    Pneumonia    x 2   Pulmonary embolism (HCC)    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   Ventricular tachyarrhythmia [facility])    Prior VT/VF   Wears glasses    Wound of abdomen     Patient Active Problem List   Diagnosis Date Noted   Acute on chronic congestive heart failure (HCC) 07/20/2020   Chronic kidney disease,  stage 3b (HCC) 07/19/2020   Acute kidney injury superimposed on chronic kidney disease (HCC) 06/10/2020   Chronic systolic CHF (congestive heart failure) (HCC)    Ventricular tachyarrhythmia (HCC) 06/01/2020   Acute on chronic combined systolic and diastolic CHF (congestive heart failure) (HCC) 04/13/2020   S/P implantation of automatic cardioverter/defibrillator (AICD) 04/13/2020   Hx of diverticulitis of colon 12/06/2019   History of colon resection 12/06/2019   History of creation of ostomy (HCC) 12/06/2019   History of colonic polyps 12/06/2019   Hypomagnesemia    History of LV (left ventricular) mural thrombus 09/04/2018   History of DVT (deep vein thrombosis) 09/04/2018   PAF (paroxysmal atrial fibrillation) (HCC) 09/04/2018   Gout 05/06/2018   Atypical atrial flutter (HCC) 03/17/2017   Chronic pain of right knee 03/13/2017   Typical atrial flutter s/p catheter ablation    Varicose veins of legs 12/17/2015   Encounter for health maintenance examination in adult 12/17/2015   Special screening for malignant neoplasms, colon 12/17/2015   Vaccine counseling 12/17/2015   Knee deformity, acquired, right 12/17/2015   Gait disturbance 12/17/2015   Osteoarthritis of right knee 12/17/2015   Varicose veins of lower extremities with ulcer (HCC) 09/18/2015   Need for prophylactic vaccination and inoculation against influenza 09/18/2015   Tinea versicolor 06/15/2015   Impaired fasting blood sugar 12/28/2014  Chronic anticoagulation 12/28/2014   Erectile dysfunction 02/19/2012   Chronic systolic heart failure (HCC) 12/29/2011   Hypokalemia 12/24/2011   Lupus anticoagulant positive 12/22/2011   Nonischemic cardiomyopathy (HCC) 12/16/2011   Paroxysmal SVT (supraventricular tachycardia) (HCC) 12/16/2011   History of venous thromboembolism 12/16/2011   Venous insufficiency 11/04/2011   Morbid obesity with BMI of 50.0-59.9, adult (HCC) 06/02/2008    Past Surgical History:  Procedure  Laterality Date   A-FLUTTER ABLATION N/A 11/17/2016   Procedure: A-FLUTTER ABLATION;  Surgeon: Regan Lemming, MD;  Location: [facility] INVASIVE CV LAB;  Service: Cardiovascular;  Laterality: N/A;   APPLICATION OF WOUND VAC N/A 09/05/2019   Procedure: APPLICATION OF WOUND VAC;  Surgeon: Allena Napoleon, MD;  Location: [facility] OR;  Service: Plastics;  Laterality: N/A;  PATIENT'S HOME WOUND VAC APPLIED.   BIOPSY  12/23/2018   Procedure: BIOPSY;  Surgeon: Meridee Score Netty Starring., MD;  Location: [facility] ENDOSCOPY;  Service: Gastroenterology;;   BIOPSY  01/23/2020   Procedure: BIOPSY;  Surgeon: Lemar Lofty., MD;  Location: [facility] ENDOSCOPY;  Service: Gastroenterology;;   CARDIAC CATHETERIZATION  06/2006   Angiographically normal cors   CARDIAC CATHETERIZATION  12/22/2011   R/LHC: normal cors, well-compensated HDs, LV dysfxn   CARDIOVERSION N/A 10/17/2016   Procedure: CARDIOVERSION;  Surgeon: Dolores Patty, MD;  Location: [facility] ENDOSCOPY;  Service: Cardiovascular;  Laterality: N/A;   CARDIOVERSION N/A 03/20/2017   Procedure: CARDIOVERSION;  Surgeon: Dolores Patty, MD;  Location: [facility] ENDOSCOPY;  Service: Cardiovascular;  Laterality: N/A;   COLECTOMY N/A 12/28/2018   Procedure: TOTAL COLECTOMY;  Surgeon: Emelia Loron, MD;  Location: [facility] OR;  Service: General;  Laterality: N/A;   FLEXIBLE SIGMOIDOSCOPY N/A 12/23/2018   Procedure: FLEXIBLE SIGMOIDOSCOPY;  Surgeon: Lemar Lofty., MD;  Location: [facility] ENDOSCOPY;  Service: Gastroenterology;  Laterality: N/A;   FLEXIBLE SIGMOIDOSCOPY N/A 01/23/2020   Procedure: FLEXIBLE SIGMOIDOSCOPY;  Surgeon: Meridee Score Netty Starring., MD;  Location: [facility] ENDOSCOPY;  Service: Gastroenterology;  Laterality: N/A;   ICD IMPLANT N/A 09/03/2018   Procedure: ICD Medtronic IMPLANT;  Surgeon: Regan Lemming, MD;  Location: [facility] INVASIVE CV LAB;  Service: Cardiovascular;  Laterality: N/A;   ILEOSTOMY N/A 12/28/2018   Procedure: ILEOSTOMY;  Surgeon: Emelia Loron,  MD;  Location: [facility] OR;  Service: General;  Laterality: N/A;   IR RADIOLOGIST EVAL & MGMT  02/08/2019   JOINT REPLACEMENT     right knee   LEFT AND RIGHT HEART CATHETERIZATION WITH CORONARY ANGIOGRAM N/A 12/22/2011   Procedure: LEFT AND RIGHT HEART CATHETERIZATION WITH CORONARY ANGIOGRAM;  Surgeon: Dolores Patty, MD;  Location: [facility] CATH LAB;  Service: Cardiovascular;  Laterality: N/A;   PATELLAR TENDON REPAIR     Left   RIGHT HEART CATH N/A 10/20/2016   Procedure: RIGHT HEART CATH;  Surgeon: Dolores Patty, MD;  Location: [facility] INVASIVE CV LAB;  Service: Cardiovascular;  Laterality: N/A;   RIGHT HEART CATH N/A 04/17/2020   Procedure: RIGHT HEART CATH;  Surgeon: Dolores Patty, MD;  Location: [facility] INVASIVE CV LAB;  Service: Cardiovascular;  Laterality: N/A;   SKIN SPLIT GRAFT N/A 09/05/2019   Procedure: SKIN GRAFT SPLIT THICKNESS;  Surgeon: Allena Napoleon, MD;  Location: [facility] OR;  Service: Plastics;  Laterality: N/A;   TEE WITHOUT CARDIOVERSION N/A 03/20/2017   Procedure: TRANSESOPHAGEAL ECHOCARDIOGRAM (TEE);  Surgeon: Dolores Patty, MD;  Location: [facility] ENDOSCOPY;  Service: Cardiovascular;  Laterality: N/A;       Family History  Problem Relation Age of Onset  Hypertension Mother    Clotting disorder Mother        mom with PEs   Cancer Mother        uterine cancer   Diabetes Father    Heart attack Father    Hypertension Other        Sibling   Diabetes Other    Obesity Other    Diabetes Sister    Obesity Sister    Obesity Sister    Colon cancer Neg Hx    Stomach cancer Neg Hx    Esophageal cancer Neg Hx    Rectal cancer Neg Hx    Inflammatory bowel disease Neg Hx    Liver disease Neg Hx    Pancreatic cancer Neg Hx     Social History   Tobacco Use   Smoking status: Never   Smokeless tobacco: Never  Vaping Use   Vaping Use: Never used  Substance Use Topics   Alcohol use: Not Currently    Comment: Rarely   Drug use: No    Home Medications Prior to Admission  medications   Medication Sig Start Date End Date Taking? Authorizing Provider  amiodarone (PACERONE) 200 MG tablet Take 200 mg by mouth 2 (two) times daily.    [provider]  carvedilol (COREG) 6.25 MG tablet Take 1 tablet (6.25 mg total) by mouth 2 (two) times daily with a meal. 06/10/20   Duke Salvia, MD  dapagliflozin propanediol (FARXIGA) 10 MG TABS tablet Take 1 tablet (10 mg total) by mouth daily. 05/29/20   Laurey Morale, MD  digoxin (LANOXIN) 0.125 MG tablet Take 0.5 tablets (0.0625 mg total) by mouth daily. 06/22/20   Bensimhon, Bevelyn Buckles, MD  ELIQUIS 5 MG TABS tablet TAKE 1 TABLET(5 MG) BY MOUTH TWICE DAILY 04/27/20   Robbie Lis M, PA-C  mexiletine (MEXITIL) 150 MG capsule Take 2 capsules (300 mg total) by mouth every 12 (twelve) hours. 06/10/20   Duke Salvia, MD  pantoprazole (PROTONIX) 40 MG tablet Take 1 tablet (40 mg total) by mouth daily. 06/22/20   Bensimhon, Bevelyn Buckles, MD  potassium chloride SA (KLOR-CON) 20 MEQ tablet Take 2 tablets (40 mEq total) by mouth daily. 07/11/20   Bensimhon, Bevelyn Buckles, MD  ranolazine (RANEXA) 500 MG 12 hr tablet Take 1 tablet (500 mg total) by mouth 2 (two) times daily. 06/10/20   Duke Salvia, MD  spironolactone (ALDACTONE) 25 MG tablet Take 2 tablets (50 mg total) by mouth daily. 06/10/20   Duke Salvia, MD  torsemide 60 MG TABS Take 60 mg by mouth 2 (two) times daily. 07/21/20 10/19/20  [place], Eric J, DO    Allergies    Shrimp [shellfish allergy]  Review of Systems   Review of Systems  Constitutional:  Negative for fever.  Respiratory:  Positive for shortness of breath. Negative for cough.   Cardiovascular:  Negative for chest pain.  Gastrointestinal:  Negative for abdominal pain.  Genitourinary:  Negative for decreased urine volume and difficulty urinating.  Musculoskeletal:  Negative for arthralgias and myalgias.  Skin:  Negative for rash and wound.  Allergic/Immunologic: Negative for immunocompromised state.   Neurological:  Negative for weakness.  Psychiatric/Behavioral:  Negative for confusion.   All other systems reviewed and are negative.  Physical Exam Updated Vital Signs BP 110/81   Pulse 78   Temp 98.5 F (36.9 C)   Resp (!) 22   SpO2 93%   Physical Exam Vitals and nursing note reviewed.  Constitutional:  General: Roberto [name] is not in acute distress.    Appearance: Roberto [name] is well-developed. Roberto [name] is not diaphoretic.  HENT:     Head: Normocephalic and atraumatic.  Cardiovascular:     Rate and Rhythm: Normal rate and regular rhythm.  Pulmonary:     Effort: Pulmonary effort is normal.     Breath sounds: Normal breath sounds. No decreased breath sounds.  Chest:     Chest wall: No tenderness.  Abdominal:     Palpations: Abdomen is soft.     Tenderness: There is no abdominal tenderness.  Musculoskeletal:     Right lower leg: No edema.     Left lower leg: No edema.  Skin:    General: Skin is warm and dry.     Findings: No erythema or rash.  Neurological:     Mental Status: Roberto [name] is alert and oriented to person, place, and time.  Psychiatric:        Behavior: Behavior normal.    ED Results / Procedures / Treatments   Labs (all labs ordered are listed, but only abnormal results are displayed) Labs Reviewed  BASIC METABOLIC PANEL - Abnormal; Notable for the following components:      Result Value   Glucose, Bld 139 (*)    Creatinine, Ser 2.50 (*)    Calcium 8.7 (*)    GFR, Estimated 30 (*)    All other components within normal limits  CBC WITH DIFFERENTIAL/PLATELET - Abnormal; Notable for the following components:   WBC 10.6 (*)    nRBC 0.4 (*)    Abs Immature Granulocytes 0.23 (*)    All other components within normal limits  BRAIN NATRIURETIC PEPTIDE - Abnormal; Notable for the following components:   B Natriuretic Peptide 992.0 (*)    All other components within normal limits    EKG None  Radiology DG Chest 2 View  Result Date: 07/23/2020 CLINICAL DATA:  Shortness  of breath. EXAM: CHEST - 2 VIEW COMPARISON:  Single view of the chest 07/19/2020. FINDINGS: There is cardiomegaly and extensive bilateral airspace disease. No pneumothorax or pleural fluid. AICD is in place. No acute or focal bony abnormality. IMPRESSION: Cardiomegaly and extensive bilateral airspace disease is likely represents pulmonary edema. Electronically Signed   By: Drusilla Kanner M.D.   On: 07/23/2020 12:35    Procedures Procedures   Medications Ordered in ED Medications - No data to display  ED Course  I have reviewed the triage vital signs and the nursing notes.  Pertinent labs & imaging results that were available during my care of the patient were reviewed by me and considered in my medical decision making (see chart for details).  Clinical Course as of 07/23/20 1338  Mon Jul 23, 2020  1331 B Natriuretic Peptide(!): 992.0 [CS]  2234 55 year old male with complaint of feeling short of breath when Roberto [name] falls asleep, no complaints otherwise. No lower extremity edema, no SHOB while awake or with exertion, no CP.  Labs unchanged from recent admission. CXR reviewed.  Discussed with Dr. Jeraldine Loots, ER attending, plan is for dc to recheck with PCP, discuss sleep study as patient states Roberto [name] has never been tested for sleep apnea. Return as needed. [LM]    Clinical Course User Index [CS] Dewayne Hatch M, [place] [LM] Alden Hipp   MDM Rules/Calculators/A&P                           Final Clinical Impression(s) /  ED Diagnoses Final diagnoses:  Shortness of breath    Rx / DC Orders ED Discharge Orders     None        Alden HippMurphy, Jayleena Stille A, PA-C 07/23/20 1338    Gerhard MunchLockwood, Robert, MD 07/24/20 1652

## 2020-07-25 ENCOUNTER — Other Ambulatory Visit (HOSPITAL_COMMUNITY): Payer: Self-pay | Admitting: *Deleted

## 2020-07-25 MED ORDER — TORSEMIDE 20 MG PO TABS: 60.0000 mg | ORAL_TABLET | Freq: Two times a day (BID) | ORAL | 3 refills | Status: DC

## 2020-07-26 ENCOUNTER — Telehealth (HOSPITAL_COMMUNITY): Payer: Self-pay | Admitting: *Deleted

## 2020-07-26 NOTE — Telephone Encounter (Signed)
Pt called stating he was in the hospital and he was supposed to be prescribed a medication for his cough. Pt said no medication was sent in and his d/c summary did not mention cough medication. Pt ask that I follow up with Dr.Bensimhon because his cough is associated with his CHF. I told pt I could see where his diuretic  was increased but no new medications prescribed but I would route to Dr.Bensimhon for advice. Pt has been taking increased dose of torsemide since discharge.

## 2020-07-26 NOTE — Telephone Encounter (Addendum)
error 

## 2020-07-27 ENCOUNTER — Other Ambulatory Visit (HOSPITAL_COMMUNITY): Payer: Self-pay | Admitting: *Deleted

## 2020-07-27 ENCOUNTER — Ambulatory Visit (INDEPENDENT_AMBULATORY_CARE_PROVIDER_SITE_OTHER): Payer: BC Managed Care – PPO

## 2020-07-27 DIAGNOSIS — I428 Other cardiomyopathies: Secondary | ICD-10-CM

## 2020-07-27 MED ORDER — BENZONATATE 100 MG PO CAPS: 100.0000 mg | ORAL_CAPSULE | Freq: Three times a day (TID) | ORAL | 0 refills | Status: AC | PRN

## 2020-07-27 NOTE — Telephone Encounter (Signed)
Left detailed vm and medication sent to pharm.

## 2020-07-30 ENCOUNTER — Inpatient Hospital Stay: Payer: BC Managed Care – PPO | Admitting: Family Medicine

## 2020-07-30 ENCOUNTER — Ambulatory Visit (INDEPENDENT_AMBULATORY_CARE_PROVIDER_SITE_OTHER): Payer: BC Managed Care – PPO

## 2020-07-30 DIAGNOSIS — I5022 Chronic systolic (congestive) heart failure: Secondary | ICD-10-CM | POA: Diagnosis not present

## 2020-07-30 DIAGNOSIS — Z95810 Presence of automatic (implantable) cardiac defibrillator: Secondary | ICD-10-CM

## 2020-07-30 LAB — CUP PACEART REMOTE DEVICE CHECK
Battery Remaining Longevity: 122 mo
Battery Voltage: 3.03 V
Brady Statistic RV Percent Paced: 0.01 %
Date Time Interrogation Session: 20220715043723
HighPow Impedance: 87 Ohm
Implantable Lead Implant Date: 20200821
Implantable Lead Location: 753860
Implantable Lead Model: 6935
Implantable Pulse Generator Implant Date: 20200821
Lead Channel Impedance Value: 304 Ohm
Lead Channel Impedance Value: 399 Ohm
Lead Channel Pacing Threshold Amplitude: 0.75 V
Lead Channel Pacing Threshold Pulse Width: 0.4 ms
Lead Channel Sensing Intrinsic Amplitude: 7.125 mV
Lead Channel Sensing Intrinsic Amplitude: 7.125 mV
Lead Channel Setting Pacing Amplitude: 2 V
Lead Channel Setting Pacing Pulse Width: 0.4 ms
Lead Channel Setting Sensing Sensitivity: 0.3 mV

## 2020-07-31 NOTE — Progress Notes (Addendum)
EPIC Encounter for ICM Monitoring  Patient Name: Roberto [name] is a 55 y.o. male Date: 07/31/2020 Primary Care Physican: [name], MD Primary Cardiologist: Bensimhon Electrophysiologist: Elberta Fortis 07/31/2020 Weight: 332-334 lbs   Time in AF     0.0 hr/day (0.0%)                                                          Spoke with patient.  He is feeling somewhat better since hospital discharge, He contacted HF clinic regarding persistent cough which is audible over the phone.  Hospitalized 7/7-7/9 and ED visit 7/11   Optivol thoracic impedance suggesting possible fluid accumulation starting 7/16.  Impedance also suggesting possible fluid accumulation from  6/30-7/6 and 7/8-7/15 which correlate with hospitalization.   Prescribed: Torsemide 20 mg Take 3 tablets (60 mg total) by mouth 2 (two) times daily. Potassium 20 mEq take 2 tablets (40 mEq total) daily.   Spironolactone 25 mg take 2 tablets (50 mg total) daily Farxiga 10 mg take 1 tablet daily   Labs: 07/23/2020 Creatinine 2.50, BUN 20, Potassium 4.3, Sodium 135, GFR 30 07/21/2020 Creatinine 2.18, BUN 21, Potassium 4.0, Sodium 132, GFR 35  07/20/2020 Creatinine 2.15, BUN 17, Potassium 3.8, Sodium 135, GFR 36  07/19/2020 Creatinine 2.23, BUN 17, Potassium 3.6, Sodium 134, GFR 34  06/22/2020 Creatinine 2.21, BUN 3.9, Potassium 134, Sodium 134, GFR 35  06/10/2020 Creatinine 2.72, BUN 63, Potassium 4.2, Sodium 134, GFR 27  A complete set of results can be found in Results Review.   Recommendations:  Patient will receive recommendations if needed at 7/22 OV with HF clinic.   Follow-up plan: ICM clinic phone appointment on 08/14/2020 to recheck fluid levels.   91 day device clinic remote transmission 07/27/2020.     EP/Cardiology Office Visits:   08/17/20 with HF Clinic NP/PA.     Copy of ICM check sent to Dr. Elberta Fortis.   3 month ICM trend: 07/30/2020.    1 Year ICM trend:       Karie Soda, RN 07/31/2020 3:32 PM

## 2020-08-01 ENCOUNTER — Encounter (HOSPITAL_COMMUNITY): Payer: BC Managed Care – PPO | Admitting: Internal Medicine

## 2020-08-03 ENCOUNTER — Emergency Department (HOSPITAL_COMMUNITY): Payer: BC Managed Care – PPO

## 2020-08-03 ENCOUNTER — Encounter (HOSPITAL_COMMUNITY): Payer: Self-pay

## 2020-08-03 ENCOUNTER — Inpatient Hospital Stay (HOSPITAL_COMMUNITY)
Admission: EM | Admit: 2020-08-03 | Discharge: 2020-09-13 | DRG: 871 | Disposition: E | Payer: BC Managed Care – PPO | Attending: Internal Medicine | Admitting: Internal Medicine

## 2020-08-03 ENCOUNTER — Ambulatory Visit (HOSPITAL_BASED_OUTPATIENT_CLINIC_OR_DEPARTMENT_OTHER)
Admission: RE | Admit: 2020-08-03 | Discharge: 2020-08-03 | Disposition: A | Discharge status: Home / Self Care | Payer: BC Managed Care – PPO | Source: Ambulatory Visit | Attending: Family Medicine | Admitting: Family Medicine

## 2020-08-03 ENCOUNTER — Other Ambulatory Visit: Payer: Self-pay

## 2020-08-03 ENCOUNTER — Encounter (HOSPITAL_COMMUNITY): Payer: Self-pay | Admitting: Emergency Medicine

## 2020-08-03 VITALS — BP 98/59 | HR 80 | Wt 339.6 lb

## 2020-08-03 DIAGNOSIS — N1832 Chronic kidney disease, stage 3b: Secondary | ICD-10-CM | POA: Insufficient documentation

## 2020-08-03 DIAGNOSIS — E876 Hypokalemia: Secondary | ICD-10-CM | POA: Diagnosis not present

## 2020-08-03 DIAGNOSIS — I472 Ventricular tachycardia, unspecified: Secondary | ICD-10-CM

## 2020-08-03 DIAGNOSIS — Z66 Do not resuscitate: Secondary | ICD-10-CM | POA: Diagnosis not present

## 2020-08-03 DIAGNOSIS — I081 Rheumatic disorders of both mitral and tricuspid valves: Secondary | ICD-10-CM | POA: Diagnosis present

## 2020-08-03 DIAGNOSIS — Z95810 Presence of automatic (implantable) cardiac defibrillator: Secondary | ICD-10-CM

## 2020-08-03 DIAGNOSIS — Z4659 Encounter for fitting and adjustment of other gastrointestinal appliance and device: Secondary | ICD-10-CM

## 2020-08-03 DIAGNOSIS — I4819 Other persistent atrial fibrillation: Secondary | ICD-10-CM | POA: Insufficient documentation

## 2020-08-03 DIAGNOSIS — Z515 Encounter for palliative care: Secondary | ICD-10-CM

## 2020-08-03 DIAGNOSIS — I5043 Acute on chronic combined systolic (congestive) and diastolic (congestive) heart failure: Secondary | ICD-10-CM | POA: Diagnosis present

## 2020-08-03 DIAGNOSIS — Z96651 Presence of right artificial knee joint: Secondary | ICD-10-CM | POA: Diagnosis present

## 2020-08-03 DIAGNOSIS — I5023 Acute on chronic systolic (congestive) heart failure: Secondary | ICD-10-CM | POA: Insufficient documentation

## 2020-08-03 DIAGNOSIS — Z978 Presence of other specified devices: Secondary | ICD-10-CM

## 2020-08-03 DIAGNOSIS — Z4502 Encounter for adjustment and management of automatic implantable cardiac defibrillator: Secondary | ICD-10-CM | POA: Insufficient documentation

## 2020-08-03 DIAGNOSIS — I440 Atrioventricular block, first degree: Secondary | ICD-10-CM | POA: Diagnosis present

## 2020-08-03 DIAGNOSIS — K567 Ileus, unspecified: Secondary | ICD-10-CM | POA: Diagnosis not present

## 2020-08-03 DIAGNOSIS — Z79899 Other long term (current) drug therapy: Secondary | ICD-10-CM | POA: Insufficient documentation

## 2020-08-03 DIAGNOSIS — K5792 Diverticulitis of intestine, part unspecified, without perforation or abscess without bleeding: Secondary | ICD-10-CM | POA: Insufficient documentation

## 2020-08-03 DIAGNOSIS — R059 Cough, unspecified: Secondary | ICD-10-CM | POA: Insufficient documentation

## 2020-08-03 DIAGNOSIS — I428 Other cardiomyopathies: Secondary | ICD-10-CM | POA: Diagnosis present

## 2020-08-03 DIAGNOSIS — J690 Pneumonitis due to inhalation of food and vomit: Secondary | ICD-10-CM | POA: Diagnosis not present

## 2020-08-03 DIAGNOSIS — I480 Paroxysmal atrial fibrillation: Secondary | ICD-10-CM

## 2020-08-03 DIAGNOSIS — Z7984 Long term (current) use of oral hypoglycemic drugs: Secondary | ICD-10-CM | POA: Insufficient documentation

## 2020-08-03 DIAGNOSIS — Z7901 Long term (current) use of anticoagulants: Secondary | ICD-10-CM

## 2020-08-03 DIAGNOSIS — Z9049 Acquired absence of other specified parts of digestive tract: Secondary | ICD-10-CM

## 2020-08-03 DIAGNOSIS — L03115 Cellulitis of right lower limb: Secondary | ICD-10-CM

## 2020-08-03 DIAGNOSIS — R6521 Severe sepsis with septic shock: Secondary | ICD-10-CM | POA: Diagnosis present

## 2020-08-03 DIAGNOSIS — Z0189 Encounter for other specified special examinations: Secondary | ICD-10-CM

## 2020-08-03 DIAGNOSIS — I130 Hypertensive heart and chronic kidney disease with heart failure and stage 1 through stage 4 chronic kidney disease, or unspecified chronic kidney disease: Secondary | ICD-10-CM | POA: Diagnosis present

## 2020-08-03 DIAGNOSIS — R0602 Shortness of breath: Secondary | ICD-10-CM | POA: Insufficient documentation

## 2020-08-03 DIAGNOSIS — E872 Acidosis: Secondary | ICD-10-CM | POA: Diagnosis not present

## 2020-08-03 DIAGNOSIS — N170 Acute kidney failure with tubular necrosis: Secondary | ICD-10-CM | POA: Diagnosis present

## 2020-08-03 DIAGNOSIS — I484 Atypical atrial flutter: Secondary | ICD-10-CM | POA: Diagnosis present

## 2020-08-03 DIAGNOSIS — E878 Other disorders of electrolyte and fluid balance, not elsewhere classified: Secondary | ICD-10-CM | POA: Diagnosis not present

## 2020-08-03 DIAGNOSIS — I2510 Atherosclerotic heart disease of native coronary artery without angina pectoris: Secondary | ICD-10-CM | POA: Diagnosis present

## 2020-08-03 DIAGNOSIS — N179 Acute kidney failure, unspecified: Secondary | ICD-10-CM

## 2020-08-03 DIAGNOSIS — J9601 Acute respiratory failure with hypoxia: Secondary | ICD-10-CM | POA: Diagnosis not present

## 2020-08-03 DIAGNOSIS — Z9289 Personal history of other medical treatment: Secondary | ICD-10-CM

## 2020-08-03 DIAGNOSIS — A419 Sepsis, unspecified organism: Secondary | ICD-10-CM | POA: Diagnosis not present

## 2020-08-03 DIAGNOSIS — I509 Heart failure, unspecified: Secondary | ICD-10-CM | POA: Diagnosis not present

## 2020-08-03 DIAGNOSIS — J9690 Respiratory failure, unspecified, unspecified whether with hypoxia or hypercapnia: Secondary | ICD-10-CM

## 2020-08-03 DIAGNOSIS — I5084 End stage heart failure: Secondary | ICD-10-CM | POA: Diagnosis present

## 2020-08-03 DIAGNOSIS — Z86718 Personal history of other venous thrombosis and embolism: Secondary | ICD-10-CM

## 2020-08-03 DIAGNOSIS — I5021 Acute systolic (congestive) heart failure: Secondary | ICD-10-CM

## 2020-08-03 DIAGNOSIS — D6862 Lupus anticoagulant syndrome: Secondary | ICD-10-CM | POA: Diagnosis present

## 2020-08-03 DIAGNOSIS — Z833 Family history of diabetes mellitus: Secondary | ICD-10-CM

## 2020-08-03 DIAGNOSIS — Z86711 Personal history of pulmonary embolism: Secondary | ICD-10-CM

## 2020-08-03 DIAGNOSIS — Z932 Ileostomy status: Secondary | ICD-10-CM

## 2020-08-03 DIAGNOSIS — Z91013 Allergy to seafood: Secondary | ICD-10-CM

## 2020-08-03 DIAGNOSIS — Z832 Family history of diseases of the blood and blood-forming organs and certain disorders involving the immune mechanism: Secondary | ICD-10-CM

## 2020-08-03 DIAGNOSIS — Z6841 Body Mass Index (BMI) 40.0 and over, adult: Secondary | ICD-10-CM

## 2020-08-03 DIAGNOSIS — Z20822 Contact with and (suspected) exposure to covid-19: Secondary | ICD-10-CM | POA: Diagnosis present

## 2020-08-03 DIAGNOSIS — Z8249 Family history of ischemic heart disease and other diseases of the circulatory system: Secondary | ICD-10-CM

## 2020-08-03 DIAGNOSIS — E871 Hypo-osmolality and hyponatremia: Secondary | ICD-10-CM | POA: Diagnosis not present

## 2020-08-03 DIAGNOSIS — Z8679 Personal history of other diseases of the circulatory system: Secondary | ICD-10-CM

## 2020-08-03 DIAGNOSIS — R570 Cardiogenic shock: Secondary | ICD-10-CM | POA: Diagnosis present

## 2020-08-03 DIAGNOSIS — Z452 Encounter for adjustment and management of vascular access device: Secondary | ICD-10-CM

## 2020-08-03 DIAGNOSIS — I4891 Unspecified atrial fibrillation: Secondary | ICD-10-CM | POA: Diagnosis present

## 2020-08-03 DIAGNOSIS — R739 Hyperglycemia, unspecified: Secondary | ICD-10-CM | POA: Diagnosis not present

## 2020-08-03 LAB — CBC WITH DIFFERENTIAL/PLATELET
Abs Immature Granulocytes: 0.29 10*3/uL — ABNORMAL HIGH (ref 0.00–0.07)
Basophils Absolute: 0.1 10*3/uL (ref 0.0–0.1)
Basophils Relative: 1 %
Eosinophils Absolute: 0 10*3/uL (ref 0.0–0.5)
Eosinophils Relative: 0 %
HCT: 48.3 % (ref 39.0–52.0)
Hemoglobin: 15.1 g/dL (ref 13.0–17.0)
Immature Granulocytes: 2 %
Lymphocytes Relative: 8 %
Lymphs Abs: 1.1 10*3/uL (ref 0.7–4.0)
MCH: 30.1 pg (ref 26.0–34.0)
MCHC: 31.3 g/dL (ref 30.0–36.0)
MCV: 96.2 fL (ref 80.0–100.0)
Monocytes Absolute: 1.5 10*3/uL — ABNORMAL HIGH (ref 0.1–1.0)
Monocytes Relative: 10 %
Neutro Abs: 11.9 10*3/uL — ABNORMAL HIGH (ref 1.7–7.7)
Neutrophils Relative %: 79 %
Platelets: 237 10*3/uL (ref 150–400)
RBC: 5.02 MIL/uL (ref 4.22–5.81)
RDW: 15.5 % (ref 11.5–15.5)
WBC: 15 10*3/uL — ABNORMAL HIGH (ref 4.0–10.5)
nRBC: 0.6 % — ABNORMAL HIGH (ref 0.0–0.2)

## 2020-08-03 LAB — BASIC METABOLIC PANEL
Anion gap: 11 (ref 5–15)
BUN: 32 mg/dL — ABNORMAL HIGH (ref 6–20)
CO2: 24 mmol/L (ref 22–32)
Calcium: 8.6 mg/dL — ABNORMAL LOW (ref 8.9–10.3)
Chloride: 101 mmol/L (ref 98–111)
Creatinine, Ser: 2.72 mg/dL — ABNORMAL HIGH (ref 0.61–1.24)
GFR, Estimated: 27 mL/min — ABNORMAL LOW (ref >60)
Glucose, Bld: 135 mg/dL — ABNORMAL HIGH (ref 70–99)
Potassium: 4.2 mmol/L (ref 3.5–5.1)
Sodium: 136 mmol/L (ref 135–145)

## 2020-08-03 LAB — BRAIN NATRIURETIC PEPTIDE: B Natriuretic Peptide: 1426.8 pg/mL — ABNORMAL HIGH (ref 0.0–100.0)

## 2020-08-03 NOTE — Addendum Note (Signed)
Encounter addended by: Jacklynn Ganong, FNP on: 08/04/2020 5:34 PM  Actions taken: Pend clinical note, Delete clinical note

## 2020-08-03 NOTE — Progress Notes (Addendum)
Advanced Heart Failure Clinic Note   Patient ID: Roberto [name], male   DOB: 01-20-1965, 55 y.o.   MRN: [id number] PCP: Dr. Susann Givens Cardiologist: Dr. [name] EP: Dr. Johney Frame AHF: Dr. Gala Romney   HPI: Roberto [name] is a 55 y.o. 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    Admitted to [facility] 12/13 for recurrent HF. Cath with no CAD.   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 EF dropped from 40%-> 15%. Underwent successful DCCV. Started on amiodarone. S/p AFL ablation 11/17/2016  Echo 7/20: EF 25-30%   Admitted 12/19/18- 01/24/19 for acute diverticulits.Underwent R colectomy with ileostomy.  In OR patient became hypotensive, required pressors. Course c/b perforated R colon with right lateral peritoneal fluid collection s/p right lateral abdominal drain placement in IR 01/04/2019. Struggled post-op with non-healing ab wound and underwent skin grafting on 09/05/19. No plans for ileostomy reversal due to hernia.   We saw him on 12/14/19, doing well volume overloaded. Instructed to take 1 dose of metolazone, He took 2 doses & developed hypokalemia. Had syncope with head trauma and ICD shock. ICD interrogation showed VT.   Admitted 4/22 with VT& A/C systolic heart failure. Diuresed with IV lasix. Had RHC with with low filling pressures and normal cardiac output.  Discharged on 04/19/20 with torsemide 40 mg daily.   Admitted 5/22 with recurrent VT. Seen by EP. Treated with amio and mexilitene.   He returned 06/22/20 for post-hospital follow up. Stable NYHA III-IIIb symptoms, volume was good.  He presented to ED 07/19/20 with A/C CHF. Weight 341 lbs. Creatinine 2.23. He was given 80 mg lasix IV and admitted. Subsequently started on 80 mg lasix IV bid. He was eventually transitioned to po torsemide 60 mg bid. Discharge weight 333 lbs.  Today he returns for HF follow up.  He has had N/V x 2 weeks and persistent dry cough. He has worsening SOB. He scraped his right leg on the side of the bed two days ago. Denies CP, dizziness, or PND/Orthopnea. No fever or chills. Weight at home 337 pounds. Taking all medications.   ICD interrogation: Thoracic impedence low, activity near 0, No VT since 06/03/20. (Personally reviewed). ECG: SR IVCD 140 ms, qt 496  Cardiac studies: Echo (12/21): EF 20-25%, RV ok, personally reviewed Echo (07/2017): EF appears slightly improved to 20-25%. Possible clot (vs artifact in IVC/RA junction) Personally reviewed Echo (10/15/16): LVEF 15% Echo (5/14): EF 40-45% Echo (12/13): EF 25-30%  TEE (03/20/17): EF 15-20%, Severe reduced RV function  RHC 04/2020  RA = 2 RV = 24/1 PA = 25/3 (15) PCW = 7 Fick cardiac output/index = 6.0/23 Thermo CO/CI = 6.1/2.4 PVR = 0.7 WU FA sat = 95% PA sat = 67%, 68%  Assessment: 1. Low filling pressures 2. Normal cardiac output  RHC 10/20/16  RA = 3 RV = 45/7 PA = 49/20 (31) PCW = 28 Fick cardiac output/index = 5.0/2.1 PVR = 0.5 WU Ao sat = 95% PA sat = 64%, 61%  CARDIAC MRI - 12/18/11  1. Moderately dilated left ventricle with moderately decreased systolic function, EF 38%. Global hypokinesis.  2. Mildly dilated right ventricle with mild to moderately decreased systolic function.  3. No definite LV thrombus noted.  4. Patchy non-subendocardial delayed enhancement seen in the ventricular septum (see above for description). This is not suggestive of a coronary disease pattern. This  could be suggestive of infiltrative disease versus prior myocarditis.  RHC/LHC 12/22/11  Cors: Normal  SH: Lives with his wife and 5 children. Works full time running a Daycare  Review of systems complete and found to be negative unless listed in HPI.    Past Medical History:  Diagnosis Date   AICD (automatic cardioverter/defibrillator) present 08/2018   Medtronic ICD   Arthritis    knee   Chronic combined  systolic and diastolic CHF (congestive heart failure) (HCC)    Chronic venous insufficiency    Coronary artery disease    Deep vein thrombosis (HCC) 2007   left leg   Diverticulitis    c/b perforated colon and nonhealing abdominal wound s/p skin grafting, ileostomy   Hepatitis    patient denies this dx   Lupus anticoagulant positive    Morbid obesity (HCC)    Morbid obesity with BMI of 50.0-59.9, adult (HCC)    Mural thrombus of heart    coumadin   Nonischemic cardiomyopathy (HCC)    a) 12/17/11 echo: LVEF 25-30%, grade 3 diastolic dysfunction (c/w restriction), mod MR, mod LA/LV and mild RA dilatation; b) 12/18/11 cMRI: LVEF 38%, mod LV/mild RV dilatation, global HK, mild-mod RV sys dysfxn, no LV thrombus & patchy non-subendocardial delayed enhancement c/w infil dz or prior myocarditis; c. 07/2018 EF 25-30%.   Paroxysmal SVT (supraventricular tachycardia) (HCC)    Pneumonia    x 2   Pulmonary embolism (HCC)    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   Ventricular tachyarrhythmia [facility])    Prior VT/VF   Wears glasses    Wound of abdomen     Current Outpatient Medications  Medication Sig Dispense Refill   amiodarone (PACERONE) 200 MG tablet Take 200 mg by mouth 2 (two) times daily.     benzonatate (TESSALON PERLES) 100 MG capsule Take 1 capsule (100 mg total) by mouth 3 (three) times daily as needed for cough. 90 capsule 0   carvedilol (COREG) 6.25 MG tablet Take 1 tablet (6.25 mg total) by mouth 2 (two) times daily with a meal. 60 tablet 3   dapagliflozin propanediol (FARXIGA) 10 MG TABS tablet Take 1 tablet (10 mg total) by mouth daily. 30 tablet 3   digoxin (LANOXIN) 0.125 MG tablet Take 0.5 tablets (0.0625 mg total) by mouth daily. 30 tablet 3   ELIQUIS 5 MG TABS tablet TAKE 1 TABLET(5 MG) BY MOUTH TWICE DAILY 60 tablet 3   mexiletine (MEXITIL) 150 MG capsule Take 2 capsules (300 mg total) by mouth every 12 (twelve) hours. 120 capsule 3   pantoprazole (PROTONIX) 40 MG tablet Take 1  tablet (40 mg total) by mouth daily. 90 tablet 3   potassium chloride SA (KLOR-CON) 20 MEQ tablet Take 2 tablets (40 mEq total) by mouth daily. 60 tablet 3   ranolazine (RANEXA) 500 MG 12 hr tablet Take 1 tablet (500 mg total) by mouth 2 (two) times daily. 60 tablet 3   spironolactone (ALDACTONE) 25 MG tablet Take 2 tablets (50 mg total) by mouth daily. 60 tablet 3   Torsemide 60 MG TABS Take 60 mg by mouth 2 (two) times daily.     No current facility-administered medications for this encounter.   BP (!) 98/59   Pulse 80   Wt (!) 154 kg (339 lb 9.6 oz)   BMI 51.64 kg/m   Wt Readings from Last 3 Encounters:  08/02/2020 (!) 154 kg (339 lb 9.6 oz)  08/11/2020 (!) 154 kg (339 lb  9.6 oz)  07/21/20 (!) 151.5 kg (334 lb 1.6 oz)   PHYSICAL EXAM: General:  NAD. No resp difficulty, arrived in [facility] HEENT: Normal Neck: Supple. JVP to jaw. Carotids 2+ bilat; no bruits. No lymphadenopathy or thryomegaly appreciated. Cor: PMI nondisplaced. Regular rate & rhythm. No rubs, gallops or murmurs. Lungs: Clear Abdomen: + ileostomy, obese, nontender, nondistended. No hepatosplenomegaly. No bruits or masses. Good bowel sounds. Extremities: No cyanosis, clubbing, rash, 3+ RLE w/ weeping clear fluid, +cellulitis Neuro: Alert & oriented x 3, cranial nerves grossly intact. Moves all 4 extremities w/o difficulty. Affect pleasant.  ASSESSMENT & PLAN: Cellulitis, RLE - Evaluate in ED, needs IV abx. - He will need IV diuresis and legs wrapped. - Labs.  2. Acute on Chronic Systolic Heart Failure: - Echo 10/15/16 - LVEF 15%. - TEE 03/2017 LVEF 15-20%. - Echo 07/2017 with LVEF 20-25%. - Echo 7/20 EF 25-30%. - Echo 12/21 EF 25-30%. - NYHA class IIIb-IV symptoms. Volume up on exam, weight up. - He needs IV diuresis, personally discussed with Dr. Gala Romney. - BP soft, may need to back down to 3.125 of carvedilol. - Continue Spiro 50 mg daily - Continue digoxin 0.0625 mcg daily.  - Continue Farxiga 10 mg daily.  -  Off Entresto due to recent AKI. May re-challenge later. - Nearing end-stage. Not candidate for advanced therapies with size, RV dysfunction, CKD, and ileostomy. - Consider enrolling in monthly device monitoring for fluid. - Check BMET, BNP, dig level    2. H/o VT - On 12/16/19 in setting of hypokalemia related to metolazone use. - Admitted 4/22 and 5/22 with recurrent VT.  - Followed by Dr. Elberta Fortis as outpatient. - Not a candidate for ablation at [facility] - Now on amio 200 mg bid and mexilitene. - No driving for 6 months (~83/15). - No recurrent VT on ICD interrogation. - BMET and Mag   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 - No arrhythmias on device check today. - Continue amiodarone. - On Eliquis 5 bid. No bleeding issues.   4. H/o Acute diverticulitis with colon resection and ileostomy - S/p skin grafting 09/05/19. - No plan for reversal of ileostomy after extensive abdominal hernia.   5. CKD Stage IIIb - Creatinine baseline 2.2-2.6   I am sending him to the ED for evaluation. He will likely need IV antibiotics and IV lasix. Leg will need to be wrapped. Personally evaluated with Dr. Gala Romney.  Roberto Rome, FNP-BC 07/16/2020  Patient seen and examined with the above-signed Advanced Practice Provider and/or Housestaff. I personally reviewed laboratory data, imaging studies and relevant notes. I independently examined the patient and formulated the important aspects of the plan. I have edited the note to reflect any of my changes or salient points. I have personally discussed the plan with the patient and/or family.  55 y/o male with morbid obesity, CKD 3b and severe systolic HF due to NICM. Multiple recent admits for volume overload.   Presented to HF Clinic today with severe RLE cellulities and edema after hitting it on a table  General:  Weak appearing. No resp difficulty HEENT: normal Neck: supple. JVP to jaw Carotids 2+ bilat; no bruits. No lymphadenopathy or thryomegaly  appreciated. Cor: PMI nondisplaced. Regular rate & rhythm. No rubs, gallops or murmurs. Lungs: clear Abdomen: obese soft, nontender, nondistended. No hepatosplenomegaly. No bruits or masses. Good bowel sounds. + ostomy Extremities: no cyanosis, clubbing, rash, 3+ edema with severe cellulitis on right with weeping areas. 1+ edema on left Neuro: alert &  orientedx3, cranial nerves grossly intact. moves all 4 extremities w/o difficulty. Affect pleasant  ICD interrogation: No VT/AF volume status ok   He has severe RLE cellulitis and volume overload (despite ICD reading). Will transfer to ED for admission to hospitalist service for treatment of cellulitis. Will likely need to have leg wrapped. Agree with IV diuresis. The HF team will follow in house. Appreciate ED and TRH teams assistance.   Arvilla Meres, MD  6:43 PM

## 2020-08-03 NOTE — Addendum Note (Signed)
Encounter addended by: Dolores Patty, MD on: 07/26/2020 6:45 PM  Actions taken: Level of Service modified, Clinical Note Signed, Charge Capture section accepted

## 2020-08-03 NOTE — Progress Notes (Deleted)
Advanced Heart Failure Clinic Note   Patient ID: Roberto [name], male   DOB: 1965/11/10, 55 y.o.   MRN: [id number] PCP: Dr. Susann Givens Cardiologist: Dr. [name] EP: Dr. Johney Frame AHF: Dr. Gala Romney   HPI: Roberto [name] is a 55 y.o. 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    Admitted to [facility] 12/16/11 for recurrent HF. Cath with no CAD.   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 EF dropped from 40%-> 15%. Underwent successful DCCV. Started on amiodarone. S/p AFL ablation 11/17/2016  Echo 7/20: EF 25-30%   Admitted 12/19/18- 01/24/19 for acute diverticulits.Underwent R colectomy with ileostomy.  In OR patient became hypotensive, required pressors. Course c/b perforated R colon with right lateral peritoneal fluid collection s/p right lateral abdominal drain placement in IR 01/04/2019. Post-op struggling with non-healing ab wound and underwent skin grafting on 09/05/19. No plans fo ileostomy reversal due to hernia.   We saw him on 12/14/19, doing well volume overloaded. Instructed to take 1 dose of metolazone, He took 2 doses & developed hypokalemia. Had syncope with head trauma and ICD shock. ICD interrogation showed VT.   Admitted 4/22  With VT& A/C systolic heart failure. Diuresed with IV lasix. Had RHC with with low filling pressures and normal cardiac output.  Discharged on 04/19/20 with torsemide 40 mg daily.   Admitted 5/22 with recurrent VT. Seen by EP. Treated with amio and mexilitene   He returned 06/22/20 for post-hospital follow up. Stable NYHA III-IIIb symptoms, volume was good.  He presented to ED 07/19/20 with A/C CHF. Weight 341 #. Creatinine 2.23, BNP 959 (773 in May), HS troponin 46. SARS-COV2 negative. Appeared volume overloaded and chest x-ray consistent with CHF. He was given 80 mg lasix IV and admitted. Subsequently started on 80 mg lasix IV BID.  He was eventually transitioned to po torsemide 60 mg bid. Discharge weight 333 lbs.  Today he returns for HF follow up. He has had N/V x 2 weeks and persistent dry cough. He has worsening SOB. He scraped his right leg on the side of the bed two days ago. Denies CP, dizziness, or PND/Orthopnea. No fever or chills. Weight at home 337 pounds. Taking all medications.   ICD interrogation: Thoracic impedence low, activity near 0, No VT since 06/03/20. (Personally reviewed). ECG: SR IVCD 140 ms, qt 496  Cardiac studies: Echo (12/21): EF 20-25%, RV ok, personally reviewed Echo (07/2017): EF appears slightly improved to 20-25%. Possible clot (vs artifact in IVC/RA junction) Personally reviewed Echo (10/15/16): LVEF 15% Echo (5/14): EF 40-45% Echo (12/13): EF 25-30%  TEE (03/20/17): EF 15-20%, Severe reduced RV function  RHC 04/2020  RA = 2 RV = 24/1 PA = 25/3 (15) PCW = 7 Fick cardiac output/index = 6.0/23 Thermo CO/CI = 6.1/2.4 PVR = 0.7 WU FA sat = 95% PA sat = 67%, 68%  Assessment: 1. Low filling pressures 2. Normal cardiac output  RHC 10/20/16  RA = 3 RV = 45/7 PA = 49/20 (31) PCW = 28 Fick cardiac output/index = 5.0/2.1 PVR = 0.5 WU Ao sat = 95% PA sat = 64%, 61%  CARDIAC MRI - 12/18/11  1. Moderately dilated left ventricle with moderately decreased systolic function, EF 38%. Global hypokinesis.  2. Mildly dilated right ventricle with mild to moderately decreased systolic function.  3. No definite LV thrombus noted.  4. Patchy non-subendocardial delayed  enhancement seen in the ventricular septum (see above for description). This is not suggestive of a coronary disease pattern. This could be suggestive of infiltrative disease versus prior myocarditis.  RHC/LHC 12/22/11  Cors: Normal  SH: Lives with his wife and 5 children. Works full time running a Daycare  Review of systems complete and found to be negative unless listed in HPI.    Past Medical History:  Diagnosis Date    AICD (automatic cardioverter/defibrillator) present 08/2018   Medtronic ICD   Arthritis    knee   Chronic combined systolic and diastolic CHF (congestive heart failure) (HCC)    Chronic venous insufficiency    Coronary artery disease    Deep vein thrombosis (HCC) 2007   left leg   Diverticulitis    c/b perforated colon and nonhealing abdominal wound s/p skin grafting, ileostomy   Hepatitis    patient denies this dx   Lupus anticoagulant positive    Morbid obesity (HCC)    Morbid obesity with BMI of 50.0-59.9, adult (HCC)    Mural thrombus of heart    coumadin   Nonischemic cardiomyopathy (HCC)    a) 12/17/11 echo: LVEF 25-30%, grade 3 diastolic dysfunction (c/w restriction), mod MR, mod LA/LV and mild RA dilatation; b) 12/18/11 cMRI: LVEF 38%, mod LV/mild RV dilatation, global HK, mild-mod RV sys dysfxn, no LV thrombus & patchy non-subendocardial delayed enhancement c/w infil dz or prior myocarditis; c. 07/2018 EF 25-30%.   Paroxysmal SVT (supraventricular tachycardia) (HCC)    Pneumonia    x 2   Pulmonary embolism (HCC)    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   Ventricular tachyarrhythmia [facility])    Prior VT/VF   Wears glasses    Wound of abdomen     Current Outpatient Medications  Medication Sig Dispense Refill   amiodarone (PACERONE) 200 MG tablet Take 200 mg by mouth 2 (two) times daily.     benzonatate (TESSALON PERLES) 100 MG capsule Take 1 capsule (100 mg total) by mouth 3 (three) times daily as needed for cough. 90 capsule 0   carvedilol (COREG) 6.25 MG tablet Take 1 tablet (6.25 mg total) by mouth 2 (two) times daily with a meal. 60 tablet 3   dapagliflozin propanediol (FARXIGA) 10 MG TABS tablet Take 1 tablet (10 mg total) by mouth daily. 30 tablet 3   digoxin (LANOXIN) 0.125 MG tablet Take 0.5 tablets (0.0625 mg total) by mouth daily. 30 tablet 3   ELIQUIS 5 MG TABS tablet TAKE 1 TABLET(5 MG) BY MOUTH TWICE DAILY 60 tablet 3   mexiletine (MEXITIL) 150 MG capsule Take 2  capsules (300 mg total) by mouth every 12 (twelve) hours. 120 capsule 3   pantoprazole (PROTONIX) 40 MG tablet Take 1 tablet (40 mg total) by mouth daily. 90 tablet 3   potassium chloride SA (KLOR-CON) 20 MEQ tablet Take 2 tablets (40 mEq total) by mouth daily. 60 tablet 3   ranolazine (RANEXA) 500 MG 12 hr tablet Take 1 tablet (500 mg total) by mouth 2 (two) times daily. 60 tablet 3   spironolactone (ALDACTONE) 25 MG tablet Take 2 tablets (50 mg total) by mouth daily. 60 tablet 3   Torsemide 60 MG TABS Take 60 mg by mouth 2 (two) times daily.     No current facility-administered medications for this encounter.   BP (!) 98/59   Pulse 80   Wt (!) 154 kg (339 lb 9.6 oz)   BMI 51.64 kg/m   Wt Readings  from Last 3 Encounters:  08/02/2020 (!) 154 kg (339 lb 9.6 oz)  07/21/20 (!) 151.5 kg (334 lb 1.6 oz)  07/06/20 (!) 154.7 kg (341 lb)   PHYSICAL EXAM: General:  NAD. No resp difficulty, arrived in [facility] HEENT: Normal Neck: Supple. JVP to jaw. Carotids 2+ bilat; no bruits. No lymphadenopathy or thryomegaly appreciated. Cor: PMI nondisplaced. Regular rate & rhythm. No rubs, gallops or murmurs. Lungs: Clear Abdomen: + ileostomy, obese, nontender, nondistended. No hepatosplenomegaly. No bruits or masses. Good bowel sounds. Extremities: No cyanosis, clubbing, rash, 3+ RLE w/ weeping clear fluid, +cellulitis Neuro: Alert & oriented x 3, cranial nerves grossly intact. Moves all 4 extremities w/o difficulty. Affect pleasant.  ASSESSMENT & PLAN: Cellulitis, RLE - Evaluate in ED, needs IV abx. - He will need IV diuresis and legs wrapped. - Labs.  2. Acute on Chronic Systolic Heart Failure: - Echo 10/15/16 - LVEF 15%. - TEE 03/2017 LVEF 15-20%. - Echo 07/2017 with LVEF 20-25%. - Echo 7/20 EF 25-30%. - Echo 12/21 EF 25-30%. - NYHA class IIIb-IV symptoms. Volume up on exam, weight up. - He needs IV diuresis, personally discussed with Dr. Gala Romney. - BP soft, may need to back down to 3.125 of  carvedilol. - Continue Spiro 50 mg daily - Continue digoxin 0.0625 mcg daily.  - Continue Farxiga 10 mg daily.  - Off Entresto due to recent AKI. May re-challenge later. - Nearing end-stage. Not candidate for advanced therapies with size, RV dysfunction, CKD, and ileostomy. - Consider enrolling in monthly device monitoring for fluid. - Check BMET, BNP, dig level    2. H/o VT - On 12/16/19 in setting of hypokalemia related to metolazone use. - Admitted 4/22 and 5/22 with recurrent VT.  - Followed by Dr. Elberta Fortis as outpatient. - Not a candidate for ablation at [facility] - Now on amio 200 mg BID and mexilitene. - No driving for 6 months (~74/94). - No recurrent VT on ICD interrogation. - BMET and Mag   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 - No arrhythmias on device check today. - Continue amiodarone. - On Eliquis 5 bid. No bleeding issues.   4. H/o Acute diverticulitis with colon resection and ileostomy - S/p skin grafting 09/05/19. - No plan for reversal of ileostomy after extensive abdominal hernia.   5. CKD Stage IIIb - Creatinine baseline 2.2-2.6   I am sending him to the ED for evaluation. He will likely need IV antibiotics and IV lasix. Leg will need to be wrapped. Personally discussed with Dr. Gala Romney.  Prince Rome, FNP-BC 07/14/2020

## 2020-08-03 NOTE — ED Triage Notes (Signed)
Pt sent from HF clinic due to cough, leg swelling and right leg redness. Pt states he hit his leg on his bed.

## 2020-08-03 NOTE — Addendum Note (Signed)
Encounter addended by: Jacklynn Ganong, FNP on: Aug 21, 2020 5:35 PM  Actions taken: Clinical Note Signed

## 2020-08-03 NOTE — ED Provider Notes (Signed)
Emergency Medicine Provider Triage Evaluation Note  Roberto [name] , a 55 y.o. male  was evaluated in triage.  Pt complains of shortness of breath and cellulitis.  Has been feeling short of breath for the last 2 weeks, states when he lays down flat.  He also has bilateral edema and cellulitis to the left leg.  Cellulitis started earlier today..  Review of Systems  Positive: Shortness of breath, cellulitis, orthopnea, cough Negative: Fever, chills  Physical Exam  BP 95/74   Pulse 76   Temp 98.3 F (36.8 C)   Resp 18   Ht 5\' 8"  (1.727 m)   Wt (!) 154 kg   SpO2 96%   BMI 51.64 kg/m  Gen:   Awake, no distress   Resp:  Normal effort  MSK:   Moves extremities without difficulty  Other:  Pitting edema, cellulitis to left leg.   Medical Decision Making  Medically screening exam initiated at 5:05 PM.  Appropriate orders placed.  Roberto [name] was informed that the remainder of the evaluation will be completed by another provider, this initial triage assessment does not replace that evaluation, and the importance of remaining in the ED until their evaluation is complete.     Mable Fill, PA-C 08/07/2020 1706    08/05/20, MD 08/04/20 [phone]

## 2020-08-04 ENCOUNTER — Inpatient Hospital Stay (HOSPITAL_COMMUNITY): Payer: BC Managed Care – PPO

## 2020-08-04 ENCOUNTER — Inpatient Hospital Stay: Payer: Self-pay

## 2020-08-04 ENCOUNTER — Encounter (HOSPITAL_COMMUNITY): Payer: Self-pay | Admitting: Internal Medicine

## 2020-08-04 DIAGNOSIS — I484 Atypical atrial flutter: Secondary | ICD-10-CM | POA: Diagnosis present

## 2020-08-04 DIAGNOSIS — Z66 Do not resuscitate: Secondary | ICD-10-CM | POA: Diagnosis not present

## 2020-08-04 DIAGNOSIS — J9601 Acute respiratory failure with hypoxia: Secondary | ICD-10-CM | POA: Diagnosis not present

## 2020-08-04 DIAGNOSIS — D6862 Lupus anticoagulant syndrome: Secondary | ICD-10-CM | POA: Diagnosis present

## 2020-08-04 DIAGNOSIS — E872 Acidosis: Secondary | ICD-10-CM | POA: Diagnosis not present

## 2020-08-04 DIAGNOSIS — I428 Other cardiomyopathies: Secondary | ICD-10-CM | POA: Diagnosis present

## 2020-08-04 DIAGNOSIS — N170 Acute kidney failure with tubular necrosis: Secondary | ICD-10-CM | POA: Diagnosis present

## 2020-08-04 DIAGNOSIS — Z6841 Body Mass Index (BMI) 40.0 and over, adult: Secondary | ICD-10-CM | POA: Diagnosis not present

## 2020-08-04 DIAGNOSIS — Z7901 Long term (current) use of anticoagulants: Secondary | ICD-10-CM | POA: Diagnosis not present

## 2020-08-04 DIAGNOSIS — N1832 Chronic kidney disease, stage 3b: Secondary | ICD-10-CM | POA: Diagnosis present

## 2020-08-04 DIAGNOSIS — I509 Heart failure, unspecified: Secondary | ICD-10-CM | POA: Insufficient documentation

## 2020-08-04 DIAGNOSIS — R570 Cardiogenic shock: Secondary | ICD-10-CM | POA: Diagnosis present

## 2020-08-04 DIAGNOSIS — I130 Hypertensive heart and chronic kidney disease with heart failure and stage 1 through stage 4 chronic kidney disease, or unspecified chronic kidney disease: Secondary | ICD-10-CM | POA: Diagnosis present

## 2020-08-04 DIAGNOSIS — I5043 Acute on chronic combined systolic (congestive) and diastolic (congestive) heart failure: Secondary | ICD-10-CM

## 2020-08-04 DIAGNOSIS — L03115 Cellulitis of right lower limb: Secondary | ICD-10-CM | POA: Diagnosis present

## 2020-08-04 DIAGNOSIS — E871 Hypo-osmolality and hyponatremia: Secondary | ICD-10-CM | POA: Diagnosis not present

## 2020-08-04 DIAGNOSIS — N179 Acute kidney failure, unspecified: Secondary | ICD-10-CM | POA: Diagnosis not present

## 2020-08-04 DIAGNOSIS — I5021 Acute systolic (congestive) heart failure: Secondary | ICD-10-CM | POA: Diagnosis not present

## 2020-08-04 DIAGNOSIS — J690 Pneumonitis due to inhalation of food and vomit: Secondary | ICD-10-CM | POA: Diagnosis not present

## 2020-08-04 DIAGNOSIS — I5084 End stage heart failure: Secondary | ICD-10-CM | POA: Diagnosis present

## 2020-08-04 DIAGNOSIS — R6521 Severe sepsis with septic shock: Secondary | ICD-10-CM | POA: Diagnosis present

## 2020-08-04 DIAGNOSIS — R609 Edema, unspecified: Secondary | ICD-10-CM | POA: Diagnosis not present

## 2020-08-04 DIAGNOSIS — Z20822 Contact with and (suspected) exposure to covid-19: Secondary | ICD-10-CM | POA: Diagnosis present

## 2020-08-04 DIAGNOSIS — Z515 Encounter for palliative care: Secondary | ICD-10-CM | POA: Diagnosis not present

## 2020-08-04 DIAGNOSIS — I5023 Acute on chronic systolic (congestive) heart failure: Secondary | ICD-10-CM | POA: Diagnosis not present

## 2020-08-04 DIAGNOSIS — I081 Rheumatic disorders of both mitral and tricuspid valves: Secondary | ICD-10-CM | POA: Diagnosis present

## 2020-08-04 DIAGNOSIS — I472 Ventricular tachycardia: Secondary | ICD-10-CM | POA: Diagnosis present

## 2020-08-04 DIAGNOSIS — A419 Sepsis, unspecified organism: Secondary | ICD-10-CM | POA: Diagnosis present

## 2020-08-04 DIAGNOSIS — R0602 Shortness of breath: Secondary | ICD-10-CM | POA: Diagnosis not present

## 2020-08-04 DIAGNOSIS — K567 Ileus, unspecified: Secondary | ICD-10-CM | POA: Diagnosis not present

## 2020-08-04 LAB — LACTIC ACID, PLASMA
Lactic Acid, Venous: 3.6 mmol/L (ref 0.5–1.9)
Lactic Acid, Venous: 3.5 mmol/L (ref 0.5–1.9)

## 2020-08-04 LAB — COMPREHENSIVE METABOLIC PANEL
ALT: 57 U/L — ABNORMAL HIGH (ref 0–44)
AST: 26 U/L (ref 15–41)
Albumin: 2.1 g/dL — ABNORMAL LOW (ref 3.5–5.0)
Alkaline Phosphatase: 56 U/L (ref 38–126)
Anion gap: 11 (ref 5–15)
BUN: 33 mg/dL — ABNORMAL HIGH (ref 6–20)
CO2: 23 mmol/L (ref 22–32)
Calcium: 8.5 mg/dL — ABNORMAL LOW (ref 8.9–10.3)
Chloride: 103 mmol/L (ref 98–111)
Creatinine, Ser: 2.4 mg/dL — ABNORMAL HIGH (ref 0.61–1.24)
GFR, Estimated: 31 mL/min — ABNORMAL LOW (ref >60)
Glucose, Bld: 200 mg/dL — ABNORMAL HIGH (ref 70–99)
Potassium: 4 mmol/L (ref 3.5–5.1)
Sodium: 137 mmol/L (ref 135–145)
Total Bilirubin: 1.2 mg/dL (ref 0.3–1.2)
Total Protein: 6.5 g/dL (ref 6.5–8.1)

## 2020-08-04 LAB — CBC WITH DIFFERENTIAL/PLATELET
Abs Immature Granulocytes: 0.28 10*3/uL — ABNORMAL HIGH (ref 0.00–0.07)
Basophils Absolute: 0.1 10*3/uL (ref 0.0–0.1)
Basophils Relative: 0 %
Eosinophils Absolute: 0.1 10*3/uL (ref 0.0–0.5)
Eosinophils Relative: 0 %
HCT: 45.8 % (ref 39.0–52.0)
Hemoglobin: 14.7 g/dL (ref 13.0–17.0)
Immature Granulocytes: 2 %
Lymphocytes Relative: 7 %
Lymphs Abs: 1.1 10*3/uL (ref 0.7–4.0)
MCH: 30.6 pg (ref 26.0–34.0)
MCHC: 32.1 g/dL (ref 30.0–36.0)
MCV: 95.2 fL (ref 80.0–100.0)
Monocytes Absolute: 1.1 10*3/uL — ABNORMAL HIGH (ref 0.1–1.0)
Monocytes Relative: 8 %
Neutro Abs: 12.4 10*3/uL — ABNORMAL HIGH (ref 1.7–7.7)
Neutrophils Relative %: 83 %
Platelets: 202 10*3/uL (ref 150–400)
RBC: 4.81 MIL/uL (ref 4.22–5.81)
RDW: 15.5 % (ref 11.5–15.5)
WBC: 15 10*3/uL — ABNORMAL HIGH (ref 4.0–10.5)
nRBC: 0.2 % (ref 0.0–0.2)

## 2020-08-04 LAB — RESP PANEL BY RT-PCR (FLU A&B, COVID) ARPGX2
Influenza A by PCR: NEGATIVE
Influenza B by PCR: NEGATIVE
SARS Coronavirus 2 by RT PCR: NEGATIVE

## 2020-08-04 LAB — DIGOXIN LEVEL: Digoxin Level: 0.4 ng/mL — ABNORMAL LOW (ref 0.8–2.0)

## 2020-08-04 MED ORDER — SODIUM CHLORIDE 0.9% FLUSH: 10.0000 mL | INTRAVENOUS | Status: DC | PRN

## 2020-08-04 MED ORDER — ACETAMINOPHEN 650 MG RE SUPP: 650.0000 mg | Freq: Four times a day (QID) | RECTAL | Status: DC | PRN

## 2020-08-04 MED ORDER — CHLORHEXIDINE GLUCONATE CLOTH 2 % EX PADS
6.0000 | MEDICATED_PAD | Freq: Every day | CUTANEOUS | Status: DC
  Administered 2020-08-04 – 2020-08-20 (×17): 6 via TOPICAL

## 2020-08-04 MED ORDER — PANTOPRAZOLE SODIUM 40 MG PO TBEC
40.0000 mg | DELAYED_RELEASE_TABLET | Freq: Every day | ORAL | Status: DC
  Administered 2020-08-04 – 2020-08-16 (×13): 40 mg via ORAL
  Filled 2020-08-04 (×13): qty 1

## 2020-08-04 MED ORDER — VANCOMYCIN HCL 2000 MG/400ML IV SOLN
2000.0000 mg | INTRAVENOUS | Status: DC
Start: 2020-08-06 — End: 2020-08-05

## 2020-08-04 MED ORDER — VANCOMYCIN HCL 10 G IV SOLR
2500.0000 mg | Freq: Once | INTRAVENOUS | Status: AC
  Administered 2020-08-04: 2500 mg via INTRAVENOUS
  Filled 2020-08-04: qty 2500

## 2020-08-04 MED ORDER — APIXABAN 5 MG PO TABS
5.0000 mg | ORAL_TABLET | Freq: Two times a day (BID) | ORAL | Status: DC
  Administered 2020-08-04 – 2020-08-16 (×25): 5 mg via ORAL
  Filled 2020-08-04 (×25): qty 1

## 2020-08-04 MED ORDER — SODIUM CHLORIDE 0.9% FLUSH
10.0000 mL | Freq: Two times a day (BID) | INTRAVENOUS | Status: DC
Start: 2020-08-04 — End: 2020-08-20
  Administered 2020-08-04 – 2020-08-07 (×5): 10 mL
  Administered 2020-08-07: 20 mL
  Administered 2020-08-08 (×2): 40 mL
  Administered 2020-08-09 – 2020-08-12 (×5): 10 mL
  Administered 2020-08-12: 40 mL
  Administered 2020-08-13 – 2020-08-20 (×9): 10 mL

## 2020-08-04 MED ORDER — GUAIFENESIN-DM 100-10 MG/5ML PO SYRP
5.0000 mL | ORAL_SOLUTION | ORAL | Status: DC | PRN
  Administered 2020-08-04 – 2020-08-07 (×6): 5 mL via ORAL
  Filled 2020-08-04 (×6): qty 5

## 2020-08-04 MED ORDER — DAPAGLIFLOZIN PROPANEDIOL 10 MG PO TABS
10.0000 mg | ORAL_TABLET | Freq: Every day | ORAL | Status: DC
  Administered 2020-08-05 – 2020-08-10 (×6): 10 mg via ORAL
  Filled 2020-08-04 (×7): qty 1

## 2020-08-04 MED ORDER — VANCOMYCIN HCL IN DEXTROSE 1-5 GM/200ML-% IV SOLN: 1000.0000 mg | Freq: Once | INTRAVENOUS | Status: DC

## 2020-08-04 MED ORDER — RANOLAZINE ER 500 MG PO TB12
500.0000 mg | ORAL_TABLET | Freq: Two times a day (BID) | ORAL | Status: DC
  Administered 2020-08-04 – 2020-08-16 (×24): 500 mg via ORAL
  Filled 2020-08-04 (×25): qty 1

## 2020-08-04 MED ORDER — DIGOXIN 125 MCG PO TABS
0.0625 mg | ORAL_TABLET | Freq: Every day | ORAL | Status: DC
  Administered 2020-08-04 – 2020-08-10 (×7): 0.0625 mg via ORAL
  Filled 2020-08-04 (×7): qty 1

## 2020-08-04 MED ORDER — SODIUM CHLORIDE 0.9 % IV SOLN
2.0000 g | Freq: Two times a day (BID) | INTRAVENOUS | Status: DC
  Administered 2020-08-04 – 2020-08-05 (×2): 2 g via INTRAVENOUS
  Filled 2020-08-04 (×2): qty 2

## 2020-08-04 MED ORDER — ACETAMINOPHEN 325 MG PO TABS
650.0000 mg | ORAL_TABLET | Freq: Four times a day (QID) | ORAL | Status: DC | PRN
  Administered 2020-08-04 – 2020-08-08 (×5): 650 mg via ORAL
  Filled 2020-08-04 (×6): qty 2

## 2020-08-04 MED ORDER — SODIUM CHLORIDE 0.9 % IV SOLN
2.0000 g | Freq: Once | INTRAVENOUS | Status: AC
  Administered 2020-08-04: 2 g via INTRAVENOUS
  Filled 2020-08-04: qty 2

## 2020-08-04 MED ORDER — MEXILETINE HCL 150 MG PO CAPS
300.0000 mg | ORAL_CAPSULE | Freq: Two times a day (BID) | ORAL | Status: DC
  Administered 2020-08-04 – 2020-08-16 (×24): 300 mg via ORAL
  Filled 2020-08-04 (×27): qty 2

## 2020-08-04 MED ORDER — FUROSEMIDE 10 MG/ML IJ SOLN
40.0000 mg | Freq: Once | INTRAMUSCULAR | Status: DC
  Filled 2020-08-04: qty 4

## 2020-08-04 MED ORDER — AMIODARONE HCL 200 MG PO TABS
200.0000 mg | ORAL_TABLET | Freq: Two times a day (BID) | ORAL | Status: DC
  Administered 2020-08-04 – 2020-08-07 (×7): 200 mg via ORAL
  Filled 2020-08-04 (×7): qty 1

## 2020-08-04 MED ORDER — FUROSEMIDE 10 MG/ML IJ SOLN
80.0000 mg | Freq: Two times a day (BID) | INTRAMUSCULAR | Status: DC
  Administered 2020-08-04 – 2020-08-05 (×3): 80 mg via INTRAVENOUS
  Filled 2020-08-04 (×3): qty 8

## 2020-08-04 NOTE — H&P (Signed)
History and Physical    Roberto [name] FBP:[id number] DOB: Apr 04, 1965 DOA: 08/02/2020  PCP: [name], MD  Patient coming from: Home.  Chief Complaint: Right lower extremity redness.  Shortness of breath.  HPI: Roberto [name] is a 55 y.o. male with history of chronic systolic heart failure last EF measured in December 2021 was 25 to 30% with grade 3 diastolic dysfunction was recently admitted and discharged home on July 21, 2020 for acute on chronic systolic heart failure had followed up with Dr. Gala Romney for his CHF.  Patient states he had gained at least 5 pounds since discharge.  Over the last 2 days patient also noticed increasing redness of his right lower extremity after he scratched his leg on the bed railing.  Had some discharge from a small opening in the leg.  Denies any fever chills.  Patient was referred to the ER by Dr. Prescott Gum office.  ED Course: In the ER patient blood pressure is in the low normal at times reaching systolic of 89.  Afebrile.  Right lower extremity is erythematous with some discharge involving the right below-knee and ankle.  Chest x-ray shows features concerning for CHF.  Labs show BNP of 1400 and creatinine of 2.7 WBC of 15.  Digoxin level is 0.4 COVID test was negative.  Review of Systems: As per HPI, rest all negative.   Past Medical History:  Diagnosis Date   AICD (automatic cardioverter/defibrillator) present 08/2018   Medtronic ICD   Arthritis    knee   Chronic combined systolic and diastolic CHF (congestive heart failure) (HCC)    Chronic venous insufficiency    Coronary artery disease    Deep vein thrombosis (HCC) 2007   left leg   Diverticulitis    c/b perforated colon and nonhealing abdominal wound s/p skin grafting, ileostomy   Hepatitis    patient denies this dx   Lupus anticoagulant positive    Morbid obesity (HCC)    Morbid obesity with BMI of 50.0-59.9, adult (HCC)    Mural thrombus of heart    coumadin   Nonischemic  cardiomyopathy (HCC)    a) 12/17/11 echo: LVEF 25-30%, grade 3 diastolic dysfunction (c/w restriction), mod MR, mod LA/LV and mild RA dilatation; b) 12/18/11 cMRI: LVEF 38%, mod LV/mild RV dilatation, global HK, mild-mod RV sys dysfxn, no LV thrombus & patchy non-subendocardial delayed enhancement c/w infil dz or prior myocarditis; c. 07/2018 EF 25-30%.   Paroxysmal SVT (supraventricular tachycardia) (HCC)    Pneumonia    x 2   Pulmonary embolism (HCC)    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   Ventricular tachyarrhythmia [facility])    Prior VT/VF   Wears glasses    Wound of abdomen     Past Surgical History:  Procedure Laterality Date   A-FLUTTER ABLATION N/A 11/17/2016   Procedure: A-FLUTTER ABLATION;  Surgeon: Regan Lemming, MD;  Location: [facility] INVASIVE CV LAB;  Service: Cardiovascular;  Laterality: N/A;   APPLICATION OF WOUND VAC N/A 09/05/2019   Procedure: APPLICATION OF WOUND VAC;  Surgeon: Allena Napoleon, MD;  Location: [facility] OR;  Service: Plastics;  Laterality: N/A;  PATIENT'S HOME WOUND VAC APPLIED.   BIOPSY  12/23/2018   Procedure: BIOPSY;  Surgeon: Meridee Score Netty Starring., MD;  Location: [facility] ENDOSCOPY;  Service: Gastroenterology;;   BIOPSY  01/23/2020   Procedure: BIOPSY;  Surgeon: Lemar Lofty., MD;  Location: [facility] ENDOSCOPY;  Service: Gastroenterology;;   CARDIAC CATHETERIZATION  06/2006  Angiographically normal cors   CARDIAC CATHETERIZATION  12/22/2011   R/LHC: normal cors, well-compensated HDs, LV dysfxn   CARDIOVERSION N/A 10/17/2016   Procedure: CARDIOVERSION;  Surgeon: Dolores PattyBensimhon, Daniel R, MD;  [facility];  Service: Cardiovascular;  Laterality: N/A;   CARDIOVERSION N/A 03/20/2017   Procedure: CARDIOVERSION;  Surgeon: Dolores PattyBensimhon, Daniel R, MD;  [facility];  Service: Cardiovascular;  Laterality: N/A;   COLECTOMY N/A 12/28/2018   Procedure: TOTAL COLECTOMY;  Surgeon: Emelia LoronWakefield, Matthew, MD;  [facility];  Service: General;  Laterality: N/A;    FLEXIBLE SIGMOIDOSCOPY N/A 12/23/2018   Procedure: FLEXIBLE SIGMOIDOSCOPY;  Surgeon: Lemar LoftyMansouraty, Gabriel Jr., MD;  [facility];  Service: Gastroenterology;  Laterality: N/A;   FLEXIBLE SIGMOIDOSCOPY N/A 01/23/2020   Procedure: FLEXIBLE SIGMOIDOSCOPY;  Surgeon: Meridee ScoreMansouraty, Netty StarringGabriel Jr., MD;  [facility];  Service: Gastroenterology;  Laterality: N/A;   ICD IMPLANT N/A 09/03/2018   Procedure: ICD Medtronic IMPLANT;  Surgeon: Regan Lemmingamnitz, Will Martin, MD;  [facility];  Service: Cardiovascular;  Laterality: N/A;   ILEOSTOMY N/A 12/28/2018   Procedure: ILEOSTOMY;  Surgeon: Emelia LoronWakefield, Matthew, MD;  [facility];  Service: General;  Laterality: N/A;   IR RADIOLOGIST EVAL & MGMT  02/08/2019   JOINT REPLACEMENT     right knee   LEFT AND RIGHT HEART CATHETERIZATION WITH CORONARY ANGIOGRAM N/A 12/22/2011   Procedure: LEFT AND RIGHT HEART CATHETERIZATION WITH CORONARY ANGIOGRAM;  Surgeon: Dolores Pattyaniel R Bensimhon, MD;  [facility];  Service: Cardiovascular;  Laterality: N/A;   PATELLAR TENDON REPAIR     Left   RIGHT HEART CATH N/A 10/20/2016   Procedure: RIGHT HEART CATH;  Surgeon: Dolores PattyBensimhon, Daniel R, MD;  [facility];  Service: Cardiovascular;  Laterality: N/A;   RIGHT HEART CATH N/A 04/17/2020   Procedure: RIGHT HEART CATH;  Surgeon: Dolores PattyBensimhon, Daniel R, MD;  [facility];  Service: Cardiovascular;  Laterality: N/A;   SKIN SPLIT GRAFT N/A 09/05/2019   Procedure: SKIN GRAFT SPLIT THICKNESS;  Surgeon: Allena NapoleonPace, Collier S, MD;  Location: [facility] OR;  Service: Plastics;  Laterality: N/A;   TEE WITHOUT CARDIOVERSION N/A 03/20/2017   Procedure: TRANSESOPHAGEAL ECHOCARDIOGRAM (TEE);  Surgeon: Dolores PattyBensimhon, Daniel R, MD;  [facility];  Service: Cardiovascular;  Laterality: N/A;     reports that he has never smoked. He has never used smokeless tobacco. He reports previous alcohol use. He reports that he does not use drugs.  Allergies  Allergen  Reactions   Shrimp [Shellfish Allergy] Hives and Itching    Family History  Problem Relation Age of Onset   Hypertension Mother    Clotting disorder Mother        mom with PEs   Cancer Mother        uterine cancer   Diabetes Father    Heart attack Father    Hypertension Other        Sibling   Diabetes Other    Obesity Other    Diabetes Sister    Obesity Sister    Obesity Sister    Colon cancer Neg Hx    Stomach cancer Neg Hx    Esophageal cancer Neg Hx    Rectal cancer Neg Hx    Inflammatory bowel disease Neg Hx    Liver disease Neg Hx    Pancreatic cancer Neg Hx     Prior to Admission medications   Medication Sig Start Date End Date Taking? Authorizing Provider  amiodarone (PACERONE) 200 MG  tablet Take 200 mg by mouth 2 (two) times daily.    [provider]  benzonatate (TESSALON PERLES) 100 MG capsule Take 1 capsule (100 mg total) by mouth 3 (three) times daily as needed for cough. 07/27/20   Bensimhon, Bevelyn Buckles, MD  carvedilol (COREG) 6.25 MG tablet Take 1 tablet (6.25 mg total) by mouth 2 (two) times daily with a meal. 06/10/20   Duke Salvia, MD  dapagliflozin propanediol (FARXIGA) 10 MG TABS tablet Take 1 tablet (10 mg total) by mouth daily. 05/29/20   Laurey Morale, MD  digoxin (LANOXIN) 0.125 MG tablet Take 0.5 tablets (0.0625 mg total) by mouth daily. 06/22/20   Bensimhon, Bevelyn Buckles, MD  ELIQUIS 5 MG TABS tablet TAKE 1 TABLET(5 MG) BY MOUTH TWICE DAILY 04/27/20   Robbie Lis M, PA-C  mexiletine (MEXITIL) 150 MG capsule Take 2 capsules (300 mg total) by mouth every 12 (twelve) hours. 06/10/20   Duke Salvia, MD  pantoprazole (PROTONIX) 40 MG tablet Take 1 tablet (40 mg total) by mouth daily. 06/22/20   Bensimhon, Bevelyn Buckles, MD  potassium chloride SA (KLOR-CON) 20 MEQ tablet Take 2 tablets (40 mEq total) by mouth daily. 07/11/20   Bensimhon, Bevelyn Buckles, MD  ranolazine (RANEXA) 500 MG 12 hr tablet Take 1 tablet (500 mg total) by mouth 2 (two) times daily.  06/10/20   Duke Salvia, MD  spironolactone (ALDACTONE) 25 MG tablet Take 2 tablets (50 mg total) by mouth daily. 06/10/20   Duke Salvia, MD  Torsemide 60 MG TABS Take 60 mg by mouth 2 (two) times daily.    [provider]    Physical Exam: Constitutional: Moderately built and nourished. Vitals:   08/04/20 0318 08/04/20 0420 08/04/20 0515 08/04/20 0545  BP: 98/62 98/60 (!) 89/65 91/68  Pulse: 77  78 80  Resp: (!) 29  (!) 24 (!) 25  Temp:      TempSrc:      SpO2: 97%  96% 98%  Weight:      Height:       Eyes: Anicteric no pallor. ENMT: No discharge from the ears eyes nose and mouth. Neck: JVD not appreciated no mass felt. Respiratory: No rhonchi or crepitations. Cardiovascular: S1-S2 heard. Abdomen: Soft nontender bowel sounds present. Musculoskeletal: Bilateral lower extremity edema present. Skin: Right lower extremity has erythema extending from the knee to the ankle.  There is a small ulceration on the anterior shin. Neurologic: Alert awake oriented time place and person.  Moves all extremities. Psychiatric: Appears normal.  Normal affect.   Labs on Admission: I have personally reviewed following labs and imaging studies  CBC: Recent Labs  Lab 07/16/2020 1707  WBC 15.0*  NEUTROABS 11.9*  HGB 15.1  HCT 48.3  MCV 96.2  PLT 237   Basic Metabolic Panel: Recent Labs  Lab 08/10/2020 1707  NA 136  K 4.2  CL 101  CO2 24  GLUCOSE 135*  BUN 32*  CREATININE 2.72*  CALCIUM 8.6*   GFR: Estimated Creatinine Clearance: 45.1 mL/min (A) (by C-G formula based on SCr of 2.72 mg/dL (H)). Liver Function Tests: No results for input(s): AST, ALT, ALKPHOS, BILITOT, PROT, ALBUMIN in the last 168 hours. No results for input(s): LIPASE, AMYLASE in the last 168 hours. No results for input(s): AMMONIA in the last 168 hours. Coagulation Profile: No results for input(s): INR, PROTIME in the last 168 hours. Cardiac Enzymes: No results for input(s): CKTOTAL, CKMB,  CKMBINDEX, TROPONINI in the last 168  hours. BNP (last 3 results) No results for input(s): PROBNP in the last 8760 hours. HbA1C: No results for input(s): HGBA1C in the last 72 hours. CBG: No results for input(s): GLUCAP in the last 168 hours. Lipid Profile: No results for input(s): CHOL, HDL, LDLCALC, TRIG, CHOLHDL, LDLDIRECT in the last 72 hours. Thyroid Function Tests: No results for input(s): TSH, T4TOTAL, FREET4, T3FREE, THYROIDAB in the last 72 hours. Anemia Panel: No results for input(s): VITAMINB12, FOLATE, FERRITIN, TIBC, IRON, RETICCTPCT in the last 72 hours. Urine analysis:    Component Value Date/Time   COLORURINE STRAW (A) 06/01/2020 0909   APPEARANCEUR CLEAR 06/01/2020 0909   LABSPEC 1.003 (L) 06/01/2020 0909   LABSPEC 1.025 03/18/2017 1337   PHURINE 5.0 06/01/2020 0909   GLUCOSEU >=500 (A) 06/01/2020 0909   HGBUR SMALL (A) 06/01/2020 0909   BILIRUBINUR NEGATIVE 06/01/2020 0909   BILIRUBINUR negative 03/18/2017 1337   BILIRUBINUR neg 12/17/2015 0944   KETONESUR NEGATIVE 06/01/2020 0909   PROTEINUR 100 (A) 06/01/2020 0909   UROBILINOGEN negative 12/17/2015 0944   UROBILINOGEN 0.2 12/16/2011 1208   NITRITE NEGATIVE 06/01/2020 0909   LEUKOCYTESUR NEGATIVE 06/01/2020 0909   Sepsis Labs: @LABRCNTIP (procalcitonin:4,lacticidven:4) ) Recent Results (from the past 240 hour(s))  Resp Panel by RT-PCR (Flu A&B, Covid) Nasopharyngeal Swab     Status: None   Collection Time: 08/04/20  2:55 AM   Specimen: Nasopharyngeal Swab; Nasopharyngeal(NP) swabs in vial transport medium  Result Value Ref Range Status   SARS Coronavirus 2 by RT PCR NEGATIVE NEGATIVE Final    Comment: (NOTE) SARS-CoV-2 target nucleic acids are NOT DETECTED.  The SARS-CoV-2 RNA is generally detectable in upper respiratory specimens during the acute phase of infection. The lowest concentration of SARS-CoV-2 viral copies this assay can detect is 138 copies/mL. A negative result does not preclude  SARS-Cov-2 infection and should not be used as the sole basis for treatment or other patient management decisions. A negative result may occur with  improper specimen collection/handling, submission of specimen other than nasopharyngeal swab, presence of viral mutation(s) within the areas targeted by this assay, and inadequate number of viral copies(<138 copies/mL). A negative result must be combined with clinical observations, patient history, and epidemiological information. The expected result is Negative.  Fact Sheet for Patients:  BloggerCourse.com  Fact Sheet for Healthcare Providers:  SeriousBroker.it  This test is no t yet approved or cleared by the [place] FDA and  has been authorized for detection and/or diagnosis of SARS-CoV-2 by FDA under an Emergency Use Authorization (EUA). This EUA will remain  in effect (meaning this test can be used) for the duration of the COVID-19 declaration under Section 564(b)(1) of the Act, 21 U.S.C.section 360bbb-3(b)(1), unless the authorization is terminated  or revoked sooner.       Influenza A by PCR NEGATIVE NEGATIVE Final   Influenza B by PCR NEGATIVE NEGATIVE Final    Comment: (NOTE) The Xpert Xpress SARS-CoV-2/FLU/RSV plus assay is intended as an aid in the diagnosis of influenza from Nasopharyngeal swab specimens and should not be used as a sole basis for treatment. Nasal washings and aspirates are unacceptable for Xpert Xpress SARS-CoV-2/FLU/RSV testing.  Fact Sheet for Patients: BloggerCourse.com  Fact Sheet for Healthcare Providers: SeriousBroker.it  This test is not yet approved or cleared by the [place] FDA and has been authorized for detection and/or diagnosis of SARS-CoV-2 by FDA under an Emergency Use Authorization (EUA). This EUA will remain in effect (meaning this test can be used) for the duration  of  the COVID-19 declaration under Section 564(b)(1) of the Act, 21 U.S.C. section 360bbb-3(b)(1), unless the authorization is terminated or revoked.  Performed at [facility], [address], [place], [place] [address]      Radiological Exams on Admission: DG Chest 2 View  Result Date: 08/09/2020 CLINICAL DATA:  Shortness of breath and cough for 2 weeks. EXAM: CHEST - 2 VIEW COMPARISON:  July 23, 2020 FINDINGS: A single lead ventricular pacer is noted. Mild to moderate severity diffusely increased interstitial lung markings are seen with prominence of the perihilar pulmonary vasculature. There is no evidence of a pleural effusion or pneumothorax. The cardiac silhouette is markedly enlarged. The visualized skeletal structures are unremarkable. IMPRESSION: Stable cardiomegaly with mild to moderate severity pulmonary edema. Electronically Signed   By: Aram Candela M.D.   On: 08/09/2020 19:30    EKG: Independently reviewed.  Atrial flutter rate controlled.  Assessment/Plan Principal Problem:   Acute on chronic combined systolic and diastolic CHF (congestive heart failure) (HCC) Active Problems:   Chronic anticoagulation   Atypical atrial flutter (HCC)   Chronic kidney disease, stage 3b (HCC)   Cellulitis of right leg    Acute on chronic combined systolic and diastolic heart failure last EF measured in December 2021 was 25 to 30%.  At this time patient blood pressure systolic in the low normal at times dropping to the 80s.  I have consulted cardiologist.  May need vasopressor before starting Lasix.  We will continue patient's digoxin Jardiance.  I am holding off patient's Coreg spironolactone and diuretics until seen by cardiologist.  Patient is not in acute distress. Right lower extremity cellulitis placed patient on empiric antibiotics.  I ordered a CT of the right leg to make sure there is no deep abscess.  Blood cultures. History of atrial flutter on amiodarone.  On  Eliquis. 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 History of AICD placement with history of DVT.  Patient is on amiodarone and mexiletine. History of diverticulitis status post colon resection and ileostomy. Chronic and disease stage III creatinine appears to be at baseline.  Patient has acute CHF with cellulitis with low normal blood pressure will need close monitoring for any further worsening inpatient status.   DVT prophylaxis: Apixaban. Code Status: Full code. Family Communication: Discussed with patient. Disposition Plan: Home. Consults called: Cardiology. Admission status: Inpatient.   Eduard Clos MD [facility] Pager [phone].  If 7PM-7AM, please contact night-coverage [email or web address] Password [email or web address]  08/04/2020, 6:02 AM

## 2020-08-04 NOTE — ED Notes (Signed)
IV team at bedside. Will return to start ABX

## 2020-08-04 NOTE — ED Notes (Signed)
Difficulties obtaining blood cultures. Dr. Toniann Fail informed confirmed RN to initiate IV Maxipime. IV vancomycin currently infusion

## 2020-08-04 NOTE — ED Notes (Addendum)
Dr. Toniann Fail at bedside informed of trending low BP. Last reading 89/65. Expressed concerns for Tele bed request

## 2020-08-04 NOTE — ED Notes (Signed)
EMT attempted to obtain lab work without success.

## 2020-08-04 NOTE — ED Provider Notes (Signed)
[facility] EMERGENCY DEPARTMENT Provider Note   CSN: [id number] Arrival date & time: 07/27/2020  1648     History Chief Complaint  Patient presents with   Cellulitis   Shortness of Breath    Roberto [name] is a 55 y.o. male.  The history is provided by the patient.  Shortness of Breath Severity:  Moderate Onset quality:  Gradual Timing:  Intermittent Progression:  Worsening Chronicity:  New Relieved by:  Rest Worsened by:  Activity Associated symptoms: cough and vomiting   Associated symptoms: no abdominal pain, no chest pain and no fever   Patient extensive history including obesity, nonischemic cardiomyopathy, VTE, ventricular tachycardia presents with 2 complaints.  Patient reports increasing cough and shortness of breath over the past several days.  He also reports orthopnea and dyspnea on exertion.  He reports lower extremity edema as well  Patient also reports he recently scraped his right leg.  Over the past several days he has noticed redness and swelling in that leg    Past Medical History:  Diagnosis Date   AICD (automatic cardioverter/defibrillator) present 08/2018   Medtronic ICD   Arthritis    knee   Chronic combined systolic and diastolic CHF (congestive heart failure) (HCC)    Chronic venous insufficiency    Coronary artery disease    Deep vein thrombosis (HCC) 2007   left leg   Diverticulitis    c/b perforated colon and nonhealing abdominal wound s/p skin grafting, ileostomy   Hepatitis    patient denies this dx   Lupus anticoagulant positive    Morbid obesity (HCC)    Morbid obesity with BMI of 50.0-59.9, adult (HCC)    Mural thrombus of heart    coumadin   Nonischemic cardiomyopathy (HCC)    a) 12/17/11 echo: LVEF 25-30%, grade 3 diastolic dysfunction (c/w restriction), mod MR, mod LA/LV and mild RA dilatation; b) 12/18/11 cMRI: LVEF 38%, mod LV/mild RV dilatation, global HK, mild-mod RV sys dysfxn, no LV thrombus & patchy  non-subendocardial delayed enhancement c/w infil dz or prior myocarditis; c. 07/2018 EF 25-30%.   Paroxysmal SVT (supraventricular tachycardia) (HCC)    Pneumonia    x 2   Pulmonary embolism (HCC)    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   Ventricular tachyarrhythmia [facility])    Prior VT/VF   Wears glasses    Wound of abdomen     Patient Active Problem List   Diagnosis Date Noted   Acute on chronic congestive heart failure (HCC) 07/20/2020   Chronic kidney disease, stage 3b (HCC) 07/19/2020   Acute kidney injury superimposed on chronic kidney disease (HCC) 06/10/2020   Chronic systolic CHF (congestive heart failure) (HCC)    Ventricular tachyarrhythmia (HCC) 06/01/2020   Acute on chronic combined systolic and diastolic CHF (congestive heart failure) (HCC) 04/13/2020   S/P implantation of automatic cardioverter/defibrillator (AICD) 04/13/2020   Hx of diverticulitis of colon 12/06/2019   History of colon resection 12/06/2019   History of creation of ostomy (HCC) 12/06/2019   History of colonic polyps 12/06/2019   Hypomagnesemia    History of LV (left ventricular) mural thrombus 09/04/2018   History of DVT (deep vein thrombosis) 09/04/2018   PAF (paroxysmal atrial fibrillation) (HCC) 09/04/2018   Gout 05/06/2018   Atypical atrial flutter (HCC) 03/17/2017   Chronic pain of right knee 03/13/2017   Typical atrial flutter s/p catheter ablation    Varicose veins of legs 12/17/2015   Encounter for health maintenance examination in  adult 12/17/2015   Special screening for malignant neoplasms, colon 12/17/2015   Vaccine counseling 12/17/2015   Knee deformity, acquired, right 12/17/2015   Gait disturbance 12/17/2015   Osteoarthritis of right knee 12/17/2015   Varicose veins of lower extremities with ulcer (HCC) 09/18/2015   Need for prophylactic vaccination and inoculation against influenza 09/18/2015   Tinea versicolor 06/15/2015   Impaired fasting blood sugar 12/28/2014   Chronic  anticoagulation 12/28/2014   Erectile dysfunction 02/19/2012   Chronic systolic heart failure (HCC) 12/29/2011   Hypokalemia 12/24/2011   Lupus anticoagulant positive 12/22/2011   Nonischemic cardiomyopathy (HCC) 12/16/2011   Paroxysmal SVT (supraventricular tachycardia) (HCC) 12/16/2011   History of venous thromboembolism 12/16/2011   Venous insufficiency 11/04/2011   Morbid obesity with BMI of 50.0-59.9, adult (HCC) 06/02/2008    Past Surgical History:  Procedure Laterality Date   A-FLUTTER ABLATION N/A 11/17/2016   Procedure: A-FLUTTER ABLATION;  Surgeon: Regan Lemmingamnitz, Will Martin, MD;  Location: [facility] INVASIVE CV LAB;  Service: Cardiovascular;  Laterality: N/A;   APPLICATION OF WOUND VAC N/A 09/05/2019   Procedure: APPLICATION OF WOUND VAC;  Surgeon: Allena NapoleonPace, Collier S, MD;  Location: [facility] OR;  Service: Plastics;  Laterality: N/A;  PATIENT'S HOME WOUND VAC APPLIED.   BIOPSY  12/23/2018   Procedure: BIOPSY;  Surgeon: Meridee ScoreMansouraty, Netty StarringGabriel Jr., MD;  [facility];  Service: Gastroenterology;;   BIOPSY  01/23/2020   Procedure: BIOPSY;  Surgeon: Lemar LoftyMansouraty, Gabriel Jr., MD;  [facility];  Service: Gastroenterology;;   CARDIAC CATHETERIZATION  06/2006   Angiographically normal cors   CARDIAC CATHETERIZATION  12/22/2011   R/LHC: normal cors, well-compensated HDs, LV dysfxn   CARDIOVERSION N/A 10/17/2016   Procedure: CARDIOVERSION;  Surgeon: Dolores PattyBensimhon, Daniel R, MD;  [facility];  Service: Cardiovascular;  Laterality: N/A;   CARDIOVERSION N/A 03/20/2017   Procedure: CARDIOVERSION;  Surgeon: Dolores PattyBensimhon, Daniel R, MD;  [facility];  Service: Cardiovascular;  Laterality: N/A;   COLECTOMY N/A 12/28/2018   Procedure: TOTAL COLECTOMY;  Surgeon: Emelia LoronWakefield, Matthew, MD;  [facility];  Service: General;  Laterality: N/A;   FLEXIBLE SIGMOIDOSCOPY N/A 12/23/2018   Procedure: FLEXIBLE SIGMOIDOSCOPY;  Surgeon: Lemar LoftyMansouraty, Gabriel Jr., MD;  [facility];  Service:  Gastroenterology;  Laterality: N/A;   FLEXIBLE SIGMOIDOSCOPY N/A 01/23/2020   Procedure: FLEXIBLE SIGMOIDOSCOPY;  Surgeon: Meridee ScoreMansouraty, Netty StarringGabriel Jr., MD;  [facility];  Service: Gastroenterology;  Laterality: N/A;   ICD IMPLANT N/A 09/03/2018   Procedure: ICD Medtronic IMPLANT;  Surgeon: Regan Lemmingamnitz, Will Martin, MD;  [facility];  Service: Cardiovascular;  Laterality: N/A;   ILEOSTOMY N/A 12/28/2018   Procedure: ILEOSTOMY;  Surgeon: Emelia LoronWakefield, Matthew, MD;  [facility];  Service: General;  Laterality: N/A;   IR RADIOLOGIST EVAL & MGMT  02/08/2019   JOINT REPLACEMENT     right knee   LEFT AND RIGHT HEART CATHETERIZATION WITH CORONARY ANGIOGRAM N/A 12/22/2011   Procedure: LEFT AND RIGHT HEART CATHETERIZATION WITH CORONARY ANGIOGRAM;  Surgeon: Dolores Pattyaniel R Bensimhon, MD;  [facility];  Service: Cardiovascular;  Laterality: N/A;   PATELLAR TENDON REPAIR     Left   RIGHT HEART CATH N/A 10/20/2016   Procedure: RIGHT HEART CATH;  Surgeon: Dolores PattyBensimhon, Daniel R, MD;  [facility]: [facility] IN[facility];  Service: Cardiovascular;  Laterality: N/A;   RIGHT HEART CATH N/A 04/17/2020   Procedure: RIGHT HEART CATH;  Surgeon: Dolores PattyBensimhon, Daniel R, MD;  [facility]: [facility] IN[facility] LAB;  Service: Cardiovascular;  Laterality: N/A;   SKIN SPLIT  GRAFT N/A 09/05/2019   Procedure: SKIN GRAFT SPLIT THICKNESS;  Surgeon: Allena Napoleon, MD;  Location: [facility] OR;  Service: Plastics;  Laterality: N/A;   TEE WITHOUT CARDIOVERSION N/A 03/20/2017   Procedure: TRANSESOPHAGEAL ECHOCARDIOGRAM (TEE);  Surgeon: Dolores Patty, MD;  Location: [facility] ENDOSCOPY;  Service: Cardiovascular;  Laterality: N/A;       Family History  Problem Relation Age of Onset   Hypertension Mother    Clotting disorder Mother        mom with PEs   Cancer Mother        uterine cancer   Diabetes Father    Heart attack Father    Hypertension Other        Sibling   Diabetes Other    Obesity Other    Diabetes Sister    Obesity  Sister    Obesity Sister    Colon cancer Neg Hx    Stomach cancer Neg Hx    Esophageal cancer Neg Hx    Rectal cancer Neg Hx    Inflammatory bowel disease Neg Hx    Liver disease Neg Hx    Pancreatic cancer Neg Hx     Social History   Tobacco Use   Smoking status: Never   Smokeless tobacco: Never  Vaping Use   Vaping Use: Never used  Substance Use Topics   Alcohol use: Not Currently    Comment: Rarely   Drug use: No    Home Medications Prior to Admission medications   Medication Sig Start Date End Date Taking? Authorizing Provider  amiodarone (PACERONE) 200 MG tablet Take 200 mg by mouth 2 (two) times daily.    [provider]  benzonatate (TESSALON PERLES) 100 MG capsule Take 1 capsule (100 mg total) by mouth 3 (three) times daily as needed for cough. 07/27/20   Bensimhon, Bevelyn Buckles, MD  carvedilol (COREG) 6.25 MG tablet Take 1 tablet (6.25 mg total) by mouth 2 (two) times daily with a meal. 06/10/20   Duke Salvia, MD  dapagliflozin propanediol (FARXIGA) 10 MG TABS tablet Take 1 tablet (10 mg total) by mouth daily. 05/29/20   Laurey Morale, MD  digoxin (LANOXIN) 0.125 MG tablet Take 0.5 tablets (0.0625 mg total) by mouth daily. 06/22/20   Bensimhon, Bevelyn Buckles, MD  ELIQUIS 5 MG TABS tablet TAKE 1 TABLET(5 MG) BY MOUTH TWICE DAILY 04/27/20   Robbie Lis M, PA-C  mexiletine (MEXITIL) 150 MG capsule Take 2 capsules (300 mg total) by mouth every 12 (twelve) hours. 06/10/20   Duke Salvia, MD  pantoprazole (PROTONIX) 40 MG tablet Take 1 tablet (40 mg total) by mouth daily. 06/22/20   Bensimhon, Bevelyn Buckles, MD  potassium chloride SA (KLOR-CON) 20 MEQ tablet Take 2 tablets (40 mEq total) by mouth daily. 07/11/20   Bensimhon, Bevelyn Buckles, MD  ranolazine (RANEXA) 500 MG 12 hr tablet Take 1 tablet (500 mg total) by mouth 2 (two) times daily. 06/10/20   Duke Salvia, MD  spironolactone (ALDACTONE) 25 MG tablet Take 2 tablets (50 mg total) by mouth daily. 06/10/20   Duke Salvia, MD  Torsemide 60 MG TABS Take 60 mg by mouth 2 (two) times daily.    [provider]    Allergies    Shrimp [shellfish allergy]  Review of Systems   Review of Systems  Constitutional:  Negative for fever.  Respiratory:  Positive for cough and shortness of breath.   Cardiovascular:  Positive for leg swelling. Negative  for chest pain.  Gastrointestinal:  Positive for vomiting. Negative for abdominal pain.  Skin:  Positive for wound.  All other systems reviewed and are negative.  Physical Exam Updated Vital Signs BP 98/62   Pulse 77   Temp 98.2 F (36.8 C) (Oral)   Resp (!) 29   Ht 1.727 m (5\' 8" )   Wt (!) 154 kg   SpO2 97%   BMI 51.64 kg/m   Physical Exam CONSTITUTIONAL: Chronically ill-appearing HEAD: Normocephalic/atraumatic EYES: EOMI/PERRL ENMT: Mucous membranes moist NECK: supple no meningeal signs SPINE/BACK:entire spine nontender CV: S1/S2 noted, distant heart sound LUNGS: Crackles noted, coughs frequently during exam ABDOMEN: soft, nontender, no rebound or guarding, bowel sounds noted throughout abdomen, colostomy in place.  Patient is obese GU:no cva tenderness NEURO: Pt is awake/alert/appropriate, moves all extremitiesx4.  No facial droop.   EXTREMITIES: pulses normal/equal, full ROM Significant/symmetric pitting edema to bilateral lower extremities.  Distal pulses intact.  Obvious cellulitis noted to the right leg.  No crepitus noted to the right leg SKIN: warm, color normal, cellulitis to right leg, see photo below PSYCH: no abnormalities of mood noted, alert and oriented to situation    ED Results / Procedures / Treatments   Labs (all labs ordered are listed, but only abnormal results are displayed) Labs Reviewed  BASIC METABOLIC PANEL - Abnormal; Notable for the following components:      Result Value   Glucose, Bld 135 (*)    BUN 32 (*)    Creatinine, Ser 2.72 (*)    Calcium 8.6 (*)    GFR, Estimated 27 (*)    All other components  within normal limits  CBC WITH DIFFERENTIAL/PLATELET - Abnormal; Notable for the following components:   WBC 15.0 (*)    nRBC 0.6 (*)    Neutro Abs 11.9 (*)    Monocytes Absolute 1.5 (*)    Abs Immature Granulocytes 0.29 (*)    All other components within normal limits  BRAIN NATRIURETIC PEPTIDE - Abnormal; Notable for the following components:   B Natriuretic Peptide 1,426.8 (*)    All other components within normal limits  RESP PANEL BY RT-PCR (FLU A&B, COVID) [id number]  DIGOXIN LEVEL    EKG EKG Interpretation  Date/Time:  Saturday August 04 2020 02:46:49 EDT Ventricular Rate:  122 PR Interval:  131 QRS Duration: 138 QT Interval:  445 QTC Calculation: 635 R Axis:   176 Text Interpretation: Sinus tachycardia Multiple premature complexes, vent & supraven Aberrant conduction of SV complex(es) Nonspecific intraventricular conduction delay Abnormal lateral Q waves Anterior infarct, old Minimal ST depression Confirmed by 02-06-1994 (Zadie Rhine) on 08/04/2020 2:59:17 AM  Radiology DG Chest 2 View  Result Date: 2020/08/09 CLINICAL DATA:  Shortness of breath and cough for 2 weeks. EXAM: CHEST - 2 VIEW COMPARISON:  July 23, 2020 FINDINGS: A single lead ventricular pacer is noted. Mild to moderate severity diffusely increased interstitial lung markings are seen with prominence of the perihilar pulmonary vasculature. There is no evidence of a pleural effusion or pneumothorax. The cardiac silhouette is markedly enlarged. The visualized skeletal structures are unremarkable. IMPRESSION: Stable cardiomegaly with mild to moderate severity pulmonary edema. Electronically Signed   By: July 25, 2020 M.D.   On: August 09, 2020 19:30    Procedures Procedures   Medications Ordered in ED Medications  furosemide (LASIX) injection 40 mg (has no administration in time range)  vancomycin (VANCOCIN) 2,500 mg in sodium chloride 0.9 % 500 mL IVPB (has no administration in time range)  ED Course  I have  reviewed the triage vital signs and the nursing notes.  Pertinent labs & imaging results that were available during my care of the patient were reviewed by me and considered in my medical decision making (see chart for details).    MDM Rules/Calculators/A&P                           Patient with extensive cardiac history presents with increasing cough and shortness of breath.  X-ray and labs are consistent with acute on chronic congestive heart failure.  Patient also has evidence of cellulitis in the right leg after he recently scraped the leg.  Patient was sent over by his heart failure specialist for admission. 4:25 AM Discussed with Dr. Toniann Fail for admission. Manual blood pressure 98/60.  His blood pressures have ranged in the upper 90s to low 100s.  This is likely related to his CHF rather than sepsis.  He is afebrile. Will hold Lasix for now Final Clinical Impression(s) / ED Diagnoses Final diagnoses:  Cellulitis of right leg  Acute systolic congestive heart failure (HCC)  AKI (acute kidney injury) (HCC)    Rx / DC Orders ED Discharge Orders     None        Zadie Rhine, MD 08/04/20 0425

## 2020-08-04 NOTE — Progress Notes (Addendum)
Pharmacy Antibiotic Note  Roberto [name] is a 55 y.o. male admitted on 07/28/2020 with cellulitis.  Pharmacy has been consulted for vancomycin and cefepime dosing.  Plan: Vancomycin 2500mg  x1 then 2000mg  IV Q24H. Goal AUC 400-550.  Expected AUC 440.  SCr 2.72.  Cefepime 2g IV Q12H.  Height: 5\' 8"  (172.7 cm) Weight: (!) 154 kg (339 lb 9.6 oz) IBW/kg (Calculated) : 68.4  Temp (24hrs), Avg:98.5 F (36.9 C), Min:98.2 F (36.8 C), Max:98.9 F (37.2 C)  Recent Labs  Lab 08/01/2020 1707  WBC 15.0*  CREATININE 2.72*    Estimated Creatinine Clearance: 45.1 mL/min (A) (by C-G formula based on SCr of 2.72 mg/dL (H)).    Allergies  Allergen Reactions   Shrimp [Shellfish Allergy] Hives and Itching     Thank you for allowing pharmacy to be a part of this patient's care.  , PharmD, BCPS  08/04/2020 6:08 AM

## 2020-08-04 NOTE — Progress Notes (Signed)
Pt arrived from ED to 4E24 pt vital signs obtained and CHG bath completed. Patient with B leg edema, rt leg more swollen than left, red and warm and weeping from wound on shin area. Call bell within reach. Brianne Maina, Randall An rN

## 2020-08-04 NOTE — ED Notes (Signed)
Secure message sent to Dr. Toniann Fail concerning pt's trending BP. Current BP 84/55 MAP 64. Per MD paged cardiology for pt evaluation and possible pressor initiation. No BP parameters at this time. Pending response

## 2020-08-04 NOTE — Plan of Care (Signed)
  Problem: Education: Goal: Knowledge of General Education information will improve Description: Including pain rating scale, medication(s)/side effects and non-pharmacologic comfort measures Outcome: Progressing   Problem: Clinical Measurements: Goal: Ability to maintain clinical measurements within normal limits will improve Outcome: Progressing Goal: Will remain free from infection Outcome: Progressing Goal: Diagnostic test results will improve Outcome: Progressing Goal: Respiratory complications will improve Outcome: Progressing Goal: Cardiovascular complication will be avoided Outcome: Progressing   Problem: Activity: Goal: Risk for activity intolerance will decrease Outcome: Progressing   Problem: Nutrition: Goal: Adequate nutrition will be maintained Outcome: Progressing   Problem: Coping: Goal: Level of anxiety will decrease Outcome: Progressing   Problem: Elimination: Goal: Will not experience complications related to bowel motility Outcome: Progressing Goal: Will not experience complications related to urinary retention Outcome: Progressing   Problem: Pain Managment: Goal: General experience of comfort will improve Outcome: Progressing   Problem: Safety: Goal: Ability to remain free from injury will improve Outcome: Progressing   Problem: Skin Integrity: Goal: Risk for impaired skin integrity will decrease Outcome: Progressing   Problem: Education: Goal: Ability to demonstrate management of disease process will improve Outcome: Progressing Goal: Ability to verbalize understanding of medication therapies will improve Outcome: Progressing Goal: Individualized Educational Video(s) Outcome: Progressing   Problem: Activity: Goal: Capacity to carry out activities will improve Outcome: Progressing   Problem: Cardiac: Goal: Ability to achieve and maintain adequate cardiopulmonary perfusion will improve Outcome: Progressing

## 2020-08-04 NOTE — Consult Note (Addendum)
Cardiology Consultation:   Patient ID: Roberto [name] MRN: [id number]; DOB: 11/19/1965  Admit date: 08/15/20 Date of Consult: 08/04/2020  PCP:  [name], MD   [facility] Providers Cardiologist:  Arvilla Meres, MD  Electrophysiologist:  Regan Lemming, MD       Patient Profile:   Roberto [name] is a 55 y.o. 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 Toniann Fail.  History of Present Illness:   Roberto [name] was seen outpatient by Dorena Cookey, NP with AHF clinic August 15, 2020 for post-hospital follow-up. He was recently admitted to [facility] 07/19/20-07/21/20 for acute on chronic combined CHF. He was diuresed with IV lasix 80mg  BID and ultimately transitioned to torsemide 60mg  BID at discharge. At his follow-up visit 2020-08-15 he had complaints of N/V x2 weeks, dry cough, and recent injury to right leg 2 days prior. His leg was c/f cellulitis and he was directed tot he ED for IV antibiotics and diuresis.   He has a longstanding history of NICM, initially felt to be tachycardia mediated. He was reported to have had a LHC in 2013 which showed no CAD. EF has been as low as 15% in 2018 with most recent echocardiogram 12/2019 with EF 25-30%, mild LVH, G3DD, normal RV size/function, and no significant valvular abnormalities. He was hospitalized 05/2020 for VT with recurrent episodes during that admission. He was managed with amiodarone and mexiletine. His last device interrogation 07/19/20 showed no recurrent VT. He was seen by palliative care during his \\admission  05/2020 to establish GOC as he is nearing end-stage CHF and not a candidate for advanced therapies due to size, RV dysfunction, CKD, and ileostomy, though he wished to remain full code.    Hospital course: BP soft but stable, intermittently tachypneic, otherwise VSS. Labs notable for electrolytes wnl, Cr 2.72 (baseline around ), WBC 15.0, Hgb 15.1, PLT 237, BNP 1426, Lactate 3.6, dig level 0.4, COVID-19/influenza negative. CXR with mild-moderate pulmonary edema. EKG with sinus rhythm with 1st degree AV block, rate 79 bpm, non-specific IVCD, no STE/D, no significant change from previous. CT right lower leg without osteomyelitis, abscess, or soft tissue emphysema. He was started on IV antibiotics and admitted to medicine. Home carvedilol, spironolactone, and torsemide held given soft blood pressures. Cardiology asked to evaluate.   At the time of this evaluation he states he is feeling okay. Coughing started several weeks ago and has been dry and nagging. He has had some DOE and worsening LE edema in the past several days. Has chronic orthopnea but no significant change or complaints of PND. He denies chest pain, palpitations, bleeding, ICD shocks. He is asking for something to eat and ostomy supplies at this time - RN updated to requests.    Past Medical History:  Diagnosis Date   AICD (automatic cardioverter/defibrillator) present 08/2018   Medtronic ICD   Arthritis    knee   Chronic combined systolic and diastolic CHF (congestive heart failure) (HCC)    Chronic venous insufficiency    Coronary artery disease    Deep vein thrombosis (HCC) 2007   left leg   Diverticulitis    c/b perforated colon and nonhealing abdominal wound s/p skin grafting, ileostomy   Hepatitis    patient denies this dx  Lupus anticoagulant positive    Morbid obesity (HCC)    Morbid obesity with BMI of 50.0-59.9, adult (HCC)    Mural thrombus of heart    coumadin   Nonischemic cardiomyopathy (HCC)    a) 12/17/11 echo: LVEF 25-30%, grade 3 diastolic dysfunction (c/w restriction), mod MR, mod LA/LV and mild RA dilatation; b) 12/18/11 cMRI: LVEF 38%, mod LV/mild RV dilatation, global HK, mild-mod RV sys  dysfxn, no LV thrombus & patchy non-subendocardial delayed enhancement c/w infil dz or prior myocarditis; c. 07/2018 EF 25-30%.   Paroxysmal SVT (supraventricular tachycardia) (HCC)    Pneumonia    x 2   Pulmonary embolism (HCC)    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   Ventricular tachyarrhythmia [facility])    Prior VT/VF   Wears glasses    Wound of abdomen     Past Surgical History:  Procedure Laterality Date   A-FLUTTER ABLATION N/A 11/17/2016   Procedure: A-FLUTTER ABLATION;  Surgeon: Regan Lemming, MD;  Location: [facility] INVASIVE CV LAB;  Service: Cardiovascular;  Laterality: N/A;   APPLICATION OF WOUND VAC N/A 09/05/2019   Procedure: APPLICATION OF WOUND VAC;  Surgeon: Allena Napoleon, MD;  Location: [facility] OR;  Service: Plastics;  Laterality: N/A;  PATIENT'S HOME WOUND VAC APPLIED.   BIOPSY  12/23/2018   Procedure: BIOPSY;  Surgeon: Meridee Score Netty Starring., MD;  Location: [facility] ENDOSCOPY;  Service: Gastroenterology;;   BIOPSY  01/23/2020   Procedure: BIOPSY;  Surgeon: Lemar Lofty., MD;  Location: [facility] ENDOSCOPY;  Service: Gastroenterology;;   CARDIAC CATHETERIZATION  06/2006   Angiographically normal cors   CARDIAC CATHETERIZATION  12/22/2011   R/LHC: normal cors, well-compensated HDs, LV dysfxn   CARDIOVERSION N/A 10/17/2016   Procedure: CARDIOVERSION;  Surgeon: Dolores Patty, MD;  Location: [facility] ENDOSCOPY;  Service: Cardiovascular;  Laterality: N/A;   CARDIOVERSION N/A 03/20/2017   Procedure: CARDIOVERSION;  Surgeon: Dolores Patty, MD;  Location: [facility] ENDOSCOPY;  Service: Cardiovascular;  Laterality: N/A;   COLECTOMY N/A 12/28/2018   Procedure: TOTAL COLECTOMY;  Surgeon: Emelia Loron, MD;  Location: [facility] OR;  Service: General;  Laterality: N/A;   FLEXIBLE SIGMOIDOSCOPY N/A 12/23/2018   Procedure: FLEXIBLE SIGMOIDOSCOPY;  Surgeon: Lemar Lofty., MD;  Location: [facility] ENDOSCOPY;  Service: Gastroenterology;  Laterality: N/A;   FLEXIBLE SIGMOIDOSCOPY N/A 01/23/2020    Procedure: FLEXIBLE SIGMOIDOSCOPY;  Surgeon: Meridee Score Netty Starring., MD;  Location: [facility] ENDOSCOPY;  Service: Gastroenterology;  Laterality: N/A;   ICD IMPLANT N/A 09/03/2018   Procedure: ICD Medtronic IMPLANT;  Surgeon: Regan Lemming, MD;  Location: [facility] INVASIVE CV LAB;  Service: Cardiovascular;  Laterality: N/A;   ILEOSTOMY N/A 12/28/2018   Procedure: ILEOSTOMY;  Surgeon: Emelia Loron, MD;  Location: [facility] OR;  Service: General;  Laterality: N/A;   IR RADIOLOGIST EVAL & MGMT  02/08/2019   JOINT REPLACEMENT     right knee   LEFT AND RIGHT HEART CATHETERIZATION WITH CORONARY ANGIOGRAM N/A 12/22/2011   Procedure: LEFT AND RIGHT HEART CATHETERIZATION WITH CORONARY ANGIOGRAM;  Surgeon: Dolores Patty, MD;  Location: [facility] CATH LAB;  Service: Cardiovascular;  Laterality: N/A;   PATELLAR TENDON REPAIR     Left   RIGHT HEART CATH N/A 10/20/2016   Procedure: RIGHT HEART CATH;  Surgeon: Dolores Patty, MD;  Location: [facility] INVASIVE CV LAB;  Service: Cardiovascular;  Laterality: N/A;   RIGHT HEART CATH N/A 04/17/2020   Procedure: RIGHT HEART CATH;  Surgeon: Dolores Patty, MD;  Location: [facility] INVASIVE CV LAB;  Service: Cardiovascular;  Laterality: N/A;   SKIN SPLIT GRAFT N/A 09/05/2019   Procedure: SKIN GRAFT SPLIT THICKNESS;  Surgeon: Allena Napoleon, MD;  Location: [facility] OR;  Service: Plastics;  Laterality: N/A;   TEE WITHOUT CARDIOVERSION N/A 03/20/2017   Procedure: TRANSESOPHAGEAL ECHOCARDIOGRAM (TEE);  Surgeon: Dolores Patty, MD;  Location: [facility] ENDOSCOPY;  Service: Cardiovascular;  Laterality: N/A;     Home Medications:  Prior to Admission medications   Medication Sig Start Date End Date Taking? Authorizing Provider  amiodarone (PACERONE) 200 MG tablet Take 200 mg by mouth 2 (two) times daily.   Yes [provider]  benzonatate (TESSALON PERLES) 100 MG capsule Take 1 capsule (100 mg total) by mouth 3 (three) times daily as needed for cough. 07/27/20  Yes Joah Patlan, Bevelyn Buckles, MD   carvedilol (COREG) 6.25 MG tablet Take 1 tablet (6.25 mg total) by mouth 2 (two) times daily with a meal. 06/10/20  Yes Duke Salvia, MD  dapagliflozin propanediol (FARXIGA) 10 MG TABS tablet Take 1 tablet (10 mg total) by mouth daily. 05/29/20  Yes Laurey Morale, MD  digoxin (LANOXIN) 0.125 MG tablet Take 0.5 tablets (0.0625 mg total) by mouth daily. 06/22/20  Yes Zoriana Oats, Bevelyn Buckles, MD  ELIQUIS 5 MG TABS tablet TAKE 1 TABLET(5 MG) BY MOUTH TWICE DAILY Patient taking differently: Take 5 mg by mouth 2 (two) times daily. 04/27/20  Yes Robbie Lis M, PA-C  mexiletine (MEXITIL) 150 MG capsule Take 2 capsules (300 mg total) by mouth every 12 (twelve) hours. 06/10/20  Yes Duke Salvia, MD  pantoprazole (PROTONIX) 40 MG tablet Take 1 tablet (40 mg total) by mouth daily. 06/22/20  Yes Giovanni Biby, Bevelyn Buckles, MD  potassium chloride SA (KLOR-CON) 20 MEQ tablet Take 2 tablets (40 mEq total) by mouth daily. 07/11/20  Yes Othman Masur, Bevelyn Buckles, MD  ranolazine (RANEXA) 500 MG 12 hr tablet Take 1 tablet (500 mg total) by mouth 2 (two) times daily. 06/10/20  Yes Duke Salvia, MD  spironolactone (ALDACTONE) 25 MG tablet Take 2 tablets (50 mg total) by mouth daily. 06/10/20  Yes Duke Salvia, MD  Torsemide 60 MG TABS Take 60 mg by mouth 2 (two) times daily.   Yes [provider]    Inpatient Medications: Scheduled Meds:  amiodarone  200 mg Oral BID   apixaban  5 mg Oral BID   dapagliflozin propanediol  10 mg Oral Daily   digoxin  0.0625 mg Oral Daily   mexiletine  300 mg Oral Q12H   pantoprazole  40 mg Oral Daily   ranolazine  500 mg Oral BID   Continuous Infusions:  ceFEPime (MAXIPIME) IV     [START ON 08/06/2020] vancomycin     PRN Meds: acetaminophen **OR** acetaminophen  Allergies:    Allergies  Allergen Reactions   Shrimp [Shellfish Allergy] Hives and Itching    Social History:   Social History   Socioeconomic History   Marital status: Divorced    Spouse name: Not on  file   Number of children: 5   Years of education: Not on file   Highest education level: Not on file  Occupational History   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[employer]    Employer: [employer]  Tobacco Use   Smoking status: Never   Smokeless tobacco: Never  Vaping Use   Vaping Use: Never used  Substance and Sexual Activity   Alcohol use: Not Currently    Comment: Rarely  Drug use: No   Sexual activity: Not on file  Other Topics Concern   Not on file  Social History Narrative   Lives in [place] 03/2015, 5 children.   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  Exercises with weights and some walking. 12/2015   Social Determinants of Health   Financial Resource Strain: Low Risk    Difficulty of Paying Living Expenses: Not very hard  Food Insecurity: No Food Insecurity   Worried About Programme researcher, broadcasting/film/video in the Last Year: Never true   Ran Out of Food in the Last Year: Never true  Transportation Needs: No Transportation Needs   Lack of Transportation (Medical): No   Lack of Transportation (Non-Medical): No  Physical Activity: Not on file  Stress: Not on file  Social Connections: Not on file  Intimate Partner Violence: Not on file    Family History:    Family History  Problem Relation Age of Onset   Hypertension Mother    Clotting disorder Mother        mom with PEs   Cancer Mother        uterine cancer   Diabetes Father    Heart attack Father    Hypertension Other        Sibling   Diabetes Other    Obesity Other    Diabetes Sister    Obesity Sister    Obesity Sister    Colon cancer Neg Hx    Stomach cancer Neg Hx    Esophageal cancer Neg Hx    Rectal cancer Neg Hx    Inflammatory bowel disease Neg Hx    Liver disease Neg Hx    Pancreatic cancer Neg Hx      ROS:  Please see the history of present illness.   All other ROS reviewed and negative.     Physical Exam/Data:   Vitals:   08/04/20  0515 08/04/20 0545 08/04/20 0615 08/04/20 0646  BP: (!) 89/65 91/68 (!) 84/55 (!) 83/57  Pulse: 78 80 77 80  Resp: (!) 24 (!) 25 (!) 31 19  Temp:      TempSrc:      SpO2: 96% 98% 96% 92%  Weight:      Height:        Intake/Output Summary (Last 24 hours) at 08/04/2020 0745 Last data filed at 08/04/2020 0534 Gross per 24 hour  Intake --  Output 860 ml  Net -860 ml   Last 3 Weights 08/12/2020 08/07/2020 07/21/2020  Weight (lbs) 339 lb 9.6 oz 339 lb 9.6 oz 334 lb 1.6 oz  Weight (kg) 154.042 kg 154.042 kg 151.547 kg     Body mass index is 51.64 kg/m.  General:  Morbidly obese gentleman laying in bed with mild SOB though overall in no acute distress HEENT: sclera anicteric Neck: JVP to jaw Endocrine:  No thryomegaly Vascular: No carotid bruits Cardiac:  normal S1, S2; RRR; no murmurs, rubs, or gallops Lungs:  crackles at lung bases, otherwise no wheezing/rhonchi Abd: soft, obese, nontender, +ileostomy Ext: 3+ LE edema Musculoskeletal:  No deformities, BUE and BLE strength normal and equal Skin: warm and dry; RLE with erythema and weeping clear fluid with small skin tear to anterior tibia  Neuro:  CNs 2-12 intact, no focal abnormalities noted Psych:  Normal affect   EKG:  The EKG was personally reviewed and demonstrates:  sinus rhythm with 1st degree AV block, rate 79 bpm, non-specific IVCD, no  STE/D, no significant change from previous. Telemetry:  Telemetry was personally reviewed and demonstrates:  sinus rhythm with occasional PVCs  Relevant CV Studies: Echocardiogram 12/2019 1. Left ventricular ejection fraction, by estimation, is 25 to 30%. The  left ventricle has severely decreased function. Left ventricular  endocardial border not optimally defined to evaluate regional wall motion.  The left ventricular internal cavity size   was moderately dilated. There is mild left ventricular hypertrophy. Left  ventricular diastolic parameters are consistent with Grade III diastolic   dysfunction (restrictive).   2. Right ventricular systolic function is normal. The right ventricular  size is normal.   3. The mitral valve is normal in structure. No evidence of mitral valve  regurgitation. No evidence of mitral stenosis.   4. The aortic valve is normal in structure. Aortic valve regurgitation is  not visualized. No aortic stenosis is present.   5. The inferior vena cava is normal in size with greater than 50%  respiratory variability, suggesting right atrial pressure of 3 mmHg.   Comparison(s): No significant change from prior study. Prior images  reviewed side by side.  RHC 04/2020: Assessment: 1. Low filling pressures 2. Normal cardiac output   Plan/Discussion:   He is dry. Hold diuretics.  Laboratory Data:  High Sensitivity Troponin:   Recent Labs  Lab 07/19/20 0222  TROPONINIHS 46*     Chemistry Recent Labs  Lab 08/01/2020 1707  NA 136  K 4.2  CL 101  CO2 24  GLUCOSE 135*  BUN 32*  CREATININE 2.72*  CALCIUM 8.6*  GFRNONAA 27*  ANIONGAP 11    No results for input(s): PROT, ALBUMIN, AST, ALT, ALKPHOS, BILITOT in the last 168 hours. Hematology Recent Labs  Lab 08/09/2020 1707  WBC 15.0*  RBC 5.02  HGB 15.1  HCT 48.3  MCV 96.2  MCH 30.1  MCHC 31.3  RDW 15.5  PLT 237   BNP Recent Labs  Lab 07/22/2020 1707  BNP 1,426.8*    DDimer No results for input(s): DDIMER in the last 168 hours.   Radiology/Studies:  DG Chest 2 View  Result Date: 08/05/2020 CLINICAL DATA:  Shortness of breath and cough for 2 weeks. EXAM: CHEST - 2 VIEW COMPARISON:  July 23, 2020 FINDINGS: A single lead ventricular pacer is noted. Mild to moderate severity diffusely increased interstitial lung markings are seen with prominence of the perihilar pulmonary vasculature. There is no evidence of a pleural effusion or pneumothorax. The cardiac silhouette is markedly enlarged. The visualized skeletal structures are unremarkable. IMPRESSION: Stable cardiomegaly with mild  to moderate severity pulmonary edema. Electronically Signed   By: Aram Candela M.D.   On: 07/15/2020 19:30     Assessment and Plan:   1. Acute on chronic combined CHF: patient sent from office visit 08/07/2020 with c/f cellulitis, as well as acute on chronic combined CHF. He was noted to have LE edema but no orthopnea, PND. BNP is elevated to 1400, up from 900s earlier this month and significantly above baseline. CXR with mild-moderate pulmonary edema. Lactate elevated to 3.6. He has not yet received diuretics this admission due.  - Will challenge with IV lasix 80mg  BID for now - if UOP unimpressive, may need to place PICC line and obtain coox - Continue to monitor strict I&Os and daily weights - Continue to monitor electrolytes closely and replete as needed to maintain K >4, Mg >2 - Can hold carvedilol and spironolactone in the setting of decompensated CHF and AKI - Continue digoxin and  farxiga  2. RLE cellulitis: patient injured his leg 2 days ago resulting in significant erythema and weeping. No evidence of osteomyelitis or abscess on CT. Started on IV cefepime and vancomycin for cellulitis.  - Continue management per primary team  3. History of VT: admission 05/2020 for VT with recurrence during admission, felt to be driving by electrolyte imbalance. Seen by EP and started on amiodarone and mexiletine with no evidence of recurrence on last interrogation 07/19/20. Patient denies ICD shocks - Continue amiodarone and mexiletine - Continue to monitor on telemetry - Continue to monitor electrolytes closely and replete as needed to maintain K >4, Mg >2  4. History of LV thrombus: on chronic eliquis without complaints of bleeding - Continue eliquis  5. Atrial flutter s/p ablation: maintaining sinus rhythm. - Continue eliquis for stroke ppx - Continue amiodarone for rhythm control  6. CKD stage 3b: Cr 2.7 on admission with variable baseline 1.8-2.5.  - Continue to monitor closely with plans  for diuresis as above  7. Diverticulitis s/p ileostomy: asking for supplies - RN to provide ostomy supplies     Risk Assessment/Risk Scores:   New York Heart Association (NYHA) Functional Class NYHA Class IV  CHA2DS2-VASc Score = 4  This indicates a 4.8% annual risk of stroke. [name] score is based upon: CHF History: Yes HTN History: No Diabetes History: No Stroke History: Yes (recurrent VTE) Vascular Disease History: Yes Age Score: 0 Gender Score: 0      For questions or updates, please contact [facility] Please consult [email or web address] for contact info under    Signed, Beatriz StallionKrista M. Kroeger, PA-C  08/04/2020 7:45 AM   Patient seen and examined with the above-signed Advanced Practice Provider and/or Housestaff. I personally reviewed laboratory data, imaging studies and relevant notes. I independently examined the patient and formulated the important aspects of the plan. I have edited the note to reflect any of my changes or salient points. I have personally discussed the plan with the patient and/or family.  Remains SOB. RLE very angry. WBC 15k  Lactate 3.6. CT RLE no osteo. SCR 2.2 -> 2.7 CXR with pulmonary edema. Has been started on IV abx   General:  Ill appearing mild dyspnea HEENT: normal Neck: supple. JVP to jaw Carotids 2+ bilat; no bruits. No lymphadenopathy or thryomegaly appreciated. Cor: PMI nondisplaced. Regular rate & rhythm. No rubs, gallops or murmurs. Lungs: + crackles Abdomen: Obese  soft, nontender, nondistended. No hepatosplenomegaly. No bruits or masses. Good bowel sounds. + ostomy  Extremities: no cyanosis, clubbing, rash, 3+ edema with weeping areas and severe erythema on R. 1-2+ edema on left Neuro: alert & orientedx3, cranial nerves grossly intact. moves all 4 extremities w/o difficulty. Affect pleasant  He is quite ill with severe cellulitis and probable sepsis on top of severe systolic HF. Now with AKI as well.   Continue IV abx. Will  give IV lasix. Place PICC line to measure CVP and co-ox. Low threshold to add inotropes and get CCM involved.   CRITICAL CARE Performed by: Arvilla MeresBensimhon, Oma Marzan  Total critical care time: 35 minutes  Critical care time was exclusive of separately billable procedures and treating other patients.  Critical care was necessary to treat or prevent imminent or life-threatening deterioration.  Critical care was time spent personally by me (independent of midlevel providers or residents) on the following activities: development of treatment plan with patient and/or surrogate as well as nursing, discussions with consultants, evaluation of patient's response to treatment, examination of patient,  obtaining history from patient or surrogate, ordering and performing treatments and interventions, ordering and review of laboratory studies, ordering and review of radiographic studies, pulse oximetry and re-evaluation of patient's condition.  Arvilla Meres, MD  9:35 AM

## 2020-08-04 NOTE — ED Notes (Signed)
Patient transported to CT 

## 2020-08-04 NOTE — Progress Notes (Signed)
PROGRESS NOTE  Roberto [name] KMQ:[id number] DOB: 1965-08-11 DOA: 08/02/2020 PCP: [name], MD  HPI/Recap of past 24 hours: Roberto [name] is a 55 y.o. male with history of chronic systolic heart failure last EF measured in December 2021 was 25 to 30% with grade 3 diastolic dysfunction was recently admitted and discharged home on July 21, 2020 for acute on chronic systolic heart failure had followed up with Dr. Gala Romney for his CHF.  Patient states he had gained at least 5 pounds since discharge.  Over the last 2 days patient also noticed increasing redness of his right lower extremity after he scratched his leg on the bed railing.  Had some discharge from a small opening in the leg. Patient was referred to the ER by Dr. Prescott Gum office. In the ER patient blood pressure is in the low normal at times reaching systolic of the 80s.  Afebrile.  Right lower extremity is erythematous with some discharge involving the right below-knee and ankle.  Chest x-ray shows features concerning for CHF.  Labs show BNP of 1400 and creatinine of 2.7 WBC of 15.  Digoxin level is 0.4 COVID test was negative.  Patient admitted for further management.    Today, patient still reporting shortness of breath, denies any chest pain, abdominal pain, nausea/vomiting, fever/chills.  Noted RLE cellulitis.  Labs pending    Assessment/Plan: Principal Problem:   Acute on chronic combined systolic and diastolic CHF (congestive heart failure) (HCC) Active Problems:   Chronic anticoagulation   Atypical atrial flutter (HCC)   Chronic kidney disease, stage 3b (HCC)   Cellulitis of right leg   Acute on chronic combined CHF BNP 1400 Chest x-ray with pulmonary edema Echo done on 12/21 showed EF of 25 to 30%, grade 3 diastolic dysfunction, repeat as per cardiology Cardiology on board, recommend IV Lasix, place PICC line to measure CVP and co-ox, may need to add inotropes and get critical care involved pending possible  cardiogenic shock, hold Coreg, Aldactone Continue digoxin, Farxiga Strict I's and O's, daily weights Monitor closely, telemetry  Right lower extremity cellulitis Currently afebrile, with leukocytosis Lactic acid 3.6, will trend CT scan RLE showed no evidence of osteomyelitis, abscess felt soft tissue emphysema BLE Doppler pending Continue vancomycin, cefepime  History of VT/ICD placement Noted 05/2020 with VT Continue amiodarone, mexiletine Monitor electrolytes closely, telemetry  Atrial flutter status post ablation Currently sinus rhythm Continue amiodarone, Eliquis  History of LV thrombus Continue Eliquis  CKD stage IIIb Baseline creatinine around 2.3 Monitor BMP closely while on diuresis  Diverticulitis s/p ileostomy Monitor  Morbid obesity Lifestyle modification advised     Estimated body mass index is 51.64 kg/m as calculated from the following:   Height as of this encounter: 5\' 8"  (1.727 m).   Weight as of this encounter: 154 kg.     Code Status: Full  Family Communication: None at bedside  Disposition Plan: Status is: Inpatient  Remains inpatient appropriate because:Inpatient level of care appropriate due to severity of illness  Dispo: The patient is from: Home              Anticipated d/c is to: Home              Patient currently is not medically stable to d/c.   Difficult to place patient No    Consultants: Cardiology  Procedures: None  Antimicrobials: Cefepime Vancomycin  DVT prophylaxis: Eliquis   Objective: Vitals:   08/04/20 1019 08/04/20 1030 08/04/20 1132 08/04/20 1347  BP: 93/69 (!) 86/42 106/70 99/66  Pulse: 81 82 85 85  Resp: (!) 24 (!) 26 (!) 33 (!) 21  Temp:   98.3 F (36.8 C)   TempSrc:   Oral   SpO2: 97% 94% 93% 95%  Weight:      Height:        Intake/Output Summary (Last 24 hours) at 08/04/2020 1504 Last data filed at 08/04/2020 1339 Gross per 24 hour  Intake --  Output 2510 ml  Net -2510 ml   Filed  Weights   07/28/2020 1655  Weight: (!) 154 kg    Exam: General: NAD, acutely ill-appearing Cardiovascular: S1, S2 present Respiratory: Bibasilar crackles Abdomen: Soft, nontender, nondistended, bowel sounds present, noted ileostomy Musculoskeletal: 3+ bilateral pedal edema noted Skin: RLE with erythema, skin tear to anterior tibia, noted clear drainage Psychiatry: Normal mood     Data Reviewed: CBC: Recent Labs  Lab 08/11/2020 1707  WBC 15.0*  NEUTROABS 11.9*  HGB 15.1  HCT 48.3  MCV 96.2  PLT 237   Basic Metabolic Panel: Recent Labs  Lab 07/17/2020 1707  NA 136  K 4.2  CL 101  CO2 24  GLUCOSE 135*  BUN 32*  CREATININE 2.72*  CALCIUM 8.6*   GFR: Estimated Creatinine Clearance: 45.1 mL/min (A) (by C-G formula based on SCr of 2.72 mg/dL (H)). Liver Function Tests: No results for input(s): AST, ALT, ALKPHOS, BILITOT, PROT, ALBUMIN in the last 168 hours. No results for input(s): LIPASE, AMYLASE in the last 168 hours. No results for input(s): AMMONIA in the last 168 hours. Coagulation Profile: No results for input(s): INR, PROTIME in the last 168 hours. Cardiac Enzymes: No results for input(s): CKTOTAL, CKMB, CKMBINDEX, TROPONINI in the last 168 hours. BNP (last 3 results) No results for input(s): PROBNP in the last 8760 hours. HbA1C: No results for input(s): HGBA1C in the last 72 hours. CBG: No results for input(s): GLUCAP in the last 168 hours. Lipid Profile: No results for input(s): CHOL, HDL, LDLCALC, TRIG, CHOLHDL, LDLDIRECT in the last 72 hours. Thyroid Function Tests: No results for input(s): TSH, T4TOTAL, FREET4, T3FREE, THYROIDAB in the last 72 hours. Anemia Panel: No results for input(s): VITAMINB12, FOLATE, FERRITIN, TIBC, IRON, RETICCTPCT in the last 72 hours. Urine analysis:    Component Value Date/Time   COLORURINE STRAW (A) 06/01/2020 0909   APPEARANCEUR CLEAR 06/01/2020 0909   LABSPEC 1.003 (L) 06/01/2020 0909   LABSPEC 1.025 03/18/2017 1337    PHURINE 5.0 06/01/2020 0909   GLUCOSEU >=500 (A) 06/01/2020 0909   HGBUR SMALL (A) 06/01/2020 0909   BILIRUBINUR NEGATIVE 06/01/2020 0909   BILIRUBINUR negative 03/18/2017 1337   BILIRUBINUR neg 12/17/2015 0944   KETONESUR NEGATIVE 06/01/2020 0909   PROTEINUR 100 (A) 06/01/2020 0909   UROBILINOGEN negative 12/17/2015 0944   UROBILINOGEN 0.2 12/16/2011 1208   NITRITE NEGATIVE 06/01/2020 0909   LEUKOCYTESUR NEGATIVE 06/01/2020 0909   Sepsis Labs: @LABRCNTIP (procalcitonin:4,lacticidven:4)  ) Recent Results (from the past 240 hour(s))  Resp Panel by RT-PCR (Flu A&B, Covid) Nasopharyngeal Swab     Status: None   Collection Time: 08/04/20  2:55 AM   Specimen: Nasopharyngeal Swab; Nasopharyngeal(NP) swabs in vial transport medium  Result Value Ref Range Status   SARS Coronavirus 2 by RT PCR NEGATIVE NEGATIVE Final    Comment: (NOTE) SARS-CoV-2 target nucleic acids are NOT DETECTED.  The SARS-CoV-2 RNA is generally detectable in upper respiratory specimens during the acute phase of infection. The lowest concentration of SARS-CoV-2 viral copies this assay can  detect is 138 copies/mL. A negative result does not preclude SARS-Cov-2 infection and should not be used as the sole basis for treatment or other patient management decisions. A negative result may occur with  improper specimen collection/handling, submission of specimen other than nasopharyngeal swab, presence of viral mutation(s) within the areas targeted by this assay, and inadequate number of viral copies(<138 copies/mL). A negative result must be combined with clinical observations, patient history, and epidemiological information. The expected result is Negative.  Fact Sheet for Patients:  BloggerCourse.com  Fact Sheet for Healthcare Providers:  SeriousBroker.it  This test is no t yet approved or cleared by the [place] FDA and  has been authorized for detection  and/or diagnosis of SARS-CoV-2 by FDA under an Emergency Use Authorization (EUA). This EUA will remain  in effect (meaning this test can be used) for the duration of the COVID-19 declaration under Section 564(b)(1) of the Act, 21 U.S.C.section 360bbb-3(b)(1), unless the authorization is terminated  or revoked sooner.       Influenza A by PCR NEGATIVE NEGATIVE Final   Influenza B by PCR NEGATIVE NEGATIVE Final    Comment: (NOTE) The Xpert Xpress SARS-CoV-2/FLU/RSV plus assay is intended as an aid in the diagnosis of influenza from Nasopharyngeal swab specimens and should not be used as a sole basis for treatment. Nasal washings and aspirates are unacceptable for Xpert Xpress SARS-CoV-2/FLU/RSV testing.  Fact Sheet for Patients: BloggerCourse.com  Fact Sheet for Healthcare Providers: SeriousBroker.it  This test is not yet approved or cleared by the [place] FDA and has been authorized for detection and/or diagnosis of SARS-CoV-2 by FDA under an Emergency Use Authorization (EUA). This EUA will remain in effect (meaning this test can be used) for the duration of the COVID-19 declaration under Section 564(b)(1) of the Act, 21 U.S.C. section 360bbb-3(b)(1), unless the authorization is terminated or revoked.  Performed at [facility], [address], [place], [place] [address]       Studies: DG Chest 2 View  Result Date: 07/15/2020 CLINICAL DATA:  Shortness of breath and cough for 2 weeks. EXAM: CHEST - 2 VIEW COMPARISON:  July 23, 2020 FINDINGS: A single lead ventricular pacer is noted. Mild to moderate severity diffusely increased interstitial lung markings are seen with prominence of the perihilar pulmonary vasculature. There is no evidence of a pleural effusion or pneumothorax. The cardiac silhouette is markedly enlarged. The visualized skeletal structures are unremarkable. IMPRESSION: Stable cardiomegaly with mild to  moderate severity pulmonary edema. Electronically Signed   By: Aram Candela M.D.   On: 08/01/2020 19:30   CT TIBIA FIBULA RIGHT WO CONTRAST  Result Date: 08/04/2020 CLINICAL DATA:  Osteomyelitis suspected. Discharge from small opening in the leg. EXAM: CT OF THE LOWER RIGHT EXTREMITY WITHOUT CONTRAST TECHNIQUE: Multidetector CT imaging of the right lower extremity was performed according to the standard protocol. COMPARISON:  None. FINDINGS: Bones/Joint/Cartilage Negative for fracture or erosion. Total knee arthroplasty without complicating features. Ligaments Suboptimally assessed by CT. Muscles and Tendons No visible muscle inflammation. No major tendon disruption/displacement at the ankle. Soft tissues Diffuse subcutaneous edema without visible collection and no soft tissue gas. IMPRESSION: 1. No evidence of osteomyelitis, abscess, or soft tissue emphysema. 2. Diffuse right leg edema. Electronically Signed   By: Marnee Spring M.D.   On: 08/04/2020 07:53   [place] EKG SITE RITE  Result Date: 08/04/2020 If Site Rite image not attached, placement could not be confirmed due to current cardiac rhythm.   Scheduled Meds:  amiodarone  200 mg Oral BID   apixaban  5 mg Oral BID   dapagliflozin propanediol  10 mg Oral Daily   digoxin  0.0625 mg Oral Daily   furosemide  80 mg Intravenous BID   mexiletine  300 mg Oral Q12H   pantoprazole  40 mg Oral Daily   ranolazine  500 mg Oral BID    Continuous Infusions:  ceFEPime (MAXIPIME) IV     [START ON 08/06/2020] vancomycin       LOS: 0 days     Briant Cedar, MD [facility]  If 7PM-7AM, please contact night-coverage [email or web address] 08/04/2020, 3:04 PM

## 2020-08-04 NOTE — Progress Notes (Signed)
Peripherally Inserted Central Catheter Placement  The IV Nurse has discussed with the patient and/or persons authorized to consent for the patient, the purpose of this procedure and the potential benefits and risks involved with this procedure.  The benefits include less needle sticks, lab draws from the catheter, and the patient may be discharged home with the catheter. Risks include, but not limited to, infection, bleeding, blood clot (thrombus formation), and puncture of an artery; nerve damage and irregular heartbeat and possibility to perform a PICC exchange if needed/ordered by physician.  Alternatives to this procedure were also discussed.  Bard Power PICC patient education guide, fact sheet on infection prevention and patient information card has been provided to patient /or left at bedside.    PICC Placement Documentation  PICC Double Lumen 08/04/20 PICC Right Cephalic 48 cm 3 cm (Active)  Indication for Insertion or Continuance of Line Chronic illness with exacerbations (CF, Sickle Cell, etc.);Vasoactive infusions 08/04/20 1731  Exposed Catheter (cm) 3 cm 08/04/20 1731  Site Assessment Clean;Dry;Intact 08/04/20 1731  Lumen #1 Status Flushed;Saline locked;Blood return noted 08/04/20 1731  Lumen #2 Status Flushed;Saline locked;Blood return noted 08/04/20 1731  Dressing Type Transparent 08/04/20 1731  Dressing Status Clean;Dry;Intact 08/04/20 1731  Antimicrobial disc in place? Yes 08/04/20 1731  Safety Lock Not Applicable 08/04/20 1731  Line Care Connections checked and tightened 08/04/20 1731  Line Adjustment (NICU/IV Team Only) No 08/04/20 1731  Dressing Intervention New dressing 08/04/20 1731  Dressing Change Due 08/11/20 08/04/20 1731       Elliot Dally 08/04/2020, 5:32 PM

## 2020-08-04 NOTE — ED Notes (Signed)
Phlebotomy at bedside attempting blood culture collection. Per note, IV vancomycin started prior to this collection

## 2020-08-04 NOTE — ED Notes (Signed)
Attempted IV x2, second RN to attempt 

## 2020-08-05 ENCOUNTER — Inpatient Hospital Stay (HOSPITAL_COMMUNITY): Payer: BC Managed Care – PPO

## 2020-08-05 DIAGNOSIS — L03115 Cellulitis of right lower limb: Secondary | ICD-10-CM | POA: Diagnosis not present

## 2020-08-05 DIAGNOSIS — R609 Edema, unspecified: Secondary | ICD-10-CM | POA: Diagnosis not present

## 2020-08-05 DIAGNOSIS — R0602 Shortness of breath: Secondary | ICD-10-CM

## 2020-08-05 DIAGNOSIS — I484 Atypical atrial flutter: Secondary | ICD-10-CM

## 2020-08-05 DIAGNOSIS — I5043 Acute on chronic combined systolic (congestive) and diastolic (congestive) heart failure: Secondary | ICD-10-CM | POA: Diagnosis not present

## 2020-08-05 DIAGNOSIS — Z7901 Long term (current) use of anticoagulants: Secondary | ICD-10-CM | POA: Diagnosis not present

## 2020-08-05 LAB — CBC WITH DIFFERENTIAL/PLATELET
Abs Immature Granulocytes: 0.24 10*3/uL — ABNORMAL HIGH (ref 0.00–0.07)
Basophils Absolute: 0 10*3/uL (ref 0.0–0.1)
Basophils Relative: 0 %
Eosinophils Absolute: 0.1 10*3/uL (ref 0.0–0.5)
Eosinophils Relative: 1 %
HCT: 36.8 % — ABNORMAL LOW (ref 39.0–52.0)
Hemoglobin: 11.7 g/dL — ABNORMAL LOW (ref 13.0–17.0)
Immature Granulocytes: 2 %
Lymphocytes Relative: 10 %
Lymphs Abs: 1.2 10*3/uL (ref 0.7–4.0)
MCH: 30.2 pg (ref 26.0–34.0)
MCHC: 31.8 g/dL (ref 30.0–36.0)
MCV: 94.8 fL (ref 80.0–100.0)
Monocytes Absolute: 1 10*3/uL (ref 0.1–1.0)
Monocytes Relative: 9 %
Neutro Abs: 9.2 10*3/uL — ABNORMAL HIGH (ref 1.7–7.7)
Neutrophils Relative %: 78 %
Platelets: 184 10*3/uL (ref 150–400)
RBC: 3.88 MIL/uL — ABNORMAL LOW (ref 4.22–5.81)
RDW: 15.4 % (ref 11.5–15.5)
WBC: 11.8 10*3/uL — ABNORMAL HIGH (ref 4.0–10.5)
nRBC: 0.2 % (ref 0.0–0.2)

## 2020-08-05 LAB — COOXEMETRY PANEL
Carboxyhemoglobin: 1.5 % (ref 0.5–1.5)
Methemoglobin: 0.7 % (ref 0.0–1.5)
O2 Saturation: 54 %
Total hemoglobin: 14.2 g/dL (ref 12.0–16.0)

## 2020-08-05 LAB — COMPREHENSIVE METABOLIC PANEL
ALT: 43 U/L (ref 0–44)
AST: 21 U/L (ref 15–41)
Albumin: 1.7 g/dL — ABNORMAL LOW (ref 3.5–5.0)
Alkaline Phosphatase: 62 U/L (ref 38–126)
Anion gap: 9 (ref 5–15)
BUN: 30 mg/dL — ABNORMAL HIGH (ref 6–20)
CO2: 23 mmol/L (ref 22–32)
Calcium: 7.7 mg/dL — ABNORMAL LOW (ref 8.9–10.3)
Chloride: 104 mmol/L (ref 98–111)
Creatinine, Ser: 1.96 mg/dL — ABNORMAL HIGH (ref 0.61–1.24)
GFR, Estimated: 40 mL/min — ABNORMAL LOW (ref >60)
Glucose, Bld: 115 mg/dL — ABNORMAL HIGH (ref 70–99)
Potassium: 3.3 mmol/L — ABNORMAL LOW (ref 3.5–5.1)
Sodium: 136 mmol/L (ref 135–145)
Total Bilirubin: 1 mg/dL (ref 0.3–1.2)
Total Protein: 5.6 g/dL — ABNORMAL LOW (ref 6.5–8.1)

## 2020-08-05 LAB — MAGNESIUM: Magnesium: 1.9 mg/dL (ref 1.7–2.4)

## 2020-08-05 MED ORDER — VANCOMYCIN HCL 1500 MG/300ML IV SOLN
1500.0000 mg | INTRAVENOUS | Status: DC
  Administered 2020-08-05 – 2020-08-08 (×4): 1500 mg via INTRAVENOUS
  Filled 2020-08-05 (×5): qty 300

## 2020-08-05 MED ORDER — MILRINONE LACTATE IN DEXTROSE 20-5 MG/100ML-% IV SOLN
0.2500 ug/kg/min | INTRAVENOUS | Status: DC
  Administered 2020-08-05 – 2020-08-06 (×3): 0.125 ug/kg/min via INTRAVENOUS
  Administered 2020-08-07 – 2020-08-12 (×14): 0.25 ug/kg/min via INTRAVENOUS
  Filled 2020-08-05 (×17): qty 100

## 2020-08-05 MED ORDER — SODIUM CHLORIDE 0.9 % IV SOLN
2.0000 g | Freq: Three times a day (TID) | INTRAVENOUS | Status: DC
  Administered 2020-08-05 – 2020-08-09 (×12): 2 g via INTRAVENOUS
  Filled 2020-08-05 (×12): qty 2

## 2020-08-05 MED ORDER — FUROSEMIDE 10 MG/ML IJ SOLN
12.0000 mg/h | INTRAVENOUS | Status: DC
  Administered 2020-08-05 – 2020-08-08 (×5): 12 mg/h via INTRAVENOUS
  Filled 2020-08-05 (×6): qty 20

## 2020-08-05 MED ORDER — POTASSIUM CHLORIDE CRYS ER 20 MEQ PO TBCR
40.0000 meq | EXTENDED_RELEASE_TABLET | Freq: Two times a day (BID) | ORAL | Status: AC
  Administered 2020-08-05 (×2): 40 meq via ORAL
  Filled 2020-08-05 (×2): qty 2

## 2020-08-05 NOTE — Progress Notes (Signed)
Pharmacy Antibiotic Note  Roberto [name] is a 55 y.o. male admitted on 07/26/2020 with cellulitis.  Pharmacy has been consulted for vancomycin and cefepime dosing.  Assessment: Patient received 2.5 gram load on 7/23 at 0500. Renal function has improved  since admission. The previous dose entered for 2g every other day would lead to a subtherapeutic AUC (338). Will adjust dose based on renal function to 1500 mg every day to target AUC of 507. Will also adjust Cefepime based on improved renal function.  Plan: Vancomycin 1500mg  daily to target AUC of 507 (Scr 1.91) Cefepime 2g IV Q8H.  Height: 5\' 8"  (172.7 cm) Weight: (!) 151.8 kg (334 lb 9.6 oz) IBW/kg (Calculated) : 68.4  Temp (24hrs), Avg:98.3 F (36.8 C), Min:97.8 F (36.6 C), Max:98.7 F (37.1 C)  Recent Labs  Lab 07/17/2020 1707 08/04/20 0605 08/04/20 1505 08/05/20 0430  WBC 15.0*  --  15.0* 11.8*  CREATININE 2.72*  --  2.40* 1.96*  LATICACIDVEN  --  3.6* 3.5*  --      Estimated Creatinine Clearance: 62 mL/min (A) (by C-G formula based on SCr of 1.96 mg/dL (H)).    Allergies  Allergen Reactions   Shrimp [Shellfish Allergy] Hives and Itching   Thank you for allowing pharmacy to participate in this patient's care.  08/06/20, PharmD PGY1 Pharmacy Resident 08/05/2020 11:38 AM Check [email or web address] for unit specific pharmacy number

## 2020-08-05 NOTE — Progress Notes (Signed)
Patient ID: Roberto [name], male   DOB: 04-08-1965, 55 y.o.   MRN: [id number]     Advanced Heart Failure Rounding Note  PCP-Cardiologist: Arvilla Meres, MD   Subjective:    This morning, co-ox 54% with CVP 20.  He did diurese some overnight with Lasix boluses, creatinine 2.4 => 1.96.  SBP 90s-100s.   He remains on vancomycin/cefepime for RLE cellulitis.  Blood cultures NGTD.    Objective:   Weight Range: (!) 151.8 kg Body mass index is 50.88 kg/m.   Vital Signs:   Temp:  [97.8 F (36.6 C)-98.7 F (37.1 C)] 98.4 F (36.9 C) (07/24 0807) Pulse Rate:  [76-91] 79 (07/24 0807) Resp:  [19-33] 20 (07/24 0807) BP: (95-118)/(57-90) 95/57 (07/24 0807) SpO2:  [92 %-97 %] 92 % (07/24 0807) Weight:  [151.8 kg-154.6 kg] 151.8 kg (07/24 0557) Last BM Date: 08/05/20  Weight change: Filed Weights   08/08/2020 1655 08/05/20 0529 08/05/20 0557  Weight: (!) 154 kg (!) 154.6 kg (!) 151.8 kg    Intake/Output:   Intake/Output Summary (Last 24 hours) at 08/05/2020 1109 Last data filed at 08/05/2020 0808 Gross per 24 hour  Intake 588 ml  Output 2600 ml  Net -2012 ml      Physical Exam    General:  Well appearing. No resp difficulty HEENT: Normal Neck: Supple. JVP 16+. Carotids 2+ bilat; no bruits. No lymphadenopathy or thyromegaly appreciated. Cor: PMI nondisplaced. Regular rate & rhythm. No rubs, gallops or murmurs. Lungs: Clear Abdomen: Soft, nontender, nondistended. No hepatosplenomegaly. No bruits or masses. Good bowel sounds. Extremities: No cyanosis, clubbing, rash. 2+ edema to knees.  RLE erythema.  Neuro: Alert & orientedx3, cranial nerves grossly intact. moves all 4 extremities w/o difficulty. Affect pleasant   Telemetry   NSR 60s (personally reviewed)  Labs    CBC Recent Labs    08/04/20 1505 08/05/20 0430  WBC 15.0* 11.8*  NEUTROABS 12.4* 9.2*  HGB 14.7 11.7*  HCT 45.8 36.8*  MCV 95.2 94.8  PLT 202 184   Basic Metabolic Panel Recent Labs     08/04/20 1505 08/05/20 0430  NA 137 136  K 4.0 3.3*  CL 103 104  CO2 23 23  GLUCOSE 200* 115*  BUN 33* 30*  CREATININE 2.40* 1.96*  CALCIUM 8.5* 7.7*  MG  --  1.9   Liver Function Tests Recent Labs    08/04/20 1505 08/05/20 0430  AST 26 21  ALT 57* 43  ALKPHOS 56 62  BILITOT 1.2 1.0  PROT 6.5 5.6*  ALBUMIN 2.1* 1.7*   No results for input(s): LIPASE, AMYLASE in the last 72 hours. Cardiac Enzymes No results for input(s): CKTOTAL, CKMB, CKMBINDEX, TROPONINI in the last 72 hours.  BNP: BNP (last 3 results) Recent Labs    07/19/20 0222 07/23/20 1117 07/26/2020 1707  BNP 959.9* 992.0* 1,426.8*    ProBNP (last 3 results) No results for input(s): PROBNP in the last 8760 hours.   D-Dimer No results for input(s): DDIMER in the last 72 hours. Hemoglobin A1C No results for input(s): HGBA1C in the last 72 hours. Fasting Lipid Panel No results for input(s): CHOL, HDL, LDLCALC, TRIG, CHOLHDL, LDLDIRECT in the last 72 hours. Thyroid Function Tests No results for input(s): TSH, T4TOTAL, T3FREE, THYROIDAB in the last 72 hours.  Invalid input(s): FREET3  Other results:   Imaging    No results found.   Medications:     Scheduled Medications:  amiodarone  200 mg Oral BID   apixaban  5 mg Oral BID   Chlorhexidine Gluconate Cloth  6 each Topical Daily   dapagliflozin propanediol  10 mg Oral Daily   digoxin  0.0625 mg Oral Daily   mexiletine  300 mg Oral Q12H   pantoprazole  40 mg Oral Daily   potassium chloride  40 mEq Oral BID   ranolazine  500 mg Oral BID   sodium chloride flush  10-40 mL Intracatheter Q12H    Infusions:  ceFEPime (MAXIPIME) IV 2 g (08/05/20 0536)   furosemide (LASIX) 200 mg in dextrose 5% 100 mL (2mg /mL) infusion     milrinone     [START ON 08/06/2020] vancomycin      PRN Medications: acetaminophen **OR** acetaminophen, guaiFENesin-dextromethorphan, sodium chloride flush   Assessment/Plan   1. 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   Echo 12/21 with EF 25-30%, moderate LV dilation.  Patient sent from office visit 07/23/2020 with cellulitis, as well as acute on chronic combined CHF. Lactate elevated to 3.6. Suspect cellulitis with sepsis in setting of acute/chronic systolic CHF. Nearing end-stage. Not candidate for advanced therapies with size, RV dysfunction, CKD, and ileostomy.  PICC placed, co-ox 54% today with marked volume overload and CVP 20.   - Lasix 80 mg IV x 1 and will start Lasix gtt 12 mg/hr.  - Add milrinone 0.125 mcg/kg/min.   - Continue to monitor electrolytes closely and replete as needed to maintain K >4, Mg >2 - Can hold carvedilol and spironolactone in the setting of decompensated CHF and AKI - Continue digoxin and farxiga 2. RLE cellulitis: Severe sepsis. Patient injured his leg 2 days ago resulting in significant erythema and weeping. No evidence of osteomyelitis or abscess on CT. Started on IV cefepime and vancomycin for cellulitis. - Continue management per primary team 3. History of VT: admission 05/2020 for VT with recurrence during admission, felt to be driving by electrolyte imbalance. Seen by EP and started on amiodarone and mexiletine with no evidence of recurrence on last interrogation 07/19/20. Patient denies ICD shocks - Continue amiodarone and mexiletine - Continue to monitor on telemetry - Continue to monitor electrolytes closely and replete as needed to maintain K >4, Mg >2 4. History of LV thrombus: on chronic eliquis without complaints of bleeding - Continue Eliquis 5. Atrial flutter s/p ablation: maintaining sinus rhythm. - Continue eliquis.  - Continue amiodarone for rhythm control 6. AKI on CKD stage 3b: Cr 2.7 on admission with variable baseline 1.8-2.5.  Creatinine down to 1.96 today. 7. Diverticulitis s/p ileostomy   Length of Stay: 1  09/19/20, MD  08/05/2020, 11:09 AM  Advanced Heart Failure Team Pager [phone] (M-F; 7a - 5p)  Please contact [facility] for night-coverage  after hours (5p -7a ) and weekends on [email or web address]

## 2020-08-05 NOTE — Progress Notes (Signed)
VASCULAR LAB    Bilateral lower extremity venous duplex has been performed.  See CV proc for preliminary results.   Dezyre Hoefer, RVT 08/05/2020, 12:34 PM

## 2020-08-05 NOTE — Progress Notes (Signed)
Mobility Specialist: Progress Note   08/05/20 1645  Mobility  Activity Ambulated in hall  Level of Assistance Standby assist, set-up cues, supervision of patient - no hands on  Assistive Device None  Distance Ambulated (ft) 80 ft (22'+18'+40')  Mobility Ambulated with assistance in hallway  Mobility Response Tolerated fair  Mobility performed by Mobility specialist  $Mobility charge 1 Mobility   During-Mobility: 99 HR, 94% SpO2 Post-Mobility: 89 HR, 96% SpO2  Pt visibly SOB during ambulation, otherwise asx. Pt stopped x2 for brief standing breaks d/t SOB, distance as seen above. Pt back to bed after ambulation with call bell at his side.   [facility] Lianne Carreto Mobility Specialist Mobility Specialist Phone: [phone]

## 2020-08-05 NOTE — Progress Notes (Signed)
PROGRESS NOTE  COULTON [name] QQI:[id number] DOB: 1965-03-30 DOA: 08-31-20 PCP: [name], MD  HPI/Recap of past 24 hours: Roberto [name] is a 55 y.o. male with history of chronic systolic heart failure last EF measured in December 2021 was 25 to 30% with grade 3 diastolic dysfunction was recently admitted and discharged home on July 21, 2020 for acute on chronic systolic heart failure had followed up with Dr. Gala Romney for his CHF.  Patient states he had gained at least 5 pounds since discharge.  Over the last 2 days patient also noticed increasing redness of his right lower extremity after he scratched his leg on the bed railing.  Had some discharge from a small opening in the leg. Patient was referred to the ER by Dr. Prescott Gum office. In the ER patient blood pressure is in the low normal at times reaching systolic of the 80s.  Afebrile.  Right lower extremity is erythematous with some discharge involving the right below-knee and ankle.  Chest x-ray shows features concerning for CHF.  Labs show BNP of 1400 and creatinine of 2.7 WBC of 15.  Digoxin level is 0.4 COVID test was negative.  Patient admitted for further management.    Today, patient denies any worsening shortness of breath, denies any chest pain, abdominal pain, nausea/vomiting, fever/chills.    Assessment/Plan: Principal Problem:   Acute on chronic combined systolic and diastolic CHF (congestive heart failure) (HCC) Active Problems:   Chronic anticoagulation   Atypical atrial flutter (HCC)   Chronic kidney disease, stage 3b (HCC)   Cellulitis of right leg   Acute on chronic combined CHF BNP 1400 Chest x-ray with pulmonary edema Echo done on 12/21 showed EF of 25 to 30%, grade 3 diastolic dysfunction, repeat as per cardiology Cardiology on board, started Lasix drip, milrinone, continue to hold Coreg, Aldactone Continue digoxin, Farxiga Strict I's and O's, daily weights Monitor closely, telemetry  Right lower  extremity cellulitis Currently afebrile, with leukocytosis Lactic acid 3.6, will trend CT scan RLE showed no evidence of osteomyelitis, abscess felt soft tissue emphysema BLE Doppler negative for DVT Continue vancomycin, cefepime  Hypokalemia Replace as needed  History of VT/ICD placement Noted 05/2020 with VT Continue amiodarone, mexiletine Monitor electrolytes closely, telemetry  Atrial flutter status post ablation Currently sinus rhythm Continue amiodarone, Eliquis  History of LV thrombus Continue Eliquis  AKI on CKD stage IIIb Baseline creatinine around 2.3 Monitor BMP closely while on diuresis  Diverticulitis s/p ileostomy Monitor  Morbid obesity Lifestyle modification advised     Estimated body mass index is 50.88 kg/m as calculated from the following:   Height as of this encounter: 5\' 8"  (1.727 m).   Weight as of this encounter: 151.8 kg.     Code Status: Full  Family Communication: None at bedside  Disposition Plan: Status is: Inpatient  Remains inpatient appropriate because:Inpatient level of care appropriate due to severity of illness  Dispo: The patient is from: Home              Anticipated d/c is to: Home              Patient currently is not medically stable to d/c.   Difficult to place patient No    Consultants: Cardiology  Procedures: None  Antimicrobials: Cefepime Vancomycin  DVT prophylaxis: Eliquis   Objective: Vitals:   08/05/20 0557 08/05/20 0807 08/05/20 1301 08/05/20 1712  BP:  (!) 95/57 102/77 (!) 100/56  Pulse:  79 81 81  Resp:  20 20 20   Temp:  98.4 F (36.9 C) 98 F (36.7 C) 98 F (36.7 C)  TempSrc:  Oral Oral Oral  SpO2:  92% 93% 93%  Weight: (!) 151.8 kg     Height:        Intake/Output Summary (Last 24 hours) at 08/05/2020 1718 Last data filed at 08/05/2020 1535 Gross per 24 hour  Intake 987.63 ml  Output 1350 ml  Net -362.37 ml   Filed Weights   07/21/2020 1655 08/05/20 0529 08/05/20 0557  Weight:  (!) 154 kg (!) 154.6 kg (!) 151.8 kg    Exam: General: NAD Cardiovascular: S1, S2 present Respiratory: Bibasilar crackles Abdomen: Soft, nontender, nondistended, bowel sounds present, noted ileostomy Musculoskeletal: 3+ bilateral pedal edema noted Skin: RLE with erythema, skin tear to anterior tibia, noted clear drainage Psychiatry: Normal mood     Data Reviewed: CBC: Recent Labs  Lab 08/09/2020 1707 08/04/20 1505 08/05/20 0430  WBC 15.0* 15.0* 11.8*  NEUTROABS 11.9* 12.4* 9.2*  HGB 15.1 14.7 11.7*  HCT 48.3 45.8 36.8*  MCV 96.2 95.2 94.8  PLT 237 202 184   Basic Metabolic Panel: Recent Labs  Lab 08/02/2020 1707 08/04/20 1505 08/05/20 0430  NA 136 137 136  K 4.2 4.0 3.3*  CL 101 103 104  CO2 24 23 23   GLUCOSE 135* 200* 115*  BUN 32* 33* 30*  CREATININE 2.72* 2.40* 1.96*  CALCIUM 8.6* 8.5* 7.7*  MG  --   --  1.9   GFR: Estimated Creatinine Clearance: 62 mL/min (A) (by C-G formula based on SCr of 1.96 mg/dL (H)). Liver Function Tests: Recent Labs  Lab 08/04/20 1505 08/05/20 0430  AST 26 21  ALT 57* 43  ALKPHOS 56 62  BILITOT 1.2 1.0  PROT 6.5 5.6*  ALBUMIN 2.1* 1.7*   No results for input(s): LIPASE, AMYLASE in the last 168 hours. No results for input(s): AMMONIA in the last 168 hours. Coagulation Profile: No results for input(s): INR, PROTIME in the last 168 hours. Cardiac Enzymes: No results for input(s): CKTOTAL, CKMB, CKMBINDEX, TROPONINI in the last 168 hours. BNP (last 3 results) No results for input(s): PROBNP in the last 8760 hours. HbA1C: No results for input(s): HGBA1C in the last 72 hours. CBG: No results for input(s): GLUCAP in the last 168 hours. Lipid Profile: No results for input(s): CHOL, HDL, LDLCALC, TRIG, CHOLHDL, LDLDIRECT in the last 72 hours. Thyroid Function Tests: No results for input(s): TSH, T4TOTAL, FREET4, T3FREE, THYROIDAB in the last 72 hours. Anemia Panel: No results for input(s): VITAMINB12, FOLATE, FERRITIN, TIBC,  IRON, RETICCTPCT in the last 72 hours. Urine analysis:    Component Value Date/Time   COLORURINE STRAW (A) 06/01/2020 0909   APPEARANCEUR CLEAR 06/01/2020 0909   LABSPEC 1.003 (L) 06/01/2020 0909   LABSPEC 1.025 03/18/2017 1337   PHURINE 5.0 06/01/2020 0909   GLUCOSEU >=500 (A) 06/01/2020 0909   HGBUR SMALL (A) 06/01/2020 0909   BILIRUBINUR NEGATIVE 06/01/2020 0909   BILIRUBINUR negative 03/18/2017 1337   BILIRUBINUR neg 12/17/2015 0944   KETONESUR NEGATIVE 06/01/2020 0909   PROTEINUR 100 (A) 06/01/2020 0909   UROBILINOGEN negative 12/17/2015 0944   UROBILINOGEN 0.2 12/16/2011 1208   NITRITE NEGATIVE 06/01/2020 0909   LEUKOCYTESUR NEGATIVE 06/01/2020 0909   Sepsis Labs: @LABRCNTIP (procalcitonin:4,lacticidven:4)  ) Recent Results (from the past 240 hour(s))  Resp Panel by RT-PCR (Flu A&B, Covid) Nasopharyngeal Swab     Status: None   Collection Time: 08/04/20  2:55 AM   Specimen: Nasopharyngeal Swab;  Nasopharyngeal(NP) swabs in vial transport medium  Result Value Ref Range Status   SARS Coronavirus 2 by RT PCR NEGATIVE NEGATIVE Final    Comment: (NOTE) SARS-CoV-2 target nucleic acids are NOT DETECTED.  The SARS-CoV-2 RNA is generally detectable in upper respiratory specimens during the acute phase of infection. The lowest concentration of SARS-CoV-2 viral copies this assay can detect is 138 copies/mL. A negative result does not preclude SARS-Cov-2 infection and should not be used as the sole basis for treatment or other patient management decisions. A negative result may occur with  improper specimen collection/handling, submission of specimen other than nasopharyngeal swab, presence of viral mutation(s) within the areas targeted by this assay, and inadequate number of viral copies(<138 copies/mL). A negative result must be combined with clinical observations, patient history, and epidemiological information. The expected result is Negative.  Fact Sheet for Patients:   BloggerCourse.comhttps://www.fda.gov/media/152166/download  Fact Sheet for Healthcare Providers:  SeriousBroker.ithttps://www.fda.gov/media/152162/download  This test is no t yet approved or cleared by the [place] FDA and  has been authorized for detection and/or diagnosis of SARS-CoV-2 by FDA under an Emergency Use Authorization (EUA). This EUA will remain  in effect (meaning this test can be used) for the duration of the COVID-19 declaration under Section 564(b)(1) of the Act, 21 U.S.C.section 360bbb-3(b)(1), unless the authorization is terminated  or revoked sooner.       Influenza A by PCR NEGATIVE NEGATIVE Final   Influenza B by PCR NEGATIVE NEGATIVE Final    Comment: (NOTE) The Xpert Xpress SARS-CoV-2/FLU/RSV plus assay is intended as an aid in the diagnosis of influenza from Nasopharyngeal swab specimens and should not be used as a sole basis for treatment. Nasal washings and aspirates are unacceptable for Xpert Xpress SARS-CoV-2/FLU/RSV testing.  Fact Sheet for Patients: BloggerCourse.comhttps://www.fda.gov/media/152166/download  Fact Sheet for Healthcare Providers: SeriousBroker.ithttps://www.fda.gov/media/152162/download  This test is not yet approved or cleared by the [place] FDA and has been authorized for detection and/or diagnosis of SARS-CoV-2 by FDA under an Emergency Use Authorization (EUA). This EUA will remain in effect (meaning this test can be used) for the duration of the COVID-19 declaration under Section 564(b)(1) of the Act, 21 U.S.C. section 360bbb-3(b)(1), unless the authorization is terminated or revoked.  Performed at [facility], [address] [place], [place] [address]   Culture, blood (routine x 2)     Status: None (Preliminary result)   Collection Time: 08/04/20  6:05 AM   Specimen: BLOOD LEFT HAND  Result Value Ref Range Status   Specimen Description BLOOD LEFT HAND  Final   Special Requests   Final    BOTTLES DRAWN AEROBIC AND ANAEROBIC Blood Culture results may not be optimal  due to an inadequate volume of blood received in culture bottles   Culture   Final    NO GROWTH < 24 HOURS Performed at [facility], [address] [place], [place] [address]    Report Status PENDING  Incomplete  Culture, blood (routine x 2)     Status: None (Preliminary result)   Collection Time: 08/04/20  6:10 AM   Specimen: BLOOD RIGHT HAND  Result Value Ref Range Status   Specimen Description BLOOD RIGHT HAND  Final   Special Requests   Final    BOTTLES DRAWN AEROBIC AND ANAEROBIC Blood Culture results may not be optimal due to an inadequate volume of blood received in culture bottles   Culture   Final    NO GROWTH < 24 HOURS Performed  at [facility], [address], [place], [place] [address]    Report Status PENDING  Incomplete      Studies: VAS [place] LOWER EXTREMITY VENOUS (DVT)  Result Date: 08/05/2020  Lower Venous DVT Study Patient Name:  ONYEKACHI GATHRIGHT [facility]  Date of Exam:   08/05/2020 Medical Rec #: [id number]       Accession #:    [id number] Date of Birth: 08-10-1965       Patient Gender: M Patient Age:   21Y Exam Location:  [facility] Procedure:      VAS [place] LOWER EXTREMITY VENOUS (DVT) Referring Phys: [id number] [facility] J Kennah Hehr --------------------------------------------------------------------------------  Indications: Swelling, Edema, and Erythema.  Limitations: Body habitus, Pitting edema and pain with compression. Comparison Study: Prior negative right LEV done 07/19/2020. Prior negative left                   LEV done 12/01/14 Performing Technologist: Sherren Kerns RVS  Examination Guidelines: A complete evaluation includes B-mode imaging, spectral Doppler, color Doppler, and power Doppler as needed of all accessible portions of each vessel. Bilateral testing is considered an integral part of a complete examination. Limited examinations for reoccurring indications may be performed as noted. The reflux portion of the exam is performed with the patient in  reverse Trendelenburg.  +---------+---------------+---------+-----------+----------+-------------------+ RIGHT    CompressibilityPhasicitySpontaneityPropertiesThrombus Aging      +---------+---------------+---------+-----------+----------+-------------------+ CFV      Full                                         pulsatile waveforms +---------+---------------+---------+-----------+----------+-------------------+ SFJ      Full                                                             +---------+---------------+---------+-----------+----------+-------------------+ FV Prox  Full                                                             +---------+---------------+---------+-----------+----------+-------------------+ FV Mid   Full                                                             +---------+---------------+---------+-----------+----------+-------------------+ FV Distal                                             patent by color and                                                       Doppler             +---------+---------------+---------+-----------+----------+-------------------+  PFV      Full                                         pulsatile waveforms +---------+---------------+---------+-----------+----------+-------------------+ POP      Full                                                             +---------+---------------+---------+-----------+----------+-------------------+ PTV      Full                                                             +---------+---------------+---------+-----------+----------+-------------------+ PERO                                                  Not well visualized +---------+---------------+---------+-----------+----------+-------------------+   +---------+---------------+---------+-----------+----------+-------------------+ LEFT      CompressibilityPhasicitySpontaneityPropertiesThrombus Aging      +---------+---------------+---------+-----------+----------+-------------------+ CFV      Full                                         pulsatile waveforms +---------+---------------+---------+-----------+----------+-------------------+ SFJ      Full                                                             +---------+---------------+---------+-----------+----------+-------------------+ FV Prox  Full                                                             +---------+---------------+---------+-----------+----------+-------------------+ FV Mid   Full                                                             +---------+---------------+---------+-----------+----------+-------------------+ FV Distal               Yes      Yes                  patent by color and  Doppler             +---------+---------------+---------+-----------+----------+-------------------+ PFV      Full                                                             +---------+---------------+---------+-----------+----------+-------------------+ POP                                                   patent by color and                                                       Doppler             +---------+---------------+---------+-----------+----------+-------------------+ PTV                                                   patent by color     +---------+---------------+---------+-----------+----------+-------------------+ PERO                                                  Not well visualized +---------+---------------+---------+-----------+----------+-------------------+     Summary: RIGHT: - There is no evidence of deep vein thrombosis in the lower extremity. However, portions of this examination were limited- see technologist comments above.   pulsatile waveforms suggestive of fluid overload  LEFT: - There is no evidence of deep vein thrombosis in the lower extremity. However, portions of this examination were limited- see technologist comments above.  Pulsatile waveforms suggestive of fluid overload.  *See table(s) above for measurements and observations.    Preliminary     Scheduled Meds:  amiodarone  200 mg Oral BID   apixaban  5 mg Oral BID   Chlorhexidine Gluconate Cloth  6 each Topical Daily   dapagliflozin propanediol  10 mg Oral Daily   digoxin  0.0625 mg Oral Daily   mexiletine  300 mg Oral Q12H   pantoprazole  40 mg Oral Daily   potassium chloride  40 mEq Oral BID   ranolazine  500 mg Oral BID   sodium chloride flush  10-40 mL Intracatheter Q12H    Continuous Infusions:  ceFEPime (MAXIPIME) IV Stopped (08/05/20 1307)   furosemide (LASIX) 200 mg in dextrose 5% 100 mL (2mg /mL) infusion 12 mg/hr (08/05/20 1535)   milrinone 0.123 mcg/kg/min (08/05/20 1535)   vancomycin 150 mL/hr at 08/05/20 1535     LOS: 1 day     08/07/20, MD [facility]  If 7PM-7AM, please contact night-coverage [email or web address] 08/05/2020, 5:18 PM

## 2020-08-06 DIAGNOSIS — Z7901 Long term (current) use of anticoagulants: Secondary | ICD-10-CM | POA: Diagnosis not present

## 2020-08-06 DIAGNOSIS — L03115 Cellulitis of right lower limb: Secondary | ICD-10-CM | POA: Diagnosis not present

## 2020-08-06 DIAGNOSIS — I484 Atypical atrial flutter: Secondary | ICD-10-CM | POA: Diagnosis not present

## 2020-08-06 DIAGNOSIS — I5043 Acute on chronic combined systolic (congestive) and diastolic (congestive) heart failure: Secondary | ICD-10-CM | POA: Diagnosis not present

## 2020-08-06 LAB — CBC WITH DIFFERENTIAL/PLATELET
Abs Immature Granulocytes: 0.37 10*3/uL — ABNORMAL HIGH (ref 0.00–0.07)
Basophils Absolute: 0.1 10*3/uL (ref 0.0–0.1)
Basophils Relative: 1 %
Eosinophils Absolute: 0.2 10*3/uL (ref 0.0–0.5)
Eosinophils Relative: 2 %
HCT: 43.7 % (ref 39.0–52.0)
Hemoglobin: 13.8 g/dL (ref 13.0–17.0)
Immature Granulocytes: 3 %
Lymphocytes Relative: 9 %
Lymphs Abs: 1.1 10*3/uL (ref 0.7–4.0)
MCH: 29.7 pg (ref 26.0–34.0)
MCHC: 31.6 g/dL (ref 30.0–36.0)
MCV: 94 fL (ref 80.0–100.0)
Monocytes Absolute: 1.2 10*3/uL — ABNORMAL HIGH (ref 0.1–1.0)
Monocytes Relative: 10 %
Neutro Abs: 9.1 10*3/uL — ABNORMAL HIGH (ref 1.7–7.7)
Neutrophils Relative %: 75 %
Platelets: 226 10*3/uL (ref 150–400)
RBC: 4.65 MIL/uL (ref 4.22–5.81)
RDW: 15.3 % (ref 11.5–15.5)
WBC: 12.1 10*3/uL — ABNORMAL HIGH (ref 4.0–10.5)
nRBC: 0.3 % — ABNORMAL HIGH (ref 0.0–0.2)

## 2020-08-06 LAB — COMPREHENSIVE METABOLIC PANEL
ALT: 40 U/L (ref 0–44)
AST: 20 U/L (ref 15–41)
Albumin: 1.9 g/dL — ABNORMAL LOW (ref 3.5–5.0)
Alkaline Phosphatase: 51 U/L (ref 38–126)
Anion gap: 9 (ref 5–15)
BUN: 29 mg/dL — ABNORMAL HIGH (ref 6–20)
CO2: 25 mmol/L (ref 22–32)
Calcium: 8.3 mg/dL — ABNORMAL LOW (ref 8.9–10.3)
Chloride: 103 mmol/L (ref 98–111)
Creatinine, Ser: 2.07 mg/dL — ABNORMAL HIGH (ref 0.61–1.24)
GFR, Estimated: 37 mL/min — ABNORMAL LOW (ref >60)
Glucose, Bld: 124 mg/dL — ABNORMAL HIGH (ref 70–99)
Potassium: 3.6 mmol/L (ref 3.5–5.1)
Sodium: 137 mmol/L (ref 135–145)
Total Bilirubin: 1.1 mg/dL (ref 0.3–1.2)
Total Protein: 6.4 g/dL — ABNORMAL LOW (ref 6.5–8.1)

## 2020-08-06 LAB — LACTIC ACID, PLASMA: Lactic Acid, Venous: 1.5 mmol/L (ref 0.5–1.9)

## 2020-08-06 LAB — COOXEMETRY PANEL
Carboxyhemoglobin: 1.6 % — ABNORMAL HIGH (ref 0.5–1.5)
Methemoglobin: 0.9 % (ref 0.0–1.5)
O2 Saturation: 51.2 %
Total hemoglobin: 14.3 g/dL (ref 12.0–16.0)

## 2020-08-06 LAB — MAGNESIUM: Magnesium: 2 mg/dL (ref 1.7–2.4)

## 2020-08-06 MED ORDER — GUAIFENESIN ER 600 MG PO TB12
600.0000 mg | ORAL_TABLET | Freq: Two times a day (BID) | ORAL | Status: DC
  Administered 2020-08-06 – 2020-08-16 (×20): 600 mg via ORAL
  Filled 2020-08-06 (×20): qty 1

## 2020-08-06 MED ORDER — POTASSIUM CHLORIDE CRYS ER 20 MEQ PO TBCR
40.0000 meq | EXTENDED_RELEASE_TABLET | Freq: Two times a day (BID) | ORAL | Status: AC
  Administered 2020-08-06 (×2): 40 meq via ORAL
  Filled 2020-08-06 (×2): qty 2

## 2020-08-06 NOTE — Evaluation (Signed)
Physical Therapy Evaluation Patient Details Name: Roberto [name] MRN: [id number] DOB: 1965/02/06 Today's Date: 08/06/2020   History of Present Illness  54y/o male admitted 7/22 with acute on chronic combined systolic and diastolic CHF and cellulitis. PMH: d/c 07/21/20 for acute on chronic systolic CHF with grade 3 diastolic dysfunction, diverticulitis, CAD, DVT, venous insufficiency, atypical atrial flutter, gout, CKD stage III, nonischemic cardiomyopathy, and hepatitis.  Clinical Impression  Pt demonstrated similar ambulation distance to PLOF, with increased RR and noted decrease over the last 2 months (see vitals below). Pt educated on energy conservation during functional activities. Pt's deficits include decreased activity tolerance and ROM. Pt would benefit from PT to improve said deficits and function. Recommend HH following d/c. Pre-vitals - HR 94, RR 38 Vitals during ambulation - HR 113 max, RR 42 max Post-vitals - HR 98, spO2 93%    Follow Up Recommendations Home health PT    Equipment Recommendations  None recommended by PT    Recommendations for Other Services       Precautions / Restrictions Restrictions Weight Bearing Restrictions: No      Mobility  Bed Mobility                    Transfers Overall transfer level: Modified independent   Transfers: Sit to/from Stand Sit to Stand: Modified independent (Device/Increase time)            Ambulation/Gait   Gait Distance (Feet): 130 Feet Assistive device: None Gait Pattern/deviations: Step-through pattern   Gait velocity interpretation: 1.31 - 2.62 ft/sec, indicative of limited community ambulator General Gait Details: Limited by fatigue. Required brief standing rest breaks x2. Educated on energy conservation.  Stairs            Wheelchair Mobility    Modified Rankin (Stroke Patients Only)       Balance Overall balance assessment: No apparent balance deficits (not formally assessed)                                            Pertinent Vitals/Pain Pain Assessment: 0-10 Pain Score: 5  Pain Location: LLE R>L Pain Descriptors / Indicators: Discomfort Pain Intervention(s): Monitored during session;Repositioned    Home Living Family/patient expects to be discharged to:: Private residence Living Arrangements: Alone Available Help at Discharge: Friend(s);Available PRN/intermittently Type of Home: Apartment Home Access: Level entry     Home Layout: One level Home Equipment: None      Prior Function Level of Independence: Independent         Comments: Has groceries delivered     Hand Dominance   Dominant Hand: Right    Extremity/Trunk Assessment   Upper Extremity Assessment Upper Extremity Assessment: Overall WFL for tasks assessed    Lower Extremity Assessment Lower Extremity Assessment: Overall WFL for tasks assessed    Cervical / Trunk Assessment Cervical / Trunk Assessment: Normal  Communication   Communication: No difficulties  Cognition Arousal/Alertness: Awake/alert Behavior During Therapy: WFL for tasks assessed/performed Overall Cognitive Status: Within Functional Limits for tasks assessed                                 General Comments: Responded to commands accurately      General Comments      Exercises General Exercises - Lower Extremity Long Arc Quad:  AROM;10 reps;Both;Seated Toe Raises: AROM;Both;10 reps;Seated Heel Raises: AROM;10 reps;Seated;Both (bil decreased plantarflexion ROM)   Assessment/Plan    PT Assessment Patient needs continued PT services  PT Problem List Decreased range of motion;Decreased activity tolerance       PT Treatment Interventions Gait training;Therapeutic activities;Patient/family education    PT Goals (Current goals can be found in the Care Plan section)  Acute Rehab PT Goals Patient Stated Goal: To return home and work out again PT Goal Formulation: With  patient Time For Goal Achievement: 08/28/20 Potential to Achieve Goals: Good    Frequency Min 3X/week   Barriers to discharge        Co-evaluation               AM-PAC PT "6 Clicks" Mobility  Outcome Measure Help needed turning from your back to your side while in a flat bed without using bedrails?: None Help needed moving from lying on your back to sitting on the side of a flat bed without using bedrails?: None Help needed moving to and from a bed to a chair (including a wheelchair)?: None Help needed standing up from a chair using your arms (e.g., wheelchair or bedside chair)?: None Help needed to walk in hospital room?: None Help needed climbing 3-5 steps with a railing? : A Little 6 Click Score: 23    End of Session Equipment Utilized During Treatment: Gait belt Activity Tolerance: Patient limited by fatigue Patient left: in chair;with call bell/phone within reach Nurse Communication: Mobility status PT Visit Diagnosis: Other abnormalities of gait and mobility (R26.89)    Time: 2703-5009 PT Time Calculation (min) (ACUTE ONLY): 27 min   Charges:   PT Evaluation $PT Eval Moderate Complexity: 1 Mod PT Treatments $Gait Training: 8-22 mins        Velda Shell, SPT Acute Rehab: [phone]   Vance Gather 08/06/2020, 1:54 PM

## 2020-08-06 NOTE — Evaluation (Signed)
Occupational Therapy Evaluation/Discharge Patient Details Name: Roberto [name] MRN: [id number] DOB: [birth year] Today's Date: 08/06/2020    History of Present Illness 54y/o male admitted 7/22 with acute on chronic combined systolic and diastolic CHF and cellulitis. PMH: d/c 07/21/20 for acute on chronic systolic CHF with grade 3 diastolic dysfunction, diverticulitis, CAD, DVT, venous insufficiency, atypical atrial flutter, gout, CKD stage III, nonischemic cardiomyopathy, and hepatitis.   Clinical Impression   PTA, pt lives alone and reports Independence with ADLs, IADLs and mobility. Pt presents now with decreased cardiopulmonary tolerance. Pt overall distant supervision for short mobility in room and endorses SOB with basic ADLs standing at sink. Pt also with difficulty reaching B feet for ADLs due to swelling. Educated on energy conservation strategies, strategies to prevent CHF exacerbation, and encouraged progression of activity tolerance while hospitalized. Anticipate once diuresed, pt will have increased ease of LB ADL completion with no further skilled OT services needed at this time. OT to sign off. Please reconsult if needs change.   SpO2 90% on RA after activity, 95% with seated rest break.  HR up to 114bpm    Follow Up Recommendations  No OT follow up    Equipment Recommendations  None recommended by OT    Recommendations for Other Services       Precautions / Restrictions Precautions Precautions: Fall Restrictions Weight Bearing Restrictions: No      Mobility Bed Mobility Overal bed mobility: Modified Independent             General bed mobility comments: increased time/effort    Transfers Overall transfer level: Independent Equipment used: None             General transfer comment: No use of AD for sit to stand, increased time to rise    Balance Overall balance assessment: No apparent balance deficits (not formally assessed)                                          ADL either performed or assessed with clinical judgement   ADL Overall ADL's : Needs assistance/impaired Eating/Feeding: Independent;Sitting   Grooming: Independent;Standing;Oral care;Wash/dry face Grooming Details (indicate cue type and reason): No LOB and no assist needed             Lower Body Dressing: Minimal assistance;Sit to/from stand Lower Body Dressing Details (indicate cue type and reason): Reports staff "have just been putting my socks on for me" though reports he is able to don socks sitting EOB at home. Perhaps self limiting behaviors factor in       Toileting - Clothing Manipulation Details (indicate cue type and reason): Pt reports using urinal and has colostomy bag       General ADL Comments: Due to R LE soreness to touch and increased B LE swelling, difficulty with LB ADLs. Provided CHF & Self care handout with reinforcement of daily weights, fluid restrictions per MD advice, and sodium restrictions.     Vision Baseline Vision/History: Wears glasses Patient Visual Report: No change from baseline Vision Assessment?: No apparent visual deficits     Perception     Praxis      Pertinent Vitals/Pain Pain Assessment: No/denies pain     Hand Dominance Right   Extremity/Trunk Assessment Upper Extremity Assessment Upper Extremity Assessment: Overall WFL for tasks assessed   Lower Extremity Assessment Lower Extremity Assessment: Defer to PT evaluation  Cervical / Trunk Assessment Cervical / Trunk Assessment: Normal   Communication Communication Communication: No difficulties   Cognition Arousal/Alertness: Awake/alert Behavior During Therapy: WFL for tasks assessed/performed Overall Cognitive Status: Within Functional Limits for tasks assessed                                     General Comments  SpO2 90% on RA after ADLs, endorses SOB. 95% with seated rest break. Noted weeping from R LE and linens  soiled due to this, RN and NT aware    Exercises     Shoulder Instructions      Home Living Family/patient expects to be discharged to:: Private residence Living Arrangements: Alone Available Help at Discharge: Friend(s);Available PRN/intermittently Type of Home: Apartment Home Access: Level entry     Home Layout: One level     Bathroom Shower/Tub: Chief Strategy Officer: Standard     Home Equipment: None          Prior Functioning/Environment Level of Independence: Independent                 OT Problem List:        OT Treatment/Interventions:      OT Goals(Current goals can be found in the care plan section) Acute Rehab OT Goals Patient Stated Goal: decrease SOB OT Goal Formulation: All assessment and education complete, DC therapy  OT Frequency:     Barriers to D/C:            Co-evaluation              AM-PAC OT "6 Clicks" Daily Activity     Outcome Measure Help from another person eating meals?: None Help from another person taking care of personal grooming?: None Help from another person toileting, which includes using toliet, bedpan, or urinal?: A Little Help from another person bathing (including washing, rinsing, drying)?: A Little Help from another person to put on and taking off regular upper body clothing?: None Help from another person to put on and taking off regular lower body clothing?: A Little 6 Click Score: 21   End of Session Nurse Communication: Mobility status  Activity Tolerance: Patient tolerated treatment well Patient left: in bed;with call bell/phone within reach  OT Visit Diagnosis: Other (comment) (decreased cardiopulmonary tolerance)                Time: 7517-0017 OT Time Calculation (min): 32 min Charges:  OT General Charges $OT Visit: 1 Visit OT Evaluation $OT Eval Low Complexity: 1 Low OT Treatments $Self Care/Home Management : 8-22 mins  Bradd Canary, OTR/L Acute Rehab Services Office:  [phone]   Lorre Munroe 08/06/2020, 8:30 AM

## 2020-08-06 NOTE — Progress Notes (Signed)
Mobility Specialist: Progress Note   08/06/20 1621  Mobility  Activity Ambulated in hall  Level of Assistance Independent  Assistive Device None  Distance Ambulated (ft) 136 ft [phone]  Mobility Ambulated independently in hallway  Mobility Response Tolerated fair  Mobility performed by Mobility specialist  $Mobility charge 1 Mobility   Pre-Mobility: 85 HR, 90% SpO2 During Mobility: 119 HR, 95% SpO2 Post-Mobility: 96 HR, 93% SpO2  Pt DOE requiring two standing breaks lasting roughly 1 minute each. Pt coached through purse-lipped breathing, VSS. Pt back to bed after walk with call bell and phone at his side.   [facility] Ruhaan Nordahl Mobility Specialist Mobility Specialist Phone: [phone]

## 2020-08-06 NOTE — Progress Notes (Addendum)
Patient ID: Roberto [name], male   DOB: 1965-09-12, 55 y.o.   MRN: [id number]     Advanced Heart Failure Rounding Note  PCP-Cardiologist: Arvilla Meres, MD   Subjective:   On milrinone 0.125 and diuresing with lasix drip. CVP down but weight not really moving.    He remains on vancomycin/cefepime for RLE cellulitis.  Blood cultures NGTD.    Complaining of cough.   Objective:   Weight Range: (!) 151.2 kg Body mass index is 50.68 kg/m.   Vital Signs:   Temp:  [97.7 F (36.5 C)-98 F (36.7 C)] 97.7 F (36.5 C) (07/25 0529) Pulse Rate:  [81-87] 87 (07/25 0529) Resp:  [18-20] 20 (07/25 0529) BP: (93-112)/(56-77) 93/65 (07/25 0529) SpO2:  [92 %-94 %] 92 % (07/25 0529) Weight:  [151.2 kg] 151.2 kg (07/25 0635) Last BM Date: 08/06/20  Weight change: Filed Weights   08/05/20 0529 08/05/20 0557 08/06/20 0635  Weight: (!) 154.6 kg (!) 151.8 kg (!) 151.2 kg    Intake/Output:   Intake/Output Summary (Last 24 hours) at 08/06/2020 0843 Last data filed at 08/06/2020 0600 Gross per 24 hour  Intake 1305 ml  Output 2450 ml  Net -1145 ml      Physical Exam   CVP 8-9  General:  In the chair. No resp difficulty HEENT: normal Neck: supple. no JVD. Carotids 2+ bilat; no bruits. No lymphadenopathy or thryomegaly appreciated. Cor: PMI nondisplaced. Regular rate & rhythm. No rubs, gallops or murmurs. Lungs: crackles in the bases on room air.  Abdomen: soft, nontender, nondistended. No hepatosplenomegaly. No bruits or masses. Good bowel sounds. Extremities: no cyanosis, clubbing, rash, RLE erythema. Bilateral lower extremity edema R>L Neuro: alert & orientedx3, cranial nerves grossly intact. moves all 4 extremities w/o difficulty. Affect pleasant  Telemetry   Sr/ST with frequent PVCs.  Labs    CBC Recent Labs    08/05/20 0430 08/06/20 0500  WBC 11.8* 12.1*  NEUTROABS 9.2* 9.1*  HGB 11.7* 13.8  HCT 36.8* 43.7  MCV 94.8 94.0  PLT 184 226   Basic Metabolic Panel Recent  Labs    08/05/20 0430 08/06/20 0500  NA 136 137  K 3.3* 3.6  CL 104 103  CO2 23 25  GLUCOSE 115* 124*  BUN 30* 29*  CREATININE 1.96* 2.07*  CALCIUM 7.7* 8.3*  MG 1.9 2.0   Liver Function Tests Recent Labs    08/05/20 0430 08/06/20 0500  AST 21 20  ALT 43 40  ALKPHOS 62 51  BILITOT 1.0 1.1  PROT 5.6* 6.4*  ALBUMIN 1.7* 1.9*   No results for input(s): LIPASE, AMYLASE in the last 72 hours. Cardiac Enzymes No results for input(s): CKTOTAL, CKMB, CKMBINDEX, TROPONINI in the last 72 hours.  BNP: BNP (last 3 results) Recent Labs    07/19/20 0222 07/23/20 1117 07/19/2020 1707  BNP 959.9* 992.0* 1,426.8*    ProBNP (last 3 results) No results for input(s): PROBNP in the last 8760 hours.   D-Dimer No results for input(s): DDIMER in the last 72 hours. Hemoglobin A1C No results for input(s): HGBA1C in the last 72 hours. Fasting Lipid Panel No results for input(s): CHOL, HDL, LDLCALC, TRIG, CHOLHDL, LDLDIRECT in the last 72 hours. Thyroid Function Tests No results for input(s): TSH, T4TOTAL, T3FREE, THYROIDAB in the last 72 hours.  Invalid input(s): FREET3  Other results:   Imaging    VAS [place] LOWER EXTREMITY VENOUS (DVT)  Result Date: 08/05/2020  Lower Venous DVT Study Patient Name:  Roberto [name] [facility]  Date of Exam:   08/05/2020 Medical Rec #: [id number]       Accession #:    [id number] Date of Birth: 07-Oct-1965       Patient Gender: M Patient Age:   7Y Exam Location:  [facility] Procedure:      VAS [place] LOWER EXTREMITY VENOUS (DVT) Referring Phys: [id number] [facility] E[place] --------------------------------------------------------------------------------  Indications: Swelling, Edema, and Erythema.  Limitations: Body habitus, Pitting edema and pain with compression. Comparison Study: Prior negative right LEV done 07/19/2020. Prior negative left                   LEV done 12/01/14 Performing Technologist: Sherren Kerns RVS  Examination Guidelines: A complete evaluation  includes B-mode imaging, spectral Doppler, color Doppler, and power Doppler as needed of all accessible portions of each vessel. Bilateral testing is considered an integral part of a complete examination. Limited examinations for reoccurring indications may be performed as noted. The reflux portion of the exam is performed with the patient in reverse Trendelenburg.  +---------+---------------+---------+-----------+----------+-------------------+ RIGHT    CompressibilityPhasicitySpontaneityPropertiesThrombus Aging      +---------+---------------+---------+-----------+----------+-------------------+ CFV      Full                                         pulsatile waveforms +---------+---------------+---------+-----------+----------+-------------------+ SFJ      Full                                                             +---------+---------------+---------+-----------+----------+-------------------+ FV Prox  Full                                                             +---------+---------------+---------+-----------+----------+-------------------+ FV Mid   Full                                                             +---------+---------------+---------+-----------+----------+-------------------+ FV Distal                                             patent by color and                                                       Doppler             +---------+---------------+---------+-----------+----------+-------------------+ PFV      Full  pulsatile waveforms +---------+---------------+---------+-----------+----------+-------------------+ POP      Full                                                             +---------+---------------+---------+-----------+----------+-------------------+ PTV      Full                                                              +---------+---------------+---------+-----------+----------+-------------------+ PERO                                                  Not well visualized +---------+---------------+---------+-----------+----------+-------------------+   +---------+---------------+---------+-----------+----------+-------------------+ LEFT     CompressibilityPhasicitySpontaneityPropertiesThrombus Aging      +---------+---------------+---------+-----------+----------+-------------------+ CFV      Full                                         pulsatile waveforms +---------+---------------+---------+-----------+----------+-------------------+ SFJ      Full                                                             +---------+---------------+---------+-----------+----------+-------------------+ FV Prox  Full                                                             +---------+---------------+---------+-----------+----------+-------------------+ FV Mid   Full                                                             +---------+---------------+---------+-----------+----------+-------------------+ FV Distal               Yes      Yes                  patent by color and                                                       Doppler             +---------+---------------+---------+-----------+----------+-------------------+ PFV      Full                                                             +---------+---------------+---------+-----------+----------+-------------------+  POP                                                   patent by color and                                                       Doppler             +---------+---------------+---------+-----------+----------+-------------------+ PTV                                                   patent by color     +---------+---------------+---------+-----------+----------+-------------------+  PERO                                                  Not well visualized +---------+---------------+---------+-----------+----------+-------------------+     Summary: RIGHT: - There is no evidence of deep vein thrombosis in the lower extremity. However, portions of this examination were limited- see technologist comments above.  pulsatile waveforms suggestive of fluid overload  LEFT: - There is no evidence of deep vein thrombosis in the lower extremity. However, portions of this examination were limited- see technologist comments above.  Pulsatile waveforms suggestive of fluid overload.  *See table(s) above for measurements and observations.    Preliminary      Medications:     Scheduled Medications:  amiodarone  200 mg Oral BID   apixaban  5 mg Oral BID   Chlorhexidine Gluconate Cloth  6 each Topical Daily   dapagliflozin propanediol  10 mg Oral Daily   digoxin  0.0625 mg Oral Daily   mexiletine  300 mg Oral Q12H   pantoprazole  40 mg Oral Daily   ranolazine  500 mg Oral BID   sodium chloride flush  10-40 mL Intracatheter Q12H    Infusions:  ceFEPime (MAXIPIME) IV 2 g (08/06/20 0554)   furosemide (LASIX) 200 mg in dextrose 5% 100 mL (2mg /mL) infusion 12 mg/hr (08/05/20 2341)   milrinone 0.125 mcg/kg/min (08/06/20 0600)   vancomycin 150 mL/hr at 08/05/20 1535    PRN Medications: acetaminophen **OR** acetaminophen, guaiFENesin-dextromethorphan, sodium chloride flush   Assessment/Plan   1. 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  Echo 12/21 with EF 25-30%, moderate LV dilation. Has medtronic ICD.  Patient sent from office visit 08/11/2020 with cellulitis, as well as acute on chronic combined CHF. Lactate elevated to 3.6. Suspect cellulitis with sepsis in setting of acute/chronic systolic CHF. Nearing end-stage. Not candidate for advanced therapies with size, RV dysfunction, CKD, and ileostomy.  PICC placed, co-ox 51% today. Dont want to increased with ectopy.  - CVP coming down 8-9 in  the chair, but weight not changing.  Most recent d/c weight 334 pounds.  - Continue lasix drip today.  - Continue milrinone 0.125 mcg/kg/min.   - Continue to monitor electrolytes closely and replete as needed to maintain K >4, Mg >2 - Supp K  -  Can hold carvedilol and spironolactone in the setting of decompensated CHF and AKI - Continue digoxin and farxiga 2. RLE cellulitis: Severe sepsis. Patient injured his leg 2 days ago resulting in significant erythema and weeping. No evidence of osteomyelitis or abscess on CT. Started on IV cefepime and vancomycin for cellulitis. -Lactic acid trending down.  - Continue management per primary team 3. History of VT: admission 05/2020 for VT with recurrence during admission, felt to be driving by electrolyte imbalance. Seen by EP and started on amiodarone and mexiletine with no evidence of recurrence on last interrogation 07/19/20. Patient denies ICD shocks - Continue amiodarone and mexiletine - Continue to monitor on telemetry - Continue to monitor electrolytes closely and replete as needed to maintain K >4, Mg >2 - Supp K .  4. History of LV thrombus: on chronic eliquis without complaints of bleeding - Continue Eliquis 5. Atrial flutter s/p ablation: In SR today .  - Continue eliquis.  - Continue amiodarone for rhythm control 6. AKI on CKD stage 3b: Cr 2.7 on admission with variable baseline 1.8-2.5.  Creatinine down to 2  today. 7. Diverticulitis s/p ileostomy   Length of Stay: 2  Tonye BecketAmy Clegg, NP  08/06/2020, 8:43 AM  Advanced Heart Failure Team Pager [phone] (M-F; 7a - 5p)  Please contact [facility] for night-coverage after hours (5p -7a ) and weekends on [email or web address]  Patient seen with NP, agree with the above note.   Some diuresis yesterday, creatinine down 2.4 => 2.07.  Lactate normal though co-ox low in early am at 51%.  He remains on milrinone 0.125 and Lasix gtt 12 mg/hr.  Still with cough.   General: NAD, obese Neck: JVP 12 cm, no  thyromegaly or thyroid nodule.  Lungs: Clear to auscultation bilaterally with normal respiratory effort. CV: Nondisplaced PMI.  Heart regular S1/S2, no S3/S4, no murmur.  1+ edema to knees.  Abdomen: Soft, nontender, no hepatosplenomegaly, no distention.  Skin: Erythema RLE.  Neurologic: Alert and oriented x 3.  Psych: Normal affect. Extremities: No clubbing or cyanosis.  HEENT: Normal.   Vancomycin/cefepime for cellulitis per primary team.   I get a CVP of 12.  Co-ox 51% but lactate normal and having PVCs on milrinone.  - Continue milrinone 0.125, will not increase with improving creatinine and lactate given ventricular ectopy.  - Continue Lasix gtt 12 mg/hr.  Needs more diuresis.   Marca AnconaDalton Mertis Mosher 08/06/2020 12:04 PM

## 2020-08-06 NOTE — Progress Notes (Signed)
PROGRESS NOTE  Roberto [name] PHX:[id number] DOB: 1965/06/06 DOA: 07/20/2020 PCP: [name], MD  HPI/Recap of past 24 hours: Roberto [name] is a 55 y.o. male with history of chronic systolic heart failure last EF measured in December 2021 was 25 to 30% with grade 3 diastolic dysfunction was recently admitted and discharged home on July 21, 2020 for acute on chronic systolic heart failure had followed up with Dr. Gala Romney for his CHF.  Patient states he had gained at least 5 pounds since discharge.  Over the last 2 days patient also noticed increasing redness of his right lower extremity after he scratched his leg on the bed railing.  Had some discharge from a small opening in the leg. Patient was referred to the ER by Dr. Prescott Gum office. In the ER patient blood pressure is in the low normal at times reaching systolic of the 80s.  Afebrile.  Right lower extremity is erythematous with some discharge involving the right below-knee and ankle.  Chest x-ray shows features concerning for CHF.  Labs show BNP of 1400 and creatinine of 2.7 WBC of 15.  Digoxin level is 0.4 COVID test was negative.  Patient admitted for further management.    Today, patient denies any worsening shortness of breath, chest pain.  Reports ongoing cough, nonproductive, denies any fever/chills.    Assessment/Plan: Principal Problem:   Acute on chronic combined systolic and diastolic CHF (congestive heart failure) (HCC) Active Problems:   Chronic anticoagulation   Atypical atrial flutter (HCC)   Chronic kidney disease, stage 3b (HCC)   Cellulitis of right leg   Acute on chronic combined CHF BNP 1400 Chest x-ray with pulmonary edema Echo done on 12/21 showed EF of 25 to 30%, grade 3 diastolic dysfunction, repeat as per cardiology Cardiology on board, started Lasix drip, milrinone, continue to hold Coreg, Aldactone Continue digoxin, Farxiga Strict I's and O's, daily weights Monitor closely, telemetry  Right  lower extremity cellulitis Currently afebrile, with leukocytosis Lactic acid 3.6, will trend CT scan RLE showed no evidence of osteomyelitis, abscess felt soft tissue emphysema BLE Doppler negative for DVT Continue vancomycin, cefepime  Hypokalemia Replace as needed  History of VT/ICD placement Noted 05/2020 with VT Continue amiodarone, mexiletine Monitor electrolytes closely, telemetry  Atrial flutter status post ablation Currently sinus rhythm Continue amiodarone, Eliquis  History of LV thrombus Continue Eliquis  AKI on CKD stage IIIb Baseline creatinine around 2.3 Monitor BMP closely while on diuresis  Diverticulitis s/p ileostomy Monitor  Morbid obesity Lifestyle modification advised     Estimated body mass index is 50.68 kg/m as calculated from the following:   Height as of this encounter: 5\' 8"  (1.727 m).   Weight as of this encounter: 151.2 kg.     Code Status: Full  Family Communication: None at bedside  Disposition Plan: Status is: Inpatient  Remains inpatient appropriate because:Inpatient level of care appropriate due to severity of illness  Dispo: The patient is from: Home              Anticipated d/c is to: Home              Patient currently is not medically stable to d/c.   Difficult to place patient No    Consultants: Cardiology  Procedures: None  Antimicrobials: Cefepime Vancomycin  DVT prophylaxis: Eliquis   Objective: Vitals:   08/06/20 0635 08/06/20 0843 08/06/20 1206 08/06/20 1705  BP:  99/76 (!) 102/53 99/74  Pulse:  82 85 84  Resp:  15 (!) 21 17  Temp:  97.8 F (36.6 C) 98.1 F (36.7 C) 98.4 F (36.9 C)  TempSrc:  Oral Oral Oral  SpO2:  93% 93%   Weight: (!) 151.2 kg     Height:        Intake/Output Summary (Last 24 hours) at 08/06/2020 1820 Last data filed at 08/06/2020 1700 Gross per 24 hour  Intake 2632.37 ml  Output 2800 ml  Net -167.63 ml   Filed Weights   08/05/20 0529 08/05/20 0557 08/06/20 0635   Weight: (!) 154.6 kg (!) 151.8 kg (!) 151.2 kg    Exam: General: NAD Cardiovascular: S1, S2 present Respiratory: Bibasilar crackles Abdomen: Soft, nontender, nondistended, bowel sounds present, noted ileostomy Musculoskeletal: 3+ bilateral pedal edema noted Skin: RLE with erythema, skin tear to anterior tibia, noted clear drainage Psychiatry: Normal mood     Data Reviewed: CBC: Recent Labs  Lab 08/12/2020 1707 08/04/20 1505 08/05/20 0430 08/06/20 0500  WBC 15.0* 15.0* 11.8* 12.1*  NEUTROABS 11.9* 12.4* 9.2* 9.1*  HGB 15.1 14.7 11.7* 13.8  HCT 48.3 45.8 36.8* 43.7  MCV 96.2 95.2 94.8 94.0  PLT 237 202 184 226   Basic Metabolic Panel: Recent Labs  Lab 07/17/2020 1707 08/04/20 1505 08/05/20 0430 08/06/20 0500  NA 136 137 136 137  K 4.2 4.0 3.3* 3.6  CL 101 103 104 103  CO2 24 23 23 25   GLUCOSE 135* 200* 115* 124*  BUN 32* 33* 30* 29*  CREATININE 2.72* 2.40* 1.96* 2.07*  CALCIUM 8.6* 8.5* 7.7* 8.3*  MG  --   --  1.9 2.0   GFR: Estimated Creatinine Clearance: 58.6 mL/min (A) (by C-G formula based on SCr of 2.07 mg/dL (H)). Liver Function Tests: Recent Labs  Lab 08/04/20 1505 08/05/20 0430 08/06/20 0500  AST 26 21 20   ALT 57* 43 40  ALKPHOS 56 62 51  BILITOT 1.2 1.0 1.1  PROT 6.5 5.6* 6.4*  ALBUMIN 2.1* 1.7* 1.9*   No results for input(s): LIPASE, AMYLASE in the last 168 hours. No results for input(s): AMMONIA in the last 168 hours. Coagulation Profile: No results for input(s): INR, PROTIME in the last 168 hours. Cardiac Enzymes: No results for input(s): CKTOTAL, CKMB, CKMBINDEX, TROPONINI in the last 168 hours. BNP (last 3 results) No results for input(s): PROBNP in the last 8760 hours. HbA1C: No results for input(s): HGBA1C in the last 72 hours. CBG: No results for input(s): GLUCAP in the last 168 hours. Lipid Profile: No results for input(s): CHOL, HDL, LDLCALC, TRIG, CHOLHDL, LDLDIRECT in the last 72 hours. Thyroid Function Tests: No results for  input(s): TSH, T4TOTAL, FREET4, T3FREE, THYROIDAB in the last 72 hours. Anemia Panel: No results for input(s): VITAMINB12, FOLATE, FERRITIN, TIBC, IRON, RETICCTPCT in the last 72 hours. Urine analysis:    Component Value Date/Time   COLORURINE STRAW (A) 06/01/2020 0909   APPEARANCEUR CLEAR 06/01/2020 0909   LABSPEC 1.003 (L) 06/01/2020 0909   LABSPEC 1.025 03/18/2017 1337   PHURINE 5.0 06/01/2020 0909   GLUCOSEU >=500 (A) 06/01/2020 0909   HGBUR SMALL (A) 06/01/2020 0909   BILIRUBINUR NEGATIVE 06/01/2020 0909   BILIRUBINUR negative 03/18/2017 1337   BILIRUBINUR neg 12/17/2015 0944   KETONESUR NEGATIVE 06/01/2020 0909   PROTEINUR 100 (A) 06/01/2020 0909   UROBILINOGEN negative 12/17/2015 0944   UROBILINOGEN 0.2 12/16/2011 1208   NITRITE NEGATIVE 06/01/2020 0909   LEUKOCYTESUR NEGATIVE 06/01/2020 0909   Sepsis Labs: @LABRCNTIP (procalcitonin:4,lacticidven:4)  ) Recent Results (from the past 240  hour(s))  Resp Panel by RT-PCR (Flu A&B, Covid) Nasopharyngeal Swab     Status: None   Collection Time: 08/04/20  2:55 AM   Specimen: Nasopharyngeal Swab; Nasopharyngeal(NP) swabs in vial transport medium  Result Value Ref Range Status   SARS Coronavirus 2 by RT PCR NEGATIVE NEGATIVE Final    Comment: (NOTE) SARS-CoV-2 target nucleic acids are NOT DETECTED.  The SARS-CoV-2 RNA is generally detectable in upper respiratory specimens during the acute phase of infection. The lowest concentration of SARS-CoV-2 viral copies this assay can detect is 138 copies/mL. A negative result does not preclude SARS-Cov-2 infection and should not be used as the sole basis for treatment or other patient management decisions. A negative result may occur with  improper specimen collection/handling, submission of specimen other than nasopharyngeal swab, presence of viral mutation(s) within the areas targeted by this assay, and inadequate number of viral copies(<138 copies/mL). A negative result must be  combined with clinical observations, patient history, and epidemiological information. The expected result is Negative.  Fact Sheet for Patients:  BloggerCourse.com  Fact Sheet for Healthcare Providers:  SeriousBroker.it  This test is no t yet approved or cleared by the [place] FDA and  has been authorized for detection and/or diagnosis of SARS-CoV-2 by FDA under an Emergency Use Authorization (EUA). This EUA will remain  in effect (meaning this test can be used) for the duration of the COVID-19 declaration under Section 564(b)(1) of the Act, 21 U.S.C.section 360bbb-3(b)(1), unless the authorization is terminated  or revoked sooner.       Influenza A by PCR NEGATIVE NEGATIVE Final   Influenza B by PCR NEGATIVE NEGATIVE Final    Comment: (NOTE) The Xpert Xpress SARS-CoV-2/FLU/RSV plus assay is intended as an aid in the diagnosis of influenza from Nasopharyngeal swab specimens and should not be used as a sole basis for treatment. Nasal washings and aspirates are unacceptable for Xpert Xpress SARS-CoV-2/FLU/RSV testing.  Fact Sheet for Patients: BloggerCourse.com  Fact Sheet for Healthcare Providers: SeriousBroker.it  This test is not yet approved or cleared by the [place] FDA and has been authorized for detection and/or diagnosis of SARS-CoV-2 by FDA under an Emergency Use Authorization (EUA). This EUA will remain in effect (meaning this test can be used) for the duration of the COVID-19 declaration under Section 564(b)(1) of the Act, 21 U.S.C. section 360bbb-3(b)(1), unless the authorization is terminated or revoked.  Performed at [facility], [address] [place], [place] [address]   Culture, blood (routine x 2)     Status: None (Preliminary result)   Collection Time: 08/04/20  6:05 AM   Specimen: BLOOD LEFT HAND  Result Value Ref Range Status    Specimen Description BLOOD LEFT HAND  Final   Special Requests   Final    BOTTLES DRAWN AEROBIC AND ANAEROBIC Blood Culture results may not be optimal due to an inadequate volume of blood received in culture bottles   Culture   Final    NO GROWTH 2 DAYS Performed at [facility], [address], [place], [place] [address]    Report Status PENDING  Incomplete  Culture, blood (routine x 2)     Status: None (Preliminary result)   Collection Time: 08/04/20  6:10 AM   Specimen: BLOOD RIGHT HAND  Result Value Ref Range Status   Specimen Description BLOOD RIGHT HAND  Final   Special Requests   Final    BOTTLES DRAWN AEROBIC AND ANAEROBIC Blood Culture results may  not be optimal due to an inadequate volume of blood received in culture bottles   Culture   Final    NO GROWTH 2 DAYS Performed at [facility], [address], [place], [place] [address]    Report Status PENDING  Incomplete      Studies: No results found.  Scheduled Meds:  amiodarone  200 mg Oral BID   apixaban  5 mg Oral BID   Chlorhexidine Gluconate Cloth  6 each Topical Daily   dapagliflozin propanediol  10 mg Oral Daily   digoxin  0.0625 mg Oral Daily   mexiletine  300 mg Oral Q12H   pantoprazole  40 mg Oral Daily   potassium chloride  40 mEq Oral BID   ranolazine  500 mg Oral BID   sodium chloride flush  10-40 mL Intracatheter Q12H    Continuous Infusions:  ceFEPime (MAXIPIME) IV 2 g (08/06/20 1749)   furosemide (LASIX) 200 mg in dextrose 5% 100 mL (2mg /mL) infusion 500 mg/hr (08/06/20 1700)   milrinone 0.125 mcg/kg/min (08/06/20 1700)   vancomycin Stopped (08/06/20 1622)     LOS: 2 days     08/08/20, MD [facility]  If 7PM-7AM, please contact night-coverage [email or web address] 08/06/2020, 6:20 PM

## 2020-08-07 DIAGNOSIS — I484 Atypical atrial flutter: Secondary | ICD-10-CM | POA: Diagnosis not present

## 2020-08-07 DIAGNOSIS — L03115 Cellulitis of right lower limb: Secondary | ICD-10-CM | POA: Diagnosis not present

## 2020-08-07 DIAGNOSIS — Z7901 Long term (current) use of anticoagulants: Secondary | ICD-10-CM | POA: Diagnosis not present

## 2020-08-07 DIAGNOSIS — I5043 Acute on chronic combined systolic (congestive) and diastolic (congestive) heart failure: Secondary | ICD-10-CM | POA: Diagnosis not present

## 2020-08-07 LAB — CBC WITH DIFFERENTIAL/PLATELET
Abs Immature Granulocytes: 0.69 10*3/uL — ABNORMAL HIGH (ref 0.00–0.07)
Basophils Absolute: 0.1 10*3/uL (ref 0.0–0.1)
Basophils Relative: 1 %
Eosinophils Absolute: 0.2 10*3/uL (ref 0.0–0.5)
Eosinophils Relative: 2 %
HCT: 44.7 % (ref 39.0–52.0)
Hemoglobin: 14.5 g/dL (ref 13.0–17.0)
Immature Granulocytes: 5 %
Lymphocytes Relative: 8 %
Lymphs Abs: 1.2 10*3/uL (ref 0.7–4.0)
MCH: 30.4 pg (ref 26.0–34.0)
MCHC: 32.4 g/dL (ref 30.0–36.0)
MCV: 93.7 fL (ref 80.0–100.0)
Monocytes Absolute: 1.3 10*3/uL — ABNORMAL HIGH (ref 0.1–1.0)
Monocytes Relative: 9 %
Neutro Abs: 11.2 10*3/uL — ABNORMAL HIGH (ref 1.7–7.7)
Neutrophils Relative %: 75 %
Platelets: 226 10*3/uL (ref 150–400)
RBC: 4.77 MIL/uL (ref 4.22–5.81)
RDW: 15.3 % (ref 11.5–15.5)
WBC: 14.7 10*3/uL — ABNORMAL HIGH (ref 4.0–10.5)
nRBC: 0 % (ref 0.0–0.2)

## 2020-08-07 LAB — COOXEMETRY PANEL
Carboxyhemoglobin: 1.5 % (ref 0.5–1.5)
Carboxyhemoglobin: 1.8 % — ABNORMAL HIGH (ref 0.5–1.5)
Methemoglobin: 0.8 % (ref 0.0–1.5)
Methemoglobin: 1 % (ref 0.0–1.5)
O2 Saturation: 42 %
O2 Saturation: 41.5 %
Total hemoglobin: 14.5 g/dL (ref 12.0–16.0)
Total hemoglobin: 12.8 g/dL (ref 12.0–16.0)

## 2020-08-07 LAB — COMPREHENSIVE METABOLIC PANEL
ALT: 34 U/L (ref 0–44)
AST: 20 U/L (ref 15–41)
Albumin: 1.9 g/dL — ABNORMAL LOW (ref 3.5–5.0)
Alkaline Phosphatase: 52 U/L (ref 38–126)
Anion gap: 10 (ref 5–15)
BUN: 26 mg/dL — ABNORMAL HIGH (ref 6–20)
CO2: 23 mmol/L (ref 22–32)
Calcium: 8.1 mg/dL — ABNORMAL LOW (ref 8.9–10.3)
Chloride: 102 mmol/L (ref 98–111)
Creatinine, Ser: 1.95 mg/dL — ABNORMAL HIGH (ref 0.61–1.24)
GFR, Estimated: 40 mL/min — ABNORMAL LOW (ref >60)
Glucose, Bld: 130 mg/dL — ABNORMAL HIGH (ref 70–99)
Potassium: 3.6 mmol/L (ref 3.5–5.1)
Sodium: 135 mmol/L (ref 135–145)
Total Bilirubin: 1.3 mg/dL — ABNORMAL HIGH (ref 0.3–1.2)
Total Protein: 6.5 g/dL (ref 6.5–8.1)

## 2020-08-07 LAB — BASIC METABOLIC PANEL
Anion gap: 10 (ref 5–15)
BUN: 27 mg/dL — ABNORMAL HIGH (ref 6–20)
CO2: 21 mmol/L — ABNORMAL LOW (ref 22–32)
Calcium: 8 mg/dL — ABNORMAL LOW (ref 8.9–10.3)
Chloride: 100 mmol/L (ref 98–111)
Creatinine, Ser: 2 mg/dL — ABNORMAL HIGH (ref 0.61–1.24)
GFR, Estimated: 39 mL/min — ABNORMAL LOW (ref >60)
Glucose, Bld: 229 mg/dL — ABNORMAL HIGH (ref 70–99)
Potassium: 3.4 mmol/L — ABNORMAL LOW (ref 3.5–5.1)
Sodium: 131 mmol/L — ABNORMAL LOW (ref 135–145)

## 2020-08-07 LAB — MAGNESIUM: Magnesium: 2 mg/dL (ref 1.7–2.4)

## 2020-08-07 MED ORDER — AMIODARONE HCL IN DEXTROSE 360-4.14 MG/200ML-% IV SOLN
30.0000 mg/h | INTRAVENOUS | Status: DC
  Administered 2020-08-07 – 2020-08-14 (×14): 30 mg/h via INTRAVENOUS
  Filled 2020-08-07 (×14): qty 200

## 2020-08-07 MED ORDER — POTASSIUM CHLORIDE CRYS ER 20 MEQ PO TBCR
40.0000 meq | EXTENDED_RELEASE_TABLET | Freq: Two times a day (BID) | ORAL | Status: AC
  Administered 2020-08-07 (×2): 40 meq via ORAL
  Filled 2020-08-07 (×2): qty 2

## 2020-08-07 MED ORDER — POTASSIUM CHLORIDE CRYS ER 20 MEQ PO TBCR
40.0000 meq | EXTENDED_RELEASE_TABLET | Freq: Once | ORAL | Status: AC
  Administered 2020-08-07: 40 meq via ORAL
  Filled 2020-08-07: qty 2

## 2020-08-07 MED ORDER — METOLAZONE 5 MG PO TABS
2.5000 mg | ORAL_TABLET | Freq: Once | ORAL | Status: AC
  Administered 2020-08-07: 2.5 mg via ORAL
  Filled 2020-08-07: qty 1

## 2020-08-07 MED ORDER — BENZONATATE 100 MG PO CAPS
200.0000 mg | ORAL_CAPSULE | Freq: Three times a day (TID) | ORAL | Status: DC | PRN
  Administered 2020-08-07: 200 mg via ORAL
  Filled 2020-08-07: qty 2

## 2020-08-07 NOTE — Progress Notes (Signed)
Pt and family requesting pitcher of ice water, after receiving fluids from another nurse. Both were educated about fluid restriction and importance of following this due to pt's heart failure.

## 2020-08-07 NOTE — Plan of Care (Signed)
  Problem: Education: Goal: Knowledge of General Education information will improve Description: Including pain rating scale, medication(s)/side effects and non-pharmacologic comfort measures Outcome: Progressing   Problem: Health Behavior/Discharge Planning: Goal: Ability to manage health-related needs will improve Outcome: Progressing   Problem: Clinical Measurements: Goal: Ability to maintain clinical measurements within normal limits will improve Outcome: Progressing Goal: Will remain free from infection Outcome: Progressing Goal: Diagnostic test results will improve Outcome: Progressing Goal: Respiratory complications will improve Outcome: Progressing Goal: Cardiovascular complication will be avoided Outcome: Progressing   Problem: Activity: Goal: Risk for activity intolerance will decrease Outcome: Progressing   Problem: Nutrition: Goal: Adequate nutrition will be maintained Outcome: Progressing   Problem: Coping: Goal: Level of anxiety will decrease Outcome: Progressing   Problem: Nutrition: Goal: Adequate nutrition will be maintained Outcome: Progressing   Problem: Coping: Goal: Level of anxiety will decrease Outcome: Progressing   Problem: Elimination: Goal: Will not experience complications related to bowel motility Outcome: Progressing Goal: Will not experience complications related to urinary retention Outcome: Progressing   Problem: Pain Managment: Goal: General experience of comfort will improve Outcome: Progressing   Problem: Elimination: Goal: Will not experience complications related to bowel motility Outcome: Progressing Goal: Will not experience complications related to urinary retention Outcome: Progressing   Problem: Pain Managment: Goal: General experience of comfort will improve Outcome: Progressing   Problem: Safety: Goal: Ability to remain free from injury will improve Outcome: Progressing   Problem: Skin Integrity: Goal: Risk  for impaired skin integrity will decrease Outcome: Progressing   Problem: Education: Goal: Ability to demonstrate management of disease process will improve Outcome: Progressing Goal: Ability to verbalize understanding of medication therapies will improve Outcome: Progressing Goal: Individualized Educational Video(s) Outcome: Progressing   Problem: Activity: Goal: Capacity to carry out activities will improve Outcome: Progressing   Problem: Cardiac: Goal: Ability to achieve and maintain adequate cardiopulmonary perfusion will improve Outcome: Progressing

## 2020-08-07 NOTE — Progress Notes (Signed)
PROGRESS NOTE  Roberto [name]:[id number] DOB: [birth year] DOA: 08/04/2020 PCP: [name], MD  HPI/Recap of past 24 hours: Roberto [name] is a 55 y.o. male with history of chronic systolic heart failure last EF measured in December 2021 was 25 to 30% with grade 3 diastolic dysfunction was recently admitted and discharged home on July 21, 2020 for acute on chronic systolic heart failure had followed up with Dr. Gala Romney for his CHF.  Patient states he had gained at least 5 pounds since discharge.  Over the last 2 days patient also noticed increasing redness of his right lower extremity after he scratched his leg on the bed railing.  Had some discharge from a small opening in the leg. Patient was referred to the ER by Dr. Prescott Gum office. In the ER patient blood pressure is in the low normal at times reaching systolic of the 80s.  Afebrile.  Right lower extremity is erythematous with some discharge involving the right below-knee and ankle.  Chest x-ray shows features concerning for CHF.  Labs show BNP of 1400 and creatinine of 2.7 WBC of 15.  Digoxin level is 0.4 COVID test was negative.  Patient admitted for further management.    Today, patient continues to complain of cough, somewhat dry.  Denies any worsening shortness of breath, chest pain, abdominal pain, nausea/vomiting, fever/chills.    Assessment/Plan: Principal Problem:   Acute on chronic combined systolic and diastolic CHF (congestive heart failure) (HCC) Active Problems:   Chronic anticoagulation   Atypical atrial flutter (HCC)   Chronic kidney disease, stage 3b (HCC)   Cellulitis of right leg   Acute on chronic combined CHF BNP 1400 Chest x-ray with pulmonary edema Echo done on 12/21 showed EF of 25 to 30%, grade 3 diastolic dysfunction Cardiology on board, started Lasix drip, milrinone, continue to hold Coreg, Aldactone Continue digoxin, Farxiga Strict I's and O's, daily weights Monitor closely,  telemetry  Right lower extremity cellulitis Currently afebrile, with leukocytosis Lactic acid 3.6-->1.5 CT scan RLE showed no evidence of osteomyelitis, abscess felt soft tissue emphysema BLE Doppler negative for DVT Continue vancomycin, cefepime  Hypokalemia Replace as needed  History of VT/ICD placement Noted 05/2020 with VT Continue amiodarone drip, mexiletine Monitor electrolytes closely, telemetry  Atrial flutter status post ablation Continue amiodarone, Eliquis  History of LV thrombus Continue Eliquis  AKI on CKD stage IIIb Baseline creatinine around 2.3 Monitor BMP closely while on diuresis  Diverticulitis s/p ileostomy Monitor  Morbid obesity Lifestyle modification advised     Estimated body mass index is 50.82 kg/m as calculated from the following:   Height as of this encounter: 5\' 8"  (1.727 m).   Weight as of this encounter: 151.6 kg.     Code Status: Full  Family Communication: None at bedside  Disposition Plan: Status is: Inpatient  Remains inpatient appropriate because:Inpatient level of care appropriate due to severity of illness  Dispo: The patient is from: Home              Anticipated d/c is to: Home              Patient currently is not medically stable to d/c.   Difficult to place patient No    Consultants: Cardiology  Procedures: None  Antimicrobials: Cefepime Vancomycin  DVT prophylaxis: Eliquis   Objective: Vitals:   08/07/20 0500 08/07/20 0848 08/07/20 1016 08/07/20 1515  BP:   (!) 103/54   Pulse:  98    Resp:   18  Temp:      TempSrc:      SpO2:   98% 94%  Weight: (!) 151.6 kg     Height:        Intake/Output Summary (Last 24 hours) at 08/07/2020 1838 Last data filed at 08/07/2020 1739 Gross per 24 hour  Intake 2612.54 ml  Output 4600 ml  Net -1987.46 ml   Filed Weights   08/05/20 0557 08/06/20 0635 08/07/20 0500  Weight: (!) 151.8 kg (!) 151.2 kg (!) 151.6 kg    Exam: General: NAD Cardiovascular:  S1, S2 present Respiratory: Bibasilar crackles Abdomen: Soft, nontender, nondistended, bowel sounds present, noted ileostomy Musculoskeletal: 3+ bilateral pedal edema noted Skin: RLE with erythema, skin tear to anterior tibia, noted clear drainage Psychiatry: Normal mood     Data Reviewed: CBC: Recent Labs  Lab 08/10/2020 1707 08/04/20 1505 08/05/20 0430 08/06/20 0500 08/07/20 0550  WBC 15.0* 15.0* 11.8* 12.1* 14.7*  NEUTROABS 11.9* 12.4* 9.2* 9.1* 11.2*  HGB 15.1 14.7 11.7* 13.8 14.5  HCT 48.3 45.8 36.8* 43.7 44.7  MCV 96.2 95.2 94.8 94.0 93.7  PLT 237 202 184 226 226   Basic Metabolic Panel: Recent Labs  Lab 07/14/2020 1707 08/04/20 1505 08/05/20 0430 08/06/20 0500 08/07/20 0550  NA 136 137 136 137 135  K 4.2 4.0 3.3* 3.6 3.6  CL 101 103 104 103 102  CO2 24 23 23 25 23   GLUCOSE 135* 200* 115* 124* 130*  BUN 32* 33* 30* 29* 26*  CREATININE 2.72* 2.40* 1.96* 2.07* 1.95*  CALCIUM 8.6* 8.5* 7.7* 8.3* 8.1*  MG  --   --  1.9 2.0 2.0   GFR: Estimated Creatinine Clearance: 62.3 mL/min (A) (by C-G formula based on SCr of 1.95 mg/dL (H)). Liver Function Tests: Recent Labs  Lab 08/04/20 1505 08/05/20 0430 08/06/20 0500 08/07/20 0550  AST 26 21 20 20   ALT 57* 43 40 34  ALKPHOS 56 62 51 52  BILITOT 1.2 1.0 1.1 1.3*  PROT 6.5 5.6* 6.4* 6.5  ALBUMIN 2.1* 1.7* 1.9* 1.9*   No results for input(s): LIPASE, AMYLASE in the last 168 hours. No results for input(s): AMMONIA in the last 168 hours. Coagulation Profile: No results for input(s): INR, PROTIME in the last 168 hours. Cardiac Enzymes: No results for input(s): CKTOTAL, CKMB, CKMBINDEX, TROPONINI in the last 168 hours. BNP (last 3 results) No results for input(s): PROBNP in the last 8760 hours. HbA1C: No results for input(s): HGBA1C in the last 72 hours. CBG: No results for input(s): GLUCAP in the last 168 hours. Lipid Profile: No results for input(s): CHOL, HDL, LDLCALC, TRIG, CHOLHDL, LDLDIRECT in the last 72  hours. Thyroid Function Tests: No results for input(s): TSH, T4TOTAL, FREET4, T3FREE, THYROIDAB in the last 72 hours. Anemia Panel: No results for input(s): VITAMINB12, FOLATE, FERRITIN, TIBC, IRON, RETICCTPCT in the last 72 hours. Urine analysis:    Component Value Date/Time   COLORURINE STRAW (A) 06/01/2020 0909   APPEARANCEUR CLEAR 06/01/2020 0909   LABSPEC 1.003 (L) 06/01/2020 0909   LABSPEC 1.025 03/18/2017 1337   PHURINE 5.0 06/01/2020 0909   GLUCOSEU >=500 (A) 06/01/2020 0909   HGBUR SMALL (A) 06/01/2020 0909   BILIRUBINUR NEGATIVE 06/01/2020 0909   BILIRUBINUR negative 03/18/2017 1337   BILIRUBINUR neg 12/17/2015 0944   KETONESUR NEGATIVE 06/01/2020 0909   PROTEINUR 100 (A) 06/01/2020 0909   UROBILINOGEN negative 12/17/2015 0944   UROBILINOGEN 0.2 12/16/2011 1208   NITRITE NEGATIVE 06/01/2020 0909   LEUKOCYTESUR NEGATIVE 06/01/2020 06/03/2020  Sepsis Labs: @LABRCNTIP (procalcitonin:4,lacticidven:4)  ) Recent Results (from the past 240 hour(s))  Resp Panel by RT-PCR (Flu A&B, Covid) Nasopharyngeal Swab     Status: None   Collection Time: 08/04/20  2:55 AM   Specimen: Nasopharyngeal Swab; Nasopharyngeal(NP) swabs in vial transport medium  Result Value Ref Range Status   SARS Coronavirus 2 by RT PCR NEGATIVE NEGATIVE Final    Comment: (NOTE) SARS-CoV-2 target nucleic acids are NOT DETECTED.  The SARS-CoV-2 RNA is generally detectable in upper respiratory specimens during the acute phase of infection. The lowest concentration of SARS-CoV-2 viral copies this assay can detect is 138 copies/mL. A negative result does not preclude SARS-Cov-2 infection and should not be used as the sole basis for treatment or other patient management decisions. A negative result may occur with  improper specimen collection/handling, submission of specimen other than nasopharyngeal swab, presence of viral mutation(s) within the areas targeted by this assay, and inadequate number of  viral copies(<138 copies/mL). A negative result must be combined with clinical observations, patient history, and epidemiological information. The expected result is Negative.  Fact Sheet for Patients:  BloggerCourse.com  Fact Sheet for Healthcare Providers:  SeriousBroker.it  This test is no t yet approved or cleared by the [place] FDA and  has been authorized for detection and/or diagnosis of SARS-CoV-2 by FDA under an Emergency Use Authorization (EUA). This EUA will remain  in effect (meaning this test can be used) for the duration of the COVID-19 declaration under Section 564(b)(1) of the Act, 21 U.S.C.section 360bbb-3(b)(1), unless the authorization is terminated  or revoked sooner.       Influenza A by PCR NEGATIVE NEGATIVE Final   Influenza B by PCR NEGATIVE NEGATIVE Final    Comment: (NOTE) The Xpert Xpress SARS-CoV-2/FLU/RSV plus assay is intended as an aid in the diagnosis of influenza from Nasopharyngeal swab specimens and should not be used as a sole basis for treatment. Nasal washings and aspirates are unacceptable for Xpert Xpress SARS-CoV-2/FLU/RSV testing.  Fact Sheet for Patients: BloggerCourse.com  Fact Sheet for Healthcare Providers: SeriousBroker.it  This test is not yet approved or cleared by the [place] FDA and has been authorized for detection and/or diagnosis of SARS-CoV-2 by FDA under an Emergency Use Authorization (EUA). This EUA will remain in effect (meaning this test can be used) for the duration of the COVID-19 declaration under Section 564(b)(1) of the Act, 21 U.S.C. section 360bbb-3(b)(1), unless the authorization is terminated or revoked.  Performed at [facility], [address] [place], [place] [address]   Culture, blood (routine x 2)     Status: None (Preliminary result)   Collection Time: 08/04/20  6:05 AM    Specimen: BLOOD LEFT HAND  Result Value Ref Range Status   Specimen Description BLOOD LEFT HAND  Final   Special Requests   Final    BOTTLES DRAWN AEROBIC AND ANAEROBIC Blood Culture results may not be optimal due to an inadequate volume of blood received in culture bottles   Culture   Final    NO GROWTH 3 DAYS Performed at [facility], [address], [place], [place] [address]    Report Status PENDING  Incomplete  Culture, blood (routine x 2)     Status: None (Preliminary result)   Collection Time: 08/04/20  6:10 AM   Specimen: BLOOD RIGHT HAND  Result Value Ref Range Status   Specimen Description BLOOD RIGHT HAND  Final   Special Requests   Final  BOTTLES DRAWN AEROBIC AND ANAEROBIC Blood Culture results may not be optimal due to an inadequate volume of blood received in culture bottles   Culture   Final    NO GROWTH 3 DAYS Performed at [facility], [address] [place], [place] [address]    Report Status PENDING  Incomplete      Studies: No results found.  Scheduled Meds:  apixaban  5 mg Oral BID   Chlorhexidine Gluconate Cloth  6 each Topical Daily   dapagliflozin propanediol  10 mg Oral Daily   digoxin  0.0625 mg Oral Daily   guaiFENesin  600 mg Oral BID   mexiletine  300 mg Oral Q12H   pantoprazole  40 mg Oral Daily   potassium chloride  40 mEq Oral BID   ranolazine  500 mg Oral BID   sodium chloride flush  10-40 mL Intracatheter Q12H    Continuous Infusions:  amiodarone     ceFEPime (MAXIPIME) IV Stopped (08/07/20 1528)   furosemide (LASIX) 200 mg in dextrose 5% 100 mL (2mg /mL) infusion 12 mg/hr (08/07/20 1627)   milrinone 0.2503 mcg/kg/min (08/07/20 1627)   vancomycin Stopped (08/07/20 1439)     LOS: 3 days     08/09/20, MD [facility]  If 7PM-7AM, please contact night-coverage [email or web address] 08/07/2020, 6:38 PM

## 2020-08-07 NOTE — Progress Notes (Addendum)
Patient ID: Roberto [name], male   DOB: [birth year], 55 y.o.   MRN: [id number]     Advanced Heart Failure Rounding Note  PCP-Cardiologist: Arvilla Meres, MD   Subjective:   Co-ox 51% > 42% on milrinone 0.125   Diuresing well with lasix drip. Unable to get accurate CVP, IV team coming to evaluate line. Not much change in weight. Continues with cough. No dyspnea.   He remains on vancomycin/cefepime for RLE cellulitis.  Blood cultures NGTD.     Objective:   Weight Range: (!) 151.6 kg Body mass index is 50.82 kg/m.   Vital Signs:   Temp:  [97.8 F (36.6 C)-98.4 F (36.9 C)] 97.8 F (36.6 C) (07/26 0346) Pulse Rate:  [79-98] 98 (07/26 0848) Resp:  [17-22] 20 (07/26 0346) BP: (99-136)/(53-84) 103/83 (07/26 0346) SpO2:  [92 %-99 %] 94 % (07/26 0346) Weight:  [151.6 kg] 151.6 kg (07/26 0500) Last BM Date: 08/06/20  Weight change: Filed Weights   08/05/20 0557 08/06/20 0635 08/07/20 0500  Weight: (!) 151.8 kg (!) 151.2 kg (!) 151.6 kg    Intake/Output:   Intake/Output Summary (Last 24 hours) at 08/07/2020 0919 Last data filed at 08/07/2020 0848 Gross per 24 hour  Intake 3304.97 ml  Output 3500 ml  Net -195.03 ml      Physical Exam   General:  Lying in bed. No resp difficulty. Comfortable on RA. HEENT: normal Neck: supple. JVD difficult to assess. Carotids 2+ bilat; no bruits. No lymphadenopathy or thryomegaly appreciated. Cor: PMI nondisplaced. Regular rhythm with frequent ectopy. No rubs, gallops or murmurs. Lungs: crackles in the bases Abdomen: soft, nontender, nondistended. No hepatosplenomegaly. No bruits or masses. Good bowel sounds. Extremities: no cyanosis, clubbing, rash, RLE erythema. Bilateral lower extremity edema R>L. Erythema RLE. Neuro: alert & orientedx3, cranial nerves grossly intact. moves all 4 extremities w/o difficulty. Affect pleasant  Telemetry   Sr/ST with frequent PVCs (20-30/min).  Labs    CBC Recent Labs    08/06/20 0500  08/07/20 0550  WBC 12.1* 14.7*  NEUTROABS 9.1* 11.2*  HGB 13.8 14.5  HCT 43.7 44.7  MCV 94.0 93.7  PLT 226 226   Basic Metabolic Panel Recent Labs    73/42/87 0500 08/07/20 0550  NA 137 135  K 3.6 3.6  CL 103 102  CO2 25 23  GLUCOSE 124* 130*  BUN 29* 26*  CREATININE 2.07* 1.95*  CALCIUM 8.3* 8.1*  MG 2.0 2.0   Liver Function Tests Recent Labs    08/06/20 0500 08/07/20 0550  AST 20 20  ALT 40 34  ALKPHOS 51 52  BILITOT 1.1 1.3*  PROT 6.4* 6.5  ALBUMIN 1.9* 1.9*   No results for input(s): LIPASE, AMYLASE in the last 72 hours. Cardiac Enzymes No results for input(s): CKTOTAL, CKMB, CKMBINDEX, TROPONINI in the last 72 hours.  BNP: BNP (last 3 results) Recent Labs    07/19/20 0222 07/23/20 1117 07/22/2020 1707  BNP 959.9* 992.0* 1,426.8*    ProBNP (last 3 results) No results for input(s): PROBNP in the last 8760 hours.   D-Dimer No results for input(s): DDIMER in the last 72 hours. Hemoglobin A1C No results for input(s): HGBA1C in the last 72 hours. Fasting Lipid Panel No results for input(s): CHOL, HDL, LDLCALC, TRIG, CHOLHDL, LDLDIRECT in the last 72 hours. Thyroid Function Tests No results for input(s): TSH, T4TOTAL, T3FREE, THYROIDAB in the last 72 hours.  Invalid input(s): FREET3  Other results:   Imaging    No results found.  Medications:     Scheduled Medications:  amiodarone  200 mg Oral BID   apixaban  5 mg Oral BID   Chlorhexidine Gluconate Cloth  6 each Topical Daily   dapagliflozin propanediol  10 mg Oral Daily   digoxin  0.0625 mg Oral Daily   guaiFENesin  600 mg Oral BID   mexiletine  300 mg Oral Q12H   pantoprazole  40 mg Oral Daily   ranolazine  500 mg Oral BID   sodium chloride flush  10-40 mL Intracatheter Q12H    Infusions:  ceFEPime (MAXIPIME) IV 200 mL/hr at 08/07/20 0600   furosemide (LASIX) 200 mg in dextrose 5% 100 mL (2mg /mL) infusion 12 mg/hr (08/07/20 0600)   milrinone 0.125 mcg/kg/min (08/07/20 0600)    vancomycin Stopped (08/06/20 1622)    PRN Medications: acetaminophen **OR** acetaminophen, guaiFENesin-dextromethorphan, sodium chloride flush   Assessment/Plan   1. 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  Echo 12/21 with EF 25-30%, moderate LV dilation. Has medtronic ICD.  Patient sent from office visit 2020/09/01 with cellulitis, as well as acute on chronic combined CHF. Lactate elevated to 3.6. Suspect cellulitis with sepsis in setting of acute/chronic systolic CHF. Nearing end-stage. Not candidate for advanced therapies with size, RV dysfunction, CKD, and ileostomy.   -PICC placed, co-ox 51% > 42%. Will recheck co-ox. Lactate normal yesterday. Concerned about increasing milrinone d/t ectopy. -IV team coming to assess PICC line. Difficulty obtaining CVP. Still appears volume up. Diuresing well but weight not changing.  Strict Is/Os. In 3.4L yesterday. -Continue furosemide gtt at 12/hr -Continue milrinone 0.125 mcg/kg/min.   -Continue to monitor electrolytes closely and replete as needed to maintain K >4, Mg >2 - Can hold carvedilol and spironolactone in the setting of decompensated CHF and AKI - Continue digoxin and farxiga 2. RLE cellulitis:  -Severe sepsis. Patient injured his leg 2 days ago resulting in significant erythema and weeping. No evidence of osteomyelitis or abscess on CT. Started on IV cefepime and vancomycin for cellulitis. -Lactic acid trending down.  - Continue management per primary team 3. History of VT:  -Admission 05/2020 for VT with recurrence during admission, felt to be driving by electrolyte imbalance. Seen by EP and started on amiodarone and mexiletine with no evidence of recurrence on last interrogation 07/19/20. Patient denies ICD shocks -Frequent PVCs on tele -Continue amiodarone and mexiletine -Continue to monitor on telemetry -Continue to monitor electrolytes closely and replete as needed to maintain K >4, Mg >2 4. History of LV thrombus:  -on chronic eliquis  without complaints of bleeding -Continue Eliquis 5. Atrial flutter s/p ablation:  -In SR today .  - Continue eliquis.  - Continue amiodarone for rhythm control 6. AKI on CKD stage 3b:  -Cr 2.7 on admission with variable baseline 1.8-2.5.  Creatinine down to 1.95  today. 7. Diverticulitis s/p ileostomy   Length of Stay: 3  FINCH, LINDSAY N, PA-C  08/07/2020, 9:19 AM  Advanced Heart Failure Team Pager [phone] (M-F; 7a - 5p)  Please contact [facility] for night-coverage after hours (5p -7a ) and weekends on [email or web address]  Patient seen with PA, agree with the above note.   Co-ox still low at 42%, CVP 15 on my read.  I = O b/c drinking a lot.  Creatinine down to 1.95.   General: NAD Neck: Thick, JVP elevated, no thyromegaly or thyroid nodule.  Lungs: Clear to auscultation bilaterally with normal respiratory effort. CV: Nonpalpable PMI.  Heart regular S1/S2, no S3/S4, no murmur.  1+ ankle  edema.  Abdomen: Soft, nontender, no hepatosplenomegaly, no distention.  Skin: Intact without lesions or rashes.  Neurologic: Alert and oriented x 3.  Psych: Normal affect. Extremities: No clubbing or cyanosis.  HEENT: Normal.   We did not increase milrinone yesterday due to frequent PVCs, but still volume overloaded and co-ox remains low.  As above, milrinone will be temporary as he is not a candidate for advanced therapies.  - He is on amiodarone and mexiletine, so I am going to increase milrinone today to 0.25.  If ectopy is significantly increased, may have to cut back.  Check co-ox after increase.  - Continue Lasix 12 mg/hr, add metolazone 2.5 mg x 1.  Replace K aggressively and check BMET in pm.   Marca Ancona 08/07/2020 10:54 AM

## 2020-08-07 NOTE — Progress Notes (Signed)
  SOB with exertion. Complaining of ongoing cough.  Continues to diuresed with lasix drip + metolazone.   Earlier today milrinone increased to 0.25 mcg. Repeat Co-OX 41.5 %  Increase PVC/NSVT noted. Stop po amio and start amio drip for PVC suppression.   CVP 6-7. Personally checked in the chair. Start 2 liters [place] oxygen.   Reluctant to increase milrinone with increased ectopy. Continue at current dose.  Back in May Palliative Care met with him to discuss [place] and he wished to continue full code status.   Discussed GOC today with recent HF exacerbations and that he is not a candidate for advanced therapies such as transplant or LVAD with morbid obesity. He tells me its a lot to think about and he wants to talk his girl friend.   I suggested formal Palliative Care consult for [place]. He did not want to pursue Palliative Care.     Izen Petz NP-C  3:07 PM

## 2020-08-07 NOTE — Addendum Note (Signed)
Encounter addended by: Jacklynn Ganong, FNP on: 08/07/2020 2:15 AM  Actions taken: Clinical Note Signed

## 2020-08-07 NOTE — Progress Notes (Signed)
PT Cancellation Note  Patient Details Name: Roberto [name] MRN: [id number] DOB: 1965/09/08   Cancelled Treatment:    Reason Eval/Treat Not Completed: Other (comment). Pt reports he didn't get any sleep and he is coughing and wheezing today.    Angelina Ok [facility] 08/07/2020, 3:56 PM Skip Mayer PT Acute Rehabilitation Services Pager [phone] Office [phone]

## 2020-08-08 DIAGNOSIS — N179 Acute kidney failure, unspecified: Secondary | ICD-10-CM | POA: Diagnosis not present

## 2020-08-08 DIAGNOSIS — L03115 Cellulitis of right lower limb: Secondary | ICD-10-CM | POA: Diagnosis not present

## 2020-08-08 DIAGNOSIS — N1832 Chronic kidney disease, stage 3b: Secondary | ICD-10-CM

## 2020-08-08 DIAGNOSIS — I5043 Acute on chronic combined systolic (congestive) and diastolic (congestive) heart failure: Secondary | ICD-10-CM | POA: Diagnosis not present

## 2020-08-08 LAB — CBC WITH DIFFERENTIAL/PLATELET
Abs Immature Granulocytes: 1.22 10*3/uL — ABNORMAL HIGH (ref 0.00–0.07)
Basophils Absolute: 0.2 10*3/uL — ABNORMAL HIGH (ref 0.0–0.1)
Basophils Relative: 1 %
Eosinophils Absolute: 0.3 10*3/uL (ref 0.0–0.5)
Eosinophils Relative: 1 %
HCT: 45.5 % (ref 39.0–52.0)
Hemoglobin: 14.8 g/dL (ref 13.0–17.0)
Immature Granulocytes: 6 %
Lymphocytes Relative: 6 %
Lymphs Abs: 1.1 10*3/uL (ref 0.7–4.0)
MCH: 30.4 pg (ref 26.0–34.0)
MCHC: 32.5 g/dL (ref 30.0–36.0)
MCV: 93.4 fL (ref 80.0–100.0)
Monocytes Absolute: 1.7 10*3/uL — ABNORMAL HIGH (ref 0.1–1.0)
Monocytes Relative: 9 %
Neutro Abs: 15 10*3/uL — ABNORMAL HIGH (ref 1.7–7.7)
Neutrophils Relative %: 77 %
Platelets: 251 10*3/uL (ref 150–400)
RBC: 4.87 MIL/uL (ref 4.22–5.81)
RDW: 15.2 % (ref 11.5–15.5)
WBC: 19.5 10*3/uL — ABNORMAL HIGH (ref 4.0–10.5)
nRBC: 0.3 % — ABNORMAL HIGH (ref 0.0–0.2)

## 2020-08-08 LAB — COMPREHENSIVE METABOLIC PANEL
ALT: 30 U/L (ref 0–44)
AST: 26 U/L (ref 15–41)
Albumin: 2 g/dL — ABNORMAL LOW (ref 3.5–5.0)
Alkaline Phosphatase: 56 U/L (ref 38–126)
Anion gap: 13 (ref 5–15)
BUN: 30 mg/dL — ABNORMAL HIGH (ref 6–20)
CO2: 23 mmol/L (ref 22–32)
Calcium: 8.7 mg/dL — ABNORMAL LOW (ref 8.9–10.3)
Chloride: 99 mmol/L (ref 98–111)
Creatinine, Ser: 2.27 mg/dL — ABNORMAL HIGH (ref 0.61–1.24)
GFR, Estimated: 33 mL/min — ABNORMAL LOW (ref >60)
Glucose, Bld: 185 mg/dL — ABNORMAL HIGH (ref 70–99)
Potassium: 3.7 mmol/L (ref 3.5–5.1)
Sodium: 135 mmol/L (ref 135–145)
Total Bilirubin: 1 mg/dL (ref 0.3–1.2)
Total Protein: 6.9 g/dL (ref 6.5–8.1)

## 2020-08-08 LAB — MAGNESIUM: Magnesium: 2.2 mg/dL (ref 1.7–2.4)

## 2020-08-08 LAB — COOXEMETRY PANEL
Carboxyhemoglobin: 1.6 % — ABNORMAL HIGH (ref 0.5–1.5)
Methemoglobin: 0.8 % (ref 0.0–1.5)
O2 Saturation: 45.9 %
Total hemoglobin: 15.1 g/dL (ref 12.0–16.0)

## 2020-08-08 MED ORDER — ONDANSETRON HCL 4 MG/2ML IJ SOLN
4.0000 mg | Freq: Four times a day (QID) | INTRAMUSCULAR | Status: DC | PRN
  Administered 2020-08-08 – 2020-08-16 (×5): 4 mg via INTRAVENOUS
  Filled 2020-08-08 (×5): qty 2

## 2020-08-08 MED ORDER — POTASSIUM CHLORIDE CRYS ER 20 MEQ PO TBCR
60.0000 meq | EXTENDED_RELEASE_TABLET | Freq: Two times a day (BID) | ORAL | Status: DC
  Administered 2020-08-08 (×2): 60 meq via ORAL
  Filled 2020-08-08 (×2): qty 3

## 2020-08-08 MED ORDER — POTASSIUM CHLORIDE CRYS ER 20 MEQ PO TBCR
40.0000 meq | EXTENDED_RELEASE_TABLET | Freq: Once | ORAL | Status: AC
  Administered 2020-08-08: 40 meq via ORAL
  Filled 2020-08-08: qty 2

## 2020-08-08 MED ORDER — METOLAZONE 5 MG PO TABS
2.5000 mg | ORAL_TABLET | Freq: Once | ORAL | Status: AC
  Administered 2020-08-08: 2.5 mg via ORAL
  Filled 2020-08-08: qty 1

## 2020-08-08 NOTE — Progress Notes (Signed)
[facility] PROGRESS NOTE    Progress Note  Roberto [name]  LEX:[id number] DOB: 06-Sep-1965 DOA: 07/27/2020 PCP: [name], MD     Brief Narrative:   Roberto [name] is an 55 y.o. male past medical history of chronic systolic heart failure with a 2D echo in December 2021 that showed an EF of 25% grade 3 diastolic dysfunction recently admitted and discharged on 07/21/2020 for systolic heart failure, he noticed some right lower extremity redness and some discharge from the wound in the ER was found to be with systolic blood pressure of 80 and concerns for right lower extremity cellulitis with imaging concerning for heart failure.  BNP of 1400 creatinine of 2.7 white blood cell count of 15    Assessment/Plan:   Acute on chronic combined systolic and diastolic CHF (congestive heart failure) (HCC) Advanced heart failure team was consulted, he was started on Lasix drip milrinone Coreg was held. Continue on Aldactone digoxin and Farxiga. He is negative about 7 L.  His milrinone was increased on 08/08/2019 CVP of 6-7 currently on 2 L of oxygen. He is currently diuresing well but is weight is unchanged. Currently on Lasix drip and milrinone, despite this he is about even and his creatinine is trending up. Will need to be aggressive about electrolytes try to keep his potassium greater than 4 magnesium greater than 2. The advanced heart failure team recommended to reconsult palliative care to discuss end-of-life.  Right lower ext cellulitis: Migrating out osteoseen on imaging.  Doppler negative for DVT. He was started on vancomycin and cefepime. He has remained afebrile but despite this his leukocytosis continues to increase.  Electrolyte imbalance: Try to keep potassium greater than 4 magnesium greater than 2.  History of VT/IVCD placement: Continue amiodarone drip monitor electrolytes closely.  History of a flutter status post ablation: Excellent continue amiodarone and  Eliquis.  History of LV thrombus, Continue Eliquis.  Acute kidney injury on chronic kidney disease stage IIIb: With a baseline creatinine around 2 this morning is 2.3 and is trending up question cardiorenal syndrome.    DVT prophylaxis: eliquis Family Communication:none Status is: Inpatient  Remains inpatient appropriate because:Hemodynamically unstable  Dispo: The patient is from: Home              Anticipated d/c is to: Home              Patient currently is not medically stable to d/c.   Difficult to place patient No   Code Status:     Code Status Orders  (From admission, onward)           Start     Ordered   08/04/20 0602  Full code  Continuous        08/04/20 0602           Code Status History     Date Active Date Inactive Code Status Order ID Comments User Context   07/19/2020 0446 07/21/2020 2000 Full Code [id number]  Hillary Bow, DO ED   06/01/2020 1148 06/10/2020 2117 Full Code [id number]  Graciella Freer, PA-C ED   04/13/2020 1429 04/19/2020 1944 Full Code [id number]  Clydie Braun, MD Inpatient   12/20/2018 2342 01/24/2019 2317 Full Code [id number]  Rometta Emery, MD Inpatient   09/03/2018 1222 09/04/2018 1736 Full Code [id number]  Regan Lemming, MD Inpatient   03/21/2017 0419 03/26/2017 2255 Full Code [id number]  Ernest Mallick, MD ED   03/21/2017 0417 03/21/2017 0419 Full Code  [id number]  Ernest Mallick, MD ED   11/17/2016 1443 11/17/2016 2257 Full Code [id number]  Regan Lemming, MD Inpatient   10/14/2016 1908 10/21/2016 1919 Full Code [id number]  Little Ishikawa, NP Inpatient         IV Access:   Peripheral IV   Procedures and diagnostic studies:   No results found.   Medical Consultants:   None.   Subjective:    Roberto [name] no new complaints but he does relate his leg is weeping  Objective:    Vitals:   08/08/20 0419 08/08/20 0635 08/08/20 0919 08/08/20 0921  BP: 95/61   (!) 107/55  Pulse: 99  95 84  Resp: 16    20  Temp: 98.1 F (36.7 C)     TempSrc: Oral     SpO2: 90%   94%  Weight:  (!) 151.6 kg    Height:       SpO2: 94 % O2 Flow Rate (L/min): 2 L/min   Intake/Output Summary (Last 24 hours) at 08/08/2020 0937 Last data filed at 08/08/2020 3419 Gross per 24 hour  Intake 2077.61 ml  Output 4900 ml  Net -2822.39 ml   Filed Weights   08/06/20 0635 08/07/20 0500 08/08/20 0635  Weight: (!) 151.2 kg (!) 151.6 kg (!) 151.6 kg    Exam: General exam: In no acute distress. Respiratory system: Good air movement and clear to auscultation. Cardiovascular system: S1 & S2 heard, RRR. No JVD.  Gastrointestinal system: Abdomen is nondistended, soft and nontender.  Extremities: No pedal edema. Skin: Right lower extremity is erythematous shiny weeping and tense tender to touch   Data Reviewed:    Labs: Basic Metabolic Panel: Recent Labs  Lab 08/05/20 0430 08/06/20 0500 08/07/20 0550 08/07/20 1638 08/08/20 0228  NA 136 137 135 131* 135  K 3.3* 3.6 3.6 3.4* 3.7  CL 104 103 102 100 99  CO2 23 25 23  21* 23  GLUCOSE 115* 124* 130* 229* 185*  BUN 30* 29* 26* 27* 30*  CREATININE 1.96* 2.07* 1.95* 2.00* 2.27*  CALCIUM 7.7* 8.3* 8.1* 8.0* 8.7*  MG 1.9 2.0 2.0  --  2.2   GFR Estimated Creatinine Clearance: 53.5 mL/min (A) (by C-G formula based on SCr of 2.27 mg/dL (H)). Liver Function Tests: Recent Labs  Lab 08/04/20 1505 08/05/20 0430 08/06/20 0500 08/07/20 0550 08/08/20 0228  AST 26 21 20 20 26   ALT 57* 43 40 34 30  ALKPHOS 56 62 51 52 56  BILITOT 1.2 1.0 1.1 1.3* 1.0  PROT 6.5 5.6* 6.4* 6.5 6.9  ALBUMIN 2.1* 1.7* 1.9* 1.9* 2.0*   No results for input(s): LIPASE, AMYLASE in the last 168 hours. No results for input(s): AMMONIA in the last 168 hours. Coagulation profile No results for input(s): INR, PROTIME in the last 168 hours. COVID-19 Labs  No results for input(s): DDIMER, FERRITIN, LDH, CRP in the last 72 hours.  Lab Results  Component Value Date   SARSCOV2NAA  NEGATIVE 08/04/2020   SARSCOV2NAA NEGATIVE 07/19/2020   SARSCOV2NAA NEGATIVE 06/01/2020   SARSCOV2NAA NEGATIVE 04/13/2020    CBC: Recent Labs  Lab 08/04/20 1505 08/05/20 0430 08/06/20 0500 08/07/20 0550 08/08/20 0228  WBC 15.0* 11.8* 12.1* 14.7* 19.5*  NEUTROABS 12.4* 9.2* 9.1* 11.2* 15.0*  HGB 14.7 11.7* 13.8 14.5 14.8  HCT 45.8 36.8* 43.7 44.7 45.5  MCV 95.2 94.8 94.0 93.7 93.4  PLT 202 184 226 226 251   Cardiac Enzymes: No results for input(s): CKTOTAL, CKMB, CKMBINDEX, TROPONINI  in the last 168 hours. BNP (last 3 results) No results for input(s): PROBNP in the last 8760 hours. CBG: No results for input(s): GLUCAP in the last 168 hours. D-Dimer: No results for input(s): DDIMER in the last 72 hours. Hgb A1c: No results for input(s): HGBA1C in the last 72 hours. Lipid Profile: No results for input(s): CHOL, HDL, LDLCALC, TRIG, CHOLHDL, LDLDIRECT in the last 72 hours. Thyroid function studies: No results for input(s): TSH, T4TOTAL, T3FREE, THYROIDAB in the last 72 hours.  Invalid input(s): FREET3 Anemia work up: No results for input(s): VITAMINB12, FOLATE, FERRITIN, TIBC, IRON, RETICCTPCT in the last 72 hours. Sepsis Labs: Recent Labs  Lab 08/04/20 0605 08/04/20 1505 08/05/20 0430 08/06/20 0500 08/07/20 0550 08/08/20 0228  WBC  --  15.0* 11.8* 12.1* 14.7* 19.5*  LATICACIDVEN 3.6* 3.5*  --  1.5  --   --    Microbiology Recent Results (from the past 240 hour(s))  Resp Panel by RT-PCR (Flu A&B, Covid) Nasopharyngeal Swab     Status: None   Collection Time: 08/04/20  2:55 AM   Specimen: Nasopharyngeal Swab; Nasopharyngeal(NP) swabs in vial transport medium  Result Value Ref Range Status   SARS Coronavirus 2 by RT PCR NEGATIVE NEGATIVE Final    Comment: (NOTE) SARS-CoV-2 target nucleic acids are NOT DETECTED.  The SARS-CoV-2 RNA is generally detectable in upper respiratory specimens during the acute phase of infection. The lowest concentration of SARS-CoV-2  viral copies this assay can detect is 138 copies/mL. A negative result does not preclude SARS-Cov-2 infection and should not be used as the sole basis for treatment or other patient management decisions. A negative result may occur with  improper specimen collection/handling, submission of specimen other than nasopharyngeal swab, presence of viral mutation(s) within the areas targeted by this assay, and inadequate number of viral copies(<138 copies/mL). A negative result must be combined with clinical observations, patient history, and epidemiological information. The expected result is Negative.  Fact Sheet for Patients:  BloggerCourse.com  Fact Sheet for Healthcare Providers:  SeriousBroker.it  This test is no t yet approved or cleared by the [place] FDA and  has been authorized for detection and/or diagnosis of SARS-CoV-2 by FDA under an Emergency Use Authorization (EUA). This EUA will remain  in effect (meaning this test can be used) for the duration of the COVID-19 declaration under Section 564(b)(1) of the Act, 21 U.S.C.section 360bbb-3(b)(1), unless the authorization is terminated  or revoked sooner.       Influenza A by PCR NEGATIVE NEGATIVE Final   Influenza B by PCR NEGATIVE NEGATIVE Final    Comment: (NOTE) The Xpert Xpress SARS-CoV-2/FLU/RSV plus assay is intended as an aid in the diagnosis of influenza from Nasopharyngeal swab specimens and should not be used as a sole basis for treatment. Nasal washings and aspirates are unacceptable for Xpert Xpress SARS-CoV-2/FLU/RSV testing.  Fact Sheet for Patients: BloggerCourse.com  Fact Sheet for Healthcare Providers: SeriousBroker.it  This test is not yet approved or cleared by the [place] FDA and has been authorized for detection and/or diagnosis of SARS-CoV-2 by FDA under an Emergency Use Authorization  (EUA). This EUA will remain in effect (meaning this test can be used) for the duration of the COVID-19 declaration under Section 564(b)(1) of the Act, 21 U.S.C. section 360bbb-3(b)(1), unless the authorization is terminated or revoked.  Performed at [facility], [address], [place], [place] [address]   Culture, blood (routine x 2)     Status: None (Preliminary  result)   Collection Time: 08/04/20  6:05 AM   Specimen: BLOOD LEFT HAND  Result Value Ref Range Status   Specimen Description BLOOD LEFT HAND  Final   Special Requests   Final    BOTTLES DRAWN AEROBIC AND ANAEROBIC Blood Culture results may not be optimal due to an inadequate volume of blood received in culture bottles   Culture   Final    NO GROWTH 3 DAYS Performed at [facility], [address] [place], [place] [address]    Report Status PENDING  Incomplete  Culture, blood (routine x 2)     Status: None (Preliminary result)   Collection Time: 08/04/20  6:10 AM   Specimen: BLOOD RIGHT HAND  Result Value Ref Range Status   Specimen Description BLOOD RIGHT HAND  Final   Special Requests   Final    BOTTLES DRAWN AEROBIC AND ANAEROBIC Blood Culture results may not be optimal due to an inadequate volume of blood received in culture bottles   Culture   Final    NO GROWTH 3 DAYS Performed at [facility], [address], [place], [place] [address]    Report Status PENDING  Incomplete     Medications:    apixaban  5 mg Oral BID   Chlorhexidine Gluconate Cloth  6 each Topical Daily   dapagliflozin propanediol  10 mg Oral Daily   digoxin  0.0625 mg Oral Daily   guaiFENesin  600 mg Oral BID   mexiletine  300 mg Oral Q12H   pantoprazole  40 mg Oral Daily   ranolazine  500 mg Oral BID   sodium chloride flush  10-40 mL Intracatheter Q12H   Continuous Infusions:  amiodarone 30 mg/hr (08/08/20 0836)   ceFEPime (MAXIPIME) IV 200 mL/hr at 08/08/20 0600   furosemide (LASIX) 200 mg in dextrose 5% 100 mL  (2mg /mL) infusion 12 mg/hr (08/08/20 0600)   milrinone 0.25 mcg/kg/min (08/08/20 0837)   vancomycin Stopped (08/07/20 1439)      LOS: 4 days   08/09/20  [facility]  08/08/2020, 9:37 AM

## 2020-08-08 NOTE — Progress Notes (Signed)
Pharmacy Antibiotic Note  Roberto [name] is a 55 y.o. male admitted on 08/07/2020 with cellulitis.  Pharmacy has been consulted for vancomycin and cefepime dosing.  Assessment: Patient currently afebrile, wbc 19.5. Scr trending up to 2.2. Cxs no growth to date  Plan: Vancomycin 1500mg  daily - consider vancomycin trough in am if continued Cefepime 2g IV Q8H.  Height: 5\' 8"  (172.7 cm) Weight: (!) 151.6 kg (334 lb 3.5 oz) IBW/kg (Calculated) : 68.4  Temp (24hrs), Avg:98.2 F (36.8 C), Min:98 F (36.7 C), Max:98.6 F (37 C)  Recent Labs  Lab 08/04/20 0605 08/04/20 1505 08/05/20 0430 08/06/20 0500 08/07/20 0550 08/07/20 1638 08/08/20 0228  WBC  --  15.0* 11.8* 12.1* 14.7*  --  19.5*  CREATININE  --  2.40* 1.96* 2.07* 1.95* 2.00* 2.27*  LATICACIDVEN 3.6* 3.5*  --  1.5  --   --   --      Estimated Creatinine Clearance: 53.5 mL/min (A) (by C-G formula based on SCr of 2.27 mg/dL (H)).    Allergies  Allergen Reactions   Shrimp [Shellfish Allergy] Hives and Itching   Thank you for allowing pharmacy to participate in this patient's care.  08/09/20 PharmD., BCPS Clinical Pharmacist 08/08/2020 2:45 PM

## 2020-08-08 NOTE — Progress Notes (Signed)
Physical Therapy Treatment Patient Details Name: Roberto [name] MRN: [id number] DOB: 10/03/1965 Today's Date: 08/08/2020    History of Present Illness 55 y/o male admitted 7/22 with acute on chronic combined systolic and diastolic CHF and RLE cellulitis. PMH: d/c 07/21/20 for acute on chronic systolic CHF with grade 3 diastolic dysfunction, diverticulitis, CAD, DVT, venous insufficiency, atypical atrial flutter, gout, CKD stage III, nonischemic cardiomyopathy, and hepatitis.    PT Comments    Pt received in supine, agreeable to limited supine/seated exercises but deferred OOB/gait trial due to fatigue after sitting up in chair all day and RLE discomfort/drainage. Reviewed supine/seated HEP program and pt performed as detailed below with good tolerance, handout given to reinforce. Pt modI for transfers this date, will plan to perform DGI/further gait progression next session as tolerated. Pt continues to benefit from PT services to progress toward functional mobility goals.   Follow Up Recommendations  Home health PT     Equipment Recommendations  None recommended by PT    Recommendations for Other Services       Precautions / Restrictions Precautions Precautions: Fall Precaution Comments: RLE weeping/drainage Restrictions Weight Bearing Restrictions: No    Mobility  Bed Mobility Overal bed mobility: Modified Independent      General bed mobility comments: increased time/effort    Transfers Overall transfer level: Modified independent   Transfers: Sit to/from Stand;Lateral/Scoot Transfers Sit to Stand: Modified independent (Device/Increase time)        Lateral/Scoot Transfers: Modified independent (Device/Increase time) General transfer comment: No use of AD for sit to stand, increased time to rise, pt also scooted along EOB with Supervision, pt deferred OOB mobility to chair as he just got back to bed recently from sitting up in chair.  Ambulation/Gait    General Gait  Details: pt defer due to multiple PVCs and increased RLE drainage, reviewed importance of repositioning, LE exercises and positioning for edema reduction      Balance Overall balance assessment: No apparent balance deficits (not formally assessed)         Cognition Arousal/Alertness: Awake/alert Behavior During Therapy: WFL for tasks assessed/performed Overall Cognitive Status: Within Functional Limits for tasks assessed        General Comments: Responded to commands accurately      Exercises General Exercises - Lower Extremity Ankle Circles/Pumps: AROM;Right;10 reps;Supine Quad Sets: AROM;Both;10 reps;Supine Gluteal Sets: AROM;5 reps;Supine Long Arc Quad: AROM;10 reps;Both;Seated Heel Slides: AROM;Right;5 reps;Supine Hip ABduction/ADduction: AROM;Right;10 reps;Supine Hip Flexion/Marching: AROM;Both;10 reps;Seated (raising each UE alternating opposite arm with opposite leg per HEP handout) Other Exercises Other Exercises: seated BUE AROM: scapular retraction, shoulder rolls, shoulder flexion x10 reps ea Other Exercises: verbal review for chair push-ups and serial STS but pt deferring to attempt these due to fatigue, and reports no low back pain so deferring posterior pelvic tilts Other Exercises: supine RLE AROM: hip IR x10 reps    General Comments General comments (skin integrity, edema, etc.): BP 91/60 prior to transfers and no dizziness reported with sitting EOB/standing at bedside      Pertinent Vitals/Pain Pain Assessment: Faces Faces Pain Scale: Hurts a little bit Pain Location: RLE (calf) Pain Descriptors / Indicators: Discomfort;Tingling;Tightness Pain Intervention(s): Limited activity within patient's tolerance;Monitored during session;Repositioned (encouraged him to elevated RLE on/off as tolerated throughout day)     PT Goals (current goals can now be found in the care plan section) Acute Rehab PT Goals Patient Stated Goal: To return home and work out  again PT Goal Formulation: With patient Time  For Goal Achievement: 2020/08/30 Progress towards PT goals: Progressing toward goals    Frequency    Min 3X/week      PT Plan Current plan remains appropriate       AM-PAC PT "6 Clicks" Mobility   Outcome Measure  Help needed turning from your back to your side while in a flat bed without using bedrails?: None Help needed moving from lying on your back to sitting on the side of a flat bed without using bedrails?: None Help needed moving to and from a bed to a chair (including a wheelchair)?: None Help needed standing up from a chair using your arms (e.g., wheelchair or bedside chair)?: None Help needed to walk in hospital room?: None Help needed climbing 3-5 steps with a railing? : A Little 6 Click Score: 23    End of Session   Activity Tolerance: Patient limited by fatigue;Patient limited by pain (RLE pain) Patient left: with call bell/phone within reach;in bed;with family/visitor present (RLE elevated, heel floated) Nurse Communication: Mobility status PT Visit Diagnosis: Other abnormalities of gait and mobility (R26.89)     Time: 8032-1224 PT Time Calculation (min) (ACUTE ONLY): 19 min  Charges:  $Therapeutic Exercise: 8-22 mins                     Roberto [name] P., PTA Acute Rehabilitation Services Pager: [phone] Office: [phone]    Angus Palms 08/08/2020, 5:38 PM

## 2020-08-08 NOTE — Progress Notes (Addendum)
Patient ID: Roberto [name], male   DOB: [birth year], 55 y.o.   MRN: [id number]     Advanced Heart Failure Rounding Note  PCP-Cardiologist: Glori Bickers, MD   Subjective:   Co-Ox 41.5% > 45% on 0.25 milrinone Amio gtt started 07/26 d/t increased PVCs/NSVT   CVP 10-11. Diuresing with lasix gtt and metolazone. Weight not down, but - 3L last 24 hrs.   Creatinine tending up 1.95  > 2.00 > 2.27  Continues with cough. Robitussin helps. Dyspnea improved. No CP.  He remains on vancomycin/cefepime for RLE cellulitis.  Blood cultures NGTD.     Objective:   Weight Range: (!) 151.6 kg Body mass index is 50.82 kg/m.   Vital Signs:   Temp:  [98.1 F (36.7 C)-98.6 F (37 C)] 98.3 F (36.8 C) (07/27 0921) Pulse Rate:  [84-99] 84 (07/27 0921) Resp:  [16-20] 20 (07/27 0921) BP: (95-113)/(55-78) 107/55 (07/27 0921) SpO2:  [90 %-100 %] 94 % (07/27 0921) Weight:  [151.6 kg] 151.6 kg (07/27 0635) Last BM Date: 08/07/20  Weight change: Filed Weights   08/06/20 0635 08/07/20 0500 08/08/20 0635  Weight: (!) 151.2 kg (!) 151.6 kg (!) 151.6 kg    Intake/Output:   Intake/Output Summary (Last 24 hours) at 08/08/2020 1026 Last data filed at 08/08/2020 9379 Gross per 24 hour  Intake 2077.61 ml  Output 4900 ml  Net -2822.39 ml      Physical Exam  CVP 10-11 General:  Sitting up in chair. No resp difficulty. Comfortable on RA. HEENT: normal Neck: supple. +JVD. Carotids 2+ bilat; no bruits. No lymphadenopathy or thryomegaly appreciated. Cor: PMI nondisplaced. Regular rhythm with frequent ectopy. No rubs, gallops or murmurs. Lungs: crackles in the bases Abdomen: soft, nontender, nondistended. No hepatosplenomegaly. No bruits or masses. Good bowel sounds. Extremities: no cyanosis, clubbing, rash, RLE erythema. Bilateral lower extremity edema R>L.  Neuro: alert & orientedx3, cranial nerves grossly intact. moves all 4 extremities w/o difficulty. Affect pleasant  Telemetry   SR with very  frequent PVCs and runs of NSVT Labs    CBC Recent Labs    08/07/20 0550 08/08/20 0228  WBC 14.7* 19.5*  NEUTROABS 11.2* 15.0*  HGB 14.5 14.8  HCT 44.7 45.5  MCV 93.7 93.4  PLT 226 024   Basic Metabolic Panel Recent Labs    08/07/20 0550 08/07/20 1638 08/08/20 0228  NA 135 131* 135  K 3.6 3.4* 3.7  CL 102 100 99  CO2 23 21* 23  GLUCOSE 130* 229* 185*  BUN 26* 27* 30*  CREATININE 1.95* 2.00* 2.27*  CALCIUM 8.1* 8.0* 8.7*  MG 2.0  --  2.2   Liver Function Tests Recent Labs    08/07/20 0550 08/08/20 0228  AST 20 26  ALT 34 30  ALKPHOS 52 56  BILITOT 1.3* 1.0  PROT 6.5 6.9  ALBUMIN 1.9* 2.0*   No results for input(s): LIPASE, AMYLASE in the last 72 hours. Cardiac Enzymes No results for input(s): CKTOTAL, CKMB, CKMBINDEX, TROPONINI in the last 72 hours.  BNP: BNP (last 3 results) Recent Labs    07/19/20 0222 07/23/20 1117 07/25/2020 1707  BNP 959.9* 992.0* 1,426.8*    ProBNP (last 3 results) No results for input(s): PROBNP in the last 8760 hours.   D-Dimer No results for input(s): DDIMER in the last 72 hours. Hemoglobin A1C No results for input(s): HGBA1C in the last 72 hours. Fasting Lipid Panel No results for input(s): CHOL, HDL, LDLCALC, TRIG, CHOLHDL, LDLDIRECT in the last 72 hours. Thyroid  Function Tests No results for input(s): TSH, T4TOTAL, T3FREE, THYROIDAB in the last 72 hours.  Invalid input(s): FREET3  Other results:   Imaging    No results found.   Medications:     Scheduled Medications:  apixaban  5 mg Oral BID   Chlorhexidine Gluconate Cloth  6 each Topical Daily   dapagliflozin propanediol  10 mg Oral Daily   digoxin  0.0625 mg Oral Daily   guaiFENesin  600 mg Oral BID   mexiletine  300 mg Oral Q12H   pantoprazole  40 mg Oral Daily   ranolazine  500 mg Oral BID   sodium chloride flush  10-40 mL Intracatheter Q12H    Infusions:  amiodarone 30 mg/hr (08/08/20 0836)   ceFEPime (MAXIPIME) IV 200 mL/hr at 08/08/20  0600   furosemide (LASIX) 200 mg in dextrose 5% 100 mL (50m/mL) infusion 12 mg/hr (08/08/20 0600)   milrinone 0.25 mcg/kg/min (08/08/20 0837)   vancomycin Stopped (08/07/20 1439)    PRN Medications: acetaminophen **OR** acetaminophen, benzonatate, guaiFENesin-dextromethorphan, ondansetron (ZOFRAN) IV, sodium chloride flush   Assessment/Plan   1. 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  Echo 12/21 with EF 25-30%, moderate LV dilation. Has medtronic ICD.  Patient sent from office visit 07/25/2020 with cellulitis, as well as acute on chronic combined CHF. Lactate elevated to 3.6. Suspect cellulitis with sepsis in setting of acute/chronic systolic CHF. Nearing end-stage.  -Co-ox 45% despite increasing milrinone to 0.25 yesterday. Hesitant to increase milrinone any further d/t very frequent ectopy. -CVP 10-11. Appears overloaded. Continue furosemide gtt at 12/hr. Neg 3L yesterday. Doing better with restricting fluids. -Continue to monitor electrolytes closely and replete as needed to maintain K >4, Mg >2 -Can hold carvedilol and spironolactone in the setting of decompensated CHF and AKI -Continue digoxin and farxiga -Had discussion yesterday about GOC given recurrent HF exacerbations. Not a candidate for advanced therapies given size and ileostomy. He wishes to remain full code at this point. Has met with palliative care in the past, declined consult. 2. RLE cellulitis:  -Severe sepsis. Patient injured his leg PTA. No evidence of osteomyelitis or abscess on CT. On IV cefepime and vancomycin for cellulitis. -Lactic acid trending down.  -Continues with leukocytosis -Continue management per primary team 3. History of VT:  -Admission 05/2020 for VT with recurrence during admission, felt to be driving by electrolyte imbalance. Seen by EP and started on amiodarone and mexiletine with no evidence of recurrence on last interrogation 07/19/20. Patient denies ICD shocks -Very frequent PVCs and runs of NSVT on  tele. Continue to monitor. -Continue amio gtt -Continue mexiletine -Continue to monitor electrolytes closely and replete as needed to maintain K >4, Mg >2 4. History of LV thrombus:  -on chronic eliquis without complaints of bleeding -Continue Eliquis 5. Atrial flutter s/p ablation:  -In SR today .  -Continue eliquis.  -Continue amiodarone for rhythm control 6. AKI on CKD stage 3b:  -Variable baseline creatinine 1.8-2.5.   -2.7 on admit, down to 1.95. Now 2.00 > 2.27 7. Diverticulitis s/p ileostomy   Length of Stay: 4  FINCH, LINDSAY N, PA-C  08/08/2020, 10:26 AM  Advanced Heart Failure Team Pager [phone](M-F; 7a - 5p)  Please contact [facility] for night-coverage after hours (5p -7a ) and weekends on [email or web address]  Patient seen with PA, agree with the above note.   Co-ox still low at 45% but with lots of PVCs, CVP 12 on my read.  I/Os negative but creatinine up 2 => 2.27.  General: NAD Neck: Thick. JVP 10-12 cm, no thyromegaly or thyroid nodule.  Lungs: Clear to auscultation bilaterally with normal respiratory effort. CV: Nondisplaced PMI.  Heart regular S1/S2, no S3/S4, no murmur.  1+ edema to knees.   Abdomen: Soft, nontender, no hepatosplenomegaly, no distention.  Skin: Erythema RLE.   Neurologic: Alert and oriented x 3.  Psych: Normal affect. Extremities: No clubbing or cyanosis.  HEENT: Normal.   We did not increase milrinone today due to frequent PVCs.  Co-ox low at 45% but diuresing better.  CVP 12 (lower).  As above, milrinone will be temporary as he is not a candidate for advanced therapies. - Continue milrinone 0.25, loathe to increase with frequent ectopy.  - Continue Lasix gtt at 12 mg/hr and will give metolazone 2.5 mg x 1.  Replace K.  - Continue amiodarone gtt for frequent PVCs/NSVT.  - Not interested at this point in talking with palliative care.   Loralie Champagne 08/08/2020 4:49 PM

## 2020-08-09 DIAGNOSIS — L03115 Cellulitis of right lower limb: Secondary | ICD-10-CM | POA: Diagnosis not present

## 2020-08-09 DIAGNOSIS — N1832 Chronic kidney disease, stage 3b: Secondary | ICD-10-CM | POA: Diagnosis not present

## 2020-08-09 DIAGNOSIS — I5043 Acute on chronic combined systolic (congestive) and diastolic (congestive) heart failure: Secondary | ICD-10-CM | POA: Diagnosis not present

## 2020-08-09 DIAGNOSIS — N179 Acute kidney failure, unspecified: Secondary | ICD-10-CM | POA: Diagnosis not present

## 2020-08-09 LAB — VANCOMYCIN, TROUGH: Vancomycin Tr: 36 ug/mL (ref 15–20)

## 2020-08-09 LAB — BASIC METABOLIC PANEL
Anion gap: 10 (ref 5–15)
Anion gap: 16 — ABNORMAL HIGH (ref 5–15)
BUN: 35 mg/dL — ABNORMAL HIGH (ref 6–20)
BUN: 37 mg/dL — ABNORMAL HIGH (ref 6–20)
CO2: 21 mmol/L — ABNORMAL LOW (ref 22–32)
CO2: 20 mmol/L — ABNORMAL LOW (ref 22–32)
Calcium: 8.3 mg/dL — ABNORMAL LOW (ref 8.9–10.3)
Calcium: 8.5 mg/dL — ABNORMAL LOW (ref 8.9–10.3)
Chloride: 101 mmol/L (ref 98–111)
Chloride: 96 mmol/L — ABNORMAL LOW (ref 98–111)
Creatinine, Ser: 2.6 mg/dL — ABNORMAL HIGH (ref 0.61–1.24)
Creatinine, Ser: 2.78 mg/dL — ABNORMAL HIGH (ref 0.61–1.24)
GFR, Estimated: 28 mL/min — ABNORMAL LOW (ref >60)
GFR, Estimated: 26 mL/min — ABNORMAL LOW (ref >60)
Glucose, Bld: 167 mg/dL — ABNORMAL HIGH (ref 70–99)
Glucose, Bld: 261 mg/dL — ABNORMAL HIGH (ref 70–99)
Potassium: 3.7 mmol/L (ref 3.5–5.1)
Potassium: 3.8 mmol/L (ref 3.5–5.1)
Sodium: 132 mmol/L — ABNORMAL LOW (ref 135–145)
Sodium: 132 mmol/L — ABNORMAL LOW (ref 135–145)

## 2020-08-09 LAB — CBC WITH DIFFERENTIAL/PLATELET
Abs Immature Granulocytes: 1.14 10*3/uL — ABNORMAL HIGH (ref 0.00–0.07)
Basophils Absolute: 0.2 10*3/uL — ABNORMAL HIGH (ref 0.0–0.1)
Basophils Relative: 1 %
Eosinophils Absolute: 0.3 10*3/uL (ref 0.0–0.5)
Eosinophils Relative: 2 %
HCT: 44.7 % (ref 39.0–52.0)
Hemoglobin: 14.7 g/dL (ref 13.0–17.0)
Immature Granulocytes: 6 %
Lymphocytes Relative: 5 %
Lymphs Abs: 1 10*3/uL (ref 0.7–4.0)
MCH: 30.6 pg (ref 26.0–34.0)
MCHC: 32.9 g/dL (ref 30.0–36.0)
MCV: 92.9 fL (ref 80.0–100.0)
Monocytes Absolute: 1.6 10*3/uL — ABNORMAL HIGH (ref 0.1–1.0)
Monocytes Relative: 9 %
Neutro Abs: 14 10*3/uL — ABNORMAL HIGH (ref 1.7–7.7)
Neutrophils Relative %: 77 %
Platelets: 255 10*3/uL (ref 150–400)
RBC: 4.81 MIL/uL (ref 4.22–5.81)
RDW: 15.4 % (ref 11.5–15.5)
WBC: 18.3 10*3/uL — ABNORMAL HIGH (ref 4.0–10.5)
nRBC: 0 % (ref 0.0–0.2)

## 2020-08-09 LAB — COOXEMETRY PANEL
Carboxyhemoglobin: 1.2 % (ref 0.5–1.5)
Methemoglobin: 0.7 % (ref 0.0–1.5)
O2 Saturation: 53.1 %
Total hemoglobin: 15.1 g/dL (ref 12.0–16.0)

## 2020-08-09 LAB — DIGOXIN LEVEL: Digoxin Level: 0.9 ng/mL (ref 0.8–2.0)

## 2020-08-09 LAB — MAGNESIUM: Magnesium: 1.9 mg/dL (ref 1.7–2.4)

## 2020-08-09 LAB — CULTURE, BLOOD (ROUTINE X 2)
Culture: NO GROWTH
Culture: NO GROWTH

## 2020-08-09 MED ORDER — MAGNESIUM SULFATE 50 % IJ SOLN: 2.0000 g | Freq: Once | INTRAMUSCULAR | Status: DC

## 2020-08-09 MED ORDER — MAGNESIUM OXIDE -MG SUPPLEMENT 400 (240 MG) MG PO TABS
400.0000 mg | ORAL_TABLET | Freq: Every day | ORAL | Status: DC
  Administered 2020-08-09: 400 mg via ORAL
  Filled 2020-08-09: qty 1

## 2020-08-09 MED ORDER — POTASSIUM CHLORIDE CRYS ER 20 MEQ PO TBCR
80.0000 meq | EXTENDED_RELEASE_TABLET | Freq: Three times a day (TID) | ORAL | Status: AC
  Administered 2020-08-09 (×3): 80 meq via ORAL
  Filled 2020-08-09 (×3): qty 4

## 2020-08-09 MED ORDER — POTASSIUM CHLORIDE CRYS ER 20 MEQ PO TBCR: 80.0000 meq | EXTENDED_RELEASE_TABLET | Freq: Two times a day (BID) | ORAL | Status: DC

## 2020-08-09 MED ORDER — VANCOMYCIN VARIABLE DOSE PER UNSTABLE RENAL FUNCTION (PHARMACIST DOSING): Status: DC

## 2020-08-09 MED ORDER — MAGNESIUM SULFATE 2 GM/50ML IV SOLN
2.0000 g | Freq: Once | INTRAVENOUS | Status: AC
  Administered 2020-08-09: 2 g via INTRAVENOUS
  Filled 2020-08-09: qty 50

## 2020-08-09 MED ORDER — SODIUM CHLORIDE 0.9 % IV SOLN
2.0000 g | Freq: Two times a day (BID) | INTRAVENOUS | Status: DC
  Administered 2020-08-09 – 2020-08-10 (×2): 2 g via INTRAVENOUS
  Filled 2020-08-09 (×2): qty 2

## 2020-08-09 NOTE — Progress Notes (Signed)
Physical Therapy Treatment Patient Details Name: Roberto [name] MRN: [id number] DOB: 11-18-1965 Today's Date: 08/09/2020    History of Present Illness 55 y/o male admitted 7/22 with acute on chronic combined systolic and diastolic CHF and RLE cellulitis. The advanced heart failure team recommended to reconsult palliative care to discuss end-of-life. PMH: d/c 07/21/20 for acute on chronic systolic CHF with grade 3 diastolic dysfunction, diverticulitis, CAD, DVT, venous insufficiency, atypical atrial flutter, gout, CKD stage IIIb, nonischemic cardiomyopathy, and hepatitis.    PT Comments    Pt received in chair, agreeable to therapy session and with good participation and fair tolerance for gait trial. Pt quick to fatigue and reports 8.5/10 modified RPE after less than household distance gait trial and noted increased respiratory rate to 41 rpm with exertion, VSS otherwise. Pt modI to Supervision for mobility tasks mostly cueing for activity pacing/safety. Also instructed him on pressure relief strategies/frequency as he has been up in chair prolonged period of time this date. Pt continues to benefit from PT services to progress toward functional mobility goals.    Follow Up Recommendations  Home health PT     Equipment Recommendations  None recommended by PT    Recommendations for Other Services       Precautions / Restrictions Precautions Precautions: Fall Precaution Comments: RLE weeping/drainage Restrictions Weight Bearing Restrictions: No    Mobility  Bed Mobility Overal bed mobility: Modified Independent             General bed mobility comments: increased time/effort    Transfers Overall transfer level: Modified independent   Transfers: Sit to/from Stand Sit to Stand: Modified independent (Device/Increase time)         General transfer comment: No use of AD for sit to stand, increased time to rise.  Ambulation/Gait Ambulation/Gait assistance: Supervision Gait  Distance (Feet): 60 Feet Assistive device: None Gait Pattern/deviations: Wide base of support;Step-through pattern   Gait velocity interpretation: <1.31 ft/sec, indicative of household ambulator General Gait Details: cues for activity pacing, no LOB, pt holding handrail during standing break after ~20ft; HR 100 bpm SpO2 93% on RA      Balance Overall balance assessment: No apparent balance deficits (not formally assessed)        Cognition Arousal/Alertness: Awake/alert Behavior During Therapy: WFL for tasks assessed/performed Overall Cognitive Status: Within Functional Limits for tasks assessed            General Comments: Responded to commands accurately, pleasantly cooperative.      Exercises General Exercises - Lower Extremity Ankle Circles/Pumps: AROM;10 reps;Supine;Both Other Exercises Other Exercises: supine RLE AROM: hip IR x10 reps    General Comments General comments (skin integrity, edema, etc.): BP 107/67 post-exertion seated in chair; VSS on RA; Pt reports 8.5/10 modified RPE (fatigue) after gait trial      Pertinent Vitals/Pain Pain Assessment: No/denies pain Pain Intervention(s): Monitored during session;Repositioned    Home Living                      Prior Function            PT Goals (current goals can now be found in the care plan section) Acute Rehab PT Goals Patient Stated Goal: To return home and work out again PT Goal Formulation: With patient Time For Goal Achievement: 08/23/2020 Progress towards PT goals: Progressing toward goals    Frequency    Min 3X/week      PT Plan Current plan remains appropriate  Co-evaluation              AM-PAC PT "6 Clicks" Mobility   Outcome Measure  Help needed turning from your back to your side while in a flat bed without using bedrails?: None Help needed moving from lying on your back to sitting on the side of a flat bed without using bedrails?: None Help needed moving to and  from a bed to a chair (including a wheelchair)?: None Help needed standing up from a chair using your arms (e.g., wheelchair or bedside chair)?: None Help needed to walk in hospital room?: A Little Help needed climbing 3-5 steps with a railing? : A Little 6 Click Score: 22    End of Session   Activity Tolerance: Patient limited by fatigue Patient left: with call bell/phone within reach;with family/visitor present;in chair (daughter in room napping in bed) Nurse Communication: Mobility status;Other (comment) (pt fatigue rating) PT Visit Diagnosis: Other abnormalities of gait and mobility (R26.89)     Time: 1761-6073 PT Time Calculation (min) (ACUTE ONLY): 14 min  Charges:  $Gait Training: 8-22 mins                     Roberto [name] P., PTA Acute Rehabilitation Services Pager: [phone] Office: [phone]    Roberto [name] 08/09/2020, 7:07 PM

## 2020-08-09 NOTE — Progress Notes (Signed)
[facility] PROGRESS NOTE    Progress Note  SHERYL JUNG  UDJ:[id number] DOB: 07/14/1965 DOA: 08/10/2020 PCP: [name], MD     Brief Narrative:   Roberto [name] is an 55 y.o. male past medical history of chronic systolic heart failure with a 2D echo in December 2021 that showed an EF of 25% grade 3 diastolic dysfunction recently admitted and discharged on 07/21/2020 for systolic heart failure, he noticed some right lower extremity redness and some discharge from the wound in the ER was found to be with systolic blood pressure of 80 and concerns for right lower extremity cellulitis with imaging concerning for heart failure.  BNP of 1400 creatinine of 2.7 white blood cell count of 15    Assessment/Plan:   Acute on chronic combined systolic and diastolic CHF (congestive heart failure) (HCC) Advanced heart failure team was consulted. Continue on Aldactone digoxin and Farxiga. CVP of 7 weight down 12 pounds no not accurate. Creatinine at 2.6. Lasix drip stopped stopped transition to oral torsemide further management per advanced heart failure team. Continue milrinone at current dose and frequent PVCs. CVP around 7.  Now satting greater 90% on room air. The advanced heart failure team recommended to reconsult palliative care to discuss end-of-life. Extremely poor prognosis refuses to meet with palliative care team. Patient has unrealistic expectations  Right lower ext cellulitis: No osteomyelitis seen on imaging Doppler negative for DVT. Leg continues to be erythematous warm to touch, persistent leukocytosis. Continue IV vancomycin and cefepime  Electrolyte imbalance: Try to keep potassium greater than 4 magnesium greater than 2.  History of VT/IVCD placement: Continue amiodarone drip monitor electrolytes closely.  History of a flutter status post ablation:  continue amiodarone and Eliquis.  History of LV thrombus, Continue Eliquis.  Acute kidney injury on chronic  kidney disease stage IIIb: With a baseline creatinine around 2, creatinine continues to rise despite medical therapy.  Further management per advanced heart failure team    DVT prophylaxis: eliquis Family Communication:none Status is: Inpatient  Remains inpatient appropriate because:Hemodynamically unstable  Dispo: The patient is from: Home              Anticipated d/c is to: Home              Patient currently is not medically stable to d/c.   Difficult to place patient No   Code Status:     Code Status Orders  (From admission, onward)           Start     Ordered   08/04/20 0602  Full code  Continuous        08/04/20 0602           Code Status History     Date Active Date Inactive Code Status Order ID Comments User Context   07/19/2020 0446 07/21/2020 2000 Full Code [id number]  Hillary Bow, DO ED   06/01/2020 1148 06/10/2020 2117 Full Code [id number]  Graciella Freer, PA-C ED   04/13/2020 1429 04/19/2020 1944 Full Code [id number]  Clydie Braun, MD Inpatient   12/20/2018 2342 01/24/2019 2317 Full Code [id number]  Rometta Emery, MD Inpatient   09/03/2018 1222 09/04/2018 1736 Full Code [id number]  Regan Lemming, MD Inpatient   03/21/2017 0419 03/26/2017 2255 Full Code [id number]  Ernest Mallick, MD ED   03/21/2017 0417 03/21/2017 0419 Full Code [id number]  Ernest Mallick, MD ED   11/17/2016 1443 11/17/2016 2257 Full Code [id number]  Camnitz, Will Daphine Deutscher,  MD Inpatient   10/14/2016 1908 10/21/2016 1919 Full Code [id number]  Little Ishikawa, NP Inpatient         IV Access:   Peripheral IV   Procedures and diagnostic studies:   No results found.   Medical Consultants:   None.   Subjective:    FLOR HOUDESHELL leg no further weeping  Objective:    Vitals:   08/09/20 0019 08/09/20 0440 08/09/20 0441 08/09/20 0948  BP:  (!) 94/56 (!) 94/56 112/70  Pulse: 90 83 83   Resp:  (!) 24 20 18   Temp:  (!) 97.5 F (36.4 C)  (!) 97.5 F (36.4 C)  TempSrc:   Oral  Oral  SpO2: 93% 93% 93% 93%  Weight:  (!) 146.2 kg    Height:       SpO2: 93 % O2 Flow Rate (L/min): 2 L/min   Intake/Output Summary (Last 24 hours) at 08/09/2020 1025 Last data filed at 08/09/2020 0944 Gross per 24 hour  Intake 2605.15 ml  Output 3595 ml  Net -989.85 ml    Filed Weights   08/07/20 0500 08/08/20 0635 08/09/20 0440  Weight: (!) 151.6 kg (!) 151.6 kg (!) 146.2 kg    Exam: General exam: In no acute distress. Respiratory system: Good air movement and clear to auscultation. Cardiovascular system: S1 & S2 heard, RRR. No JVD. Gastrointestinal system: Abdomen is nondistended, soft and nontender.  Psychiatry: Judgement and insight appear normal. Mood & affect appropriate. Skin: Right lower extremity is erythematous shiny, not weeping and tense tender to touch   Data Reviewed:    Labs: Basic Metabolic Panel: Recent Labs  Lab 08/05/20 0430 08/06/20 0500 08/07/20 0550 08/07/20 1638 08/08/20 0228 08/09/20 0451  NA 136 137 135 131* 135 132*  K 3.3* 3.6 3.6 3.4* 3.7 3.7  CL 104 103 102 100 99 101  CO2 23 25 23  21* 23 21*  GLUCOSE 115* 124* 130* 229* 185* 167*  BUN 30* 29* 26* 27* 30* 35*  CREATININE 1.96* 2.07* 1.95* 2.00* 2.27* 2.60*  CALCIUM 7.7* 8.3* 8.1* 8.0* 8.7* 8.3*  MG 1.9 2.0 2.0  --  2.2 1.9    GFR Estimated Creatinine Clearance: 45.7 mL/min (A) (by C-G formula based on SCr of 2.6 mg/dL (H)). Liver Function Tests: Recent Labs  Lab 08/04/20 1505 08/05/20 0430 08/06/20 0500 08/07/20 0550 08/08/20 0228  AST 26 21 20 20 26   ALT 57* 43 40 34 30  ALKPHOS 56 62 51 52 56  BILITOT 1.2 1.0 1.1 1.3* 1.0  PROT 6.5 5.6* 6.4* 6.5 6.9  ALBUMIN 2.1* 1.7* 1.9* 1.9* 2.0*    No results for input(s): LIPASE, AMYLASE in the last 168 hours. No results for input(s): AMMONIA in the last 168 hours. Coagulation profile No results for input(s): INR, PROTIME in the last 168 hours. COVID-19 Labs  No results for input(s): DDIMER, FERRITIN, LDH, CRP in  the last 72 hours.  Lab Results  Component Value Date   SARSCOV2NAA NEGATIVE 08/04/2020   SARSCOV2NAA NEGATIVE 07/19/2020   SARSCOV2NAA NEGATIVE 06/01/2020   SARSCOV2NAA NEGATIVE 04/13/2020    CBC: Recent Labs  Lab 08/05/20 0430 08/06/20 0500 08/07/20 0550 08/08/20 0228 08/09/20 0451  WBC 11.8* 12.1* 14.7* 19.5* 18.3*  NEUTROABS 9.2* 9.1* 11.2* 15.0* 14.0*  HGB 11.7* 13.8 14.5 14.8 14.7  HCT 36.8* 43.7 44.7 45.5 44.7  MCV 94.8 94.0 93.7 93.4 92.9  PLT 184 226 226 251 255    Cardiac Enzymes: No results for input(s): CKTOTAL, CKMB,  CKMBINDEX, TROPONINI in the last 168 hours. BNP (last 3 results) No results for input(s): PROBNP in the last 8760 hours. CBG: No results for input(s): GLUCAP in the last 168 hours. D-Dimer: No results for input(s): DDIMER in the last 72 hours. Hgb A1c: No results for input(s): HGBA1C in the last 72 hours. Lipid Profile: No results for input(s): CHOL, HDL, LDLCALC, TRIG, CHOLHDL, LDLDIRECT in the last 72 hours. Thyroid function studies: No results for input(s): TSH, T4TOTAL, T3FREE, THYROIDAB in the last 72 hours.  Invalid input(s): FREET3 Anemia work up: No results for input(s): VITAMINB12, FOLATE, FERRITIN, TIBC, IRON, RETICCTPCT in the last 72 hours. Sepsis Labs: Recent Labs  Lab 08/04/20 0605 08/04/20 1505 08/05/20 0430 08/06/20 0500 08/07/20 0550 08/08/20 0228 08/09/20 0451  WBC  --  15.0*   < > 12.1* 14.7* 19.5* 18.3*  LATICACIDVEN 3.6* 3.5*  --  1.5  --   --   --    < > = values in this interval not displayed.    Microbiology Recent Results (from the past 240 hour(s))  Resp Panel by RT-PCR (Flu A&B, Covid) Nasopharyngeal Swab     Status: None   Collection Time: 08/04/20  2:55 AM   Specimen: Nasopharyngeal Swab; Nasopharyngeal(NP) swabs in vial transport medium  Result Value Ref Range Status   SARS Coronavirus 2 by RT PCR NEGATIVE NEGATIVE Final    Comment: (NOTE) SARS-CoV-2 target nucleic acids are NOT DETECTED.  The  SARS-CoV-2 RNA is generally detectable in upper respiratory specimens during the acute phase of infection. The lowest concentration of SARS-CoV-2 viral copies this assay can detect is 138 copies/mL. A negative result does not preclude SARS-Cov-2 infection and should not be used as the sole basis for treatment or other patient management decisions. A negative result may occur with  improper specimen collection/handling, submission of specimen other than nasopharyngeal swab, presence of viral mutation(s) within the areas targeted by this assay, and inadequate number of viral copies(<138 copies/mL). A negative result must be combined with clinical observations, patient history, and epidemiological information. The expected result is Negative.  Fact Sheet for Patients:  BloggerCourse.com  Fact Sheet for Healthcare Providers:  SeriousBroker.it  This test is no t yet approved or cleared by the [place] FDA and  has been authorized for detection and/or diagnosis of SARS-CoV-2 by FDA under an Emergency Use Authorization (EUA). This EUA will remain  in effect (meaning this test can be used) for the duration of the COVID-19 declaration under Section 564(b)(1) of the Act, 21 U.S.C.section 360bbb-3(b)(1), unless the authorization is terminated  or revoked sooner.       Influenza A by PCR NEGATIVE NEGATIVE Final   Influenza B by PCR NEGATIVE NEGATIVE Final    Comment: (NOTE) The Xpert Xpress SARS-CoV-2/FLU/RSV plus assay is intended as an aid in the diagnosis of influenza from Nasopharyngeal swab specimens and should not be used as a sole basis for treatment. Nasal washings and aspirates are unacceptable for Xpert Xpress SARS-CoV-2/FLU/RSV testing.  Fact Sheet for Patients: BloggerCourse.com  Fact Sheet for Healthcare Providers: SeriousBroker.it  This test is not yet approved or  cleared by the [place] FDA and has been authorized for detection and/or diagnosis of SARS-CoV-2 by FDA under an Emergency Use Authorization (EUA). This EUA will remain in effect (meaning this test can be used) for the duration of the COVID-19 declaration under Section 564(b)(1) of the Act, 21 U.S.C. section 360bbb-3(b)(1), unless the authorization is terminated or revoked.  Performed at [facility], [address], [place], [place] [address]   Culture, blood (routine x 2)     Status: None (Preliminary result)   Collection Time: 08/04/20  6:05 AM   Specimen: BLOOD LEFT HAND  Result Value Ref Range Status   Specimen Description BLOOD LEFT HAND  Final   Special Requests   Final    BOTTLES DRAWN AEROBIC AND ANAEROBIC Blood Culture results may not be optimal due to an inadequate volume of blood received in culture bottles   Culture   Final    NO GROWTH 4 DAYS Performed at [facility], [address], [place], [place] [address]    Report Status PENDING  Incomplete  Culture, blood (routine x 2)     Status: None (Preliminary result)   Collection Time: 08/04/20  6:10 AM   Specimen: BLOOD RIGHT HAND  Result Value Ref Range Status   Specimen Description BLOOD RIGHT HAND  Final   Special Requests   Final    BOTTLES DRAWN AEROBIC AND ANAEROBIC Blood Culture results may not be optimal due to an inadequate volume of blood received in culture bottles   Culture   Final    NO GROWTH 4 DAYS Performed at [facility], [address], [place], [place] [address]    Report Status PENDING  Incomplete     Medications:    apixaban  5 mg Oral BID   Chlorhexidine Gluconate Cloth  6 each Topical Daily   dapagliflozin propanediol  10 mg Oral Daily   digoxin  0.0625 mg Oral Daily   guaiFENesin  600 mg Oral BID   mexiletine  300 mg Oral Q12H   pantoprazole  40 mg Oral Daily   potassium chloride  80 mEq Oral TID   ranolazine  500 mg Oral BID   sodium chloride flush  10-40 mL  Intracatheter Q12H   Continuous Infusions:  amiodarone 30 mg/hr (08/09/20 0857)   ceFEPime (MAXIPIME) IV     magnesium sulfate bolus IVPB 2 g (08/09/20 0944)   milrinone 0.25 mcg/kg/min (08/09/20 0933)   vancomycin Stopped (08/08/20 1359)      LOS: 5 days   Marinda Elk  [facility]  08/09/2020, 10:25 AM

## 2020-08-09 NOTE — Progress Notes (Signed)
Pharmacy Antibiotic Note  Roberto [name] is a 55 y.o. male admitted on 07/25/2020 with cellulitis.  Pharmacy has been consulted for vancomycin and cefepime dosing.  Assessment: Patient currently afebrile, wbc 18. Scr trending up to 2.6. Cxs no growth to date  Vancomycin trough this morning was elevated at 36. Level indicates T1/2 closer to 48 hours, vancomycin likely to be therapeutic in system for longer than 48 hours if scr continues to worsen. Original protocol was indicated for 7 days of therapy which he will complete without further doses needed.   Plan: Will hold further vancomycin doses  Reduce Cefepime to 2g IV q12 hours Will follow up with primary team if we can stop antibiotics tomorrow as that would complete 7 days of treatment  Height: 5\' 8"  (172.7 cm) Weight: (!) 146.2 kg (322 lb 4.8 oz) IBW/kg (Calculated) : 68.4  Temp (24hrs), Avg:97.6 F (36.4 C), Min:97.5 F (36.4 C), Max:98 F (36.7 C)  Recent Labs  Lab 08/04/20 0605 08/04/20 1505 08/05/20 0430 08/06/20 0500 08/07/20 0550 08/07/20 1638 08/08/20 0228 08/09/20 0451  WBC  --  15.0* 11.8* 12.1* 14.7*  --  19.5* 18.3*  CREATININE  --  2.40* 1.96* 2.07* 1.95* 2.00* 2.27* 2.60*  LATICACIDVEN 3.6* 3.5*  --  1.5  --   --   --   --      Estimated Creatinine Clearance: 45.7 mL/min (A) (by C-G formula based on SCr of 2.6 mg/dL (H)).    Allergies  Allergen Reactions   Shrimp [Shellfish Allergy] Hives and Itching   Thank you for allowing pharmacy to participate in this patient's care.  08/11/20 PharmD., BCPS Clinical Pharmacist 08/09/2020 11:15 AM

## 2020-08-09 NOTE — Progress Notes (Addendum)
Patient ID: Roberto [name], male   DOB: 1965/07/22, 55 y.o.   MRN: [id number]     Advanced Heart Failure Rounding Note  PCP-Cardiologist: Glori Bickers, MD   Subjective:   -Co-Ox 41.5% > 45% > 53% on 0.25 milrinone -Amio gtt started 07/26 d/t increased PVCs/NSVT   CVP 7. Diuresing with lasix gtt and metolazone. Weight down 12 lb overnight. Reports emptying colostomy bag 7 X yesterday (unmeasured).   Feels better. Less leg swelling. No dyspnea at rest. Energy improving.  Creatinine tending up 1.95  > 2.00 > 2.27 > 2.60.  He remains on vancomycin/cefepime for RLE cellulitis.  Blood cultures NGTD.     Objective:   Weight Range: (!) 146.2 kg Body mass index is 49.01 kg/m.   Vital Signs:   Temp:  [97.5 F (36.4 C)-98.3 F (36.8 C)] 97.5 F (36.4 C) (07/28 0440) Pulse Rate:  [83-95] 83 (07/28 0441) Resp:  [17-24] 20 (07/28 0441) BP: (74-107)/(53-69) 94/56 (07/28 0441) SpO2:  [88 %-95 %] 93 % (07/28 0441) Weight:  [146.2 kg] 146.2 kg (07/28 0440) Last BM Date: 08/08/20  Weight change: Filed Weights   08/07/20 0500 08/08/20 0635 08/09/20 0440  Weight: (!) 151.6 kg (!) 151.6 kg (!) 146.2 kg    Intake/Output:   Intake/Output Summary (Last 24 hours) at 08/09/2020 0745 Last data filed at 08/09/2020 0600 Gross per 24 hour  Intake 2405.15 ml  Output 3395 ml  Net -989.85 ml      Physical Exam  CVP 7 General:  Sitting up in chair. No resp difficulty. Comfortable on RA. HEENT: normal Neck: supple. No JVD. Carotids 2+ bilat; no bruits. No lymphadenopathy or thryomegaly appreciated. Cor: PMI nondisplaced. Regular rhythm with frequent ectopy. No rubs, gallops or murmurs. Lungs: CTA Abdomen: soft, nontender, nondistended. No hepatosplenomegaly. No bruits or masses. Good bowel sounds. Extremities: no cyanosis, clubbing, rash, RLE erythema. Trace left LE edema, 1+ on RLE Neuro: alert & orientedx3, cranial nerves grossly intact. moves all 4 extremities w/o difficulty. Affect  pleasant  Telemetry   SR with very frequent PVCs and runs of NSVT Labs    CBC Recent Labs    08/08/20 0228 08/09/20 0451  WBC 19.5* 18.3*  NEUTROABS 15.0* 14.0*  HGB 14.8 14.7  HCT 45.5 44.7  MCV 93.4 92.9  PLT 251 165   Basic Metabolic Panel Recent Labs    08/08/20 0228 08/09/20 0451  NA 135 132*  K 3.7 3.7  CL 99 101  CO2 23 21*  GLUCOSE 185* 167*  BUN 30* 35*  CREATININE 2.27* 2.60*  CALCIUM 8.7* 8.3*  MG 2.2 1.9   Liver Function Tests Recent Labs    08/07/20 0550 08/08/20 0228  AST 20 26  ALT 34 30  ALKPHOS 52 56  BILITOT 1.3* 1.0  PROT 6.5 6.9  ALBUMIN 1.9* 2.0*   No results for input(s): LIPASE, AMYLASE in the last 72 hours. Cardiac Enzymes No results for input(s): CKTOTAL, CKMB, CKMBINDEX, TROPONINI in the last 72 hours.  BNP: BNP (last 3 results) Recent Labs    07/19/20 0222 07/23/20 1117 07/23/2020 1707  BNP 959.9* 992.0* 1,426.8*    ProBNP (last 3 results) No results for input(s): PROBNP in the last 8760 hours.   D-Dimer No results for input(s): DDIMER in the last 72 hours. Hemoglobin A1C No results for input(s): HGBA1C in the last 72 hours. Fasting Lipid Panel No results for input(s): CHOL, HDL, LDLCALC, TRIG, CHOLHDL, LDLDIRECT in the last 72 hours. Thyroid Function Tests No results  for input(s): TSH, T4TOTAL, T3FREE, THYROIDAB in the last 72 hours.  Invalid input(s): FREET3  Other results:   Imaging    No results found.   Medications:     Scheduled Medications:  apixaban  5 mg Oral BID   Chlorhexidine Gluconate Cloth  6 each Topical Daily   dapagliflozin propanediol  10 mg Oral Daily   digoxin  0.0625 mg Oral Daily   guaiFENesin  600 mg Oral BID   mexiletine  300 mg Oral Q12H   pantoprazole  40 mg Oral Daily   potassium chloride  60 mEq Oral BID   ranolazine  500 mg Oral BID   sodium chloride flush  10-40 mL Intracatheter Q12H    Infusions:  amiodarone 30 mg/hr (08/08/20 2017)   ceFEPime (MAXIPIME) IV 2 g  (08/09/20 0503)   furosemide (LASIX) 200 mg in dextrose 5% 100 mL (34m/mL) infusion 12 mg/hr (08/08/20 1614)   milrinone 0.25 mcg/kg/min (08/09/20 0026)   vancomycin Stopped (08/08/20 1359)    PRN Medications: acetaminophen **OR** acetaminophen, benzonatate, guaiFENesin-dextromethorphan, ondansetron (ZOFRAN) IV, sodium chloride flush   Assessment/Plan   1. Acute on chronic combined CHF:  -NICM.  Echo 12/21 with EF 25-30%, moderate LV dilation. Has medtronic ICD.  Patient sent from office visit 07/26/2020 with cellulitis, as well as acute on chronic combined CHF. Lactate elevated to 3.6. Suspect cellulitis with sepsis in setting of acute/chronic systolic CHF. Nearing end-stage.  -Co-ox 53% on 0.25 milrinone. Try to wean milrinone tomorrow if Co-ox stable. -CVP 7. Down 12 lb overnight. Is/Os not accurate. Patient reports unmeasured outputs. Creatinine 2.0 > 2.27 > 2.60.  -No additional metolazone. Stop lasix gtt. Add po torsemide once renal function improves. -Recheck BMP later today -Continue to monitor electrolytes closely and replete as needed to maintain K >4, Mg >2 -Hold carvedilol and spironolactone in the setting of decompensated CHF and AKI -Continue digoxin and farxiga - may need to hold if renal function continues to worsen. Dig level today. -Had discussion about GOC given recurrent HF exacerbations. Not a candidate for advanced therapies given size and ileostomy. He wishes to remain full code. Met with palliative care last admit, not interested in seeing them again at this point.  2. RLE cellulitis:  -Severe sepsis. Patient injured his leg PTA. No evidence of osteomyelitis or abscess on CT.  -On IV cefepime and vancomycin. -Lactic acid trending down.  -Continues with leukocytosis -Continue management per primary team  3. History of VT:  -Admission 05/2020 for VT with recurrence during admission, felt to be driving by electrolyte imbalance. Seen by EP and started on amiodarone and  mexiletine with no evidence of recurrence on last interrogation 07/19/20. Patient denies ICD shocks -Very frequent PVCs and runs of NSVT on tele. Continue to monitor. -Continue amio gtt at 30/hr -Continue mexiletine -Continue to monitor electrolytes closely and replete as needed to maintain K >4, Mg >2  4. History of LV thrombus:  -Continue Eliquis -No evidence of bleeding. Hgb stable.  5. Atrial flutter s/p ablation:  -In SR -Continue eliquis.  -Continue amiodarone for rhythm control  6. AKI on CKD stage 3b:  -Variable baseline creatinine 1.8-2.5.   -2.7 on admit, down to 1.95. Now 2.0 > 2.27 > 2.6. -Stop diuresis. BMP this afternoon. -Likely cardiorenal  7. Diverticulitis s/p ileostomy   Length of Stay: 5  FINCH, LINDSAY N, PA-C  08/09/2020, 7:45 AM  Advanced Heart Failure Team Pager [phone](M-F; 7a - 5p)  Please contact [facility] for night-coverage  after hours (5p -7a ) and weekends on [email or web address]  Patient seen with PA, agree with the above note.    Weight down but creatinine up to 2.6.  CVP on my read is around 10.    General: NAD Neck: Thick, JVP difficult, no thyromegaly or thyroid nodule.  Lungs: Clear to auscultation bilaterally with normal respiratory effort. CV: Nonpalpable PMI.  Heart regular S1/S2, no S3/S4, no murmur.  Trace ankle edema left, right leg wrapped.  Abdomen: Soft, nontender, no hepatosplenomegaly, no distention.  Skin: Cellulitis right lower leg.   Neurologic: Alert and oriented x 3.  Psych: Normal affect. Extremities: No clubbing or cyanosis.  HEENT: Normal.   Suspect we have him as diuresed as we can safely get him, creatinine has bumped.  - Continue milrinone 0.25 today.  - Stop IV lasix.  - Po diuretic tomorrow if creatinine stabilizes.  - Will need to eventually titrate off milrinone.   Loralie Champagne 08/09/2020 2:54 PM

## 2020-08-10 DIAGNOSIS — N1832 Chronic kidney disease, stage 3b: Secondary | ICD-10-CM | POA: Diagnosis not present

## 2020-08-10 DIAGNOSIS — I5043 Acute on chronic combined systolic (congestive) and diastolic (congestive) heart failure: Secondary | ICD-10-CM | POA: Diagnosis not present

## 2020-08-10 DIAGNOSIS — L03115 Cellulitis of right lower limb: Secondary | ICD-10-CM | POA: Diagnosis not present

## 2020-08-10 DIAGNOSIS — N179 Acute kidney failure, unspecified: Secondary | ICD-10-CM | POA: Diagnosis not present

## 2020-08-10 LAB — BASIC METABOLIC PANEL
Anion gap: 9 (ref 5–15)
BUN: 37 mg/dL — ABNORMAL HIGH (ref 6–20)
CO2: 19 mmol/L — ABNORMAL LOW (ref 22–32)
Calcium: 8.5 mg/dL — ABNORMAL LOW (ref 8.9–10.3)
Chloride: 100 mmol/L (ref 98–111)
Creatinine, Ser: 2.94 mg/dL — ABNORMAL HIGH (ref 0.61–1.24)
GFR, Estimated: 25 mL/min — ABNORMAL LOW (ref >60)
Glucose, Bld: 187 mg/dL — ABNORMAL HIGH (ref 70–99)
Potassium: 5 mmol/L (ref 3.5–5.1)
Sodium: 128 mmol/L — ABNORMAL LOW (ref 135–145)

## 2020-08-10 LAB — CBC WITH DIFFERENTIAL/PLATELET
Abs Immature Granulocytes: 1.42 10*3/uL — ABNORMAL HIGH (ref 0.00–0.07)
Basophils Absolute: 0.2 10*3/uL — ABNORMAL HIGH (ref 0.0–0.1)
Basophils Relative: 1 %
Eosinophils Absolute: 0.4 10*3/uL (ref 0.0–0.5)
Eosinophils Relative: 2 %
HCT: 42.6 % (ref 39.0–52.0)
Hemoglobin: 13.7 g/dL (ref 13.0–17.0)
Immature Granulocytes: 9 %
Lymphocytes Relative: 7 %
Lymphs Abs: 1 10*3/uL (ref 0.7–4.0)
MCH: 29.9 pg (ref 26.0–34.0)
MCHC: 32.2 g/dL (ref 30.0–36.0)
MCV: 93 fL (ref 80.0–100.0)
Monocytes Absolute: 1.5 10*3/uL — ABNORMAL HIGH (ref 0.1–1.0)
Monocytes Relative: 9 %
Neutro Abs: 11.2 10*3/uL — ABNORMAL HIGH (ref 1.7–7.7)
Neutrophils Relative %: 72 %
Platelets: 246 10*3/uL (ref 150–400)
RBC: 4.58 MIL/uL (ref 4.22–5.81)
RDW: 15.8 % — ABNORMAL HIGH (ref 11.5–15.5)
WBC: 15.8 10*3/uL — ABNORMAL HIGH (ref 4.0–10.5)
nRBC: 0.2 % (ref 0.0–0.2)

## 2020-08-10 LAB — COOXEMETRY PANEL
Carboxyhemoglobin: 1.2 % (ref 0.5–1.5)
Methemoglobin: 0.9 % (ref 0.0–1.5)
O2 Saturation: 53.2 %
Total hemoglobin: 14.4 g/dL (ref 12.0–16.0)

## 2020-08-10 LAB — MAGNESIUM: Magnesium: 2.2 mg/dL (ref 1.7–2.4)

## 2020-08-10 LAB — DIGOXIN LEVEL: Digoxin Level: 0.9 ng/mL (ref 0.8–2.0)

## 2020-08-10 MED ORDER — SODIUM CHLORIDE 0.9 % IV BOLUS
250.0000 mL | Freq: Once | INTRAVENOUS | Status: AC
  Administered 2020-08-10: 250 mL via INTRAVENOUS

## 2020-08-10 MED ORDER — ALTEPLASE 2 MG IJ SOLR
2.0000 mg | Freq: Once | INTRAMUSCULAR | Status: AC
  Administered 2020-08-10: 2 mg

## 2020-08-10 MED ORDER — LINEZOLID 600 MG PO TABS
600.0000 mg | ORAL_TABLET | Freq: Two times a day (BID) | ORAL | Status: AC
  Administered 2020-08-10 – 2020-08-13 (×7): 600 mg via ORAL
  Filled 2020-08-10 (×7): qty 1

## 2020-08-10 NOTE — Progress Notes (Signed)
[facility] PROGRESS NOTE    Progress Note  JULEZ ZONG  LEX:[id number] DOB: 04-23-1965 DOA: 07/19/2020 PCP: [name], MD     Brief Narrative:   Roberto [name] is an 55 y.o. male past medical history of chronic systolic heart failure with a 2D echo in December 2021 that showed an EF of 25% grade 3 diastolic dysfunction recently admitted and discharged on 07/21/2020 for systolic heart failure, he noticed some right lower extremity redness and some discharge from the wound in the ER was found to be with systolic blood pressure of 80 and concerns for right lower extremity cellulitis with imaging concerning for heart failure.  BNP of 1400 creatinine of 2.7 white blood cell count of 15    Assessment/Plan:   Acute on chronic combined systolic and diastolic CHF (congestive heart failure) (HCC) Advanced heart failure team on board Inaccurate CVP's, his weight is trending up now 148 kg Creatinine is trending up, sodium is going down. Currently on oral milrinone.  Torsemide has been held. Currently on milrinone, having frequent PVCs. Patient has been weaned to room air. The advanced heart failure team recommended to reconsult palliative care to discuss end-of-life. Extremely poor prognosis refuses to meet with palliative care team. Patient has unrealistic expectations  Acute kidney injury on chronic kidney disease stage IIIb: With a baseline creatinine around 2, creatinine continues to rise despite medical therapy.  His creatinine continues to rise now is 3, with a potassium of 5 and becoming hyponatremic. Patient has an extremely poor prognosis he is currently on milrinone magnesium is 2.2.  Right lower ext cellulitis: No osteomyelitis seen on imaging Doppler negative for DVT. Leg continues to be erythematous warm to touch, persistent leukocytosis. Continue IV vancomycin and cefepime  Electrolyte imbalance: Potassium 5 today creatinine is trending up, will not give any  further potassium.  History of VT/IVCD placement: Continue amiodarone drip monitor electrolytes closely.  History of a flutter status post ablation:  continue amiodarone and Eliquis.  History of LV thrombus, Continue Eliquis.      DVT prophylaxis: eliquis Family Communication:none Status is: Inpatient  Remains inpatient appropriate because:Hemodynamically unstable  Dispo: The patient is from: Home              Anticipated d/c is to: Home              Patient currently is not medically stable to d/c.   Difficult to place patient No   Code Status:     Code Status Orders  (From admission, onward)           Start     Ordered   08/04/20 0602  Full code  Continuous        08/04/20 0602           Code Status History     Date Active Date Inactive Code Status Order ID Comments User Context   07/19/2020 0446 07/21/2020 2000 Full Code [id number]  Hillary Bow, DO ED   06/01/2020 1148 06/10/2020 2117 Full Code [id number]  Graciella Freer, PA-C ED   04/13/2020 1429 04/19/2020 1944 Full Code [id number]  Clydie Braun, MD Inpatient   12/20/2018 2342 01/24/2019 2317 Full Code [id number]  Rometta Emery, MD Inpatient   09/03/2018 1222 09/04/2018 1736 Full Code [id number]  Regan Lemming, MD Inpatient   03/21/2017 0419 03/26/2017 2255 Full Code [id number]  Ernest Mallick, MD ED   03/21/2017 0417 03/21/2017 0419 Full Code [id number]  Ernest Mallick, MD  ED   11/17/2016 1443 11/17/2016 2257 Full Code [id number]  Regan Lemming, MD Inpatient   10/14/2016 1908 10/21/2016 1919 Full Code [id number]  Little Ishikawa, NP Inpatient         IV Access:   Peripheral IV   Procedures and diagnostic studies:   No results found.   Medical Consultants:   None.   Subjective:    KAVAUGHN FAUCETT l no complaints  Objective:    Vitals:   08/09/20 2000 08/09/20 2350 08/10/20 0330 08/10/20 0745  BP:  109/73 97/64 112/66  Pulse:  83 81 88  Resp:  20 18 17   Temp:  98.4 F  (36.9 C) 98.1 F (36.7 C) 97.7 F (36.5 C)  TempSrc:  Oral Oral Oral  SpO2: 99% 95% 90% 93%  Weight:   (!) 148.4 kg   Height:       SpO2: 93 % O2 Flow Rate (L/min): 2 L/min   Intake/Output Summary (Last 24 hours) at 08/10/2020 08/12/2020 Last data filed at 08/10/2020 0100 Gross per 24 hour  Intake 1127.11 ml  Output 1825 ml  Net -697.89 ml    Filed Weights   08/08/20 0635 08/09/20 0440 08/10/20 0330  Weight: (!) 151.6 kg (!) 146.2 kg (!) 148.4 kg    Exam: General exam: In no acute distress. Respiratory system: Good air movement and clear to auscultation. Cardiovascular system: S1 & S2 heard, RRR. No JVD. Gastrointestinal system: Abdomen is nondistended, soft and nontender.  Extremities: No pedal edema. Skin: Erythema is receding, lower extremity still shiny swelling but not weeping not tender to touch.   Data Reviewed:    Labs: Basic Metabolic Panel: Recent Labs  Lab 08/06/20 0500 08/07/20 0550 08/07/20 1638 08/08/20 0228 08/09/20 0451 08/09/20 1315 08/10/20 0440  NA 137 135 131* 135 132* 132* 128*  K 3.6 3.6 3.4* 3.7 3.7 3.8 5.0  CL 103 102 100 99 101 96* 100  CO2 25 23 21* 23 21* 20* 19*  GLUCOSE 124* 130* 229* 185* 167* 261* 187*  BUN 29* 26* 27* 30* 35* 37* 37*  CREATININE 2.07* 1.95* 2.00* 2.27* 2.60* 2.78* 2.94*  CALCIUM 8.3* 8.1* 8.0* 8.7* 8.3* 8.5* 8.5*  MG 2.0 2.0  --  2.2 1.9  --  2.2    GFR Estimated Creatinine Clearance: 40.8 mL/min (A) (by C-G formula based on SCr of 2.94 mg/dL (H)). Liver Function Tests: Recent Labs  Lab 08/04/20 1505 08/05/20 0430 08/06/20 0500 08/07/20 0550 08/08/20 0228  AST 26 21 20 20 26   ALT 57* 43 40 34 30  ALKPHOS 56 62 51 52 56  BILITOT 1.2 1.0 1.1 1.3* 1.0  PROT 6.5 5.6* 6.4* 6.5 6.9  ALBUMIN 2.1* 1.7* 1.9* 1.9* 2.0*    No results for input(s): LIPASE, AMYLASE in the last 168 hours. No results for input(s): AMMONIA in the last 168 hours. Coagulation profile No results for input(s): INR, PROTIME in the  last 168 hours. COVID-19 Labs  No results for input(s): DDIMER, FERRITIN, LDH, CRP in the last 72 hours.  Lab Results  Component Value Date   SARSCOV2NAA NEGATIVE 08/04/2020   SARSCOV2NAA NEGATIVE 07/19/2020   SARSCOV2NAA NEGATIVE 06/01/2020   SARSCOV2NAA NEGATIVE 04/13/2020    CBC: Recent Labs  Lab 08/06/20 0500 08/07/20 0550 08/08/20 0228 08/09/20 0451 08/10/20 0440  WBC 12.1* 14.7* 19.5* 18.3* 15.8*  NEUTROABS 9.1* 11.2* 15.0* 14.0* 11.2*  HGB 13.8 14.5 14.8 14.7 13.7  HCT 43.7 44.7 45.5 44.7 42.6  MCV 94.0 93.7 93.4  92.9 93.0  PLT 226 226 251 255 246    Cardiac Enzymes: No results for input(s): CKTOTAL, CKMB, CKMBINDEX, TROPONINI in the last 168 hours. BNP (last 3 results) No results for input(s): PROBNP in the last 8760 hours. CBG: No results for input(s): GLUCAP in the last 168 hours. D-Dimer: No results for input(s): DDIMER in the last 72 hours. Hgb A1c: No results for input(s): HGBA1C in the last 72 hours. Lipid Profile: No results for input(s): CHOL, HDL, LDLCALC, TRIG, CHOLHDL, LDLDIRECT in the last 72 hours. Thyroid function studies: No results for input(s): TSH, T4TOTAL, T3FREE, THYROIDAB in the last 72 hours.  Invalid input(s): FREET3 Anemia work up: No results for input(s): VITAMINB12, FOLATE, FERRITIN, TIBC, IRON, RETICCTPCT in the last 72 hours. Sepsis Labs: Recent Labs  Lab 08/04/20 0605 08/04/20 1505 08/05/20 0430 08/06/20 0500 08/07/20 0550 08/08/20 0228 08/09/20 0451 08/10/20 0440  WBC  --  15.0*   < > 12.1* 14.7* 19.5* 18.3* 15.8*  LATICACIDVEN 3.6* 3.5*  --  1.5  --   --   --   --    < > = values in this interval not displayed.    Microbiology Recent Results (from the past 240 hour(s))  Resp Panel by RT-PCR (Flu A&B, Covid) Nasopharyngeal Swab     Status: None   Collection Time: 08/04/20  2:55 AM   Specimen: Nasopharyngeal Swab; Nasopharyngeal(NP) swabs in vial transport medium  Result Value Ref Range Status   SARS Coronavirus  2 by RT PCR NEGATIVE NEGATIVE Final    Comment: (NOTE) SARS-CoV-2 target nucleic acids are NOT DETECTED.  The SARS-CoV-2 RNA is generally detectable in upper respiratory specimens during the acute phase of infection. The lowest concentration of SARS-CoV-2 viral copies this assay can detect is 138 copies/mL. A negative result does not preclude SARS-Cov-2 infection and should not be used as the sole basis for treatment or other patient management decisions. A negative result may occur with  improper specimen collection/handling, submission of specimen other than nasopharyngeal swab, presence of viral mutation(s) within the areas targeted by this assay, and inadequate number of viral copies(<138 copies/mL). A negative result must be combined with clinical observations, patient history, and epidemiological information. The expected result is Negative.  Fact Sheet for Patients:  BloggerCourse.com  Fact Sheet for Healthcare Providers:  SeriousBroker.it  This test is no t yet approved or cleared by the [place] FDA and  has been authorized for detection and/or diagnosis of SARS-CoV-2 by FDA under an Emergency Use Authorization (EUA). This EUA will remain  in effect (meaning this test can be used) for the duration of the COVID-19 declaration under Section 564(b)(1) of the Act, 21 U.S.C.section 360bbb-3(b)(1), unless the authorization is terminated  or revoked sooner.       Influenza A by PCR NEGATIVE NEGATIVE Final   Influenza B by PCR NEGATIVE NEGATIVE Final    Comment: (NOTE) The Xpert Xpress SARS-CoV-2/FLU/RSV plus assay is intended as an aid in the diagnosis of influenza from Nasopharyngeal swab specimens and should not be used as a sole basis for treatment. Nasal washings and aspirates are unacceptable for Xpert Xpress SARS-CoV-2/FLU/RSV testing.  Fact Sheet for Patients: BloggerCourse.com  Fact  Sheet for Healthcare Providers: SeriousBroker.it  This test is not yet approved or cleared by the [place] FDA and has been authorized for detection and/or diagnosis of SARS-CoV-2 by FDA under an Emergency Use Authorization (EUA). This EUA will remain in effect (meaning this test can be used) for the duration  of the COVID-19 declaration under Section 564(b)(1) of the Act, 21 U.S.C. section 360bbb-3(b)(1), unless the authorization is terminated or revoked.  Performed at [facility], [address] [place], [place] [address]   Culture, blood (routine x 2)     Status: None   Collection Time: 08/04/20  6:05 AM   Specimen: BLOOD LEFT HAND  Result Value Ref Range Status   Specimen Description BLOOD LEFT HAND  Final   Special Requests   Final    BOTTLES DRAWN AEROBIC AND ANAEROBIC Blood Culture results may not be optimal due to an inadequate volume of blood received in culture bottles   Culture   Final    NO GROWTH 5 DAYS Performed at [facility], [address] [place], [place] [address]    Report Status 08/09/2020 FINAL  Final  Culture, blood (routine x 2)     Status: None   Collection Time: 08/04/20  6:10 AM   Specimen: BLOOD RIGHT HAND  Result Value Ref Range Status   Specimen Description BLOOD RIGHT HAND  Final   Special Requests   Final    BOTTLES DRAWN AEROBIC AND ANAEROBIC Blood Culture results may not be optimal due to an inadequate volume of blood received in culture bottles   Culture   Final    NO GROWTH 5 DAYS Performed at [facility], [address] [place], [place] [address]    Report Status 08/09/2020 FINAL  Final     Medications:    apixaban  5 mg Oral BID   Chlorhexidine Gluconate Cloth  6 each Topical Daily   dapagliflozin propanediol  10 mg Oral Daily   digoxin  0.0625 mg Oral Daily   guaiFENesin  600 mg Oral BID   mexiletine  300 mg Oral Q12H   pantoprazole  40 mg Oral Daily   ranolazine  500 mg Oral  BID   sodium chloride flush  10-40 mL Intracatheter Q12H   vancomycin variable dose per unstable renal function (pharmacist dosing)   Does not apply See admin instructions   Continuous Infusions:  amiodarone 30 mg/hr (08/09/20 2213)   ceFEPime (MAXIPIME) IV 2 g (08/10/20 0902)   milrinone 0.25 mcg/kg/min (08/09/20 2028)      LOS: 6 days   Marinda Elk  [facility]  08/10/2020, 9:37 AM

## 2020-08-10 NOTE — Progress Notes (Addendum)
Patient ID: Roberto [name], male   DOB: 07-Jul-1965, 55 y.o.   MRN: [id number]     Advanced Heart Failure Rounding Note  PCP-Cardiologist: Glori Bickers, MD   Subjective:   -Yesterday had worsening renal function so diuretics stopped.   Creatinine 2.6>2.8>2.9   On vancomycin/cefepime for RLE cellulitis.    Blood cultures NGTD.    Feeling a little better today.    Objective:   Weight Range: (!) 148.4 kg Body mass index is 49.75 kg/m.   Vital Signs:   Temp:  [97.6 F (36.4 C)-98.4 F (36.9 C)] 97.7 F (36.5 C) (07/29 0745) Pulse Rate:  [81-88] 88 (07/29 0745) Resp:  [17-20] 17 (07/29 0745) BP: (97-119)/(59-74) 112/66 (07/29 0745) SpO2:  [90 %-99 %] 93 % (07/29 0745) Weight:  [148.4 kg] 148.4 kg (07/29 0330) Last BM Date: 08/09/20  Weight change: Filed Weights   08/08/20 0635 08/09/20 0440 08/10/20 0330  Weight: (!) 151.6 kg (!) 146.2 kg (!) 148.4 kg    Intake/Output:   Intake/Output Summary (Last 24 hours) at 08/10/2020 1007 Last data filed at 08/10/2020 0100 Gross per 24 hour  Intake 1127.11 ml  Output 1500 ml  Net -372.89 ml     CVP 5-6 personally checked  Physical Exam  General:  Sitting in the chair.  No resp difficulty HEENT: normal Neck: supple. no JVD. Carotids 2+ bilat; no bruits. No lymphadenopathy or thryomegaly appreciated. Cor: PMI nondisplaced. Regular rate & rhythm. No rubs, gallops or murmurs. Lungs: clear Abdomen: soft, nontender, nondistended. No hepatosplenomegaly. No bruits or masses. Good bowel sounds. Extremities: no cyanosis, clubbing, rash, RLE 1+ edema LLE trace . RUE PICC  Neuro: alert & orientedx3, cranial nerves grossly intact. moves all 4 extremities w/o difficulty. Affect pleasant  Telemetry   SR with PVCs  Labs    CBC Recent Labs    08/09/20 0451 08/10/20 0440  WBC 18.3* 15.8*  NEUTROABS 14.0* 11.2*  HGB 14.7 13.7  HCT 44.7 42.6  MCV 92.9 93.0  PLT 255 710   Basic Metabolic Panel Recent Labs     08/09/20 0451 08/09/20 1315 08/10/20 0440  NA 132* 132* 128*  K 3.7 3.8 5.0  CL 101 96* 100  CO2 21* 20* 19*  GLUCOSE 167* 261* 187*  BUN 35* 37* 37*  CREATININE 2.60* 2.78* 2.94*  CALCIUM 8.3* 8.5* 8.5*  MG 1.9  --  2.2   Liver Function Tests Recent Labs    08/08/20 0228  AST 26  ALT 30  ALKPHOS 56  BILITOT 1.0  PROT 6.9  ALBUMIN 2.0*   No results for input(s): LIPASE, AMYLASE in the last 72 hours. Cardiac Enzymes No results for input(s): CKTOTAL, CKMB, CKMBINDEX, TROPONINI in the last 72 hours.  BNP: BNP (last 3 results) Recent Labs    07/19/20 0222 07/23/20 1117 07/15/2020 1707  BNP 959.9* 992.0* 1,426.8*    ProBNP (last 3 results) No results for input(s): PROBNP in the last 8760 hours.   D-Dimer No results for input(s): DDIMER in the last 72 hours. Hemoglobin A1C No results for input(s): HGBA1C in the last 72 hours. Fasting Lipid Panel No results for input(s): CHOL, HDL, LDLCALC, TRIG, CHOLHDL, LDLDIRECT in the last 72 hours. Thyroid Function Tests No results for input(s): TSH, T4TOTAL, T3FREE, THYROIDAB in the last 72 hours.  Invalid input(s): FREET3  Other results:   Imaging    No results found.   Medications:     Scheduled Medications:  apixaban  5 mg Oral BID  Chlorhexidine Gluconate Cloth  6 each Topical Daily   dapagliflozin propanediol  10 mg Oral Daily   digoxin  0.0625 mg Oral Daily   guaiFENesin  600 mg Oral BID   mexiletine  300 mg Oral Q12H   pantoprazole  40 mg Oral Daily   ranolazine  500 mg Oral BID   sodium chloride flush  10-40 mL Intracatheter Q12H   vancomycin variable dose per unstable renal function (pharmacist dosing)   Does not apply See admin instructions    Infusions:  amiodarone 30 mg/hr (08/09/20 2213)   ceFEPime (MAXIPIME) IV 2 g (08/10/20 0902)   milrinone 0.25 mcg/kg/min (08/09/20 2028)    PRN Medications: acetaminophen **OR** acetaminophen, benzonatate, guaiFENesin-dextromethorphan, ondansetron  (ZOFRAN) IV, sodium chloride flush   Assessment/Plan   1. Acute on chronic combined CHF:  -NICM.  Echo 12/21 with EF 25-30%, moderate LV dilation. Has medtronic ICD.  Patient sent from office visit 07/18/2020 with cellulitis, as well as acute on chronic combined CHF. Lactate elevated to 3.6. Suspect cellulitis with sepsis in setting of acute/chronic systolic CHF. Nearing end-stage.  Diuretic stopped 7/28 due to worsening renal function. Creatinine trending up 2.6>2.9.  Todays CVP 5-6. Appears dry. Continue to hold diuretics. Give back 250 cc NS back.  -CO-OX  53% on 0.25 milrinone.  -Hold carvedilol and spironolactone in the setting of decompensated CHF and AKI -Dig level 0.9. Stop digoxin.  - Stop farxiga  -Had discussion about Solomon given recurrent HF exacerbations. Not a candidate for advanced therapies given size and ileostomy. He wishes to remain full code. Met with palliative care last admit, not interested in seeing them again at this point.  2. RLE cellulitis:  -Severe sepsis. Patient injured his leg PTA. No evidence of osteomyelitis or abscess on CT.  -On IV cefepime and vancomycin. --WBC trending down.  -Continue management per primary team  3. History of VT:  -Admission 05/2020 for VT with recurrence during admission, felt to be driving by electrolyte imbalance. Seen by EP and started on amiodarone and mexiletine with no evidence of recurrence on last interrogation 07/19/20. Patient denies ICD shocks -Very frequent PVCs and runs of NSVT on tele. Continue to monitor. -Continue amio gtt at 30/hr -Continue mexiletine -Continue to monitor electrolytes closely and replete as needed to maintain K >4, Mg >2  4. History of LV thrombus:  -Continue Eliquis -No evidence of bleeding. Hgb stable.  5. Atrial flutter s/p ablation:  -In SR.  -Continue eliquis.  -Continue amiodarone for rhythm control  6. AKI on CKD stage 3b:  -Variable baseline creatinine 1.8-2.5.   -2.7 on admit, down to  1.95. Now 2.0 > 2.27 > 2.6>>2.9  -Off diuretics. Give 250 cc NS IV now.  -Likely cardiorenal  7. Diverticulitis s/p ileostomy   Length of Stay: 6  Amy Clegg, NP  08/10/2020, 10:07 AM  Advanced Heart Failure Team Pager [phone] (M-F; 7a - 5p)  Please contact [facility] for night-coverage after hours (5p -7a ) and weekends on [email or web address]  Patient seen with NP, agree with the above note.   Much fewer PVCs today.  CVP low in the 5 range.  Creatinine higher at 2.9.  Co-ox still 53% despite milrinone 0.25.   General: Obese Neck: Thick. No JVD, no thyromegaly or thyroid nodule.  Lungs: Clear to auscultation bilaterally with normal respiratory effort. CV: Nonpalpable PMI.  Heart regular S1/S2, no S3/S4, no murmur.  1+ edema to knees.  Abdomen: Soft, nontender, no hepatosplenomegaly, no distention.  Skin: Intact  without lesions or rashes.  Neurologic: Alert and oriented x 3.  Psych: Normal affect. Extremities: No clubbing or cyanosis.  HEENT: Normal.   Volume status better.  Hold diuretics today, probably will need to resume tomorrow if creatinine trends down.  Will keep milrinone 0.25 for now while we wait for renal function to settle down. Unfortunately, he is not a good candidate for advanced therapies.  Will aim to slowly wean off milrinone.    Less PVCs, continue amiodarone gtt while on milrinone.   Cellulitis treatment per [facility].   Loralie Champagne 08/10/2020 3:21 PM

## 2020-08-11 DIAGNOSIS — N179 Acute kidney failure, unspecified: Secondary | ICD-10-CM

## 2020-08-11 DIAGNOSIS — I5043 Acute on chronic combined systolic (congestive) and diastolic (congestive) heart failure: Secondary | ICD-10-CM | POA: Diagnosis not present

## 2020-08-11 LAB — CBC WITH DIFFERENTIAL/PLATELET
Abs Immature Granulocytes: 0.4 10*3/uL — ABNORMAL HIGH (ref 0.00–0.07)
Band Neutrophils: 3 %
Basophils Absolute: 0.1 10*3/uL (ref 0.0–0.1)
Basophils Relative: 1 %
Eosinophils Absolute: 0.1 10*3/uL (ref 0.0–0.5)
Eosinophils Relative: 1 %
HCT: 38.6 % — ABNORMAL LOW (ref 39.0–52.0)
Hemoglobin: 11.9 g/dL — ABNORMAL LOW (ref 13.0–17.0)
Lymphocytes Relative: 1 %
Lymphs Abs: 0.1 10*3/uL — ABNORMAL LOW (ref 0.7–4.0)
MCH: 29.9 pg (ref 26.0–34.0)
MCHC: 30.8 g/dL (ref 30.0–36.0)
MCV: 97 fL (ref 80.0–100.0)
Monocytes Absolute: 0.5 10*3/uL (ref 0.1–1.0)
Monocytes Relative: 4 %
Myelocytes: 3 %
Neutro Abs: 10.8 10*3/uL — ABNORMAL HIGH (ref 1.7–7.7)
Neutrophils Relative %: 87 %
Platelets: 208 10*3/uL (ref 150–400)
RBC: 3.98 MIL/uL — ABNORMAL LOW (ref 4.22–5.81)
RDW: 16.1 % — ABNORMAL HIGH (ref 11.5–15.5)
WBC: 12 10*3/uL — ABNORMAL HIGH (ref 4.0–10.5)
nRBC: 0.6 % — ABNORMAL HIGH (ref 0.0–0.2)
nRBC: 2 /100 WBC — ABNORMAL HIGH

## 2020-08-11 LAB — BASIC METABOLIC PANEL
Anion gap: 10 (ref 5–15)
BUN: 40 mg/dL — ABNORMAL HIGH (ref 6–20)
CO2: 15 mmol/L — ABNORMAL LOW (ref 22–32)
Calcium: 8.3 mg/dL — ABNORMAL LOW (ref 8.9–10.3)
Chloride: 98 mmol/L (ref 98–111)
Creatinine, Ser: 2.92 mg/dL — ABNORMAL HIGH (ref 0.61–1.24)
GFR, Estimated: 25 mL/min — ABNORMAL LOW (ref >60)
Glucose, Bld: 165 mg/dL — ABNORMAL HIGH (ref 70–99)
Potassium: 4.1 mmol/L (ref 3.5–5.1)
Sodium: 123 mmol/L — ABNORMAL LOW (ref 135–145)

## 2020-08-11 LAB — COOXEMETRY PANEL
Carboxyhemoglobin: 1.3 % (ref 0.5–1.5)
Methemoglobin: 0.8 % (ref 0.0–1.5)
O2 Saturation: 72.7 %
Total hemoglobin: 13.4 g/dL (ref 12.0–16.0)

## 2020-08-11 LAB — SODIUM: Sodium: 124 mmol/L — ABNORMAL LOW (ref 135–145)

## 2020-08-11 MED ORDER — POTASSIUM CHLORIDE CRYS ER 20 MEQ PO TBCR
20.0000 meq | EXTENDED_RELEASE_TABLET | Freq: Once | ORAL | Status: AC
  Administered 2020-08-11: 20 meq via ORAL
  Filled 2020-08-11: qty 1

## 2020-08-11 MED ORDER — TOLVAPTAN 15 MG PO TABS
15.0000 mg | ORAL_TABLET | Freq: Once | ORAL | Status: AC
  Administered 2020-08-11: 15 mg via ORAL
  Filled 2020-08-11: qty 1

## 2020-08-11 MED ORDER — FUROSEMIDE 10 MG/ML IJ SOLN
80.0000 mg | Freq: Two times a day (BID) | INTRAMUSCULAR | Status: DC
  Administered 2020-08-11 – 2020-08-12 (×2): 80 mg via INTRAVENOUS
  Filled 2020-08-11 (×2): qty 8

## 2020-08-11 NOTE — Progress Notes (Signed)
[facility] PROGRESS NOTE    Progress Note  Roberto [name]  GOT:[id number] DOB: [birth year] DOA: 07/21/2020 PCP: [name], MD     Brief Narrative:   Roberto [name] is an 55 y.o. male past medical history of chronic systolic heart failure with a 2D echo in December 2021 that showed an EF of 25% grade 3 diastolic dysfunction recently admitted and discharged on 07/21/2020 for systolic heart failure, he noticed some right lower extremity redness and some discharge from the wound in the ER was found to be with systolic blood pressure of 80 and concerns for right lower extremity cellulitis with imaging concerning for heart failure.  BNP of 1400 creatinine of 2.7 white blood cell count of 15    Assessment/Plan:   Acute on chronic combined systolic and diastolic CHF (congestive heart failure) (HCC) Advanced heart failure team on board Creatinine is trending up, sodium is going down. Continue to hold diuretics, patient metabolic panels pending this morning. Currently on milrinone, having frequent PVCs. Patient has been weaned to room air. The advanced heart failure team recommended to reconsult palliative care to discuss end-of-life. Extremely poor prognosis refuses to meet with palliative care team. Patient has unrealistic expectations Further management per the advanced heart failure team.  Acute kidney injury on chronic kidney disease stage IIIb: With a baseline creatinine around 2, creatinine continues to rise despite medical therapy.  His creatinine continues to rise now is 3, with a potassium of 5 and becoming hyponatremic.  Sepsis due to right lower ext cellulitis: No osteomyelitis seen on imaging Doppler negative for DVT. Leg continues to be erythematous warm to touch, Has remained afebrile off antibiotics transition to Zyvox leukocytosis is improved.  Electrolyte imbalance: Recent metabolic panels pending.  History of VT/IVCD placement: Continue amiodarone drip  monitor electrolytes closely.  History of a flutter status post ablation:  continue amiodarone and Eliquis.  History of LV thrombus, Continue Eliquis.     DVT prophylaxis: eliquis Family Communication:none Status is: Inpatient  Remains inpatient appropriate because:Hemodynamically unstable  Dispo: The patient is from: Home              Anticipated d/c is to: Home              Patient currently is not medically stable to d/c.   Difficult to place patient No   Code Status:     Code Status Orders  (From admission, onward)           Start     Ordered   08/04/20 0602  Full code  Continuous        08/04/20 0602           Code Status History     Date Active Date Inactive Code Status Order ID Comments User Context   07/19/2020 0446 07/21/2020 2000 Full Code [id number]  Hillary Bow, DO ED   06/01/2020 1148 06/10/2020 2117 Full Code [id number]  Graciella Freer, PA-C ED   04/13/2020 1429 04/19/2020 1944 Full Code [id number]  Clydie Braun, MD Inpatient   12/20/2018 2342 01/24/2019 2317 Full Code [id number]  Rometta Emery, MD Inpatient   09/03/2018 1222 09/04/2018 1736 Full Code [id number]  Regan Lemming, MD Inpatient   03/21/2017 0419 03/26/2017 2255 Full Code [id number]  Ernest Mallick, MD ED   03/21/2017 0417 03/21/2017 0419 Full Code [id number]  Ernest Mallick, MD ED   11/17/2016 1443 11/17/2016 2257 Full Code [id number]  Regan Lemming, MD Inpatient  10/14/2016 1908 10/21/2016 1919 Full Code [id number]  Little Ishikawa, NP Inpatient         IV Access:   Peripheral IV   Procedures and diagnostic studies:   No results found.   Medical Consultants:   None.   Subjective:    Roberto [name] no complaints  Objective:    Vitals:   08/10/20 2018 08/10/20 2259 08/11/20 0409 08/11/20 0801  BP: 102/76 107/73 115/61 (!) 94/58  Pulse: 91 95 85 88  Resp: 20 18 20 19   Temp: 98.4 F (36.9 C) 99 F (37.2 C) 98.1 F (36.7 C) 98.1 F (36.7 C)   TempSrc: Oral Oral Oral Oral  SpO2: 95% 90% 90% 93%  Weight:   (!) 150.6 kg   Height:       SpO2: 93 % O2 Flow Rate (L/min): 2 L/min   Intake/Output Summary (Last 24 hours) at 08/11/2020 0851 Last data filed at 08/11/2020 0802 Gross per 24 hour  Intake 1045.94 ml  Output 500 ml  Net 545.94 ml    Filed Weights   08/09/20 0440 08/10/20 0330 08/11/20 0409  Weight: (!) 146.2 kg (!) 148.4 kg (!) 150.6 kg    Exam: General exam: In no acute distress. Respiratory system: Good air movement and clear to auscultation. Cardiovascular system: S1 & S2 heard, RRR. No JVD. Gastrointestinal system: Abdomen is nondistended, soft and nontender.  Psychiatry: Judgement and insight appear normal. Mood & affect appropriate. Skin: Right lower extremity erythema is receding, lower extremity still shiny swelling but not weeping not tender to touch.   Data Reviewed:    Labs: Basic Metabolic Panel: Recent Labs  Lab 08/06/20 0500 08/07/20 0550 08/07/20 1638 08/08/20 0228 08/09/20 0451 08/09/20 1315 08/10/20 0440  NA 137 135 131* 135 132* 132* 128*  K 3.6 3.6 3.4* 3.7 3.7 3.8 5.0  CL 103 102 100 99 101 96* 100  CO2 25 23 21* 23 21* 20* 19*  GLUCOSE 124* 130* 229* 185* 167* 261* 187*  BUN 29* 26* 27* 30* 35* 37* 37*  CREATININE 2.07* 1.95* 2.00* 2.27* 2.60* 2.78* 2.94*  CALCIUM 8.3* 8.1* 8.0* 8.7* 8.3* 8.5* 8.5*  MG 2.0 2.0  --  2.2 1.9  --  2.2    GFR Estimated Creatinine Clearance: 41.2 mL/min (A) (by C-G formula based on SCr of 2.94 mg/dL (H)). Liver Function Tests: Recent Labs  Lab 08/04/20 1505 08/05/20 0430 08/06/20 0500 08/07/20 0550 08/08/20 0228  AST 26 21 20 20 26   ALT 57* 43 40 34 30  ALKPHOS 56 62 51 52 56  BILITOT 1.2 1.0 1.1 1.3* 1.0  PROT 6.5 5.6* 6.4* 6.5 6.9  ALBUMIN 2.1* 1.7* 1.9* 1.9* 2.0*    No results for input(s): LIPASE, AMYLASE in the last 168 hours. No results for input(s): AMMONIA in the last 168 hours. Coagulation profile No results for  input(s): INR, PROTIME in the last 168 hours. COVID-19 Labs  No results for input(s): DDIMER, FERRITIN, LDH, CRP in the last 72 hours.  Lab Results  Component Value Date   SARSCOV2NAA NEGATIVE 08/04/2020   SARSCOV2NAA NEGATIVE 07/19/2020   SARSCOV2NAA NEGATIVE 06/01/2020   SARSCOV2NAA NEGATIVE 04/13/2020    CBC: Recent Labs  Lab 08/07/20 0550 08/08/20 0228 08/09/20 0451 08/10/20 0440 08/11/20 0520  WBC 14.7* 19.5* 18.3* 15.8* 12.0*  NEUTROABS 11.2* 15.0* 14.0* 11.2* 10.8*  HGB 14.5 14.8 14.7 13.7 11.9*  HCT 44.7 45.5 44.7 42.6 38.6*  MCV 93.7 93.4 92.9 93.0 97.0  PLT 226 251  255 246 208    Cardiac Enzymes: No results for input(s): CKTOTAL, CKMB, CKMBINDEX, TROPONINI in the last 168 hours. BNP (last 3 results) No results for input(s): PROBNP in the last 8760 hours. CBG: No results for input(s): GLUCAP in the last 168 hours. D-Dimer: No results for input(s): DDIMER in the last 72 hours. Hgb A1c: No results for input(s): HGBA1C in the last 72 hours. Lipid Profile: No results for input(s): CHOL, HDL, LDLCALC, TRIG, CHOLHDL, LDLDIRECT in the last 72 hours. Thyroid function studies: No results for input(s): TSH, T4TOTAL, T3FREE, THYROIDAB in the last 72 hours.  Invalid input(s): FREET3 Anemia work up: No results for input(s): VITAMINB12, FOLATE, FERRITIN, TIBC, IRON, RETICCTPCT in the last 72 hours. Sepsis Labs: Recent Labs  Lab 08/04/20 1505 08/05/20 0430 08/06/20 0500 08/07/20 0550 08/08/20 0228 08/09/20 0451 08/10/20 0440 08/11/20 0520  WBC 15.0*   < > 12.1*   < > 19.5* 18.3* 15.8* 12.0*  LATICACIDVEN 3.5*  --  1.5  --   --   --   --   --    < > = values in this interval not displayed.    Microbiology Recent Results (from the past 240 hour(s))  Resp Panel by RT-PCR (Flu A&B, Covid) Nasopharyngeal Swab     Status: None   Collection Time: 08/04/20  2:55 AM   Specimen: Nasopharyngeal Swab; Nasopharyngeal(NP) swabs in vial transport medium  Result Value  Ref Range Status   SARS Coronavirus 2 by RT PCR NEGATIVE NEGATIVE Final    Comment: (NOTE) SARS-CoV-2 target nucleic acids are NOT DETECTED.  The SARS-CoV-2 RNA is generally detectable in upper respiratory specimens during the acute phase of infection. The lowest concentration of SARS-CoV-2 viral copies this assay can detect is 138 copies/mL. A negative result does not preclude SARS-Cov-2 infection and should not be used as the sole basis for treatment or other patient management decisions. A negative result may occur with  improper specimen collection/handling, submission of specimen other than nasopharyngeal swab, presence of viral mutation(s) within the areas targeted by this assay, and inadequate number of viral copies(<138 copies/mL). A negative result must be combined with clinical observations, patient history, and epidemiological information. The expected result is Negative.  Fact Sheet for Patients:  BloggerCourse.com  Fact Sheet for Healthcare Providers:  SeriousBroker.it  This test is no t yet approved or cleared by the [place] FDA and  has been authorized for detection and/or diagnosis of SARS-CoV-2 by FDA under an Emergency Use Authorization (EUA). This EUA will remain  in effect (meaning this test can be used) for the duration of the COVID-19 declaration under Section 564(b)(1) of the Act, 21 U.S.C.section 360bbb-3(b)(1), unless the authorization is terminated  or revoked sooner.       Influenza A by PCR NEGATIVE NEGATIVE Final   Influenza B by PCR NEGATIVE NEGATIVE Final    Comment: (NOTE) The Xpert Xpress SARS-CoV-2/FLU/RSV plus assay is intended as an aid in the diagnosis of influenza from Nasopharyngeal swab specimens and should not be used as a sole basis for treatment. Nasal washings and aspirates are unacceptable for Xpert Xpress SARS-CoV-2/FLU/RSV testing.  Fact Sheet for  Patients: BloggerCourse.com  Fact Sheet for Healthcare Providers: SeriousBroker.it  This test is not yet approved or cleared by the [place] FDA and has been authorized for detection and/or diagnosis of SARS-CoV-2 by FDA under an Emergency Use Authorization (EUA). This EUA will remain in effect (meaning this test can be used) for the duration of the COVID-19  declaration under Section 564(b)(1) of the Act, 21 U.S.C. section 360bbb-3(b)(1), unless the authorization is terminated or revoked.  Performed at [facility], [address] [place], [place] [address]   Culture, blood (routine x 2)     Status: None   Collection Time: 08/04/20  6:05 AM   Specimen: BLOOD LEFT HAND  Result Value Ref Range Status   Specimen Description BLOOD LEFT HAND  Final   Special Requests   Final    BOTTLES DRAWN AEROBIC AND ANAEROBIC Blood Culture results may not be optimal due to an inadequate volume of blood received in culture bottles   Culture   Final    NO GROWTH 5 DAYS Performed at [facility], [address], [place], [place] [address]    Report Status 08/09/2020 FINAL  Final  Culture, blood (routine x 2)     Status: None   Collection Time: 08/04/20  6:10 AM   Specimen: BLOOD RIGHT HAND  Result Value Ref Range Status   Specimen Description BLOOD RIGHT HAND  Final   Special Requests   Final    BOTTLES DRAWN AEROBIC AND ANAEROBIC Blood Culture results may not be optimal due to an inadequate volume of blood received in culture bottles   Culture   Final    NO GROWTH 5 DAYS Performed at [facility], [address] [place], [place] [address]    Report Status 08/09/2020 FINAL  Final     Medications:    apixaban  5 mg Oral BID   Chlorhexidine Gluconate Cloth  6 each Topical Daily   guaiFENesin  600 mg Oral BID   linezolid  600 mg Oral Q12H   mexiletine  300 mg Oral Q12H   pantoprazole  40 mg Oral Daily   ranolazine   500 mg Oral BID   sodium chloride flush  10-40 mL Intracatheter Q12H   Continuous Infusions:  amiodarone 30 mg/hr (08/10/20 2213)   milrinone 0.25 mcg/kg/min (08/11/20 0843)      LOS: 7 days   Marinda Elk  [facility]  08/11/2020, 8:51 AM

## 2020-08-11 NOTE — Progress Notes (Signed)
Patient ID: ODEL SCHMID, male   DOB: May 14, 1965, 55 y.o.   MRN: [id number]     Advanced Heart Failure Rounding Note  PCP-Cardiologist: Glori Bickers, MD   Subjective:    Lasix held with rising creatinine, but today's CVP is up to about 16.  Weight trending up.   Creatinine 2.6>2.8>2.9>pending  On linezolid for RLE cellulitis.    Objective:   Weight Range: (!) 150.6 kg Body mass index is 50.5 kg/m.   Vital Signs:   Temp:  [98.1 F (36.7 C)-99 F (37.2 C)] 98.1 F (36.7 C) (07/30 0801) Pulse Rate:  [85-95] 88 (07/30 0801) Resp:  [16-20] 19 (07/30 0801) BP: (93-115)/(58-76) 94/58 (07/30 0801) SpO2:  [90 %-95 %] 93 % (07/30 0801) Weight:  [150.6 kg] 150.6 kg (07/30 0409) Last BM Date: 08/10/20  Weight change: Filed Weights   08/09/20 0440 08/10/20 0330 08/11/20 0409  Weight: (!) 146.2 kg (!) 148.4 kg (!) 150.6 kg    Intake/Output:   Intake/Output Summary (Last 24 hours) at 08/11/2020 1117 Last data filed at 08/11/2020 0802 Gross per 24 hour  Intake 1045.94 ml  Output 500 ml  Net 545.94 ml     CVP 16 personally checked  Physical Exam   General: NAD Neck: Thick, JVP 14-16 cm, no thyromegaly or thyroid nodule.  Lungs: Clear to auscultation bilaterally with normal respiratory effort. CV: Nonpalpable PMI.  Heart regular S1/S2, no S3/S4, no murmur.  2+ edema to knees.  Abdomen: Soft, nontender, no hepatosplenomegaly, no distention.  Skin: Intact without lesions or rashes.  Neurologic: Alert and oriented x 3.  Psych: Normal affect. Extremities: No clubbing or cyanosis.  HEENT: Normal.    Telemetry   NSR 80s (personally reviewed)  Labs    CBC Recent Labs    08/10/20 0440 08/11/20 0520  WBC 15.8* 12.0*  NEUTROABS 11.2* 10.8*  HGB 13.7 11.9*  HCT 42.6 38.6*  MCV 93.0 97.0  PLT 246 119   Basic Metabolic Panel Recent Labs    08/09/20 0451 08/09/20 1315 08/10/20 0440  NA 132* 132* 128*  K 3.7 3.8 5.0  CL 101 96* 100  CO2 21* 20* 19*   GLUCOSE 167* 261* 187*  BUN 35* 37* 37*  CREATININE 2.60* 2.78* 2.94*  CALCIUM 8.3* 8.5* 8.5*  MG 1.9  --  2.2   Liver Function Tests No results for input(s): AST, ALT, ALKPHOS, BILITOT, PROT, ALBUMIN in the last 72 hours.  No results for input(s): LIPASE, AMYLASE in the last 72 hours. Cardiac Enzymes No results for input(s): CKTOTAL, CKMB, CKMBINDEX, TROPONINI in the last 72 hours.  BNP: BNP (last 3 results) Recent Labs    07/19/20 0222 07/23/20 1117 08/04/2020 1707  BNP 959.9* 992.0* 1,426.8*    ProBNP (last 3 results) No results for input(s): PROBNP in the last 8760 hours.   D-Dimer No results for input(s): DDIMER in the last 72 hours. Hemoglobin A1C No results for input(s): HGBA1C in the last 72 hours. Fasting Lipid Panel No results for input(s): CHOL, HDL, LDLCALC, TRIG, CHOLHDL, LDLDIRECT in the last 72 hours. Thyroid Function Tests No results for input(s): TSH, T4TOTAL, T3FREE, THYROIDAB in the last 72 hours.  Invalid input(s): FREET3  Other results:   Imaging    No results found.   Medications:     Scheduled Medications:  apixaban  5 mg Oral BID   Chlorhexidine Gluconate Cloth  6 each Topical Daily   furosemide  80 mg Intravenous BID   guaiFENesin  600 mg Oral  BID   linezolid  600 mg Oral Q12H   mexiletine  300 mg Oral Q12H   pantoprazole  40 mg Oral Daily   ranolazine  500 mg Oral BID   sodium chloride flush  10-40 mL Intracatheter Q12H    Infusions:  amiodarone 30 mg/hr (08/11/20 1046)   milrinone 0.25 mcg/kg/min (08/11/20 0843)    PRN Medications: acetaminophen **OR** acetaminophen, benzonatate, guaiFENesin-dextromethorphan, ondansetron (ZOFRAN) IV, sodium chloride flush   Assessment/Plan   1. Acute on chronic combined CHF:  -NICM.  Echo 12/21 with EF 25-30%, moderate LV dilation. Has medtronic ICD.  Patient sent from office visit 07/17/2020 with cellulitis, as well as acute on chronic combined CHF. Lactate elevated to 3.6. Suspect  cellulitis with sepsis in setting of acute/chronic systolic CHF. Nearing end-stage. Diuretic stopped 7/28 due to worsening renal function. Creatinine 2.6>2.9, pending today.  CVP up to 16 now, weight rising.  - Restart Lasix 80 mg IV bid.  Need BMET done stat.  - Continue milrinone 0.25 for now, co-ox 73%.  - Hold carvedilol and spironolactone in the setting of decompensated CHF and AKI - Off digoxin and Farxiga with rising creatinine.   -Had discussion about GOC given recurrent HF exacerbations. Not a candidate for advanced therapies given size and ileostomy. He wishes to remain full code. Met with palliative care last admit, not interested in seeing them again at this point.  2. RLE cellulitis:  - Severe sepsis. Patient injured his leg PTA. No evidence of osteomyelitis or abscess on CT.  - Now on linezolid.  - Continue management per primary team  3. History of VT:  - Admission 05/2020 for VT with recurrence during admission, felt to be driving by electrolyte imbalance. Seen by EP and started on amiodarone and mexiletine with no evidence of recurrence on last interrogation 07/19/20. Patient denies ICD shocks - Very frequent PVCs and runs of NSVT on tele initially, now improved.  - Continue amio gtt at 30/hr - Continue mexiletine - Continue to monitor electrolytes closely and replete as needed to maintain K >4, Mg >2  4. History of LV thrombus:  - Continue Eliquis - No evidence of bleeding. Hgb stable.  5. Atrial flutter s/p ablation:  - In SR.  - Continue eliquis.  - Continue amiodarone for rhythm control  6. AKI on CKD stage 3b:  - Variable baseline creatinine 1.8-2.5.   - 2.7 on admit, down to 1.95. Now 2.0 > 2.27 > 2.6>>2.9>>pending  - Likely cardiorenal  7. Diverticulitis s/p ileostomy   Length of Stay: 7  Loralie Champagne, MD  08/11/2020, 11:17 AM  Advanced Heart Failure Team Pager [phone] (M-F; 7a - 5p)  Please contact [facility] for night-coverage after hours (5p -7a  ) and weekends on [email or web address]

## 2020-08-12 DIAGNOSIS — I5043 Acute on chronic combined systolic (congestive) and diastolic (congestive) heart failure: Secondary | ICD-10-CM | POA: Diagnosis not present

## 2020-08-12 DIAGNOSIS — N179 Acute kidney failure, unspecified: Secondary | ICD-10-CM | POA: Diagnosis not present

## 2020-08-12 LAB — LACTIC ACID, PLASMA: Lactic Acid, Venous: 1.5 mmol/L (ref 0.5–1.9)

## 2020-08-12 LAB — CBC WITH DIFFERENTIAL/PLATELET
Abs Immature Granulocytes: 0.86 10*3/uL — ABNORMAL HIGH (ref 0.00–0.07)
Basophils Absolute: 0.1 10*3/uL (ref 0.0–0.1)
Basophils Relative: 1 %
Eosinophils Absolute: 0.4 10*3/uL (ref 0.0–0.5)
Eosinophils Relative: 3 %
HCT: 40.1 % (ref 39.0–52.0)
Hemoglobin: 13.2 g/dL (ref 13.0–17.0)
Immature Granulocytes: 6 %
Lymphocytes Relative: 10 %
Lymphs Abs: 1.4 10*3/uL (ref 0.7–4.0)
MCH: 30.4 pg (ref 26.0–34.0)
MCHC: 32.9 g/dL (ref 30.0–36.0)
MCV: 92.4 fL (ref 80.0–100.0)
Monocytes Absolute: 1.3 10*3/uL — ABNORMAL HIGH (ref 0.1–1.0)
Monocytes Relative: 9 %
Neutro Abs: 10.5 10*3/uL — ABNORMAL HIGH (ref 1.7–7.7)
Neutrophils Relative %: 71 %
Platelets: 246 10*3/uL (ref 150–400)
RBC: 4.34 MIL/uL (ref 4.22–5.81)
RDW: 15.5 % (ref 11.5–15.5)
WBC: 14.5 10*3/uL — ABNORMAL HIGH (ref 4.0–10.5)
nRBC: 0.7 % — ABNORMAL HIGH (ref 0.0–0.2)

## 2020-08-12 LAB — BASIC METABOLIC PANEL
Anion gap: 11 (ref 5–15)
Anion gap: 11 (ref 5–15)
BUN: 40 mg/dL — ABNORMAL HIGH (ref 6–20)
BUN: 44 mg/dL — ABNORMAL HIGH (ref 6–20)
CO2: 18 mmol/L — ABNORMAL LOW (ref 22–32)
CO2: 18 mmol/L — ABNORMAL LOW (ref 22–32)
Calcium: 8.3 mg/dL — ABNORMAL LOW (ref 8.9–10.3)
Calcium: 8.3 mg/dL — ABNORMAL LOW (ref 8.9–10.3)
Chloride: 96 mmol/L — ABNORMAL LOW (ref 98–111)
Chloride: 97 mmol/L — ABNORMAL LOW (ref 98–111)
Creatinine, Ser: 3 mg/dL — ABNORMAL HIGH (ref 0.61–1.24)
Creatinine, Ser: 3.07 mg/dL — ABNORMAL HIGH (ref 0.61–1.24)
GFR, Estimated: 24 mL/min — ABNORMAL LOW (ref >60)
GFR, Estimated: 23 mL/min — ABNORMAL LOW (ref >60)
Glucose, Bld: 294 mg/dL — ABNORMAL HIGH (ref 70–99)
Glucose, Bld: 314 mg/dL — ABNORMAL HIGH (ref 70–99)
Potassium: 4 mmol/L (ref 3.5–5.1)
Potassium: 4.3 mmol/L (ref 3.5–5.1)
Sodium: 125 mmol/L — ABNORMAL LOW (ref 135–145)
Sodium: 126 mmol/L — ABNORMAL LOW (ref 135–145)

## 2020-08-12 LAB — COOXEMETRY PANEL
Carboxyhemoglobin: 1.1 % (ref 0.5–1.5)
Carboxyhemoglobin: 1.3 % (ref 0.5–1.5)
Methemoglobin: 0.6 % (ref 0.0–1.5)
Methemoglobin: 0.6 % (ref 0.0–1.5)
O2 Saturation: 56.1 %
O2 Saturation: 75.2 %
Total hemoglobin: 13.6 g/dL (ref 12.0–16.0)
Total hemoglobin: 11.1 g/dL — ABNORMAL LOW (ref 12.0–16.0)

## 2020-08-12 LAB — MAGNESIUM
Magnesium: 2.1 mg/dL (ref 1.7–2.4)
Magnesium: 2.1 mg/dL (ref 1.7–2.4)

## 2020-08-12 MED ORDER — ALTEPLASE 2 MG IJ SOLR
2.0000 mg | Freq: Once | INTRAMUSCULAR | Status: DC
  Filled 2020-08-12: qty 2

## 2020-08-12 MED ORDER — MILRINONE LACTATE IN DEXTROSE 20-5 MG/100ML-% IV SOLN
0.1250 ug/kg/min | INTRAVENOUS | Status: DC
  Administered 2020-08-13 – 2020-08-17 (×18): 0.375 ug/kg/min via INTRAVENOUS
  Administered 2020-08-18: 0.25 ug/kg/min via INTRAVENOUS
  Administered 2020-08-18: 0.375 ug/kg/min via INTRAVENOUS
  Administered 2020-08-18 (×2): 0.25 ug/kg/min via INTRAVENOUS
  Administered 2020-08-19: 0.125 ug/kg/min via INTRAVENOUS
  Administered 2020-08-19: 0.25 ug/kg/min via INTRAVENOUS
  Administered 2020-08-20: 0.125 ug/kg/min via INTRAVENOUS
  Filled 2020-08-12 (×26): qty 100

## 2020-08-12 MED ORDER — FUROSEMIDE 10 MG/ML IJ SOLN
120.0000 mg | Freq: Once | INTRAVENOUS | Status: AC
  Administered 2020-08-12: 120 mg via INTRAVENOUS
  Filled 2020-08-12: qty 12
  Filled 2020-08-12: qty 10

## 2020-08-12 MED ORDER — ALTEPLASE 2 MG IJ SOLR
2.0000 mg | Freq: Once | INTRAMUSCULAR | Status: AC
  Administered 2020-08-12: 2 mg

## 2020-08-12 MED ORDER — POTASSIUM CHLORIDE CRYS ER 20 MEQ PO TBCR
20.0000 meq | EXTENDED_RELEASE_TABLET | Freq: Once | ORAL | Status: AC
  Administered 2020-08-12: 20 meq via ORAL
  Filled 2020-08-12: qty 1

## 2020-08-12 MED ORDER — TOLVAPTAN 15 MG PO TABS
30.0000 mg | ORAL_TABLET | Freq: Once | ORAL | Status: AC
  Administered 2020-08-12: 30 mg via ORAL
  Filled 2020-08-12: qty 2

## 2020-08-12 NOTE — Progress Notes (Addendum)
Patient ID: Roberto [name], male   DOB: [birth year], 55 y.o.   MRN: [id number]     Advanced Heart Failure Rounding Note  PCP-Cardiologist: Glori Bickers, MD   Subjective:    Not much UOP recorded but CVP better today at 12.  Creatinine up to 3.  Na 125 (got tolvaptan yesterday). SBP stable 90s-100s. Co-ox 56%.   Creatinine 2.6>2.8>2.9>3  On linezolid for RLE cellulitis.    Objective:   Weight Range: (!) 151.4 kg Body mass index is 50.75 kg/m.   Vital Signs:   Temp:  [97.7 F (36.5 C)-98.4 F (36.9 C)] 98.4 F (36.9 C) (07/31 0757) Pulse Rate:  [82-100] 86 (07/31 0757) Resp:  [19-25] 19 (07/31 0757) BP: (85-110)/(54-79) 104/72 (07/31 0757) SpO2:  [92 %-96 %] 92 % (07/31 0757) Weight:  [151.4 kg] 151.4 kg (07/31 0320) Last BM Date: 08/11/20  Weight change: Filed Weights   08/10/20 0330 08/11/20 0409 08/12/20 0320  Weight: (!) 148.4 kg (!) 150.6 kg (!) 151.4 kg    Intake/Output:   Intake/Output Summary (Last 24 hours) at 08/12/2020 1045 Last data filed at 08/12/2020 0200 Gross per 24 hour  Intake 660.98 ml  Output 1300 ml  Net -639.02 ml     CVP 12 personally checked  Physical Exam   General: NAD Neck: JVP 12 cm, no thyromegaly or thyroid nodule.  Lungs: Clear to auscultation bilaterally with normal respiratory effort. CV: Nondisplaced PMI.  Heart regular S1/S2, no S3/S4, no murmur.  2+ edema to knees Abdomen: Soft, nontender, no hepatosplenomegaly, no distention.  Skin: Intact without lesions or rashes.  Neurologic: Alert and oriented x 3.  Psych: Normal affect. Extremities: No clubbing or cyanosis.  HEENT: Normal.    Telemetry   NSR 80s (personally reviewed)  Labs    CBC Recent Labs    08/11/20 0520 08/12/20 0512  WBC 12.0* 14.5*  NEUTROABS 10.8* 10.5*  HGB 11.9* 13.2  HCT 38.6* 40.1  MCV 97.0 92.4  PLT 208 790   Basic Metabolic Panel Recent Labs    08/10/20 0440 08/11/20 1142 08/11/20 2124 08/12/20 0512  NA 128* 123* 124* 125*  K  5.0 4.1  --  4.0  CL 100 98  --  96*  CO2 19* 15*  --  18*  GLUCOSE 187* 165*  --  294*  BUN 37* 40*  --  40*  CREATININE 2.94* 2.92*  --  3.00*  CALCIUM 8.5* 8.3*  --  8.3*  MG 2.2  --   --  2.1   Liver Function Tests No results for input(s): AST, ALT, ALKPHOS, BILITOT, PROT, ALBUMIN in the last 72 hours.  No results for input(s): LIPASE, AMYLASE in the last 72 hours. Cardiac Enzymes No results for input(s): CKTOTAL, CKMB, CKMBINDEX, TROPONINI in the last 72 hours.  BNP: BNP (last 3 results) Recent Labs    07/19/20 0222 07/23/20 1117 07/13/2020 1707  BNP 959.9* 992.0* 1,426.8*    ProBNP (last 3 results) No results for input(s): PROBNP in the last 8760 hours.   D-Dimer No results for input(s): DDIMER in the last 72 hours. Hemoglobin A1C No results for input(s): HGBA1C in the last 72 hours. Fasting Lipid Panel No results for input(s): CHOL, HDL, LDLCALC, TRIG, CHOLHDL, LDLDIRECT in the last 72 hours. Thyroid Function Tests No results for input(s): TSH, T4TOTAL, T3FREE, THYROIDAB in the last 72 hours.  Invalid input(s): FREET3  Other results:   Imaging    No results found.   Medications:  Scheduled Medications:  apixaban  5 mg Oral BID   Chlorhexidine Gluconate Cloth  6 each Topical Daily   furosemide  80 mg Intravenous BID   guaiFENesin  600 mg Oral BID   linezolid  600 mg Oral Q12H   mexiletine  300 mg Oral Q12H   pantoprazole  40 mg Oral Daily   ranolazine  500 mg Oral BID   sodium chloride flush  10-40 mL Intracatheter Q12H    Infusions:  amiodarone 30 mg/hr (08/11/20 2310)   milrinone 0.25 mcg/kg/min (08/12/20 0943)    PRN Medications: acetaminophen **OR** acetaminophen, benzonatate, guaiFENesin-dextromethorphan, ondansetron (ZOFRAN) IV, sodium chloride flush   Assessment/Plan   1. Acute on chronic combined CHF:  -NICM.  Echo 12/21 with EF 25-30%, moderate LV dilation. Has medtronic ICD.  Patient sent from office visit 08/06/2020 with  cellulitis, as well as acute on chronic combined CHF. Lactate elevated to 3.6. Suspect cellulitis with sepsis in setting of acute/chronic systolic CHF. Nearing end-stage. Diuretic stopped 7/28 due to worsening renal function, restarted 7/30 with rising CVP. Creatinine 2.6>2.9>3.  Not much UOP recorded but CVP lower at 12.  - Keep Lasix at 80 mg IV bid for now.  - Co-ox marginal at 56%, increase milrinone to 0.375 (minimal ectopy now and want to see if higher output will help settle renal function).   - Hold carvedilol and spironolactone in the setting of decompensated CHF and AKI - Off digoxin and Farxiga with rising creatinine.   - Had discussion about GOC given recurrent HF exacerbations. Poor prognosis and struggling with end-stage HF + cardiorenal syndrome.  Not a candidate for advanced therapies given size and ileostomy. He wishes to remain full code. Has met with palliative care, not interested in seeing them again at this point.  2. RLE cellulitis:  - Severe sepsis. Patient injured his leg PTA. No evidence of osteomyelitis or abscess on CT.  - Now on linezolid.  - Continue management per primary team  3. History of VT:  - Admission 05/2020 for VT with recurrence during admission, felt to be driving by electrolyte imbalance. Seen by EP and started on amiodarone and mexiletine with no evidence of recurrence on last interrogation 07/19/20. Patient denies ICD shocks - Very frequent PVCs and runs of NSVT on tele initially, now improved.  - Continue amio gtt at 30/hr - Continue mexiletine - Continue to monitor electrolytes closely and replete as needed to maintain K >4, Mg >2  4. History of LV thrombus:  - Continue Eliquis - No evidence of bleeding. Hgb stable.  5. Atrial flutter s/p ablation:  - In SR.  - Continue eliquis.  - Continue amiodarone for rhythm control  6. AKI on CKD stage 3b:  - Variable baseline creatinine 1.8-2.5.   - 2.7 on admit, down to 1.95. Now 2.0 > 2.27 >  2.6>>2.9>>3 - Likely cardiorenal  7. Diverticulitis s/p ileostomy  8. Hyponatremia: Hypervolemic hyponatremia.  - Tolvaptan 30 mg x 1 today.    Length of Stay: 8  Loralie Champagne, MD  08/12/2020, 10:45 AM  Advanced Heart Failure Team Pager [phone] (M-F; 7a - 5p)  Please contact [facility] for night-coverage after hours (5p -7a ) and weekends on [email or web address]

## 2020-08-12 NOTE — Progress Notes (Signed)
Pt's BP  90/55, 85/54, 79/52, latest being 92/57. On call MD DR. Toniann Fail notified. Per MD as long as MAP is over 65 continue to monitor. Notified MD pt is not symptomatic. Will continue to monitor.

## 2020-08-12 NOTE — Progress Notes (Signed)
Paged regarding asx hypotension (80/50s) with worsening fatigue and SOB. Patient with 875 cc UOP today, I (720), n (-400). Similar to yesterday. Wt increasing over the past 48h. Roberto [name] reports mild increase in SOB and general fatigue throughout the day although not significantly worse from this morning. Will increase milrinone to 0.375 mcg (from 0.25 mcg), Dr. McLean had recommended this earlier as well. Will increase lasix dose to 120 mg IV now (from 80 mg IV bid). Check co-ox/lactate/Mg/BMP off RUE PICC. Not candidate for AHF therapies given size/ileostomy. FULL code, palliative care has met with him in the past but not interested in additional discussion per chart review. Will f/u on labs and UOP.  

## 2020-08-12 NOTE — Plan of Care (Signed)

## 2020-08-12 NOTE — Progress Notes (Signed)
[facility] PROGRESS NOTE    Progress Note  TYQUEZ HOLLIBAUGH  QTM:[id number] DOB: July 15, 1965 DOA: 07/15/2020 PCP: [name], MD     Brief Narrative:   Roberto [name] is an 55 y.o. male past medical history of chronic systolic heart failure with a 2D echo in December 2021 that showed an EF of 25% grade 3 diastolic dysfunction recently admitted and discharged on 07/21/2020 for systolic heart failure, he noticed some right lower extremity redness and some discharge from the wound in the ER was found to be with systolic blood pressure of 80 and concerns for right lower extremity cellulitis with imaging concerning for heart failure.  BNP of 1400 creatinine of 2.7 white blood cell count of 15    Assessment/Plan:   Acute on chronic combined systolic and diastolic CHF (congestive heart failure) (HCC) Advanced heart failure team on board Creatinine continues to trend of his sodium is still significantly low. Diuretics were resumed, I's and O's he is at about 1200 cc urine output. Weight is not improving CVP of 12-14. For further management to the advanced heart failure team,, patient has adamantly refused to meet with palliative care for end-of-life discussion. I am concerned that the outcome will be poor as he has multiple organ dysfunction with deterioration and is not responding to treatment. The patient has unrealistic expectation, we will try to readdress goals of care meeting with palliative care  Acute kidney injury on chronic kidney disease stage IIIb: With a baseline creatinine around 2, despite aggressive medical therapy his creatinine today is 3. Likely due to cardiorenal syndrome will defer to the advanced heart failure team.  Sepsis due to right lower ext cellulitis: No osteomyelitis seen on imaging Doppler negative for DVT. Continue empiric antibiotics for total of 10 days. Legs look better.  Electrolyte imbalance: Recent metabolic panels pending.  History of  VT/IVCD placement: Continue amiodarone drip monitor electrolytes closely.  History of a flutter status post ablation:  continue amiodarone and Eliquis.  History of LV thrombus, Continue Eliquis.     DVT prophylaxis: eliquis Family Communication:none Status is: Inpatient  Remains inpatient appropriate because:Hemodynamically unstable  Dispo: The patient is from: Home              Anticipated d/c is to: Home              Patient currently is not medically stable to d/c.   Difficult to place patient No   Code Status:     Code Status Orders  (From admission, onward)           Start     Ordered   08/04/20 0602  Full code  Continuous        08/04/20 0602           Code Status History     Date Active Date Inactive Code Status Order ID Comments User Context   07/19/2020 0446 07/21/2020 2000 Full Code [id number]  Hillary Bow, DO ED   06/01/2020 1148 06/10/2020 2117 Full Code [id number]  Graciella Freer, PA-C ED   04/13/2020 1429 04/19/2020 1944 Full Code [id number]  Clydie Braun, MD Inpatient   12/20/2018 2342 01/24/2019 2317 Full Code [id number]  Rometta Emery, MD Inpatient   09/03/2018 1222 09/04/2018 1736 Full Code [id number]  Regan Lemming, MD Inpatient   03/21/2017 0419 03/26/2017 2255 Full Code [id number]  Ernest Mallick, MD ED   03/21/2017 0417 03/21/2017 0419 Full Code [id number]  Ernest Mallick, MD ED  11/17/2016 1443 11/17/2016 2257 Full Code [id number]  Regan Lemming, MD Inpatient   10/14/2016 1908 10/21/2016 1919 Full Code [id number]  Little Ishikawa, NP Inpatient         IV Access:   Peripheral IV   Procedures and diagnostic studies:   No results found.   Medical Consultants:   None.   Subjective:    KOREN SERMERSHEIM no complaints  Objective:    Vitals:   08/12/20 0139 08/12/20 0320 08/12/20 0400 08/12/20 0757  BP: (!) 92/57 100/61 110/67 104/72  Pulse: 82 85 89 86  Resp: (!) 23 (!) 23 (!) 21 19  Temp: 98 F (36.7 C) 97.8  F (36.6 C)  98.4 F (36.9 C)  TempSrc: Oral Oral  Oral  SpO2:  96%  92%  Weight:  (!) 151.4 kg    Height:       SpO2: 92 % O2 Flow Rate (L/min): 2 L/min   Intake/Output Summary (Last 24 hours) at 08/12/2020 0854 Last data filed at 08/12/2020 0200 Gross per 24 hour  Intake 660.98 ml  Output 1300 ml  Net -639.02 ml    Filed Weights   08/10/20 0330 08/11/20 0409 08/12/20 0320  Weight: (!) 148.4 kg (!) 150.6 kg (!) 151.4 kg    Exam: General exam: In no acute distress. Respiratory system: Good air movement and clear to auscultation. Cardiovascular system: S1 & S2 heard, RRR. No JVD. Gastrointestinal system: Abdomen is nondistended, soft and nontender.  Psychiatry: Judgement and insight appear normal. Mood & affect appropriate. Skin: Right lower extremity erythema continues to receded, his lower extremity is still shiny and swollen but not tender to touch.   Data Reviewed:    Labs: Basic Metabolic Panel: Recent Labs  Lab 08/07/20 0550 08/07/20 1638 08/08/20 0228 08/09/20 0451 08/09/20 1315 08/10/20 0440 08/11/20 1142 08/11/20 2124 08/12/20 0512  NA 135   < > 135 132* 132* 128* 123* 124* 125*  K 3.6   < > 3.7 3.7 3.8 5.0 4.1  --  4.0  CL 102   < > 99 101 96* 100 98  --  96*  CO2 23   < > 23 21* 20* 19* 15*  --  18*  GLUCOSE 130*   < > 185* 167* 261* 187* 165*  --  294*  BUN 26*   < > 30* 35* 37* 37* 40*  --  40*  CREATININE 1.95*   < > 2.27* 2.60* 2.78* 2.94* 2.92*  --  3.00*  CALCIUM 8.1*   < > 8.7* 8.3* 8.5* 8.5* 8.3*  --  8.3*  MG 2.0  --  2.2 1.9  --  2.2  --   --  2.1   < > = values in this interval not displayed.    GFR Estimated Creatinine Clearance: 40.5 mL/min (A) (by C-G formula based on SCr of 3 mg/dL (H)). Liver Function Tests: Recent Labs  Lab 08/06/20 0500 08/07/20 0550 08/08/20 0228  AST 20 20 26   ALT 40 34 30  ALKPHOS 51 52 56  BILITOT 1.1 1.3* 1.0  PROT 6.4* 6.5 6.9  ALBUMIN 1.9* 1.9* 2.0*    No results for input(s): LIPASE,  AMYLASE in the last 168 hours. No results for input(s): AMMONIA in the last 168 hours. Coagulation profile No results for input(s): INR, PROTIME in the last 168 hours. COVID-19 Labs  No results for input(s): DDIMER, FERRITIN, LDH, CRP in the last 72 hours.  Lab Results  Component Value Date  SARSCOV2NAA NEGATIVE 08/04/2020   SARSCOV2NAA NEGATIVE 07/19/2020   SARSCOV2NAA NEGATIVE 06/01/2020   SARSCOV2NAA NEGATIVE 04/13/2020    CBC: Recent Labs  Lab 08/08/20 0228 08/09/20 0451 08/10/20 0440 08/11/20 0520 08/12/20 0512  WBC 19.5* 18.3* 15.8* 12.0* 14.5*  NEUTROABS 15.0* 14.0* 11.2* 10.8* 10.5*  HGB 14.8 14.7 13.7 11.9* 13.2  HCT 45.5 44.7 42.6 38.6* 40.1  MCV 93.4 92.9 93.0 97.0 92.4  PLT 251 255 246 208 246    Cardiac Enzymes: No results for input(s): CKTOTAL, CKMB, CKMBINDEX, TROPONINI in the last 168 hours. BNP (last 3 results) No results for input(s): PROBNP in the last 8760 hours. CBG: No results for input(s): GLUCAP in the last 168 hours. D-Dimer: No results for input(s): DDIMER in the last 72 hours. Hgb A1c: No results for input(s): HGBA1C in the last 72 hours. Lipid Profile: No results for input(s): CHOL, HDL, LDLCALC, TRIG, CHOLHDL, LDLDIRECT in the last 72 hours. Thyroid function studies: No results for input(s): TSH, T4TOTAL, T3FREE, THYROIDAB in the last 72 hours.  Invalid input(s): FREET3 Anemia work up: No results for input(s): VITAMINB12, FOLATE, FERRITIN, TIBC, IRON, RETICCTPCT in the last 72 hours. Sepsis Labs: Recent Labs  Lab 08/06/20 0500 08/07/20 0550 08/09/20 0451 08/10/20 0440 08/11/20 0520 08/12/20 0512  WBC 12.1*   < > 18.3* 15.8* 12.0* 14.5*  LATICACIDVEN 1.5  --   --   --   --   --    < > = values in this interval not displayed.    Microbiology Recent Results (from the past 240 hour(s))  Resp Panel by RT-PCR (Flu A&B, Covid) Nasopharyngeal Swab     Status: None   Collection Time: 08/04/20  2:55 AM   Specimen: Nasopharyngeal  Swab; Nasopharyngeal(NP) swabs in vial transport medium  Result Value Ref Range Status   SARS Coronavirus 2 by RT PCR NEGATIVE NEGATIVE Final    Comment: (NOTE) SARS-CoV-2 target nucleic acids are NOT DETECTED.  The SARS-CoV-2 RNA is generally detectable in upper respiratory specimens during the acute phase of infection. The lowest concentration of SARS-CoV-2 viral copies this assay can detect is 138 copies/mL. A negative result does not preclude SARS-Cov-2 infection and should not be used as the sole basis for treatment or other patient management decisions. A negative result may occur with  improper specimen collection/handling, submission of specimen other than nasopharyngeal swab, presence of viral mutation(s) within the areas targeted by this assay, and inadequate number of viral copies(<138 copies/mL). A negative result must be combined with clinical observations, patient history, and epidemiological information. The expected result is Negative.  Fact Sheet for Patients:  BloggerCourse.com  Fact Sheet for Healthcare Providers:  SeriousBroker.it  This test is no t yet approved or cleared by the [place] FDA and  has been authorized for detection and/or diagnosis of SARS-CoV-2 by FDA under an Emergency Use Authorization (EUA). This EUA will remain  in effect (meaning this test can be used) for the duration of the COVID-19 declaration under Section 564(b)(1) of the Act, 21 U.S.C.section 360bbb-3(b)(1), unless the authorization is terminated  or revoked sooner.       Influenza A by PCR NEGATIVE NEGATIVE Final   Influenza B by PCR NEGATIVE NEGATIVE Final    Comment: (NOTE) The Xpert Xpress SARS-CoV-2/FLU/RSV plus assay is intended as an aid in the diagnosis of influenza from Nasopharyngeal swab specimens and should not be used as a sole basis for treatment. Nasal washings and aspirates are unacceptable for Xpert Xpress  SARS-CoV-2/FLU/RSV testing.  Fact Sheet for Patients: BloggerCourse.com  Fact Sheet for Healthcare Providers: SeriousBroker.it  This test is not yet approved or cleared by the [place] FDA and has been authorized for detection and/or diagnosis of SARS-CoV-2 by FDA under an Emergency Use Authorization (EUA). This EUA will remain in effect (meaning this test can be used) for the duration of the COVID-19 declaration under Section 564(b)(1) of the Act, 21 U.S.C. section 360bbb-3(b)(1), unless the authorization is terminated or revoked.  Performed at [facility], [address] [place], [place] [address]   Culture, blood (routine x 2)     Status: None   Collection Time: 08/04/20  6:05 AM   Specimen: BLOOD LEFT HAND  Result Value Ref Range Status   Specimen Description BLOOD LEFT HAND  Final   Special Requests   Final    BOTTLES DRAWN AEROBIC AND ANAEROBIC Blood Culture results may not be optimal due to an inadequate volume of blood received in culture bottles   Culture   Final    NO GROWTH 5 DAYS Performed at [facility], [address], [place], [place] [address]    Report Status 08/09/2020 FINAL  Final  Culture, blood (routine x 2)     Status: None   Collection Time: 08/04/20  6:10 AM   Specimen: BLOOD RIGHT HAND  Result Value Ref Range Status   Specimen Description BLOOD RIGHT HAND  Final   Special Requests   Final    BOTTLES DRAWN AEROBIC AND ANAEROBIC Blood Culture results may not be optimal due to an inadequate volume of blood received in culture bottles   Culture   Final    NO GROWTH 5 DAYS Performed at [facility], [address] [place], [place] [address]    Report Status 08/09/2020 FINAL  Final     Medications:    apixaban  5 mg Oral BID   Chlorhexidine Gluconate Cloth  6 each Topical Daily   furosemide  80 mg Intravenous BID   guaiFENesin  600 mg Oral BID   linezolid  600 mg Oral  Q12H   mexiletine  300 mg Oral Q12H   pantoprazole  40 mg Oral Daily   ranolazine  500 mg Oral BID   sodium chloride flush  10-40 mL Intracatheter Q12H   Continuous Infusions:  amiodarone 30 mg/hr (08/11/20 2310)   milrinone 0.25 mcg/kg/min (08/12/20 0116)      LOS: 8 days   Marinda Elk  [facility]  08/12/2020, 8:54 AM

## 2020-08-13 DIAGNOSIS — N179 Acute kidney failure, unspecified: Secondary | ICD-10-CM | POA: Diagnosis not present

## 2020-08-13 DIAGNOSIS — I5043 Acute on chronic combined systolic (congestive) and diastolic (congestive) heart failure: Secondary | ICD-10-CM | POA: Diagnosis not present

## 2020-08-13 LAB — BASIC METABOLIC PANEL
Anion gap: 12 (ref 5–15)
BUN: 45 mg/dL — ABNORMAL HIGH (ref 6–20)
CO2: 18 mmol/L — ABNORMAL LOW (ref 22–32)
Calcium: 8.5 mg/dL — ABNORMAL LOW (ref 8.9–10.3)
Chloride: 98 mmol/L (ref 98–111)
Creatinine, Ser: 3.06 mg/dL — ABNORMAL HIGH (ref 0.61–1.24)
GFR, Estimated: 23 mL/min — ABNORMAL LOW (ref >60)
Glucose, Bld: 195 mg/dL — ABNORMAL HIGH (ref 70–99)
Potassium: 3.5 mmol/L (ref 3.5–5.1)
Sodium: 128 mmol/L — ABNORMAL LOW (ref 135–145)

## 2020-08-13 LAB — CBC WITH DIFFERENTIAL/PLATELET
Abs Immature Granulocytes: 0.7 10*3/uL — ABNORMAL HIGH (ref 0.00–0.07)
Basophils Absolute: 0.4 10*3/uL — ABNORMAL HIGH (ref 0.0–0.1)
Basophils Relative: 3 %
Eosinophils Absolute: 0.3 10*3/uL (ref 0.0–0.5)
Eosinophils Relative: 2 %
HCT: 40 % (ref 39.0–52.0)
Hemoglobin: 13.2 g/dL (ref 13.0–17.0)
Lymphocytes Relative: 4 %
Lymphs Abs: 0.6 10*3/uL — ABNORMAL LOW (ref 0.7–4.0)
MCH: 29.8 pg (ref 26.0–34.0)
MCHC: 33 g/dL (ref 30.0–36.0)
MCV: 90.3 fL (ref 80.0–100.0)
Metamyelocytes Relative: 2 %
Monocytes Absolute: 0.9 10*3/uL (ref 0.1–1.0)
Monocytes Relative: 6 %
Myelocytes: 3 %
Neutro Abs: 11.8 10*3/uL — ABNORMAL HIGH (ref 1.7–7.7)
Neutrophils Relative %: 80 %
Platelets: 253 10*3/uL (ref 150–400)
RBC: 4.43 MIL/uL (ref 4.22–5.81)
RDW: 15.5 % (ref 11.5–15.5)
WBC: 14.7 10*3/uL — ABNORMAL HIGH (ref 4.0–10.5)
nRBC: 0.3 % — ABNORMAL HIGH (ref 0.0–0.2)
nRBC: 1 /100 WBC — ABNORMAL HIGH

## 2020-08-13 LAB — GLUCOSE, CAPILLARY
Glucose-Capillary: 201 mg/dL — ABNORMAL HIGH (ref 70–99)
Glucose-Capillary: 112 mg/dL — ABNORMAL HIGH (ref 70–99)
Glucose-Capillary: 163 mg/dL — ABNORMAL HIGH (ref 70–99)

## 2020-08-13 LAB — COOXEMETRY PANEL
Carboxyhemoglobin: 1.3 % (ref 0.5–1.5)
Methemoglobin: 0.9 % (ref 0.0–1.5)
O2 Saturation: 59.9 %
Total hemoglobin: 13.6 g/dL (ref 12.0–16.0)

## 2020-08-13 LAB — MAGNESIUM: Magnesium: 2.1 mg/dL (ref 1.7–2.4)

## 2020-08-13 LAB — HEMOGLOBIN A1C
Hgb A1c MFr Bld: 7.4 % — ABNORMAL HIGH (ref 4.8–5.6)
Mean Plasma Glucose: 165.68 mg/dL

## 2020-08-13 LAB — LACTIC ACID, PLASMA: Lactic Acid, Venous: 1.4 mmol/L (ref 0.5–1.9)

## 2020-08-13 MED ORDER — FUROSEMIDE 10 MG/ML IJ SOLN: 120.0000 mg | Freq: Two times a day (BID) | INTRAMUSCULAR | Status: DC

## 2020-08-13 MED ORDER — INSULIN ASPART 100 UNIT/ML IJ SOLN
2.0000 [IU] | Freq: Three times a day (TID) | INTRAMUSCULAR | Status: DC
Start: 2020-08-13 — End: 2020-08-16
  Administered 2020-08-13 – 2020-08-15 (×7): 2 [IU] via SUBCUTANEOUS

## 2020-08-13 MED ORDER — DEXTROSE 5 % IV SOLN
120.0000 mg | Freq: Two times a day (BID) | INTRAVENOUS | Status: DC
  Filled 2020-08-13 (×2): qty 12

## 2020-08-13 MED ORDER — INSULIN ASPART 100 UNIT/ML IJ SOLN
0.0000 [IU] | Freq: Three times a day (TID) | INTRAMUSCULAR | Status: DC
Start: 2020-08-13 — End: 2020-08-16
  Administered 2020-08-13: 3 [IU] via SUBCUTANEOUS
  Administered 2020-08-14: 2 [IU] via SUBCUTANEOUS
  Administered 2020-08-14: 1 [IU] via SUBCUTANEOUS
  Administered 2020-08-14 – 2020-08-15 (×3): 2 [IU] via SUBCUTANEOUS
  Administered 2020-08-15: 1 [IU] via SUBCUTANEOUS
  Administered 2020-08-16 (×2): 2 [IU] via SUBCUTANEOUS

## 2020-08-13 MED ORDER — MIDODRINE HCL 5 MG PO TABS
2.5000 mg | ORAL_TABLET | Freq: Three times a day (TID) | ORAL | Status: DC
  Administered 2020-08-13 – 2020-08-14 (×2): 2.5 mg via ORAL
  Filled 2020-08-13 (×2): qty 1

## 2020-08-13 MED ORDER — POTASSIUM CHLORIDE CRYS ER 20 MEQ PO TBCR
40.0000 meq | EXTENDED_RELEASE_TABLET | Freq: Once | ORAL | Status: AC
  Administered 2020-08-13: 40 meq via ORAL
  Filled 2020-08-13: qty 2

## 2020-08-13 MED ORDER — INSULIN ASPART 100 UNIT/ML IJ SOLN: 0.0000 [IU] | Freq: Every day | INTRAMUSCULAR | Status: DC

## 2020-08-13 MED ORDER — FUROSEMIDE 10 MG/ML IJ SOLN
120.0000 mg | Freq: Once | INTRAVENOUS | Status: AC
  Administered 2020-08-13: 120 mg via INTRAVENOUS
  Filled 2020-08-13: qty 10

## 2020-08-13 NOTE — Progress Notes (Signed)
Updated DR. Hall, BP 84/71, MAP 78. Continue current management and monitor per MD.

## 2020-08-13 NOTE — Progress Notes (Signed)
Pt had co-ox due, PICC line had barely any blood return. Both port flushed, luer Lock changed. Pt's position changed, unable to get enough blood return. Iv team was consulted, will continue to monitor.

## 2020-08-13 NOTE — Plan of Care (Signed)

## 2020-08-13 NOTE — Progress Notes (Signed)
Inpatient Diabetes Program Recommendations  AACE/ADA: New Consensus Statement on Inpatient Glycemic Control (2015)  Target Ranges:  Prepandial:   less than 140 mg/dL      Peak postprandial:   less than 180 mg/dL (1-2 hours)      Critically ill patients:  140 - 180 mg/dL   Lab Results  Component Value Date   GLUCAP 116 (H) 01/24/2019   HGBA1C 7.0 (A) 05/14/2020   HGBA1C 7.0 05/14/2020    Review of Glycemic Control Results for Roberto [name], Roberto [name] (MRN [id number]) as of 08/13/2020 10:36  Ref. Range 08/11/2020 11:42 08/12/2020 05:12 08/12/2020 17:50 08/13/2020 02:32  Glucose Latest Ref Range: 70 - 99 mg/dL 962 (H) 229 (H) 798 (H) 195 (H)   Diabetes history: DM 2 Outpatient Diabetes medications:  Farxiga 10 mg daily  Current orders for Inpatient glycemic control:  None Inpatient Diabetes Program Recommendations:   Please consider adding Novolog sensitive tid with meals and HS.   Thanks,  Beryl Meager, RN, BC-ADM Inpatient Diabetes Coordinator Pager [phone]  (8a-5p)

## 2020-08-13 NOTE — Progress Notes (Signed)
Dr. Cherly Beach called back, notified Bp came back up some, 84/71 with MAP of 78. Pt is not worse about the same, sleepy , sitting in recliner and refusing to get back in bed. MD stated continue with milrinone, and he will notify the team. Will continue to monitor.

## 2020-08-13 NOTE — Progress Notes (Signed)
Physical Therapy Treatment Patient Details Name: Roberto [name] MRN: [id number] DOB: October 15, 1965 Today's Date: 08/13/2020    History of Present Illness 55 y/o male admitted 7/22 with acute on chronic combined systolic and diastolic CHF and RLE cellulitis. The advanced heart failure team recommended to reconsult palliative care to discuss end-of-life. PMH: d/c 07/21/20 for acute on chronic systolic CHF with grade 3 diastolic dysfunction, diverticulitis, CAD, DVT, venous insufficiency, atypical atrial flutter, gout, CKD stage IIIb, nonischemic cardiomyopathy, and hepatitis.    PT Comments    Pt demonstrated decreased activity tolerance and ambulation distance, limited by fatigue and feeling SOB. PT increased O2 to 2L during ambulation to determine whether it would impact SOB (see vitals). Pt also demonstrated difficulty with incentive spirometry (250cc). Pt educated on use of incentive spirometer for exercise program. Downgrading goals, as pt has not progressed with ambulation and activity tolerance. Pre-vitals - 88, spO2 92 on RA, BP 93/65(73) Vitals during ambulation - RR 37, HR 92, spO2 95-98 on 2L O2     Follow Up Recommendations  Home health PT     Equipment Recommendations  None recommended by PT    Recommendations for Other Services       Precautions / Restrictions Restrictions Weight Bearing Restrictions: No    Mobility  Bed Mobility               General bed mobility comments: Pt in recliner upon arrival    Transfers Overall transfer level: Needs assistance Equipment used: Rolling walker (2 wheeled) Transfers: Sit to/from Stand Sit to Stand: Supervision            Ambulation/Gait Ambulation/Gait assistance: Supervision Gait Distance (Feet): 30 Feet Assistive device: Rolling walker (2 wheeled) Gait Pattern/deviations: Wide base of support;Step-through pattern     General Gait Details: Pt ambulated a total of 63ft with 2 seated rest breaks (41ft, 59ft,  48ft). Increased O2 to 2L, as pt mentioned feeling SOB (spO2 92-93). On 2L O2, spO2 ranged from 94-98. RR increased to 37-38 with ambulation and seated rest. Pt educated on use of incentive spirometer (to 250-500 today).   Stairs             Wheelchair Mobility    Modified Rankin (Stroke Patients Only)       Balance Overall balance assessment: No apparent balance deficits (not formally assessed)           Standing balance-Leahy Scale: Poor Standing balance comment: Requires use of RW and BUE support for standing balance                            Cognition Arousal/Alertness: Awake/alert Behavior During Therapy: WFL for tasks assessed/performed Overall Cognitive Status: Within Functional Limits for tasks assessed                                 General Comments: Responded to commands accurately      Exercises      General Comments General comments (skin integrity, edema, etc.): Increased RR      Pertinent Vitals/Pain Pain Assessment: No/denies pain    Home Living                      Prior Function            PT Goals (current goals can now be found in the care plan section) Acute Rehab  PT Goals Patient Stated Goal: To return home and work out again PT Goal Formulation: With patient Time For Goal Achievement: 08/19/2020 Potential to Achieve Goals: Fair Progress towards PT goals: Goals downgraded-see care plan;Not progressing toward goals - comment (Pt demonstrating decreased ambulation distance with feeling SOB and required RW and BUE for support. Performed incentive spirometry to 250)    Frequency    Min 3X/week      PT Plan Current plan remains appropriate    Co-evaluation              AM-PAC PT "6 Clicks" Mobility   Outcome Measure  Help needed turning from your back to your side while in a flat bed without using bedrails?: None Help needed moving from lying on your back to sitting on the side of a  flat bed without using bedrails?: None Help needed moving to and from a bed to a chair (including a wheelchair)?: None Help needed standing up from a chair using your arms (e.g., wheelchair or bedside chair)?: None Help needed to walk in hospital room?: A Little Help needed climbing 3-5 steps with a railing? : A Little 6 Click Score: 22    End of Session Equipment Utilized During Treatment: Gait belt;Oxygen Activity Tolerance: Patient limited by fatigue Patient left: with call bell/phone within reach;with family/visitor present;in chair Nurse Communication: Mobility status;Other (comment) (use of incentive spirometer) PT Visit Diagnosis: Other abnormalities of gait and mobility (R26.89)     Time: 3825-0539 PT Time Calculation (min) (ACUTE ONLY): 36 min  Charges:  $Gait Training: 8-22 mins $Therapeutic Activity: 8-22 mins                     Roberto [name], SPT Acute Rehab: [phone]    Roberto [name] 08/13/2020, 11:43 AM

## 2020-08-13 NOTE — Progress Notes (Signed)
Paged on call MD DR. Hall with pt's BP 85/44, MAP 57. MD called back, updated pt is in the recliner and doesn't want to get back in bed. Milrinone and Amio drip running. Pt's BP has been running low. MD stated to notify Cardiology. Will continue to monitor.

## 2020-08-13 NOTE — Progress Notes (Signed)
On call cardiology DR. Carlisle text paged with pt's BP 85/44, MAP 57. Amio and Milrinone gtts running. Awaiting call back.

## 2020-08-13 NOTE — Progress Notes (Addendum)
Patient ID: Roberto [name], male   DOB: [birth year], 55 y.o.   MRN: [id number]     Advanced Heart Failure Rounding Note  PCP-Cardiologist: Glori Bickers, MD   Subjective:   SBP 80s overnight and was more dyspneic. Given 120 mg lasix IV. Milrinone increased to 0.375 mcg/min  Co-ox 59% and lactic acid 1.4.   Is/Os not accurate. Reports multiple unmeasured outputs from colostomy bag. ? Weight down 5 lb over last 24 hrs. CVP 9.  Dyspneic with minimal activity, comfortable at rest.   Creatinine 3 last few days.    Na up to 128. Last dose of tolvaptan yesterday.    On linezolid for RLE cellulitis.    Objective:   Weight Range: (!) 149.1 kg Body mass index is 49.98 kg/m.   Vital Signs:   Temp:  [97.6 F (36.4 C)-98.4 F (36.9 C)] 98 F (36.7 C) (08/01 0645) Pulse Rate:  [84-89] 89 (08/01 0645) Resp:  [17-35] 24 (08/01 0400) BP: (81-104)/(44-80) 84/71 (08/01 0400) SpO2:  [79 %-96 %] 95 % (08/01 0645) Weight:  [149.1 kg] 149.1 kg (08/01 0700) Last BM Date: 08/13/20  Weight change: Filed Weights   08/11/20 0409 08/12/20 0320 08/13/20 0700  Weight: (!) 150.6 kg (!) 151.4 kg (!) 149.1 kg    Intake/Output:   Intake/Output Summary (Last 24 hours) at 08/13/2020 0702 Last data filed at 08/13/2020 0400 Gross per 24 hour  Intake 2198.15 ml  Output 2275 ml  Net -76.85 ml     CVP 9 personally checked  Physical Exam   General: NAD Neck: + JVD, no thyromegaly or thyroid nodule.  Lungs: Clear to auscultation bilaterally with normal respiratory effort. CV: Nondisplaced PMI.  Rhythm regular. S1/S2, no S3/S4, no murmur.  Abdomen: Soft, nontender, no hepatosplenomegaly, no distention.  Skin: Intact without lesions or rashes.  Neurologic: Alert and oriented x 3.  Psych: Normal affect. Extremities: No clubbing or cyanosis. 2+ edema.  HEENT: Normal.    Telemetry   NSR 70s, signifcantly less Pvcs (personally reviewed)  Labs    CBC Recent Labs    08/12/20 0512 08/13/20 0232   WBC 14.5* 14.7*  NEUTROABS 10.5* 11.8*  HGB 13.2 13.2  HCT 40.1 40.0  MCV 92.4 90.3  PLT 246 458   Basic Metabolic Panel Recent Labs    08/12/20 1750 08/13/20 0232  NA 126* 128*  K 4.3 3.5  CL 97* 98  CO2 18* 18*  GLUCOSE 314* 195*  BUN 44* 45*  CREATININE 3.07* 3.06*  CALCIUM 8.3* 8.5*  MG 2.1 2.1   Liver Function Tests No results for input(s): AST, ALT, ALKPHOS, BILITOT, PROT, ALBUMIN in the last 72 hours.  No results for input(s): LIPASE, AMYLASE in the last 72 hours. Cardiac Enzymes No results for input(s): CKTOTAL, CKMB, CKMBINDEX, TROPONINI in the last 72 hours.  BNP: BNP (last 3 results) Recent Labs    07/19/20 0222 07/23/20 1117 08/10/2020 1707  BNP 959.9* 992.0* 1,426.8*    ProBNP (last 3 results) No results for input(s): PROBNP in the last 8760 hours.   D-Dimer No results for input(s): DDIMER in the last 72 hours. Hemoglobin A1C No results for input(s): HGBA1C in the last 72 hours. Fasting Lipid Panel No results for input(s): CHOL, HDL, LDLCALC, TRIG, CHOLHDL, LDLDIRECT in the last 72 hours. Thyroid Function Tests No results for input(s): TSH, T4TOTAL, T3FREE, THYROIDAB in the last 72 hours.  Invalid input(s): FREET3  Other results:   Imaging    No results found.  Medications:     Scheduled Medications:  alteplase  2 mg Intracatheter Once   apixaban  5 mg Oral BID   Chlorhexidine Gluconate Cloth  6 each Topical Daily   guaiFENesin  600 mg Oral BID   linezolid  600 mg Oral Q12H   mexiletine  300 mg Oral Q12H   pantoprazole  40 mg Oral Daily   ranolazine  500 mg Oral BID   sodium chloride flush  10-40 mL Intracatheter Q12H    Infusions:  amiodarone 30 mg/hr (08/12/20 2350)   milrinone 0.375 mcg/kg/min (08/13/20 0106)    PRN Medications: acetaminophen **OR** acetaminophen, benzonatate, guaiFENesin-dextromethorphan, ondansetron (ZOFRAN) IV, sodium chloride flush   Assessment/Plan   1. Acute on chronic combined CHF:   -NICM.  Echo 12/21 with EF 25-30%, moderate LV dilation. Has medtronic ICD.  Patient sent from office visit 07/21/2020 with cellulitis, as well as acute on chronic combined CHF. Lactate elevated to 3.6. Suspect cellulitis with sepsis in setting of acute/chronic systolic CHF. Nearing end-stage. Diuretic stopped 7/28 due to worsening renal function, restarted 7/30 with rising CVP. Creatinine 2.6>2.9>3.   -Co-ox 59% on 0.375 milrinone, PVC burden improved -SBP 80s overnight, 100s this am. May need to add midodrine if recurrent hypotension. -Given 120 mg lasix IV last night (reports better output than with 80 mg). CVP 9 this am. Appears slightly volume up, although some of leg edema may be due to venous stasis. Unna boots.  -Lasix 120 mg IV X 1. Use caution with diuresis d/t hypotension. -Hold carvedilol and spironolactone in the setting of decompensated CHF and AKI -Off digoxin and Farxiga with rising creatinine.   -Had discussion about GOC again today given recurrent HF exacerbations. Poor prognosis and struggling with end-stage HF + cardiorenal syndrome.  Not a candidate for advanced therapies given size and ileostomy. He wishes to remain full code for now. He will discuss his wishes with his family members today, will revisit tomorrow. Has met with palliative care, not interested in seeing them again at this point.  2. RLE cellulitis:  - Severe sepsis. Patient injured his leg PTA. No evidence of osteomyelitis or abscess on CT.  - Treated with vancomycin and cefepime. Now on linezolid.  - Continue management per primary team  3. History of VT:  - Admission 05/2020 for VT with recurrence during admission, felt to be driving by electrolyte imbalance. Seen by EP and started on amiodarone and mexiletine with no evidence of recurrence on last interrogation 07/19/20. Patient denies ICD shocks - Very frequent PVCs and runs of NSVT on tele initially, now improved.  - Continue amio gtt at 30/hr - Continue  mexiletine - Continue to monitor electrolytes closely and replete as needed to maintain K >4, Mg >2  4. History of LV thrombus:  - Continue Eliquis - No evidence of bleeding. Hgb stable.  5. Atrial flutter s/p ablation:  - In SR.  - Continue eliquis.  - Continue amiodarone for rhythm control  6. AKI on CKD stage 3b:  - Variable baseline creatinine 1.8-2.5.   - 2.7 on admit, down to 1.95. Now 2.0 > 2.27 > 2.6>>2.9>>3>>3.06 - Likely cardiorenal  7. Diverticulitis s/p ileostomy  8. Hyponatremia: Hypervolemic hyponatremia.  -Na 128 after tolvaptan yesterday   Length of Stay: [place], Roberto N, PA-C  08/13/2020, 7:02 AM  Advanced Heart Failure Team Pager [phone] (M-F; 7a - 5p)  Please contact [facility] for night-coverage after hours (5p -7a ) and weekends on [email or web address]  Patient seen  and examined with the above-signed Advanced Practice Provider and/or Housestaff. I personally reviewed laboratory data, imaging studies and relevant notes. I independently examined the patient and formulated the important aspects of the plan. I have edited the note to reflect any of my changes or salient points. I have personally discussed the plan with the patient and/or family.  Remains tenuous with marginal co-ox on milrinone 0.375. BP soft. Lactic acid 1.4. Creatinine stabilized ~3.0. Remains in NSR on IV amio. Feels ok. Denies CP or SOB. CVP 6-7  General:  Obese male lying in bed No resp difficulty HEENT: normal Neck: supple. no JVD. Carotids 2+ bilat; no bruits. No lymphadenopathy or thryomegaly appreciated. Cor: PMI nondisplaced. Regular rate & rhythm. No rubs, gallops or murmurs. Lungs: clear Abdomen: obese soft, nontender, nondistended. No hepatosplenomegaly. No bruits or masses. Good bowel sounds. + ostomy Extremities: no cyanosis, clubbing, rash, 1+ edema RLE cellulitis resolving Neuro: alert & orientedx3, cranial nerves grossly intact. moves all 4 extremities w/o difficulty. Affect  pleasant  Remains tenuous but overall improved. Sepsis resolving. Remains inotrope dependent. Volume status better. Will continue milrinone for now. Wrap LEs. Add low dose midodrine for BP support. Stil with some mild LE edema but overall volume status not too bad. Weight down bellow baseline. Will give one dose of IV lasix today.   I discussed [place] with him and his son today. Remains Full Code.   Glori Bickers, MD  12:56 PM

## 2020-08-13 NOTE — Progress Notes (Signed)
Mobility Specialist: Progress Note   08/13/20 1456  Mobility  Activity Refused mobility   Pt refused mobility stating he is tired and hasn't had lunch yet. Assisted pt with ordering his lunch. Family members present in the room.   [facility] Heddy Vidana Mobility Specialist Mobility Specialist Phone: [phone]

## 2020-08-13 NOTE — Progress Notes (Signed)
[facility] PROGRESS NOTE    Progress Note  Roberto [name]  GMW:[id number] DOB: June 16, 1965 DOA: 07/30/2020 PCP: [name], MD     Brief Narrative:   Roberto [name] is an 55 y.o. male past medical history of chronic systolic heart failure with a 2D echo in December 2021 that showed an EF of 25% grade 3 diastolic dysfunction recently admitted and discharged on 07/21/2020 for systolic heart failure, he noticed some right lower extremity redness and some discharge from the wound in the ER was found to be with cardiogenic shock and concerns for right lower extremity cellulitis, with imaging concerning for heart failure.  He was also found to be septic due to right lower extremity cellulitis and he will complete a 14-day course has remained afebrile leukocytosis persistently elevated.  BNP of 1400 creatinine of 2.7 white blood cell count of 15.  The advanced heart failure team has been consulted he was started on IV milrinone and IV diuresis with poor response.  Creatinine continues to deteriorate, CO-ox persistently low, he continues to be negative with fair urine output, his weight is not improving and he is persistently hyponatremic. His weight is not improving CVP continues to be elevated.  Patient adamantly refuses to meet with  Palliative Care to discuss end-of-life has an extremely poor prognosis.    Assessment/Plan:   Acute on chronic combined systolic and diastolic CHF (congestive heart failure) (HCC) Advanced heart failure team on board. He still appears significantly overloaded, Coreg and diuretic therapy have been held off digoxin and Farxiga due to a rise in his creatinine Despite diuretic therapy and milrinone creatinine continues to be elevated. For further management to the advanced heart failure team. Patient has adamantly refused to meet with palliative care for end-of-life discussion.  Acute kidney injury on chronic kidney disease stage IIIb: With a baseline  creatinine around 2, despite aggressive medical therapy his creatinine today is 3. Likely due to cardiorenal syndrome will defer to the advanced heart failure team.  Sepsis due to right lower ext cellulitis: No osteomyelitis seen on imaging Doppler negative for DVT. Continue empiric antibiotics for total of 14 days.  Electrolyte imbalance: Recent metabolic panels pending.  History of VT/IVCD placement: Continue amiodarone drip monitor electrolytes closely.  History of a flutter status post ablation:  continue amiodarone and Eliquis.  History of LV thrombus: Continue Eliquis.     DVT prophylaxis: eliquis Family Communication:none Status is: Inpatient  Remains inpatient appropriate because:Hemodynamically unstable  Dispo: The patient is from: Home              Anticipated d/c is to: Home              Patient currently is not medically stable to d/c.   Difficult to place patient No   Code Status:     Code Status Orders  (From admission, onward)           Start     Ordered   08/04/20 0602  Full code  Continuous        08/04/20 0602           Code Status History     Date Active Date Inactive Code Status Order ID Comments User Context   07/19/2020 0446 07/21/2020 2000 Full Code [id number]  Hillary Bow, DO ED   06/01/2020 1148 06/10/2020 2117 Full Code [id number]  Graciella Freer, PA-C ED   04/13/2020 1429 04/19/2020 1944 Full Code [id number]  Clydie Braun, MD Inpatient  12/20/2018 2342 01/24/2019 2317 Full Code [id number]  Rometta Emery, MD Inpatient   09/03/2018 1222 09/04/2018 1736 Full Code [id number]  Regan Lemming, MD Inpatient   03/21/2017 0419 03/26/2017 2255 Full Code [id number]  Ernest Mallick, MD ED   03/21/2017 0417 03/21/2017 0419 Full Code [id number]  Ernest Mallick, MD ED   11/17/2016 1443 11/17/2016 2257 Full Code [id number]  Regan Lemming, MD Inpatient   10/14/2016 1908 10/21/2016 1919 Full Code [id number]  Little Ishikawa, NP Inpatient          IV Access:   Peripheral IV   Procedures and diagnostic studies:   No results found.   Medical Consultants:   None.   Subjective:    Roberto [name] no complaints  Objective:    Vitals:   08/13/20 0400 08/13/20 0645 08/13/20 0700 08/13/20 0738  BP: (!) 84/71   (!) 100/56  Pulse: 88 89  85  Resp: (!) 24   18  Temp:  98 F (36.7 C)  (!) 97.5 F (36.4 C)  TempSrc:  Oral  Oral  SpO2:  95%  91%  Weight:   (!) 149.1 kg   Height:       SpO2: 91 % O2 Flow Rate (L/min): 2 L/min   Intake/Output Summary (Last 24 hours) at 08/13/2020 0957 Last data filed at 08/13/2020 0400 Gross per 24 hour  Intake 1718.15 ml  Output 1425 ml  Net 293.15 ml    Filed Weights   08/11/20 0409 08/12/20 0320 08/13/20 0700  Weight: (!) 150.6 kg (!) 151.4 kg (!) 149.1 kg    Exam: General exam: In no acute distress. Respiratory system: Good air movement and clear to auscultation. Cardiovascular system: S1 & S2 heard, RRR. No JVD. Gastrointestinal system: Abdomen is nondistended, soft and nontender.  Extremities: No pedal edema. Skin: Right lower extremity erythema continues to receded, his lower extremity is still shiny and swollen but not tender to touch.   Data Reviewed:    Labs: Basic Metabolic Panel: Recent Labs  Lab 08/09/20 0451 08/09/20 1315 08/10/20 0440 08/11/20 1142 08/11/20 2124 08/12/20 0512 08/12/20 1750 08/13/20 0232  NA 132*   < > 128* 123* 124* 125* 126* 128*  K 3.7   < > 5.0 4.1  --  4.0 4.3 3.5  CL 101   < > 100 98  --  96* 97* 98  CO2 21*   < > 19* 15*  --  18* 18* 18*  GLUCOSE 167*   < > 187* 165*  --  294* 314* 195*  BUN 35*   < > 37* 40*  --  40* 44* 45*  CREATININE 2.60*   < > 2.94* 2.92*  --  3.00* 3.07* 3.06*  CALCIUM 8.3*   < > 8.5* 8.3*  --  8.3* 8.3* 8.5*  MG 1.9  --  2.2  --   --  2.1 2.1 2.1   < > = values in this interval not displayed.    GFR Estimated Creatinine Clearance: 39.3 mL/min (A) (by C-G formula based on SCr of  3.06 mg/dL (H)). Liver Function Tests: Recent Labs  Lab 08/07/20 0550 08/08/20 0228  AST 20 26  ALT 34 30  ALKPHOS 52 56  BILITOT 1.3* 1.0  PROT 6.5 6.9  ALBUMIN 1.9* 2.0*    No results for input(s): LIPASE, AMYLASE in the last 168 hours. No results for input(s): AMMONIA in the last 168 hours. Coagulation profile No results for input(s): INR, PROTIME in  the last 168 hours. COVID-19 Labs  No results for input(s): DDIMER, FERRITIN, LDH, CRP in the last 72 hours.  Lab Results  Component Value Date   SARSCOV2NAA NEGATIVE 08/04/2020   SARSCOV2NAA NEGATIVE 07/19/2020   SARSCOV2NAA NEGATIVE 06/01/2020   SARSCOV2NAA NEGATIVE 04/13/2020    CBC: Recent Labs  Lab 08/09/20 0451 08/10/20 0440 08/11/20 0520 08/12/20 0512 08/13/20 0232  WBC 18.3* 15.8* 12.0* 14.5* 14.7*  NEUTROABS 14.0* 11.2* 10.8* 10.5* 11.8*  HGB 14.7 13.7 11.9* 13.2 13.2  HCT 44.7 42.6 38.6* 40.1 40.0  MCV 92.9 93.0 97.0 92.4 90.3  PLT 255 246 208 246 253    Cardiac Enzymes: No results for input(s): CKTOTAL, CKMB, CKMBINDEX, TROPONINI in the last 168 hours. BNP (last 3 results) No results for input(s): PROBNP in the last 8760 hours. CBG: No results for input(s): GLUCAP in the last 168 hours. D-Dimer: No results for input(s): DDIMER in the last 72 hours. Hgb A1c: No results for input(s): HGBA1C in the last 72 hours. Lipid Profile: No results for input(s): CHOL, HDL, LDLCALC, TRIG, CHOLHDL, LDLDIRECT in the last 72 hours. Thyroid function studies: No results for input(s): TSH, T4TOTAL, T3FREE, THYROIDAB in the last 72 hours.  Invalid input(s): FREET3 Anemia work up: No results for input(s): VITAMINB12, FOLATE, FERRITIN, TIBC, IRON, RETICCTPCT in the last 72 hours. Sepsis Labs: Recent Labs  Lab 08/10/20 0440 08/11/20 0520 08/12/20 0512 08/12/20 1750 08/13/20 0232  WBC 15.8* 12.0* 14.5*  --  14.7*  LATICACIDVEN  --   --   --  1.5 1.4    Microbiology Recent Results (from the past 240  hour(s))  Resp Panel by RT-PCR (Flu A&B, Covid) Nasopharyngeal Swab     Status: None   Collection Time: 08/04/20  2:55 AM   Specimen: Nasopharyngeal Swab; Nasopharyngeal(NP) swabs in vial transport medium  Result Value Ref Range Status   SARS Coronavirus 2 by RT PCR NEGATIVE NEGATIVE Final    Comment: (NOTE) SARS-CoV-2 target nucleic acids are NOT DETECTED.  The SARS-CoV-2 RNA is generally detectable in upper respiratory specimens during the acute phase of infection. The lowest concentration of SARS-CoV-2 viral copies this assay can detect is 138 copies/mL. A negative result does not preclude SARS-Cov-2 infection and should not be used as the sole basis for treatment or other patient management decisions. A negative result may occur with  improper specimen collection/handling, submission of specimen other than nasopharyngeal swab, presence of viral mutation(s) within the areas targeted by this assay, and inadequate number of viral copies(<138 copies/mL). A negative result must be combined with clinical observations, patient history, and epidemiological information. The expected result is Negative.  Fact Sheet for Patients:  BloggerCourse.com  Fact Sheet for Healthcare Providers:  SeriousBroker.it  This test is no t yet approved or cleared by the [place] FDA and  has been authorized for detection and/or diagnosis of SARS-CoV-2 by FDA under an Emergency Use Authorization (EUA). This EUA will remain  in effect (meaning this test can be used) for the duration of the COVID-19 declaration under Section 564(b)(1) of the Act, 21 U.S.C.section 360bbb-3(b)(1), unless the authorization is terminated  or revoked sooner.       Influenza A by PCR NEGATIVE NEGATIVE Final   Influenza B by PCR NEGATIVE NEGATIVE Final    Comment: (NOTE) The Xpert Xpress SARS-CoV-2/FLU/RSV plus assay is intended as an aid in the diagnosis of influenza from  Nasopharyngeal swab specimens and should not be used as a sole basis for treatment. Nasal washings and  aspirates are unacceptable for Xpert Xpress SARS-CoV-2/FLU/RSV testing.  Fact Sheet for Patients: BloggerCourse.com  Fact Sheet for Healthcare Providers: SeriousBroker.it  This test is not yet approved or cleared by the [place] FDA and has been authorized for detection and/or diagnosis of SARS-CoV-2 by FDA under an Emergency Use Authorization (EUA). This EUA will remain in effect (meaning this test can be used) for the duration of the COVID-19 declaration under Section 564(b)(1) of the Act, 21 U.S.C. section 360bbb-3(b)(1), unless the authorization is terminated or revoked.  Performed at [facility], [address] [place], [place] [address]   Culture, blood (routine x 2)     Status: None   Collection Time: 08/04/20  6:05 AM   Specimen: BLOOD LEFT HAND  Result Value Ref Range Status   Specimen Description BLOOD LEFT HAND  Final   Special Requests   Final    BOTTLES DRAWN AEROBIC AND ANAEROBIC Blood Culture results may not be optimal due to an inadequate volume of blood received in culture bottles   Culture   Final    NO GROWTH 5 DAYS Performed at [facility], [address] [place], [place] [address]    Report Status 08/09/2020 FINAL  Final  Culture, blood (routine x 2)     Status: None   Collection Time: 08/04/20  6:10 AM   Specimen: BLOOD RIGHT HAND  Result Value Ref Range Status   Specimen Description BLOOD RIGHT HAND  Final   Special Requests   Final    BOTTLES DRAWN AEROBIC AND ANAEROBIC Blood Culture results may not be optimal due to an inadequate volume of blood received in culture bottles   Culture   Final    NO GROWTH 5 DAYS Performed at [facility], [address], [place], [place] [address]    Report Status 08/09/2020 FINAL  Final     Medications:    alteplase  2 mg  Intracatheter Once   apixaban  5 mg Oral BID   Chlorhexidine Gluconate Cloth  6 each Topical Daily   guaiFENesin  600 mg Oral BID   linezolid  600 mg Oral Q12H   mexiletine  300 mg Oral Q12H   pantoprazole  40 mg Oral Daily   ranolazine  500 mg Oral BID   sodium chloride flush  10-40 mL Intracatheter Q12H   Continuous Infusions:  amiodarone 30 mg/hr (08/12/20 2350)   milrinone 0.375 mcg/kg/min (08/13/20 0725)      LOS: 9 days   Marinda Elk  [facility]  08/13/2020, 9:57 AM

## 2020-08-13 DEATH — deceased

## 2020-08-14 ENCOUNTER — Other Ambulatory Visit (HOSPITAL_COMMUNITY): Payer: BC Managed Care – PPO

## 2020-08-14 ENCOUNTER — Encounter (HOSPITAL_COMMUNITY): Payer: Self-pay | Admitting: Internal Medicine

## 2020-08-14 ENCOUNTER — Inpatient Hospital Stay (HOSPITAL_COMMUNITY): Payer: BC Managed Care – PPO

## 2020-08-14 DIAGNOSIS — I5043 Acute on chronic combined systolic (congestive) and diastolic (congestive) heart failure: Secondary | ICD-10-CM

## 2020-08-14 LAB — COOXEMETRY PANEL
Carboxyhemoglobin: 1.1 % (ref 0.5–1.5)
Methemoglobin: 0.6 % (ref 0.0–1.5)
O2 Saturation: 60.3 %
Total hemoglobin: 13.5 g/dL (ref 12.0–16.0)

## 2020-08-14 LAB — BASIC METABOLIC PANEL
Anion gap: 13 (ref 5–15)
BUN: 50 mg/dL — ABNORMAL HIGH (ref 6–20)
CO2: 17 mmol/L — ABNORMAL LOW (ref 22–32)
Calcium: 8.8 mg/dL — ABNORMAL LOW (ref 8.9–10.3)
Chloride: 99 mmol/L (ref 98–111)
Creatinine, Ser: 3.34 mg/dL — ABNORMAL HIGH (ref 0.61–1.24)
GFR, Estimated: 21 mL/min — ABNORMAL LOW (ref >60)
Glucose, Bld: 210 mg/dL — ABNORMAL HIGH (ref 70–99)
Potassium: 3.6 mmol/L (ref 3.5–5.1)
Sodium: 129 mmol/L — ABNORMAL LOW (ref 135–145)

## 2020-08-14 LAB — GLUCOSE, CAPILLARY
Glucose-Capillary: 162 mg/dL — ABNORMAL HIGH (ref 70–99)
Glucose-Capillary: 170 mg/dL — ABNORMAL HIGH (ref 70–99)
Glucose-Capillary: 136 mg/dL — ABNORMAL HIGH (ref 70–99)
Glucose-Capillary: 143 mg/dL — ABNORMAL HIGH (ref 70–99)

## 2020-08-14 LAB — CBC WITH DIFFERENTIAL/PLATELET
Abs Immature Granulocytes: 0.62 10*3/uL — ABNORMAL HIGH (ref 0.00–0.07)
Basophils Absolute: 0.1 10*3/uL (ref 0.0–0.1)
Basophils Relative: 1 %
Eosinophils Absolute: 0.3 10*3/uL (ref 0.0–0.5)
Eosinophils Relative: 2 %
HCT: 39.9 % (ref 39.0–52.0)
Hemoglobin: 13.3 g/dL (ref 13.0–17.0)
Immature Granulocytes: 4 %
Lymphocytes Relative: 8 %
Lymphs Abs: 1.1 10*3/uL (ref 0.7–4.0)
MCH: 30.3 pg (ref 26.0–34.0)
MCHC: 33.3 g/dL (ref 30.0–36.0)
MCV: 90.9 fL (ref 80.0–100.0)
Monocytes Absolute: 1.2 10*3/uL — ABNORMAL HIGH (ref 0.1–1.0)
Monocytes Relative: 9 %
Neutro Abs: 11.3 10*3/uL — ABNORMAL HIGH (ref 1.7–7.7)
Neutrophils Relative %: 76 %
Platelets: 285 10*3/uL (ref 150–400)
RBC: 4.39 MIL/uL (ref 4.22–5.81)
RDW: 15.6 % — ABNORMAL HIGH (ref 11.5–15.5)
WBC: 14.7 10*3/uL — ABNORMAL HIGH (ref 4.0–10.5)
nRBC: 0.3 % — ABNORMAL HIGH (ref 0.0–0.2)

## 2020-08-14 LAB — ECHOCARDIOGRAM COMPLETE
Area-P 1/2: 5.06 cm2
Calc EF: 23.7 %
Height: 68 in
S' Lateral: 5.5 cm
Single Plane A2C EF: 26.5 %
Single Plane A4C EF: 21.4 %
Weight: 5255.77 oz

## 2020-08-14 LAB — MAGNESIUM: Magnesium: 2 mg/dL (ref 1.7–2.4)

## 2020-08-14 MED ORDER — POTASSIUM CHLORIDE CRYS ER 20 MEQ PO TBCR
40.0000 meq | EXTENDED_RELEASE_TABLET | ORAL | Status: AC
Start: 2020-08-14 — End: 2020-08-14
  Administered 2020-08-14 (×2): 40 meq via ORAL
  Filled 2020-08-14 (×2): qty 2

## 2020-08-14 MED ORDER — MIDODRINE HCL 5 MG PO TABS
5.0000 mg | ORAL_TABLET | Freq: Three times a day (TID) | ORAL | Status: DC
  Administered 2020-08-14: 5 mg via ORAL
  Filled 2020-08-14: qty 1

## 2020-08-14 MED ORDER — FUROSEMIDE 10 MG/ML IJ SOLN
120.0000 mg | Freq: Once | INTRAVENOUS | Status: AC
  Administered 2020-08-14: 120 mg via INTRAVENOUS
  Filled 2020-08-14: qty 12

## 2020-08-14 MED ORDER — MIDODRINE HCL 5 MG PO TABS
10.0000 mg | ORAL_TABLET | Freq: Three times a day (TID) | ORAL | Status: DC
  Administered 2020-08-14 – 2020-08-16 (×6): 10 mg via ORAL
  Filled 2020-08-14 (×6): qty 2

## 2020-08-14 NOTE — Progress Notes (Signed)
[facility] PROGRESS NOTE    Progress Note  Roberto [name]  KDX:[id number] DOB: [birth year] DOA: 07/13/2020 PCP: [name], MD     Brief Narrative:   Roberto [name] is an 55 y.o. male past medical history of chronic systolic heart failure with a 2D echo in December 2021 that showed an EF of 25% grade 3 diastolic dysfunction recently admitted and discharged on 07/21/2020 for systolic heart failure, he noticed some right lower extremity redness and some discharge from the wound in the ER was found to be with cardiogenic shock and concerns for right lower extremity cellulitis, with imaging concerning for heart failure.  He was also found to be septic due to right lower extremity cellulitis and he will complete a 14-day course has remained afebrile leukocytosis persistently elevated.  BNP of 1400 creatinine of 2.7 white blood cell count of 15.  The advanced heart failure team has been consulted he was started on IV milrinone and IV diuresis with poor response.  Creatinine continues to deteriorate, CO-ox persistently low, he continues to be negative with fair urine output, his weight is not improving and he is persistently hyponatremic. His weight is not improving CVP continues to be elevated.  Patient adamantly refuses to meet with  Palliative Care to discuss end-of-life has an extremely poor prognosis.    Assessment/Plan:   Acute on chronic combined systolic and diastolic CHF (congestive heart failure) (HCC) Advanced heart failure team on board. He still appears significantly overloaded, basic metabolic panels pending. Despite diuretic therapy and milrinone creatinine continues to be elevated.  And with no improvement in his overall hemodynamics. Patient has adamantly refused to meet with palliative care for end-of-life discussion. He relates he wants to move his care to [facility]. The advanced heart failure team relates he is not a candidate for advanced therapy at this  point.  Acute kidney injury on chronic kidney disease stage IIIb: With a baseline creatinine around 2, despite aggressive medical therapy his creatinine today is 3. Likely due to cardiorenal syndrome will defer to the advanced heart failure team. Basic metabolic panel pending  Sepsis due to right lower ext cellulitis: No osteomyelitis seen on imaging Doppler negative for DVT. He has completed his course of antibiotic.  Electrolyte imbalance: Recent metabolic panels pending.  History of VT/IVCD placement: Continue amiodarone drip monitor electrolytes closely.  History of a flutter status post ablation:  continue amiodarone and Eliquis.  History of LV thrombus: Continue Eliquis.     DVT prophylaxis: eliquis Family Communication:none Status is: Inpatient  Remains inpatient appropriate because:Hemodynamically unstable  Dispo: The patient is from: Home              Anticipated d/c is to: Home              Patient currently is not medically stable to d/c.   Difficult to place patient No   Code Status:     Code Status Orders  (From admission, onward)           Start     Ordered   08/04/20 0602  Full code  Continuous        08/04/20 0602           Code Status History     Date Active Date Inactive Code Status Order ID Comments User Context   07/19/2020 0446 07/21/2020 2000 Full Code [id number]  Hillary Bow, DO ED   06/01/2020 1148 06/10/2020 2117 Full Code [id number]  Maxine Glenn  [facility]   04/13/2020 1429 04/19/2020 1944 Full Code [id number]  Clydie Braun, MD Inpatient   12/20/2018 2342 01/24/2019 2317 Full Code [id number]  Rometta Emery, MD Inpatient   09/03/2018 1222 09/04/2018 1736 Full Code [id number]  Regan Lemming, MD Inpatient   03/21/2017 0419 03/26/2017 2255 Full Code [id number]  Ernest Mallick, MD ED   03/21/2017 0417 03/21/2017 0419 Full Code [id number]  Ernest Mallick, MD ED   11/17/2016 1443 11/17/2016 2257 Full Code [id number]  Regan Lemming, MD Inpatient   10/14/2016 1908 10/21/2016 1919 Full Code [id number]  Little Ishikawa, NP Inpatient         IV Access:   Peripheral IV   Procedures and diagnostic studies:   No results found.   Medical Consultants:   None.   Subjective:    Roberto [name] no complaints  Objective:    Vitals:   08/13/20 2001 08/13/20 2315 08/14/20 0359 08/14/20 0410  BP: 111/65 (!) 97/47  (!) 90/58  Pulse: 89 87  89  Resp: 20 20  20   Temp: 97.7 F (36.5 C) 98 F (36.7 C)  98 F (36.7 C)  TempSrc: Oral Oral  Oral  SpO2: 90% 96%  92%  Weight:   (!) 149 kg   Height:       SpO2: 92 % O2 Flow Rate (L/min): 2 L/min   Intake/Output Summary (Last 24 hours) at 08/14/2020 0845 Last data filed at 08/14/2020 0400 Gross per 24 hour  Intake 1025.19 ml  Output 1425 ml  Net -399.81 ml    Filed Weights   08/12/20 0320 08/13/20 0700 08/14/20 0359  Weight: (!) 151.4 kg (!) 149.1 kg (!) 149 kg    Exam: General exam: In no acute distress. Respiratory system: Good air movement and clear to auscultation. Cardiovascular system: S1 & S2 heard, RRR. No JVD. Gastrointestinal system: Abdomen is nondistended, soft and nontender.  Skin: Right lower extremity erythema continues to receded, his lower extremity is still shiny and swollen but not tender to touch.   Data Reviewed:    Labs: Basic Metabolic Panel: Recent Labs  Lab 08/10/20 0440 08/11/20 1142 08/11/20 2124 08/12/20 0512 08/12/20 1750 08/13/20 0232 08/14/20 0415  NA 128* 123* 124* 125* 126* 128*  --   K 5.0 4.1  --  4.0 4.3 3.5  --   CL 100 98  --  96* 97* 98  --   CO2 19* 15*  --  18* 18* 18*  --   GLUCOSE 187* 165*  --  294* 314* 195*  --   BUN 37* 40*  --  40* 44* 45*  --   CREATININE 2.94* 2.92*  --  3.00* 3.07* 3.06*  --   CALCIUM 8.5* 8.3*  --  8.3* 8.3* 8.5*  --   MG 2.2  --   --  2.1 2.1 2.1 2.0    GFR Estimated Creatinine Clearance: 39.3 mL/min (A) (by C-G formula based on SCr of 3.06 mg/dL  (H)). Liver Function Tests: Recent Labs  Lab 08/08/20 0228  AST 26  ALT 30  ALKPHOS 56  BILITOT 1.0  PROT 6.9  ALBUMIN 2.0*    No results for input(s): LIPASE, AMYLASE in the last 168 hours. No results for input(s): AMMONIA in the last 168 hours. Coagulation profile No results for input(s): INR, PROTIME in the last 168 hours. COVID-19 Labs  No results for input(s): DDIMER, FERRITIN, LDH, CRP in the last 72 hours.  Lab Results  Component Value Date   SARSCOV2NAA NEGATIVE 08/04/2020   SARSCOV2NAA NEGATIVE 07/19/2020   SARSCOV2NAA NEGATIVE 06/01/2020   SARSCOV2NAA NEGATIVE 04/13/2020    CBC: Recent Labs  Lab 08/10/20 0440 08/11/20 0520 08/12/20 0512 08/13/20 0232 08/14/20 0415  WBC 15.8* 12.0* 14.5* 14.7* 14.7*  NEUTROABS 11.2* 10.8* 10.5* 11.8* 11.3*  HGB 13.7 11.9* 13.2 13.2 13.3  HCT 42.6 38.6* 40.1 40.0 39.9  MCV 93.0 97.0 92.4 90.3 90.9  PLT 246 208 246 253 285    Cardiac Enzymes: No results for input(s): CKTOTAL, CKMB, CKMBINDEX, TROPONINI in the last 168 hours. BNP (last 3 results) No results for input(s): PROBNP in the last 8760 hours. CBG: Recent Labs  Lab 08/13/20 1116 08/13/20 1636 08/13/20 2108 08/14/20 0621  GLUCAP 201* 112* 163* 162*   D-Dimer: No results for input(s): DDIMER in the last 72 hours. Hgb A1c: Recent Labs    08/13/20 0232  HGBA1C 7.4*   Lipid Profile: No results for input(s): CHOL, HDL, LDLCALC, TRIG, CHOLHDL, LDLDIRECT in the last 72 hours. Thyroid function studies: No results for input(s): TSH, T4TOTAL, T3FREE, THYROIDAB in the last 72 hours.  Invalid input(s): FREET3 Anemia work up: No results for input(s): VITAMINB12, FOLATE, FERRITIN, TIBC, IRON, RETICCTPCT in the last 72 hours. Sepsis Labs: Recent Labs  Lab 08/11/20 0520 08/12/20 0512 08/12/20 1750 08/13/20 0232 08/14/20 0415  WBC 12.0* 14.5*  --  14.7* 14.7*  LATICACIDVEN  --   --  1.5 1.4  --     Microbiology No results found for this or any  previous visit (from the past 240 hour(s)).    Medications:    alteplase  2 mg Intracatheter Once   apixaban  5 mg Oral BID   Chlorhexidine Gluconate Cloth  6 each Topical Daily   guaiFENesin  600 mg Oral BID   insulin aspart  0-5 Units Subcutaneous QHS   insulin aspart  0-9 Units Subcutaneous TID WC   insulin aspart  2 Units Subcutaneous TID WC   mexiletine  300 mg Oral Q12H   midodrine  2.5 mg Oral TID WC   pantoprazole  40 mg Oral Daily   ranolazine  500 mg Oral BID   sodium chloride flush  10-40 mL Intracatheter Q12H   Continuous Infusions:  amiodarone 30 mg/hr (08/14/20 0400)   milrinone 0.375 mcg/kg/min (08/14/20 0619)      LOS: 10 days   Marinda Elk  [facility]  08/14/2020, 8:45 AM

## 2020-08-14 NOTE — Progress Notes (Signed)
Spoke to patient and his family earlier this afternoon as a follow-up to our goals of care to discussion this morning.   Patient says he is going to move to [facility] to live with his brother and would like to transfer his HF care to [facility] HF team.   I explained that they have recently not been accepting new patients due to staffing shortages but that I would reach out to Dr. Dot Lanes at [facility] to see if they would be w illing to see him for a second opinion/new patient evaluation at the patient's and family's request.   I reiterated that he is not a candidate for advanced therapies at this point.    Arvilla Meres, MD

## 2020-08-14 NOTE — Progress Notes (Signed)
CVP 10 SBP 90s.    Increase midodrine to 10 mg tid.  Stop amiodarone .[name] Continue milrinone 0.375 mcg. CO-OX 60%.   Beckie Viscardi NP-C  4:47 PM

## 2020-08-14 NOTE — Progress Notes (Signed)
  Echocardiogram 2D Echocardiogram has been performed.  Augustine Radar 08/14/2020, 4:11 PM

## 2020-08-14 NOTE — TOC Initial Note (Addendum)
Transition of Care [facility]) - Initial/Assessment Note    Patient Details  Name: Roberto [name] MRN: [id number] Date of Birth: [birth year]  Transition [facility]) CM/SW Contact:    Elliot Cousin, RN Phone Number: [phone] 08/14/2020, 5:12 PM  Clinical Narrative:                 TOC CM spoke to pt and son/daughter at bedside. Pt was lethargic and difficult to arouse. Pt's son, Joselyn Glassman states plan is go and stay with brother temp, Kiree Dejarnette. Pt was working and driving to his own appts prior to hospital stay. Pt will need HH RN and PT. Also will need to evaluate for oxygen at dc. Will need HH RN and PT orders. Will send referral to [facility] rep, Stacie to review. Will continue to follow for dc needs.   Expected Discharge Plan: Home w Home Health Services Barriers to Discharge: Continued Medical Work up   Patient Goals and CMS Choice   CMS Medicare.gov Compare Post Acute Care list provided to:: Patient Represenative (must comment) (son-Tyler) Choice offered to / list presented to : Adult Children  Expected Discharge Plan and Services Expected Discharge Plan: Home w Home Health Services In-house Referral: Clinical Social Work Discharge Planning Services: CM Consult Post Acute Care Choice: Home Health Living arrangements for the past 2 months: Apartment                                      Prior Living Arrangements/Services Living arrangements for the past 2 months: Apartment Lives with:: Self Patient language and need for interpreter reviewed:: Yes Do you feel safe going back to the place where you live?: Yes               Activities of Daily Living Home Assistive Devices/Equipment: None ADL Screening (condition at time of admission) Patient's cognitive ability adequate to safely complete daily activities?: Yes Is the patient deaf or have difficulty hearing?: No Does the patient have difficulty seeing, even when wearing glasses/contacts?: No Does the  patient have difficulty concentrating, remembering, or making decisions?: No Patient able to express need for assistance with ADLs?: Yes Does the patient have difficulty dressing or bathing?: No Independently performs ADLs?: Yes (appropriate for developmental age) Does the patient have difficulty walking or climbing stairs?: Yes Weakness of Legs: None Weakness of Arms/Hands: Both  Permission Sought/Granted Permission sought to share information with : Case Manager, PCP, Family Supports Permission granted to share information with : Yes, Verbal Permission Granted  Share Information with NAME: Xion Debruyne  Permission granted to share info w AGENCY: Home Health  Permission granted to share info w Relationship: son  Permission granted to share info w Contact Information: [phone]  Emotional Assessment Appearance:: Appears stated age Attitude/Demeanor/Rapport: Lethargic Affect (typically observed): Accepting Orientation: : Oriented to Self, Oriented to Place, Oriented to  Time, Oriented to Situation   Psych Involvement: No (comment)  Admission diagnosis:  Acute CHF (congestive heart failure) (HCC) [I50.9] Acute systolic congestive heart failure (HCC) [I50.21] Cellulitis of right leg [L03.115] AKI (acute kidney injury) (HCC) [N17.9] Patient Active Problem List   Diagnosis Date Noted   AKI (acute kidney injury) (HCC)    Acute CHF (congestive heart failure) (HCC) 08/04/2020   Cellulitis of right leg 08/04/2020   Acute on chronic congestive heart failure (HCC) 07/20/2020   Chronic kidney disease, stage 3b (HCC) 07/19/2020  Acute kidney injury superimposed on chronic kidney disease (HCC) 06/10/2020   Chronic systolic CHF (congestive heart failure) (HCC)    Ventricular tachyarrhythmia (HCC) 06/01/2020   Acute on chronic combined systolic and diastolic CHF (congestive heart failure) (HCC) 04/13/2020   S/P implantation of automatic cardioverter/defibrillator (AICD) 04/13/2020   Hx of  diverticulitis of colon 12/06/2019   History of colon resection 12/06/2019   History of creation of ostomy (HCC) 12/06/2019   History of colonic polyps 12/06/2019   Hypomagnesemia    History of LV (left ventricular) mural thrombus 09/04/2018   History of DVT (deep vein thrombosis) 09/04/2018   PAF (paroxysmal atrial fibrillation) (HCC) 09/04/2018   Gout 05/06/2018   Atypical atrial flutter (HCC) 03/17/2017   Chronic pain of right knee 03/13/2017   Typical atrial flutter s/p catheter ablation    Varicose veins of legs 12/17/2015   Encounter for health maintenance examination in adult 12/17/2015   Special screening for malignant neoplasms, colon 12/17/2015   Vaccine counseling 12/17/2015   Knee deformity, acquired, right 12/17/2015   Gait disturbance 12/17/2015   Osteoarthritis of right knee 12/17/2015   Varicose veins of lower extremities with ulcer (HCC) 09/18/2015   Need for prophylactic vaccination and inoculation against influenza 09/18/2015   Tinea versicolor 06/15/2015   Impaired fasting blood sugar 12/28/2014   Chronic anticoagulation 12/28/2014   Erectile dysfunction 02/19/2012   Chronic systolic heart failure (HCC) 12/29/2011   Hypokalemia 12/24/2011   Lupus anticoagulant positive 12/22/2011   Nonischemic cardiomyopathy (HCC) 12/16/2011   Paroxysmal SVT (supraventricular tachycardia) (HCC) 12/16/2011   History of venous thromboembolism 12/16/2011   Venous insufficiency 11/04/2011   Morbid obesity with BMI of 50.0-59.9, adult (HCC) 06/02/2008   PCP:  [name], MD Pharmacy:   [facility] [phone] [place], [place] - [address] AT [facility] OF [address] & [facility] [address] [place] [address] Phone: [phone] Fax: [phone]     Social Determinants of Health (SDOH) Interventions    Readmission Risk Interventions Readmission Risk Prevention Plan 07/21/2020 04/19/2020 01/04/2019  Transportation Screening Complete Complete Complete   PCP or Specialist Appt within 5-7 Days - Complete -  PCP or Specialist Appt within 3-5 Days - - Not Complete  Not Complete comments - - pt not stable for dc at this time  Home Care Screening - Complete -  Medication Review (RN CM) - Complete -  HRI or Home Care Consult Complete - Complete  Social Work Consult for Recovery Care Planning/Counseling Complete - Complete  Palliative Care Screening Not Applicable - Not Applicable  Medication Review (RN Care Manager) Complete - Referral to Pharmacy  Some recent data might be hidden

## 2020-08-14 NOTE — Progress Notes (Addendum)
Patient ID: Roberto [name], male   DOB: 1965-12-18, 55 y.o.   MRN: [id number]     Advanced Heart Failure Rounding Note  PCP-Cardiologist: Glori Bickers, MD   Subjective:   Yesterday midodrine started and given 120 mg IV lasix x1.  Remains on milrinoe 0.375 mcg. CO-OX 60%  On linezolid for RLE cellulitis.    No BMET/CVP   SOB at rest. Feels a little worse today.     Objective:   Weight Range: (!) 149 kg Body mass index is 49.95 kg/m.   Vital Signs:   Temp:  [97.7 F (36.5 C)-98 F (36.7 C)] 98 F (36.7 C) (08/02 0410) Pulse Rate:  [81-89] 89 (08/02 0410) Resp:  [17-20] 20 (08/02 0410) BP: (90-111)/(47-81) 90/58 (08/02 0410) SpO2:  [90 %-96 %] 92 % (08/02 0410) Weight:  [149 kg] 149 kg (08/02 0359) Last BM Date: 08/14/20  Weight change: Filed Weights   08/12/20 0320 08/13/20 0700 08/14/20 0359  Weight: (!) 151.4 kg (!) 149.1 kg (!) 149 kg    Intake/Output:   Intake/Output Summary (Last 24 hours) at 08/14/2020 0901 Last data filed at 08/14/2020 0858 Gross per 24 hour  Intake 1200.19 ml  Output 1625 ml  Net -424.81 ml      Physical Exam  General:  Sitting in the chair.  No resp difficulty HEENT: normal Neck: supple. JVP difficult to assess.  Carotids 2+ bilat; no bruits. No lymphadenopathy or thryomegaly appreciated. Cor: PMI nondisplaced. Regular rate & rhythm. No rubs, gallops or murmurs. Lungs: Diminished. Abdomen: obese, soft, nontender, nondistended. No hepatosplenomegaly. No bruits or masses. Good bowel sounds.+ RLE ileostomy  Extremities: no cyanosis, clubbing, rash, R and LLE 2+ edema. RUE PICC  Neuro: alert & orientedx3, cranial nerves grossly intact. moves all 4 extremities w/o difficulty. Affect pleasant  Telemetry   NSR 70s with occasional PVCs   Labs    CBC Recent Labs    08/13/20 0232 08/14/20 0415  WBC 14.7* 14.7*  NEUTROABS 11.8* 11.3*  HGB 13.2 13.3  HCT 40.0 39.9  MCV 90.3 90.9  PLT 253 818   Basic Metabolic Panel Recent  Labs    08/12/20 1750 08/13/20 0232 08/14/20 0415  NA 126* 128*  --   K 4.3 3.5  --   CL 97* 98  --   CO2 18* 18*  --   GLUCOSE 314* 195*  --   BUN 44* 45*  --   CREATININE 3.07* 3.06*  --   CALCIUM 8.3* 8.5*  --   MG 2.1 2.1 2.0   Liver Function Tests No results for input(s): AST, ALT, ALKPHOS, BILITOT, PROT, ALBUMIN in the last 72 hours.  No results for input(s): LIPASE, AMYLASE in the last 72 hours. Cardiac Enzymes No results for input(s): CKTOTAL, CKMB, CKMBINDEX, TROPONINI in the last 72 hours.  BNP: BNP (last 3 results) Recent Labs    07/19/20 0222 07/23/20 1117 07/21/2020 1707  BNP 959.9* 992.0* 1,426.8*    ProBNP (last 3 results) No results for input(s): PROBNP in the last 8760 hours.   D-Dimer No results for input(s): DDIMER in the last 72 hours. Hemoglobin A1C Recent Labs    08/13/20 0232  HGBA1C 7.4*   Fasting Lipid Panel No results for input(s): CHOL, HDL, LDLCALC, TRIG, CHOLHDL, LDLDIRECT in the last 72 hours. Thyroid Function Tests No results for input(s): TSH, T4TOTAL, T3FREE, THYROIDAB in the last 72 hours.  Invalid input(s): FREET3  Other results:   Imaging    No results found.  Medications:     Scheduled Medications:  alteplase  2 mg Intracatheter Once   apixaban  5 mg Oral BID   Chlorhexidine Gluconate Cloth  6 each Topical Daily   guaiFENesin  600 mg Oral BID   insulin aspart  0-5 Units Subcutaneous QHS   insulin aspart  0-9 Units Subcutaneous TID WC   insulin aspart  2 Units Subcutaneous TID WC   mexiletine  300 mg Oral Q12H   midodrine  2.5 mg Oral TID WC   pantoprazole  40 mg Oral Daily   ranolazine  500 mg Oral BID   sodium chloride flush  10-40 mL Intracatheter Q12H    Infusions:  amiodarone 30 mg/hr (08/14/20 0400)   milrinone 0.375 mcg/kg/min (08/14/20 0619)    PRN Medications: acetaminophen **OR** acetaminophen, benzonatate, guaiFENesin-dextromethorphan, ondansetron (ZOFRAN) IV, sodium chloride  flush   Assessment/Plan   1. Acute on chronic combined CHF:  -NICM.  Echo 12/21 with EF 25-30%, moderate LV dilation. Has medtronic ICD.  Patient sent from office visit 08/10/2020 with cellulitis, as well as acute on chronic combined CHF. Lactate elevated to 3.6. Suspect cellulitis with sepsis in setting of acute/chronic systolic CHF. Nearing end-stage. Diuretic stopped 7/28 due to worsening renal function, restarted 7/30 with rising CVP not set up. Check BMET now.  -Co-ox 60% on 0.375 milrinone, PVC burden improved -Increase midodrine to 5 mg tid.   -Hold carvedilol and spironolactone in the setting of decompensated CHF and AKI -Off digoxin and Farxiga with rising creatinine.   - Volume status up. Given 120 mg IV lasix. Place unna boots.  -Had discussion about GOC again today given recurrent HF exacerbations. Poor prognosis and struggling with end-stage HF + cardiorenal syndrome.  Not a candidate for advanced therapies given size and ileostomy. He wishes to remain full code for now. He will discuss his wishes with his family members today, will revisit tomorrow. Has met with palliative care, not interested in seeing them again at this point.  2. RLE cellulitis:  - Severe sepsis. Patient injured his leg PTA. No evidence of osteomyelitis or abscess on CT.  - Treated with vancomycin and cefepime. Now on linezolid.  - Continue management per primary team  3. History of VT:  - Admission 05/2020 for VT with recurrence during admission, felt to be driving by electrolyte imbalance. Seen by EP and started on amiodarone and mexiletine with no evidence of recurrence on last interrogation 07/19/20. Patient denies ICD shocks - Very frequent PVCs and runs of NSVT on tele initially, now improved.  - Continue amio gtt at 30/hr - Continue mexiletine - Continue to monitor electrolytes closely and replete as needed to maintain K >4, Mg >2  4. History of LV thrombus:  - Continue Eliquis - No evidence of bleeding.  Hgb stable.  5. Atrial flutter s/p ablation:  - In SR  - Continue eliquis.  - Continue amiodarone for rhythm control  6. AKI on CKD stage 3b:  - Variable baseline creatinine 1.8-2.5.   - 2.7 on admit, down to 1.95. Now 2.0 > 2.27 > 2.6>>2.9>>3>>3.06>> check now.  - Likely cardiorenal  7. Diverticulitis s/p ileostomy  8. Hyponatremia: Hypervolemic hyponatremia.  -BMET reordered.   Check ECHO today.   Dr Haroldine Laws is reaching out to [facility] per patient request on 8/1. Today he and his son tell me he wants to stay realizing he has limited options.     Length of Stay: [place], NP  08/14/2020, 9:01 AM  Advanced  Heart Failure Team Pager [phone] (M-F; 7a - 5p)  Please contact [facility] for night-coverage after hours (5p -7a ) and weekends on [email or web address]   Patient seen and examined with the above-signed Advanced Practice Provider and/or Housestaff. I personally reviewed laboratory data, imaging studies and relevant notes. I independently examined the patient and formulated the important aspects of the plan. I have edited the note to reflect any of my changes or salient points. I have personally discussed the plan with the patient and/or family.  Feels weak. More SOB this am. A bit better this afternoon. Co-ox 60% on milrinone 0.375 but SBP running 80-90s. CVP 10 , SCr up to 3.4  Echo today LVEF ~20% RV mild to moderately HK  General:  Weak appearing. No resp difficulty HEENT: normal Neck: supple. nJVP 10 Carotids 2+ bilat; no bruits. No lymphadenopathy or thryomegaly appreciated. Cor: PMI nondisplaced. Regular rate & rhythm. +s3 Lungs: clear Abdomen: obese soft, nontender, nondistended. No hepatosplenomegaly. No bruits or masses. Good bowel sounds. + ileostomy Extremities: no cyanosis, clubbing, rash, 2+ edema Neuro: alert & orientedx3, cranial nerves grossly intact. moves all 4 extremities w/o difficulty. Affect pleasant  He remains quite tenuous. EF worse on echo  today. Co-ox ok on milrinone but renal function worsening. May be related to low BP.   Will continue milrinone. Diurese as tolerated. Increase midodrine.   I connected with HF program at [facility] and they would be willing to see him as outpatient but family and patient content on staying here for now. Options very limited.   If creatinine continues to worsen may need to consider short course of NE but he is not candidate for advanced therapies so not a durable solution.   Glori Bickers, MD  7:02 PM

## 2020-08-15 ENCOUNTER — Encounter (HOSPITAL_COMMUNITY): Payer: BC Managed Care – PPO | Admitting: Internal Medicine

## 2020-08-15 LAB — BASIC METABOLIC PANEL
Anion gap: 15 (ref 5–15)
Anion gap: 10 (ref 5–15)
BUN: 52 mg/dL — ABNORMAL HIGH (ref 6–20)
BUN: 51 mg/dL — ABNORMAL HIGH (ref 6–20)
CO2: 17 mmol/L — ABNORMAL LOW (ref 22–32)
CO2: 17 mmol/L — ABNORMAL LOW (ref 22–32)
Calcium: 9.1 mg/dL (ref 8.9–10.3)
Calcium: 8.8 mg/dL — ABNORMAL LOW (ref 8.9–10.3)
Chloride: 99 mmol/L (ref 98–111)
Chloride: 102 mmol/L (ref 98–111)
Creatinine, Ser: 3.55 mg/dL — ABNORMAL HIGH (ref 0.61–1.24)
Creatinine, Ser: 3.45 mg/dL — ABNORMAL HIGH (ref 0.61–1.24)
GFR, Estimated: 20 mL/min — ABNORMAL LOW (ref >60)
GFR, Estimated: 20 mL/min — ABNORMAL LOW (ref >60)
Glucose, Bld: 131 mg/dL — ABNORMAL HIGH (ref 70–99)
Glucose, Bld: 141 mg/dL — ABNORMAL HIGH (ref 70–99)
Potassium: 4.4 mmol/L (ref 3.5–5.1)
Potassium: 4 mmol/L (ref 3.5–5.1)
Sodium: 131 mmol/L — ABNORMAL LOW (ref 135–145)
Sodium: 129 mmol/L — ABNORMAL LOW (ref 135–145)

## 2020-08-15 LAB — CBC WITH DIFFERENTIAL/PLATELET
Abs Immature Granulocytes: 0.56 10*3/uL — ABNORMAL HIGH (ref 0.00–0.07)
Basophils Absolute: 0.1 10*3/uL (ref 0.0–0.1)
Basophils Relative: 1 %
Eosinophils Absolute: 0.2 10*3/uL (ref 0.0–0.5)
Eosinophils Relative: 2 %
HCT: 40.4 % (ref 39.0–52.0)
Hemoglobin: 13.3 g/dL (ref 13.0–17.0)
Immature Granulocytes: 4 %
Lymphocytes Relative: 7 %
Lymphs Abs: 1 10*3/uL (ref 0.7–4.0)
MCH: 30.2 pg (ref 26.0–34.0)
MCHC: 32.9 g/dL (ref 30.0–36.0)
MCV: 91.6 fL (ref 80.0–100.0)
Monocytes Absolute: 1.3 10*3/uL — ABNORMAL HIGH (ref 0.1–1.0)
Monocytes Relative: 9 %
Neutro Abs: 10.5 10*3/uL — ABNORMAL HIGH (ref 1.7–7.7)
Neutrophils Relative %: 77 %
Platelets: 292 10*3/uL (ref 150–400)
RBC: 4.41 MIL/uL (ref 4.22–5.81)
RDW: 15.7 % — ABNORMAL HIGH (ref 11.5–15.5)
WBC: 13.6 10*3/uL — ABNORMAL HIGH (ref 4.0–10.5)
nRBC: 0.5 % — ABNORMAL HIGH (ref 0.0–0.2)

## 2020-08-15 LAB — GLUCOSE, CAPILLARY
Glucose-Capillary: 160 mg/dL — ABNORMAL HIGH (ref 70–99)
Glucose-Capillary: 137 mg/dL — ABNORMAL HIGH (ref 70–99)
Glucose-Capillary: 177 mg/dL — ABNORMAL HIGH (ref 70–99)
Glucose-Capillary: 156 mg/dL — ABNORMAL HIGH (ref 70–99)

## 2020-08-15 LAB — COOXEMETRY PANEL
Carboxyhemoglobin: 1.1 % (ref 0.5–1.5)
Carboxyhemoglobin: 1.4 % (ref 0.5–1.5)
Methemoglobin: 0.9 % (ref 0.0–1.5)
Methemoglobin: 0.8 % (ref 0.0–1.5)
O2 Saturation: 47.4 %
O2 Saturation: 64.7 %
Total hemoglobin: 13.7 g/dL (ref 12.0–16.0)
Total hemoglobin: 12.9 g/dL (ref 12.0–16.0)

## 2020-08-15 LAB — CBC
HCT: 41.5 % (ref 39.0–52.0)
Hemoglobin: 13.5 g/dL (ref 13.0–17.0)
MCH: 29.8 pg (ref 26.0–34.0)
MCHC: 32.5 g/dL (ref 30.0–36.0)
MCV: 91.6 fL (ref 80.0–100.0)
Platelets: 329 10*3/uL (ref 150–400)
RBC: 4.53 MIL/uL (ref 4.22–5.81)
RDW: 15.7 % — ABNORMAL HIGH (ref 11.5–15.5)
WBC: 14.3 10*3/uL — ABNORMAL HIGH (ref 4.0–10.5)
nRBC: 0.5 % — ABNORMAL HIGH (ref 0.0–0.2)

## 2020-08-15 LAB — MAGNESIUM
Magnesium: 2 mg/dL (ref 1.7–2.4)
Magnesium: 2 mg/dL (ref 1.7–2.4)

## 2020-08-15 MED ORDER — TRAZODONE HCL 50 MG PO TABS: 50.0000 mg | ORAL_TABLET | Freq: Every evening | ORAL | Status: DC | PRN

## 2020-08-15 MED ORDER — FUROSEMIDE 10 MG/ML IJ SOLN
80.0000 mg | Freq: Once | INTRAMUSCULAR | Status: AC
  Administered 2020-08-15: 80 mg via INTRAVENOUS
  Filled 2020-08-15: qty 8

## 2020-08-15 MED ORDER — AMIODARONE HCL 200 MG PO TABS
200.0000 mg | ORAL_TABLET | Freq: Two times a day (BID) | ORAL | Status: DC
  Administered 2020-08-15 – 2020-08-16 (×4): 200 mg via ORAL
  Filled 2020-08-15 (×4): qty 1

## 2020-08-15 NOTE — Progress Notes (Signed)
PROGRESS NOTE    Roberto [name]  HLK:[id number] DOB: 06-23-1965 DOA: 07/13/2020 PCP: [name], MD   Brief Narrative:  55 y.o. male past medical history of chronic systolic heart failure with a 2D echo in December 2021 that showed an EF of 25% grade 3 diastolic dysfunction recently admitted and discharged on 07/21/2020 for systolic heart failure, he noticed some right lower extremity redness and some discharge from the wound in the ER was found to be with cardiogenic shock and concerns for right lower extremity cellulitis, with imaging concerning for heart failure.  He was also found to be septic due to right lower extremity cellulitis and he will complete a 14-day course has remained afebrile leukocytosis persistently elevated. BNP of 1400 creatinine of 2.7 white blood cell count of 15.  The advanced heart failure team has been consulted he was started on IV milrinone and IV diuresis with poor response.  Creatinine continues to deteriorate, CO-ox persistently low, he continues to be negative with fair urine output, his weight is not improving and he is persistently hyponatremic. His weight is not improving CVP continues to be elevated.  Patient adamantly refuses to meet with  Palliative Care to discuss end-of-life has an extremely poor prognosis.   Assessment & Plan:   Principal Problem:   Acute on chronic combined systolic and diastolic CHF (congestive heart failure) (HCC) Active Problems:   Chronic anticoagulation   Atypical atrial flutter (HCC)   Chronic kidney disease, stage 3b (HCC)   Cellulitis of right leg   AKI (acute kidney injury) (HCC)    Acute on chronic combined systolic and diastolic CHF (congestive heart failure) (HCC) -Echo shows EF 25%.  CHF team following - On midodrine 10 mg 3 times daily.  On milrinone. - Unna boots in place - Lasix therapy guided by CHF team - Not a candidate for advanced CHF therapies.  Seen by palliative care team.  Low threshold to  transition to ICU.    Acute kidney injury on chronic kidney disease stage IIIb: Cardiorenal syndrome -Baseline creatinine 2.0.  Creatinine is up to 3.55.  Sepsis due to right lower ext cellulitis: -Dopplers negative for DVT.  Currently on linezolid complete total 14-day course   History of VT/IVCD placement: -Management per CHF team.  Currently on amiodarone drip, mexiletine  History of a flutter status post ablation: -On amiodarone and Eliquis  History of LV thrombus: -Continue Eliquis      DVT prophylaxis: Eliquis Code Status: Full code Family Communication: Daughter at bedside  Status is: Inpatient  Remains inpatient appropriate because:Inpatient level of care appropriate due to severity of illness  Dispo: The patient is from: Home              Anticipated d/c is to: Home              Patient currently is not medically stable to d/c.  Tiviv Lasix treatment with close monitoring of renal function   Difficult to place patient No       Nutritional status           Body mass index is 49.81 kg/m.           Subjective: Still has exertional dyspnea.  No other new complaints at this time.  Review of Systems Otherwise negative except as per HPI, including: General: Denies fever, chills, night sweats or unintended weight loss. Resp: Denies cough, wheezing, shortness of breath. Cardiac: Denies chest pain, palpitations, orthopnea, paroxysmal nocturnal dyspnea. GI: Denies abdominal  pain, nausea, vomiting, diarrhea or constipation GU: Denies dysuria, frequency, hesitancy or incontinence MS: Denies muscle aches, joint pain or swelling Neuro: Denies headache, neurologic deficits (focal weakness, numbness, tingling), abnormal gait Psych: Denies anxiety, depression, SI/HI/AVH Skin: Denies new rashes or lesions ID: Denies sick contacts, exotic exposures, travel  Examination:  General exam: Appears calm and comfortable  Respiratory system: Diminished breath  sounds bilaterally especially at bases Cardiovascular system: S1 & S2 heard, RRR. 8-10cm JVD, murmurs, rubs, gallops or clicks.  2+ bilateral lower extremity pitting edema Gastrointestinal system: Abdomen is nondistended, soft and nontender. No organomegaly or masses felt. Normal bowel sounds heard. Central nervous system: Alert and oriented. No focal neurological deficits. Extremities: Symmetric 5 x 5 power. Skin: Bilateral lower extremity compression dressings in place Psychiatry: Judgement and insight appear normal. Mood & affect appropriate.     Objective: Vitals:   08/14/20 1922 08/14/20 2305 08/15/20 0430 08/15/20 0759  BP: (!) 90/39 96/70 114/81 (!) 117/102  Pulse:  89 87 93  Resp: 20 17 20 16   Temp: 97.6 F (36.4 C) (!) 97.5 F (36.4 C) 97.6 F (36.4 C) (!) 97.4 F (36.3 C)  TempSrc: Oral Oral Oral Oral  SpO2: 95% 91% 95% 96%  Weight:   (!) 148.6 kg   Height:        Intake/Output Summary (Last 24 hours) at 08/15/2020 0922 Last data filed at 08/15/2020 10/15/2020 Gross per 24 hour  Intake 1367.31 ml  Output 875 ml  Net 492.31 ml   Filed Weights   08/13/20 0700 08/14/20 0359 08/15/20 0430  Weight: (!) 149.1 kg (!) 149 kg (!) 148.6 kg     Data Reviewed:   CBC: Recent Labs  Lab 08/11/20 0520 08/12/20 0512 08/13/20 0232 08/14/20 0415 08/15/20 0550  WBC 12.0* 14.5* 14.7* 14.7* 13.6*  NEUTROABS 10.8* 10.5* 11.8* 11.3* 10.5*  HGB 11.9* 13.2 13.2 13.3 13.3  HCT 38.6* 40.1 40.0 39.9 40.4  MCV 97.0 92.4 90.3 90.9 91.6  PLT 208 246 253 285 292   Basic Metabolic Panel: Recent Labs  Lab 08/12/20 0512 08/12/20 1750 08/13/20 0232 08/14/20 0415 08/14/20 0954 08/15/20 0550  NA 125* 126* 128*  --  129* 131*  K 4.0 4.3 3.5  --  3.6 4.4  CL 96* 97* 98  --  99 99  CO2 18* 18* 18*  --  17* 17*  GLUCOSE 294* 314* 195*  --  210* 131*  BUN 40* 44* 45*  --  50* 52*  CREATININE 3.00* 3.07* 3.06*  --  3.34* 3.55*  CALCIUM 8.3* 8.3* 8.5*  --  8.8* 9.1  MG 2.1 2.1 2.1 2.0  --   2.0   GFR: Estimated Creatinine Clearance: 33.8 mL/min (A) (by C-G formula based on SCr of 3.55 mg/dL (H)). Liver Function Tests: No results for input(s): AST, ALT, ALKPHOS, BILITOT, PROT, ALBUMIN in the last 168 hours. No results for input(s): LIPASE, AMYLASE in the last 168 hours. No results for input(s): AMMONIA in the last 168 hours. Coagulation Profile: No results for input(s): INR, PROTIME in the last 168 hours. Cardiac Enzymes: No results for input(s): CKTOTAL, CKMB, CKMBINDEX, TROPONINI in the last 168 hours. BNP (last 3 results) No results for input(s): PROBNP in the last 8760 hours. HbA1C: Recent Labs    08/13/20 0232  HGBA1C 7.4*   CBG: Recent Labs  Lab 08/14/20 0621 08/14/20 1123 08/14/20 1639 08/14/20 2200 08/15/20 0621  GLUCAP 162* 170* 136* 143* 160*   Lipid Profile: No results for input(s):  CHOL, HDL, LDLCALC, TRIG, CHOLHDL, LDLDIRECT in the last 72 hours. Thyroid Function Tests: No results for input(s): TSH, T4TOTAL, FREET4, T3FREE, THYROIDAB in the last 72 hours. Anemia Panel: No results for input(s): VITAMINB12, FOLATE, FERRITIN, TIBC, IRON, RETICCTPCT in the last 72 hours. Sepsis Labs: Recent Labs  Lab 08/12/20 1750 08/13/20 0232  LATICACIDVEN 1.5 1.4    No results found for this or any previous visit (from the past 240 hour(s)).       Radiology Studies: ECHOCARDIOGRAM COMPLETE  Result Date: 08/14/2020    ECHOCARDIOGRAM REPORT   Patient Name:   Rober[facility]JOSEPH [facility] Date of Exam: 08/14/2020 Medical Rec #:  [id number]      Height:       68.0 in Accession #:    [id number]     Weight:       328.5 lb Date of Birth:  Dec 14, 1965      BSA:          2.524 m Patient Age:    54 years       BP:           97/78 mmHg Patient Gender: M              HR:           86 bpm. Exam Location:  Inpatient Procedure: 2D Echo, Cardiac Doppler and Color Doppler Indications:    CHF  History:        Patient has prior history of Echocardiogram examinations, most                  recent 12/14/2019. CHF and Cardiomyopathy; CAD.  Sonographer:    Eulah PontSarah Pirrotta RDCS Referring Phys: [phone] AMY D CLEGG IMPRESSIONS  1. Left ventricular ejection fraction, by estimation, is 20%. The left ventricle has severely decreased function. The left ventricle demonstrates global hypokinesis. The left ventricular internal cavity size was moderately dilated. There is mild eccentric left ventricular hypertrophy. Left ventricular diastolic parameters are consistent with Grade III diastolic dysfunction (restrictive). Elevated left atrial pressure.  2. Right ventricular systolic function is mildly reduced. The right ventricular size is normal. There is moderately elevated pulmonary artery systolic pressure.  3. The mitral valve is normal in structure. Moderate mitral valve regurgitation. No evidence of mitral stenosis.  4. Tricuspid valve regurgitation is moderate.  5. The aortic valve is grossly normal. Aortic valve regurgitation is not visualized. No aortic stenosis is present.  6. The inferior vena cava is dilated in size with <50% respiratory variability, suggesting right atrial pressure of 15 mmHg. Comparison(s): Prior images reviewed side by side. The left ventricular function is worsened. The right ventricular systolic function is worse. FINDINGS  Left Ventricle: Left ventricular ejection fraction, by estimation, is 20%. The left ventricle has severely decreased function. The left ventricle demonstrates global hypokinesis. The left ventricular internal cavity size was moderately dilated. There is  mild eccentric left ventricular hypertrophy. Left ventricular diastolic parameters are consistent with Grade III diastolic dysfunction (restrictive). Elevated left atrial pressure. Right Ventricle: The right ventricular size is normal. No increase in right ventricular wall thickness. Right ventricular systolic function is mildly reduced. There is moderately elevated pulmonary artery systolic pressure. The tricuspid  regurgitant velocity is 2.86 m/s, and with an assumed right atrial pressure of 15 mmHg, the estimated right ventricular systolic pressure is 47.7 mmHg. Left Atrium: Left atrial size was normal in size. Right Atrium: Right atrial size was normal in size. Pericardium: There is no evidence of pericardial effusion. Mitral  Valve: The mitral valve is normal in structure. Moderate mitral valve regurgitation, with eccentric posteriorly directed jet. No evidence of mitral valve stenosis. Tricuspid Valve: The tricuspid valve is normal in structure. Tricuspid valve regurgitation is moderate . No evidence of tricuspid stenosis. Aortic Valve: The aortic valve is grossly normal. Aortic valve regurgitation is not visualized. No aortic stenosis is present. Pulmonic Valve: The pulmonic valve was grossly normal. Pulmonic valve regurgitation is not visualized. No evidence of pulmonic stenosis. Aorta: The aortic root is normal in size and structure. Venous: The inferior vena cava is dilated in size with less than 50% respiratory variability, suggesting right atrial pressure of 15 mmHg. IAS/Shunts: No atrial level shunt detected by color flow Doppler. Additional Comments: A device lead is visualized in the right ventricle.  LEFT VENTRICLE PLAX 2D LVIDd:         6.10 cm LVIDs:         5.50 cm LV PW:         1.20 cm LV IVS:        1.20 cm LVOT diam:     2.10 cm LV SV:         50 LV SV Index:   20 LVOT Area:     3.46 cm  LV Volumes (MOD) LV vol d, MOD A2C: 211.0 ml LV vol d, MOD A4C: 201.0 ml LV vol s, MOD A2C: 155.0 ml LV vol s, MOD A4C: 158.0 ml LV SV MOD A2C:     56.0 ml LV SV MOD A4C:     201.0 ml LV SV MOD BP:      49.7 ml RIGHT VENTRICLE RV S prime:     8.78 cm/s TAPSE (M-mode): 1.9 cm LEFT ATRIUM              Index       RIGHT ATRIUM           Index LA diam:        4.70 cm  1.86 cm/m  RA Area:     15.00 cm LA Vol (A2C):   111.0 ml 43.99 ml/m RA Volume:   36.60 ml  14.50 ml/m LA Vol (A4C):   105.0 ml 41.61 ml/m LA Biplane Vol:  115.0 ml 45.57 ml/m  AORTIC VALVE LVOT Vmax:   109.00 cm/s LVOT Vmean:  62.280 cm/s LVOT VTI:    0.144 m  AORTA Ao Root diam: 3.00 cm Ao Asc diam:  2.60 cm MITRAL VALVE               TRICUSPID VALVE MV Area (PHT): 5.06 cm    TR Peak grad:   32.7 mmHg MV Decel Time: 150 msec    TR Vmax:        286.00 cm/s MV E velocity: 88.76 cm/s MV A velocity: 37.35 cm/s  SHUNTS MV E/A ratio:  2.38        Systemic VTI:  0.14 m                            Systemic Diam: 2.10 cm Mihai Croitoru MD Electronically signed by Thurmon Fair MD Signature Date/Time: 08/14/2020/4:46:36 PM    Final         Scheduled Meds:  alteplase  2 mg Intracatheter Once   amiodarone  200 mg Oral BID   apixaban  5 mg Oral BID   Chlorhexidine Gluconate Cloth  6 each Topical Daily   guaiFENesin  600 mg  Oral BID   insulin aspart  0-5 Units Subcutaneous QHS   insulin aspart  0-9 Units Subcutaneous TID WC   insulin aspart  2 Units Subcutaneous TID WC   mexiletine  300 mg Oral Q12H   midodrine  10 mg Oral TID WC   pantoprazole  40 mg Oral Daily   ranolazine  500 mg Oral BID   sodium chloride flush  10-40 mL Intracatheter Q12H   Continuous Infusions:  milrinone Stopped (08/15/20 0657)     LOS: 11 days   Time spent= 35 mins    Sherilee Smotherman Joline Maxcy, MD [facility]  If 7PM-7AM, please contact night-coverage  08/15/2020, 9:22 AM

## 2020-08-15 NOTE — Progress Notes (Addendum)
Patient ID: Roberto [name], male   DOB: April 19, 1965, 55 y.o.   MRN: [id number]     Advanced Heart Failure Rounding Note  PCP-Cardiologist: Arvilla Meres, MD   Subjective:     Remains on milrinoe 0.375 mcg. CO-OX 47%, 67% on recheck.   Creatinine continuing to trend up.   Midodrine increased yesterday d/t hypotension.  No output recorded overnight. RN reports multiple unmeasured voids. Weight stable.   No dyspnea at rest. Very fatigued and weak.   Objective:   Weight Range: (!) 148.6 kg Body mass index is 49.81 kg/m.   Vital Signs:   Temp:  [97.5 F (36.4 C)-98.2 F (36.8 C)] 97.6 F (36.4 C) (08/03 0430) Pulse Rate:  [86-89] 87 (08/03 0430) Resp:  [15-20] 20 (08/03 0430) BP: (82-114)/(39-81) 114/81 (08/03 0430) SpO2:  [91 %-95 %] 95 % (08/03 0430) Weight:  [148.6 kg] 148.6 kg (08/03 0430) Last BM Date: 08/14/20  Weight change: Filed Weights   08/13/20 0700 08/14/20 0359 08/15/20 0430  Weight: (!) 149.1 kg (!) 149 kg (!) 148.6 kg    Intake/Output:   Intake/Output Summary (Last 24 hours) at 08/15/2020 0730 Last data filed at 08/15/2020 0200 Gross per 24 hour  Intake 985.2 ml  Output 1075 ml  Net -89.8 ml      Physical Exam  CVP 7 General:  Sitting in the chair.  No resp difficulty HEENT: normal Neck: supple. JVP difficult to assess.  Carotids 2+ bilat; no bruits. No lymphadenopathy or thryomegaly appreciated. Cor: PMI nondisplaced. Regular rate & rhythm. No rubs, gallops or murmurs. Lungs: Diminished throughout Abdomen: obese, soft, nontender, nondistended. No hepatosplenomegaly. No bruits or masses. Good bowel sounds.+ RLE ileostomy  Extremities: no cyanosis, clubbing, rash, R and LLE 2+ edema. RUE PICC  Neuro: alert & orientedx3, cranial nerves grossly intact. moves all 4 extremities w/o difficulty. Affect pleasant  Telemetry   NSR 70s with variable PVC burden  Labs    CBC Recent Labs    08/14/20 0415 08/15/20 0550  WBC 14.7* 13.6*  NEUTROABS  11.3* 10.5*  HGB 13.3 13.3  HCT 39.9 40.4  MCV 90.9 91.6  PLT 285 292   Basic Metabolic Panel Recent Labs    91/63/84 0415 08/14/20 0954 08/15/20 0550  NA  --  129* 131*  K  --  3.6 4.4  CL  --  99 99  CO2  --  17* 17*  GLUCOSE  --  210* 131*  BUN  --  50* 52*  CREATININE  --  3.34* 3.55*  CALCIUM  --  8.8* 9.1  MG 2.0  --  2.0   Liver Function Tests No results for input(s): AST, ALT, ALKPHOS, BILITOT, PROT, ALBUMIN in the last 72 hours.  No results for input(s): LIPASE, AMYLASE in the last 72 hours. Cardiac Enzymes No results for input(s): CKTOTAL, CKMB, CKMBINDEX, TROPONINI in the last 72 hours.  BNP: BNP (last 3 results) Recent Labs    07/19/20 0222 07/23/20 1117 07/29/2020 1707  BNP 959.9* 992.0* 1,426.8*    ProBNP (last 3 results) No results for input(s): PROBNP in the last 8760 hours.   D-Dimer No results for input(s): DDIMER in the last 72 hours. Hemoglobin A1C Recent Labs    08/13/20 0232  HGBA1C 7.4*   Fasting Lipid Panel No results for input(s): CHOL, HDL, LDLCALC, TRIG, CHOLHDL, LDLDIRECT in the last 72 hours. Thyroid Function Tests No results for input(s): TSH, T4TOTAL, T3FREE, THYROIDAB in the last 72 hours.  Invalid input(s): FREET3  Other results:   Imaging    ECHOCARDIOGRAM COMPLETE  Result Date: 08/14/2020    ECHOCARDIOGRAM REPORT   Patient Name:   Roberto [name] Date of Exam: 08/14/2020 Medical Rec #:  [id number]      Height:       68.0 in Accession #:    [id number]     Weight:       328.5 lb Date of Birth:  07/20/1965      BSA:          2.524 m Patient Age:    89 years       BP:           97/78 mmHg Patient Gender: M              HR:           86 bpm. Exam Location:  Inpatient Procedure: 2D Echo, Cardiac Doppler and Color Doppler Indications:    CHF  History:        Patient has prior history of Echocardiogram examinations, most                 recent 12/14/2019. CHF and Cardiomyopathy; CAD.  Sonographer:    Bernadene Person RDCS Referring  Phys: [phone] AMY D CLEGG IMPRESSIONS  1. Left ventricular ejection fraction, by estimation, is 20%. The left ventricle has severely decreased function. The left ventricle demonstrates global hypokinesis. The left ventricular internal cavity size was moderately dilated. There is mild eccentric left ventricular hypertrophy. Left ventricular diastolic parameters are consistent with Grade III diastolic dysfunction (restrictive). Elevated left atrial pressure.  2. Right ventricular systolic function is mildly reduced. The right ventricular size is normal. There is moderately elevated pulmonary artery systolic pressure.  3. The mitral valve is normal in structure. Moderate mitral valve regurgitation. No evidence of mitral stenosis.  4. Tricuspid valve regurgitation is moderate.  5. The aortic valve is grossly normal. Aortic valve regurgitation is not visualized. No aortic stenosis is present.  6. The inferior vena cava is dilated in size with <50% respiratory variability, suggesting right atrial pressure of 15 mmHg. Comparison(s): Prior images reviewed side by side. The left ventricular function is worsened. The right ventricular systolic function is worse. FINDINGS  Left Ventricle: Left ventricular ejection fraction, by estimation, is 20%. The left ventricle has severely decreased function. The left ventricle demonstrates global hypokinesis. The left ventricular internal cavity size was moderately dilated. There is  mild eccentric left ventricular hypertrophy. Left ventricular diastolic parameters are consistent with Grade III diastolic dysfunction (restrictive). Elevated left atrial pressure. Right Ventricle: The right ventricular size is normal. No increase in right ventricular wall thickness. Right ventricular systolic function is mildly reduced. There is moderately elevated pulmonary artery systolic pressure. The tricuspid regurgitant velocity is 2.86 m/s, and with an assumed right atrial pressure of 15 mmHg, the  estimated right ventricular systolic pressure is 46.5 mmHg. Left Atrium: Left atrial size was normal in size. Right Atrium: Right atrial size was normal in size. Pericardium: There is no evidence of pericardial effusion. Mitral Valve: The mitral valve is normal in structure. Moderate mitral valve regurgitation, with eccentric posteriorly directed jet. No evidence of mitral valve stenosis. Tricuspid Valve: The tricuspid valve is normal in structure. Tricuspid valve regurgitation is moderate . No evidence of tricuspid stenosis. Aortic Valve: The aortic valve is grossly normal. Aortic valve regurgitation is not visualized. No aortic stenosis is present. Pulmonic Valve: The pulmonic valve was grossly normal. Pulmonic valve regurgitation is not visualized.  No evidence of pulmonic stenosis. Aorta: The aortic root is normal in size and structure. Venous: The inferior vena cava is dilated in size with less than 50% respiratory variability, suggesting right atrial pressure of 15 mmHg. IAS/Shunts: No atrial level shunt detected by color flow Doppler. Additional Comments: A device lead is visualized in the right ventricle.  LEFT VENTRICLE PLAX 2D LVIDd:         6.10 cm LVIDs:         5.50 cm LV PW:         1.20 cm LV IVS:        1.20 cm LVOT diam:     2.10 cm LV SV:         50 LV SV Index:   20 LVOT Area:     3.46 cm  LV Volumes (MOD) LV vol d, MOD A2C: 211.0 ml LV vol d, MOD A4C: 201.0 ml LV vol s, MOD A2C: 155.0 ml LV vol s, MOD A4C: 158.0 ml LV SV MOD A2C:     56.0 ml LV SV MOD A4C:     201.0 ml LV SV MOD BP:      49.7 ml RIGHT VENTRICLE RV S prime:     8.78 cm/s TAPSE (M-mode): 1.9 cm LEFT ATRIUM              Index       RIGHT ATRIUM           Index LA diam:        4.70 cm  1.86 cm/m  RA Area:     15.00 cm LA Vol (A2C):   111.0 ml 43.99 ml/m RA Volume:   36.60 ml  14.50 ml/m LA Vol (A4C):   105.0 ml 41.61 ml/m LA Biplane Vol: 115.0 ml 45.57 ml/m  AORTIC VALVE LVOT Vmax:   109.00 cm/s LVOT Vmean:  62.280 cm/s LVOT  VTI:    0.144 m  AORTA Ao Root diam: 3.00 cm Ao Asc diam:  2.60 cm MITRAL VALVE               TRICUSPID VALVE MV Area (PHT): 5.06 cm    TR Peak grad:   32.7 mmHg MV Decel Time: 150 msec    TR Vmax:        286.00 cm/s MV E velocity: 88.76 cm/s MV A velocity: 37.35 cm/s  SHUNTS MV E/A ratio:  2.38        Systemic VTI:  0.14 m                            Systemic Diam: 2.10 cm Mihai Croitoru MD Electronically signed by Sanda Klein MD Signature Date/Time: 08/14/2020/4:46:36 PM    Final      Medications:     Scheduled Medications:  alteplase  2 mg Intracatheter Once   apixaban  5 mg Oral BID   Chlorhexidine Gluconate Cloth  6 each Topical Daily   guaiFENesin  600 mg Oral BID   insulin aspart  0-5 Units Subcutaneous QHS   insulin aspart  0-9 Units Subcutaneous TID WC   insulin aspart  2 Units Subcutaneous TID WC   mexiletine  300 mg Oral Q12H   midodrine  10 mg Oral TID WC   pantoprazole  40 mg Oral Daily   ranolazine  500 mg Oral BID   sodium chloride flush  10-40 mL Intracatheter Q12H    Infusions:  milrinone 0.375 mcg/kg/min (  08/15/20 0600)    PRN Medications: acetaminophen **OR** acetaminophen, benzonatate, guaiFENesin-dextromethorphan, ondansetron (ZOFRAN) IV, sodium chloride flush   Assessment/Plan   1. Acute on chronic combined CHF:  -NICM.  Echo 12/21 with EF 25-30%, moderate LV dilation. Repeat echo this admit  with LVEF 20%, RV mild to moderately HK. Has medtronic ICD.  Patient sent from office visit 07/25/2020 with cellulitis, as well as acute on chronic combined CHF. Lactate elevated to 3.6. Suspect cellulitis with sepsis in setting of acute/chronic systolic CHF. Now end-stage.  -Diuretic stopped 7/28 due to worsening renal function, restarted 7/30 with rising CVP. -Co-ox 60% > 47% on 0.375 milrinone. Recheck 67%. -CVP 7. Does not appear significantly overloaded. Hold off on further diuretic. More edema after sitting with legs dependent.  -Unna boots reordered. Not applied  yesterday. -Medical therapy limited by hypotension and AKI on CKD.  -Continue midodrine 10 mg TID. BP improving. -Hold carvedilol and spironolactone in the setting of decompensated CHF and AKI. -Off digoxin and Farxiga with rising creatinine.   -Had discussion about GOC given recurrent HF exacerbations. Poor prognosis and struggling with end-stage HF + cardiorenal syndrome.  Not a candidate for advanced therapies given size and ileostomy. He wishes to remain full code for now. He has been discussing his wishes with family members. Has met with palliative care, not interested in seeing them again at this point. -Planning to establish with [facility] HF team after discharge. Moving to [facility] to live with brother.  2. RLE cellulitis:  - Severe sepsis. Patient injured his leg PTA. No evidence of osteomyelitis or abscess on CT.  - Completed antibiotics - Continue management per primary team  3. History of VT:  - Admission 05/2020 for VT with recurrence during admission, felt to be driving by electrolyte imbalance. Seen by EP and started on amiodarone and mexiletine with no evidence of recurrence on last interrogation 07/19/20. Patient denies ICD shocks - Very frequent PVCs and runs of NSVT on tele initially, improved.  - Amio gtt now off - variable PVC burden this am (avg < 5/min). Resume home po amio. - Continue mexiletine - Continue to monitor electrolytes closely and replete as needed to maintain K >4, Mg >2  4. History of LV thrombus:  - Continue Eliquis - No evidence of bleeding. Hgb stable.  5. Atrial flutter s/p ablation:  - In SR  - Continue eliquis.  - Restart po amiodarone for rhythm control  6. AKI on CKD stage 3b:  - Variable baseline creatinine 1.8-2.5.   - 2.7 on admit, down to 1.95. Now 2.0 > 2.27 > 2.6>>2.9>>3>>3.06>>3.34>>3.55 - Likely cardiorenal. - Hypotension may also be contributing.   7. Diverticulitis s/p ileostomy  8. Hyponatremia:  -Hypervolemic  hyponatremia -Improving -123>124>125>126>128>129>131   DISCHARGE PLANNING: -TOC following. Will need HH RN and PT. Assessing for 02.     Length of Stay: [place], LINDSAY N, PA-C  08/15/2020, 7:30 AM  Advanced Heart Failure Team Pager [phone] (M-F; 7a - 5p)  Please contact [facility] for night-coverage after hours (5p -7a ) and weekends on [email or web address]  Patient seen and examined with the above-signed Advanced Practice Provider and/or Housestaff. I personally reviewed laboratory data, imaging studies and relevant notes. I independently examined the patient and formulated the important aspects of the plan. I have edited the note to reflect any of my changes or salient points. I have personally discussed the plan with the patient and/or family.  Feels weak. Had some nausea overnight but better now. On  milrinone 0.375. Co-ox initially 47% but 67% on recheck. CVP 8   SBP improved on midodrine 10 tid.  SCr worse  General:  Weak appearing. No resp difficulty HEENT: normal Neck: supple. JVP 8 Carotids 2+ bilat; no bruits. No lymphadenopathy or thryomegaly appreciated. Cor: PMI nondisplaced. Regular rate & rhythm. No rubs, gallops or murmurs. Lungs: clear Abdomen: obese soft, nontender, nondistended. No hepatosplenomegaly. No bruits or masses. Good bowel sounds. + ileostomy Extremities: no cyanosis, clubbing, rash, 2+ edema Neuro: alert & orientedx3, cranial nerves grossly intact. moves all 4 extremities w/o difficulty. Affect pleasant  He remains very tenuous. Still with volume overload. Creatinine getting worse despite milrinone support and normal co-ox. Suspect may have component of ATN due to recent low BP.   EF worse on echo   We discussed option of proceeding with RHC to further assess versus watchful watching. He wants to continue watchful waiting for now.   Would place UNNA boots. Give one dose 80 IV lasix.  If BP drops will need to move to ICU for NE support.   Not  candidate for advanced HF therapies.    Glori Bickers, MD  10:04 AM

## 2020-08-15 NOTE — Progress Notes (Signed)
Mobility Specialist: Progress Note   08/15/20 1223  Mobility  Activity Refused mobility   Pt refused mobility stating he was too tired today. Will f/u as able.   [facility] Droll Mobility Specialist Mobility Specialist Phone: [phone]

## 2020-08-15 NOTE — Progress Notes (Signed)
PT Cancellation Note  Patient Details Name: Roberto [name] MRN: [id number] DOB: [birth year]   Cancelled Treatment:    Reason Eval/Treat Not Completed: Fatigue/lethargy limiting ability to participate. Attempted to see patient a couple of times today. Patient sleeping this attempt, family reports that he is very tired today and stated he did not want to walk. Will re-attempt tomorrow.    Landy Mace 08/15/2020, 3:19 PM

## 2020-08-15 NOTE — Progress Notes (Signed)
Orthopedic Tech Progress Note Patient Details:  Roberto [name] December 20, 1965 [id number]  Ortho Devices Type of Ortho Device: Roland Rack boot Ortho Device/Splint Location: Bi LE Ortho Device/Splint Interventions: Application   Post Interventions Patient Tolerated: Well  [name] E [name] 08/15/2020, 11:15 AM

## 2020-08-16 ENCOUNTER — Ambulatory Visit: Payer: BC Managed Care – PPO | Admitting: Family Medicine

## 2020-08-16 ENCOUNTER — Encounter (HOSPITAL_COMMUNITY): Admission: EM | Disposition: E | Payer: Self-pay | Source: Home / Self Care | Attending: Internal Medicine

## 2020-08-16 ENCOUNTER — Inpatient Hospital Stay (HOSPITAL_COMMUNITY): Payer: BC Managed Care – PPO

## 2020-08-16 DIAGNOSIS — I5021 Acute systolic (congestive) heart failure: Secondary | ICD-10-CM

## 2020-08-16 DIAGNOSIS — L03115 Cellulitis of right lower limb: Secondary | ICD-10-CM | POA: Diagnosis not present

## 2020-08-16 DIAGNOSIS — N1832 Type 2 diabetes mellitus with diabetic chronic kidney disease: Secondary | ICD-10-CM | POA: Insufficient documentation

## 2020-08-16 DIAGNOSIS — N179 Acute kidney failure, unspecified: Secondary | ICD-10-CM | POA: Diagnosis not present

## 2020-08-16 DIAGNOSIS — I5023 Acute on chronic systolic (congestive) heart failure: Secondary | ICD-10-CM

## 2020-08-16 DIAGNOSIS — I5043 Acute on chronic combined systolic (congestive) and diastolic (congestive) heart failure: Secondary | ICD-10-CM | POA: Diagnosis not present

## 2020-08-16 HISTORY — PX: RIGHT HEART CATH: CATH118263

## 2020-08-16 LAB — CBC WITH DIFFERENTIAL/PLATELET
Abs Immature Granulocytes: 0.73 10*3/uL — ABNORMAL HIGH (ref 0.00–0.07)
Basophils Absolute: 0.2 10*3/uL — ABNORMAL HIGH (ref 0.0–0.1)
Basophils Relative: 1 %
Eosinophils Absolute: 0.2 10*3/uL (ref 0.0–0.5)
Eosinophils Relative: 2 %
HCT: 42.6 % (ref 39.0–52.0)
Hemoglobin: 13.5 g/dL (ref 13.0–17.0)
Immature Granulocytes: 5 %
Lymphocytes Relative: 7 %
Lymphs Abs: 1.1 10*3/uL (ref 0.7–4.0)
MCH: 29.3 pg (ref 26.0–34.0)
MCHC: 31.7 g/dL (ref 30.0–36.0)
MCV: 92.6 fL (ref 80.0–100.0)
Monocytes Absolute: 1.5 10*3/uL — ABNORMAL HIGH (ref 0.1–1.0)
Monocytes Relative: 10 %
Neutro Abs: 11.4 10*3/uL — ABNORMAL HIGH (ref 1.7–7.7)
Neutrophils Relative %: 75 %
Platelets: 351 10*3/uL (ref 150–400)
RBC: 4.6 MIL/uL (ref 4.22–5.81)
RDW: 15.7 % — ABNORMAL HIGH (ref 11.5–15.5)
WBC: 15.1 10*3/uL — ABNORMAL HIGH (ref 4.0–10.5)
nRBC: 0.5 % — ABNORMAL HIGH (ref 0.0–0.2)

## 2020-08-16 LAB — POCT I-STAT 7, (LYTES, BLD GAS, ICA,H+H)
Acid-base deficit: 8 mmol/L — ABNORMAL HIGH (ref 0.0–2.0)
Acid-base deficit: 9 mmol/L — ABNORMAL HIGH (ref 0.0–2.0)
Bicarbonate: 17.3 mmol/L — ABNORMAL LOW (ref 20.0–28.0)
Bicarbonate: 17.3 mmol/L — ABNORMAL LOW (ref 20.0–28.0)
Calcium, Ion: 1.21 mmol/L (ref 1.15–1.40)
Calcium, Ion: 1.23 mmol/L (ref 1.15–1.40)
HCT: 44 % (ref 39.0–52.0)
HCT: 44 % (ref 39.0–52.0)
Hemoglobin: 15 g/dL (ref 13.0–17.0)
Hemoglobin: 15 g/dL (ref 13.0–17.0)
O2 Saturation: 42 %
O2 Saturation: 100 %
Patient temperature: 36.3
Potassium: 4.5 mmol/L (ref 3.5–5.1)
Potassium: 4.3 mmol/L (ref 3.5–5.1)
Sodium: 133 mmol/L — ABNORMAL LOW (ref 135–145)
Sodium: 132 mmol/L — ABNORMAL LOW (ref 135–145)
TCO2: 18 mmol/L — ABNORMAL LOW (ref 22–32)
TCO2: 18 mmol/L — ABNORMAL LOW (ref 22–32)
pCO2 arterial: 33.8 mmHg (ref 32.0–48.0)
pCO2 arterial: 36.4 mmHg (ref 32.0–48.0)
pH, Arterial: 7.318 — ABNORMAL LOW (ref 7.350–7.450)
pH, Arterial: 7.282 — ABNORMAL LOW (ref 7.350–7.450)
pO2, Arterial: 25 mmHg — CL (ref 83.0–108.0)
pO2, Arterial: 337 mmHg — ABNORMAL HIGH (ref 83.0–108.0)

## 2020-08-16 LAB — BASIC METABOLIC PANEL
Anion gap: 13 (ref 5–15)
Anion gap: 12 (ref 5–15)
BUN: 52 mg/dL — ABNORMAL HIGH (ref 6–20)
BUN: 55 mg/dL — ABNORMAL HIGH (ref 6–20)
CO2: 15 mmol/L — ABNORMAL LOW (ref 22–32)
CO2: 15 mmol/L — ABNORMAL LOW (ref 22–32)
Calcium: 9 mg/dL (ref 8.9–10.3)
Calcium: 8.9 mg/dL (ref 8.9–10.3)
Chloride: 101 mmol/L (ref 98–111)
Chloride: 102 mmol/L (ref 98–111)
Creatinine, Ser: 3.76 mg/dL — ABNORMAL HIGH (ref 0.61–1.24)
Creatinine, Ser: 3.74 mg/dL — ABNORMAL HIGH (ref 0.61–1.24)
GFR, Estimated: 18 mL/min — ABNORMAL LOW (ref >60)
GFR, Estimated: 18 mL/min — ABNORMAL LOW (ref >60)
Glucose, Bld: 152 mg/dL — ABNORMAL HIGH (ref 70–99)
Glucose, Bld: 165 mg/dL — ABNORMAL HIGH (ref 70–99)
Potassium: 4.2 mmol/L (ref 3.5–5.1)
Potassium: 4.2 mmol/L (ref 3.5–5.1)
Sodium: 129 mmol/L — ABNORMAL LOW (ref 135–145)
Sodium: 129 mmol/L — ABNORMAL LOW (ref 135–145)

## 2020-08-16 LAB — POCT I-STAT EG7
Acid-base deficit: 8 mmol/L — ABNORMAL HIGH (ref 0.0–2.0)
Bicarbonate: 17.5 mmol/L — ABNORMAL LOW (ref 20.0–28.0)
Calcium, Ion: 1.22 mmol/L (ref 1.15–1.40)
HCT: 44 % (ref 39.0–52.0)
Hemoglobin: 15 g/dL (ref 13.0–17.0)
O2 Saturation: 43 %
Potassium: 4.4 mmol/L (ref 3.5–5.1)
Sodium: 133 mmol/L — ABNORMAL LOW (ref 135–145)
TCO2: 18 mmol/L — ABNORMAL LOW (ref 22–32)
pCO2, Ven: 33.8 mmHg — ABNORMAL LOW (ref 44.0–60.0)
pH, Ven: 7.321 (ref 7.250–7.430)
pO2, Ven: 26 mmHg — CL (ref 32.0–45.0)

## 2020-08-16 LAB — URINALYSIS, ROUTINE W REFLEX MICROSCOPIC
Bacteria, UA: NONE SEEN
Bilirubin Urine: NEGATIVE
Glucose, UA: NEGATIVE mg/dL
Hgb urine dipstick: NEGATIVE
Ketones, ur: NEGATIVE mg/dL
Leukocytes,Ua: NEGATIVE
Nitrite: NEGATIVE
Protein, ur: 100 mg/dL — AB
Specific Gravity, Urine: 1.014 (ref 1.005–1.030)
pH: 5 (ref 5.0–8.0)

## 2020-08-16 LAB — GLUCOSE, CAPILLARY
Glucose-Capillary: 161 mg/dL — ABNORMAL HIGH (ref 70–99)
Glucose-Capillary: 154 mg/dL — ABNORMAL HIGH (ref 70–99)
Glucose-Capillary: 169 mg/dL — ABNORMAL HIGH (ref 70–99)

## 2020-08-16 LAB — CBC
HCT: 41 % (ref 39.0–52.0)
Hemoglobin: 13.2 g/dL (ref 13.0–17.0)
MCH: 29.7 pg (ref 26.0–34.0)
MCHC: 32.2 g/dL (ref 30.0–36.0)
MCV: 92.1 fL (ref 80.0–100.0)
Platelets: 361 10*3/uL (ref 150–400)
RBC: 4.45 MIL/uL (ref 4.22–5.81)
RDW: 15.7 % — ABNORMAL HIGH (ref 11.5–15.5)
WBC: 16.7 10*3/uL — ABNORMAL HIGH (ref 4.0–10.5)
nRBC: 0.5 % — ABNORMAL HIGH (ref 0.0–0.2)

## 2020-08-16 LAB — PROCALCITONIN: Procalcitonin: 0.6 ng/mL

## 2020-08-16 LAB — COOXEMETRY PANEL
Carboxyhemoglobin: 1.7 % — ABNORMAL HIGH (ref 0.5–1.5)
Methemoglobin: 0.7 % (ref 0.0–1.5)
O2 Saturation: 51.9 %
Total hemoglobin: 12.5 g/dL (ref 12.0–16.0)

## 2020-08-16 LAB — MAGNESIUM
Magnesium: 1.8 mg/dL (ref 1.7–2.4)
Magnesium: 2.4 mg/dL (ref 1.7–2.4)

## 2020-08-16 SURGERY — RIGHT HEART CATH
Anesthesia: LOCAL

## 2020-08-16 MED ORDER — LIDOCAINE HCL (PF) 1 % IJ SOLN
INTRAMUSCULAR | Status: DC | PRN
  Administered 2020-08-16: 5 mL

## 2020-08-16 MED ORDER — MIDAZOLAM HCL 2 MG/2ML IJ SOLN
4.0000 mg | Freq: Once | INTRAMUSCULAR | Status: AC
  Administered 2020-08-16: 4 mg via INTRAVENOUS

## 2020-08-16 MED ORDER — DOCUSATE SODIUM 50 MG/5ML PO LIQD
100.0000 mg | Freq: Two times a day (BID) | ORAL | Status: DC
Start: 2020-08-16 — End: 2020-08-19
  Administered 2020-08-16 – 2020-08-18 (×3): 100 mg
  Filled 2020-08-16 (×3): qty 10

## 2020-08-16 MED ORDER — HEPARIN (PORCINE) IN NACL 1000-0.9 UT/500ML-% IV SOLN
INTRAVENOUS | Status: AC
  Filled 2020-08-16: qty 1000

## 2020-08-16 MED ORDER — NOREPINEPHRINE 4 MG/250ML-% IV SOLN
INTRAVENOUS | Status: AC
  Filled 2020-08-16: qty 250

## 2020-08-16 MED ORDER — MEXILETINE HCL 150 MG PO CAPS
300.0000 mg | ORAL_CAPSULE | Freq: Two times a day (BID) | ORAL | Status: DC
  Administered 2020-08-16 – 2020-08-18 (×4): 300 mg
  Filled 2020-08-16 (×6): qty 2

## 2020-08-16 MED ORDER — PIPERACILLIN-TAZOBACTAM 3.375 G IVPB 30 MIN
3.3750 g | Freq: Once | INTRAVENOUS | Status: AC
  Administered 2020-08-16: 3.375 g via INTRAVENOUS
  Filled 2020-08-16 (×2): qty 50

## 2020-08-16 MED ORDER — FENTANYL BOLUS VIA INFUSION
50.0000 ug | INTRAVENOUS | Status: DC | PRN
  Administered 2020-08-17 (×2): 50 ug via INTRAVENOUS
  Administered 2020-08-17: 100 ug via INTRAVENOUS
  Administered 2020-08-17 (×2): 50 ug via INTRAVENOUS
  Administered 2020-08-18 – 2020-08-19 (×3): 100 ug via INTRAVENOUS
  Filled 2020-08-16: qty 100

## 2020-08-16 MED ORDER — HEPARIN (PORCINE) IN NACL 1000-0.9 UT/500ML-% IV SOLN
INTRAVENOUS | Status: DC | PRN
  Administered 2020-08-16: 500 mL

## 2020-08-16 MED ORDER — FUROSEMIDE 10 MG/ML IJ SOLN
30.0000 mg/h | INTRAVENOUS | Status: DC
  Administered 2020-08-16 – 2020-08-17 (×3): 30 mg/h via INTRAVENOUS
  Filled 2020-08-16 (×5): qty 20

## 2020-08-16 MED ORDER — FUROSEMIDE 10 MG/ML IJ SOLN: 80.0000 mg | Freq: Once | INTRAMUSCULAR | Status: DC

## 2020-08-16 MED ORDER — POLYETHYLENE GLYCOL 3350 17 G PO PACK
17.0000 g | PACK | Freq: Every day | ORAL | Status: DC
Start: 2020-08-16 — End: 2020-08-19
  Administered 2020-08-18: 17 g
  Filled 2020-08-16: qty 1

## 2020-08-16 MED ORDER — PROPOFOL 1000 MG/100ML IV EMUL
0.0000 ug/kg/min | INTRAVENOUS | Status: DC
Start: 2020-08-16 — End: 2020-08-18
  Administered 2020-08-16 – 2020-08-17 (×2): 10 ug/kg/min via INTRAVENOUS
  Administered 2020-08-17 (×2): 30 ug/kg/min via INTRAVENOUS
  Administered 2020-08-17: 20 ug/kg/min via INTRAVENOUS
  Administered 2020-08-17 – 2020-08-18 (×2): 10 ug/kg/min via INTRAVENOUS
  Filled 2020-08-16 (×7): qty 100

## 2020-08-16 MED ORDER — MAGNESIUM SULFATE 2 GM/50ML IV SOLN
2.0000 g | Freq: Once | INTRAVENOUS | Status: AC
  Administered 2020-08-16: 2 g via INTRAVENOUS
  Filled 2020-08-16: qty 50

## 2020-08-16 MED ORDER — FUROSEMIDE 10 MG/ML IJ SOLN
120.0000 mg | Freq: Once | INTRAVENOUS | Status: AC
  Administered 2020-08-16: 120 mg via INTRAVENOUS
  Filled 2020-08-16: qty 12

## 2020-08-16 MED ORDER — ETOMIDATE 2 MG/ML IV SOLN
20.0000 mg | Freq: Once | INTRAVENOUS | Status: AC
  Administered 2020-08-16: 20 mg via INTRAVENOUS

## 2020-08-16 MED ORDER — METOLAZONE 5 MG PO TABS
5.0000 mg | ORAL_TABLET | Freq: Once | ORAL | Status: AC
  Administered 2020-08-16: 5 mg via ORAL
  Filled 2020-08-16: qty 1

## 2020-08-16 MED ORDER — SODIUM CHLORIDE 0.9 % IV SOLN
INTRAVENOUS | Status: DC | PRN
  Administered 2020-08-16: 250 mL via INTRAVENOUS

## 2020-08-16 MED ORDER — FENTANYL CITRATE (PF) 100 MCG/2ML IJ SOLN
200.0000 ug | Freq: Once | INTRAMUSCULAR | Status: AC
  Administered 2020-08-16: 200 ug via INTRAVENOUS

## 2020-08-16 MED ORDER — PIPERACILLIN-TAZOBACTAM 3.375 G IVPB
3.3750 g | Freq: Three times a day (TID) | INTRAVENOUS | Status: DC
  Administered 2020-08-17 – 2020-08-18 (×5): 3.375 g via INTRAVENOUS
  Filled 2020-08-16 (×5): qty 50

## 2020-08-16 MED ORDER — FENTANYL CITRATE (PF) 100 MCG/2ML IJ SOLN: 50.0000 ug | Freq: Once | INTRAMUSCULAR | Status: DC

## 2020-08-16 MED ORDER — LIDOCAINE HCL (PF) 1 % IJ SOLN
INTRAMUSCULAR | Status: AC
  Filled 2020-08-16: qty 30

## 2020-08-16 MED ORDER — ROCURONIUM BROMIDE 50 MG/5ML IV SOLN
100.0000 mg | Freq: Once | INTRAVENOUS | Status: AC
  Administered 2020-08-16: 100 mg via INTRAVENOUS
  Filled 2020-08-16: qty 10

## 2020-08-16 MED ORDER — ORAL CARE MOUTH RINSE
15.0000 mL | OROMUCOSAL | Status: DC
  Administered 2020-08-16 – 2020-08-20 (×37): 15 mL via OROMUCOSAL

## 2020-08-16 MED ORDER — INSULIN ASPART 100 UNIT/ML IJ SOLN
0.0000 [IU] | INTRAMUSCULAR | Status: DC
  Administered 2020-08-16: 3 [IU] via SUBCUTANEOUS
  Administered 2020-08-17: 2 [IU] via SUBCUTANEOUS
  Administered 2020-08-17 (×2): 3 [IU] via SUBCUTANEOUS
  Administered 2020-08-17 (×2): 2 [IU] via SUBCUTANEOUS
  Administered 2020-08-17 – 2020-08-19 (×10): 3 [IU] via SUBCUTANEOUS
  Administered 2020-08-19: 2 [IU] via SUBCUTANEOUS
  Administered 2020-08-19: 3 [IU] via SUBCUTANEOUS
  Administered 2020-08-19 – 2020-08-20 (×2): 2 [IU] via SUBCUTANEOUS
  Administered 2020-08-20 (×2): 3 [IU] via SUBCUTANEOUS
  Administered 2020-08-20: 2 [IU] via SUBCUTANEOUS

## 2020-08-16 MED ORDER — PIPERACILLIN-TAZOBACTAM 3.375 G IVPB: 3.3750 g | Freq: Two times a day (BID) | INTRAVENOUS | Status: DC

## 2020-08-16 MED ORDER — CHLORHEXIDINE GLUCONATE 0.12% ORAL RINSE (MEDLINE KIT)
15.0000 mL | Freq: Two times a day (BID) | OROMUCOSAL | Status: DC
  Administered 2020-08-16 – 2020-08-20 (×8): 15 mL via OROMUCOSAL

## 2020-08-16 MED ORDER — SODIUM CHLORIDE 0.9% FLUSH: 10.0000 mL | INTRAVENOUS | Status: DC | PRN

## 2020-08-16 MED ORDER — SODIUM CHLORIDE 0.9% FLUSH
10.0000 mL | Freq: Two times a day (BID) | INTRAVENOUS | Status: DC
Start: 2020-08-16 — End: 2020-08-20
  Administered 2020-08-16 – 2020-08-20 (×5): 10 mL

## 2020-08-16 MED ORDER — HEPARIN (PORCINE) 25000 UT/250ML-% IV SOLN
2450.0000 [IU]/h | INTRAVENOUS | Status: DC
  Administered 2020-08-16: 2000 [IU]/h via INTRAVENOUS
  Administered 2020-08-17: 2450 [IU]/h via INTRAVENOUS
  Administered 2020-08-17: 2300 [IU]/h via INTRAVENOUS
  Administered 2020-08-18 – 2020-08-20 (×4): 2450 [IU]/h via INTRAVENOUS
  Filled 2020-08-16 (×8): qty 250

## 2020-08-16 MED ORDER — FENTANYL 2500MCG IN NS 250ML (10MCG/ML) PREMIX INFUSION
50.0000 ug/h | INTRAVENOUS | Status: DC
  Administered 2020-08-16: 50 ug/h via INTRAVENOUS
  Administered 2020-08-17: 150 ug/h via INTRAVENOUS
  Administered 2020-08-18: 175 ug/h via INTRAVENOUS
  Administered 2020-08-19: 350 ug/h via INTRAVENOUS
  Administered 2020-08-19: 175 ug/h via INTRAVENOUS
  Administered 2020-08-19: 300 ug/h via INTRAVENOUS
  Administered 2020-08-20 (×2): 350 ug/h via INTRAVENOUS
  Filled 2020-08-16 (×9): qty 250

## 2020-08-16 MED ORDER — NOREPINEPHRINE 4 MG/250ML-% IV SOLN
0.0000 ug/min | INTRAVENOUS | Status: DC
Start: 2020-08-16 — End: 2020-08-17
  Administered 2020-08-17 (×3): 14 ug/min via INTRAVENOUS
  Filled 2020-08-16 (×3): qty 250

## 2020-08-16 SURGICAL SUPPLY — 9 items
CATH SWAN GANZ VIP 7.5F (CATHETERS) ×1 IMPLANT
PACK CARDIAC CATHETERIZATION (CUSTOM PROCEDURE TRAY) ×2 IMPLANT
PROTECTION STATION PRESSURIZED (MISCELLANEOUS) ×2
SET INTRODUCER MICROPUNCT 5F (INTRODUCER) ×1 IMPLANT
SHEATH PINNACLE 8F 10CM (SHEATH) ×1 IMPLANT
SHEATH PROBE COVER 6X72 (BAG) ×1 IMPLANT
SLEEVE REPOSITIONING LENGTH 30 (MISCELLANEOUS) ×1 IMPLANT
STATION PROTECTION PRESSURIZED (MISCELLANEOUS) IMPLANT
TRANSDUCER W/STOPCOCK (MISCELLANEOUS) ×2 IMPLANT

## 2020-08-16 NOTE — Progress Notes (Signed)
RSI kit removed emergently from [place] while patient arriving, prompting this RN to add patient as a temporary patient in the Pyxis and override for the kit. 1 vial of etomidate and 1 syringe of ketamine unused and unopened. The Pyxis will not allow a return of these medications. They were handed over to PharmD for them to return to the pharmacy downstairs.

## 2020-08-16 NOTE — Progress Notes (Signed)
eLink Physician-Brief Progress Note Patient Name: Roberto [name] DOB: [birth year] MRN: [id number]   Date of Service  2020-08-23  HPI/Events of Note  ABG on 100%/PRVC 22/TV 550/P 12 = 7.28/36.4/337/17.3.  eICU Interventions  Plan: Decrease FiO2 to 60% and increase RR to 28. Repeat ABG at 5 AM.     Intervention Category Major Interventions: Acid-Base disturbance - evaluation and management;Respiratory failure - evaluation and management  Lenell Antu 2020/08/23, 9:40 PM

## 2020-08-16 NOTE — Progress Notes (Deleted)
  Subjective:    Patient ID: Roberto [name], male    DOB: [birth year], 55 y.o.   MRN: [id number]  Roberto [name] is a 55 y.o. male who presents for follow-up of Type 2 diabetes mellitus.  Home blood sugar records: {diabetes glucometry results:16657} Current symptoms/problems include {Symptoms; diabetes:14075} and have been {Desc; course:15616}. Daily foot checks:   Any foot concerns: *** Exercise: {types:19826} Diet: The following portions of the patient's history were reviewed and updated as appropriate: allergies, current medications, past medical history, past social history and problem list.  ROS as in subjective above.     Objective:    Physical Exam Alert and in no distress otherwise not examined.  There were no vitals taken for this visit.  Lab Review Diabetic Labs Latest Ref Rng & Units 08/31/2020 08/15/2020 08/15/2020 08/14/2020 08/13/2020  HbA1c 4.8 - 5.6 % - - - - 7.4(H)  Chol 125 - 200 mg/dL - - - - -  HDL >=48 mg/dL - - - - -  Calc LDL <889 mg/dL - - - - -  Triglycerides <150 mg/dL - - - - -  Creatinine 1.69 - 1.24 mg/dL 4.50(T) 8.88(K) 8.00(L) 3.34(H) 3.06(H)  GFR >60.00 mL/min - - - - -   BP/Weight 08/17/2020 August 09, 2020 2020/08/09 Aug 09, 2020 07/23/2020  Systolic BP 127 - 98 - 113  Diastolic BP 57 - 59 - 80  Wt. (Lbs) 327.7 - 339.6 - -  BMI - 49.83 - 51.64 -   No flowsheet data found.  Tel  reports that he has never smoked. He has never used smokeless tobacco. He reports previous alcohol use. He reports that he does not use drugs.     Assessment & Plan:    No diagnosis found.  Rx changes: {none:33079} Education: Reviewed 'ABCs' of diabetes management (respective goals in parentheses):  A1C (<7), blood pressure (<130/80), and cholesterol (LDL <100). Compliance at present is estimated to be {good/fair/poor:33178}. Efforts to improve compliance (if necessary) will be directed at {compliance:16716}. Follow up: {NUMBERS; 0-10:33138} {time:11}

## 2020-08-16 NOTE — Progress Notes (Addendum)
Patient ID: Roberto [name], male   DOB: 1965-05-06, 55 y.o.   MRN: [id number]     Advanced Heart Failure Rounding Note  PCP-Cardiologist: Glori Bickers, MD   Subjective:     Remains on milrinoe 0.375 mcg. Co-ox 52%. CVP 13  Creatinine continuing to trend up.   BP improved with midodrine.  Weight stable. Multiple unmeasured outputs from ileostomy. A little more shortness of breath today. Extremely weak. Hasn't had enough energy to participate in PT.     Objective:   Weight Range: (!) 148.6 kg Body mass index is 49.83 kg/m.   Vital Signs:   Temp:  [97.5 F (36.4 C)-98.2 F (36.8 C)] 98 F (36.7 C) (08/04 0729) Pulse Rate:  [87-97] 97 (08/04 0729) Resp:  [18-23] 23 (08/04 0729) BP: (92-127)/(43-78) 127/57 (08/04 0729) SpO2:  [91 %-97 %] 93 % (08/04 0729) Weight:  [148.6 kg] 148.6 kg (08/04 0405) Last BM Date: 08/14/20  Weight change: Filed Weights   08/14/20 0359 08/15/20 0430 08/23/2020 0405  Weight: (!) 149 kg (!) 148.6 kg (!) 148.6 kg    Intake/Output:   Intake/Output Summary (Last 24 hours) at 08/23/2020 0947 Last data filed at 08/15/2020 2300 Gross per 24 hour  Intake 793.09 ml  Output 930 ml  Net -136.91 ml      Physical Exam  CVP 13 General:  Sitting in the chair.  No resp difficulty HEENT: normal Neck: supple. JVP 13cm.  Carotids 2+ bilat; no bruits. No lymphadenopathy or thryomegaly appreciated. Cor: PMI nondisplaced. Regular rate & rhythm. No rubs, gallops or murmurs. Lungs: Diminished throughout Abdomen: obese, soft, nontender, nondistended. No hepatosplenomegaly. No bruits or masses. Good bowel sounds.+ ileostomy  Extremities: no cyanosis, clubbing, rash, 2+ bilateral LE edema, ACE wraps on, RUE PICC  Neuro: alert & orientedx3, cranial nerves grossly intact. moves all 4 extremities w/o difficulty. Affect pleasant  Telemetry   NSR 90s, less than 5 PVCs/min  Labs    CBC Recent Labs    08/15/20 0550 08/15/20 1051 08/28/2020 0646  WBC 13.6*  14.3* 15.1*  NEUTROABS 10.5*  --  11.4*  HGB 13.3 13.5 13.5  HCT 40.4 41.5 42.6  MCV 91.6 91.6 92.6  PLT 292 329 500   Basic Metabolic Panel Recent Labs    08/15/20 1051 08/23/2020 0646  NA 129* 129*  K 4.0 4.2  CL 102 101  CO2 17* 15*  GLUCOSE 141* 152*  BUN 51* 52*  CREATININE 3.45* 3.76*  CALCIUM 8.8* 9.0  MG 2.0 1.8   Liver Function Tests No results for input(s): AST, ALT, ALKPHOS, BILITOT, PROT, ALBUMIN in the last 72 hours.  No results for input(s): LIPASE, AMYLASE in the last 72 hours. Cardiac Enzymes No results for input(s): CKTOTAL, CKMB, CKMBINDEX, TROPONINI in the last 72 hours.  BNP: BNP (last 3 results) Recent Labs    07/19/20 0222 07/23/20 1117 08/11/2020 1707  BNP 959.9* 992.0* 1,426.8*    ProBNP (last 3 results) No results for input(s): PROBNP in the last 8760 hours.   D-Dimer No results for input(s): DDIMER in the last 72 hours. Hemoglobin A1C No results for input(s): HGBA1C in the last 72 hours.  Fasting Lipid Panel No results for input(s): CHOL, HDL, LDLCALC, TRIG, CHOLHDL, LDLDIRECT in the last 72 hours. Thyroid Function Tests No results for input(s): TSH, T4TOTAL, T3FREE, THYROIDAB in the last 72 hours.  Invalid input(s): FREET3  Other results:   Imaging    No results found.   Medications:     Scheduled  Medications:  alteplase  2 mg Intracatheter Once   amiodarone  200 mg Oral BID   apixaban  5 mg Oral BID   Chlorhexidine Gluconate Cloth  6 each Topical Daily   guaiFENesin  600 mg Oral BID   insulin aspart  0-5 Units Subcutaneous QHS   insulin aspart  0-9 Units Subcutaneous TID WC   insulin aspart  2 Units Subcutaneous TID WC   mexiletine  300 mg Oral Q12H   midodrine  10 mg Oral TID WC   pantoprazole  40 mg Oral Daily   ranolazine  500 mg Oral BID   sodium chloride flush  10-40 mL Intracatheter Q12H    Infusions:  milrinone 0.375 mcg/kg/min (08/15/20 1930)    PRN Medications: acetaminophen **OR** acetaminophen,  benzonatate, guaiFENesin-dextromethorphan, ondansetron (ZOFRAN) IV, sodium chloride flush, traZODone   Assessment/Plan   1. Acute on chronic combined CHF:  -NICM.  Echo 12/21 with EF 25-30%, moderate LV dilation. Repeat echo this admit  with LVEF 20%, RV mild to moderately HK. Has medtronic ICD.  Patient sent from office visit 07/17/2020 with cellulitis, as well as acute on chronic combined CHF. Lactate elevated to 3.6. Suspect cellulitis with sepsis in setting of acute/chronic systolic CHF. Now end-stage.  -Diuretic stopped 7/28 due to worsening renal function, restarted 7/30 with rising CVP. -On 0.375 milrinone. Co-ox 52%. CO2  15. -CVP 13. Weight stable. Continues to have significant edema and appears more dyspneic. Will give 120 mg lasix IV once and 5 mg metolazone. If not diuresing over next couple of hours, will arrange for RHC this afternoon. Concern for low output HF. -Medical therapy limited by hypotension and AKI on CKD.  -Continue midodrine 10 mg TID. BP improving. -Hold carvedilol and spironolactone in the setting of decompensated CHF and AKI. -Off digoxin and Farxiga with rising creatinine.   -Had discussion about GOC given recurrent HF exacerbations. Poor prognosis and struggling with end-stage HF + cardiorenal syndrome.  Not a candidate for advanced therapies given size and ileostomy. He wishes to remain full code for now. He has been discussing his wishes with family members. Has met with palliative care, not interested in seeing them again at this point. -Planning to establish with [facility] HF team after discharge. Moving to [facility] to live with brother.  2. RLE cellulitis:  - Severe sepsis. Patient injured his leg PTA. No evidence of osteomyelitis or abscess on CT.  - Completed antibiotics - Has persistent leukocytosis, WBC 14-15 - Continue management per primary team  3. History of VT:  - Admission 05/2020 for VT with recurrence during admission, felt to be driving by  electrolyte imbalance. Seen by EP and started on amiodarone and mexiletine with no evidence of recurrence on last interrogation 07/19/20. Patient denies ICD shocks - Very frequent PVCs and runs of NSVT on tele initially, improved.  - Amio gtt now off, < 5 PVCs/min . Continue po amio. - Continue mexiletine - Continue to monitor electrolytes closely and replete as needed to maintain K >4, Mg >2  4. History of LV thrombus:  - Continue Eliquis - No evidence of bleeding. Hgb stable.  5. Atrial flutter s/p ablation:  - In SR  - Continue eliquis.  - Po amiodarone for rhythm control  6. AKI on CKD stage 3b:  - Variable baseline creatinine 1.8-2.5.   - 2.7 on admit, down to 1.95. Now 2.0 > 2.27 > 2.6>>2.9>>3>>3.06>>3.34>>3.55>>3.76 - Continue daily BMP - Likely cardiorenal. - Suspect component of ATN from low BP.  7. Diverticulitis s/p ileostomy  8. Hyponatremia:  -Hypervolemic hyponatremia -Improving -123>124>125>126>128>129>131>129>129   DISCHARGE PLANNING: -TOC following. Will need HH RN and PT. Assessing for 02.     Length of Stay: [address] Lynder Parents, PA-C  08/31/2020, 9:47 AM  Advanced Heart Failure Team Pager [phone] (M-F; 7a - 5p)  Please contact [facility] for night-coverage after hours (5p -7a ) and weekends on [email or web address]   Patient seen and examined with the above-signed Advanced Practice Provider and/or Housestaff. I personally reviewed laboratory data, imaging studies and relevant notes. I independently examined the patient and formulated the important aspects of the plan. I have edited the note to reflect any of my changes or salient points. I have personally discussed the plan with the patient and/or family.  More sob today. CVP 14. Co-ox marginal on milrinone 0.375. Only mild response to IV lasix.   General:  Sitting in chair. SOB at rest  HEENT: normal Neck: supple. JVP to jaw  Carotids 2+ bilat; no bruits. No lymphadenopathy or thryomegaly  appreciated. Cor: PMI nondisplaced. Regular rate & rhythm. No rubs, gallops or murmurs. Lungs: + crackles Abdomen: Obese soft, nontender, nondistended. No hepatosplenomegaly. No bruits or masses. Good bowel sounds. + ileus  Extremities: no cyanosis, clubbing, rash, 2-3+ edema Neuro: alert & orientedx3, cranial nerves grossly intact. moves all 4 extremities w/o difficulty. Affect pleasant  He is more tenuous today with worsening HF and renal function despite milrinone. Will plan RHC today to further evaluate. May need to move to ICU.   Glori Bickers, MD  5:00 PM

## 2020-08-16 NOTE — Progress Notes (Signed)
PROGRESS NOTE    Roberto [name]  WFU:[id number] DOB: [birth year] DOA: 07/30/2020 PCP: [name], MD   Brief Narrative:  55 y.o. male past medical history of chronic systolic heart failure with a 2D echo in December 2021 that showed an EF of 25% grade 3 diastolic dysfunction recently admitted and discharged on 07/21/2020 for systolic heart failure, he noticed some right lower extremity redness and some discharge from the wound in the ER was found to be with cardiogenic shock and concerns for right lower extremity cellulitis, with imaging concerning for heart failure.  He was also found to be septic due to right lower extremity cellulitis and he will complete a 14-day course has remained afebrile leukocytosis persistently elevated. BNP of 1400 creatinine of 2.7 white blood cell count of 15.  The advanced heart failure team has been consulted he was started on IV milrinone and IV diuresis with poor response.  Creatinine continues to deteriorate, CO-ox persistently low, he continues to be negative with fair urine output, his weight is not improving and he is persistently hyponatremic. His weight is not improving CVP continues to be elevated.  Patient adamantly refuses to meet with  Palliative Care to discuss end-of-life has an extremely poor prognosis.   Assessment & Plan:   Principal Problem:   Acute on chronic combined systolic and diastolic CHF (congestive heart failure) (HCC) Active Problems:   Chronic anticoagulation   Atypical atrial flutter (HCC)   Chronic kidney disease, stage 3b (HCC)   Cellulitis of right leg   AKI (acute kidney injury) (HCC)    Acute on chronic combined systolic and diastolic CHF (congestive heart failure) (HCC) -Echo shows EF 25%.  CHF team following - On midodrine 10 mg 3 times daily.  On milrinone. - Unna boots in place - Diurectics per CHF team.  - Not a candidate for advanced CHF therapies.  Seen by palliative care team.  Low threshold to transition to  ICU.    Acute kidney injury on chronic kidney disease stage IIIb: Cardiorenal syndrome -Baseline creatinine 2.0.  Creatinine is up to 3.55.  Sepsis due to right lower ext cellulitis: Persistent Leukocytosis -Dopplers negative for DVT.  Currently on linezolid complete total 14-day course. No evidence of active infection for now.  PICC line site look ok.  Check UA and Procal No evidence of diarrhea or abd pain to suggest any form of colitis, including C diff.    History of VT/IVCD placement: -Management per CHF team.  Currently on amiodarone drip, mexiletine  History of a flutter status post ablation: -On amiodarone and Eliquis  History of LV thrombus: -Continue Eliquis      DVT prophylaxis: Eliquis Code Status: Full code Family Communication:   Status is: Inpatient  Remains inpatient appropriate because:Inpatient level of care appropriate due to severity of illness  Dispo: The patient is from: Home              Anticipated d/c is to: Home              Patient currently is not medically stable to d/c.  Tiviv Lasix treatment with close monitoring of renal function   Difficult to place patient No       Nutritional status           Body mass index is 49.83 kg/m.           Subjective: Has some dyspnea at rest, sitting on the recline.  No diarrhea.  Overall feels ok. Remains afebrile  overnight.   Review of Systems Otherwise negative except as per HPI, including: General = no fevers, chills, dizziness,  fatigue HEENT/EYES = negative for loss of vision, double vision, blurred vision,  sore throa Cardiovascular= negative for chest pain, palpitation Respiratory/lungs= negative for shortness of breath, cough, wheezing; hemoptysis,  Gastrointestinal= negative for nausea, vomiting, abdominal pain Genitourinary= negative for Dysuria MSK = Negative for arthralgia, myalgias Neurology= Negative for headache, numbness, tingling  Psychiatry= Negative for  suicidal and homocidal ideation Skin= Negative for Rash   Examination: Constitutional: Not in acute distress Respiratory: Clear to auscultation bilaterally Cardiovascular: Normal sinus rhythm, no rubs Abdomen: Nontender nondistended good bowel sounds Musculoskeletal: No edema noted Skin: b/l LE unna boots in place.  Neurologic: CN 2-12 grossly intact.  And nonfocal Psychiatric: Normal judgment and insight. Alert and oriented x 3. Normal mood.    Objective: Vitals:   08/15/20 1932 08/15/20 2320 09/02/2020 0405 08/19/2020 0729  BP: 101/64 119/69 115/78 (!) 127/57  Pulse: 91 96 94 97  Resp: 20 18 20  (!) 23  Temp: 98.2 F (36.8 C) 98 F (36.7 C) 98.2 F (36.8 C) 98 F (36.7 C)  TempSrc: Oral Oral Oral Oral  SpO2: 92% 94% 92% 93%  Weight:   (!) 148.6 kg   Height:        Intake/Output Summary (Last 24 hours) at 08/29/2020 1227 Last data filed at 08/15/2020 2300 Gross per 24 hour  Intake 743.09 ml  Output 730 ml  Net 13.09 ml   Filed Weights   08/14/20 0359 08/15/20 0430 08/21/2020 0405  Weight: (!) 149 kg (!) 148.6 kg (!) 148.6 kg     Data Reviewed:   CBC: Recent Labs  Lab 08/12/20 0512 08/13/20 0232 08/14/20 0415 08/15/20 0550 08/15/20 1051 09/10/2020 0646  WBC 14.5* 14.7* 14.7* 13.6* 14.3* 15.1*  NEUTROABS 10.5* 11.8* 11.3* 10.5*  --  11.4*  HGB 13.2 13.2 13.3 13.3 13.5 13.5  HCT 40.1 40.0 39.9 40.4 41.5 42.6  MCV 92.4 90.3 90.9 91.6 91.6 92.6  PLT 246 253 285 292 329 351   Basic Metabolic Panel: Recent Labs  Lab 08/13/20 0232 08/14/20 0415 08/14/20 0954 08/15/20 0550 08/15/20 1051 09/02/2020 0646  NA 128*  --  129* 131* 129* 129*  K 3.5  --  3.6 4.4 4.0 4.2  CL 98  --  99 99 102 101  CO2 18*  --  17* 17* 17* 15*  GLUCOSE 195*  --  210* 131* 141* 152*  BUN 45*  --  50* 52* 51* 52*  CREATININE 3.06*  --  3.34* 3.55* 3.45* 3.76*  CALCIUM 8.5*  --  8.8* 9.1 8.8* 9.0  MG 2.1 2.0  --  2.0 2.0 1.8   GFR: Estimated Creatinine Clearance: 31.9 mL/min (A) (by  C-G formula based on SCr of 3.76 mg/dL (H)). Liver Function Tests: No results for input(s): AST, ALT, ALKPHOS, BILITOT, PROT, ALBUMIN in the last 168 hours. No results for input(s): LIPASE, AMYLASE in the last 168 hours. No results for input(s): AMMONIA in the last 168 hours. Coagulation Profile: No results for input(s): INR, PROTIME in the last 168 hours. Cardiac Enzymes: No results for input(s): CKTOTAL, CKMB, CKMBINDEX, TROPONINI in the last 168 hours. BNP (last 3 results) No results for input(s): PROBNP in the last 8760 hours. HbA1C: No results for input(s): HGBA1C in the last 72 hours.  CBG: Recent Labs  Lab 08/15/20 0621 08/15/20 1155 08/15/20 1634 08/15/20 2036 08/22/2020 0641  GLUCAP 160* 137* 177* 156* 161*  Lipid Profile: No results for input(s): CHOL, HDL, LDLCALC, TRIG, CHOLHDL, LDLDIRECT in the last 72 hours. Thyroid Function Tests: No results for input(s): TSH, T4TOTAL, FREET4, T3FREE, THYROIDAB in the last 72 hours. Anemia Panel: No results for input(s): VITAMINB12, FOLATE, FERRITIN, TIBC, IRON, RETICCTPCT in the last 72 hours. Sepsis Labs: Recent Labs  Lab 08/12/20 1750 08/13/20 0232  LATICACIDVEN 1.5 1.4    No results found for this or any previous visit (from the past 240 hour(s)).       Radiology Studies: ECHOCARDIOGRAM COMPLETE  Result Date: 08/14/2020    ECHOCARDIOGRAM REPORT   Patient Name:   Roberto [name] [facility] Date of Exam: 08/14/2020 Medical Rec #:  [id number]      Height:       68.0 in Accession #:    [id number]     Weight:       328.5 lb Date of Birth:  1965/03/09      BSA:          2.524 m Patient Age:    54 years       BP:           97/78 mmHg Patient Gender: M              HR:           86 bpm. Exam Location:  Inpatient Procedure: 2D Echo, Cardiac Doppler and Color Doppler Indications:    CHF  History:        Patient has prior history of Echocardiogram examinations, most                 recent 12/14/2019. CHF and Cardiomyopathy; CAD.  Sonographer:     Eulah Pont RDCS Referring Phys: [phone] AMY D CLEGG IMPRESSIONS  1. Left ventricular ejection fraction, by estimation, is 20%. The left ventricle has severely decreased function. The left ventricle demonstrates global hypokinesis. The left ventricular internal cavity size was moderately dilated. There is mild eccentric left ventricular hypertrophy. Left ventricular diastolic parameters are consistent with Grade III diastolic dysfunction (restrictive). Elevated left atrial pressure.  2. Right ventricular systolic function is mildly reduced. The right ventricular size is normal. There is moderately elevated pulmonary artery systolic pressure.  3. The mitral valve is normal in structure. Moderate mitral valve regurgitation. No evidence of mitral stenosis.  4. Tricuspid valve regurgitation is moderate.  5. The aortic valve is grossly normal. Aortic valve regurgitation is not visualized. No aortic stenosis is present.  6. The inferior vena cava is dilated in size with <50% respiratory variability, suggesting right atrial pressure of 15 mmHg. Comparison(s): Prior images reviewed side by side. The left ventricular function is worsened. The right ventricular systolic function is worse. FINDINGS  Left Ventricle: Left ventricular ejection fraction, by estimation, is 20%. The left ventricle has severely decreased function. The left ventricle demonstrates global hypokinesis. The left ventricular internal cavity size was moderately dilated. There is  mild eccentric left ventricular hypertrophy. Left ventricular diastolic parameters are consistent with Grade III diastolic dysfunction (restrictive). Elevated left atrial pressure. Right Ventricle: The right ventricular size is normal. No increase in right ventricular wall thickness. Right ventricular systolic function is mildly reduced. There is moderately elevated pulmonary artery systolic pressure. The tricuspid regurgitant velocity is 2.86 m/s, and with an assumed right  atrial pressure of 15 mmHg, the estimated right ventricular systolic pressure is 47.7 mmHg. Left Atrium: Left atrial size was normal in size. Right Atrium: Right atrial size was normal in size. Pericardium: There is  no evidence of pericardial effusion. Mitral Valve: The mitral valve is normal in structure. Moderate mitral valve regurgitation, with eccentric posteriorly directed jet. No evidence of mitral valve stenosis. Tricuspid Valve: The tricuspid valve is normal in structure. Tricuspid valve regurgitation is moderate . No evidence of tricuspid stenosis. Aortic Valve: The aortic valve is grossly normal. Aortic valve regurgitation is not visualized. No aortic stenosis is present. Pulmonic Valve: The pulmonic valve was grossly normal. Pulmonic valve regurgitation is not visualized. No evidence of pulmonic stenosis. Aorta: The aortic root is normal in size and structure. Venous: The inferior vena cava is dilated in size with less than 50% respiratory variability, suggesting right atrial pressure of 15 mmHg. IAS/Shunts: No atrial level shunt detected by color flow Doppler. Additional Comments: A device lead is visualized in the right ventricle.  LEFT VENTRICLE PLAX 2D LVIDd:         6.10 cm LVIDs:         5.50 cm LV PW:         1.20 cm LV IVS:        1.20 cm LVOT diam:     2.10 cm LV SV:         50 LV SV Index:   20 LVOT Area:     3.46 cm  LV Volumes (MOD) LV vol d, MOD A2C: 211.0 ml LV vol d, MOD A4C: 201.0 ml LV vol s, MOD A2C: 155.0 ml LV vol s, MOD A4C: 158.0 ml LV SV MOD A2C:     56.0 ml LV SV MOD A4C:     201.0 ml LV SV MOD BP:      49.7 ml RIGHT VENTRICLE RV S prime:     8.78 cm/s TAPSE (M-mode): 1.9 cm LEFT ATRIUM              Index       RIGHT ATRIUM           Index LA diam:        4.70 cm  1.86 cm/m  RA Area:     15.00 cm LA Vol (A2C):   111.0 ml 43.99 ml/m RA Volume:   36.60 ml  14.50 ml/m LA Vol (A4C):   105.0 ml 41.61 ml/m LA Biplane Vol: 115.0 ml 45.57 ml/m  AORTIC VALVE LVOT Vmax:   109.00 cm/s  LVOT Vmean:  62.280 cm/s LVOT VTI:    0.144 m  AORTA Ao Root diam: 3.00 cm Ao Asc diam:  2.60 cm MITRAL VALVE               TRICUSPID VALVE MV Area (PHT): 5.06 cm    TR Peak grad:   32.7 mmHg MV Decel Time: 150 msec    TR Vmax:        286.00 cm/s MV E velocity: 88.76 cm/s MV A velocity: 37.35 cm/s  SHUNTS MV E/A ratio:  2.38        Systemic VTI:  0.14 m                            Systemic Diam: 2.10 cm Mihai Croitoru MD Electronically signed by Thurmon Fair MD Signature Date/Time: 08/14/2020/4:46:36 PM    Final         Scheduled Meds:  alteplase  2 mg Intracatheter Once   amiodarone  200 mg Oral BID   apixaban  5 mg Oral BID   Chlorhexidine Gluconate Cloth  6 each Topical Daily  guaiFENesin  600 mg Oral BID   insulin aspart  0-5 Units Subcutaneous QHS   insulin aspart  0-9 Units Subcutaneous TID WC   insulin aspart  2 Units Subcutaneous TID WC   metolazone  5 mg Oral Once   mexiletine  300 mg Oral Q12H   midodrine  10 mg Oral TID WC   pantoprazole  40 mg Oral Daily   ranolazine  500 mg Oral BID   sodium chloride flush  10-40 mL Intracatheter Q12H   Continuous Infusions:  furosemide     magnesium sulfate bolus IVPB     milrinone 0.375 mcg/kg/min (09/06/2020 1003)     LOS: 12 days   Time spent= 35 mins    Zahir Eisenhour Joline Maxcyhirag Nechama Escutia, MD [facility]  If 7PM-7AM, please contact night-coverage  08/24/2020, 12:27 PM

## 2020-08-16 NOTE — Progress Notes (Signed)
ANTICOAGULATION CONSULT NOTE - Initial Consult  Pharmacy Consult for Heparin Indication: atrial fibrillation and LV thrombus  Allergies  Allergen Reactions   Shrimp [Shellfish Allergy] Hives and Itching    Patient Measurements: Height: 5\' 8"  (172.7 cm) Weight: (!) 148.6 kg (327 lb 11.2 oz) IBW/kg (Calculated) : 68.4 Heparin Dosing Weight: 104 kg  Vital Signs: Temp: 98.1 F (36.7 C) (08/04 1242) Temp Source: Oral (08/04 1242) BP: 109/73 (08/04 1729) Pulse Rate: 167 (08/04 1729)  Labs: Recent Labs    08/15/20 0550 08/15/20 1051 08/30/2020 0646 09/05/2020 1728 08/15/2020 1729  HGB 13.3 13.5 13.5 15.0 15.0  HCT 40.4 41.5 42.6 44.0 44.0  PLT 292 329 351  --   --   CREATININE 3.55* 3.45* 3.76*  --   --     Estimated Creatinine Clearance: 31.9 mL/min (A) (by C-G formula based on SCr of 3.76 mg/dL (H)).   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   Medtronic ICD   Arthritis    knee   Chronic combined systolic and diastolic CHF (congestive heart failure) (HCC)    Chronic venous insufficiency    Coronary artery disease    Deep vein thrombosis (HCC) 2007   left leg   Diverticulitis    c/b perforated colon and nonhealing abdominal wound s/p skin grafting, ileostomy   Hepatitis    patient denies this dx   Lupus anticoagulant positive    Morbid obesity (HCC)    Morbid obesity with BMI of 50.0-59.9, adult (HCC)    Mural thrombus of heart    coumadin   Nonischemic cardiomyopathy (HCC)    a) 12/17/11 echo: LVEF 25-30%, grade 3 diastolic dysfunction (c/w restriction), mod MR, mod LA/LV and mild RA dilatation; b) 12/18/11 cMRI: LVEF 38%, mod LV/mild RV dilatation, global HK, mild-mod RV sys dysfxn, no LV thrombus & patchy non-subendocardial delayed enhancement c/w infil dz or prior myocarditis; c. 07/2018 EF 25-30%.   Paroxysmal SVT (supraventricular tachycardia) (HCC)    Pneumonia    x 2   Pulmonary  embolism (HCC)    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   Ventricular tachyarrhythmia [facility])    Prior VT/VF   Wears glasses    Wound of abdomen     Medications:  Infusions:   fentaNYL infusion INTRAVENOUS     milrinone 0.375 mcg/kg/min (09/01/2020 1431)   norepinephrine     norepinephrine (LEVOPHED) Adult infusion     propofol (DIPRIVAN) infusion      Assessment: 54 YOM on Apixaban PTA for hx LV thrombus/Afib admitted since 7/22 and with worsening AoCHF exacerbation and renal function - s/p RHC earlier today with resp distress requiring intubation. Pharmacy consulted to hold Apixaban and start Heparin for anticoagulation.   Last Apixaban dose 8/4 @ 1000, will start Heparin drip at 2000 which will also be ~6h s/p cath.   The patient was previously therapeutic at a rate of 2300 units/hr in Dec 2020. Given this - will start a higher rate than recommended and monitor aPTT until correlated with HL.  Goal of Therapy:  Heparin level 0.3-0.7 units/ml aPTT 66-102 seconds Monitor platelets by anticoagulation protocol: Yes   Plan:  - Start Heparin at 2000 units/hr (20 ml/hr) - Daily aPTT/HL until correlated - Will continue to monitor for any signs/symptoms of bleeding and will follow up with heparin level in 6 hours   Thank you for allowing pharmacy to be a part of this patient's care.  Georgina Pillion, PharmD, BCPS Clinical Pharmacist Clinical phone for 08/29/2020: [phone] 08/14/2020 7:19 PM   **Pharmacist phone directory can now be found on [email or web address] (PW [email or web address]).  Listed under [facility].

## 2020-08-16 NOTE — Progress Notes (Signed)
PT Cancellation Note  Patient Details Name: Roberto [name] MRN: [id number] DOB: 09-17-1965   Cancelled Treatment:    Reason Eval/Treat Not Completed: Patient declined, no reason specified;Medical issues which prohibited therapy.  Asked to Hold today by RN due to medical issues.   09/03/2020  Jacinto Halim., PT Acute Rehabilitation Services [phone]  (pager) [phone]  (office)    Eliseo Gum Saraya Tirey 08/23/2020, 3:11 PM

## 2020-08-16 NOTE — Progress Notes (Addendum)
Notified by RN patient nauseated and vomiting. Complains of increased dyspnea at rest.   350 cc urine since 7 am  Going for RHC through IJ with SWAN this afternoon. Concern for low output HF.  [name]

## 2020-08-16 NOTE — Consult Note (Signed)
NAME:  Roberto [name] T Reimann, MRN:  [id number], DOB:  08-Feb-1965, LOS: 12 ADMISSION DATE:  07/16/2020 CONSULTATION DATE:  08/30/2020 REFERRING MD:  Bensimhon CHIEF COMPLAINT:  Acute respiratory failure   History of Present Illness:  55 year old man with PMHx significant for acute-on-chronic combined CHF, NICM, CAD, history of VT (s/p AICD placement 08/2018), Aflutter (on Eliquis), DVT/PE (LLE, provoked 2007), CKD stage IIIb (baseline ~2.0), diverticulitis (c/b perforated colon/nonhealing abdominal wound requiring skin grafting and ileostomy) who [facility] for RLE cellulitis.   Found to be in cardiogenic versus septic shock in the setting of decompensated HF and cellulitis. Advanced HF team was consulted and patient was started on milrinone and IV diuresis without significant improvement. Patient continued to have worsening cardiorenal syndrome with worsening renal failure and underwent RHC and Swan placement 8/4. During procedure, patient's respiratory status decompensated and patient was intubated. [facility] CV[facility].  PCCM consulted for management of respiratory failure and ICU admission in the setting of decompensated HF and cardiorenal syndrome.  Pertinent Medical History:   Past Medical History:  Diagnosis Date   AICD (automatic cardioverter/defibrillator) present 08/2018   Medtronic ICD   Arthritis    knee   Chronic combined systolic and diastolic CHF (congestive heart failure) (HCC)    Chronic venous insufficiency    Coronary artery disease    Deep vein thrombosis (HCC) 2007   left leg   Diverticulitis    c/b perforated colon and nonhealing abdominal wound s/p skin grafting, ileostomy   Hepatitis    patient denies this dx   Lupus anticoagulant positive    Morbid obesity (HCC)    Morbid obesity with BMI of 50.0-59.9, adult (HCC)    Mural thrombus of heart    coumadin   Nonischemic cardiomyopathy (HCC)    a) 12/17/11 echo: LVEF 25-30%, grade 3 diastolic dysfunction (c/w  restriction), mod MR, mod LA/LV and mild RA dilatation; b) 12/18/11 cMRI: LVEF 38%, mod LV/mild RV dilatation, global HK, mild-mod RV sys dysfxn, no LV thrombus & patchy non-subendocardial delayed enhancement c/w infil dz or prior myocarditis; c. 07/2018 EF 25-30%.   Paroxysmal SVT (supraventricular tachycardia) (HCC)    Pneumonia    x 2   Pulmonary embolism (HCC)    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   Ventricular tachyarrhythmia [facility](HCC)    Prior VT/VF   Wears glasses    Wound of abdomen    Significant Hospital Events: Including procedures, antibiotic start and stop dates in addition to other pertinent events   7/22 Admitted for cardiogenic vs. Septic shock with decompensated HF and RLE cellulitis 8/4 RHC + Swan placement. Little improvement in clinical status despite milrinone and diuresis. PCCM consulted for worsening respiratory failure intraprocedure. Intubated.  Interim History / Subjective:  Consulted for worsening respiratory status intraprocedure in cath lab  Objective:  Blood pressure 109/73, pulse (!) 167, temperature 98.1 F (36.7 C), temperature source Oral, resp. rate (!) 57, height 5\' 8"  (1.727 m), weight (!) 148.6 kg, SpO2 90 %. CVP:  [7 mmHg-8 mmHg] 7 mmHg      Intake/Output Summary (Last 24 hours) at 08/14/2020 1741 Last data filed at 08/15/2020 2300 Gross per 24 hour  Intake 400 ml  Output 530 ml  Net -130 ml   Filed Weights   08/14/20 0359 08/15/20 0430 09/08/2020 0405  Weight: (!) 149 kg (!) 148.6 kg (!) 148.6 kg   Physical Examination: General: Chronically ill-appearing middle-aged man in NAD. HEENT: [place]/AT, anicteric sclera, PERRL, moist  mucous membranes. Mallampati 4. Neuro: Awake, oriented x 3. Responds to verbal stimuli. Following commands consistently. Moves all 4 extremities spontaneously.  CV: Tachycardic to low 100s, no m/g/r. PULM: Breathing even and mildly labored, tachypneic with rates 30s. Lung fields with bilateral crackles. GI: Obese, soft,  nontender, nondistended. RLQ ileostomy present. Well-healed midline scar from prior surgical intervention/skin graft. Extremities: Bilateral symmetric 2+ LE edema noted. RUE PICC in place. Skin: Warm/dry, diffuse anasarca noted.  Resolved Hospital Problem List     Assessment & Plan:   Acute hypoxic respiratory failure in the setting of decompensated HF - Full vent support (4-8cc/kg IBW) - Generous PEEP, given body habitus - Minimize driving pressures as able - Usual ARDS Protocol - Wean FiO2 for O2 sat > 90% - Daily WUA/SBT - VAP bundle - Pulmonary hygiene - PAD protocol for sedation: Propofol and Fentanyl for goal RASS 0 to -1  Acute on chronic combined CHF Nonischemic cardiomyopathy Echo 08/14/2020 with EF 20%, grade III diastolic dysfunction. Taken to cath lab 8/4 for RHC and Swan placement. Co-ox 52% despite milrinone. RHC 8/4 with wedge 35. - Trend Co-ox, CVP - Strict I&Os - Diuresis per HF team (Lasix, metolazone) - Milrinone per HF team - Continue midodrine - Not a candidate for advanced HF therapies due to body habitus and ileostomy status  Atrial flutter, s/p ablation History of VT, s/p AICD placement History of PE/LLE DVT - LE dopplers negative for DVT - Heparin gtt for anticoagulation - No longer a candidate for Eliquis due to renal failure - Continue Amiodarone, mexiletine  Sepsis secondary to RLE cellulitis Lactic acidosis RLE Cellulitis POA. S/p 14-day course of antibiotic therapy (Linezolid). - Trend WBC, fever curve - Trend LA - F/u PCT - Levophed for goal MAP > 65  Acute renal failure on chronic CKD stage III Concern for cardiorenal syndrome Baseline Cr 1.9-2.6, worsening over the last several months. Cr now ~3.0. Uncertain if patient would tolerate renal replacement therapy at this juncture. - Trend BMP - Replete electrolytes as indicated - Monitor STRICT I&Os - Avoid nephrotoxic agents as able - Ensure adequate renal perfusion - Consider Nephro  consult, may need to obtain access for HD/CRRT  GOC - Per Primary Team - Not a candidate for advanced HF therapies due to body habitus and ileostomy status - Per HF team note, patient has spoken with palliative care previously and at present does not wish to reevaluate his decision for full code status/aggressive care  Best Practice: (right click and "Reselect all SmartList Selections" daily)   Per primary team  Labs:  CBC: Recent Labs  Lab 08/12/20 0512 08/13/20 0232 08/14/20 0415 08/15/20 0550 08/15/20 1051 31-Aug-2020 0646  WBC 14.5* 14.7* 14.7* 13.6* 14.3* 15.1*  NEUTROABS 10.5* 11.8* 11.3* 10.5*  --  11.4*  HGB 13.2 13.2 13.3 13.3 13.5 13.5  HCT 40.1 40.0 39.9 40.4 41.5 42.6  MCV 92.4 90.3 90.9 91.6 91.6 92.6  PLT 246 253 285 292 329 351   Basic Metabolic Panel: Recent Labs  Lab 08/13/20 0232 08/14/20 0415 08/14/20 0954 08/15/20 0550 08/15/20 1051 08/31/20 0646  NA 128*  --  129* 131* 129* 129*  K 3.5  --  3.6 4.4 4.0 4.2  CL 98  --  99 99 102 101  CO2 18*  --  17* 17* 17* 15*  GLUCOSE 195*  --  210* 131* 141* 152*  BUN 45*  --  50* 52* 51* 52*  CREATININE 3.06*  --  3.34* 3.55* 3.45* 3.76*  CALCIUM 8.5*  --  8.8* 9.1 8.8* 9.0  MG 2.1 2.0  --  2.0 2.0 1.8   GFR: Estimated Creatinine Clearance: 31.9 mL/min (A) (by C-G formula based on SCr of 3.76 mg/dL (H)). Recent Labs  Lab 08/12/20 1750 08/13/20 0232 08/14/20 0415 08/15/20 0550 08/15/20 1051 09/05/2020 0646  PROCALCITON  --   --   --   --   --  0.60  WBC  --  14.7* 14.7* 13.6* 14.3* 15.1*  LATICACIDVEN 1.5 1.4  --   --   --   --    Liver Function Tests: No results for input(s): AST, ALT, ALKPHOS, BILITOT, PROT, ALBUMIN in the last 168 hours. No results for input(s): LIPASE, AMYLASE in the last 168 hours. No results for input(s): AMMONIA in the last 168 hours.  ABG:    Component Value Date/Time   PHART 7.448 04/17/2020 1621   PCO2ART 50.0 (H) 04/17/2020 1621   PO2ART 34 (LL) 04/17/2020 1621    HCO3 31.7 (H) 04/17/2020 1621   HCO3 34.6 (H) 04/17/2020 1621   TCO2 26 05/24/2020 0827   ACIDBASEDEF 3.0 (H) 12/28/2018 1425   O2SAT 51.9 09/07/2020 1143    Coagulation Profile: No results for input(s): INR, PROTIME in the last 168 hours.  Cardiac Enzymes: No results for input(s): CKTOTAL, CKMB, CKMBINDEX, TROPONINI in the last 168 hours.  HbA1C: Hemoglobin A1C  Date/Time Value Ref Range Status  05/14/2020 11:08 AM 7.0 (A) 4.0 - 5.6 % Final  09/01/2019 09:08 AM 6.1 (A) 4.0 - 5.6 % Final   HbA1c POC (<> result, manual entry)  Date/Time Value Ref Range Status  05/14/2020 11:08 AM 7.0 4.0 - 5.6 % Final   Hgb A1c MFr Bld  Date/Time Value Ref Range Status  08/13/2020 02:32 AM 7.4 (H) 4.8 - 5.6 % Final    Comment:    (NOTE) Pre diabetes:          5.7%-6.4%  Diabetes:              >6.4%  Glycemic control for   <7.0% adults with diabetes   12/28/2018 02:00 PM 6.3 (H) 4.8 - 5.6 % Final    Comment:    (NOTE) Pre diabetes:          5.7%-6.4% Diabetes:              >6.4% Glycemic control for   <7.0% adults with diabetes    CBG: Recent Labs  Lab 08/15/20 1155 08/15/20 1634 08/15/20 2036 09/10/2020 0641 09/11/2020 1250  GLUCAP 137* 177* 156* 161* 154*   Review of Systems:   Review of systems completed with pertinent positives/negatives outlined in above HPI.  Past Medical History:  He,  has a past medical history of AICD (automatic cardioverter/defibrillator) present (08/2018), Arthritis, Chronic combined systolic and diastolic CHF (congestive heart failure) (HCC), Chronic venous insufficiency, Coronary artery disease, Deep vein thrombosis (HCC) (2007), Diverticulitis, Hepatitis, Lupus anticoagulant positive, Morbid obesity (HCC), Morbid obesity with BMI of 50.0-59.9, adult (HCC), Mural thrombus of heart, Nonischemic cardiomyopathy (HCC), Paroxysmal SVT (supraventricular tachycardia) (HCC), Pneumonia, Pulmonary embolism (HCC), Ventricular tachyarrhythmia (HCC), Wears glasses,  and Wound of abdomen.   Surgical History:   Past Surgical History:  Procedure Laterality Date   A-FLUTTER ABLATION N/A 11/17/2016   Procedure: A-FLUTTER ABLATION;  Surgeon: Regan Lemming, MD;  Location: [facility] INVASIVE CV LAB;  Service: Cardiovascular;  Laterality: N/A;   APPLICATION OF WOUND VAC N/A 09/05/2019   Procedure: APPLICATION OF WOUND VAC;  Surgeon: Merry Proud  S, MD;  Location: [facility] OR;  Service: Plastics;  Laterality: N/A;  PATIENT'S HOME WOUND VAC APPLIED.   BIOPSY  12/23/2018   Procedure: BIOPSY;  Surgeon: Meridee Score Netty Starring., MD;  Location: [facility] ENDOSCOPY;  Service: Gastroenterology;;   BIOPSY  01/23/2020   Procedure: BIOPSY;  Surgeon: Lemar Lofty., MD;  Location: [facility] ENDOSCOPY;  Service: Gastroenterology;;   CARDIAC CATHETERIZATION  06/2006   Angiographically normal cors   CARDIAC CATHETERIZATION  12/22/2011   R/LHC: normal cors, well-compensated HDs, LV dysfxn   CARDIOVERSION N/A 10/17/2016   Procedure: CARDIOVERSION;  Surgeon: Dolores Patty, MD;  Location: [facility] ENDOSCOPY;  Service: Cardiovascular;  Laterality: N/A;   CARDIOVERSION N/A 03/20/2017   Procedure: CARDIOVERSION;  Surgeon: Dolores Patty, MD;  Location: [facility] ENDOSCOPY;  Service: Cardiovascular;  Laterality: N/A;   COLECTOMY N/A 12/28/2018   Procedure: TOTAL COLECTOMY;  Surgeon: Emelia Loron, MD;  Location: [facility] OR;  Service: General;  Laterality: N/A;   FLEXIBLE SIGMOIDOSCOPY N/A 12/23/2018   Procedure: FLEXIBLE SIGMOIDOSCOPY;  Surgeon: Lemar Lofty., MD;  Location: [facility] ENDOSCOPY;  Service: Gastroenterology;  Laterality: N/A;   FLEXIBLE SIGMOIDOSCOPY N/A 01/23/2020   Procedure: FLEXIBLE SIGMOIDOSCOPY;  Surgeon: Meridee Score Netty Starring., MD;  Location: [facility] ENDOSCOPY;  Service: Gastroenterology;  Laterality: N/A;   ICD IMPLANT N/A 09/03/2018   Procedure: ICD Medtronic IMPLANT;  Surgeon: Regan Lemming, MD;  Location: [facility] INVASIVE CV LAB;  Service: Cardiovascular;  Laterality: N/A;    ILEOSTOMY N/A 12/28/2018   Procedure: ILEOSTOMY;  Surgeon: Emelia Loron, MD;  Location: [facility] OR;  Service: General;  Laterality: N/A;   IR RADIOLOGIST EVAL & MGMT  02/08/2019   JOINT REPLACEMENT     right knee   LEFT AND RIGHT HEART CATHETERIZATION WITH CORONARY ANGIOGRAM N/A 12/22/2011   Procedure: LEFT AND RIGHT HEART CATHETERIZATION WITH CORONARY ANGIOGRAM;  Surgeon: Dolores Patty, MD;  Location: [facility] CATH LAB;  Service: Cardiovascular;  Laterality: N/A;   PATELLAR TENDON REPAIR     Left   RIGHT HEART CATH N/A 10/20/2016   Procedure: RIGHT HEART CATH;  Surgeon: Dolores Patty, MD;  Location: [facility] INVASIVE CV LAB;  Service: Cardiovascular;  Laterality: N/A;   RIGHT HEART CATH N/A 04/17/2020   Procedure: RIGHT HEART CATH;  Surgeon: Dolores Patty, MD;  Location: [facility] INVASIVE CV LAB;  Service: Cardiovascular;  Laterality: N/A;   SKIN SPLIT GRAFT N/A 09/05/2019   Procedure: SKIN GRAFT SPLIT THICKNESS;  Surgeon: Allena Napoleon, MD;  Location: [facility] OR;  Service: Plastics;  Laterality: N/A;   TEE WITHOUT CARDIOVERSION N/A 03/20/2017   Procedure: TRANSESOPHAGEAL ECHOCARDIOGRAM (TEE);  Surgeon: Dolores Patty, MD;  Location: [facility] ENDOSCOPY;  Service: Cardiovascular;  Laterality: N/A;    Social History:   reports that he has never smoked. He has never used smokeless tobacco. He reports previous alcohol use. He reports that he does not use drugs.   Family History:  His family history includes Cancer in his mother; Clotting disorder in his mother; Diabetes in his father, sister, and another family member; Heart attack in his father; Hypertension in his mother and another family member; Obesity in his sister, sister and another family member. There is no history of Colon cancer, Stomach cancer, Esophageal cancer, Rectal cancer, Inflammatory bowel disease, Liver disease, or Pancreatic cancer.   Allergies: Allergies  Allergen Reactions   Shrimp [Shellfish Allergy] Hives and Itching    Home  Medications: Prior to Admission medications   Medication Sig Start Date End Date Taking? Authorizing Provider  amiodarone (PACERONE) 200 MG tablet Take 200 mg by mouth 2 (two) times daily.   Yes [provider]  benzonatate (TESSALON PERLES) 100 MG capsule Take 1 capsule (100 mg total) by mouth 3 (three) times daily as needed for cough. 07/27/20  Yes Bensimhon, Bevelyn Buckles, MD  carvedilol (COREG) 6.25 MG tablet Take 1 tablet (6.25 mg total) by mouth 2 (two) times daily with a meal. 06/10/20  Yes Duke Salvia, MD  dapagliflozin propanediol (FARXIGA) 10 MG TABS tablet Take 1 tablet (10 mg total) by mouth daily. 05/29/20  Yes Laurey Morale, MD  digoxin (LANOXIN) 0.125 MG tablet Take 0.5 tablets (0.0625 mg total) by mouth daily. 06/22/20  Yes Bensimhon, Bevelyn Buckles, MD  ELIQUIS 5 MG TABS tablet TAKE 1 TABLET(5 MG) BY MOUTH TWICE DAILY Patient taking differently: Take 5 mg by mouth 2 (two) times daily. 04/27/20  Yes Robbie Lis M, PA-C  mexiletine (MEXITIL) 150 MG capsule Take 2 capsules (300 mg total) by mouth every 12 (twelve) hours. 06/10/20  Yes Duke Salvia, MD  pantoprazole (PROTONIX) 40 MG tablet Take 1 tablet (40 mg total) by mouth daily. 06/22/20  Yes Bensimhon, Bevelyn Buckles, MD  potassium chloride SA (KLOR-CON) 20 MEQ tablet Take 2 tablets (40 mEq total) by mouth daily. 07/11/20  Yes Bensimhon, Bevelyn Buckles, MD  ranolazine (RANEXA) 500 MG 12 hr tablet Take 1 tablet (500 mg total) by mouth 2 (two) times daily. 06/10/20  Yes Duke Salvia, MD  spironolactone (ALDACTONE) 25 MG tablet Take 2 tablets (50 mg total) by mouth daily. 06/10/20  Yes Duke Salvia, MD  Torsemide 60 MG TABS Take 60 mg by mouth 2 (two) times daily.   Yes [provider]    Critical care time: 40 minutes   Tim Lair, PA-C [facility] 08/17/2020 5:41 PM  Please see [email or web address] for pager details.  From 7A-7P if no response, please call [phone] After hours, please call ELink  [phone]

## 2020-08-16 NOTE — Progress Notes (Signed)
  HF/SHOCK team  Patient developed respiratory distress prior to [place] in cath lab.   Swan placed successfully. [place] cath revealed markedly elevated filling pressures and low cardiac output despite milrinone at 0.375 mcg/kg/min.   Placed temporarily on BIPAP and moved to ICU.   On arrival to ICU, met by CCM team. Patient continued to decompensate. Intubated emergently by Dr. Tamala Julian and arterial line placed.   Started on NE and lasix gtt.   Stat CXR showed diffuse pulmonary edema and adequate Swan placement.   If not diuresing on current regimen mayn need CVVHD.   Prognosis quite concerning.   Family updated.  CCT 45 mins (not including RHC/Swan placement).   Glori Bickers, MD  11:10 PM

## 2020-08-16 NOTE — Progress Notes (Addendum)
Pharmacy Antibiotic Note  Roberto [name] is a 55 y.o. male admitted on 08-24-20 with concern for aspiration PNA. Pharmacy has been consulted for Zosyn dosing.  The patient is noted to have worsening AKI with continued SCr rise up to 3.76, nCrCl~22 ml/min - low threshold to reduce dose if continues to rise with AM labs.   Plan: - Zosyn 3.375g IV x 1 now followed by 3.375g EI every 8 hours - Will follow SCr trend on AM labs for dose adjustments - low threshold to reduce for AKI -  Will continue to follow renal function, culture results, LOT, and antibiotic de-escalation plans   Height: 5\' 8"  (172.7 cm) Weight: (!) 148.6 kg (327 lb 11.2 oz) IBW/kg (Calculated) : 68.4  Temp (24hrs), Avg:98.1 F (36.7 C), Min:97.88 F (36.6 C), Max:98.4 F (36.9 C)  Recent Labs  Lab 08/12/20 1750 08/13/20 0232 08/14/20 0415 08/14/20 0954 08/15/20 0550 08/15/20 1051 08/24/2020 0646  WBC  --  14.7* 14.7*  --  13.6* 14.3* 15.1*  CREATININE 3.07* 3.06*  --  3.34* 3.55* 3.45* 3.76*  LATICACIDVEN 1.5 1.4  --   --   --   --   --     Estimated Creatinine Clearance: 31.9 mL/min (A) (by C-G formula based on SCr of 3.76 mg/dL (H)).    Allergies  Allergen Reactions   Shrimp [Shellfish Allergy] Hives and Itching    Antimicrobials this admission: Vanc 7/23 >>7/29 Cefepime 7/23 >>7/29 Zyvox 7/29>8/1 Zosyn 8/4 >>  Dose adjustments this admission: N/a  Microbiology results: 7/23 Fluvid >> neg 7/23 BCx >> NGf  Thank you for allowing pharmacy to be a part of this patient's care.  8/23, PharmD, BCPS Clinical Pharmacist Clinical phone for 08/30/2020: [phone] 08/13/2020 7:53 PM   **Pharmacist phone directory can now be found on [email or web address] ([email or web address]).  Listed under [facility].

## 2020-08-16 NOTE — Procedures (Signed)
Intubation Procedure Note  Roberto [name]  [id number]  09-08-1965  Date:08/19/2020  Time:6:43 PM   Provider Performing:Adrinne Sze C Katrinka Blazing    Procedure: Intubation (31500)  Indication(s) Respiratory Failure  Consent Risks of the procedure as well as the alternatives and risks of each were explained to the patient and/or caregiver.  Consent for the procedure was obtained and is signed in the bedside chart   Anesthesia Etomidate, Versed, Fentanyl, and Rocuronium   Time Out Verified patient identification, verified procedure, site/side was marked, verified correct patient position, special equipment/implants available, medications/allergies/relevant history reviewed, required imaging and test results available.   Sterile Technique Usual hand hygeine, masks, and gloves were used   Procedure Description Patient positioned in bed supine.  Sedation given as noted above.  Patient was intubated with endotracheal tube using Glidescope.  View was Grade 1 full glottis .  Number of attempts was 1.  Colorimetric CO2 detector was consistent with tracheal placement.   Complications/Tolerance None; patient tolerated the procedure well. Chest X-ray is ordered to verify placement.   EBL Minimal   Specimen(s) None

## 2020-08-16 NOTE — Procedures (Signed)
Arterial Catheter Insertion Procedure Note  [facility]  [id number]  [birth year]  Date:08/17/2020  Time:6:44 PM    Provider Performing: Lorin Glass    Procedure: Insertion of Arterial Line (14239) with US guidance (53202)   Indication(s) Blood pressure monitoring and/or need for frequent ABGs  Consent Risks of the procedure as well as the alternatives and risks of each were explained to the patient and/or caregiver.  Consent for the procedure was obtained and is signed in the bedside chart  Anesthesia None   Time Out Verified patient identification, verified procedure, site/side was marked, verified correct patient position, special equipment/implants available, medications/allergies/relevant history reviewed, required imaging and test results available.   Sterile Technique Maximal sterile technique including full sterile barrier drape, hand hygiene, sterile gown, sterile gloves, mask, hair covering, sterile ultrasound probe cover (if used).   Procedure Description Area of catheter insertion was cleaned with chlorhexidine and draped in sterile fashion. With real-time ultrasound guidance an arterial catheter was placed into the right radial artery.  Appropriate arterial tracings confirmed on monitor.     Complications/Tolerance None; patient tolerated the procedure well.   EBL Minimal   Specimen(s) None

## 2020-08-16 NOTE — H&P (View-Only) (Signed)
Patient ID: Roberto [name], male   DOB: 08/17/1965, 54 y.o.   MRN: [id number]     Advanced Heart Failure Rounding Note  PCP-Cardiologist: Merrit Waugh, MD   Subjective:     Remains on milrinoe 0.375 mcg. Co-ox 52%. CVP 13  Creatinine continuing to trend up.   BP improved with midodrine.  Weight stable. Multiple unmeasured outputs from ileostomy. A little more shortness of breath today. Extremely weak. Hasn't had enough energy to participate in PT.     Objective:   Weight Range: (!) 148.6 kg Body mass index is 49.83 kg/m.   Vital Signs:   Temp:  [97.5 F (36.4 C)-98.2 F (36.8 C)] 98 F (36.7 C) (08/04 0729) Pulse Rate:  [87-97] 97 (08/04 0729) Resp:  [18-23] 23 (08/04 0729) BP: (92-127)/(43-78) 127/57 (08/04 0729) SpO2:  [91 %-97 %] 93 % (08/04 0729) Weight:  [148.6 kg] 148.6 kg (08/04 0405) Last BM Date: 08/14/20  Weight change: Filed Weights   08/14/20 0359 08/15/20 0430 09/08/2020 0405  Weight: (!) 149 kg (!) 148.6 kg (!) 148.6 kg    Intake/Output:   Intake/Output Summary (Last 24 hours) at 09/03/2020 0947 Last data filed at 08/15/2020 2300 Gross per 24 hour  Intake 793.09 ml  Output 930 ml  Net -136.91 ml      Physical Exam  CVP 13 General:  Sitting in the chair.  No resp difficulty HEENT: normal Neck: supple. JVP 13cm.  Carotids 2+ bilat; no bruits. No lymphadenopathy or thryomegaly appreciated. Cor: PMI nondisplaced. Regular rate & rhythm. No rubs, gallops or murmurs. Lungs: Diminished throughout Abdomen: obese, soft, nontender, nondistended. No hepatosplenomegaly. No bruits or masses. Good bowel sounds.+ ileostomy  Extremities: no cyanosis, clubbing, rash, 2+ bilateral LE edema, ACE wraps on, RUE PICC  Neuro: alert & orientedx3, cranial nerves grossly intact. moves all 4 extremities w/o difficulty. Affect pleasant  Telemetry   NSR 90s, less than 5 PVCs/min  Labs    CBC Recent Labs    08/15/20 0550 08/15/20 1051 08/21/2020 0646  WBC 13.6*  14.3* 15.1*  NEUTROABS 10.5*  --  11.4*  HGB 13.3 13.5 13.5  HCT 40.4 41.5 42.6  MCV 91.6 91.6 92.6  PLT 292 329 351   Basic Metabolic Panel Recent Labs    08/15/20 1051 09/06/2020 0646  NA 129* 129*  K 4.0 4.2  CL 102 101  CO2 17* 15*  GLUCOSE 141* 152*  BUN 51* 52*  CREATININE 3.45* 3.76*  CALCIUM 8.8* 9.0  MG 2.0 1.8   Liver Function Tests No results for input(s): AST, ALT, ALKPHOS, BILITOT, PROT, ALBUMIN in the last 72 hours.  No results for input(s): LIPASE, AMYLASE in the last 72 hours. Cardiac Enzymes No results for input(s): CKTOTAL, CKMB, CKMBINDEX, TROPONINI in the last 72 hours.  BNP: BNP (last 3 results) Recent Labs    07/19/20 0222 07/23/20 1117 07/21/2020 1707  BNP 959.9* 992.0* 1,426.8*    ProBNP (last 3 results) No results for input(s): PROBNP in the last 8760 hours.   D-Dimer No results for input(s): DDIMER in the last 72 hours. Hemoglobin A1C No results for input(s): HGBA1C in the last 72 hours.  Fasting Lipid Panel No results for input(s): CHOL, HDL, LDLCALC, TRIG, CHOLHDL, LDLDIRECT in the last 72 hours. Thyroid Function Tests No results for input(s): TSH, T4TOTAL, T3FREE, THYROIDAB in the last 72 hours.  Invalid input(s): FREET3  Other results:   Imaging    No results found.   Medications:     Scheduled   Medications:  alteplase  2 mg Intracatheter Once   amiodarone  200 mg Oral BID   apixaban  5 mg Oral BID   Chlorhexidine Gluconate Cloth  6 each Topical Daily   guaiFENesin  600 mg Oral BID   insulin aspart  0-5 Units Subcutaneous QHS   insulin aspart  0-9 Units Subcutaneous TID WC   insulin aspart  2 Units Subcutaneous TID WC   mexiletine  300 mg Oral Q12H   midodrine  10 mg Oral TID WC   pantoprazole  40 mg Oral Daily   ranolazine  500 mg Oral BID   sodium chloride flush  10-40 mL Intracatheter Q12H    Infusions:  milrinone 0.375 mcg/kg/min (08/15/20 1930)    PRN Medications: acetaminophen **OR** acetaminophen,  benzonatate, guaiFENesin-dextromethorphan, ondansetron (ZOFRAN) IV, sodium chloride flush, traZODone   Assessment/Plan   1. Acute on chronic combined CHF:  -NICM.  Echo 12/21 with EF 25-30%, moderate LV dilation. Repeat echo this admit  with LVEF 20%, RV mild to moderately HK. Has medtronic ICD.  Patient sent from office visit 07/30/2020 with cellulitis, as well as acute on chronic combined CHF. Lactate elevated to 3.6. Suspect cellulitis with sepsis in setting of acute/chronic systolic CHF. Now end-stage.  -Diuretic stopped 7/28 due to worsening renal function, restarted 7/30 with rising CVP. -On 0.375 milrinone. Co-ox 52%. CO2  15. -CVP 13. Weight stable. Continues to have significant edema and appears more dyspneic. Will give 120 mg lasix IV once and 5 mg metolazone. If not diuresing over next couple of hours, will arrange for RHC this afternoon. Concern for low output HF. -Medical therapy limited by hypotension and AKI on CKD.  -Continue midodrine 10 mg TID. BP improving. -Hold carvedilol and spironolactone in the setting of decompensated CHF and AKI. -Off digoxin and Farxiga with rising creatinine.   -Had discussion about GOC given recurrent HF exacerbations. Poor prognosis and struggling with end-stage HF + cardiorenal syndrome.  Not a candidate for advanced therapies given size and ileostomy. He wishes to remain full code for now. He has been discussing his wishes with family members. Has met with palliative care, not interested in seeing them again at this point. -Planning to establish with [facility]'s HF team after discharge. Moving to [place] to live with brother.  2. RLE cellulitis:  - Severe sepsis. Patient injured his leg PTA. No evidence of osteomyelitis or abscess on CT.  - Completed antibiotics - Has persistent leukocytosis, WBC 14-15 - Continue management per primary team  3. History of VT:  - Admission 05/2020 for VT with recurrence during admission, felt to be driving by  electrolyte imbalance. Seen by EP and started on amiodarone and mexiletine with no evidence of recurrence on last interrogation 07/19/20. Patient denies ICD shocks - Very frequent PVCs and runs of NSVT on tele initially, improved.  - Amio gtt now off, < 5 PVCs/min . Continue po amio. - Continue mexiletine - Continue to monitor electrolytes closely and replete as needed to maintain K >4, Mg >2  4. History of LV thrombus:  - Continue Eliquis - No evidence of bleeding. Hgb stable.  5. Atrial flutter s/p ablation:  - In SR  - Continue eliquis.  - Po amiodarone for rhythm control  6. AKI on CKD stage 3b:  - Variable baseline creatinine 1.8-2.5.   - 2.7 on admit, down to 1.95. Now 2.0 > 2.27 > 2.6>>2.9>>3>>3.06>>3.34>>3.55>>3.76 - Continue daily BMP - Likely cardiorenal. - Suspect component of ATN from low BP.    7. Diverticulitis s/p ileostomy  8. Hyponatremia:  -Hypervolemic hyponatremia -Improving -123>124>125>126>128>129>131>129>129   DISCHARGE PLANNING: -TOC following. Will need HH RN and PT. Assessing for 02.     Length of Stay: 12  FINCH, LINDSAY N, PA-C  08/27/2020, 9:47 AM  Advanced Heart Failure Team Pager [phone] (M-F; 7a - 5p)  Please contact [facility] for night-coverage after hours (5p -7a ) and weekends on [email or web address]   Patient seen and examined with the above-signed Advanced Practice Provider and/or Housestaff. I personally reviewed laboratory data, imaging studies and relevant notes. I independently examined the patient and formulated the important aspects of the plan. I have edited the note to reflect any of my changes or salient points. I have personally discussed the plan with the patient and/or family.  More sob today. CVP 14. Co-ox marginal on milrinone 0.375. Only mild response to IV lasix.   General:  Sitting in chair. SOB at rest  HEENT: normal Neck: supple. JVP to jaw  Carotids 2+ bilat; no bruits. No lymphadenopathy or thryomegaly  appreciated. Cor: PMI nondisplaced. Regular rate & rhythm. No rubs, gallops or murmurs. Lungs: + crackles Abdomen: Obese soft, nontender, nondistended. No hepatosplenomegaly. No bruits or masses. Good bowel sounds. + ileus  Extremities: no cyanosis, clubbing, rash, 2-3+ edema Neuro: alert & orientedx3, cranial nerves grossly intact. moves all 4 extremities w/o difficulty. Affect pleasant  He is more tenuous today with worsening HF and renal function despite milrinone. Will plan RHC today to further evaluate. May need to move to ICU.   Alesia Oshields, MD  5:00 PM      

## 2020-08-16 NOTE — Interval H&P Note (Signed)
History and Physical Interval Note:  08/31/2020 5:01 PM  Roberto [name]  has presented today for surgery, with the diagnosis of acute systolic heart failure.  The various methods of treatment have been discussed with the patient and family. After consideration of risks, benefits and other options for treatment, the patient has consented to  Procedure(s): RIGHT HEART CATH (N/A) as a surgical intervention.  [name] history has been reviewed, patient examined, no change in status, stable for surgery.  I have reviewed the patient's chart and labs.  Questions were answered to the patient's satisfaction.     Ashten Sarnowski

## 2020-08-17 ENCOUNTER — Encounter (HOSPITAL_COMMUNITY): Payer: Self-pay | Admitting: Internal Medicine

## 2020-08-17 ENCOUNTER — Inpatient Hospital Stay (HOSPITAL_COMMUNITY): Payer: BC Managed Care – PPO

## 2020-08-17 DIAGNOSIS — I5043 Acute on chronic combined systolic (congestive) and diastolic (congestive) heart failure: Secondary | ICD-10-CM | POA: Diagnosis not present

## 2020-08-17 LAB — CBC WITH DIFFERENTIAL/PLATELET
Abs Immature Granulocytes: 1.13 10*3/uL — ABNORMAL HIGH (ref 0.00–0.07)
Abs Immature Granulocytes: 1.86 10*3/uL — ABNORMAL HIGH (ref 0.00–0.07)
Basophils Absolute: 0.2 10*3/uL — ABNORMAL HIGH (ref 0.0–0.1)
Basophils Absolute: 0.3 10*3/uL — ABNORMAL HIGH (ref 0.0–0.1)
Basophils Relative: 1 %
Basophils Relative: 2 %
Eosinophils Absolute: 0.4 10*3/uL (ref 0.0–0.5)
Eosinophils Absolute: 0.5 10*3/uL (ref 0.0–0.5)
Eosinophils Relative: 2 %
Eosinophils Relative: 3 %
HCT: 37.8 % — ABNORMAL LOW (ref 39.0–52.0)
HCT: 40.5 % (ref 39.0–52.0)
Hemoglobin: 12.7 g/dL — ABNORMAL LOW (ref 13.0–17.0)
Hemoglobin: 13.3 g/dL (ref 13.0–17.0)
Immature Granulocytes: 7 %
Immature Granulocytes: 10 %
Lymphocytes Relative: 11 %
Lymphocytes Relative: 8 %
Lymphs Abs: 1.7 10*3/uL (ref 0.7–4.0)
Lymphs Abs: 1.5 10*3/uL (ref 0.7–4.0)
MCH: 30.1 pg (ref 26.0–34.0)
MCH: 30 pg (ref 26.0–34.0)
MCHC: 33.6 g/dL (ref 30.0–36.0)
MCHC: 32.8 g/dL (ref 30.0–36.0)
MCV: 89.6 fL (ref 80.0–100.0)
MCV: 91.4 fL (ref 80.0–100.0)
Monocytes Absolute: 1.5 10*3/uL — ABNORMAL HIGH (ref 0.1–1.0)
Monocytes Absolute: 1.8 10*3/uL — ABNORMAL HIGH (ref 0.1–1.0)
Monocytes Relative: 9 %
Monocytes Relative: 10 %
Neutro Abs: 11 10*3/uL — ABNORMAL HIGH (ref 1.7–7.7)
Neutro Abs: 12.4 10*3/uL — ABNORMAL HIGH (ref 1.7–7.7)
Neutrophils Relative %: 70 %
Neutrophils Relative %: 67 %
Platelets: 325 10*3/uL (ref 150–400)
Platelets: 350 10*3/uL (ref 150–400)
RBC: 4.22 MIL/uL (ref 4.22–5.81)
RBC: 4.43 MIL/uL (ref 4.22–5.81)
RDW: 15.3 % (ref 11.5–15.5)
RDW: 15.5 % (ref 11.5–15.5)
WBC: 15.8 10*3/uL — ABNORMAL HIGH (ref 4.0–10.5)
WBC: 18.4 10*3/uL — ABNORMAL HIGH (ref 4.0–10.5)
nRBC: 0.4 % — ABNORMAL HIGH (ref 0.0–0.2)
nRBC: 0.5 % — ABNORMAL HIGH (ref 0.0–0.2)

## 2020-08-17 LAB — PHOSPHORUS
Phosphorus: 3.8 mg/dL (ref 2.5–4.6)
Phosphorus: 4.3 mg/dL (ref 2.5–4.6)

## 2020-08-17 LAB — POCT I-STAT 7, (LYTES, BLD GAS, ICA,H+H)
Acid-base deficit: 8 mmol/L — ABNORMAL HIGH (ref 0.0–2.0)
Acid-base deficit: 11 mmol/L — ABNORMAL HIGH (ref 0.0–2.0)
Bicarbonate: 15.6 mmol/L — ABNORMAL LOW (ref 20.0–28.0)
Bicarbonate: 15.8 mmol/L — ABNORMAL LOW (ref 20.0–28.0)
Calcium, Ion: 1.18 mmol/L (ref 1.15–1.40)
Calcium, Ion: 1.14 mmol/L — ABNORMAL LOW (ref 1.15–1.40)
HCT: 40 % (ref 39.0–52.0)
HCT: 43 % (ref 39.0–52.0)
Hemoglobin: 13.6 g/dL (ref 13.0–17.0)
Hemoglobin: 14.6 g/dL (ref 13.0–17.0)
O2 Saturation: 98 %
O2 Saturation: 94 %
Patient temperature: 36
Patient temperature: 37.3
Potassium: 3.3 mmol/L — ABNORMAL LOW (ref 3.5–5.1)
Potassium: 4.5 mmol/L (ref 3.5–5.1)
Sodium: 133 mmol/L — ABNORMAL LOW (ref 135–145)
Sodium: 133 mmol/L — ABNORMAL LOW (ref 135–145)
TCO2: 16 mmol/L — ABNORMAL LOW (ref 22–32)
TCO2: 17 mmol/L — ABNORMAL LOW (ref 22–32)
pCO2 arterial: 24.9 mmHg — ABNORMAL LOW (ref 32.0–48.0)
pCO2 arterial: 37.3 mmHg (ref 32.0–48.0)
pH, Arterial: 7.4 (ref 7.350–7.450)
pH, Arterial: 7.237 — ABNORMAL LOW (ref 7.350–7.450)
pO2, Arterial: 92 mmHg (ref 83.0–108.0)
pO2, Arterial: 85 mmHg (ref 83.0–108.0)

## 2020-08-17 LAB — MAGNESIUM
Magnesium: 2 mg/dL (ref 1.7–2.4)
Magnesium: 2.1 mg/dL (ref 1.7–2.4)
Magnesium: 1.8 mg/dL (ref 1.7–2.4)

## 2020-08-17 LAB — BASIC METABOLIC PANEL
Anion gap: 14 (ref 5–15)
Anion gap: 13 (ref 5–15)
BUN: 54 mg/dL — ABNORMAL HIGH (ref 6–20)
BUN: 53 mg/dL — ABNORMAL HIGH (ref 6–20)
CO2: 15 mmol/L — ABNORMAL LOW (ref 22–32)
CO2: 17 mmol/L — ABNORMAL LOW (ref 22–32)
Calcium: 8.5 mg/dL — ABNORMAL LOW (ref 8.9–10.3)
Calcium: 8.5 mg/dL — ABNORMAL LOW (ref 8.9–10.3)
Chloride: 101 mmol/L (ref 98–111)
Chloride: 102 mmol/L (ref 98–111)
Creatinine, Ser: 3.6 mg/dL — ABNORMAL HIGH (ref 0.61–1.24)
Creatinine, Ser: 3.61 mg/dL — ABNORMAL HIGH (ref 0.61–1.24)
GFR, Estimated: 19 mL/min — ABNORMAL LOW (ref >60)
GFR, Estimated: 19 mL/min — ABNORMAL LOW (ref >60)
Glucose, Bld: 163 mg/dL — ABNORMAL HIGH (ref 70–99)
Glucose, Bld: 182 mg/dL — ABNORMAL HIGH (ref 70–99)
Potassium: 3.2 mmol/L — ABNORMAL LOW (ref 3.5–5.1)
Potassium: 4.2 mmol/L (ref 3.5–5.1)
Sodium: 130 mmol/L — ABNORMAL LOW (ref 135–145)
Sodium: 132 mmol/L — ABNORMAL LOW (ref 135–145)

## 2020-08-17 LAB — GLUCOSE, CAPILLARY
Glucose-Capillary: 137 mg/dL — ABNORMAL HIGH (ref 70–99)
Glucose-Capillary: 138 mg/dL — ABNORMAL HIGH (ref 70–99)
Glucose-Capillary: 145 mg/dL — ABNORMAL HIGH (ref 70–99)
Glucose-Capillary: 162 mg/dL — ABNORMAL HIGH (ref 70–99)
Glucose-Capillary: 163 mg/dL — ABNORMAL HIGH (ref 70–99)
Glucose-Capillary: 168 mg/dL — ABNORMAL HIGH (ref 70–99)
Glucose-Capillary: 189 mg/dL — ABNORMAL HIGH (ref 70–99)

## 2020-08-17 LAB — COOXEMETRY PANEL
Carboxyhemoglobin: 1.3 % (ref 0.5–1.5)
Carboxyhemoglobin: 1.1 % (ref 0.5–1.5)
Methemoglobin: 0.9 % (ref 0.0–1.5)
Methemoglobin: 0.9 % (ref 0.0–1.5)
O2 Saturation: 73.7 %
O2 Saturation: 60.4 %
Total hemoglobin: 12.8 g/dL (ref 12.0–16.0)
Total hemoglobin: 13.3 g/dL (ref 12.0–16.0)

## 2020-08-17 LAB — HEPARIN LEVEL (UNFRACTIONATED): Heparin Unfractionated: >1.1 IU/mL — ABNORMAL HIGH (ref 0.30–0.70)

## 2020-08-17 LAB — APTT
aPTT: 49 seconds — ABNORMAL HIGH (ref 24–36)
aPTT: 63 seconds — ABNORMAL HIGH (ref 24–36)

## 2020-08-17 LAB — PROCALCITONIN: Procalcitonin: 1.3 ng/mL

## 2020-08-17 LAB — MRSA NEXT GEN BY PCR, NASAL: MRSA by PCR Next Gen: DETECTED — AB

## 2020-08-17 LAB — TRIGLYCERIDES: Triglycerides: 132 mg/dL (ref <150)

## 2020-08-17 MED ORDER — AMIODARONE HCL IN DEXTROSE 360-4.14 MG/200ML-% IV SOLN
30.0000 mg/h | INTRAVENOUS | Status: DC
  Administered 2020-08-18 – 2020-08-20 (×6): 30 mg/h via INTRAVENOUS
  Filled 2020-08-17 (×6): qty 200

## 2020-08-17 MED ORDER — PANTOPRAZOLE SODIUM 40 MG PO PACK
40.0000 mg | PACK | Freq: Every day | ORAL | Status: DC
  Administered 2020-08-17 – 2020-08-18 (×2): 40 mg
  Filled 2020-08-17 (×2): qty 20

## 2020-08-17 MED ORDER — AMIODARONE HCL IN DEXTROSE 360-4.14 MG/200ML-% IV SOLN
60.0000 mg/h | INTRAVENOUS | Status: AC
  Administered 2020-08-17 (×2): 60 mg/h via INTRAVENOUS

## 2020-08-17 MED ORDER — SODIUM BICARBONATE 8.4 % IV SOLN: 50.0000 meq | Freq: Once | INTRAVENOUS | Status: AC

## 2020-08-17 MED ORDER — MIDODRINE HCL 5 MG PO TABS
10.0000 mg | ORAL_TABLET | Freq: Three times a day (TID) | ORAL | Status: DC
  Administered 2020-08-17 – 2020-08-18 (×4): 10 mg
  Filled 2020-08-17 (×4): qty 2

## 2020-08-17 MED ORDER — AMIODARONE HCL IN DEXTROSE 360-4.14 MG/200ML-% IV SOLN
INTRAVENOUS | Status: AC
  Filled 2020-08-17: qty 400

## 2020-08-17 MED ORDER — VITAL HIGH PROTEIN PO LIQD: 1000.0000 mL | ORAL | Status: DC

## 2020-08-17 MED ORDER — NOREPINEPHRINE 16 MG/250ML-% IV SOLN
0.0000 ug/min | INTRAVENOUS | Status: DC
  Administered 2020-08-17: 35 ug/min via INTRAVENOUS
  Administered 2020-08-17: 15 ug/min via INTRAVENOUS
  Administered 2020-08-18: 76 ug/min via INTRAVENOUS
  Administered 2020-08-18: 60 ug/min via INTRAVENOUS
  Administered 2020-08-18 (×2): 80 ug/min via INTRAVENOUS
  Administered 2020-08-18: 55 ug/min via INTRAVENOUS
  Administered 2020-08-19: 80 ug/min via INTRAVENOUS
  Administered 2020-08-19: 95 ug/min via INTRAVENOUS
  Administered 2020-08-19: 78 ug/min via INTRAVENOUS
  Administered 2020-08-19 (×2): 80 ug/min via INTRAVENOUS
  Administered 2020-08-19: 78 ug/min via INTRAVENOUS
  Administered 2020-08-20: 82 ug/min via INTRAVENOUS
  Administered 2020-08-20: 90 ug/min via INTRAVENOUS
  Administered 2020-08-20: 82 ug/min via INTRAVENOUS
  Administered 2020-08-20: 85 ug/min via INTRAVENOUS
  Filled 2020-08-17 (×2): qty 250
  Filled 2020-08-17: qty 500
  Filled 2020-08-17 (×8): qty 250
  Filled 2020-08-17: qty 500
  Filled 2020-08-17 (×6): qty 250

## 2020-08-17 MED ORDER — SODIUM BICARBONATE 8.4 % IV SOLN
INTRAVENOUS | Status: AC
  Administered 2020-08-17: 50 meq via INTRAVENOUS
  Filled 2020-08-17: qty 50

## 2020-08-17 MED ORDER — VANCOMYCIN VARIABLE DOSE PER UNSTABLE RENAL FUNCTION (PHARMACIST DOSING): Status: DC

## 2020-08-17 MED ORDER — VITAL 1.5 CAL PO LIQD
1000.0000 mL | ORAL | Status: DC
  Administered 2020-08-17: 1000 mL

## 2020-08-17 MED ORDER — VANCOMYCIN HCL 10 G IV SOLR
2500.0000 mg | Freq: Once | INTRAVENOUS | Status: AC
  Administered 2020-08-17: 2500 mg via INTRAVENOUS
  Filled 2020-08-17: qty 2500

## 2020-08-17 MED ORDER — VASOPRESSIN 20 UNITS/100 ML INFUSION FOR SHOCK
0.0000 [IU]/min | INTRAVENOUS | Status: DC
  Administered 2020-08-17: 0.03 [IU]/min via INTRAVENOUS
  Administered 2020-08-18: 0.04 [IU]/min via INTRAVENOUS
  Administered 2020-08-18: 0.03 [IU]/min via INTRAVENOUS
  Administered 2020-08-18 – 2020-08-20 (×5): 0.04 [IU]/min via INTRAVENOUS
  Filled 2020-08-17 (×9): qty 100

## 2020-08-17 MED ORDER — POTASSIUM CHLORIDE 20 MEQ PO PACK
40.0000 meq | PACK | Freq: Once | ORAL | Status: AC
  Administered 2020-08-17: 40 meq
  Filled 2020-08-17: qty 2

## 2020-08-17 MED ORDER — PROSOURCE TF PO LIQD
45.0000 mL | Freq: Four times a day (QID) | ORAL | Status: DC
  Administered 2020-08-17 – 2020-08-18 (×3): 45 mL
  Filled 2020-08-17 (×4): qty 45

## 2020-08-17 MED ORDER — PROSOURCE TF PO LIQD
45.0000 mL | Freq: Two times a day (BID) | ORAL | Status: DC
  Administered 2020-08-17: 45 mL
  Filled 2020-08-17: qty 45

## 2020-08-17 MED ORDER — POTASSIUM CHLORIDE 20 MEQ PO PACK
40.0000 meq | PACK | Freq: Every day | ORAL | Status: DC
  Administered 2020-08-17: 40 meq
  Filled 2020-08-17: qty 2

## 2020-08-17 MED ORDER — MUPIROCIN 2 % EX OINT
1.0000 | TOPICAL_OINTMENT | Freq: Two times a day (BID) | CUTANEOUS | Status: DC
Start: 2020-08-17 — End: 2020-08-20
  Administered 2020-08-17 – 2020-08-20 (×7): 1 via NASAL
  Filled 2020-08-17: qty 22

## 2020-08-17 MED ORDER — AMIODARONE LOAD VIA INFUSION
150.0000 mg | Freq: Once | INTRAVENOUS | Status: AC
  Administered 2020-08-17: 150 mg via INTRAVENOUS
  Filled 2020-08-17: qty 83.34

## 2020-08-17 MED ORDER — METOCLOPRAMIDE HCL 5 MG/ML IJ SOLN: 5.0000 mg | Freq: Four times a day (QID) | INTRAMUSCULAR | Status: DC

## 2020-08-17 MED ORDER — AMIODARONE HCL 200 MG PO TABS
200.0000 mg | ORAL_TABLET | Freq: Two times a day (BID) | ORAL | Status: DC
  Administered 2020-08-17: 200 mg
  Filled 2020-08-17 (×2): qty 1

## 2020-08-17 NOTE — Progress Notes (Signed)
Heart rate acutely increased to 126-127. EKG reads as sinus tachycardia, but more likely atrial flutter.   Amiodarone bolus/load started.

## 2020-08-17 NOTE — Consult Note (Signed)
WOC Nurse ostomy follow up Patient receiving care in [facility] 2H10. Son at bedside; he tells me the patient has been independent in ostomy care prior to this hospitalization. Stoma type/location: RLQ ileostomy Stomal assessment/size: deferred, supplies must be obtained Peristomal assessment: deferred Treatment options for stomal/peristomal skin: barrier ring Output: thin green in existing pouch  Ostomy pouching: 2pc. 2 and 1/4 inch flat Education provided: none. Patient intubated, sedated. Order and place at the bedside the following ostomy care items: Gigi Gin #[phone]; skin barrier, Hart Rochester #644; barrier rings, Hart Rochester [phone].  You will need equal numbers of each.  Bedside nurse to perform ostomy care and pouch changes. WOC nurse will not follow at this time.  Please re-consult the WOC team if needed.  Helmut Muster, RN, MSN, CWOCN, CNS-BC, pager [phone]

## 2020-08-17 NOTE — Progress Notes (Addendum)
eLink Physician-Brief Progress Note Patient Name: Roberto [name] DOB: 1965-03-08 MRN: [id number]   Date of Service  08/17/2020  HPI/Events of Note  Notified of hypotension despite levophed at 60mcg/min, vasopressin 0.03 units/min and milrinone.  MAP 62.  Lasix has been held.    Pt also on amiodarone gtt and amiodarone PO order is still in place.  Pt with increasing O2 requirements and FiO2 increased to 50%.  eICU Interventions  Increase max dose of levophed to 48mcg/min. Check ABG.  Discontinue PO amiodarone.      Intervention Category Intermediate Interventions: Hypotension - evaluation and management  Larinda Buttery 08/17/2020, 10:12 PM  ABG 7.237/37.3/85 on TV 550, 18, PEEP 10, 50%.  Plan> Increase RR 24.  Repeat ABG in the AM. Give NaHCO3 IV push.  Increase max dose of levophed.  4:49 AM Notified of repeat ABG 7.284/33.4/76 on TV 550, 24, PEEP 10, 50%.   Plan> Continue on current vent settings.  5:57 AM Notified of rising lactate, persistent metabolic acidosis, worsening renal function.  Plan> Will have to consider CRRT.

## 2020-08-17 NOTE — Progress Notes (Signed)
Initial Nutrition Assessment  DOCUMENTATION CODES:   Morbid obesity  INTERVENTION:   Initiate tube feeding via OG tube: Vital 1.5 at 60 ml/h (1440 ml per day) Prosource TF 45 ml QID  Provides 2320 kcal (2455 kcal total with propofol), 141 gm protein, 1100 ml free water daily.  NUTRITION DIAGNOSIS:   Increased nutrient needs related to acute illness (cardiorenal syndrome) as evidenced by estimated needs.  GOAL:   Patient will meet greater than or equal to 90% of their needs  MONITOR:   Vent status, Labs, TF tolerance, I & O's  REASON FOR ASSESSMENT:   Ventilator, Consult Enteral/tube feeding initiation and management  ASSESSMENT:   55 yo male admitted with cardiogenic vs septic shock, decompensated CHF, and RLE cellulitis. Developed respiratory failure and required intubation 8/4. PMH includes CHF, NICM, CAD, SVT, A flutter, CKD stage IIIb, diverticulitis, ileostomy, AICD.  Received MD Consult for TF initiation and management. OG tube in place.  Patient with worsening cardiorenal syndrome 8/4, underwent RHC and Swan placement. During procedure, he required intubation. Still with pulmonary edema this morning. Prior to intubation, patient was on a heart healthy diet with good meal intakes, mostly 50-100% for the past 4-5 days.  Patient is currently intubated on ventilator support MV: 14.2 L/min Temp (24hrs), Avg:97.1 F (36.2 C), Min:95.9 F (35.5 C), Max:98.4 F (36.9 C)  Propofol: 8.9 ml/hr providing 235 kcal from lipid.   Drips: propofol, fentanyl, milrinone, furosemide, norepinephrine, heparin.  Labs reviewed. Na 133, K 3.3, BUN 54, Creat 3.6 CBG: 168-137  Medications reviewed and include Colace, Novolog, Protonix, potassium chloride, IV Zosyn.  Since admission 7/22, weight has decreased by 16.8 kg r/t diuresis. I/O -7.6 L since admission PTA weights ranged from 150.5 kg to 162.6 kg for the past 5 months.  NUTRITION - FOCUSED PHYSICAL EXAM:  Flowsheet Row  Most Recent Value  Orbital Region No depletion  Upper Arm Region No depletion  Thoracic and Lumbar Region No depletion  Buccal Region Unable to assess  Temple Region No depletion  Clavicle Bone Region No depletion  Clavicle and Acromion Bone Region No depletion  Scapular Bone Region Unable to assess  Dorsal Hand No depletion  Patellar Region No depletion  Anterior Thigh Region No depletion  Posterior Calf Region Unable to assess  Edema (RD Assessment) Unable to assess  [deep pitting edema to BLE and mild pitting edema to BUE per RN assessment]  Hair Reviewed  Eyes Unable to assess  Mouth Unable to assess  Skin Reviewed  Nails Reviewed       Diet Order:   Diet Order     None       EDUCATION NEEDS:   Not appropriate for education at this time  Skin:  Skin Assessment: Skin Integrity Issues: Skin Integrity Issues:: Other (Comment) Other: pretibial cellulitis RLE  Last BM:  350 ml via ileostomy x 24 hours  Height:   Ht Readings from Last 1 Encounters:  21-Aug-2020 5\' 8"  (1.727 m)    Weight:   Wt Readings from Last 1 Encounters:  08/17/20 (!) 137.2 kg    Ideal Body Weight:  70 kg  BMI:  Body mass index is 45.99 kg/m.  Estimated Nutritional Needs:   Kcal:  2200-2400  Protein:  135-155 gm  Fluid:  2-2.2 L    10/17/20, RD, LDN, CNSC Please refer to Amion for contact information.

## 2020-08-17 NOTE — Progress Notes (Signed)
Patient vomited tube feeds. They were held and OGT hooked to suction. Anders Simmonds NP notified. Also mentioned increasing need for levophed to him as well. Orders received to draw repeat co-ox and lactic and to get a Cortrak for further tube feeding. Awaiting results from labs.

## 2020-08-17 NOTE — Progress Notes (Signed)
PT Cancellation Note  Patient Details Name: Roberto [name] MRN: [id number] DOB: 07-Jul-1965   Cancelled Treatment:    Reason Eval/Treat Not Completed: Patient not medically ready (pt with respiratory distress and intubation since last visit and not yet medically appropriate)   [name] [name] 08/17/2020, 8:23 AM [name], PT Acute Rehabilitation Services Pager: [phone] Office: [phone]

## 2020-08-17 NOTE — Progress Notes (Addendum)
Clergy came in to pray with patient. Patient's HR increased from 90s to 120s and BP slightly decreased. Fentanyl bolus given and levophed gtt titrated up per order without a decrease in HR. EKG obtained, revealing ST. Durward Parcel NP notified and will relay the message to the HF team.   Addendum: orders received for amiodarone bolus and gtt to be started. Also notifed Bensimhon MD of levophed gtt at 30 post-bolus.

## 2020-08-17 NOTE — Progress Notes (Signed)
/  eLink Physician-Brief Progress Note /Patient Name: Roberto [name] DOB: 08-12-1965 MRN: [id number]   Date of Service  08/17/2020  HPI/Events of Note  ABG on 40%/PRVC 28/TV 550/P10 = 7.40/24.9/92/16.  eICU Interventions  Continue present ventilator management.     Intervention Category Major Interventions: Respiratory failure - evaluation and management  [name] 08/17/2020, 5:17 AM

## 2020-08-17 NOTE — Progress Notes (Addendum)
RN reported increase in Levophed support to 32 mcg/kg/min.  Lactic acid, co-ox and BMET ordered by CCM team.    Obtained PAC numbers:  PAP: 62/41 Wedge: 41 CO: 4.9 CI: 2 SVR: 825  Co-ox down to 60 from >70 this morning.   Vasopressin started in the setting of low SVR.  Concern for septic shock at this point -- temperature rising, tachycardia, hypotension, low SVR. Urine output also dropping.  Lactic acid, procalcitonin and CBC pending.   D/W Dr. Shirlee Latch -- would like to add gram-negative coverage (daptomycin); however, may need ID approval.  In the interim, will give x1 dose of vancomycin for now and monitor renal function.

## 2020-08-17 NOTE — Progress Notes (Signed)
Pharmacy Antibiotic Note  Roberto [name] is a 55 y.o. male admitted on 08/04/2020 with SOB and cardiogenic shock now with concern for sepsis.  Pharmacy has been consulted for vancomycin dosing.  Worsening leukocytosis and increasing vasopressor requirement. PCT 0.6. Of note, patient's renal fx has progressively declined.   Plan: -Vancomycin 2500 mg IV load -Will check a vancomycin level at 24 hours to assess clearance and prevent compounding AKI.  -Continue Zosyn for aspiration pneumonia  -Monitor CBC, renal fx, cultures and clinical progress  Height: 5\' 8"  (172.7 cm) Weight: (!) 137.2 kg (302 lb 7.5 oz) IBW/kg (Calculated) : 68.4  Temp (24hrs), Avg:97.4 F (36.3 C), Min:95.9 F (35.5 C), Max:99.5 F (37.5 C)  Recent Labs  Lab 08/12/20 1750 08/13/20 0232 08/14/20 0415 08/15/20 1051 2020/09/07 0646 2020-09-07 2050 08/17/20 0420 08/17/20 1532 08/17/20 1650  WBC  --  14.7*   < > 14.3* 15.1* 16.7* 15.8*  --  18.4*  CREATININE 3.07* 3.06*   < > 3.45* 3.76* 3.74* 3.60* 3.61*  --   LATICACIDVEN 1.5 1.4  --   --   --   --   --   --   --    < > = values in this interval not displayed.    Estimated Creatinine Clearance: 31.7 mL/min (A) (by C-G formula based on SCr of 3.61 mg/dL (H)).    Allergies  Allergen Reactions   Shrimp [Shellfish Allergy] Hives and Itching    Antimicrobials this admission: Vanc 7/23 >>7/29 Cefepime 7/23 >>7/29 Zyvox 7/29>8/1 Zosyn 8/4 >>   Dose adjustments this admission:   Microbiology results: 8/5 BCx >>  8/4 TA >>   Thank you for allowing pharmacy to be a part of this patient's care.  10/4, PharmD., BCPS, BCCCP Clinical Pharmacist Please refer to [facility] for unit-specific pharmacist

## 2020-08-17 NOTE — Progress Notes (Signed)
ANTICOAGULATION CONSULT NOTE  Pharmacy Consult for Heparin Indication: atrial fibrillation and LV thrombus  Allergies  Allergen Reactions   Shrimp [Shellfish Allergy] Hives and Itching    Patient Measurements: Height: 5\' 8"  (172.7 cm) Weight: (!) 137.2 kg (302 lb 7.5 oz) IBW/kg (Calculated) : 68.4 Heparin Dosing Weight: 104 kg  Vital Signs: Temp: 97.7 F (36.5 C) (08/05 1300) Temp Source: Core (08/05 1200) BP: 99/49 (08/05 1300) Pulse Rate: 125 (08/05 1300)  Labs: Recent Labs    08/26/2020 0646 08/29/2020 1728 08/18/2020 2050 08/17/20 0420 08/17/20 0446 08/17/20 1250  HGB 13.5   < > 13.2 12.7* 13.6  --   HCT 42.6   < > 41.0 37.8* 40.0  --   PLT 351  --  361 325  --   --   APTT  --   --   --  49*  --  63*  HEPARINUNFRC  --   --   --  >1.10*  --   --   CREATININE 3.76*  --  3.74* 3.60*  --   --    < > = values in this interval not displayed.     Estimated Creatinine Clearance: 31.8 mL/min (A) (by C-G formula based on SCr of 3.6 mg/dL (H)).   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   Medtronic ICD   Arthritis    knee   Chronic combined systolic and diastolic CHF (congestive heart failure) (HCC)    Chronic venous insufficiency    Coronary artery disease    Deep vein thrombosis (HCC) 2007   left leg   Diverticulitis    c/b perforated colon and nonhealing abdominal wound s/p skin grafting, ileostomy   Hepatitis    patient denies this dx   Lupus anticoagulant positive    Morbid obesity (HCC)    Morbid obesity with BMI of 50.0-59.9, adult (HCC)    Mural thrombus of heart    coumadin   Nonischemic cardiomyopathy (HCC)    a) 12/17/11 echo: LVEF 25-30%, grade 3 diastolic dysfunction (c/w restriction), mod MR, mod LA/LV and mild RA dilatation; b) 12/18/11 cMRI: LVEF 38%, mod LV/mild RV dilatation, global HK, mild-mod RV sys dysfxn, no LV thrombus & patchy non-subendocardial delayed enhancement  c/w infil dz or prior myocarditis; c. 07/2018 EF 25-30%.   Paroxysmal SVT (supraventricular tachycardia) (HCC)    Pneumonia    x 2   Pulmonary embolism (HCC)    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   Ventricular tachyarrhythmia (HCC)    Prior VT/VF   Wears glasses    Wound of abdomen     Medications:  Infusions:   sodium chloride 10 mL/hr at 08/17/20 1300   feeding supplement (VITAL 1.5 CAL) 1,000 mL (08/17/20 1148)   fentaNYL infusion INTRAVENOUS 50 mcg/hr (08/17/20 1300)   furosemide (LASIX) 200 mg in dextrose 5% 100 mL (2mg /mL) infusion 30 mg/hr (08/17/20 1300)   heparin 2,300 Units/hr (08/17/20 1300)   milrinone 0.375 mcg/kg/min (08/17/20 1300)   norepinephrine (LEVOPHED) Adult infusion 20 mcg/min (08/17/20 1300)   piperacillin-tazobactam (ZOSYN)  IV 3.375 g (08/17/20 1317)   propofol (DIPRIVAN) infusion 10 mcg/kg/min (08/17/20 1300)    Assessment: 54 YOM on Apixaban PTA for hx LV thrombus/Afib admitted since 7/22 and with worsening AoCHF exacerbation and renal function - s/p RHC 8/4 with resp distress requiring intubation. Pharmacy consulted to hold Apixaban and start Heparin for anticoagulation.   Repeat aPTT remains slightly below goal  at 63 seconds.  Goal of Therapy:  Heparin level 0.3-0.7 units/ml aPTT 66-102 seconds Monitor platelets by anticoagulation protocol: Yes   Plan:  -Increase heparin to 2450 units/h -Recheck heparin level with am labs  Fredonia Highland, PharmD, BCPS, [facility] Clinical Pharmacist [phone] Please check AMION for all [facility] numbers 08/17/2020

## 2020-08-17 NOTE — Progress Notes (Addendum)
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   Found to be in cardiogenic versus septic shock in the setting of decompensated HF and cellulitis. Advanced HF team was consulted and patient was started on milrinone and IV diuresis without significant improvement. Patient continued to have worsening cardiorenal syndrome with worsening renal failure and underwent RHC and Swan placement 8/4. During procedure, patient's respiratory status decompensated and patient was intubated. Transferred to [facility] CVICU.  PCCM consulted for management of respiratory failure and ICU admission in the setting of decompensated HF and cardiorenal syndrome.  Pertinent Medical History:   Past Medical History:  Diagnosis Date   AICD (automatic cardioverter/defibrillator) present 08/2018   Medtronic ICD   Arthritis    knee   Chronic combined systolic and diastolic CHF (congestive heart failure) (HCC)    Chronic venous insufficiency    Coronary artery disease    Deep vein thrombosis (HCC) 2007   left leg   Diverticulitis    c/b perforated colon and nonhealing abdominal wound s/p skin grafting, ileostomy   Hepatitis    patient denies this dx   Lupus anticoagulant positive    Morbid obesity (HCC)    Morbid obesity with BMI of 50.0-59.9, adult (HCC)    Mural thrombus of heart    coumadin   Nonischemic cardiomyopathy (HCC)    a) 12/17/11 echo: LVEF 25-30%, grade 3 diastolic dysfunction (c/w  restriction), mod MR, mod LA/LV and mild RA dilatation; b) 12/18/11 cMRI: LVEF 38%, mod LV/mild RV dilatation, global HK, mild-mod RV sys dysfxn, no LV thrombus & patchy non-subendocardial delayed enhancement c/w infil dz or prior myocarditis; c. 07/2018 EF 25-30%.   Paroxysmal SVT (supraventricular tachycardia) (HCC)    Pneumonia    x 2   Pulmonary embolism (HCC)    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   Ventricular tachyarrhythmia [facility])    Prior VT/VF   Wears glasses    Wound of abdomen    Significant Hospital Events: Including procedures, antibiotic start and stop dates in addition to other pertinent events   7/22 Admitted for cardiogenic vs. Septic shock with decompensated HF and RLE cellulitis 8/4 RHC + Swan placement. Little improvement in clinical status despite milrinone and diuresis. PCCM consulted for worsening respiratory failure intraprocedure. Intubated. Zosyn started for possible HCAP  8/5 hemodynamics improved. Still w/ pulm edema but CXR improving. Continuing preload reduction/and inotrope support. Starting tubefeeds.   Interim History / Subjective:  Stable over night   Objective:  Blood pressure 95/72, pulse 96, temperature (Abnormal) 97.34 F (36.3 C), resp. rate (Abnormal) 21, height 5\' 8"  (1.727 m), weight (Abnormal) 137.2 kg, SpO2 95 %. PAP: (39-68)/(23-49) 56/30 CVP:  [7 mmHg-25 mmHg] 15 mmHg CO:  [4.2 L/min-5.5 L/min] 5.1 L/min CI:  [1.7 L/min/m2-2.2 L/min/m2] 2 L/min/m2  Vent Mode: PRVC FiO2 (%):  [40 %-100 %] 40 % Set Rate:  [15 bmp-28 bmp] 28 bmp Vt Set:  [550 mL] 550 mL PEEP:  [10 cmH20-12 cmH20] 10 cmH20 Plateau Pressure:  [21 cmH20-32 cmH20] 21 cmH20  Intake/Output Summary (Last 24 hours) at 08/17/2020 3009 Last data filed at 08/17/2020 0700 Gross per 24 hour  Intake 1852.62 ml  Output 1480 ml  Net 372.62 ml   Filed Weights   08/15/20 0430 09/03/2020 0405 08/17/20 0457  Weight: (Abnormal) 148.6 kg (Abnormal) 148.6 kg (Abnormal) 137.2 kg   Physical  Examination:  General this is a 55 year old male. Sedated on vent.  HENT NCAT right IJ PAC in place. Dressing intact. Orally intubated.  Pulm coarse bilateral BS. Decreased bases.  Card rrr Abd soft. Obese hypoactive Ext warm strong pulses brisk CR. LE wrapped GU foley cath w/ clear yellow Neuro follows commands  Resolved Hospital Problem List   Sepsis secondary to RLE cellulitis s/p 14 d zyvox.   Assessment & Plan:   Acute hypoxic respiratory failure in the setting of decompensated HF W/ diffuse pulmonary edema  R/o HCAP/aspiration  Pcxr personally reviewed: improved aeration. Tubes/lines are in satisfactory position.  Plan Cont full vent support PAD protocol; RASS goal -2 VAP bundle  Cont diuresis Wean PEEP/FIO2 (given body habitus will hold peep at 10 today) AM cxr Zosyn day 2; follow cultures. Low threshold to stop   Acute on chronic combined CHF w/ cardiogenic shock  Nonischemic cardiomyopathy  Echo 08/14/2020 with EF 20%, grade III diastolic dysfunction.  Not candidate for advanced HF interventions d/t body habitus  Plan Cont to push preload reduction and titrate inotrope support for CI > 2 and SCVO2 > 70  Titrate norepi for MAP > 65 VT midodrine   Atrial flutter, s/p ablation History of VT, s/p AICD placement History of PE/LLE DVT - LE dopplers negative for DVT Plan IV heparin; holding DOAC w/ AKI Cont amiodarone and mexiletine   Acute renal failure on chronic CKD stage III Concern for cardiorenal syndrome -Baseline Cr 1.9-2.6, worsening over the last several months. Cr on PCCM consult ~3.0.  -scr improved w/ preload reduction and inotropes.  Plan Cont hemodynamic optimization  Cont IV lasix Renal dose meds Strict I&O Will trend q 12 hr chem At this point looks like can hold off on CRRT   Acid base and Fluid and electrolyte imbalance: NAG metabolic acidosis, hypokalemia and hyponatremia  Plan Replace K  Serial chem   Hyperglycemia Plan Ssi    GOC - Not a candidate for advanced HF therapies due to body habitus and ileostomy status - Per HF team note, patient has spoken with palliative care previously and at present does not wish to reevaluate his decision for full code status/aggressive care  Best Practice (right click and "Reselect all SmartList Selections" daily)   Diet/type: tubefeeds DVT prophylaxis: systemic heparin GI prophylaxis: PPI Lines: Central line Foley:  Yes, and it is still needed Code Status:  full code Last date of multidisciplinary goals of care discussion [see above ]   Critical care time: 32 min    Simonne Martinet ACNP-BC [facility] Pulmonary/Critical Care Pager # [phone] OR # [phone] if no answer   PCCM:   55 yo M, Obese male, non-ischemic cardiomyopathy, aflutter, PE/LLE DVT   BP (!) 116/100   Pulse (!) 103   Temp 98.6 F (37 C)   Resp (!) 32   Ht 5\' 8"  (1.727 m)   Wt (!) 137.2 kg   SpO2 92%   BMI 45.99 kg/m   Gen: obese male, intubated on life support  HENT: NCAT, ett in place  Heart: RRR, s1 s2  Lungs: BL vented breaths   Labs reviewed  A:  AHRF  BL acute pulmonary edema  HCAP  Cardiogenic shock  Non-ischemic cardiomyopathy  Aflutter  Acute renal failure on CKD   P: Full vent support  VAP ppx  Heparin ggt  On zosyn? Possibly stop not sure if we are dealing with infection  PAD guidelines  On lasix  Follow UOP and BMET   This patient is critically ill with multiple organ system failure; which, requires frequent high complexity decision making, assessment, support, evaluation, and titration of therapies. This was completed through the application of advanced monitoring technologies and extensive interpretation of multiple databases. During this encounter critical care time was devoted to patient care services described in this note for 32 minutes.   Josephine Igo, DO [facility] 08/17/2020 3:41 PM

## 2020-08-17 NOTE — Progress Notes (Addendum)
Entered in error

## 2020-08-17 NOTE — Procedures (Signed)
Cortrak  Person Inserting Tube:  Osa Craver, RD Tube Type:  Cortrak - 43 inches Tube Size:  10 Tube Location:  Right nare Initial Placement:  Stomach Secured by: Bridle Technique Used to Measure Tube Placement:  Marking at nare/corner of mouth Cortrak Secured At:  103 cm Procedure Comments:  Cortrak Tube Team Note:  Consult received to place a Cortrak feeding tube.   X-ray is required, abdominal x-ray has been ordered by the Cortrak team. Please confirm tube placement before using the Cortrak tube.   If the tube becomes dislodged please keep the tube and contact the Cortrak team at [email or web address] ([email or web address]) for replacement.  If after hours and replacement cannot be delayed, place a NG tube and confirm placement with an abdominal x-ray.   Romelle Starcher MS, RDN, LDN, CNSC Registered Dietitian III Clinical Nutrition RD Pager and On-Call Pager Number Located in [place]

## 2020-08-17 NOTE — Progress Notes (Signed)
Patient ID: Roberto [name], male   DOB: [birth year], 55 y.o.   MRN: [id number]     Advanced Heart Failure Rounding Note  PCP-Cardiologist: Glori Bickers, MD   Subjective:    Transferred to the CVICU on 8/4 in cardiogenic shock after acute decompensation post-RHC.  After RHC, he required BiPAP and transferred to the ICU.  Intubation followed immediately after transfer.  [place] on 8/4: RA = 9 RV = 53/11 PA = 60/29 (42) PCW = 35 (v = 39) Fick cardiac output/index = 3.8/1.5 Ao sat = 90% PA sat = 42%  Intubated and sedated this morning.  He follows commands and moves all extremities.   Remains on milrinone at 0.375, NE at 14 and Lasix at 30.  CVP of 10 and weight down from 327 lbs to 302 lbs.  UOP 1130 x24 hours and 225 this morning thus far.  Co-ox of 73.7%.  Hemodynamics: CO: 4.86 CI: 1.93 PCWP: 31 CVP: 10 SVR: 905 PAPi: 2.6     Objective:   Weight Range: (!) 137.2 kg Body mass index is 45.99 kg/m.   Vital Signs:   Temp:  [95.9 F (35.5 C)-98.4 F (36.9 C)] 97.7 F (36.5 C) (08/05 1143) Pulse Rate:  [86-252] 101 (08/05 1143) Resp:  [17-57] 32 (08/05 1143) BP: (88-123)/(56-92) 118/83 (08/05 1100) SpO2:  [89 %-100 %] 97 % (08/05 1143) Arterial Line BP: (92-123)/(40-55) 115/54 (08/05 1100) FiO2 (%):  [40 %-100 %] 40 % (08/05 1143) Weight:  [137.2 kg] 137.2 kg (08/05 0457) Last BM Date: 08/17/20  Weight change: Filed Weights   08/15/20 0430 08/15/2020 0405 08/17/20 0457  Weight: (!) 148.6 kg (!) 148.6 kg (!) 137.2 kg    Intake/Output:   Intake/Output Summary (Last 24 hours) at 08/17/2020 1150 Last data filed at 08/17/2020 1100 Gross per 24 hour  Intake 2578.7 ml  Output 2155 ml  Net 423.7 ml       Physical Exam  CVP 10 General:  Intubated and sedated.  MAE/FC  HEENT: ETT/OGT.  Normocephalic  Neck: supple. JVP difficult to assess due to RIJ CVC.  Carotids 2+ bilat; no bruits. No lymphadenopathy or thryomegaly appreciated. Cor: PMI nondisplaced. Regular rate  & rhythm. No rubs, gallops or murmurs. Lungs: Diminished throughout Abdomen: obese, soft, nontender, nondistended. No hepatosplenomegaly. No bruits or masses. Good bowel sounds.+ ileostomy  Extremities: no cyanosis, clubbing, rash, 2+ bilateral LE edema, ACE wraps on, RUE PICC  Neuro: Intubated and sedated.  MAE/FC   Telemetry   NSR in 80s (personally reviewed)   Labs    CBC Recent Labs    09/02/2020 0646 08/29/2020 1728 09/08/2020 2050 08/17/20 0420 08/17/20 0446  WBC 15.1*  --  16.7* 15.8*  --   NEUTROABS 11.4*  --   --  11.0*  --   HGB 13.5   < > 13.2 12.7* 13.6  HCT 42.6   < > 41.0 37.8* 40.0  MCV 92.6  --  92.1 89.6  --   PLT 351  --  361 325  --    < > = values in this interval not displayed.    Basic Metabolic Panel Recent Labs    09/11/2020 2050 08/17/20 0420 08/17/20 0446  NA 129* 130* 133*  K 4.2 3.2* 3.3*  CL 102 101  --   CO2 15* 15*  --   GLUCOSE 165* 163*  --   BUN 55* 54*  --   CREATININE 3.74* 3.60*  --   CALCIUM 8.9 8.5*  --  MG 2.4 2.0  2.1  --   PHOS  --  3.8  --     Liver Function Tests No results for input(s): AST, ALT, ALKPHOS, BILITOT, PROT, ALBUMIN in the last 72 hours.  No results for input(s): LIPASE, AMYLASE in the last 72 hours. Cardiac Enzymes No results for input(s): CKTOTAL, CKMB, CKMBINDEX, TROPONINI in the last 72 hours.  BNP: BNP (last 3 results) Recent Labs    07/19/20 0222 07/23/20 1117 07/14/2020 1707  BNP 959.9* 992.0* 1,426.8*     ProBNP (last 3 results) No results for input(s): PROBNP in the last 8760 hours.   D-Dimer No results for input(s): DDIMER in the last 72 hours. Hemoglobin A1C No results for input(s): HGBA1C in the last 72 hours.  Fasting Lipid Panel Recent Labs    08/17/20 0420  TRIG 132    Thyroid Function Tests No results for input(s): TSH, T4TOTAL, T3FREE, THYROIDAB in the last 72 hours.  Invalid input(s): FREET3  Other results:   Imaging    CARDIAC CATHETERIZATION  Result Date:  09/05/2020 Findings: On milrinone 0.375 mcg/kg/min RA = 9 RV = 53/11 PA = 60/29 (42) PCW = 35 (v = 39) Fick cardiac output/index = 3.8/1.5 Ao sat = 90% PA sat = 42% Assessment: Cardiogenic shock with markedly elevated filling pressures Plan/Discussion: Move to ICU. Start NE and lasix gtt. May need intubation and CVVHD Glori Bickers, MD 11:05 PM   DG Chest Port 1 View  Result Date: 08/17/2020 CLINICAL DATA:  Endotracheal tube EXAM: PORTABLE CHEST 1 VIEW COMPARISON:  Radiograph 08/26/2020, chest CT 03/21/2017 FINDINGS: Endotracheal tube tip overlies the midthoracic trachea. Pulmonary artery catheter tip overlies the right inter lobar artery. Unchanged AICD lead. There is a nasogastric tube which passes below the diaphragm, side port overlying the stomach. Unchanged, enlarged cardiac silhouette. There is diffuse airspace disease bilaterally, minimally improved from prior exam. No large pleural effusion or visible pneumothorax. IMPRESSION: Endotracheal tube overlies the midthoracic trachea. Diffuse airspace disease bilaterally, minimally improved from prior exam. Electronically Signed   By: Maurine Simmering   On: 08/17/2020 08:31   DG CHEST PORT 1 VIEW  Result Date: 09/04/2020 CLINICAL DATA:  Intubated, enteric catheter placement EXAM: PORTABLE CHEST 1 VIEW COMPARISON:  08/02/2020 FINDINGS: Single frontal view of the chest demonstrates endotracheal tube overlying tracheal air column tip midway between thoracic inlet and carina. Enteric catheter passes below diaphragm tip excluded by collimation. There is a right internal jugular flow directed central venous catheter tip overlying the right hilum. Right-sided PICC overlies superior vena cava. Single lead AICD unchanged. Cardiac silhouette is enlarged. There is widespread bilateral airspace disease, right greater than left. Small bilateral effusions. No pneumothorax. IMPRESSION: 1. Support devices as above. 2. Widespread bilateral airspace disease consistent with edema  or infection. Electronically Signed   By: Randa Ngo M.D.   On: 09/04/2020 19:14   DG Abd Portable 1V  Result Date: 08/15/2020 CLINICAL DATA:  Enteric catheter placement EXAM: PORTABLE ABDOMEN - 1 VIEW COMPARISON:  01/02/2019 FINDINGS: Frontal view of the lower chest and upper abdomen demonstrates an enteric catheter tip and side port projecting over the gastric body. Paucity of bowel gas. No evidence of obstruction. Bibasilar airspace disease and small effusions, right greater than left. IMPRESSION: 1. Enteric catheter overlying gastric body. Electronically Signed   By: Randa Ngo M.D.   On: 08/28/2020 19:15     Medications:     Scheduled Medications:  alteplase  2 mg Intracatheter Once   amiodarone  200 mg Per Tube BID   chlorhexidine gluconate (MEDLINE KIT)  15 mL Mouth Rinse BID   Chlorhexidine Gluconate Cloth  6 each Topical Daily   docusate  100 mg Per Tube BID   feeding supplement (PROSource TF)  45 mL Per Tube QID   fentaNYL (SUBLIMAZE) injection  50 mcg Intravenous Once   insulin aspart  0-15 Units Subcutaneous Q4H   mouth rinse  15 mL Mouth Rinse 10 times per day   mexiletine  300 mg Per Tube Q12H   midodrine  10 mg Per Tube TID WC   mupirocin ointment  1 application Nasal BID   pantoprazole sodium  40 mg Per Tube Daily   polyethylene glycol  17 g Per Tube Daily   potassium chloride  40 mEq Per Tube Daily   sodium chloride flush  10-40 mL Intracatheter Q12H   sodium chloride flush  10-40 mL Intracatheter Q12H    Infusions:  sodium chloride 10 mL/hr at 08/17/20 1100   feeding supplement (VITAL 1.5 CAL) 1,000 mL (08/17/20 1148)   fentaNYL infusion INTRAVENOUS 50 mcg/hr (08/17/20 1100)   furosemide (LASIX) 200 mg in dextrose 5% 100 mL (82m/mL) infusion 30 mg/hr (08/17/20 1100)   heparin 2,300 Units/hr (08/17/20 1100)   milrinone 0.375 mcg/kg/min (08/17/20 1100)   norepinephrine (LEVOPHED) Adult infusion 20 mcg/min (08/17/20 1100)   piperacillin-tazobactam (ZOSYN)   IV Stopped (08/17/20 1037)   propofol (DIPRIVAN) infusion 10 mcg/kg/min (08/17/20 1100)    PRN Medications: sodium chloride, acetaminophen **OR** acetaminophen, fentaNYL, ondansetron (ZOFRAN) IV, sodium chloride flush, sodium chloride flush   Assessment/Plan   Cardiogenic shock - Now intubated and sedated after acute decompensation on 8/4 - Milrinone at 0.375, NE at 14, Lasix at 30  - Co-ox 73.7, CI 1.9 via Fick  - CVP down to 10, weight down 25 pounds (unclear of accuracy of weight this morning) - Monitor UOP -- if not diuresing adequately, may need CVVHD   2. Acute on chronic combined CHF:  -NICM - Now intubated and sedated after developing shock.   - RHC on 8/4 as noted above  - Echo on 12/21 with EF 25-30%, moderate LV dilation. Repeat echo this admission with LVEF 20%, RV mild to moderately HK. Has medtronic ICD.  Patient sent from office visit 07/28/2020 with cellulitis, as well as acute on chronic combined CHF. Lactate elevated to 3.6. Suspect cellulitis with sepsis in setting of acute/chronic systolic CHF. Now end-stage.  -Diuretic stopped 7/28 due to worsening renal function, restarted 7/30 with rising CVP.  Now on Lasix drip at 30  -Medical therapy limited by hypotension and AKI on CKD.  -Wean norepinephrine to maintain MAP >65. Continue midodrine  -Hold carvedilol and spironolactone in the setting of decompensated CHF, shock and AKI  -Off digoxin and Farxiga with rising creatinine.   -Had discussion about GOC given recurrent HF exacerbations. Poor prognosis and struggling with end-stage HF + cardiorenal syndrome.  Not a candidate for advanced therapies given size and ileostomy. He wishes to remain full code for now. He has been discussing his wishes with family members. Has met with palliative care, not interested in seeing them again at this point. -[facility]  3. Acute hypoxemic  respiratory failure - In the setting of shock - Ventilator management per CCM  - Zosyn started prophylactically d/t concern for aspiration pneumonia - CXR on 8/5 with slight improvement from previous   4. RLE  cellulitis:  - Severe sepsis. Patient injured his leg PTA. No evidence of osteomyelitis or abscess on CT.  - Completed antibiotics -- s/p 14 days of Linezolid  - Has persistent leukocytosis, WBC 14-15 - Continue management per primary team  5. History of VT:  - Admission 05/2020 for VT with recurrence during admission, felt to be driving by electrolyte imbalance. Seen by EP and started on amiodarone and mexiletine with no evidence of recurrence on last interrogation 07/19/20. Patient denies ICD shocks - Very frequent PVCs and runs of NSVT on tele initially, improved.  - Amio gtt off.  Continue po amio. - Continue mexiletine - Continue to monitor electrolytes closely and replete as needed to maintain K >4, Mg >2 - More frequent ectopy noted -- K of 3.2 this morning.  Replete as aggressively as possible in the setting of AKI. If it continues, may need to resume IV amiodarone   6. History of LV thrombus:  - Switched from Eliquis to heparin drip due to AKI  - No evidence of bleeding. Hgb stable.  7. Atrial flutter s/p ablation:  - In SR  - Continue Heparin drip  - PO amiodarone for rhythm control  8. AKI on CKD stage 3b:  - Variable baseline creatinine 1.8-2.5.  - 2.7 on admit, down to 1.95. Now 2.0 > 2.27 > 2.6>>2.9>>3>>3.06>>3.34>>3.55>>3.76>>3.6 - Continue daily BMP - Likely cardiorenal - Suspect component of ATN from low BP.  9. Diverticulitis s/p ileostomy  10. Hyponatremia, improving  -Hypervolemic hyponatremia -Improving -123>124>125>126>128>129>131>129>129 - 133 this AM   11. Hypokalemia - Secondary to aggressive diuresis - Replete     Length of Stay: 13  Glori Bickers, MD  08/17/2020, 11:50 AM  Advanced Heart Failure Team Pager [phone] (M-F; 7a - 5p)   Please contact [facility] for night-coverage after hours (5p -7a ) and weekends on [email or web address]  Agree with above. Intubated last night.   Remains on vent. Now on NE and milrinone and lasix gtt. Urine output improved.  Creatinine down slightly   General:  Awake on vent HEENT: normal + ETT Neck: supple.RIJ swan Carotids 2+ bilat; no bruits. No lymphadenopathy or thryomegaly appreciated. Cor: PMI nondisplaced. Regular rate & rhythm. No rubs, gallops or murmurs. Lungs: clear Abdomen: obese oft, nontender, nondistended. No hepatosplenomegaly. No bruits or masses. Good bowel sounds. + ostomy Extremities: no cyanosis, clubbing, rash, 2+ edema Neuro: awake on vent nonfocal   Critically improved but improved overnight. Continue NE/milrinone and IV diuresis.  Low threshold to start IV amio for ectopy. Will continue diuresis. Hopefully can get off the vent soon. Long-term options very limited.   CRITICAL CARE Performed by: Glori Bickers  Total critical care time: 35 minutes  Critical care time was exclusive of separately billable procedures and treating other patients.  Critical care was necessary to treat or prevent imminent or life-threatening deterioration.  Critical care was time spent personally by me (independent of midlevel providers or residents) on the following activities: development of treatment plan with patient and/or surrogate as well as nursing, discussions with consultants, evaluation of patient's response to treatment, examination of patient, obtaining history from patient or surrogate, ordering and performing treatments and interventions, ordering and review of laboratory studies, ordering and review of radiographic studies, pulse oximetry and re-evaluation of patient's condition.  Glori Bickers, MD  11:50 AM

## 2020-08-18 ENCOUNTER — Inpatient Hospital Stay (HOSPITAL_COMMUNITY): Payer: BC Managed Care – PPO

## 2020-08-18 DIAGNOSIS — I5043 Acute on chronic combined systolic (congestive) and diastolic (congestive) heart failure: Secondary | ICD-10-CM | POA: Diagnosis not present

## 2020-08-18 LAB — BASIC METABOLIC PANEL
Anion gap: 13 (ref 5–15)
Anion gap: 12 (ref 5–15)
BUN: 56 mg/dL — ABNORMAL HIGH (ref 6–20)
BUN: 57 mg/dL — ABNORMAL HIGH (ref 6–20)
CO2: 15 mmol/L — ABNORMAL LOW (ref 22–32)
CO2: 15 mmol/L — ABNORMAL LOW (ref 22–32)
Calcium: 8.2 mg/dL — ABNORMAL LOW (ref 8.9–10.3)
Calcium: 8.2 mg/dL — ABNORMAL LOW (ref 8.9–10.3)
Chloride: 101 mmol/L (ref 98–111)
Chloride: 103 mmol/L (ref 98–111)
Creatinine, Ser: 3.93 mg/dL — ABNORMAL HIGH (ref 0.61–1.24)
Creatinine, Ser: 4.22 mg/dL — ABNORMAL HIGH (ref 0.61–1.24)
GFR, Estimated: 17 mL/min — ABNORMAL LOW (ref >60)
GFR, Estimated: 16 mL/min — ABNORMAL LOW (ref >60)
Glucose, Bld: 167 mg/dL — ABNORMAL HIGH (ref 70–99)
Glucose, Bld: 178 mg/dL — ABNORMAL HIGH (ref 70–99)
Potassium: 3.9 mmol/L (ref 3.5–5.1)
Potassium: 4.3 mmol/L (ref 3.5–5.1)
Sodium: 129 mmol/L — ABNORMAL LOW (ref 135–145)
Sodium: 130 mmol/L — ABNORMAL LOW (ref 135–145)

## 2020-08-18 LAB — POCT I-STAT 7, (LYTES, BLD GAS, ICA,H+H)
Acid-base deficit: 10 mmol/L — ABNORMAL HIGH (ref 0.0–2.0)
Acid-base deficit: 11 mmol/L — ABNORMAL HIGH (ref 0.0–2.0)
Bicarbonate: 15.8 mmol/L — ABNORMAL LOW (ref 20.0–28.0)
Bicarbonate: 15.2 mmol/L — ABNORMAL LOW (ref 20.0–28.0)
Calcium, Ion: 1.11 mmol/L — ABNORMAL LOW (ref 1.15–1.40)
Calcium, Ion: 1.1 mmol/L — ABNORMAL LOW (ref 1.15–1.40)
HCT: 40 % (ref 39.0–52.0)
HCT: 41 % (ref 39.0–52.0)
Hemoglobin: 13.6 g/dL (ref 13.0–17.0)
Hemoglobin: 13.9 g/dL (ref 13.0–17.0)
O2 Saturation: 93 %
O2 Saturation: 91 %
Patient temperature: 37.3
Patient temperature: 37.8
Potassium: 3.8 mmol/L (ref 3.5–5.1)
Potassium: 4.3 mmol/L (ref 3.5–5.1)
Sodium: 135 mmol/L (ref 135–145)
Sodium: 134 mmol/L — ABNORMAL LOW (ref 135–145)
TCO2: 17 mmol/L — ABNORMAL LOW (ref 22–32)
TCO2: 16 mmol/L — ABNORMAL LOW (ref 22–32)
pCO2 arterial: 33.4 mmHg (ref 32.0–48.0)
pCO2 arterial: 36.2 mmHg (ref 32.0–48.0)
pH, Arterial: 7.284 — ABNORMAL LOW (ref 7.350–7.450)
pH, Arterial: 7.235 — ABNORMAL LOW (ref 7.350–7.450)
pO2, Arterial: 76 mmHg — ABNORMAL LOW (ref 83.0–108.0)
pO2, Arterial: 74 mmHg — ABNORMAL LOW (ref 83.0–108.0)

## 2020-08-18 LAB — GLUCOSE, CAPILLARY
Glucose-Capillary: 171 mg/dL — ABNORMAL HIGH (ref 70–99)
Glucose-Capillary: 157 mg/dL — ABNORMAL HIGH (ref 70–99)
Glucose-Capillary: 177 mg/dL — ABNORMAL HIGH (ref 70–99)
Glucose-Capillary: 169 mg/dL — ABNORMAL HIGH (ref 70–99)

## 2020-08-18 LAB — CBC
HCT: 39.6 % (ref 39.0–52.0)
Hemoglobin: 12.7 g/dL — ABNORMAL LOW (ref 13.0–17.0)
MCH: 29.9 pg (ref 26.0–34.0)
MCHC: 32.1 g/dL (ref 30.0–36.0)
MCV: 93.2 fL (ref 80.0–100.0)
Platelets: 325 10*3/uL (ref 150–400)
RBC: 4.25 MIL/uL (ref 4.22–5.81)
RDW: 15.9 % — ABNORMAL HIGH (ref 11.5–15.5)
WBC: 18.7 10*3/uL — ABNORMAL HIGH (ref 4.0–10.5)
nRBC: 0.7 % — ABNORMAL HIGH (ref 0.0–0.2)

## 2020-08-18 LAB — MAGNESIUM: Magnesium: 1.8 mg/dL (ref 1.7–2.4)

## 2020-08-18 LAB — COOXEMETRY PANEL
Carboxyhemoglobin: 1.2 % (ref 0.5–1.5)
Carboxyhemoglobin: 1 % (ref 0.5–1.5)
Methemoglobin: 0.8 % (ref 0.0–1.5)
Methemoglobin: 0.9 % (ref 0.0–1.5)
O2 Saturation: 94.7 %
O2 Saturation: 70.6 %
Total hemoglobin: 13 g/dL (ref 12.0–16.0)
Total hemoglobin: 12.3 g/dL (ref 12.0–16.0)

## 2020-08-18 LAB — LACTIC ACID, PLASMA
Lactic Acid, Venous: 1.7 mmol/L (ref 0.5–1.9)
Lactic Acid, Venous: 2.1 mmol/L (ref 0.5–1.9)

## 2020-08-18 LAB — APTT: aPTT: 73 seconds — ABNORMAL HIGH (ref 24–36)

## 2020-08-18 LAB — PROCALCITONIN: Procalcitonin: 1.77 ng/mL

## 2020-08-18 LAB — PHOSPHORUS: Phosphorus: 4 mg/dL (ref 2.5–4.6)

## 2020-08-18 LAB — HEPARIN LEVEL (UNFRACTIONATED): Heparin Unfractionated: 1.07 IU/mL — ABNORMAL HIGH (ref 0.30–0.70)

## 2020-08-18 MED ORDER — HEPARIN SODIUM (PORCINE) 1000 UNIT/ML DIALYSIS
1000.0000 [IU] | INTRAMUSCULAR | Status: DC | PRN
Start: 2020-08-18 — End: 2020-08-20
  Administered 2020-08-18: 2800 [IU] via INTRAVENOUS_CENTRAL
  Administered 2020-08-20: 3000 [IU] via INTRAVENOUS_CENTRAL
  Filled 2020-08-18: qty 6
  Filled 2020-08-18: qty 4
  Filled 2020-08-18: qty 3
  Filled 2020-08-18 (×2): qty 6

## 2020-08-18 MED ORDER — HEPARIN SODIUM (PORCINE) 1000 UNIT/ML DIALYSIS
1000.0000 [IU] | INTRAMUSCULAR | Status: DC | PRN
Start: 2020-08-18 — End: 2020-08-20

## 2020-08-18 MED ORDER — MIDAZOLAM HCL 2 MG/2ML IJ SOLN
0.5000 mg | INTRAMUSCULAR | Status: DC | PRN
  Administered 2020-08-18: 2 mg via INTRAVENOUS
  Filled 2020-08-18: qty 2

## 2020-08-18 MED ORDER — VANCOMYCIN HCL 1250 MG/250ML IV SOLN
1250.0000 mg | INTRAVENOUS | Status: DC
  Administered 2020-08-18 – 2020-08-19 (×2): 1250 mg via INTRAVENOUS
  Filled 2020-08-18 (×4): qty 250

## 2020-08-18 MED ORDER — HEPARIN SODIUM (PORCINE) 1000 UNIT/ML DIALYSIS
1000.0000 [IU] | INTRAMUSCULAR | Status: DC | PRN
  Filled 2020-08-18: qty 6

## 2020-08-18 MED ORDER — FUROSEMIDE 10 MG/ML IJ SOLN
120.0000 mg | Freq: Once | INTRAVENOUS | Status: AC
  Administered 2020-08-18: 120 mg via INTRAVENOUS
  Filled 2020-08-18: qty 10

## 2020-08-18 MED ORDER — HEPARIN (PORCINE) 2000 UNITS/L FOR CRRT: INTRAVENOUS_CENTRAL | Status: DC | PRN

## 2020-08-18 MED ORDER — PRISMASOL BGK 4/2.5 32-4-2.5 MEQ/L REPLACEMENT SOLN: Status: DC

## 2020-08-18 MED ORDER — MAGNESIUM SULFATE 2 GM/50ML IV SOLN
2.0000 g | Freq: Once | INTRAVENOUS | Status: AC
  Administered 2020-08-18: 2 g via INTRAVENOUS
  Filled 2020-08-18: qty 50

## 2020-08-18 MED ORDER — PIPERACILLIN-TAZOBACTAM 3.375 G IVPB
3.3750 g | Freq: Four times a day (QID) | INTRAVENOUS | Status: DC
  Administered 2020-08-18 – 2020-08-20 (×5): 3.375 g via INTRAVENOUS
  Filled 2020-08-18 (×5): qty 50

## 2020-08-18 MED ORDER — PRISMASOL BGK 4/2.5 32-4-2.5 MEQ/L EC SOLN: Status: DC

## 2020-08-18 NOTE — Progress Notes (Signed)
ANTICOAGULATION CONSULT NOTE  Pharmacy Consult for Heparin Indication: atrial fibrillation and LV thrombus  Allergies  Allergen Reactions   Shrimp [Shellfish Allergy] Hives and Itching    Patient Measurements: Height: 5\' 8"  (172.7 cm) Weight: (!) 147.5 kg (325 lb 2.9 oz) IBW/kg (Calculated) : 68.4 Heparin Dosing Weight: 104 kg  Vital Signs: Temp: 99.32 F (37.4 C) (08/06 0600) BP: 96/62 (08/06 0700) Pulse Rate: 103 (08/06 0700)  Labs: Recent Labs    08/17/20 0420 08/17/20 0446 08/17/20 1250 08/17/20 1532 08/17/20 1650 08/17/20 2313 08/18/20 0423 08/18/20 0429  HGB 12.7*   < >  --   --  13.3 14.6 12.7* 13.6  HCT 37.8*   < >  --   --  40.5 43.0 39.6 40.0  PLT 325  --   --   --  350  --  325  --   APTT 49*  --  63*  --   --   --  73*  --   HEPARINUNFRC >1.10*  --   --   --   --   --  1.07*  --   CREATININE 3.60*  --   --  3.61*  --   --  3.93*  --    < > = values in this interval not displayed.     Estimated Creatinine Clearance: 30.4 mL/min (A) (by C-G formula based on SCr of 3.93 mg/dL (H)).   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   Medtronic ICD   Arthritis    knee   Chronic combined systolic and diastolic CHF (congestive heart failure) (HCC)    Chronic venous insufficiency    Coronary artery disease    Deep vein thrombosis (HCC) 2007   left leg   Diverticulitis    c/b perforated colon and nonhealing abdominal wound s/p skin grafting, ileostomy   Hepatitis    patient denies this dx   Lupus anticoagulant positive    Morbid obesity (HCC)    Morbid obesity with BMI of 50.0-59.9, adult (HCC)    Mural thrombus of heart    coumadin   Nonischemic cardiomyopathy (HCC)    a) 12/17/11 echo: LVEF 25-30%, grade 3 diastolic dysfunction (c/w restriction), mod MR, mod LA/LV and mild RA dilatation; b) 12/18/11 cMRI: LVEF 38%, mod LV/mild RV dilatation, global HK, mild-mod RV sys dysfxn, no  LV thrombus & patchy non-subendocardial delayed enhancement c/w infil dz or prior myocarditis; c. 07/2018 EF 25-30%.   Paroxysmal SVT (supraventricular tachycardia) (HCC)    Pneumonia    x 2   Pulmonary embolism (HCC)    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   Ventricular tachyarrhythmia [facility])    Prior VT/VF   Wears glasses    Wound of abdomen     Medications:  Infusions:   sodium chloride Stopped (08/17/20 2155)   amiodarone 30 mg/hr (08/18/20 0600)   feeding supplement (VITAL 1.5 CAL) Stopped (08/17/20 1502)   fentaNYL infusion INTRAVENOUS 150 mcg/hr (08/18/20 0600)   heparin 2,450 Units/hr (08/18/20 0600)   milrinone 0.375 mcg/kg/min (08/18/20 0600)   norepinephrine (LEVOPHED) Adult infusion 56 mcg/min (08/18/20 0646)   piperacillin-tazobactam (ZOSYN)  IV 12.5 mL/hr at 08/18/20 0600   propofol (DIPRIVAN) infusion 20 mcg/kg/min (08/18/20 0635)   vasopressin 0.03 Units/min (08/18/20 0600)    Assessment: 54 YOM on Apixaban PTA for hx LV thrombus/Afib admitted since 7/22 and with worsening AoCHF exacerbation and renal function - s/p RHC 8/4 with  resp distress requiring intubation. Pharmacy consulted to hold Apixaban and start Heparin for anticoagulation.  Heparin drip 2450 uts/hr aptt 73 sec at goal CBC ok no change Using aptt to dose heparin for now as apixaban falsely elevates heparin level   Goal of Therapy:  Heparin level 0.3-0.7 units/ml aPTT 66-102 seconds Monitor platelets by anticoagulation protocol: Yes   Plan:  -Continue heparin  2450 units/h -Recheck heparin level and aptt with am labs  [employer] Pharm.D. CPP, BCPS Clinical Pharmacist [phone] 08/18/2020 7:48 AM   Please check AMION for all [facility] Pharmacy numbers 08/18/2020

## 2020-08-18 NOTE — Progress Notes (Signed)
NAME:  Roberto [name], MRN:  [id number], DOB:  03-13-1965, LOS: 14 ADMISSION DATE:  07/21/2020 CONSULTATION DATE:  08/15/2020 REFERRING MD:  Bensimhon CHIEF COMPLAINT:  Acute respiratory failure   History of Present Illness:  55 year old man with PMHx significant for acute-on-chronic combined CHF, NICM, CAD, history of VT (s/p AICD placement 08/2018), Aflutter (on Eliquis), DVT/PE (LLE, provoked 2007), CKD stage IIIb (baseline ~2.0), diverticulitis (c/b perforated colon/nonhealing abdominal wound requiring skin grafting and ileostomy) who presented to [facility] 7/22 for RLE cellulitis.   Found to be in cardiogenic versus septic shock in the setting of decompensated HF and cellulitis. Advanced HF team was consulted and patient was started on milrinone and IV diuresis without significant improvement. Patient continued to have worsening cardiorenal syndrome with worsening renal failure and underwent RHC and Swan placement 8/4. During procedure, patient's respiratory status decompensated and patient was intubated. Transferred to [facility] CVICU.  PCCM consulted for management of respiratory failure and ICU admission in the setting of decompensated HF and cardiorenal syndrome.  Pertinent Medical History:   Past Medical History:  Diagnosis Date   AICD (automatic cardioverter/defibrillator) present 08/2018   Medtronic ICD   Arthritis    knee   Chronic combined systolic and diastolic CHF (congestive heart failure) (HCC)    Chronic venous insufficiency    Coronary artery disease    Deep vein thrombosis (HCC) 2007   left leg   Diverticulitis    c/b perforated colon and nonhealing abdominal wound s/p skin grafting, ileostomy   Hepatitis    patient denies this dx   Lupus anticoagulant positive    Morbid obesity (HCC)    Morbid obesity with BMI of 50.0-59.9, adult (HCC)    Mural thrombus of heart    coumadin   Nonischemic cardiomyopathy (HCC)    a) 12/17/11 echo: LVEF 25-30%, grade 3 diastolic dysfunction (c/w  restriction), mod MR, mod LA/LV and mild RA dilatation; b) 12/18/11 cMRI: LVEF 38%, mod LV/mild RV dilatation, global HK, mild-mod RV sys dysfxn, no LV thrombus & patchy non-subendocardial delayed enhancement c/w infil dz or prior myocarditis; c. 07/2018 EF 25-30%.   Paroxysmal SVT (supraventricular tachycardia) (HCC)    Pneumonia    x 2   Pulmonary embolism (HCC)    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   Ventricular tachyarrhythmia [facility])    Prior VT/VF   Wears glasses    Wound of abdomen    Significant Hospital Events: Including procedures, antibiotic start and stop dates in addition to other pertinent events   7/22 Admitted for cardiogenic vs. Septic shock with decompensated HF and RLE cellulitis 8/4 RHC + Swan placement. Little improvement in clinical status despite milrinone and diuresis. PCCM consulted for worsening respiratory failure intraprocedure. Intubated. Zosyn started for possible HCAP  8/5 hemodynamics improved. Still w/ pulm edema but CXR improving. Continuing preload reduction/and inotrope support. Starting tubefeeds.  Unfortunately patient worsened yesterday evening.  Increased pressor requirements.  Low-grade temperatures.  Concern for worsening sepsis. 8/6 critically ill, increasing pressor requirements.  Worsening kidney function less urine output  Interim History / Subjective:   Tachycardic, low-grade fevers, worsening urine output, critically ill intubated on mechanical life support.  Objective:  Blood pressure (!) 100/57, pulse (!) 104, temperature 99.32 F (37.4 C), resp. rate (!) 24, height 5\' 8"  (1.727 m), weight (!) 147.5 kg, SpO2 92 %. PAP: (40-92)/(22-41) 59/37 CVP:  [2 mmHg-48 mmHg] 18 mmHg PCWP:  [31 mmHg-41 mmHg] 37 mmHg CO:  [4.9 L/min-7.5 L/min] 7.5 L/min CI:  [  1.9 L/min/m2-3 L/min/m2] 3 L/min/m2  Vent Mode: PRVC FiO2 (%):  [40 %-50 %] 50 % Set Rate:  [18 bmp-28 bmp] 24 bmp Vt Set:  [550 mL] 550 mL PEEP:  [10 cmH20] 10 cmH20 Plateau Pressure:  [23  cmH20-30 cmH20] 30 cmH20   Intake/Output Summary (Last 24 hours) at 08/18/2020 0729 Last data filed at 08/18/2020 0600 Gross per 24 hour  Intake 4451.02 ml  Output 1735 ml  Net 2716.02 ml   Filed Weights   Sep 04, 2020 0405 08/17/20 0457 08/18/20 0500  Weight: (!) 148.6 kg (!) 137.2 kg (!) 147.5 kg   Physical Examination:  General obese male intubated on mechanical life support critically ill HENT NCAT, right IJ in place, ET tube in place Pulm bilateral ventilated breath sounds Heart: Regular rhythm S1-S2 Abd soft nontender nondistended Ext bilateral lower extremity edema, wraps in place GU Foley in place with clear urine Neuro alert following commands.  Resolved Hospital Problem List   Sepsis secondary to RLE cellulitis s/p 14 d zyvox.   Assessment & Plan:   Acute hypoxic respiratory failure in the setting of decompensated HF W/ diffuse pulmonary edema  R/o HCAP/aspiration  Pcxr personally reviewed: improved aeration. Tubes/lines are in satisfactory position.  Plan Continue full mechanical vent support PAD guidelines sedation VAP bundle Diuresis held by cardiology Continue Zosyn plus vancomycin  Acute on chronic combined CHF w/ cardiogenic shock  Nonischemic cardiomyopathy  Echo 08/14/2020 with EF 20%, grade III diastolic dysfunction.  Not candidate for advanced HF interventions d/t body habitus  Plan Titrate vasopressors to maintain mean arterial pressure than 65 Discussed situation with advanced heart failure service. Right now continuing max support. Overall prognosis is poor  Atrial flutter, s/p ablation History of VT, s/p AICD placement History of PE/LLE DVT - LE dopplers negative for DVT Plan IV heparin Continue amiodarone  Acute renal failure on chronic CKD stage III Concern for cardiorenal syndrome -Baseline Cr 1.9-2.6, worsening over the last several months. Cr on PCCM consult ~3.0.  -scr improved w/ preload reduction and inotropes.  Plan Follow urine  output Follow basic metabolic panel No acute indication for CVVHD at this time  Acid base and Fluid and electrolyte imbalance: NAG metabolic acidosis, hypokalemia and hyponatremia  Plan Replace electrolytes as needed  Hyperglycemia Plan SSI  GOC - Not a candidate for advanced HF therapies due to body habitus and ileostomy status - Per HF team note, patient has spoken with palliative care previously and at present does not wish to reevaluate his decision for full code status/aggressive care -Continue goals of care discussion and potential need for palliation in the future.  Best Practice (right click and "Reselect all SmartList Selections" daily)   Diet/type: tubefeeds DVT prophylaxis: systemic heparin GI prophylaxis: PPI Lines: Central line Foley:  Yes, and it is still needed Code Status:  full code Last date of multidisciplinary goals of care discussion [see above ]   This patient is critically ill with multiple organ system failure; which, requires frequent high complexity decision making, assessment, support, evaluation, and titration of therapies. This was completed through the application of advanced monitoring technologies and extensive interpretation of multiple databases. During this encounter critical care time was devoted to patient care services described in this note for 34 minutes.  Josephine Igo, DO [facility] 08/18/2020 7:30 AM

## 2020-08-18 NOTE — Progress Notes (Signed)
Pharmacy Antibiotic Note  Roberto [name] is a 55 y.o. male admitted on 08/15/20 with SOB and cardiogenic shock now with concern for sepsis.  Pharmacy has been consulted for vancomycin dosing.  Patient is now starting on CRRT. Leukocytosis unchanged. LA 2.1. AF   Plan: -Change Zosyn to 3.375 gm IV Q 6 hours -Start vancomycin 1250 mg IV Q 24 hours while on CRRT   -Monitor CBC, renal fx, cultures and clinical progress -F/u vanc levels as steady state   Height: 5\' 8"  (172.7 cm) Weight: (!) 147.5 kg (325 lb 2.9 oz) IBW/kg (Calculated) : 68.4  Temp (24hrs), Avg:99.7 F (37.6 C), Min:98.78 F (37.1 C), Max:100.04 F (37.8 C)  Recent Labs  Lab 08/12/20 1750 08/13/20 0232 08/14/20 0415 09/08/2020 0646 09/01/2020 2050 08/17/20 0420 08/17/20 1532 08/17/20 1650 08/17/20 2307 08/18/20 0423 08/18/20 1309  WBC  --  14.7*   < > 15.1* 16.7* 15.8*  --  18.4*  --  18.7*  --   CREATININE 3.07* 3.06*   < > 3.76* 3.74* 3.60* 3.61*  --   --  3.93* 4.22*  LATICACIDVEN 1.5 1.4  --   --   --   --   --   --  1.7 2.1*  --    < > = values in this interval not displayed.     Estimated Creatinine Clearance: 28.3 mL/min (A) (by C-G formula based on SCr of 4.22 mg/dL (H)).    Allergies  Allergen Reactions   Shrimp [Shellfish Allergy] Hives and Itching    Antimicrobials this admission: Vanc 7/23 >>7/29 Cefepime 7/23 >>7/29 Zyvox 7/29>8/1 Zosyn 8/4 >>   Dose adjustments this admission:   Microbiology results: 8/5 BCx >> ngtd 8/4 TA >> ngtd   Thank you for allowing pharmacy to be a part of this patient's care.  10/4, PharmD., BCPS, BCCCP Clinical Pharmacist Please refer to [facility] for unit-specific pharmacist

## 2020-08-18 NOTE — Plan of Care (Signed)
  Problem: Education: Goal: Knowledge of General Education information will improve Description: Including pain rating scale, medication(s)/side effects and non-pharmacologic comfort measures Outcome: Progressing   Problem: Health Behavior/Discharge Planning: Goal: Ability to manage health-related needs will improve Outcome: Progressing   Problem: Clinical Measurements: Goal: Ability to maintain clinical measurements within normal limits will improve Outcome: Progressing Goal: Will remain free from infection Outcome: Progressing Goal: Diagnostic test results will improve Outcome: Progressing Goal: Respiratory complications will improve Outcome: Progressing Goal: Cardiovascular complication will be avoided Outcome: Progressing   Problem: Activity: Goal: Risk for activity intolerance will decrease Outcome: Progressing   Problem: Nutrition: Goal: Adequate nutrition will be maintained Outcome: Progressing   Problem: Coping: Goal: Level of anxiety will decrease Outcome: Progressing   Problem: Elimination: Goal: Will not experience complications related to bowel motility Outcome: Progressing Goal: Will not experience complications related to urinary retention Outcome: Progressing   Problem: Pain Managment: Goal: General experience of comfort will improve Outcome: Progressing   Problem: Safety: Goal: Ability to remain free from injury will improve Outcome: Progressing   Problem: Skin Integrity: Goal: Risk for impaired skin integrity will decrease Outcome: Progressing   Problem: Education: Goal: Ability to demonstrate management of disease process will improve Outcome: Progressing Goal: Ability to verbalize understanding of medication therapies will improve Outcome: Progressing Goal: Individualized Educational Video(s) Outcome: Progressing   Problem: Activity: Goal: Capacity to carry out activities will improve Outcome: Progressing   Problem: Cardiac: Goal:  Ability to achieve and maintain adequate cardiopulmonary perfusion will improve Outcome: Progressing   Problem: Activity: Goal: Ability to tolerate increased activity will improve Outcome: Progressing   Problem: Respiratory: Goal: Ability to maintain a clear airway and adequate ventilation will improve Outcome: Progressing   Problem: Role Relationship: Goal: Method of communication will improve Outcome: Progressing   

## 2020-08-18 NOTE — Consult Note (Signed)
Reason for Consult: Acute kidney injury on chronic kidney disease stage IIIb, volume overload Referring Physician: June Leap, DO (CCM)  HPI: 55 year old African-American man with past medical history significant for chronic combined systolic/diastolic congestive heart failure (EF 25-30%), morbid obesity, history of PE, coronary artery disease, history of ventricular tachycardia status post ICD placement, atrial flutter on anticoagulation with Eliquis, severe diverticulitis with colon perforation status post ileostomy and chronic kidney disease stage III with a baseline creatinine around 2.0.  He was admitted to the hospital with decompensated congestive heart failure versus septic shock started on inotropic support, pressors and diuresis that have been decreasingly successful with volume removal in the setting of worsening renal function.  He was intubated on 8/4 for worsening respiratory failure and creatinine has been creeping up now to 4.2 worsening metabolic acidosis/azotemia and persistent hyponatremia.  Past Medical History:  Diagnosis Date   AICD (automatic cardioverter/defibrillator) present 08/2018   Medtronic ICD   Arthritis    knee   Chronic combined systolic and diastolic CHF (congestive heart failure) (HCC)    Chronic venous insufficiency    Coronary artery disease    Deep vein thrombosis ([place]) 2007   left leg   Diverticulitis    c/b perforated colon and nonhealing abdominal wound s/p skin grafting, ileostomy   Hepatitis    patient denies this dx   Lupus anticoagulant positive    Morbid obesity ([place])    Morbid obesity with BMI of 50.0-59.9, adult ([place])    Mural thrombus of heart    coumadin   Nonischemic cardiomyopathy ([place])    a) 12/17/11 echo: LVEF 09-32%, grade 3 diastolic dysfunction (c/w restriction), mod MR, mod LA/LV and mild RA dilatation; b) 12/18/11 cMRI: LVEF 38%, mod LV/mild RV dilatation, global HK, mild-mod RV sys dysfxn, no LV thrombus & patchy  non-subendocardial delayed enhancement c/w infil dz or prior myocarditis; c. 07/2018 EF 25-30%.   Paroxysmal SVT (supraventricular tachycardia) (HCC)    Pneumonia    x 2   Pulmonary embolism (HCC)    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   Ventricular tachyarrhythmia [facility])    Prior VT/VF   Wears glasses    Wound of abdomen     Past Surgical History:  Procedure Laterality Date   A-FLUTTER ABLATION N/A 11/17/2016   Procedure: A-FLUTTER ABLATION;  Surgeon: Constance Haw, MD;  Location: [place] [facility];  Service: Cardiovascular;  Laterality: N/A;   APPLICATION OF WOUND VAC N/A 09/05/2019   Procedure: APPLICATION OF WOUND VAC;  Surgeon: Cindra Presume, MD;  Location: [place];  Service: Plastics;  Laterality: N/A;  PATIENT'S HOME WOUND VAC APPLIED.   BIOPSY  12/23/2018   Procedure: BIOPSY;  Surgeon: Rush Landmark Telford Nab., MD;  Location: [place];  Service: Gastroenterology;;   BIOPSY  01/23/2020   Procedure: BIOPSY;  Surgeon: Irving Copas., MD;  Location: [place];  Service: Gastroenterology;;   CARDIAC CATHETERIZATION  06/2006   Angiographically normal cors   CARDIAC CATHETERIZATION  12/22/2011   R/LHC: normal cors, well-compensated HDs, LV dysfxn   CARDIOVERSION N/A 10/17/2016   Procedure: CARDIOVERSION;  Surgeon: Jolaine Artist, MD;  Location: [place];  Service: Cardiovascular;  Laterality: N/A;   CARDIOVERSION N/A 03/20/2017   Procedure: CARDIOVERSION;  Surgeon: Jolaine Artist, MD;  Location: [facility] ENDOSCOPY;  Service: Cardiovascular;  Laterality: N/A;   COLECTOMY N/A 12/28/2018   Procedure: TOTAL COLECTOMY;  Surgeon: Rolm Bookbinder, MD;  Location: [place];  Service: General;  Laterality: N/A;  FLEXIBLE SIGMOIDOSCOPY N/A 12/23/2018   Procedure: FLEXIBLE SIGMOIDOSCOPY;  Surgeon: Rush Landmark Telford Nab., MD;  Location: [place];  Service: Gastroenterology;  Laterality: N/A;   FLEXIBLE SIGMOIDOSCOPY N/A 01/23/2020   Procedure: FLEXIBLE  SIGMOIDOSCOPY;  Surgeon: Rush Landmark Telford Nab., MD;  Location: [place];  Service: Gastroenterology;  Laterality: N/A;   ICD IMPLANT N/A 09/03/2018   Procedure: ICD Medtronic IMPLANT;  Surgeon: Constance Haw, MD;  Location: [place] [facility];  Service: Cardiovascular;  Laterality: N/A;   ILEOSTOMY N/A 12/28/2018   Procedure: ILEOSTOMY;  Surgeon: Rolm Bookbinder, MD;  Location: [place];  Service: General;  Laterality: N/A;   IR RADIOLOGIST EVAL & MGMT  02/08/2019   JOINT REPLACEMENT     right knee   LEFT AND RIGHT HEART CATHETERIZATION WITH CORONARY ANGIOGRAM N/A 12/22/2011   Procedure: LEFT AND RIGHT HEART CATHETERIZATION WITH CORONARY ANGIOGRAM;  Surgeon: Jolaine Artist, MD;  Location: [facility] CATH LAB;  Service: Cardiovascular;  Laterality: N/A;   PATELLAR TENDON REPAIR     Left   RIGHT HEART CATH N/A 10/20/2016   Procedure: RIGHT HEART CATH;  Surgeon: Jolaine Artist, MD;  Location: [place] [facility];  Service: Cardiovascular;  Laterality: N/A;   RIGHT HEART CATH N/A 04/17/2020   Procedure: RIGHT HEART CATH;  Surgeon: Jolaine Artist, MD;  Location: [place] [facility];  Service: Cardiovascular;  Laterality: N/A;   RIGHT HEART CATH N/A 08/13/2020   Procedure: RIGHT HEART CATH;  Surgeon: Jolaine Artist, MD;  Location: [place] [facility];  Service: Cardiovascular;  Laterality: N/A;   SKIN SPLIT GRAFT N/A 09/05/2019   Procedure: SKIN GRAFT SPLIT THICKNESS;  Surgeon: Cindra Presume, MD;  Location: [place];  Service: Plastics;  Laterality: N/A;   TEE WITHOUT CARDIOVERSION N/A 03/20/2017   Procedure: TRANSESOPHAGEAL ECHOCARDIOGRAM (TEE);  Surgeon: Jolaine Artist, MD;  Location: [facility] ENDOSCOPY;  Service: Cardiovascular;  Laterality: N/A;    Family History  Problem Relation Age of Onset   Hypertension Mother    Clotting disorder Mother        mom with PEs   Cancer Mother        uterine cancer   Diabetes Father    Heart attack Father    Hypertension Other        Sibling    Diabetes Other    Obesity Other    Diabetes Sister    Obesity Sister    Obesity Sister    Colon cancer Neg Hx    Stomach cancer Neg Hx    Esophageal cancer Neg Hx    Rectal cancer Neg Hx    Inflammatory bowel disease Neg Hx    Liver disease Neg Hx    Pancreatic cancer Neg Hx     Social History:  reports that he has never smoked. He has never used smokeless tobacco. He reports previous alcohol use. He reports that he does not use drugs.  Allergies:  Allergies  Allergen Reactions   Shrimp [Shellfish Allergy] Hives and Itching    Medications: I have reviewed the patient's current medications. Scheduled:  alteplase  2 mg Intracatheter Once   chlorhexidine gluconate (MEDLINE KIT)  15 mL Mouth Rinse BID   Chlorhexidine Gluconate Cloth  6 each Topical Daily   docusate  100 mg Per Tube BID   feeding supplement (PROSource TF)  45 mL Per Tube QID   fentaNYL (SUBLIMAZE) injection  50 mcg Intravenous Once   insulin aspart  0-15 Units Subcutaneous Q4H  mouth rinse  15 mL Mouth Rinse 10 times per day   mexiletine  300 mg Per Tube Q12H   midodrine  10 mg Per Tube TID WC   mupirocin ointment  1 application Nasal BID   pantoprazole sodium  40 mg Per Tube Daily   polyethylene glycol  17 g Per Tube Daily   sodium chloride flush  10-40 mL Intracatheter Q12H   sodium chloride flush  10-40 mL Intracatheter Q12H   vancomycin variable dose per unstable renal function (pharmacist dosing)   Does not apply See admin instructions   Continuous:  sodium chloride Stopped (08/17/20 2155)   amiodarone 30 mg/hr (08/18/20 1300)   feeding supplement (VITAL 1.5 CAL) Stopped (08/17/20 1502)   fentaNYL infusion INTRAVENOUS 175 mcg/hr (08/18/20 1431)   heparin 2,450 Units/hr (08/18/20 1300)   milrinone 0.25 mcg/kg/min (08/18/20 1300)   norepinephrine (LEVOPHED) Adult infusion 64 mcg/min (08/18/20 1300)   piperacillin-tazobactam (ZOSYN)  IV 3.375 g (08/18/20 1428)   propofol (DIPRIVAN) infusion 20  mcg/kg/min (08/18/20 0800)   vasopressin 0.04 Units/min (08/18/20 1300)   BMP Latest Ref Rng & Units 08/18/2020 08/18/2020 08/18/2020  Glucose 70 - 99 mg/dL 178(H) - 167(H)  BUN 6 - 20 mg/dL 57(H) - 56(H)  Creatinine 0.61 - 1.24 mg/dL 4.22(H) - 3.93(H)  BUN/Creat Ratio 9 - 20 - - -  Sodium 135 - 145 mmol/L 130(L) 135 129(L)  Potassium 3.5 - 5.1 mmol/L 4.3 3.8 3.9  Chloride 98 - 111 mmol/L 103 - 101  CO2 22 - 32 mmol/L 15(L) - 15(L)  Calcium 8.9 - 10.3 mg/dL 8.2(L) - 8.2(L)   CBC Latest Ref Rng & Units 08/18/2020 08/18/2020 08/17/2020  WBC 4.0 - 10.5 K/uL - 18.7(H) -  Hemoglobin 13.0 - 17.0 g/dL 13.6 12.7(L) 14.6  Hematocrit 39.0 - 52.0 % 40.0 39.6 43.0  Platelets 150 - 400 K/uL - 325 -   Urinalysis    Component Value Date/Time   COLORURINE YELLOW 09/12/2020 1518   APPEARANCEUR HAZY (A) 08/22/2020 1518   LABSPEC 1.014 09/04/2020 1518   LABSPEC 1.025 03/18/2017 1337   PHURINE 5.0 09/07/2020 1518   GLUCOSEU NEGATIVE 08/29/2020 1518   HGBUR NEGATIVE 09/01/2020 1518   BILIRUBINUR NEGATIVE 08/24/2020 1518   BILIRUBINUR negative 03/18/2017 1337   BILIRUBINUR neg 12/17/2015 [place] 09/10/2020 1518   PROTEINUR 100 (A) 08/15/2020 1518   UROBILINOGEN negative 12/17/2015 0944   UROBILINOGEN 0.2 12/16/2011 1208   NITRITE NEGATIVE 08/23/2020 1518   LEUKOCYTESUR NEGATIVE 08/18/2020 1518      CARDIAC CATHETERIZATION  Result Date: 08/28/2020 Findings: On milrinone 0.375 mcg/kg/min RA = 9 RV = 53/11 PA = 60/29 (42) PCW = 35 (v = 39) Fick cardiac output/index = 3.8/1.5 Ao sat = 90% PA sat = 42% Assessment: Cardiogenic shock with markedly elevated filling pressures Plan/Discussion: Move to ICU. Start NE and lasix gtt. May need intubation and CVVHD Glori Bickers, MD 11:05 PM   DG Chest Port 1 View  Result Date: 08/18/2020 CLINICAL DATA:  55 year old male with history of respiratory failure. EXAM: PORTABLE CHEST 1 VIEW COMPARISON:  Chest x-ray 08/17/2020. FINDINGS: An endotracheal  tube is in place with tip 3.5 cm above the carina. Right IJ Cordis through which a Swan-Ganz catheter has been passed into a descending right pulmonary artery branch. Left-sided pacemaker/AICD with lead tip projecting over the expected location of the right ventricular apex. A feeding tube is seen extending into the abdomen, however, the tip of the feeding tube extends below the  lower margin of the image. There is cephalization of the pulmonary vasculature, indistinctness of the interstitial markings, and patchy airspace disease throughout the lungs bilaterally suggestive of moderate pulmonary edema. Small right pleural effusion. Heart size is moderately enlarged. Upper mediastinal contours are within normal limits allowing for patient positioning. IMPRESSION: 1. Support apparatus, as above. 2. The appearance the chest is favored to reflect congestive heart failure, as above. 3. Small right pleural effusion. Electronically Signed   By: Vinnie Langton M.D.   On: 08/18/2020 08:51   DG Chest Port 1 View  Result Date: 08/17/2020 CLINICAL DATA:  Endotracheal tube EXAM: PORTABLE CHEST 1 VIEW COMPARISON:  Radiograph 08/13/2020, chest CT 03/21/2017 FINDINGS: Endotracheal tube tip overlies the midthoracic trachea. Pulmonary artery catheter tip overlies the right inter lobar artery. Unchanged AICD lead. There is a nasogastric tube which passes below the diaphragm, side port overlying the stomach. Unchanged, enlarged cardiac silhouette. There is diffuse airspace disease bilaterally, minimally improved from prior exam. No large pleural effusion or visible pneumothorax. IMPRESSION: Endotracheal tube overlies the midthoracic trachea. Diffuse airspace disease bilaterally, minimally improved from prior exam. Electronically Signed   By: Maurine Simmering   On: 08/17/2020 08:31   DG CHEST PORT 1 VIEW  Result Date: 08/25/2020 CLINICAL DATA:  Intubated, enteric catheter placement EXAM: PORTABLE CHEST 1 VIEW COMPARISON:  08/10/2020  FINDINGS: Single frontal view of the chest demonstrates endotracheal tube overlying tracheal air column tip midway between thoracic inlet and carina. Enteric catheter passes below diaphragm tip excluded by collimation. There is a right internal jugular flow directed central venous catheter tip overlying the right hilum. Right-sided PICC overlies superior vena cava. Single lead AICD unchanged. Cardiac silhouette is enlarged. There is widespread bilateral airspace disease, right greater than left. Small bilateral effusions. No pneumothorax. IMPRESSION: 1. Support devices as above. 2. Widespread bilateral airspace disease consistent with edema or infection. Electronically Signed   By: Randa Ngo M.D.   On: 09/07/2020 19:14   DG Abd Portable 1V  Result Date: 08/17/2020 CLINICAL DATA:  Feeding tube placement EXAM: PORTABLE ABDOMEN - 1 VIEW COMPARISON:  None. FINDINGS: Feeding tube tip is in the distal stomach. Nonobstructive bowel gas pattern. Mild gaseous distention of the stomach. IMPRESSION: Feeding tube tip in the distal stomach. Electronically Signed   By: Rolm Baptise M.D.   On: 08/17/2020 17:08   DG Abd Portable 1V  Result Date: 09/08/2020 CLINICAL DATA:  Enteric catheter placement EXAM: PORTABLE ABDOMEN - 1 VIEW COMPARISON:  01/02/2019 FINDINGS: Frontal view of the lower chest and upper abdomen demonstrates an enteric catheter tip and side port projecting over the gastric body. Paucity of bowel gas. No evidence of obstruction. Bibasilar airspace disease and small effusions, right greater than left. IMPRESSION: 1. Enteric catheter overlying gastric body. Electronically Signed   By: Randa Ngo M.D.   On: 09/01/2020 19:15    Review of Systems  Unable to perform ROS: Intubated  Blood pressure (!) 83/55, pulse (!) 106, temperature 99.86 F (37.7 C), resp. rate (!) 21, height '5\' 8"'  (1.727 m), weight (!) 147.5 kg, SpO2 94 %. Physical Exam Vitals and nursing note reviewed.  Constitutional:       Appearance: He is well-developed. He is obese. He is not toxic-appearing.     Comments: Awakens to calling out his name  HENT:     Head: Normocephalic and atraumatic.     Mouth/Throat:     Comments: Intubated Neck:     Vascular: JVD present.  Cardiovascular:  Rate and Rhythm: Normal rate and regular rhythm.     Heart sounds: No murmur heard.   No gallop.  Pulmonary:     Effort: No tachypnea.     Breath sounds: Examination of the right-lower field reveals decreased breath sounds. Examination of the left-lower field reveals decreased breath sounds. Decreased breath sounds present.  Chest:     Chest wall: No edema.  Abdominal:     General: Bowel sounds are normal.     Palpations: Abdomen is soft. There is no mass.     Tenderness: There is no abdominal tenderness.     Comments: Ileostomy site with intact bag  Musculoskeletal:        General: Normal range of motion.     Cervical back: Normal range of motion and neck supple.     Right lower leg: Edema present.     Left lower leg: Edema present.     Comments: 2+ anasarca  Skin:    General: Skin is warm and dry.     Coloration: Skin is pale.     Comments: Multiple tattoos  Neurological:     Mental Status: He is alert.    Assessment/Plan: 1.  Acute kidney injury on chronic kidney disease stage IIIb: The available history/timeline of events points to ATN likely associated with septic/cardiogenic shock possibly with an underlying component of chronic cardiorenal syndrome.  Unfortunately, he has significant volume overload with respiratory failure and hypotension in the setting of worsening renal function which will likely require initiation of renal replacement therapy in the near future.  Labs at this time do not indicate acute need for dialysis however, I appreciate assistance from critical care service with placement of a dialysis catheter anticipating the need for this in the next 24 hours or so. Avoid nephrotoxic medications  including NSAIDs and iodinated intravenous contrast exposure unless the latter is absolutely indicated.  Preferred narcotic agents for pain control are hydromorphone, fentanyl, and methadone. Morphine should not be used. Avoid Baclofen and avoid oral sodium phosphate and magnesium citrate based laxatives / bowel preps. Continue strict Input and Output monitoring. Will monitor the patient closely with you and intervene or adjust therapy as indicated by changes in clinical status/labs. 2.  Acute on chronic combined congestive heart failure: With cardiogenic and possibly septic shock.  Not a candidate for advanced congestive heart failure management options due to his body habitus and currently on pressors/inotropic support with poor response to diuretics noted.  Will likely need extracorporeal volume unloading if urine output does not improve in the next 24 hours.  Overall prognosis poor. 3.  Acute hypoxic respiratory failure: Secondary to decompensated congestive heart failure/pulmonary edema.  On broad-spectrum antibiotic therapy as efforts continued for volume unloading-likely to need CRRT. 4.  Atrial flutter: On heparin drip and amiodarone, currently rate controlled 5.  Hyponatremia: Secondary to CHF exacerbation and acute kidney injury with impaired free water handling-monitor with diuresis.   Keefe Zawistowski K. 08/18/2020, 2:20 PM

## 2020-08-18 NOTE — Progress Notes (Signed)
Patient ID: Roberto [name], male   DOB: 13-Nov-1965, 55 y.o.   MRN: [id number]     Advanced Heart Failure Rounding Note  PCP-Cardiologist: Glori Bickers, MD   Subjective:    He acutely decompensated yesterday afternoon after conversion to ST/aflutter with RVR.  Amiodarone bolus given with subsequent load.  He then became more hypotensive and required escalating doses of NE.  Vasopressin started after SVR found to be 825.    Lasix drip stopped due to hypotension.  UOP subsequently decreased significantly with only 310 mL noted overnight.  Renal function slightly worse with cr of 3.9 from 3.6 this morning.    There is concern for septic shock -- lactic this AM is 2.1, PCT 1.77 and his WBC rose to 18.  The antibiotic regimen was broadened on 8/5 to include vancomycin.  SVR low, CO high.  This AM, NE at 58 and vasopressin at 0.03.  Milrinone at 0.375.  Co-ox of 70.    Hemodynamics:  CVP 15 PAP: 58/31 PCWP: 31 CO: 6.6 CI: 2.6   SVR: 581 PAPi: 1.8  He is intubated and sedated, but wakes up and follows commands.  Objective:   Weight Range: (!) 147.5 kg Body mass index is 49.44 kg/m.   Vital Signs:   Temp:  [97.34 F (36.3 C)-99.5 F (37.5 C)] 99.32 F (37.4 C) (08/06 0600) Pulse Rate:  [88-127] 102 (08/06 0753) Resp:  [16-36] 25 (08/06 0753) BP: (87-118)/(49-100) 96/62 (08/06 0700) SpO2:  [89 %-97 %] 93 % (08/06 0753) Arterial Line BP: (86-126)/(40-63) 100/55 (08/06 0600) FiO2 (%):  [40 %-50 %] 50 % (08/06 0753) Weight:  [147.5 kg] 147.5 kg (08/06 0500) Last BM Date: 08/18/20  Weight change: Filed Weights   08/21/2020 0405 08/17/20 0457 08/18/20 0500  Weight: (!) 148.6 kg (!) 137.2 kg (!) 147.5 kg    Intake/Output:   Intake/Output Summary (Last 24 hours) at 08/18/2020 0820 Last data filed at 08/18/2020 0800 Gross per 24 hour  Intake 4252.99 ml  Output 1530 ml  Net 2722.99 ml       Physical Exam  CVP 15 General:  Intubated and sedated.  MAE/FC  HEENT: ETT/OGT.   Normocephalic  Neck: supple. JVP difficult to assess due to RIJ CVC.  Carotids 2+ bilat; no bruits. No lymphadenopathy or thryomegaly appreciated. Cor: PMI nondisplaced. Regular rate, tachycardic rhythm. No rubs, gallops or murmurs. Lungs: Diminished throughout Abdomen: obese, soft, nontender, nondistended. No hepatosplenomegaly. No bruits or masses. Good bowel sounds.+ ileostomy  Extremities: no cyanosis, clubbing, rash, 2+ bilateral LE edema, ACE wraps on, RUE PICC  Neuro: Intubated and sedated.  MAE/FC   Telemetry   ST/afl in 100s (personally reviewed)   Labs    CBC Recent Labs    08/17/20 0420 08/17/20 0446 08/17/20 1650 08/17/20 2313 08/18/20 0423 08/18/20 0429  WBC 15.8*  --  18.4*  --  18.7*  --   NEUTROABS 11.0*  --  12.4*  --   --   --   HGB 12.7*   < > 13.3   < > 12.7* 13.6  HCT 37.8*   < > 40.5   < > 39.6 40.0  MCV 89.6  --  91.4  --  93.2  --   PLT 325  --  350  --  325  --    < > = values in this interval not displayed.    Basic Metabolic Panel Recent Labs    08/17/20 1532 08/17/20 2313 08/18/20 0423 08/18/20 0429  NA  132*   < > 129* 135  K 4.2   < > 3.9 3.8  CL 102  --  101  --   CO2 17*  --  15*  --   GLUCOSE 182*  --  167*  --   BUN 53*  --  56*  --   CREATININE 3.61*  --  3.93*  --   CALCIUM 8.5*  --  8.2*  --   MG 1.8  --  1.8  --   PHOS 4.3  --  4.0  --    < > = values in this interval not displayed.    Liver Function Tests No results for input(s): AST, ALT, ALKPHOS, BILITOT, PROT, ALBUMIN in the last 72 hours.  No results for input(s): LIPASE, AMYLASE in the last 72 hours. Cardiac Enzymes No results for input(s): CKTOTAL, CKMB, CKMBINDEX, TROPONINI in the last 72 hours.  BNP: BNP (last 3 results) Recent Labs    07/19/20 0222 07/23/20 1117 08/01/2020 1707  BNP 959.9* 992.0* 1,426.8*     ProBNP (last 3 results) No results for input(s): PROBNP in the last 8760 hours.   D-Dimer No results for input(s): DDIMER in the last 72  hours. Hemoglobin A1C No results for input(s): HGBA1C in the last 72 hours.  Fasting Lipid Panel Recent Labs    08/17/20 0420  TRIG 132    Thyroid Function Tests No results for input(s): TSH, T4TOTAL, T3FREE, THYROIDAB in the last 72 hours.  Invalid input(s): FREET3  Other results:   Imaging    DG Abd Portable 1V  Result Date: 08/17/2020 CLINICAL DATA:  Feeding tube placement EXAM: PORTABLE ABDOMEN - 1 VIEW COMPARISON:  None. FINDINGS: Feeding tube tip is in the distal stomach. Nonobstructive bowel gas pattern. Mild gaseous distention of the stomach. IMPRESSION: Feeding tube tip in the distal stomach. Electronically Signed   By: Rolm Baptise M.D.   On: 08/17/2020 17:08     Medications:     Scheduled Medications:  alteplase  2 mg Intracatheter Once   chlorhexidine gluconate (MEDLINE KIT)  15 mL Mouth Rinse BID   Chlorhexidine Gluconate Cloth  6 each Topical Daily   docusate  100 mg Per Tube BID   feeding supplement (PROSource TF)  45 mL Per Tube QID   fentaNYL (SUBLIMAZE) injection  50 mcg Intravenous Once   insulin aspart  0-15 Units Subcutaneous Q4H   mouth rinse  15 mL Mouth Rinse 10 times per day   mexiletine  300 mg Per Tube Q12H   midodrine  10 mg Per Tube TID WC   mupirocin ointment  1 application Nasal BID   pantoprazole sodium  40 mg Per Tube Daily   polyethylene glycol  17 g Per Tube Daily   sodium chloride flush  10-40 mL Intracatheter Q12H   sodium chloride flush  10-40 mL Intracatheter Q12H   vancomycin variable dose per unstable renal function (pharmacist dosing)   Does not apply See admin instructions    Infusions:  sodium chloride Stopped (08/17/20 2155)   amiodarone 30 mg/hr (08/18/20 0600)   feeding supplement (VITAL 1.5 CAL) Stopped (08/17/20 1502)   fentaNYL infusion INTRAVENOUS 150 mcg/hr (08/18/20 0600)   heparin 2,450 Units/hr (08/18/20 0600)   magnesium sulfate bolus IVPB     milrinone 0.375 mcg/kg/min (08/18/20 0600)   norepinephrine  (LEVOPHED) Adult infusion 56 mcg/min (08/18/20 0646)   piperacillin-tazobactam (ZOSYN)  IV 12.5 mL/hr at 08/18/20 0600   propofol (DIPRIVAN) infusion 20 mcg/kg/min (08/18/20 5956)  vasopressin 0.03 Units/min (08/18/20 0600)    PRN Medications: sodium chloride, acetaminophen **OR** acetaminophen, fentaNYL, ondansetron (ZOFRAN) IV, sodium chloride flush, sodium chloride flush   Assessment/Plan   Shock, likely septic mixed with cardiogenic  - Intubated and sedated after acute decompensation on 8/4 - On 8/5, he became tachycardic to 126-127 with rhythm concerning for aflutter vs ST.  Amiodarone bolus given followed by IV load.  After amio, he became acutely more hypotensive with NE dosing increased to >30 at first; now at 58.  Vasopressin started on 8/5 due to low SVR of 825 - This AM, SVR 581 with CO/CI 6.6/2.6  - Lasix drip stopped on 8/4 due to hypotension  - CVP up to 15, weight back up to 325 pounds from 302 (unclear of the accuracy of the prior weight.  Before transfer to the ICU, he weighed 327 pounds) - Concern for septic shock given low SVR, high output, elevated lactate and elevated PCT.  WBC also increased - Treated for cellulitis earlier this admission, but suspect that there is another infection.  Blood cultures repeated on 8/5.  Zosyn started for aspiration pna prophylaxis and vancomycin added on 8/5  2. Acute on chronic combined CHF:  - NICM - Now intubated and sedated after developing shock.   - RHC on 8/4: RA = 9, RV = 53/11, PA = 60/29 (42), PCW = 35 (v = 39), Fick cardiac output/index = 3.8/1.5, Ao sat = 90%, PA sat = 42% - Echo on 12/21 with EF 25-30%, moderate LV dilation. Repeat echo this admission with LVEF 20%, RV mild to moderately HK. Has medtronic ICD.  Patient sent from office visit 07/26/2020 with cellulitis, as well as acute on chronic combined CHF. Lactate elevated to 3.6. Suspect cellulitis with sepsis in setting of acute/chronic systolic CHF. Now end-stage.  -  Previously on Lasix drip -- stopped on 8/5 due to hypotension. Suspect he will need diuresis to optimize respiratory status. CXR notably worse this morning as well  -Medical therapy limited by hypotension and AKI on CKD.  - Continue NE and wean to maintain MAPs >65 -Hold carvedilol and spironolactone in the setting of decompensated CHF, shock and AKI  -Off digoxin and Farxiga with rising creatinine.   -Had discussion about GOC given recurrent HF exacerbations. Poor prognosis and struggling with end-stage HF + cardiorenal syndrome.  Not a candidate for advanced therapies given size and ileostomy. He wishes to remain full code for now. He has been discussing his wishes with family members. Has met with palliative care, not interested in seeing them again at this point. -Planning to establish with [facility] HF team after discharge. Moving to [facility] to live with brother.  3. Acute hypoxemic respiratory failure - In the setting of shock - Ventilator management per CCM  - Zosyn started prophylactically d/t concern for aspiration pneumonia. Vancomycin added on 8/5 due to concern for septic shock  - CXR on 8/6 notably worse  4. RLE cellulitis:  - Severe sepsis. Patient injured his leg PTA. No evidence of osteomyelitis or abscess on CT.  - Completed antibiotics, including 2 days of linezolid (not 14 days as previously noted) - Continue management per primary team  5. History of VT:  - Reverted to rapid aflutter (EKG reads as ST) yesterday -- started on IV amiodarone with bolus. HR now improved to 100s  - Admission 05/2020 for VT with recurrence during admission, felt to be driving by electrolyte imbalance. Seen by EP and started on amiodarone and mexiletine  with no evidence of recurrence on last interrogation 07/19/20. Patient denies ICD shocks - Continue mexiletine - Continue to monitor electrolytes closely and replete as needed to maintain K >4, Mg >2   6. History of LV thrombus:  - Switched  from Eliquis to heparin drip due to AKI  - No evidence of bleeding. Hgb stable.  7. Atrial flutter s/p ablation:  - Continue Heparin drip   8. AKI on CKD stage 3b:  - Variable baseline creatinine 1.8-2.5.  - 2.7 on admit, down to 1.95. Now 2.0 > 2.27 > 2.6>>2.9>>3>>3.06>>3.34>>3.55>>3.76>>3.6>>3.9 - Continue daily BMP - Likely cardiorenal - Suspect component of ATN from low BP.  9. Diverticulitis s/p ileostomy  10. Hyponatremia, improving  -Hypervolemic hyponatremia -Improving -123>124>125>126>128>129>131>129>129 - 135 this AM   11. Hypokalemia - Secondary to aggressive diuresis - Replete    Length of Stay: [address], [place]  08/18/2020, 8:20 AM  Advanced Heart Failure Team Pager [phone] (M-F; 7a - 5p)  Please contact [facility] for night-coverage after hours (5p -7a ) and weekends on [email or web address]  Agree with above.   Remains intubated/sedated. On high dose NE (58), VP 0.03, and milrinone 0.375. Now on broad spectrum abx.   Swan numbers reviewed. CO ok SVR 581. PCWP 31. Off lasix overnight. CXR with diffuse edema  General:  intubated/sedated HEENT: normal + ETT Neck: supple. RIJ swan. Carotids 2+ bilat; no bruits. No lymphadenopathy or thryomegaly appreciated. Cor: PMI nondisplaced. Regular tachy No rubs, gallops or murmurs. Lungs: coarse Abdomen: obese soft, nontender, nondistended. No hepatosplenomegaly. No bruits or masses. Good bowel sounds. Extremities: no cyanosis, clubbing, rash, 3+ edema + UNNA Neuro: sedated on vent  Deteriorated overnight. Suspect likely developed septic physiology on top of cardiogenic shock. Now on high-dose NE. SCr worse. Urine output down.   Will cut back milrinone. Increase VP. Give one dose of lasix 120 IV.   Prognosis poor. Headed toward CVVHD. Not candidate for advanced HF therapies.   D/w CCM.   CRITICAL CARE Performed by: Glori Bickers  Total critical care time: 45 minutes  Critical care time was exclusive of  separately billable procedures and treating other patients.  Critical care was necessary to treat or prevent imminent or life-threatening deterioration.  Critical care was time spent personally by me (independent of midlevel providers or residents) on the following activities: development of treatment plan with patient and/or surrogate as well as nursing, discussions with consultants, evaluation of patient's response to treatment, examination of patient, obtaining history from patient or surrogate, ordering and performing treatments and interventions, ordering and review of laboratory studies, ordering and review of radiographic studies, pulse oximetry and re-evaluation of patient's condition.   Glori Bickers, MD  9:23 AM

## 2020-08-18 NOTE — Progress Notes (Signed)
Patient with large amount of emesis; Dr. Tonia Brooms notified order to place OGT to [facility] and hold all POs for now.

## 2020-08-18 NOTE — Procedures (Signed)
Central Venous Catheter Insertion Procedure Note  Roberto [name]  [id number]  28-Feb-1965  Date:08/18/20  Time:3:23 PM   Provider Performing:Lynn Sissel D. Harris   Procedure: Insertion of Non-tunneled Central Venous Catheter(36556)with US guidance (78295)  Ultrasound used for placement but unable to capture image of guidewire in vessel as no assistant available to capture while provider was sterile   Indication(s) Hemodialysis  Consent Risks of the procedure as well as the alternatives and risks of each were explained to the patient and/or caregiver.  Consent for the procedure was obtained and is signed in the bedside chart  Anesthesia Topical only with 1% lidocaine   Timeout Verified patient identification, verified procedure, site/side was marked, verified correct patient position, special equipment/implants available, medications/allergies/relevant history reviewed, required imaging and test results available.  Sterile Technique Maximal sterile technique including full sterile barrier drape, hand hygiene, sterile gown, sterile gloves, mask, hair covering, sterile ultrasound probe cover (if used).  Procedure Description Area of catheter insertion was cleaned with chlorhexidine and draped in sterile fashion.   With real-time ultrasound guidance a HD catheter was placed into the left internal jugular vein.  Nonpulsatile blood flow and easy flushing noted in all ports.  The catheter was sutured in place and sterile dressing applied.  Complications/Tolerance None; patient tolerated the procedure well. Chest X-ray is ordered to verify placement for internal jugular or subclavian cannulation.  Chest x-ray is not ordered for femoral cannulation.  EBL Minimal  Specimen(s) None   Roberto [name] D. Tiburcio Pea, NP-C [facility] Personal contact information can be found on Amion  08/18/2020, 3:23 PM

## 2020-08-19 ENCOUNTER — Inpatient Hospital Stay (HOSPITAL_COMMUNITY): Payer: BC Managed Care – PPO

## 2020-08-19 DIAGNOSIS — I5043 Acute on chronic combined systolic (congestive) and diastolic (congestive) heart failure: Secondary | ICD-10-CM | POA: Diagnosis not present

## 2020-08-19 LAB — POCT I-STAT 7, (LYTES, BLD GAS, ICA,H+H)
Acid-base deficit: 3 mmol/L — ABNORMAL HIGH (ref 0.0–2.0)
Acid-base deficit: 4 mmol/L — ABNORMAL HIGH (ref 0.0–2.0)
Bicarbonate: 22.8 mmol/L (ref 20.0–28.0)
Bicarbonate: 23.6 mmol/L (ref 20.0–28.0)
Calcium, Ion: 1.06 mmol/L — ABNORMAL LOW (ref 1.15–1.40)
Calcium, Ion: 1.06 mmol/L — ABNORMAL LOW (ref 1.15–1.40)
HCT: 42 % (ref 39.0–52.0)
HCT: 42 % (ref 39.0–52.0)
Hemoglobin: 14.3 g/dL (ref 13.0–17.0)
Hemoglobin: 14.3 g/dL (ref 13.0–17.0)
O2 Saturation: 97 %
O2 Saturation: 99 %
Patient temperature: 36.8
Potassium: 4.1 mmol/L (ref 3.5–5.1)
Potassium: 4.4 mmol/L (ref 3.5–5.1)
Sodium: 137 mmol/L (ref 135–145)
Sodium: 137 mmol/L (ref 135–145)
TCO2: 24 mmol/L (ref 22–32)
TCO2: 25 mmol/L (ref 22–32)
pCO2 arterial: 44.6 mmHg (ref 32.0–48.0)
pCO2 arterial: 46.8 mmHg (ref 32.0–48.0)
pH, Arterial: 7.296 — ABNORMAL LOW (ref 7.350–7.450)
pH, Arterial: 7.331 — ABNORMAL LOW (ref 7.350–7.450)
pO2, Arterial: 100 mmHg (ref 83.0–108.0)
pO2, Arterial: 159 mmHg — ABNORMAL HIGH (ref 83.0–108.0)

## 2020-08-19 LAB — RENAL FUNCTION PANEL
Albumin: 1.7 g/dL — ABNORMAL LOW (ref 3.5–5.0)
Albumin: 1.7 g/dL — ABNORMAL LOW (ref 3.5–5.0)
Anion gap: 12 (ref 5–15)
Anion gap: 10 (ref 5–15)
BUN: 35 mg/dL — ABNORMAL HIGH (ref 6–20)
BUN: 22 mg/dL — ABNORMAL HIGH (ref 6–20)
CO2: 17 mmol/L — ABNORMAL LOW (ref 22–32)
CO2: 21 mmol/L — ABNORMAL LOW (ref 22–32)
Calcium: 7.9 mg/dL — ABNORMAL LOW (ref 8.9–10.3)
Calcium: 7.9 mg/dL — ABNORMAL LOW (ref 8.9–10.3)
Chloride: 103 mmol/L (ref 98–111)
Chloride: 102 mmol/L (ref 98–111)
Creatinine, Ser: 3.03 mg/dL — ABNORMAL HIGH (ref 0.61–1.24)
Creatinine, Ser: 2.46 mg/dL — ABNORMAL HIGH (ref 0.61–1.24)
GFR, Estimated: 24 mL/min — ABNORMAL LOW (ref >60)
GFR, Estimated: 30 mL/min — ABNORMAL LOW (ref >60)
Glucose, Bld: 160 mg/dL — ABNORMAL HIGH (ref 70–99)
Glucose, Bld: 154 mg/dL — ABNORMAL HIGH (ref 70–99)
Phosphorus: 3.5 mg/dL (ref 2.5–4.6)
Phosphorus: 3.3 mg/dL (ref 2.5–4.6)
Potassium: 4.3 mmol/L (ref 3.5–5.1)
Potassium: 4.1 mmol/L (ref 3.5–5.1)
Sodium: 132 mmol/L — ABNORMAL LOW (ref 135–145)
Sodium: 133 mmol/L — ABNORMAL LOW (ref 135–145)

## 2020-08-19 LAB — BLOOD GAS, ARTERIAL
Acid-base deficit: 7 mmol/L — ABNORMAL HIGH (ref 0.0–2.0)
Bicarbonate: 18.2 mmol/L — ABNORMAL LOW (ref 20.0–28.0)
FIO2: 50
O2 Saturation: 98.5 %
Patient temperature: 36.9
pCO2 arterial: 37.2 mmHg (ref 32.0–48.0)
pH, Arterial: 7.308 — ABNORMAL LOW (ref 7.350–7.450)
pO2, Arterial: 122 mmHg — ABNORMAL HIGH (ref 83.0–108.0)

## 2020-08-19 LAB — GLUCOSE, CAPILLARY
Glucose-Capillary: 156 mg/dL — ABNORMAL HIGH (ref 70–99)
Glucose-Capillary: 166 mg/dL — ABNORMAL HIGH (ref 70–99)
Glucose-Capillary: 172 mg/dL — ABNORMAL HIGH (ref 70–99)
Glucose-Capillary: 149 mg/dL — ABNORMAL HIGH (ref 70–99)
Glucose-Capillary: 162 mg/dL — ABNORMAL HIGH (ref 70–99)

## 2020-08-19 LAB — CBC
HCT: 39.9 % (ref 39.0–52.0)
Hemoglobin: 12.6 g/dL — ABNORMAL LOW (ref 13.0–17.0)
MCH: 29.6 pg (ref 26.0–34.0)
MCHC: 31.6 g/dL (ref 30.0–36.0)
MCV: 93.9 fL (ref 80.0–100.0)
Platelets: 301 10*3/uL (ref 150–400)
RBC: 4.25 MIL/uL (ref 4.22–5.81)
RDW: 16.1 % — ABNORMAL HIGH (ref 11.5–15.5)
WBC: 18.6 10*3/uL — ABNORMAL HIGH (ref 4.0–10.5)
nRBC: 1.1 % — ABNORMAL HIGH (ref 0.0–0.2)

## 2020-08-19 LAB — COOXEMETRY PANEL
Carboxyhemoglobin: 0.9 % (ref 0.5–1.5)
Carboxyhemoglobin: 1.1 % (ref 0.5–1.5)
Methemoglobin: 1 % (ref 0.0–1.5)
Methemoglobin: 0.8 % (ref 0.0–1.5)
O2 Saturation: 66.4 %
O2 Saturation: 77.8 %
Total hemoglobin: 12.3 g/dL (ref 12.0–16.0)
Total hemoglobin: 13.2 g/dL (ref 12.0–16.0)

## 2020-08-19 LAB — CULTURE, RESPIRATORY W GRAM STAIN: Gram Stain: NONE SEEN

## 2020-08-19 LAB — HEPARIN LEVEL (UNFRACTIONATED): Heparin Unfractionated: 0.63 IU/mL (ref 0.30–0.70)

## 2020-08-19 LAB — APTT: aPTT: 66 seconds — ABNORMAL HIGH (ref 24–36)

## 2020-08-19 LAB — PROCALCITONIN: Procalcitonin: 1.59 ng/mL

## 2020-08-19 LAB — MAGNESIUM: Magnesium: 2.2 mg/dL (ref 1.7–2.4)

## 2020-08-19 MED ORDER — PANTOPRAZOLE SODIUM 40 MG IV SOLR
40.0000 mg | Freq: Every day | INTRAVENOUS | Status: DC
  Administered 2020-08-19 – 2020-08-20 (×2): 40 mg via INTRAVENOUS
  Filled 2020-08-19 (×2): qty 40

## 2020-08-19 NOTE — Progress Notes (Signed)
Patient ID: Roberto [name], male   DOB: [birth year], 55 y.o.   MRN: [id number]     Advanced Heart Failure Rounding Note  PCP-Cardiologist: Glori Bickers, MD   Subjective:    Events - 8/5 Swan placed. Moved to ICU. Intubated.  - 8/6 septic physiology. - 8/6 CVVHD started.   Remains sedated on vent. Started on CVVHD yesterday. Weight down 3 pounds. On NE 80, milrinone 0.25 (turned down due to sepsis), VP 0.04. Co-ox 66%  Unable to keep anything down.   Swan #s RA 13 PA 55/29 PCWP 30 Thermo 5.7/2.3 SVR 791   Objective:   Weight Range: (!) 146.2 kg Body mass index is 49.01 kg/m.   Vital Signs:   Temp:  [96.8 F (36 C)-100.04 F (37.8 C)] 98.78 F (37.1 C) (08/07 0930) Pulse Rate:  [93-109] 104 (08/07 0930) Resp:  [16-43] 24 (08/07 0930) BP: (76-107)/(46-73) 103/73 (08/07 0930) SpO2:  [90 %-97 %] 96 % (08/07 0930) Arterial Line BP: (69-117)/(41-66) 98/53 (08/07 0930) FiO2 (%):  [50 %] 50 % (08/07 0805) Weight:  [146.2 kg] 146.2 kg (08/07 0445) Last BM Date: 08/18/20  Weight change: Filed Weights   08/17/20 0457 08/18/20 0500 08/19/20 0445  Weight: (!) 137.2 kg (!) 147.5 kg (!) 146.2 kg    Intake/Output:   Intake/Output Summary (Last 24 hours) at 08/19/2020 1045 Last data filed at 08/19/2020 1000 Gross per 24 hour  Intake 4230.43 ml  Output 4848 ml  Net -617.57 ml       Physical Exam   General:  Intubated/sedated. Ill-appearing HEENT: normal + ETT Neck: supple. RIJ swan LIJ HD cath Cor: PMI nondisplaced. Regular rate & rhythm. No rubs, gallops or murmurs. Lungs: clear Abdomen: obese soft, nontender, nondistended. + ileostomy Good bowel sounds. Extremities: no cyanosis, clubbing, rash, 3+ edema +UNNA Neuro: sedated on vent   Telemetry   ST 100-110 Personally reviewed  Labs    CBC Recent Labs    08/17/20 0420 08/17/20 0446 08/17/20 1650 08/17/20 2313 08/18/20 0423 08/18/20 0429 08/18/20 1535 08/19/20 0325  WBC 15.8*  --  18.4*  --   18.7*  --   --  18.6*  NEUTROABS 11.0*  --  12.4*  --   --   --   --   --   HGB 12.7*   < > 13.3   < > 12.7*   < > 13.9 12.6*  HCT 37.8*   < > 40.5   < > 39.6   < > 41.0 39.9  MCV 89.6  --  91.4  --  93.2  --   --  93.9  PLT 325  --  350  --  325  --   --  301   < > = values in this interval not displayed.    Basic Metabolic Panel Recent Labs    08/18/20 0423 08/18/20 0429 08/18/20 1309 08/18/20 1535 08/19/20 0325  NA 129*   < > 130* 134* 132*  K 3.9   < > 4.3 4.3 4.3  CL 101  --  103  --  103  CO2 15*  --  15*  --  17*  GLUCOSE 167*  --  178*  --  160*  BUN 56*  --  57*  --  35*  CREATININE 3.93*  --  4.22*  --  3.03*  CALCIUM 8.2*  --  8.2*  --  7.9*  MG 1.8  --   --   --  2.2  PHOS 4.0  --   --   --  3.5   < > = values in this interval not displayed.    Liver Function Tests Recent Labs    08/19/20 0325  ALBUMIN 1.7*    No results for input(s): LIPASE, AMYLASE in the last 72 hours. Cardiac Enzymes No results for input(s): CKTOTAL, CKMB, CKMBINDEX, TROPONINI in the last 72 hours.  BNP: BNP (last 3 results) Recent Labs    07/19/20 0222 07/23/20 1117 08/11/2020 1707  BNP 959.9* 992.0* 1,426.8*     ProBNP (last 3 results) No results for input(s): PROBNP in the last 8760 hours.   D-Dimer No results for input(s): DDIMER in the last 72 hours. Hemoglobin A1C No results for input(s): HGBA1C in the last 72 hours.  Fasting Lipid Panel Recent Labs    08/17/20 0420  TRIG 132    Thyroid Function Tests No results for input(s): TSH, T4TOTAL, T3FREE, THYROIDAB in the last 72 hours.  Invalid input(s): FREET3  Other results:   Imaging    DG CHEST PORT 1 VIEW  Result Date: 08/19/2020 CLINICAL DATA:  Respiratory dependent. Evaluate bilateral airspace disease, edema. Pleural effusion. Right heart catheterization. EXAM: PORTABLE CHEST 1 VIEW COMPARISON:  August 18, 2020. FINDINGS: Similar position of the endotracheal tube tip at the clavicular heads.  Endotracheal tube courses below the diaphragm with the tip outside the field of view. Right IJ Swan-Ganz catheter with the tip projecting in the expected region of the descending right pulmonary artery. Left IJ central venous catheter with the tip projecting expected region of the confluence of the left brachiocephalic vein and SVC. Mildly improved aeration lungs with right greater than left airspace opacities. No visible pneumothorax or large pleural effusion with evaluation limited on the single AP semi erect radiograph. Enlarged cardiac silhouette, similar to prior. Left subclavian approach single lead cardiac rhythm maintenance device. IMPRESSION: 1. Similar position of support devices, as detailed above. 2. Mildly improved aeration of the lungs with right greater than left airspace opacities, which could represent asymmetric edema and/or pneumonia. 3. Similar cardiomegaly. Electronically Signed   By: Margaretha Sheffield MD   On: 08/19/2020 08:15   DG CHEST PORT 1 VIEW  Result Date: 08/18/2020 CLINICAL DATA:  Central line placement EXAM: PORTABLE CHEST 1 VIEW COMPARISON:  08/18/2020 FINDINGS: Left AICD, endotracheal tube and NG tube remain in place, unchanged. Right Swan-Ganz catheter is in the descending right pulmonary artery. Left central line placement with the tip at the confluence of the innominate veins in the upper SVC. Diffuse bilateral airspace disease, worsening since prior study. Cardiomegaly. IMPRESSION: Left central line placement with the tip in the upper SVC. No pneumothorax. Cardiomegaly. Worsening bilateral airspace disease, right greater than left, could reflect edema or infection Electronically Signed   By: Rolm Baptise M.D.   On: 08/18/2020 17:24     Medications:     Scheduled Medications:  alteplase  2 mg Intracatheter Once   chlorhexidine gluconate (MEDLINE KIT)  15 mL Mouth Rinse BID   Chlorhexidine Gluconate Cloth  6 each Topical Daily   fentaNYL (SUBLIMAZE) injection  50 mcg  Intravenous Once   insulin aspart  0-15 Units Subcutaneous Q4H   mouth rinse  15 mL Mouth Rinse 10 times per day   mupirocin ointment  1 application Nasal BID   pantoprazole (PROTONIX) IV  40 mg Intravenous Daily   sodium chloride flush  10-40 mL Intracatheter Q12H   sodium chloride flush  10-40 mL Intracatheter Q12H    Infusions:   prismasol BGK 4/2.5 400 mL/hr at 08/19/20  0610    prismasol BGK 4/2.5 400 mL/hr at 08/19/20 0557   sodium chloride Stopped (08/17/20 2155)   amiodarone 30 mg/hr (08/19/20 1007)   fentaNYL infusion INTRAVENOUS 200 mcg/hr (08/19/20 1000)   heparin 2,450 Units/hr (08/19/20 1000)   milrinone 0.25 mcg/kg/min (08/19/20 1000)   norepinephrine (LEVOPHED) Adult infusion 80 mcg/min (08/19/20 1000)   piperacillin-tazobactam (ZOSYN)  IV 12.5 mL/hr at 08/19/20 1000   prismasol BGK 4/2.5 2,000 mL/hr at 08/19/20 1761   vancomycin Stopped (08/18/20 2132)   vasopressin 0.04 Units/min (08/19/20 1000)    PRN Medications: sodium chloride, acetaminophen **OR** acetaminophen, fentaNYL, heparin, heparin, heparin, midazolam, ondansetron (ZOFRAN) IV, sodium chloride flush, sodium chloride flush   Assessment/Plan   Shock,combined cardiogenic/septic - Intubated and sedated after acute decompensation on 8/4 - On vanc/zosyn for sepsis - Now on NE 80, milrinone 0.25, VP 0.04. Swan numbers improved. SVR 791. Will drop milrinone to 0.125 for now, - Continue CVVHD for volume control - Prognosis guarded  2. 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  - Echo 8/22 EF 20%, RV mild to moderately HK. Has medtronic ICD.  - Off GDMT due to shock. CVVHD started 8/6 - Remains markedly volume overloaded. Continue to pull with CVVHD - Prognosis extremely tenuous. Not a candidate for advanced therapies given size and ileostomy.Has met with  palliative care, and remains full code for now,   3. Acute hypoxemic respiratory failure - Intubated 8/5 in the setting of shock - Ventilator management per CCM  - Zosyn started prophylactically d/t concern for aspiration pneumonia. Vancomycin added on 8/5 due to concern for septic shock   4. AKI on CKD stage 3b:  - Variable baseline creatinine 1.8-2.5.  - AKI due to ATN/shock - CVVHD started 08/18/20 - likely poor candidate for iHD  5. RLE cellulitis:  - Resolved  6. History of VT:  - ICD in place - Keep  K >4, Mg >2   7. History of LV thrombus:  - Switched from Eliquis to heparin drip due to AKI  - No evidence of bleeding. Hgb stable.  8. Atrial flutter s/p ablation:  - Continue Heparin drip  - on amio  9. Diverticulitis s/p ileostomy  10. Hyponatremia -Hypervolemic hyponatremia - NA 132  11. Ileus - due to shock. Hold TFs  CRITICAL CARE Performed by: Glori Bickers  Total critical care time: 55 minutes  Critical care time was exclusive of separately billable procedures and treating other patients.  Critical care was necessary to treat or prevent imminent or life-threatening deterioration.  Critical care was time spent personally by me (independent of midlevel providers or residents) on the following activities: development of treatment plan with patient and/or surrogate as well as nursing, discussions with consultants, evaluation of patient's response to treatment, examination of patient, obtaining history from patient or surrogate, ordering and performing treatments and interventions, ordering and review of laboratory studies, ordering and review of radiographic studies, pulse oximetry and re-evaluation of patient's condition.   Length of Stay: [place], MD  08/19/2020, 10:45 AM  Advanced Heart Failure Team Pager [phone] (M-F; 7a - 5p)  Please contact [facility] for night-coverage after hours (5p -7a ) and weekends on [email or web address]

## 2020-08-19 NOTE — Progress Notes (Signed)
Patient ID: Roberto [name], male   DOB: November 09, 1965, 55 y.o.   MRN: [id number] [facility] Progress Note   Assessment/ Plan:   1.  Acute kidney injury on chronic kidney disease stage IIIb: Likely ATN associated with sepsis/cardiogenic shock with underlying cardiorenal syndrome.  Started on CRRT for volume unloading after refractory to diuretics while on pressors/inotropic support.  We will continue current prescription of CRRT with efforts at ultrafiltration.  Overall prognosis poor with challenges in controlling his underlying congestive heart failure. 2.  Acute on chronic combined congestive heart failure: With cardiogenic shock and currently on pressor/inotropic support.  Started CRRT yesterday for extracorporeal volume unloading after poor response to diuretics.  Continue family discussions regarding goals of care based on poor prognosis. 3.  Acute hypoxic respiratory failure: Secondary to decompensated congestive heart failure/pulmonary edema.  On broad-spectrum antibiotic therapy and volume unloading with CRRT. 4.  Atrial flutter: On heparin drip and amiodarone, intermittent tachycardia noted. 5.  Hyponatremia: Secondary to CHF exacerbation and acute kidney injury with impaired free water handling-monitor with volume unloading with CRRT.  Subjective:   Problems with ventilation/oxygenation noted overnight and telemetry changes noted this morning-awaiting stat EKG.  Able to tolerate CRRT with UF of around 150 cc/h.   Objective:   BP 101/62   Pulse (!) 104   Temp 98.6 F (37 C)   Resp (!) 24   Ht '5\' 8"'  (1.727 m)   Wt (!) 146.2 kg   SpO2 97%   BMI 49.01 kg/m   Intake/Output Summary (Last 24 hours) at 08/19/2020 0751 Last data filed at 08/19/2020 0700 Gross per 24 hour  Intake 4342.84 ml  Output 3846 ml  Net 496.84 ml   Weight change: -1.3 kg  Physical Exam: Gen: Appears comfortable, on ventilator, unresponsive CVS: Pulse regular tachycardia, S1 and S2 normal Resp:  Distant breath sounds bilaterally, no distinct rales or rhonchi Abd: Soft, obese, nontender, bowel sounds normal Ext: 2+ anasarca  Imaging: DG CHEST PORT 1 VIEW  Result Date: 08/18/2020 CLINICAL DATA:  Central line placement EXAM: PORTABLE CHEST 1 VIEW COMPARISON:  08/18/2020 FINDINGS: Left AICD, endotracheal tube and NG tube remain in place, unchanged. Right Swan-Ganz catheter is in the descending right pulmonary artery. Left central line placement with the tip at the confluence of the innominate veins in the upper SVC. Diffuse bilateral airspace disease, worsening since prior study. Cardiomegaly. IMPRESSION: Left central line placement with the tip in the upper SVC. No pneumothorax. Cardiomegaly. Worsening bilateral airspace disease, right greater than left, could reflect edema or infection Electronically Signed   By: Rolm Baptise M.D.   On: 08/18/2020 17:24   DG Chest Port 1 View  Result Date: 08/18/2020 CLINICAL DATA:  55 year old male with history of respiratory failure. EXAM: PORTABLE CHEST 1 VIEW COMPARISON:  Chest x-ray 08/17/2020. FINDINGS: An endotracheal tube is in place with tip 3.5 cm above the carina. Right IJ Cordis through which a Swan-Ganz catheter has been passed into a descending right pulmonary artery branch. Left-sided pacemaker/AICD with lead tip projecting over the expected location of the right ventricular apex. A feeding tube is seen extending into the abdomen, however, the tip of the feeding tube extends below the lower margin of the image. There is cephalization of the pulmonary vasculature, indistinctness of the interstitial markings, and patchy airspace disease throughout the lungs bilaterally suggestive of moderate pulmonary edema. Small right pleural effusion. Heart size is moderately enlarged. Upper mediastinal contours are within normal limits allowing for patient positioning. IMPRESSION:  1. Support apparatus, as above. 2. The appearance the chest is favored to reflect  congestive heart failure, as above. 3. Small right pleural effusion. Electronically Signed   By: Vinnie Langton M.D.   On: 08/18/2020 08:51   DG Abd Portable 1V  Result Date: 08/17/2020 CLINICAL DATA:  Feeding tube placement EXAM: PORTABLE ABDOMEN - 1 VIEW COMPARISON:  None. FINDINGS: Feeding tube tip is in the distal stomach. Nonobstructive bowel gas pattern. Mild gaseous distention of the stomach. IMPRESSION: Feeding tube tip in the distal stomach. Electronically Signed   By: Rolm Baptise M.D.   On: 08/17/2020 17:08    Labs: BMET Recent Labs  Lab 09/07/2020 9528 08/29/2020 1728 08/15/2020 2050 08/17/20 0420 08/17/20 0446 08/17/20 1532 08/17/20 2313 08/18/20 0423 08/18/20 0429 08/18/20 1309 08/18/20 1535 08/19/20 0325  NA 129*   < > 129* 130*   < > 132* 133* 129* 135 130* 134* 132*  K 4.2   < > 4.2 3.2*   < > 4.2 4.5 3.9 3.8 4.3 4.3 4.3  CL 101  --  102 101  --  102  --  101  --  103  --  103  CO2 15*  --  15* 15*  --  17*  --  15*  --  15*  --  17*  GLUCOSE 152*  --  165* 163*  --  182*  --  167*  --  178*  --  160*  BUN 52*  --  55* 54*  --  53*  --  56*  --  57*  --  35*  CREATININE 3.76*  --  3.74* 3.60*  --  3.61*  --  3.93*  --  4.22*  --  3.03*  CALCIUM 9.0  --  8.9 8.5*  --  8.5*  --  8.2*  --  8.2*  --  7.9*  PHOS  --   --   --  3.8  --  4.3  --  4.0  --   --   --  3.5   < > = values in this interval not displayed.   CBC Recent Labs  Lab 08/15/20 0550 08/15/20 1051 08/18/2020 0646 08/25/2020 1728 08/17/20 0420 08/17/20 0446 08/17/20 1650 08/17/20 2313 08/18/20 0423 08/18/20 0429 08/18/20 1535 08/19/20 0325  WBC 13.6*   < > 15.1*   < > 15.8*  --  18.4*  --  18.7*  --   --  18.6*  NEUTROABS 10.5*  --  11.4*  --  11.0*  --  12.4*  --   --   --   --   --   HGB 13.3   < > 13.5   < > 12.7*   < > 13.3   < > 12.7* 13.6 13.9 12.6*  HCT 40.4   < > 42.6   < > 37.8*   < > 40.5   < > 39.6 40.0 41.0 39.9  MCV 91.6   < > 92.6   < > 89.6  --  91.4  --  93.2  --   --  93.9  PLT  292   < > 351   < > 325  --  350  --  325  --   --  301   < > = values in this interval not displayed.    Medications:     alteplase  2 mg Intracatheter Once   chlorhexidine gluconate (MEDLINE KIT)  15 mL Mouth Rinse BID  Chlorhexidine Gluconate Cloth  6 each Topical Daily   docusate  100 mg Per Tube BID   feeding supplement (PROSource TF)  45 mL Per Tube QID   fentaNYL (SUBLIMAZE) injection  50 mcg Intravenous Once   insulin aspart  0-15 Units Subcutaneous Q4H   mouth rinse  15 mL Mouth Rinse 10 times per day   mexiletine  300 mg Per Tube Q12H   midodrine  10 mg Per Tube TID WC   mupirocin ointment  1 application Nasal BID   pantoprazole sodium  40 mg Per Tube Daily   polyethylene glycol  17 g Per Tube Daily   sodium chloride flush  10-40 mL Intracatheter Q12H   sodium chloride flush  10-40 mL Intracatheter Q12H   Elmarie Shiley, MD 08/19/2020, 7:51 AM

## 2020-08-19 NOTE — Progress Notes (Signed)
ANTICOAGULATION CONSULT NOTE  Pharmacy Consult for Heparin Indication: atrial fibrillation and LV thrombus  Allergies  Allergen Reactions   Shrimp [Shellfish Allergy] Hives and Itching    Patient Measurements: Height: 5\' 8"  (172.7 cm) Weight: (!) 146.2 kg (322 lb 5 oz) IBW/kg (Calculated) : 68.4 Heparin Dosing Weight: 104 kg  Vital Signs: Temp: 98.6 F (37 C) (08/07 0715) Temp Source: Core (08/06 2000) BP: 101/62 (08/07 0700) Pulse Rate: 104 (08/07 0715)  Labs: Recent Labs    08/17/20 0420 08/17/20 0446 08/17/20 1250 08/17/20 1532 08/17/20 1650 08/17/20 2313 08/18/20 0423 08/18/20 0429 08/18/20 1309 08/18/20 1535 08/19/20 0325  HGB 12.7*   < >  --   --  13.3   < > 12.7* 13.6  --  13.9 12.6*  HCT 37.8*   < >  --   --  40.5   < > 39.6 40.0  --  41.0 39.9  PLT 325  --   --   --  350  --  325  --   --   --  301  APTT 49*  --  63*  --   --   --  73*  --   --   --  66*  HEPARINUNFRC >1.10*  --   --   --   --   --  1.07*  --   --   --  0.63  CREATININE 3.60*  --   --    < >  --   --  3.93*  --  4.22*  --  3.03*   < > = values in this interval not displayed.     Estimated Creatinine Clearance: 39.2 mL/min (A) (by C-G formula based on SCr of 3.03 mg/dL (H)).   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   Medtronic ICD   Arthritis    knee   Chronic combined systolic and diastolic CHF (congestive heart failure) (HCC)    Chronic venous insufficiency    Coronary artery disease    Deep vein thrombosis (HCC) 2007   left leg   Diverticulitis    c/b perforated colon and nonhealing abdominal wound s/p skin grafting, ileostomy   Hepatitis    patient denies this dx   Lupus anticoagulant positive    Morbid obesity (HCC)    Morbid obesity with BMI of 50.0-59.9, adult (HCC)    Mural thrombus of heart    coumadin   Nonischemic cardiomyopathy (HCC)    a) 12/17/11 echo: LVEF 25-30%, grade 3  diastolic dysfunction (c/w restriction), mod MR, mod LA/LV and mild RA dilatation; b) 12/18/11 cMRI: LVEF 38%, mod LV/mild RV dilatation, global HK, mild-mod RV sys dysfxn, no LV thrombus & patchy non-subendocardial delayed enhancement c/w infil dz or prior myocarditis; c. 07/2018 EF 25-30%.   Paroxysmal SVT (supraventricular tachycardia) (HCC)    Pneumonia    x 2   Pulmonary embolism (HCC)    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   Ventricular tachyarrhythmia [facility])    Prior VT/VF   Wears glasses    Wound of abdomen     Medications:  Infusions:    prismasol BGK 4/2.5 400 mL/hr at 08/19/20 0610    prismasol BGK 4/2.5 400 mL/hr at 08/19/20 0557   sodium chloride Stopped (08/17/20 2155)   amiodarone 30 mg/hr (08/19/20 0700)   feeding supplement (VITAL 1.5 CAL) Stopped (08/17/20 1502)   fentaNYL infusion INTRAVENOUS 175 mcg/hr (08/19/20 0700)  heparin 2,450 Units/hr (08/19/20 0700)   milrinone 0.25 mcg/kg/min (08/19/20 0700)   norepinephrine (LEVOPHED) Adult infusion 80 mcg/min (08/19/20 0700)   piperacillin-tazobactam (ZOSYN)  IV 12.5 mL/hr at 08/19/20 0700   prismasol BGK 4/2.5 2,000 mL/hr at 08/19/20 0601   vancomycin Stopped (08/18/20 2132)   vasopressin 0.04 Units/min (08/19/20 0700)    Assessment: Roberto [name] on Apixaban PTA for hx LV thrombus/Afib admitted since 7/22 and with worsening AoCHF exacerbation and renal function - s/p RHC 8/4 with resp distress requiring intubation. Pharmacy consulted to hold Apixaban and start Heparin for anticoagulation.  Heparin drip 2450 uts/hr aptt 66 sec at goal CBC ok no change Using aptt to dose heparin for now as apixaban falsely elevates heparin level  CRRT started last pm   Goal of Therapy:  Heparin level 0.3-0.7 units/ml aPTT 66-102 seconds Monitor platelets by anticoagulation protocol: Yes   Plan:  -Continue heparin  2450 units/h -Recheck heparin level and aptt with am labs  [employer] Pharm.D. CPP, BCPS Clinical  Pharmacist [phone] 08/19/2020 7:31 AM   Please check AMION for all [facility] numbers 08/19/2020

## 2020-08-19 NOTE — Progress Notes (Signed)
RN paging regarding wide complex rhythm during which the patient's BP dropped.  Reviewed Tele, reviewed EKG and strips. He has baseline afib/flutter and in btw has NSVT runs, PVCs and poor R wave progression with IVCD. Most of the rhythm strips are irregular indicating afib/flutter with abberancy. Difficult to accurately interpret the qtc given irregular rhythm.  Recommended to check ScVO2 given soft BP and recently dropped Milrinone dose in am but now his pressor requirement is going up.  - Check scvo2 - restart amiodarone IV - continue current pressors. If Scvo2 is low then will go up on Milrionone - cvp 10, slow down on pulling fluids from CRRT.

## 2020-08-19 NOTE — Progress Notes (Signed)
NAME:  Roberto [name], MRN:  [id number], DOB:  08-13-1965, LOS: 15 ADMISSION DATE:  08/08/2020 CONSULTATION DATE:  Aug 25, 2020 REFERRING MD:  Bensimhon CHIEF COMPLAINT:  Acute respiratory failure   History of Present Illness:  55 year old man with PMHx significant for acute-on-chronic combined CHF, NICM, CAD, history of VT (s/p AICD placement 08/2018), Aflutter (on Eliquis), DVT/PE (LLE, provoked 2007), CKD stage IIIb (baseline ~2.0), diverticulitis (c/b perforated colon/nonhealing abdominal wound requiring skin grafting and ileostomy) who presented to [facility] 7/22 for RLE cellulitis.   Found to be in cardiogenic versus septic shock in the setting of decompensated HF and cellulitis. Advanced HF team was consulted and patient was started on milrinone and IV diuresis without significant improvement. Patient continued to have worsening cardiorenal syndrome with worsening renal failure and underwent RHC and Swan placement 8/4. During procedure, patient's respiratory status decompensated and patient was intubated. Transferred [facility].  PCCM consulted for management of respiratory failure and ICU admission in the setting of decompensated HF and cardiorenal syndrome.  Pertinent Medical History:   Past Medical History:  Diagnosis Date   AICD (automatic cardioverter/defibrillator) present 08/2018   Medtronic ICD   Arthritis    knee   Chronic combined systolic and diastolic CHF (congestive heart failure) (HCC)    Chronic venous insufficiency    Coronary artery disease    Deep vein thrombosis (HCC) 2007   left leg   Diverticulitis    c/b perforated colon and nonhealing abdominal wound s/p skin grafting, ileostomy   Hepatitis    patient denies this dx   Lupus anticoagulant positive    Morbid obesity (HCC)    Morbid obesity with BMI of 50.0-59.9, adult (HCC)    Mural thrombus of heart    coumadin   Nonischemic cardiomyopathy (HCC)    a) 12/17/11 echo: LVEF 25-30%, grade 3 diastolic dysfunction (c/w  restriction), mod MR, mod LA/LV and mild RA dilatation; b) 12/18/11 cMRI: LVEF 38%, mod LV/mild RV dilatation, global HK, mild-mod RV sys dysfxn, no LV thrombus & patchy non-subendocardial delayed enhancement c/w infil dz or prior myocarditis; c. 07/2018 EF 25-30%.   Paroxysmal SVT (supraventricular tachycardia) (HCC)    Pneumonia    x 2   Pulmonary embolism (HCC)    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   Ventricular tachyarrhythmia [facility])    Prior VT/VF   Wears glasses    Wound of abdomen    Significant Hospital Events: Including procedures, antibiotic start and stop dates in addition to other pertinent events   7/22 Admitted for cardiogenic vs. Septic shock with decompensated HF and RLE cellulitis 8/4 RHC + Swan placement. Little improvement in clinical status despite milrinone and diuresis. PCCM consulted for worsening respiratory failure intraprocedure. Intubated. Zosyn started for possible HCAP  8/5 hemodynamics improved. Still w/ pulm edema but CXR improving. Continuing preload reduction/and inotrope support. Starting tubefeeds.  Unfortunately patient worsened yesterday evening.  Increased pressor requirements.  Low-grade temperatures.  Concern for worsening sepsis. 8/6 critically ill, increasing pressor requirements.  Worsening kidney function less urine output 8/7 tolerating CVVHD  Interim History / Subjective:   Critically ill intubated on mechanical life support, requiring CVVHD, vasopressors.  Objective:  Blood pressure 101/62, pulse (!) 102, temperature 98.6 F (37 C), resp. rate (!) 24, height 5\' 8"  (1.727 m), weight (!) 146.2 kg, SpO2 97 %. PAP: (48-80)/(19-42) 54/31 CVP:  [10 mmHg-22 mmHg] 14 mmHg PCWP:  [31 mmHg-36 mmHg] 35 mmHg CO:  [4.9 L/min-6.6 L/min] 5.1 L/min CI:  [  2 L/min/m2-2.6 L/min/m2] 2 L/min/m2  Vent Mode: PRVC FiO2 (%):  [50 %] 50 % Set Rate:  [24 bmp] 24 bmp Vt Set:  [550 mL] 550 mL PEEP:  [10 cmH20] 10 cmH20 Plateau Pressure:  [26 cmH20-35 cmH20] 35  cmH20   Intake/Output Summary (Last 24 hours) at 08/19/2020 0721 Last data filed at 08/19/2020 0700 Gross per 24 hour  Intake 4342.84 ml  Output 3846 ml  Net 496.84 ml   Filed Weights   08/17/20 0457 08/18/20 0500 08/19/20 0445  Weight: (!) 137.2 kg (!) 147.5 kg (!) 146.2 kg   Physical Examination:  General: Obese gentleman intubated on mechanical life support critically ill HENT NCAT, right IJ catheter, dialysis catheter, endotracheal tube in place Pulm bilateral mechanically ventilated breath sounds Heart: Regular rate rhythm, S1-S2 Abd soft, nontender nondistended Ext bilateral lower extremity edema, lower extremity wraps in place GU Foley in place, clear urine Neuro alert oriented following commands  Resolved Hospital Problem List   Sepsis secondary to RLE cellulitis s/p 14 d zyvox.   Assessment & Plan:   Acute hypoxic respiratory failure in the setting of decompensated HF W/ diffuse pulmonary edema  R/o HCAP/aspiration  Plan Continue full adult mechanical vent support PAD guidelines for sedation VAP bundle Diuresis held by cardiology Continue CVVHD for volume removal which will hopefully help with hypoxemia and lung compliance Continue Zosyn plus vancomycin, ?de-escalate antibiotics  Acute on chronic combined CHF w/ cardiogenic shock  Nonischemic cardiomyopathy  Echo 08/14/2020 with EF 20%, grade III diastolic dysfunction.  Not candidate for advanced HF interventions d/t body habitus  Plan Continue to titrate vasopressors to maintain mean artery pressure greater than 65 Unfortunately likely has poor prognosis related to cardiac recovery in this situation.  Atrial flutter, s/p ablation History of VT, s/p AICD placement History of PE/LLE DVT - LE dopplers negative for DVT Plan IV heparin plus amiodarone  Acute renal failure on chronic CKD stage III Concern for cardiorenal syndrome -Baseline Cr 1.9-2.6, worsening over the last several months. Cr on PCCM consult  ~3.0.  -scr improved w/ preload reduction and inotropes.  Plan Follow urine output, basic metabolic panel Continue CVVHD per nephrology  Acid base and Fluid and electrolyte imbalance: NAG metabolic acidosis, hypokalemia and hyponatremia  Plan Replace electrolytes as needed  Hyperglycemia Plan SSI  GOC - Not a candidate for advanced HF therapies due to body habitus and ileostomy status - Per HF team note, patient has spoken with palliative care previously and at present does not wish to reevaluate his decision for full code status/aggressive care   Best Practice (right click and "Reselect all SmartList Selections" daily)   Diet/type: tubefeeds DVT prophylaxis: systemic heparin GI prophylaxis: PPI Lines: Central line Foley:  Yes, and it is still needed Code Status:  full code Last date of multidisciplinary goals of care discussion [see above ]   This patient is critically ill with multiple organ system failure; which, requires frequent high complexity decision making, assessment, support, evaluation, and titration of therapies. This was completed through the application of advanced monitoring technologies and extensive interpretation of multiple databases. During this encounter critical care time was devoted to patient care services described in this note for 32 minutes.  Josephine Igo, DO [facility] 08/19/2020 7:21 AM

## 2020-08-20 DIAGNOSIS — I5043 Acute on chronic combined systolic (congestive) and diastolic (congestive) heart failure: Secondary | ICD-10-CM | POA: Diagnosis not present

## 2020-08-20 LAB — RENAL FUNCTION PANEL
Albumin: 1.6 g/dL — ABNORMAL LOW (ref 3.5–5.0)
Anion gap: 12 (ref 5–15)
BUN: 17 mg/dL (ref 6–20)
CO2: 20 mmol/L — ABNORMAL LOW (ref 22–32)
Calcium: 7.7 mg/dL — ABNORMAL LOW (ref 8.9–10.3)
Chloride: 103 mmol/L (ref 98–111)
Creatinine, Ser: 2.35 mg/dL — ABNORMAL HIGH (ref 0.61–1.24)
GFR, Estimated: 32 mL/min — ABNORMAL LOW (ref >60)
Glucose, Bld: 160 mg/dL — ABNORMAL HIGH (ref 70–99)
Phosphorus: 3.6 mg/dL (ref 2.5–4.6)
Potassium: 4.3 mmol/L (ref 3.5–5.1)
Sodium: 135 mmol/L (ref 135–145)

## 2020-08-20 LAB — COOXEMETRY PANEL
Carboxyhemoglobin: 0.8 % (ref 0.5–1.5)
Carboxyhemoglobin: 0.8 % (ref 0.5–1.5)
Methemoglobin: 0.9 % (ref 0.0–1.5)
Methemoglobin: 0.9 % (ref 0.0–1.5)
O2 Saturation: 62 %
O2 Saturation: 63.4 %
Total hemoglobin: 12.7 g/dL (ref 12.0–16.0)
Total hemoglobin: 14 g/dL (ref 12.0–16.0)

## 2020-08-20 LAB — CBC
HCT: 40.1 % (ref 39.0–52.0)
Hemoglobin: 12.5 g/dL — ABNORMAL LOW (ref 13.0–17.0)
MCH: 29.7 pg (ref 26.0–34.0)
MCHC: 31.2 g/dL (ref 30.0–36.0)
MCV: 95.2 fL (ref 80.0–100.0)
Platelets: 242 10*3/uL (ref 150–400)
RBC: 4.21 MIL/uL — ABNORMAL LOW (ref 4.22–5.81)
RDW: 16.9 % — ABNORMAL HIGH (ref 11.5–15.5)
WBC: 17.9 10*3/uL — ABNORMAL HIGH (ref 4.0–10.5)
nRBC: 3.4 % — ABNORMAL HIGH (ref 0.0–0.2)

## 2020-08-20 LAB — GLUCOSE, CAPILLARY
Glucose-Capillary: 150 mg/dL — ABNORMAL HIGH (ref 70–99)
Glucose-Capillary: 165 mg/dL — ABNORMAL HIGH (ref 70–99)
Glucose-Capillary: 127 mg/dL — ABNORMAL HIGH (ref 70–99)
Glucose-Capillary: 142 mg/dL — ABNORMAL HIGH (ref 70–99)
Glucose-Capillary: 152 mg/dL — ABNORMAL HIGH (ref 70–99)
Glucose-Capillary: 155 mg/dL — ABNORMAL HIGH (ref 70–99)

## 2020-08-20 LAB — MAGNESIUM: Magnesium: 2.4 mg/dL (ref 1.7–2.4)

## 2020-08-20 LAB — APTT: aPTT: 78 seconds — ABNORMAL HIGH (ref 24–36)

## 2020-08-20 LAB — HEPARIN LEVEL (UNFRACTIONATED): Heparin Unfractionated: 0.4 IU/mL (ref 0.30–0.70)

## 2020-08-20 LAB — TRIGLYCERIDES: Triglycerides: 81 mg/dL (ref <150)

## 2020-08-20 MED ORDER — ALBUTEROL SULFATE (2.5 MG/3ML) 0.083% IN NEBU: 2.5000 mg | INHALATION_SOLUTION | RESPIRATORY_TRACT | Status: DC | PRN

## 2020-08-20 MED ORDER — MIDAZOLAM HCL 2 MG/2ML IJ SOLN: 1.0000 mg | INTRAMUSCULAR | Status: DC | PRN

## 2020-08-20 MED ORDER — POLYVINYL ALCOHOL 1.4 % OP SOLN
1.0000 [drp] | Freq: Four times a day (QID) | OPHTHALMIC | Status: DC | PRN
  Filled 2020-08-20: qty 15

## 2020-08-20 MED ORDER — EPINEPHRINE HCL 5 MG/250ML IV SOLN IN NS: 3.0000 ug/min | INTRAVENOUS | Status: DC

## 2020-08-20 MED ORDER — SODIUM CHLORIDE 0.9 % IV SOLN
1.0000 g | Freq: Three times a day (TID) | INTRAVENOUS | Status: DC
  Administered 2020-08-20 (×2): 1 g via INTRAVENOUS
  Filled 2020-08-20 (×4): qty 1

## 2020-08-20 MED ORDER — ACETAMINOPHEN 650 MG RE SUPP: 650.0000 mg | Freq: Four times a day (QID) | RECTAL | Status: DC | PRN

## 2020-08-20 MED ORDER — EPINEPHRINE HCL 5 MG/250ML IV SOLN IN NS
INTRAVENOUS | Status: AC
  Administered 2020-08-20: 3 ug/min via INTRAVENOUS
  Filled 2020-08-20: qty 250

## 2020-08-20 MED ORDER — ALBUTEROL SULFATE (2.5 MG/3ML) 0.083% IN NEBU
INHALATION_SOLUTION | RESPIRATORY_TRACT | Status: AC
  Filled 2020-08-20: qty 3

## 2020-08-20 MED ORDER — GLYCOPYRROLATE 0.2 MG/ML IJ SOLN: 0.2000 mg | INTRAMUSCULAR | Status: DC | PRN

## 2020-08-20 MED ORDER — MIDAZOLAM-SODIUM CHLORIDE 100-0.9 MG/100ML-% IV SOLN
5.0000 mg/h | INTRAVENOUS | Status: DC
  Administered 2020-08-20: 5 mg/h via INTRAVENOUS
  Filled 2020-08-20: qty 100

## 2020-08-20 MED ORDER — MIDAZOLAM BOLUS VIA INFUSION (WITHDRAWAL LIFE SUSTAINING TX)
2.0000 mg | INTRAVENOUS | Status: DC | PRN
  Filled 2020-08-20: qty 2

## 2020-08-20 MED ORDER — DEXTROSE 5 % IV SOLN: INTRAVENOUS | Status: DC

## 2020-08-20 MED ORDER — DEXTROSE 5 % IV SOLN: 5.0000 mg/h | INTRAVENOUS | Status: DC

## 2020-08-20 MED ORDER — ALBUTEROL SULFATE (2.5 MG/3ML) 0.083% IN NEBU
2.5000 mg | INHALATION_SOLUTION | RESPIRATORY_TRACT | Status: DC
  Administered 2020-08-20 (×3): 2.5 mg via RESPIRATORY_TRACT
  Filled 2020-08-20 (×2): qty 3

## 2020-08-20 MED ORDER — ACETAMINOPHEN 325 MG PO TABS: 650.0000 mg | ORAL_TABLET | Freq: Four times a day (QID) | ORAL | Status: DC | PRN

## 2020-08-20 MED ORDER — GLYCOPYRROLATE 1 MG PO TABS
1.0000 mg | ORAL_TABLET | ORAL | Status: DC | PRN
  Filled 2020-08-20: qty 1

## 2020-08-20 MED ORDER — DIPHENHYDRAMINE HCL 50 MG/ML IJ SOLN: 25.0000 mg | INTRAMUSCULAR | Status: DC | PRN

## 2020-08-21 LAB — PATHOLOGIST SMEAR REVIEW

## 2020-08-22 LAB — CULTURE, BLOOD (ROUTINE X 2)
Culture: NO GROWTH
Culture: NO GROWTH
Special Requests: ADEQUATE
Special Requests: ADEQUATE

## 2020-08-25 ENCOUNTER — Other Ambulatory Visit (HOSPITAL_COMMUNITY): Payer: Self-pay | Admitting: Internal Medicine

## 2020-08-25 LAB — CULTURE, BLOOD (ROUTINE X 2)
Culture: NO GROWTH
Culture: NO GROWTH
Special Requests: ADEQUATE
Special Requests: ADEQUATE

## 2020-09-13 NOTE — Progress Notes (Signed)
Patient ID: Roberto SOUDER, male   DOB: 1965-02-06, 55 y.o.   MRN: [id number]     Advanced Heart Failure Rounding Note  PCP-Cardiologist: Glori Bickers, MD   Subjective:    Events - 8/5 Swan placed. Moved to ICU. Intubated.  - 8/6 septic physiology. - 8/6 CVVHD started.   Remains sedated on vent. Decompensated last night with progressive hypotension. Concern for sepsis vs worsening cardiogenic shock.  Now on NE 82, epi 5, VP 0.04 and milrinone 0.125. MAPs low 60s.   CVVHD even now. On vanc/meropenum   Swan #s RA 13 PA 58/32 PCWP 33 Thermo 4.9/1.9 SVR 820    Objective:   Weight Range: (!) 147.7 kg Body mass index is 49.51 kg/m.   Vital Signs:   Temp:  [96.98 F (36.1 C)-99.1 F (37.3 C)] 98.24 F (36.8 C) (08/08 0645) Pulse Rate:  [66-105] 98 (08/08 0752) Resp:  [17-25] 24 (08/08 0752) BP: (85-109)/(45-78) 105/56 (08/08 0752) SpO2:  [94 %-99 %] 95 % (08/08 0752) Arterial Line BP: (84-111)/(44-65) 93/47 (08/08 0645) FiO2 (%):  [50 %] 50 % (08/08 0752) Weight:  [147.7 kg] 147.7 kg (08/08 0500) Last BM Date: 08/18/20  Weight change: Filed Weights   08/18/20 0500 08/19/20 0445 09/11/2020 0500  Weight: (!) 147.5 kg (!) 146.2 kg (!) 147.7 kg    Intake/Output:   Intake/Output Summary (Last 24 hours) at 11-Sep-2020 0923 Last data filed at 09-11-2020 0700 Gross per 24 hour  Intake 4354.47 ml  Output 6714 ml  Net -2359.53 ml       Physical Exam   General:  Intubated/sedated. Ill-appearing HEENT: normal + ETT Neck: supple. RIJ swan  Carotids 2+ bilat; no bruits. No lymphadenopathy or thryomegaly appreciated. Cor: PMI nondisplaced. Regular rate & rhythm. No rubs, gallops or murmurs. Lungs: coarse Abdomen: obese soft, nontender, nondistended. No hepatosplenomegaly. No bruits or masses. + ileostomy site Extremities: no cyanosis, clubbing, rash, 3+ edema + UNNA boots Neuro:sedated on vent    Telemetry   Sinus 80-90s Personally reviewed   Labs     CBC Recent Labs    08/17/20 1650 08/17/20 2313 08/19/20 0325 08/19/20 1510 08/19/20 2330 11-Sep-2020 0330  WBC 18.4*   < > 18.6*  --   --  17.9*  NEUTROABS 12.4*  --   --   --   --   --   HGB 13.3   < > 12.6*   < > 14.3 12.5*  HCT 40.5   < > 39.9   < > 42.0 40.1  MCV 91.4   < > 93.9  --   --  95.2  PLT 350   < > 301  --   --  242   < > = values in this interval not displayed.    Basic Metabolic Panel Recent Labs    08/19/20 0325 08/19/20 1510 08/19/20 1604 08/19/20 2330 09/11/20 0330  NA 132*   < > 133* 137 135  K 4.3   < > 4.1 4.4 4.3  CL 103  --  102  --  103  CO2 17*  --  21*  --  20*  GLUCOSE 160*  --  154*  --  160*  BUN 35*  --  22*  --  17  CREATININE 3.03*  --  2.46*  --  2.35*  CALCIUM 7.9*  --  7.9*  --  7.7*  MG 2.2  --   --   --  2.4  PHOS 3.5  --  3.3  --  3.6   < > = values in this interval not displayed.    Liver Function Tests Recent Labs    08/19/20 1604 2020-08-21 0330  ALBUMIN 1.7* 1.6*     No results for input(s): LIPASE, AMYLASE in the last 72 hours. Cardiac Enzymes No results for input(s): CKTOTAL, CKMB, CKMBINDEX, TROPONINI in the last 72 hours.  BNP: BNP (last 3 results) Recent Labs    07/19/20 0222 07/23/20 1117 07/21/2020 1707  BNP 959.9* 992.0* 1,426.8*     ProBNP (last 3 results) No results for input(s): PROBNP in the last 8760 hours.   D-Dimer No results for input(s): DDIMER in the last 72 hours. Hemoglobin A1C No results for input(s): HGBA1C in the last 72 hours.  Fasting Lipid Panel Recent Labs    Aug 21, 2020 0330  TRIG 81    Thyroid Function Tests No results for input(s): TSH, T4TOTAL, T3FREE, THYROIDAB in the last 72 hours.  Invalid input(s): FREET3  Other results:   Imaging    No results found.   Medications:     Scheduled Medications:  albuterol  2.5 mg Nebulization Q4H   chlorhexidine gluconate (MEDLINE KIT)  15 mL Mouth Rinse BID   Chlorhexidine Gluconate Cloth  6 each Topical Daily    insulin aspart  0-15 Units Subcutaneous Q4H   mouth rinse  15 mL Mouth Rinse 10 times per day   mupirocin ointment  1 application Nasal BID   pantoprazole (PROTONIX) IV  40 mg Intravenous Daily   sodium chloride flush  10-40 mL Intracatheter Q12H   sodium chloride flush  10-40 mL Intracatheter Q12H    Infusions:   prismasol BGK 4/2.5 400 mL/hr at 08/19/20 1850    prismasol BGK 4/2.5 400 mL/hr at 08/19/20 1849   sodium chloride Stopped (08/17/20 2155)   amiodarone 30 mg/hr (08-21-2020 0700)   epinephrine 5 mcg/min (August 21, 2020 0700)   fentaNYL infusion INTRAVENOUS 350 mcg/hr (08-21-2020 0700)   heparin 2,450 Units/hr (August 21, 2020 0700)   meropenem (MERREM) IV Stopped (Aug 21, 2020 0549)   milrinone 0.125 mcg/kg/min (08/21/20 0700)   norepinephrine (LEVOPHED) Adult infusion 82 mcg/min (Aug 21, 2020 0700)   prismasol BGK 4/2.5 2,000 mL/hr at 08-21-2020 0601   vancomycin Stopped (2020-08-21 0011)   vasopressin 0.04 Units/min (2020/08/21 0700)    PRN Medications: sodium chloride, acetaminophen **OR** acetaminophen, fentaNYL, heparin, heparin, midazolam, ondansetron (ZOFRAN) IV, sodium chloride flush, sodium chloride flush   Assessment/Plan   Shock,combined cardiogenic/septic - Intubated and sedated after acute decompensation on 8/4 - Progressive deterioration last night - On vanc/meropenum for sepsis - Now on NE 82, epi 5 milrinone 0.25, VP 0.04. Swan numbers still marginal - He is critically ill. Hopes of survival now very small.  - Will keep CVVHD even. Continue aggressive care. - Will touch base again with Palliative Care for assistance with GOC as there is significant family turmoil among his children  - repeat limited echo   2. 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  - Echo 8/22 EF 20%, RV mild to moderately HK. Has medtronic ICD.  - Off GDMT  due to shock. CVVHD started 8/6 - Remains markedly volume overloaded. Unable to pull due to low BP. Keep even  - Prognosis extremely tenuous. Not a candidate for advanced therapies given size and ileostomy.Has met with palliative care, and remains full code for now,   3. Acute  hypoxemic respiratory failure - Intubated 8/5 in the setting of shock - Ventilator management per CCM    4. AKI on CKD stage 3b:  - Variable baseline creatinine 1.8-2.5.  - AKI due to ATN/shock - CVVHD started 08/18/20 - likely poor candidate for iHD  5. RLE cellulitis:  - Resolved  6. History of VT:  - ICD in place - Keep  K >4, Mg >2   7. History of LV thrombus:  - Switched from Eliquis to heparin drip due to AKI  - No evidence of bleeding. Hgb stable.  8. Atrial flutter s/p ablation:  - Continue Heparin drip  - on amio  9. Diverticulitis s/p ileostomy  10. Hyponatremia -Hypervolemic hyponatremia - NA 135  11. Ileus - due to shock. Hold TFs  CRITICAL CARE Performed by: Glori Bickers  Total critical care time: 50 minutes  Critical care time was exclusive of separately billable procedures and treating other patients.  Critical care was necessary to treat or prevent imminent or life-threatening deterioration.  Critical care was time spent personally by me (independent of midlevel providers or residents) on the following activities: development of treatment plan with patient and/or surrogate as well as nursing, discussions with consultants, evaluation of patient's response to treatment, examination of patient, obtaining history from patient or surrogate, ordering and performing treatments and interventions, ordering and review of laboratory studies, ordering and review of radiographic studies, pulse oximetry and re-evaluation of patient's condition.   Length of Stay: 16  Glori Bickers, MD  08-26-2020, 9:23 AM  Advanced Heart Failure Team Pager [phone] (M-F; 7a - 5p)  Please contact  [facility] for night-coverage after hours (5p -7a ) and weekends on [email or web address]

## 2020-09-13 NOTE — Progress Notes (Signed)
No ICM remote transmission received for 2020/09/02 due to hospitalization and next ICM transmission scheduled for 09/10/2020.

## 2020-09-13 NOTE — Progress Notes (Signed)
Patient ID: Roberto [name], male   DOB: May 20, 1965, 55 y.o.   MRN: [id number] [facility] Progress Note   Assessment/ Plan:   1.  Acute kidney injury on chronic kidney disease stage IIIb: Likely ATN associated with sepsis/cardiogenic shock with underlying cardiorenal syndrome.  Started on CRRT for volume unloading after refractory to diuretics while on pressors/inotropic support.   - Continue CRRT with UF goal keep even as tolerated - Note unfortunately overall prognosis poor with challenges in controlling his underlying congestive heart failure.  2.  Acute on chronic combined congestive heart failure: With cardiogenic shock and currently on pressor/inotropic support.  Started CRRT on 8/6 for extracorporeal volume unloading after poor response to diuretics.  Continue family discussions regarding goals of care based on poor prognosis.  3.  Acute hypoxic respiratory failure: Secondary to decompensated congestive heart failure/pulmonary edema.  On broad-spectrum antibiotic therapy and volume unloading with CRRT.  4.  Atrial flutter: On heparin drip and amiodarone, intermittent tachycardia noted.  5.  Hyponatremia: Improved with CRRT.  Secondary to CHF exacerbation and acute kidney injury with impaired free water handling-monitor with volume unloading with CRRT.   Subjective:   he has been on CRRT with 7.1 kg UF over 8/7 with CRRT.  He had 100 ml uop over 8/7.  Per nursing day shift able to pull goal of net neg 100-200 and was at the beginning of night shift but overnight he has been kept even or positive with pressor requirement.  He has been on epi at 5 mcg/min, levo at 82 mcg/min, on vaso and milrinone.  Filter changed once  Review of systems:  Unable to obtain 2/2 intubated and sedated    Objective:   BP 102/63   Pulse 99   Temp 98.24 F (36.8 C)   Resp (!) 24   Ht _0  (1.727 m)   Wt (!) 147.7 kg   SpO2 95%   BMI 49.51 kg/m   Intake/Output Summary (Last 24 hours)  at Sep 02, 2020 3536 Last data filed at 09/02/20 0600 Gross per 24 hour  Intake 4685.05 ml  Output 7482 ml  Net -2796.95 ml   Weight change: 1.5 kg  Physical Exam:   General adult male in bed critically ill  HEENT normocephalic atraumatic; eyes closed Neck trachea midline Lungs coarse mechanical breath sounds; FIO2 50 and PEEP 5   Heart S1S2 no rub Abdomen soft nontender with sedation; obese habitus Extremities 1+ edema with legs wrapped bilaterally Neuro - sedation currently running; does move toes to exam Access left IJ nontunneled dialysis catheter   Imaging: DG CHEST PORT 1 VIEW  Result Date: 08/19/2020 CLINICAL DATA:  Respiratory dependent. Evaluate bilateral airspace disease, edema. Pleural effusion. Right heart catheterization. EXAM: PORTABLE CHEST 1 VIEW COMPARISON:  August 18, 2020. FINDINGS: Similar position of the endotracheal tube tip at the clavicular heads. Endotracheal tube courses below the diaphragm with the tip outside the field of view. Right IJ Swan-Ganz catheter with the tip projecting in the expected region of the descending right pulmonary artery. Left IJ central venous catheter with the tip projecting expected region of the confluence of the left brachiocephalic vein and SVC. Mildly improved aeration lungs with right greater than left airspace opacities. No visible pneumothorax or large pleural effusion with evaluation limited on the single AP semi erect radiograph. Enlarged cardiac silhouette, similar to prior. Left subclavian approach single lead cardiac rhythm maintenance device. IMPRESSION: 1. Similar position of support devices, as detailed above. 2. Mildly improved aeration  of the lungs with right greater than left airspace opacities, which could represent asymmetric edema and/or pneumonia. 3. Similar cardiomegaly. Electronically Signed   By: Margaretha Sheffield MD   On: 08/19/2020 08:15   DG CHEST PORT 1 VIEW  Result Date: 08/18/2020 CLINICAL DATA:  Central line  placement EXAM: PORTABLE CHEST 1 VIEW COMPARISON:  08/18/2020 FINDINGS: Left AICD, endotracheal tube and NG tube remain in place, unchanged. Right Swan-Ganz catheter is in the descending right pulmonary artery. Left central line placement with the tip at the confluence of the innominate veins in the upper SVC. Diffuse bilateral airspace disease, worsening since prior study. Cardiomegaly. IMPRESSION: Left central line placement with the tip in the upper SVC. No pneumothorax. Cardiomegaly. Worsening bilateral airspace disease, right greater than left, could reflect edema or infection Electronically Signed   By: Rolm Baptise M.D.   On: 08/18/2020 17:24    Labs: BMET Recent Labs  Lab 08/17/20 0420 08/17/20 0446 08/17/20 1532 08/17/20 2313 08/18/20 0423 08/18/20 0429 08/18/20 1309 08/18/20 1535 08/19/20 0325 08/19/20 1510 08/19/20 1604 08/19/20 2330 18-Sep-2020 0330  NA 130*   < > 132*   < > 129*   < > 130* 134* 132* 137 133* 137 135  K 3.2*   < > 4.2   < > 3.9   < > 4.3 4.3 4.3 4.1 4.1 4.4 4.3  CL 101  --  102  --  101  --  103  --  103  --  102  --  103  CO2 15*  --  17*  --  15*  --  15*  --  17*  --  21*  --  20*  GLUCOSE 163*  --  182*  --  167*  --  178*  --  160*  --  154*  --  160*  BUN 54*  --  53*  --  56*  --  57*  --  35*  --  22*  --  17  CREATININE 3.60*  --  3.61*  --  3.93*  --  4.22*  --  3.03*  --  2.46*  --  2.35*  CALCIUM 8.5*  --  8.5*  --  8.2*  --  8.2*  --  7.9*  --  7.9*  --  7.7*  PHOS 3.8  --  4.3  --  4.0  --   --   --  3.5  --  3.3  --  3.6   < > = values in this interval not displayed.   CBC Recent Labs  Lab 08/15/20 0550 08/15/20 1051 09/07/2020 0646 08/23/2020 1728 08/17/20 0420 08/17/20 0446 08/17/20 1650 08/17/20 2313 08/18/20 0423 08/18/20 0429 08/19/20 0325 08/19/20 1510 08/19/20 2330 2020-09-18 0330  WBC 13.6*   < > 15.1*   < > 15.8*  --  18.4*  --  18.7*  --  18.6*  --   --  17.9*  NEUTROABS 10.5*  --  11.4*  --  11.0*  --  12.4*  --   --   --    --   --   --   --   HGB 13.3   < > 13.5   < > 12.7*   < > 13.3   < > 12.7*   < > 12.6* 14.3 14.3 12.5*  HCT 40.4   < > 42.6   < > 37.8*   < > 40.5   < > 39.6   < > 39.9 42.0  42.0 40.1  MCV 91.6   < > 92.6   < > 89.6  --  91.4  --  93.2  --  93.9  --   --  95.2  PLT 292   < > 351   < > 325  --  350  --  325  --  301  --   --  242   < > = values in this interval not displayed.    Medications:     albuterol  2.5 mg Nebulization Q4H   alteplase  2 mg Intracatheter Once   chlorhexidine gluconate (MEDLINE KIT)  15 mL Mouth Rinse BID   Chlorhexidine Gluconate Cloth  6 each Topical Daily   fentaNYL (SUBLIMAZE) injection  50 mcg Intravenous Once   insulin aspart  0-15 Units Subcutaneous Q4H   mouth rinse  15 mL Mouth Rinse 10 times per day   mupirocin ointment  1 application Nasal BID   pantoprazole (PROTONIX) IV  40 mg Intravenous Daily   sodium chloride flush  10-40 mL Intracatheter Q12H   sodium chloride flush  10-40 mL Intracatheter Q12H   Claudia Desanctis, MD August 24, 2020, 6:41 AM

## 2020-09-13 NOTE — Progress Notes (Signed)
Med[employer] rep at bedside turning off ICD.

## 2020-09-13 NOTE — Progress Notes (Signed)
PT Cancellation Note  Patient Details Name: Roberto [name] MRN: [id number] DOB: Jan 14, 1965   Cancelled Treatment:    Reason Eval/Treat Not Completed: Other (comment) (Will sign off due to transitioning to comfort care.) 08/16/2020  Jacinto Halim., PT Acute Rehabilitation Services [phone]  (pager) [phone]  (office)   Eliseo Gum Zira Helinski 08/19/2020, 2:58 PM

## 2020-09-13 NOTE — Progress Notes (Addendum)
NAME:  NUCHEM GRATTAN, MRN:  [id number], DOB:  09-Jun-1965, LOS: 16 ADMISSION DATE:  07/25/2020 CONSULTATION DATE:  September 05, 2020 REFERRING MD:  Bensimhon CHIEF COMPLAINT:  Acute respiratory failure   History of Present Illness:  55 year old man with PMHx significant for acute-on-chronic combined CHF, NICM, CAD, history of VT (s/p AICD placement 08/2018), Aflutter (on Eliquis), DVT/PE (LLE, provoked 2007), CKD stage IIIb (baseline ~2.0), diverticulitis (c/b perforated colon/nonhealing abdominal wound requiring skin grafting and ileostomy) who presented to [facility] 7/22 for RLE cellulitis.   Found to be in cardiogenic versus septic shock in the setting of decompensated HF and cellulitis. Advanced HF team was consulted and patient was started on milrinone and IV diuresis without significant improvement. Patient continued to have worsening cardiorenal syndrome with worsening renal failure and underwent RHC and Swan placement 8/4. During procedure, patient's respiratory status decompensated and patient was intubated. Transferred to [facility] CVICU.  PCCM consulted for management of respiratory failure and ICU admission in the setting of decompensated HF and cardiorenal syndrome.  Pertinent Medical History:  NICM, CAD, history of VT (s/p AICD placement 08/2018), Aflutter (on Eliquis), DVT/PE (LLE, provoked 2007), CKD stage IIIb (baseline ~2.0), diverticulitis (c/b perforated colon/nonhealing abdominal wound requiring skin grafting and ileostomy)   Significant Hospital Events: Including procedures, antibiotic start and stop dates in addition to other pertinent events   7/22 Admitted for cardiogenic vs. Septic shock with decompensated HF and RLE cellulitis 8/4 RHC + Swan placement. Little improvement in clinical status despite milrinone and diuresis. PCCM consulted for worsening respiratory failure intraprocedure. Intubated. Zosyn started for possible HCAP  8/5 hemodynamics improved. Still w/ pulm edema but CXR improving.  Continuing preload reduction/and inotrope support. Starting tubefeeds.  Unfortunately patient worsened yesterday evening.  Increased pressor requirements.  Low-grade temperatures.  Concern for worsening sepsis. 8/6 critically ill, increasing pressor requirements.  Worsening kidney function less urine output 8/7 tolerating CVVHD  Interim History / Subjective:  Started on epi yesterday No significant changes overnight  Objective:  Blood pressure 102/66, pulse 97, temperature 98.24 F (36.8 C), resp. rate (!) 24, height 5\' 8"  (1.727 m), weight (!) 147.7 kg, SpO2 95 %. PAP: (40-64)/(25-43) 43/39 CVP:  [9 mmHg-22 mmHg] 11 mmHg PCWP:  [32 mmHg-41 mmHg] 41 mmHg CO:  [4.5 L/min-5.4 L/min] 5.2 L/min CI:  [1.8 L/min/m2-2.2 L/min/m2] 2 L/min/m2  Vent Mode: PRVC FiO2 (%):  [50 %] 50 % Set Rate:  [24 bmp] 24 bmp Vt Set:  [470 mL-550 mL] 470 mL PEEP:  [5 cmH20-10 cmH20] 5 cmH20 Plateau Pressure:  [24 cmH20-34 cmH20] 24 cmH20   Intake/Output Summary (Last 24 hours) at 09/12/2020 0739 Last data filed at 08/27/2020 0700 Gross per 24 hour  Intake 4701.17 ml  Output 7380 ml  Net -2678.83 ml    Filed Weights   08/18/20 0500 08/19/20 0445 09/03/2020 0500  Weight: (!) 147.5 kg (!) 146.2 kg (!) 147.7 kg   Physical Examination:  General: critically ill, sedated  HENT: ETT, no secretions Cardiac: regular rate, irregular rhythm Pulm: mechanical breath sounds bilaterally Neuro: lethargic, awakens to voice, follows commands   Assessment & Plan:   Acute hypoxic respiratory failure requiring mechanical ventilation Vent day #4  Extubation will be complicated by difficulty with removing volume due to cardiogenic shock. Plan -continue full adult mechanical vent support -will need to try to continue to keep PEEP low to help with hypotension -sedation protocol  Cardiogenic shock Acute on chronic combined CHF w/ cardiogenic shock  Nonischemic cardiomyopathy with combined HF (EF 20%,  G3DD) Echo 08/14/2020  with EF 20%, grade III diastolic dysfunction.  Not candidate for advanced HF interventions d/t body habitus  Started on epi yesterday due to progressive shock physiology--MAPs in low 60s despite epi, levo @82 , vasopressin, and milrinone this morning. Co-ox 63, CVP 15 R/o septic shock  8/5 blood cultures no growth to date Plan -Management per heart failure team -Transitioned to merrem this morning, Continue vanc -Follow blood cultures  Atrial flutter, s/p ablation History of VT, s/p AICD placement History of PE/LLE DVT - LE dopplers negative for DVT Plan -IV heparin plus amiodarone  Cardiorenal syndrome on chronic CKD stage III Concern for cardiorenal syndrome Having to run net even today due to hypotension Plan Follow urine output, basic metabolic panel Continue CRRT per nephrology  Hyperglycemia Plan -SSI  GOC - Not a candidate for advanced HF therapies due to body habitus and ileostomy status. Unfortunately, shock phyiology has progressed over the past day.  -Discussed with Dr. who plans on revisiting GOC with patient and family today  Best Practice (right click and "Reselect all SmartList Selections" daily)   Diet/type: tubefeeds DVT prophylaxis: systemic heparin GI prophylaxis: PPI Lines: Central line, Dialysis Catheter, and Arterial Line, Foley, Gala Romney, NG Foley:  Yes, and it is still needed Code Status:  full code Last date of multidisciplinary goals of care discussion [see above ]  Theone Murdoch, MD Internal Medicine Resident PGY-3 [facility] Pager: [phone] 09/08/2020 10:20 AM   Atttending Note 09/04/2020  I saw and evaluated the patient. Discussed with resident and agree with resident's findings and plan as documented in the resident's note.  I have seen and evaluated the patient for shock, respiratory failure.  S:  Worsening pressor needs overnight.  Unable to pull any fluid.  Abx broadened.  O: Blood  pressure (!) 107/52, pulse 100, temperature 98.06 F (36.7 C), resp. rate (!) 24, height 5\' 8"  (1.727 m), weight (!) 147.7 kg, SpO2 94 %.  Ill appearing man on vent Ext lukewarm Ongoing CRRT Global anasarca Ileostomy in place Will wake up and follow commands  CXR R>L edema vs. Less likely PNA Coox 63% on multiple pressors/inotropes Stable leukocytosis Mild drop in plts Hgb stable  A:  Profound cardiogenic and septic shock deteriorating on maximal support Cardiorenal syndrome on CRRT Baseline severe NICM not candidate for advanced therapies VT with AICD in place Aflutter on [facility]  P:  Taking a turn for worse Not much left to be offered Continue vent support, broad spectrum abx, inotropes and pressors as ordered until family comes in and we can discuss how Mr. Makki would want to pass  34 min cc time  [facility] MD PCCM   Francene Finders MD [place] [facility] Prefer epic messenger for cross cover needs If after hours, please call E-link

## 2020-09-13 NOTE — Death Summary Note (Signed)
DEATH SUMMARY   Patient Details  Name: Roberto [name] MRN: [id number] DOB: [birth year]  Admission/Discharge Information   Admit Date:  August 21, 2020  Date of Death: Date of Death: 09-07-2020  Time of Death: Time of Death: Jun 07, 1613  Length of Stay: 06-16-22  Referring Physician: [name], MD   Reason(s) for Hospitalization  Acute on chronic combined systolic and diastolic heart failure RLE cellulitis present on admission Cardiogenic shock Cardiorenal syndrome Class 3 obesity History of PE AICD in place History of perforated diverticulitis s/p diverting ostomy and skin graft  Brief Hospital Course (including significant findings, care, treatment, and services provided and events leading to death)  55 year old man with PMHx significant for acute-on-chronic combined CHF, NICM, CAD, history of VT (s/p AICD placement 2018/09/08), Aflutter (on Eliquis), DVT/PE (LLE, provoked 06-07-05), CKD stage IIIb (baseline ~2.0), diverticulitis (c/b perforated colon/nonhealing abdominal wound requiring skin grafting and ileostomy) who presented to [facility] 22-Aug-2022 for RLE cellulitis.   Found to be in cardiogenic versus septic shock in the setting of decompensated HF and cellulitis. Advanced HF team was consulted and patient was started on milrinone and IV diuresis without significant improvement. Patient continued to have worsening cardiorenal syndrome with worsening renal failure and underwent RHC and Swan placement 8/4. During procedure, patient's respiratory status decompensated and patient was intubated. Transferred to [facility].   Patient remained unable to be diuresed so salvage CRRT was started.  His circulatory mechanics continued to deteriorate and he was started on vasopressors and broad spectrum antibiotics in case there was an element of septic shock clouding picture.  Despite all of these measures he continued to deteriorate so a family meeting was held.  Everyone agreed that patient would not want prolonged life  support given prognosis and he was allowed to pass in peace.  Pertinent Labs and Studies  Significant Diagnostic Studies DG Chest 2 View  Result Date: 21-Aug-2020 CLINICAL DATA:  Shortness of breath and cough for 2 weeks. EXAM: CHEST - 2 VIEW COMPARISON:  July 23, 2020 FINDINGS: A single lead ventricular pacer is noted. Mild to moderate severity diffusely increased interstitial lung markings are seen with prominence of the perihilar pulmonary vasculature. There is no evidence of a pleural effusion or pneumothorax. The cardiac silhouette is markedly enlarged. The visualized skeletal structures are unremarkable. IMPRESSION: Stable cardiomegaly with mild to moderate severity pulmonary edema. Electronically Signed   By: Aram Candela M.D.   On: Aug 21, 2020 19:30   DG Chest 2 View  Result Date: 07/23/2020 CLINICAL DATA:  Shortness of breath. EXAM: CHEST - 2 VIEW COMPARISON:  Single view of the chest 07/19/2020. FINDINGS: There is cardiomegaly and extensive bilateral airspace disease. No pneumothorax or pleural fluid. AICD is in place. No acute or focal bony abnormality. IMPRESSION: Cardiomegaly and extensive bilateral airspace disease is likely represents pulmonary edema. Electronically Signed   By: Drusilla Kanner M.D.   On: 07/23/2020 12:35   CARDIAC CATHETERIZATION  Result Date: 08/31/2020 Findings: On milrinone 0.375 mcg/kg/min RA = 9 RV = 53/11 PA = 60/29 (42) PCW = 35 (v = 39) Fick cardiac output/index = 3.8/1.5 Ao sat = 90% PA sat = 42% Assessment: Cardiogenic shock with markedly elevated filling pressures Plan/Discussion: Move to ICU. Start NE and lasix gtt. May need intubation and CVVHD Arvilla Meres, MD 11:05 PM   CT TIBIA FIBULA RIGHT WO CONTRAST  Result Date: 08/04/2020 CLINICAL DATA:  Osteomyelitis suspected. Discharge from small opening in the leg. EXAM: CT OF THE LOWER RIGHT EXTREMITY WITHOUT CONTRAST TECHNIQUE:  Multidetector CT imaging of the right lower extremity was performed  according to the standard protocol. COMPARISON:  None. FINDINGS: Bones/Joint/Cartilage Negative for fracture or erosion. Total knee arthroplasty without complicating features. Ligaments Suboptimally assessed by CT. Muscles and Tendons No visible muscle inflammation. No major tendon disruption/displacement at the ankle. Soft tissues Diffuse subcutaneous edema without visible collection and no soft tissue gas. IMPRESSION: 1. No evidence of osteomyelitis, abscess, or soft tissue emphysema. 2. Diffuse right leg edema. Electronically Signed   By: Marnee Spring M.D.   On: 08/04/2020 07:53   DG CHEST PORT 1 VIEW  Result Date: 08/19/2020 CLINICAL DATA:  Respiratory dependent. Evaluate bilateral airspace disease, edema. Pleural effusion. Right heart catheterization. EXAM: PORTABLE CHEST 1 VIEW COMPARISON:  August 18, 2020. FINDINGS: Similar position of the endotracheal tube tip at the clavicular heads. Endotracheal tube courses below the diaphragm with the tip outside the field of view. Right IJ Swan-Ganz catheter with the tip projecting in the expected region of the descending right pulmonary artery. Left IJ central venous catheter with the tip projecting expected region of the confluence of the left brachiocephalic vein and SVC. Mildly improved aeration lungs with right greater than left airspace opacities. No visible pneumothorax or large pleural effusion with evaluation limited on the single AP semi erect radiograph. Enlarged cardiac silhouette, similar to prior. Left subclavian approach single lead cardiac rhythm maintenance device. IMPRESSION: 1. Similar position of support devices, as detailed above. 2. Mildly improved aeration of the lungs with right greater than left airspace opacities, which could represent asymmetric edema and/or pneumonia. 3. Similar cardiomegaly. Electronically Signed   By: Feliberto Harts MD   On: 08/19/2020 08:15   DG CHEST PORT 1 VIEW  Result Date: 08/18/2020 CLINICAL DATA:  Central  line placement EXAM: PORTABLE CHEST 1 VIEW COMPARISON:  08/18/2020 FINDINGS: Left AICD, endotracheal tube and NG tube remain in place, unchanged. Right Swan-Ganz catheter is in the descending right pulmonary artery. Left central line placement with the tip at the confluence of the innominate veins in the upper SVC. Diffuse bilateral airspace disease, worsening since prior study. Cardiomegaly. IMPRESSION: Left central line placement with the tip in the upper SVC. No pneumothorax. Cardiomegaly. Worsening bilateral airspace disease, right greater than left, could reflect edema or infection Electronically Signed   By: Charlett Nose M.D.   On: 08/18/2020 17:24   DG Chest Port 1 View  Result Date: 08/18/2020 CLINICAL DATA:  55 year old male with history of respiratory failure. EXAM: PORTABLE CHEST 1 VIEW COMPARISON:  Chest x-ray 08/17/2020. FINDINGS: An endotracheal tube is in place with tip 3.5 cm above the carina. Right IJ Cordis through which a Swan-Ganz catheter has been passed into a descending right pulmonary artery branch. Left-sided pacemaker/AICD with lead tip projecting over the expected location of the right ventricular apex. A feeding tube is seen extending into the abdomen, however, the tip of the feeding tube extends below the lower margin of the image. There is cephalization of the pulmonary vasculature, indistinctness of the interstitial markings, and patchy airspace disease throughout the lungs bilaterally suggestive of moderate pulmonary edema. Small right pleural effusion. Heart size is moderately enlarged. Upper mediastinal contours are within normal limits allowing for patient positioning. IMPRESSION: 1. Support apparatus, as above. 2. The appearance the chest is favored to reflect congestive heart failure, as above. 3. Small right pleural effusion. Electronically Signed   By: Trudie Reed M.D.   On: 08/18/2020 08:51   DG Chest Port 1 View  Result Date: 08/17/2020  CLINICAL DATA:  Endotracheal  tube EXAM: PORTABLE CHEST 1 VIEW COMPARISON:  Radiograph 2020-08-24, chest CT 03/21/2017 FINDINGS: Endotracheal tube tip overlies the midthoracic trachea. Pulmonary artery catheter tip overlies the right inter lobar artery. Unchanged AICD lead. There is a nasogastric tube which passes below the diaphragm, side port overlying the stomach. Unchanged, enlarged cardiac silhouette. There is diffuse airspace disease bilaterally, minimally improved from prior exam. No large pleural effusion or visible pneumothorax. IMPRESSION: Endotracheal tube overlies the midthoracic trachea. Diffuse airspace disease bilaterally, minimally improved from prior exam. Electronically Signed   By: Caprice Renshaw   On: 08/17/2020 08:31   DG CHEST PORT 1 VIEW  Result Date: August 24, 2020 CLINICAL DATA:  Intubated, enteric catheter placement EXAM: PORTABLE CHEST 1 VIEW COMPARISON:  07/15/2020 FINDINGS: Single frontal view of the chest demonstrates endotracheal tube overlying tracheal air column tip midway between thoracic inlet and carina. Enteric catheter passes below diaphragm tip excluded by collimation. There is a right internal jugular flow directed central venous catheter tip overlying the right hilum. Right-sided PICC overlies superior vena cava. Single lead AICD unchanged. Cardiac silhouette is enlarged. There is widespread bilateral airspace disease, right greater than left. Small bilateral effusions. No pneumothorax. IMPRESSION: 1. Support devices as above. 2. Widespread bilateral airspace disease consistent with edema or infection. Electronically Signed   By: Sharlet Salina M.D.   On: 24-Aug-2020 19:14   DG Abd Portable 1V  Result Date: 08/17/2020 CLINICAL DATA:  Feeding tube placement EXAM: PORTABLE ABDOMEN - 1 VIEW COMPARISON:  None. FINDINGS: Feeding tube tip is in the distal stomach. Nonobstructive bowel gas pattern. Mild gaseous distention of the stomach. IMPRESSION: Feeding tube tip in the distal stomach. Electronically Signed    By: Charlett Nose M.D.   On: 08/17/2020 17:08   DG Abd Portable 1V  Result Date: 08-24-2020 CLINICAL DATA:  Enteric catheter placement EXAM: PORTABLE ABDOMEN - 1 VIEW COMPARISON:  01/02/2019 FINDINGS: Frontal view of the lower chest and upper abdomen demonstrates an enteric catheter tip and side port projecting over the gastric body. Paucity of bowel gas. No evidence of obstruction. Bibasilar airspace disease and small effusions, right greater than left. IMPRESSION: 1. Enteric catheter overlying gastric body. Electronically Signed   By: Sharlet Salina M.D.   On: 2020-08-24 19:15   ECHOCARDIOGRAM COMPLETE  Result Date: 08/14/2020    ECHOCARDIOGRAM REPORT   Patient Name:   Roberto [name] Date of Exam: 08/14/2020 Medical Rec #:  [id number]      Height:       68.0 in Accession #:    [id number]     Weight:       328.5 lb Date of Birth:  Apr 03, 1965      BSA:          2.524 m Patient Age:    54 years       BP:           97/78 mmHg Patient Gender: M              HR:           86 bpm. Exam Location:  Inpatient Procedure: 2D Echo, Cardiac Doppler and Color Doppler Indications:    CHF  History:        Patient has prior history of Echocardiogram examinations, most                 recent 12/14/2019. CHF and Cardiomyopathy; CAD.  Sonographer:    Eulah Pont RDCS Referring Phys: [phone] AMY D  CLEGG IMPRESSIONS  1. Left ventricular ejection fraction, by estimation, is 20%. The left ventricle has severely decreased function. The left ventricle demonstrates global hypokinesis. The left ventricular internal cavity size was moderately dilated. There is mild eccentric left ventricular hypertrophy. Left ventricular diastolic parameters are consistent with Grade III diastolic dysfunction (restrictive). Elevated left atrial pressure.  2. Right ventricular systolic function is mildly reduced. The right ventricular size is normal. There is moderately elevated pulmonary artery systolic pressure.  3. The mitral valve is normal in structure.  Moderate mitral valve regurgitation. No evidence of mitral stenosis.  4. Tricuspid valve regurgitation is moderate.  5. The aortic valve is grossly normal. Aortic valve regurgitation is not visualized. No aortic stenosis is present.  6. The inferior vena cava is dilated in size with <50% respiratory variability, suggesting right atrial pressure of 15 mmHg. Comparison(s): Prior images reviewed side by side. The left ventricular function is worsened. The right ventricular systolic function is worse. FINDINGS  Left Ventricle: Left ventricular ejection fraction, by estimation, is 20%. The left ventricle has severely decreased function. The left ventricle demonstrates global hypokinesis. The left ventricular internal cavity size was moderately dilated. There is  mild eccentric left ventricular hypertrophy. Left ventricular diastolic parameters are consistent with Grade III diastolic dysfunction (restrictive). Elevated left atrial pressure. Right Ventricle: The right ventricular size is normal. No increase in right ventricular wall thickness. Right ventricular systolic function is mildly reduced. There is moderately elevated pulmonary artery systolic pressure. The tricuspid regurgitant velocity is 2.86 m/s, and with an assumed right atrial pressure of 15 mmHg, the estimated right ventricular systolic pressure is 47.7 mmHg. Left Atrium: Left atrial size was normal in size. Right Atrium: Right atrial size was normal in size. Pericardium: There is no evidence of pericardial effusion. Mitral Valve: The mitral valve is normal in structure. Moderate mitral valve regurgitation, with eccentric posteriorly directed jet. No evidence of mitral valve stenosis. Tricuspid Valve: The tricuspid valve is normal in structure. Tricuspid valve regurgitation is moderate . No evidence of tricuspid stenosis. Aortic Valve: The aortic valve is grossly normal. Aortic valve regurgitation is not visualized. No aortic stenosis is present. Pulmonic  Valve: The pulmonic valve was grossly normal. Pulmonic valve regurgitation is not visualized. No evidence of pulmonic stenosis. Aorta: The aortic root is normal in size and structure. Venous: The inferior vena cava is dilated in size with less than 50% respiratory variability, suggesting right atrial pressure of 15 mmHg. IAS/Shunts: No atrial level shunt detected by color flow Doppler. Additional Comments: A device lead is visualized in the right ventricle.  LEFT VENTRICLE PLAX 2D LVIDd:         6.10 cm LVIDs:         5.50 cm LV PW:         1.20 cm LV IVS:        1.20 cm LVOT diam:     2.10 cm LV SV:         50 LV SV Index:   20 LVOT Area:     3.46 cm  LV Volumes (MOD) LV vol d, MOD A2C: 211.0 ml LV vol d, MOD A4C: 201.0 ml LV vol s, MOD A2C: 155.0 ml LV vol s, MOD A4C: 158.0 ml LV SV MOD A2C:     56.0 ml LV SV MOD A4C:     201.0 ml LV SV MOD BP:      49.7 ml RIGHT VENTRICLE RV S prime:     8.78 cm/s TAPSE (  M-mode): 1.9 cm LEFT ATRIUM              Index       RIGHT ATRIUM           Index LA diam:        4.70 cm  1.86 cm/m  RA Area:     15.00 cm LA Vol (A2C):   111.0 ml 43.99 ml/m RA Volume:   36.60 ml  14.50 ml/m LA Vol (A4C):   105.0 ml 41.61 ml/m LA Biplane Vol: 115.0 ml 45.57 ml/m  AORTIC VALVE LVOT Vmax:   109.00 cm/s LVOT Vmean:  62.280 cm/s LVOT VTI:    0.144 m  AORTA Ao Root diam: 3.00 cm Ao Asc diam:  2.60 cm MITRAL VALVE               TRICUSPID VALVE MV Area (PHT): 5.06 cm    TR Peak grad:   32.7 mmHg MV Decel Time: 150 msec    TR Vmax:        286.00 cm/s MV E velocity: 88.76 cm/s MV A velocity: 37.35 cm/s  SHUNTS MV E/A ratio:  2.38        Systemic VTI:  0.14 m                            Systemic Diam: 2.10 cm Rachelle Hora Croitoru MD Electronically signed by Thurmon Fair MD Signature Date/Time: 08/14/2020/4:46:36 PM    Final    CUP PACEART REMOTE DEVICE CHECK  Result Date: 07/30/2020 Scheduled remote reviewed. Normal device function.  Next remote 91 days.  VAS [place] LOWER EXTREMITY VENOUS  (DVT)  Result Date: 08/06/2020  Lower Venous DVT Study Patient Name:  RAYMONE RENZI [facility]  Date of Exam:   08/05/2020 Medical Rec #: [id number]       Accession #:    [id number] Date of Birth: [birth year]       Patient Gender: M Patient Age:   55Y Exam Location[facility]  [facility] Procedure:      VAS [place] LOWER EXTREMITY VENOUS (DVT) Referring Phys: [id number] [facility] J EZENDUKA --------------------------------------------------------------------------------  Indications: Swelling, Edema, and Erythema.  Limitations: Body habitus, Pitting edema and pain with compression. Comparison Study: Prior negative right LEV done 07/19/2020. Prior negative left                   LEV done 12/01/14 Performing Technologist: Sherren Kerns RVS  Examination Guidelines: A complete evaluation includes B-mode imaging, spectral Doppler, color Doppler, and power Doppler as needed of all accessible portions of each vessel. Bilateral testing is considered an integral part of a complete examination. Limited examinations for reoccurring indications may be performed as noted. The reflux portion of the exam is performed with the patient in reverse Trendelenburg.  +---------+---------------+---------+-----------+----------+-------------------+ RIGHT    CompressibilityPhasicitySpontaneityPropertiesThrombus Aging      +---------+---------------+---------+-----------+----------+-------------------+ CFV      Full                                         pulsatile waveforms +---------+---------------+---------+-----------+----------+-------------------+ SFJ      Full                                                             +---------+---------------+---------+-----------+----------+-------------------+  FV Prox  Full                                                             +---------+---------------+---------+-----------+----------+-------------------+ FV Mid   Full                                                              +---------+---------------+---------+-----------+----------+-------------------+ FV Distal                                             patent by color and                                                       Doppler             +---------+---------------+---------+-----------+----------+-------------------+ PFV      Full                                         pulsatile waveforms +---------+---------------+---------+-----------+----------+-------------------+ POP      Full                                                             +---------+---------------+---------+-----------+----------+-------------------+ PTV      Full                                                             +---------+---------------+---------+-----------+----------+-------------------+ PERO                                                  Not well visualized +---------+---------------+---------+-----------+----------+-------------------+   +---------+---------------+---------+-----------+----------+-------------------+ LEFT     CompressibilityPhasicitySpontaneityPropertiesThrombus Aging      +---------+---------------+---------+-----------+----------+-------------------+ CFV      Full                                         pulsatile waveforms +---------+---------------+---------+-----------+----------+-------------------+ SFJ      Full                                                             +---------+---------------+---------+-----------+----------+-------------------+  FV Prox  Full                                                             +---------+---------------+---------+-----------+----------+-------------------+ FV Mid   Full                                                             +---------+---------------+---------+-----------+----------+-------------------+ FV Distal               Yes      Yes                  patent by color and                                                        Doppler             +---------+---------------+---------+-----------+----------+-------------------+ PFV      Full                                                             +---------+---------------+---------+-----------+----------+-------------------+ POP                                                   patent by color and                                                       Doppler             +---------+---------------+---------+-----------+----------+-------------------+ PTV                                                   patent by color     +---------+---------------+---------+-----------+----------+-------------------+ PERO                                                  Not well visualized +---------+---------------+---------+-----------+----------+-------------------+     Summary: RIGHT: - There is no evidence of deep vein thrombosis in the lower extremity. However, portions of this examination were limited- see technologist comments above.  pulsatile waveforms suggestive of fluid overload  LEFT: - There is no evidence of deep vein thrombosis in the lower extremity.  However, portions of this examination were limited- see technologist comments above.  Pulsatile waveforms suggestive of fluid overload.  *See table(s) above for measurements and observations. Electronically signed by Fabienne Bruns MD on 08/06/2020 at 4:35:48 PM.    Final    [place] EKG SITE RITE  Result Date: 08/04/2020 If Site Rite image not attached, placement could not be confirmed due to current cardiac rhythm.   Microbiology Recent Results (from the past 240 hour(s))  MRSA Next Gen by PCR, Nasal     Status: Abnormal   Collection Time: 08/18/20 11:23 PM   Specimen: Nasal Mucosa; Nasal Swab  Result Value Ref Range Status   MRSA by PCR Next Gen DETECTED (A) NOT DETECTED Final    Comment: RESULT CALLED TO, READ BACK BY AND VERIFIED  WITH: J TOMLINSON,RN@[id number]  08/17/20 MK (NOTE) The GeneXpert MRSA Assay (FDA approved for NASAL specimens only), is one component of a comprehensive MRSA colonization surveillance program. It is not intended to diagnose MRSA infection nor to guide or monitor treatment for MRSA infections. Test performance is not FDA approved in patients less than 72 years old. Performed at [facility], [address], [place], [place] [address]   Culture, Respiratory w Gram Stain     Status: None   Collection Time: 18-Aug-2020 11:56 PM   Specimen: Tracheal Aspirate; Respiratory  Result Value Ref Range Status   Specimen Description TRACHEAL ASPIRATE  Final   Special Requests NONE  Final   Gram Stain   Final    NO WBC SEEN NO ORGANISMS SEEN Performed at [facility], [address] [place], [place] [address]    Culture RARE CANDIDA ALBICANS  Final   Report Status 08/19/2020 FINAL  Final  Culture, blood (routine x 2)     Status: None (Preliminary result)   Collection Time: 08/17/20  5:49 PM   Specimen: BLOOD  Result Value Ref Range Status   Specimen Description BLOOD LEFT ANTECUBITAL  Final   Special Requests AEROBIC BOTTLE ONLY Blood Culture adequate volume  Final   Culture   Final    NO GROWTH 2 DAYS Performed at [facility], [address] [place], [place] [address]    Report Status PENDING  Incomplete  Culture, blood (routine x 2)     Status: None (Preliminary result)   Collection Time: 08/17/20  5:57 PM   Specimen: BLOOD  Result Value Ref Range Status   Specimen Description BLOOD LEFT ANTECUBITAL  Final   Special Requests AEROBIC BOTTLE ONLY Blood Culture adequate volume  Final   Culture   Final    NO GROWTH 2 DAYS Performed at [facility], [address] [place], [place] [address]    Report Status PENDING  Incomplete    Lab Basic Metabolic Panel: Recent Labs  Lab 08/17/20 0420 08/17/20 0446 08/17/20 1532 08/17/20 2313 08/18/20 0423 08/18/20 0429  08/18/20 1309 08/18/20 1535 08/19/20 0325 08/19/20 1510 08/19/20 1604 08/19/20 2330 08/15/2020 0330  NA 130*   < > 132*   < > 129*   < > 130*   < > 132* 137 133* 137 135  K 3.2*   < > 4.2   < > 3.9   < > 4.3   < > 4.3 4.1 4.1 4.4 4.3  CL 101  --  102  --  101  --  103  --  103  --  102  --  103  CO2 15*  --  17*  --  15*  --  15*  --  17*  --  21*  --  20*  GLUCOSE 163*  --  182*  --  167*  --  178*  --  160*  --  154*  --  160*  BUN 54*  --  53*  --  56*  --  57*  --  35*  --  22*  --  17  CREATININE 3.60*  --  3.61*  --  3.93*  --  4.22*  --  3.03*  --  2.46*  --  2.35*  CALCIUM 8.5*  --  8.5*  --  8.2*  --  8.2*  --  7.9*  --  7.9*  --  7.7*  MG 2.0  2.1  --  1.8  --  1.8  --   --   --  2.2  --   --   --  2.4  PHOS 3.8  --  4.3  --  4.0  --   --   --  3.5  --  3.3  --  3.6   < > = values in this interval not displayed.   Liver Function Tests: Recent Labs  Lab 08/19/20 0325 08/19/20 1604 09/03/2020 0330  ALBUMIN 1.7* 1.7* 1.6*   No results for input(s): LIPASE, AMYLASE in the last 168 hours. No results for input(s): AMMONIA in the last 168 hours. CBC: Recent Labs  Lab 08/14/20 0415 08/15/20 0550 08/15/20 1051 09/08/2020 0646 09/06/2020 1728 08/17/20 0420 08/17/20 0446 08/17/20 1650 08/17/20 2313 08/18/20 0423 08/18/20 0429 08/18/20 1535 08/19/20 0325 08/19/20 1510 08/19/20 2330 2020-09-03 0330  WBC 14.7* 13.6*   < > 15.1*   < > 15.8*  --  18.4*  --  18.7*  --   --  18.6*  --   --  17.9*  NEUTROABS 11.3* 10.5*  --  11.4*  --  11.0*  --  12.4*  --   --   --   --   --   --   --   --   HGB 13.3 13.3   < > 13.5   < > 12.7*   < > 13.3   < > 12.7*   < > 13.9 12.6* 14.3 14.3 12.5*  HCT 39.9 40.4   < > 42.6   < > 37.8*   < > 40.5   < > 39.6   < > 41.0 39.9 42.0 42.0 40.1  MCV 90.9 91.6   < > 92.6   < > 89.6  --  91.4  --  93.2  --   --  93.9  --   --  95.2  PLT 285 292   < > 351   < > 325  --  350  --  325  --   --  301  --   --  242   < > = values in this interval not  displayed.   Cardiac Enzymes: No results for input(s): CKTOTAL, CKMB, CKMBINDEX, TROPONINI in the last 168 hours. Sepsis Labs: Recent Labs  Lab 09/11/2020 0646 08/23/2020 2050 08/17/20 1532 08/17/20 1650 08/17/20 2307 08/18/20 0423 08/19/20 0325 09/03/2020 0330  PROCALCITON 0.60  --  1.30  --   --  1.77 1.59  --   WBC 15.1*   < >  --  18.4*  --  18.7* 18.6* 17.9*  LATICACIDVEN  --   --   --   --  1.7 2.1*  --   --    < > =  values in this interval not displayed.   Lorin Glass 09/08/2020, 5:28 PM

## 2020-09-13 NOTE — Progress Notes (Addendum)
ANTICOAGULATION CONSULT NOTE  Pharmacy Consult for Heparin Indication: atrial fibrillation and LV thrombus  Allergies  Allergen Reactions   Shrimp [Shellfish Allergy] Hives and Itching    Patient Measurements: Height: 5\' 8"  (172.7 cm) Weight: (!) 147.7 kg (325 lb 9.9 oz) IBW/kg (Calculated) : 68.4 Heparin Dosing Weight: 104 kg  Vital Signs: Temp: 98.24 F (36.8 C) (08/08 0645) Temp Source: Core (08/08 0400) BP: 105/56 (08/08 0752) Pulse Rate: 98 (08/08 0752)  Labs: Recent Labs    08/18/20 0423 08/18/20 0429 08/19/20 0325 08/19/20 1510 08/19/20 1604 08/19/20 2330 09/07/2020 0330  HGB 12.7*   < > 12.6* 14.3  --  14.3 12.5*  HCT 39.6   < > 39.9 42.0  --  42.0 40.1  PLT 325  --  301  --   --   --  242  APTT 73*  --  66*  --   --   --  78*  HEPARINUNFRC 1.07*  --  0.63  --   --   --  0.40  CREATININE 3.93*   < > 3.03*  --  2.46*  --  2.35*   < > = values in this interval not displayed.     Estimated Creatinine Clearance: 50.9 mL/min (A) (by C-G formula based on SCr of 2.35 mg/dL (H)).   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   [employer] ICD   Arthritis    knee   Chronic combined systolic and diastolic CHF (congestive heart failure) (HCC)    Chronic venous insufficiency    Coronary artery disease    Deep vein thrombosis (HCC) 2007   left leg   Diverticulitis    c/b perforated colon and nonhealing abdominal wound s/p skin grafting, ileostomy   Hepatitis    patient denies this dx   Lupus anticoagulant positive    Morbid obesity (HCC)    Morbid obesity with BMI of 50.0-59.9, adult (HCC)    Mural thrombus of heart    coumadin   Nonischemic cardiomyopathy (HCC)    a) 12/17/11 echo: LVEF 25-30%, grade 3 diastolic dysfunction (c/w restriction), mod MR, mod LA/LV and mild RA dilatation; b) 12/18/11 cMRI: LVEF 38%, mod LV/mild RV dilatation, global HK, mild-mod RV sys dysfxn, no LV thrombus  & patchy non-subendocardial delayed enhancement c/w infil dz or prior myocarditis; c. 07/2018 EF 25-30%.   Paroxysmal SVT (supraventricular tachycardia) (HCC)    Pneumonia    x 2   Pulmonary embolism (HCC)    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   Ventricular tachyarrhythmia [facility])    Prior VT/VF   Wears glasses    Wound of abdomen     Medications:  Infusions:    prismasol BGK 4/2.5 400 mL/hr at 08/19/20 1850    prismasol BGK 4/2.5 400 mL/hr at 08/19/20 1849   sodium chloride Stopped (08/17/20 2155)   amiodarone 30 mg/hr (08/15/2020 0700)   epinephrine 5 mcg/min (08/24/2020 0700)   fentaNYL infusion INTRAVENOUS 350 mcg/hr (09/06/2020 0700)   heparin 2,450 Units/hr (09/05/2020 0700)   meropenem (MERREM) IV Stopped (08/24/2020 0549)   milrinone 0.125 mcg/kg/min (09/02/2020 0700)   norepinephrine (LEVOPHED) Adult infusion 82 mcg/min (09/04/2020 0700)   prismasol BGK 4/2.5 2,000 mL/hr at 08/18/2020 0601   vancomycin Stopped (08/30/2020 0011)   vasopressin 0.04 Units/min (08/14/2020 0700)    Assessment: 54 YOM on Apixaban PTA for hx LV thrombus/Afib admitted since 7/22 and with worsening AoCHF exacerbation and renal function -  s/p RHC 8/4 with resp distress requiring intubation. Pharmacy consulted to hold Apixaban and start Heparin for anticoagulation.   Heparin drip 2450 uts/hr aptt 78 sec at goal, hgb down from 14>12s ok no change.  Heparin levels and aptt appear to be correlated.  Goal of Therapy:  Heparin level 0.3-0.7 units/ml aPTT 66-102 seconds Monitor platelets by anticoagulation protocol: Yes   Plan:  -Continue heparin  2450 units/h -Recheck heparin level with am labs  Sheppard Coil PharmD., BCPS Clinical Pharmacist 08/26/2020 8:29 AM

## 2020-09-13 NOTE — Progress Notes (Signed)
Nutrition Brief Note ? ?Chart reviewed. ?Pt now transitioning to comfort care.  ?No further nutrition interventions planned at this time.  ?Please re-consult as needed.  ? ?Cate Amos Micheals MS, RDN, LDN, CNSC ?Registered Dietitian III ?Clinical Nutrition ?RD Pager and On-Call Pager Number Located in Amion  ? ? ?

## 2020-09-13 NOTE — Progress Notes (Signed)
Remote ICD transmission.   

## 2020-09-13 NOTE — Progress Notes (Signed)
Pt PEA on bedside monitor.  No heart sounds ausculted x 1 min and death pronounced by this RN and Michail Jewels RN

## 2020-09-13 NOTE — Progress Notes (Signed)
Pharmacy Antibiotic Note  Roberto [name] is a 55 y.o. male admitted on 08/11/2020, now w/ increasing pressor requirement >> concern for sepsis, already on ABX for r/o HCAP/aspiration.  Pharmacy has been consulted to change Zosyn to meropenem.  Pt on CRRT.  Plan: Meropenem 1g IV Q8H.  Height: 5\' 8"  (172.7 cm) Weight: (!) 146.2 kg (322 lb 5 oz) IBW/kg (Calculated) : 68.4  Temp (24hrs), Avg:98.1 F (36.7 C), Min:96.98 F (36.1 C), Max:98.96 F (37.2 C)  Recent Labs  Lab 08/13/20 0232 08/14/20 0415 08/23/2020 2050 08/17/20 0420 08/17/20 1532 08/17/20 1650 08/17/20 2307 08/18/20 0423 08/18/20 1309 08/19/20 0325 08/19/20 1604  WBC 14.7*   < > 16.7* 15.8*  --  18.4*  --  18.7*  --  18.6*  --   CREATININE 3.06*   < > 3.74* 3.60* 3.61*  --   --  3.93* 4.22* 3.03* 2.46*  LATICACIDVEN 1.4  --   --   --   --   --  1.7 2.1*  --   --   --    < > = values in this interval not displayed.    Estimated Creatinine Clearance: 48.3 mL/min (A) (by C-G formula based on SCr of 2.46 mg/dL (H)).    Allergies  Allergen Reactions   Shrimp [Shellfish Allergy] Hives and Itching    Thank you for allowing pharmacy to be a part of this patient's care.  10/19/20, PharmD, BCPS  03-Sep-2020 1:47 AM

## 2020-09-13 NOTE — Procedures (Signed)
Extubation Procedure Note  Patient Details:   Name: Roberto [name] DOB: 06/07/1965 MRN: [id number]   Airway Documentation:    Vent end date: 09/12/2020 Vent end time: 1539   Evaluation  O2 sats: currently acceptable Complications: No apparent complications Patient did tolerate procedure well. Bilateral Breath Sounds: Clear, Diminished   No  One way extubation  [name] 09/03/2020, 3:40 PM

## 2020-09-13 NOTE — Progress Notes (Signed)
Met with family, discussed terminal clinical course and impending cardiac arrest.  We agreed that Mr. Sahli would rather pass on his own terms without compressions, shocks etc.  Family will say their goodbyes, we will start versed drip with fentanyl and extubate Roberto [name] to pass in peace.  Erskine Emery MD PCCM

## 2020-09-13 NOTE — Progress Notes (Addendum)
RN received verbal orders form Dr Gala Romney to repeat Blood Cultures, change zosyn to meropenem and keep Vanc on. RN called Barkley Bruns, [facility] with these orders to make antibiotic changes.

## 2020-09-13 DEATH — deceased

## 2021-01-03 ENCOUNTER — Encounter: Payer: BC Managed Care – PPO | Admitting: Cardiology

## 2021-11-26 IMAGING — RF DG ABDOMEN 1V
1 series · 1 of 1 positions shown · non-contrast
Comparison: December 22, 2018

FLUOROSCOPY TIME:  1 minutes 0 seconds period 1 acquired image

CLINICAL DATA: Nasogastric tube placement

EXAM:
ABDOMEN - 1 VIEW

[Series 1: cp_standard · 0.17mm/px · 1 of 1 slices shown]
[im 1/1]
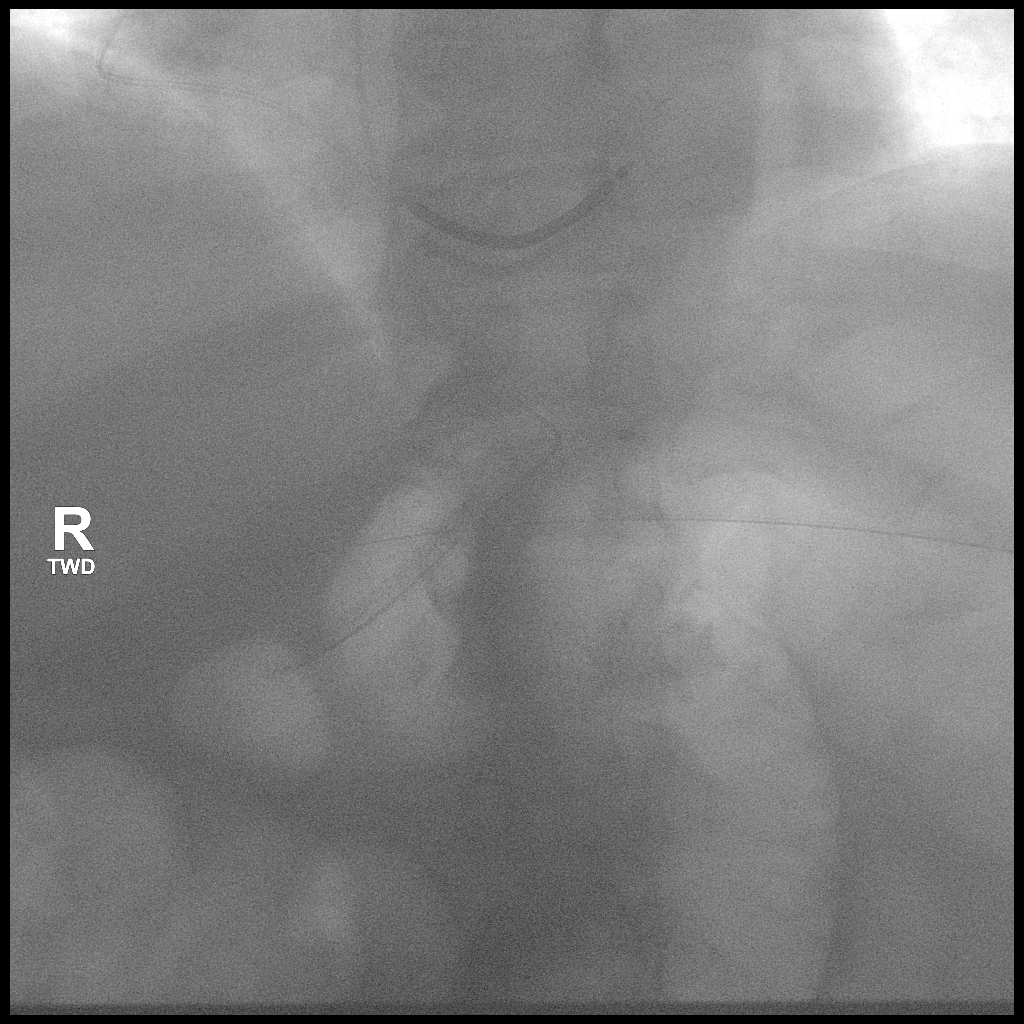

[1 of 1 positions shown; findings below may reference images not displayed]

FINDINGS: Nasogastric tube tip and side port in stomach. There are loops of
dilated bowel. No free air evident.
IMPRESSION: Nasogastric tube tip and side port in stomach. Persistent bowel
dilatation. No free air demonstrable.

## 2022-01-08 IMAGING — CT CT ABD-PELV W/ CM
2 of 4 series · 11 of 46 positions shown, 12 images · IV contrast (iopamidol)
Comparison: CT guided percutaneous drainage catheter
placement-01/04/2019

CT abdomen and pelvis-01/17/2019; 01/03/2019; 12/27/2018; 10/31/2018

CLINICAL DATA: History of colonic perforation complicated by
development of a postoperative abscess requiring percutaneous
drainage catheter placement on 01/04/2019. Subsequent CT scan of the
abdomen pelvis performed 01/17/2019 demonstrated a persistent fluid
collection along the right mid hemiabdomen.
TECHNIQUE: Multidetector CT imaging of the abdomen and pelvis was performed
using the standard protocol following bolus administration of
intravenous contrast.

CONTRAST:  100mL [id number] IOPAMIDOL ([id number]) INJECTION 61%

[Series 2: abd pelvis 5.00 br40 s3 axial · axial · 0.75mm/px · z∈[+1212,+1632]mm · 8 of 100 slices shown, 9 images]
[im 8/100  soft-tissue]
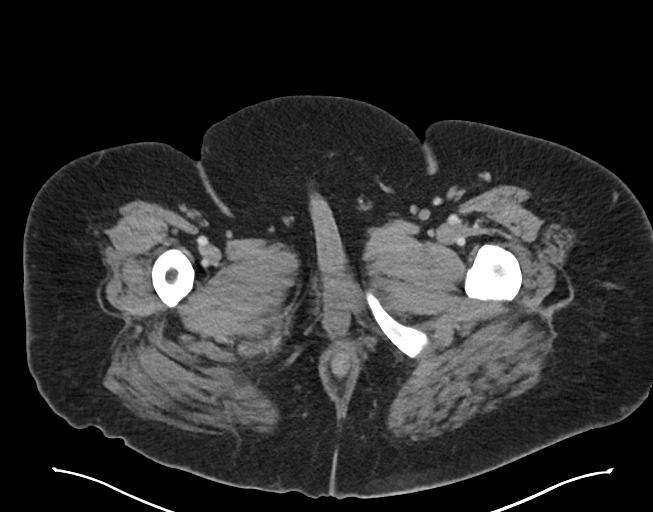
[im 8/100  bone]
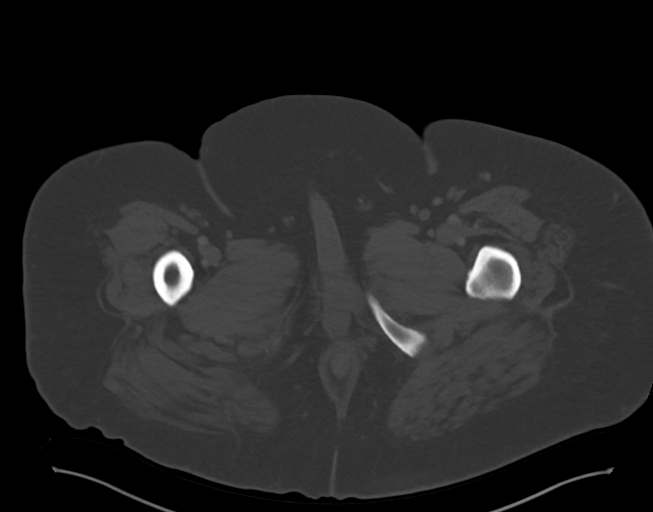
[im 20/100  soft-tissue]
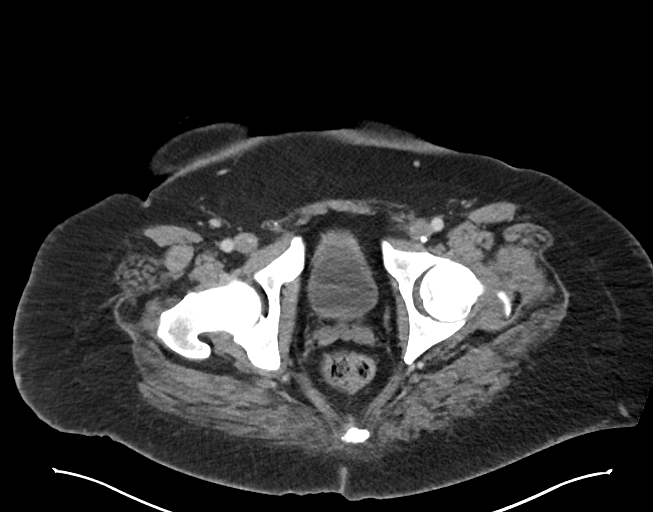
[im 32/100  soft-tissue]
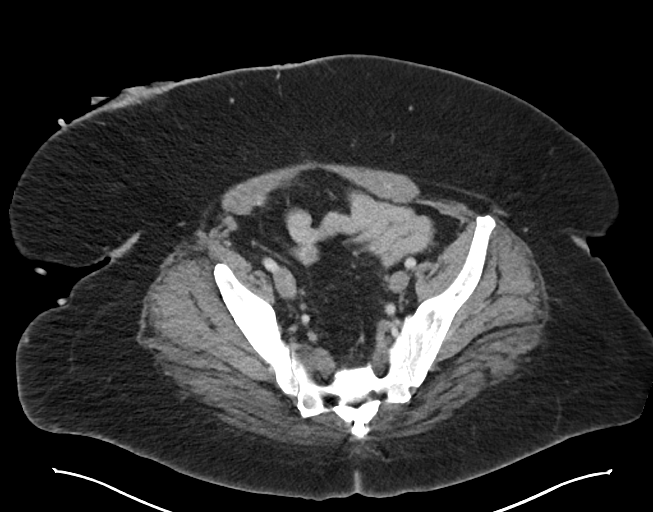
[im 44/100  soft-tissue]
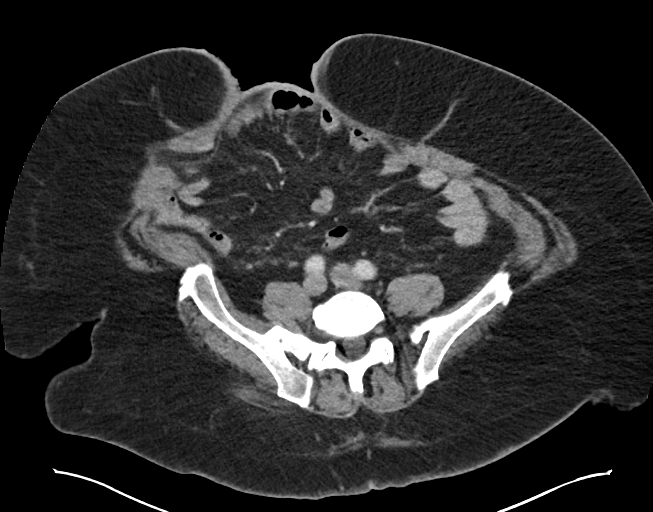
[im 56/100  soft-tissue]
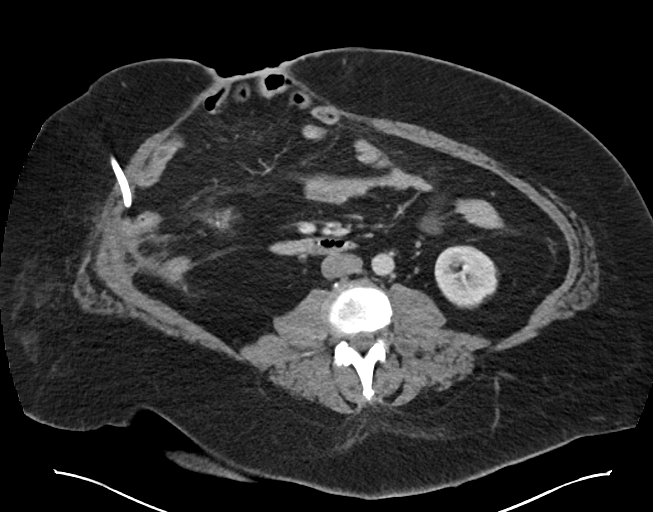
[im 68/100  soft-tissue]
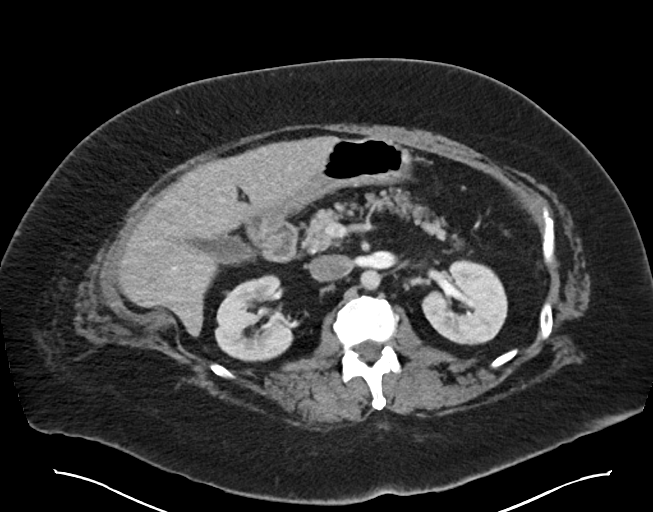
[im 80/100  soft-tissue]
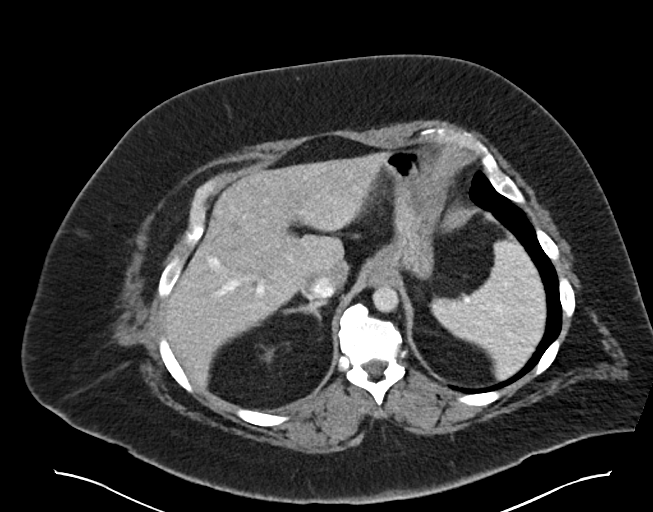
[im 92/100  soft-tissue]
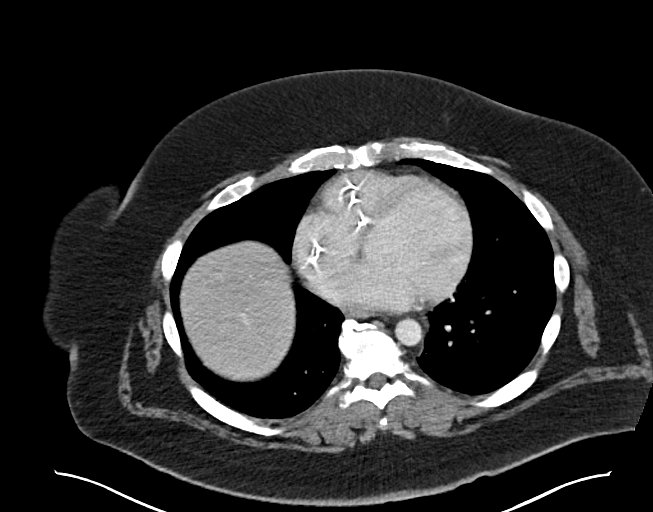

[Series 6: abd pelvis 2.00 br40 s3 cor · coronal · 0.96mm/px · 3 of 179 slices shown]
[im 60/179  soft-tissue]
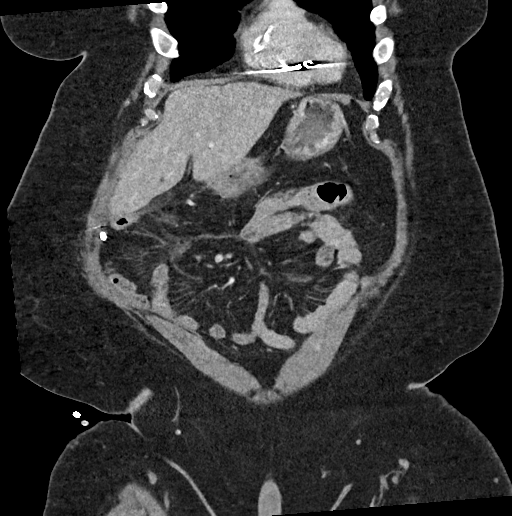
[im 80/179  soft-tissue]
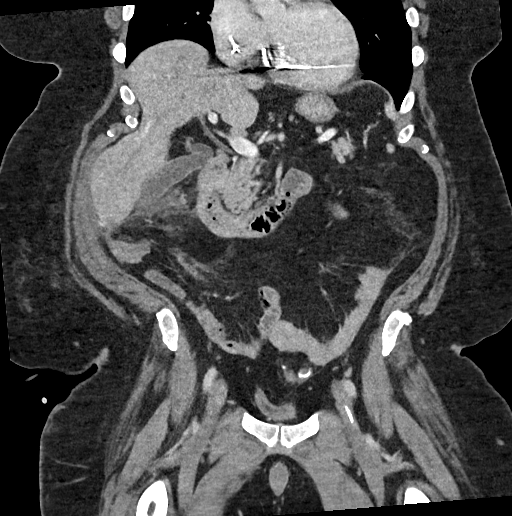
[im 99/179  soft-tissue]
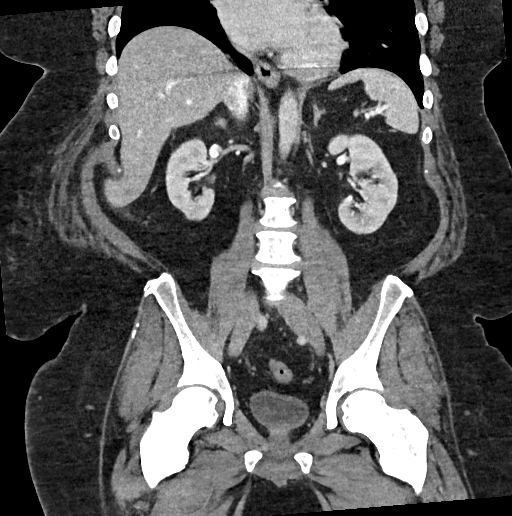

[11 of 46 positions shown; findings below may reference images not displayed]

Patient presents today to the [REDACTED]
for drainage catheter evaluation management.

Patient reports little to no output from the percutaneous drainage
catheter for the past several days though has not done a good job at
maintaining diligent records as he is currently at a facility. The
patient reports he has flushing the drainage catheter twice per day.

Patient denies worsening abdominal pain, fever or chills.

EXAM:
CT ABDOMEN AND PELVIS WITH CONTRAST
FINDINGS: Lower chest: Limited visualization of the lower thorax demonstrates
minimal subsegmental atelectasis within the imaged left lower lobe.
No discrete focal airspace opacities. No pleural effusion.

Normal heart size. Pacer leads terminate within the right atrium and
ventricle. No pericardial effusion.

Hepatobiliary: Normal hepatic contour. Previously characterized
hepatic hemangioma has significantly decreased in size compared to
remote abdominal CT performed 10/31/2018, currently measuring 3.7 x
3.5 cm, previously, 7.8 x 7.5 cm.

Scattered hypoattenuating hepatic cysts, unchanged. Additional
hypoattenuating hepatic lesions too small to accurately characterize
though favored to represent additional hepatic cysts. No discrete
worrisome hepatic lesions. There is a minimal amount stranding about
the gallbladder, likely reactive in the setting of adjacent
percutaneous drainage catheter. No radiopaque gallstones. No
discrete areas of gallbladder wall thickening. No intra extrahepatic
bili duct dilatation. There is a small amount of fluid about the
caudal aspect the right lobe of the liver.

Pancreas: Normal appearance of the pancreas.

Spleen: Normal appearance of the spleen. Note is made of a small
splenule.

Adrenals/Urinary Tract: There is symmetric enhancement of the
bilateral kidneys. No definite renal stones on this postcontrast
examination. Redemonstrated geographic atrophy involving the
inferior pole of the right kidney similar to the [DATE]
examination. No definite renal stones this postcontrast examination.
No discrete renal lesions. No urinary obstruction or perinephric
stranding.

Normal appearance the bilateral adrenal glands.

Normal appearance of the urinary bladder given underdistention.

Stomach/Bowel: Stable positioning of percutaneous drainage catheter
with end coiled and locked within the lateral aspect of the right
mid hemiabdomen. There has been interval reduction/resolution of
previously noted residual air and fluid with very small amount of
fluid seen about the cranial aspect of the percutaneous drainage
catheter without peripheral wall enhancement. No new
definable/drainable fluid collection within the abdomen or pelvis.

Stable sequela of subtotal colectomy with end ileostomy located
within the right lower abdomen. No evidence of enteric obstruction
or discrete areas of bowel wall thickening. No pneumoperitoneum,
pneumatosis or portal venous gas.

Vascular/Lymphatic: Normal caliber the abdominal aorta. The major
branch vessels of the abdominal aorta appear patent on this non CTA
examination.

Scattered porta hepatis lymph nodes are prominent though
individually not enlarged by size criteria with index gastrohepatic
ligament measuring 0.9 cm in greatest short axis diameter (image 25,
series 2, unchanged and presumably reactive in etiology. No bulky
retroperitoneal, mesenteric, pelvic or inguinal lymphadenopathy.

Reproductive: Normal appearance the prostate gland. No free fluid
within the pelvic cul-de-sac.

Other: Redemonstrated dehiscence of midline abdominal incision with
debris noted about the caudal aspect of the open wound (image 61,
series 2), without definable/drainable fluid collection.

Musculoskeletal: No acute or aggressive osseous abnormalities.
Diffuse increased sclerosis of the imaged osseous structures,
unchanged, nonspecific though could be seen in the setting renal
osteodystrophy.
IMPRESSION: 1. Interval reduction/resolution of air and fluid containing
collection along the right mid hemiabdomen following percutaneous
drainage catheter placement. No new definable/drainable fluid
collections within the abdomen or pelvis.
2. Redemonstrated dehiscence of midline abdominal incision with
small amount of debris about the caudal aspect of the open incision
without definable/drainable fluid collection.
3. Stable sequela of subtotal colectomy and end ileostomy without
evidence of enteric obstruction.
4. Significant involution previously characterized hepatic
hemangioma compared to remote examination performed 1373, currently
measuring 3.7 cm, previously, 7.8 cm.

PLAN:
Patient subsequently underwent percutaneous drainage catheter
injection

## 2022-01-08 IMAGING — RF DG SINUS / FISTULA TRACT / ABSCESSOGRAM
2 series · 7 of 7 positions shown · non-contrast
Comparison: CT abdomen pelvis-earlier same day;

CLINICAL DATA: History of colonic perforation complicated by
development of a postoperative abscess requiring percutaneous
drainage catheter placement on 01/04/2019.
TECHNIQUE: The patient was positioned supine on the fluoroscopy table.

[Series 1: sequence · 4 of 35 frames shown]
[frame 6/35]
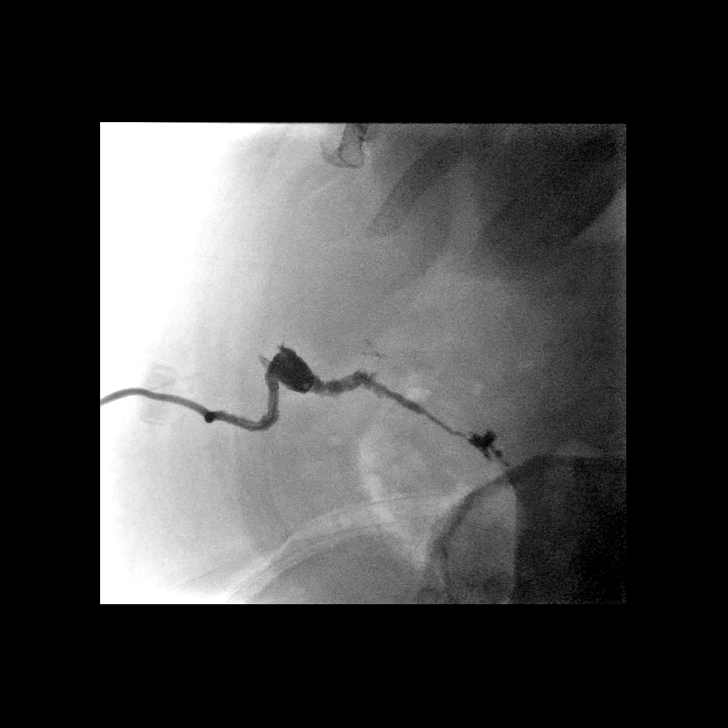
[frame 18/35]
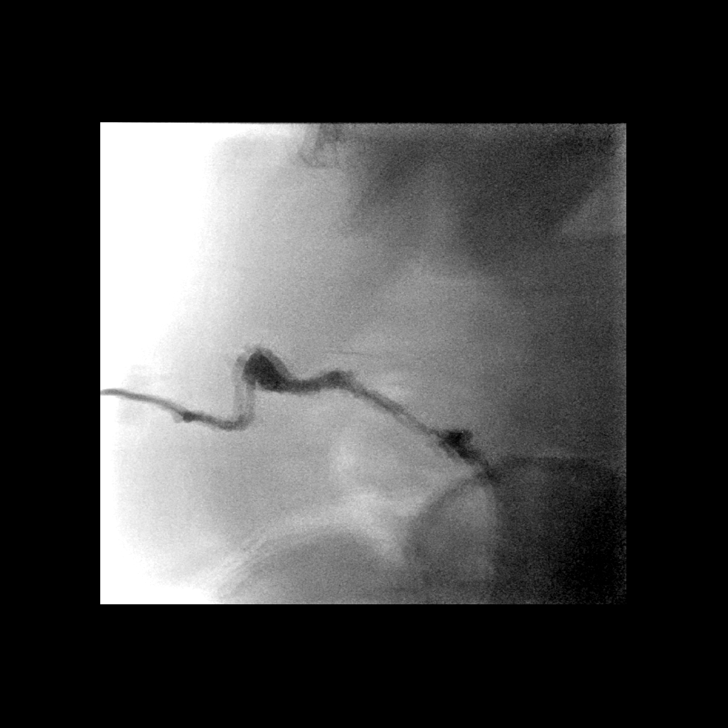
[frame 20/35]
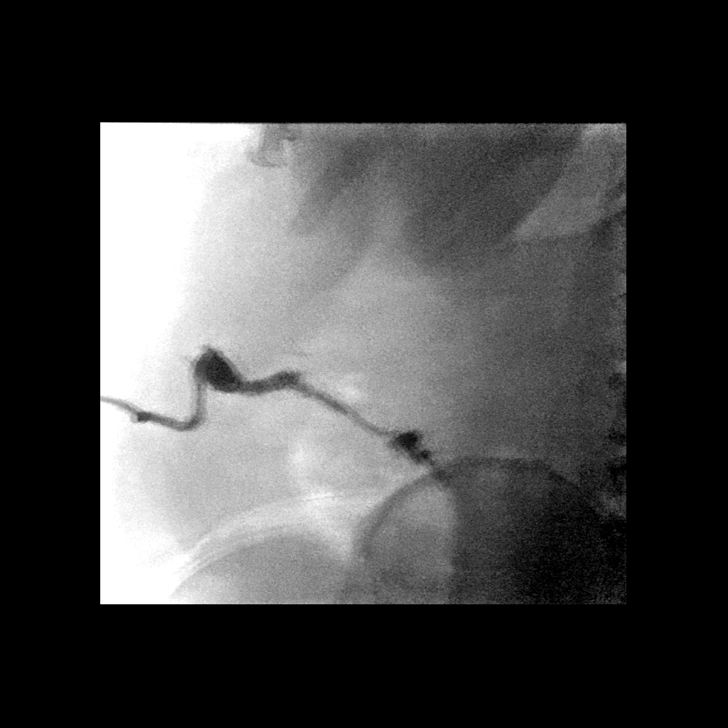
[frame 30/35]
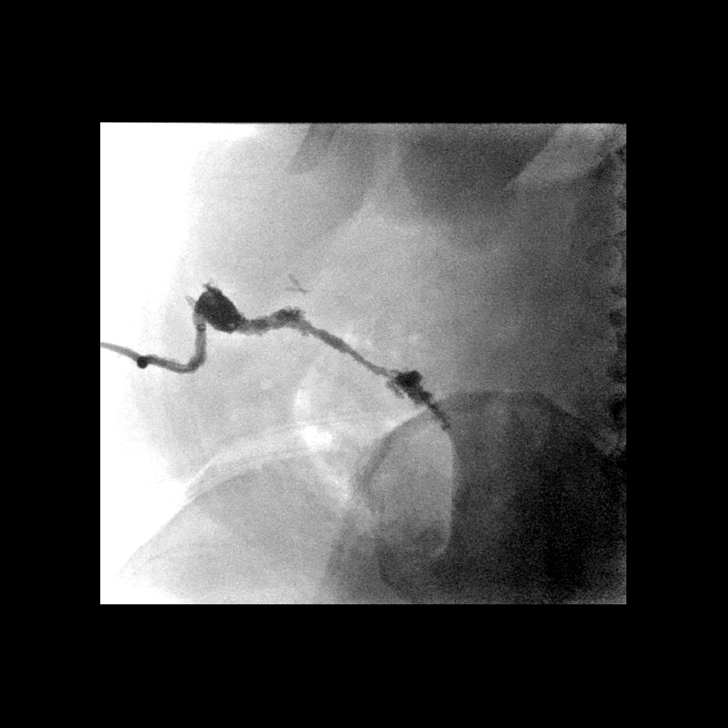

[Series 2: one shot · 0.14mm/px · 3 of 3 slices shown]
[im 1/3]
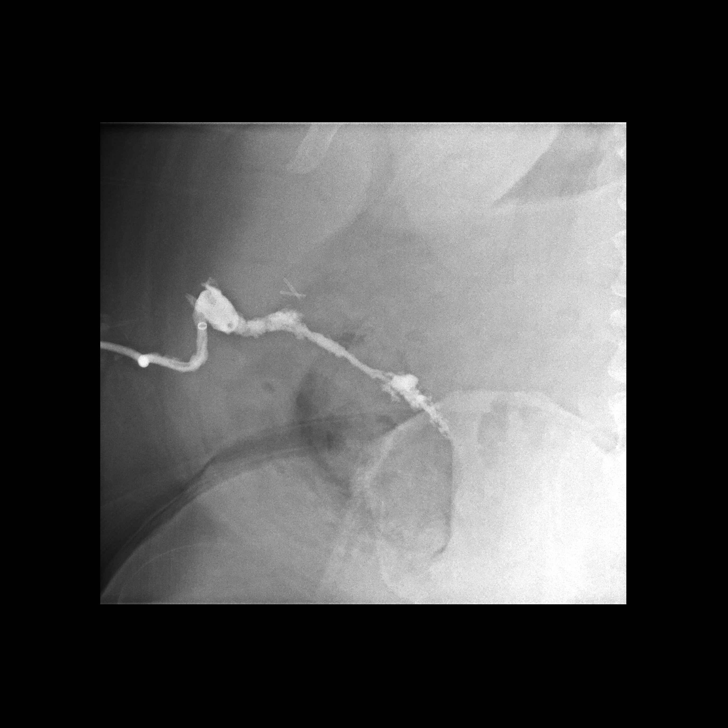
[im 2/3]
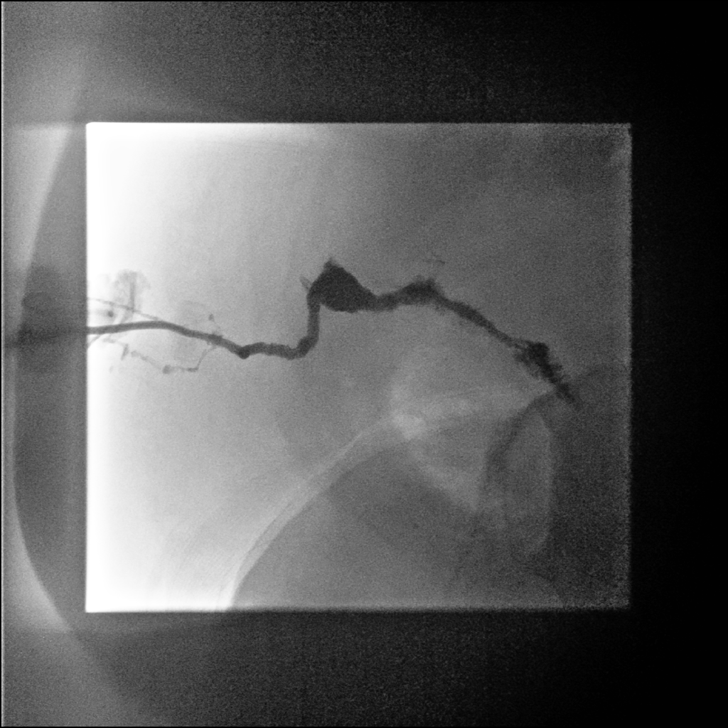
[im 3/3]
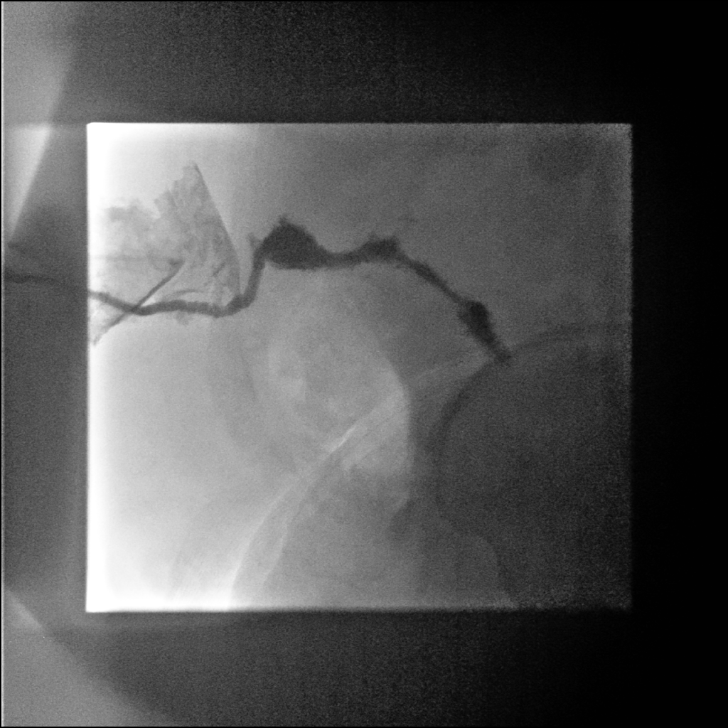

[7 of 7 positions shown; findings below may reference images not displayed]

CT scan performed at [REDACTED] earlier today demonstrates near
complete resolution of the tiny residual air and fluid collection.

Patient reports little to no output from the percutaneous catheter
for the past several days, though has not done a good job at
maintain diligent records regarding drainage catheter output as he
is currently located at a facility. The patient reports flushing the
drainage catheter twice per day.

Patient denies worsening abdominal pain, fever or chills.

Patient now presents for drainage catheter injection.

EXAM:
ABSCESS INJECTION
01/17/2019;
01/03/2019; CT-guided percutaneous catheter placement-01/04/2019

CONTRAST:  20 cc Omnipaque 300-administered via the existing
percutaneous drain.

FLUOROSCOPY TIME:  42 seconds (80 mGy)
A preprocedural spot fluoroscopic image was obtained of the right
mid hemiabdomen and the existing percutaneous drainage catheter.

Multiple spot fluoroscopic and radiographic images were obtained
following the injection of a small amount of contrast via the
existing percutaneous drainage catheter.

Images were reviewed and the decision was made to remove the
percutaneous drainage catheter.

The external portion of the drainage catheter was cut and drainage
catheters removed intact. A dressing was applied. The patient
tolerated the procedure well without immediate postprocedural
complication.
FINDINGS: Drainage catheter injection demonstrates opacification of the
decompressed abscess cavity with extension of contrast along the
right pararenal space but without definitive evidence of an enteric
fistula.

There is reflux of contrast along the catheter tract to the skin
entrance site.
IMPRESSION: Drainage catheter injection is negative for the presence of an
enteric fistula. As such, the drainage catheter was removed at the
patient's bedside without incident.

## 2023-05-02 IMAGING — DX DG CHEST 1V PORT
1 series · 1 of 1 positions shown · non-contrast
Comparison: Portable chest 05/24/2020 and earlier.

CLINICAL DATA: 54-year-old male with shortness of breath and
syncope.

EXAM:
PORTABLE CHEST 1 VIEW

[chest ap]
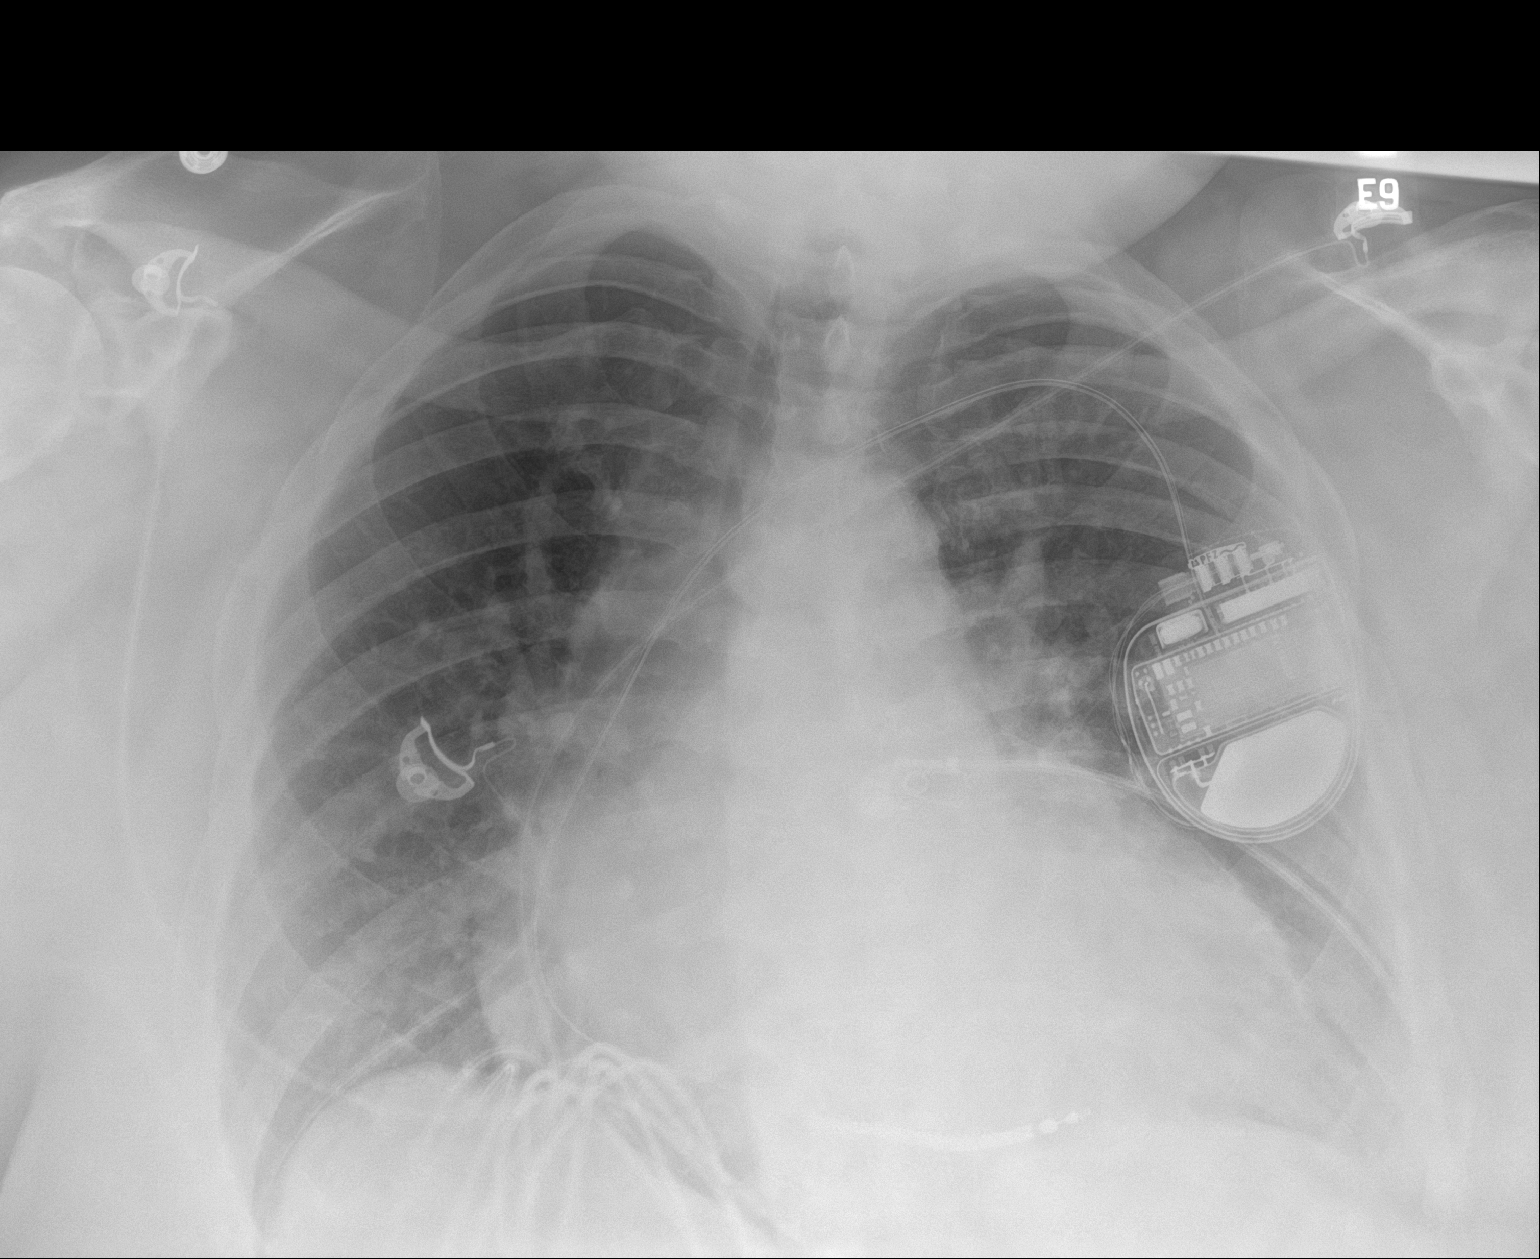

[1 of 1 positions shown; findings below may reference images not displayed]

FINDINGS: Portable AP semi upright view at and 2841 hours. Stable cardiomegaly
and mediastinal contours. Stable left chest AICD. Decreased
pulmonary vascularity. Allowing for portable technique the lungs are
clear. No pneumothorax or pleural effusion. Visualized tracheal air
column is within normal limits. No acute osseous abnormality
identified.
IMPRESSION: Pulmonary edema appears resolved since 05/24/2020. Cardiomegaly with
no acute cardiopulmonary abnormality.
